# Patient Record
Sex: Female | Born: 1955
Health system: Southern US, Community
[De-identification: ages and names within clinical notes are randomized; demographics above are authoritative.]

## PROBLEM LIST (undated history)

## (undated) DIAGNOSIS — I5022 Chronic systolic (congestive) heart failure: Secondary | ICD-10-CM

## (undated) DIAGNOSIS — I5020 Unspecified systolic (congestive) heart failure: Secondary | ICD-10-CM

## (undated) DIAGNOSIS — K219 Gastro-esophageal reflux disease without esophagitis: Secondary | ICD-10-CM

## (undated) DIAGNOSIS — E119 Type 2 diabetes mellitus without complications: Secondary | ICD-10-CM

## (undated) DIAGNOSIS — I10 Essential (primary) hypertension: Secondary | ICD-10-CM

## (undated) DIAGNOSIS — E785 Hyperlipidemia, unspecified: Secondary | ICD-10-CM

## (undated) DIAGNOSIS — I729 Aneurysm of unspecified site: Secondary | ICD-10-CM

## (undated) DIAGNOSIS — F419 Anxiety disorder, unspecified: Secondary | ICD-10-CM

## (undated) DIAGNOSIS — I2510 Atherosclerotic heart disease of native coronary artery without angina pectoris: Secondary | ICD-10-CM

## (undated) DIAGNOSIS — G459 Transient cerebral ischemic attack, unspecified: Secondary | ICD-10-CM

## (undated) DIAGNOSIS — D649 Anemia, unspecified: Secondary | ICD-10-CM

## (undated) DIAGNOSIS — M1990 Unspecified osteoarthritis, unspecified site: Secondary | ICD-10-CM

## (undated) HISTORY — PX: TUBAL LIGATION: SHX77

## (undated) HISTORY — DX: Anxiety disorder, unspecified: F41.9

## (undated) HISTORY — DX: Atherosclerotic heart disease of native coronary artery without angina pectoris: I25.10

## (undated) HISTORY — DX: Aneurysm of unspecified site: I72.9

## (undated) HISTORY — DX: Chronic systolic (congestive) heart failure: I50.22

## (undated) HISTORY — PX: BACK SURGERY: SHX140

## (undated) HISTORY — DX: Unspecified systolic (congestive) heart failure: I50.20

## (undated) HISTORY — PX: LAPAROSCOPIC CHOLECYSTECTOMY: SUR755

## (undated) HISTORY — DX: Gastro-esophageal reflux disease without esophagitis: K21.9

## (undated) HISTORY — PX: ANTERIOR CERVICAL DECOMP/DISCECTOMY FUSION: SHX1161

---

## 1998-04-12 ENCOUNTER — Encounter: Admission: RE | Admit: 1998-04-12 | Discharge: 1998-04-12 | Payer: Self-pay | Admitting: *Deleted

## 1998-06-06 ENCOUNTER — Emergency Department (HOSPITAL_COMMUNITY): Admission: EM | Admit: 1998-06-06 | Discharge: 1998-06-06 | Payer: Self-pay | Admitting: Emergency Medicine

## 1999-06-02 ENCOUNTER — Emergency Department (HOSPITAL_COMMUNITY): Admission: EM | Admit: 1999-06-02 | Discharge: 1999-06-02 | Payer: Self-pay | Admitting: Emergency Medicine

## 1999-06-03 ENCOUNTER — Encounter: Payer: Self-pay | Admitting: Emergency Medicine

## 2001-04-10 ENCOUNTER — Emergency Department (HOSPITAL_COMMUNITY): Admission: EM | Admit: 2001-04-10 | Discharge: 2001-04-10 | Payer: Self-pay | Admitting: Emergency Medicine

## 2001-04-10 ENCOUNTER — Encounter: Payer: Self-pay | Admitting: Emergency Medicine

## 2001-04-22 ENCOUNTER — Encounter: Payer: Self-pay | Admitting: Emergency Medicine

## 2001-04-22 ENCOUNTER — Emergency Department (HOSPITAL_COMMUNITY): Admission: EM | Admit: 2001-04-22 | Discharge: 2001-04-22 | Payer: Self-pay | Admitting: Emergency Medicine

## 2001-08-15 ENCOUNTER — Emergency Department (HOSPITAL_COMMUNITY): Admission: EM | Admit: 2001-08-15 | Discharge: 2001-08-15 | Payer: Self-pay | Admitting: Emergency Medicine

## 2001-08-15 ENCOUNTER — Encounter: Payer: Self-pay | Admitting: Emergency Medicine

## 2001-09-01 ENCOUNTER — Encounter: Admission: RE | Admit: 2001-09-01 | Discharge: 2001-10-16 | Payer: Self-pay | Admitting: *Deleted

## 2001-11-06 ENCOUNTER — Emergency Department (HOSPITAL_COMMUNITY): Admission: EM | Admit: 2001-11-06 | Discharge: 2001-11-06 | Payer: Self-pay | Admitting: Emergency Medicine

## 2001-11-06 ENCOUNTER — Encounter: Payer: Self-pay | Admitting: Emergency Medicine

## 2001-12-03 ENCOUNTER — Encounter: Payer: Self-pay | Admitting: Gastroenterology

## 2001-12-03 ENCOUNTER — Ambulatory Visit (HOSPITAL_COMMUNITY): Admission: RE | Admit: 2001-12-03 | Discharge: 2001-12-03 | Payer: Self-pay | Admitting: Gastroenterology

## 2001-12-12 ENCOUNTER — Emergency Department (HOSPITAL_COMMUNITY): Admission: EM | Admit: 2001-12-12 | Discharge: 2001-12-12 | Payer: Self-pay | Admitting: Emergency Medicine

## 2001-12-18 ENCOUNTER — Encounter: Payer: Self-pay | Admitting: Surgery

## 2001-12-22 ENCOUNTER — Ambulatory Visit (HOSPITAL_COMMUNITY): Admission: RE | Admit: 2001-12-22 | Discharge: 2001-12-23 | Payer: Self-pay | Admitting: Surgery

## 2001-12-22 ENCOUNTER — Encounter (INDEPENDENT_AMBULATORY_CARE_PROVIDER_SITE_OTHER): Payer: Self-pay | Admitting: *Deleted

## 2002-05-15 ENCOUNTER — Emergency Department (HOSPITAL_COMMUNITY): Admission: EM | Admit: 2002-05-15 | Discharge: 2002-05-15 | Payer: Self-pay | Admitting: Emergency Medicine

## 2002-05-15 ENCOUNTER — Encounter: Payer: Self-pay | Admitting: Emergency Medicine

## 2002-06-02 ENCOUNTER — Encounter: Payer: Self-pay | Admitting: Emergency Medicine

## 2002-06-02 ENCOUNTER — Emergency Department (HOSPITAL_COMMUNITY): Admission: EM | Admit: 2002-06-02 | Discharge: 2002-06-02 | Payer: Self-pay | Admitting: Emergency Medicine

## 2002-10-13 ENCOUNTER — Emergency Department (HOSPITAL_COMMUNITY): Admission: EM | Admit: 2002-10-13 | Discharge: 2002-10-13 | Payer: Self-pay | Admitting: Emergency Medicine

## 2003-01-25 ENCOUNTER — Ambulatory Visit (HOSPITAL_COMMUNITY): Admission: RE | Admit: 2003-01-25 | Discharge: 2003-01-26 | Payer: Self-pay | Admitting: Neurosurgery

## 2003-02-18 ENCOUNTER — Inpatient Hospital Stay (HOSPITAL_COMMUNITY): Admission: AD | Admit: 2003-02-18 | Discharge: 2003-02-18 | Payer: Self-pay | Admitting: Obstetrics & Gynecology

## 2003-02-18 ENCOUNTER — Encounter: Payer: Self-pay | Admitting: Obstetrics & Gynecology

## 2003-03-25 ENCOUNTER — Ambulatory Visit (HOSPITAL_COMMUNITY): Admission: RE | Admit: 2003-03-25 | Discharge: 2003-03-25 | Payer: Self-pay | Admitting: Obstetrics

## 2003-03-25 ENCOUNTER — Encounter: Payer: Self-pay | Admitting: Obstetrics

## 2003-04-09 ENCOUNTER — Encounter (INDEPENDENT_AMBULATORY_CARE_PROVIDER_SITE_OTHER): Payer: Self-pay | Admitting: *Deleted

## 2003-04-09 ENCOUNTER — Ambulatory Visit (HOSPITAL_COMMUNITY): Admission: RE | Admit: 2003-04-09 | Discharge: 2003-04-09 | Payer: Self-pay | Admitting: General Surgery

## 2003-07-22 ENCOUNTER — Ambulatory Visit (HOSPITAL_COMMUNITY): Admission: RE | Admit: 2003-07-22 | Discharge: 2003-07-22 | Payer: Self-pay | Admitting: Neurosurgery

## 2004-04-19 ENCOUNTER — Encounter: Admission: RE | Admit: 2004-04-19 | Discharge: 2004-04-19 | Payer: Self-pay | Admitting: Nephrology

## 2004-08-23 ENCOUNTER — Emergency Department (HOSPITAL_COMMUNITY): Admission: EM | Admit: 2004-08-23 | Discharge: 2004-08-23 | Payer: Self-pay | Admitting: Emergency Medicine

## 2005-06-23 ENCOUNTER — Emergency Department (HOSPITAL_COMMUNITY): Admission: EM | Admit: 2005-06-23 | Discharge: 2005-06-24 | Payer: Self-pay | Admitting: Emergency Medicine

## 2006-01-09 ENCOUNTER — Emergency Department (HOSPITAL_COMMUNITY): Admission: EM | Admit: 2006-01-09 | Discharge: 2006-01-09 | Payer: Self-pay | Admitting: Emergency Medicine

## 2006-05-25 ENCOUNTER — Emergency Department (HOSPITAL_COMMUNITY): Admission: EM | Admit: 2006-05-25 | Discharge: 2006-05-25 | Payer: Self-pay | Admitting: Emergency Medicine

## 2006-06-20 ENCOUNTER — Emergency Department (HOSPITAL_COMMUNITY): Admission: EM | Admit: 2006-06-20 | Discharge: 2006-06-20 | Payer: Self-pay | Admitting: Emergency Medicine

## 2006-07-11 ENCOUNTER — Emergency Department (HOSPITAL_COMMUNITY): Admission: EM | Admit: 2006-07-11 | Discharge: 2006-07-11 | Payer: Self-pay | Admitting: Emergency Medicine

## 2006-07-19 ENCOUNTER — Emergency Department (HOSPITAL_COMMUNITY): Admission: EM | Admit: 2006-07-19 | Discharge: 2006-07-19 | Payer: Self-pay | Admitting: Emergency Medicine

## 2007-01-15 ENCOUNTER — Emergency Department (HOSPITAL_COMMUNITY): Admission: EM | Admit: 2007-01-15 | Discharge: 2007-01-15 | Payer: Self-pay | Admitting: Emergency Medicine

## 2007-12-12 ENCOUNTER — Emergency Department (HOSPITAL_COMMUNITY): Admission: EM | Admit: 2007-12-12 | Discharge: 2007-12-13 | Payer: Self-pay | Admitting: Emergency Medicine

## 2007-12-17 ENCOUNTER — Emergency Department (HOSPITAL_COMMUNITY): Admission: EM | Admit: 2007-12-17 | Discharge: 2007-12-17 | Payer: Self-pay | Admitting: Emergency Medicine

## 2007-12-20 ENCOUNTER — Emergency Department (HOSPITAL_COMMUNITY): Admission: EM | Admit: 2007-12-20 | Discharge: 2007-12-21 | Payer: Self-pay | Admitting: Emergency Medicine

## 2008-07-03 ENCOUNTER — Emergency Department (HOSPITAL_COMMUNITY): Admission: EM | Admit: 2008-07-03 | Discharge: 2008-07-03 | Payer: Self-pay | Admitting: Emergency Medicine

## 2008-12-25 ENCOUNTER — Inpatient Hospital Stay (HOSPITAL_COMMUNITY): Admission: EM | Admit: 2008-12-25 | Discharge: 2008-12-27 | Payer: Self-pay | Admitting: Emergency Medicine

## 2009-01-28 ENCOUNTER — Emergency Department (HOSPITAL_COMMUNITY): Admission: EM | Admit: 2009-01-28 | Discharge: 2009-01-28 | Payer: Self-pay | Admitting: Emergency Medicine

## 2009-03-09 ENCOUNTER — Emergency Department (HOSPITAL_COMMUNITY): Admission: EM | Admit: 2009-03-09 | Discharge: 2009-03-09 | Payer: Self-pay | Admitting: Emergency Medicine

## 2009-03-13 ENCOUNTER — Emergency Department (HOSPITAL_COMMUNITY): Admission: EM | Admit: 2009-03-13 | Discharge: 2009-03-13 | Payer: Self-pay | Admitting: Emergency Medicine

## 2009-05-02 ENCOUNTER — Inpatient Hospital Stay (HOSPITAL_COMMUNITY): Admission: EM | Admit: 2009-05-02 | Discharge: 2009-05-05 | Payer: Self-pay | Admitting: Emergency Medicine

## 2010-05-12 ENCOUNTER — Emergency Department (HOSPITAL_COMMUNITY): Admission: EM | Admit: 2010-05-12 | Discharge: 2010-05-12 | Payer: Self-pay | Admitting: Emergency Medicine

## 2010-05-15 ENCOUNTER — Emergency Department (HOSPITAL_COMMUNITY): Admission: EM | Admit: 2010-05-15 | Discharge: 2010-05-15 | Payer: Self-pay | Admitting: Emergency Medicine

## 2010-05-19 ENCOUNTER — Emergency Department (HOSPITAL_COMMUNITY): Admission: EM | Admit: 2010-05-19 | Discharge: 2010-05-19 | Payer: Self-pay | Admitting: Family Medicine

## 2010-06-20 ENCOUNTER — Emergency Department (HOSPITAL_COMMUNITY)
Admission: EM | Admit: 2010-06-20 | Discharge: 2010-06-20 | Payer: Self-pay | Source: Home / Self Care | Admitting: Emergency Medicine

## 2010-07-15 ENCOUNTER — Encounter: Payer: Self-pay | Admitting: Nephrology

## 2010-09-05 LAB — BASIC METABOLIC PANEL
BUN: 20 mg/dL (ref 6–23)
CO2: 25 mEq/L (ref 19–32)
Calcium: 9.5 mg/dL (ref 8.4–10.5)
Chloride: 108 mEq/L (ref 96–112)
Creatinine, Ser: 0.6 mg/dL (ref 0.4–1.2)
GFR calc Af Amer: >60 mL/min (ref >60)
GFR calc non Af Amer: >60 mL/min (ref >60)
Glucose, Bld: 125 mg/dL — ABNORMAL HIGH (ref 70–99)
Potassium: 3.7 mEq/L (ref 3.5–5.1)
Sodium: 142 mEq/L (ref 135–145)

## 2010-09-05 LAB — GLUCOSE, CAPILLARY: Glucose-Capillary: 119 mg/dL — ABNORMAL HIGH (ref 70–99)

## 2010-09-13 ENCOUNTER — Emergency Department (HOSPITAL_COMMUNITY)
Admission: EM | Admit: 2010-09-13 | Discharge: 2010-09-13 | Disposition: A | Discharge status: Home / Self Care | Payer: Self-pay | Attending: Emergency Medicine | Admitting: Emergency Medicine

## 2010-09-13 DIAGNOSIS — R51 Headache: Secondary | ICD-10-CM | POA: Insufficient documentation

## 2010-09-13 DIAGNOSIS — E785 Hyperlipidemia, unspecified: Secondary | ICD-10-CM | POA: Insufficient documentation

## 2010-09-13 DIAGNOSIS — I10 Essential (primary) hypertension: Secondary | ICD-10-CM | POA: Insufficient documentation

## 2010-09-27 LAB — CBC
HCT: 31.8 % — ABNORMAL LOW (ref 36.0–46.0)
HCT: 33.1 % — ABNORMAL LOW (ref 36.0–46.0)
HCT: 33.5 % — ABNORMAL LOW (ref 36.0–46.0)
HCT: 34.3 % — ABNORMAL LOW (ref 36.0–46.0)
Hemoglobin: 10.7 g/dL — ABNORMAL LOW (ref 12.0–15.0)
Hemoglobin: 11.1 g/dL — ABNORMAL LOW (ref 12.0–15.0)
Hemoglobin: 11.2 g/dL — ABNORMAL LOW (ref 12.0–15.0)
Hemoglobin: 11.2 g/dL — ABNORMAL LOW (ref 12.0–15.0)
MCHC: 32.6 g/dL (ref 30.0–36.0)
MCHC: 33.1 g/dL (ref 30.0–36.0)
MCHC: 33.6 g/dL (ref 30.0–36.0)
MCHC: 33.8 g/dL (ref 30.0–36.0)
MCV: 84.6 fL (ref 78.0–100.0)
MCV: 85.3 fL (ref 78.0–100.0)
MCV: 85.9 fL (ref 78.0–100.0)
MCV: 86.6 fL (ref 78.0–100.0)
Platelets: 277 10*3/uL (ref 150–400)
Platelets: 280 10*3/uL (ref 150–400)
Platelets: 284 10*3/uL (ref 150–400)
Platelets: 293 10*3/uL (ref 150–400)
RBC: 3.72 MIL/uL — ABNORMAL LOW (ref 3.87–5.11)
RBC: 3.87 MIL/uL (ref 3.87–5.11)
RBC: 3.91 MIL/uL (ref 3.87–5.11)
RBC: 3.99 MIL/uL (ref 3.87–5.11)
RDW: 12.6 % (ref 11.5–15.5)
RDW: 13.1 % (ref 11.5–15.5)
RDW: 13.2 % (ref 11.5–15.5)
RDW: 13.2 % (ref 11.5–15.5)
WBC: 8.4 10*3/uL (ref 4.0–10.5)
WBC: 8.6 10*3/uL (ref 4.0–10.5)
WBC: 9.2 10*3/uL (ref 4.0–10.5)
WBC: 9.3 10*3/uL (ref 4.0–10.5)

## 2010-09-27 LAB — URINALYSIS, ROUTINE W REFLEX MICROSCOPIC
Bilirubin Urine: NEGATIVE
Glucose, UA: NEGATIVE mg/dL
Hgb urine dipstick: NEGATIVE
Ketones, ur: NEGATIVE mg/dL
Nitrite: NEGATIVE
Protein, ur: NEGATIVE mg/dL
Specific Gravity, Urine: 1.009 (ref 1.005–1.030)
Urobilinogen, UA: 0.2 mg/dL (ref 0.0–1.0)
pH: 6.5 (ref 5.0–8.0)

## 2010-09-27 LAB — RENAL FUNCTION PANEL
Albumin: 3.4 g/dL — ABNORMAL LOW (ref 3.5–5.2)
Albumin: 3.5 g/dL (ref 3.5–5.2)
BUN: 10 mg/dL (ref 6–23)
BUN: 13 mg/dL (ref 6–23)
CO2: 28 mEq/L (ref 19–32)
CO2: 29 mEq/L (ref 19–32)
Calcium: 9.2 mg/dL (ref 8.4–10.5)
Calcium: 9.2 mg/dL (ref 8.4–10.5)
Chloride: 104 mEq/L (ref 96–112)
Chloride: 105 mEq/L (ref 96–112)
Creatinine, Ser: 0.57 mg/dL (ref 0.4–1.2)
Creatinine, Ser: 0.66 mg/dL (ref 0.4–1.2)
GFR calc Af Amer: >60 mL/min (ref >60)
GFR calc Af Amer: >60 mL/min (ref >60)
GFR calc non Af Amer: >60 mL/min (ref >60)
GFR calc non Af Amer: >60 mL/min (ref >60)
Glucose, Bld: 122 mg/dL — ABNORMAL HIGH (ref 70–99)
Glucose, Bld: 130 mg/dL — ABNORMAL HIGH (ref 70–99)
Phosphorus: 4.5 mg/dL (ref 2.3–4.6)
Phosphorus: 4.8 mg/dL — ABNORMAL HIGH (ref 2.3–4.6)
Potassium: 3.6 mEq/L (ref 3.5–5.1)
Potassium: 3.7 mEq/L (ref 3.5–5.1)
Sodium: 139 mEq/L (ref 135–145)
Sodium: 140 mEq/L (ref 135–145)

## 2010-09-27 LAB — BASIC METABOLIC PANEL
BUN: 11 mg/dL (ref 6–23)
CO2: 26 mEq/L (ref 19–32)
Calcium: 9.1 mg/dL (ref 8.4–10.5)
Chloride: 103 mEq/L (ref 96–112)
Creatinine, Ser: 0.59 mg/dL (ref 0.4–1.2)
GFR calc Af Amer: >60 mL/min (ref >60)
GFR calc non Af Amer: >60 mL/min (ref >60)
Glucose, Bld: 98 mg/dL (ref 70–99)
Potassium: 3.2 mEq/L — ABNORMAL LOW (ref 3.5–5.1)
Sodium: 141 mEq/L (ref 135–145)

## 2010-09-27 LAB — DIFFERENTIAL
Basophils Absolute: 0 10*3/uL (ref 0.0–0.1)
Basophils Absolute: 0 10*3/uL (ref 0.0–0.1)
Basophils Absolute: 0 10*3/uL (ref 0.0–0.1)
Basophils Absolute: 0.1 10*3/uL (ref 0.0–0.1)
Basophils Relative: 0 % (ref 0–1)
Basophils Relative: 0 % (ref 0–1)
Basophils Relative: 1 % (ref 0–1)
Basophils Relative: 1 % (ref 0–1)
Eosinophils Absolute: 0.1 10*3/uL (ref 0.0–0.7)
Eosinophils Absolute: 0.1 10*3/uL (ref 0.0–0.7)
Eosinophils Absolute: 0.1 10*3/uL (ref 0.0–0.7)
Eosinophils Absolute: 0.2 10*3/uL (ref 0.0–0.7)
Eosinophils Relative: 1 % (ref 0–5)
Eosinophils Relative: 2 % (ref 0–5)
Eosinophils Relative: 2 % (ref 0–5)
Eosinophils Relative: 2 % (ref 0–5)
Lymphocytes Relative: 27 % (ref 12–46)
Lymphocytes Relative: 32 % (ref 12–46)
Lymphocytes Relative: 35 % (ref 12–46)
Lymphocytes Relative: 37 % (ref 12–46)
Lymphs Abs: 2.3 10*3/uL (ref 0.7–4.0)
Lymphs Abs: 2.7 10*3/uL (ref 0.7–4.0)
Lymphs Abs: 3.2 10*3/uL (ref 0.7–4.0)
Lymphs Abs: 3.4 10*3/uL (ref 0.7–4.0)
Monocytes Absolute: 0.4 10*3/uL (ref 0.1–1.0)
Monocytes Absolute: 0.5 10*3/uL (ref 0.1–1.0)
Monocytes Absolute: 0.5 10*3/uL (ref 0.1–1.0)
Monocytes Absolute: 0.5 10*3/uL (ref 0.1–1.0)
Monocytes Relative: 5 % (ref 3–12)
Monocytes Relative: 5 % (ref 3–12)
Monocytes Relative: 5 % (ref 3–12)
Monocytes Relative: 6 % (ref 3–12)
Neutro Abs: 5.2 10*3/uL (ref 1.7–7.7)
Neutro Abs: 5.2 10*3/uL (ref 1.7–7.7)
Neutro Abs: 5.4 10*3/uL (ref 1.7–7.7)
Neutro Abs: 5.5 10*3/uL (ref 1.7–7.7)
Neutrophils Relative %: 56 % (ref 43–77)
Neutrophils Relative %: 59 % (ref 43–77)
Neutrophils Relative %: 60 % (ref 43–77)
Neutrophils Relative %: 66 % (ref 43–77)

## 2010-09-27 LAB — COMPREHENSIVE METABOLIC PANEL
ALT: 15 U/L (ref 0–35)
AST: 17 U/L (ref 0–37)
Albumin: 3.5 g/dL (ref 3.5–5.2)
Alkaline Phosphatase: 76 U/L (ref 39–117)
BUN: 11 mg/dL (ref 6–23)
CO2: 28 mEq/L (ref 19–32)
Calcium: 8.8 mg/dL (ref 8.4–10.5)
Chloride: 108 mEq/L (ref 96–112)
Creatinine, Ser: 0.53 mg/dL (ref 0.4–1.2)
GFR calc Af Amer: >60 mL/min (ref >60)
GFR calc non Af Amer: >60 mL/min (ref >60)
Glucose, Bld: 130 mg/dL — ABNORMAL HIGH (ref 70–99)
Potassium: 3 mEq/L — ABNORMAL LOW (ref 3.5–5.1)
Sodium: 141 mEq/L (ref 135–145)
Total Bilirubin: 0.4 mg/dL (ref 0.3–1.2)
Total Protein: 6.4 g/dL (ref 6.0–8.3)

## 2010-09-27 LAB — LIPID PANEL
Cholesterol: 220 mg/dL — ABNORMAL HIGH (ref 0–200)
HDL: 48 mg/dL (ref >39)
LDL Cholesterol: 140 mg/dL — ABNORMAL HIGH (ref 0–99)
Total CHOL/HDL Ratio: 4.6 RATIO
Triglycerides: 160 mg/dL — ABNORMAL HIGH (ref <150)
VLDL: 32 mg/dL (ref 0–40)

## 2010-09-27 LAB — POCT CARDIAC MARKERS
CKMB, poc: <1 ng/mL — ABNORMAL LOW (ref 1.0–8.0)
CKMB, poc: <1 ng/mL — ABNORMAL LOW (ref 1.0–8.0)
Myoglobin, poc: 41.5 ng/mL (ref 12–200)
Myoglobin, poc: 43.9 ng/mL (ref 12–200)
Troponin i, poc: <0.05 ng/mL (ref 0.00–0.09)
Troponin i, poc: <0.05 ng/mL (ref 0.00–0.09)

## 2010-09-27 LAB — TROPONIN I
Troponin I: 0.01 ng/mL (ref 0.00–0.06)
Troponin I: 0.02 ng/mL (ref 0.00–0.06)
Troponin I: 0.02 ng/mL (ref 0.00–0.06)
Troponin I: <0.01 ng/mL (ref 0.00–0.06)
Troponin I: <0.01 ng/mL (ref 0.00–0.06)
Troponin I: <0.01 ng/mL (ref 0.00–0.06)

## 2010-09-27 LAB — CK TOTAL AND CKMB (NOT AT ARMC)
CK, MB: 0.7 ng/mL (ref 0.3–4.0)
CK, MB: 0.7 ng/mL (ref 0.3–4.0)
CK, MB: 0.7 ng/mL (ref 0.3–4.0)
CK, MB: 0.7 ng/mL (ref 0.3–4.0)
CK, MB: 0.8 ng/mL (ref 0.3–4.0)
CK, MB: 1.2 ng/mL (ref 0.3–4.0)
Relative Index: INVALID (ref 0.0–2.5)
Relative Index: INVALID (ref 0.0–2.5)
Relative Index: INVALID (ref 0.0–2.5)
Relative Index: INVALID (ref 0.0–2.5)
Relative Index: INVALID (ref 0.0–2.5)
Relative Index: INVALID (ref 0.0–2.5)
Total CK: 53 U/L (ref 7–177)
Total CK: 56 U/L (ref 7–177)
Total CK: 57 U/L (ref 7–177)
Total CK: 58 U/L (ref 7–177)
Total CK: 61 U/L (ref 7–177)
Total CK: 65 U/L (ref 7–177)

## 2010-09-27 LAB — URINE MICROSCOPIC-ADD ON

## 2010-09-27 LAB — AMYLASE: Amylase: 48 U/L (ref 27–131)

## 2010-09-29 LAB — BASIC METABOLIC PANEL
BUN: 11 mg/dL (ref 6–23)
CO2: 26 mEq/L (ref 19–32)
Calcium: 9.6 mg/dL (ref 8.4–10.5)
Chloride: 104 mEq/L (ref 96–112)
Creatinine, Ser: 0.62 mg/dL (ref 0.4–1.2)
GFR calc Af Amer: >60 mL/min (ref >60)
GFR calc non Af Amer: >60 mL/min (ref >60)
Glucose, Bld: 141 mg/dL — ABNORMAL HIGH (ref 70–99)
Potassium: 3.5 mEq/L (ref 3.5–5.1)
Sodium: 138 mEq/L (ref 135–145)

## 2010-09-29 LAB — CBC
HCT: 40.5 % (ref 36.0–46.0)
Hemoglobin: 13.4 g/dL (ref 12.0–15.0)
MCHC: 33.1 g/dL (ref 30.0–36.0)
MCV: 86.3 fL (ref 78.0–100.0)
Platelets: 306 10*3/uL (ref 150–400)
RBC: 4.69 MIL/uL (ref 3.87–5.11)
RDW: 13.5 % (ref 11.5–15.5)
WBC: 9 10*3/uL (ref 4.0–10.5)

## 2010-09-29 LAB — DIFFERENTIAL
Basophils Absolute: 0 10*3/uL (ref 0.0–0.1)
Basophils Relative: 0 % (ref 0–1)
Eosinophils Absolute: 0.1 10*3/uL (ref 0.0–0.7)
Eosinophils Relative: 1 % (ref 0–5)
Lymphocytes Relative: 30 % (ref 12–46)
Lymphs Abs: 2.6 10*3/uL (ref 0.7–4.0)
Monocytes Absolute: 0.4 10*3/uL (ref 0.1–1.0)
Monocytes Relative: 5 % (ref 3–12)
Neutro Abs: 5.7 10*3/uL (ref 1.7–7.7)
Neutrophils Relative %: 64 % (ref 43–77)

## 2010-09-29 LAB — URINALYSIS, ROUTINE W REFLEX MICROSCOPIC
Bilirubin Urine: NEGATIVE
Glucose, UA: NEGATIVE mg/dL
Hgb urine dipstick: NEGATIVE
Ketones, ur: NEGATIVE mg/dL
Nitrite: NEGATIVE
Protein, ur: NEGATIVE mg/dL
Specific Gravity, Urine: 1.009 (ref 1.005–1.030)
Urobilinogen, UA: 0.2 mg/dL (ref 0.0–1.0)
pH: 7.5 (ref 5.0–8.0)

## 2010-09-29 LAB — POCT CARDIAC MARKERS
CKMB, poc: 1.3 ng/mL (ref 1.0–8.0)
CKMB, poc: 1.2 ng/mL (ref 1.0–8.0)
CKMB, poc: <1 ng/mL — ABNORMAL LOW (ref 1.0–8.0)
Myoglobin, poc: 67.8 ng/mL (ref 12–200)
Myoglobin, poc: 106 ng/mL (ref 12–200)
Myoglobin, poc: 57.1 ng/mL (ref 12–200)
Troponin i, poc: <0.05 ng/mL (ref 0.00–0.09)
Troponin i, poc: <0.05 ng/mL (ref 0.00–0.09)
Troponin i, poc: <0.05 ng/mL (ref 0.00–0.09)

## 2010-09-29 LAB — POCT I-STAT, CHEM 8
BUN: 9 mg/dL (ref 6–23)
Calcium, Ion: 1.19 mmol/L (ref 1.12–1.32)
Chloride: 107 mEq/L (ref 96–112)
Creatinine, Ser: 0.5 mg/dL (ref 0.4–1.2)
Glucose, Bld: 107 mg/dL — ABNORMAL HIGH (ref 70–99)
HCT: 38 % (ref 36.0–46.0)
Hemoglobin: 12.9 g/dL (ref 12.0–15.0)
Potassium: 3.8 mEq/L (ref 3.5–5.1)
Sodium: 142 mEq/L (ref 135–145)
TCO2: 27 mmol/L (ref 0–100)

## 2010-10-01 LAB — POCT CARDIAC MARKERS
CKMB, poc: <1 ng/mL — ABNORMAL LOW (ref 1.0–8.0)
CKMB, poc: <1 ng/mL — ABNORMAL LOW (ref 1.0–8.0)
Myoglobin, poc: 29.5 ng/mL (ref 12–200)
Myoglobin, poc: 42.4 ng/mL (ref 12–200)
Troponin i, poc: <0.05 ng/mL (ref 0.00–0.09)
Troponin i, poc: <0.05 ng/mL (ref 0.00–0.09)

## 2010-10-01 LAB — POCT I-STAT, CHEM 8
BUN: 10 mg/dL (ref 6–23)
Calcium, Ion: 1.16 mmol/L (ref 1.12–1.32)
Chloride: 104 mEq/L (ref 96–112)
Creatinine, Ser: 0.7 mg/dL (ref 0.4–1.2)
Glucose, Bld: 98 mg/dL (ref 70–99)
HCT: 43 % (ref 36.0–46.0)
Hemoglobin: 14.6 g/dL (ref 12.0–15.0)
Potassium: 3.6 mEq/L (ref 3.5–5.1)
Sodium: 142 mEq/L (ref 135–145)
TCO2: 28 mmol/L (ref 0–100)

## 2010-10-01 LAB — LIPID PANEL
Cholesterol: 197 mg/dL (ref 0–200)
HDL: 41 mg/dL (ref >39)
LDL Cholesterol: 120 mg/dL — ABNORMAL HIGH (ref 0–99)
Total CHOL/HDL Ratio: 4.8 RATIO
Triglycerides: 179 mg/dL — ABNORMAL HIGH (ref <150)
VLDL: 36 mg/dL (ref 0–40)

## 2010-10-01 LAB — CBC
HCT: 34.8 % — ABNORMAL LOW (ref 36.0–46.0)
HCT: 40.2 % (ref 36.0–46.0)
Hemoglobin: 11.2 g/dL — ABNORMAL LOW (ref 12.0–15.0)
Hemoglobin: 13.2 g/dL (ref 12.0–15.0)
MCHC: 32.2 g/dL (ref 30.0–36.0)
MCHC: 32.9 g/dL (ref 30.0–36.0)
MCV: 85.7 fL (ref 78.0–100.0)
MCV: 86.2 fL (ref 78.0–100.0)
Platelets: 266 10*3/uL (ref 150–400)
Platelets: 323 10*3/uL (ref 150–400)
RBC: 4.04 MIL/uL (ref 3.87–5.11)
RBC: 4.69 MIL/uL (ref 3.87–5.11)
RDW: 12.7 % (ref 11.5–15.5)
RDW: 13.5 % (ref 11.5–15.5)
WBC: 8.6 10*3/uL (ref 4.0–10.5)
WBC: 9.2 10*3/uL (ref 4.0–10.5)

## 2010-10-01 LAB — DIFFERENTIAL
Basophils Absolute: 0.1 10*3/uL (ref 0.0–0.1)
Basophils Relative: 1 % (ref 0–1)
Eosinophils Absolute: 0.1 10*3/uL (ref 0.0–0.7)
Eosinophils Relative: 1 % (ref 0–5)
Lymphocytes Relative: 34 % (ref 12–46)
Lymphs Abs: 3.2 10*3/uL (ref 0.7–4.0)
Monocytes Absolute: 0.4 10*3/uL (ref 0.1–1.0)
Monocytes Relative: 4 % (ref 3–12)
Neutro Abs: 5.4 10*3/uL (ref 1.7–7.7)
Neutrophils Relative %: 59 % (ref 43–77)

## 2010-10-01 LAB — URINALYSIS, ROUTINE W REFLEX MICROSCOPIC
Bilirubin Urine: NEGATIVE
Glucose, UA: NEGATIVE mg/dL
Hgb urine dipstick: NEGATIVE
Ketones, ur: NEGATIVE mg/dL
Nitrite: NEGATIVE
Protein, ur: NEGATIVE mg/dL
Specific Gravity, Urine: 1.003 — ABNORMAL LOW (ref 1.005–1.030)
Urobilinogen, UA: 0.2 mg/dL (ref 0.0–1.0)
pH: 7 (ref 5.0–8.0)

## 2010-10-01 LAB — COMPREHENSIVE METABOLIC PANEL
ALT: 14 U/L (ref 0–35)
AST: 20 U/L (ref 0–37)
Albumin: 3.5 g/dL (ref 3.5–5.2)
Alkaline Phosphatase: 61 U/L (ref 39–117)
BUN: 8 mg/dL (ref 6–23)
CO2: 27 mEq/L (ref 19–32)
Calcium: 9.1 mg/dL (ref 8.4–10.5)
Chloride: 110 mEq/L (ref 96–112)
Creatinine, Ser: 0.7 mg/dL (ref 0.4–1.2)
GFR calc Af Amer: >60 mL/min (ref >60)
GFR calc non Af Amer: >60 mL/min (ref >60)
Glucose, Bld: 123 mg/dL — ABNORMAL HIGH (ref 70–99)
Potassium: 3.4 mEq/L — ABNORMAL LOW (ref 3.5–5.1)
Sodium: 144 mEq/L (ref 135–145)
Total Bilirubin: 0.4 mg/dL (ref 0.3–1.2)
Total Protein: 6.2 g/dL (ref 6.0–8.3)

## 2010-10-01 LAB — URINALYSIS, MICROSCOPIC ONLY
Bilirubin Urine: NEGATIVE
Glucose, UA: NEGATIVE mg/dL
Hgb urine dipstick: NEGATIVE
Ketones, ur: NEGATIVE mg/dL
Leukocytes, UA: NEGATIVE
Nitrite: NEGATIVE
Protein, ur: NEGATIVE mg/dL
Specific Gravity, Urine: 1.006 (ref 1.005–1.030)
Urine-Other: NONE SEEN
Urobilinogen, UA: 0.2 mg/dL (ref 0.0–1.0)
pH: 6 (ref 5.0–8.0)

## 2010-10-01 LAB — CK TOTAL AND CKMB (NOT AT ARMC)
CK, MB: 0.7 ng/mL (ref 0.3–4.0)
Relative Index: INVALID (ref 0.0–2.5)
Total CK: 64 U/L (ref 7–177)

## 2010-10-01 LAB — PROTIME-INR
INR: 1 (ref 0.00–1.49)
Prothrombin Time: 13.1 seconds (ref 11.6–15.2)

## 2010-10-01 LAB — APTT: aPTT: 27 seconds (ref 24–37)

## 2010-10-01 LAB — TROPONIN I: Troponin I: <0.01 ng/mL (ref 0.00–0.06)

## 2010-10-01 LAB — URINE CULTURE
Colony Count: NO GROWTH
Culture: NO GROWTH

## 2010-10-01 LAB — HEMOGLOBIN A1C
Hgb A1c MFr Bld: 6.7 % — ABNORMAL HIGH (ref 4.6–6.1)
Mean Plasma Glucose: 146 mg/dL

## 2010-11-10 NOTE — Op Note (Signed)
NAME:  [name], [name]                       ACCOUNT NO.:  [id number]   MEDICAL RECORD NO.:  [id number]                   PATIENT TYPE:  OIB   LOCATION:  2894                                 FACILITY:  [facility]   PHYSICIAN:  Leonie Man, M.D.                DATE OF BIRTH:  Aug 31, 1955   DATE OF PROCEDURE:  04/09/2003  DATE OF DISCHARGE:  04/09/2003                                 OPERATIVE REPORT   PREOPERATIVE DIAGNOSIS:  Incarcerated ventral/epigastric hernia.   POSTOPERATIVE DIAGNOSIS:  Incarcerated ventral/epigastric hernia.   PROCEDURE:  Repair of incarcerated ventral/epigastric hernia with mesh.   SURGEON:  Leonie Man, M.D.   ASSISTANT:  Nurse.   ANESTHESIA:  General.   INDICATIONS FOR PROCEDURE:  This patient is a 55 year old woman presenting  with a mass and epigastric pain.  She is status post laparoscopic  cholecystectomy in the past.  On palpation, the mass is tender and is an  epigastric/ventral hernia which is incarcerated.   The patient is brought to the operating room now after the risks and  potential benefits of surgery having been fully discussed, all questions  answered, and consent is obtained.   DESCRIPTION OF PROCEDURE:  Following the induction of satisfactory general  anesthesia with the patient positioned supinely, the abdomen is routinely  prepped and draped to be included in the sterile operative field.  The  umbilical mass which is approximately 3 fingerbreadths above the umbilicus  is approached through a midline incision through the skin and subcutaneous  tissues carrying the dissection down to the mass.  The mass is dissected  free, and it is, in fact, somewhat contiguous with the umbilicus.  The mass  is dissected free from the surrounding fascia.  The fascia is opened, and  the sac containing the mass is opened.  This is dissected free.  The round  ligament is then clamped and transected and secured with ties of 2-0 silk.  The edge of  the defect were cleared, and I then placed a Seprafilm slurry  into the peritoneal cavity so as to prevent adhesions to the mesh.  The mesh  plug was then put in place and sutured in with interrupted 0 Novofil  sutures.  The repair was noted to be intact.  Sponge, instrument, and sharp  counts were verified.  The subcutaneous tissues were then closed with a  running 2-0 Vicryl suture.  The skin was closed with a running 4-0 Monocryl  suture placed intradermally.  The wound was then reinforced with Steri-  Strips, and sterile dressings were applied.  The anesthetic was reversed.  The patient was removed from the operating room to the recovery room in  stable condition.  She tolerated the procedure well.  Leonie Man, M.D.    PB/MEDQ  D:  04/09/2003  T:  04/09/2003  Job:  [id number]

## 2010-11-10 NOTE — Op Note (Signed)
[place]  Patient:    [name], [name] Visit Number: [id number] MRN: [id number]          Service Type: DSU Location: [id number] Attending Physician:  Shelly Rubenstein Dictated by:   Abigail Miyamoto, M.D. Proc. Date: 12/22/01 Admit Date:  12/22/2001 Discharge Date: 12/23/2001                             Operative Report  PREOPERATIVE DIAGNOSIS:  Symptomatic cholelithiasis.  POSTOPERATIVE DIAGNOSIS:  Symptomatic cholelithiasis.  PROCEDURE:  Laparoscopic cholecystectomy.  SURGEON:  Abigail Miyamoto, M.D.  ASSISTANT:  Velora Heckler, M.D.  ANESTHESIA:  General endotracheal anesthesia.  ESTIMATED BLOOD LOSS:  Minimal.  DESCRIPTION OF PROCEDURE:  The patient brought to the operating room and identified as [name].  She was placed supine on the operating room table, and general anesthesia was induced.  Her abdomen was then prepped and draped in the usual sterile fashion.  Using a #15 blade, a small transverse incision was made below the umbilicus.  The incision was carried down through the fascia, which was then opened with a scalpel.  A hemostat was then used to pass to the peritoneal cavity.  A 0 Vicryl pursestring suture was then placed around the fascial opening.  The Hasson port was placed in the opening, and the insufflation of the abdomen was begun.  An 11 mm port was placed in the patients epigastrium and two 5 mm ports placed in the patients right flank under direct vision.  The gallbladder was then grasped and retracted above the liver bed.  The cystic duct and artery were found to be intimately involved with each other but were easily dissected out and then clipped three times proximally and once distally and transected with the scissors.  The gallbladder was then slowly dissected free from the liver bed with the electrocautery.  The gallbladder was injured during the dissection, so it was placed in an Endosac  after removal.  The liver bed was examined and hemostasis was achieved.  The gallbladder was then removed through the incision at the umbilicus.  The 0 Vicryl at the umbilicus was then tied in place, closing the fascial defect.  Next the abdomen was copiously irrigated with normal saline. Again hemostasis appeared to be achieved.  All ports were then removed under direct vision, and the abdomen was deflated.  All incisions were then anesthetized with 0.25% Marcaine and closed with 4-0 Monocryl subcuticular sutures.  Steri-Strips, gauze, and tape were then applied.  The patient tolerated the procedure well.  All sponge, needle, and instrument counts were correct at the end of the procedure.  The patient was then extubated in the operating room and taken in stable condition to recovery. Dictated by:   Abigail Miyamoto, M.D. Attending Physician:  Shelly Rubenstein DD:  12/22/01 TD:  12/24/01 Job: [id number]

## 2010-11-10 NOTE — Op Note (Signed)
NAME:  [name], [name]                       ACCOUNT NO.:  [id number]   MEDICAL RECORD NO.:  [id number]                   PATIENT TYPE:  OIB   LOCATION:  2899                                 FACILITY:  [facility]   PHYSICIAN:  Cristi Loron, M.D.            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   OPERATION PERFORMED:  Right carpal tunnel release (median nerve neurolysis  at the wrist).   SURGEON:  Cristi Loron, M.D.   ASSISTANT:  None.   ANESTHESIA:  Local.   ESTIMATED BLOOD LOSS:  Minimal.   SPECIMENS:  None.   DRAINS:  None.   COMPLICATIONS:  None.   INDICATIONS FOR PROCEDURE:  The patient is a 55 year old black female who  has suffered from right hand numbness consistent with carpal tunnel  syndrome.  She was worked up with NCV's which confirmed this diagnosis.  She  has filed Risk analyst.  She therefore weighed the risks, benefits and  alternatives to surgery and decided to proceed with carpal tunnel release on  the right.   DESCRIPTION OF PROCEDURE:  The patient was brought to the operating room by  the nursing team.  Her right hand and arm were prepared with Betadine scrub  and Betadine solution and sterile drapes were applied.  I then injected the  area to be incised with Marcaine solution and then used a 15 blade scalpel  to make an incision in the patient's right palmar crease.  I then divided  the superficial fascia with the scalpel and then divided the deep fascia  i.e. the transverse carpal ligament both proximally and distally and  decompressing the underlying median nerve widely staying on the ulnar nerve  aspect of the median nerve.  I then once  satisfied with decompression, I  achieved hemostasis using bipolar electrocautery and then removed the  retractors and reapproximated the patient's skin with a running 3-0 nylon  suture.  The wound was then coated with bacitracin ointment and sterile  dressings applied.  The drapes were removed.  The patient was subsequently  removed from the operating room by the nursing team.  All sponge and needle  counts were correct at the end of this case.                                               Cristi Loron, M.D.   JDJ/MEDQ  D:  07/22/2003  T:  07/23/2003  Job:  [id number]

## 2010-11-10 NOTE — Op Note (Signed)
NAME:  [name], [name]                       ACCOUNT NO.:  [id number]   MEDICAL RECORD NO.:  [id number]                   PATIENT TYPE:  OIB   LOCATION:  3008                                 FACILITY:  [facility]   PHYSICIAN:  Cristi Loron, M.D.            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   OPERATION PERFORMED:  C6-7 extensive anterior cervical  diskectomy/decompression; interbody iliac crest allograft arthrodesis;  anterior cervical plating (Synthes titanium plate and screws).   SURGEON:  Cristi Loron, M.D.   ASSISTANT:  Hewitt Shorts, M.D.   ANESTHESIA:  General endotracheal.   ESTIMATED BLOOD LOSS:  .   SPECIMENS:  None.   DRAINS:  None.   COMPLICATIONS:  None.   INDICATIONS FOR PROCEDURE:  The patient is a 55 year old black female who  has suffered from neck and left arm pain and numbness consistent with a left  C7 radiculopathy.  She failed medical management and was worked up with a  cervical MRI which demonstrated a herniated disk and spondylosis at C6-7.  I  discussed the various treatment options with her including surgery.  The  patient weighed the risks, benefits and alternatives to surgery and decided  to proceed with a C6-7 anterior cervical diskectomy, fusion and plating.   DESCRIPTION OF PROCEDURE:  The patient was brought to the operating room by  the anesthesia team.  General endotracheal anesthesia was induced.  The  patient remained in supine position.  A roll was placed under her shoulders  to place her neck in slight extension.  Her anterior cervical region was  then prepared with Betadine scrub and Betadine solution.   Sterile drapes  were applied.  I then injected the area to be incised with Marcaine with  epinephrine solution and used the scalpel to make an incision in the  patient's left anterior neck.  I used the Metzenbaum scissors to divide the  platysma muscle and then to dissect medial to the sternocleidomastoid  muscle, jugular vein and carotid artery.  I bluntly dissected down toward  the anterior cervical spine, carefully identifying the esophagus and  retracting it medially.  I used the Kitner swabs to clear the soft tissue  from the anterior cervical spine.  I inserted a bent spinal needle into the  exposed interspace.  I obtained an intraoperative radiograph to confirm our  location.   We then used the electrocautery to detach the medial border of the longus  colli muscle bilaterally from the C6-7 intervertebral disk space.  We then  inserted the Caspar self-retaining retractor for exposure and then used the  15 blade scalpel to incise the C6-7 intervertebral disk.  We  performed a  partial diskectomy using the pituitary forceps and the Carlens curets.  We  then used a high speed drill to decorticate the vertebral end plates at C6-  7, drill away the remainder of the intervertebral disk and thinning out the  posterior longitudinal ligament.  We incised the ligament with the arachnoid  knife and then removed it with a Kerrison punch undercutting the vertebral  end plates, decompressing the thecal sac.  We performed the foraminotomy  about the bilateral C7 nerve roots.  There was significant narrowing  bilaterally from combination of bulging and spondylosis.   Having completed decompression, we now turned our attention to the  arthrodesis.  We obtained iliac crest, tricortical allograft bone graft and  fashioned it to these approximate dimensions.  7 mm in height, 1 cm in  depth.  We did insert distraction screws in C6 and 7 distracting the  interspace prior to using a drill to decorticate  the vertebral end plates.  We then inserted the bone graft  in the distracted C6-7 interspace and then  removed distraction screws.  There was good snug fit of the bone graft.   We now turned our attention to anterior spinal instrumentation.  We obtained  the appropriate length Synthes titanium plates and laid it along the  anterior aspects of C6 and C7. We drilled holes at C6 and C7, tapped the  holes and then we secured the plates over the vertebral bodies placing two  14 mm screws at C6 and two at C7.  We then obtained an intraoperative  radiograph that demonstrated good position of the plates, screws and  interbody grafts.  We then secured the screws  too the plate using a locking  screw at each screw and then we obtained stringent hemostasis using bipolar  electrocautery.  We copiously irrigated the wound out with bacitracin  solution and removed the solution and then removed the Caspar  self-  retaining retractor, inspected the esophagus for any damage, it was intact.  We then reapproximated the patient's platysma muscle with interrupted 3-0  Vicryl sutures, the subcutaneous tissue with interrupted 3-0 Vicryl suture  and the skin with Steri-Strips and Benzoin. The wound was then coated with  bacitracin ointment.  Sterile dressing was applied.  The drapes were removed  and the patient was subsequently extubated by the anesthesia team and  transported to the post anesthesia care unit in stable condition.  Sponge,  needle and instrument counts were correct at the end of this case.                                                Cristi Loron, M.D.    JDJ/MEDQ  D:  01/26/2003  T:  01/26/2003  Job:  [id number]

## 2010-11-27 ENCOUNTER — Emergency Department (HOSPITAL_COMMUNITY)
Admission: EM | Admit: 2010-11-27 | Discharge: 2010-11-27 | Disposition: A | Discharge status: Home / Self Care | Payer: Self-pay | Attending: Emergency Medicine | Admitting: Emergency Medicine

## 2010-11-27 DIAGNOSIS — I10 Essential (primary) hypertension: Secondary | ICD-10-CM | POA: Insufficient documentation

## 2010-11-27 DIAGNOSIS — M533 Sacrococcygeal disorders, not elsewhere classified: Secondary | ICD-10-CM | POA: Insufficient documentation

## 2010-11-27 DIAGNOSIS — E785 Hyperlipidemia, unspecified: Secondary | ICD-10-CM | POA: Insufficient documentation

## 2011-01-08 ENCOUNTER — Emergency Department (HOSPITAL_COMMUNITY): Payer: Self-pay

## 2011-01-08 ENCOUNTER — Emergency Department (HOSPITAL_COMMUNITY)
Admission: EM | Admit: 2011-01-08 | Discharge: 2011-01-08 | Disposition: A | Discharge status: Home / Self Care | Payer: Self-pay | Attending: Emergency Medicine | Admitting: Emergency Medicine

## 2011-01-08 DIAGNOSIS — I10 Essential (primary) hypertension: Secondary | ICD-10-CM | POA: Insufficient documentation

## 2011-01-08 DIAGNOSIS — R42 Dizziness and giddiness: Secondary | ICD-10-CM | POA: Insufficient documentation

## 2011-01-08 DIAGNOSIS — R51 Headache: Secondary | ICD-10-CM | POA: Insufficient documentation

## 2011-01-08 DIAGNOSIS — E785 Hyperlipidemia, unspecified: Secondary | ICD-10-CM | POA: Insufficient documentation

## 2011-01-08 LAB — CBC
HCT: 36.2 % (ref 36.0–46.0)
Hemoglobin: 11.7 g/dL — ABNORMAL LOW (ref 12.0–15.0)
MCH: 27 pg (ref 26.0–34.0)
MCHC: 32.3 g/dL (ref 30.0–36.0)
MCV: 83.6 fL (ref 78.0–100.0)
Platelets: 269 10*3/uL (ref 150–400)
RBC: 4.33 MIL/uL (ref 3.87–5.11)
RDW: 13.2 % (ref 11.5–15.5)
WBC: 11 10*3/uL — ABNORMAL HIGH (ref 4.0–10.5)

## 2011-01-08 LAB — DIFFERENTIAL
Basophils Absolute: 0 10*3/uL (ref 0.0–0.1)
Basophils Relative: 0 % (ref 0–1)
Eosinophils Absolute: 0.1 10*3/uL (ref 0.0–0.7)
Eosinophils Relative: 1 % (ref 0–5)
Lymphocytes Relative: 28 % (ref 12–46)
Lymphs Abs: 3.1 10*3/uL (ref 0.7–4.0)
Monocytes Absolute: 0.4 10*3/uL (ref 0.1–1.0)
Monocytes Relative: 4 % (ref 3–12)
Neutro Abs: 7.4 10*3/uL (ref 1.7–7.7)
Neutrophils Relative %: 68 % (ref 43–77)

## 2011-01-08 LAB — BASIC METABOLIC PANEL
BUN: 17 mg/dL (ref 6–23)
CO2: 27 mEq/L (ref 19–32)
Calcium: 9.7 mg/dL (ref 8.4–10.5)
Chloride: 105 mEq/L (ref 96–112)
Creatinine, Ser: 0.69 mg/dL (ref 0.50–1.10)
GFR calc Af Amer: >60 mL/min (ref >60)
GFR calc non Af Amer: >60 mL/min (ref >60)
Glucose, Bld: 92 mg/dL (ref 70–99)
Potassium: 3.2 mEq/L — ABNORMAL LOW (ref 3.5–5.1)
Sodium: 140 mEq/L (ref 135–145)

## 2011-01-08 LAB — URINE MICROSCOPIC-ADD ON

## 2011-01-08 LAB — URINALYSIS, ROUTINE W REFLEX MICROSCOPIC
Bilirubin Urine: NEGATIVE
Glucose, UA: NEGATIVE mg/dL
Hgb urine dipstick: NEGATIVE
Ketones, ur: NEGATIVE mg/dL
Nitrite: NEGATIVE
Protein, ur: NEGATIVE mg/dL
Specific Gravity, Urine: 1.021 (ref 1.005–1.030)
Urobilinogen, UA: 0.2 mg/dL (ref 0.0–1.0)
pH: 7 (ref 5.0–8.0)

## 2011-01-08 LAB — TROPONIN I: Troponin I: <0.3 ng/mL (ref <0.30)

## 2011-01-08 LAB — CK TOTAL AND CKMB (NOT AT ARMC)
CK, MB: 1.2 ng/mL (ref 0.3–4.0)
Relative Index: 0.6 (ref 0.0–2.5)
Total CK: 195 U/L — ABNORMAL HIGH (ref 7–177)

## 2011-01-09 LAB — URINE CULTURE
Colony Count: NO GROWTH
Culture  Setup Time: 201207170131
Culture: NO GROWTH

## 2011-02-05 ENCOUNTER — Inpatient Hospital Stay (INDEPENDENT_AMBULATORY_CARE_PROVIDER_SITE_OTHER)
Admission: RE | Admit: 2011-02-05 | Discharge: 2011-02-05 | Disposition: A | Discharge status: Home / Self Care | Payer: Self-pay | Source: Ambulatory Visit | Attending: Emergency Medicine | Admitting: Emergency Medicine

## 2011-02-05 DIAGNOSIS — M5430 Sciatica, unspecified side: Secondary | ICD-10-CM

## 2011-03-22 LAB — DIFFERENTIAL
Basophils Absolute: 0
Basophils Relative: 0
Eosinophils Absolute: 0.1
Eosinophils Relative: 1
Lymphocytes Relative: 32
Lymphs Abs: 4.2 — ABNORMAL HIGH
Monocytes Absolute: 0.7
Monocytes Relative: 5
Neutro Abs: 8.2 — ABNORMAL HIGH
Neutrophils Relative %: 62

## 2011-03-22 LAB — BASIC METABOLIC PANEL
BUN: 21
BUN: 14
CO2: 26
CO2: 24
Calcium: 10
Calcium: 9.5
Chloride: 94 — ABNORMAL LOW
Chloride: 104
Creatinine, Ser: 1.14
Creatinine, Ser: 0.48
GFR calc Af Amer: >60
GFR calc Af Amer: >60
GFR calc non Af Amer: 50 — ABNORMAL LOW
GFR calc non Af Amer: >60
Glucose, Bld: 153 — ABNORMAL HIGH
Glucose, Bld: 105 — ABNORMAL HIGH
Potassium: 2.5 — CL
Potassium: 3.2 — ABNORMAL LOW
Sodium: 135
Sodium: 137

## 2011-03-22 LAB — URINALYSIS, ROUTINE W REFLEX MICROSCOPIC
Bilirubin Urine: NEGATIVE
Bilirubin Urine: NEGATIVE
Glucose, UA: NEGATIVE
Glucose, UA: NEGATIVE
Hgb urine dipstick: NEGATIVE
Hgb urine dipstick: NEGATIVE
Ketones, ur: NEGATIVE
Ketones, ur: NEGATIVE
Leukocytes, UA: NEGATIVE
Nitrite: NEGATIVE
Nitrite: NEGATIVE
Protein, ur: 100 — AB
Protein, ur: NEGATIVE
Specific Gravity, Urine: 1.014
Specific Gravity, Urine: 1.012
Urobilinogen, UA: 0.2
Urobilinogen, UA: 0.2
pH: 5
pH: 6.5

## 2011-03-22 LAB — CBC
HCT: 45.6
HCT: 35.9 — ABNORMAL LOW
Hemoglobin: 15.2 — ABNORMAL HIGH
Hemoglobin: 12
MCHC: 33.3
MCHC: 33.6
MCV: 83.7
MCV: 83.1
Platelets: 328
Platelets: 252
RBC: 5.45 — ABNORMAL HIGH
RBC: 4.32
RDW: 12.8
RDW: 13.2
WBC: 13.3 — ABNORMAL HIGH
WBC: 10.4

## 2011-03-22 LAB — URINE MICROSCOPIC-ADD ON

## 2011-03-22 LAB — POCT I-STAT, CHEM 8
BUN: 24 — ABNORMAL HIGH
Calcium, Ion: 1.19
Chloride: 105
Creatinine, Ser: 1.2
Glucose, Bld: 104 — ABNORMAL HIGH
HCT: 45
Hemoglobin: 15.3 — ABNORMAL HIGH
Potassium: 3.4 — ABNORMAL LOW
Sodium: 137
TCO2: 26

## 2011-03-22 LAB — HEPATIC FUNCTION PANEL
ALT: 16
AST: 19
Albumin: 4.4
Alkaline Phosphatase: 88
Bilirubin, Direct: 0.1
Indirect Bilirubin: 0.8
Total Bilirubin: 0.9
Total Protein: 8.3

## 2011-03-22 LAB — LIPASE, BLOOD: Lipase: 20

## 2011-03-22 LAB — PREGNANCY, URINE: Preg Test, Ur: NEGATIVE

## 2011-04-09 LAB — CBC
HCT: 36.9
Hemoglobin: 12.5
MCHC: 33.8
MCV: 82.5
Platelets: 313
RBC: 4.48
RDW: 13.3
WBC: 9.8

## 2011-04-09 LAB — BASIC METABOLIC PANEL
BUN: 12
CO2: 28
Calcium: 10.1
Chloride: 107
Creatinine, Ser: 0.63
GFR calc Af Amer: >60
GFR calc non Af Amer: >60
Glucose, Bld: 93
Potassium: 3.5
Sodium: 144

## 2011-04-09 LAB — POCT CARDIAC MARKERS
CKMB, poc: <1 — ABNORMAL LOW
CKMB, poc: <1 — ABNORMAL LOW
Myoglobin, poc: 21.5
Myoglobin, poc: 37.8
Operator id: 4295
Operator id: 4661
Troponin i, poc: <0.05
Troponin i, poc: <0.05

## 2011-04-09 LAB — DIFFERENTIAL
Basophils Absolute: 0
Basophils Relative: 0
Eosinophils Absolute: 0.1
Eosinophils Relative: 1
Lymphocytes Relative: 35
Lymphs Abs: 3.4 — ABNORMAL HIGH
Monocytes Absolute: 0.4
Monocytes Relative: 4
Neutro Abs: 5.8
Neutrophils Relative %: 59

## 2011-06-07 ENCOUNTER — Emergency Department (HOSPITAL_COMMUNITY): Payer: Self-pay

## 2011-06-07 ENCOUNTER — Encounter: Payer: Self-pay | Admitting: *Deleted

## 2011-06-07 ENCOUNTER — Emergency Department (HOSPITAL_COMMUNITY)
Admission: EM | Admit: 2011-06-07 | Discharge: 2011-06-08 | Disposition: A | Discharge status: Home / Self Care | Payer: Self-pay | Attending: Emergency Medicine | Admitting: Emergency Medicine

## 2011-06-07 DIAGNOSIS — R799 Abnormal finding of blood chemistry, unspecified: Secondary | ICD-10-CM | POA: Insufficient documentation

## 2011-06-07 DIAGNOSIS — R0789 Other chest pain: Secondary | ICD-10-CM

## 2011-06-07 DIAGNOSIS — R079 Chest pain, unspecified: Secondary | ICD-10-CM | POA: Insufficient documentation

## 2011-06-07 DIAGNOSIS — I10 Essential (primary) hypertension: Secondary | ICD-10-CM | POA: Insufficient documentation

## 2011-06-07 DIAGNOSIS — R071 Chest pain on breathing: Secondary | ICD-10-CM | POA: Insufficient documentation

## 2011-06-07 HISTORY — DX: Essential (primary) hypertension: I10

## 2011-06-07 LAB — POCT I-STAT, CHEM 8
BUN: 20 mg/dL (ref 6–23)
Calcium, Ion: 1.24 mmol/L (ref 1.12–1.32)
Chloride: 106 mEq/L (ref 96–112)
Creatinine, Ser: 0.8 mg/dL (ref 0.50–1.10)
Glucose, Bld: 124 mg/dL — ABNORMAL HIGH (ref 70–99)
HCT: 45 % (ref 36.0–46.0)
Hemoglobin: 15.3 g/dL — ABNORMAL HIGH (ref 12.0–15.0)
Potassium: 3.8 mEq/L (ref 3.5–5.1)
Sodium: 142 mEq/L (ref 135–145)
TCO2: 30 mmol/L (ref 0–100)

## 2011-06-07 LAB — POCT I-STAT TROPONIN I: Troponin i, poc: 0 ng/mL (ref 0.00–0.08)

## 2011-06-07 LAB — D-DIMER, QUANTITATIVE (NOT AT ARMC): D-Dimer, Quant: 0.61 ug/mL-FEU — ABNORMAL HIGH (ref 0.00–0.48)

## 2011-06-07 MED ORDER — IOHEXOL 300 MG/ML  SOLN
100.0000 mL | Freq: Once | INTRAMUSCULAR | Status: AC | PRN
  Administered 2011-06-07: 100 mL via INTRAVENOUS

## 2011-06-07 MED ORDER — ACETAMINOPHEN 500 MG PO TABS
1000.0000 mg | ORAL_TABLET | Freq: Once | ORAL | Status: AC
  Administered 2011-06-07: 1000 mg via ORAL
  Filled 2011-06-07 (×2): qty 1

## 2011-06-07 MED ORDER — KETOROLAC TROMETHAMINE 30 MG/ML IJ SOLN
30.0000 mg | Freq: Once | INTRAMUSCULAR | Status: AC
  Administered 2011-06-07: 30 mg via INTRAVENOUS
  Filled 2011-06-07: qty 1

## 2011-06-07 MED ORDER — OXYCODONE-ACETAMINOPHEN 5-325 MG PO TABS
1.0000 | ORAL_TABLET | Freq: Once | ORAL | Status: AC
  Administered 2011-06-07: 1 via ORAL
  Filled 2011-06-07: qty 1

## 2011-06-07 NOTE — ED Notes (Signed)
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

## 2011-06-07 NOTE — ED Notes (Signed)
Dr. Clarene Duke aware of elevated BP. No further orders received at this time.

## 2011-06-07 NOTE — ED Notes (Signed)
MD at bedside. 

## 2011-06-07 NOTE — ED Notes (Signed)
Social worker not in ER presently. Charge nurse suggested that pt call her mother's PMD for resource info for respite care. Pt agrees with plan. Pt's sister also at bedside.

## 2011-06-07 NOTE — ED Notes (Signed)
Pt states she has a history of HTN and has taken her BP meds today. States she is under a lot of stress. States her chest pain is a constant ache to her mid chest. Pt denies radiation of pain.

## 2011-06-07 NOTE — ED Notes (Signed)
Patient transported to CT 

## 2011-06-07 NOTE — ED Notes (Signed)
Pt presenting to ed with c/o chest pain x 1 week pt denies nausea, vomiting and radiating pain at this time. Pt is alert and oriented at this time. Pt is in nad. 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

## 2011-06-07 NOTE — ED Notes (Signed)
Rainbow drawn 1 blue, 1 lavender, 1 light green tube sent to lab. 1 dark green tube in mini lab

## 2011-06-07 NOTE — ED Notes (Signed)
Patient returned from CT

## 2011-06-07 NOTE — ED Provider Notes (Signed)
History     CSN: [id number] Arrival date & time: 06/07/2011  6:17 PM   Chief Complaint  Patient presents with  . Chest Pain   HPI Pt was seen at 2000.  Per pt, c/o gradual onset and persistence of constant right sided chest "pain" x2 days.  Describes the pain as "dull" and "achey" with occasional "sharp" episodes.  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  Denies palpitations, no SOB/cough, no fevers, no back pain, no abd pain, no N/V/D.  Past Medical History  Diagnosis Date  . Hypertension     Past Surgical History  Procedure Date  . Cesarean section   . Cholecystectomy     No family history on file.  History  Substance Use Topics  . Smoking status: Former Games developer  . Smokeless tobacco: Not on file  . Alcohol Use: Yes     ocassionally    Review of Systems ROS: Statement: All systems negative except as marked or noted in the HPI; Constitutional: Negative for fever and chills. ; ; Eyes: Negative for eye pain, redness and discharge. ; ; ENMT: Negative for ear pain, hoarseness, nasal congestion, sinus pressure and sore throat. ; ; Cardiovascular: +CP.  Negative for palpitations, diaphoresis, dyspnea and peripheral edema. ; ; Respiratory: Negative for cough, wheezing and stridor. ; ; Gastrointestinal: Negative for nausea, vomiting, diarrhea, abdominal pain, blood in stool, hematemesis, jaundice and rectal bleeding. . ; ; Genitourinary: Negative for dysuria, flank pain and hematuria. ; ; Musculoskeletal: Negative for back pain and neck pain. Negative for swelling and trauma.; ; Skin: Negative for pruritus, rash, abrasions, blisters, bruising and skin lesion.; ; Neuro: Negative for headache, lightheadedness and neck stiffness. Negative for weakness, altered level of consciousness , altered mental status, extremity weakness, paresthesias, involuntary movement, seizure and syncope. Psych:   +anxiety. No SI, no SA, no HI, no hallucinations.   Allergies  Review of patient's allergies indicates no known allergies.  Home Medications   Current Outpatient Rx  Name Route Sig Dispense Refill  . ACETAMINOPHEN 500 MG PO TABS Oral Take 1,000 mg by mouth every 6 (six) hours as needed. For pain.     [name] HYDROCHLOROTHIAZIDE 25 MG PO TABS Oral Take 25 mg by mouth daily.        BP 184/102  Pulse 71  Temp(Src) 98.2 F (36.8 C) (Oral)  Resp 12  Wt 122 lb (55.339 kg)  SpO2 99%  Physical Exam 2005: Physical examination:  Nursing notes reviewed; Vital signs and O2 SAT reviewed;  Constitutional: Well developed, Well nourished, Well hydrated, In no acute distress; Head:  Normocephalic, atraumatic; Eyes: EOMI, PERRL, No scleral icterus; ENMT: Mouth and pharynx normal, Mucous membranes moist; Neck: Supple, Full range of motion, No lymphadenopathy; Cardiovascular: Regular rate and rhythm, No murmur, rub, or gallop; Respiratory: Breath sounds clear & equal bilaterally, No rales, rhonchi, wheezes, or rub, Normal respiratory effort/excursion; Chest: +TTP right anterior chest wall and parasternal areas, Movement normal; Abdomen: Soft, Nontender, Nondistended, Normal bowel sounds; Extremities: Pulses normal, No tenderness, No edema, No calf edema or asymmetry.; Neuro: AA&Ox3, Major CN grossly intact.  No gross focal motor or sensory deficits in extremities.; Skin: Color normal, Warm, Dry, no rash.    ED Course  Procedures   MDM  MDM Reviewed: nursing note and vitals Reviewed previous: ECG Interpretation: labs, x-ray and ECG    Date: 06/07/2011  Rate: 78  Rhythm: normal sinus rhythm  QRS Axis: left  Intervals: normal  ST/T Wave abnormalities: normal  Conduction Disutrbances:none  Narrative Interpretation:   Old EKG Reviewed: changes noted; previous EKG dated 01/08/2011 with NS STTW changes that are improved on today's EKG.    Results for orders placed during the hospital encounter of  06/07/11  D-DIMER, QUANTITATIVE      Component Value Range   D-Dimer, Quant 0.61 (*) 0.00 - 0.48 (ug/mL-FEU)  POCT I-STAT, CHEM 8      Component Value Range   Sodium 142  135 - 145 (mEq/L)   Potassium 3.8  3.5 - 5.1 (mEq/L)   Chloride 106  96 - 112 (mEq/L)   BUN 20  6 - 23 (mg/dL)   Creatinine, Ser 4.09  0.50 - 1.10 (mg/dL)   Glucose, Bld 811 (*) 70 - 99 (mg/dL)   Calcium, Ion 9.14  7.82 - 1.32 (mmol/L)   TCO2 30  0 - 100 (mmol/L)   Hemoglobin 15.3 (*) 12.0 - 15.0 (g/dL)   HCT 95.6  21.3 - 08.6 (%)  POCT I-STAT TROPONIN I      Component Value Range   Troponin i, poc 0.00  0.00 - 0.08 (ng/mL)   Comment 3            Dg Chest 2 View  06/07/2011  *RADIOLOGY REPORT*  Clinical Data: Chest pain  CHEST - 2 VIEW  Comparison: 01/08/2011  Findings: The lungs are clear without focal infiltrate, edema, pneumothorax or pleural effusion.  Single symmetric nodular densities over each lung base are compatible with nipple shadows, as confirmed on the lateral film. The cardiopericardial silhouette is within normal limits for size. Imaged bony structures of the thorax are intact. Telemetry leads overlie the chest.  IMPRESSION: Normal exam.  Original Report Authenticated By: ERIC A. MANSELL, M.D.   Ct Angio Chest W/cm &/or Wo Cm  06/07/2011  *RADIOLOGY REPORT*  Clinical Data:  Chest pain with elevated D-dimer.  0  CT ANGIOGRAPHY CHEST WITH CONTRAST  Technique:  Multidetector CT imaging of the chest was performed using the standard protocol during bolus administration of intravenous contrast.  Multiplanar CT image reconstructions including MIPs were obtained to evaluate the vascular anatomy.  Contrast: OMNIPAQUE IOHEXOL 300 MG/ML IV SOLN  Comparison:  chest x-ray from 06/07/2011, earlier the same day. Chest CT from 07/19/2006  Findings:  There are no filling defects in the opacified pulmonary arteries to suggest the presence of an acute pulmonary embolus.  No thoracic aortic aneurysm.  No dissection of  the thoracic aorta.  There is no axillary, mediastinal, or hilar lymphadenopathy.  The heart is at upper limits of normal for size to borderline enlarged. There is no pericardial or pleural effusion.  There is some subsegmental atelectasis in the lingula.  Lungs are otherwise clear.  Bone windows reveal no worrisome lytic or sclerotic osseous lesions.  Review of the MIP images confirms the above findings.  IMPRESSION: No CT evidence for acute pulmonary embolus.  No findings to explain the patient's history of chest pain.  Original Report Authenticated By: ERIC A. MANSELL, M.D.    11:59 PM:  ED RN gave pt resources regarding respite care and adult daycare.  Hx of HTN, has meds.  Feels better and wants to go home now.  Doubt ACS with constant CP x2 days, normal EKG, normal troponin.  Dx testing d/w pt and family.  Questions answered.  Verb understanding, agreeable to d/c home with outpt f/u.  Markeese Boyajian Allison Quarry, DO 06/09/11 2027

## 2011-06-08 MED ORDER — HYDROCODONE-ACETAMINOPHEN 5-325 MG PO TABS: ORAL_TABLET | ORAL | Status: AC

## 2011-06-08 MED ORDER — METHOCARBAMOL 500 MG PO TABS: 1000.0000 mg | ORAL_TABLET | Freq: Four times a day (QID) | ORAL | Status: AC | PRN

## 2011-07-02 ENCOUNTER — Emergency Department (HOSPITAL_COMMUNITY)
Admission: EM | Admit: 2011-07-02 | Discharge: 2011-07-02 | Disposition: A | Discharge status: Home / Self Care | Payer: Self-pay | Attending: Emergency Medicine | Admitting: Emergency Medicine

## 2011-07-02 ENCOUNTER — Emergency Department (HOSPITAL_COMMUNITY): Payer: Self-pay

## 2011-07-02 ENCOUNTER — Other Ambulatory Visit: Payer: Self-pay

## 2011-07-02 ENCOUNTER — Encounter (HOSPITAL_COMMUNITY): Payer: Self-pay | Admitting: *Deleted

## 2011-07-02 DIAGNOSIS — R072 Precordial pain: Secondary | ICD-10-CM | POA: Insufficient documentation

## 2011-07-02 DIAGNOSIS — R059 Cough, unspecified: Secondary | ICD-10-CM | POA: Insufficient documentation

## 2011-07-02 DIAGNOSIS — R110 Nausea: Secondary | ICD-10-CM | POA: Insufficient documentation

## 2011-07-02 DIAGNOSIS — R05 Cough: Secondary | ICD-10-CM | POA: Insufficient documentation

## 2011-07-02 DIAGNOSIS — I10 Essential (primary) hypertension: Secondary | ICD-10-CM | POA: Insufficient documentation

## 2011-07-02 DIAGNOSIS — R51 Headache: Secondary | ICD-10-CM | POA: Insufficient documentation

## 2011-07-02 LAB — HEPATIC FUNCTION PANEL
ALT: 12 U/L (ref 0–35)
AST: 12 U/L (ref 0–37)
Albumin: 4 g/dL (ref 3.5–5.2)
Alkaline Phosphatase: 88 U/L (ref 39–117)
Bilirubin, Direct: <0.1 mg/dL (ref 0.0–0.3)
Total Bilirubin: 0.2 mg/dL — ABNORMAL LOW (ref 0.3–1.2)
Total Protein: 7.4 g/dL (ref 6.0–8.3)

## 2011-07-02 LAB — DIFFERENTIAL
Basophils Absolute: 0 10*3/uL (ref 0.0–0.1)
Basophils Relative: 0 % (ref 0–1)
Eosinophils Absolute: 0.2 10*3/uL (ref 0.0–0.7)
Eosinophils Relative: 2 % (ref 0–5)
Lymphocytes Relative: 34 % (ref 12–46)
Lymphs Abs: 3.6 10*3/uL (ref 0.7–4.0)
Monocytes Absolute: 0.5 10*3/uL (ref 0.1–1.0)
Monocytes Relative: 5 % (ref 3–12)
Neutro Abs: 6.4 10*3/uL (ref 1.7–7.7)
Neutrophils Relative %: 60 % (ref 43–77)

## 2011-07-02 LAB — CBC
HCT: 39 % (ref 36.0–46.0)
Hemoglobin: 13 g/dL (ref 12.0–15.0)
MCH: 28.1 pg (ref 26.0–34.0)
MCHC: 33.3 g/dL (ref 30.0–36.0)
MCV: 84.4 fL (ref 78.0–100.0)
Platelets: 308 10*3/uL (ref 150–400)
RBC: 4.62 MIL/uL (ref 3.87–5.11)
RDW: 12.9 % (ref 11.5–15.5)
WBC: 10.9 10*3/uL — ABNORMAL HIGH (ref 4.0–10.5)

## 2011-07-02 LAB — BASIC METABOLIC PANEL
BUN: 14 mg/dL (ref 6–23)
CO2: 26 mEq/L (ref 19–32)
Calcium: 9.6 mg/dL (ref 8.4–10.5)
Chloride: 103 mEq/L (ref 96–112)
Creatinine, Ser: 0.62 mg/dL (ref 0.50–1.10)
GFR calc Af Amer: >90 mL/min (ref >90)
GFR calc non Af Amer: >90 mL/min (ref >90)
Glucose, Bld: 124 mg/dL — ABNORMAL HIGH (ref 70–99)
Potassium: 3.2 mEq/L — ABNORMAL LOW (ref 3.5–5.1)
Sodium: 139 mEq/L (ref 135–145)

## 2011-07-02 LAB — URINALYSIS, ROUTINE W REFLEX MICROSCOPIC
Bilirubin Urine: NEGATIVE
Glucose, UA: NEGATIVE mg/dL
Hgb urine dipstick: NEGATIVE
Ketones, ur: NEGATIVE mg/dL
Nitrite: NEGATIVE
Protein, ur: NEGATIVE mg/dL
Specific Gravity, Urine: 1.008 (ref 1.005–1.030)
Urobilinogen, UA: 0.2 mg/dL (ref 0.0–1.0)
pH: 7.5 (ref 5.0–8.0)

## 2011-07-02 LAB — CARDIAC PANEL(CRET KIN+CKTOT+MB+TROPI)
CK, MB: 1.6 ng/mL (ref 0.3–4.0)
Relative Index: INVALID (ref 0.0–2.5)
Total CK: 69 U/L (ref 7–177)
Troponin I: <0.3 ng/mL (ref <0.30)

## 2011-07-02 LAB — POCT I-STAT TROPONIN I: Troponin i, poc: 0 ng/mL (ref 0.00–0.08)

## 2011-07-02 LAB — URINE MICROSCOPIC-ADD ON

## 2011-07-02 LAB — LIPASE, BLOOD: Lipase: 30 U/L (ref 11–59)

## 2011-07-02 MED ORDER — HYDROCHLOROTHIAZIDE 25 MG PO TABS
25.0000 mg | ORAL_TABLET | Freq: Once | ORAL | Status: AC
  Administered 2011-07-02: 25 mg via ORAL
  Filled 2011-07-02: qty 1

## 2011-07-02 MED ORDER — POTASSIUM CHLORIDE CRYS ER 20 MEQ PO TBCR
40.0000 meq | EXTENDED_RELEASE_TABLET | Freq: Once | ORAL | Status: AC
  Administered 2011-07-02: 40 meq via ORAL
  Filled 2011-07-02: qty 2

## 2011-07-02 MED ORDER — HYDROCHLOROTHIAZIDE 25 MG PO TABS: 25.0000 mg | ORAL_TABLET | Freq: Once | ORAL | Status: DC

## 2011-07-02 NOTE — ED Notes (Signed)
PT c/o ongoing chest discomfort since last time seen at hospital. Pt is tearful in triage, making statements that she is having increased stress at home and she has not been able to follow up or obtain a PCP secondary to lack of time and money. Pt states discomfort became worse starting yesterday. Denies shortness of breath but c/o cough. Pt states she has had n/v all weekend but has not vomited today. Pt c/o headache starting yesterday, worsening overnight, denies relief from OTC meds. Pt is hypertensive in triage.

## 2011-07-02 NOTE — ED Provider Notes (Signed)
History     CSN: [id number]  Arrival date & time 07/02/11  0600   First MD Initiated Contact with Patient 07/02/11 [phone]      Chief Complaint  Patient presents with  . Headache  . Chest Pain  . Cough  . Nausea    (Consider location/radiation/quality/duration/timing/severity/associated sxs/prior treatment) HPI Patient is a 38 roll female with history of hypertension who presents today complaining of substernal chest pressure that has been present for the past 3 days. Patient hasn't not been taking her antihypertensive medications for several months. She was seen recently with similar sensation in her chest about a week ago. At that time patient reports that it was felt this likely secondary to stress. Patient has been under significant stress caring for an invalid parent and child. She denies any associated cough, shortness of breath, or fevers with her symptoms. There is no variation with respiration or movement. Patient does feel that this is worse when she's under stress. She has no history of coronary artery disease. She's not following up with her primary care physician at this time but intends to return to their care. Patient is hypertensive on presentation. There are no other associated or modifying factors. Past Medical History  Diagnosis Date  . Hypertension     Past Surgical History  Procedure Date  . Cesarean section   . Cholecystectomy     History reviewed. No pertinent family history.  History  Substance Use Topics  . Smoking status: Former Games developer  . Smokeless tobacco: Not on file  . Alcohol Use: Yes     ocassionally    OB History    Grav Para Term Preterm Abortions TAB SAB Ect Mult Living                  Review of Systems  Constitutional: Negative.   HENT: Negative.   Eyes: Negative.   Respiratory: Negative.   Cardiovascular: Positive for chest pain.  Gastrointestinal: Negative.   Genitourinary: Negative.   Musculoskeletal: Negative.   Neurological:  Negative.   Hematological: Negative.   Psychiatric/Behavioral:       Stressed and anxious  All other systems reviewed and are negative.    Allergies  Review of patient's allergies indicates no known allergies.  Home Medications   Current Outpatient Rx  Name Route Sig Dispense Refill  . ACETAMINOPHEN 500 MG PO TABS Oral Take 1,000 mg by mouth every 6 (six) hours as needed. For pain.     [name] HYDROCHLOROTHIAZIDE 25 MG PO TABS Oral Take 25 mg by mouth daily.        BP 193/118  Pulse 72  Temp(Src) 98.4 F (36.9 C) (Oral)  Resp 14  Ht 5\' 3"  (1.6 m)  Wt 121 lb 7.6 oz (55.1 kg)  BMI 21.52 kg/m2  SpO2 100%  Physical Exam  Nursing note and vitals reviewed. Constitutional: She is oriented to person, place, and time. She appears well-developed and well-nourished.       tearful  HENT:  Head: Normocephalic and atraumatic.  Eyes: Conjunctivae and EOM are normal. Pupils are equal, round, and reactive to light.  Neck: Normal range of motion.  Cardiovascular: Normal rate, regular rhythm, normal heart sounds and intact distal pulses.  Exam reveals no gallop and no friction rub.   No murmur heard. Pulmonary/Chest: Effort normal and breath sounds normal. No respiratory distress. She has no wheezes. She has no rales.  Abdominal: Soft. Bowel sounds are normal. She exhibits no distension. There is no tenderness.  There is no rebound and no guarding.  Musculoskeletal: Normal range of motion. She exhibits no edema and no tenderness.  Neurological: She is alert and oriented to person, place, and time. No cranial nerve deficit. She exhibits normal muscle tone. Coordination normal.  Skin: Skin is warm and dry. No rash noted.  Psychiatric:       Tearful. Denies suicidal ideation    ED Course  Procedures (including critical care time)  Labs Reviewed  CBC - Abnormal; Notable for the following:    WBC 10.9 (*)    All other components within normal limits  BASIC METABOLIC PANEL - Abnormal; Notable  for the following:    Potassium 3.2 (*)    Glucose, Bld 124 (*)    All other components within normal limits  URINALYSIS, ROUTINE W REFLEX MICROSCOPIC - Abnormal; Notable for the following:    Leukocytes, UA SMALL (*)    All other components within normal limits  HEPATIC FUNCTION PANEL - Abnormal; Notable for the following:    Total Bilirubin 0.2 (*)    All other components within normal limits  URINE MICROSCOPIC-ADD ON - Abnormal; Notable for the following:    Squamous Epithelial / LPF FEW (*)    Bacteria, UA FEW (*)    All other components within normal limits  CARDIAC PANEL(CRET KIN+CKTOT+MB+TROPI)  LIPASE, BLOOD  DIFFERENTIAL  I-STAT TROPONIN I  I-STAT TROPONIN I  I-STAT TROPONIN I   Dg Chest 2 View  07/02/2011  *RADIOLOGY REPORT*  Clinical Data: Chest discomfort.  Headache.  Hypertension.  CHEST - 2 VIEW  Comparison: 06/07/2011.  Findings: Lower cervical ACDF.  Cardiopericardial silhouette appears within normal limits.  Tortuosity of the aortic arch.  No airspace disease.  No effusion. Cholecystectomy clips are present in the right upper quadrant.  IMPRESSION: No active cardiopulmonary disease.  No interval change.  Original Report Authenticated By: Andreas Newport, M.D.    Date: 07/02/2011  Rate: 78  Rhythm: normal sinus rhythm  QRS Axis: left  Intervals: normal  ST/T Wave abnormalities: nonspecific T wave changes  Conduction Disutrbances:none  Narrative Interpretation: T wave inversions noted in v5 and v6  Old EKG Reviewed: changes noted    No diagnosis found.    MDM  Patient was evaluated by myself. She was given aspirin given her hypertension as well as her report of substernal chest pain. Initial workup returned with T-wave inversions noted in V5 and V6 but no other acute changes. Chest x-ray was negative as was laboratory workup. Patient had very slight leukocytosis at 10.8. She was given a dose of her home HCTZ. Patient's initial blood pressure was reduced from 210  systolic to 180. No further intervention was thought to be needed or prudent given that we do not want to decrease patient's blood pressure too rapidly. Patient will be discharged with prescription for her medications. Patient otherwise remained him dynamically stable. A three-hour troponin was performed.  10:24 AM Three-hour troponin returned negative. Patient was feeling much better. Blood pressure was down to 179/104. Patient was discharged with 30 day prescription of her HCTZ with refill. Patient was discharged in good condition.  Cyndra Numbers, MD 07/02/11 1025

## 2011-07-02 NOTE — ED Notes (Signed)
Pt resting on stretcher at this time. States "My headache is gone but I still have just a little discomfort in my chest like Ive had for several weeks now." No other needs at this time.

## 2011-08-31 ENCOUNTER — Emergency Department (INDEPENDENT_AMBULATORY_CARE_PROVIDER_SITE_OTHER)
Admission: EM | Admit: 2011-08-31 | Discharge: 2011-08-31 | Disposition: A | Discharge status: Home Health | Payer: Self-pay | Source: Home / Self Care

## 2011-08-31 ENCOUNTER — Encounter (HOSPITAL_COMMUNITY): Payer: Self-pay

## 2011-08-31 DIAGNOSIS — I10 Essential (primary) hypertension: Secondary | ICD-10-CM

## 2011-08-31 DIAGNOSIS — K089 Disorder of teeth and supporting structures, unspecified: Secondary | ICD-10-CM

## 2011-08-31 DIAGNOSIS — H6123 Impacted cerumen, bilateral: Secondary | ICD-10-CM

## 2011-08-31 DIAGNOSIS — H6120 Impacted cerumen, unspecified ear: Secondary | ICD-10-CM

## 2011-08-31 DIAGNOSIS — K0889 Other specified disorders of teeth and supporting structures: Secondary | ICD-10-CM

## 2011-08-31 NOTE — Discharge Instructions (Signed)
Thank you for coming in today. Please follow up with a dentist for your tooth.  Please put debox drops in your ears to help dissolve the wax.  See your doctor about your blood pressure. It is too high.  If you have trouble breathing to bad chest pain go to the ER>

## 2011-08-31 NOTE — ED Provider Notes (Signed)
[name] is a 56 y.o. female who presents to Urgent Care today for   1) decreased hearing bilaterally for months.  No pain fevers chills ear discharge dizziness or tinnitus.    2) right sinus pain present for several weeks. Nasal congestion or discharge. No fevers. Notes a cavity on her right upper canine tooth.  Has not tried anything for it.    PMH reviewed. Significant only for hypertension ROS as above otherwise neg Medications reviewed. No current facility-administered medications for this encounter.   Current Outpatient Prescriptions  Medication Sig Dispense Refill  . hydrochlorothiazide (HYDRODIURIL) 25 MG tablet Take 1 tablet (25 mg total) by mouth once.  30 tablet  1  . acetaminophen (TYLENOL) 500 MG tablet Take 1,000 mg by mouth every 6 (six) hours as needed. For pain.       . hydrochlorothiazide (HYDRODIURIL) 25 MG tablet Take 25 mg by mouth daily.          Exam:  BP 182/100  Pulse 50  Temp(Src) 98.5 F (36.9 C) (Oral)  Resp 18  SpO2 100% Gen: Well NAD HEENT: EOMI,  MMM, bilaterally occluded auditory canals With cerumen .  No maxillary sinus tenderness to palpation .  Tender along upper right gum line.poor dentition    Lungs: CTABL Nl WOB Heart: RRR no MRG Abd: NABS, NT, ND Exts: Non edematous BL  LE, warm and well perfused.   Following her addition of both ears left tympanic membrane is clear.  Right is still partially occluded  Assessment and Plan: 56 year old woman with 1) cerumen impaction resolved on left persistent on right area and patient subjectively much improved. Plan to use Debrox ear drops and followup with primary care.  2) tooth pain: Plan to refer to dental.   3) hypertension: On hydrochlorothiazide. Encourage patient to followup with her primary care doctor for this issue. Warned about chest pain or trouble breathing.     Rodolph Bong, MD 08/31/11 1800

## 2011-08-31 NOTE — ED Notes (Signed)
C/o bilateral  ear congestion for weeks.  States getting worse and cannot hear well

## 2011-09-01 NOTE — ED Provider Notes (Signed)
Medical screening examination/treatment/procedure(s) were performed by a resident physician and as supervising physician I was immediately available for consultation/collaboration.  Leslee Home, M.D.   Reuben Likes, MD 09/01/11 [phone]

## 2011-12-29 ENCOUNTER — Emergency Department (HOSPITAL_COMMUNITY)
Admission: EM | Admit: 2011-12-29 | Discharge: 2011-12-29 | Disposition: A | Discharge status: Home / Self Care | Payer: Self-pay | Attending: Emergency Medicine | Admitting: Emergency Medicine

## 2011-12-29 ENCOUNTER — Encounter (HOSPITAL_COMMUNITY): Payer: Self-pay

## 2011-12-29 DIAGNOSIS — R5381 Other malaise: Secondary | ICD-10-CM | POA: Insufficient documentation

## 2011-12-29 DIAGNOSIS — I10 Essential (primary) hypertension: Secondary | ICD-10-CM | POA: Insufficient documentation

## 2011-12-29 DIAGNOSIS — Z9119 Patient's noncompliance with other medical treatment and regimen: Secondary | ICD-10-CM | POA: Insufficient documentation

## 2011-12-29 DIAGNOSIS — R5383 Other fatigue: Secondary | ICD-10-CM | POA: Insufficient documentation

## 2011-12-29 DIAGNOSIS — Z91199 Patient's noncompliance with other medical treatment and regimen due to unspecified reason: Secondary | ICD-10-CM | POA: Insufficient documentation

## 2011-12-29 DIAGNOSIS — Z87891 Personal history of nicotine dependence: Secondary | ICD-10-CM | POA: Insufficient documentation

## 2011-12-29 MED ORDER — HYDROCHLOROTHIAZIDE 12.5 MG PO CAPS
25.0000 mg | ORAL_CAPSULE | Freq: Once | ORAL | Status: AC
  Administered 2011-12-29: 25 mg via ORAL
  Filled 2011-12-29: qty 2

## 2011-12-29 MED ORDER — HYDROCHLOROTHIAZIDE 25 MG PO TABS: 25.0000 mg | ORAL_TABLET | Freq: Every day | ORAL | Status: DC

## 2011-12-29 MED ORDER — LORAZEPAM 1 MG PO TABS
1.0000 mg | ORAL_TABLET | Freq: Once | ORAL | Status: AC
  Administered 2011-12-29: 1 mg via ORAL
  Filled 2011-12-29: qty 1

## 2011-12-29 NOTE — ED Notes (Signed)
Pt in form home with chest achyness, tingling in the face and elevated BP states was sent in by job. States some nausea denies sob, states chest discomforted started early this am w/o relief denies radiating, states she is on BP meds but has not taken them in several weeks

## 2011-12-29 NOTE — ED Provider Notes (Addendum)
History     CSN: [id number]  Arrival date & time 12/29/11  1529   First MD Initiated Contact with Patient 12/29/11 1605      Chief Complaint  Patient presents with  . Hypertension    (Consider location/radiation/quality/duration/timing/severity/associated sxs/prior treatment) Patient is a 56 y.o. female presenting with hypertension. The history is provided by the patient (the pt has not been taking her bp med and now feels weak). No language interpreter was used.  Hypertension This is a chronic problem. The current episode started more than 1 week ago. The problem occurs daily. The problem has not changed since onset.Pertinent negatives include no chest pain, no abdominal pain and no headaches. Nothing aggravates the symptoms. Nothing relieves the symptoms.    Past Medical History  Diagnosis Date  . Hypertension     Past Surgical History  Procedure Date  . Cesarean section   . Cholecystectomy     No family history on file.  History  Substance Use Topics  . Smoking status: Former Games developer  . Smokeless tobacco: Not on file  . Alcohol Use: Yes     ocassionally    OB History    Grav Para Term Preterm Abortions TAB SAB Ect Mult Living                  Review of Systems  Constitutional: Positive for fatigue.  HENT: Negative for congestion, sinus pressure and ear discharge.   Eyes: Negative for discharge.  Respiratory: Negative for cough.   Cardiovascular: Negative for chest pain.  Gastrointestinal: Negative for abdominal pain and diarrhea.  Genitourinary: Negative for frequency and hematuria.  Musculoskeletal: Negative for back pain.  Skin: Negative for rash.  Neurological: Negative for seizures and headaches.  Hematological: Negative.   Psychiatric/Behavioral: Negative for hallucinations.    Allergies  Review of patient's allergies indicates no known allergies.  Home Medications   Current Outpatient Rx  Name Route Sig Dispense Refill  . HYDROCHLOROTHIAZIDE  25 MG PO TABS Oral Take 25 mg by mouth daily.      [name] HYDROCHLOROTHIAZIDE 25 MG PO TABS Oral Take 1 tablet (25 mg total) by mouth daily. 30 tablet 3    BP 163/99  Pulse 71  Temp 99 F (37.2 C) (Oral)  Resp 18  SpO2 100%  Physical Exam  Constitutional: She is oriented to person, place, and time. She appears well-developed.  HENT:  Head: Normocephalic and atraumatic.  Eyes: Conjunctivae and EOM are normal. No scleral icterus.  Neck: Neck supple. No thyromegaly present.  Cardiovascular: Normal rate and regular rhythm.  Exam reveals no gallop and no friction rub.   No murmur heard. Pulmonary/Chest: No stridor. She has no wheezes. She has no rales. She exhibits no tenderness.  Abdominal: She exhibits no distension. There is no tenderness. There is no rebound.  Musculoskeletal: Normal range of motion. She exhibits no edema.  Lymphadenopathy:    She has no cervical adenopathy.  Neurological: She is oriented to person, place, and time. Coordination normal.  Skin: No rash noted. No erythema.  Psychiatric: She has a normal mood and affect. Her behavior is normal.    ED Course  Procedures (including critical care time)  Labs Reviewed - No data to display No results found.   1. Hypertension     Pt improved with bp better  MDM     Date: 01/23/2012  Rate: 81  Rhythm: normal sinus rhythm  QRS Axis: left  Intervals: normal  ST/T Wave abnormalities: normal  Conduction Disutrbances:none  Narrative Interpretation:   Old EKG Reviewed: none available        Benny Lennert, MD 12/29/11 1753  Benny Lennert, MD 01/23/12 [phone]

## 2011-12-29 NOTE — ED Notes (Signed)
Discharged with sister

## 2011-12-29 NOTE — ED Notes (Signed)
MD at bedside. 

## 2012-01-25 ENCOUNTER — Emergency Department (HOSPITAL_COMMUNITY)
Admission: EM | Admit: 2012-01-25 | Discharge: 2012-01-25 | Disposition: A | Discharge status: Home / Self Care | Payer: Self-pay | Attending: Emergency Medicine | Admitting: Emergency Medicine

## 2012-01-25 ENCOUNTER — Encounter (HOSPITAL_COMMUNITY): Payer: Self-pay | Admitting: *Deleted

## 2012-01-25 ENCOUNTER — Emergency Department (HOSPITAL_COMMUNITY): Payer: Self-pay

## 2012-01-25 DIAGNOSIS — Z9089 Acquired absence of other organs: Secondary | ICD-10-CM | POA: Insufficient documentation

## 2012-01-25 DIAGNOSIS — R51 Headache: Secondary | ICD-10-CM | POA: Insufficient documentation

## 2012-01-25 DIAGNOSIS — Z87891 Personal history of nicotine dependence: Secondary | ICD-10-CM | POA: Insufficient documentation

## 2012-01-25 DIAGNOSIS — I10 Essential (primary) hypertension: Secondary | ICD-10-CM | POA: Insufficient documentation

## 2012-01-25 LAB — POCT I-STAT, CHEM 8
BUN: 11 mg/dL (ref 6–23)
Calcium, Ion: 1.24 mmol/L — ABNORMAL HIGH (ref 1.12–1.23)
Chloride: 103 mEq/L (ref 96–112)
Creatinine, Ser: 0.8 mg/dL (ref 0.50–1.10)
Glucose, Bld: 85 mg/dL (ref 70–99)
HCT: 38 % (ref 36.0–46.0)
Hemoglobin: 12.9 g/dL (ref 12.0–15.0)
Potassium: 3.4 mEq/L — ABNORMAL LOW (ref 3.5–5.1)
Sodium: 142 mEq/L (ref 135–145)
TCO2: 27 mmol/L (ref 0–100)

## 2012-01-25 LAB — POCT I-STAT TROPONIN I: Troponin i, poc: 0.01 ng/mL (ref 0.00–0.08)

## 2012-01-25 MED ORDER — LISINOPRIL-HYDROCHLOROTHIAZIDE 10-12.5 MG PO TABS: 1.0000 | ORAL_TABLET | Freq: Every day | ORAL | Status: DC

## 2012-01-25 MED ORDER — POTASSIUM CHLORIDE CRYS ER 20 MEQ PO TBCR: 20.0000 meq | EXTENDED_RELEASE_TABLET | Freq: Every day | ORAL | Status: DC

## 2012-01-25 MED ORDER — POTASSIUM CHLORIDE CRYS ER 20 MEQ PO TBCR
40.0000 meq | EXTENDED_RELEASE_TABLET | Freq: Once | ORAL | Status: AC
  Administered 2012-01-25: 40 meq via ORAL
  Filled 2012-01-25: qty 2

## 2012-01-25 MED ORDER — LISINOPRIL 10 MG PO TABS
10.0000 mg | ORAL_TABLET | ORAL | Status: AC
  Administered 2012-01-25: 10 mg via ORAL
  Filled 2012-01-25: qty 1

## 2012-01-25 NOTE — ED Notes (Signed)
Pt states she states for the past couple of weeks she has been tired and started to have headaches for the past week. Pt states she took her bp at home and it was high. Pt denies she just has that funny feeling on her head

## 2012-01-25 NOTE — ED Provider Notes (Signed)
History     CSN: [id number]  Arrival date & time 01/25/12  1818   First MD Initiated Contact with Patient 01/25/12 1918      Chief Complaint  Patient presents with  . Hypertension  . Headache    (Consider location/radiation/quality/duration/timing/severity/associated sxs/prior treatment) HPI Developed headache 5 PM today gradual onset and has improved with time, without treatment . Associated symptoms include nausea which has resolved and patient has had feeling of fatigue over the past 2 weeks. Treated with HCTZ. Denies noncompliance with medications no other treatment prior to coming here. Headache is minimal at present frontal in location nonradiating Past Medical History  Diagnosis Date  . Hypertension     Past Surgical History  Procedure Date  . Cesarean section   . Cholecystectomy     No family history on file.  History  Substance Use Topics  . Smoking status: Former Games developer  . Smokeless tobacco: Not on file  . Alcohol Use: Yes     ocassionally    OB History    Grav Para Term Preterm Abortions TAB SAB Ect Mult Living                  Review of Systems  Constitutional: Positive for fatigue.  Respiratory: Negative.   Cardiovascular: Negative.   Gastrointestinal: Positive for nausea.  Musculoskeletal: Negative.   Skin: Negative.   Neurological: Positive for headaches.  Hematological: Negative.   Psychiatric/Behavioral: Negative.   All other systems reviewed and are negative.    Allergies  Review of patient's allergies indicates no known allergies.  Home Medications   Current Outpatient Rx  Name Route Sig Dispense Refill  . HYDROCHLOROTHIAZIDE 25 MG PO TABS Oral Take 1 tablet (25 mg total) by mouth daily. 30 tablet 3    BP 181/107  Pulse 75  Temp 97.8 F (36.6 C) (Oral)  Resp 13  SpO2 98%  Physical Exam  Nursing note and vitals reviewed. Constitutional: She is oriented to person, place, and time. She appears well-developed and  well-nourished.  HENT:  Head: Normocephalic and atraumatic.  Eyes: Conjunctivae are normal. Pupils are equal, round, and reactive to light.       Optic discs sharp bilaterally  Neck: Neck supple. No tracheal deviation present. No thyromegaly present.  Cardiovascular: Normal rate and regular rhythm.   No murmur heard. Pulmonary/Chest: Effort normal and breath sounds normal.  Abdominal: Soft. Bowel sounds are normal. She exhibits no distension. There is no tenderness.  Musculoskeletal: Normal range of motion. She exhibits no edema and no tenderness.  Neurological: She is alert and oriented to person, place, and time. She has normal reflexes. She exhibits normal muscle tone. Coordination normal.       Gait normal Romberg normal pronator drift normal  Skin: Skin is warm and dry. No rash noted.  Psychiatric: She has a normal mood and affect.    ED Course  Procedures (including critical care time)  Labs Reviewed - No data to display No results found.  Date: 01/25/2012  Rate: 75  Rhythm: normal sinus rhythm  QRS Axis: left  Intervals: normal  ST/T Wave abnormalities: nonspecific T wave changes  Conduction Disutrbances:none  Narrative Interpretation:   Old EKG Reviewed: unchanged Unchanged from 12/29/2011 interpreted by me Results for orders placed during the hospital encounter of 01/25/12  POCT I-STAT, CHEM 8      Component Value Range   Sodium 142  135 - 145 mEq/L   Potassium 3.4 (*) 3.5 - 5.1 mEq/L  Chloride 103  96 - 112 mEq/L   BUN 11  6 - 23 mg/dL   Creatinine, Ser 1.61  0.50 - 1.10 mg/dL   Glucose, Bld 85  70 - 99 mg/dL   Calcium, Ion 0.96 (*) 1.12 - 1.23 mmol/L   TCO2 27  0 - 100 mmol/L   Hemoglobin 12.9  12.0 - 15.0 g/dL   HCT 04.5  40.9 - 81.1 %  POCT I-STAT TROPONIN I      Component Value Range   Troponin i, poc 0.01  0.00 - 0.08 ng/mL   Comment 3            Ct Head Wo Contrast  01/25/2012  *RADIOLOGY REPORT*  Clinical Data: Hypertension and headache  CT HEAD  WITHOUT CONTRAST  Technique:  Contiguous axial images were obtained from the base of the skull through the vertex without contrast.  Comparison: 01/08/2011  Findings: The brain has a normal appearance without evidence for hemorrhage, infarction, hydrocephalus, or mass lesion.  There is no extra axial fluid collection.  The skull and paranasal sinuses are normal.  IMPRESSION: No acute intracranial abnormalities.  Original Report Authenticated By: Rosealee Albee, M.D.     No diagnosis found.  8:50 PM patient resting comfortably alert Glasgow Coma Score 15. Declines pain medicine MDM  No signs of hypertensive encephalopathy Plan prescription lisinopril-HCTZ Prescription [facility] Resource referral guide Diagnosis #1 nonspecific headache #2 weakness #3 hypertension #4 hypokalemia        Doug Sou, MD 01/25/12 2056

## 2012-01-25 NOTE — ED Notes (Signed)
Placed red socks and fall risk bracelet on pt.  

## 2012-01-25 NOTE — ED Notes (Signed)
Dr. Jacubowitz at bedside 

## 2012-01-25 NOTE — ED Notes (Signed)
Pt returned from CT scan.

## 2012-07-28 ENCOUNTER — Encounter (HOSPITAL_COMMUNITY): Payer: Self-pay | Admitting: Emergency Medicine

## 2012-07-28 ENCOUNTER — Emergency Department (HOSPITAL_COMMUNITY): Payer: Self-pay

## 2012-07-28 ENCOUNTER — Emergency Department (HOSPITAL_COMMUNITY)
Admission: EM | Admit: 2012-07-28 | Discharge: 2012-07-28 | Disposition: A | Discharge status: Home / Self Care | Payer: Self-pay | Attending: Emergency Medicine | Admitting: Emergency Medicine

## 2012-07-28 ENCOUNTER — Telehealth (HOSPITAL_COMMUNITY): Payer: Self-pay | Admitting: Emergency Medicine

## 2012-07-28 ENCOUNTER — Other Ambulatory Visit: Payer: Self-pay

## 2012-07-28 DIAGNOSIS — I10 Essential (primary) hypertension: Secondary | ICD-10-CM | POA: Insufficient documentation

## 2012-07-28 DIAGNOSIS — Z87891 Personal history of nicotine dependence: Secondary | ICD-10-CM | POA: Insufficient documentation

## 2012-07-28 LAB — URINE MICROSCOPIC-ADD ON

## 2012-07-28 LAB — CBC
HCT: 38.7 % (ref 36.0–46.0)
Hemoglobin: 12.8 g/dL (ref 12.0–15.0)
MCH: 27.8 pg (ref 26.0–34.0)
MCHC: 33.1 g/dL (ref 30.0–36.0)
MCV: 84.1 fL (ref 78.0–100.0)
Platelets: 278 10*3/uL (ref 150–400)
RBC: 4.6 MIL/uL (ref 3.87–5.11)
RDW: 13.5 % (ref 11.5–15.5)
WBC: 11.2 10*3/uL — ABNORMAL HIGH (ref 4.0–10.5)

## 2012-07-28 LAB — URINALYSIS, ROUTINE W REFLEX MICROSCOPIC
Bilirubin Urine: NEGATIVE
Glucose, UA: NEGATIVE mg/dL
Hgb urine dipstick: NEGATIVE
Ketones, ur: NEGATIVE mg/dL
Nitrite: NEGATIVE
Protein, ur: NEGATIVE mg/dL
Specific Gravity, Urine: 1.011 (ref 1.005–1.030)
Urobilinogen, UA: 0.2 mg/dL (ref 0.0–1.0)
pH: 5.5 (ref 5.0–8.0)

## 2012-07-28 LAB — BASIC METABOLIC PANEL
BUN: 12 mg/dL (ref 6–23)
CO2: 25 mEq/L (ref 19–32)
Calcium: 9.4 mg/dL (ref 8.4–10.5)
Chloride: 102 mEq/L (ref 96–112)
Creatinine, Ser: 0.51 mg/dL (ref 0.50–1.10)
GFR calc Af Amer: >90 mL/min (ref >90)
GFR calc non Af Amer: >90 mL/min (ref >90)
Glucose, Bld: 149 mg/dL — ABNORMAL HIGH (ref 70–99)
Potassium: 4 mEq/L (ref 3.5–5.1)
Sodium: 140 mEq/L (ref 135–145)

## 2012-07-28 LAB — POCT I-STAT TROPONIN I: Troponin i, poc: 0 ng/mL (ref 0.00–0.08)

## 2012-07-28 LAB — TROPONIN I: Troponin I: <0.3 ng/mL (ref <0.30)

## 2012-07-28 MED ORDER — SODIUM CHLORIDE 0.9 % IV SOLN
Freq: Once | INTRAVENOUS | Status: AC
  Administered 2012-07-28: 10 mL/h via INTRAVENOUS

## 2012-07-28 MED ORDER — LISINOPRIL 40 MG PO TABS
40.0000 mg | ORAL_TABLET | Freq: Once | ORAL | Status: AC
  Administered 2012-07-28: 40 mg via ORAL
  Filled 2012-07-28: qty 1

## 2012-07-28 MED ORDER — ONDANSETRON 4 MG PO TBDP
4.0000 mg | ORAL_TABLET | Freq: Once | ORAL | Status: AC
  Administered 2012-07-28: 4 mg via ORAL
  Filled 2012-07-28: qty 1

## 2012-07-28 MED ORDER — LISINOPRIL 40 MG PO TABS: 40.0000 mg | ORAL_TABLET | Freq: Every day | ORAL | Status: DC

## 2012-07-28 MED ORDER — LISINOPRIL 10 MG PO TABS: 10.0000 mg | ORAL_TABLET | Freq: Once | ORAL | Status: DC

## 2012-07-28 NOTE — ED Notes (Signed)
Pt c/o lightheadedness, nausea, pt aware BP high, unable to get meds x 3 months, neuro intact.

## 2012-07-28 NOTE — ED Provider Notes (Signed)
History     CSN: [id number]  Arrival date & time 07/28/12  0518   First MD Initiated Contact with Patient 07/28/12 [phone]      Chief Complaint  Patient presents with  . Hypertension    (Consider location/radiation/quality/duration/timing/severity/associated sxs/prior treatment) HPI 57 year old female with a past medical history of hypertension presents emergency department with chief complaint of high blood pressure and dizziness.  Patient states that she has been under significant stress.  She has not been able to take her medications for the past 3 months to 2 loss of employment.  She is also caring for an 68 elderly mother with dementia as well as a daughter who has significant mental disability.  Patient is tearful and overwhelmed.  Patient states that yesterday she had a headache and left-sided shoulder pain as well as intermittent left chest pain.  This morning she also had some nausea and vomited slightly before she took a cab over to the emergency department.  She denies any current nausea chest pain shortness of breath.  She has had no associated diaphoresis.  Patient is a nonsmoker nondiabetic.  She has a past medical history of a father who died of MI.  Patient is unsure if he was 40 or 50 at the time.  She denies any headaches, visual disturbances, unilateral leak weakness, difficulty walking or speaking Past Medical History  Diagnosis Date  . Hypertension     Past Surgical History  Procedure Date  . Cesarean section   . Cholecystectomy     No family history on file.  History  Substance Use Topics  . Smoking status: Former Games developer  . Smokeless tobacco: Not on file  . Alcohol Use: Yes     Comment: ocassionally    OB History    Grav Para Term Preterm Abortions TAB SAB Ect Mult Living                  Review of Systems Ten systems reviewed and are negative for acute change, except as noted in the HPI.   Allergies  Review of patient's allergies indicates no known  allergies.  Home Medications   Current Outpatient Rx  Name  Route  Sig  Dispense  Refill  . IBUPROFEN 200 MG PO TABS   Oral   Take 200 mg by mouth every 6 (six) hours as needed. headache           BP 188/122  Pulse 86  Temp 98.4 F (36.9 C) (Oral)  Resp 18  Wt 116 lb (52.617 kg)  SpO2 98%  Physical Exam  Physical Exam  Nursing note and vitals reviewed. hypertensive. Constitutional: She is oriented to person, place, and time. She appears well-developed and well-nourished. No distress.  Patient is tearful during exam period. HENT:  Head: Normocephalic and atraumatic.  Eyes: Conjunctivae normal and EOM are normal. Pupils are equal, round, and reactive to light. No scleral icterus.  Neck: Normal range of motion.  Cardiovascular: Normal rate, regular rhythm and normal heart sounds.  Exam reveals no gallop and no friction rub.   No murmur heard. Pulmonary/Chest: Effort normal and breath sounds normal. No respiratory distress.  Abdominal: Soft. Bowel sounds are normal. She exhibits no distension and no mass. There is no tenderness. There is no guarding.  Neurological: She is alert and oriented to person, place, and time.  Speech is clear and goal oriented, follows commands Major Cranial nerves without deficit, no facial droop Normal strength in upper and lower extremities bilaterally  including dorsiflexion and plantar flexion, strong and equal grip strength Sensation normal to light and sharp touch Moves extremities without ataxia, coordination intact Normal finger to nose and rapid alternating movements Neg romberg, no pronator drift Normal gait Normal heel-shin and balance Skin: Skin is warm and dry. She is not diaphoretic.    ED Course  Procedures (including critical care time)  Labs Reviewed  CBC - Abnormal; Notable for the following:    WBC 11.2 (*)     All other components within normal limits  BASIC METABOLIC PANEL - Abnormal; Notable for the following:     Glucose, Bld 149 (*)     All other components within normal limits  URINALYSIS, ROUTINE W REFLEX MICROSCOPIC - Abnormal; Notable for the following:    Leukocytes, UA SMALL (*)     All other components within normal limits  URINE MICROSCOPIC-ADD ON  POCT I-STAT TROPONIN I  URINE CULTURE   Dg Chest 2 View  07/28/2012  *RADIOLOGY REPORT*  Clinical Data: Hypertension.  CHEST - 2 VIEW  Comparison: 07/02/2011.  Findings: Trachea is midline.  Heart size normal.  Lungs are clear. No pleural fluid.  IMPRESSION: No acute findings.   Original Report Authenticated By: Leanna Battles, M.D.      1. Hypertension       MDM  Patient under severe stress with uncontrolled hypertension due to the lack of medical compliance and social issues.  Her symptoms are concerning for possible intermittent cardiac ischemia.  I do not suspect stroke as she has no neurologic deficits.  Will check labs.  Patient has family history of early MI she is a former smoker and has risk factors of hypertension however she is nondiabetic.  Labs are currently pending.      11:16 AM Filed Vitals:   07/28/12 0523 07/28/12 0609 07/28/12 0730 07/28/12 0925  BP: 185/114 177/119 188/122 164/85  Pulse: 91 86  76  Temp: 98.4 F (36.9 C)   98.5 F (36.9 C)  TempSrc: Oral   Oral  Resp: 20  18 18   Weight: 116 lb (52.617 kg)     SpO2: 98%   99%    Two negative troponins.  EKG unremarkable .  Symptoms resolved at this time. Will D/c home with Lisinopril 40 mg. 1 qd.  At this time there does not appear to be any evidence of an acute emergency medical condition and the patient appears stable for discharge with appropriate outpatient follow up.Diagnosis was discussed with patient who verbalizes understanding and is agreeable to discharge. Pt case discussed with Dr. Radford Pax who agrees with my plan.    Arthor Captain, PA-C 07/28/12 1247

## 2012-07-28 NOTE — ED Provider Notes (Signed)
Medical screening examination/treatment/procedure(s) were performed by non-physician practitioner and as supervising physician I was immediately available for consultation/collaboration.    Nelia Shi, MD 07/28/12 2233

## 2012-07-28 NOTE — ED Notes (Signed)
Voiced understanding of instructions given 

## 2012-07-29 LAB — URINE CULTURE: Colony Count: 15000

## 2012-08-07 ENCOUNTER — Encounter: Payer: Self-pay | Admitting: Internal Medicine

## 2012-08-25 ENCOUNTER — Encounter: Payer: Self-pay | Admitting: Internal Medicine

## 2012-09-10 ENCOUNTER — Emergency Department (INDEPENDENT_AMBULATORY_CARE_PROVIDER_SITE_OTHER)
Admission: EM | Admit: 2012-09-10 | Discharge: 2012-09-10 | Disposition: A | Discharge status: Home / Self Care | Payer: Self-pay | Source: Home / Self Care | Attending: Family Medicine | Admitting: Family Medicine

## 2012-09-10 ENCOUNTER — Encounter (HOSPITAL_COMMUNITY): Payer: Self-pay | Admitting: Emergency Medicine

## 2012-09-10 DIAGNOSIS — E060 Acute thyroiditis: Secondary | ICD-10-CM

## 2012-09-10 LAB — POCT RAPID STREP A: Streptococcus, Group A Screen (Direct): NEGATIVE

## 2012-09-10 MED ORDER — DICLOFENAC POTASSIUM 50 MG PO TABS: 50.0000 mg | ORAL_TABLET | Freq: Three times a day (TID) | ORAL | Status: DC

## 2012-09-10 NOTE — ED Provider Notes (Signed)
History     CSN: [id number]  Arrival date & time 09/10/12  1839   First MD Initiated Contact with Patient 09/10/12 1844      Chief Complaint  Patient presents with  . Sore Throat    (Consider location/radiation/quality/duration/timing/severity/associated sxs/prior treatment) Patient is a 57 y.o. female presenting with pharyngitis. The history is provided by the patient.  Sore Throat This is a new problem. The current episode started more than 2 days ago. The problem has been gradually worsening. Pertinent negatives include no abdominal pain. The symptoms are aggravated by swallowing and bending.    Past Medical History  Diagnosis Date  . Hypertension     Past Surgical History  Procedure Laterality Date  . Cesarean section    . Cholecystectomy      No family history on file.  History  Substance Use Topics  . Smoking status: Former Games developer  . Smokeless tobacco: Not on file  . Alcohol Use: Yes     Comment: ocassionally    OB History   Grav Para Term Preterm Abortions TAB SAB Ect Mult Living                  Review of Systems  Constitutional: Negative.   HENT: Positive for trouble swallowing and neck pain. Negative for congestion, sore throat, rhinorrhea, mouth sores, neck stiffness, voice change and postnasal drip.   Gastrointestinal: Negative for abdominal pain.    Allergies  Review of patient's allergies indicates no known allergies.  Home Medications   Current Outpatient Rx  Name  Route  Sig  Dispense  Refill  . ibuprofen (ADVIL,MOTRIN) 200 MG tablet   Oral   Take 200 mg by mouth every 6 (six) hours as needed. headache         . diclofenac (CATAFLAM) 50 MG tablet   Oral   Take 1 tablet (50 mg total) by mouth 3 (three) times daily.   21 tablet   0   . lisinopril (PRINIVIL,ZESTRIL) 40 MG tablet   Oral   Take 1 tablet (40 mg total) by mouth daily.   30 tablet   0     BP 194/105  Pulse 96  Temp(Src) 98.8 F (37.1 C) (Oral)  Resp 20  SpO2  100%  Physical Exam  Nursing note and vitals reviewed. Constitutional: She is oriented to person, place, and time. She appears well-developed and well-nourished.  HENT:  Head: Normocephalic.  Right Ear: External ear normal.  Left Ear: External ear normal.  Mouth/Throat: Oropharynx is clear and moist.  Eyes: Conjunctivae are normal. Pupils are equal, round, and reactive to light.  Neck: Normal range of motion. Neck supple.  Ant lower neck soreness to palpation c/w thyroiditis., no mass., no adenopathy.  Lymphadenopathy:    She has no cervical adenopathy.  Neurological: She is alert and oriented to person, place, and time.  Skin: Skin is warm and dry.    ED Course  Procedures (including critical care time)  Labs Reviewed  POCT RAPID STREP A ([facility] ONLY)   No results found.   1. Thyroiditis, acute       MDM          Linna Hoff, MD 09/10/12 [phone]

## 2012-09-10 NOTE — ED Notes (Signed)
Pt c/o sore throat x3 days Sx include: headache, upper back/neck pain (hx of neck surg) Reports feeling a lump in her throat Pain is intermittent Denies: f/v/n/d  She is alert w/no signs of acute distress.

## 2012-10-10 ENCOUNTER — Encounter: Payer: Self-pay | Admitting: Internal Medicine

## 2013-01-31 ENCOUNTER — Emergency Department (HOSPITAL_COMMUNITY)
Admission: EM | Admit: 2013-01-31 | Discharge: 2013-01-31 | Disposition: A | Discharge status: Home / Self Care | Payer: Self-pay | Attending: Emergency Medicine | Admitting: Emergency Medicine

## 2013-01-31 ENCOUNTER — Emergency Department (HOSPITAL_COMMUNITY): Payer: Self-pay

## 2013-01-31 ENCOUNTER — Encounter (HOSPITAL_COMMUNITY): Payer: Self-pay

## 2013-01-31 DIAGNOSIS — Z87891 Personal history of nicotine dependence: Secondary | ICD-10-CM | POA: Insufficient documentation

## 2013-01-31 DIAGNOSIS — F430 Acute stress reaction: Secondary | ICD-10-CM | POA: Insufficient documentation

## 2013-01-31 DIAGNOSIS — R0789 Other chest pain: Secondary | ICD-10-CM | POA: Insufficient documentation

## 2013-01-31 DIAGNOSIS — R5381 Other malaise: Secondary | ICD-10-CM | POA: Insufficient documentation

## 2013-01-31 DIAGNOSIS — F419 Anxiety disorder, unspecified: Secondary | ICD-10-CM

## 2013-01-31 DIAGNOSIS — R079 Chest pain, unspecified: Secondary | ICD-10-CM

## 2013-01-31 DIAGNOSIS — F439 Reaction to severe stress, unspecified: Secondary | ICD-10-CM

## 2013-01-31 DIAGNOSIS — F411 Generalized anxiety disorder: Secondary | ICD-10-CM | POA: Insufficient documentation

## 2013-01-31 DIAGNOSIS — R61 Generalized hyperhidrosis: Secondary | ICD-10-CM | POA: Insufficient documentation

## 2013-01-31 DIAGNOSIS — R5383 Other fatigue: Secondary | ICD-10-CM | POA: Insufficient documentation

## 2013-01-31 DIAGNOSIS — I10 Essential (primary) hypertension: Secondary | ICD-10-CM | POA: Insufficient documentation

## 2013-01-31 LAB — BASIC METABOLIC PANEL
BUN: 11 mg/dL (ref 6–23)
CO2: 26 mEq/L (ref 19–32)
Calcium: 9.8 mg/dL (ref 8.4–10.5)
Chloride: 102 mEq/L (ref 96–112)
Creatinine, Ser: 0.58 mg/dL (ref 0.50–1.10)
GFR calc Af Amer: >90 mL/min (ref >90)
GFR calc non Af Amer: >90 mL/min (ref >90)
Glucose, Bld: 111 mg/dL — ABNORMAL HIGH (ref 70–99)
Potassium: 3.8 mEq/L (ref 3.5–5.1)
Sodium: 137 mEq/L (ref 135–145)

## 2013-01-31 LAB — CBC
HCT: 38.5 % (ref 36.0–46.0)
Hemoglobin: 12.7 g/dL (ref 12.0–15.0)
MCH: 27.6 pg (ref 26.0–34.0)
MCHC: 33 g/dL (ref 30.0–36.0)
MCV: 83.7 fL (ref 78.0–100.0)
Platelets: 314 10*3/uL (ref 150–400)
RBC: 4.6 MIL/uL (ref 3.87–5.11)
RDW: 13.4 % (ref 11.5–15.5)
WBC: 9.4 10*3/uL (ref 4.0–10.5)

## 2013-01-31 LAB — POCT I-STAT TROPONIN I
Troponin i, poc: 0 ng/mL (ref 0.00–0.08)
Troponin i, poc: 0 ng/mL (ref 0.00–0.08)

## 2013-01-31 MED ORDER — HYDROCHLOROTHIAZIDE 12.5 MG PO CAPS: 12.5000 mg | ORAL_CAPSULE | Freq: Once | ORAL | Status: DC

## 2013-01-31 MED ORDER — LORAZEPAM 1 MG PO TABS: 1.0000 mg | ORAL_TABLET | Freq: Three times a day (TID) | ORAL | Status: DC | PRN

## 2013-01-31 MED ORDER — METOPROLOL TARTRATE 1 MG/ML IV SOLN: 5.0000 mg | Freq: Once | INTRAVENOUS | Status: DC

## 2013-01-31 MED ORDER — LORAZEPAM 2 MG/ML IJ SOLN
1.0000 mg | Freq: Once | INTRAMUSCULAR | Status: AC
  Administered 2013-01-31: 1 mg via INTRAVENOUS
  Filled 2013-01-31: qty 1

## 2013-01-31 NOTE — ED Notes (Signed)
Aching chest pain x few days, been stress out, nausea. Denied sob, dizziness. VSS. EKG done. Pt stable, ABC intact. Alert, oriented.

## 2013-01-31 NOTE — ED Notes (Signed)
BP 199/109, didn't take her BP this morning. Lungs clear, NSR on monitor.

## 2013-01-31 NOTE — ED Notes (Signed)
Manual Bp, 178/114, left arm; 182/118 right arm. Denies headache or dizziness.  States queasy feeling is gone in stomach. In no acute distress.

## 2013-01-31 NOTE — ED Provider Notes (Signed)
CSN: [id number]     Arrival date & time 01/31/13  1426 History     First MD Initiated Contact with Patient 01/31/13 1521     Chief Complaint  Patient presents with  . Chest Pain   (Consider location/radiation/quality/duration/timing/severity/associated sxs/prior Treatment) HPI Comments: 57 year old female with past medical history of hypertension presents to the emergency department complaining of mid-sternal chest discomfort over the past 2 days. States she intermittently gets this pain when she is under increased stress, however over the past 2 days her stress level has increased and the pain has been constant. 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 When she got home she felt trapped again, and "just wants to be able to enjoy life". Today at work her boss told her to go to the doctor to deal with her stress. She has not had any alleviating factors for her chest discomfort. Admits to associated diaphoresis "occasionally" which she relates to hot flashes. Denies shortness of breath, nausea, fever, chills. She was told in the past to take medication for high blood pressure, however "lives paycheck to paycheck and cannot afford to pay 4 dollars for a prescription". She does not have health insurance at this time but is trying to do so. No PCP. Non-smoker. Positive family hx of MI, however after the age of 3.  Patient is a 57 y.o. female presenting with chest pain. The history is provided by the patient.  Chest Pain Associated symptoms: diaphoresis and fatigue   Associated symptoms: no back pain, no dizziness, no fever, no nausea, no shortness of breath and not vomiting     Past Medical History  Diagnosis Date  . Hypertension    Past Surgical History  Procedure Laterality Date  . Cesarean section    . Cholecystectomy     No family history on file. History  Substance  Use Topics  . Smoking status: Former Games developer  . Smokeless tobacco: Not on file  . Alcohol Use: Yes     Comment: ocassionally   OB History   Grav Para Term Preterm Abortions TAB SAB Ect Mult Living                 Review of Systems  Constitutional: Positive for diaphoresis and fatigue. Negative for fever and chills.  Respiratory: Negative for shortness of breath.   Cardiovascular: Positive for chest pain.  Gastrointestinal: Negative for nausea and vomiting.  Musculoskeletal: Negative for back pain.  Neurological: Negative for dizziness and light-headedness.  Psychiatric/Behavioral: The patient is nervous/anxious.        Positive for increased stress.  All other systems reviewed and are negative.    Allergies  Review of patient's allergies indicates no known allergies.  Home Medications   Current Outpatient Rx  Name  Route  Sig  Dispense  Refill  . acetaminophen (TYLENOL) 500 MG tablet   Oral   Take 1,000 mg by mouth every 6 (six) hours as needed for pain.          BP 199/109  Pulse 88  Temp(Src) 97.7 F (36.5 C) (Oral)  Resp 15  SpO2 100% Physical Exam  Nursing note and vitals reviewed. Constitutional: She is oriented to person, place, and time. She appears well-developed and well-nourished. No distress.  HENT:  Head: Normocephalic and atraumatic.  Mouth/Throat: Oropharynx is clear and moist.  Eyes: Conjunctivae and EOM are  normal. Pupils are equal, round, and reactive to light.  Neck: Normal range of motion. Neck supple. No JVD present.  Cardiovascular: Normal rate, regular rhythm and normal heart sounds.   Pulmonary/Chest: Effort normal and breath sounds normal.  Abdominal: Soft. Bowel sounds are normal. There is no tenderness.  Musculoskeletal: Normal range of motion. She exhibits no edema.  Neurological: She is alert and oriented to person, place, and time. She has normal strength. No sensory deficit.  Skin: Skin is warm and dry. She is not diaphoretic.   Psychiatric: Her speech is normal and behavior is normal. Her mood appears anxious.  Tearful.    ED Course   Procedures (including critical care time)  Labs Reviewed  BASIC METABOLIC PANEL - Abnormal; Notable for the following:    Glucose, Bld 111 (*)    All other components within normal limits  CBC  POCT I-STAT TROPONIN I  POCT I-STAT TROPONIN I    Date: 01/31/2013  Rate: 97  Rhythm: normal sinus rhythm  QRS Axis: left  Intervals: normal  ST/T Wave abnormalities: normal  Conduction Disutrbances:none  Narrative Interpretation: no stemi, no changes since EKG obtained on 07/28/2012  Old EKG Reviewed: unchanged   Dg Chest 2 View  01/31/2013   *RADIOLOGY REPORT*  Clinical Data: Chest pain  CHEST - 2 VIEW  Comparison: 07/28/2012  Findings: Normal heart size. 2.0 cm ovoid density projects over the right lung base.  No pneumothorax. Otherwise clear lungs.  IMPRESSION: Ovoid density at the right lung base.  Repeat with nipple markers is recommended.   Original Report Authenticated By: Jolaine Click, M.D.   1. Chest pain   2. Anxiety   3. Stress   4. Hypertension     MDM  Patient with chest discomfort, increased stress and anxiety. Tearful during exam. She is in NAD. Denying any pain medication. Will give ativan for anxiety. Hypertensive at 199/109, 180/120 after receiving ativan. Symptoms most likely related to increased stress and anxiety, however due to HTN and non-compliance, CAD is a possibility. Labs pending- cbc, bmp, troponin. EKG without changes from prior. CXR pending. Will obtain 3-hour troponin and repeat EKG after initial. Pending labs normal, patient will be discharged home with close return precautions, instructions to f/u with wellness center and discuss importance of lifestyle changes and medication compliance.  5:06 PM Labs unremarkable. Troponin negative. Will get 3-hour troponin. Patient resting comfortably and states she hasn't felt this relaxed in a while. No chest  pain. 7:36 PM 3 hour troponin negative. She is sleeping comfortably in bed. I discussed lifestyle changes in detail patient regarding her hypertension. She will followup at the wellness Center. Ativan for anxiety. Discussed stress management. Return precautions discussed. Patient is understanding of plan and is agreeable.  Trevor Mace, PA-C 01/31/13 [phone]

## 2013-02-03 NOTE — ED Provider Notes (Signed)
Medical screening examination/treatment/procedure(s) were conducted as a shared visit with non-physician practitioner(s) and myself.  I personally evaluated the patient during the encounter  Hx of HTN, comes in with chest pain. No other cardiac risk factors. Chest pain free when i reassessed her. She will get trops x 2 and EKG x 2 as CAD ED rule out, and follow up with the [facility]. Return precautions discussed, she is to return with repeat chest pain, dib, nausea, diaphoresis.  Derwood Kaplan, MD 02/03/13 1701

## 2013-02-06 ENCOUNTER — Emergency Department (HOSPITAL_COMMUNITY)
Admission: EM | Admit: 2013-02-06 | Discharge: 2013-02-06 | Disposition: A | Discharge status: Home / Self Care | Payer: Self-pay | Source: Home / Self Care

## 2013-02-06 ENCOUNTER — Encounter (HOSPITAL_COMMUNITY): Payer: Self-pay | Admitting: Emergency Medicine

## 2013-02-06 DIAGNOSIS — I10 Essential (primary) hypertension: Secondary | ICD-10-CM

## 2013-02-06 DIAGNOSIS — R079 Chest pain, unspecified: Secondary | ICD-10-CM

## 2013-02-06 MED ORDER — LISINOPRIL-HYDROCHLOROTHIAZIDE 10-12.5 MG PO TABS: 1.0000 | ORAL_TABLET | Freq: Every day | ORAL | Status: DC

## 2013-02-06 NOTE — ED Notes (Signed)
Reports chest pain or "discomfort". Patient reports onset 8/7, was seen at [facility] long and told this was a stress related and treated with lorazepam.  Patient reports she thinks the meds make her off balance.  Patient stopped lorazepam, now discomfort in chest has reoccured.  patient does not have blood pressure medicine, ran out

## 2013-02-06 NOTE — ED Provider Notes (Addendum)
[name] is a 57 y.o. female who presents to Urgent Care today for chest pressure. Patient has had chest discomfort for several weeks. She was seen in the emergency room on the seventh were troponin, chest x-ray, EKG and other labs were essentially normal. She was found to be significantly hypertensive and was started on lorazepam for stress. She has not established with a primary doctor yet because she does not have insurance.  No trouble breathing palpitations or syncope. Chest pain is nonexertional and nonradiating.    PMH reviewed. Hypertension History  Substance Use Topics  . Smoking status: Former Games developer  . Smokeless tobacco: Not on file  . Alcohol Use: Yes     Comment: ocassionally   ROS as above Medications reviewed. No current facility-administered medications for this encounter.   Current Outpatient Prescriptions  Medication Sig Dispense Refill  . acetaminophen (TYLENOL) 500 MG tablet Take 1,000 mg by mouth every 6 (six) hours as needed for pain.      [name] lisinopril-hydrochlorothiazide (ZESTORETIC) 10-12.5 MG per tablet Take 1 tablet by mouth daily.  30 tablet  1  . LORazepam (ATIVAN) 1 MG tablet Take 1 tablet (1 mg total) by mouth 3 (three) times daily as needed for anxiety.  15 tablet  0    Exam:  BP 199/112  Pulse 71  Temp(Src) 98 F (36.7 C) (Oral)  Resp 18  SpO2 100%  Gen: Well NAD HEENT: EOMI,  MMM Lungs: CTABL Nl WOB Heart: RRR no MRG Abd: NABS, NT, ND Exts: Non edematous BL  LE, warm and well perfused.   No results found for this or any previous visit (from the past 24 hour(s)). No results found.  Twelve-lead EKG shows normal sinus rhythm at 88 beats per minute. Inverted T waves in the lateral precordial leads. This is unchanged from the EKG one week ago.   Assessment and Plan: 57 y.o. female with hypertension and atypical chest pain.  Chest pain has been well evaluated and very unlikely to be acute coronary syndrome. She would benefit from an  outpatient stress test at some point in the near future.  However her blood pressure needs to be controlled currently.  Plan to start lisinopril/hydrochlorothiazide to followup [facility] ASAP for further evaluation and management of her hypertension and atypical chest pain.  Discussed warning signs or symptoms. Please see discharge instructions. Patient expresses understanding.      Rodolph Bong, MD 02/06/13 1752  Rodolph Bong, MD 02/06/13 [phone]

## 2013-02-10 ENCOUNTER — Ambulatory Visit: Payer: Self-pay

## 2013-02-11 ENCOUNTER — Ambulatory Visit: Payer: Self-pay

## 2013-02-24 ENCOUNTER — Encounter: Payer: Self-pay | Admitting: Internal Medicine

## 2013-02-24 ENCOUNTER — Ambulatory Visit: Payer: Self-pay | Attending: Internal Medicine | Admitting: Internal Medicine

## 2013-02-24 VITALS — BP 193/117 | HR 81 | Temp 98.2°F | Resp 16 | Ht 64.0 in | Wt 119.0 lb

## 2013-02-24 DIAGNOSIS — I10 Essential (primary) hypertension: Secondary | ICD-10-CM | POA: Insufficient documentation

## 2013-02-24 DIAGNOSIS — F32A Depression, unspecified: Secondary | ICD-10-CM | POA: Insufficient documentation

## 2013-02-24 DIAGNOSIS — F329 Major depressive disorder, single episode, unspecified: Secondary | ICD-10-CM | POA: Insufficient documentation

## 2013-02-24 DIAGNOSIS — F3289 Other specified depressive episodes: Secondary | ICD-10-CM | POA: Insufficient documentation

## 2013-02-24 LAB — CBC WITH DIFFERENTIAL/PLATELET
Basophils Absolute: 0.1 10*3/uL (ref 0.0–0.1)
Basophils Relative: 1 % (ref 0–1)
Eosinophils Absolute: 0.1 10*3/uL (ref 0.0–0.7)
Eosinophils Relative: 1 % (ref 0–5)
HCT: 38.9 % (ref 36.0–46.0)
Hemoglobin: 13.1 g/dL (ref 12.0–15.0)
Lymphocytes Relative: 34 % (ref 12–46)
Lymphs Abs: 3.5 10*3/uL (ref 0.7–4.0)
MCH: 27.6 pg (ref 26.0–34.0)
MCHC: 33.7 g/dL (ref 30.0–36.0)
MCV: 81.9 fL (ref 78.0–100.0)
Monocytes Absolute: 0.4 10*3/uL (ref 0.1–1.0)
Monocytes Relative: 4 % (ref 3–12)
Neutro Abs: 6.2 10*3/uL (ref 1.7–7.7)
Neutrophils Relative %: 60 % (ref 43–77)
Platelets: 313 10*3/uL (ref 150–400)
RBC: 4.75 MIL/uL (ref 3.87–5.11)
RDW: 14.1 % (ref 11.5–15.5)
WBC: 10.3 10*3/uL (ref 4.0–10.5)

## 2013-02-24 MED ORDER — LISINOPRIL-HYDROCHLOROTHIAZIDE 10-12.5 MG PO TABS: 2.0000 | ORAL_TABLET | Freq: Every day | ORAL | Status: DC

## 2013-02-24 MED ORDER — CLONIDINE HCL 0.1 MG PO TABS
0.2000 mg | ORAL_TABLET | Freq: Once | ORAL | Status: AC
  Administered 2013-02-24: 0.2 mg via ORAL

## 2013-02-24 NOTE — Patient Instructions (Signed)
Hypertension  As your heart beats, it forces blood through your arteries. This force is your blood pressure. If the pressure is too high, it is called hypertension (HTN) or high blood pressure. HTN is dangerous because you may have it and not know it. High blood pressure may mean that your heart has to work harder to pump blood. Your arteries may be narrow or stiff. The extra work puts you at risk for heart disease, stroke, and other problems.   Blood pressure consists of two numbers, a higher number over a lower, 110/72, for example. It is stated as "110 over 72." The ideal is below 120 for the top number (systolic) and under 80 for the bottom (diastolic). Write down your blood pressure today.  You should pay close attention to your blood pressure if you have certain conditions such as:   Heart failure.   Prior heart attack.   Diabetes   Chronic kidney disease.   Prior stroke.   Multiple risk factors for heart disease.  To see if you have HTN, your blood pressure should be measured while you are seated with your arm held at the level of the heart. It should be measured at least twice. A one-time elevated blood pressure reading (especially in the Emergency Department) does not mean that you need treatment. There may be conditions in which the blood pressure is different between your right and left arms. It is important to see your caregiver soon for a recheck.  Most people have essential hypertension which means that there is not a specific cause. This type of high blood pressure may be lowered by changing lifestyle factors such as:   Stress.   Smoking.   Lack of exercise.   Excessive weight.   Drug/tobacco/alcohol use.   Eating less salt.  Most people do not have symptoms from high blood pressure until it has caused damage to the body. Effective treatment can often prevent, delay or reduce that damage.  TREATMENT    When a cause has been identified, treatment for high blood pressure is directed at the cause. There are a large number of medications to treat HTN. These fall into several categories, and your caregiver will help you select the medicines that are best for you. Medications may have side effects. You should review side effects with your caregiver.  If your blood pressure stays high after you have made lifestyle changes or started on medicines,    Your medication(s) may need to be changed.   Other problems may need to be addressed.   Be certain you understand your prescriptions, and know how and when to take your medicine.   Be sure to follow up with your caregiver within the time frame advised (usually within two weeks) to have your blood pressure rechecked and to review your medications.   If you are taking more than one medicine to lower your blood pressure, make sure you know how and at what times they should be taken. Taking two medicines at the same time can result in blood pressure that is too low.  SEEK IMMEDIATE MEDICAL CARE IF:   You develop a severe headache, blurred or changing vision, or confusion.   You have unusual weakness or numbness, or a faint feeling.   You have severe chest or abdominal pain, vomiting, or breathing problems.  MAKE SURE YOU:    Understand these instructions.   Will watch your condition.   Will get help right away if you are not doing well   or get worse.  Document Released: 06/11/2005 Document Revised: 09/03/2011 Document Reviewed: 01/30/2008  ExitCare Patient Information 2014 [employer]  DASH Diet   The DASH diet stands for "Dietary Approaches to Stop Hypertension." It is a healthy eating plan that has been shown to reduce high blood pressure (hypertension) in as little as 14 days, while also possibly providing other significant health benefits. These other health benefits include reducing the risk of breast cancer after menopause and reducing the risk of type 2 diabetes, heart disease, colon cancer, and stroke. Health benefits also include weight loss and slowing kidney failure in patients with chronic kidney disease.   DIET GUIDELINES   Limit salt (sodium). Your diet should contain less than 1500 mg of sodium daily.   Limit refined or processed carbohydrates. Your diet should include mostly whole grains. Desserts and added sugars should be used sparingly.   Include small amounts of heart-healthy fats. These types of fats include nuts, oils, and tub margarine. Limit saturated and trans fats. These fats have been shown to be harmful in the body.  CHOOSING FOODS   The following food groups are based on a 2000 calorie diet. See your Registered Dietitian for individual calorie needs.  Grains and Grain Products (6 to 8 servings daily)   Eat More Often: Whole-wheat bread, brown rice, whole-grain or wheat pasta, quinoa, popcorn without added fat or salt (air popped).   Eat Less Often: White bread, white pasta, white rice, cornbread.  Vegetables (4 to 5 servings daily)   Eat More Often: Fresh, frozen, and canned vegetables. Vegetables may be raw, steamed, roasted, or grilled with a minimal amount of fat.   Eat Less Often/Avoid: Creamed or fried vegetables. Vegetables in a cheese sauce.  Fruit (4 to 5 servings daily)   Eat More Often: All fresh, canned (in natural juice), or frozen fruits. Dried fruits without added sugar. One hundred percent fruit juice ( cup [237 mL] daily).   Eat Less Often: Dried fruits with added sugar. Canned fruit in light or heavy syrup.   Lean Meats, Fish, and Poultry (2 servings or less daily. One serving is 3 to 4 oz [85-114 g]).   Eat More Often: Ninety percent or leaner ground beef, tenderloin, sirloin. Round cuts of beef, chicken breast, turkey breast. All fish. Grill, bake, or broil your meat. Nothing should be fried.   Eat Less Often/Avoid: Fatty cuts of meat, turkey, or chicken leg, thigh, or wing. Fried cuts of meat or fish.  Dairy (2 to 3 servings)   Eat More Often: Low-fat or fat-free milk, low-fat plain or light yogurt, reduced-fat or part-skim cheese.   Eat Less Often/Avoid: Milk (whole, 2%).Whole milk yogurt. Full-fat cheeses.  Nuts, Seeds, and Legumes (4 to 5 servings per week)   Eat More Often: All without added salt.   Eat Less Often/Avoid: Salted nuts and seeds, canned beans with added salt.  Fats and Sweets (limited)   Eat More Often: Vegetable oils, tub margarines without trans fats, sugar-free gelatin. Mayonnaise and salad dressings.   Eat Less Often/Avoid: Coconut oils, palm oils, butter, stick margarine, cream, half and half, cookies, candy, pie.  FOR MORE INFORMATION  The Dash Diet Eating Plan: www.dashdiet.org  Document Released: 05/31/2011 Document Revised: 09/03/2011 Document Reviewed: 05/31/2011  [employer] Patient Information 2014 [employer]

## 2013-02-24 NOTE — Progress Notes (Signed)
Patient ID: [name], female   DOB: 1955-12-10, 57 y.o.   MRN: [id number]  CC: To establish care  HPI: [name] is a 57 years old pleasant woman who came to the clinic today to establish medical care. She was seen in the urgent care recently for chest pain, MI was ruled out. However her blood pressure was high, she was started on lisinopril-hydrochlorothiazide tablets 10-12.5 mg by mouth daily. Patient verbalized having so much stress at home, she currently takes care of her 73 years old mother was having and the signs and symptoms of dementia, she also takes alpha 58 year old woman who is demented, as well as drink alcohol and a daughter who is handicap. She is behind in her bills all the time especially when she cannot walk because of her daughter, she agrees that might be contributing to her blood pressure that is uncontrollable. She sleeps well at night, she has good appetite she eats well, there is no leg swelling, chest pain has resolved. BP in the clinic today is very high despite taking her blood pressure medication in the morning. She does not smoke cigarette, she quit long time ago, she does not drink alcohol. She is not known to have diabetes.  No Known Allergies Past Medical History  Diagnosis Date  . Hypertension    Current Outpatient Prescriptions on File Prior to Visit  Medication Sig Dispense Refill  . acetaminophen (TYLENOL) 500 MG tablet Take 1,000 mg by mouth every 6 (six) hours as needed for pain.      [name] LORazepam (ATIVAN) 1 MG tablet Take 1 tablet (1 mg total) by mouth 3 (three) times daily as needed for anxiety.  15 tablet  0   No current facility-administered medications on file prior to visit.   Family History  Problem Relation Age of Onset  . Hypertension Mother   . Heart disease Father    History   Social History  . Marital Status: Widowed    Spouse Name: N/A    Number of Children: N/A  . Years of Education: N/A   Occupational History  . Not on  file.   Social History Main Topics  . Smoking status: Former Games developer  . Smokeless tobacco: Not on file  . Alcohol Use: Yes     Comment: ocassionally  . Drug Use: No  . Sexual Activity: Not on file   Other Topics Concern  . Not on file   Social History Narrative  . No narrative on file    Review of Systems: Constitutional: Negative for fever, chills, diaphoresis, activity change, appetite change and fatigue. HENT: Negative for ear pain, nosebleeds, congestion, facial swelling, rhinorrhea, neck pain, neck stiffness and ear discharge.  Eyes: Negative for pain, discharge, redness, itching and visual disturbance. Respiratory: Negative for cough, choking, chest tightness, shortness of breath, wheezing and stridor.  Cardiovascular: Negative for chest pain, palpitations and leg swelling. Gastrointestinal: Negative for abdominal distention. Genitourinary: Negative for dysuria, urgency, frequency, hematuria, flank pain, decreased urine volume, difficulty urinating and dyspareunia.  Musculoskeletal: Negative for back pain, joint swelling, arthralgias and gait problem. Neurological: Negative for dizziness, tremors, seizures, syncope, facial asymmetry, speech difficulty, weakness, light-headedness, numbness and headaches.  Hematological: Negative for adenopathy. Does not bruise/bleed easily. Psychiatric/Behavioral: Negative for hallucinations, behavioral problems, confusion, dysphoric mood, decreased concentration and agitation.    Objective:   Filed Vitals:   02/24/13 1646  BP: 193/117  Pulse: 81  Temp: 98.2 F (36.8 C)  Resp: 16    Physical  Exam: Constitutional: Patient appears well-developed and well-nourished. No distress. HENT: Normocephalic, atraumatic, External right and left ear normal. Oropharynx is clear and moist.  Eyes: Conjunctivae and EOM are normal. PERRLA, no scleral icterus. Neck: Normal ROM. Neck supple. No JVD. No tracheal deviation. No thyromegaly. CVS: RRR, S1/S2  +, no murmurs, no gallops, no carotid bruit.  Pulmonary: Effort and breath sounds normal, no stridor, rhonchi, wheezes, rales.  Abdominal: Soft. BS +,  no distension, tenderness, rebound or guarding.  Musculoskeletal: Normal range of motion. No edema and no tenderness.  Lymphadenopathy: No lymphadenopathy noted, cervical, inguinal or axillary Neuro: Alert. Normal reflexes, muscle tone coordination. No cranial nerve deficit. Skin: Skin is warm and dry. No rash noted. Not diaphoretic. No erythema. No pallor. Psychiatric: Normal mood and affect. Behavior, judgment, thought content normal.  Lab Results  Component Value Date   WBC 9.4 01/31/2013   HGB 12.7 01/31/2013   HCT 38.5 01/31/2013   MCV 83.7 01/31/2013   PLT 314 01/31/2013   Lab Results  Component Value Date   CREATININE 0.58 01/31/2013   BUN 11 01/31/2013   NA 137 01/31/2013   K 3.8 01/31/2013   CL 102 01/31/2013   CO2 26 01/31/2013    Lab Results  Component Value Date   HGBA1C  Value: 6.7 (NOTE) The ADA recommends the following therapeutic goal for glycemic control related to Hgb A1c measurement: Goal of therapy: <6.5 Hgb A1c  Reference: American Diabetes Association: Clinical Practice Recommendations 2010, Diabetes Care, 2010, 33: (Suppl  1).* 12/26/2008   Lipid Panel     Component Value Date/Time   CHOL  Value: 220        ATP III CLASSIFICATION:  <200     mg/dL   Desirable  119-147  mg/dL   Borderline High  >=829    mg/dL   High       * 56/07/1306 2045   TRIG 160* 05/02/2009 2045   HDL 48 05/02/2009 2045   CHOLHDL 4.6 05/02/2009 2045   VLDL 32 05/02/2009 2045   LDLCALC  Value: 140        Total Cholesterol/HDL:CHD Risk Coronary Heart Disease Risk Table                     Men   Women  1/2 Average Risk   3.4   3.3  Average Risk       5.0   4.4  2 X Average Risk   9.6   7.1  3 X Average Risk  23.4   11.0        Use the calculated Patient Ratio above and the CHD Risk Table to determine the patient's CHD Risk.        ATP III CLASSIFICATION (LDL):  <100      mg/dL   Optimal  657-846  mg/dL   Near or Above                    Optimal  130-159  mg/dL   Borderline  962-952  mg/dL   High  >841     mg/dL   Very High* 32/09/4008 2045       Assessment and plan:   Patient Active Problem List   Diagnosis Date Noted  . Accelerated hypertension 02/24/2013  . Depression (emotion) 02/24/2013   Give clonidine 0.2 mg tablet by mouth once in the clinic. Repeat blood pressure in 30 minutes  Increase lisinopril-hydrochlorothiazide 10-12.5 mg per tablet to 2 tablets by  mouth daily Patient extensively counseled about blood pressure control Blood pressure goal has been discussed with patient Extensive counseling about nutrition and exercise done  Patient was referred to our social worker here for counseling on social and domestic stress Behavioral therapy offered her  Labs today: CBC D. CMP Lipid profile Urinalysis Hemoglobin A1c  Amaani ANISTEN TOMASSI was given clear instructions to go to ER or return to the clinic if symptoms don't improve, worsen or new problems develop.  [name] verbalized understanding.  Jordann Ezequiel Essex was told to call to get lab results if hasn't heard anything in the next week.    Jeanann Lewandowsky, MD [facility] [place], [place] [phone]   02/24/2013, 5:21 PM

## 2013-02-24 NOTE — Progress Notes (Signed)
Pt here to establish care  Hx. HTN- prescribed BP meds @ [facility] 02/06/13 Denies CP or blurry vision 193/117

## 2013-02-25 LAB — URINALYSIS, COMPLETE
Bacteria, UA: NONE SEEN
Bilirubin Urine: NEGATIVE
Casts: NONE SEEN
Crystals: NONE SEEN
Glucose, UA: NEGATIVE mg/dL
Hgb urine dipstick: NEGATIVE
Ketones, ur: NEGATIVE mg/dL
Nitrite: NEGATIVE
Protein, ur: NEGATIVE mg/dL
Specific Gravity, Urine: 1.013 (ref 1.005–1.030)
Squamous Epithelial / LPF: NONE SEEN
Urobilinogen, UA: 0.2 mg/dL (ref 0.0–1.0)
pH: 6.5 (ref 5.0–8.0)

## 2013-02-25 LAB — CMP AND LIVER
ALT: 11 U/L (ref 0–35)
AST: 14 U/L (ref 0–37)
Albumin: 5 g/dL (ref 3.5–5.2)
Alkaline Phosphatase: 81 U/L (ref 39–117)
BUN: 15 mg/dL (ref 6–23)
Bilirubin, Direct: 0.1 mg/dL (ref 0.0–0.3)
CO2: 26 mEq/L (ref 19–32)
Calcium: 10.3 mg/dL (ref 8.4–10.5)
Chloride: 103 mEq/L (ref 96–112)
Creat: 0.72 mg/dL (ref 0.50–1.10)
Glucose, Bld: 97 mg/dL (ref 70–99)
Indirect Bilirubin: 0.2 mg/dL (ref 0.0–0.9)
Potassium: 3.6 mEq/L (ref 3.5–5.3)
Sodium: 139 mEq/L (ref 135–145)
Total Bilirubin: 0.3 mg/dL (ref 0.3–1.2)
Total Protein: 7.4 g/dL (ref 6.0–8.3)

## 2013-02-25 LAB — LIPID PANEL
Cholesterol: 247 mg/dL — ABNORMAL HIGH (ref 0–200)
HDL: 64 mg/dL (ref >39)
LDL Cholesterol: 138 mg/dL — ABNORMAL HIGH (ref 0–99)
Total CHOL/HDL Ratio: 3.9 Ratio
Triglycerides: 225 mg/dL — ABNORMAL HIGH (ref <150)
VLDL: 45 mg/dL — ABNORMAL HIGH (ref 0–40)

## 2013-02-27 ENCOUNTER — Telehealth: Payer: Self-pay | Admitting: Emergency Medicine

## 2013-02-27 NOTE — Telephone Encounter (Signed)
LEFT MESSAGE FOR PT TO CALL FOR LAB RESULTS

## 2013-02-27 NOTE — Telephone Encounter (Signed)
Message copied by Darlis Loan on Fri Feb 27, 2013 11:58 AM ------      Message from: Jeanann Lewandowsky E      Created: Wed Feb 25, 2013 10:33 AM       Please call patient to inform her that most of her lab results came back normal. Her kidney and liver functions are within normal. But her cholesterol levels are high. For now, we advise exercise at least 3 days a week, 30 minutes each time, low fat diet, more vegetable and protein diet, we will repeat the test again in 3-6 months, if still high, she may need to be on medication. ------

## 2013-03-04 ENCOUNTER — Ambulatory Visit: Payer: Self-pay | Attending: Internal Medicine

## 2013-03-04 MED ORDER — AMLODIPINE BESYLATE 10 MG PO TABS: 10.0000 mg | ORAL_TABLET | Freq: Every day | ORAL | Status: DC

## 2013-03-04 NOTE — Progress Notes (Unsigned)
Patient ID: [name] Session, female   DOB: 1955/12/02, 57 y.o.   MRN: [id number] Patient was in the clinic to check blood pressure and systolic blood pressure is over 170. We added Norvasc 10 mg daily for better blood pressure control. Patient is already on a maximum dose of lisinopril/HCTZ.  Manson Passey, MD [phone]

## 2013-03-04 NOTE — Progress Notes (Unsigned)
Patient here  For blood pressure check Blood pressure elevate was put on new medication Amlodipine 10 mg

## 2013-03-11 ENCOUNTER — Ambulatory Visit: Payer: Self-pay

## 2013-03-11 ENCOUNTER — Other Ambulatory Visit: Payer: Self-pay

## 2013-04-25 ENCOUNTER — Emergency Department (HOSPITAL_COMMUNITY)
Admission: EM | Admit: 2013-04-25 | Discharge: 2013-04-25 | Disposition: A | Discharge status: Home / Self Care | Payer: Self-pay | Attending: Emergency Medicine | Admitting: Emergency Medicine

## 2013-04-25 ENCOUNTER — Encounter (HOSPITAL_COMMUNITY): Payer: Self-pay | Admitting: Emergency Medicine

## 2013-04-25 DIAGNOSIS — Z79899 Other long term (current) drug therapy: Secondary | ICD-10-CM | POA: Insufficient documentation

## 2013-04-25 DIAGNOSIS — Z87891 Personal history of nicotine dependence: Secondary | ICD-10-CM | POA: Insufficient documentation

## 2013-04-25 DIAGNOSIS — R519 Headache, unspecified: Secondary | ICD-10-CM

## 2013-04-25 DIAGNOSIS — I10 Essential (primary) hypertension: Secondary | ICD-10-CM | POA: Insufficient documentation

## 2013-04-25 DIAGNOSIS — F411 Generalized anxiety disorder: Secondary | ICD-10-CM | POA: Insufficient documentation

## 2013-04-25 DIAGNOSIS — R079 Chest pain, unspecified: Secondary | ICD-10-CM | POA: Insufficient documentation

## 2013-04-25 DIAGNOSIS — R51 Headache: Secondary | ICD-10-CM | POA: Insufficient documentation

## 2013-04-25 DIAGNOSIS — E876 Hypokalemia: Secondary | ICD-10-CM | POA: Insufficient documentation

## 2013-04-25 DIAGNOSIS — F419 Anxiety disorder, unspecified: Secondary | ICD-10-CM

## 2013-04-25 LAB — CBC
HCT: 37.1 % (ref 36.0–46.0)
Hemoglobin: 12.2 g/dL (ref 12.0–15.0)
MCH: 27.5 pg (ref 26.0–34.0)
MCHC: 32.9 g/dL (ref 30.0–36.0)
MCV: 83.6 fL (ref 78.0–100.0)
Platelets: 319 10*3/uL (ref 150–400)
RBC: 4.44 MIL/uL (ref 3.87–5.11)
RDW: 13.5 % (ref 11.5–15.5)
WBC: 9.2 10*3/uL (ref 4.0–10.5)

## 2013-04-25 LAB — BASIC METABOLIC PANEL
BUN: 16 mg/dL (ref 6–23)
CO2: 25 mEq/L (ref 19–32)
Calcium: 9.8 mg/dL (ref 8.4–10.5)
Chloride: 105 mEq/L (ref 96–112)
Creatinine, Ser: 0.64 mg/dL (ref 0.50–1.10)
GFR calc Af Amer: >90 mL/min (ref >90)
GFR calc non Af Amer: >90 mL/min (ref >90)
Glucose, Bld: 111 mg/dL — ABNORMAL HIGH (ref 70–99)
Potassium: 3.2 mEq/L — ABNORMAL LOW (ref 3.5–5.1)
Sodium: 140 mEq/L (ref 135–145)

## 2013-04-25 LAB — POCT I-STAT TROPONIN I: Troponin i, poc: 0 ng/mL (ref 0.00–0.08)

## 2013-04-25 MED ORDER — POTASSIUM CHLORIDE CRYS ER 20 MEQ PO TBCR: 40.0000 meq | EXTENDED_RELEASE_TABLET | Freq: Once | ORAL | Status: DC

## 2013-04-25 MED ORDER — ACETAMINOPHEN 500 MG PO TABS
1000.0000 mg | ORAL_TABLET | Freq: Once | ORAL | Status: AC
  Administered 2013-04-25: 1000 mg via ORAL
  Filled 2013-04-25: qty 2

## 2013-04-25 MED ORDER — POTASSIUM CHLORIDE 20 MEQ/15ML (10%) PO LIQD
40.0000 meq | Freq: Once | ORAL | Status: AC
  Administered 2013-04-25: 40 meq via ORAL
  Filled 2013-04-25: qty 30

## 2013-04-25 MED ORDER — HYDROCHLOROTHIAZIDE 12.5 MG PO CAPS
12.5000 mg | ORAL_CAPSULE | Freq: Once | ORAL | Status: AC
  Administered 2013-04-25: 12.5 mg via ORAL
  Filled 2013-04-25: qty 1

## 2013-04-25 MED ORDER — LISINOPRIL 10 MG PO TABS
10.0000 mg | ORAL_TABLET | Freq: Once | ORAL | Status: AC
  Administered 2013-04-25: 10 mg via ORAL
  Filled 2013-04-25: qty 1

## 2013-04-25 NOTE — ED Notes (Signed)
Pt escorted to discharge window. Verbalized understanding discharge instructions. In no acute distress. Vitals reviewed and WDL.  

## 2013-04-25 NOTE — ED Notes (Addendum)
Pt from home c/o  Of chest pain and headache onset this a.m while at work. Pt states she has been going through stressful situations at home with her brother at this time she is tearful. She has hx of hypertension in which she has been out of her meds since the beginning of this week.

## 2013-04-25 NOTE — ED Provider Notes (Signed)
CSN: [id number]     Arrival date & time 04/25/13  1240 History   First MD Initiated Contact with Patient 04/25/13 1249     Chief Complaint  Patient presents with  . Chest Pain  . Anxiety  . Hypertension   (Consider location/radiation/quality/duration/timing/severity/associated sxs/prior Treatment) HPI Pt is a 57yo female with hx of chest pain, anxiety, and HTN c/o substernal chest pain that started earlier this morning that was sharp and aching associated with aching throbbing frontal headache, 6/10.  Reports that she has a lot of stress at home and had some "family matters" take place last night adding to her stress.  Reports trying to go to work to today but could not stop thinking about things and work and felt her stress coming on.  Pt's BP was taken at work and reports "high" systolic/125.  Reports hx of similar symptoms in the past related to her stress. Has been out of her BP medication for 1 week and planned on refilling her medication today as she was just paid at work.  Reports hx of MIs on her father's side of the family but she herself denies cardiac hx.  States chest pain has nearly resolved since being in ER but still has a headache. Denies SOB.  Past Medical History  Diagnosis Date  . Hypertension    Past Surgical History  Procedure Laterality Date  . Cesarean section    . Cholecystectomy     Family History  Problem Relation Age of Onset  . Hypertension Mother   . Heart disease Father    History  Substance Use Topics  . Smoking status: Former Games developer  . Smokeless tobacco: Not on file  . Alcohol Use: Yes     Comment: ocassionally   OB History   Grav Para Term Preterm Abortions TAB SAB Ect Mult Living                 Review of Systems  Constitutional: Negative for fever and chills.  Eyes: Negative for visual disturbance.  Respiratory: Negative for cough and shortness of breath.   Cardiovascular: Positive for chest pain. Negative for palpitations and leg  swelling.  Gastrointestinal: Negative for nausea and vomiting.  Neurological: Positive for headaches.  All other systems reviewed and are negative.    Allergies  Review of patient's allergies indicates no known allergies.  Home Medications   Current Outpatient Rx  Name  Route  Sig  Dispense  Refill  . acetaminophen (TYLENOL) 500 MG tablet   Oral   Take 1,000 mg by mouth every 6 (six) hours as needed for pain.         [name] lisinopril-hydrochlorothiazide (PRINZIDE,ZESTORETIC) 10-12.5 MG per tablet   Oral   Take 2 tablets by mouth daily.          BP 195/113  Pulse 85  Temp(Src) 98.5 F (36.9 C) (Oral)  Resp 17  SpO2 100% Physical Exam  Nursing note and vitals reviewed. Constitutional: She appears well-developed and well-nourished. No distress.  Pt lying comfortably in exam bed, NAD.   HENT:  Head: Normocephalic and atraumatic.  Eyes: Conjunctivae are normal. No scleral icterus.  Neck: Normal range of motion. Neck supple. JVD present. No tracheal deviation present. No thyromegaly present.  Cardiovascular: Normal rate, regular rhythm and normal heart sounds.   Pulmonary/Chest: Effort normal and breath sounds normal. No stridor. No respiratory distress. She has no wheezes. She has no rales. She exhibits no tenderness.  Abdominal: Soft. Bowel sounds are normal.  She exhibits no distension and no mass. There is no tenderness. There is no rebound and no guarding.  Musculoskeletal: Normal range of motion.  Lymphadenopathy:    She has no cervical adenopathy.  Neurological: She is alert.  Skin: Skin is warm and dry. She is not diaphoretic.    ED Course  Procedures (including critical care time) Labs Review Labs Reviewed  BASIC METABOLIC PANEL - Abnormal; Notable for the following:    Potassium 3.2 (*)    Glucose, Bld 111 (*)    All other components within normal limits  CBC  POCT I-STAT TROPONIN I   Imaging Review No results found.  EKG Interpretation      Ventricular Rate:  95 PR Interval:  150 QRS Duration: 87 QT Interval:  346 QTC Calculation: 435 R Axis:   -37 Text Interpretation:  Sinus rhythm Left ventricular hypertrophy Borderline T abnormalities, lateral leads No significant change since last tracing            MDM   1. HTN (hypertension)   2. Headache   3. Chest pain   4. Anxiety   5. Hypokalemia    Pt presenting with elevated BP, likely due to increased stress and not taking BP medications as she is due for a refill.  Will perform cardiac workup and review previous medical records as pt has had multiple c/o chest pain in the past.  Tx in ED: lisinopril, HCTZ, and acetaminophen.   Troponin: negative CBC: unremarkable BMP: mild hypokalemia- K: 3.2  Pt states her headache has "eased up a bit" Pt is now resting in a darkened room, however BP remains elevated, last BP at 1400 was 196/107   Discussed pt with Dr. Denton Lank who agrees pt may be discharged home to f/u with PCP as chest pain atypical for ACS and not having any active chest pain.  EKG: consistent with previous.  No further workup needed a this time.  Pt strongly encouraged to fill her BP today as planned, also advised to f/u on her potassium levels with her PCP.  Strict return precautions given. Work note provided. Pt verbalized understanding and agreement with tx plan.   Junius Finner, PA-C 04/25/13 1453

## 2013-04-26 NOTE — ED Provider Notes (Signed)
Medical screening examination/treatment/procedure(s) were conducted as a shared visit with non-physician practitioner(s) and myself.  I personally evaluated the patient during the encounter.  EKG Interpretation     Ventricular Rate:  95 PR Interval:  150 QRS Duration: 87 QT Interval:  346 QTC Calculation: 435 R Axis:   -37 Text Interpretation:  Sinus rhythm Left ventricular hypertrophy Borderline T abnormalities, lateral leads No significant change since last tracing            Pt states hx htn, had bp checked at work and was high, recent noncompliance w bp meds/out.  No headache, no numbness/weakness. No cp or sob. Pt given her bp meds in ed, states feels at baseline, asymptomatic.     Suzi Roots, MD 04/26/13 [phone]

## 2013-05-04 ENCOUNTER — Emergency Department (INDEPENDENT_AMBULATORY_CARE_PROVIDER_SITE_OTHER)
Admission: EM | Admit: 2013-05-04 | Discharge: 2013-05-04 | Disposition: A | Discharge status: Home / Self Care | Payer: Self-pay | Source: Home / Self Care | Attending: Family Medicine | Admitting: Family Medicine

## 2013-05-04 ENCOUNTER — Encounter (HOSPITAL_COMMUNITY): Payer: Self-pay | Admitting: Emergency Medicine

## 2013-05-04 DIAGNOSIS — M26609 Unspecified temporomandibular joint disorder, unspecified side: Secondary | ICD-10-CM

## 2013-05-04 MED ORDER — DICLOFENAC POTASSIUM 50 MG PO TABS: 50.0000 mg | ORAL_TABLET | Freq: Three times a day (TID) | ORAL | Status: DC

## 2013-05-04 NOTE — ED Notes (Signed)
C/o right side jaw pain for a week now Patient states she has had this problem for awhile But this is the first time it has happened in a long time Tylenol was taking but no relief.

## 2013-05-04 NOTE — ED Provider Notes (Signed)
CSN: [id number]     Arrival date & time 05/04/13  2841 History   First MD Initiated Contact with Patient 05/04/13 1943     Chief Complaint  Patient presents with  . Jaw Pain   (Consider location/radiation/quality/duration/timing/severity/associated sxs/prior Treatment) Patient is a 57 y.o. female presenting with tooth pain. The history is provided by the patient.  Dental Pain Location:  Upper Upper teeth location: jaw locking and not closing , pain with rom. Quality:  Aching Severity:  Moderate Onset quality:  Sudden Duration:  1 day Progression:  Worsening Chronicity:  New   Past Medical History  Diagnosis Date  . Hypertension    Past Surgical History  Procedure Laterality Date  . Cesarean section    . Cholecystectomy     Family History  Problem Relation Age of Onset  . Hypertension Mother   . Heart disease Father    History  Substance Use Topics  . Smoking status: Former Games developer  . Smokeless tobacco: Not on file  . Alcohol Use: Yes     Comment: ocassionally   OB History   Grav Para Term Preterm Abortions TAB SAB Ect Mult Living                 Review of Systems  Constitutional: Negative.   HENT: Positive for dental problem.     Allergies  Review of patient's allergies indicates no known allergies.  Home Medications   Current Outpatient Rx  Name  Route  Sig  Dispense  Refill  . acetaminophen (TYLENOL) 500 MG tablet   Oral   Take 1,000 mg by mouth every 6 (six) hours as needed for pain.         [name] diclofenac (CATAFLAM) 50 MG tablet   Oral   Take 1 tablet (50 mg total) by mouth 3 (three) times daily. For jaw pain   30 tablet   0   . lisinopril-hydrochlorothiazide (PRINZIDE,ZESTORETIC) 10-12.5 MG per tablet   Oral   Take 2 tablets by mouth daily.          BP 167/88  Pulse 80  Temp(Src) 98.3 F (36.8 C) (Oral)  SpO2 100% Physical Exam  Nursing note and vitals reviewed. Constitutional: She is oriented to person, place, and time. She  appears well-developed and well-nourished.  HENT:  Head: Normocephalic.  Right Ear: External ear normal.  Left Ear: External ear normal.  Mouth/Throat: Oropharynx is clear and moist.  Tender right tmj to palp and with jaw opening., poor dentition  Eyes: Pupils are equal, round, and reactive to light.  Neck: Normal range of motion. Neck supple.  Neurological: She is alert and oriented to person, place, and time.  Skin: Skin is warm and dry.    ED Course  Procedures (including critical care time) Labs Review Labs Reviewed - No data to display Imaging Review No results found.  EKG Interpretation     Ventricular Rate:    PR Interval:    QRS Duration:   QT Interval:    QTC Calculation:   R Axis:     Text Interpretation:              MDM      Linna Hoff, MD 05/04/13 2017

## 2013-12-27 ENCOUNTER — Emergency Department (HOSPITAL_COMMUNITY): Payer: Self-pay

## 2013-12-27 ENCOUNTER — Emergency Department (HOSPITAL_COMMUNITY)
Admission: EM | Admit: 2013-12-27 | Discharge: 2013-12-27 | Disposition: A | Discharge status: Home / Self Care | Payer: Self-pay | Attending: Emergency Medicine | Admitting: Emergency Medicine

## 2013-12-27 ENCOUNTER — Encounter (HOSPITAL_COMMUNITY): Payer: Self-pay | Admitting: Emergency Medicine

## 2013-12-27 DIAGNOSIS — R51 Headache: Secondary | ICD-10-CM | POA: Insufficient documentation

## 2013-12-27 DIAGNOSIS — R0602 Shortness of breath: Secondary | ICD-10-CM | POA: Insufficient documentation

## 2013-12-27 DIAGNOSIS — Z87891 Personal history of nicotine dependence: Secondary | ICD-10-CM | POA: Insufficient documentation

## 2013-12-27 DIAGNOSIS — I10 Essential (primary) hypertension: Secondary | ICD-10-CM | POA: Insufficient documentation

## 2013-12-27 DIAGNOSIS — R0789 Other chest pain: Secondary | ICD-10-CM | POA: Insufficient documentation

## 2013-12-27 DIAGNOSIS — R519 Headache, unspecified: Secondary | ICD-10-CM

## 2013-12-27 LAB — COMPREHENSIVE METABOLIC PANEL
ALT: 20 U/L (ref 0–35)
AST: 17 U/L (ref 0–37)
Albumin: 4.1 g/dL (ref 3.5–5.2)
Alkaline Phosphatase: 92 U/L (ref 39–117)
Anion gap: 15 (ref 5–15)
BUN: 12 mg/dL (ref 6–23)
CO2: 25 mEq/L (ref 19–32)
Calcium: 9.6 mg/dL (ref 8.4–10.5)
Chloride: 100 mEq/L (ref 96–112)
Creatinine, Ser: 0.65 mg/dL (ref 0.50–1.10)
GFR calc Af Amer: >90 mL/min (ref >90)
GFR calc non Af Amer: >90 mL/min (ref >90)
Glucose, Bld: 82 mg/dL (ref 70–99)
Potassium: 3.5 mEq/L — ABNORMAL LOW (ref 3.7–5.3)
Sodium: 140 mEq/L (ref 137–147)
Total Bilirubin: <0.2 mg/dL — ABNORMAL LOW (ref 0.3–1.2)
Total Protein: 7.6 g/dL (ref 6.0–8.3)

## 2013-12-27 LAB — CBC WITH DIFFERENTIAL/PLATELET
Basophils Absolute: 0 10*3/uL (ref 0.0–0.1)
Basophils Relative: 0 % (ref 0–1)
Eosinophils Absolute: 0.1 10*3/uL (ref 0.0–0.7)
Eosinophils Relative: 1 % (ref 0–5)
HCT: 37.5 % (ref 36.0–46.0)
Hemoglobin: 12.5 g/dL (ref 12.0–15.0)
Lymphocytes Relative: 34 % (ref 12–46)
Lymphs Abs: 2.4 10*3/uL (ref 0.7–4.0)
MCH: 27.7 pg (ref 26.0–34.0)
MCHC: 33.3 g/dL (ref 30.0–36.0)
MCV: 83.1 fL (ref 78.0–100.0)
Monocytes Absolute: 0.4 10*3/uL (ref 0.1–1.0)
Monocytes Relative: 6 % (ref 3–12)
Neutro Abs: 4.1 10*3/uL (ref 1.7–7.7)
Neutrophils Relative %: 59 % (ref 43–77)
Platelets: 303 10*3/uL (ref 150–400)
RBC: 4.51 MIL/uL (ref 3.87–5.11)
RDW: 13.1 % (ref 11.5–15.5)
WBC: 7 10*3/uL (ref 4.0–10.5)

## 2013-12-27 LAB — TROPONIN I: Troponin I: <0.3 ng/mL (ref <0.30)

## 2013-12-27 MED ORDER — LISINOPRIL-HYDROCHLOROTHIAZIDE 10-12.5 MG PO TABS: 2.0000 | ORAL_TABLET | Freq: Every day | ORAL | Status: DC

## 2013-12-27 MED ORDER — SODIUM CHLORIDE 0.9 % IV BOLUS (SEPSIS)
1000.0000 mL | Freq: Once | INTRAVENOUS | Status: AC
  Administered 2013-12-27: 1000 mL via INTRAVENOUS

## 2013-12-27 MED ORDER — METOCLOPRAMIDE HCL 5 MG/ML IJ SOLN
5.0000 mg | Freq: Once | INTRAMUSCULAR | Status: AC
  Administered 2013-12-27: 5 mg via INTRAVENOUS
  Filled 2013-12-27: qty 2

## 2013-12-27 MED ORDER — HYDRALAZINE HCL 20 MG/ML IJ SOLN
5.0000 mg | Freq: Once | INTRAMUSCULAR | Status: AC
  Administered 2013-12-27: 5 mg via INTRAVENOUS
  Filled 2013-12-27 (×2): qty 0.25

## 2013-12-27 MED ORDER — DIPHENHYDRAMINE HCL 50 MG/ML IJ SOLN
12.5000 mg | Freq: Once | INTRAMUSCULAR | Status: AC
  Administered 2013-12-27: 12.5 mg via INTRAVENOUS
  Filled 2013-12-27: qty 1

## 2013-12-27 NOTE — ED Notes (Signed)
Pt ambulated to BR and back to room w/o assistance 

## 2013-12-27 NOTE — ED Notes (Signed)
Pt from work, reports SOB this am with HA. PT adds that her pressures at work were over 200. Pt denies emesis or diarrhea but had mild nausea this am. Pt denies CP now, but had CP "A few days ago." Pt is taking her BP meds as directed. Pt took no meds for HA. PT is A&O and in NAD

## 2013-12-27 NOTE — Discharge Instructions (Signed)
Hypertension Hypertension, commonly called high blood pressure, is when the force of blood pumping through your arteries is too strong. Your arteries are the blood vessels that carry blood from your heart throughout your body. A blood pressure reading consists of a higher number over a lower number, such as 110/72. The higher number (systolic) is the pressure inside your arteries when your heart pumps. The lower number (diastolic) is the pressure inside your arteries when your heart relaxes. Ideally you want your blood pressure below 120/80. Hypertension forces your heart to work harder to pump blood. Your arteries may become narrow or stiff. Having hypertension puts you at risk for heart disease, stroke, and other problems.  RISK FACTORS Some risk factors for high blood pressure are controllable. Others are not.  Risk factors you cannot control include:   Race. You may be at higher risk if you are African American.  Age. Risk increases with age.  Gender. Men are at higher risk than women before age 14 years. After age 49, women are at higher risk than men. Risk factors you can control include:  Not getting enough exercise or physical activity.  Being overweight.  Getting too much fat, sugar, calories, or salt in your diet.  Drinking too much alcohol. SIGNS AND SYMPTOMS Hypertension does not usually cause signs or symptoms. Extremely high blood pressure (hypertensive crisis) may cause headache, anxiety, shortness of breath, and nosebleed. DIAGNOSIS  To check if you have hypertension, your health care provider will measure your blood pressure while you are seated, with your arm held at the level of your heart. It should be measured at least twice using the same arm. Certain conditions can cause a difference in blood pressure between your right and left arms. A blood pressure reading that is higher than normal on one occasion does not mean that you need treatment. If one blood pressure reading  is high, ask your health care provider about having it checked again. TREATMENT  Treating high blood pressure includes making lifestyle changes and possibly taking medication. Living a healthy lifestyle can help lower high blood pressure. You may need to change some of your habits. Lifestyle changes may include:  Following the DASH diet. This diet is high in fruits, vegetables, and whole grains. It is low in salt, red meat, and added sugars.  Getting at least 2 1/2 hours of brisk physical activity every week.  Losing weight if necessary.  Not smoking.  Limiting alcoholic beverages.  Learning ways to reduce stress. If lifestyle changes are not enough to get your blood pressure under control, your health care provider may prescribe medicine. You may need to take more than one. Work closely with your health care provider to understand the risks and benefits. HOME CARE INSTRUCTIONS  Have your blood pressure rechecked as directed by your health care provider.   Only take medicine as directed by your health care provider. Follow the directions carefully. Blood pressure medicines must be taken as prescribed. The medicine does not work as well when you skip doses. Skipping doses also puts you at risk for problems.   Do not smoke.   Monitor your blood pressure at home as directed by your health care provider. SEEK MEDICAL CARE IF:   You think you are having a reaction to medicines taken.  You have recurrent headaches or feel dizzy.  You have swelling in your ankles.  You have trouble with your vision. SEEK IMMEDIATE MEDICAL CARE IF:  You develop a severe headache or  confusion.  You have unusual weakness, numbness, or feel faint.  You have severe chest or abdominal pain.  You vomit repeatedly.  You have trouble breathing. MAKE SURE YOU:   Understand these instructions.  Will watch your condition.  Will get help right away if you are not doing well or get  worse. Document Released: 06/11/2005 Document Revised: 06/16/2013 Document Reviewed: 04/03/2013 [facility] Patient Information 2015 [place], [place]. This information is not intended to replace advice given to you by your health care provider. Make sure you discuss any questions you have with your health care provider.   Emergency Department Resource Guide 1) Find a Doctor and Pay Out of Pocket Although you won't have to find out who is covered by your insurance plan, it is a good idea to ask around and get recommendations. You will then need to call the office and see if the doctor you have chosen will accept you as a new patient and what types of options they offer for patients who are self-pay. Some doctors offer discounts or will set up payment plans for their patients who do not have insurance, but you will need to ask so you aren't surprised when you get to your appointment.  2) Contact Your Local Health Department Not all health departments have doctors that can see patients for sick visits, but many do, so it is worth a call to see if yours does. If you don't know where your local health department is, you can check in your phone book. The CDC also has a tool to help you locate your state's health department, and many state websites also have listings of all of their local health departments.  3) Find a [facility] If your illness is not likely to be very severe or complicated, you may want to try a walk in clinic. These are popping up all over the country in pharmacies, drugstores, and shopping centers. They're usually staffed by nurse practitioners or physician assistants that have been trained to treat common illnesses and complaints. They're usually fairly quick and inexpensive. However, if you have serious medical issues or chronic medical problems, these are probably not your best option.  No Primary Care Doctor: - Call Health Connect at  [phone] - they can help you locate a primary  care doctor that  accepts your insurance, provides certain services, etc. - Physician Referral Service- [phone]  Chronic Pain Problems: Organization         Address  Phone   Notes  [facility]  [phone] Patients need to be referred by their primary care doctor.   Medication Assistance: Organization         Address  Phone   Notes  [facility] Medication [facility] [address] [place], [place] [address] [phone] --Must be a resident of [facility] -- Must have NO insurance coverage whatsoever (no Medicaid/ Medicare, etc.) -- The pt. MUST have a primary care doctor that directs their care regularly and follows them in the community   [facility]ist  [phone]   [employer]  [phone]    Agencies that provide inexpensive medical care: Organization         Address  Phone   Notes  [place]  [phone]   [facility]    [phone]   [facility] [place], [place] [address] [phone]   [place]  [address] [place] [phone]   Planned Parenthood    [phone]   [facility]    [phone]   [place] and [place] [address], [place] Phone:  [phone], Fax:  [phone] Hours of Operation:  9 am - 6 pm, M-F.  Also accepts Medicaid/Medicare and self-pay.  [facility] for [address], [address], [place] Phone: [phone], Fax: [phone]. Hours of Operation:  8:30 am - 5:30 pm, M-F.  Also accepts Medicaid and self-pay.  [facility] [place] [address], [place] Phone: [phone]   [place], [place], [place] [phone], Ext. [phone] Mondays & Thursdays: 7-9 AM.  First 15 patients are seen on a first come, first serve basis.    [place]  Providers:  Organization         Address  Phone   Notes  [facility] [address],  [phone] Also accepts self-pay patients.  [facility] [address], [place]  [phone]   [place], [address], [place] [phone]   [facility] [address] [place] [phone]   Lucianne Lei [address], [place]   [phone] Only accepts [place] Access [place] patients after they have their name applied to their card.   Self-Pay (no insurance) in [facility]:  Organization         Address  Phone   Notes  Sickle Cell Patients, [facility] Internal Medicine [place] [phone]   [facility] Urgent Care [place] [phone]   [facility] [place]  [place], [place], [place] [phone]   [facility]/Dr. Osei-Bonsu  [address] [place] or [address], [place] [phone] Phone number for both [place] and [place] locations is the same.  [facility] [address] [place] [phone]   [facility] [address] [place] or [address] [phone]   [facility] [address], [place] [phone], phone; [phone], fax Sees patients 1st and 3rd Saturday of every month.  Must not qualify for public or private insurance (i.e. Medicaid, Medicare, [place] Health Choice, Veterans' Benefits)  Household income should be no more than 200% of the poverty level The clinic cannot treat you if you are pregnant or think you are pregnant  Sexually transmitted diseases are not treated at the clinic.    Dental Care: Organization         Address  Phone  Notes  [facility] Department of [facility] [phone] Accepts children up to age 23 who are enrolled in [place] or [place]; pregnant women with a Medicaid card; and children who have applied for Medicaid or [place] Health Choice, but were declined, whose parents can pay a reduced fee at time of service.  [name] Department of [facility]  [address], [place] [phone] Accepts children up to age 47 who are enrolled in [place] or [place]; pregnant women with a Medicaid card; and children who have applied for Medicaid  or [place] Health Choice, but were declined, whose parents can pay a reduced fee at time of service.  [place] Adult [facility] Access PRO[facility]  [place] [phone] Patients are seen by appointment only. Walk-ins are not accepted. [place] will see patients 71 years of age and older. Monday - Tuesday (8am-5pm) Most Wednesdays (8:30-5pm) $30 per visit, cash only  [facility] Adult [facility] Access PROGRAM  [address], [facility] [phone] Patients are seen by appointment only. Walk-ins are not accepted. [place] will see patients 30 years of age and older. One Wednesday Evening (Monthly: Volunteer Based).  $30 per visit, cash only  [place]  [phone] for adults; Children under age 72, call Graduate Pediatric Dentistry at [phone]. Children aged 16-14, please call [phone] to request a pediatric application.  Dental services are provided in all areas of dental care including fillings, crowns and bridges, complete and partial dentures, implants, gum treatment, root canals, and extractions. Preventive care is also provided. Treatment is provided to both adults and children. Patients are selected via a lottery and there is often a waiting list.   [facility] [address], [place]  [phone] [email or web address]   [facility]  [address] [place], [place] [phone], Ext. [phone] Second and Fourth Thursday of each month, opens at 6:30 AM; Clinic ends at 9 AM.  Patients are seen on a first-come first-served basis, and a limited number are seen during each clinic.   [facility]  [address] Hillard Danker [place], [place] [phone]   Eligibility Requirements You must have lived in [place], [place], or [place] counties for at least the last three months.   You cannot be eligible for state or federal sponsored [employer], including [employer], [place], or [employer].   You generally cannot be eligible for healthcare insurance through your employer.    How to apply: Eligibility screenings are held every Tuesday and Wednesday afternoon from 1:00 pm until 4:00 pm. You do not need an appointment for the interview!  [facility] [address], [place], [place]   [place]  [place] Department  [place]  [phone]    Behavioral Health Resources in the Community: Intensive Outpatient Programs Organization         Address  Phone  Notes  [place]. [address], [place], [place] [phone]   [facility] Outpatient [address] [place], [place]   ADS: Alcohol & Drug Svcs [address], [place], [place]   [place] [address],  [place], [place] or [phone]   Substance Abuse Resources Organization         Address  Phone  Notes  Alcohol and Drug Services  [phone]   [place]  [phone]   [place]   Chinita Pester  [phone]   Residential & Outpatient Substance Abuse Program  [phone]   Psychological Services Organization         Address  Phone  Notes  [facility] [place]  [place]  [phone]   [place] [address] [place] or [phone]    Mobile Crisis Teams Organization         Address  Phone  Notes  [facility], Mobile Crisis Care Unit  [phone]   [facility]  [address] [place], [place]   [facility] [address] [place] [phone]    Self-Help/Support Groups Organization         Address  Phone             Notes  [place]. of [place] - variety of support groups  [place] Call for more information  Narcotics Anonymous (NA), [facility] [address], [employer] [place]  2 meetings at this location   Special educational needs teacher         Address  Phone  Notes  ASAP Residential Treatment [place],    [place]  [phone]   [facility]  [address] [place] [phone], [place], [place]   [place], [place] [phone] Admissions: 8am-3pm M-F  Incentives Substance [place] [address]    [place], [place] [phone]   [facility] [address] [place], [place], [place]   [facility] [address],  [place], [place]   [facility] - Intensive Outpatient [address] Kristeen Mans [address], [place], [place]   [facility] ([place].,  [place], [place] [phone] or [phone]   [facility] Services (RTS) [address], [place], [place] Accepts Medicaid  [facility] [place] [address],  [place] [phone] Substance Abuse/Addiction Treatment   [facility] Organization         Address  Phone  Notes  [facility]  [phone]   Domenic Schwab, PhD [address] [place], [place]   [phone] or [phone]    [place], [place] [phone]   [facility] [address] [place], [place] [phone] Insurance/Medicaid/sponsorship through [facility] and Families [address], [address]                                    [place], [place] [phone] [place] [address], [place] [phone]    Dr. Adele Schilder  [phone]   [facility] Dept. 1) [address] [place] 2) [place] 3)  [place] [address], [place] [phone]  [phone]   [place] [phone] or [phone] (After Hours)

## 2013-12-27 NOTE — ED Notes (Addendum)
Pt c/o headache since waking up and SOB that started at work this morning. Pt has HTN and BP is high today despite taking medications. Pt also c/o nausea. Pt also c/o left sided facial tingling that started this morning.

## 2013-12-27 NOTE — ED Notes (Signed)
Pt ambulated to [facility] and back w/o assistance 

## 2013-12-27 NOTE — ED Notes (Signed)
Patient transported to CT 

## 2013-12-27 NOTE — ED Provider Notes (Signed)
CSN: [id number]     Arrival date & time 12/27/13  1339 History   First MD Initiated Contact with Patient 12/27/13 1452     Chief Complaint  Patient presents with  . Shortness of Breath  . Headache  . Hypertension     (Consider location/radiation/quality/duration/timing/severity/associated sxs/prior Treatment) Patient is a 58 y.o. female presenting with headaches and hypertension. The history is provided by the patient.  Headache Pain location:  Generalized Quality:  Dull Severity currently:  4/10 Severity at highest:  9/10 Onset quality:  Gradual Duration:  7 hours Timing:  Constant Progression:  Improving Chronicity:  New Similar to prior headaches: yes   Context comment:  Awoke w/ the HA Relieved by:  Nothing Worsened by:  Nothing tried Ineffective treatments:  None tried Associated symptoms: no back pain, no congestion, no diarrhea, no dizziness, no pain, no fatigue and no nausea   Hypertension Associated symptoms include headaches.    Past Medical History  Diagnosis Date  . Hypertension    Past Surgical History  Procedure Laterality Date  . Cesarean section    . Cholecystectomy     Family History  Problem Relation Age of Onset  . Hypertension Mother   . Heart disease Father    History  Substance Use Topics  . Smoking status: Former Research scientist (life sciences)  . Smokeless tobacco: Not on file  . Alcohol Use: Yes     Comment: ocassionally   OB History   Grav Para Term Preterm Abortions TAB SAB Ect Mult Living                 Review of Systems  Constitutional: Negative for fatigue.  HENT: Negative for congestion and drooling.   Eyes: Negative for pain.  Respiratory: Positive for chest tightness.   Gastrointestinal: Negative for nausea and diarrhea.  Genitourinary: Negative for dysuria and hematuria.  Musculoskeletal: Negative for back pain and gait problem.  Skin: Negative for color change.  Neurological: Positive for headaches. Negative for dizziness.  Hematological:  Negative for adenopathy.  Psychiatric/Behavioral: Negative for behavioral problems.  All other systems reviewed and are negative.     Allergies  Review of patient's allergies indicates no known allergies.  Home Medications   Prior to Admission medications   Medication Sig Start Date End Date Taking? Authorizing Provider  lisinopril-hydrochlorothiazide (PRINZIDE,ZESTORETIC) 10-12.5 MG per tablet Take 2 tablets by mouth at bedtime.    Yes Historical Provider, MD   BP 205/112  Pulse 86  Temp(Src) 99.2 F (37.3 C) (Oral)  Resp 20  SpO2 100% Physical Exam  Nursing note and vitals reviewed. Constitutional: She is oriented to person, place, and time. She appears well-developed and well-nourished.  HENT:  Head: Normocephalic and atraumatic.  Mouth/Throat: Oropharynx is clear and moist. No oropharyngeal exudate.  Eyes: Conjunctivae and EOM are normal. Pupils are equal, round, and reactive to light.  Neck: Normal range of motion. Neck supple.  Cardiovascular: Normal rate, regular rhythm, normal heart sounds and intact distal pulses.  Exam reveals no gallop and no friction rub.   No murmur heard. Pulmonary/Chest: Effort normal and breath sounds normal. No respiratory distress. She has no wheezes.  Abdominal: Soft. Bowel sounds are normal. There is no tenderness. There is no rebound and no guarding.  Musculoskeletal: Normal range of motion. She exhibits no edema and no tenderness.  Neurological: She is alert and oriented to person, place, and time.  alert, oriented x3 speech: normal in context and clarity memory: intact grossly cranial nerves II-XII: intact motor  strength: full proximally and distally no involuntary movements or tremors sensation: intact to light touch diffusely  cerebellar: finger-to-nose and heel-to-shin intact gait: normal forwards and backwards, normal tandem gait   Skin: Skin is warm and dry.  Psychiatric: She has a normal mood and affect. Her behavior is  normal.    ED Course  Procedures (including critical care time) Labs Review Labs Reviewed  COMPREHENSIVE METABOLIC PANEL - Abnormal; Notable for the following:    Potassium 3.5 (*)    Total Bilirubin <0.2 (*)    All other components within normal limits  CBC WITH DIFFERENTIAL  TROPONIN I    Imaging Review Dg Chest 2 View  12/27/2013   CLINICAL DATA:  Chest pain.  Ex-smoker.  EXAM: CHEST  2 VIEW  COMPARISON:  01/31/2013.  FINDINGS: Normal sized heart. Clear lungs. Bilateral nipple shadows. Cervical spine fixation hardware. Mild scoliosis and minimal thoracic spine degenerative changes.  IMPRESSION: No acute abnormality.   Electronically Signed   By: Enrique Sack M.D.   On: 12/27/2013 15:52   Ct Head Wo Contrast  12/27/2013   CLINICAL DATA:  Headache.  Hypertension.  EXAM: CT HEAD WITHOUT CONTRAST  TECHNIQUE: Contiguous axial images were obtained from the base of the skull through the vertex without intravenous contrast.  COMPARISON:  01/25/2012  FINDINGS: No change. The brain has normal appearance without evidence of atrophy, old or acute infarction, mass lesion, hemorrhage, hydrocephalus or extra-axial collection. Some asymmetry of the lateral ventricles is within normal limits. The calvarium is unremarkable. Sinuses, middle ears and mastoids are clear.  IMPRESSION: Normal head CT.  No cause of headache identified.   Electronically Signed   By: Nelson Chimes M.D.   On: 12/27/2013 15:45     EKG Interpretation   Date/Time:  Sunday December 27 2013 13:51:52 EDT Ventricular Rate:  85 PR Interval:  156 QRS Duration: 91 QT Interval:  362 QTC Calculation: 430 R Axis:   -26 Text Interpretation:  Sinus rhythm Borderline left axis deviation Abnormal  R-wave progression, late transition Baseline wander in lead(s) II III aVF  No significant change since last tracing Confirmed by Leontina Skidmore  MD,  Dalphine Cowie ([id number]) on 12/27/2013 2:58:00 PM      MDM   Final diagnoses:  Headache, unspecified headache  type  Essential hypertension    3:09 PM 58 y.o. female with a history of hypertension presents with multiple complaints. She states that she has had some fleeting chest tightness yesterday and the day before. The initial episode was when she was picking up her niece and it lasted for several seconds and yesterday it occurred spontaneously again lasting only seconds. She denies any chest pain today but did have some mild shortness of breath this morning which has resolved. She states that she woke up with a global headache this morning which is similar to previous headaches and is currently a 4/10. She had a friend check her blood pressure while at work and it was found to be around 200/120. She presents now due to elevated blood pressure. She takes her blood pressure medicine at night and has not missed a dose recently. With the exception of hypertension her vital signs are otherwise unremarkable and she has a normal neurologic exam. Will get screening labs and imaging. Will treat headache. Doubt hypertensive emergency.   4:55 PM: I interpreted/reviewed the labs and/or imaging which were non-contributory. Pt feeling much better, BP has dec. Will provide Rx for refill of her bp meds. I have discussed the  diagnosis/risks/treatment options with the patient and believe the pt to be eligible for discharge home to follow-up with and establish w/ a pcp. We also discussed returning to the ED immediately if new or worsening sx occur. We discussed the sx which are most concerning (e.g., worsening HA, fever,) that necessitate immediate return. Medications administered to the patient during their visit and any new prescriptions provided to the patient are listed below.  Medications given during this visit Medications  sodium chloride 0.9 % bolus 1,000 mL (1,000 mLs Intravenous New Bag/Given 12/27/13 1520)  metoCLOPramide (REGLAN) injection 5 mg (5 mg Intravenous Given 12/27/13 1515)  diphenhydrAMINE (BENADRYL) injection  12.5 mg (12.5 mg Intravenous Given 12/27/13 1515)  hydrALAZINE (APRESOLINE) injection 5 mg (5 mg Intravenous Given 12/27/13 1518)    Discharge Medication List as of 12/27/2013  4:57 PM    START taking these medications   Details  !! lisinopril-hydrochlorothiazide (PRINZIDE) 10-12.5 MG per tablet Take 2 tablets by mouth at bedtime., Starting 12/27/2013, Until Discontinued, Print     !! - Potential duplicate medications found. Please discuss with provider.       Blanchard Kelch, MD 12/28/13 [place]

## 2014-01-18 ENCOUNTER — Encounter (HOSPITAL_COMMUNITY): Payer: Self-pay | Admitting: Emergency Medicine

## 2014-01-18 ENCOUNTER — Emergency Department (HOSPITAL_COMMUNITY)
Admission: EM | Admit: 2014-01-18 | Discharge: 2014-01-18 | Disposition: A | Discharge status: Home / Self Care | Payer: Self-pay | Attending: Emergency Medicine | Admitting: Emergency Medicine

## 2014-01-18 DIAGNOSIS — M545 Low back pain, unspecified: Secondary | ICD-10-CM | POA: Insufficient documentation

## 2014-01-18 DIAGNOSIS — Z79899 Other long term (current) drug therapy: Secondary | ICD-10-CM | POA: Insufficient documentation

## 2014-01-18 DIAGNOSIS — I10 Essential (primary) hypertension: Secondary | ICD-10-CM | POA: Insufficient documentation

## 2014-01-18 DIAGNOSIS — Z87891 Personal history of nicotine dependence: Secondary | ICD-10-CM | POA: Insufficient documentation

## 2014-01-18 DIAGNOSIS — M5430 Sciatica, unspecified side: Secondary | ICD-10-CM | POA: Insufficient documentation

## 2014-01-18 DIAGNOSIS — M5441 Lumbago with sciatica, right side: Secondary | ICD-10-CM

## 2014-01-18 MED ORDER — HYDROCODONE-ACETAMINOPHEN 5-325 MG PO TABS
2.0000 | ORAL_TABLET | Freq: Once | ORAL | Status: AC
  Administered 2014-01-18: 2 via ORAL
  Filled 2014-01-18: qty 2

## 2014-01-18 MED ORDER — DIAZEPAM 5 MG PO TABS: 5.0000 mg | ORAL_TABLET | Freq: Two times a day (BID) | ORAL | Status: DC

## 2014-01-18 MED ORDER — ONDANSETRON 8 MG PO TBDP
8.0000 mg | ORAL_TABLET | Freq: Once | ORAL | Status: AC
  Administered 2014-01-18: 8 mg via ORAL
  Filled 2014-01-18: qty 1

## 2014-01-18 MED ORDER — HYDROCODONE-ACETAMINOPHEN 5-325 MG PO TABS: 1.0000 | ORAL_TABLET | Freq: Four times a day (QID) | ORAL | Status: DC | PRN

## 2014-01-18 NOTE — Discharge Instructions (Signed)
Back Pain, Adult Back pain is very common. The pain often gets better over time. The cause of back pain is usually not dangerous. Most people can learn to manage their back pain on their own.  HOME CARE   Stay active. Start with short walks on flat ground if you can. Try to walk farther each day.  Do not sit, drive, or stand in one place for more than 30 minutes. Do not stay in bed.  Do not avoid exercise or work. Activity can help your back heal faster.  Be careful when you bend or lift an object. Bend at your knees, keep the object close to you, and do not twist.  Sleep on a firm mattress. Lie on your side, and bend your knees. If you lie on your back, put a pillow under your knees.  Only take medicines as told by your doctor.  Put ice on the injured area.  Put ice in a plastic bag.  Place a towel between your skin and the bag.  Leave the ice on for 15-20 minutes, 03-04 times a day for the first 2 to 3 days. After that, you can switch between ice and heat packs.  Ask your doctor about back exercises or massage.  Avoid feeling anxious or stressed. Find good ways to deal with stress, such as exercise. GET HELP RIGHT AWAY IF:   Your pain does not go away with rest or medicine.  Your pain does not go away in 1 week.  You have new problems.  You do not feel well.  The pain spreads into your legs.  You cannot control when you poop (bowel movement) or pee (urinate).  Your arms or legs feel weak or lose feeling (numbness).  You feel sick to your stomach (nauseous) or throw up (vomit).  You have belly (abdominal) pain.  You feel like you may pass out (faint). MAKE SURE YOU:   Understand these instructions.  Will watch your condition.  Will get help right away if you are not doing well or get worse. Document Released: 11/28/2007 Document Revised: 09/03/2011 Document Reviewed: 10/13/2013 [facility] Patient Information 2015 [place], [place]. This information is not intended  to replace advice given to you by your health care provider. Make sure you discuss any questions you have with your health care provider.  Back Exercises Back exercises help treat and prevent back injuries. The goal is to increase your strength in your belly (abdominal) and back muscles. These exercises can also help with flexibility. Start these exercises when told by your doctor. HOME CARE Back exercises include: Pelvic Tilt.  Lie on your back with your knees bent. Tilt your pelvis until the lower part of your back is against the floor. Hold this position 5 to 10 sec. Repeat this exercise 5 to 10 times. Knee to Chest.  Pull 1 knee up against your chest and hold for 20 to 30 seconds. Repeat this with the other knee. This may be done with the other leg straight or bent, whichever feels better. Then, pull both knees up against your chest. Sit-Ups or Curl-Ups.  Bend your knees 90 degrees. Start with tilting your pelvis, and do a partial, slow sit-up. Only lift your upper half 30 to 45 degrees off the floor. Take at least 2 to 3 seonds for each sit-up. Do not do sit-ups with your knees out straight. If partial sit-ups are difficult, simply do the above but with only tightening your belly (abdominal) muscles and holding it as  told. Hip-Lift.  Lie on your back with your knees flexed 90 degrees. Push down with your feet and shoulders as you raise your hips 2 inches off the floor. Hold for 10 seconds, repeat 5 to 10 times. Back Arches.  Lie on your stomach. Prop yourself up on bent elbows. Slowly press on your hands, causing an arch in your low back. Repeat 3 to 5 times. Shoulder-Lifts.  Lie face down with arms beside your body. Keep hips and belly pressed to floor as you slowly lift your head and shoulders off the floor. Do not overdo your exercises. Be careful in the beginning. Exercises may cause you some mild back discomfort. If the pain lasts for more than 15 minutes, stop the exercises until  you see your doctor. Improvement with exercise for back problems is slow.  Document Released: 07/14/2010 Document Revised: 09/03/2011 Document Reviewed: 04/12/2011 [facility] Patient Information 2015 [place], [place]. This information is not intended to replace advice given to you by your health care provider. Make sure you discuss any questions you have with your health care provider.

## 2014-01-18 NOTE — Progress Notes (Signed)
[facility] provided pt with a list of primary care resources and a [facility] [employer] Card application to help patient establish primary care.  °

## 2014-01-18 NOTE — ED Provider Notes (Signed)
CSN: [id number]     Arrival date & time 01/18/14  1156 History  This chart was scribed for non-physician practitioner, Cleatrice Burke, PA-C working with Arbie Cookey, MD, by Erling Conte, ED Scribe. This patient was seen in room [place] and the patient's care was started at 2:40 PM.     Chief Complaint  Patient presents with  . Back Pain  . Leg Pain     The history is provided by the patient. No language interpreter was used.   HPI Comments: [name] is a 58 y.o. female with a h/o HTN who presents to the Emergency Department complaining of constant, gradually worsening, moderate, "sharp", "11/10" right lower back pain that radiates down her right hip and leg onset two days ago. She states the pain began when she was sitting and went to stand up and began having the sharp shooting pain. She states she has had similar pain before but states it was many years ago. Patient states she has no history of cancer or drug use. Patient denies any urinary or bowel incontinence, fever, or chills.    Past Medical History  Diagnosis Date  . Hypertension    Past Surgical History  Procedure Laterality Date  . Cesarean section    . Cholecystectomy     Family History  Problem Relation Age of Onset  . Hypertension Mother   . Heart disease Father    History  Substance Use Topics  . Smoking status: Former Research scientist (life sciences)  . Smokeless tobacco: Not on file  . Alcohol Use: Yes     Comment: ocassionally   OB History   Grav Para Term Preterm Abortions TAB SAB Ect Mult Living                 Review of Systems  Constitutional: Negative for fever and chills.  Gastrointestinal:       No bowel incontinence  Genitourinary:       No urinary incontinence  Musculoskeletal: Positive for arthralgias (right hip, right leg) and back pain (right lower ).  All other systems reviewed and are negative.     Allergies  Review of patient's allergies indicates no known allergies.  Home  Medications   Prior to Admission medications   Medication Sig Start Date End Date Taking? Authorizing Provider  lisinopril-hydrochlorothiazide (PRINZIDE) 10-12.5 MG per tablet Take 2 tablets by mouth at bedtime. 12/27/13   Blanchard Kelch, MD  lisinopril-hydrochlorothiazide (PRINZIDE,ZESTORETIC) 10-12.5 MG per tablet Take 2 tablets by mouth at bedtime.     Historical Provider, MD   Triage Vitals: BP 139/89  Pulse 95  Temp(Src) 98.2 F (36.8 C) (Oral)  Resp 18  SpO2 100%  Physical Exam  Nursing note and vitals reviewed. Constitutional: She is oriented to person, place, and time. She appears well-developed and well-nourished. No distress.  HENT:  Head: Normocephalic and atraumatic.  Right Ear: External ear normal.  Left Ear: External ear normal.  Nose: Nose normal.  Mouth/Throat: Oropharynx is clear and moist.  Eyes: Conjunctivae are normal.  Neck: Normal range of motion.  Cardiovascular: Normal rate, regular rhythm and normal heart sounds.   Pulses:      Posterior tibial pulses are 2+ on the right side, and 2+ on the left side.  Pulmonary/Chest: Effort normal and breath sounds normal. No stridor. No respiratory distress. She has no wheezes. She has no rales.  Abdominal: Soft. She exhibits no distension.  Musculoskeletal: Normal range of motion.  Lumbar back: She exhibits tenderness. She exhibits no bony tenderness and no deformity.  Tenderness to palpation over right back. No deformity, step offs or bony tenderness  Neurological: She is alert and oriented to person, place, and time. She has normal strength and normal reflexes.  Reflex Scores:      Patellar reflexes are 2+ on the right side and 2+ on the left side. Strength 5/5 in all extremities   Skin: Skin is warm and dry. She is not diaphoretic. No erythema.  Psychiatric: She has a normal mood and affect. Her behavior is normal.    ED Course  Procedures (including critical care time)  DIAGNOSTIC STUDIES: Oxygen  Saturation is 100% on RA, normal by my interpretation.    COORDINATION OF CARE: 2:48 PM- Will discharge pt with Vicodin and Valium. Pt advised of plan for treatment and pt agrees.     Labs Review Labs Reviewed - No data to display  Imaging Review No results found.   EKG Interpretation None      MDM   Final diagnoses:  Right-sided low back pain with right-sided sciatica   Patient with back pain.  No neurological deficits and normal neuro exam.  Patient can walk but states is painful.  No loss of bowel or bladder control.  No concern for cauda equina.  No fever, night sweats, weight loss, h/o cancer, IVDU.  RICE protocol and pain medicine indicated and discussed with patient.   I personally performed the services described in this documentation, which was scribed in my presence. The recorded information has been reviewed and is accurate.    Elwyn Lade, PA-C 01/18/14 2218

## 2014-01-18 NOTE — ED Notes (Signed)
Patient was educated not to drive, operate heavy machinery, or drink alcohol while taking narcotic medication.  

## 2014-01-18 NOTE — ED Notes (Signed)
Pt states that she started having right lower back pain that radiates down her right hip and leg.  Pt states that it started on Saturday and has progressively gotten worse.  j

## 2014-01-19 NOTE — ED Provider Notes (Signed)
Medical screening examination/treatment/procedure(s) were performed by non-physician practitioner and as supervising physician I was immediately available for consultation/collaboration.   Houston Siren III, MD 01/19/14 [phone]

## 2014-08-19 ENCOUNTER — Emergency Department (HOSPITAL_COMMUNITY): Payer: Self-pay

## 2014-08-19 ENCOUNTER — Encounter (HOSPITAL_COMMUNITY): Payer: Self-pay | Admitting: Emergency Medicine

## 2014-08-19 ENCOUNTER — Observation Stay (HOSPITAL_COMMUNITY): Payer: Self-pay

## 2014-08-19 ENCOUNTER — Observation Stay (HOSPITAL_COMMUNITY)
Admission: EM | Admit: 2014-08-19 | Discharge: 2014-08-21 | Disposition: A | Discharge status: Home / Self Care | Payer: Self-pay | Attending: Internal Medicine | Admitting: Internal Medicine

## 2014-08-19 DIAGNOSIS — G459 Transient cerebral ischemic attack, unspecified: Secondary | ICD-10-CM | POA: Diagnosis present

## 2014-08-19 DIAGNOSIS — F32A Depression, unspecified: Secondary | ICD-10-CM | POA: Diagnosis present

## 2014-08-19 DIAGNOSIS — R079 Chest pain, unspecified: Secondary | ICD-10-CM | POA: Insufficient documentation

## 2014-08-19 DIAGNOSIS — F329 Major depressive disorder, single episode, unspecified: Secondary | ICD-10-CM

## 2014-08-19 DIAGNOSIS — Z79891 Long term (current) use of opiate analgesic: Secondary | ICD-10-CM | POA: Insufficient documentation

## 2014-08-19 DIAGNOSIS — I671 Cerebral aneurysm, nonruptured: Secondary | ICD-10-CM | POA: Insufficient documentation

## 2014-08-19 DIAGNOSIS — Z791 Long term (current) use of non-steroidal anti-inflammatories (NSAID): Secondary | ICD-10-CM | POA: Insufficient documentation

## 2014-08-19 DIAGNOSIS — Z87891 Personal history of nicotine dependence: Secondary | ICD-10-CM | POA: Insufficient documentation

## 2014-08-19 DIAGNOSIS — F419 Anxiety disorder, unspecified: Secondary | ICD-10-CM | POA: Insufficient documentation

## 2014-08-19 DIAGNOSIS — Z79899 Other long term (current) drug therapy: Secondary | ICD-10-CM | POA: Insufficient documentation

## 2014-08-19 DIAGNOSIS — R0789 Other chest pain: Secondary | ICD-10-CM | POA: Insufficient documentation

## 2014-08-19 DIAGNOSIS — R200 Anesthesia of skin: Secondary | ICD-10-CM | POA: Insufficient documentation

## 2014-08-19 DIAGNOSIS — E785 Hyperlipidemia, unspecified: Secondary | ICD-10-CM | POA: Insufficient documentation

## 2014-08-19 DIAGNOSIS — I10 Essential (primary) hypertension: Principal | ICD-10-CM | POA: Diagnosis present

## 2014-08-19 DIAGNOSIS — Z636 Dependent relative needing care at home: Secondary | ICD-10-CM | POA: Insufficient documentation

## 2014-08-19 DIAGNOSIS — I160 Hypertensive urgency: Secondary | ICD-10-CM

## 2014-08-19 LAB — DIFFERENTIAL
Basophils Absolute: 0.1 10*3/uL (ref 0.0–0.1)
Basophils Relative: 0 % (ref 0–1)
Eosinophils Absolute: 0.1 10*3/uL (ref 0.0–0.7)
Eosinophils Relative: 1 % (ref 0–5)
Lymphocytes Relative: 26 % (ref 12–46)
Lymphs Abs: 3.3 10*3/uL (ref 0.7–4.0)
Monocytes Absolute: 0.8 10*3/uL (ref 0.1–1.0)
Monocytes Relative: 6 % (ref 3–12)
Neutro Abs: 8.3 10*3/uL — ABNORMAL HIGH (ref 1.7–7.7)
Neutrophils Relative %: 67 % (ref 43–77)

## 2014-08-19 LAB — CBC
HCT: 41.3 % (ref 36.0–46.0)
HCT: 39.8 % (ref 36.0–46.0)
Hemoglobin: 13.4 g/dL (ref 12.0–15.0)
Hemoglobin: 13.2 g/dL (ref 12.0–15.0)
MCH: 27.9 pg (ref 26.0–34.0)
MCH: 28.4 pg (ref 26.0–34.0)
MCHC: 32.4 g/dL (ref 30.0–36.0)
MCHC: 33.2 g/dL (ref 30.0–36.0)
MCV: 85.9 fL (ref 78.0–100.0)
MCV: 85.6 fL (ref 78.0–100.0)
Platelets: 286 10*3/uL (ref 150–400)
Platelets: 274 10*3/uL (ref 150–400)
RBC: 4.81 MIL/uL (ref 3.87–5.11)
RBC: 4.65 MIL/uL (ref 3.87–5.11)
RDW: 13.2 % (ref 11.5–15.5)
RDW: 13.4 % (ref 11.5–15.5)
WBC: 12.6 10*3/uL — ABNORMAL HIGH (ref 4.0–10.5)
WBC: 17.4 10*3/uL — ABNORMAL HIGH (ref 4.0–10.5)

## 2014-08-19 LAB — RAPID URINE DRUG SCREEN, HOSP PERFORMED
Amphetamines: NOT DETECTED
Amphetamines: NOT DETECTED
Barbiturates: NOT DETECTED
Barbiturates: NOT DETECTED
Benzodiazepines: NOT DETECTED
Benzodiazepines: NOT DETECTED
Cocaine: NOT DETECTED
Cocaine: NOT DETECTED
Opiates: NOT DETECTED
Opiates: NOT DETECTED
Tetrahydrocannabinol: POSITIVE — AB
Tetrahydrocannabinol: POSITIVE — AB

## 2014-08-19 LAB — COMPREHENSIVE METABOLIC PANEL
ALT: 16 U/L (ref 0–35)
AST: 20 U/L (ref 0–37)
Albumin: 4.6 g/dL (ref 3.5–5.2)
Alkaline Phosphatase: 76 U/L (ref 39–117)
Anion gap: 8 (ref 5–15)
BUN: 14 mg/dL (ref 6–23)
CO2: 26 mmol/L (ref 19–32)
Calcium: 9.7 mg/dL (ref 8.4–10.5)
Chloride: 108 mmol/L (ref 96–112)
Creatinine, Ser: 0.61 mg/dL (ref 0.50–1.10)
GFR calc Af Amer: >90 mL/min (ref >90)
GFR calc non Af Amer: >90 mL/min (ref >90)
Glucose, Bld: 92 mg/dL (ref 70–99)
Potassium: 3.9 mmol/L (ref 3.5–5.1)
Sodium: 142 mmol/L (ref 135–145)
Total Bilirubin: 0.9 mg/dL (ref 0.3–1.2)
Total Protein: 8 g/dL (ref 6.0–8.3)

## 2014-08-19 LAB — I-STAT CHEM 8, ED
BUN: 15 mg/dL (ref 6–23)
Calcium, Ion: 1.14 mmol/L (ref 1.12–1.23)
Chloride: 104 mmol/L (ref 96–112)
Creatinine, Ser: 0.6 mg/dL (ref 0.50–1.10)
Glucose, Bld: 90 mg/dL (ref 70–99)
HCT: 45 % (ref 36.0–46.0)
Hemoglobin: 15.3 g/dL — ABNORMAL HIGH (ref 12.0–15.0)
Potassium: 4 mmol/L (ref 3.5–5.1)
Sodium: 142 mmol/L (ref 135–145)
TCO2: 25 mmol/L (ref 0–100)

## 2014-08-19 LAB — URINALYSIS, ROUTINE W REFLEX MICROSCOPIC
Bilirubin Urine: NEGATIVE
Glucose, UA: NEGATIVE mg/dL
Hgb urine dipstick: NEGATIVE
Ketones, ur: NEGATIVE mg/dL
Leukocytes, UA: NEGATIVE
Nitrite: NEGATIVE
Protein, ur: NEGATIVE mg/dL
Specific Gravity, Urine: 1.021 (ref 1.005–1.030)
Urobilinogen, UA: 0.2 mg/dL (ref 0.0–1.0)
pH: 6.5 (ref 5.0–8.0)

## 2014-08-19 LAB — CREATININE, SERUM
Creatinine, Ser: 0.64 mg/dL (ref 0.50–1.10)
GFR calc Af Amer: >90 mL/min (ref >90)
GFR calc non Af Amer: >90 mL/min (ref >90)

## 2014-08-19 LAB — APTT: aPTT: 29 seconds (ref 24–37)

## 2014-08-19 LAB — TROPONIN I: Troponin I: <0.03 ng/mL (ref <0.031)

## 2014-08-19 LAB — I-STAT TROPONIN, ED: Troponin i, poc: 0.01 ng/mL (ref 0.00–0.08)

## 2014-08-19 LAB — PROTIME-INR
INR: 0.98 (ref 0.00–1.49)
Prothrombin Time: 13.1 seconds (ref 11.6–15.2)

## 2014-08-19 LAB — ETHANOL: Alcohol, Ethyl (B): <5 mg/dL (ref 0–9)

## 2014-08-19 MED ORDER — ASPIRIN 325 MG PO TABS
325.0000 mg | ORAL_TABLET | Freq: Every day | ORAL | Status: DC
  Administered 2014-08-19 – 2014-08-21 (×3): 325 mg via ORAL
  Filled 2014-08-19 (×3): qty 1

## 2014-08-19 MED ORDER — LISINOPRIL 20 MG PO TABS
20.0000 mg | ORAL_TABLET | Freq: Every day | ORAL | Status: DC
  Administered 2014-08-19 – 2014-08-20 (×2): 20 mg via ORAL
  Filled 2014-08-19 (×2): qty 1

## 2014-08-19 MED ORDER — HYDRALAZINE HCL 20 MG/ML IJ SOLN
10.0000 mg | INTRAMUSCULAR | Status: DC | PRN
  Administered 2014-08-19: 10 mg via INTRAVENOUS
  Filled 2014-08-19: qty 1

## 2014-08-19 MED ORDER — LABETALOL HCL 5 MG/ML IV SOLN
20.0000 mg | Freq: Once | INTRAVENOUS | Status: AC
  Administered 2014-08-19: 20 mg via INTRAVENOUS
  Filled 2014-08-19: qty 4

## 2014-08-19 MED ORDER — ACETAMINOPHEN 325 MG PO TABS
650.0000 mg | ORAL_TABLET | Freq: Four times a day (QID) | ORAL | Status: DC | PRN
  Administered 2014-08-19 – 2014-08-21 (×3): 650 mg via ORAL
  Filled 2014-08-19 (×3): qty 2

## 2014-08-19 MED ORDER — ENOXAPARIN SODIUM 40 MG/0.4ML ~~LOC~~ SOLN
40.0000 mg | SUBCUTANEOUS | Status: DC
  Administered 2014-08-19 – 2014-08-20 (×2): 40 mg via SUBCUTANEOUS
  Filled 2014-08-19 (×2): qty 0.4

## 2014-08-19 MED ORDER — SODIUM CHLORIDE 0.9 % IJ SOLN: 3.0000 mL | INTRAMUSCULAR | Status: DC | PRN

## 2014-08-19 MED ORDER — SODIUM CHLORIDE 0.9 % IJ SOLN
3.0000 mL | Freq: Two times a day (BID) | INTRAMUSCULAR | Status: DC
  Administered 2014-08-19 – 2014-08-20 (×3): 3 mL via INTRAVENOUS

## 2014-08-19 MED ORDER — PROMETHAZINE HCL 25 MG/ML IJ SOLN
12.5000 mg | Freq: Four times a day (QID) | INTRAMUSCULAR | Status: DC | PRN
  Administered 2014-08-19: 12.5 mg via INTRAVENOUS
  Filled 2014-08-19: qty 1

## 2014-08-19 MED ORDER — HYDRALAZINE HCL 20 MG/ML IJ SOLN
10.0000 mg | Freq: Four times a day (QID) | INTRAMUSCULAR | Status: DC | PRN
  Administered 2014-08-19 (×2): 10 mg via INTRAVENOUS
  Filled 2014-08-19 (×3): qty 1

## 2014-08-19 MED ORDER — SODIUM CHLORIDE 0.9 % IV SOLN: 250.0000 mL | INTRAVENOUS | Status: DC | PRN

## 2014-08-19 MED ORDER — ONDANSETRON HCL 4 MG/2ML IJ SOLN
4.0000 mg | Freq: Four times a day (QID) | INTRAMUSCULAR | Status: DC | PRN
  Administered 2014-08-19 – 2014-08-20 (×2): 4 mg via INTRAVENOUS
  Filled 2014-08-19 (×2): qty 2

## 2014-08-19 MED ORDER — LISINOPRIL-HYDROCHLOROTHIAZIDE 10-12.5 MG PO TABS: 2.0000 | ORAL_TABLET | Freq: Every day | ORAL | Status: DC

## 2014-08-19 MED ORDER — HYDROCHLOROTHIAZIDE 25 MG PO TABS
25.0000 mg | ORAL_TABLET | Freq: Every day | ORAL | Status: DC
  Administered 2014-08-19 – 2014-08-20 (×2): 25 mg via ORAL
  Filled 2014-08-19 (×2): qty 1

## 2014-08-19 MED ORDER — STROKE: EARLY STAGES OF RECOVERY BOOK
Freq: Once | Status: AC
  Administered 2014-08-19: 20:00:00
  Filled 2014-08-19: qty 1

## 2014-08-19 NOTE — H&P (Signed)
[facility] History and Physical  [name]:[id number] DOB: 1956/03/12 DOA: 08/19/2014  Referring physician:  PCP: No PCP Per Patient  Specialists:   Chief Complaint: Left face and hand numbness  HPI: [name] is a 59 y.o. female  With a history of hypertension that presented to the emergency department with complaints of left-sided hand and facial numbness. Patient states that this started earlier today however upon coming to the emergency department it subsided. Patient also complained of some chest pressure which is midsternal. She stated occurred at approximately 4:00 this morning. Patient denied any associated symptoms or radiation of symptoms. Pain subsided on its own without any intervention. Patient does admit to being very stressed lately as she is taking care of of her elderly demented mother as well as a disabled child. Patient also admits to not having medications for the last several months. She was speaking Prinzide twice daily however ran out and has not had a primary care physician. She also complained of headache which started approximately 2 days ago and comes and goes. Patient cannot remember the type of headache that she had. Patient denies any visual changes problems with speaking, weakness, problems walking. In the ER, CT head was done which was negative for acute abnormalities. [facility] was called for admission.  Review of Systems:  Constitutional: Denies fever, chills, diaphoresis, appetite change and fatigue.  HEENT: Denies photophobia, eye pain, redness, hearing loss, ear pain, congestion, sore throat, rhinorrhea, sneezing, mouth sores, trouble swallowing, neck pain, neck stiffness and tinnitus.   Respiratory: Denies SOB, DOE, cough, chest tightness,  and wheezing.   Cardiovascular: Complaint of midsternal chest pain. Gastrointestinal: Denies nausea, vomiting, abdominal pain, diarrhea, constipation, blood in stool and abdominal distention.    Genitourinary: Denies dysuria, urgency, frequency, hematuria, flank pain and difficulty urinating.  Musculoskeletal: Denies myalgias, back pain, joint swelling, arthralgias and gait problem.  Skin: Denies pallor, rash and wound.  Neurological: Complaint of left face and hand numbness earlier today..  Hematological: Denies adenopathy. Easy bruising, personal or family bleeding history  Psychiatric/Behavioral: Denies suicidal ideation, mood changes, confusion, nervousness, sleep disturbance and agitation  Past Medical History  Diagnosis Date  . Hypertension    Past Surgical History  Procedure Laterality Date  . Cesarean section    . Cholecystectomy     Social History:  reports that she has quit smoking. She does not have any smokeless tobacco history on file. She reports that she drinks alcohol. She reports that she does not use illicit drugs.   No Known Allergies  Family History  Problem Relation Age of Onset  . Hypertension Mother   . Heart disease Father     Prior to Admission medications   Medication Sig Start Date End Date Taking? Authorizing Provider  acetaminophen (TYLENOL) 500 MG tablet Take 1,000 mg by mouth every 6 (six) hours as needed for mild pain or headache.   Yes Historical Provider, MD  diazepam (VALIUM) 5 MG tablet Take 1 tablet (5 mg total) by mouth 2 (two) times daily. Patient not taking: Reported on 08/19/2014 01/18/14   Elwyn Lade, PA-C  HYDROcodone-acetaminophen (NORCO/VICODIN) 5-325 MG per tablet Take 1 tablet by mouth every 6 (six) hours as needed for severe pain. Patient not taking: Reported on 08/19/2014 01/18/14   Elwyn Lade, PA-C  lisinopril-hydrochlorothiazide (PRINZIDE) 10-12.5 MG per tablet Take 2 tablets by mouth at bedtime. Patient not taking: Reported on 08/19/2014 12/27/13   Pamella Pert, MD   Physical Exam: Filed Vitals:  08/19/14 1716  BP: 201/102  Pulse: 82  Temp:   Resp: 17     General: Well developed, well nourished,  NAD, appears stated age  HEENT: NCAT, PERRLA, EOMI, Anicteic Sclera, mucous membranes moist.   Neck: Supple, no JVD, no masses  Cardiovascular: S1 S2 auscultated, no rubs, murmurs or gallops. Regular rate and rhythm.  Respiratory: Clear to auscultation bilaterally with equal chest rise  Abdomen: Soft, nontender, nondistended, + bowel sounds  Extremities: warm dry without cyanosis clubbing or edema  Neuro: AAOx3, cranial nerves grossly intact. Strength 5/5 in patient's upper and lower extremities bilaterally  Skin: Without rashes exudates or nodules  Psych: Anxious but appropriate affect  Labs on Admission:  Basic Metabolic Panel:  Recent Labs Lab 08/19/14 1603 08/19/14 1631  NA 142 142  K 3.9 4.0  CL 108 104  CO2 26  --   GLUCOSE 92 90  BUN 14 15  CREATININE 0.61 0.60  CALCIUM 9.7  --    Liver Function Tests:  Recent Labs Lab 08/19/14 1603  AST 20  ALT 16  ALKPHOS 76  BILITOT 0.9  PROT 8.0  ALBUMIN 4.6   No results for input(s): LIPASE, AMYLASE in the last 168 hours. No results for input(s): AMMONIA in the last 168 hours. CBC:  Recent Labs Lab 08/19/14 1603 08/19/14 1631  WBC 12.6*  --   NEUTROABS 8.3*  --   HGB 13.4 15.3*  HCT 41.3 45.0  MCV 85.9  --   PLT 286  --    Cardiac Enzymes: No results for input(s): CKTOTAL, CKMB, CKMBINDEX, TROPONINI in the last 168 hours.  BNP (last 3 results) No results for input(s): BNP in the last 8760 hours.  ProBNP (last 3 results) No results for input(s): PROBNP in the last 8760 hours.  CBG: No results for input(s): GLUCAP in the last 168 hours.  Radiological Exams on Admission: Dg Chest 2 View  08/19/2014   CLINICAL DATA:  Chest pain since last night  EXAM: CHEST  2 VIEW  COMPARISON:  12/27/2013  FINDINGS: Cardiac shadow is stable. The lungs are well aerated bilaterally. No focal infiltrate or sizable effusion is seen. Nipple shadow is again noted overlying the left lung base. Postsurgical changes are  noted in the low cervical spine .  IMPRESSION: No active cardiopulmonary disease.   Electronically Signed   By: Inez Catalina M.D.   On: 08/19/2014 16:45   Ct Head Wo Contrast  08/19/2014   CLINICAL DATA:  Hypertension, headache, facial numbness  EXAM: CT HEAD WITHOUT CONTRAST  TECHNIQUE: Contiguous axial images were obtained from the base of the skull through the vertex without intravenous contrast.  COMPARISON:  12/27/2013  FINDINGS: No evidence of parenchymal hemorrhage or extra-axial fluid collection. No mass lesion, mass effect, or midline shift.  No CT evidence of acute infarction.  Mild small vessel ischemic changes.  Cerebral volume is within normal limits.  No ventriculomegaly.  The visualized paranasal sinuses are essentially clear. The mastoid air cells are unopacified.  No evidence of calvarial fracture.  IMPRESSION: No evidence of acute intracranial abnormality.  Mild small vessel ischemic changes.   Electronically Signed   By: Julian Hy M.D.   On: 08/19/2014 16:48    EKG: Independently reviewed. Sinus rhythm, rate 84, LVH  Assessment/Plan Active Problems:   TIA (transient ischemic attack)   Left sided hand and facial numbness/Suspect TIA -Patient will be admitted to telemetry. -Patient's numbness has subsided -CT of the head: No evidence of acute  intracranial abnormality -Possibly multifactorial including stress, anxiety, uncontrolled hypertension -Will obtain MRI of the brain, pending those results will obtain echocardiogram and carotid Doppler -Will conduct neuro checks every 4 hours -Will check lipid panel, hemoglobin A1c -Patient be placed on aspirin therapy  Accelerated hypertension -Will continue patient's home medication of Prinzide -Hydralazine IV PRN for systolic blood pressures over 832 or diastolic over 919 -Patient is running out of her medication -Will speak to case management regarding primary care physician  Chest pressure -Patient complained of chest  pressure which was approximately 5 minutes -Currently chest pain-free -EKG shows no changes -Likely secondary to accelerated hypertension -Will continue to monitor troponins -Obtain fasting lipid panel, TSH, magnesium, phosphate -Will place patient on aspirin therapy  Stress and anxiety -Patient used to be on Valium however has run out -Spoke to patient regarding stress relieving  DVT prophylaxis: Lovenox  Code Status: Full  Condition: Guarded  Family Communication: None at bedside. Admission, patients condition and plan of care including tests being ordered have been discussed with the patient, who indicates understanding and agrees with the plan and Code Status.  Disposition Plan: Admitted for observation  Time spent: 60 minutes  Kiriana Worthington D.O. [facility] Pager [phone]  If 7PM-7AM, please contact night-coverage [email or web address] Password [facility] 08/19/2014, 6:15 PM

## 2014-08-19 NOTE — ED Notes (Signed)
Pt reports some tingling in the left arm, none in the left leg. Reports some headache but no dizziness.

## 2014-08-19 NOTE — ED Notes (Signed)
Patient transported to MRI 

## 2014-08-19 NOTE — ED Notes (Signed)
Mendel Ryder spoke to charge nurse @ 18:23 pm to notify of arrival.

## 2014-08-19 NOTE — ED Notes (Addendum)
Pt reports chest "aches" that have since gone away around 0400 this morning. Has felt nauseous the last few days. Woke up this morning and says, "my face feels numb all over and funny." Denies trouble speaking or extremity numbness. Denies dizziness but does have an intermittent headache and some blurry vision. Has taken HCTZ in the past but has "been off my blood pressure medicine for some months because I don't have a PCP. I usually use the Urgent Care or the ER." Reports recent increase in home stress and says it could be some anxiety. Speaking full/clear sentences. RR even/unlabored. Ambulatory with steady gait. No other c/c.

## 2014-08-19 NOTE — ED Notes (Signed)
Pt went to MRI before going upstairs...klj

## 2014-08-19 NOTE — ED Notes (Signed)
Pt unable to void at this time. 

## 2014-08-19 NOTE — ED Provider Notes (Addendum)
CSN: [id number]     Arrival date & time 08/19/14  1418 History   First MD Initiated Contact with Patient 08/19/14 1543     Chief Complaint  Patient presents with  . Facial Numbness   . Hypertension     (Consider location/radiation/quality/duration/timing/severity/associated sxs/prior Treatment) HPI Comments: Patient states she has a history of hypertension and was on prinzide but because she does not have her primary care physician because she cannot afford health insurance she has not been on the medication for 4-5 months. Over the last 2-3 days she has had intermittent headaches, chest ache, intermittent shortness of breath, nausea and over the last 2 days she's had persistent tingling and numbness in the left side of her face and arm. She says in the past she has had some brief numbness in this area but nothing that's persisted for days at a time. She currently denies any chest pain but states the last episode was around 4 AM this morning. She denies any visual changes, gait difficulty, swallowing difficulty, speech problems, unilateral weakness. Today she has taken Tylenol which seems to have helped her headache but came in because she was unable to get the numbness to go away.  Also because of her nausea over the last few days she has been vomiting and not eaten much. She denies any diarrhea  Patient is a 59 y.o. female presenting with hypertension. The history is provided by the patient.  Hypertension This is a recurrent problem. Episode onset: 4-5 months. The problem occurs constantly. The problem has not changed since onset.Associated symptoms include chest pain, headaches and shortness of breath. Associated symptoms comments: Nausea and numbness to the left face and arm for the last 2-3 days. Nothing aggravates the symptoms. Nothing relieves the symptoms. She has tried acetaminophen and rest for the symptoms. The treatment provided no relief.    Past Medical History  Diagnosis Date  .  Hypertension    Past Surgical History  Procedure Laterality Date  . Cesarean section    . Cholecystectomy     Family History  Problem Relation Age of Onset  . Hypertension Mother   . Heart disease Father    History  Substance Use Topics  . Smoking status: Former Research scientist (life sciences)  . Smokeless tobacco: Not on file  . Alcohol Use: Yes     Comment: ocassionally   OB History    No data available     Review of Systems  Respiratory: Positive for shortness of breath.   Cardiovascular: Positive for chest pain. Negative for leg swelling.  Neurological: Positive for numbness and headaches. Negative for dizziness and weakness.  All other systems reviewed and are negative.     Allergies  Review of patient's allergies indicates no known allergies.  Home Medications   Prior to Admission medications   Medication Sig Start Date End Date Taking? Authorizing Provider  diazepam (VALIUM) 5 MG tablet Take 1 tablet (5 mg total) by mouth 2 (two) times daily. 01/18/14   Elwyn Lade, PA-C  HYDROcodone-acetaminophen (NORCO/VICODIN) 5-325 MG per tablet Take 1 tablet by mouth every 6 (six) hours as needed for severe pain. 01/18/14   Elwyn Lade, PA-C  lisinopril-hydrochlorothiazide (PRINZIDE) 10-12.5 MG per tablet Take 2 tablets by mouth at bedtime. 12/27/13   Pamella Pert, MD  naproxen sodium (ANAPROX) 220 MG tablet Take 440 mg by mouth 2 (two) times daily with a meal.    Historical Provider, MD   BP 186/119 mmHg  Pulse 95  Temp(Src)  97.9 F (36.6 C) (Oral)  Resp 16  SpO2 100% Physical Exam  Constitutional: She is oriented to person, place, and time. She appears well-developed and well-nourished. No distress.  HENT:  Head: Normocephalic and atraumatic.  Right Ear: Tympanic membrane and ear canal normal.  Left Ear: Tympanic membrane and ear canal normal.  Mouth/Throat: Oropharynx is clear and moist.  Eyes: Conjunctivae and EOM are normal. Pupils are equal, round, and reactive to light.   Neck: Normal range of motion. Neck supple.  Cardiovascular: Normal rate, regular rhythm and intact distal pulses.   No murmur heard. Pulmonary/Chest: Effort normal and breath sounds normal. No respiratory distress. She has no wheezes. She has no rales. She exhibits no tenderness.  Abdominal: Soft. She exhibits no distension. There is no tenderness. There is no rebound and no guarding.  Musculoskeletal: Normal range of motion. She exhibits no edema or tenderness.  Neurological: She is alert and oriented to person, place, and time. She has normal strength. A sensory deficit is present. No cranial nerve deficit. Coordination and gait normal.  Visual field cuts, normal finger to nose and heel to shin testing. Slight decreased sensation over the left face and left upper extremity compared with the right side. Lower extremities seem to be the same.  Skin: Skin is warm and dry. No rash noted. No erythema.  Psychiatric: She has a normal mood and affect. Her behavior is normal.  Nursing note and vitals reviewed.   ED Course  Procedures (including critical care time) Labs Review Labs Reviewed  CBC - Abnormal; Notable for the following:    WBC 12.6 (*)    All other components within normal limits  DIFFERENTIAL - Abnormal; Notable for the following:    Neutro Abs 8.3 (*)    All other components within normal limits  URINE RAPID DRUG SCREEN (HOSP PERFORMED) - Abnormal; Notable for the following:    Tetrahydrocannabinol POSITIVE (*)    All other components within normal limits  I-STAT CHEM 8, ED - Abnormal; Notable for the following:    Hemoglobin 15.3 (*)    All other components within normal limits  ETHANOL  PROTIME-INR  APTT  COMPREHENSIVE METABOLIC PANEL  URINALYSIS, ROUTINE W REFLEX MICROSCOPIC  I-STAT TROPOININ, ED    Imaging Review Dg Chest 2 View  08/19/2014   CLINICAL DATA:  Chest pain since last night  EXAM: CHEST  2 VIEW  COMPARISON:  12/27/2013  FINDINGS: Cardiac shadow is  stable. The lungs are well aerated bilaterally. No focal infiltrate or sizable effusion is seen. Nipple shadow is again noted overlying the left lung base. Postsurgical changes are noted in the low cervical spine .  IMPRESSION: No active cardiopulmonary disease.   Electronically Signed   By: Inez Catalina M.D.   On: 08/19/2014 16:45   Ct Head Wo Contrast  08/19/2014   CLINICAL DATA:  Hypertension, headache, facial numbness  EXAM: CT HEAD WITHOUT CONTRAST  TECHNIQUE: Contiguous axial images were obtained from the base of the skull through the vertex without intravenous contrast.  COMPARISON:  12/27/2013  FINDINGS: No evidence of parenchymal hemorrhage or extra-axial fluid collection. No mass lesion, mass effect, or midline shift.  No CT evidence of acute infarction.  Mild small vessel ischemic changes.  Cerebral volume is within normal limits.  No ventriculomegaly.  The visualized paranasal sinuses are essentially clear. The mastoid air cells are unopacified.  No evidence of calvarial fracture.  IMPRESSION: No evidence of acute intracranial abnormality.  Mild small vessel ischemic changes.  Electronically Signed   By: Julian Hy M.D.   On: 08/19/2014 16:48     EKG Interpretation   Date/Time:  Thursday August 19 2014 14:25:35 EST Ventricular Rate:  84 PR Interval:  145 QRS Duration: 86 QT Interval:  363 QTC Calculation: 429 R Axis:   -9 Text Interpretation:  Sinus rhythm Probable LVH with secondary repol abnrm  No significant change since last tracing Reconfirmed by WARD,  DO, KRISTEN  [phone]) on 08/19/2014 2:30:09 PM      MDM   Final diagnoses:  Chest pain  Hypertensive urgency   Patient with history significant for hypertension that has currently been untreated for the last 4-5 months because patient does not have a PCP and has run out of her blood pressure medication. She attempted to get health insurance however cannot afford it based on her income and missed the deadline for  Obama care. In the last 2-3 days patient has now had symptoms of intermittent headache but persistent left-sided facial tingling and numbness as well as numbness in the left upper extremity. She denies any weakness or other strokelike symptoms. She's had intermittent chest pain and nausea as well. Concern for hypertensive urgency versus ACS versus stroke which could be a result of her persistent hypertension. Also could be that patient is a new onset diabetic and since she does not see a care provider she has not had a blood sugar check. She denies any infectious symptoms and denies any urinary symptoms. EKG shows LVH but no other acute findings.  Stroke workup started and chest x-ray and head CT pending. Patient given labetalol IV to improve her blood pressure which was 186/119.  5:38 PM Initial labs wnl.  Head CT and CXR wnl.  After labetalol BP improved at 178/93.  Will admit for hypertensive urgency and possible cva.  Pt still having mild numbness in the left face and hand but improved.   Blanchie Dessert, MD 08/19/14 1739  Blanchie Dessert, MD 08/19/14 [phone]

## 2014-08-20 ENCOUNTER — Inpatient Hospital Stay (HOSPITAL_COMMUNITY): Payer: Self-pay

## 2014-08-20 DIAGNOSIS — I671 Cerebral aneurysm, nonruptured: Secondary | ICD-10-CM | POA: Insufficient documentation

## 2014-08-20 DIAGNOSIS — R7989 Other specified abnormal findings of blood chemistry: Secondary | ICD-10-CM

## 2014-08-20 DIAGNOSIS — R079 Chest pain, unspecified: Secondary | ICD-10-CM

## 2014-08-20 LAB — LIPID PANEL
Cholesterol: 247 mg/dL — ABNORMAL HIGH (ref 0–200)
HDL: 68 mg/dL (ref >39)
LDL Cholesterol: 151 mg/dL — ABNORMAL HIGH (ref 0–99)
Total CHOL/HDL Ratio: 3.6 RATIO
Triglycerides: 141 mg/dL (ref <150)
VLDL: 28 mg/dL (ref 0–40)

## 2014-08-20 LAB — GLUCOSE, CAPILLARY
Glucose-Capillary: 120 mg/dL — ABNORMAL HIGH (ref 70–99)
Glucose-Capillary: 135 mg/dL — ABNORMAL HIGH (ref 70–99)
Glucose-Capillary: 129 mg/dL — ABNORMAL HIGH (ref 70–99)
Glucose-Capillary: 92 mg/dL (ref 70–99)
Glucose-Capillary: 132 mg/dL — ABNORMAL HIGH (ref 70–99)

## 2014-08-20 LAB — MAGNESIUM: Magnesium: 2.1 mg/dL (ref 1.5–2.5)

## 2014-08-20 LAB — TROPONIN I
Troponin I: 0.09 ng/mL — ABNORMAL HIGH (ref <0.031)
Troponin I: 0.21 ng/mL — ABNORMAL HIGH (ref <0.031)
Troponin I: 0.25 ng/mL — ABNORMAL HIGH (ref <0.031)
Troponin I: 0.21 ng/mL — ABNORMAL HIGH (ref <0.031)

## 2014-08-20 LAB — PHOSPHORUS: Phosphorus: 4.9 mg/dL — ABNORMAL HIGH (ref 2.3–4.6)

## 2014-08-20 LAB — TSH: TSH: 0.702 u[IU]/mL (ref 0.350–4.500)

## 2014-08-20 MED ORDER — ATORVASTATIN CALCIUM 10 MG PO TABS
20.0000 mg | ORAL_TABLET | Freq: Every day | ORAL | Status: DC
  Administered 2014-08-20: 20 mg via ORAL
  Filled 2014-08-20: qty 2

## 2014-08-20 NOTE — Progress Notes (Signed)
  Echocardiogram 2D Echocardiogram has been performed.  Diamond Nickel 08/20/2014, 1:34 PM

## 2014-08-20 NOTE — Consult Note (Signed)
Primary Physician: Primary Cardiologist:  New  Asked to see re elevated troponin  HPI: Patient is a 59 yo who present to [facility] on 2/25 with L face and hand numbness.  Also omplained of some chst pressure at 4 AM 2/25.  No SOB  Eased on own over 5 min   Patient had been off BP meds (Prinzide bid) for several months.   BP elevated in ER 201/102  Home meds resumed.  Troponins ordered    Talking to patinet she notes occasional chest pressure when BP is up  Not associated with activity  No SOB  Yesterday was more noticeable  Wetn away on own  No SOB  No radiation. Since being in hosp has not had any.    Patient is very active  No changes           Past Medical History  Diagnosis Date  . Hypertension     Medications Prior to Admission  Medication Sig Dispense Refill  . acetaminophen (TYLENOL) 500 MG tablet Take 1,000 mg by mouth every 6 (six) hours as needed for mild pain or headache.    . diazepam (VALIUM) 5 MG tablet Take 1 tablet (5 mg total) by mouth 2 (two) times daily. (Patient not taking: Reported on 08/19/2014) 10 tablet 0  . HYDROcodone-acetaminophen (NORCO/VICODIN) 5-325 MG per tablet Take 1 tablet by mouth every 6 (six) hours as needed for severe pain. (Patient not taking: Reported on 08/19/2014) 10 tablet 0  . lisinopril-hydrochlorothiazide (PRINZIDE) 10-12.5 MG per tablet Take 2 tablets by mouth at bedtime. (Patient not taking: Reported on 08/19/2014) 30 tablet 1     . aspirin  325 mg Oral Daily  . enoxaparin (LOVENOX) injection  40 mg Subcutaneous Q24H  . lisinopril  20 mg Oral QHS   And  . hydrochlorothiazide  25 mg Oral QHS  . sodium chloride  3 mL Intravenous Q12H    Infusions:    No Known Allergies  History   Social History  . Marital Status: Widowed    Spouse Name: N/A  . Number of Children: N/A  . Years of Education: N/A   Occupational History  . Not on file.   Social History Main Topics  . Smoking status: Former Research scientist (life sciences)  . Smokeless  tobacco: Not on file  . Alcohol Use: Yes     Comment: ocassionally  . Drug Use: No  . Sexual Activity: Not on file   Other Topics Concern  . Not on file   Social History Narrative    Family History  Problem Relation Age of Onset  . Hypertension Mother   . Heart disease Father     REVIEW OF SYSTEMS:  All systems reviewed  Negative to the above problem except as noted above.    PHYSICAL EXAM: Filed Vitals:   08/20/14 0530  BP: 144/77  Pulse: 86  Temp: 99.4 F (37.4 C)  Resp: 16     Intake/Output Summary (Last 24 hours) at 08/20/14 1013 Last data filed at 08/20/14 0900  Gross per 24 hour  Intake    120 ml  Output    700 ml  Net   -580 ml    General:  Well appearing. No respiratory difficulty HEENT: normal Neck: supple. no JVD. Carotids 2+ bilat; no bruits. No lymphadenopathy or thryomegaly appreciated. Cor: PMI nondisplaced. Regular rate & rhythm. No rubs, gallops or murmurs. Lungs: clear Abdomen: soft, nontender, nondistended. No hepatosplenomegaly. No bruits or masses. Good bowel sounds. Extremities:  no cyanosis, clubbing, rash, edema Neuro: alert & oriented x 3, cranial nerves grossly intact. moves all 4 extremities w/o difficulty. Affect pleasant.  ECG:  SR  84 bpm  Sl ST depression, T wave inversion V4-V6  Changes more prominent from previous ECG    Tele:  SR    Results for orders placed or performed during the hospital encounter of 08/19/14 (from the past 24 hour(s))  Ethanol     Status: None   Collection Time: 08/19/14  4:03 PM  Result Value Ref Range   Alcohol, Ethyl (B) <5 0 - 9 mg/dL  Protime-INR     Status: None   Collection Time: 08/19/14  4:03 PM  Result Value Ref Range   Prothrombin Time 13.1 11.6 - 15.2 seconds   INR 0.98 0.00 - 1.49  APTT     Status: None   Collection Time: 08/19/14  4:03 PM  Result Value Ref Range   aPTT 29 24 - 37 seconds  CBC     Status: Abnormal   Collection Time: 08/19/14  4:03 PM  Result Value Ref Range   WBC  12.6 (H) 4.0 - 10.5 K/uL   RBC 4.81 3.87 - 5.11 MIL/uL   Hemoglobin 13.4 12.0 - 15.0 g/dL   HCT 41.3 36.0 - 46.0 %   MCV 85.9 78.0 - 100.0 fL   MCH 27.9 26.0 - 34.0 pg   MCHC 32.4 30.0 - 36.0 g/dL   RDW 13.2 11.5 - 15.5 %   Platelets 286 150 - 400 K/uL  Differential     Status: Abnormal   Collection Time: 08/19/14  4:03 PM  Result Value Ref Range   Neutrophils Relative % 67 43 - 77 %   Neutro Abs 8.3 (H) 1.7 - 7.7 K/uL   Lymphocytes Relative 26 12 - 46 %   Lymphs Abs 3.3 0.7 - 4.0 K/uL   Monocytes Relative 6 3 - 12 %   Monocytes Absolute 0.8 0.1 - 1.0 K/uL   Eosinophils Relative 1 0 - 5 %   Eosinophils Absolute 0.1 0.0 - 0.7 K/uL   Basophils Relative 0 0 - 1 %   Basophils Absolute 0.1 0.0 - 0.1 K/uL  Comprehensive metabolic panel     Status: None   Collection Time: 08/19/14  4:03 PM  Result Value Ref Range   Sodium 142 135 - 145 mmol/L   Potassium 3.9 3.5 - 5.1 mmol/L   Chloride 108 96 - 112 mmol/L   CO2 26 19 - 32 mmol/L   Glucose, Bld 92 70 - 99 mg/dL   BUN 14 6 - 23 mg/dL   Creatinine, Ser 0.61 0.50 - 1.10 mg/dL   Calcium 9.7 8.4 - 10.5 mg/dL   Total Protein 8.0 6.0 - 8.3 g/dL   Albumin 4.6 3.5 - 5.2 g/dL   AST 20 0 - 37 U/L   ALT 16 0 - 35 U/L   Alkaline Phosphatase 76 39 - 117 U/L   Total Bilirubin 0.9 0.3 - 1.2 mg/dL   GFR calc non Af Amer >90 >90 mL/min   GFR calc Af Amer >90 >90 mL/min   Anion gap 8 5 - 15  Urine Drug Screen     Status: Abnormal   Collection Time: 08/19/14  4:10 PM  Result Value Ref Range   Opiates NONE DETECTED NONE DETECTED   Cocaine NONE DETECTED NONE DETECTED   Benzodiazepines NONE DETECTED NONE DETECTED   Amphetamines NONE DETECTED NONE DETECTED   Tetrahydrocannabinol POSITIVE (A) NONE  DETECTED   Barbiturates NONE DETECTED NONE DETECTED  Urinalysis, Routine w reflex microscopic     Status: None   Collection Time: 08/19/14  4:10 PM  Result Value Ref Range   Color, Urine YELLOW YELLOW   APPearance CLEAR CLEAR   Specific Gravity,  Urine 1.021 1.005 - 1.030   pH 6.5 5.0 - 8.0   Glucose, UA NEGATIVE NEGATIVE mg/dL   Hgb urine dipstick NEGATIVE NEGATIVE   Bilirubin Urine NEGATIVE NEGATIVE   Ketones, ur NEGATIVE NEGATIVE mg/dL   Protein, ur NEGATIVE NEGATIVE mg/dL   Urobilinogen, UA 0.2 0.0 - 1.0 mg/dL   Nitrite NEGATIVE NEGATIVE   Leukocytes, UA NEGATIVE NEGATIVE  I-Stat Troponin, ED (not at [facility])     Status: None   Collection Time: 08/19/14  4:28 PM  Result Value Ref Range   Troponin i, poc 0.01 0.00 - 0.08 ng/mL   Comment 3          I-Stat Chem 8, ED     Status: Abnormal   Collection Time: 08/19/14  4:31 PM  Result Value Ref Range   Sodium 142 135 - 145 mmol/L   Potassium 4.0 3.5 - 5.1 mmol/L   Chloride 104 96 - 112 mmol/L   BUN 15 6 - 23 mg/dL   Creatinine, Ser 0.60 0.50 - 1.10 mg/dL   Glucose, Bld 90 70 - 99 mg/dL   Calcium, Ion 1.14 1.12 - 1.23 mmol/L   TCO2 25 0 - 100 mmol/L   Hemoglobin 15.3 (H) 12.0 - 15.0 g/dL   HCT 45.0 36.0 - 46.0 %  Urine rapid drug screen (hosp performed)     Status: Abnormal   Collection Time: 08/19/14  8:46 PM  Result Value Ref Range   Opiates NONE DETECTED NONE DETECTED   Cocaine NONE DETECTED NONE DETECTED   Benzodiazepines NONE DETECTED NONE DETECTED   Amphetamines NONE DETECTED NONE DETECTED   Tetrahydrocannabinol POSITIVE (A) NONE DETECTED   Barbiturates NONE DETECTED NONE DETECTED  CBC     Status: Abnormal   Collection Time: 08/19/14  9:02 PM  Result Value Ref Range   WBC 17.4 (H) 4.0 - 10.5 K/uL   RBC 4.65 3.87 - 5.11 MIL/uL   Hemoglobin 13.2 12.0 - 15.0 g/dL   HCT 39.8 36.0 - 46.0 %   MCV 85.6 78.0 - 100.0 fL   MCH 28.4 26.0 - 34.0 pg   MCHC 33.2 30.0 - 36.0 g/dL   RDW 13.4 11.5 - 15.5 %   Platelets 274 150 - 400 K/uL  Creatinine, serum     Status: None   Collection Time: 08/19/14  9:02 PM  Result Value Ref Range   Creatinine, Ser 0.64 0.50 - 1.10 mg/dL   GFR calc non Af Amer >90 >90 mL/min   GFR calc Af Amer >90 >90 mL/min  Troponin I (q 6hr x 3)      Status: None   Collection Time: 08/19/14  9:02 PM  Result Value Ref Range   Troponin I <0.03 <0.031 ng/mL  Glucose, capillary     Status: Abnormal   Collection Time: 08/20/14  1:51 AM  Result Value Ref Range   Glucose-Capillary 120 (H) 70 - 99 mg/dL   Comment 1 Notify RN    Comment 2 Document in Chart   Troponin I (q 6hr x 3)     Status: Abnormal   Collection Time: 08/20/14  1:57 AM  Result Value Ref Range   Troponin I 0.09 (H) <0.031 ng/mL  Lipid panel  Status: Abnormal   Collection Time: 08/20/14  1:57 AM  Result Value Ref Range   Cholesterol 247 (H) 0 - 200 mg/dL   Triglycerides 141 <150 mg/dL   HDL 68 >39 mg/dL   Total CHOL/HDL Ratio 3.6 RATIO   VLDL 28 0 - 40 mg/dL   LDL Cholesterol 151 (H) 0 - 99 mg/dL  TSH     Status: None   Collection Time: 08/20/14  1:57 AM  Result Value Ref Range   TSH 0.702 0.350 - 4.500 uIU/mL  Magnesium     Status: None   Collection Time: 08/20/14  1:57 AM  Result Value Ref Range   Magnesium 2.1 1.5 - 2.5 mg/dL  Phosphorus     Status: Abnormal   Collection Time: 08/20/14  1:57 AM  Result Value Ref Range   Phosphorus 4.9 (H) 2.3 - 4.6 mg/dL  Glucose, capillary     Status: Abnormal   Collection Time: 08/20/14  7:25 AM  Result Value Ref Range   Glucose-Capillary 135 (H) 70 - 99 mg/dL  Troponin I (q 6hr x 3)     Status: Abnormal   Collection Time: 08/20/14  8:34 AM  Result Value Ref Range   Troponin I 0.21 (H) <0.031 ng/mL   Dg Chest 2 View  08/19/2014   CLINICAL DATA:  Chest pain since last night  EXAM: CHEST  2 VIEW  COMPARISON:  12/27/2013  FINDINGS: Cardiac shadow is stable. The lungs are well aerated bilaterally. No focal infiltrate or sizable effusion is seen. Nipple shadow is again noted overlying the left lung base. Postsurgical changes are noted in the low cervical spine .  IMPRESSION: No active cardiopulmonary disease.   Electronically Signed   By: Inez Catalina M.D.   On: 08/19/2014 16:45   Ct Head Wo Contrast  08/19/2014    CLINICAL DATA:  Hypertension, headache, facial numbness  EXAM: CT HEAD WITHOUT CONTRAST  TECHNIQUE: Contiguous axial images were obtained from the base of the skull through the vertex without intravenous contrast.  COMPARISON:  12/27/2013  FINDINGS: No evidence of parenchymal hemorrhage or extra-axial fluid collection. No mass lesion, mass effect, or midline shift.  No CT evidence of acute infarction.  Mild small vessel ischemic changes.  Cerebral volume is within normal limits.  No ventriculomegaly.  The visualized paranasal sinuses are essentially clear. The mastoid air cells are unopacified.  No evidence of calvarial fracture.  IMPRESSION: No evidence of acute intracranial abnormality.  Mild small vessel ischemic changes.   Electronically Signed   By: Julian Hy M.D.   On: 08/19/2014 16:48   Mr Jodene Nam Head Wo Contrast  08/20/2014   CLINICAL DATA:  Left-sided facial numbness. Headaches and blurred vision.  EXAM: MRA HEAD WITHOUT CONTRAST  TECHNIQUE: Angiographic images of the Circle of Willis were obtained using MRA technique without intravenous contrast.  COMPARISON:  None.  FINDINGS: The visualized distal vertebral arteries are patent with the left being slightly larger than the right and with very mild irregularity bilaterally but no stenosis. PICA, AICA, and SCA origins are patient. Basilar artery is patent without stenosis. PCAs are patent without stenosis. There is a small, patent right posterior communicating artery.  Internal carotid arteries are patent from skullbase to carotid termini without stenosis. There is an approximately 4 x 4 mm inferomedially projecting aneurysm arising from the proximal left supraclinoid ICA. Anterior communicating artery is patent. ACAs and MCAs are unremarkable.  IMPRESSION: 1. No major intracranial arterial occlusion or significant stenosis. 2. 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   Electronically Signed   By: Logan Bores   On: 08/20/2014 07:22   Mr Brain  Wo Contrast  08/19/2014   CLINICAL DATA:  59 year old female with left face numbness. Hypertension. Intermittent headache and blurred vision. Initial encounter.  EXAM: MRI HEAD WITHOUT CONTRAST  TECHNIQUE: Multiplanar, multiecho pulse sequences of the brain and surrounding structures were obtained without intravenous contrast.  COMPARISON:  Head CT without contrast 1634 hr today and earlier.  FINDINGS: Partially empty sella. Cerebral volume is normal. No restricted diffusion to suggest acute infarction. No midline shift, mass effect, evidence of mass lesion, ventriculomegaly, extra-axial collection or acute intracranial hemorrhage. Cervicomedullary junction and pituitary are within normal limits. Negative visualized cervical spine. Major intracranial vascular flow voids are within normal limits.  Patchy bilateral periventricular white matter T2 and FLAIR hyperintensity. No cortical encephalomalacia. No chronic blood products identified in the brain. Deep gray matter nuclei, brainstem and cerebellum within normal limits.  Small volume retained secretions in the nasopharynx. Negative paranasal sinuses and mastoids. Visualized orbit soft tissues are within normal limits. Visualized scalp soft tissues are within normal limits. Normal bone marrow signal.  IMPRESSION: 1.  No acute intracranial abnormality. 2. Moderate for age cerebral nonspecific cerebral white matter signal changes. Differential considerations would include accelerated small vessel ischemia, sequelae of trauma, hypercoagulable state, vasculitis, migraines, prior infection or demyelination.   Electronically Signed   By: Genevie Ann M.D.   On: 08/19/2014 19:49     ASSESSMENT:  59 yo with history of HTN  Admitted with numbness face, possible TIA Episode of chest tightness yesterday  Resolved   Labs signif for triv elevation of troponin  Most likely elevation represents demand ischemia in setting of HTN   Agree with echo to define LVEF as well as  wall thickness. If LVEF is normal could recomm lexiscan myoview to r/o inducible ischemia  If CP free with walking and with BP control can do as outpatinet If LV function is down, patient should have L heart cath once stabilizes  Agree with resuming BP meds  Will need to be followed   2.  Neuro  Symptoms have resolved.  Continue BP control  3.  HL  LDL was 151  patient has vascular calcifications on CT of abdomen (iliac) in 2007  I would Rx with statin.  Work on diet.  Start lipitor 20 mg    4.  HTN  BP better today on meds  Will need to be followed.

## 2014-08-20 NOTE — Progress Notes (Signed)
Spoke with pt concerning HH, PCP and medications needs. Pt states, she can afford medications on the four dollar list.  Information given to pt for [facility] and [facility].  Pt understands this is a walk in appointment, 08/23/14 at 9:00 AM.

## 2014-08-20 NOTE — Progress Notes (Signed)
UR completed 

## 2014-08-20 NOTE — Progress Notes (Signed)
Report received from previous RN.  Pt sitting up in chair.  Pt denies pain, N/V, no changes with previous assessment. [name] A

## 2014-08-20 NOTE — Progress Notes (Signed)
08/20/14 0815 Called lab to notify them of the troponin level due at 0800. Per lab (trevor) on the way.

## 2014-08-20 NOTE — Progress Notes (Signed)
[facility]                                                                              Patient Demographics  [name], is a 59 y.o. female, DOB - [birth year], ZOX:[id number]  Admit date - 08/19/2014   Admitting Physician Cristal Ford, DO  Outpatient Primary MD for the patient is No PCP Per Patient  LOS - 1   Chief Complaint  Patient presents with  . Facial Numbness   . Hypertension      HPI on 08/19/2014 [name] is a 59 y.o. female with a history of hypertension that presented to the emergency department with complaints of left-sided hand and facial numbness. Patient states that this started earlier today however upon coming to the emergency department it subsided. Patient also complained of some chest pressure which is midsternal. She stated occurred at approximately 4:00 this morning. Patient denied any associated symptoms or radiation of symptoms. Pain subsided on its own without any intervention. Patient does admit to being very stressed lately as she is taking care of of her elderly demented mother as well as a disabled child. Patient also admits to not having medications for the last several months. She was speaking Prinzide twice daily however ran out and has not had a primary care physician. She also complained of headache which started approximately 2 days ago and comes and goes. Patient cannot remember the type of headache that she had. Patient denies any visual changes problems with speaking, weakness, problems walking. In the ER, CT head was done which was negative for acute abnormalities. [facility] was called for admission.  Assessment & Plan   Elevated troponin/Chest pressure -Likely demand ischemia secondary to Accelerated HTN -Currently chest pain free -Troponin 0.21 -Echocardiogram pending -Cardiology consulted and appreciated, further recommendations following echocardiogram; patient started on lipitor -Continue lisinopril and aspirin -TSH 0.702,  Magnesium 2.1  Left sided hand and facial numbness/Suspect TIA -Symptoms have resolved -CT of the head: No evidence of acute intracranial abnormality -Possibly multifactorial including stress, anxiety, uncontrolled hypertension -MRI of the brain: No acute intracranial abnormality -MRA head: 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 Vertell Limber, neurosurgery- who recommended outpatient followup with Dr. Kathyrn Sheriff -no intervention at this time  Accelerated hypertension -Better controlled -Continue lisinopril and HCTZ, PRN hydralazine  Hyperlipidemia -In 2007, patient was noted to have vascular calcifications on CT of the abdomen -Lipid panel: Total cholesterol 247, Travis regimen 41, HDL 68, LDL 151 -Patient started on statin  Stress and anxiety -Patient used to be on Valium however has run out -Spoke to patient regarding stress relieving  Code Status: Full  Family Communication: None at bedside  Disposition Plan: Admitted, pending further evaluation  Time Spent in minutes   30 minutes  Procedures  Echocardiogram  Consults   Cardiology  Neurosurgery, via phone  DVT Prophylaxis  Lovenox  Lab Results  Component Value Date   PLT 274 08/19/2014    Medications  Scheduled Meds: . aspirin  325 mg Oral Daily  . atorvastatin  20 mg Oral  q1800  . enoxaparin (LOVENOX) injection  40 mg Subcutaneous Q24H  . lisinopril  20 mg Oral QHS   And  . hydrochlorothiazide  25 mg Oral QHS  . sodium chloride  3 mL Intravenous Q12H   Continuous Infusions:  PRN Meds:.sodium chloride, acetaminophen, hydrALAZINE, ondansetron, promethazine, sodium chloride  Antibiotics   Anti-infectives    None        Subjective:   Shirline Frees seen and examined today.  Patient stated that she had a headache early this morning and one  episode of vomiting.  She currently complains of only feeling tired as she did not sleep well.  She denies currently chest pain, shortness of breath, abdominal pain, headache, dizziness, vision changes.  She denies any facial or hand numbness.    Objective:   Filed Vitals:   08/19/14 2347 08/20/14 0145 08/20/14 0530 08/20/14 1100  BP: 157/72 130/71 144/77 120/69  Pulse: 119 92 86 95  Temp: 98.4 F (36.9 C) 99.5 F (37.5 C) 99.4 F (37.4 C) 99.2 F (37.3 C)  TempSrc: Oral Oral Oral Oral  Resp: 18 16 16 16   Height:      Weight:      SpO2: 100% 99% 98% 98%    Wt Readings from Last 3 Encounters:  08/19/14 52.481 kg (115 lb 11.2 oz)  08/19/14 53.978 kg (119 lb)  02/24/13 53.978 kg (119 lb)     Intake/Output Summary (Last 24 hours) at 08/20/14 1252 Last data filed at 08/20/14 1156  Gross per 24 hour  Intake    120 ml  Output    900 ml  Net   -780 ml    Exam  General: Well developed, NAD  HEENT: NCAT, mucous membranes moist.   Neck: Supple, no JVD, no masses  Cardiovascular: S1 S2 auscultated RRR, no murmurs  Respiratory: Clear to auscultation bilaterally with equal chest rise  Abdomen: Soft, nontender, nondistended, + bowel sounds  Extremities: warm dry without cyanosis clubbing or edema  Neuro: AAOx3, nonfocal  Psych: Appropriate mood and affect  Data Review   Micro Results No results found for this or any previous visit (from the past 240 hour(s)).  Radiology Reports Dg Chest 2 View  08/19/2014   CLINICAL DATA:  Chest pain since last night  EXAM: CHEST  2 VIEW  COMPARISON:  12/27/2013  FINDINGS: Cardiac shadow is stable. The lungs are well aerated bilaterally. No focal infiltrate or sizable effusion is seen. Nipple shadow is again noted overlying the left lung base. Postsurgical changes are noted in the low cervical spine .  IMPRESSION: No active cardiopulmonary disease.   Electronically Signed   By: Inez Catalina M.D.   On: 08/19/2014 16:45   Ct Head Wo  Contrast  08/19/2014   CLINICAL DATA:  Hypertension, headache, facial numbness  EXAM: CT HEAD WITHOUT CONTRAST  TECHNIQUE: Contiguous axial images were obtained from the base of the skull through the vertex without intravenous contrast.  COMPARISON:  12/27/2013  FINDINGS: No evidence of parenchymal hemorrhage or extra-axial fluid collection. No mass lesion, mass effect, or midline shift.  No CT evidence of acute infarction.  Mild small vessel ischemic changes.  Cerebral volume is within normal limits.  No ventriculomegaly.  The visualized paranasal sinuses are essentially clear. The mastoid air cells are unopacified.  No evidence of calvarial fracture.  IMPRESSION: No evidence of acute intracranial abnormality.  Mild small vessel ischemic changes.   Electronically Signed   By: Hale Hy M.D.   On: 08/19/2014  16:48   Mr Jodene Nam Head Wo Contrast  08/20/2014   CLINICAL DATA:  Left-sided facial numbness. Headaches and blurred vision.  EXAM: MRA HEAD WITHOUT CONTRAST  TECHNIQUE: Angiographic images of the Circle of Willis were obtained using MRA technique without intravenous contrast.  COMPARISON:  None.  FINDINGS: The visualized distal vertebral arteries are patent with the left being slightly larger than the right and with very mild irregularity bilaterally but no stenosis. PICA, AICA, and SCA origins are patient. Basilar artery is patent without stenosis. PCAs are patent without stenosis. There is a small, patent right posterior communicating artery.  Internal carotid arteries are patent from skullbase to carotid termini without stenosis. There is an approximately 4 x 4 mm inferomedially projecting aneurysm arising from the proximal left supraclinoid ICA. Anterior communicating artery is patent. ACAs and MCAs are unremarkable.  IMPRESSION: 1. No major intracranial arterial occlusion or significant stenosis. 2. 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   Electronically Signed   By: Logan Bores   On:  08/20/2014 07:22   Mr Brain Wo Contrast  08/19/2014   CLINICAL DATA:  59 year old female with left face numbness. Hypertension. Intermittent headache and blurred vision. Initial encounter.  EXAM: MRI HEAD WITHOUT CONTRAST  TECHNIQUE: Multiplanar, multiecho pulse sequences of the brain and surrounding structures were obtained without intravenous contrast.  COMPARISON:  Head CT without contrast 1634 hr today and earlier.  FINDINGS: Partially empty sella. Cerebral volume is normal. No restricted diffusion to suggest acute infarction. No midline shift, mass effect, evidence of mass lesion, ventriculomegaly, extra-axial collection or acute intracranial hemorrhage. Cervicomedullary junction and pituitary are within normal limits. Negative visualized cervical spine. Major intracranial vascular flow voids are within normal limits.  Patchy bilateral periventricular white matter T2 and FLAIR hyperintensity. No cortical encephalomalacia. No chronic blood products identified in the brain. Deep gray matter nuclei, brainstem and cerebellum within normal limits.  Small volume retained secretions in the nasopharynx. Negative paranasal sinuses and mastoids. Visualized orbit soft tissues are within normal limits. Visualized scalp soft tissues are within normal limits. Normal bone marrow signal.  IMPRESSION: 1.  No acute intracranial abnormality. 2. Moderate for age cerebral nonspecific cerebral white matter signal changes. Differential considerations would include accelerated small vessel ischemia, sequelae of trauma, hypercoagulable state, vasculitis, migraines, prior infection or demyelination.   Electronically Signed   By: Genevie Ann M.D.   On: 08/19/2014 19:49    CBC  Recent Labs Lab 08/19/14 1603 08/19/14 1631 08/19/14 2102  WBC 12.6*  --  17.4*  HGB 13.4 15.3* 13.2  HCT 41.3 45.0 39.8  PLT 286  --  274  MCV 85.9  --  85.6  MCH 27.9  --  28.4  MCHC 32.4  --  33.2  RDW 13.2  --  13.4  LYMPHSABS 3.3  --   --     MONOABS 0.8  --   --   EOSABS 0.1  --   --   BASOSABS 0.1  --   --     Chemistries   Recent Labs Lab 08/19/14 1603 08/19/14 1631 08/19/14 2102 08/20/14 0157  NA 142 142  --   --   K 3.9 4.0  --   --   CL 108 104  --   --   CO2 26  --   --   --   GLUCOSE 92 90  --   --   BUN 14 15  --   --   CREATININE 0.61 0.60 0.64  --  CALCIUM 9.7  --   --   --   MG  --   --   --  2.1  AST 20  --   --   --   ALT 16  --   --   --   ALKPHOS 76  --   --   --   BILITOT 0.9  --   --   --    ------------------------------------------------------------------------------------------------------------------ estimated creatinine clearance is 62.6 mL/min (by C-G formula based on Cr of 0.64). ------------------------------------------------------------------------------------------------------------------ No results for input(s): HGBA1C in the last 72 hours. ------------------------------------------------------------------------------------------------------------------  Recent Labs  08/20/14 0157  CHOL 247*  HDL 68  LDLCALC 151*  TRIG 141  CHOLHDL 3.6   ------------------------------------------------------------------------------------------------------------------  Recent Labs  08/20/14 0157  TSH 0.702   ------------------------------------------------------------------------------------------------------------------ No results for input(s): VITAMINB12, FOLATE, FERRITIN, TIBC, IRON, RETICCTPCT in the last 72 hours.  Coagulation profile  Recent Labs Lab 08/19/14 1603  INR 0.98    No results for input(s): DDIMER in the last 72 hours.  Cardiac Enzymes  Recent Labs Lab 08/19/14 2102 08/20/14 0157 08/20/14 0834  TROPONINI <0.03 0.09* 0.21*   ------------------------------------------------------------------------------------------------------------------ Invalid input(s): POCBNP    Apryl Brymer D.O. on 08/20/2014 at 12:52 PM  Between 7am to 7pm - Pager -  [phone]  After 7pm go to [email or web address] - password [email or web address]  And look for the night coverage person covering for me after hours  [facility] Office  [phone]

## 2014-08-20 NOTE — Progress Notes (Signed)
Nutrition Brief Note  Patient identified on the Malnutrition Screening Tool (MST) Report  Pt asleep during RD visit. RD spoke with RN. Per RN, pt is eating well. Pt had some N/V d/t high blood pressure yesterday but this has resolved.   Wt Readings from Last 15 Encounters:  08/19/14 115 lb 11.2 oz (52.481 kg)  08/19/14 119 lb (53.978 kg)  02/24/13 119 lb (53.978 kg)  07/28/12 116 lb (52.617 kg)  07/02/11 121 lb 7.6 oz (55.1 kg)  06/07/11 122 lb (55.339 kg)    Body mass index is 20.5 kg/(m^2). Patient meets criteria for normal range based on current BMI.   Current diet order is heart healthy, patient is consuming approximately 100% of meals at this time. Labs and medications reviewed.   No nutrition interventions warranted at this time. If nutrition issues arise, please consult RD.   Clayton Bibles, MS, RD, LDN Pager: [phone] After Hours Pager: [phone]

## 2014-08-21 LAB — GLUCOSE, CAPILLARY
Glucose-Capillary: 106 mg/dL — ABNORMAL HIGH (ref 70–99)
Glucose-Capillary: 88 mg/dL (ref 70–99)

## 2014-08-21 LAB — BASIC METABOLIC PANEL
Anion gap: 9 (ref 5–15)
BUN: 13 mg/dL (ref 6–23)
CO2: 27 mmol/L (ref 19–32)
Calcium: 9.3 mg/dL (ref 8.4–10.5)
Chloride: 102 mmol/L (ref 96–112)
Creatinine, Ser: 0.59 mg/dL (ref 0.50–1.10)
GFR calc Af Amer: >90 mL/min (ref >90)
GFR calc non Af Amer: >90 mL/min (ref >90)
Glucose, Bld: 141 mg/dL — ABNORMAL HIGH (ref 70–99)
Potassium: 3.4 mmol/L — ABNORMAL LOW (ref 3.5–5.1)
Sodium: 138 mmol/L (ref 135–145)

## 2014-08-21 LAB — HEMOGLOBIN A1C
Hgb A1c MFr Bld: 6.2 % — ABNORMAL HIGH (ref 4.8–5.6)
Mean Plasma Glucose: 131 mg/dL

## 2014-08-21 LAB — CBC
HCT: 38.5 % (ref 36.0–46.0)
Hemoglobin: 12.4 g/dL (ref 12.0–15.0)
MCH: 27.7 pg (ref 26.0–34.0)
MCHC: 32.2 g/dL (ref 30.0–36.0)
MCV: 86.1 fL (ref 78.0–100.0)
Platelets: 282 10*3/uL (ref 150–400)
RBC: 4.47 MIL/uL (ref 3.87–5.11)
RDW: 13.5 % (ref 11.5–15.5)
WBC: 18.1 10*3/uL — ABNORMAL HIGH (ref 4.0–10.5)

## 2014-08-21 LAB — TROPONIN I: Troponin I: 0.17 ng/mL — ABNORMAL HIGH (ref <0.031)

## 2014-08-21 MED ORDER — LISINOPRIL-HYDROCHLOROTHIAZIDE 10-12.5 MG PO TABS: 2.0000 | ORAL_TABLET | Freq: Every day | ORAL | Status: DC

## 2014-08-21 MED ORDER — ATORVASTATIN CALCIUM 20 MG PO TABS: 20.0000 mg | ORAL_TABLET | Freq: Every day | ORAL | Status: DC

## 2014-08-21 MED ORDER — POTASSIUM CHLORIDE CRYS ER 20 MEQ PO TBCR
40.0000 meq | EXTENDED_RELEASE_TABLET | Freq: Once | ORAL | Status: AC
  Administered 2014-08-21: 40 meq via ORAL
  Filled 2014-08-21: qty 2

## 2014-08-21 MED ORDER — ASPIRIN EC 81 MG PO TBEC: 81.0000 mg | DELAYED_RELEASE_TABLET | Freq: Every day | ORAL | Status: DC

## 2014-08-21 NOTE — Discharge Summary (Signed)
Physician Discharge Summary  [name] KNL:[id number] DOB: [birth year] DOA: 08/19/2014  PCP: No PCP Per Patient  Admit date: 08/19/2014 Discharge date: 08/21/2014  Time spent: 35 minutes  Recommendations for Outpatient Follow-up:  Patient will be discharged home. She will need to establish care and follow-up with [facility] and wellness by calling them on Monday morning for an appointment. Patient will also need follow-up with Dr. Dorris Carnes, cardiologist, this appointment will be arranged. Patient should also follow-up with Dr. Kathyrn Sheriff, neurosurgeon, regarding aneurysm.  Patient to continue taking her medications as prescribed. Patient should resume a heart healthy diet. Patient may resume activity as tolerated.  Discharge Diagnoses:  Elevated troponin/chest pressure/demand ischemia Left hand/facial numbness Aneurysm on MRI Accelerated hypertension Hyperlipidemia Stress and anxiety  Discharge Condition: Stable  Diet recommendation: Heart healthy  Filed Weights   08/19/14 2015  Weight: 52.481 kg (115 lb 11.2 oz)    History of present illness:  on 08/19/2014 [name] is a 59 y.o. female with a history of hypertension that presented to the emergency department with complaints of left-sided hand and facial numbness. Patient states that this started earlier today however upon coming to the emergency department it subsided. Patient also complained of some chest pressure which is midsternal. She stated occurred at approximately 4:00 this morning. Patient denied any associated symptoms or radiation of symptoms. Pain subsided on its own without any intervention. Patient does admit to being very stressed lately as she is taking care of of her elderly demented mother as well as a disabled child. Patient also admits to not having medications for the last several months. She was speaking Prinzide twice daily however ran out and has not had a primary care physician. She also  complained of headache which started approximately 2 days ago and comes and goes. Patient cannot remember the type of headache that she had. Patient denies any visual changes problems with speaking, weakness, problems walking. In the ER, CT head was done which was negative for acute abnormalities. [facility] was called for admission.  Hospital Course:  Elevated troponin/Chest pressure -Likely demand ischemia secondary to Accelerated HTN -Currently chest pain free -Peak Troponin 0.25, trending downward -Echocardiogram EF 79-02%, grade 1 diastolic dysfunction -Cardiology consulted and appreciated, further recommendations following echocardiogram; patient started on lipitor; patient will follow-up with Dr. 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 Vertell Limber, neurosurgery- who recommended outpatient followup with Dr. Kathyrn Sheriff -no intervention at this time  Accelerated hypertension -Better controlled -Continue lisinopril and HCTZ  Hyperlipidemia -In 2007, patient was noted to have vascular calcifications on CT of the abdomen -Lipid panel: Total cholesterol 247, Travis regimen 41, HDL 68, LDL 151 -Patient started on statin  Stress and anxiety -Patient used to be on Valium however has run out -Spoke to patient regarding stress relieving  Procedures  Echocardiogram  Consults  Cardiology  Neurosurgery, via phone  Discharge Exam: Filed Vitals:   08/21/14 0813  BP: 156/87  Pulse: 81    Temp: 98.2 F (36.8 C)  Resp:    Exam  General: Well developed, NAD  HEENT: NCAT, mucous membranes moist.   Cardiovascular: S1 S2 auscultated RRR, no murmurs  Respiratory: Clear to auscultation bilaterally with equal chest rise  Extremities: warm dry without cyanosis clubbing or edema  Neuro: AAOx3, nonfocal   Psych: Appropriate mood and affect  Discharge Instructions      Discharge Instructions    Discharge instructions    Complete by:  As directed   Patient will be discharged home. She will need to establish care and follow-up with [facility] and wellness by calling them on Monday morning for an appointment. Patient will also need follow-up with Dr. Dorris Carnes, cardiologist, this appointment will be arranged. Patient to continue taking her medications as prescribed. Patient should resume a heart healthy diet. Patient may resume activity as tolerated.            Medication List    STOP taking these medications        diazepam 5 MG tablet  Commonly known as:  VALIUM     HYDROcodone-acetaminophen 5-325 MG per tablet  Commonly known as:  NORCO/VICODIN      TAKE these medications        acetaminophen 500 MG tablet  Commonly known as:  TYLENOL  Take 1,000 mg by mouth every 6 (six) hours as needed for mild pain or headache.     aspirin EC 81 MG tablet  Take 1 tablet (81 mg total) by mouth daily.     atorvastatin 20 MG tablet  Commonly known as:  LIPITOR  Take 1 tablet (20 mg total) by mouth daily at 6 PM.     lisinopril-hydrochlorothiazide 10-12.5 MG per tablet  Commonly known as:  PRINZIDE  Take 2 tablets by mouth at bedtime.       No Known Allergies Follow-up Information    Follow up with [facility] On 08/23/2014.   Why:  Walk in appointment 08/23/14 at 9:00 AM, please take photo ID, medications, discharge paperwork, $20.00 co-pay.   Contact information:   [address] [place]  [address] [phone]      Follow up with Dorris Carnes, MD. Schedule an appointment as soon as possible for a visit in 1 week.   Specialty:  Cardiology   Why:  Hospital followup   Contact information:   [address] [place] [address] [phone]       Follow up with [name], C, MD. Schedule an appointment as soon as possible for a visit in 2 weeks.   Specialty:  Neurosurgery   Why:  Hospital followup, aneurysm   Contact information:   [address] [place] [address] [phone]        The results of significant diagnostics from this hospitalization (including imaging, microbiology, ancillary and laboratory) are listed below for reference.    Significant Diagnostic Studies: Dg Chest 2 View  08/19/2014   CLINICAL DATA:  Chest pain since last night  EXAM: CHEST  2 VIEW  COMPARISON:  12/27/2013  FINDINGS: Cardiac shadow is stable. The lungs are well aerated bilaterally. No focal infiltrate or sizable effusion is seen. Nipple shadow is again noted overlying the left lung base. Postsurgical changes are noted in the low cervical spine .  IMPRESSION: No active cardiopulmonary disease.   Electronically Signed   By: Inez Catalina M.D.   On: 08/19/2014 16:45   Ct Head Wo Contrast  08/19/2014   CLINICAL DATA:  Hypertension, headache, facial numbness  EXAM:  CT HEAD WITHOUT CONTRAST  TECHNIQUE: Contiguous axial images were obtained from the base of the skull through the vertex without intravenous contrast.  COMPARISON:  12/27/2013  FINDINGS: No evidence of parenchymal hemorrhage or extra-axial fluid collection. No mass lesion, mass effect, or midline shift.  No CT evidence of acute infarction.  Mild small vessel ischemic changes.  Cerebral volume is within normal limits.  No ventriculomegaly.  The visualized paranasal sinuses are essentially clear. The mastoid air cells are unopacified.  No evidence of calvarial fracture.  IMPRESSION: No evidence  of acute intracranial abnormality.  Mild small vessel ischemic changes.   Electronically Signed   By: Julian Hy M.D.   On: 08/19/2014 16:48   Mr Jodene Nam Head Wo Contrast  08/20/2014   CLINICAL DATA:  Left-sided facial numbness. Headaches and blurred vision.  EXAM: MRA HEAD WITHOUT CONTRAST  TECHNIQUE: Angiographic images of the Circle of Willis were obtained using MRA technique without intravenous contrast.  COMPARISON:  None.  FINDINGS: The visualized distal vertebral arteries are patent with the left being slightly larger than the right and with very mild irregularity bilaterally but no stenosis. PICA, AICA, and SCA origins are patient. Basilar artery is patent without stenosis. PCAs are patent without stenosis. There is a small, patent right posterior communicating artery.  Internal carotid arteries are patent from skullbase to carotid termini without stenosis. There is an approximately 4 x 4 mm inferomedially projecting aneurysm arising from the proximal left supraclinoid ICA. Anterior communicating artery is patent. ACAs and MCAs are unremarkable.  IMPRESSION: 1. No major intracranial arterial occlusion or significant stenosis. 2. 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   Electronically Signed   By: Logan Bores   On: 08/20/2014 07:22   Mr Brain Wo Contrast  08/19/2014   CLINICAL DATA:  59 year old female with left face numbness. Hypertension. Intermittent headache and blurred vision. Initial encounter.  EXAM: MRI HEAD WITHOUT CONTRAST  TECHNIQUE: Multiplanar, multiecho pulse sequences of the brain and surrounding structures were obtained without intravenous contrast.  COMPARISON:  Head CT without contrast 1634 hr today and earlier.  FINDINGS: Partially empty sella. Cerebral volume is normal. No restricted diffusion to suggest acute infarction. No midline shift, mass effect, evidence of mass lesion, ventriculomegaly, extra-axial collection or acute intracranial hemorrhage. Cervicomedullary  junction and pituitary are within normal limits. Negative visualized cervical spine. Major intracranial vascular flow voids are within normal limits.  Patchy bilateral periventricular white matter T2 and FLAIR hyperintensity. No cortical encephalomalacia. No chronic blood products identified in the brain. Deep gray matter nuclei, brainstem and cerebellum within normal limits.  Small volume retained secretions in the nasopharynx. Negative paranasal sinuses and mastoids. Visualized orbit soft tissues are within normal limits. Visualized scalp soft tissues are within normal limits. Normal bone marrow signal.  IMPRESSION: 1.  No acute intracranial abnormality. 2. Moderate for age cerebral nonspecific cerebral white matter signal changes. Differential considerations would include accelerated small vessel ischemia, sequelae of trauma, hypercoagulable state, vasculitis, migraines, prior infection or demyelination.   Electronically Signed   By: Genevie Ann M.D.   On: 08/19/2014 19:49    Microbiology: No results found for this or any previous visit (from the past 240 hour(s)).   Labs: Basic Metabolic Panel:  Recent Labs Lab 08/19/14 1603 08/19/14 1631 08/19/14 2102 08/20/14 0157 08/21/14 0326  NA 142 142  --   --  138  K 3.9 4.0  --   --  3.4*  CL 108 104  --   --  102  CO2 26  --   --   --  27  GLUCOSE 92 90  --   --  141*  BUN 14 15  --   --  13  CREATININE 0.61 0.60 0.64  --  0.59  CALCIUM 9.7  --   --   --  9.3  MG  --   --   --  2.1  --   PHOS  --   --   --  4.9*  --    Liver Function Tests:  Recent Labs Lab 08/19/14 1603  AST 20  ALT 16  ALKPHOS 76  BILITOT 0.9  PROT 8.0  ALBUMIN 4.6   No results for input(s): LIPASE, AMYLASE in the last 168 hours. No results for input(s): AMMONIA in the last 168 hours. CBC:  Recent Labs Lab 08/19/14 1603 08/19/14 1631 08/19/14 2102 08/21/14 0326  WBC 12.6*  --  17.4* 18.1*  NEUTROABS 8.3*  --   --   --   HGB 13.4 15.3* 13.2 12.4  HCT  41.3 45.0 39.8 38.5  MCV 85.9  --  85.6 86.1  PLT 286  --  274 282   Cardiac Enzymes:  Recent Labs Lab 08/20/14 0157 08/20/14 0834 08/20/14 1620 08/20/14 2155 08/21/14 0326  TROPONINI 0.09* 0.21* 0.25* 0.21* 0.17*   BNP: BNP (last 3 results) No results for input(s): BNP in the last 8760 hours.  ProBNP (last 3 results) No results for input(s): PROBNP in the last 8760 hours.  CBG:  Recent Labs Lab 08/20/14 0725 08/20/14 1137 08/20/14 1623 08/20/14 2233 08/21/14 0746  GLUCAP 135* 129* 92 132* 106*       Signed:  Brent Taillon  [facility] 08/21/2014, 10:05 AM

## 2014-08-21 NOTE — Progress Notes (Signed)
Subjective: No CP or SOB  Minimal HA   Objective: Filed Vitals:   08/20/14 2015 08/20/14 2358 08/21/14 0421 08/21/14 0730  BP: 148/94 154/83 129/82   Pulse: 100 96 91   Temp: 99.5 F (37.5 C) 99.1 F (37.3 C) 99.8 F (37.7 C) 98.1 F (36.7 C)  TempSrc: Oral Oral Oral Oral  Resp: 18 18 18    Height:      Weight:      SpO2: 100% 100% 96%    Weight change:   Intake/Output Summary (Last 24 hours) at 08/21/14 0739 Last data filed at 08/21/14 0300  Gross per 24 hour  Intake    600 ml  Output    600 ml  Net      0 ml    General: Alert, awake, oriented x3, in no acute distress Neck:  JVP is normal Heart: Regular rate and rhythm, without murmurs, rubs, gallops.  Lungs: Clear to auscultation.  No rales or wheezes. Exemities:  No edema.   Neuro: Grossly intact, nonfocal. Tele:  SR    Lab Results: Results for orders placed or performed during the hospital encounter of 08/19/14 (from the past 24 hour(s))  Troponin I (q 6hr x 3)     Status: Abnormal   Collection Time: 08/20/14  8:34 AM  Result Value Ref Range   Troponin I 0.21 (H) <0.031 ng/mL  Glucose, capillary     Status: Abnormal   Collection Time: 08/20/14 11:37 AM  Result Value Ref Range   Glucose-Capillary 129 (H) 70 - 99 mg/dL  Troponin I (q 6hr x 3)     Status: Abnormal   Collection Time: 08/20/14  4:20 PM  Result Value Ref Range   Troponin I 0.25 (H) <0.031 ng/mL  Glucose, capillary     Status: None   Collection Time: 08/20/14  4:23 PM  Result Value Ref Range   Glucose-Capillary 92 70 - 99 mg/dL  Troponin I (q 6hr x 3)     Status: Abnormal   Collection Time: 08/20/14  9:55 PM  Result Value Ref Range   Troponin I 0.21 (H) <0.031 ng/mL  Glucose, capillary     Status: Abnormal   Collection Time: 08/20/14 10:33 PM  Result Value Ref Range   Glucose-Capillary 132 (H) 70 - 99 mg/dL  Troponin I (q 6hr x 3)     Status: Abnormal   Collection Time: 08/21/14  3:26 AM  Result Value Ref Range   Troponin I 0.17 (H)  <0.031 ng/mL  CBC     Status: Abnormal   Collection Time: 08/21/14  3:26 AM  Result Value Ref Range   WBC 18.1 (H) 4.0 - 10.5 K/uL   RBC 4.47 3.87 - 5.11 MIL/uL   Hemoglobin 12.4 12.0 - 15.0 g/dL   HCT 38.5 36.0 - 46.0 %   MCV 86.1 78.0 - 100.0 fL   MCH 27.7 26.0 - 34.0 pg   MCHC 32.2 30.0 - 36.0 g/dL   RDW 13.5 11.5 - 15.5 %   Platelets 282 150 - 400 K/uL  Basic metabolic panel     Status: Abnormal   Collection Time: 08/21/14  3:26 AM  Result Value Ref Range   Sodium 138 135 - 145 mmol/L   Potassium 3.4 (L) 3.5 - 5.1 mmol/L   Chloride 102 96 - 112 mmol/L   CO2 27 19 - 32 mmol/L   Glucose, Bld 141 (H) 70 - 99 mg/dL   BUN 13 6 - 23 mg/dL  Creatinine, Ser 0.59 0.50 - 1.10 mg/dL   Calcium 9.3 8.4 - 10.5 mg/dL   GFR calc non Af Amer >90 >90 mL/min   GFR calc Af Amer >90 >90 mL/min   Anion gap 9 5 - 15    Studies/Results: Mr Virgel Paling Wo Contrast  08/20/2014   CLINICAL DATA:  Left-sided facial numbness. Headaches and blurred vision.  EXAM: MRA HEAD WITHOUT CONTRAST  TECHNIQUE: Angiographic images of the Circle of Willis were obtained using MRA technique without intravenous contrast.  COMPARISON:  None.  FINDINGS: The visualized distal vertebral arteries are patent with the left being slightly larger than the right and with very mild irregularity bilaterally but no stenosis. PICA, AICA, and SCA origins are patient. Basilar artery is patent without stenosis. PCAs are patent without stenosis. There is a small, patent right posterior communicating artery.  Internal carotid arteries are patent from skullbase to carotid termini without stenosis. There is an approximately 4 x 4 mm inferomedially projecting aneurysm arising from the proximal left supraclinoid ICA. Anterior communicating artery is patent. ACAs and MCAs are unremarkable.  IMPRESSION: 1. No major intracranial arterial occlusion or significant stenosis. 2. 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   Electronically Signed   By:  Logan Bores   On: 08/20/2014 07:22   ECHO 08/20/14  ------------------------------------------------------------------- LV EF: 55% -  60%  ------------------------------------------------------------------- Indications:   Chest pain 786.51.  ------------------------------------------------------------------- History:  PMH: TIA. Risk factors: Hypertension.  ------------------------------------------------------------------- Study Conclusions  - Left ventricle: The cavity size was normal. Wall thickness was increased in a pattern of mild LVH. Systolic function was normal. The estimated ejection fraction was in the range of 55% to 60%. Doppler parameters are consistent with abnormal left ventricular relaxation (grade 1 diastolic dysfunction).  Transthoracic echocardiography. M-mode, complete 2D, spectral Doppler, and color Doppler. Birthdate: Patient birthdate: [birth year]. Age: Patient is 59 yr old. Sex: Gender: female. BMI: 20.3 kg/m^2. Blood pressure:   144/77 Patient status: Inpatient. Study date: Study date: 08/20/2014. Study time: 01:07 PM. Location: Bedside.  -------------------------------------------------------------------  ------------------------------------------------------------------- Left ventricle: The cavity size was normal. Wall thickness was increased in a pattern of mild LVH. Systolic function was normal. The estimated ejection fraction was in the range of 55% to 60%. Doppler parameters are consistent with abnormal left ventricular relaxation (grade 1 diastolic dysfunction).  ------------------------------------------------------------------- Aortic valve:  Structurally normal valve.  Cusp separation was normal. Doppler: Transvalvular velocity was within the normal range. There was no stenosis. There was no regurgitation.  ------------------------------------------------------------------- Mitral valve:  Mildly  thickened leaflets . Leaflet separation was normal. Doppler: Transvalvular velocity was within the normal range. There was no evidence for stenosis. There was trivial regurgitation.  Peak gradient (D): 2 mm Hg.  ------------------------------------------------------------------- Left atrium: The atrium was normal in size.  ------------------------------------------------------------------- Right ventricle: The cavity size was normal. Wall thickness was normal. Systolic function was normal.  ------------------------------------------------------------------- Pulmonic valve:  Structurally normal valve.  Cusp separation was normal. Doppler: Transvalvular velocity was within the normal range. There was no regurgitation.  ------------------------------------------------------------------- Tricuspid valve:  Structurally normal valve.  Leaflet separation was normal. Doppler: Transvalvular velocity was within the normal range. There was trivial regurgitation.  ------------------------------------------------------------------- Right atrium: The atrium was normal in size.  ------------------------------------------------------------------- Pericardium: There was no pericardial effusion.   Medications: Reviewed   @PROBHOSP @  1.  Elevated troponin  Echo with mild LVH and normal LV systolic function I think trivial elevation in trop most likely demand in setting of HTN  Patinet without  CP with walking in halls   I will make sure she has outpatient f/u with me    2.  HTN  Stressed need to keep BP controlled  Keep on meds  She can f/u with me as outpatint.  3  HL  Started lipitor  WIll need f/u  OK to d/c home from cardiac standpoint.  Will be available for questions.    LOS: 2 days   Dorris Carnes 08/21/2014, 7:39 AM

## 2014-08-21 NOTE — Progress Notes (Signed)
Utilization Review completed.  

## 2014-08-21 NOTE — Discharge Instructions (Signed)
Stress and Stress Management °Stress is a normal reaction to life events. It is what you feel when life demands more than you are used to or more than you can handle. Some stress can be useful. For example, the stress reaction can help you catch the last bus of the day, study for a test, or meet a deadline at work. But stress that occurs too often or for too long can cause problems. It can affect your emotional health and interfere with relationships and normal daily activities. Too much stress can weaken your immune system and increase your risk for physical illness. If you already have a medical problem, stress can make it worse. °CAUSES  °All sorts of life events may cause stress. An event that causes stress for one person may not be stressful for another person. Major life events commonly cause stress. These may be positive or negative. Examples include losing your job, moving into a new home, getting married, having a baby, or losing a loved one. Less obvious life events may also cause stress, especially if they occur day after day or in combination. Examples include working long hours, driving in traffic, caring for children, being in debt, or being in a difficult relationship. °SIGNS AND SYMPTOMS °Stress may cause emotional symptoms including, the following: °· Anxiety. This is feeling worried, afraid, on edge, overwhelmed, or out of control. °· Anger. This is feeling irritated or impatient. °· Depression. This is feeling sad, down, helpless, or guilty. °· Difficulty focusing, remembering, or making decisions. °Stress may cause physical symptoms, including the following:  °· Aches and pains. These may affect your head, neck, back, stomach, or other areas of your body. °· Tight muscles or clenched jaw. °· Low energy or trouble sleeping.  °Stress may cause unhealthy behaviors, including the following:  °· Eating to feel better (overeating) or skipping meals. °· Sleeping too little, too much, or both. °· Working  too much or putting off tasks (procrastination). °· Smoking, drinking alcohol, or using drugs to feel better. °DIAGNOSIS  °Stress is diagnosed through an assessment by your health care provider. Your health care provider will ask questions about your symptoms and any stressful life events. Your health care provider will also ask about your medical history and may order blood tests or other tests. Certain medical conditions and medicine can cause physical symptoms similar to stress.  Mental illness can cause emotional symptoms and unhealthy behaviors similar to stress. Your health care provider may refer you to a mental health professional for further evaluation.  °TREATMENT  °Stress management is the recommended treatment for stress. The goals of stress management are reducing stressful life events and coping with stress in healthy ways.  °Techniques for reducing stressful life events include the following: °· Stress identification. Self-monitor for stress and identify what causes stress for you. These skills may help you to avoid some stressful events. °· Time management. Set your priorities, keep a calendar of events, and learn to say "no." These tools can help you avoid making too many commitments. °Techniques for coping with stress include the following: °· Rethinking the problem. Try to think realistically about stressful events rather than ignoring them or overreacting. Try to find the positives in a stressful situation rather than focusing on the negatives. °· Exercise. Physical exercise can release both physical and emotional tension. The key is to find a form of exercise you enjoy and do it regularly. °· Relaxation techniques. These relax the body and mind. Examples include yoga, meditation, tai chi, biofeedback, deep   breathing, progressive muscle relaxation, listening to music, being out in nature, journaling, and other hobbies. Again, the key is to find one or more that you enjoy and can do  regularly.  Healthy lifestyle. Eat a balanced diet, get plenty of sleep, and do not smoke. Avoid using alcohol or drugs to relax.  Strong support network. Spend time with family, friends, or other people you enjoy being around.Express your feelings and talk things over with someone you trust. Counseling or talktherapy with a mental health professional may be helpful if you are having difficulty managing stress on your own. Medicine is typically not recommended for the treatment of stress.Talk to your health care provider if you think you need medicine for symptoms of stress. HOME CARE INSTRUCTIONS  Keep all follow-up visits as directed by your health care provider.  Take all medicines as directed by your health care provider. SEEK MEDICAL CARE IF:  Your symptoms get worse or you start having new symptoms.  You feel overwhelmed by your problems and can no longer manage them on your own. SEEK IMMEDIATE MEDICAL CARE IF:  You feel like hurting yourself or someone else. Document Released: 12/05/2000 Document Revised: 10/26/2013 Document Reviewed: 02/03/2013 [facility] Patient Information 2015 [place], [place]. This information is not intended to replace advice given to you by your health care provider. Make sure you discuss any questions you have with your health care provider. Hypertension Hypertension, commonly called high blood pressure, is when the force of blood pumping through your arteries is too strong. Your arteries are the blood vessels that carry blood from your heart throughout your body. A blood pressure reading consists of a higher number over a lower number, such as 110/72. The higher number (systolic) is the pressure inside your arteries when your heart pumps. The lower number (diastolic) is the pressure inside your arteries when your heart relaxes. Ideally you want your blood pressure below 120/80. Hypertension forces your heart to work harder to pump blood. Your arteries may  become narrow or stiff. Having hypertension puts you at risk for heart disease, stroke, and other problems.  RISK FACTORS Some risk factors for high blood pressure are controllable. Others are not.  Risk factors you cannot control include:   Race. You may be at higher risk if you are African American.  Age. Risk increases with age.  Gender. Men are at higher risk than women before age 73 years. After age 69, women are at higher risk than men. Risk factors you can control include:  Not getting enough exercise or physical activity.  Being overweight.  Getting too much fat, sugar, calories, or salt in your diet.  Drinking too much alcohol. SIGNS AND SYMPTOMS Hypertension does not usually cause signs or symptoms. Extremely high blood pressure (hypertensive crisis) may cause headache, anxiety, shortness of breath, and nosebleed. DIAGNOSIS  To check if you have hypertension, your health care provider will measure your blood pressure while you are seated, with your arm held at the level of your heart. It should be measured at least twice using the same arm. Certain conditions can cause a difference in blood pressure between your right and left arms. A blood pressure reading that is higher than normal on one occasion does not mean that you need treatment. If one blood pressure reading is high, ask your health care provider about having it checked again. TREATMENT  Treating high blood pressure includes making lifestyle changes and possibly taking medicine. Living a healthy lifestyle can help lower high blood  pressure. You may need to change some of your habits. Lifestyle changes may include:  Following the DASH diet. This diet is high in fruits, vegetables, and whole grains. It is low in salt, red meat, and added sugars.  Getting at least 2 hours of brisk physical activity every week.  Losing weight if necessary.  Not smoking.  Limiting alcoholic beverages.  Learning ways to reduce  stress. If lifestyle changes are not enough to get your blood pressure under control, your health care provider may prescribe medicine. You may need to take more than one. Work closely with your health care provider to understand the risks and benefits. HOME CARE INSTRUCTIONS  Have your blood pressure rechecked as directed by your health care provider.   Take medicines only as directed by your health care provider. Follow the directions carefully. Blood pressure medicines must be taken as prescribed. The medicine does not work as well when you skip doses. Skipping doses also puts you at risk for problems.   Do not smoke.   Monitor your blood pressure at home as directed by your health care provider. SEEK MEDICAL CARE IF:   You think you are having a reaction to medicines taken.  You have recurrent headaches or feel dizzy.  You have swelling in your ankles.  You have trouble with your vision. SEEK IMMEDIATE MEDICAL CARE IF:  You develop a severe headache or confusion.  You have unusual weakness, numbness, or feel faint.  You have severe chest or abdominal pain.  You vomit repeatedly.  You have trouble breathing. MAKE SURE YOU:   Understand these instructions.  Will watch your condition.  Will get help right away if you are not doing well or get worse. Document Released: 06/11/2005 Document Revised: 10/26/2013 Document Reviewed: 04/03/2013 [facility] Patient Information 2015 [place], [place]. This information is not intended to replace advice given to you by your health care provider. Make sure you discuss any questions you have with your health care provider.     Work Note:  To whom it concern:  [name]  was admitted to the Hospital from 08/19/2014 to 08/21/2014 and was discharged on 08/21/2014.  Ms. Sunya Wimes should be excused from work starting2/25/2016 and may return to work without any restrictions on 08/24/2014.  Call [facility] office  [phone] for any questions.  Sincerely, Cristal Ford, DO [facility] 08/21/2014, 10:19 AM

## 2014-08-23 ENCOUNTER — Ambulatory Visit: Payer: Self-pay | Attending: Physician Assistant | Admitting: Family Medicine

## 2014-08-23 DIAGNOSIS — I10 Essential (primary) hypertension: Secondary | ICD-10-CM

## 2014-08-23 MED ORDER — METOPROLOL SUCCINATE ER 25 MG PO TB24: 25.0000 mg | ORAL_TABLET | Freq: Every day | ORAL | Status: DC

## 2014-08-23 MED ORDER — ATORVASTATIN CALCIUM 20 MG PO TABS: 20.0000 mg | ORAL_TABLET | ORAL | Status: DC

## 2014-08-23 NOTE — Assessment & Plan Note (Signed)
Obj.  Patient is alert oriented, appropriate, in no distress. Neck is supple FROM w/o adenopathy or tenderness. Lungs are clear to auscultation. HS are regular, rapid at a rate of 97-100 and a S3 is present. Her BP today is 140/100 when repeated manually by me. Abdomen is soft, no organomegaly masses or tenderness.

## 2014-08-23 NOTE — Progress Notes (Signed)
Recommendations for Outpatient Follow-up:  Patient will be discharged home. She will need to establish care and follow-up with [facility] and wellness by calling them on Monday morning for an appointment. Patient will also need follow-up with Dr. Dorris Carnes, cardiologist, this appointment will be arranged. Patient should also follow-up with Dr. Kathyrn Sheriff, neurosurgeon, regarding aneurysm. Patient to continue taking her medications as prescribed. Patient should resume a heart healthy diet. Patient may resume activity as tolerated.  Discharge Diagnoses:  Elevated troponin/chest pressure/demand ischemia Left hand/facial numbness Aneurysm on MRI Accelerated hypertension Hyperlipidemia Stress and anxiety  Patient reports she is concerned she may be diabetic

## 2014-08-25 ENCOUNTER — Other Ambulatory Visit (HOSPITAL_COMMUNITY): Payer: Self-pay | Admitting: Neurosurgery

## 2014-08-25 DIAGNOSIS — I671 Cerebral aneurysm, nonruptured: Secondary | ICD-10-CM

## 2014-08-27 LAB — CULTURE, BLOOD (ROUTINE X 2)
Culture: NO GROWTH
Culture: NO GROWTH

## 2014-09-06 ENCOUNTER — Ambulatory Visit (INDEPENDENT_AMBULATORY_CARE_PROVIDER_SITE_OTHER): Payer: Self-pay | Admitting: Internal Medicine

## 2014-09-06 ENCOUNTER — Encounter: Payer: Self-pay | Admitting: Internal Medicine

## 2014-09-06 VITALS — BP 148/92 | HR 75 | Ht 62.0 in | Wt 115.0 lb

## 2014-09-06 DIAGNOSIS — I10 Essential (primary) hypertension: Secondary | ICD-10-CM

## 2014-09-06 MED ORDER — METOPROLOL SUCCINATE ER 50 MG PO TB24: 50.0000 mg | ORAL_TABLET | Freq: Every day | ORAL | Status: DC

## 2014-09-06 NOTE — Progress Notes (Signed)
Cardiology Office Note   Date:  09/06/2014   ID:  [name], DOB 03-25-1956, MRN [id number]  PCP:  No PCP Per Patient  Cardiologist:   Dorris Carnes, MD   Patinet returns for f/u of BP      History of Present Illness: [name] is a 59 y.o. female who I saw recently in the hospital  She went in with L sided numbness.  Developed some chest pressure  Found to have signif elevated BP on admit. Had trivial bump in troponin  Echo showed normal LV systolic function and mild LVH   Started on lipitor since LDL was 151 and patient had evid of plaquing on CT  She ambulated without problems and was sent home Since D/C she denies CP  Working  [employer]  No further numbness. Getting medical care at [facility].    Current Outpatient Prescriptions  Medication Sig Dispense Refill  . acetaminophen (TYLENOL) 500 MG tablet Take 1,000 mg by mouth every 6 (six) hours as needed for mild pain or headache.    [name] aspirin EC 81 MG tablet Take 1 tablet (81 mg total) by mouth daily.    [name] atorvastatin (LIPITOR) 20 MG tablet Take 1 tablet (20 mg total) by mouth daily after supper. 90 tablet 3  . lisinopril-hydrochlorothiazide (PRINZIDE) 10-12.5 MG per tablet Take 2 tablets by mouth at bedtime. 60 tablet 1  . metoprolol succinate (TOPROL-XL) 25 MG 24 hr tablet Take 1 tablet (25 mg total) by mouth daily. (Patient taking differently: Take 25 mg by mouth daily. ) 90 tablet 3   No current facility-administered medications for this visit.    Allergies:   Review of patient's allergies indicates no known allergies.   Past Medical History  Diagnosis Date  . Hypertension     Past Surgical History  Procedure Laterality Date  . Cesarean section    . Cholecystectomy       Social History:  The patient  reports that she has never smoked. She does not have any smokeless tobacco history on file. She reports that she drinks alcohol. She reports that she does not use illicit drugs.   Family History:   The patient's family history includes Heart disease in her father; Hypertension in her mother.    ROS:  Please see the history of present illness. All other systems are reviewed and  Negative to the above problem except as noted.    PHYSICAL EXAM: VS:  BP 148/92 mmHg  Pulse 75  Ht 5\' 2"  (1.575 m)  Wt 115 lb (52.164 kg)  BMI 21.03 kg/m2   BP 142/94 GEN: Well nourished, well developed, in no acute distress HEENT: normal Neck: no JVD, carotid bruits, or masses Cardiac: RRR; no murmurs, rubs, or gallops,no edema  Respiratory:  clear to auscultation bilaterally, normal work of breathing GI: soft, nontender, nondistended, + BS  No hepatomegaly  MS: no deformity Moving all extremities   Skin: warm and dry, no rash Neuro:  Strength and sensation are intact Psych: euthymic mood, full affect   EKG:  EKG is ordered not today.   Lipid Panel    Component Value Date/Time   CHOL 247* 08/20/2014 0157   TRIG 141 08/20/2014 0157   HDL 68 08/20/2014 0157   CHOLHDL 3.6 08/20/2014 0157   VLDL 28 08/20/2014 0157   LDLCALC 151* 08/20/2014 0157      Wt Readings from Last 3 Encounters:  09/06/14 115 lb (52.164 kg)  08/23/14 114 lb (51.71  kg)  08/19/14 115 lb 11.2 oz (52.481 kg)      ASSESSMENT AND PLAN:  1  Chestpressure  patinet denies  Follow  No testing  2.  HTN  BP is still high  She had metoprolol XL added a couple wks ago for increased BP and increased HR.  I would increase to 50 mg per day  She has appt for f/u at the wellness cneter.  3.  HL  On lipitor now  Will need to get lipids checked in 2 months   Goal in 80s  Keep on ASA  I will set f/u for 1 year  Sooner if develops chest tightness       Current medicines are reviewed at length with the patient today.  The patient does not have concerns regarding medicines.  The following changes have been made:  Increased metoprolol XL to 50   Labs/ tests ordered today include:  None   No orders of the defined types were  placed in this encounter.     Disposition:   FU with me in 1 year    Signed, Dorris Carnes, MD  09/06/2014 9:29 AM    [address], [place], [place]  [address] Phone: [phone]; Fax: [phone]

## 2014-09-06 NOTE — Patient Instructions (Signed)
Your physician has recommended you make the following change in your medication:  1.) increase Toprol XL to 50 mg once daily  Your physician wants you to follow-up in: 1 year with Dr. Harrington Challenger.  You will receive a reminder letter in the mail two months in advance. If you don't receive a letter, please call our office to schedule the follow-up appointment.

## 2014-09-21 ENCOUNTER — Other Ambulatory Visit (HOSPITAL_COMMUNITY): Payer: Self-pay | Admitting: Neurosurgery

## 2014-09-21 ENCOUNTER — Ambulatory Visit (HOSPITAL_COMMUNITY)
Admission: RE | Admit: 2014-09-21 | Discharge: 2014-09-21 | Disposition: A | Discharge status: Home / Self Care | Payer: Self-pay | Source: Ambulatory Visit | Attending: Neurosurgery | Admitting: Neurosurgery

## 2014-09-21 DIAGNOSIS — I671 Cerebral aneurysm, nonruptured: Secondary | ICD-10-CM | POA: Insufficient documentation

## 2014-09-21 DIAGNOSIS — I10 Essential (primary) hypertension: Secondary | ICD-10-CM | POA: Insufficient documentation

## 2014-09-21 DIAGNOSIS — Z7982 Long term (current) use of aspirin: Secondary | ICD-10-CM | POA: Insufficient documentation

## 2014-09-21 HISTORY — PX: IR GENERIC HISTORICAL: IMG1180011

## 2014-09-21 LAB — CBC WITH DIFFERENTIAL/PLATELET
Basophils Absolute: 0 10*3/uL (ref 0.0–0.1)
Basophils Relative: 0 % (ref 0–1)
Eosinophils Absolute: 0.2 10*3/uL (ref 0.0–0.7)
Eosinophils Relative: 2 % (ref 0–5)
HCT: 36.1 % (ref 36.0–46.0)
Hemoglobin: 11.9 g/dL — ABNORMAL LOW (ref 12.0–15.0)
Lymphocytes Relative: 29 % (ref 12–46)
Lymphs Abs: 3 10*3/uL (ref 0.7–4.0)
MCH: 28 pg (ref 26.0–34.0)
MCHC: 33 g/dL (ref 30.0–36.0)
MCV: 84.9 fL (ref 78.0–100.0)
Monocytes Absolute: 0.4 10*3/uL (ref 0.1–1.0)
Monocytes Relative: 4 % (ref 3–12)
Neutro Abs: 6.7 10*3/uL (ref 1.7–7.7)
Neutrophils Relative %: 65 % (ref 43–77)
Platelets: 256 10*3/uL (ref 150–400)
RBC: 4.25 MIL/uL (ref 3.87–5.11)
RDW: 12.8 % (ref 11.5–15.5)
WBC: 10.3 10*3/uL (ref 4.0–10.5)

## 2014-09-21 LAB — BASIC METABOLIC PANEL
Anion gap: 4 — ABNORMAL LOW (ref 5–15)
BUN: 15 mg/dL (ref 6–23)
CO2: 34 mmol/L — ABNORMAL HIGH (ref 19–32)
Calcium: 9.3 mg/dL (ref 8.4–10.5)
Chloride: 101 mmol/L (ref 96–112)
Creatinine, Ser: 0.69 mg/dL (ref 0.50–1.10)
GFR calc Af Amer: >90 mL/min (ref >90)
GFR calc non Af Amer: >90 mL/min (ref >90)
Glucose, Bld: 128 mg/dL — ABNORMAL HIGH (ref 70–99)
Potassium: 3.4 mmol/L — ABNORMAL LOW (ref 3.5–5.1)
Sodium: 139 mmol/L (ref 135–145)

## 2014-09-21 LAB — PROTIME-INR
INR: 0.98 (ref 0.00–1.49)
Prothrombin Time: 13.1 seconds (ref 11.6–15.2)

## 2014-09-21 LAB — APTT: aPTT: 28 seconds (ref 24–37)

## 2014-09-21 MED ORDER — HYDROCODONE-ACETAMINOPHEN 5-325 MG PO TABS: 1.0000 | ORAL_TABLET | ORAL | Status: DC | PRN

## 2014-09-21 MED ORDER — IOHEXOL 300 MG/ML  SOLN
150.0000 mL | Freq: Once | INTRAMUSCULAR | Status: AC | PRN
  Administered 2014-09-21: 80 mL via INTRA_ARTERIAL

## 2014-09-21 MED ORDER — SODIUM CHLORIDE 0.9 % IV SOLN
INTRAVENOUS | Status: DC
  Administered 2014-09-21: 12:00:00 via INTRAVENOUS

## 2014-09-21 MED ORDER — HEPARIN SODIUM (PORCINE) 1000 UNIT/ML IJ SOLN
INTRAMUSCULAR | Status: AC
  Filled 2014-09-21: qty 1

## 2014-09-21 MED ORDER — HEPARIN SODIUM (PORCINE) 1000 UNIT/ML IJ SOLN
INTRAMUSCULAR | Status: AC | PRN
  Administered 2014-09-21: 2000 [IU] via INTRAVENOUS

## 2014-09-21 MED ORDER — FENTANYL CITRATE 0.05 MG/ML IJ SOLN
INTRAMUSCULAR | Status: AC
  Filled 2014-09-21: qty 2

## 2014-09-21 MED ORDER — FENTANYL CITRATE 0.05 MG/ML IJ SOLN
INTRAMUSCULAR | Status: AC | PRN
  Administered 2014-09-21: 25 ug via INTRAVENOUS

## 2014-09-21 MED ORDER — MIDAZOLAM HCL 2 MG/2ML IJ SOLN
INTRAMUSCULAR | Status: AC
  Filled 2014-09-21: qty 2

## 2014-09-21 MED ORDER — MIDAZOLAM HCL 2 MG/2ML IJ SOLN
INTRAMUSCULAR | Status: AC | PRN
  Administered 2014-09-21: 0.5 mg via INTRAVENOUS

## 2014-09-21 MED ORDER — LIDOCAINE HCL 1 % IJ SOLN
INTRAMUSCULAR | Status: AC
Start: 2014-09-21 — End: 2014-09-21
  Filled 2014-09-21: qty 20

## 2014-09-21 NOTE — Discharge Instructions (Signed)
Angiogram, Care After Refer to this sheet in the next few weeks. These instructions provide you with information on caring for yourself after your procedure. Your health care provider may also give you more specific instructions. Your treatment has been planned according to current medical practices, but problems sometimes occur. Call your health care provider if you have any problems or questions after your procedure.  WHAT TO EXPECT AFTER THE PROCEDURE After your procedure, it is typical to have the following sensations:  Minor discomfort or tenderness and a small bump at the catheter insertion site. The bump should usually decrease in size and tenderness within 1 to 2 weeks.  Any bruising will usually fade within 2 to 4 weeks. HOME CARE INSTRUCTIONS   You may need to keep taking blood thinners if they were prescribed for you. Take medicines only as directed by your health care provider.  Do not apply powder or lotion to the site.  Do not take baths, swim, or use a hot tub until your health care provider approves.  You may shower 24 hours after the procedure. Remove the bandage (dressing) and gently wash the site with plain soap and water. Gently pat the site dry.  Inspect the site at least twice daily.  Limit your activity for the first 48 hours. Do not bend, squat, or lift anything over 20 lb (9 kg) or as directed by your health care provider.  Plan to have someone take you home after the procedure. Follow instructions about when you can drive or return to work. SEEK MEDICAL CARE IF:  You get light-headed when standing up.  You have drainage (other than a small amount of blood on the dressing).  You have chills.  You have a fever.  You have redness, warmth, swelling, or pain at the insertion site. SEEK IMMEDIATE MEDICAL CARE IF:   You develop chest pain or shortness of breath, feel faint, or pass out.  You have bleeding, swelling larger than a walnut, or drainage from the  catheter insertion site.  You develop pain, discoloration, coldness, or severe bruising in the leg or arm that held the catheter.  You develop bleeding from any other place, such as the bowels. You may see bright red blood in your urine or stools, or your stools may appear black and tarry.  You have heavy bleeding from the site. If this happens, hold pressure on the site. MAKE SURE YOU:  Understand these instructions.  Will watch your condition.  Will get help right away if you are not doing well or get worse. Document Released: 12/28/2004 Document Revised: 10/26/2013 Document Reviewed: 11/03/2012 [facility] Patient Information 2015 [place], [place]. This information is not intended to replace advice given to you by your health care provider. Make sure you discuss any questions you have with your health care provider.                        Return To Work [employer] _________________________________________________ was treated at our facility. RETURN TO WORK  Employee may return to work on: _____3/31/16______________with no restrictions__________________ WORK ACTIVITY [place] Work activities not tolerated include: _____ Bending _____ Prolonged sitting _____ Lifting _____ Squatting _____ Prolonged standing _____ Lesle Reek _____ Reaching _____ Pushing and pulling _____ Walking _____ Other ____________________ Show this Return to Work statement to Optician, dispensing at work as soon as possible. Your employer should be aware of your condition and can help with the necessary work activity restrictions. If you wish to  return to work sooner than the date above, or if you have further problems which make it difficult for you to return at that time, please call us or your caregiver. _____Dr. Nundkumar_______    _________________________________________ Signature  _____3/29/16____________________________________ Date Document Released: 06/11/2005 Document  Revised: 09/03/2011 Document Reviewed: 11/26/2006 [employer] Patient Information 2015 [place], [place]. This information is not intended to replace advice given to you by your health care provider. Make sure you discuss any questions you have with your health care provider.

## 2014-09-21 NOTE — Sedation Documentation (Signed)
Pt placed on 02 for procedure 2L/[place]

## 2014-09-21 NOTE — Sedation Documentation (Signed)
Exoseal in place to R groin, bedrest  Will be 3 hrs.

## 2014-09-21 NOTE — Progress Notes (Signed)
PREOP DX: LICA aneurysm  POSTOP DX: Same  PROCEDURE: Diagnostic cerebral angiogram  SURGEON: Dr. [name], MD  ANESTHESIA: IV Sedation with Local  EBL: Minimal  SPECIMENS: None  COMPLICATIONS: None  CONDITION: Stable to recovery  FINDINGS: 1. 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 No other aneurysms, AVM, or fistulas seen.

## 2014-09-21 NOTE — H&P (Signed)
CC:  No chief complaint on file.   HPI: [name] is a 59 y.o. female initially seen in the office after she was previously seen in the ED for facial numbness. W/U included MRI/MRA which demonstrates an incidental LICA aneurysm. She was subsequently seen in the clinic and now presents for further w/u with diagnostic cerebral angiogram.  PMH: Past Medical History  Diagnosis Date  . Hypertension     PSH: Past Surgical History  Procedure Laterality Date  . Cesarean section    . Cholecystectomy      SH: History  Substance Use Topics  . Smoking status: Never Smoker   . Smokeless tobacco: Not on file  . Alcohol Use: Yes     Comment: ocassionally    MEDS: Prior to Admission medications   Medication Sig Start Date End Date Taking? Authorizing Provider  acetaminophen (TYLENOL) 500 MG tablet Take 1,000 mg by mouth every 6 (six) hours as needed for mild pain or headache.   Yes Historical Provider, MD  aspirin EC 81 MG tablet Take 1 tablet (81 mg total) by mouth daily. 08/21/14  Yes Maryann Mikhail, DO  atorvastatin (LIPITOR) 20 MG tablet Take 1 tablet (20 mg total) by mouth daily after supper. 08/23/14  Yes Micheline Chapman, NP  lisinopril-hydrochlorothiazide (PRINZIDE) 10-12.5 MG per tablet Take 2 tablets by mouth at bedtime. 08/21/14  Yes Maryann Mikhail, DO  metoprolol succinate (TOPROL-XL) 50 MG 24 hr tablet Take 1 tablet (50 mg total) by mouth daily. 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 All questions were answered, and consent was obtained.

## 2014-09-21 NOTE — Sedation Documentation (Signed)
IR tech will place exoseal

## 2014-09-24 ENCOUNTER — Ambulatory Visit: Payer: Self-pay | Admitting: Family Medicine

## 2014-10-06 ENCOUNTER — Encounter: Payer: Self-pay | Admitting: Family Medicine

## 2014-10-06 ENCOUNTER — Ambulatory Visit: Payer: Self-pay | Attending: Family Medicine | Admitting: Family Medicine

## 2014-10-06 VITALS — BP 126/78 | HR 69 | Temp 97.4°F | Resp 18 | Ht 62.0 in | Wt 117.0 lb

## 2014-10-06 DIAGNOSIS — G479 Sleep disorder, unspecified: Secondary | ICD-10-CM | POA: Insufficient documentation

## 2014-10-06 DIAGNOSIS — M25531 Pain in right wrist: Secondary | ICD-10-CM | POA: Insufficient documentation

## 2014-10-06 DIAGNOSIS — I671 Cerebral aneurysm, nonruptured: Secondary | ICD-10-CM

## 2014-10-06 DIAGNOSIS — I10 Essential (primary) hypertension: Secondary | ICD-10-CM | POA: Insufficient documentation

## 2014-10-06 DIAGNOSIS — Z8673 Personal history of transient ischemic attack (TIA), and cerebral infarction without residual deficits: Secondary | ICD-10-CM | POA: Insufficient documentation

## 2014-10-06 DIAGNOSIS — R232 Flushing: Secondary | ICD-10-CM | POA: Insufficient documentation

## 2014-10-06 DIAGNOSIS — M25532 Pain in left wrist: Secondary | ICD-10-CM | POA: Insufficient documentation

## 2014-10-06 DIAGNOSIS — N951 Menopausal and female climacteric states: Secondary | ICD-10-CM | POA: Insufficient documentation

## 2014-10-06 MED ORDER — DICLOFENAC SODIUM 1 % TD GEL: 2.0000 g | Freq: Four times a day (QID) | TRANSDERMAL | Status: DC

## 2014-10-06 NOTE — Assessment & Plan Note (Signed)
A: noted during hospitalization in feb 2016 for TIA in the setting of HTN P: Referral to neurosurgery today

## 2014-10-06 NOTE — Progress Notes (Signed)
Patient here to establish PCP. Patient reports no pain. Patient had aneurysm behind left eye, found last month. In care of neurosurgery for aneurysm.

## 2014-10-06 NOTE — Assessment & Plan Note (Signed)
A: BP at goal Med: compliant P: continue current regimen  

## 2014-10-06 NOTE — Assessment & Plan Note (Signed)
A: b/l radial wrist pain P:  X-rays Compression with splints Topical diclofenac

## 2014-10-06 NOTE — Progress Notes (Signed)
   Subjective:    Patient ID: [name], female    DOB: 1956/02/20, 59 y.o.   MRN: [id number] CC: f/u HTN, wrist pain x 1 year  HPI  1. CHRONIC HYPERTENSION  Disease Monitoring  Blood pressure range: not checking   Chest pain: no   Dyspnea: no   Claudication: no   Medication compliance: yes  Medication Side Effects  Lightheadedness: no   Urinary frequency: no   Edema: no    Preventitive Healthcare:  Exercise: yes   Diet Pattern: regular meals, some junk food   Salt Restriction: yes   2. Wrist pain: b/l. X 1 yr. No injury. Swelling. Tender. No redness. fam hx of arthritis.  3. Hot flashes: x 2 years. Dry during the day. Excessive sweating at night. Interfering with quality of sleep. Causing some depressed mood and irritability.    Soc Hx: non smoker  Review of Systems  Musculoskeletal: Positive for arthralgias.  Neurological: Negative for weakness and numbness.  Psychiatric/Behavioral: Positive for sleep disturbance and dysphoric mood.       Objective:   Physical Exam BP 126/78 mmHg  Pulse 69  Temp(Src) 97.4 F (36.3 C) (Oral)  Resp 18  Ht 5\' 2"  (1.575 m)  Wt 117 lb (53.071 kg)  BMI 21.39 kg/m2  SpO2 100% General appearance: no distress Neck: no adenopathy, supple, symmetrical, trachea midline and thyroid not enlarged, symmetric, no tenderness/mass/nodules Lungs: clear to auscultation bilaterally Heart: regular rate and rhythm, S1, S2 normal, no murmur, click, rub or gallop  Ext; no peripheral edema, b/l radial wrist with mild deformity (hypertrophy) swelling.       Assessment & Plan:

## 2014-10-06 NOTE — Patient Instructions (Addendum)
[name],  1.  For HTN:  BP goal is < 150/90. You are at goal. continue toprol 50 mg  Continue prinzide 2 tabs of 10/12.5 DASH diet- see below  2. For wrist pain: I suspect arthritis. X-rays at your convenience Compression with splints Topical diclofenac   3. For hot flashes:  In your case, I recommend hormone replacement therapy as long as your pap smear and mammogram normal.   For mammogram:  Please call Rolena Infante, [phone],  with the BCCCP (breast and cervical cancer control program) at the [facility] to set up an appointment to verify eligibility for a breast exam, mammogram, ultrasound. If you qualify this will be set up at [facility]. Once you have the orange card I can also order one.   For colonoscopy: orange card will cover referral to GI.  Please apply for Advance discount and orange card, you can also inquire if any of your medications are on the PASS (medications assistance) list.   F/u with me in about  4 weeks for pap smear  Dr. Adrian Blackwater    DASH Eating Plan DASH stands for "Dietary Approaches to Stop Hypertension." The DASH eating plan is a healthy eating plan that has been shown to reduce high blood pressure (hypertension). Additional health benefits may include reducing the risk of type 2 diabetes mellitus, heart disease, and stroke. The DASH eating plan may also help with weight loss. WHAT DO I NEED TO KNOW ABOUT THE DASH EATING PLAN? For the DASH eating plan, you will follow these general guidelines:  Choose foods with a percent daily value for sodium of less than 5% (as listed on the food label).  Use salt-free seasonings or herbs instead of table salt or sea salt.  Check with your health care provider or pharmacist before using salt substitutes.  Eat lower-sodium products, often labeled as "lower sodium" or "no salt added."  Eat fresh foods.  Eat more vegetables, fruits, and low-fat dairy products.  Choose whole grains. Look for the  word "whole" as the first word in the ingredient list.  Choose fish and skinless chicken or [place] more often than red meat. Limit fish, poultry, and meat to 6 oz (170 g) each day.  Limit sweets, desserts, sugars, and sugary drinks.  Choose heart-healthy fats.  Limit cheese to 1 oz (28 g) per day.  Eat more home-cooked food and less restaurant, buffet, and fast food.  Limit fried foods.  Cook foods using methods other than frying.  Limit canned vegetables. If you do use them, rinse them well to decrease the sodium.  When eating at a restaurant, ask that your food be prepared with less salt, or no salt if possible. WHAT FOODS CAN I EAT? Seek help from a dietitian for individual calorie needs. Grains Whole grain or whole wheat bread. Brown rice. Whole grain or whole wheat pasta. Quinoa, bulgur, and whole grain cereals. Low-sodium cereals. Corn or whole wheat flour tortillas. Whole grain cornbread. Whole grain crackers. Low-sodium crackers. Vegetables Fresh or frozen vegetables (raw, steamed, roasted, or grilled). Low-sodium or reduced-sodium tomato and vegetable juices. Low-sodium or reduced-sodium tomato sauce and paste. Low-sodium or reduced-sodium canned vegetables.  Fruits All fresh, canned (in natural juice), or frozen fruits. Meat and Other Protein Products Ground beef (85% or leaner), grass-fed beef, or beef trimmed of fat. Skinless chicken or [place]. Ground chicken or [place]. Pork trimmed of fat. All fish and seafood. Eggs. Dried beans, peas, or lentils. Unsalted nuts and seeds. Unsalted canned  beans. Dairy Low-fat dairy products, such as skim or 1% milk, 2% or reduced-fat cheeses, low-fat ricotta or cottage cheese, or plain low-fat yogurt. Low-sodium or reduced-sodium cheeses. Fats and Oils Tub margarines without trans fats. Light or reduced-fat mayonnaise and salad dressings (reduced sodium). Avocado. Safflower, olive, or canola oils. Natural peanut or almond  butter. Other Unsalted popcorn and pretzels. The items listed above may not be a complete list of recommended foods or beverages. Contact your dietitian for more options. WHAT FOODS ARE NOT RECOMMENDED? Grains White bread. White pasta. White rice. Refined cornbread. Bagels and croissants. Crackers that contain trans fat. Vegetables Creamed or fried vegetables. Vegetables in a cheese sauce. Regular canned vegetables. Regular canned tomato sauce and paste. Regular tomato and vegetable juices. Fruits Dried fruits. Canned fruit in light or heavy syrup. Fruit juice. Meat and Other Protein Products Fatty cuts of meat. Ribs, chicken wings, bacon, sausage, bologna, salami, chitterlings, fatback, hot dogs, bratwurst, and packaged luncheon meats. Salted nuts and seeds. Canned beans with salt. Dairy Whole or 2% milk, cream, half-and-half, and cream cheese. Whole-fat or sweetened yogurt. Full-fat cheeses or blue cheese. Nondairy creamers and whipped toppings. Processed cheese, cheese spreads, or cheese curds. Condiments Onion and garlic salt, seasoned salt, table salt, and sea salt. Canned and packaged gravies. Worcestershire sauce. Tartar sauce. Barbecue sauce. Teriyaki sauce. Soy sauce, including reduced sodium. Steak sauce. Fish sauce. Oyster sauce. Cocktail sauce. Horseradish. Ketchup and mustard. Meat flavorings and tenderizers. Bouillon cubes. Hot sauce. Tabasco sauce. Marinades. Taco seasonings. Relishes. Fats and Oils Butter, stick margarine, lard, shortening, ghee, and bacon fat. Coconut, palm kernel, or palm oils. Regular salad dressings. Other Pickles and olives. Salted popcorn and pretzels. The items listed above may not be a complete list of foods and beverages to avoid. Contact your dietitian for more information. WHERE CAN I FIND MORE INFORMATION? National Heart, Lung, and Blood Institute: travelstabloid.com Document Released: 05/31/2011 Document Revised:  10/26/2013 Document Reviewed: 04/15/2013 [facility] Patient Information 2015 [place], [place]. This information is not intended to replace advice given to you by your health care provider. Make sure you discuss any questions you have with your health care provider.

## 2014-10-08 ENCOUNTER — Ambulatory Visit (HOSPITAL_COMMUNITY)
Admission: RE | Admit: 2014-10-08 | Discharge: 2014-10-08 | Disposition: A | Discharge status: Home / Self Care | Payer: Self-pay | Source: Ambulatory Visit | Attending: Family Medicine | Admitting: Family Medicine

## 2014-10-08 DIAGNOSIS — M25531 Pain in right wrist: Secondary | ICD-10-CM | POA: Insufficient documentation

## 2014-10-08 DIAGNOSIS — M25532 Pain in left wrist: Secondary | ICD-10-CM | POA: Insufficient documentation

## 2014-10-12 ENCOUNTER — Telehealth: Payer: Self-pay | Admitting: Family Medicine

## 2014-10-12 NOTE — Telephone Encounter (Signed)
Patient is calling to request x-ray results, please f/u with pt

## 2014-10-12 NOTE — Telephone Encounter (Signed)
Patient is calling to request x-ray results, please f/u with pt.

## 2014-10-13 NOTE — Telephone Encounter (Signed)
LVM to return call.

## 2014-10-13 NOTE — Telephone Encounter (Signed)
-----   Message from Lorayne Marek, MD sent at 10/11/2014  1:21 PM EDT ----- Call and let the patient know that her x-rays of the wrist reported degenerative changes/joint arthritis.

## 2014-10-13 NOTE — Telephone Encounter (Signed)
-----   Message from Lorayne Marek, MD sent at 10/11/2014  1:22 PM EDT ----- Call and Let the patient know that her x-ray right wrist reported  arthritis  /degenerative changes.

## 2014-10-14 ENCOUNTER — Inpatient Hospital Stay (HOSPITAL_COMMUNITY)
Admission: EM | Admit: 2014-10-14 | Discharge: 2014-10-16 | DRG: 392 | Disposition: A | Discharge status: Home / Self Care | Payer: Self-pay | Attending: Internal Medicine | Admitting: Internal Medicine

## 2014-10-14 ENCOUNTER — Emergency Department (HOSPITAL_COMMUNITY): Payer: Self-pay

## 2014-10-14 ENCOUNTER — Encounter (HOSPITAL_COMMUNITY): Payer: Self-pay | Admitting: Emergency Medicine

## 2014-10-14 DIAGNOSIS — R509 Fever, unspecified: Secondary | ICD-10-CM | POA: Diagnosis present

## 2014-10-14 DIAGNOSIS — N281 Cyst of kidney, acquired: Secondary | ICD-10-CM | POA: Diagnosis present

## 2014-10-14 DIAGNOSIS — E872 Acidosis, unspecified: Secondary | ICD-10-CM | POA: Diagnosis present

## 2014-10-14 DIAGNOSIS — I10 Essential (primary) hypertension: Secondary | ICD-10-CM | POA: Insufficient documentation

## 2014-10-14 DIAGNOSIS — E861 Hypovolemia: Secondary | ICD-10-CM | POA: Diagnosis present

## 2014-10-14 DIAGNOSIS — E876 Hypokalemia: Secondary | ICD-10-CM | POA: Diagnosis present

## 2014-10-14 DIAGNOSIS — R197 Diarrhea, unspecified: Secondary | ICD-10-CM

## 2014-10-14 DIAGNOSIS — R5381 Other malaise: Secondary | ICD-10-CM

## 2014-10-14 DIAGNOSIS — N135 Crossing vessel and stricture of ureter without hydronephrosis: Secondary | ICD-10-CM | POA: Diagnosis present

## 2014-10-14 DIAGNOSIS — K529 Noninfective gastroenteritis and colitis, unspecified: Principal | ICD-10-CM | POA: Diagnosis present

## 2014-10-14 DIAGNOSIS — D259 Leiomyoma of uterus, unspecified: Secondary | ICD-10-CM | POA: Diagnosis present

## 2014-10-14 DIAGNOSIS — R112 Nausea with vomiting, unspecified: Secondary | ICD-10-CM | POA: Diagnosis present

## 2014-10-14 DIAGNOSIS — Z7982 Long term (current) use of aspirin: Secondary | ICD-10-CM

## 2014-10-14 LAB — CBC WITH DIFFERENTIAL/PLATELET
Basophils Absolute: 0 10*3/uL (ref 0.0–0.1)
Basophils Relative: 0 % (ref 0–1)
Eosinophils Absolute: 0.1 10*3/uL (ref 0.0–0.7)
Eosinophils Relative: 0 % (ref 0–5)
HCT: 44 % (ref 36.0–46.0)
Hemoglobin: 14.4 g/dL (ref 12.0–15.0)
Lymphocytes Relative: 11 % — ABNORMAL LOW (ref 12–46)
Lymphs Abs: 2.6 10*3/uL (ref 0.7–4.0)
MCH: 28.1 pg (ref 26.0–34.0)
MCHC: 32.7 g/dL (ref 30.0–36.0)
MCV: 85.8 fL (ref 78.0–100.0)
Monocytes Absolute: 0.9 10*3/uL (ref 0.1–1.0)
Monocytes Relative: 4 % (ref 3–12)
Neutro Abs: 20.3 10*3/uL — ABNORMAL HIGH (ref 1.7–7.7)
Neutrophils Relative %: 85 % — ABNORMAL HIGH (ref 43–77)
Platelets: 389 10*3/uL (ref 150–400)
RBC: 5.13 MIL/uL — ABNORMAL HIGH (ref 3.87–5.11)
RDW: 13.1 % (ref 11.5–15.5)
WBC: 23.8 10*3/uL — ABNORMAL HIGH (ref 4.0–10.5)

## 2014-10-14 LAB — COMPREHENSIVE METABOLIC PANEL WITH GFR
ALT: 27 U/L (ref 0–35)
AST: 31 U/L (ref 0–37)
Albumin: 5.2 g/dL (ref 3.5–5.2)
Alkaline Phosphatase: 101 U/L (ref 39–117)
Anion gap: 18 — ABNORMAL HIGH (ref 5–15)
BUN: 20 mg/dL (ref 6–23)
CO2: 22 mmol/L (ref 19–32)
Calcium: 10.7 mg/dL — ABNORMAL HIGH (ref 8.4–10.5)
Chloride: 101 mmol/L (ref 96–112)
Creatinine, Ser: 0.77 mg/dL (ref 0.50–1.10)
GFR calc Af Amer: >90 mL/min
GFR calc non Af Amer: 89 mL/min — ABNORMAL LOW
Glucose, Bld: 211 mg/dL — ABNORMAL HIGH (ref 70–99)
Potassium: 2.8 mmol/L — ABNORMAL LOW (ref 3.5–5.1)
Sodium: 141 mmol/L (ref 135–145)
Total Bilirubin: 0.5 mg/dL (ref 0.3–1.2)
Total Protein: 9.1 g/dL — ABNORMAL HIGH (ref 6.0–8.3)

## 2014-10-14 LAB — I-STAT CG4 LACTIC ACID, ED
Lactic Acid, Venous: 6.25 mmol/L (ref 0.5–2.0)
Lactic Acid, Venous: 4.53 mmol/L (ref 0.5–2.0)

## 2014-10-14 LAB — LIPASE, BLOOD: Lipase: 20 U/L (ref 11–59)

## 2014-10-14 LAB — URINALYSIS, ROUTINE W REFLEX MICROSCOPIC
Bilirubin Urine: NEGATIVE
Glucose, UA: NEGATIVE mg/dL
Ketones, ur: NEGATIVE mg/dL
Nitrite: NEGATIVE
Protein, ur: 30 mg/dL — AB
Specific Gravity, Urine: 1.016 (ref 1.005–1.030)
Urobilinogen, UA: 0.2 mg/dL (ref 0.0–1.0)
pH: 5.5 (ref 5.0–8.0)

## 2014-10-14 LAB — URINE MICROSCOPIC-ADD ON

## 2014-10-14 LAB — TROPONIN I
Troponin I: <0.03 ng/mL (ref <0.031)
Troponin I: <0.03 ng/mL (ref <0.031)

## 2014-10-14 LAB — PROCALCITONIN: Procalcitonin: 0.27 ng/mL

## 2014-10-14 MED ORDER — PIPERACILLIN-TAZOBACTAM 3.375 G IVPB 30 MIN
3.3750 g | Freq: Once | INTRAVENOUS | Status: AC
  Administered 2014-10-14: 3.375 g via INTRAVENOUS
  Filled 2014-10-14: qty 50

## 2014-10-14 MED ORDER — ALUM & MAG HYDROXIDE-SIMETH 200-200-20 MG/5ML PO SUSP: 30.0000 mL | Freq: Four times a day (QID) | ORAL | Status: DC | PRN

## 2014-10-14 MED ORDER — ACETAMINOPHEN 650 MG RE SUPP
650.0000 mg | Freq: Four times a day (QID) | RECTAL | Status: DC | PRN
Start: 2014-10-14 — End: 2014-10-16

## 2014-10-14 MED ORDER — SODIUM CHLORIDE 0.9 % IJ SOLN
3.0000 mL | Freq: Two times a day (BID) | INTRAMUSCULAR | Status: DC
  Administered 2014-10-14: 3 mL via INTRAVENOUS

## 2014-10-14 MED ORDER — POTASSIUM CHLORIDE 10 MEQ/100ML IV SOLN
10.0000 meq | INTRAVENOUS | Status: DC
  Administered 2014-10-14 (×3): 10 meq via INTRAVENOUS
  Filled 2014-10-14 (×4): qty 100

## 2014-10-14 MED ORDER — POTASSIUM CHLORIDE IN NACL 20-0.9 MEQ/L-% IV SOLN
INTRAVENOUS | Status: DC
  Administered 2014-10-14 – 2014-10-16 (×3): via INTRAVENOUS
  Filled 2014-10-14 (×4): qty 1000

## 2014-10-14 MED ORDER — IOHEXOL 300 MG/ML  SOLN
100.0000 mL | Freq: Once | INTRAMUSCULAR | Status: AC | PRN
  Administered 2014-10-14: 100 mL via INTRAVENOUS

## 2014-10-14 MED ORDER — SODIUM CHLORIDE 0.9 % IV SOLN: INTRAVENOUS | Status: DC

## 2014-10-14 MED ORDER — ONDANSETRON HCL 4 MG/2ML IJ SOLN
4.0000 mg | Freq: Once | INTRAMUSCULAR | Status: AC
  Administered 2014-10-14: 4 mg via INTRAVENOUS
  Filled 2014-10-14: qty 2

## 2014-10-14 MED ORDER — FENTANYL CITRATE (PF) 100 MCG/2ML IJ SOLN
50.0000 ug | Freq: Once | INTRAMUSCULAR | Status: AC
  Administered 2014-10-14: 50 ug via INTRAVENOUS
  Filled 2014-10-14: qty 2

## 2014-10-14 MED ORDER — ONDANSETRON HCL 4 MG PO TABS: 4.0000 mg | ORAL_TABLET | Freq: Four times a day (QID) | ORAL | Status: DC | PRN

## 2014-10-14 MED ORDER — HYDROCODONE-ACETAMINOPHEN 5-325 MG PO TABS: 1.0000 | ORAL_TABLET | ORAL | Status: DC | PRN

## 2014-10-14 MED ORDER — ASPIRIN EC 81 MG PO TBEC
81.0000 mg | DELAYED_RELEASE_TABLET | Freq: Every day | ORAL | Status: DC
  Administered 2014-10-14 – 2014-10-16 (×3): 81 mg via ORAL
  Filled 2014-10-14 (×3): qty 1

## 2014-10-14 MED ORDER — METOPROLOL SUCCINATE ER 50 MG PO TB24
50.0000 mg | ORAL_TABLET | Freq: Every day | ORAL | Status: DC
  Administered 2014-10-14 – 2014-10-16 (×3): 50 mg via ORAL
  Filled 2014-10-14 (×3): qty 1

## 2014-10-14 MED ORDER — LORAZEPAM 2 MG/ML IJ SOLN
1.0000 mg | Freq: Once | INTRAMUSCULAR | Status: AC
  Administered 2014-10-14: 1 mg via INTRAVENOUS
  Filled 2014-10-14: qty 1

## 2014-10-14 MED ORDER — SODIUM CHLORIDE 0.9 % IV BOLUS (SEPSIS)
1000.0000 mL | Freq: Once | INTRAVENOUS | Status: AC
  Administered 2014-10-14: 1000 mL via INTRAVENOUS

## 2014-10-14 MED ORDER — PROMETHAZINE HCL 25 MG/ML IJ SOLN
25.0000 mg | Freq: Once | INTRAMUSCULAR | Status: AC
  Administered 2014-10-14: 25 mg via INTRAVENOUS
  Filled 2014-10-14: qty 1

## 2014-10-14 MED ORDER — ACETAMINOPHEN 325 MG PO TABS
650.0000 mg | ORAL_TABLET | Freq: Four times a day (QID) | ORAL | Status: DC | PRN
  Administered 2014-10-16: 650 mg via ORAL
  Filled 2014-10-14: qty 2

## 2014-10-14 MED ORDER — ONDANSETRON HCL 4 MG/2ML IJ SOLN: 4.0000 mg | Freq: Three times a day (TID) | INTRAMUSCULAR | Status: DC | PRN

## 2014-10-14 MED ORDER — ONDANSETRON HCL 4 MG/2ML IJ SOLN: 4.0000 mg | Freq: Four times a day (QID) | INTRAMUSCULAR | Status: DC | PRN

## 2014-10-14 NOTE — ED Notes (Signed)
Pt to ED co NVD since this AM. once in ED pt started feeling chest. Pt is actively vomiting and has chills. Pt is alert and oriented.

## 2014-10-14 NOTE — ED Notes (Signed)
CODE SEPSIS ll CALLED.Marland KitchenKLJ

## 2014-10-14 NOTE — H&P (Signed)
PCP:   Minerva Ends, MD   Chief Complaint:  N/v/d  HPI: 59  Yo female h/o htn works at [employer] comes in with sudden onset of several episodes of bilious vomiting and nonbloody diarrhea with generalized abd cramps and muscle aches all over.  Chills at home.  Some coughing and nasal congestion also.  Hurts all over.  No dysuria or hematuria.  Several of her grandbabies have had the same thing and they were diagnosed with the flu in the last couple of days and were having GI symptoms.  She also experienced some chest pressure which was right above the epigastrum area earlier when she was vomiting.  No le edema or swelling.  No sob.  Has received appropriate ivf bolus per sepsis protocol in the ED and she is feeling much better and wants to eat something.  Her initial lactic acid level was 6.  Given a dose of zosyn although source of infection has not revealed itself with w/u in ED.  No rashes.  Review of Systems:  Positive and negative as per HPI otherwise all other systems are negative  Past Medical History: Past Medical History  Diagnosis Date  . Hypertension    Past Surgical History  Procedure Laterality Date  . Cesarean section    . Cholecystectomy      Medications: Prior to Admission medications   Medication Sig Start Date End Date Taking? Authorizing Provider  acetaminophen (TYLENOL) 500 MG tablet Take 1,000 mg by mouth every 6 (six) hours as needed for mild pain or headache.   Yes Historical Provider, MD  aspirin EC 81 MG tablet Take 1 tablet (81 mg total) by mouth daily. 08/21/14  Yes Maryann Mikhail, DO  atorvastatin (LIPITOR) 20 MG tablet Take 1 tablet (20 mg total) by mouth daily after supper. 08/23/14  Yes Micheline Chapman, NP  diclofenac sodium (VOLTAREN) 1 % GEL Apply 2 g topically 4 (four) times daily. Patient taking differently: Apply 2 g topically 4 (four) times daily as needed (pain).  10/06/14  Yes Josalyn Funches, MD  lisinopril-hydrochlorothiazide (PRINZIDE) 10-12.5  MG per tablet Take 2 tablets by mouth at bedtime. 08/21/14  Yes Maryann Mikhail, DO  metoprolol succinate (TOPROL-XL) 50 MG 24 hr tablet Take 1 tablet (50 mg total) by mouth daily. 09/06/14  Yes Fay Records, MD    Allergies:  No Known Allergies  Social History:  reports that she has never smoked. She has never used smokeless tobacco. She reports that she drinks alcohol. She reports that she does not use illicit drugs.  Family History: Family History  Problem Relation Age of Onset  . Hypertension Mother   . Arthritis Mother   . Heart disease Father   . Cancer Neg Hx     Physical Exam: Filed Vitals:   10/14/14 1456 10/14/14 1530 10/14/14 1836 10/14/14 1916  BP:  211/115 177/104 172/91  Pulse:  73  90  Temp:      TempSrc:      Resp:  16  16  Weight: 56.7 kg (125 lb)     SpO2:  100%  93%   General appearance: alert, cooperative and no distress Head: Normocephalic, without obvious abnormality, atraumatic Eyes: negative Nose: Nares normal. Septum midline. Mucosa normal. No drainage or sinus tenderness. Neck: no JVD and supple, symmetrical, trachea midline Lungs: clear to auscultation bilaterally Heart: regular rate and rhythm, S1, S2 normal, no murmur, click, rub or gallop Abdomen: soft, non-tender; bowel sounds normal; no masses,  no  organomegaly Extremities: extremities normal, atraumatic, no cyanosis or edema Pulses: 2+ and symmetric Skin: Skin color, texture, turgor normal. No rashes or lesions Neurologic: Grossly normal    Labs on Admission:   Recent Labs  10/14/14 1318  NA 141  K 2.8*  CL 101  CO2 22  GLUCOSE 211*  BUN 20  CREATININE 0.77  CALCIUM 10.7*    Recent Labs  10/14/14 1318  AST 31  ALT 27  ALKPHOS 101  BILITOT 0.5  PROT 9.1*  ALBUMIN 5.2    Recent Labs  10/14/14 1318  LIPASE 20    Recent Labs  10/14/14 1318  WBC 23.8*  NEUTROABS 20.3*  HGB 14.4  HCT 44.0  MCV 85.8  PLT 389    Recent Labs  10/14/14 1318  TROPONINI  <0.03   Radiological Exams on Admission:  Ct Abdomen Pelvis W Contrast  10/14/2014   CLINICAL DATA:  nausea vomiting diarrhea that started today. States she's had chills. Decreased appetite. 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 Fevers of up to 103 at home. States she feels horrible. Also some dull chest pain. No dysuriaHx Cholecystectomy, HTN  EXAM: CT ABDOMEN AND PELVIS WITH CONTRAST  TECHNIQUE: Multidetector CT imaging of the abdomen and pelvis was performed using the standard protocol following bolus administration of intravenous contrast.  CONTRAST:  169mL OMNIPAQUE IOHEXOL 300 MG/ML  SOLN  COMPARISON:  12/21/2007  FINDINGS: Dependent atelectasis in the visualized lung bases. Surgical clips in the gallbladder fossa. Unremarkable liver, spleen, adrenal glands, pancreas. Nonspecific low-attenuation subcentimeter bilateral renal lesions stable since prior study probably cysts distended right renal collecting system decompressed ureter suggesting UPJ obstruction, stable without suggestion of unilateral parenchymal loss. No hydronephrosis. Mild scattered aortoiliac atheromatous plaque without aneurysm or stenosis. Portal vein patent. Stomach, small bowel, and colon are nondilated. Normal appendix. Urinary bladder is physiologically distended. Lobular mixed attenuation uterus suggesting multiple fibroids. No ascites. No free air. No adenopathy. 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  IMPRESSION: 1. No acute abdominal process. 2. 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 3. Uterine fibroids. 4. Atheromatous aorta.   Electronically Signed   By: Lucrezia Europe M.D.   On: 10/14/2014 17:24   Dg Abd Acute W/chest  10/14/2014   CLINICAL DATA:  Nausea, vomiting and diarrhea since this morning with chills.  EXAM: DG ABDOMEN ACUTE W/ 1V CHEST  COMPARISON:  Chest x-ray 08/19/2014 and acute abdominal series 12/21/2007  FINDINGS: Lungs are adequately inflated  without consolidation or effusion. Symmetric rounded opacities over the lower lungs compatible with the overlying nipples. Cardiomediastinal silhouette is within normal. Fusion hardware over the cervical thoracic junction unchanged. Remainder of the chest is unchanged.  Small amount of air present within the stomach. Otherwise, there is a relative paucity of bowel gas. No dilated bowel loops. No free peritoneal air. Surgical clips over the right upper quadrant. A few pelvic phleboliths. Mild degenerative change of the spine.  IMPRESSION: Relative paucity of bowel gas which may be due to multiple fluid filled bowel loops as seen with a viral gastroenteritis.  No acute cardiopulmonary disease.   Electronically Signed   By: Marin Olp M.D.   On: 10/14/2014 15:40    Assessment/Plan  59 yo female with uri symptoms, n/v/d, general malaise and significant lactic acidosis  Principal Problem:   Lactic acidosis-  Could be from sepsis or volume depletion.  Ck procalcitionin level.  Cont ivf and to repeat lactic acid level q  3 hours per protocol.    Active Problems:   Subjective Fever-  No fever here, Ct abd /pelvis neg, along with ua and AAS.  Will ck stool cx and cdiff.  Will ck quick flu, if this is positive start tamiflu (symptoms less than one day).  Will hold off on any further abx unless her procalcitonin level comes back elevated, if not would hold off unless her vitals worsen.  obs on tele bed.    Nausea & vomiting- abd exam benign, as above   Malaise- as above   Hypokalemia-  Given 41meq kcl in ED, will cont some in ivf   Diarrhea- as above  chest discomfort -  Serial trop.  ekg reviewed and no acute issues.  Likely related to her acute above illness.  obs on telemetry bed.  Full code.  LOS 1- 2 days.  Champagne Paletta A 10/14/2014, 8:09 PM

## 2014-10-14 NOTE — Progress Notes (Signed)
EDCM spoke to patient at bedside.  Patient confirms her pcp is Dr. Adrian Blackwater at the [facility].  Patient reports she does not have insurance.  Patient reports she is aware of the orange card, but has not been able to schedule an appointment at [facility] with financial counselor. Patient reports she has had a visit with Dr. Adrian Blackwater on Tuesday. Patient reports she has not applied for Medicaid.  Patient confirms she lives at home with her daughter and her mom.  Patient reports she is able to perform  her ADL's without difficulty.  No further EDCM needs at this time.

## 2014-10-14 NOTE — ED Provider Notes (Addendum)
CSN: [id number]     Arrival date & time 10/14/14  1247 History   First MD Initiated Contact with Patient 10/14/14 1255     Chief Complaint  Patient presents with  . NVD/chest pain      (Consider location/radiation/quality/duration/timing/severity/associated sxs/prior Treatment) The history is provided by the patient.   patient presents with nausea vomiting diarrhea that started today. States she's had chills. Decreased appetite. 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 Fevers of up to 103 at home. States she feels horrible. Also some dull chest pain. No dysuria  Past Medical History  Diagnosis Date  . Hypertension    Past Surgical History  Procedure Laterality Date  . Cesarean section    . Cholecystectomy     Family History  Problem Relation Age of Onset  . Hypertension Mother   . Arthritis Mother   . Heart disease Father   . Cancer Neg Hx    History  Substance Use Topics  . Smoking status: Never Smoker   . Smokeless tobacco: Not on file  . Alcohol Use: Yes     Comment: ocassionally   OB History    No data available     Review of Systems  Constitutional: Positive for fever, chills and appetite change. Negative for activity change.  Eyes: Negative for pain.  Respiratory: Negative for chest tightness and shortness of breath.   Cardiovascular: Positive for chest pain. Negative for leg swelling.  Gastrointestinal: Positive for nausea, vomiting, abdominal pain and diarrhea.  Genitourinary: Negative for flank pain.  Musculoskeletal: Positive for myalgias. Negative for back pain and neck stiffness.  Skin: Negative for rash.  Neurological: Negative for weakness, numbness and headaches.  Psychiatric/Behavioral: Negative for behavioral problems.      Allergies  Review of patient's allergies indicates no known allergies.  Home Medications   Prior to Admission medications   Medication Sig Start Date End Date Taking? Authorizing Provider   acetaminophen (TYLENOL) 500 MG tablet Take 1,000 mg by mouth every 6 (six) hours as needed for mild pain or headache.   Yes Historical Provider, MD  aspirin EC 81 MG tablet Take 1 tablet (81 mg total) by mouth daily. 08/21/14  Yes Maryann Mikhail, DO  atorvastatin (LIPITOR) 20 MG tablet Take 1 tablet (20 mg total) by mouth daily after supper. 08/23/14  Yes Micheline Chapman, NP  diclofenac sodium (VOLTAREN) 1 % GEL Apply 2 g topically 4 (four) times daily. Patient taking differently: Apply 2 g topically 4 (four) times daily as needed (pain).  10/06/14  Yes Josalyn Funches, MD  lisinopril-hydrochlorothiazide (PRINZIDE) 10-12.5 MG per tablet Take 2 tablets by mouth at bedtime. 08/21/14  Yes Maryann Mikhail, DO  metoprolol succinate (TOPROL-XL) 50 MG 24 hr tablet Take 1 tablet (50 mg total) by mouth daily. 09/06/14  Yes Fay Records, MD   BP 211/115 mmHg  Pulse 73  Temp(Src) 97.6 F (36.4 C) (Rectal)  Resp 16  Wt 125 lb (56.7 kg)  SpO2 100% Physical Exam  Constitutional: She appears well-developed and well-nourished.  Patient appears uncomfortable  HENT:  Head: Normocephalic.  Eyes: EOM are normal.  Cardiovascular: Normal rate and regular rhythm.   Pulmonary/Chest: Effort normal.  Abdominal: There is tenderness.  Diffuse abdominal tenderness, worse in the upper abdomen.  Neurological: She is alert.  Skin: There is pallor.    ED Course  Procedures (including critical care time) Labs Review Labs Reviewed  COMPREHENSIVE METABOLIC PANEL - Abnormal; Notable for the  following:    Potassium 2.8 (*)    Glucose, Bld 211 (*)    Calcium 10.7 (*)    Total Protein 9.1 (*)    GFR calc non Af Amer 89 (*)    Anion gap 18 (*)    All other components within normal limits  CBC WITH DIFFERENTIAL/PLATELET - Abnormal; Notable for the following:    WBC 23.8 (*)    RBC 5.13 (*)    Neutrophils Relative % 85 (*)    Neutro Abs 20.3 (*)    Lymphocytes Relative 11 (*)    All other components within  normal limits  I-STAT CG4 LACTIC ACID, ED - Abnormal; Notable for the following:    Lactic Acid, Venous 6.25 (*)    All other components within normal limits  CULTURE, BLOOD (ROUTINE X 2)  CULTURE, BLOOD (ROUTINE X 2)  LIPASE, BLOOD  TROPONIN I  URINALYSIS, ROUTINE W REFLEX MICROSCOPIC    Imaging Review Dg Abd Acute W/chest  10/14/2014   CLINICAL DATA:  Nausea, vomiting and diarrhea since this morning with chills.  EXAM: DG ABDOMEN ACUTE W/ 1V CHEST  COMPARISON:  Chest x-ray 08/19/2014 and acute abdominal series 12/21/2007  FINDINGS: Lungs are adequately inflated without consolidation or effusion. Symmetric rounded opacities over the lower lungs compatible with the overlying nipples. Cardiomediastinal silhouette is within normal. Fusion hardware over the cervical thoracic junction unchanged. Remainder of the chest is unchanged.  Small amount of air present within the stomach. Otherwise, there is a relative paucity of bowel gas. No dilated bowel loops. No free peritoneal air. Surgical clips over the right upper quadrant. A few pelvic phleboliths. Mild degenerative change of the spine.  IMPRESSION: Relative paucity of bowel gas which may be due to multiple fluid filled bowel loops as seen with a viral gastroenteritis.  No acute cardiopulmonary disease.   Electronically Signed   By: Marin Olp M.D.   On: 10/14/2014 15:40     EKG Interpretation   Date/Time:  Thursday October 14 2014 12:52:02 EDT Ventricular Rate:  68 PR Interval:  179 QRS Duration: 93 QT Interval:  422 QTC Calculation: 449 R Axis:   -2 Text Interpretation:  Sinus rhythm LVH with secondary repolarization  abnormality Baseline wander in lead(s) V4 T waves now downgoing inferiorly  Confirmed by Alvino Chapel  MD, Ovid Curd [phone]) on 10/14/2014 1:10:41 PM      MDM   Final diagnoses:  Nausea vomiting and diarrhea    Patient with hypothermia and elevated lactate. Nausea vomiting diarrhea. Moderate abdominal pain. White count is  rather elevated. Will get CT scan. Patient feels somewhat better after initial IV fluids.    Davonna Belling, MD 10/14/14 1528  With initial hypothermia and elevated lactic acid was started on code sepsis. Zosyn given due to possible intra-abdominal infection. Patient looks better after IV fluids but discontinued tenderness. White count is moderately elevated and will get CT.  Davonna Belling, MD 10/14/14 [phone]

## 2014-10-14 NOTE — ED Notes (Signed)
Patient transported to CT 

## 2014-10-15 DIAGNOSIS — E876 Hypokalemia: Secondary | ICD-10-CM

## 2014-10-15 DIAGNOSIS — R112 Nausea with vomiting, unspecified: Secondary | ICD-10-CM | POA: Diagnosis present

## 2014-10-15 DIAGNOSIS — R197 Diarrhea, unspecified: Secondary | ICD-10-CM

## 2014-10-15 LAB — BASIC METABOLIC PANEL
Anion gap: 11 (ref 5–15)
BUN: 15 mg/dL (ref 6–23)
CO2: 25 mmol/L (ref 19–32)
Calcium: 8.7 mg/dL (ref 8.4–10.5)
Chloride: 102 mmol/L (ref 96–112)
Creatinine, Ser: 0.69 mg/dL (ref 0.50–1.10)
GFR calc Af Amer: >90 mL/min (ref >90)
GFR calc non Af Amer: >90 mL/min (ref >90)
Glucose, Bld: 146 mg/dL — ABNORMAL HIGH (ref 70–99)
Potassium: 3.3 mmol/L — ABNORMAL LOW (ref 3.5–5.1)
Sodium: 138 mmol/L (ref 135–145)

## 2014-10-15 LAB — INFLUENZA PANEL BY PCR (TYPE A & B)
H1N1 flu by pcr: NOT DETECTED
Influenza A By PCR: NEGATIVE
Influenza B By PCR: NEGATIVE

## 2014-10-15 LAB — TROPONIN I
Troponin I: <0.03 ng/mL (ref <0.031)
Troponin I: <0.03 ng/mL (ref <0.031)

## 2014-10-15 LAB — MAGNESIUM: Magnesium: 1.8 mg/dL (ref 1.5–2.5)

## 2014-10-15 LAB — CBC
HCT: 37 % (ref 36.0–46.0)
Hemoglobin: 12.5 g/dL (ref 12.0–15.0)
MCH: 28.9 pg (ref 26.0–34.0)
MCHC: 33.8 g/dL (ref 30.0–36.0)
MCV: 85.5 fL (ref 78.0–100.0)
Platelets: 286 10*3/uL (ref 150–400)
RBC: 4.33 MIL/uL (ref 3.87–5.11)
RDW: 13.4 % (ref 11.5–15.5)
WBC: 11.9 10*3/uL — ABNORMAL HIGH (ref 4.0–10.5)

## 2014-10-15 LAB — LACTIC ACID, PLASMA: Lactic Acid, Venous: 2 mmol/L (ref 0.5–2.0)

## 2014-10-15 LAB — CLOSTRIDIUM DIFFICILE BY PCR: Toxigenic C. Difficile by PCR: NEGATIVE

## 2014-10-15 MED ORDER — POTASSIUM CHLORIDE CRYS ER 20 MEQ PO TBCR
40.0000 meq | EXTENDED_RELEASE_TABLET | Freq: Once | ORAL | Status: AC
  Administered 2014-10-15: 40 meq via ORAL
  Filled 2014-10-15: qty 2

## 2014-10-15 MED ORDER — LOPERAMIDE HCL 2 MG PO CAPS: 2.0000 mg | ORAL_CAPSULE | Freq: Three times a day (TID) | ORAL | Status: DC | PRN

## 2014-10-15 MED ORDER — LOPERAMIDE HCL 2 MG PO CAPS
4.0000 mg | ORAL_CAPSULE | Freq: Once | ORAL | Status: AC
  Administered 2014-10-15: 4 mg via ORAL
  Filled 2014-10-15: qty 2

## 2014-10-15 MED ORDER — HYDRALAZINE HCL 20 MG/ML IJ SOLN: 10.0000 mg | Freq: Four times a day (QID) | INTRAMUSCULAR | Status: DC | PRN

## 2014-10-15 NOTE — Care Management Note (Signed)
    Page 1 of 1   10/15/2014     1:40:43 PM CARE MANAGEMENT NOTE 10/15/2014  Patient:  [name], [name]   Account Number:  [id number]  Date Initiated:  10/15/2014  Documentation initiated by:  Dessa Phi  Subjective/Objective Assessment:   59 y/o f admitted w/lactic acidosis.     Action/Plan:   From home.   Anticipated DC Date:  10/18/2014   Anticipated DC Plan:  [place]  CM consult  [facility]      Choice offered to / List presented to:             Status of service:  In process, will continue to follow Medicare Important Message given?   (If response is "NO", the following Medicare IM given date fields will be blank) Date Medicare IM given:   Medicare IM given by:   Date Additional Medicare IM given:   Additional Medicare IM given by:    Discharge Disposition:    Per UR Regulation:  Reviewed for med. necessity/level of care/duration of stay  If discussed at [place] of Stay Meetings, dates discussed:    Comments:  10/15/14 Dessa Phi RN BSN NCM [phone] Will contact [place] for pcp appt.Patient can go there for meds.Will give [facility] med list.

## 2014-10-15 NOTE — Progress Notes (Signed)
[facility] PROGRESS NOTE  ETHELINE GEPPERT YWV:[id number] DOB: [birth year] DOA: 10/14/2014  PCP: Minerva Ends, MD  Brief HPI: 59 year old African-American female presented with sudden onset of nausea, vomiting and diarrhea. She was found to have elevated lactic acid level. She was given IV fluids and was admitted to the hospital for further management.  Past medical history:  Past Medical History  Diagnosis Date  . Hypertension     Consultants: none  Procedures: none  Antibiotics: none  Subjective: Patient feels much better this morning. No further nausea and vomiting. Continues to have loose stools. Denies any abdominal pain. Tolerating her clear liquids.  Objective: Vital Signs  Filed Vitals:   10/14/14 1836 10/14/14 1916 10/14/14 2107 10/15/14 0500  BP: 177/104 172/91 151/85   Pulse:  90 93   Temp:   97.8 F (36.6 C)   TempSrc:   Oral   Resp:  16 18   Height:    5\' 2"  (1.575 m)  Weight:    50.712 kg (111 lb 12.8 oz)  SpO2:  93% 100%     Intake/Output Summary (Last 24 hours) at 10/15/14 1016 Last data filed at 10/15/14 0600  Gross per 24 hour  Intake    897 ml  Output      0 ml  Net    897 ml   Filed Weights   10/14/14 1456 10/15/14 0500  Weight: 56.7 kg (125 lb) 50.712 kg (111 lb 12.8 oz)    General appearance: alert, cooperative, appears stated age and no distress Resp: clear to auscultation bilaterally Cardio: regular rate and rhythm, S1, S2 normal, no murmur, click, rub or gallop GI: soft, non-tender; bowel sounds normal; no masses,  no organomegaly Extremities: extremities normal, atraumatic, no cyanosis or edema Neurologic: Alert and oriented X 3, normal strength and tone. No obvious focal deficits.  Lab Results:  Basic Metabolic Panel:  Recent Labs Lab 10/14/14 1318 10/15/14 0405  NA 141 138  K 2.8* 3.3*  CL 101 102  CO2 22 25  GLUCOSE 211* 146*  BUN 20 15  CREATININE 0.77 0.69  CALCIUM 10.7* 8.7   Liver Function  Tests:  Recent Labs Lab 10/14/14 1318  AST 31  ALT 27  ALKPHOS 101  BILITOT 0.5  PROT 9.1*  ALBUMIN 5.2    Recent Labs Lab 10/14/14 1318  LIPASE 20   CBC:  Recent Labs Lab 10/14/14 1318 10/15/14 0405  WBC 23.8* 11.9*  NEUTROABS 20.3*  --   HGB 14.4 12.5  HCT 44.0 37.0  MCV 85.8 85.5  PLT 389 286   Cardiac Enzymes:  Recent Labs Lab 10/14/14 1318 10/14/14 2140 10/15/14 0405  TROPONINI <0.03 <0.03 <0.03     Recent Results (from the past 240 hour(s))  Blood Culture (routine x 2)     Status: None (Preliminary result)   Collection Time: 10/14/14  2:50 PM  Result Value Ref Range Status   Specimen Description   Final    BLOOD RIGHT ARM Performed at [place] Requests   Final    BOTTLES DRAWN AEROBIC AND ANAEROBIC 4CC Performed at [facility]    Culture   Final           BLOOD CULTURE RECEIVED NO GROWTH TO DATE CULTURE WILL BE HELD FOR 5 DAYS BEFORE ISSUING A FINAL NEGATIVE REPORT Performed at [facility]    Report Status PENDING  Incomplete      Studies/Results: Ct Abdomen Pelvis  W Contrast  10/14/2014   CLINICAL DATA:  nausea vomiting diarrhea that started today. States she's had chills. Decreased appetite. 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 Fevers of up to 103 at home. States she feels horrible. Also some dull chest pain. No dysuriaHx Cholecystectomy, HTN  EXAM: CT ABDOMEN AND PELVIS WITH CONTRAST  TECHNIQUE: Multidetector CT imaging of the abdomen and pelvis was performed using the standard protocol following bolus administration of intravenous contrast.  CONTRAST:  130mL OMNIPAQUE IOHEXOL 300 MG/ML  SOLN  COMPARISON:  12/21/2007  FINDINGS: Dependent atelectasis in the visualized lung bases. Surgical clips in the gallbladder fossa. Unremarkable liver, spleen, adrenal glands, pancreas. Nonspecific low-attenuation subcentimeter bilateral renal lesions stable since prior study probably  cysts distended right renal collecting system decompressed ureter suggesting UPJ obstruction, stable without suggestion of unilateral parenchymal loss. No hydronephrosis. Mild scattered aortoiliac atheromatous plaque without aneurysm or stenosis. Portal vein patent. Stomach, small bowel, and colon are nondilated. Normal appendix. Urinary bladder is physiologically distended. Lobular mixed attenuation uterus suggesting multiple fibroids. No ascites. No free air. No adenopathy. 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  IMPRESSION: 1. No acute abdominal process. 2. 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 3. Uterine fibroids. 4. Atheromatous aorta.   Electronically Signed   By: Lucrezia Europe M.D.   On: 10/14/2014 17:24   Dg Abd Acute W/chest  10/14/2014   CLINICAL DATA:  Nausea, vomiting and diarrhea since this morning with chills.  EXAM: DG ABDOMEN ACUTE W/ 1V CHEST  COMPARISON:  Chest x-ray 08/19/2014 and acute abdominal series 12/21/2007  FINDINGS: Lungs are adequately inflated without consolidation or effusion. Symmetric rounded opacities over the lower lungs compatible with the overlying nipples. Cardiomediastinal silhouette is within normal. Fusion hardware over the cervical thoracic junction unchanged. Remainder of the chest is unchanged.  Small amount of air present within the stomach. Otherwise, there is a relative paucity of bowel gas. No dilated bowel loops. No free peritoneal air. Surgical clips over the right upper quadrant. A few pelvic phleboliths. Mild degenerative change of the spine.  IMPRESSION: Relative paucity of bowel gas which may be due to multiple fluid filled bowel loops as seen with a viral gastroenteritis.  No acute cardiopulmonary disease.   Electronically Signed   By: Marin Olp M.D.   On: 10/14/2014 15:40    Medications:  Scheduled: . aspirin EC  81 mg Oral Daily  . metoprolol succinate  50 mg Oral Daily  . sodium chloride  3 mL Intravenous Q12H    Continuous: . 0.9 % NaCl with KCl 20 mEq / L 75 mL/hr at 10/15/14 1159   TGY:[id number] **OR** acetaminophen, alum & mag hydroxide-simeth, HYDROcodone-acetaminophen, ondansetron **OR** ondansetron (ZOFRAN) IV  Assessment/Plan:  Principal Problem:   Lactic acidosis Active Problems:   Fever   Nausea & vomiting   Malaise   Hypokalemia   Diarrhea    Nausea, vomiting and diarrhea Likely acute gastroenteritis. C. Difficile is pending. Patient feels better. Advance diet. Continue IV fluids. Can give Imodium once C. Difficile is ruled out. She tells me that she had family members who had similar complaints recently. CT scan of the abdomen pelvis was reassuring. Continue to hold off on antibiotics.  Lactic acidosis Likely due to hypovolemia. Has been improving. Will repeat.  Hypokalemia This will be repleted. Check magnesium level.  History of essential hypertension Continue beta blocker. Holding the ACE inhibitor and diuretic for now. Blood pressure not very well controlled.  Hydralazine as needed.  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 She had an appointment with neurosurgery today. She has called and cancelled the appointment. Not an active issue currently.  DVT Prophylaxis: SCDs    Code Status: full code  Family Communication: discussed with the patient  Disposition Plan: mobilize. Await C. Difficile.  Follow-up Appointment?: will need to see PCP next week. She apparently has an appointment on Tuesday.     [facility]  [facility] Pager [phone] 10/15/2014, 10:16 AM  If 7PM-7AM, please contact night-coverage at [email or web address], password [facility]

## 2014-10-15 NOTE — Progress Notes (Signed)
UR completed 

## 2014-10-16 LAB — FECAL LACTOFERRIN, QUANT: Fecal Lactoferrin: POSITIVE

## 2014-10-16 LAB — BASIC METABOLIC PANEL
Anion gap: 5 (ref 5–15)
BUN: 11 mg/dL (ref 6–23)
CO2: 26 mmol/L (ref 19–32)
Calcium: 8.4 mg/dL (ref 8.4–10.5)
Chloride: 110 mmol/L (ref 96–112)
Creatinine, Ser: 0.52 mg/dL (ref 0.50–1.10)
GFR calc Af Amer: >90 mL/min (ref >90)
GFR calc non Af Amer: >90 mL/min (ref >90)
Glucose, Bld: 117 mg/dL — ABNORMAL HIGH (ref 70–99)
Potassium: 4 mmol/L (ref 3.5–5.1)
Sodium: 141 mmol/L (ref 135–145)

## 2014-10-16 LAB — CBC
HCT: 32.6 % — ABNORMAL LOW (ref 36.0–46.0)
Hemoglobin: 10.6 g/dL — ABNORMAL LOW (ref 12.0–15.0)
MCH: 28.1 pg (ref 26.0–34.0)
MCHC: 32.5 g/dL (ref 30.0–36.0)
MCV: 86.5 fL (ref 78.0–100.0)
Platelets: 248 10*3/uL (ref 150–400)
RBC: 3.77 MIL/uL — ABNORMAL LOW (ref 3.87–5.11)
RDW: 13.7 % (ref 11.5–15.5)
WBC: 10.2 10*3/uL (ref 4.0–10.5)

## 2014-10-16 LAB — PROCALCITONIN: Procalcitonin: 0.19 ng/mL

## 2014-10-16 NOTE — Discharge Summary (Signed)
[facility]  Physician Discharge Summary   Patient ID: [name] MRN: [id number] DOB/AGE: 09/14/1955 59 y.o.  Admit date: 10/14/2014 Discharge date: 10/16/2014  PCP: Minerva Ends, MD  DISCHARGE DIAGNOSES:  Principal Problem:   Lactic acidosis Active Problems:   Fever   Nausea & vomiting   Malaise   Hypokalemia   Diarrhea   Nausea vomiting and diarrhea   RECOMMENDATIONS FOR OUTPATIENT FOLLOW UP: 1. Patient to follow-up with her primary care physician. She has an appointment 2. Other incidental abnormalities noted on CT abdomen discussed with the patient. Please address at follow-up.   DISCHARGE CONDITION: good  Diet recommendation: Low sodium  Filed Weights   10/14/14 1456 10/15/14 0500 10/16/14 0543  Weight: 56.7 kg (125 lb) 50.712 kg (111 lb 12.8 oz) 50.71 kg (111 lb 12.7 oz)    INITIAL HISTORY: 59 year old African-American female presented with sudden onset of nausea, vomiting and diarrhea. She was found to have elevated lactic acid level. She was given IV fluids and was admitted to the hospital for further management.  Consultations:  None  Procedures:  None  HOSPITAL COURSE:   Nausea, vomiting and diarrhea Symptoms were likely due to acute gastroenteritis. C. difficile was negative. Apparently was exposed to family member with similar complaints. She had a CT of the abdomen as below, without any significant abnormal findings. 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 Renal cysts were also noted. Patient's symptoms quickly improved. She was given Imodium and her diarrhea subsided. She tolerated her diet without any problems. She was ambulating without any difficulties. She was subsequently discharged home.   Lactic acidosis Likely due to hypovolemia. Resolved.  Hypokalemia This was repleted.  History of essential hypertension She may resume her home medications. In the hospital,  her diuretic and ACE inhibitor were held. But renal function has been stable. Okay to resume.   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 She had an appointment with neurosurgery on 4/22. She called and cancelled the appointment. Not an active issue currently. We will need to follow-up at a later date.  Overall much better. She wants to go home. She is stable for discharge.  PERTINENT LABS:  The results of significant diagnostics from this hospitalization (including imaging, microbiology, ancillary and laboratory) are listed below for reference.    Microbiology: Recent Results (from the past 240 hour(s))  Blood Culture (routine x 2)     Status: None (Preliminary result)   Collection Time: 10/14/14  2:25 PM  Result Value Ref Range Status   Specimen Description   Final    BLOOD LEFT HAND Performed at [place] Requests   Final    [place] Performed at [facility]    Culture   Final           BLOOD CULTURE RECEIVED NO GROWTH TO DATE CULTURE WILL BE HELD FOR 5 DAYS BEFORE ISSUING A FINAL NEGATIVE REPORT Performed at [facility]    Report Status PENDING  Incomplete  Blood Culture (routine x 2)     Status: None (Preliminary result)   Collection Time: 10/14/14  2:50 PM  Result Value Ref Range Status   Specimen Description   Final    BLOOD RIGHT ARM Performed at [facility]    Special Requests   Final    BOTTLES DRAWN AEROBIC AND ANAEROBIC 4CC Performed at [facility]    Culture   Final           BLOOD CULTURE RECEIVED NO GROWTH TO DATE CULTURE WILL BE HELD FOR 5 DAYS BEFORE ISSUING A FINAL NEGATIVE REPORT Performed at [facility]    Report Status PENDING  Incomplete  Clostridium Difficile by PCR     Status: None   Collection Time: 10/15/14  1:54 PM  Result Value Ref Range Status   C difficile by pcr NEGATIVE NEGATIVE Final     Labs: Basic Metabolic  Panel:  Recent Labs Lab 10/14/14 1318 10/15/14 0405 10/15/14 1440 10/16/14 0450  NA 141 138  --  141  K 2.8* 3.3*  --  4.0  CL 101 102  --  110  CO2 22 25  --  26  GLUCOSE 211* 146*  --  117*  BUN 20 15  --  11  CREATININE 0.77 0.69  --  0.52  CALCIUM 10.7* 8.7  --  8.4  MG  --   --  1.8  --    Liver Function Tests:  Recent Labs Lab 10/14/14 1318  AST 31  ALT 27  ALKPHOS 101  BILITOT 0.5  PROT 9.1*  ALBUMIN 5.2    Recent Labs Lab 10/14/14 1318  LIPASE 20   CBC:  Recent Labs Lab 10/14/14 1318 10/15/14 0405 10/16/14 0450  WBC 23.8* 11.9* 10.2  NEUTROABS 20.3*  --   --   HGB 14.4 12.5 10.6*  HCT 44.0 37.0 32.6*  MCV 85.8 85.5 86.5  PLT 389 286 248   Cardiac Enzymes:  Recent Labs Lab 10/14/14 1318 10/14/14 2140 10/15/14 0405 10/15/14 0930  TROPONINI <0.03 <0.03 <0.03 <0.03    IMAGING STUDIES Ct Abdomen Pelvis W Contrast  10/14/2014   CLINICAL DATA:  nausea vomiting diarrhea that started today. States she's had chills. Decreased appetite. 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 Fevers of up to 103 at home. States she feels horrible. Also some dull chest pain. No dysuriaHx Cholecystectomy, HTN  EXAM: CT ABDOMEN AND PELVIS WITH CONTRAST  TECHNIQUE: Multidetector CT imaging of the abdomen and pelvis was performed using the standard protocol following bolus administration of intravenous contrast.  CONTRAST:  126mL OMNIPAQUE IOHEXOL 300 MG/ML  SOLN  COMPARISON:  12/21/2007  FINDINGS: Dependent atelectasis in the visualized lung bases. Surgical clips in the gallbladder fossa. Unremarkable liver, spleen, adrenal glands, pancreas. Nonspecific low-attenuation subcentimeter bilateral renal lesions stable since prior study probably cysts distended right renal collecting system decompressed ureter suggesting UPJ obstruction, stable without suggestion of unilateral parenchymal loss. No hydronephrosis. Mild scattered aortoiliac atheromatous  plaque without aneurysm or stenosis. Portal vein patent. Stomach, small bowel, and colon are nondilated. Normal appendix. Urinary bladder is physiologically distended. Lobular mixed attenuation uterus suggesting multiple fibroids. No ascites. No free air. No adenopathy. 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  IMPRESSION: 1. No acute abdominal process. 2. 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 3. Uterine fibroids. 4. Atheromatous aorta.   Electronically Signed   By: Lucrezia Europe M.D.   On: 10/14/2014 17:24   Dg Abd Acute W/chest  10/14/2014   CLINICAL DATA:  Nausea, vomiting and diarrhea since this morning with chills.  EXAM: DG ABDOMEN ACUTE W/ 1V CHEST  COMPARISON:  Chest x-ray 08/19/2014 and acute abdominal series 12/21/2007  FINDINGS: Lungs are adequately inflated without consolidation or effusion. Symmetric rounded opacities over the lower lungs compatible with the overlying nipples. Cardiomediastinal silhouette  is within normal. Fusion hardware over the cervical thoracic junction unchanged. Remainder of the chest is unchanged.  Small amount of air present within the stomach. Otherwise, there is a relative paucity of bowel gas. No dilated bowel loops. No free peritoneal air. Surgical clips over the right upper quadrant. A few pelvic phleboliths. Mild degenerative change of the spine.  IMPRESSION: Relative paucity of bowel gas which may be due to multiple fluid filled bowel loops as seen with a viral gastroenteritis.  No acute cardiopulmonary disease.   Electronically Signed   By: Marin Olp M.D.   On: 10/14/2014 15:40    DISCHARGE EXAMINATION: Filed Vitals:   10/15/14 1500 10/15/14 2134 10/16/14 0543 10/16/14 0947  BP: 149/74 160/85 165/97 147/80  Pulse: 78 75 72 65  Temp: 97.9 F (36.6 C) 98.1 F (36.7 C) 98.7 F (37.1 C)   TempSrc: Oral Oral Oral   Resp: 18 18 18    Height:      Weight:   50.71 kg (111 lb 12.7 oz)   SpO2: 100% 100% 100%    General  appearance: alert, cooperative, appears stated age and no distress Resp: clear to auscultation bilaterally Cardio: regular rate and rhythm, S1, S2 normal, no murmur, click, rub or gallop GI: soft, non-tender; bowel sounds normal; no masses,  no organomegaly Extremities: extremities normal, atraumatic, no cyanosis or edema  DISPOSITION: Home  Discharge Instructions    Call MD for:  extreme fatigue    Complete by:  As directed      Call MD for:  persistant dizziness or light-headedness    Complete by:  As directed      Call MD for:  persistant nausea and vomiting    Complete by:  As directed      Call MD for:  severe uncontrolled pain    Complete by:  As directed      Call MD for:  temperature >100.4    Complete by:  As directed      Diet - low sodium heart healthy    Complete by:  As directed      Discharge instructions    Complete by:  As directed   Please take over the counter imodium if diarrhea recurs. Stay well hydrated. See your PCP in 7-10 days.  You were cared for by a hospitalist during your hospital stay. If you have any questions about your discharge medications or the care you received while you were in the hospital after you are discharged, you can call the unit and asked to speak with the hospitalist on call if the hospitalist that took care of you is not available. Once you are discharged, your primary care physician will handle any further medical issues. Please note that NO REFILLS for any discharge medications will be authorized once you are discharged, as it is imperative that you return to your primary care physician (or establish a relationship with a primary care physician if you do not have one) for your aftercare needs so that they can reassess your need for medications and monitor your lab values. If you do not have a primary care physician, you can call [phone] for a physician referral.     Increase activity slowly    Complete by:  As directed             ALLERGIES: No Known Allergies   Discharge Medication List as of 10/16/2014  9:40 AM    CONTINUE these medications which have NOT CHANGED   Details  acetaminophen (TYLENOL) 500 MG tablet Take 1,000 mg by mouth every 6 (six) hours as needed for mild pain or headache., Until Discontinued, Historical Med    aspirin EC 81 MG tablet Take 1 tablet (81 mg total) by mouth daily., Starting 08/21/2014, Until Discontinued, No Print    atorvastatin (LIPITOR) 20 MG tablet Take 1 tablet (20 mg total) by mouth daily after supper., Starting 08/23/2014, Until Discontinued, Normal    diclofenac sodium (VOLTAREN) 1 % GEL Apply 2 g topically 4 (four) times daily., Starting 10/06/2014, Until Discontinued, Normal    lisinopril-hydrochlorothiazide (PRINZIDE) 10-12.5 MG per tablet Take 2 tablets by mouth at bedtime., Starting 08/21/2014, Until Discontinued, Print    metoprolol succinate (TOPROL-XL) 50 MG 24 hr tablet Take 1 tablet (50 mg total) by mouth daily., Starting 09/06/2014, Until Discontinued, Normal       Follow-up Information    Follow up with [facility]     On 10/20/2014.   Why:  9a/Dr. Amao/bring photo id/meds in bottle.   Contact information:   [place] [id number] [phone]      TOTAL DISCHARGE TIME: 17 minutes  [facility] Pager [phone]  10/16/2014, 12:42 PM

## 2014-10-16 NOTE — Progress Notes (Signed)
Pt discharged to home. DC instructions given. No concerns voiced. Left unit in wheelchair pushed by nurse tech. Left in good condition. Vwilliams,rn.

## 2014-10-16 NOTE — Progress Notes (Signed)
NCM spoke to pt and she goes to the [facility] for follow up. She will contact Clinic to arrange an appt for next week. Pt states she has her Medications at home. Jonnie Finner RN CCM Case Mgmt phone [phone]

## 2014-10-20 ENCOUNTER — Inpatient Hospital Stay: Payer: Self-pay | Admitting: Family Medicine

## 2014-10-20 LAB — CULTURE, BLOOD (ROUTINE X 2): Culture: NO GROWTH

## 2014-10-21 LAB — CULTURE, BLOOD (ROUTINE X 2): Culture: NO GROWTH

## 2014-10-25 ENCOUNTER — Encounter: Payer: Self-pay | Admitting: Family Medicine

## 2014-10-25 ENCOUNTER — Other Ambulatory Visit: Payer: Self-pay

## 2014-10-25 ENCOUNTER — Ambulatory Visit: Payer: Self-pay | Attending: Family Medicine | Admitting: Family Medicine

## 2014-10-25 ENCOUNTER — Ambulatory Visit (HOSPITAL_COMMUNITY)
Admission: RE | Admit: 2014-10-25 | Discharge: 2014-10-25 | Disposition: A | Discharge status: Home / Self Care | Payer: Self-pay | Source: Ambulatory Visit | Attending: Cardiology | Admitting: Cardiology

## 2014-10-25 VITALS — BP 146/90 | HR 75 | Temp 97.9°F | Resp 18 | Ht 62.0 in | Wt 114.0 lb

## 2014-10-25 DIAGNOSIS — K2970 Gastritis, unspecified, without bleeding: Secondary | ICD-10-CM | POA: Insufficient documentation

## 2014-10-25 DIAGNOSIS — R0789 Other chest pain: Secondary | ICD-10-CM

## 2014-10-25 DIAGNOSIS — E785 Hyperlipidemia, unspecified: Secondary | ICD-10-CM | POA: Insufficient documentation

## 2014-10-25 DIAGNOSIS — M545 Low back pain, unspecified: Secondary | ICD-10-CM

## 2014-10-25 DIAGNOSIS — R079 Chest pain, unspecified: Secondary | ICD-10-CM | POA: Insufficient documentation

## 2014-10-25 DIAGNOSIS — Z7982 Long term (current) use of aspirin: Secondary | ICD-10-CM | POA: Insufficient documentation

## 2014-10-25 DIAGNOSIS — I10 Essential (primary) hypertension: Secondary | ICD-10-CM | POA: Insufficient documentation

## 2014-10-25 DIAGNOSIS — R9431 Abnormal electrocardiogram [ECG] [EKG]: Secondary | ICD-10-CM | POA: Insufficient documentation

## 2014-10-25 DIAGNOSIS — D259 Leiomyoma of uterus, unspecified: Secondary | ICD-10-CM | POA: Insufficient documentation

## 2014-10-25 DIAGNOSIS — R110 Nausea: Secondary | ICD-10-CM | POA: Insufficient documentation

## 2014-10-25 DIAGNOSIS — Z9049 Acquired absence of other specified parts of digestive tract: Secondary | ICD-10-CM | POA: Insufficient documentation

## 2014-10-25 DIAGNOSIS — M546 Pain in thoracic spine: Secondary | ICD-10-CM | POA: Insufficient documentation

## 2014-10-25 DIAGNOSIS — K2990 Gastroduodenitis, unspecified, without bleeding: Secondary | ICD-10-CM

## 2014-10-25 LAB — POCT URINALYSIS DIPSTICK
Bilirubin, UA: NEGATIVE
Glucose, UA: NEGATIVE
Ketones, UA: NEGATIVE
Nitrite, UA: NEGATIVE
Protein, UA: NEGATIVE
Spec Grav, UA: 1.01
Urobilinogen, UA: 0.2
pH, UA: 7

## 2014-10-25 MED ORDER — ATORVASTATIN CALCIUM 20 MG PO TABS: 20.0000 mg | ORAL_TABLET | ORAL | Status: DC

## 2014-10-25 MED ORDER — LISINOPRIL-HYDROCHLOROTHIAZIDE 10-12.5 MG PO TABS: 2.0000 | ORAL_TABLET | Freq: Every day | ORAL | Status: DC

## 2014-10-25 MED ORDER — METOPROLOL SUCCINATE ER 50 MG PO TB24: 50.0000 mg | ORAL_TABLET | Freq: Every day | ORAL | Status: DC

## 2014-10-25 MED ORDER — PANTOPRAZOLE SODIUM 40 MG PO TBEC: 40.0000 mg | DELAYED_RELEASE_TABLET | Freq: Every day | ORAL | Status: DC

## 2014-10-25 NOTE — Progress Notes (Signed)
Patient hospitalized, Thursday-Saturday, about 1.5 weeks ago for stomach bug. Patient reports not feeling like herself since getting out of the hospital. Patient feels weak. Lower back pain at level 5, described as dull, achy pain. "They said something about my kidneys when I was in there, one Dr. Michela Pitcher something about a kidney stone, and then when I got up on the floor another one said something about a cyst on my kidney". Patient reports being nauseous with meals, which has been happening for a long time, but has no vomiting. Patient reports having chest pain since being discharged from hospital, no chest pain currently.

## 2014-10-25 NOTE — Progress Notes (Signed)
Quick Note:  Labs addressed at office as well as medications and patient was made aware. ______ 

## 2014-10-25 NOTE — Patient Instructions (Signed)

## 2014-10-25 NOTE — Progress Notes (Signed)
Subjective:    Patient ID: [name], female    DOB: November 30, 1955, 59 y.o.   MRN: [id number]  HPI  Admit date:  10/14/14 Discharge date: 10/16/14    [name] is a 59 year old female with a history of hypertension, hyperlipidemia who had presented to [facility] ED with sudden onset of nausea, vomiting and diarrhea. She was found to have elevated lactic acid level of 6.25. She was given IV fluids and was admitted to the hospital for further management.  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 Stool for C. difficile was negative, blood culture was also negative. She was placed on Imodium and diarrhea improved; her lactic acidosis was thought to be secondary to lactic acidosis and it trended down to 2.0 at the time of discharge.  Interval History: States since she has been nauseated and this causes anorexia and she has had this for a while; she denies vomiting or diarrhea; admits to late night meals. Has parasternal chest pain sometimes and is unrelated to activity. Back also hurts in the thoracic area and is intermittent, current episode started today  Compliant with antihypertensive and statin for hyperlipidemia and is needing refill of meds  Past Medical History  Diagnosis Date  . Hypertension     Past Surgical History  Procedure Laterality Date  . Cesarean section    . Cholecystectomy      History   Social History  . Marital Status: Widowed    Spouse Name: N/A  . Number of Children: N/A  . Years of Education: N/A   Occupational History  . Not on file.   Social History Main Topics  . Smoking status: Never Smoker   . Smokeless tobacco: Never Used  . Alcohol Use: Yes     Comment: ocassionally  . Drug Use: No  . Sexual Activity: No   Other Topics Concern  . 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 Hypertension Mother   . Arthritis Mother   . Heart disease Father   . Cancer Neg Hx     No Known Allergies  Current Outpatient Prescriptions on File Prior to Visit  Medication Sig Dispense Refill  . acetaminophen (TYLENOL) 500 MG tablet Take 1,000 mg by mouth every 6 (six) hours as needed for mild pain or headache.    [name] aspirin EC 81 MG tablet Take 1 tablet (81 mg total) by mouth daily.     No current facility-administered medications on file prior to visit.    Review of Systems  Constitutional: Positive for appetite change. Negative for activity change and fatigue.  HENT: Negative for congestion, sinus pressure and sore throat.   Eyes: Negative for visual disturbance.  Respiratory: Negative for cough, chest tightness, shortness of breath and wheezing.   Cardiovascular: Positive for chest pain. Negative for palpitations.  Gastrointestinal: Positive for nausea. Negative for abdominal pain, constipation and abdominal distention.  Endocrine: Negative for polydipsia.  Genitourinary: Negative for dysuria and frequency.  Musculoskeletal: Positive for back pain. Negative for arthralgias.  Skin: Negative for rash.  Neurological: Negative for tremors, light-headedness and numbness.  Hematological: Does not bruise/bleed easily.  Psychiatric/Behavioral:  Negative for behavioral problems and agitation.          Objective: Filed Vitals:   10/25/14 1436  BP: 146/90  Pulse: 75  Temp: 97.9 F (36.6 C)  Resp: 18      Physical Exam  Constitutional: She is oriented to person, place, and time. She appears well-developed and well-nourished. No distress.  HENT:  Head: Normocephalic.  Right Ear: External ear normal.  Left Ear: External ear normal.  Nose: Nose normal.  Mouth/Throat:  Oropharynx is clear and moist.  Eyes: Conjunctivae and EOM are normal. Pupils are equal, round, and reactive to light.  Neck: Normal range of motion. No JVD present.  Cardiovascular: Normal rate, regular rhythm, normal heart sounds and intact distal pulses.  Exam reveals no gallop.   No murmur heard. Pulmonary/Chest: Effort normal and breath sounds normal. No respiratory distress. She has no wheezes. She has no rales. She exhibits no tenderness.  Abdominal: Soft. Bowel sounds are normal. She exhibits no distension and no mass. There is tenderness.  Epigastric and periumbilical tenderness  Musculoskeletal: Normal range of motion. She exhibits no edema or tenderness.  Neurological: She is alert and oriented to person, place, and time. She has normal reflexes.  Skin: Skin is warm and dry. She is not diaphoretic.  Psychiatric: She has a normal mood and affect.     EXAM: CT ABDOMEN AND PELVIS WITH CONTRAST  TECHNIQUE: Multidetector CT imaging of the abdomen and pelvis was performed using the standard protocol following bolus administration of intravenous contrast.  CONTRAST: 111mL OMNIPAQUE IOHEXOL 300 MG/ML SOLN  COMPARISON: 12/21/2007  FINDINGS: Dependent atelectasis in the visualized lung bases. Surgical clips in the gallbladder fossa. Unremarkable liver, spleen, adrenal glands, pancreas. Nonspecific low-attenuation subcentimeter bilateral renal lesions stable since prior study probably cysts distended right renal collecting system decompressed ureter suggesting UPJ obstruction, stable without suggestion of unilateral parenchymal loss. No hydronephrosis. Mild scattered aortoiliac atheromatous plaque without aneurysm or stenosis. Portal vein patent. Stomach, small bowel, and colon are nondilated. Normal appendix. Urinary bladder is physiologically distended. Lobular mixed attenuation uterus suggesting multiple fibroids. No ascites. No free air. No adenopathy. 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  IMPRESSION: 1. No acute abdominal process. 2. 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 3. Uterine fibroids. 4. Atheromatous aorta.   Electronically Signed  By: Lucrezia Europe M.D.  On: 10/14/2014 17:24     Assessment & Plan:  59 year old female with a history of hypertension, hyperlipidemia recently hospitalized for gastroenteritis which has now resolved but she continues to feel unwell with symptoms of nausea and back pain. Of note she is not up-to-date on her health care maintenance.  Gastritis: Her symptoms of nausea could explain this and so I am placing her on Protonix empirically and if symptoms do not resolve she would need to be tested for H.pylori. Advised to avoid late meals. She will need a colonoscpy.  Chest pain: I will proceed with an EKG to exclude cardiac issue; GI etiology is also a possibility. EKG reveals NSR at a rate of 70bpm, no ST chnages.  Hypertension: Blood pressure is slightly above goal of less than 140/90 but I will make no changes at this time and have recommended lifestyle changes.  Thoracic back pain: UAs negative. Renal etiology unlikely as cysts noticed on CT abdomen have remained stable. Musculoskeletal etiology is highly likely and so I have advised her to use heating pads; she is reluctant to trying muscle relaxant due to the sedating side effects.  Hyperlipidemia: Continue statin.   Advised to come in to establish care; will need a physical including PAP smear, mammogram and colonoscopy.   Disclaimer: This note was dictated with voice recognition software. Similar sounding words can inadvertently be transcribed and this note may contain transcription errors which may not have been corrected upon publication of note.

## 2014-11-08 ENCOUNTER — Encounter: Payer: Self-pay | Admitting: Family Medicine

## 2014-11-08 ENCOUNTER — Ambulatory Visit: Payer: Self-pay | Attending: Family Medicine | Admitting: Family Medicine

## 2014-11-08 VITALS — BP 136/89 | HR 87 | Temp 98.7°F | Resp 16 | Ht 62.0 in | Wt 115.0 lb

## 2014-11-08 DIAGNOSIS — Z0000 Encounter for general adult medical examination without abnormal findings: Secondary | ICD-10-CM

## 2014-11-08 DIAGNOSIS — K2970 Gastritis, unspecified, without bleeding: Secondary | ICD-10-CM

## 2014-11-08 DIAGNOSIS — I10 Essential (primary) hypertension: Secondary | ICD-10-CM

## 2014-11-08 DIAGNOSIS — Z7982 Long term (current) use of aspirin: Secondary | ICD-10-CM | POA: Insufficient documentation

## 2014-11-08 DIAGNOSIS — K529 Noninfective gastroenteritis and colitis, unspecified: Secondary | ICD-10-CM | POA: Insufficient documentation

## 2014-11-08 NOTE — Assessment & Plan Note (Signed)
A: Gastritis in setting of viral GI illness, resolved P: Continue protonix as needed

## 2014-11-08 NOTE — Patient Instructions (Signed)
Ms. Balcom,  Thank you for coming in today.  1. HTN:  BP is at goal continue regimen  2. Low back pain: resolved  3. Gastritis: no upper abdominal pain. Continue protonix as needed   F/u in 4-6 weeks for pap   Dr. Adrian Blackwater

## 2014-11-08 NOTE — Assessment & Plan Note (Addendum)
Mammogram ordered GI referral for screening colonoscopy

## 2014-11-08 NOTE — Progress Notes (Signed)
   Subjective:    Patient ID: [name], female    DOB: 1956/04/05, 59 y.o.   MRN: [id number] CC: f/u HTN, low back pain, gastritis  HPI  1. HTN: compliant with medication regimen. No cough.   2. Back pain: resolved  3. Abdominal pain: resolved. Taking protonix prn only.   Soc Hx: non  smoker   Review of Systems  Constitutional: Negative for fever and chills.  Gastrointestinal: Negative for nausea and abdominal pain.  Musculoskeletal: Negative for back pain.      Objective:   Physical Exam BP 136/89 mmHg  Pulse 87  Temp(Src) 98.7 F (37.1 C) (Oral)  Resp 16  Ht 5\' 2"  (1.575 m)  Wt 115 lb (52.164 kg)  BMI 21.03 kg/m2  SpO2 97% General appearance: alert, cooperative and no distress Back: symmetric, no curvature. ROM normal. No CVA tenderness. Lungs: normal WOB        Assessment & Plan:

## 2014-11-08 NOTE — Assessment & Plan Note (Signed)
HTN: BP is at goal Med: compliant P: continue regimen

## 2014-11-08 NOTE — Progress Notes (Signed)
F/U Stomach problems No problems at this time  No Hx Tobacco

## 2014-12-02 ENCOUNTER — Ambulatory Visit: Payer: Self-pay

## 2014-12-04 NOTE — Op Note (Signed)
DIAGNOSTIC CEREBRAL ANGIOGRAM    OPERATOR:   Dr. [name], MD  HISTORY:   The patient is a 59 y.o. 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  The patient now presents for further workup with diagnostic cerebral angiogram.  APPROACH:   The technical aspects of the procedure as well as its potential risks and benefits were reviewed with the patient. These risks included but were not limited bleeding, infection, allergic reaction, damage to organs/vital structures, stroke, non-diagnostic procedure, and the catastrophic outcomes of heart attack, coma, and death. With an understanding of these risks, informed consent was obtained and witnessed.    The patient was placed in the supine position on the angiography table and the skin of right groin prepped in the usual sterile fashion. The procedure was performed under local anesthesia (1%-solution of bicarbonate-bufferred Lidoacaine) and conscious sedation with Versed and fentanyl monitored by the in-suite nurse.    A 5- French sheath was introduced in the right common femoral artery using Seldinger technique.  A fluorophase sequence was used to document the sheath position.    HEPARIN: 2000 Units total.   CONTRAST AGENT: 100cc FLUOROSCOPY TIME: 6.8 combined AP and lateral minutes   CATHETER(S) AND WIRE(S):    5-French JB-1 glidecatheter   0.035" glidewire    VESSELS CATHETERIZED:   Right common carotid   Left internal carotid   Left vertebral   Right common femoral  VESSELS STUDIED:   Aortic arch: LAO Right common carotid: neck: AP and lateral   Right common carotid: head: AP, lateral, obliques   Left internal carotid: head: AP, lateral, obliques   Left vertebral: AP, lateral   Right femoral: RAO  PROCEDURAL NARRATIVE:   A 5-Fr JB-1 terumo glide catheter was then advanced over a 0.035 glidewire into the aortic arch and the innominate and  right common carotid artery were selected. Cervical and cerebral angiography were performed. The JB-1 was then withdrawn into the aortic arch and the left internal carotid artery was selected. Cervical and cerebral angiography were performed. The JB-1 catheter was then withdrawn into the aortic arch and the left subclavian artery was selected followed by the left vertebral artery. Cerebral angiography was performed. The JB-1 catheter was removed without incident.    INTERPRETATION:   Aortic arch:    Type II, normal three vessel arch configuration. No significant ostial stenosis.   Right common carotid: neck:   There is no significant stenosis, occlusion, aneurysm or plaque visualized on this injection.    Right common carotid: head:   Injection reveals the presence of a widely patent ICA, M1, and A1 segments and their branches. There is no significant stenosis, occlusion, aneurysm or high flow vascular malformation visualized.  The parenchymal and venous phases are normal. The venous sinuses are widely patent.    Left internal carotid: head:   Injection reveals the presence of a widely patent ICA, A1, and M1 segments and their branches. There is a small wide-based aneurysm arising adjacent to the ophthalmic artery.  This aneurysm measures approximately 2.5 mm in maximal dimension, and projects medially. The parenchymal and venous phases are normal. The venous sinuses are widely patent.    Left internal carotid, 3-dimensional rotational angiogram:  3-dimensional rotational angiogram was taken, and  Images were reconstructed on an independent workstation.  These again demonstrate and further delineate the above-described medially projecting periophthalmic internal carotid artery aneurysm.  Left vertebral:   Injection reveals  the presence of a widely patent vertebral artery. This leads to a widely patent basilar artery that terminates in bilateral P1. The basilar apex is normal. There is no  significant stenosis, occlusion, aneurysm, or vascular malformation visualized. The parenchymal and venous phases are normal. The venous sinuses are widely patent.    Right vertebral:    Normal vessel. No PICA aneurysm. See basilar description above.    Right femoral:    Normal vessel. No significant atherosclerotic disease. Arterial sheath in adequate position.   DISPOSITION:  Upon completion of the study, the femoral sheath was removed and hemostasis obtained using a 5-Fr ExoSeal closure device. Good proximal and distal lower extremity pulses were documented upon achievement of hemostasis.    The procedure was well tolerated and no early complications were observed.       The patient was transferred back to the holding area to be positioned flat in bed for 3 hours of observation.    IMPRESSION:  1. 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  The preliminary results of this procedure were shared with the patient and the patient's family.

## 2014-12-16 ENCOUNTER — Ambulatory Visit: Payer: Self-pay | Admitting: Family Medicine

## 2014-12-31 ENCOUNTER — Ambulatory Visit: Payer: Self-pay

## 2015-01-20 ENCOUNTER — Encounter: Payer: Self-pay | Admitting: Family Medicine

## 2015-01-20 ENCOUNTER — Ambulatory Visit: Payer: Self-pay | Attending: Family Medicine | Admitting: Family Medicine

## 2015-01-20 VITALS — BP 154/98 | HR 76 | Temp 98.6°F | Resp 16 | Ht 62.0 in | Wt 119.0 lb

## 2015-01-20 DIAGNOSIS — S93402A Sprain of unspecified ligament of left ankle, initial encounter: Secondary | ICD-10-CM

## 2015-01-20 DIAGNOSIS — I10 Essential (primary) hypertension: Secondary | ICD-10-CM

## 2015-01-20 NOTE — Assessment & Plan Note (Signed)
L ankle sprain from twisting injury.  No evidence of fracture Recommend Rest  Ice  Elevation Compression with ace bandage Ibuprofen 600 mg 2-3 times a day for pain for the next 3-5 days Work on exercises below

## 2015-01-20 NOTE — Patient Instructions (Signed)
Ms. Veiga,  Thank you for coming in today  1. HTN: BP a bit elevated today but you are having pain and BP at goal in recent past Continue current medication regimen   2. L ankle sprain from twisting injury.  No evidence of fracture Recommend Rest  Ice  Elevation Compression with ace bandage Ibuprofen 600 mg 2-3 times a day for pain for the next 3-5 days Work on exercises below   F/u in 3-4 weeks for pap smear  Dr. Adrian Blackwater   Acute Ankle Sprain with Phase I Rehab An acute ankle sprain is a partial or complete tear in one or more of the ligaments of the ankle due to traumatic injury. The severity of the injury depends on both the number of ligaments sprained and the grade of sprain. There are 3 grades of sprains.   A grade 1 sprain is a mild sprain. There is a slight pull without obvious tearing. There is no loss of strength, and the muscle and ligament are the correct length.  A grade 2 sprain is a moderate sprain. There is tearing of fibers within the substance of the ligament where it connects two bones or two cartilages. The length of the ligament is increased, and there is usually decreased strength.  A grade 3 sprain is a complete rupture of the ligament and is uncommon. In addition to the grade of sprain, there are three types of ankle sprains.  Lateral ankle sprains: This is a sprain of one or more of the three ligaments on the outer side (lateral) of the ankle. These are the most common sprains. Medial ankle sprains: There is one large triangular ligament of the inner side (medial) of the ankle that is susceptible to injury. Medial ankle sprains are less common. Syndesmosis, "high ankle," sprains: The syndesmosis is the ligament that connects the two bones of the lower leg. Syndesmosis sprains usually only occur with very severe ankle sprains. SYMPTOMS  Pain, tenderness, and swelling in the ankle, starting at the side of injury that may progress to the whole ankle and  foot with time.  "Pop" or tearing sensation at the time of injury.  Bruising that may spread to the heel.  Impaired ability to walk soon after injury. CAUSES   Acute ankle sprains are caused by trauma placed on the ankle that temporarily forces or pries the anklebone (talus) out of its normal socket.  Stretching or tearing of the ligaments that normally hold the joint in place (usually due to a twisting injury). RISK INCREASES WITH:  Previous ankle sprain.  Sports in which the foot may land awkwardly (i.e., basketball, volleyball, or soccer) or walking or running on uneven or rough surfaces.  Shoes with inadequate support to prevent sideways motion when stress occurs.  Poor strength and flexibility.  Poor balance skills.  Contact sports. PREVENTION   Warm up and stretch properly before activity.  Maintain physical fitness:  Ankle and leg flexibility, muscle strength, and endurance.  Cardiovascular fitness.  Balance training activities.  Use proper technique and have a coach correct improper technique.  Taping, protective strapping, bracing, or high-top tennis shoes may help prevent injury. Initially, tape is best; however, it loses most of its support function within 10 to 15 minutes.  Wear proper-fitted protective shoes (High-top shoes with taping or bracing is more effective than either alone).  Provide the ankle with support during sports and practice activities for 12 months following injury. PROGNOSIS   If treated properly, ankle sprains can  be expected to recover completely; however, the length of recovery depends on the degree of injury.  A grade 1 sprain usually heals enough in 5 to 7 days to allow modified activity and requires an average of 6 weeks to heal completely.  A grade 2 sprain requires 6 to 10 weeks to heal completely.  A grade 3 sprain requires 12 to 16 weeks to heal.  A syndesmosis sprain often takes more than 3 months to heal. RELATED  COMPLICATIONS   Frequent recurrence of symptoms may result in a chronic problem. Appropriately addressing the problem the first time decreases the frequency of recurrence and optimizes healing time. Severity of the initial sprain does not predict the likelihood of later instability.  Injury to other structures (bone, cartilage, or tendon).  A chronically unstable or arthritic ankle joint is a possibility with repeated sprains. TREATMENT Treatment initially involves the use of ice, medication, and compression bandages to help reduce pain and inflammation. Ankle sprains are usually immobilized in a walking cast or boot to allow for healing. Crutches may be recommended to reduce pressure on the injury. After immobilization, strengthening and stretching exercises may be necessary to regain strength and a full range of motion. Surgery is rarely needed to treat ankle sprains. MEDICATION   Nonsteroidal anti-inflammatory medications, such as aspirin and ibuprofen (do not take for the first 3 days after injury or within 7 days before surgery), or other minor pain relievers, such as acetaminophen, are often recommended. Take these as directed by your caregiver. Contact your caregiver immediately if any bleeding, stomach upset, or signs of an allergic reaction occur from these medications.  Ointments applied to the skin may be helpful.  Pain relievers may be prescribed as necessary by your caregiver. Do not take prescription pain medication for longer than 4 to 7 days. Use only as directed and only as much as you need. HEAT AND COLD  Cold treatment (icing) is used to relieve pain and reduce inflammation for acute and chronic cases. Cold should be applied for 10 to 15 minutes every 2 to 3 hours for inflammation and pain and immediately after any activity that aggravates your symptoms. Use ice packs or an ice massage.  Heat treatment may be used before performing stretching and strengthening activities  prescribed by your caregiver. Use a heat pack or a warm soak. SEEK IMMEDIATE MEDICAL CARE IF:   Pain, swelling, or bruising worsens despite treatment.  You experience pain, numbness, discoloration, or coldness in the foot or toes.  New, unexplained symptoms develop (drugs used in treatment may produce side effects.) EXERCISES  PHASE I EXERCISES RANGE OF MOTION (ROM) AND STRETCHING EXERCISES - Ankle Sprain, Acute Phase I, Weeks 1 to 2 These exercises may help you when beginning to restore flexibility in your ankle. You will likely work on these exercises for the 1 to 2 weeks after your injury. Once your physician, physical therapist, or athletic trainer sees adequate progress, he or she will advance your exercises. While completing these exercises, remember:   Restoring tissue flexibility helps normal motion to return to the joints. This allows healthier, less painful movement and activity.  An effective stretch should be held for at least 30 seconds.  A stretch should never be painful. You should only feel a gentle lengthening or release in the stretched tissue. RANGE OF MOTION - Dorsi/Plantar Flexion  While sitting with your right / left knee straight, draw the top of your foot upwards by flexing your ankle. Then reverse the  motion, pointing your toes downward.  Hold each position for __________ seconds.  After completing your first set of exercises, repeat this exercise with your knee bent. Repeat __________ times. Complete this exercise __________ times per day.  RANGE OF MOTION - Ankle Alphabet  Imagine your right / left big toe is a pen.  Keeping your hip and knee still, write out the entire alphabet with your "pen." Make the letters as large as you can without increasing any discomfort. Repeat __________ times. Complete this exercise __________ times per day.  STRENGTHENING EXERCISES - Ankle Sprain, Acute -Phase I, Weeks 1 to 2 These exercises may help you when beginning to  restore strength in your ankle. You will likely work on these exercises for 1 to 2 weeks after your injury. Once your physician, physical therapist, or athletic trainer sees adequate progress, he or she will advance your exercises. While completing these exercises, remember:   Muscles can gain both the endurance and the strength needed for everyday activities through controlled exercises.  Complete these exercises as instructed by your physician, physical therapist, or athletic trainer. Progress the resistance and repetitions only as guided.  You may experience muscle soreness or fatigue, but the pain or discomfort you are trying to eliminate should never worsen during these exercises. If this pain does worsen, stop and make certain you are following the directions exactly. If the pain is still present after adjustments, discontinue the exercise until you can discuss the trouble with your clinician. STRENGTH - Dorsiflexors  Secure a rubber exercise band/tubing to a fixed object (i.e., table, pole) and loop the other end around your right / left foot.  Sit on the floor facing the fixed object. The band/tubing should be slightly tense when your foot is relaxed.  Slowly draw your foot back toward you using your ankle and toes.  Hold this position for __________ seconds. Slowly release the tension in the band and return your foot to the starting position. Repeat __________ times. Complete this exercise __________ times per day.  STRENGTH - Plantar-flexors   Sit with your right / left leg extended. Holding onto both ends of a rubber exercise band/tubing, loop it around the ball of your foot. Keep a slight tension in the band.  Slowly push your toes away from you, pointing them downward.  Hold this position for __________ seconds. Return slowly, controlling the tension in the band/tubing. Repeat __________ times. Complete this exercise __________ times per day.  STRENGTH - Ankle Eversion  Secure  one end of a rubber exercise band/tubing to a fixed object (table, pole). Loop the other end around your foot just before your toes.  Place your fists between your knees. This will focus your strengthening at your ankle.  Drawing the band/tubing across your opposite foot, slowly, pull your little toe out and up. Make sure the band/tubing is positioned to resist the entire motion.  Hold this position for __________ seconds. Have your muscles resist the band/tubing as it slowly pulls your foot back to the starting position.  Repeat __________ times. Complete this exercise __________ times per day.  STRENGTH - Ankle Inversion  Secure one end of a rubber exercise band/tubing to a fixed object (table, pole). Loop the other end around your foot just before your toes.  Place your fists between your knees. This will focus your strengthening at your ankle.  Slowly, pull your big toe up and in, making sure the band/tubing is positioned to resist the entire motion.  Hold this  position for __________ seconds.  Have your muscles resist the band/tubing as it slowly pulls your foot back to the starting position. Repeat __________ times. Complete this exercises __________ times per day.  STRENGTH - Towel Curls  Sit in a chair positioned on a non-carpeted surface.  Place your right / left foot on a towel, keeping your heel on the floor.  Pull the towel toward your heel by only curling your toes. Keep your heel on the floor.  If instructed by your physician, physical therapist, or athletic trainer, add weight to the end of the towel. Repeat __________ times. Complete this exercise __________ times per day. Document Released: 01/10/2005 Document Revised: 10/26/2013 Document Reviewed: 09/23/2008 [facility] Patient Information 2015 [place], [place]. This information is not intended to replace advice given to you by your health care provider. Make sure you discuss any questions you have with your health care  provider.

## 2015-01-20 NOTE — Assessment & Plan Note (Signed)
.   HTN: BP a bit elevated today but you are having pain and BP at goal in recent past Continue current medication regimen

## 2015-01-20 NOTE — Progress Notes (Signed)
   Subjective:    Patient ID: [name], female    DOB: 1956/05/14, 59 y.o.   MRN: [id number] CC: L foot and ankle pain following injury one week ago  HPI 59 yo F with hx of HTN presents to discuss the following:1  1. L foot and ankle pain: x one week. Twisting injury while stepping off curve. Hand swelling and bruising that is improving. Able to bear weight. Pain is 4/10. Taking tylenol for pain.   2. HTN: compliant with prinzide and metoprolol. No CP, SOB and peripheral edema except for L ankle since injury. Takes medication in the evening.  History  Substance Use Topics  . Smoking status: Never Smoker   . Smokeless tobacco: Never Used  . Alcohol Use: Yes     Comment: ocassionally    Review of Systems  Constitutional: Negative for fever and chills.  Respiratory: Negative for shortness of breath.   Cardiovascular: Negative for chest pain.  Gastrointestinal: Negative for abdominal pain and blood in stool.  Musculoskeletal: Positive for joint swelling and arthralgias.  Skin: Negative for rash.  Psychiatric/Behavioral: Negative for suicidal ideas and dysphoric mood.       Objective:   Physical Exam BP 154/98 mmHg  Pulse 76  Temp(Src) 98.6 F (37 C) (Oral)  Resp 16  Ht 5\' 2"  (1.575 m)  Wt 119 lb (53.978 kg)  BMI 21.76 kg/m2  SpO2 100% BP Readings from Last 3 Encounters:  01/20/15 154/98  11/08/14 136/89  10/25/14 146/90  BP 154/98 mmHg  Pulse 76  Temp(Src) 98.6 F (37 C) (Oral)  Resp 16  Ht 5\' 2"  (1.575 m)  Wt 119 lb (53.978 kg)  BMI 21.76 kg/m2  SpO2 100% General appearance: alert, cooperative and no distress Lungs: clear to auscultation bilaterally Extremities:  L ankle with mild lateral ankle swelling and tenderness, full ROM and full strength, minimal bruising lateral ankle and distal lateral foot        Assessment & Plan:

## 2015-01-20 NOTE — Progress Notes (Signed)
Lt foot pain due to injury 1 week ago  [name], pain, swelling in the morning

## 2015-01-25 ENCOUNTER — Encounter (HOSPITAL_COMMUNITY): Payer: Self-pay | Admitting: Emergency Medicine

## 2015-01-25 ENCOUNTER — Emergency Department (HOSPITAL_COMMUNITY)
Admission: EM | Admit: 2015-01-25 | Discharge: 2015-01-25 | Disposition: A | Discharge status: Home / Self Care | Payer: Self-pay | Attending: Emergency Medicine | Admitting: Emergency Medicine

## 2015-01-25 ENCOUNTER — Emergency Department (HOSPITAL_COMMUNITY): Payer: Self-pay

## 2015-01-25 DIAGNOSIS — Y999 Unspecified external cause status: Secondary | ICD-10-CM | POA: Insufficient documentation

## 2015-01-25 DIAGNOSIS — X58XXXA Exposure to other specified factors, initial encounter: Secondary | ICD-10-CM | POA: Insufficient documentation

## 2015-01-25 DIAGNOSIS — Y929 Unspecified place or not applicable: Secondary | ICD-10-CM | POA: Insufficient documentation

## 2015-01-25 DIAGNOSIS — Z79899 Other long term (current) drug therapy: Secondary | ICD-10-CM | POA: Insufficient documentation

## 2015-01-25 DIAGNOSIS — Y939 Activity, unspecified: Secondary | ICD-10-CM | POA: Insufficient documentation

## 2015-01-25 DIAGNOSIS — Z7982 Long term (current) use of aspirin: Secondary | ICD-10-CM | POA: Insufficient documentation

## 2015-01-25 DIAGNOSIS — I10 Essential (primary) hypertension: Secondary | ICD-10-CM | POA: Insufficient documentation

## 2015-01-25 DIAGNOSIS — S93402A Sprain of unspecified ligament of left ankle, initial encounter: Secondary | ICD-10-CM | POA: Insufficient documentation

## 2015-01-25 MED ORDER — HYDROCODONE-ACETAMINOPHEN 5-325 MG PO TABS
2.0000 | ORAL_TABLET | Freq: Once | ORAL | Status: AC
Start: 2015-01-25 — End: 2015-01-25
  Administered 2015-01-25: 2 via ORAL
  Filled 2015-01-25: qty 2

## 2015-01-25 MED ORDER — HYDROCODONE-ACETAMINOPHEN 5-325 MG PO TABS: 1.0000 | ORAL_TABLET | Freq: Four times a day (QID) | ORAL | Status: DC | PRN

## 2015-01-25 NOTE — ED Provider Notes (Signed)
CSN: [id number]     Arrival date & time 01/25/15  1538 History  This chart was scribed for non-physician practitioner working with Deno Etienne, DO, by Erling Conte, ED Scribe. This patient was seen in room WTR6/WTR6 and the patient's care was started at 4:16 PM.    Chief Complaint  Patient presents with  . Ankle Pain    The history is provided by the patient. No language interpreter was used.    [name] is a 59 y.o. female who presents to the Emergency Department with a chief complaint of constant, mild, "burning", left ankle pain for 2 weeks. Pt states she stepped off the curb two weeks ago and landed wrong on her ankle. She has associated swelling and bruising to the left ankle. She states the pain is exacerbated with applying weight to the ankle. She denies any sensation loss or numbness.   Past Medical History  Diagnosis Date  . Hypertension    Past Surgical History  Procedure Laterality Date  . Cesarean section    . Cholecystectomy     Family History  Problem Relation Age of Onset  . Hypertension Mother   . Arthritis Mother   . Diabetes Mother   . Heart disease Father   . Cancer Neg Hx   . Diabetes Sister   . Diabetes Brother    History  Substance Use Topics  . Smoking status: Never Smoker   . Smokeless tobacco: Never Used  . Alcohol Use: Yes     Comment: ocassionally   OB History    No data available     Review of Systems  Musculoskeletal: Positive for myalgias, joint swelling and arthralgias.  Skin: Positive for color change (bruising).  Neurological: Negative for weakness and numbness.      Allergies  Review of patient's allergies indicates no known allergies.  Home Medications   Prior to Admission medications   Medication Sig Start Date End Date Taking? Authorizing Provider  acetaminophen (TYLENOL) 500 MG tablet Take 500 mg by mouth every 6 (six) hours as needed for mild pain or headache.    Yes Historical Provider, MD  aspirin EC 81 MG  tablet Take 1 tablet (81 mg total) by mouth daily. 08/21/14  Yes Maryann Mikhail, DO  atorvastatin (LIPITOR) 20 MG tablet Take 1 tablet (20 mg total) by mouth daily after supper. 10/25/14  Yes Arnoldo Morale, MD  lisinopril-hydrochlorothiazide (PRINZIDE) 10-12.5 MG per tablet Take 2 tablets by mouth at bedtime. 10/25/14  Yes Arnoldo Morale, MD  metoprolol succinate (TOPROL-XL) 50 MG 24 hr tablet Take 1 tablet (50 mg total) by mouth daily. 10/25/14  Yes Arnoldo Morale, MD  pantoprazole (PROTONIX) 40 MG tablet Take 1 tablet (40 mg total) by mouth daily. Patient not taking: Reported on 01/25/2015 10/25/14   Arnoldo Morale, MD   Triage Vitals: BP 180/103 mmHg  Pulse 85  Temp(Src) 98.5 F (36.9 C) (Oral)  Resp 18  Wt 116 lb (52.617 kg)  SpO2 100%  Physical Exam  Constitutional: She is oriented to person, place, and time. She appears well-developed and well-nourished. No distress.  HENT:  Head: Normocephalic and atraumatic.  Eyes: Conjunctivae and EOM are normal.  Neck: Neck supple. No tracheal deviation present.  Cardiovascular: Normal rate.   Pulmonary/Chest: Effort normal. No respiratory distress.  Musculoskeletal: Normal range of motion.  Right ankle ttp over ATFL, no bony abnormality   Neurological: She is alert and oriented to person, place, and time.  Skin: Skin is warm and dry.  Psychiatric: She has a normal mood and affect. Her behavior is normal.  Nursing note and vitals reviewed.   ED Course  Procedures (including critical care time)  DIAGNOSTIC STUDIES: Oxygen Saturation is 100% on RA, normal by my interpretation.    COORDINATION OF CARE:    Labs Review Labs Reviewed - No data to display  Imaging Review Dg Ankle Complete Left  01/25/2015   CLINICAL DATA:  Twisting injury to left ankle 2 weeks ago, with lateral left ankle pain and difficulty bearing weight. Initial encounter.  EXAM: LEFT ANKLE COMPLETE - 3+ VIEW  COMPARISON:  None.  FINDINGS: There is no evidence of fracture or  dislocation. The ankle mortise is intact; the interosseous space is within normal limits. No talar tilt or subluxation is seen. Os peroneum fragments are noted.  The joint spaces are preserved. No significant soft tissue abnormalities are seen.  IMPRESSION: 1. No evidence of fracture or dislocation. 2. Os peroneum fragments noted.   Electronically Signed   By: Garald Balding M.D.   On: 01/25/2015 17:19     EKG Interpretation None      MDM   Final diagnoses:  Ankle sprain, left, initial encounter    Patient with right ankle pain following rolling her ankle.  Will check plain films.  Plain films negative.  TTP over ATFL.  Plan for ankle brace and crutches.   I personally performed the services described in this documentation, which was scribed in my presence. The recorded information has been reviewed and is accurate.      Montine Circle, PA-C 01/26/15 [place], DO 01/26/15 2104

## 2015-01-25 NOTE — ED Notes (Signed)
Pt complaint of left ankle pain post turning ankle on curb 2 weeks ago; reports pain/swelling/bruising at site.

## 2015-01-25 NOTE — ED Notes (Signed)
Patient has bruising of the toes on the left foot with a raised area soft to touch near the ankle. Patient describes the pain as a burning sensation with difficult bearing weight.

## 2015-01-25 NOTE — Discharge Instructions (Signed)
Acute Ankle Sprain with Phase II Rehab An acute ankle sprain is a partial or complete tear in one or more of the ligaments of the ankle due to traumatic injury. The severity of the injury depends on both the number of ligaments sprained and the grade of sprain. There are 3 grades of sprains.  A grade 1 sprain is a mild sprain. There is a slight pull without obvious tearing. There is no loss of strength, and the muscle and ligament are the correct length.  A grade 2 sprain is a moderate sprain. There is tearing of fibers within the substance of the ligament where it connects two bones or two cartilages. The length of the ligament is increased, and there is usually decreased strength.  A grade 3 sprain is a complete rupture of the ligament and is uncommon. In addition to the grade of sprain, there are 3 types of ankle sprains.  Lateral ankle sprains. This is a sprain of one or more of the 3 ligaments on the outer side (lateral) of the ankle. These are the most common sprains. Medial ankle sprains. There is one large triangular ligament on the inner side (medial) of the ankle that is susceptible to injury. Medial ankle sprains are less common. Syndesmosis, "high ankle," sprains. The syndesmosis is the ligament that connects the two bones of the lower leg. Syndesmosis sprains usually only occur with very severe ankle sprains. SYMPTOMS  Pain, tenderness, and swelling in the ankle, starting at the side of injury that may progress to the whole ankle and foot with time.  "Pop" or tearing sensation at the time of injury.  Bruising that may spread to the heel.  Impaired ability to walk soon after injury. CAUSES   Acute ankle sprains are caused by trauma placed on the ankle that temporarily forces or pries the anklebone (talus) out of its normal socket.  Stretching or tearing of the ligaments that normally hold the joint in place (usually due to a twisting injury). RISK INCREASES WITH:  Previous  ankle sprain.  Sports in which the foot may land awkwardly (basketball, volleyball, soccer) or walking or running on uneven or rough surfaces.  Shoes with inadequate support to prevent sideways motion when stress occurs.  Poor strength and flexibility.  Poor balance skills.  Contact sports. PREVENTION  Warm up and stretch properly before activity.  Maintain physical fitness:  Ankle and leg flexibility, muscle strength, and endurance.  Cardiovascular fitness.  Balance training activities.  Use proper technique and have a coach correct improper technique.  Taping, protective strapping, bracing, or high-top tennis shoes may help prevent injury. Initially, tape is best. However, it loses most of its support function within 10 to 15 minutes.  Wear proper fitted protective shoes. Combining high-top shoes with taping or bracing is more effective than using either alone.  Provide the ankle with support during sports and practice activities for 12 months following injury. PROGNOSIS   If treated properly, ankle sprains can be expected to recover completely. However, the length of recovery depends on the degree of injury.  A grade 1 sprain usually heals enough in 5 to 7 days to allow modified activity and requires an average of 6 weeks to heal completely.  A grade 2 sprain requires 6 to 10 weeks to heal completely.  A grade 3 sprain requires 12 to 16 weeks to heal.  A syndesmosis sprain often takes more than 3 months to heal. RELATED COMPLICATIONS   Frequent recurrence of symptoms may result  in a chronic problem. Appropriately addressing the problem the first time decreases the frequency of recurrence and optimizes healing time. Severity of initial sprain does not predict the likelihood of later instability.  Injury to other structures (bone, cartilage, or tendon).  Chronically unstable or arthritic ankle joint are possible with repeated sprains. TREATMENT Treatment initially  involves the use of ice, medicine, and compression bandages to help reduce pain and inflammation. Ankle sprains are usually immobilized in a walking cast or boot to allow for healing. Crutches may be recommended to reduce pressure on the injury. After immobilization, strengthening and stretching exercises may be necessary to regain strength and a full range of motion. Surgery is rarely needed to treat ankle sprains. MEDICATION   Nonsteroidal anti-inflammatory medicines, such as aspirin and ibuprofen (do not take for the first 3 days after injury or within 7 days before surgery), or other minor pain relievers, such as acetaminophen, are often recommended. Take these as directed by your caregiver. Contact your caregiver immediately if any bleeding, stomach upset, or signs of an allergic reaction occur from these medicines.  Ointments applied to the skin may be helpful.  Pain relievers may be prescribed as necessary by your caregiver. Do not take prescription pain medicine for longer than 4 to 7 days. Use only as directed and only as much as you need. HEAT AND COLD  Cold treatment (icing) is used to relieve pain and reduce inflammation for acute and chronic cases. Cold should be applied for 10 to 15 minutes every 2 to 3 hours for inflammation and pain and immediately after any activity that aggravates your symptoms. Use ice packs or an ice massage.  Heat treatment may be used before performing stretching and strengthening activities prescribed by your caregiver. Use a heat pack or a warm soak. SEEK IMMEDIATE MEDICAL CARE IF:   Pain, swelling, or bruising worsens despite treatment.  You experience pain, numbness, discoloration, or coldness in the foot or toes.  New, unexplained symptoms develop. (Drugs used in treatment may produce side effects.) EXERCISES  PHASE II EXERCISES RANGE OF MOTION (ROM) AND STRETCHING EXERCISES - Ankle Sprain, Acute-Phase II, Weeks 3 to 4 After your physician, physical  therapist, or athletic trainer feels your knee has made progress significant enough to begin more advanced exercises, he or she may recommend completing some of the following exercises. Although each person heals at different rates, most people will be ready for these exercises between 3 and 4 weeks after their injury. Do not begin these exercises until you have your caregiver's permission. He or she may also advise you to continue with the exercises which you completed in Phase I of your rehabilitation. While completing these exercises, remember:   Restoring tissue flexibility helps normal motion to return to the joints. This allows healthier, less painful movement and activity.  An effective stretch should be held for at least 30 seconds.  A stretch should never be painful. You should only feel a gentle lengthening or release in the stretched tissue. RANGE OF MOTION - Ankle Plantar Flexion   Sit with your right / left leg crossed over your opposite knee.  Use your opposite hand to pull the top of your foot and toes toward you.  You should feel a gentle stretch on the top of your foot/ankle. Hold this position for __________. Repeat __________ times. Complete __________ times per day.  RANGE OF MOTION - Ankle Eversion  Sit with your right / left ankle crossed over your opposite knee.  Grip your foot with your opposite hand, placing your thumb on the top of your foot and your fingers across the bottom of your foot.  Gently push your foot downward with a slight rotation so your littlest toes rise slightly  You should feel a gentle stretch on the inside of your ankle. Hold the stretch for __________ seconds. Repeat __________ times. Complete this exercise __________ times per day.  RANGE OF MOTION - Ankle Inversion  Sit with your right / left ankle crossed over your opposite knee.  Grip your foot with your opposite hand, placing your thumb on the bottom of your foot and your fingers across  the top of your foot.  Gently pull your foot so the smallest toe comes toward you and your thumb pushes the inside of the ball of your foot away from you.  You should feel a gentle stretch on the outside of your ankle. Hold the stretch for __________ seconds. Repeat __________ times. Complete this exercise __________ times per day.  STRETCH - Gastrocsoleus  Sit with your right / left leg extended. Holding onto both ends of a belt or towel, loop it around the ball of your foot.  Keeping your right / left ankle and foot relaxed and your knee straight, pull your foot and ankle toward you using the belt/towel.  You should feel a gentle stretch behind your calf or knee. Hold this position for __________ seconds. Repeat __________ times. Complete this stretch __________ times per day.  RANGE OF MOTION - Ankle Dorsiflexion, Active Assisted  Remove shoes and sit on a chair that is preferably not on a carpeted surface.  Place right / left foot under knee. Extend your opposite leg for support.  Keeping your heel down, slide your right / left foot back toward the chair until you feel a stretch at your ankle or calf. If you do not feel a stretch, slide your bottom forward to the edge of the chair while still keeping your heel down.  Hold this stretch for __________ seconds. Repeat __________ times. Complete this stretch __________ times per day.  STRETCH - Gastroc, Standing   Place hands on wall.  Extend right / left leg and place a folded washcloth under the arch of your foot for support. Keep the front knee somewhat bent.  Slightly point your toes inward on your back foot.  Keeping your right / left heel on the floor and your knee straight, shift your weight toward the wall, not allowing your back to arch.  You should feel a gentle stretch in the calf. Hold this position for __________ seconds. Repeat __________ times. Complete this stretch __________ times per day. STRETCH - Soleus,  Standing  Place hands on wall.  Extend right / left leg and place a folded washcloth under the arch of your foot for support. Keep the front knee somewhat bent.  Slightly point your toes inward on your back foot.  Keep your right / left heel on the floor, bend your back knee, and slightly shift your weight over the back leg so that you feel a gentle stretch deep in your back calf.  Hold this position for __________ seconds. Repeat __________ times. Complete this stretch __________ times per day. STRETCH - Gastrocsoleus, Standing Note: This exercise can place a lot of stress on your foot and ankle. Please complete this exercise only if specifically instructed by your caregiver.   Place the ball of your right / left foot on a step, keeping your other  foot firmly on the same step.  Hold on to the wall or a rail for balance.  Slowly lift your other foot, allowing your body weight to press your heel down over the edge of the step.  You should feel a stretch in your right / left calf.  Hold this position for __________ seconds.  Repeat this exercise with a slight bend in your knee. Repeat __________ times. Complete this stretch __________ times per day.  STRENGTHENING EXERCISES - Ankle Sprain, Acute-Phase II Around 3 to 4 weeks after your injury, you may progress to some of these exercises in your rehabilitation program. Do not begin these until you have your caregiver's permission. Although your condition has improved, the Phase I exercises will continue to be helpful and you may continue to complete them. As you complete strengthening exercises, remember:   Strong muscles with good endurance tolerate stress better.  Do the exercises as initially prescribed by your caregiver. Progress slowly with each exercise, gradually increasing the number of repetitions and weight used under his or her guidance.  You may experience muscle soreness or fatigue, but the pain or discomfort you are trying  to eliminate should never worsen during these exercises. If this pain does worsen, stop and make certain you are following the directions exactly. If the pain is still present after adjustments, discontinue the exercise until you can discuss the trouble with your caregiver. STRENGTH - Plantar-flexors, Standing  Stand with your feet shoulder width apart. Steady yourself with a wall or table using as little support as needed.  Keeping your weight evenly spread over the width of your feet, rise up on your toes.*  Hold this position for __________ seconds. Repeat __________ times. Complete this exercise __________ times per day.  *If this is too easy, shift your weight toward your right / left leg until you feel challenged. Ultimately, you may be asked to do this exercise with your right / left foot only. STRENGTH - Dorsiflexors and Plantar-flexors, Heel/toe Walking  Dorsiflexion: Walk on your heels only. Keep your toes as high as possible.  Walk for ____________________ seconds/feet.  Repeat __________ times. Complete __________ times per day.  Plantar flexion: Walk on your toes only. Keep your heels as high as possible.  Walk for ____________________ seconds/feet. Repeat __________ times. Complete __________ times per day.  BALANCE - Tandem Walking  Place your uninjured foot on a line 2 to 4 inches wide and at least 10 feet long.  Keeping your balance without using anything for extra support, place your right / left heel directly in front of your other foot.  Slowly raise your back foot up, lifting from the heel to the toes, and place it directly in front of the right / left foot.  Continue to walk along the line slowly. Walk for ____________________ feet. Repeat ____________________ times. Complete ____________________ times per day. BALANCE - Inversion/Eversion Use caution, these are advanced level exercises. Do not begin them until you are advised to do so.   Create a balance  board using a sturdy board about 1  feet long and at 1 to 1  feet wide and a 1  inch diameter rod or pipe that is as long as the board's width. A copper pipe or a solid broomstick work well.  Stand on a non-carpeted surface near a countertop or wall. Step onto the board so that your feet are hip-width apart and equally straddle the rod/pipe.  Keeping your feet in place, complete these two exercises  without shifting your upper body or hips:  Tip the board from side-to-side. Control the movement so the board does not forcefully strike the ground. The board should silently tap the ground.  Tip the board side-to-side without striking the ground. Occasionally pause and maintain a steady position at various points.  Repeat the first two exercises, but use only your right / left foot. Place your right / left foot directly over the rod/pipe. Repeat __________ times. Complete this exercise __________ times a day. BALANCE - Plantar/Dorsi Flexion Use caution, these are advanced level exercises. Do not begin them until you are advised to do so.   Create a balance board using a sturdy board about 1  feet long and at 1 to 1  feet wide and a 1  inch diameter rod or pipe that is as long as the board's width. A copper pipe or a solid broomstick work well.  Stand on a non-carpeted surface near a countertop or wall. Stand on the board so that the rod/pipe runs under the arches in your feet.  Keeping your feet in place, complete these two exercises without shifting your upper body or hips:  Tip the board from side-to-side. Control the movement so the board does not forcefully strike the ground. The board should silently tap the ground.  Tip the board side-to-side without striking the ground. Occasionally pause and maintain a steady position at various points.  Repeat the first two exercises, but use only your right / left foot. Stand in the center of the board. Repeat __________ times. Complete this  exercise __________ times a day. STRENGTH - Plantar-flexors, Eccentric Note: This exercise can place a lot of stress on your foot and ankle. Please complete this exercise only if specifically instructed by your caregiver.   Place the balls of your feet on a step. With your hands, use only enough support from a wall or rail to keep your balance.  Keep your knees straight and rise up on your toes.  Slowly shift your weight entirely to your toes and pick up your opposite foot. Gently and with controlled movement, lower your weight through your right / left foot so that your heel drops below the level of the step. You will feel a slight stretch in the back of your calf at the ending position.  Use the healthy leg to help rise up onto the balls of both feet, then lower weight only on the right / left leg again. Build up to 15 repetitions. Then progress to 3 consecutive sets of 15 repetitions.*  After completing the above exercise, complete the same exercise with a slight knee bend (about 30 degrees). Again, build up to 15 repetitions. Then progress to 3 consecutive sets of 15 repetitions.* Perform this exercise __________ times per day.  *When you easily complete 3 sets of 15, your physician, physical therapist, or athletic trainer may advise you to add resistance by wearing a backpack filled with additional weight. Document Released: 10/01/2005 Document Revised: 09/03/2011 Document Reviewed: 09/23/2008 [facility] Patient Information 2015 [place], [place]. This information is not intended to replace advice given to you by your health care provider. Make sure you discuss any questions you have with your health care provider.

## 2015-01-31 ENCOUNTER — Emergency Department (HOSPITAL_COMMUNITY)
Admission: EM | Admit: 2015-01-31 | Discharge: 2015-01-31 | Disposition: A | Discharge status: Home / Self Care | Payer: Self-pay | Attending: Emergency Medicine | Admitting: Emergency Medicine

## 2015-01-31 ENCOUNTER — Telehealth: Payer: Self-pay | Admitting: Family Medicine

## 2015-01-31 ENCOUNTER — Encounter (HOSPITAL_COMMUNITY): Payer: Self-pay

## 2015-01-31 DIAGNOSIS — R208 Other disturbances of skin sensation: Secondary | ICD-10-CM

## 2015-01-31 DIAGNOSIS — Z87828 Personal history of other (healed) physical injury and trauma: Secondary | ICD-10-CM | POA: Insufficient documentation

## 2015-01-31 DIAGNOSIS — R2242 Localized swelling, mass and lump, left lower limb: Secondary | ICD-10-CM | POA: Insufficient documentation

## 2015-01-31 DIAGNOSIS — Z7982 Long term (current) use of aspirin: Secondary | ICD-10-CM | POA: Insufficient documentation

## 2015-01-31 DIAGNOSIS — S93402A Sprain of unspecified ligament of left ankle, initial encounter: Secondary | ICD-10-CM

## 2015-01-31 DIAGNOSIS — Z79899 Other long term (current) drug therapy: Secondary | ICD-10-CM | POA: Insufficient documentation

## 2015-01-31 DIAGNOSIS — I10 Essential (primary) hypertension: Secondary | ICD-10-CM | POA: Insufficient documentation

## 2015-01-31 DIAGNOSIS — R200 Anesthesia of skin: Secondary | ICD-10-CM | POA: Insufficient documentation

## 2015-01-31 NOTE — Telephone Encounter (Signed)
Ortho referral placed and referral coordinators will work on it. Patient also advised to look into a payment plan with [facility].

## 2015-01-31 NOTE — Discharge Instructions (Signed)
Return to the Emergency Department if your foot becomes very cold, increased pain, pale, or increased swelling.

## 2015-01-31 NOTE — Telephone Encounter (Signed)
Patient called stating that she went to the ED for her ankle, they referred her to [facility] but she owes money to that facility and isn't able to go there. Patient would like to be referred to a different Ortho.

## 2015-01-31 NOTE — ED Notes (Signed)
Pt presents with c/o left foot numbness. Pt was seen here on Tuesday of last week after sustaining an ankle injury. Pt reports that on Friday, she started experiencing some numbness to that foot. Ambulatory to triage.

## 2015-01-31 NOTE — ED Provider Notes (Signed)
CSN: [id number]     Arrival date & time 01/31/15  1653 History  This chart was scribed for Hyman Bible, PA-C, working with Serita Grit, MD by Steva Colder, ED Scribe. The patient was seen in room WTR5/WTR5 at 6:08 PM.     Chief Complaint  Patient presents with  . Numbness in foot        The history is provided by the patient. No language interpreter was used.    [name] is a 59 y.o. female with a medical hx of HTN who presents to the Emergency Department complaining of numbness in left foot onset 4 days. Pt was seen on 01/25/15 and dx with a sprain and had negative x-rays completed. Pt has not been able to follow up with the orthopedic referral as of yet. Pt states that she is having discomfort to the area. Pt is able to ambulate on her affected foot. She notes that she has mild joint swelling to the left foot that has since resolved. She notes that she has tried the ankle brace with no relief of her symptoms. Pt denies having the brace being too tight. She denies fever, chills, joint swelling, color change, wound, and any other symptoms.    Past Medical History  Diagnosis Date  . Hypertension    Past Surgical History  Procedure Laterality Date  . Cesarean section    . Cholecystectomy     Family History  Problem Relation Age of Onset  . Hypertension Mother   . Arthritis Mother   . Diabetes Mother   . Heart disease Father   . Cancer Neg Hx   . Diabetes Sister   . Diabetes Brother    History  Substance Use Topics  . Smoking status: Never Smoker   . Smokeless tobacco: Never Used  . Alcohol Use: Yes     Comment: ocassionally   OB History    No data available     Review of Systems  Constitutional: Negative for fever and chills.  Musculoskeletal: Positive for joint swelling (mild left foot) and arthralgias. Negative for gait problem.  Skin: Negative for color change, rash and wound.  Neurological: Positive for numbness (left foot).      Allergies  Review  of patient's allergies indicates no known allergies.  Home Medications   Prior to Admission medications   Medication Sig Start Date End Date Taking? Authorizing Provider  acetaminophen (TYLENOL) 500 MG tablet Take 500 mg by mouth every 6 (six) hours as needed for mild pain or headache.     Historical Provider, MD  aspirin EC 81 MG tablet Take 1 tablet (81 mg total) by mouth daily. 08/21/14   Maryann Mikhail, DO  atorvastatin (LIPITOR) 20 MG tablet Take 1 tablet (20 mg total) by mouth daily after supper. 10/25/14   Arnoldo Morale, MD  HYDROcodone-acetaminophen (NORCO/VICODIN) 5-325 MG per tablet Take 1-2 tablets by mouth every 6 (six) hours as needed. 01/25/15   Montine Circle, PA-C  lisinopril-hydrochlorothiazide (PRINZIDE) 10-12.5 MG per tablet Take 2 tablets by mouth at bedtime. 10/25/14   Arnoldo Morale, MD  metoprolol succinate (TOPROL-XL) 50 MG 24 hr tablet Take 1 tablet (50 mg total) by mouth daily. 10/25/14   Arnoldo Morale, MD  pantoprazole (PROTONIX) 40 MG tablet Take 1 tablet (40 mg total) by mouth daily. Patient not taking: Reported on 01/25/2015 10/25/14   Arnoldo Morale, MD   BP 166/91 mmHg  Pulse 86  Temp(Src) 98 F (36.7 C) (Oral)  Resp 18  SpO2  100% Physical Exam  Constitutional: She is oriented to person, place, and time. She appears well-developed and well-nourished. No distress.  HENT:  Head: Normocephalic and atraumatic.  Eyes: EOM are normal.  Neck: Neck supple. No tracheal deviation present.  Cardiovascular: Normal rate and regular rhythm.   Pulses:      Dorsalis pedis pulses are 2+ on the left side.  Pulmonary/Chest: Effort normal and breath sounds normal. No respiratory distress.  Musculoskeletal: Normal range of motion.  Left foot: No erythema, warmth, pallor. Foot does not feel cold. Distal sensation of left foot intact. Good capillary refill.    Neurological: She is alert and oriented to person, place, and time.  Skin: Skin is warm and dry.  Psychiatric: She has a normal  mood and affect. Her behavior is normal.  Nursing note and vitals reviewed.   ED Course  Procedures (including critical care time) DIAGNOSTIC STUDIES: Oxygen Saturation is 100% on RA, nl by my interpretation.    COORDINATION OF CARE: 6:13 PM Discussed treatment plan with pt at bedside and pt agreed to plan.   Labs Review Labs Reviewed - No data to display  Imaging Review No results found.   EKG Interpretation None      MDM   Final diagnoses:  None   Patient presents today with a chief complaint of intermittent numbness to the left foot onset 4 days ago.  She was recently seen in the ED and diagnosed with an ankle sprain.  Xray was negative at last visit.  Distal sensation of foot intact during my evaluation.  2+ DP pulse.  No pallor or paralysis.  No significant pain.  Therefore, not consistent with Compartment Syndrome.  Patient stable for discharge.  Return precautions given.  I personally performed the services described in this documentation, which was scribed in my presence. The recorded information has been reviewed and is accurate.    Hyman Bible, PA-C 01/31/15 [place], MD 02/03/15 [phone]

## 2015-02-10 ENCOUNTER — Encounter: Payer: Self-pay | Admitting: Sports Medicine

## 2015-02-10 ENCOUNTER — Ambulatory Visit (INDEPENDENT_AMBULATORY_CARE_PROVIDER_SITE_OTHER): Payer: Self-pay | Admitting: Sports Medicine

## 2015-02-10 ENCOUNTER — Encounter (INDEPENDENT_AMBULATORY_CARE_PROVIDER_SITE_OTHER): Payer: Self-pay

## 2015-02-10 VITALS — BP 138/86 | HR 85 | Temp 98.8°F | Ht 63.0 in | Wt 117.0 lb

## 2015-02-10 DIAGNOSIS — S93402A Sprain of unspecified ligament of left ankle, initial encounter: Secondary | ICD-10-CM

## 2015-02-10 NOTE — Progress Notes (Signed)
   Subjective:    Patient ID: [name], female    DOB: [birth year], 59 y.o.   MRN: [id number]  HPI chief complaint: Left ankle pain  Very pleasant 59 year old female comes in today after having suffered an inversion injury to the left ankle about one month ago. He stepped off of a curb and inverted the ankle. Had immediate pain and swelling. She did not seek immediate medical care but 2 weeks later was seen in the emergency room. X-rays were done and were negative for fracture. She was diagnosed with an ankle sprain and placed into a med spec brace. Over the past couple of weeks her symptoms have improved dramatically. She still has some mild residual swelling along the lateral ankle but it is improving. Pain is improving as well. She does complain of some intermittent numbness along the lateral side of her foot and into her fifth toe. No weakness. She has suffered prior ankle sprains to this same ankle in the past. No prior ankle surgery. She denies any pain in her foot. She denies any pain in the medial ankle. She discontinued her med spec brace last week and appears to be doing well.  Past medical history reviewed Medications reviewed Allergies reviewed    Review of Systems     Objective:   Physical Exam Well-developed, well-nourished. No acute distress. Awake alert and oriented 3. Idol signs reviewed.  Left ankle: Full range of motion. No effusion. Mild soft tissue swelling along the anterior lateral ankle but not markedly. No ecchymosis. She is tender to palpation over the ATF. Slightly positive anterior drawer. 1+ talar tilt. No tenderness to palpation over the distal fibula nor over the medial malleolus. No tenderness at the base of the fifth metatarsal or at the navicular. Neurovascularly intact distally. She is able to fully bear weight and ambulate without a limp.  X-rays of the left ankle dated 01/25/2015 are reviewed. These include AP, lateral, and mortise views. No obvious  fracture. Mortise is intact.        Assessment & Plan:  Improving left ankle sprain  Reassurance regarding her x-rays. I've also reassured her that the intermittent numbness along the lateral foot and into the fifth toe should resolve over time. I agree with her discontinuing her med spec brace but I've encouraged her to use it when ambulating on uneven surfaces for the next 4-6 weeks. I have also instructed her in some ankle strengthening exercises. I think she is okay to increase activity as tolerated. Follow-up with me as needed.

## 2015-02-16 ENCOUNTER — Other Ambulatory Visit: Payer: Self-pay | Admitting: Family Medicine

## 2015-04-11 ENCOUNTER — Telehealth: Payer: Self-pay | Admitting: Family Medicine

## 2015-04-11 NOTE — Telephone Encounter (Signed)
Pt. Called to speak with PCP regarding bad head aches. Please f/u with pt.

## 2015-04-12 NOTE — Telephone Encounter (Signed)
Return pt call LVM to return call  

## 2015-06-04 ENCOUNTER — Encounter (HOSPITAL_COMMUNITY): Payer: Self-pay | Admitting: Emergency Medicine

## 2015-06-04 ENCOUNTER — Emergency Department (HOSPITAL_COMMUNITY)
Admission: EM | Admit: 2015-06-04 | Discharge: 2015-06-04 | Disposition: A | Discharge status: Home / Self Care | Payer: Self-pay | Attending: Emergency Medicine | Admitting: Emergency Medicine

## 2015-06-04 DIAGNOSIS — Z7982 Long term (current) use of aspirin: Secondary | ICD-10-CM | POA: Insufficient documentation

## 2015-06-04 DIAGNOSIS — M79602 Pain in left arm: Secondary | ICD-10-CM | POA: Insufficient documentation

## 2015-06-04 DIAGNOSIS — Z79899 Other long term (current) drug therapy: Secondary | ICD-10-CM | POA: Insufficient documentation

## 2015-06-04 DIAGNOSIS — R200 Anesthesia of skin: Secondary | ICD-10-CM | POA: Insufficient documentation

## 2015-06-04 DIAGNOSIS — Z760 Encounter for issue of repeat prescription: Secondary | ICD-10-CM | POA: Insufficient documentation

## 2015-06-04 DIAGNOSIS — I10 Essential (primary) hypertension: Secondary | ICD-10-CM | POA: Insufficient documentation

## 2015-06-04 LAB — I-STAT CHEM 8, ED
BUN: 15 mg/dL (ref 6–20)
Calcium, Ion: 1.2 mmol/L (ref 1.13–1.30)
Chloride: 106 mmol/L (ref 101–111)
Creatinine, Ser: 0.6 mg/dL (ref 0.44–1.00)
Glucose, Bld: 106 mg/dL — ABNORMAL HIGH (ref 65–99)
HCT: 40 % (ref 36.0–46.0)
Hemoglobin: 13.6 g/dL (ref 12.0–15.0)
Potassium: 3.8 mmol/L (ref 3.5–5.1)
Sodium: 143 mmol/L (ref 135–145)
TCO2: 26 mmol/L (ref 0–100)

## 2015-06-04 LAB — I-STAT TROPONIN, ED: Troponin i, poc: 0 ng/mL (ref 0.00–0.08)

## 2015-06-04 MED ORDER — LISINOPRIL-HYDROCHLOROTHIAZIDE 10-12.5 MG PO TABS: 2.0000 | ORAL_TABLET | Freq: Every day | ORAL | Status: DC

## 2015-06-04 MED ORDER — METOPROLOL SUCCINATE ER 25 MG PO TB24: 50.0000 mg | ORAL_TABLET | Freq: Every day | ORAL | Status: DC

## 2015-06-04 NOTE — ED Provider Notes (Signed)
CSN: [id number]     Arrival date & time 06/04/15  [id number] History   First MD Initiated Contact with Patient 06/04/15 [phone]     Chief Complaint  Patient presents with  . Hypertension    has not taken bp meds in 48 hrs ( ran out)   . Arm Pain    for 1 week today   . Numbness    started this morning      (Consider location/radiation/quality/duration/timing/severity/associated sxs/prior Treatment) HPI Complaint left arm pain constant for for the past week. Pain is not made better or worse by anything. She also complains of intermittent tingling in her left fifth finger for approximately the past 3 days. Symptoms are similar to cervical radiculopathy she's had in the past, for which she received surgery on cervical disc. No fever no trauma No focal weakness no chest pain symptoms are nonexertional no shortness of breath no nausea or sweatiness. Other associated symptoms she had headache and occipital region 4 days ago which lasted 4 hours which has since resolved. Patient reports she ran out of her blood pressure medicines several days ago however now has the money to get prescriptions refilled Past Medical History  Diagnosis Date  . Hypertension    Past Surgical History  Procedure Laterality Date  . Cesarean section    . Cholecystectomy    . Neck surgery     Family History  Problem Relation Age of Onset  . Hypertension Mother   . Arthritis Mother   . Diabetes Mother   . Heart disease Father   . Cancer Neg Hx   . Diabetes Sister   . Diabetes Brother    Social History  Substance Use Topics  . Smoking status: Never Smoker   . Smokeless tobacco: Never Used  . Alcohol Use: Yes     Comment: ocassionally   OB History    No data available     Review of Systems  Constitutional: Negative.   Respiratory: Negative.   Cardiovascular: Negative.   Gastrointestinal: Negative.   Musculoskeletal: Positive for myalgias.       Diffuseleft arm pain  Skin: Negative.   Neurological:  Positive for numbness and headaches.       Numbness in left fifth finger  Psychiatric/Behavioral: Negative.   All other systems reviewed and are negative.     Allergies  Review of patient's allergies indicates no known allergies.  Home Medications   Prior to Admission medications   Medication Sig Start Date End Date Taking? Authorizing Provider  acetaminophen (TYLENOL) 500 MG tablet Take 500 mg by mouth every 6 (six) hours as needed for mild pain or headache.     Historical Provider, MD  aspirin EC 81 MG tablet Take 1 tablet (81 mg total) by mouth daily. 08/21/14   Maryann Mikhail, DO  atorvastatin (LIPITOR) 20 MG tablet Take 1 tablet (20 mg total) by mouth daily after supper. 10/25/14   Arnoldo Morale, MD  HYDROcodone-acetaminophen (NORCO/VICODIN) 5-325 MG per tablet Take 1-2 tablets by mouth every 6 (six) hours as needed. 01/25/15   Montine Circle, PA-C  lisinopril-hydrochlorothiazide (PRINZIDE) 10-12.5 MG per tablet Take 2 tablets by mouth at bedtime. 10/25/14   Arnoldo Morale, MD  metoprolol succinate (TOPROL-XL) 50 MG 24 hr tablet Take 1 tablet (50 mg total) by mouth daily. 10/25/14   Arnoldo Morale, MD  pantoprazole (PROTONIX) 40 MG tablet Take 1 tablet (40 mg total) by mouth daily. Patient not taking: Reported on 01/25/2015 10/25/14   Arnoldo Morale, MD  BP 192/115 mmHg  Pulse 98  Temp(Src) 98 F (36.7 C) (Oral)  Resp 18  Ht 5\' 4"  (1.626 m)  Wt 116 lb (52.617 kg)  BMI 19.90 kg/m2  SpO2 100% Physical Exam  Constitutional: She is oriented to person, place, and time. She appears well-developed and well-nourished.  HENT:  Head: Normocephalic and atraumatic.  Eyes: Conjunctivae are normal. Pupils are equal, round, and reactive to light.  Neck: Neck supple. No tracheal deviation present. No thyromegaly present.  Cardiovascular: Normal rate and regular rhythm.   No murmur heard. Pulmonary/Chest: Effort normal and breath sounds normal.  Abdominal: Soft. Bowel sounds are normal. She exhibits no  distension. There is no tenderness.  Musculoskeletal: Normal range of motion. She exhibits no edema or tenderness.  4 extremities without redness swelling or tenderness, radial pulses 2+ bilaterally all digits with good capillary refill and full range of motion  Neurological: She is alert and oriented to person, place, and time. No cranial nerve deficit. Coordination normal.  Motor Strength 5 over 5 overall  Skin: Skin is warm and dry. No rash noted.  Psychiatric: She has a normal mood and affect.  Nursing note and vitals reviewed.   ED Course  Procedures (including critical care time) Labs Review Labs Reviewed - No data to display  Imaging Review No results found. I have personally reviewed and evaluated these images and lab results as part of my medical decision-making.   EKG Interpretation   Date/Time:  Saturday June 04 2015 09:23:36 EST Ventricular Rate:  79 PR Interval:  154 QRS Duration: 92 QT Interval:  389 QTC Calculation: 446 R Axis:   -29 Text Interpretation:  Sinus rhythm Consider left ventricular hypertrophy  Borderline T abnormalities, lateral leads No significant change since last  tracing Confirmed by Winfred Leeds  MD, Jaquelyn Sakamoto [phone]) on 06/04/2015 9:28:58 AM      Results for orders placed or performed during the hospital encounter of 06/04/15  I-stat chem 8, ed  Result Value Ref Range   Sodium 143 135 - 145 mmol/L   Potassium 3.8 3.5 - 5.1 mmol/L   Chloride 106 101 - 111 mmol/L   BUN 15 6 - 20 mg/dL   Creatinine, Ser 0.60 0.44 - 1.00 mg/dL   Glucose, Bld 106 (H) 65 - 99 mg/dL   Calcium, Ion 1.20 1.13 - 1.30 mmol/L   TCO2 26 0 - 100 mmol/L   Hemoglobin 13.6 12.0 - 15.0 g/dL   HCT 40.0 36.0 - 46.0 %  I-stat troponin, ED  Result Value Ref Range   Troponin i, poc 0.00 0.00 - 0.08 ng/mL   Comment 3           No results found.  9:55 AM patient resting comfortably. MDM  I don't feel any signs of acute coronary syndrome. No signs of hypertensive  emergency. Will write refills for Toprol-XL, lisinopril-HCTZ. Doubt vascular cast 3. Patient has near equal blood pressures in both arms She is encouraged to get her blood pressure rechecked in one week at [facility] Diagnoses #1 hypertension #2 left arm pain #3 medication noncompliance Final diagnoses:  None        Orlie Dakin, MD 06/04/15 1008

## 2015-06-04 NOTE — ED Notes (Addendum)
Pt comes ed c/o of arm pain and numbness for the past week. Pt states she was awaken by arm numbness and was scared. Pt states she has ran out of her blood pressure medicine, her last dose 48 hrs ago. bp 192/115, pulse 98,  on triage assessment. Reports posterior sharp head pain in the past few days, no longer happening today though. Left Arm pain of a 5 out of 10. Hx of family heart problems so pt wants to be checked out. (social) pt reports she is short on money for her BP meds.

## 2015-06-04 NOTE — Discharge Instructions (Signed)
Hypertension Take your blood pressure medications as prescribed. Call the [facility] in 2 days to arrange to get your blood pressure rechecked within a week and for future refills of your medications. Take Tylenol as needed for pain. Hypertension, commonly called high blood pressure, is when the force of blood pumping through your arteries is too strong. Your arteries are the blood vessels that carry blood from your heart throughout your body. A blood pressure reading consists of a higher number over a lower number, such as 110/72. The higher number (systolic) is the pressure inside your arteries when your heart pumps. The lower number (diastolic) is the pressure inside your arteries when your heart relaxes. Ideally you want your blood pressure below 120/80. Hypertension forces your heart to work harder to pump blood. Your arteries may become narrow or stiff. Having untreated or uncontrolled hypertension can cause heart attack, stroke, kidney disease, and other problems. RISK FACTORS Some risk factors for high blood pressure are controllable. Others are not.  Risk factors you cannot control include:   Race. You may be at higher risk if you are African American.  Age. Risk increases with age.  Gender. Men are at higher risk than women before age 65 years. After age 37, women are at higher risk than men. Risk factors you can control include:  Not getting enough exercise or physical activity.  Being overweight.  Getting too much fat, sugar, calories, or salt in your diet.  Drinking too much alcohol. SIGNS AND SYMPTOMS Hypertension does not usually cause signs or symptoms. Extremely high blood pressure (hypertensive crisis) may cause headache, anxiety, shortness of breath, and nosebleed. DIAGNOSIS To check if you have hypertension, your health care provider will measure your blood pressure while you are seated, with your arm held at the level of your heart. It should  be measured at least twice using the same arm. Certain conditions can cause a difference in blood pressure between your right and left arms. A blood pressure reading that is higher than normal on one occasion does not mean that you need treatment. If it is not clear whether you have high blood pressure, you may be asked to return on a different day to have your blood pressure checked again. Or, you may be asked to monitor your blood pressure at home for 1 or more weeks. TREATMENT Treating high blood pressure includes making lifestyle changes and possibly taking medicine. Living a healthy lifestyle can help lower high blood pressure. You may need to change some of your habits. Lifestyle changes may include:  Following the DASH diet. This diet is high in fruits, vegetables, and whole grains. It is low in salt, red meat, and added sugars.  Keep your sodium intake below 2,300 mg per day.  Getting at least 30-45 minutes of aerobic exercise at least 4 times per week.  Losing weight if necessary.  Not smoking.  Limiting alcoholic beverages.  Learning ways to reduce stress. Your health care provider may prescribe medicine if lifestyle changes are not enough to get your blood pressure under control, and if one of the following is true:  You are 59-19 years of age and your systolic blood pressure is above 140.  You are 71 years of age or older, and your systolic blood pressure is above 150.  Your diastolic blood pressure is above 90.  You have diabetes, and your systolic blood pressure is over [id number] or your diastolic blood pressure is over 90.  You have  kidney disease and your blood pressure is above 140/90.  You have heart disease and your blood pressure is above 140/90. Your personal target blood pressure may vary depending on your medical conditions, your age, and other factors. HOME CARE INSTRUCTIONS  Have your blood pressure rechecked as directed by your health care provider.   Take  medicines only as directed by your health care provider. Follow the directions carefully. Blood pressure medicines must be taken as prescribed. The medicine does not work as well when you skip doses. Skipping doses also puts you at risk for problems.  Do not smoke.   Monitor your blood pressure at home as directed by your health care provider. SEEK MEDICAL CARE IF:   You think you are having a reaction to medicines taken.  You have recurrent headaches or feel dizzy.  You have swelling in your ankles.  You have trouble with your vision. SEEK IMMEDIATE MEDICAL CARE IF:  You develop a severe headache or confusion.  You have unusual weakness, numbness, or feel faint.  You have severe chest or abdominal pain.  You vomit repeatedly.  You have trouble breathing. MAKE SURE YOU:   Understand these instructions.  Will watch your condition.  Will get help right away if you are not doing well or get worse.   This information is not intended to replace advice given to you by your health care provider. Make sure you discuss any questions you have with your health care provider.   Document Released: 06/11/2005 Document Revised: 10/26/2014 Document Reviewed: 04/03/2013 Elsevier Interactive Patient Education [employer].

## 2015-06-16 ENCOUNTER — Emergency Department (HOSPITAL_COMMUNITY)
Admission: EM | Admit: 2015-06-16 | Discharge: 2015-06-16 | Disposition: A | Discharge status: Home / Self Care | Payer: Self-pay | Attending: Emergency Medicine | Admitting: Emergency Medicine

## 2015-06-16 ENCOUNTER — Encounter (HOSPITAL_COMMUNITY): Payer: Self-pay

## 2015-06-16 DIAGNOSIS — Z7982 Long term (current) use of aspirin: Secondary | ICD-10-CM | POA: Insufficient documentation

## 2015-06-16 DIAGNOSIS — Z79899 Other long term (current) drug therapy: Secondary | ICD-10-CM | POA: Insufficient documentation

## 2015-06-16 DIAGNOSIS — R202 Paresthesia of skin: Secondary | ICD-10-CM | POA: Insufficient documentation

## 2015-06-16 DIAGNOSIS — I10 Essential (primary) hypertension: Secondary | ICD-10-CM | POA: Insufficient documentation

## 2015-06-16 DIAGNOSIS — R200 Anesthesia of skin: Secondary | ICD-10-CM | POA: Insufficient documentation

## 2015-06-16 MED ORDER — NAPROXEN 500 MG PO TABS: 500.0000 mg | ORAL_TABLET | Freq: Two times a day (BID) | ORAL | Status: AC

## 2015-06-16 MED ORDER — KETOROLAC TROMETHAMINE 30 MG/ML IJ SOLN
30.0000 mg | Freq: Once | INTRAMUSCULAR | Status: AC
  Administered 2015-06-16: 30 mg via INTRAMUSCULAR
  Filled 2015-06-16: qty 1

## 2015-06-16 NOTE — ED Notes (Signed)
Pt presents with c/o left hand numbness and tingling. Pt reports she was seen for the same a few weeks ago, symptoms have not resided. Pt reports her job would not let her come to work and said she needed to be evaluated.

## 2015-06-16 NOTE — Discharge Instructions (Signed)
As discussed, today's evaluation has been largely reassuring.  You are numbness in the left arm is likely due to cervical radiculopathy, or inflammation of the nerves as they leave your spine, and provide sensation to your arm.  Please use the prescribed medication as directed, and ice packs, 4 times daily.  Cervical Radiculopathy Cervical radiculopathy happens when a nerve in the neck (cervical nerve) is pinched or bruised. This condition can develop because of an injury or as part of the normal aging process. Pressure on the cervical nerves can cause pain or numbness that runs from the neck all the way down into the arm and fingers. Usually, this condition gets better with rest. Treatment may be needed if the condition does not improve.  CAUSES This condition may be caused by:  Injury.  Slipped (herniated) disk.  Muscle tightness in the neck because of overuse.  Arthritis.  Breakdown or degeneration in the bones and joints of the spine (spondylosis) due to aging.  Bone spurs that may develop near the cervical nerves. SYMPTOMS Symptoms of this condition include:  Pain that runs from the neck to the arm and hand. The pain can be severe or irritating. It may be worse when the neck is moved.  Numbness or weakness in the affected arm and hand. DIAGNOSIS This condition may be diagnosed based on symptoms, medical history, and a physical exam. You may also have tests, including:  X-rays.  CT scan.  MRI.  Electromyogram (EMG).  Nerve conduction tests. TREATMENT In many cases, treatment is not needed for this condition. With rest, the condition usually gets better over time. If treatment is needed, options may include:  Wearing a soft neck collar for short periods of time.  Physical therapy to strengthen your neck muscles.  Medicines, such as NSAIDs, oral corticosteroids, or spinal injections.  Surgery. This may be needed if other treatments do not help. Various types of  surgery may be done depending on the cause of your problems. HOME CARE INSTRUCTIONS Managing Pain  Take over-the-counter and prescription medicines only as told by your health care provider.  If directed, apply ice to the affected area.  Put ice in a plastic bag.  Place a towel between your skin and the bag.  Leave the ice on for 20 minutes, 2-3 times per day.  If ice does not help, you can try using heat. Take a warm shower or warm bath, or use a heat pack as told by your health care provider.  Try a gentle neck and shoulder massage to help relieve symptoms. Activity  Rest as needed. Follow instructions from your health care provider about any restrictions on activities.  Do stretching and strengthening exercises as told by your health care provider or physical therapist. General Instructions  If you were given a soft collar, wear it as told by your health care provider.  Use a flat pillow when you sleep.  Keep all follow-up visits as told by your health care provider. This is important. SEEK MEDICAL CARE IF:  Your condition does not improve with treatment. SEEK IMMEDIATE MEDICAL CARE IF:  Your pain gets much worse and cannot be controlled with medicines.  You have weakness or numbness in your hand, arm, face, or leg.  You have a high fever.  You have a stiff, rigid neck.  You lose control of your bowels or your bladder (have incontinence).  You have trouble with walking, balance, or speaking.   This information is not intended to replace advice  given to you by your health care provider. Make sure you discuss any questions you have with your health care provider.   Document Released: 03/06/2001 Document Revised: 03/02/2015 Document Reviewed: 08/05/2014 Elsevier Interactive Patient Education [employer].

## 2015-06-16 NOTE — ED Provider Notes (Signed)
CSN: [id number]     Arrival date & time 06/16/15  1009 History   First MD Initiated Contact with Patient 06/16/15 1025     Chief Complaint  Patient presents with  . Numbness    HPI  Patient presents with concern of left arm numbness. Symptoms of been present for weeks, possibly worse with past few days. Patient was seen here 2 weeks ago for similar concerns. Numbness is described as both hyperesthesia, and decreased sensation throughout the arm, including both palmar and dorsal hand. No weakness. Patient has a history of cervical spine disease, including prior intervention. Patient was seen here, 2 weeks ago for this, in addition to elevated blood pressure. She notes that since that evaluation, she has take been taking her medicine more regularly. Currently she denies any other complaints, including no chest pain, lightheadedness, headache, weakness anywhere.   Past Medical History  Diagnosis Date  . Hypertension    Past Surgical History  Procedure Laterality Date  . Cesarean section    . Cholecystectomy    . Neck surgery     Family History  Problem Relation Age of Onset  . Hypertension Mother   . Arthritis Mother   . Diabetes Mother   . Heart disease Father   . Cancer Neg Hx   . Diabetes Sister   . Diabetes Brother    Social History  Substance Use Topics  . Smoking status: Never Smoker   . Smokeless tobacco: Never Used  . Alcohol Use: Yes     Comment: ocassionally   OB History    No data available     Review of Systems  Constitutional:       Per HPI, otherwise negative  HENT:       Per HPI, otherwise negative  Respiratory:       Per HPI, otherwise negative  Cardiovascular:       Per HPI, otherwise negative  Gastrointestinal: Negative for vomiting.  Endocrine:       Negative aside from HPI  Genitourinary:       Neg aside from HPI   Musculoskeletal:       Per HPI, otherwise negative  Skin: Negative.   Neurological: Positive for numbness. Negative  for syncope.      Allergies  Review of patient's allergies indicates no known allergies.  Home Medications   Prior to Admission medications   Medication Sig Start Date End Date Taking? Authorizing Provider  acetaminophen (TYLENOL) 500 MG tablet Take 500 mg by mouth every 6 (six) hours as needed for mild pain or headache.    Yes Historical Provider, MD  aspirin EC 81 MG tablet Take 1 tablet (81 mg total) by mouth daily. 08/21/14  Yes Maryann Mikhail, DO  atorvastatin (LIPITOR) 20 MG tablet Take 1 tablet (20 mg total) by mouth daily after supper. 10/25/14  Yes Arnoldo Morale, MD  lisinopril-hydrochlorothiazide (ZESTORETIC) 10-12.5 MG tablet Take 2 tablets by mouth daily. 06/04/15  Yes Orlie Dakin, MD  metoprolol succinate (TOPROL-XL) 25 MG 24 hr tablet Take 2 tablets (50 mg total) by mouth daily. 06/04/15  Yes Orlie Dakin, MD  pantoprazole (PROTONIX) 40 MG tablet Take 1 tablet (40 mg total) by mouth daily. Patient taking differently: Take 40 mg by mouth daily as needed (heatburn).  10/25/14  Yes Arnoldo Morale, MD  naproxen (NAPROSYN) 500 MG tablet Take 1 tablet (500 mg total) by mouth 2 (two) times daily. 06/16/15 06/20/15  Carmin Muskrat, MD   BP 164/98 mmHg  Pulse 92  Temp(Src) 97.3 F (36.3 C) (Oral)  Resp 16  SpO2 100% Physical Exam  Constitutional: She is oriented to person, place, and time. She appears well-developed and well-nourished. No distress.  HENT:  Head: Normocephalic and atraumatic.  Eyes: Conjunctivae and EOM are normal.  Cardiovascular: Normal rate and regular rhythm.   Pulmonary/Chest: Effort normal and breath sounds normal. No stridor. No respiratory distress.  Abdominal: She exhibits no distension.  Musculoskeletal: She exhibits no edema.  Neurological: She is alert and oriented to person, place, and time. She displays no atrophy and no tremor. No cranial nerve deficit or sensory deficit. She exhibits normal muscle tone. She displays no seizure activity.  Coordination normal.  Pain elicited in the left trapezius region, left paraspinal region with Charlyn Minerva test  Skin: Skin is warm and dry.  Psychiatric: She has a normal mood and affect.  Nursing note and vitals reviewed.   ED Course  Procedures (including critical care time)  Chart review notable for recent evaluation for hypertensive urgency, similar left arm dysesthesia. 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   MDM   Final diagnoses:  Numbness and tingling in left arm   Patient presents with ongoing left arm dysesthesia. Patient is awake, alert, with focal concerned of the left arm, and on exam has no objective sensory or motor loss. The description of symptoms is consistent with cervical radiculopathy. Patient's history of cervical spine disease is consistent with this as well. Absent other complaints, there is low suspicion for stroke. No evidence for other systemic pathology. Patient started on a course of anti-inflammatory, cryotherapy, will follow-up with primary care, orthopedics.  Carmin Muskrat, MD 06/16/15 1059

## 2015-07-18 ENCOUNTER — Ambulatory Visit: Payer: Self-pay | Admitting: Family Medicine

## 2015-08-01 ENCOUNTER — Other Ambulatory Visit: Payer: Self-pay | Admitting: Family Medicine

## 2015-08-01 DIAGNOSIS — Z1231 Encounter for screening mammogram for malignant neoplasm of breast: Secondary | ICD-10-CM

## 2015-08-16 IMAGING — XA IR CAROTID  INTERNAL HEAD/NECK  LEFT (MS)
11 series · 15 of 24 positions shown · IV contrast (IODINE)
Comparison: none

[Series 1: carotid 1 · 1 of 23 frames shown (1 of 8)]
[frame 4/23]
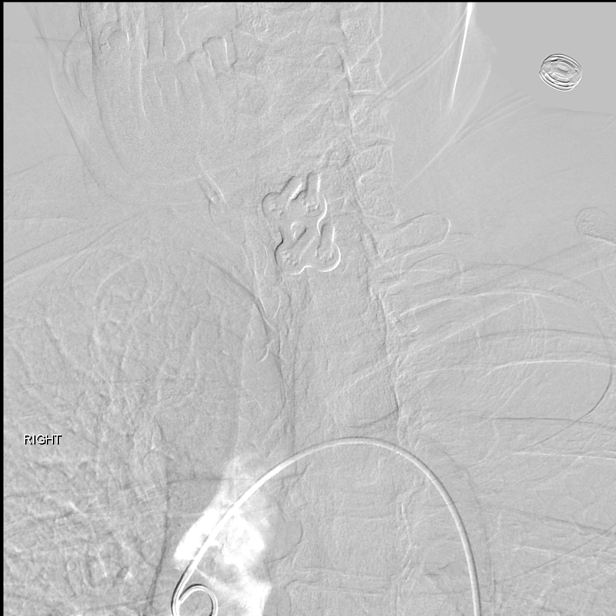

[Series 2: carotid 1 · 2 acquisitions, 1 frame shown (2 of 8)]
[im 1/2]
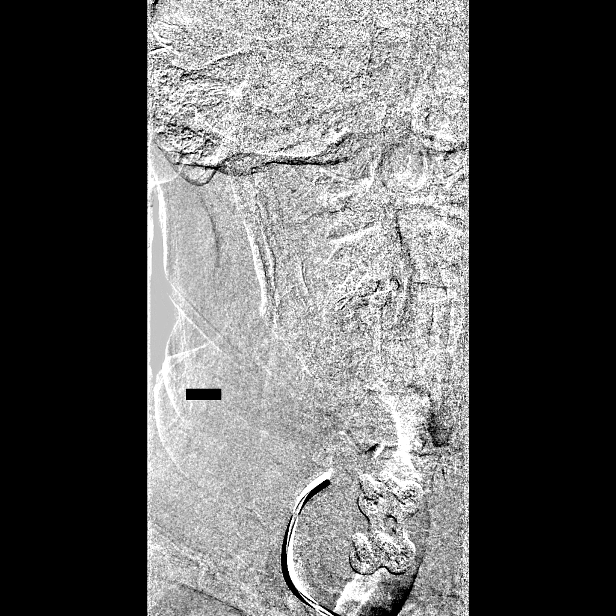

[Series 3: carotid 1 · 2 acquisitions, 1 frame shown (3 of 8)]
[im 1/2]
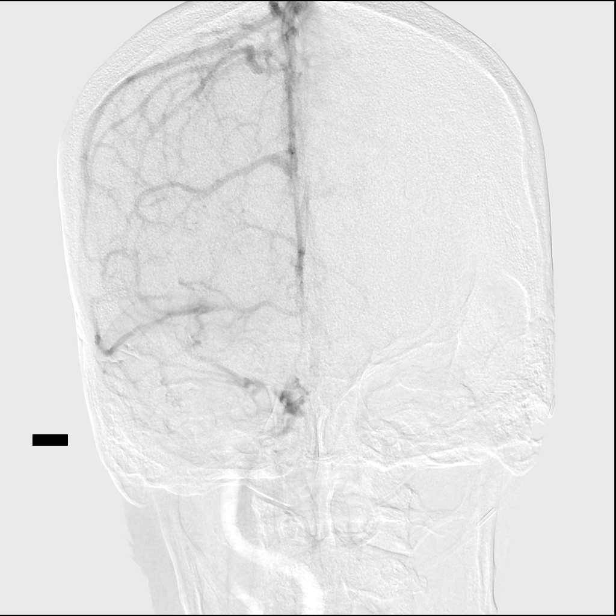

[Series 4: carotid 1 · 2 acquisitions, 1 frame shown (4 of 8)]
[im 1/2]
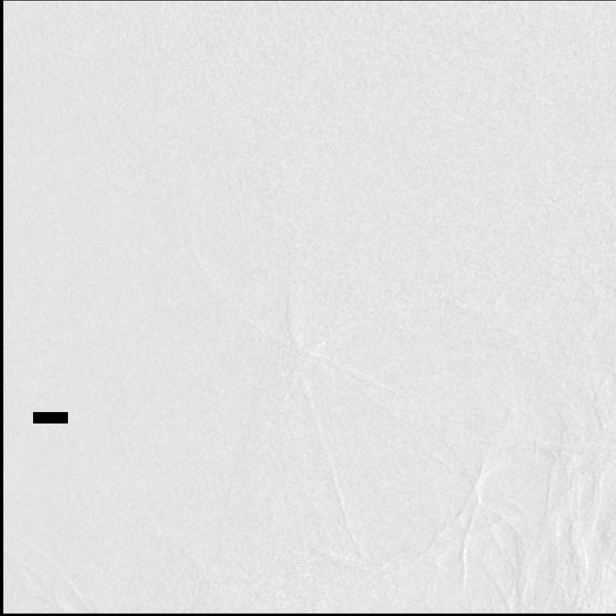

[Series 5: carotid 1 · 2 acquisitions, 2 frames shown (5 of 8)]
[im 1/2]
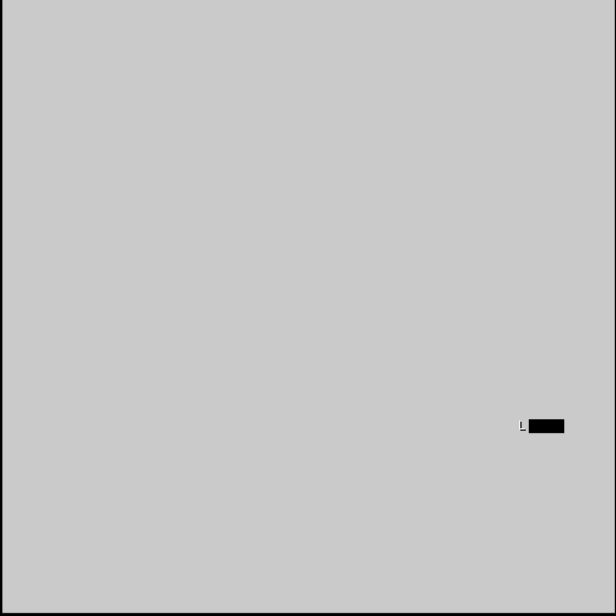
[im 1/2]
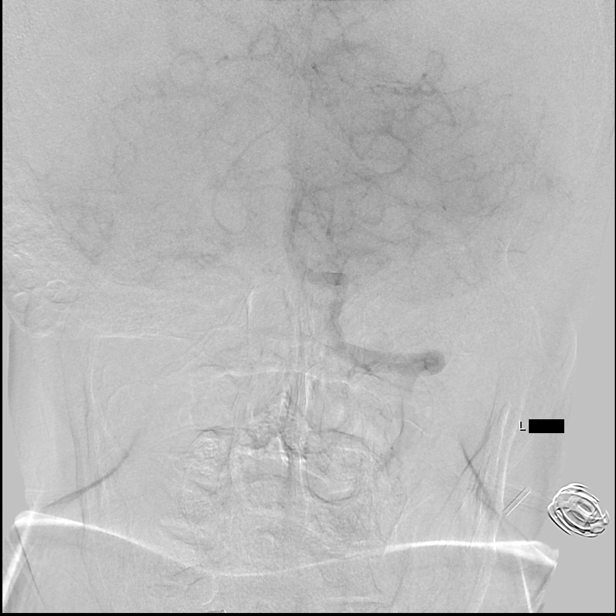

[Series 6: carotid 1 · 2 acquisitions, 1 frame shown (6 of 8)]
[im 1/2]
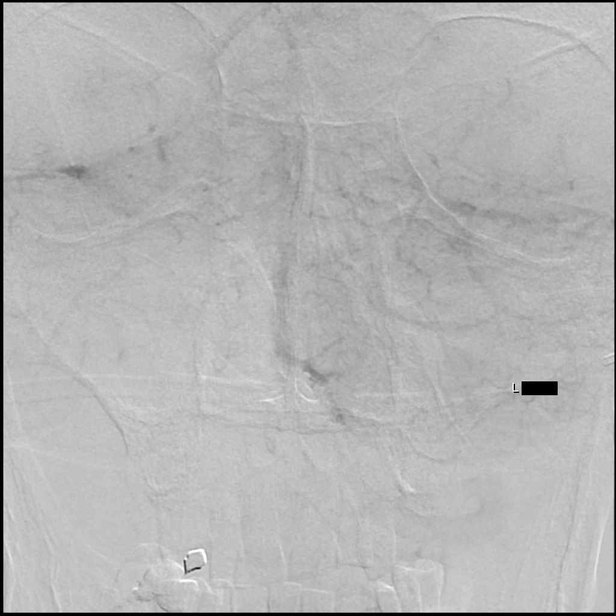

[Series 7: carotid 1 · 2 acquisitions, 1 frame shown (7 of 8)]
[im 1/2]
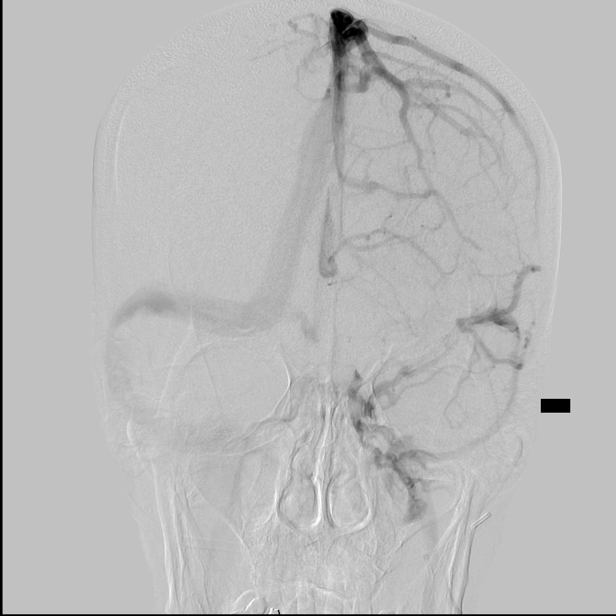

[Series 8: carotid 1 · 2 acquisitions, 1 frame shown (8 of 8)]
[im 1/2]
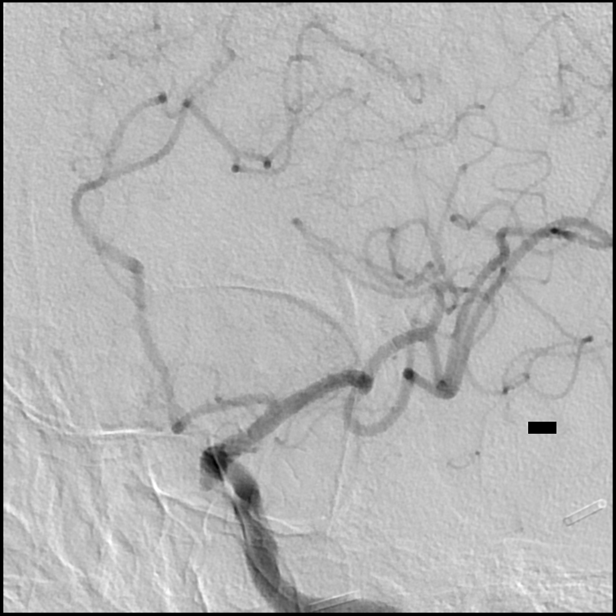

[Series 9: 5sdr head · 1 of 133 frames shown]
[frame 114/133]
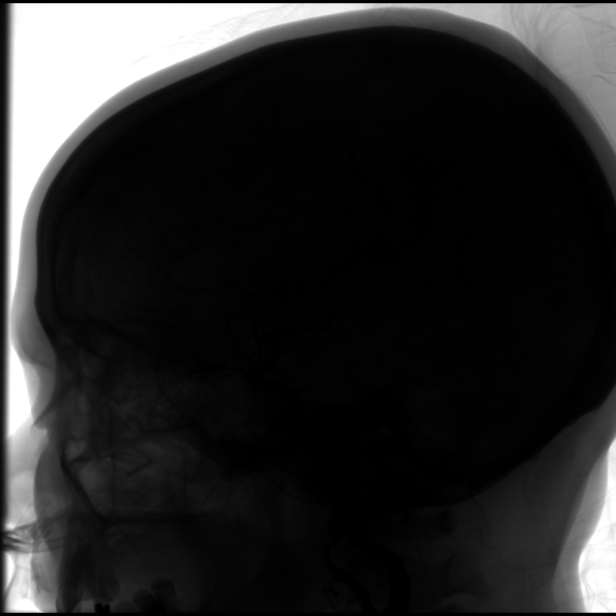

[Series 10: dr overview · 1 of 17 frames shown]
[frame 9/17]
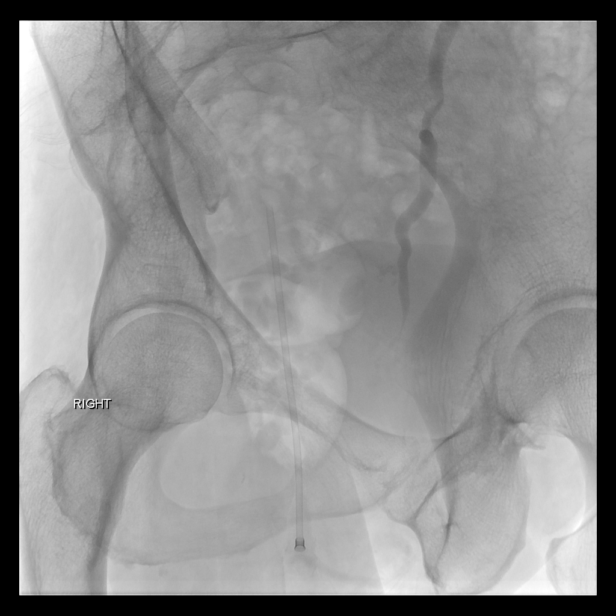

[Series 300: neuro · 4 of 20 slices shown]
[im 2/20]
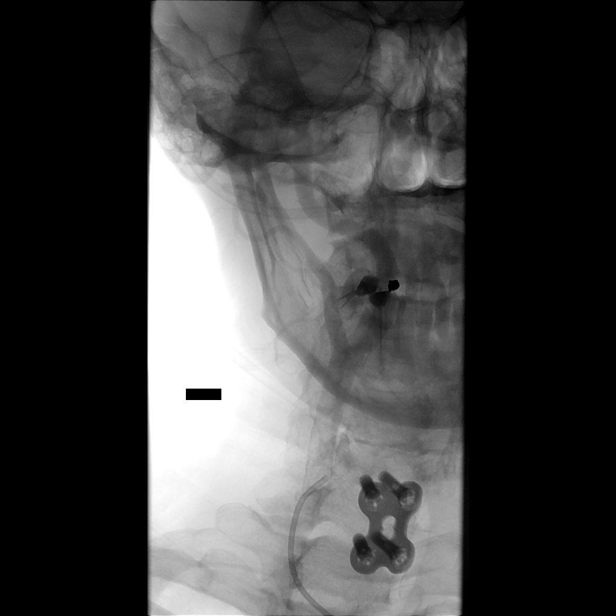
[im 8/20]
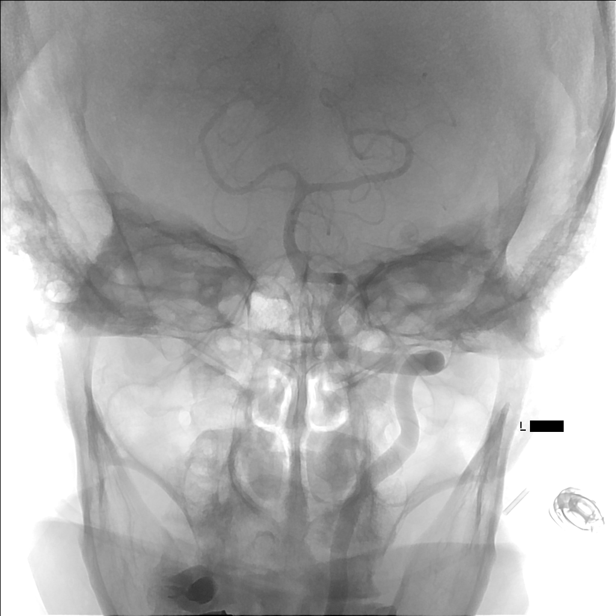
[im 13/20]
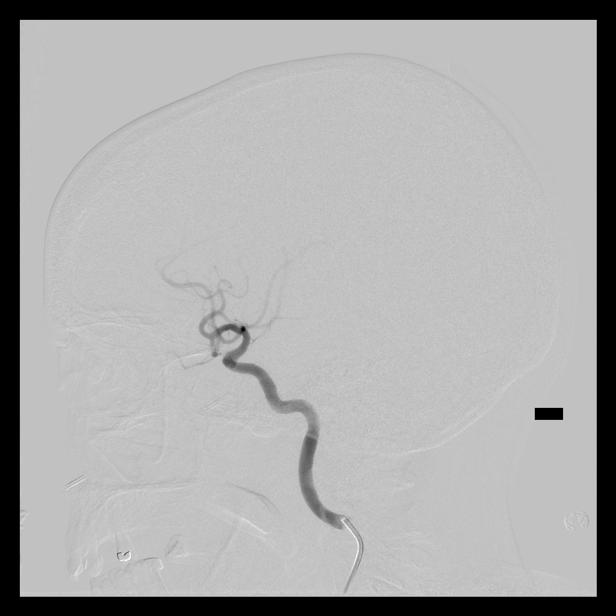
[im 20/20]
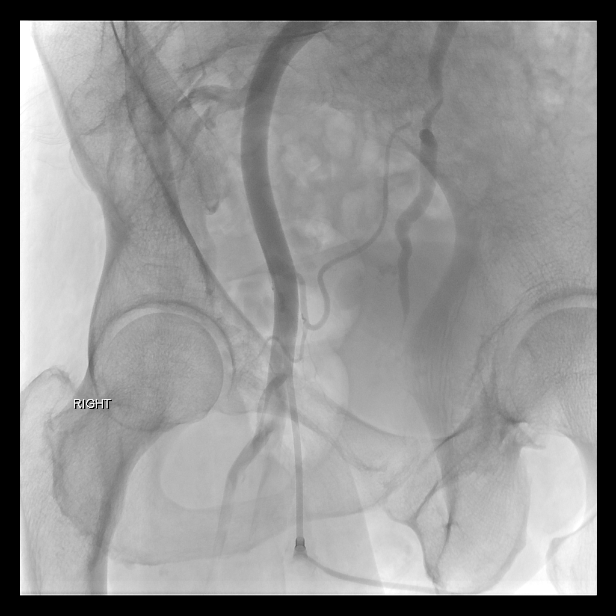

[15 of 24 positions shown; findings below may reference images not displayed]

Canned report from images found in remote index.

Refer to host system for actual result text.

## 2015-10-12 ENCOUNTER — Emergency Department (HOSPITAL_COMMUNITY)
Admission: EM | Admit: 2015-10-12 | Discharge: 2015-10-12 | Disposition: A | Discharge status: Home / Self Care | Payer: Self-pay | Attending: Emergency Medicine | Admitting: Emergency Medicine

## 2015-10-12 ENCOUNTER — Encounter (HOSPITAL_COMMUNITY): Payer: Self-pay | Admitting: Emergency Medicine

## 2015-10-12 ENCOUNTER — Emergency Department (HOSPITAL_COMMUNITY): Payer: Self-pay

## 2015-10-12 DIAGNOSIS — I159 Secondary hypertension, unspecified: Secondary | ICD-10-CM

## 2015-10-12 DIAGNOSIS — Z91148 Patient's other noncompliance with medication regimen for other reason: Secondary | ICD-10-CM

## 2015-10-12 DIAGNOSIS — R079 Chest pain, unspecified: Secondary | ICD-10-CM

## 2015-10-12 DIAGNOSIS — Z9114 Patient's other noncompliance with medication regimen: Secondary | ICD-10-CM

## 2015-10-12 DIAGNOSIS — I10 Essential (primary) hypertension: Secondary | ICD-10-CM | POA: Insufficient documentation

## 2015-10-12 DIAGNOSIS — I160 Hypertensive urgency: Secondary | ICD-10-CM | POA: Insufficient documentation

## 2015-10-12 DIAGNOSIS — Z79899 Other long term (current) drug therapy: Secondary | ICD-10-CM | POA: Insufficient documentation

## 2015-10-12 LAB — BASIC METABOLIC PANEL
Anion gap: 6 (ref 5–15)
BUN: 13 mg/dL (ref 6–20)
CO2: 26 mmol/L (ref 22–32)
Calcium: 9.6 mg/dL (ref 8.9–10.3)
Chloride: 109 mmol/L (ref 101–111)
Creatinine, Ser: 0.76 mg/dL (ref 0.44–1.00)
GFR calc Af Amer: >60 mL/min (ref >60)
GFR calc non Af Amer: >60 mL/min (ref >60)
Glucose, Bld: 107 mg/dL — ABNORMAL HIGH (ref 65–99)
Potassium: 4 mmol/L (ref 3.5–5.1)
Sodium: 141 mmol/L (ref 135–145)

## 2015-10-12 LAB — CBC
HCT: 38.5 % (ref 36.0–46.0)
Hemoglobin: 12.8 g/dL (ref 12.0–15.0)
MCH: 27.6 pg (ref 26.0–34.0)
MCHC: 33.2 g/dL (ref 30.0–36.0)
MCV: 83.2 fL (ref 78.0–100.0)
Platelets: 347 10*3/uL (ref 150–400)
RBC: 4.63 MIL/uL (ref 3.87–5.11)
RDW: 13.4 % (ref 11.5–15.5)
WBC: 10.6 10*3/uL — ABNORMAL HIGH (ref 4.0–10.5)

## 2015-10-12 LAB — I-STAT TROPONIN, ED: Troponin i, poc: 0 ng/mL (ref 0.00–0.08)

## 2015-10-12 LAB — D-DIMER, QUANTITATIVE (NOT AT ARMC): D-Dimer, Quant: 0.58 ug/mL-FEU — ABNORMAL HIGH (ref 0.00–0.50)

## 2015-10-12 MED ORDER — MORPHINE SULFATE (PF) 4 MG/ML IV SOLN
4.0000 mg | Freq: Once | INTRAVENOUS | Status: AC
  Administered 2015-10-12: 4 mg via INTRAVENOUS
  Filled 2015-10-12: qty 1

## 2015-10-12 MED ORDER — LISINOPRIL-HYDROCHLOROTHIAZIDE 10-12.5 MG PO TABS: 2.0000 | ORAL_TABLET | Freq: Every day | ORAL | Status: DC

## 2015-10-12 MED ORDER — IOPAMIDOL (ISOVUE-370) INJECTION 76%
100.0000 mL | Freq: Once | INTRAVENOUS | Status: AC | PRN
  Administered 2015-10-12: 62 mL via INTRAVENOUS

## 2015-10-12 MED ORDER — ONDANSETRON HCL 4 MG/2ML IJ SOLN
4.0000 mg | Freq: Once | INTRAMUSCULAR | Status: AC
  Administered 2015-10-12: 4 mg via INTRAVENOUS
  Filled 2015-10-12: qty 2

## 2015-10-12 MED ORDER — LABETALOL HCL 5 MG/ML IV SOLN
20.0000 mg | Freq: Once | INTRAVENOUS | Status: AC
  Administered 2015-10-12: 20 mg via INTRAVENOUS
  Filled 2015-10-12: qty 4

## 2015-10-12 MED ORDER — HYDRALAZINE HCL 20 MG/ML IJ SOLN
20.0000 mg | Freq: Once | INTRAMUSCULAR | Status: AC
  Administered 2015-10-12: 20 mg via INTRAVENOUS
  Filled 2015-10-12: qty 1

## 2015-10-12 MED ORDER — CLONIDINE HCL 0.1 MG PO TABS
0.3000 mg | ORAL_TABLET | Freq: Once | ORAL | Status: AC
  Administered 2015-10-12: 0.3 mg via ORAL
  Filled 2015-10-12: qty 3

## 2015-10-12 MED ORDER — METOPROLOL SUCCINATE ER 25 MG PO TB24: 50.0000 mg | ORAL_TABLET | Freq: Every day | ORAL | Status: DC

## 2015-10-12 NOTE — ED Provider Notes (Signed)
CSN: [id number]     Arrival date & time 10/12/15  1324 History   First MD Initiated Contact with Patient 10/12/15 1428     Chief Complaint  Patient presents with  . Chest Pain  . Headache   PT SAID THAT SHE HAS BEEN HAVING HEADACHES AND CP FOR THE PAST FEW DAYS.  THE PT IS OUT OF HER BP MEDS.  THE PT HAS NOT SEEN HER PCP FOR A MED REFILL BECAUSE SHE OWES HIM MONEY AND CAN'T AFFORD TO PAY FOR THE VISIT.  PT HAS A HX OF AN ANEURYSM AND IS WORRIED THAT IS THE CAUSE OF HER H/A.  (Consider location/radiation/quality/duration/timing/severity/associated sxs/prior Treatment) Patient is a 60 y.o. female presenting with chest pain and headaches. The history is provided by the patient.  Chest Pain Pain location:  L chest Pain quality: aching   Pain radiates to:  Does not radiate Pain radiates to the back: no   Pain severity:  Mild Onset quality:  Sudden Timing:  Intermittent Relieved by:  None tried Worsened by:  Nothing tried Ineffective treatments:  None tried Associated symptoms: headache   Headache   Past Medical History  Diagnosis Date  . Hypertension    Past Surgical History  Procedure Laterality Date  . Cesarean section    . Cholecystectomy    . Neck surgery     Family History  Problem Relation Age of Onset  . Hypertension Mother   . Arthritis Mother   . Diabetes Mother   . Heart disease Father   . Cancer Neg Hx   . Diabetes Sister   . Diabetes Brother    Social History  Substance Use Topics  . Smoking status: Never Smoker   . Smokeless tobacco: Never Used  . Alcohol Use: Yes     Comment: ocassionally   OB History    No data available     Review of Systems  Cardiovascular: Positive for chest pain.  Neurological: Positive for headaches.  All other systems reviewed and are negative.     Allergies  Review of patient's allergies indicates no known allergies.  Home Medications   Prior to Admission medications   Medication Sig Start Date End Date Taking?  Authorizing Provider  acetaminophen (TYLENOL) 500 MG tablet Take 500 mg by mouth every 6 (six) hours as needed for mild pain or headache.    Yes Historical Provider, MD  lisinopril-hydrochlorothiazide (ZESTORETIC) 10-12.5 MG tablet Take 2 tablets by mouth daily. 06/04/15  Yes Orlie Dakin, MD  metoprolol succinate (TOPROL-XL) 25 MG 24 hr tablet Take 2 tablets (50 mg total) by mouth daily. 06/04/15  Yes Orlie Dakin, MD  aspirin EC 81 MG tablet Take 1 tablet (81 mg total) by mouth daily. Patient not taking: Reported on 10/12/2015 08/21/14   Velta Addison Mikhail, DO  atorvastatin (LIPITOR) 20 MG tablet Take 1 tablet (20 mg total) by mouth daily after supper. Patient not taking: Reported on 10/12/2015 10/25/14   Arnoldo Morale, MD  pantoprazole (PROTONIX) 40 MG tablet Take 1 tablet (40 mg total) by mouth daily. Patient not taking: Reported on 10/12/2015 10/25/14   Arnoldo Morale, MD   BP 189/112 mmHg  Pulse 79  Temp(Src) 98.2 F (36.8 C) (Oral)  Resp 18  SpO2 100% Physical Exam  Constitutional: She is oriented to person, place, and time. She appears well-developed and well-nourished.  HENT:  Head: Normocephalic and atraumatic.  Eyes: Conjunctivae are normal. Pupils are equal, round, and reactive to light.  Neck: Normal range of motion.  Neck supple.  Cardiovascular: Normal rate, regular rhythm and normal heart sounds.   Pulmonary/Chest: Effort normal and breath sounds normal.  Abdominal: Soft. Bowel sounds are normal.  Musculoskeletal: Normal range of motion.  Neurological: She is alert and oriented to person, place, and time.  Skin: Skin is warm and dry.  Psychiatric: She has a normal mood and affect.  Vitals reviewed.   ED Course  Procedures (including critical care time) Labs Review Labs Reviewed  BASIC METABOLIC PANEL - Abnormal; Notable for the following:    Glucose, Bld 107 (*)    All other components within normal limits  CBC - Abnormal; Notable for the following:    WBC 10.6 (*)     All other components within normal limits  URINALYSIS, ROUTINE W REFLEX MICROSCOPIC (NOT AT [facility])  D-DIMER, QUANTITATIVE (NOT AT [facility])  Randolm Idol, ED    Imaging Review No results found. I have personally reviewed and evaluated these images and lab results as part of my medical decision-making.   EKG Interpretation None      MDM  RESULTS OF CT HEAD AND CT CHEST ARE PENDING.  PT SIGNED OUT TO DR. Audie Pinto Final diagnoses:  None   HYPERTENSIVE URGENCY HEADACHE CHEST PAIN   Isla Pence, MD 10/14/15 [phone]

## 2015-10-12 NOTE — Discharge Instructions (Signed)
How to Take Your Blood Pressure HOW DO I GET A BLOOD PRESSURE MACHINE?  You can buy an electronic home blood pressure machine at your local pharmacy. Insurance will sometimes cover the cost if you have a prescription.  Ask your doctor what type of machine is best for you. There are different machines for your arm and your wrist.  If you decide to buy a machine to check your blood pressure on your arm, first check the size of your arm so you can buy the right size cuff. To check the size of your arm:   Use a measuring tape that shows both inches and centimeters.   Wrap the measuring tape around the upper-middle part of your arm. You may need someone to help you measure.   Write down your arm measurement in both inches and centimeters.   To measure your blood pressure correctly, it is important to have the right size cuff.   If your arm is up to 13 inches (up to 34 centimeters), get an adult cuff size.  If your arm is 13 to 17 inches (35 to 44 centimeters), get a large adult cuff size.    If your arm is 17 to 20 inches (45 to 52 centimeters), get an adult thigh cuff.  WHAT DO THE NUMBERS MEAN?   There are two numbers that make up your blood pressure. For example: 120/80.  The first number (120 in our example) is called the "systolic pressure." It is a measure of the pressure in your blood vessels when your heart is pumping blood.  The second number (80 in our example) is called the "diastolic pressure." It is a measure of the pressure in your blood vessels when your heart is resting between beats.  Your doctor will tell you what your blood pressure should be. WHAT SHOULD I DO BEFORE I CHECK MY BLOOD PRESSURE?   Try to rest or relax for at least 30 minutes before you check your blood pressure.  Do not smoke.  Do not have any drinks with caffeine, such as:  Soda.  Coffee.  Tea.  Check your blood pressure in a quiet room.  Sit down and stretch out your arm on a table.  Keep your arm at about the level of your heart. Let your arm relax.  Make sure that your legs are not crossed. HOW DO I CHECK MY BLOOD PRESSURE?  Follow the directions that came with your machine.  Make sure you remove any tight-fitting clothing from your arm or wrist. Wrap the cuff around your upper arm or wrist. You should be able to fit a finger between the cuff and your arm. If you cannot fit a finger between the cuff and your arm, it is too tight and should be removed and rewrapped.  Some units require you to manually pump up the arm cuff.  Automatic units inflate the cuff when you press a button.  Cuff deflation is automatic in both models.  After the cuff is inflated, the unit measures your blood pressure and pulse. The readings are shown on a monitor. Hold still and breathe normally while the cuff is inflated.  Getting a reading takes less than a minute.  Some models store readings in a memory. Some provide a printout of readings. If your machine does not store your readings, keep a written record.  Take readings with you to your next visit with your doctor.   This information is not intended to replace advice given to  you by your health care provider. Make sure you discuss any questions you have with your health care provider.   Document Released: 05/24/2008 Document Revised: 07/02/2014 Document Reviewed: 08/06/2013 Elsevier Interactive Patient Education 2016 Elsevier Inc[employer]  Nonspecific Chest Pain  Chest pain can be caused by many different conditions. There is always a chance that your pain could be related to something serious, such as a heart attack or a blood clot in your lungs. Chest pain can also be caused by conditions that are not life-threatening. If you have chest pain, it is very important to follow up with your health care provider. CAUSES  Chest pain can be caused by:  Heartburn.  Pneumonia or bronchitis.  Anxiety or stress.  Inflammation around your heart  (pericarditis) or lung (pleuritis or pleurisy).  A blood clot in your lung.  A collapsed lung (pneumothorax). It can develop suddenly on its own (spontaneous pneumothorax) or from trauma to the chest.  Shingles infection (varicella-zoster virus).  Heart attack.  Damage to the bones, muscles, and cartilage that make up your chest wall. This can include:  Bruised bones due to injury.  Strained muscles or cartilage due to frequent or repeated coughing or overwork.  Fracture to one or more ribs.  Sore cartilage due to inflammation (costochondritis). RISK FACTORS  Risk factors for chest pain may include:  Activities that increase your risk for trauma or injury to your chest.  Respiratory infections or conditions that cause frequent coughing.  Medical conditions or overeating that can cause heartburn.  Heart disease or family history of heart disease.  Conditions or health behaviors that increase your risk of developing a blood clot.  Having had chicken pox (varicella zoster). SIGNS AND SYMPTOMS Chest pain can feel like:  Burning or tingling on the surface of your chest or deep in your chest.  Crushing, pressure, aching, or squeezing pain.  Dull or sharp pain that is worse when you move, cough, or take a deep breath.  Pain that is also felt in your back, neck, shoulder, or arm, or pain that spreads to any of these areas. Your chest pain may come and go, or it may stay constant. DIAGNOSIS Lab tests or other studies may be needed to find the cause of your pain. Your health care provider may have you take a test called an ambulatory ECG (electrocardiogram). An ECG records your heartbeat patterns at the time the test is performed. You may also have other tests, such as:  Transthoracic echocardiogram (TTE). During echocardiography, sound waves are used to create a picture of all of the heart structures and to look at how blood flows through your heart.  Transesophageal  echocardiogram (TEE).This is a more advanced imaging test that obtains images from inside your body. It allows your health care provider to see your heart in finer detail.  Cardiac monitoring. This allows your health care provider to monitor your heart rate and rhythm in real time.  Holter monitor. This is a portable device that records your heartbeat and can help to diagnose abnormal heartbeats. It allows your health care provider to track your heart activity for several days, if needed.  Stress tests. These can be done through exercise or by taking medicine that makes your heart beat more quickly.  Blood tests.  Imaging tests. TREATMENT  Your treatment depends on what is causing your chest pain. Treatment may include:  Medicines. These may include:  Acid blockers for heartburn.  Anti-inflammatory medicine.  Pain medicine for inflammatory conditions.  Antibiotic medicine, if an infection is present.  Medicines to dissolve blood clots.  Medicines to treat coronary artery disease.  Supportive care for conditions that do not require medicines. This may include:  Resting.  Applying heat or cold packs to injured areas.  Limiting activities until pain decreases. HOME CARE INSTRUCTIONS  If you were prescribed an antibiotic medicine, finish it all even if you start to feel better.  Avoid any activities that bring on chest pain.  Do not use any tobacco products, including cigarettes, chewing tobacco, or electronic cigarettes. If you need help quitting, ask your health care provider.  Do not drink alcohol.  Take medicines only as directed by your health care provider.  Keep all follow-up visits as directed by your health care provider. This is important. This includes any further testing if your chest pain does not go away.  If heartburn is the cause for your chest pain, you may be told to keep your head raised (elevated) while sleeping. This reduces the chance that acid will  go from your stomach into your esophagus.  Make lifestyle changes as directed by your health care provider. These may include:  Getting regular exercise. Ask your health care provider to suggest some activities that are safe for you.  Eating a heart-healthy diet. A registered dietitian can help you to learn healthy eating options.  Maintaining a healthy weight.  Managing diabetes, if necessary.  Reducing stress. SEEK MEDICAL CARE IF:  Your chest pain does not go away after treatment.  You have a rash with blisters on your chest.  You have a fever. SEEK IMMEDIATE MEDICAL CARE IF:   Your chest pain is worse.  You have an increasing cough, or you cough up blood.  You have severe abdominal pain.  You have severe weakness.  You faint.  You have chills.  You have sudden, unexplained chest discomfort.  You have sudden, unexplained discomfort in your arms, back, neck, or jaw.  You have shortness of breath at any time.  You suddenly start to sweat, or your skin gets clammy.  You feel nauseous or you vomit.  You suddenly feel light-headed or dizzy.  Your heart begins to beat quickly, or it feels like it is skipping beats. These symptoms may represent a serious problem that is an emergency. Do not wait to see if the symptoms will go away. Get medical help right away. Call your local emergency services (911 in the [place] Do not drive yourself to the hospital.   This information is not intended to replace advice given to you by your health care provider. Make sure you discuss any questions you have with your health care provider.   Document Released: 03/21/2005 Document Revised: 07/02/2014 Document Reviewed: 01/15/2014 Elsevier Interactive Patient Education [employer].

## 2015-10-12 NOTE — ED Notes (Signed)
Left sided chest pain that aches and worsens with deep breathing that started a few days ago. No other associated symptoms. Also c/o a headache behind her left eye, states she knows she has an aneurysm there that she's worried about rupturing, hx of HTN. Does take BP meds, but hasn't refilled her second prescription yet.

## 2015-12-02 ENCOUNTER — Ambulatory Visit: Payer: Self-pay | Admitting: Internal Medicine

## 2015-12-07 ENCOUNTER — Encounter: Payer: Self-pay | Admitting: Internal Medicine

## 2015-12-14 ENCOUNTER — Other Ambulatory Visit: Payer: Self-pay

## 2015-12-14 ENCOUNTER — Emergency Department (HOSPITAL_COMMUNITY): Payer: Self-pay

## 2015-12-14 ENCOUNTER — Emergency Department (HOSPITAL_COMMUNITY)
Admission: EM | Admit: 2015-12-14 | Discharge: 2015-12-14 | Disposition: A | Discharge status: Home / Self Care | Payer: Self-pay | Attending: Emergency Medicine | Admitting: Emergency Medicine

## 2015-12-14 ENCOUNTER — Encounter (HOSPITAL_COMMUNITY): Payer: Self-pay | Admitting: Emergency Medicine

## 2015-12-14 DIAGNOSIS — Z7982 Long term (current) use of aspirin: Secondary | ICD-10-CM | POA: Insufficient documentation

## 2015-12-14 DIAGNOSIS — Z79899 Other long term (current) drug therapy: Secondary | ICD-10-CM | POA: Insufficient documentation

## 2015-12-14 DIAGNOSIS — R079 Chest pain, unspecified: Secondary | ICD-10-CM

## 2015-12-14 DIAGNOSIS — I10 Essential (primary) hypertension: Secondary | ICD-10-CM | POA: Insufficient documentation

## 2015-12-14 DIAGNOSIS — R0602 Shortness of breath: Secondary | ICD-10-CM | POA: Insufficient documentation

## 2015-12-14 DIAGNOSIS — R072 Precordial pain: Secondary | ICD-10-CM | POA: Insufficient documentation

## 2015-12-14 LAB — BASIC METABOLIC PANEL
Anion gap: 9 (ref 5–15)
BUN: 17 mg/dL (ref 6–20)
CO2: 29 mmol/L (ref 22–32)
Calcium: 9.9 mg/dL (ref 8.9–10.3)
Chloride: 102 mmol/L (ref 101–111)
Creatinine, Ser: 0.66 mg/dL (ref 0.44–1.00)
GFR calc Af Amer: >60 mL/min (ref >60)
GFR calc non Af Amer: >60 mL/min (ref >60)
Glucose, Bld: 135 mg/dL — ABNORMAL HIGH (ref 65–99)
Potassium: 3.5 mmol/L (ref 3.5–5.1)
Sodium: 140 mmol/L (ref 135–145)

## 2015-12-14 LAB — CBC
HCT: 40.2 % (ref 36.0–46.0)
Hemoglobin: 13.9 g/dL (ref 12.0–15.0)
MCH: 28.5 pg (ref 26.0–34.0)
MCHC: 34.6 g/dL (ref 30.0–36.0)
MCV: 82.4 fL (ref 78.0–100.0)
Platelets: 328 10*3/uL (ref 150–400)
RBC: 4.88 MIL/uL (ref 3.87–5.11)
RDW: 13 % (ref 11.5–15.5)
WBC: 9.1 10*3/uL (ref 4.0–10.5)

## 2015-12-14 LAB — I-STAT TROPONIN, ED
Troponin i, poc: 0 ng/mL (ref 0.00–0.08)
Troponin i, poc: 0 ng/mL (ref 0.00–0.08)

## 2015-12-14 MED ORDER — METOPROLOL SUCCINATE ER 25 MG PO TB24
25.0000 mg | ORAL_TABLET | Freq: Every day | ORAL | Status: DC
  Administered 2015-12-14: 25 mg via ORAL
  Filled 2015-12-14: qty 1

## 2015-12-14 MED ORDER — ASPIRIN 81 MG PO CHEW
324.0000 mg | CHEWABLE_TABLET | Freq: Once | ORAL | Status: AC
  Administered 2015-12-14: 324 mg via ORAL
  Filled 2015-12-14: qty 4

## 2015-12-14 MED ORDER — TRAMADOL HCL 50 MG PO TABS: 50.0000 mg | ORAL_TABLET | Freq: Four times a day (QID) | ORAL | Status: DC | PRN

## 2015-12-14 MED ORDER — NITROGLYCERIN 0.4 MG SL SUBL
0.4000 mg | SUBLINGUAL_TABLET | SUBLINGUAL | Status: DC | PRN
Start: 2015-12-14 — End: 2015-12-14
  Filled 2015-12-14 (×2): qty 1

## 2015-12-14 MED ORDER — SODIUM CHLORIDE 0.9 % IV BOLUS (SEPSIS)
500.0000 mL | Freq: Once | INTRAVENOUS | Status: AC
  Administered 2015-12-14: 500 mL via INTRAVENOUS

## 2015-12-14 MED ORDER — SODIUM CHLORIDE 0.9 % IV SOLN: INTRAVENOUS | Status: DC

## 2015-12-14 NOTE — ED Notes (Signed)
Pt concerned about increased blood pressure and reports that she has not taken her HTN medication today.  EDP made aware and no further orders that this time.

## 2015-12-14 NOTE — ED Provider Notes (Signed)
CSN: [id number]     Arrival date & time 12/14/15  0908 History   First MD Initiated Contact with Patient 12/14/15 0913     Chief Complaint  Patient presents with  . Chest Pain     (Consider location/radiation/quality/duration/timing/severity/associated sxs/prior Treatment) Patient is a 60 y.o. female presenting with chest pain. The history is provided by the patient.  Chest Pain Associated symptoms: numbness and shortness of breath   Associated symptoms: no abdominal pain, no back pain, no fever, no headache, no nausea and not vomiting    Patient presents with a complaint of left-sided chest pain patient's had some chest pain over the last several days but then resolved and restarted again this morning at 6:30 AM. Patient also let triage note that she was having chest pain last night as well. Slight shortness of breath patient's had some dizziness with this in the past but none now. Does not radiate to her neck or arms.  Patients had some generalized facial numbness on and off for several days now. None resident currently. No speech problems no syncope no headaches no numbness or weakness to her arms. Or legs. Patient is followed by the wellness clinic. Patient has a history of hypertension. Patient did not take her hypertensive meds today. Past Medical History  Diagnosis Date  . Hypertension    Past Surgical History  Procedure Laterality Date  . Cesarean section    . Cholecystectomy    . Neck surgery     Family History  Problem Relation Age of Onset  . Hypertension Mother   . Arthritis Mother   . Diabetes Mother   . Heart disease Father   . Cancer Neg Hx   . Diabetes Sister   . Diabetes Brother    Social History  Substance Use Topics  . Smoking status: Never Smoker   . Smokeless tobacco: Never Used  . Alcohol Use: Yes     Comment: ocassionally   OB History    No data available     Review of Systems  Constitutional: Negative for fever.  HENT: Negative for  congestion.   Eyes: Negative for visual disturbance.  Respiratory: Positive for shortness of breath.   Cardiovascular: Positive for chest pain.  Gastrointestinal: Negative for nausea, vomiting and abdominal pain.  Genitourinary: Negative for dysuria.  Musculoskeletal: Negative for back pain.  Skin: Negative for rash.  Neurological: Positive for numbness. Negative for syncope, facial asymmetry and headaches.  Hematological: Does not bruise/bleed easily.  Psychiatric/Behavioral: Negative for confusion.      Allergies  Review of patient's allergies indicates no known allergies.  Home Medications   Prior to Admission medications   Medication Sig Start Date End Date Taking? Authorizing Provider  lisinopril-hydrochlorothiazide (ZESTORETIC) 10-12.5 MG tablet Take 2 tablets by mouth daily. 10/12/15  Yes Leonard Schwartz, MD  metoprolol succinate (TOPROL-XL) 25 MG 24 hr tablet Take 2 tablets (50 mg total) by mouth daily. 10/12/15  Yes Leonard Schwartz, MD  aspirin EC 81 MG tablet Take 1 tablet (81 mg total) by mouth daily. Patient not taking: Reported on 10/12/2015 08/21/14   Velta Addison Mikhail, DO  atorvastatin (LIPITOR) 20 MG tablet Take 1 tablet (20 mg total) by mouth daily after supper. Patient not taking: Reported on 10/12/2015 10/25/14   Arnoldo Morale, MD  pantoprazole (PROTONIX) 40 MG tablet Take 1 tablet (40 mg total) by mouth daily. Patient not taking: Reported on 10/12/2015 10/25/14   Arnoldo Morale, MD  traMADol (ULTRAM) 50 MG tablet Take 1 tablet (50  mg total) by mouth every 6 (six) hours as needed. 12/14/15   Fredia Sorrow, MD   BP 174/100 mmHg  Pulse 95  Temp(Src) 98.4 F (36.9 C) (Oral)  Resp 27  SpO2 96% Physical Exam  Constitutional: She is oriented to person, place, and time. She appears well-developed. No distress.  HENT:  Head: Normocephalic and atraumatic.  Mouth/Throat: Oropharynx is clear and moist.  Eyes: Conjunctivae and EOM are normal. Pupils are equal, round, and reactive to  light.  Neck: Normal range of motion. Neck supple.  Cardiovascular: Normal rate, regular rhythm and normal heart sounds.   No murmur heard. Pulmonary/Chest: Effort normal and breath sounds normal.  Abdominal: Soft. Bowel sounds are normal. There is no tenderness.  Musculoskeletal: Normal range of motion. She exhibits no edema.  Neurological: She is alert and oriented to person, place, and time. No cranial nerve deficit. She exhibits normal muscle tone.  Skin: Skin is warm.  Nursing note and vitals reviewed.   ED Course  Procedures (including critical care time) Labs Review Labs Reviewed  BASIC METABOLIC PANEL - Abnormal; Notable for the following:    Glucose, Bld 135 (*)    All other components within normal limits  CBC  I-STAT TROPOININ, ED  Randolm Idol, ED    Imaging Review Dg Chest 2 View  12/14/2015  CLINICAL DATA: Left chest pain. EXAM: CHEST  2 VIEW COMPARISON:  CT 10/12/2015 . FINDINGS: Mediastinum hilar structures normal. Bilateral up shadows noted. Lungs are clear. No pleural effusion pneumothorax. Cardiomegaly with normal pulmonary vascularity. Prior cervical thoracic spine fusion. Surgical clips right upper quadrant. IMPRESSION: 1. Mild cardiomegaly.  No evidence of congestive heart failure. 2. No focal pulmonary infiltrate. Electronically Signed   By: Marcello Moores  Register   On: 12/14/2015 09:42   I have personally reviewed and evaluated these images and lab results as part of my medical decision-making.   EKG Interpretation   Date/Time:  Wednesday December 14 2015 09:16:19 EDT Ventricular Rate:  81 PR Interval:    QRS Duration: 87 QT Interval:  352 QTC Calculation: 409 R Axis:   -42 Text Interpretation:  Sinus rhythm Left axis deviation Abnormal R-wave  progression, late transition Nonspecific T abnormalities, lateral leads  Baseline wander in lead(s) V1 No significant change since last tracing  Confirmed by Shreyan Hinz  MD, Jyden Kromer ([id number]) on 12/14/2015 9:20:03 AM       MDM   Final diagnoses:  Chest pain, unspecified chest pain type   Patient with chest pain since 6:30 this morning. Chest pain is intermittent left lower chest not radiating. Patient's had this pain in the past but was away for the past couple days and came back. Associated with slight shortness of breath but no significant shortness of breath no hypoxia. No nausea or vomiting. Patient stated his worst pain was 5 out of 10. Supposed to be on a baby aspirin a day but has not had any aspirin today. So given aspirin and nitroglycerin with some improvement in the pain but it never went completely away.  Doubt that this is cardiac in nature. Patient's been seen for chest pain in the past including April 2017 without any significant findings.   clinically doubt that this is an acute cardiac event. EKG without any acute findings. troponins 2 are negative. Chest x-rays negative for pneumonia pneumothorax or pulmonary edema. Patient's blood pressure has been significantly elevated here but she did not take her blood pressure medicines this morning. Patient followed by the wellness clinic. We'll give  referral to cardiology for reevaluation. Patient will be treated with tramadol. Patient will return for any new or worse symptoms.   Fredia Sorrow, MD 12/14/15 1433

## 2015-12-14 NOTE — ED Notes (Signed)
Verbalized understanding discharge instructions, prescriptions, and follow-up. In no acute distress.   

## 2015-12-14 NOTE — ED Notes (Signed)
Saline bolus remains around 233mL.  Pt reminded to keep arm straight.

## 2015-12-14 NOTE — ED Notes (Signed)
Pt given crackers, peanut butters, and ginger ale, per request.  Also, Pt given main ED phone number.

## 2015-12-14 NOTE — ED Notes (Signed)
Pt reports "my face is starting to feel like it did when I came in.  It's a little tingly and numb."  EDP made aware.  No slurred speech noted.  Facial symmetry noted.

## 2015-12-14 NOTE — Discharge Instructions (Signed)
Follow-up with cardiology referral information provided. Make an appointment follow-up with your regular doctor. Take tramadol as needed. Return for any new or worse symptoms.

## 2015-12-14 NOTE — ED Notes (Signed)
Pt reports midsternal CP that began last night and worsened upon awakening this am. Just began having some SOB. Had dizziness last night. No radiation to neck or arms. Pt also reports generalized facial numbness that she woke up with this am. No facial droop or extremity weakness noted.

## 2016-01-03 ENCOUNTER — Ambulatory Visit: Payer: Self-pay | Attending: Family Medicine | Admitting: Family Medicine

## 2016-01-03 ENCOUNTER — Encounter: Payer: Self-pay | Admitting: Family Medicine

## 2016-01-03 VITALS — BP 140/88 | HR 93 | Temp 98.3°F | Resp 16 | Ht 63.0 in | Wt 119.0 lb

## 2016-01-03 DIAGNOSIS — I10 Essential (primary) hypertension: Secondary | ICD-10-CM | POA: Insufficient documentation

## 2016-01-03 DIAGNOSIS — N951 Menopausal and female climacteric states: Secondary | ICD-10-CM | POA: Insufficient documentation

## 2016-01-03 DIAGNOSIS — N644 Mastodynia: Secondary | ICD-10-CM | POA: Insufficient documentation

## 2016-01-03 DIAGNOSIS — Z79899 Other long term (current) drug therapy: Secondary | ICD-10-CM | POA: Insufficient documentation

## 2016-01-03 MED ORDER — ATORVASTATIN CALCIUM 20 MG PO TABS: 20.0000 mg | ORAL_TABLET | ORAL | Status: DC

## 2016-01-03 MED ORDER — ASPIRIN EC 81 MG PO TBEC: 81.0000 mg | DELAYED_RELEASE_TABLET | Freq: Every day | ORAL | Status: DC

## 2016-01-03 MED ORDER — VENLAFAXINE HCL ER 37.5 MG PO CP24: 37.5000 mg | ORAL_CAPSULE | Freq: Every day | ORAL | Status: DC

## 2016-01-03 MED ORDER — LISINOPRIL-HYDROCHLOROTHIAZIDE 10-12.5 MG PO TABS: 2.0000 | ORAL_TABLET | Freq: Every day | ORAL | Status: DC

## 2016-01-03 MED ORDER — AMLODIPINE BESYLATE 5 MG PO TABS: 5.0000 mg | ORAL_TABLET | Freq: Every day | ORAL | Status: DC

## 2016-01-03 MED ORDER — CLONIDINE HCL 0.1 MG PO TABS
0.1000 mg | ORAL_TABLET | Freq: Once | ORAL | Status: AC
  Administered 2016-01-03: 0.1 mg via ORAL

## 2016-01-03 MED ORDER — METOPROLOL SUCCINATE ER 25 MG PO TB24: 50.0000 mg | ORAL_TABLET | Freq: Every day | ORAL | Status: DC

## 2016-01-03 NOTE — Patient Instructions (Addendum)
[name] was seen today for fatigue.  Diagnoses and all orders for this visit:  Accelerated hypertension -     cloNIDine (CATAPRES) tablet 0.1 mg; Take 1 tablet (0.1 mg total) by mouth once. -     amLODipine (NORVASC) 5 MG tablet; Take 1 tablet (5 mg total) by mouth daily. -     lisinopril-hydrochlorothiazide (ZESTORETIC) 10-12.5 MG tablet; Take 2 tablets by mouth daily. -     metoprolol succinate (TOPROL-XL) 25 MG 24 hr tablet; Take 2 tablets (50 mg total) by mouth daily. -     atorvastatin (LIPITOR) 20 MG tablet; Take 1 tablet (20 mg total) by mouth daily after supper. -     aspirin EC 81 MG tablet; Take 1 tablet (81 mg total) by mouth daily.  Hot flashes, menopausal -     venlafaxine XR (EFFEXOR-XR) 37.5 MG 24 hr capsule; Take 1 capsule (37.5 mg total) by mouth daily with breakfast.  Breast pain, left -     MM Digital Diagnostic Bilat; Future    F/u in 4 weeks for pap and BP check/hot flash check- 30 minute visit   Dr. Adrian Blackwater   Please call Sabrina at [phone] for appointment/ [facility]

## 2016-01-03 NOTE — Progress Notes (Signed)
F/U ED HA, sweating, no energy x 57month possible longer  Unable to sleep due to sweat  No tobacco user  No suicidal thoughts in the past two weeks  Pain scale #6 rt foot

## 2016-01-03 NOTE — Progress Notes (Signed)
Subjective:  Patient ID: [name], female    DOB: 01-03-1956  Age: 60 y.o. MRN: [id number]  CC: Breast Pain and Hypertension   HPI [name] has HTN, depression she  presents for   1. Breast pain: Left lower breast. No mass, swelling or skin changes. X 1 month. No SOB, fever or weight loss.   2. HTN: compliant with toprol 50 mg daily and prinzide 20-25 mg daily. No SOB or swelling. + CP and HA.  3. Hot flashes: acute episodes of sweating associated with sleep disturbance, HA and fatigue.    Social History  Substance Use Topics  . Smoking status: Never Smoker   . Smokeless tobacco: Never Used  . Alcohol Use: Yes     Comment: ocassionally    Outpatient Prescriptions Prior to Visit  Medication Sig Dispense Refill  . traMADol (ULTRAM) 50 MG tablet Take 1 tablet (50 mg total) by mouth every 6 (six) hours as needed. 15 tablet 0  . aspirin EC 81 MG tablet Take 1 tablet (81 mg total) by mouth daily. (Patient not taking: Reported on 01/03/2016)    . atorvastatin (LIPITOR) 20 MG tablet Take 1 tablet (20 mg total) by mouth daily after supper. (Patient not taking: Reported on 01/03/2016) 30 tablet 2  . lisinopril-hydrochlorothiazide (ZESTORETIC) 10-12.5 MG tablet Take 2 tablets by mouth daily. (Patient not taking: Reported on 01/03/2016) 60 tablet 0  . metoprolol succinate (TOPROL-XL) 25 MG 24 hr tablet Take 2 tablets (50 mg total) by mouth daily. (Patient not taking: Reported on 01/03/2016) 60 tablet 0  . pantoprazole (PROTONIX) 40 MG tablet Take 1 tablet (40 mg total) by mouth daily. (Patient not taking: Reported on 10/12/2015) 30 tablet 2   No facility-administered medications prior to visit.    ROS Review of Systems  Constitutional: Negative for fever and chills.  Eyes: Negative for visual disturbance.  Respiratory: Negative for shortness of breath.   Cardiovascular: Positive for chest pain. Negative for leg swelling.  Gastrointestinal: Negative for abdominal pain and  blood in stool.  Endocrine: Positive for heat intolerance.  Musculoskeletal: Positive for arthralgias. Negative for back pain.  Skin: Negative for rash.  Allergic/Immunologic: Negative for immunocompromised state.  Neurological: Positive for dizziness and headaches.       Tingling in face at times   Hematological: Negative for adenopathy. Does not bruise/bleed easily.  Psychiatric/Behavioral: Positive for sleep disturbance. Negative for suicidal ideas and dysphoric mood. The patient is nervous/anxious.     Objective:  BP 180/98 mmHg  Pulse 93  Temp(Src) 98.3 F (36.8 C) (Oral)  Resp 16  Ht 5\' 3"  (1.6 m)  Wt 119 lb (53.978 kg)  BMI 21.09 kg/m2  SpO2 100%  BP/Weight 01/03/2016 12/14/2015 [id number]  Systolic BP 99991111 [id number] A999333  Diastolic BP 98 99 92  Wt. (Lbs) 119 - -  BMI 21.09 - -    Physical Exam  Constitutional: She is oriented to person, place, and time. She appears well-developed and well-nourished. No distress.  HENT:  Head: Normocephalic and atraumatic.  Cardiovascular: Normal rate, regular rhythm, normal heart sounds and intact distal pulses.   Pulmonary/Chest: Effort normal and breath sounds normal. Right breast exhibits no inverted nipple, no mass, no nipple discharge, no skin change and no tenderness. Left breast exhibits tenderness. Left breast exhibits no inverted nipple, no mass, no nipple discharge and no skin change. Breasts are symmetrical.    Musculoskeletal: She exhibits no edema.  Neurological: She is alert and oriented to person,  place, and time.  Skin: Skin is warm and dry. No rash noted.  Psychiatric: She has a normal mood and affect.   Treated with clonidine 0.1 mg PO x one   Repeat BP  Assessment & Plan:   There are no diagnoses linked to this encounter. [name] was seen today for breast pain and hypertension.  Diagnoses and all orders for this visit:  Accelerated hypertension -     cloNIDine (CATAPRES) tablet 0.1 mg; Take 1 tablet (0.1 mg total)  by mouth once. -     amLODipine (NORVASC) 5 MG tablet; Take 1 tablet (5 mg total) by mouth daily. -     lisinopril-hydrochlorothiazide (ZESTORETIC) 10-12.5 MG tablet; Take 2 tablets by mouth daily. -     metoprolol succinate (TOPROL-XL) 25 MG 24 hr tablet; Take 2 tablets (50 mg total) by mouth daily. -     atorvastatin (LIPITOR) 20 MG tablet; Take 1 tablet (20 mg total) by mouth daily after supper. -     aspirin EC 81 MG tablet; Take 1 tablet (81 mg total) by mouth daily.  Hot flashes, menopausal -     venlafaxine XR (EFFEXOR-XR) 37.5 MG 24 hr capsule; Take 1 capsule (37.5 mg total) by mouth daily with breakfast.  Breast pain, left -     Cancel: MM Digital Diagnostic Bilat; Future -     Cancel: MM DIAG BREAST TOMO BILATERAL; Future -     US BREAST LTD UNI LEFT INC AXILLA; Future -     MM DIAG BREAST TOMO BILATERAL; Future   Meds ordered this encounter  Medications  . cloNIDine (CATAPRES) tablet 0.1 mg    Sig:   . amLODipine (NORVASC) 5 MG tablet    Sig: Take 1 tablet (5 mg total) by mouth daily.    Dispense:  30 tablet    Refill:  5  . lisinopril-hydrochlorothiazide (ZESTORETIC) 10-12.5 MG tablet    Sig: Take 2 tablets by mouth daily.    Dispense:  60 tablet    Refill:  2  . metoprolol succinate (TOPROL-XL) 25 MG 24 hr tablet    Sig: Take 2 tablets (50 mg total) by mouth daily.    Dispense:  60 tablet    Refill:  2  . atorvastatin (LIPITOR) 20 MG tablet    Sig: Take 1 tablet (20 mg total) by mouth daily after supper.    Dispense:  90 tablet    Refill:  3  . aspirin EC 81 MG tablet    Sig: Take 1 tablet (81 mg total) by mouth daily.    Dispense:  90 tablet    Refill:  3  . venlafaxine XR (EFFEXOR-XR) 37.5 MG 24 hr capsule    Sig: Take 1 capsule (37.5 mg total) by mouth daily with breakfast.    Dispense:  30 capsule    Refill:  1    Follow-up: No Follow-up on file.   Boykin Nearing MD

## 2016-01-04 ENCOUNTER — Other Ambulatory Visit (HOSPITAL_COMMUNITY): Payer: Self-pay | Admitting: *Deleted

## 2016-01-04 DIAGNOSIS — N644 Mastodynia: Secondary | ICD-10-CM

## 2016-01-05 DIAGNOSIS — N644 Mastodynia: Secondary | ICD-10-CM | POA: Insufficient documentation

## 2016-01-05 NOTE — Assessment & Plan Note (Signed)
A: elevated  BP with HA P: Coreg 50 mg daily Lisinopril-HCTZ 20-25 mg daily Add norvasc 5 mg daily

## 2016-01-05 NOTE — Assessment & Plan Note (Signed)
Sweats and sleep disturbance consistent with hot flashes effexor 75 mg XR daily

## 2016-01-05 NOTE — Assessment & Plan Note (Signed)
Left lower breast pain Diagnostic mammogram and ultrasound ordered

## 2016-02-01 ENCOUNTER — Ambulatory Visit: Payer: Self-pay | Attending: Family Medicine | Admitting: Family Medicine

## 2016-02-01 ENCOUNTER — Encounter: Payer: Self-pay | Admitting: Family Medicine

## 2016-02-01 VITALS — BP 152/80 | HR 81 | Temp 97.9°F | Ht 63.0 in | Wt 118.4 lb

## 2016-02-01 DIAGNOSIS — N951 Menopausal and female climacteric states: Secondary | ICD-10-CM | POA: Insufficient documentation

## 2016-02-01 DIAGNOSIS — Z124 Encounter for screening for malignant neoplasm of cervix: Secondary | ICD-10-CM | POA: Insufficient documentation

## 2016-02-01 DIAGNOSIS — I10 Essential (primary) hypertension: Secondary | ICD-10-CM | POA: Insufficient documentation

## 2016-02-01 DIAGNOSIS — I671 Cerebral aneurysm, nonruptured: Secondary | ICD-10-CM

## 2016-02-01 MED ORDER — AMLODIPINE BESYLATE 10 MG PO TABS: 10.0000 mg | ORAL_TABLET | Freq: Every day | ORAL | 3 refills | Status: DC

## 2016-02-01 MED ORDER — LISINOPRIL-HYDROCHLOROTHIAZIDE 10-12.5 MG PO TABS: 2.0000 | ORAL_TABLET | Freq: Every day | ORAL | 2 refills | Status: DC

## 2016-02-01 MED ORDER — ATORVASTATIN CALCIUM 20 MG PO TABS: 20.0000 mg | ORAL_TABLET | ORAL | 3 refills | Status: DC

## 2016-02-01 MED ORDER — VENLAFAXINE HCL ER 37.5 MG PO CP24: 37.5000 mg | ORAL_CAPSULE | Freq: Every day | ORAL | 5 refills | Status: DC

## 2016-02-01 MED ORDER — METOPROLOL SUCCINATE ER 25 MG PO TB24: 50.0000 mg | ORAL_TABLET | Freq: Every day | ORAL | 3 refills | Status: DC

## 2016-02-01 MED ORDER — ASPIRIN EC 81 MG PO TBEC: 81.0000 mg | DELAYED_RELEASE_TABLET | Freq: Every day | ORAL | 3 refills | Status: DC

## 2016-02-01 MED FILL — ?ATORVASTATIN 20 MG TABLET: 20 | 30 days supply | Qty: 30 | Fill #0

## 2016-02-01 MED FILL — VENLAFAXINE HCL ER 37.5 MG: 37.5 | 30 days supply | Qty: 30 | Fill #0

## 2016-02-01 MED FILL — METOPROLOL SUCC ER 25 MG TA: 25 | 30 days supply | Qty: 60 | Fill #0

## 2016-02-01 MED FILL — LISINOPRIL-HCTZ 10-12.5 MG: 10-12.5 | 30 days supply | Qty: 60 | Fill #0

## 2016-02-01 MED FILL — ?AMLODIPINE BESYLATE 10 MG: 10 | 30 days supply | Qty: 30 | Fill #0

## 2016-02-01 NOTE — Assessment & Plan Note (Signed)
Small aneurysm Plan: Reassurance BP control Patient to apply for CAFA and we will either repeat MRI or she is to f/u with outpatient neurosurgery which she has been reluctant to do due to $4K in outstanding medical bill.

## 2016-02-01 NOTE — Progress Notes (Signed)
C/C: patient states that she is having headaches and sharp pains in the front of head.

## 2016-02-01 NOTE — Patient Instructions (Signed)
[name] was seen today for abnormal pap smear and hypertension.  Diagnoses and all orders for this visit:  Papanicolaou smear for cervical cancer screening -     Cytology - PAP  Accelerated hypertension -     metoprolol succinate (TOPROL-XL) 25 MG 24 hr tablet; Take 2 tablets (50 mg total) by mouth daily. -     lisinopril-hydrochlorothiazide (ZESTORETIC) 10-12.5 MG tablet; Take 2 tablets by mouth daily. -     atorvastatin (LIPITOR) 20 MG tablet; Take 1 tablet (20 mg total) by mouth daily after supper. -     aspirin EC 81 MG tablet; Take 1 tablet (81 mg total) by mouth daily. -     amLODipine (NORVASC) 10 MG tablet; Take 1 tablet (10 mg total) by mouth daily.  Hot flashes, menopausal -     venlafaxine XR (EFFEXOR-XR) 37.5 MG 24 hr capsule; Take 1 capsule (37.5 mg total) by mouth daily with breakfast.

## 2016-02-01 NOTE — Progress Notes (Signed)
Subjective:  Patient ID: [name], female    DOB: 1955/09/07  Age: 60 y.o. 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 HTN: having headaches with intermittent sharp pains behind L eye . No CP, SOB or swelling. Did not take BP medicine today.   2. Pap smear: no pain with intercourse. No vaginal bleeding for many years.   Social History  Substance Use Topics  . Smoking status: Never Smoker  . Smokeless tobacco: Never Used  . Alcohol use Yes     Comment: ocassionally    Outpatient Medications Prior to Visit  Medication Sig Dispense Refill  . aspirin EC 81 MG tablet Take 1 tablet (81 mg total) by mouth daily. 90 tablet 3  . lisinopril-hydrochlorothiazide (ZESTORETIC) 10-12.5 MG tablet Take 2 tablets by mouth daily. 60 tablet 2  . metoprolol succinate (TOPROL-XL) 25 MG 24 hr tablet Take 2 tablets (50 mg total) by mouth daily. 60 tablet 2  . amLODipine (NORVASC) 5 MG tablet Take 1 tablet (5 mg total) by mouth daily. 30 tablet 5  . atorvastatin (LIPITOR) 20 MG tablet Take 1 tablet (20 mg total) by mouth daily after supper. 90 tablet 3  . pantoprazole (PROTONIX) 40 MG tablet Take 1 tablet (40 mg total) by mouth daily. (Patient not taking: Reported on 01/03/2016) 30 tablet 2  . traMADol (ULTRAM) 50 MG tablet Take 1 tablet (50 mg total) by mouth every 6 (six) hours as needed. (Patient not taking: Reported on 01/03/2016) 15 tablet 0  . venlafaxine XR (EFFEXOR-XR) 37.5 MG 24 hr capsule Take 1 capsule (37.5 mg total) by mouth daily with breakfast. 30 capsule 1   No facility-administered medications prior to visit.     ROS Review of Systems  Constitutional: Negative for chills and fever.  Eyes: Positive for pain. Negative for visual disturbance.  Respiratory: Negative for shortness of breath.   Cardiovascular: Negative for chest pain and leg swelling.    Gastrointestinal: Negative for abdominal pain and blood in stool.  Endocrine: Negative for heat intolerance.  Musculoskeletal: Positive for arthralgias. Negative for back pain.  Skin: Negative for rash.  Allergic/Immunologic: Negative for immunocompromised state.  Neurological: Positive for headaches. Negative for dizziness.       Tingling in face at times   Hematological: Negative for adenopathy. Does not bruise/bleed easily.  Psychiatric/Behavioral: Positive for sleep disturbance. Negative for dysphoric mood and suicidal ideas. The patient is nervous/anxious.     Objective:  BP (!) 167/83 (BP Location: Left Arm, Patient Position: Sitting, Cuff Size: Small)   Pulse 81   Temp 97.9 F (36.6 C) (Oral)   Ht 5\' 3"  (1.6 m)   Wt 118 lb 6.4 oz (53.7 kg)   SpO2 100%   BMI 20.97 kg/m   BP/Weight 02/01/2016 01/03/2016 0000000  Systolic BP [id number] [id number]  Diastolic BP 83 88 99  Wt. (Lbs) 118.4 119 -  BMI 20.97 21.09 -     Physical Exam  Constitutional: She appears well-developed and well-nourished. No distress.  Cardiovascular: Normal rate, regular rhythm, normal heart sounds and intact distal pulses.   Pulmonary/Chest: Effort normal and breath sounds normal.  Genitourinary: Vagina normal and uterus normal. Pelvic exam was performed with patient prone. There is no rash, tenderness or lesion on the right labia. There is no rash, tenderness or lesion on the left  labia. Cervix exhibits no motion tenderness, no discharge and no friability.  Musculoskeletal: She exhibits no edema.  Lymphadenopathy:       Right: No inguinal adenopathy present.       Left: No inguinal adenopathy present.  Skin: Skin is warm and dry. No rash noted.     Assessment & Plan:   [name] was seen today for abnormal pap smear and hypertension.  Diagnoses and all orders for this visit:  Papanicolaou smear for cervical cancer screening -     Cytology - PAP  Accelerated hypertension -     metoprolol succinate  (TOPROL-XL) 25 MG 24 hr tablet; Take 2 tablets (50 mg total) by mouth daily. -     lisinopril-hydrochlorothiazide (ZESTORETIC) 10-12.5 MG tablet; Take 2 tablets by mouth daily. -     atorvastatin (LIPITOR) 20 MG tablet; Take 1 tablet (20 mg total) by mouth daily after supper. -     aspirin EC 81 MG tablet; Take 1 tablet (81 mg total) by mouth daily. -     amLODipine (NORVASC) 10 MG tablet; Take 1 tablet (10 mg total) by mouth daily.  Hot flashes, menopausal -     venlafaxine XR (EFFEXOR-XR) 37.5 MG 24 hr capsule; Take 1 capsule (37.5 mg total) by mouth daily with breakfast.    No orders of the defined types were placed in this encounter.   Follow-up: No Follow-up on file.   Boykin Nearing MD

## 2016-02-01 NOTE — Assessment & Plan Note (Signed)
HTN uncontrolled Med: compliant Continue regimen with the change of increasing  Norvasc 10 mg daily

## 2016-02-02 LAB — CYTOLOGY - PAP

## 2016-02-03 ENCOUNTER — Telehealth: Payer: Self-pay

## 2016-02-03 NOTE — Telephone Encounter (Signed)
Left voicemail for patient to return call.

## 2016-02-06 LAB — CERVICOVAGINAL ANCILLARY ONLY: Candida vaginitis: NEGATIVE

## 2016-02-06 NOTE — Telephone Encounter (Signed)
Pt. Returned call. Please f/u with pt. °

## 2016-02-06 NOTE — Telephone Encounter (Signed)
-----   Message from Boykin Nearing, MD sent at 02/02/2016  3:51 PM EDT ----- Normal pap, HPV negative

## 2016-02-06 NOTE — Telephone Encounter (Signed)
Please advise patient:  Pap normal, HPV negative and Negative yeast on wet prep.

## 2016-02-07 NOTE — Telephone Encounter (Signed)
Pt. Returned call and was informed of her test being normal.

## 2016-02-08 ENCOUNTER — Telehealth (HOSPITAL_COMMUNITY): Payer: Self-pay | Admitting: *Deleted

## 2016-02-08 NOTE — Telephone Encounter (Signed)
Telephoned patient at home number and confirmed [facility] appointment for Thrusday August 17 1:00

## 2016-02-09 ENCOUNTER — Other Ambulatory Visit (HOSPITAL_COMMUNITY): Payer: Self-pay | Admitting: *Deleted

## 2016-02-09 ENCOUNTER — Ambulatory Visit (HOSPITAL_COMMUNITY)
Admission: RE | Admit: 2016-02-09 | Discharge: 2016-02-09 | Disposition: A | Discharge status: Home / Self Care | Payer: Self-pay | Source: Ambulatory Visit | Attending: Obstetrics and Gynecology | Admitting: Obstetrics and Gynecology

## 2016-02-09 ENCOUNTER — Ambulatory Visit
Admission: RE | Admit: 2016-02-09 | Discharge: 2016-02-09 | Disposition: A | Discharge status: Home / Self Care | Payer: Self-pay | Source: Ambulatory Visit | Attending: Obstetrics and Gynecology | Admitting: Obstetrics and Gynecology

## 2016-02-09 ENCOUNTER — Ambulatory Visit
Admission: RE | Admit: 2016-02-09 | Discharge: 2016-02-09 | Disposition: A | Discharge status: Home / Self Care | Payer: No Typology Code available for payment source | Source: Ambulatory Visit | Attending: Obstetrics and Gynecology | Admitting: Obstetrics and Gynecology

## 2016-02-09 ENCOUNTER — Encounter (HOSPITAL_COMMUNITY): Payer: Self-pay

## 2016-02-09 ENCOUNTER — Telehealth: Payer: Self-pay | Admitting: Family Medicine

## 2016-02-09 VITALS — BP 180/114 | Temp 98.3°F | Ht 63.0 in | Wt 119.6 lb

## 2016-02-09 DIAGNOSIS — N644 Mastodynia: Secondary | ICD-10-CM

## 2016-02-09 DIAGNOSIS — Z1239 Encounter for other screening for malignant neoplasm of breast: Secondary | ICD-10-CM

## 2016-02-09 DIAGNOSIS — N6310 Unspecified lump in the right breast, unspecified quadrant: Secondary | ICD-10-CM

## 2016-02-09 DIAGNOSIS — N6315 Unspecified lump in the right breast, overlapping quadrants: Secondary | ICD-10-CM

## 2016-02-09 HISTORY — DX: Hyperlipidemia, unspecified: E78.5

## 2016-02-09 NOTE — Patient Instructions (Signed)
Explained self breast awareness to [name]. Patient did not need a Pap smear today due to last Pap smear and HPV typing was 02/01/2016. Let her know [facility] will cover Pap smears and HPV typing every 5 years unless has a history of abnormal Pap smears. Referred patient to the [facility] for diagnostic mammogram. Appointment scheduled for Thursday, February 09, 2016 at 1350. Explained to patient the importance of taking her BP medications. Told patient will call [facility] and inform them of her BP. [name] verbalized understanding.  Zonie Crutcher, Arvil Chaco, RN 1:36 PM

## 2016-02-09 NOTE — Progress Notes (Addendum)
Complaints of left breast burning and aching feeling. Patient states the discomfort starts in the lower left breast radiating to the center of breast. Patient states the pain comes and goes. Patient rates pain at a 5 out of 10.  Pap Smear: Pap smear not completed today. Last Pap smear was completed 02/01/2016 at [facility] and normal with negative HPV. Per patient has no history of an abnormal Pap smear. Last Pap smear result is in EPIC.  Physical exam: Breasts Breasts symmetrical. No skin abnormalities bilateral breasts. No nipple retraction bilateral breasts. No nipple discharge bilateral breasts. No lymphadenopathy. No lumps palpated left breast. Palpated a lump within the right breast at 3 o'clock next to areola. Complaints of left outer lower breast tenderness on exam. Referred patient to the [facility] for diagnostic mammogram. Appointment scheduled for Thursday, February 09, 2016 at 1350.    Pelvic/Bimanual No Pap smear completed today since last Pap smear and HPV typing was 02/01/2016. Pap smear not indicated per [facility] guidelines.   Smoking History: Patient has never smoked.  Patient Navigation: Patient education provided. Access to services provided for patient through [facility] program.   Colorectal Cancer Screening: Patient has never had a colonoscopy. No complaints today.  [facility] and reported patients high blood pressure. I spoke with JoHanna. I gave all three blood pressure results, explained the breast pain and concern due to is right where her heart is and how it radiates. Also, let them know a few days ago the patient complained of an episode of left finger numbness. Told JoHanna that patient has not been taking BP medications due to not being able to afford. Asked if there was any options to help patient get her medications today. Gave JoHanna call back number to the [facility] and she stated they would follow  up with the patient.

## 2016-02-09 NOTE — Addendum Note (Signed)
Encounter addended by: Loletta Parish, RN on: 02/09/2016  2:54 PM<BR>    Actions taken: Sign clinical note

## 2016-02-09 NOTE — Progress Notes (Signed)
Patient states has not taken medication for blood pressure in over a week due to not having the funds to get it. Patient states will pick up medication tomorrow. Patient states no blurred vision, no headaches, and no dizziness.

## 2016-02-09 NOTE — Telephone Encounter (Signed)
Christine from [facility] called to speak with pt's nurse regarding pt's medication and blood pressure. Pt's blood pressure was not stable during visit and it may be due to the reason that pt is not taking her meds because she can't afford them. Pt also informed Altha Harm that she has been experiencing numbness on her left fingers. Please follow up with pt [phone]) and Altha Harm [phone]).  Thank you.

## 2016-02-13 ENCOUNTER — Encounter (HOSPITAL_COMMUNITY): Payer: Self-pay | Admitting: *Deleted

## 2016-02-15 ENCOUNTER — Ambulatory Visit: Payer: No Typology Code available for payment source

## 2016-03-16 MED FILL — AMLODIPINE BESYLATE 10 MG T: 10 | 30 days supply | Qty: 30 | Fill #1

## 2016-03-16 MED FILL — ATORVASTATIN 20 MG TABLET: 20 | 30 days supply | Qty: 30 | Fill #1

## 2016-03-16 MED FILL — LISINOPRIL-HCTZ 10-12.5 MG: 10-12.5 | 30 days supply | Qty: 60 | Fill #1

## 2016-03-16 MED FILL — METOPROLOL SUCC ER 25 MG TA: 25 | 30 days supply | Qty: 60 | Fill #1

## 2016-03-16 MED FILL — VENLAFAXINE HCL ER 37.5 MG: 37.5 | 30 days supply | Qty: 30 | Fill #1

## 2016-04-02 ENCOUNTER — Encounter: Payer: Self-pay | Admitting: Internal Medicine

## 2016-04-16 ENCOUNTER — Ambulatory Visit: Payer: No Typology Code available for payment source | Admitting: Internal Medicine

## 2016-04-17 ENCOUNTER — Encounter: Payer: Self-pay | Admitting: Internal Medicine

## 2016-05-12 ENCOUNTER — Emergency Department (HOSPITAL_COMMUNITY)
Admission: EM | Admit: 2016-05-12 | Discharge: 2016-05-12 | Disposition: A | Discharge status: Home / Self Care | Payer: No Typology Code available for payment source | Attending: Emergency Medicine | Admitting: Emergency Medicine

## 2016-05-12 ENCOUNTER — Encounter (HOSPITAL_COMMUNITY): Payer: Self-pay

## 2016-05-12 DIAGNOSIS — I10 Essential (primary) hypertension: Secondary | ICD-10-CM | POA: Insufficient documentation

## 2016-05-12 DIAGNOSIS — M5432 Sciatica, left side: Secondary | ICD-10-CM

## 2016-05-12 DIAGNOSIS — Z8673 Personal history of transient ischemic attack (TIA), and cerebral infarction without residual deficits: Secondary | ICD-10-CM | POA: Insufficient documentation

## 2016-05-12 DIAGNOSIS — Z7982 Long term (current) use of aspirin: Secondary | ICD-10-CM | POA: Insufficient documentation

## 2016-05-12 DIAGNOSIS — M5442 Lumbago with sciatica, left side: Secondary | ICD-10-CM | POA: Insufficient documentation

## 2016-05-12 MED ORDER — METHOCARBAMOL 500 MG PO TABS: 500.0000 mg | ORAL_TABLET | Freq: Two times a day (BID) | ORAL | 0 refills | Status: DC

## 2016-05-12 MED ORDER — NAPROXEN 375 MG PO TABS: 375.0000 mg | ORAL_TABLET | Freq: Two times a day (BID) | ORAL | 0 refills | Status: DC

## 2016-05-12 MED ORDER — HYDROCODONE-ACETAMINOPHEN 5-325 MG PO TABS: 1.0000 | ORAL_TABLET | Freq: Four times a day (QID) | ORAL | 0 refills | Status: DC | PRN

## 2016-05-12 MED ORDER — CYCLOBENZAPRINE HCL 10 MG PO TABS
10.0000 mg | ORAL_TABLET | Freq: Once | ORAL | Status: AC
  Administered 2016-05-12: 10 mg via ORAL
  Filled 2016-05-12: qty 1

## 2016-05-12 NOTE — ED Provider Notes (Signed)
[facility] DEPT Provider Note   CSN: [id number] Arrival date & time: 05/12/16  1452  By signing my name below, I, Camillo Flaming, attest that this documentation has been prepared under the direction and in the presence of Debroah Baller, NP. Electronically Signed: Camillo Flaming, Scribe. 05/12/16. 3:43 PM.   History   Chief Complaint Chief Complaint  Patient presents with  . Back Pain    The history is provided by the patient. No language interpreter was used.  Back Pain   This is a recurrent problem. The current episode started 6 to 12 hours ago. The problem occurs rarely. The problem has been gradually worsening. The pain is associated with lifting heavy objects (lifted mother out of bathtub). The pain is present in the lumbar spine. The quality of the pain is described as burning. The pain does not radiate. The pain is moderate. The symptoms are aggravated by bending and certain positions. The pain is the same all the time. Pertinent negatives include no abdominal pain, no bladder incontinence and no dysuria. She has tried NSAIDs for the symptoms. The treatment provided no relief.    HPI Comments: [name] is a 60 y.o. female with Pmx of HTN  brought in by relative who presents to the Emergency Department complaining of sudden, burning, left lower back pain that started this morning. She reports bending over to assist her mother out of the bathtub when she felt the sudden, burning sensation in her back. She went to work and the pain increased and she took Tylenol for pain with little relief. Patient has a hx of back pain, but has not had an episode in years and does not see a privoder for the pain. Pt states no known drug allergies. She denies urinary frequency, urinary urgency, dysuria or hematuria. She denies use of anti-coagulants. Patient states her blood pressure is high and has not take her medications today. There are no other complaints.     Past Medical History:  Diagnosis Date    . Hyperlipidemia   . Hypertension     Patient Active Problem List   Diagnosis Date Noted  . Breast pain, left 01/05/2016  . Left ankle sprain 01/20/2015  . Gastritis 11/08/2014  . Malaise 10/14/2014  . Essential hypertension   . Bilateral wrist pain 10/06/2014  . Hot flashes, menopausal 10/06/2014  . Cerebral aneurysm without rupture 08/20/2014  . TIA (transient ischemic attack) 08/19/2014  . Accelerated hypertension 02/24/2013  . Depression (emotion) 02/24/2013    Past Surgical History:  Procedure Laterality Date  . CESAREAN SECTION    . CHOLECYSTECTOMY    . NECK SURGERY      OB History    Gravida Para Term Preterm AB Living   2 1 1   1 1    SAB TAB Ectopic Multiple Live Births   1       1       Home Medications    Prior to Admission medications   Medication Sig Start Date End Date Taking? Authorizing Provider  amLODipine (NORVASC) 10 MG tablet Take 1 tablet (10 mg total) by mouth daily. Patient not taking: Reported on 02/09/2016 02/01/16   Boykin Nearing, MD  aspirin EC 81 MG tablet Take 1 tablet (81 mg total) by mouth daily. 02/01/16   Josalyn Funches, MD  atorvastatin (LIPITOR) 20 MG tablet Take 1 tablet (20 mg total) by mouth daily after supper. Patient not taking: Reported on 02/09/2016 02/01/16   Boykin Nearing, MD  HYDROcodone-acetaminophen (NORCO/VICODIN)  5-325 MG tablet Take 1 tablet by mouth every 6 (six) hours as needed. 05/12/16   Jase Reep Bunnie Pion, NP  lisinopril-hydrochlorothiazide (ZESTORETIC) 10-12.5 MG tablet Take 2 tablets by mouth daily. Patient not taking: Reported on 02/09/2016 02/01/16   Boykin Nearing, MD  methocarbamol (ROBAXIN) 500 MG tablet Take 1 tablet (500 mg total) by mouth 2 (two) times daily. 05/12/16   Elianna Windom Bunnie Pion, NP  metoprolol succinate (TOPROL-XL) 25 MG 24 hr tablet Take 2 tablets (50 mg total) by mouth daily. Patient not taking: Reported on 02/09/2016 02/01/16   Boykin Nearing, MD  naproxen (NAPROSYN) 375 MG tablet Take 1 tablet (375 mg  total) by mouth 2 (two) times daily. 05/12/16   Alfons Sulkowski Bunnie Pion, NP  venlafaxine XR (EFFEXOR-XR) 37.5 MG 24 hr capsule Take 1 capsule (37.5 mg total) by mouth daily with breakfast. Patient not taking: Reported on 02/09/2016 02/01/16   Boykin Nearing, MD    Family History Family History  Problem Relation Age of Onset  . Hypertension Mother   . Arthritis Mother   . Diabetes Mother   . Heart disease Father   . Diabetes Sister   . Diabetes Brother   . Diabetes Sister   . Cancer Neg Hx     Social History Social History  Substance Use Topics  . Smoking status: Never Smoker  . Smokeless tobacco: Never Used  . Alcohol use Yes     Comment: ocassionally     Allergies   Patient has no known allergies.   Review of Systems Review of Systems  Gastrointestinal: Negative for abdominal pain, nausea and vomiting.  Genitourinary: Negative for bladder incontinence, dysuria, frequency, hematuria and urgency.  Musculoskeletal: Positive for back pain.  Skin: Negative for wound.  Psychiatric/Behavioral: The patient is not nervous/anxious.      Physical Exam Updated Vital Signs BP 151/98   Pulse 83   Temp 98.5 F (36.9 C)   Resp 18   Ht 5\' 2"  (1.575 m)   Wt 52.6 kg   SpO2 100%   BMI 21.22 kg/m   Physical Exam  Constitutional: She is oriented to person, place, and time. She appears well-developed and well-nourished. No distress.  HENT:  Head: Normocephalic and atraumatic.  Right Ear: Tympanic membrane normal.  Left Ear: Tympanic membrane normal.  Nose: Nose normal.  Mouth/Throat: Uvula is midline, oropharynx is clear and moist and mucous membranes are normal.  Eyes: EOM are normal.  Neck: Normal range of motion. Neck supple.  Cardiovascular: Normal rate and regular rhythm.   Radial pulse 2+ Distal pulse 2+ Adequate circulation  Pulmonary/Chest: Effort normal.  Abdominal: Soft. There is no tenderness.  Musculoskeletal: Normal range of motion.       Lumbar back: She exhibits  tenderness, pain and spasm. She exhibits normal pulse. Decreased range of motion: due to pain.  No tenderness over L spine Tenderness to left lumber spine that radiates over left sciatic nerve Pain with SLR on the left  Neurological: She is alert and oriented to person, place, and time. She has normal strength. No cranial nerve deficit or sensory deficit. Gait normal.  Reflex Scores:      Bicep reflexes are 2+ on the right side and 2+ on the left side.      Brachioradialis reflexes are 2+ on the right side and 2+ on the left side.      Patellar reflexes are 2+ on the right side and 2+ on the left side. Grips are equal  Skin: Skin is  warm and dry.  Psychiatric: She has a normal mood and affect. Her behavior is normal.  Nursing note and vitals reviewed.    ED Treatments / Results  DIAGNOSTIC STUDIES: Oxygen Saturation is 100% on RA, normal by my interpretation.    COORDINATION OF CARE: 3:20 PM Discussed treatment plan with pt at bedside and pt agreed to plan.   Labs (all labs ordered are listed, but only abnormal results are displayed) Labs Reviewed - No data to display   Radiology No results found.  Procedures Procedures (including critical care time)  Medications Ordered in ED Medications  cyclobenzaprine (FLEXERIL) tablet 10 mg (10 mg Oral Given 05/12/16 1543)     Initial Impression / Assessment and Plan / ED Course  I have reviewed the triage vital signs and the nursing notes.  Clinical Course    Patient with back pain.  No neurological deficits and normal neuro exam.  Patient is ambulatory.  No loss of bowel or bladder control.  No concern for cauda equina.  No fever, night sweats, weight loss, h/o cancer, IVDA, no recent procedure to back. No urinary symptoms suggestive of UTI.  Supportive care and return precaution discussed. Appears safe for discharge at this time. Follow up as indicated in discharge paperwork.    Final Clinical Impressions(s) / ED Diagnoses    Final diagnoses:  Sciatica of left side    New Prescriptions Discharge Medication List as of 05/12/2016  3:41 PM    START taking these medications   Details  HYDROcodone-acetaminophen (NORCO/VICODIN) 5-325 MG tablet Take 1 tablet by mouth every 6 (six) hours as needed., Starting Sat 05/12/2016, Print    methocarbamol (ROBAXIN) 500 MG tablet Take 1 tablet (500 mg total) by mouth 2 (two) times daily., Starting Sat 05/12/2016, Print    naproxen (NAPROSYN) 375 MG tablet Take 1 tablet (375 mg total) by mouth 2 (two) times daily., Starting Sat 05/12/2016, Print       I personally performed the services described in this documentation, which was scribed in my presence. The recorded information has been reviewed and is accurate.     Colbert, NP 05/14/16 [id number]    Forde Dandy, MD 05/14/16 1019

## 2016-05-12 NOTE — ED Triage Notes (Signed)
Pt presents with c/o back pain after helping move her mother this morning. Pt reports that the pain is in her lower back, burning in nature. Pt ambulatory to triage.

## 2016-06-28 ENCOUNTER — Encounter (HOSPITAL_COMMUNITY): Payer: Self-pay | Admitting: Neurosurgery

## 2016-08-20 MED FILL — VENLAFAXINE HCL ER 37.5 MG: 37.5 | 30 days supply | Qty: 30 | Fill #2

## 2016-08-20 MED FILL — METOPROLOL SUCC ER 25 MG TA: 25 | 30 days supply | Qty: 60 | Fill #2

## 2016-08-20 MED FILL — LISINOPRIL-HCTZ 10-12.5 MG: 10-12.5 | 30 days supply | Qty: 60 | Fill #2

## 2016-08-20 MED FILL — AMLODIPINE BESYLATE 10 MG T: 10 | 30 days supply | Qty: 30 | Fill #2

## 2016-08-20 MED FILL — ATORVASTATIN 20 MG TABLET: 20 | 30 days supply | Qty: 30 | Fill #2

## 2016-09-01 ENCOUNTER — Emergency Department (HOSPITAL_COMMUNITY)
Admission: EM | Admit: 2016-09-01 | Discharge: 2016-09-01 | Disposition: A | Discharge status: Home / Self Care | Payer: No Typology Code available for payment source | Attending: Emergency Medicine | Admitting: Emergency Medicine

## 2016-09-01 ENCOUNTER — Emergency Department (HOSPITAL_COMMUNITY): Payer: No Typology Code available for payment source

## 2016-09-01 ENCOUNTER — Encounter (HOSPITAL_COMMUNITY): Payer: Self-pay | Admitting: Emergency Medicine

## 2016-09-01 DIAGNOSIS — Z7982 Long term (current) use of aspirin: Secondary | ICD-10-CM | POA: Insufficient documentation

## 2016-09-01 DIAGNOSIS — R002 Palpitations: Secondary | ICD-10-CM | POA: Insufficient documentation

## 2016-09-01 DIAGNOSIS — R072 Precordial pain: Secondary | ICD-10-CM | POA: Insufficient documentation

## 2016-09-01 DIAGNOSIS — Z79899 Other long term (current) drug therapy: Secondary | ICD-10-CM | POA: Insufficient documentation

## 2016-09-01 DIAGNOSIS — Z8673 Personal history of transient ischemic attack (TIA), and cerebral infarction without residual deficits: Secondary | ICD-10-CM | POA: Insufficient documentation

## 2016-09-01 DIAGNOSIS — I10 Essential (primary) hypertension: Secondary | ICD-10-CM | POA: Insufficient documentation

## 2016-09-01 DIAGNOSIS — F430 Acute stress reaction: Secondary | ICD-10-CM | POA: Insufficient documentation

## 2016-09-01 LAB — BASIC METABOLIC PANEL
Anion gap: 7 (ref 5–15)
BUN: 16 mg/dL (ref 6–20)
CO2: 29 mmol/L (ref 22–32)
Calcium: 9.2 mg/dL (ref 8.9–10.3)
Chloride: 104 mmol/L (ref 101–111)
Creatinine, Ser: 0.59 mg/dL (ref 0.44–1.00)
GFR calc Af Amer: >60 mL/min (ref >60)
GFR calc non Af Amer: >60 mL/min (ref >60)
Glucose, Bld: 123 mg/dL — ABNORMAL HIGH (ref 65–99)
Potassium: 3.7 mmol/L (ref 3.5–5.1)
Sodium: 140 mmol/L (ref 135–145)

## 2016-09-01 LAB — I-STAT TROPONIN, ED: Troponin i, poc: 0 ng/mL (ref 0.00–0.08)

## 2016-09-01 LAB — CBC
HCT: 38.3 % (ref 36.0–46.0)
Hemoglobin: 12.8 g/dL (ref 12.0–15.0)
MCH: 27.9 pg (ref 26.0–34.0)
MCHC: 33.4 g/dL (ref 30.0–36.0)
MCV: 83.6 fL (ref 78.0–100.0)
Platelets: 324 10*3/uL (ref 150–400)
RBC: 4.58 MIL/uL (ref 3.87–5.11)
RDW: 13.2 % (ref 11.5–15.5)
WBC: 11.8 10*3/uL — ABNORMAL HIGH (ref 4.0–10.5)

## 2016-09-01 MED ORDER — ACETAMINOPHEN 500 MG PO TABS
1000.0000 mg | ORAL_TABLET | Freq: Once | ORAL | Status: AC
Start: 2016-09-01 — End: 2016-09-01
  Administered 2016-09-01: 1000 mg via ORAL
  Filled 2016-09-01: qty 2

## 2016-09-01 MED ORDER — FAMOTIDINE 20 MG PO TABS
20.0000 mg | ORAL_TABLET | Freq: Once | ORAL | Status: AC
  Administered 2016-09-01: 20 mg via ORAL
  Filled 2016-09-01: qty 1

## 2016-09-01 MED ORDER — ALUM & MAG HYDROXIDE-SIMETH 200-200-20 MG/5ML PO SUSP
15.0000 mL | Freq: Once | ORAL | Status: AC
  Administered 2016-09-01: 15 mL via ORAL
  Filled 2016-09-01: qty 30

## 2016-09-01 NOTE — ED Provider Notes (Signed)
[facility] DEPT Provider Note   CSN: [id number] Arrival date & time: 09/01/16  1014     History   Chief Complaint Chief Complaint  Patient presents with  . Chest Pain    HPI [name] is a 61 y.o. female.  Patient c/o onset palpitations, and midline lower chest discomfort onset yesterday at work at rest. Symptoms constant/persistent.   No associated sob, nv or diaphoresis. Pain is not pleuritic. No syncope/dizziness. Patient states recent increased stressed w parent w dementia, and special needs child.  She feels stress may be related. Denies hx gerd or current heartburn.  Denies cough or uri c/o. No fever or chills. No recent surgery, immobility, travel or trauma. No hx dvt or pe. unspec fam hx heart problems in her father.  Pt denies any recent unusual doe, fatigue, or exertional chest pain or discomfort.        The history is provided by the patient.  Chest Pain   Pertinent negatives include no abdominal pain, no back pain, no fever, no headaches, no shortness of breath and no vomiting.    Past Medical History:  Diagnosis Date  . Hyperlipidemia   . Hypertension     Patient Active Problem List   Diagnosis Date Noted  . Breast pain, left 01/05/2016  . Left ankle sprain 01/20/2015  . Gastritis 11/08/2014  . Malaise 10/14/2014  . Essential hypertension   . Bilateral wrist pain 10/06/2014  . Hot flashes, menopausal 10/06/2014  . Cerebral aneurysm without rupture 08/20/2014  . TIA (transient ischemic attack) 08/19/2014  . Accelerated hypertension 02/24/2013  . Depression (emotion) 02/24/2013    Past Surgical History:  Procedure Laterality Date  . CESAREAN SECTION    . CHOLECYSTECTOMY    . IR GENERIC HISTORICAL  09/21/2014   IR ANGIO INTRA EXTRACRAN SEL INTERNAL CAROTID UNI L MOD SED 09/21/2014 [name], MD [facility]  . IR GENERIC HISTORICAL  09/21/2014   IR ANGIO INTRA EXTRACRAN SEL COM CAROTID INNOMINATE UNI R MOD SED 09/21/2014 [name], MD [facility]  . IR GENERIC HISTORICAL  09/21/2014   IR ANGIO VERTEBRAL SEL VERTEBRAL UNI L MOD SED 09/21/2014 [name], MD [facility]  . IR GENERIC HISTORICAL  09/21/2014   IR 3D INDEPENDENT WKST 09/21/2014 [name], MD [facility]  . NECK SURGERY      OB History    Gravida Para Term Preterm AB Living   2 1 1   1 1    SAB TAB Ectopic Multiple Live Births   1       1       Home Medications    Prior to Admission medications   Medication Sig Start Date End Date Taking? Authorizing Provider  amLODipine (NORVASC) 10 MG tablet Take 1 tablet (10 mg total) by mouth daily. Patient not taking: Reported on 02/09/2016 02/01/16   Boykin Nearing, MD  aspirin EC 81 MG tablet Take 1 tablet (81 mg total) by mouth daily. 02/01/16   Josalyn Funches, MD  atorvastatin (LIPITOR) 20 MG tablet Take 1 tablet (20 mg total) by mouth daily after supper. Patient not taking: Reported on 02/09/2016 02/01/16   Boykin Nearing, MD  HYDROcodone-acetaminophen (NORCO/VICODIN) 5-325 MG tablet Take 1 tablet by mouth every 6 (six) hours as needed. 05/12/16   Hope Bunnie Pion, NP  lisinopril-hydrochlorothiazide (ZESTORETIC) 10-12.5 MG tablet Take 2 tablets by mouth daily. Patient not taking: Reported on 02/09/2016 02/01/16   Boykin Nearing, MD  methocarbamol (ROBAXIN) 500 MG tablet Take  1 tablet (500 mg total) by mouth 2 (two) times daily. 05/12/16   Hope Bunnie Pion, NP  metoprolol succinate (TOPROL-XL) 25 MG 24 hr tablet Take 2 tablets (50 mg total) by mouth daily. Patient not taking: Reported on 02/09/2016 02/01/16   Boykin Nearing, MD  naproxen (NAPROSYN) 375 MG tablet Take 1 tablet (375 mg total) by mouth 2 (two) times daily. 05/12/16   Hope Bunnie Pion, NP  venlafaxine XR (EFFEXOR-XR) 37.5 MG 24 hr capsule Take 1 capsule (37.5 mg total) by mouth daily with breakfast. Patient not taking: Reported on 02/09/2016 02/01/16   Boykin Nearing, MD    Family History Family History  Problem Relation Age of Onset    . Hypertension Mother   . Arthritis Mother   . Diabetes Mother   . Heart disease Father   . Diabetes Sister   . Diabetes Brother   . Diabetes Sister   . Cancer Neg Hx     Social History Social History  Substance Use Topics  . Smoking status: Never Smoker  . Smokeless tobacco: Never Used  . Alcohol use Yes     Comment: ocassionally     Allergies   Patient has no known allergies.   Review of Systems Review of Systems  Constitutional: Negative for chills and fever.  HENT: Negative for sore throat.   Eyes: Negative for redness.  Respiratory: Negative for shortness of breath.   Cardiovascular: Positive for chest pain.  Gastrointestinal: Negative for abdominal pain and vomiting.  Genitourinary: Negative for flank pain.  Musculoskeletal: Negative for back pain and neck pain.  Skin: Negative for rash.  Neurological: Negative for headaches.  Hematological: Does not bruise/bleed easily.  Psychiatric/Behavioral: Negative for confusion.       +recent stress     Physical Exam Updated Vital Signs BP 154/95 (BP Location: Right Arm)   Pulse 98   Temp 98.3 F (36.8 C) (Oral)   Resp 18   SpO2 100%   Physical Exam   ED Treatments / Results  Labs (all labs ordered are listed, but only abnormal results are displayed) Results for orders placed or performed during the hospital encounter of 93/23/55  Basic metabolic panel  Result Value Ref Range   Sodium 140 135 - 145 mmol/L   Potassium 3.7 3.5 - 5.1 mmol/L   Chloride 104 101 - 111 mmol/L   CO2 29 22 - 32 mmol/L   Glucose, Bld 123 (H) 65 - 99 mg/dL   BUN 16 6 - 20 mg/dL   Creatinine, Ser 0.59 0.44 - 1.00 mg/dL   Calcium 9.2 8.9 - 10.3 mg/dL   GFR calc non Af Amer >60 >60 mL/min   GFR calc Af Amer >60 >60 mL/min   Anion gap 7 5 - 15  CBC  Result Value Ref Range   WBC 11.8 (H) 4.0 - 10.5 K/uL   RBC 4.58 3.87 - 5.11 MIL/uL   Hemoglobin 12.8 12.0 - 15.0 g/dL   HCT 38.3 36.0 - 46.0 %   MCV 83.6 78.0 - 100.0 fL   MCH  27.9 26.0 - 34.0 pg   MCHC 33.4 30.0 - 36.0 g/dL   RDW 13.2 11.5 - 15.5 %   Platelets 324 150 - 400 K/uL  I-stat troponin, ED  Result Value Ref Range   Troponin i, poc 0.00 0.00 - 0.08 ng/mL   Comment 3           Dg Chest 2 View  Result Date: 09/01/2016 CLINICAL DATA:  Left-sided  chest pain since yesterday. EXAM: CHEST  2 VIEW COMPARISON:  12/14/2015 FINDINGS: The heart size and mediastinal contours are within normal limits. Both lungs are clear. The visualized skeletal structures are unremarkable. IMPRESSION: No active cardiopulmonary disease. Electronically Signed   By: Kathreen Devoid   On: 09/01/2016 10:51    EKG  EKG Interpretation  Date/Time:  Saturday September 01 2016 10:26:37 EST Ventricular Rate:  86 PR Interval:    QRS Duration: 85 QT Interval:  382 QTC Calculation: 457 R Axis:   -15 Text Interpretation:  Sinus rhythm Atrial premature complex Probable left ventricular hypertrophy Nonspecific T wave abnormality No significant change since last tracing Confirmed by Ashok Cordia  MD, Lennette Bihari ([id number]) on 09/01/2016 12:34:17 PM       Radiology Dg Chest 2 View  Result Date: 09/01/2016 CLINICAL DATA:  Left-sided chest pain since yesterday. EXAM: CHEST  2 VIEW COMPARISON:  12/14/2015 FINDINGS: The heart size and mediastinal contours are within normal limits. Both lungs are clear. The visualized skeletal structures are unremarkable. IMPRESSION: No active cardiopulmonary disease. Electronically Signed   By: Kathreen Devoid   On: 09/01/2016 10:51    Procedures Procedures (including critical care time)  Medications Ordered in ED Medications  famotidine (PEPCID) tablet 20 mg (not administered)  acetaminophen (TYLENOL) tablet 1,000 mg (not administered)  alum & mag hydroxide-simeth (MAALOX/MYLANTA) 200-200-20 MG/5ML suspension 15 mL (not administered)     Initial Impression / Assessment and Plan / ED Course  I have reviewed the triage vital signs and the nursing notes.  Pertinent labs &  imaging results that were available during my care of the patient were reviewed by me and considered in my medical decision making (see chart for details).  Ecg.  Cxr. Labs.  Pt drove self to ED.  Tylenol po, pepcid and maalox for symptom relief.  After symptoms for past day, trop negative.  Symptoms/eval do not appear to be c/w acs.  ?contribution of stress/anxiety, and possibly gi symptoms.   Given ?fam hx heart disease, will have f/u cardiology re cp/palpitations.   Pt currently appears stable for d/c.     Final Clinical Impressions(s) / ED Diagnoses   Final diagnoses:  None    New Prescriptions New Prescriptions   No medications on file     Lajean Saver, MD 09/01/16 1234

## 2016-09-01 NOTE — ED Triage Notes (Signed)
Pt c/o achy left chest discomfort, tachycardia, palpitations, some SOB onset yesterday. Woke up this morning with left chest burning and discomfort. 1 episode eft arm tingling today. Reports increased stress recently. Family hx of heart disease. No SOB.

## 2016-09-01 NOTE — ED Notes (Signed)
Bed: [facility] Expected date:  Expected time:  Means of arrival:  Comments: 

## 2016-09-01 NOTE — ED Notes (Signed)
Pt ambulatory and independent at discharge.  Verbalized understanding of discharge instructions 

## 2016-09-01 NOTE — Discharge Instructions (Signed)
It was our pleasure to provide your ER care today - we hope that you feel better.  For chest pain and palpitations, follow up with cardiologist in the next 1-2 weeks - see referral - call office Monday to arrange appointment.  If gi symptoms, you may try pepcid and maalox for symptom relief.  For stress, get adequate rest.  Try relaxation exercises such as meditation, yoga, and/or exercise.  Follow up with primary care doctor in the coming week.  Return to ER if worse, new symptoms, trouble breathing, persistent fast heart beat, recurrent or persistent chest pain, other concern.

## 2016-11-07 ENCOUNTER — Encounter: Payer: Self-pay | Admitting: Family Medicine

## 2017-01-17 ENCOUNTER — Ambulatory Visit: Payer: No Typology Code available for payment source

## 2017-04-01 ENCOUNTER — Emergency Department (HOSPITAL_COMMUNITY)
Admission: EM | Admit: 2017-04-01 | Discharge: 2017-04-01 | Disposition: A | Discharge status: Home / Self Care | Payer: No Typology Code available for payment source | Attending: Emergency Medicine | Admitting: Emergency Medicine

## 2017-04-01 ENCOUNTER — Encounter (HOSPITAL_COMMUNITY): Payer: Self-pay | Admitting: Emergency Medicine

## 2017-04-01 DIAGNOSIS — Z5321 Procedure and treatment not carried out due to patient leaving prior to being seen by health care provider: Secondary | ICD-10-CM | POA: Insufficient documentation

## 2017-04-01 DIAGNOSIS — Z760 Encounter for issue of repeat prescription: Secondary | ICD-10-CM | POA: Insufficient documentation

## 2017-04-01 NOTE — ED Triage Notes (Signed)
Patient reports that she dropped something on right big toe about month ago and still having pain. Patient reports that she has knot on back of foot that is also causing pain.  Patient also out of HTN medications and needing refill until she can get back in [facility].

## 2017-04-09 ENCOUNTER — Ambulatory Visit (HOSPITAL_COMMUNITY)
Admission: EM | Admit: 2017-04-09 | Discharge: 2017-04-09 | Disposition: A | Discharge status: Home / Self Care | Payer: Self-pay | Attending: Family Medicine | Admitting: Family Medicine

## 2017-04-09 ENCOUNTER — Telehealth: Payer: Self-pay | Admitting: Family Medicine

## 2017-04-09 ENCOUNTER — Encounter (HOSPITAL_COMMUNITY): Payer: Self-pay | Admitting: Emergency Medicine

## 2017-04-09 ENCOUNTER — Ambulatory Visit (INDEPENDENT_AMBULATORY_CARE_PROVIDER_SITE_OTHER): Payer: Self-pay

## 2017-04-09 DIAGNOSIS — M79671 Pain in right foot: Secondary | ICD-10-CM

## 2017-04-09 DIAGNOSIS — I10 Essential (primary) hypertension: Secondary | ICD-10-CM

## 2017-04-09 DIAGNOSIS — M25571 Pain in right ankle and joints of right foot: Secondary | ICD-10-CM

## 2017-04-09 MED ORDER — AMLODIPINE BESYLATE 10 MG PO TABS: 10.0000 mg | ORAL_TABLET | Freq: Every day | ORAL | 0 refills | Status: DC

## 2017-04-09 MED ORDER — LISINOPRIL-HYDROCHLOROTHIAZIDE 10-12.5 MG PO TABS: 2.0000 | ORAL_TABLET | Freq: Every day | ORAL | 0 refills | Status: DC

## 2017-04-09 MED ORDER — METOPROLOL SUCCINATE ER 25 MG PO TB24: 50.0000 mg | ORAL_TABLET | Freq: Every day | ORAL | 0 refills | Status: DC

## 2017-04-09 MED FILL — LISINOPRIL-HCTZ 10-12.5 MG: 10-12.5 | 30 days supply | Qty: 60 | Fill #0

## 2017-04-09 MED FILL — AMLODIPINE BESYLATE 10 MG T: 10 | 30 days supply | Qty: 30 | Fill #0

## 2017-04-09 NOTE — Telephone Encounter (Signed)
PT called and requested that all [facility]s prescribed [facility] because she cannot afford them from original pharmacy.

## 2017-04-09 NOTE — ED Triage Notes (Signed)
PT reports she dropped something on her right foot a month ago. PT has continued pain in right toe and right heel. PT reports facial pain as well. PT reports she has been out of her meds for a few months and thinks her BP is up

## 2017-04-09 NOTE — Discharge Instructions (Addendum)
For your foot pain, please take ibuprofen 600mg  every 6 hours for the next week then taken as needed. You can keep taking tylenol for pain as well.   For your high blood pressure, I have given you a one time refill of your medications. Please call and get back in to see your primary care doctor. We talked about signs of stroke to watch out for with very high blood pressures, if these should occur please go to the ER.

## 2017-04-09 NOTE — ED Provider Notes (Signed)
[facility]    CSN: [id number] Arrival date & time: 04/09/17  1008     History   Chief Complaint Chief Complaint  Patient presents with  . Foot Pain  . Hypertension  . Medication Refill    HPI [name] is a 61 y.o. female.   Patient presents with R foot pain. States she dropped a stepstool on her R big toe 4 weeks ago was able to move it at the time but pain has persisted. States her pain is acutally more at the tip of her toe as if she stubbed it and not across the top of her toe where the injury happened though she has noticed that the top of her toe is shiny and a little bigger compared to her other foot. She also states over the last few weeks has developed R heel pain that is sharp and occurs occasionally particularly with movement and resolves with rest. She has not had any injury to her heel and has not had any increased physical activity. Has been able to stand on her R foot and ambulate without falls.  Patient states that she has high blood pressure and is on 4 different medications of which she has not taken for the past 2 months because has not been back to see her PCP. No HA or numbness/weakness. No CP or SOB.       Past Medical History:  Diagnosis Date  . Hyperlipidemia   . Hypertension     Patient Active Problem List   Diagnosis Date Noted  . Breast pain, left 01/05/2016  . Left ankle sprain 01/20/2015  . Gastritis 11/08/2014  . Malaise 10/14/2014  . Essential hypertension   . Bilateral wrist pain 10/06/2014  . Hot flashes, menopausal 10/06/2014  . Cerebral aneurysm without rupture 08/20/2014  . TIA (transient ischemic attack) 08/19/2014  . Accelerated hypertension 02/24/2013  . Depression (emotion) 02/24/2013    Past Surgical History:  Procedure Laterality Date  . CESAREAN SECTION    . CHOLECYSTECTOMY    . IR GENERIC HISTORICAL  09/21/2014   IR ANGIO INTRA EXTRACRAN SEL INTERNAL CAROTID UNI L MOD SED 09/21/2014 [name], MD [facility]  . IR GENERIC HISTORICAL  09/21/2014   IR ANGIO INTRA EXTRACRAN SEL COM CAROTID INNOMINATE UNI R MOD SED 09/21/2014 [name], MD [facility]  . IR GENERIC HISTORICAL  09/21/2014   IR ANGIO VERTEBRAL SEL VERTEBRAL UNI L MOD SED 09/21/2014 [name], MD [facility]  . IR GENERIC HISTORICAL  09/21/2014   IR 3D INDEPENDENT WKST 09/21/2014 [name], MD [facility]  . NECK SURGERY      OB History    Gravida Para Term Preterm AB Living   2 1 1   1 1    SAB TAB Ectopic Multiple Live Births   1       1       Home Medications    Prior to Admission medications   Medication Sig Start Date End Date Taking? Authorizing Provider  aspirin EC 81 MG tablet Take 1 tablet (81 mg total) by mouth daily. 02/01/16  Yes Funches, Josalyn, MD  amLODipine (NORVASC) 10 MG tablet Take 1 tablet (10 mg total) by mouth daily. 02/01/16   Funches, Adriana Mccallum, MD  atorvastatin (LIPITOR) 20 MG tablet Take 1 tablet (20 mg total) by mouth daily after supper. 02/01/16   Funches, Adriana Mccallum, MD  lisinopril-hydrochlorothiazide (ZESTORETIC) 10-12.5 MG tablet Take 2 tablets by mouth daily. Patient taking differently: Take  1 tablet by mouth 2 (two) times daily.  02/01/16   Funches, Adriana Mccallum, MD  metoprolol succinate (TOPROL-XL) 25 MG 24 hr tablet Take 2 tablets (50 mg total) by mouth daily. Patient taking differently: Take 25 mg by mouth 2 (two) times daily.  02/01/16   Funches, Adriana Mccallum, MD  naproxen (NAPROSYN) 375 MG tablet Take 1 tablet (375 mg total) by mouth 2 (two) times daily. Patient not taking: Reported on 09/01/2016 05/12/16   Ashley Murrain, NP  venlafaxine XR (EFFEXOR-XR) 37.5 MG 24 hr capsule Take 1 capsule (37.5 mg total) by mouth daily with breakfast. 02/01/16   Boykin Nearing, MD    Family History Family History  Problem Relation Age of Onset  . Hypertension Mother   . Arthritis Mother   . Diabetes Mother   . Heart disease Father   . Diabetes Sister   . Diabetes Brother    . Diabetes Sister   . Cancer Neg Hx     Social History Social History  Substance Use Topics  . Smoking status: Never Smoker  . Smokeless tobacco: Never Used  . Alcohol use Yes     Comment: ocassionally     Allergies   Patient has no known allergies.   Review of Systems Review of Systems  Constitutional: Negative for chills and fever.  Eyes: Negative for visual disturbance.  Respiratory: Negative for shortness of breath.   Cardiovascular: Negative for chest pain, palpitations and leg swelling.  Gastrointestinal: Negative for abdominal pain, diarrhea, nausea and vomiting.  Musculoskeletal: Negative for gait problem.       R big toe and R heel pain with swelling   Skin: Negative for rash.  Neurological: Negative for facial asymmetry, speech difficulty, weakness, numbness and headaches.     Physical Exam Triage Vital Signs ED Triage Vitals  Enc Vitals Group     BP 04/09/17 1041 (!) 203/111     Pulse Rate 04/09/17 1041 74     Resp 04/09/17 1041 16     Temp 04/09/17 1041 98.8 F (37.1 C)     Temp Source 04/09/17 1041 Oral     SpO2 04/09/17 1041 99 %     Weight 04/09/17 1040 116 lb (52.6 kg)     Height 04/09/17 1040 5\' 2"  (1.575 m)     Head Circumference --      Peak Flow --      Pain Score 04/09/17 1040 8     Pain Loc --      Pain Edu? --      Excl. in [place]? --    No data found.   Updated Vital Signs BP (!) 203/111   Pulse 74   Temp 98.8 F (37.1 C) (Oral)   Resp 16   Ht 5\' 2"  (1.575 m)   Wt 116 lb (52.6 kg)   SpO2 99%   BMI 21.22 kg/m   Visual Acuity Right Eye Distance:   Left Eye Distance:   Bilateral Distance:    Right Eye Near:   Left Eye Near:    Bilateral Near:     Physical Exam  Constitutional: She is oriented to person, place, and time. She appears well-developed and well-nourished. No distress.  HENT:  Head: Normocephalic and atraumatic.  Eyes: Pupils are equal, round, and reactive to light. Conjunctivae and EOM are normal.  Neck:  Normal range of motion. Neck supple.  Cardiovascular: Normal rate, regular rhythm, normal heart sounds and intact distal pulses.   No murmur heard. Pulmonary/Chest: Effort  normal and breath sounds normal. No respiratory distress. She has no wheezes.  Abdominal: Soft. Bowel sounds are normal. She exhibits no distension. There is no tenderness. There is no rebound and no guarding.  Musculoskeletal: Normal range of motion. She exhibits tenderness (TTP 3cm above posterior calcaneus with some mild edema but no erythema. No TTP over R great toe but both feet with hallux valgus deformity. no ingrown toenails.).  Neurological: She is alert and oriented to person, place, and time. No cranial nerve deficit or sensory deficit. She exhibits normal muscle tone. Coordination normal.  Skin: Skin is warm and dry. Capillary refill takes less than 2 seconds.  Psychiatric: She has a normal mood and affect.     UC Treatments / Results  Labs (all labs ordered are listed, but only abnormal results are displayed) Labs Reviewed - No data to display  EKG  EKG Interpretation None       Radiology Dg Foot Complete Right  Result Date: 04/09/2017 CLINICAL DATA:  Right foot injury 1 month ago with persistent pain, initial encounter EXAM: RIGHT FOOT COMPLETE - 3+ VIEW COMPARISON:  None. FINDINGS: Hallux valgus deformity is noted. No acute fracture or dislocation is noted. No soft tissue abnormality is seen. IMPRESSION: Mild degenerative change without acute abnormality. Electronically Signed   By: Inez Catalina M.D.   On: 04/09/2017 11:22    Procedures Procedures (including critical care time)  Medications Ordered in UC Medications - No data to display   Initial Impression / Assessment and Plan / UC Course  I have reviewed the triage vital signs and the nursing notes.  Pertinent labs & imaging results that were available during my care of the patient were reviewed by me and considered in my medical decision  making (see chart for details).   Patient presents to urgent care with R foot pain but was noted to have elevated BP to 200s/100s in the setting of having run out of her antihypertensive medications. She is asymptomatic from the elevated BP and neuro exam was unremarkable so she was given a one time refill of her antihypertensive medications and instructed to follow up with her PCP.  For the patient's foot pain, a xray of the R foot did not show any fractures. I suspect that her R toe pain is from a contusion that is in the process of healing and will continue to improve with rest and NSAID use. The patient's R heel pain may be from achilles tendinopathy given tenderness to palpation at the site of achilles tendon insertion though no history of overuse. Recommended ice and rest with NSAIDs for this as well. Patient stated that she would prefer to follow up podiatry, I recommended that she see her PCP to obtain a referral for this. She voiced good understanding.   Final Clinical Impressions(s) / UC Diagnoses   Final diagnoses:  Hypertension, unspecified type  Toe joint pain, right  Pain of right heel    New Prescriptions Discharge Medication List as of 04/09/2017 11:29 AM        Bufford Lope, DO 04/09/17 1208

## 2017-04-25 ENCOUNTER — Ambulatory Visit (INDEPENDENT_AMBULATORY_CARE_PROVIDER_SITE_OTHER): Payer: Self-pay | Admitting: Physician Assistant

## 2017-04-25 ENCOUNTER — Encounter (INDEPENDENT_AMBULATORY_CARE_PROVIDER_SITE_OTHER): Payer: Self-pay | Admitting: Physician Assistant

## 2017-04-25 VITALS — BP 147/98 | HR 88 | Temp 98.0°F | Wt 108.8 lb

## 2017-04-25 DIAGNOSIS — M21942 Unspecified acquired deformity of hand, left hand: Secondary | ICD-10-CM

## 2017-04-25 DIAGNOSIS — Z1211 Encounter for screening for malignant neoplasm of colon: Secondary | ICD-10-CM

## 2017-04-25 DIAGNOSIS — I10 Essential (primary) hypertension: Secondary | ICD-10-CM

## 2017-04-25 DIAGNOSIS — M21941 Unspecified acquired deformity of hand, right hand: Secondary | ICD-10-CM

## 2017-04-25 DIAGNOSIS — Z23 Encounter for immunization: Secondary | ICD-10-CM

## 2017-04-25 DIAGNOSIS — M9261 Juvenile osteochondrosis of tarsus, right ankle: Secondary | ICD-10-CM

## 2017-04-25 MED ORDER — METOPROLOL SUCCINATE ER 25 MG PO TB24: 50.0000 mg | ORAL_TABLET | Freq: Every day | ORAL | 0 refills | Status: DC

## 2017-04-25 MED ORDER — AMLODIPINE BESYLATE 10 MG PO TABS: 10.0000 mg | ORAL_TABLET | Freq: Every day | ORAL | 0 refills | Status: DC

## 2017-04-25 MED ORDER — LISINOPRIL-HYDROCHLOROTHIAZIDE 10-12.5 MG PO TABS: 2.0000 | ORAL_TABLET | Freq: Every day | ORAL | 0 refills | Status: DC

## 2017-04-25 MED ORDER — NAPROXEN 500 MG PO TABS
500.0000 mg | ORAL_TABLET | Freq: Two times a day (BID) | ORAL | 0 refills | Status: DC
Start: 2017-04-25 — End: 2017-06-22

## 2017-04-25 MED FILL — METOPROLOL SUCC ER 25 MG TA: 25 | 30 days supply | Qty: 60 | Fill #0

## 2017-04-25 MED FILL — NAPROXEN 500 MG TABLET: 500 | 15 days supply | Qty: 30 | Fill #0

## 2017-04-25 MED FILL — LISINOPRIL-HCTZ 10-12.5 MG: 10-12.5 | 30 days supply | Qty: 60 | Fill #0

## 2017-04-25 MED FILL — AMLODIPINE BESYLATE 10 MG T: 10 | 30 days supply | Qty: 30 | Fill #0

## 2017-04-25 NOTE — Progress Notes (Signed)
Subjective:  Patient ID: [name], female    DOB: 1956-03-29  Age: 61 y.o. MRN: [id number]  CC: right foot pain and HTN  HPI [name] is a 61 y.o. female with a medical history of HLD and HTN presents as a new patient with complaint of right foot pain after dropping a stepstool on right hallux approximately six weeks ago. Went to Urgent care on 04/09/17 with right foot pain and xrays showed no fracture. Right hallux is better now. Was also suspected of having achilles tendinopathy while in urgent care. Pt describes a "bump" that forms at the posterior aspect of her heel. The "bump" appears and disappears depending on the level of her activities, however, there is always some degree of discomfort.     History of hypertension which was addressed at urgent care also. BP at urgent care 203/111 HR 74. Patient had not been on antihypertensives for two months at that point. Urgent care prescribed Amlodipine, Zestoretic, and Metoprolol succinate. BP in clinic today 147/98. Reports feeling well. Does not endorse CP, palpitations, SOB, HA, PND, orthopnea, presyncope, syncope, abdominal pain, f/c/n/v, rash,     Has a deformity of the 1st, 2nd, and 3rd digits of both hands with gradual progression of bony deformity since last year. Thinks she may have arthritis. Worse with activities requiring manual dexterity and repetitive motions of the hands.     Outpatient Medications Prior to Visit  Medication Sig Dispense Refill  . amLODipine (NORVASC) 10 MG tablet Take 1 tablet (10 mg total) by mouth daily. 30 tablet 0  . aspirin EC 81 MG tablet Take 1 tablet (81 mg total) by mouth daily. 90 tablet 3  . atorvastatin (LIPITOR) 20 MG tablet Take 1 tablet (20 mg total) by mouth daily after supper. 90 tablet 3  . lisinopril-hydrochlorothiazide (ZESTORETIC) 10-12.5 MG tablet Take 2 tablets by mouth daily. 60 tablet 0  . metoprolol succinate (TOPROL-XL) 25 MG 24 hr tablet Take 2 tablets (50 mg total) by  mouth daily. 60 tablet 0  . naproxen (NAPROSYN) 375 MG tablet Take 1 tablet (375 mg total) by mouth 2 (two) times daily. (Patient not taking: Reported on 09/01/2016) 20 tablet 0  . venlafaxine XR (EFFEXOR-XR) 37.5 MG 24 hr capsule Take 1 capsule (37.5 mg total) by mouth daily with breakfast. 30 capsule 5   No facility-administered medications prior to visit.      ROS Review of Systems  Constitutional: Negative for chills, fever and malaise/fatigue.  Eyes: Negative for blurred vision.  Respiratory: Negative for shortness of breath.   Cardiovascular: Negative for chest pain and palpitations.  Gastrointestinal: Negative for abdominal pain and nausea.  Genitourinary: Negative for dysuria and hematuria.  Musculoskeletal: Negative for joint pain and myalgias.       Right heel pain. Deformity of the hands.  Skin: Negative for rash.  Neurological: Negative for tingling and headaches.  Psychiatric/Behavioral: Negative for depression. The patient is not nervous/anxious.     Objective:  Blood pressure (!) 147/98, pulse 88, temperature 98 F (36.7 C), temperature source Oral, weight 108 lb 12.8 oz (49.4 kg), SpO2 99 %.  Physical Exam  Constitutional: She is oriented to person, place, and time.  Well developed, well nourished, NAD, polite  HENT:  Head: Normocephalic and atraumatic.  Eyes: No scleral icterus.  Neck: Normal range of motion. Neck supple. No thyromegaly present.  Cardiovascular: Normal rate, regular rhythm and normal heart sounds.   Pulmonary/Chest: Effort normal and breath sounds normal. No  respiratory distress. She has no wheezes.  Musculoskeletal: She exhibits no edema.  Bony hypertrophy of the 1st, 2nd, and 3rd MCPs and PIPs of the hand bilaterally.  Right heel with no TTP, edema, erythema, ecchymosis, or deformity.  Neurological: She is alert and oriented to person, place, and time. No cranial nerve deficit. Coordination normal.  Skin: Skin is warm and dry. No rash noted.  No erythema. No pallor.  Psychiatric: She has a normal mood and affect. Her behavior is normal. Thought content normal.  Vitals reviewed.    Assessment & Plan:   1. Hypertension, unspecified type - Patient at nearly two weeks with anti-hypertensives. Will reasses efficacy of anti-hypertensives in two weeks and will allow patient to adjust diet over the next two weeks to include less salt. She was instructed not to consume more that 2000 milligrams of sodium per day. - CBC with Differential - Comprehensive metabolic panel - TSH - Lipid panel - Refill lisinopril-hydrochlorothiazide (ZESTORETIC) 10-12.5 MG tablet; Take 2 tablets by mouth daily.  Dispense: 60 tablet; Refill: 0 - Refill metoprolol succinate (TOPROL-XL) 25 MG 24 hr tablet; Take 2 tablets (50 mg total) by mouth daily.  Dispense: 60 tablet; Refill: 0 - Refill amLODipine (NORVASC) 10 MG tablet; Take 1 tablet (10 mg total) by mouth daily.  Dispense: 30 tablet; Refill: 0  2. Haglund's deformity of right heel - I have advised patient the following: Please wear open back shoes. Take anti-inflammatory medicine (Naproxen) that has been prescribed to you. Apply ice for 20 to 40 minutes if swelling occurs. Avoid prolonged walking or standing. - Begin naproxen (NAPROSYN) 500 MG tablet; Take 1 tablet (500 mg total) by mouth 2 (two) times daily with a meal.  Dispense: 30 tablet; Refill: 0  3. Need for Tdap vaccination - Tdap vaccine greater than or equal to 7yo IM  4. Need for prophylactic vaccination and inoculation against influenza - Flu Vaccine QUAD 6+ mos PF IM (Fluarix Quad PF)  5. Special screening for malignant neoplasms, colon - Fecal occult blood, imunochemical  6. Acquired deformity of both hands - Cyclic citrul peptide antibody, IgG - ANA w/Reflex - Begin naproxen (NAPROSYN) 500 MG tablet; Take 1 tablet (500 mg total) by mouth 2 (two) times daily with a meal.  Dispense: 30 tablet; Refill: 0     Meds ordered this  encounter  Medications  . naproxen (NAPROSYN) 500 MG tablet    Sig: Take 1 tablet (500 mg total) by mouth 2 (two) times daily with a meal.    Dispense:  30 tablet    Refill:  0    Order Specific Question:   Supervising Provider    Answer:   Tresa Garter [id number]  . lisinopril-hydrochlorothiazide (ZESTORETIC) 10-12.5 MG tablet    Sig: Take 2 tablets by mouth daily.    Dispense:  60 tablet    Refill:  0    Order Specific Question:   Supervising Provider    Answer:   Tresa Garter [id number]  . metoprolol succinate (TOPROL-XL) 25 MG 24 hr tablet    Sig: Take 2 tablets (50 mg total) by mouth daily.    Dispense:  60 tablet    Refill:  0    Order Specific Question:   Supervising Provider    Answer:   Tresa Garter [id number]  . amLODipine (NORVASC) 10 MG tablet    Sig: Take 1 tablet (10 mg total) by mouth daily.    Dispense:  30 tablet  Refill:  0    Order Specific Question:   Supervising Provider    Answer:   Tresa Garter [[id number]]    Follow-up: Return in about 4 weeks (around 05/23/2017) for f/u htn.   Clent Demark PA

## 2017-04-25 NOTE — Patient Instructions (Addendum)
Please wear open back shoes. Take anti-inflammatory medicine (Naproxen) that has been prescribed to you. Apply ice for 20 to 40 minutes if swelling occurs. Avoid prolonged walking or standing.  Hypertension Hypertension, commonly called high blood pressure, is when the force of blood pumping through the arteries is too strong. The arteries are the blood vessels that carry blood from the heart throughout the body. Hypertension forces the heart to work harder to pump blood and may cause arteries to become narrow or stiff. Having untreated or uncontrolled hypertension can cause heart attacks, strokes, kidney disease, and other problems. A blood pressure reading consists of a higher number over a lower number. Ideally, your blood pressure should be below 120/80. The first ("top") number is called the systolic pressure. It is a measure of the pressure in your arteries as your heart beats. The second ("bottom") number is called the diastolic pressure. It is a measure of the pressure in your arteries as the heart relaxes. What are the causes? The cause of this condition is not known. What increases the risk? Some risk factors for high blood pressure are under your control. Others are not. Factors you can change  Smoking.  Having type 2 diabetes mellitus, high cholesterol, or both.  Not getting enough exercise or physical activity.  Being overweight.  Having too much fat, sugar, calories, or salt (sodium) in your diet.  Drinking too much alcohol. Factors that are difficult or impossible to change  Having chronic kidney disease.  Having a family history of high blood pressure.  Age. Risk increases with age.  Race. You may be at higher risk if you are African-American.  Gender. Men are at higher risk than women before age 73. After age 50, women are at higher risk than men.  Having obstructive sleep apnea.  Stress. What are the signs or symptoms? Extremely high blood pressure  (hypertensive crisis) may cause:  Headache.  Anxiety.  Shortness of breath.  Nosebleed.  Nausea and vomiting.  Severe chest pain.  Jerky movements you cannot control (seizures).  How is this diagnosed? This condition is diagnosed by measuring your blood pressure while you are seated, with your arm resting on a surface. The cuff of the blood pressure monitor will be placed directly against the skin of your upper arm at the level of your heart. It should be measured at least twice using the same arm. Certain conditions can cause a difference in blood pressure between your right and left arms. Certain factors can cause blood pressure readings to be lower or higher than normal (elevated) for a short period of time:  When your blood pressure is higher when you are in a health care provider's office than when you are at home, this is called white coat hypertension. Most people with this condition do not need medicines.  When your blood pressure is higher at home than when you are in a health care provider's office, this is called masked hypertension. Most people with this condition may need medicines to control blood pressure.  If you have a high blood pressure reading during one visit or you have normal blood pressure with other risk factors:  You may be asked to return on a different day to have your blood pressure checked again.  You may be asked to monitor your blood pressure at home for 1 week or longer.  If you are diagnosed with hypertension, you may have other blood or imaging tests to help your health care provider understand your  overall risk for other conditions. How is this treated? This condition is treated by making healthy lifestyle changes, such as eating healthy foods, exercising more, and reducing your alcohol intake. Your health care provider may prescribe medicine if lifestyle changes are not enough to get your blood pressure under control, and if:  Your systolic blood  pressure is above 130.  Your diastolic blood pressure is above 80.  Your personal target blood pressure may vary depending on your medical conditions, your age, and other factors. Follow these instructions at home: Eating and drinking  Eat a diet that is high in fiber and potassium, and low in sodium, added sugar, and fat. An example eating plan is called the DASH (Dietary Approaches to Stop Hypertension) diet. To eat this way: ? Eat plenty of fresh fruits and vegetables. Try to fill half of your plate at each meal with fruits and vegetables. ? Eat whole grains, such as whole wheat pasta, brown rice, or whole grain bread. Fill about one quarter of your plate with whole grains. ? Eat or drink low-fat dairy products, such as skim milk or low-fat yogurt. ? Avoid fatty cuts of meat, processed or cured meats, and poultry with skin. Fill about one quarter of your plate with lean proteins, such as fish, chicken without skin, beans, eggs, and tofu. ? Avoid premade and processed foods. These tend to be higher in sodium, added sugar, and fat.  Reduce your daily sodium intake. Most people with hypertension should eat less than 1,500 mg of sodium a day.  Limit alcohol intake to no more than 1 drink a day for nonpregnant women and 2 drinks a day for men. One drink equals 12 oz of beer, 5 oz of wine, or 1 oz of hard liquor. Lifestyle  Work with your health care provider to maintain a healthy body weight or to lose weight. Ask what an ideal weight is for you.  Get at least 30 minutes of exercise that causes your heart to beat faster (aerobic exercise) most days of the week. Activities may include walking, swimming, or biking.  Include exercise to strengthen your muscles (resistance exercise), such as pilates or lifting weights, as part of your weekly exercise routine. Try to do these types of exercises for 30 minutes at least 3 days a week.  Do not use any products that contain nicotine or tobacco, such  as cigarettes and e-cigarettes. If you need help quitting, ask your health care provider.  Monitor your blood pressure at home as told by your health care provider.  Keep all follow-up visits as told by your health care provider. This is important. Medicines  Take over-the-counter and prescription medicines only as told by your health care provider. Follow directions carefully. Blood pressure medicines must be taken as prescribed.  Do not skip doses of blood pressure medicine. Doing this puts you at risk for problems and can make the medicine less effective.  Ask your health care provider about side effects or reactions to medicines that you should watch for. Contact a health care provider if:  You think you are having a reaction to a medicine you are taking.  You have headaches that keep coming back (recurring).  You feel dizzy.  You have swelling in your ankles.  You have trouble with your vision. Get help right away if:  You develop a severe headache or confusion.  You have unusual weakness or numbness.  You feel faint.  You have severe pain in your chest or  abdomen.  You vomit repeatedly.  You have trouble breathing. Summary  Hypertension is when the force of blood pumping through your arteries is too strong. If this condition is not controlled, it may put you at risk for serious complications.  Your personal target blood pressure may vary depending on your medical conditions, your age, and other factors. For most people, a normal blood pressure is less than 120/80.  Hypertension is treated with lifestyle changes, medicines, or a combination of both. Lifestyle changes include weight loss, eating a healthy, low-sodium diet, exercising more, and limiting alcohol. This information is not intended to replace advice given to you by your health care provider. Make sure you discuss any questions you have with your health care provider. Document Released: 06/11/2005 Document  Revised: 05/09/2016 Document Reviewed: 05/09/2016 Elsevier Interactive Patient Education  2018 [place] Eating Plan DASH stands for "Dietary Approaches to Stop Hypertension." The DASH eating plan is a healthy eating plan that has been shown to reduce high blood pressure (hypertension). It may also reduce your risk for type 2 diabetes, heart disease, and stroke. The DASH eating plan may also help with weight loss. What are tips for following this plan? General guidelines  Avoid eating more than 2,300 mg (milligrams) of salt (sodium) a day. If you have hypertension, you may need to reduce your sodium intake to 1,500 mg a day.  Limit alcohol intake to no more than 1 drink a day for nonpregnant women and 2 drinks a day for men. One drink equals 12 oz of beer, 5 oz of wine, or 1 oz of hard liquor.  Work with your health care provider to maintain a healthy body weight or to lose weight. Ask what an ideal weight is for you.  Get at least 30 minutes of exercise that causes your heart to beat faster (aerobic exercise) most days of the week. Activities may include walking, swimming, or biking.  Work with your health care provider or diet and nutrition specialist (dietitian) to adjust your eating plan to your individual calorie needs. Reading food labels  Check food labels for the amount of sodium per serving. Choose foods with less than 5 percent of the Daily Value of sodium. Generally, foods with less than 300 mg of sodium per serving fit into this eating plan.  To find whole grains, look for the word "whole" as the first word in the ingredient list. Shopping  Buy products labeled as "low-sodium" or "no salt added."  Buy fresh foods. Avoid canned foods and premade or frozen meals. Cooking  Avoid adding salt when cooking. Use salt-free seasonings or herbs instead of table salt or sea salt. Check with your health care provider or pharmacist before using salt substitutes.  Do not fry  foods. Cook foods using healthy methods such as baking, boiling, grilling, and broiling instead.  Cook with heart-healthy oils, such as olive, canola, soybean, or sunflower oil. Meal planning   Eat a balanced diet that includes: ? 5 or more servings of fruits and vegetables each day. At each meal, try to fill half of your plate with fruits and vegetables. ? Up to 6-8 servings of whole grains each day. ? Less than 6 oz of lean meat, poultry, or fish each day. A 3-oz serving of meat is about the same size as a deck of cards. One egg equals 1 oz. ? 2 servings of low-fat dairy each day. ? A serving of nuts, seeds, or beans 5 times each week. ?  Heart-healthy fats. Healthy fats called Omega-3 fatty acids are found in foods such as flaxseeds and coldwater fish, like sardines, salmon, and mackerel.  Limit how much you eat of the following: ? Canned or prepackaged foods. ? Food that is high in trans fat, such as fried foods. ? Food that is high in saturated fat, such as fatty meat. ? Sweets, desserts, sugary drinks, and other foods with added sugar. ? Full-fat dairy products.  Do not salt foods before eating.  Try to eat at least 2 vegetarian meals each week.  Eat more home-cooked food and less restaurant, buffet, and fast food.  When eating at a restaurant, ask that your food be prepared with less salt or no salt, if possible. What foods are recommended? The items listed may not be a complete list. Talk with your dietitian about what dietary choices are best for you. Grains Whole-grain or whole-wheat bread. Whole-grain or whole-wheat pasta. Brown rice. [place]. Bulgur. Whole-grain and low-sodium cereals. Pita bread. Low-fat, low-sodium crackers. Whole-wheat flour tortillas. Vegetables Fresh or frozen vegetables (raw, steamed, roasted, or grilled). Low-sodium or reduced-sodium tomato and vegetable juice. Low-sodium or reduced-sodium tomato sauce and tomato paste. Low-sodium or  reduced-sodium canned vegetables. Fruits All fresh, dried, or frozen fruit. Canned fruit in natural juice (without added sugar). Meat and other protein foods Skinless chicken or [place]. Ground chicken or [place]. Pork with fat trimmed off. Fish and seafood. Egg whites. Dried beans, peas, or lentils. Unsalted nuts, nut butters, and seeds. Unsalted canned beans. Lean cuts of beef with fat trimmed off. Low-sodium, lean deli meat. Dairy Low-fat (1%) or fat-free (skim) milk. Fat-free, low-fat, or reduced-fat cheeses. Nonfat, low-sodium ricotta or cottage cheese. Low-fat or nonfat yogurt. Low-fat, low-sodium cheese. Fats and oils Soft margarine without trans fats. Vegetable oil. Low-fat, reduced-fat, or light mayonnaise and salad dressings (reduced-sodium). Canola, safflower, olive, soybean, and sunflower oils. Avocado. Seasoning and other foods Herbs. Spices. Seasoning mixes without salt. Unsalted popcorn and pretzels. Fat-free sweets. What foods are not recommended? The items listed may not be a complete list. Talk with your dietitian about what dietary choices are best for you. Grains Baked goods made with fat, such as croissants, muffins, or some breads. Dry pasta or rice meal packs. Vegetables Creamed or fried vegetables. Vegetables in a cheese sauce. Regular canned vegetables (not low-sodium or reduced-sodium). Regular canned tomato sauce and paste (not low-sodium or reduced-sodium). Regular tomato and vegetable juice (not low-sodium or reduced-sodium). Angie Fava. Olives. Fruits Canned fruit in a light or heavy syrup. Fried fruit. Fruit in cream or butter sauce. Meat and other protein foods Fatty cuts of meat. Ribs. Fried meat. Berniece Salines. Sausage. Bologna and other processed lunch meats. Salami. Fatback. Hotdogs. Bratwurst. Salted nuts and seeds. Canned beans with added salt. Canned or smoked fish. Whole eggs or egg yolks. Chicken or [place] with skin. Dairy Whole or 2% milk, cream, and half-and-half.  Whole or full-fat cream cheese. Whole-fat or sweetened yogurt. Full-fat cheese. Nondairy creamers. Whipped toppings. Processed cheese and cheese spreads. Fats and oils Butter. Stick margarine. Lard. Shortening. Ghee. Bacon fat. Tropical oils, such as coconut, palm kernel, or palm oil. Seasoning and other foods Salted popcorn and pretzels. Onion salt, garlic salt, seasoned salt, table salt, and sea salt. Worcestershire sauce. Tartar sauce. Barbecue sauce. Teriyaki sauce. Soy sauce, including reduced-sodium. Steak sauce. Canned and packaged gravies. Fish sauce. Oyster sauce. Cocktail sauce. Horseradish that you find on the shelf. Ketchup. Mustard. Meat flavorings and tenderizers. Bouillon cubes. Hot sauce and Tabasco sauce.  Premade or packaged marinades. Premade or packaged taco seasonings. Relishes. Regular salad dressings. Where to find more information:  National Heart, Lung, and [place]: https://wilson-eaton.com/  American Heart Association: www.heart.org Summary  The DASH eating plan is a healthy eating plan that has been shown to reduce high blood pressure (hypertension). It may also reduce your risk for type 2 diabetes, heart disease, and stroke.  With the DASH eating plan, you should limit salt (sodium) intake to 2,300 mg a day. If you have hypertension, you may need to reduce your sodium intake to 1,500 mg a day.  When on the DASH eating plan, aim to eat more fresh fruits and vegetables, whole grains, lean proteins, low-fat dairy, and heart-healthy fats.  Work with your health care provider or diet and nutrition specialist (dietitian) to adjust your eating plan to your individual calorie needs. This information is not intended to replace advice given to you by your health care provider. Make sure you discuss any questions you have with your health care provider. Document Released: 05/31/2011 Document Revised: 06/04/2016 Document Reviewed: 06/04/2016 Elsevier Interactive Patient Education   2017 [employer].

## 2017-04-26 LAB — COMPREHENSIVE METABOLIC PANEL
ALT: 12 IU/L (ref 0–32)
AST: 18 IU/L (ref 0–40)
Albumin/Globulin Ratio: 1.7 (ref 1.2–2.2)
Albumin: 4.5 g/dL (ref 3.6–4.8)
Alkaline Phosphatase: 88 IU/L (ref 39–117)
BUN/Creatinine Ratio: 20 (ref 12–28)
BUN: 12 mg/dL (ref 8–27)
Bilirubin Total: 0.2 mg/dL (ref 0.0–1.2)
CO2: 25 mmol/L (ref 20–29)
Calcium: 9.5 mg/dL (ref 8.7–10.3)
Chloride: 104 mmol/L (ref 96–106)
Creatinine, Ser: 0.6 mg/dL (ref 0.57–1.00)
GFR calc Af Amer: 113 mL/min/{1.73_m2} (ref >59)
GFR calc non Af Amer: 98 mL/min/{1.73_m2} (ref >59)
Globulin, Total: 2.6 g/dL (ref 1.5–4.5)
Glucose: 105 mg/dL — ABNORMAL HIGH (ref 65–99)
Potassium: 4.3 mmol/L (ref 3.5–5.2)
Sodium: 143 mmol/L (ref 134–144)
Total Protein: 7.1 g/dL (ref 6.0–8.5)

## 2017-04-26 LAB — CBC WITH DIFFERENTIAL/PLATELET
Basophils Absolute: 0.1 10*3/uL (ref 0.0–0.2)
Basos: 1 %
EOS (ABSOLUTE): 0.1 10*3/uL (ref 0.0–0.4)
Eos: 2 %
Hematocrit: 39.1 % (ref 34.0–46.6)
Hemoglobin: 12.7 g/dL (ref 11.1–15.9)
Immature Grans (Abs): 0 10*3/uL (ref 0.0–0.1)
Immature Granulocytes: 0 %
Lymphocytes Absolute: 2.9 10*3/uL (ref 0.7–3.1)
Lymphs: 31 %
MCH: 28.3 pg (ref 26.6–33.0)
MCHC: 32.5 g/dL (ref 31.5–35.7)
MCV: 87 fL (ref 79–97)
Monocytes Absolute: 0.3 10*3/uL (ref 0.1–0.9)
Monocytes: 3 %
Neutrophils Absolute: 5.9 10*3/uL (ref 1.4–7.0)
Neutrophils: 63 %
Platelets: 329 10*3/uL (ref 150–379)
RBC: 4.48 x10E6/uL (ref 3.77–5.28)
RDW: 13.8 % (ref 12.3–15.4)
WBC: 9.3 10*3/uL (ref 3.4–10.8)

## 2017-04-26 LAB — LIPID PANEL
Chol/HDL Ratio: 3.6 ratio (ref 0.0–4.4)
Cholesterol, Total: 247 mg/dL — ABNORMAL HIGH (ref 100–199)
HDL: 68 mg/dL (ref >39)
LDL Calculated: 163 mg/dL — ABNORMAL HIGH (ref 0–99)
Triglycerides: 82 mg/dL (ref 0–149)
VLDL Cholesterol Cal: 16 mg/dL (ref 5–40)

## 2017-04-26 LAB — ANA W/REFLEX: Anti Nuclear Antibody(ANA): NEGATIVE

## 2017-04-26 LAB — TSH: TSH: 1.24 u[IU]/mL (ref 0.450–4.500)

## 2017-04-27 LAB — CYCLIC CITRUL PEPTIDE ANTIBODY, IGG/IGA: Cyclic Citrullin Peptide Ab: 17 units (ref 0–19)

## 2017-04-29 ENCOUNTER — Telehealth (INDEPENDENT_AMBULATORY_CARE_PROVIDER_SITE_OTHER): Payer: Self-pay

## 2017-04-29 ENCOUNTER — Other Ambulatory Visit (INDEPENDENT_AMBULATORY_CARE_PROVIDER_SITE_OTHER): Payer: Self-pay | Admitting: Physician Assistant

## 2017-04-29 DIAGNOSIS — E785 Hyperlipidemia, unspecified: Secondary | ICD-10-CM

## 2017-04-29 MED ORDER — ATORVASTATIN CALCIUM 40 MG PO TABS: 40.0000 mg | ORAL_TABLET | Freq: Every day | ORAL | 3 refills | Status: DC

## 2017-04-29 NOTE — Telephone Encounter (Signed)
-----   Message from Clent Demark, PA-C sent at 04/29/2017 12:37 PM EST ----- Negative for RA and autoimmune disease. Rest of labs unremarkable except for elevated cholesterol. I will recommend atorvastatin 40 mg instead of the 20 mg that she is currently taking. I am sending new script for atorvastatin to [place].

## 2017-04-29 NOTE — Telephone Encounter (Signed)
Patient aware of negative RA and autoimmune disease, and all labs normal except elevated cholesterol. Patient aware of atorvastatin being increased to 40mg . Nat Christen, CMA

## 2017-05-01 ENCOUNTER — Other Ambulatory Visit (INDEPENDENT_AMBULATORY_CARE_PROVIDER_SITE_OTHER): Payer: Self-pay | Admitting: Physician Assistant

## 2017-05-01 DIAGNOSIS — R195 Other fecal abnormalities: Secondary | ICD-10-CM

## 2017-05-01 LAB — FECAL OCCULT BLOOD, IMMUNOCHEMICAL: Fecal Occult Bld: POSITIVE — AB

## 2017-05-01 NOTE — Progress Notes (Signed)
Positive FIT

## 2017-05-07 ENCOUNTER — Telehealth: Payer: Self-pay | Admitting: *Deleted

## 2017-05-07 NOTE — Telephone Encounter (Signed)
Noted  

## 2017-05-07 NOTE — Telephone Encounter (Signed)
Notes recorded by Clent Demark, PA-C on 05/01/2017 at 5:32 PM EST Fecal occult blood positive. I am making referral for colonoscopy.  Notes recorded by Clent Demark, PA-C on 04/29/2017 at 12:37 PM EST Negative for RA and autoimmune disease. Rest of labs unremarkable except for elevated cholesterol. I will recommend atorvastatin 40 mg instead of the 20 mg that she is currently taking. I am sending new script for atorvastatin to [place]  Pt aware of result of above labs. Verified understanding to diet changes and to medication dose change. Aware to call office if she doesn't hear about appointment for colonoscopy w/i 2 weeks.

## 2017-05-08 ENCOUNTER — Other Ambulatory Visit (INDEPENDENT_AMBULATORY_CARE_PROVIDER_SITE_OTHER): Payer: Self-pay | Admitting: Physician Assistant

## 2017-05-08 DIAGNOSIS — R195 Other fecal abnormalities: Secondary | ICD-10-CM

## 2017-05-14 MED FILL — ?ATORVASTATIN 40MG TABLET: 40 | 30 days supply | Qty: 30 | Fill #0 | Status: TO

## 2017-05-23 ENCOUNTER — Other Ambulatory Visit: Payer: Self-pay

## 2017-05-23 ENCOUNTER — Encounter (INDEPENDENT_AMBULATORY_CARE_PROVIDER_SITE_OTHER): Payer: Self-pay | Admitting: Physician Assistant

## 2017-05-23 ENCOUNTER — Ambulatory Visit (INDEPENDENT_AMBULATORY_CARE_PROVIDER_SITE_OTHER): Payer: Self-pay | Admitting: Physician Assistant

## 2017-05-23 VITALS — BP 157/96 | HR 90 | Temp 98.6°F | Wt 106.8 lb

## 2017-05-23 DIAGNOSIS — I10 Essential (primary) hypertension: Secondary | ICD-10-CM

## 2017-05-23 DIAGNOSIS — L819 Disorder of pigmentation, unspecified: Secondary | ICD-10-CM

## 2017-05-23 DIAGNOSIS — Z1159 Encounter for screening for other viral diseases: Secondary | ICD-10-CM

## 2017-05-23 DIAGNOSIS — R232 Flushing: Secondary | ICD-10-CM

## 2017-05-23 DIAGNOSIS — Z114 Encounter for screening for human immunodeficiency virus [HIV]: Secondary | ICD-10-CM

## 2017-05-23 MED ORDER — PAROXETINE HCL 10 MG PO TABS: 10.0000 mg | ORAL_TABLET | Freq: Every day | ORAL | 2 refills | Status: DC

## 2017-05-23 MED FILL — PARoxetine HCL 10 MG TABS: 10 | 30 days supply | Qty: 30 | Fill #0 | Status: TO

## 2017-05-23 NOTE — Progress Notes (Signed)
Subjective:  Patient ID: [name], female    DOB: 1955-09-23  Age: 61 y.o. MRN: [id number]  CC:  HTN and foot pain   HPI:  [name] is a 61 y.o. female with a medical history of HLD and HTN presents on f/u of HTN. Reports taking medicines as prescribed except for today because she was rushing to get to the appointment here. Does not endorse CP, palpitations, SOB, HA, abdominal pain, tingling, numbness, edema, presyncope, syncope, or GI/GU sxs.     Hot flashes throughout the day and night. FIT testing was positive. She has been referred to gastroenterology. She is now waiting on approval of Financial Aid to have colonoscopy done. She remains in contact with the gastroenterologist and will call him when she is approved for [facility] financial assistance.      Outpatient Medications Prior to Visit  Medication Sig Dispense Refill  . amLODipine (NORVASC) 10 MG tablet Take 1 tablet (10 mg total) by mouth daily. 30 tablet 0  . aspirin EC 81 MG tablet Take 1 tablet (81 mg total) by mouth daily. 90 tablet 3  . atorvastatin (LIPITOR) 40 MG tablet Take 1 tablet (40 mg total) daily by mouth. 90 tablet 3  . lisinopril-hydrochlorothiazide (ZESTORETIC) 10-12.5 MG tablet Take 2 tablets by mouth daily. 60 tablet 0  . metoprolol succinate (TOPROL-XL) 25 MG 24 hr tablet Take 2 tablets (50 mg total) by mouth daily. 60 tablet 0  . naproxen (NAPROSYN) 500 MG tablet Take 1 tablet (500 mg total) by mouth 2 (two) times daily with a meal. 30 tablet 0   No facility-administered medications prior to visit.      ROS Review of Systems  Constitutional: Negative for chills, fever and malaise/fatigue.  Eyes: Negative for blurred vision.  Respiratory: Negative for shortness of breath.   Cardiovascular: Negative for chest pain and palpitations.  Gastrointestinal: Negative for abdominal pain and nausea.  Genitourinary: Negative for dysuria and hematuria.  Musculoskeletal: Negative for joint pain and  myalgias.  Skin: Negative for rash.       Dark spots on soles of feet  Neurological: Negative for tingling and headaches.  Psychiatric/Behavioral: Negative for depression. The patient is not nervous/anxious.     Objective:  BP (!) 157/96 (BP Location: Left Arm, Patient Position: Sitting, Cuff Size: Normal)   Pulse 90   Temp 98.6 F (37 C) (Oral)   Wt 106 lb 12.8 oz (48.4 kg)   SpO2 100%   BMI 19.53 kg/m   BP/Weight 05/23/2017 04/25/2017 15/17/6160  Systolic BP 737 106 269  Diastolic BP 96 98 485  Wt. (Lbs) 106.8 108.8 116  BMI 19.53 19.9 21.22      Physical Exam  Constitutional: She is oriented to person, place, and time.  Well developed, thin, NAD, polite  HENT:  Head: Normocephalic and atraumatic.  Eyes: No scleral icterus.  Neck: Normal range of motion. Neck supple. No thyromegaly present.  Cardiovascular: Normal rate, regular rhythm and normal heart sounds.  Pulmonary/Chest: Effort normal and breath sounds normal.  Musculoskeletal: She exhibits no edema.  Neurological: She is alert and oriented to person, place, and time. No cranial nerve deficit. Coordination normal.  Skin: Skin is warm and dry. No rash noted. No erythema. No pallor.  Hyperpigmented macules on the soles of feet and few on palms.  Psychiatric: She has a normal mood and affect. Her behavior is normal. Thought content normal.  Vitals reviewed.    Assessment & Plan:  1. Hypertension, unspecified type - Advised to take anti-hypertensives as directed and to return for a BP check.  2. Hyperpigmented skin lesion - RPR  3. Hot flashes - PARoxetine (PAXIL) 10 MG tablet; Take 1 tablet (10 mg total) by mouth daily.  Dispense: 30 tablet; Refill: 2  4. Encounter for screening for HIV - HIV antibody  5. Need for hepatitis C screening test - Hepatitis c antibody (reflex)   Meds ordered this encounter  Medications  . PARoxetine (PAXIL) 10 MG tablet    Sig: Take 1 tablet (10 mg total) by mouth  daily.    Dispense:  30 tablet    Refill:  2    Order Specific Question:   Supervising Provider    Answer:   Tresa Garter [id number]    Follow-up: Return in about 2 weeks (around 06/06/2017) for HTN, BP check.   Clent Demark PA

## 2017-05-23 NOTE — Patient Instructions (Signed)
Menopause Menopause is the normal time of life when menstrual periods stop completely. Menopause is complete when you have missed 12 consecutive menstrual periods. It usually occurs between the ages of 48 years and 55 years. Very rarely does a woman develop menopause before the age of 40 years. At menopause, your ovaries stop producing the female hormones estrogen and progesterone. This can cause undesirable symptoms and also affect your health. Sometimes the symptoms may occur 4-5 years before the menopause begins. There is no relationship between menopause and:  Oral contraceptives.  Number of children you had.  Race.  The age your menstrual periods started (menarche).  Heavy smokers and very thin women may develop menopause earlier in life. What are the causes?  The ovaries stop producing the female hormones estrogen and progesterone. Other causes include:  Surgery to remove both ovaries.  The ovaries stop functioning for no known reason.  Tumors of the pituitary gland in the brain.  Medical disease that affects the ovaries and hormone production.  Radiation treatment to the abdomen or pelvis.  Chemotherapy that affects the ovaries.  What are the signs or symptoms?  Hot flashes.  Night sweats.  Decrease in sex drive.  Vaginal dryness and thinning of the vagina causing painful intercourse.  Dryness of the skin and developing wrinkles.  Headaches.  Tiredness.  Irritability.  Memory problems.  Weight gain.  Bladder infections.  Hair growth of the face and chest.  Infertility. More serious symptoms include:  Loss of bone (osteoporosis) causing breaks (fractures).  Depression.  Hardening and narrowing of the arteries (atherosclerosis) causing heart attacks and strokes.  How is this diagnosed?  When the menstrual periods have stopped for 12 straight months.  Physical exam.  Hormone studies of the blood. How is this treated? There are many treatment  choices and nearly as many questions about them. The decisions to treat or not to treat menopausal changes is an individual choice made with your health care provider. Your health care provider can discuss the treatments with you. Together, you can decide which treatment will work best for you. Your treatment choices may include:  Hormone therapy (estrogen and progesterone).  Non-hormonal medicines.  Treating the individual symptoms with medicine (for example antidepressants for depression).  Herbal medicines that may help specific symptoms.  Counseling by a psychiatrist or psychologist.  Group therapy.  Lifestyle changes including: ? Eating healthy. ? Regular exercise. ? Limiting caffeine and alcohol. ? Stress management and meditation.  No treatment.  Follow these instructions at home:  Take the medicine your health care provider gives you as directed.  Get plenty of sleep and rest.  Exercise regularly.  Eat a diet that contains calcium (good for the bones) and soy products (acts like estrogen hormone).  Avoid alcoholic beverages.  Do not smoke.  If you have hot flashes, dress in layers.  Take supplements, calcium, and vitamin D to strengthen bones.  You can use over-the-counter lubricants or moisturizers for vaginal dryness.  Group therapy is sometimes very helpful.  Acupuncture may be helpful in some cases. Contact a health care provider if:  You are not sure you are in menopause.  You are having menopausal symptoms and need advice and treatment.  You are still having menstrual periods after age 55 years.  You have pain with intercourse.  Menopause is complete (no menstrual period for 12 months) and you develop vaginal bleeding.  You need a referral to a specialist (gynecologist, psychiatrist, or psychologist) for treatment. Get help right   away if:  You have severe depression.  You have excessive vaginal bleeding.  You fell and think you have a  broken bone.  You have pain when you urinate.  You develop leg or chest pain.  You have a fast pounding heart beat (palpitations).  You have severe headaches.  You develop vision problems.  You feel a lump in your breast.  You have abdominal pain or severe indigestion. This information is not intended to replace advice given to you by your health care provider. Make sure you discuss any questions you have with your health care provider. Document Released: 09/01/2003 Document Revised: 11/17/2015 Document Reviewed: 01/08/2013 Elsevier Interactive Patient Education  2017 [employer]  

## 2017-05-24 LAB — RPR: RPR Ser Ql: NONREACTIVE

## 2017-05-24 LAB — HEPATITIS C ANTIBODY (REFLEX): HCV Ab: <0.1 s/co ratio (ref 0.0–0.9)

## 2017-05-24 LAB — HIV ANTIBODY (ROUTINE TESTING W REFLEX): HIV Screen 4th Generation wRfx: NONREACTIVE

## 2017-05-24 LAB — HCV COMMENT:

## 2017-05-28 ENCOUNTER — Telehealth (INDEPENDENT_AMBULATORY_CARE_PROVIDER_SITE_OTHER): Payer: Self-pay

## 2017-05-28 NOTE — Telephone Encounter (Signed)
-----   Message from Clent Demark, PA-C sent at 05/27/2017  1:56 PM EST ----- Negative for syphilis, HIV, and HepC.

## 2017-05-28 NOTE — Telephone Encounter (Signed)
Left a message asking patient to call the office. Nat Christen, CMA

## 2017-06-06 ENCOUNTER — Encounter (INDEPENDENT_AMBULATORY_CARE_PROVIDER_SITE_OTHER): Payer: Self-pay

## 2017-06-18 ENCOUNTER — Emergency Department (HOSPITAL_COMMUNITY): Payer: Self-pay

## 2017-06-18 ENCOUNTER — Inpatient Hospital Stay (HOSPITAL_COMMUNITY)
Admission: EM | Admit: 2017-06-18 | Discharge: 2017-06-22 | DRG: 392 | Disposition: A | Discharge status: Home / Self Care | Payer: Self-pay | Attending: Internal Medicine | Admitting: Internal Medicine

## 2017-06-18 ENCOUNTER — Encounter (HOSPITAL_COMMUNITY): Payer: Self-pay

## 2017-06-18 ENCOUNTER — Other Ambulatory Visit: Payer: Self-pay

## 2017-06-18 DIAGNOSIS — I671 Cerebral aneurysm, nonruptured: Secondary | ICD-10-CM | POA: Diagnosis present

## 2017-06-18 DIAGNOSIS — F32A Depression, unspecified: Secondary | ICD-10-CM

## 2017-06-18 DIAGNOSIS — K5900 Constipation, unspecified: Secondary | ICD-10-CM | POA: Diagnosis present

## 2017-06-18 DIAGNOSIS — Z79899 Other long term (current) drug therapy: Secondary | ICD-10-CM

## 2017-06-18 DIAGNOSIS — R7309 Other abnormal glucose: Secondary | ICD-10-CM

## 2017-06-18 DIAGNOSIS — G459 Transient cerebral ischemic attack, unspecified: Secondary | ICD-10-CM | POA: Diagnosis present

## 2017-06-18 DIAGNOSIS — Z8673 Personal history of transient ischemic attack (TIA), and cerebral infarction without residual deficits: Secondary | ICD-10-CM

## 2017-06-18 DIAGNOSIS — R109 Unspecified abdominal pain: Secondary | ICD-10-CM | POA: Diagnosis present

## 2017-06-18 DIAGNOSIS — A09 Infectious gastroenteritis and colitis, unspecified: Principal | ICD-10-CM | POA: Diagnosis present

## 2017-06-18 DIAGNOSIS — R1084 Generalized abdominal pain: Secondary | ICD-10-CM

## 2017-06-18 DIAGNOSIS — R112 Nausea with vomiting, unspecified: Secondary | ICD-10-CM

## 2017-06-18 DIAGNOSIS — I10 Essential (primary) hypertension: Secondary | ICD-10-CM | POA: Diagnosis present

## 2017-06-18 DIAGNOSIS — E785 Hyperlipidemia, unspecified: Secondary | ICD-10-CM

## 2017-06-18 DIAGNOSIS — R7301 Impaired fasting glucose: Secondary | ICD-10-CM

## 2017-06-18 DIAGNOSIS — R7989 Other specified abnormal findings of blood chemistry: Secondary | ICD-10-CM | POA: Diagnosis present

## 2017-06-18 DIAGNOSIS — Z7982 Long term (current) use of aspirin: Secondary | ICD-10-CM

## 2017-06-18 DIAGNOSIS — F329 Major depressive disorder, single episode, unspecified: Secondary | ICD-10-CM | POA: Diagnosis present

## 2017-06-18 DIAGNOSIS — Z9049 Acquired absence of other specified parts of digestive tract: Secondary | ICD-10-CM

## 2017-06-18 DIAGNOSIS — R7303 Prediabetes: Secondary | ICD-10-CM | POA: Diagnosis present

## 2017-06-18 DIAGNOSIS — K529 Noninfective gastroenteritis and colitis, unspecified: Secondary | ICD-10-CM

## 2017-06-18 DIAGNOSIS — E876 Hypokalemia: Secondary | ICD-10-CM

## 2017-06-18 HISTORY — DX: Unspecified osteoarthritis, unspecified site: M19.90

## 2017-06-18 HISTORY — DX: Transient cerebral ischemic attack, unspecified: G45.9

## 2017-06-18 HISTORY — DX: Anemia, unspecified: D64.9

## 2017-06-18 LAB — CBC WITH DIFFERENTIAL/PLATELET
Basophils Absolute: 0 10*3/uL (ref 0.0–0.1)
Basophils Relative: 0 %
Eosinophils Absolute: 0.2 10*3/uL (ref 0.0–0.7)
Eosinophils Relative: 1 %
HCT: 38.1 % (ref 36.0–46.0)
Hemoglobin: 12.8 g/dL (ref 12.0–15.0)
Lymphocytes Relative: 21 %
Lymphs Abs: 3.4 10*3/uL (ref 0.7–4.0)
MCH: 28.5 pg (ref 26.0–34.0)
MCHC: 33.6 g/dL (ref 30.0–36.0)
MCV: 84.9 fL (ref 78.0–100.0)
Monocytes Absolute: 0.7 10*3/uL (ref 0.1–1.0)
Monocytes Relative: 4 %
Neutro Abs: 12 10*3/uL — ABNORMAL HIGH (ref 1.7–7.7)
Neutrophils Relative %: 74 %
Platelets: 406 10*3/uL — ABNORMAL HIGH (ref 150–400)
RBC: 4.49 MIL/uL (ref 3.87–5.11)
RDW: 13.6 % (ref 11.5–15.5)
WBC: 16.3 10*3/uL — ABNORMAL HIGH (ref 4.0–10.5)

## 2017-06-18 LAB — URINALYSIS, ROUTINE W REFLEX MICROSCOPIC
Bilirubin Urine: NEGATIVE
Glucose, UA: 150 mg/dL — AB
Hgb urine dipstick: NEGATIVE
Ketones, ur: 20 mg/dL — AB
Leukocytes, UA: NEGATIVE
Nitrite: NEGATIVE
Protein, ur: NEGATIVE mg/dL
Specific Gravity, Urine: 1.01 (ref 1.005–1.030)
pH: 8 (ref 5.0–8.0)

## 2017-06-18 LAB — I-STAT TROPONIN, ED: Troponin i, poc: 0 ng/mL (ref 0.00–0.08)

## 2017-06-18 LAB — COMPREHENSIVE METABOLIC PANEL
ALT: 32 U/L (ref 14–54)
AST: 29 U/L (ref 15–41)
Albumin: 4.2 g/dL (ref 3.5–5.0)
Alkaline Phosphatase: 170 U/L — ABNORMAL HIGH (ref 38–126)
Anion gap: 16 — ABNORMAL HIGH (ref 5–15)
BUN: 12 mg/dL (ref 6–20)
CO2: 22 mmol/L (ref 22–32)
Calcium: 10.1 mg/dL (ref 8.9–10.3)
Chloride: 102 mmol/L (ref 101–111)
Creatinine, Ser: 0.64 mg/dL (ref 0.44–1.00)
GFR calc Af Amer: >60 mL/min (ref >60)
GFR calc non Af Amer: >60 mL/min (ref >60)
Glucose, Bld: 208 mg/dL — ABNORMAL HIGH (ref 65–99)
Potassium: 3.1 mmol/L — ABNORMAL LOW (ref 3.5–5.1)
Sodium: 140 mmol/L (ref 135–145)
Total Bilirubin: 0.7 mg/dL (ref 0.3–1.2)
Total Protein: 8.2 g/dL — ABNORMAL HIGH (ref 6.5–8.1)

## 2017-06-18 LAB — GLUCOSE, CAPILLARY
Glucose-Capillary: 114 mg/dL — ABNORMAL HIGH (ref 65–99)
Glucose-Capillary: 81 mg/dL (ref 65–99)

## 2017-06-18 LAB — LIPID PANEL
Cholesterol: 186 mg/dL (ref 0–200)
HDL: 70 mg/dL (ref >40)
LDL Cholesterol: 104 mg/dL — ABNORMAL HIGH (ref 0–99)
Total CHOL/HDL Ratio: 2.7 RATIO
Triglycerides: 62 mg/dL (ref <150)
VLDL: 12 mg/dL (ref 0–40)

## 2017-06-18 LAB — MAGNESIUM: Magnesium: 2 mg/dL (ref 1.7–2.4)

## 2017-06-18 LAB — LIPASE, BLOOD: Lipase: 25 U/L (ref 11–51)

## 2017-06-18 MED ORDER — HYDRALAZINE HCL 20 MG/ML IJ SOLN: 5.0000 mg | Freq: Three times a day (TID) | INTRAMUSCULAR | Status: DC | PRN

## 2017-06-18 MED ORDER — LISINOPRIL 10 MG PO TABS: 10.0000 mg | ORAL_TABLET | Freq: Every day | ORAL | Status: DC

## 2017-06-18 MED ORDER — ONDANSETRON HCL 4 MG/2ML IJ SOLN
4.0000 mg | Freq: Four times a day (QID) | INTRAMUSCULAR | Status: DC | PRN
  Administered 2017-06-18 – 2017-06-22 (×4): 4 mg via INTRAVENOUS
  Filled 2017-06-18 (×4): qty 2

## 2017-06-18 MED ORDER — LISINOPRIL 20 MG PO TABS
20.0000 mg | ORAL_TABLET | Freq: Every day | ORAL | Status: DC
  Administered 2017-06-18: 20 mg via ORAL
  Filled 2017-06-18: qty 1

## 2017-06-18 MED ORDER — ONDANSETRON HCL 4 MG/2ML IJ SOLN
4.0000 mg | Freq: Once | INTRAMUSCULAR | Status: AC
  Administered 2017-06-18: 4 mg via INTRAVENOUS
  Filled 2017-06-18: qty 2

## 2017-06-18 MED ORDER — IOPAMIDOL (ISOVUE-300) INJECTION 61%
INTRAVENOUS | Status: AC
  Administered 2017-06-18: 100 mL
  Filled 2017-06-18: qty 100

## 2017-06-18 MED ORDER — SENNOSIDES-DOCUSATE SODIUM 8.6-50 MG PO TABS: 1.0000 | ORAL_TABLET | Freq: Every evening | ORAL | Status: DC | PRN

## 2017-06-18 MED ORDER — HYDROCHLOROTHIAZIDE 12.5 MG PO CAPS: 12.5000 mg | ORAL_CAPSULE | Freq: Every day | ORAL | Status: DC

## 2017-06-18 MED ORDER — AMLODIPINE BESYLATE 10 MG PO TABS
10.0000 mg | ORAL_TABLET | Freq: Every day | ORAL | Status: DC
  Administered 2017-06-19 – 2017-06-22 (×4): 10 mg via ORAL
  Filled 2017-06-18 (×4): qty 1

## 2017-06-18 MED ORDER — BISACODYL 10 MG RE SUPP: 10.0000 mg | Freq: Every day | RECTAL | Status: DC | PRN

## 2017-06-18 MED ORDER — ASPIRIN EC 81 MG PO TBEC
81.0000 mg | DELAYED_RELEASE_TABLET | Freq: Every day | ORAL | Status: DC
Start: 2017-06-19 — End: 2017-06-22
  Administered 2017-06-19 – 2017-06-22 (×4): 81 mg via ORAL
  Filled 2017-06-18 (×4): qty 1

## 2017-06-18 MED ORDER — HEPARIN SODIUM (PORCINE) 5000 UNIT/ML IJ SOLN: 5000.0000 [IU] | Freq: Three times a day (TID) | INTRAMUSCULAR | Status: DC

## 2017-06-18 MED ORDER — ONDANSETRON HCL 4 MG PO TABS
4.0000 mg | ORAL_TABLET | Freq: Four times a day (QID) | ORAL | Status: DC | PRN
Start: 2017-06-18 — End: 2017-06-18

## 2017-06-18 MED ORDER — SODIUM CHLORIDE 0.9 % IV SOLN
INTRAVENOUS | Status: AC
  Administered 2017-06-19 (×2): via INTRAVENOUS

## 2017-06-18 MED ORDER — PROMETHAZINE HCL 25 MG/ML IJ SOLN
12.5000 mg | Freq: Once | INTRAMUSCULAR | Status: AC
  Administered 2017-06-18: 12.5 mg via INTRAVENOUS
  Filled 2017-06-18: qty 1

## 2017-06-18 MED ORDER — POTASSIUM CHLORIDE 10 MEQ/100ML IV SOLN
10.0000 meq | INTRAVENOUS | Status: AC
  Administered 2017-06-18: 10 meq via INTRAVENOUS
  Filled 2017-06-18: qty 100

## 2017-06-18 MED ORDER — SODIUM CHLORIDE 0.9 % IV SOLN
INTRAVENOUS | Status: DC
  Administered 2017-06-18: 17:00:00 via INTRAVENOUS

## 2017-06-18 MED ORDER — METOCLOPRAMIDE HCL 5 MG/ML IJ SOLN
5.0000 mg | Freq: Four times a day (QID) | INTRAMUSCULAR | Status: DC
  Administered 2017-06-18 – 2017-06-19 (×3): 5 mg via INTRAVENOUS
  Filled 2017-06-18 (×3): qty 2

## 2017-06-18 MED ORDER — ACETAMINOPHEN 650 MG RE SUPP: 650.0000 mg | Freq: Four times a day (QID) | RECTAL | Status: DC | PRN

## 2017-06-18 MED ORDER — ONDANSETRON HCL 4 MG/2ML IJ SOLN
4.0000 mg | Freq: Four times a day (QID) | INTRAMUSCULAR | Status: DC | PRN
  Administered 2017-06-18: 4 mg via INTRAVENOUS
  Filled 2017-06-18: qty 2

## 2017-06-18 MED ORDER — FENTANYL CITRATE (PF) 100 MCG/2ML IJ SOLN
50.0000 ug | Freq: Once | INTRAMUSCULAR | Status: AC
  Administered 2017-06-18: 50 ug via INTRAVENOUS
  Filled 2017-06-18: qty 2

## 2017-06-18 MED ORDER — SODIUM CHLORIDE 0.9 % IV BOLUS (SEPSIS)
1000.0000 mL | Freq: Once | INTRAVENOUS | Status: AC
  Administered 2017-06-18: 1000 mL via INTRAVENOUS

## 2017-06-18 MED ORDER — ACETAMINOPHEN 325 MG PO TABS
650.0000 mg | ORAL_TABLET | Freq: Four times a day (QID) | ORAL | Status: DC | PRN
  Administered 2017-06-20: 650 mg via ORAL
  Filled 2017-06-18: qty 2

## 2017-06-18 MED ORDER — MORPHINE SULFATE (PF) 4 MG/ML IV SOLN
1.0000 mg | INTRAVENOUS | Status: DC | PRN
Start: 2017-06-18 — End: 2017-06-22

## 2017-06-18 MED ORDER — POTASSIUM CHLORIDE 10 MEQ/100ML IV SOLN
INTRAVENOUS | Status: AC
  Administered 2017-06-18: 10 meq
  Filled 2017-06-18: qty 100

## 2017-06-18 MED ORDER — ATORVASTATIN CALCIUM 40 MG PO TABS
40.0000 mg | ORAL_TABLET | Freq: Every day | ORAL | Status: DC
  Administered 2017-06-19 – 2017-06-21 (×3): 40 mg via ORAL
  Filled 2017-06-18 (×3): qty 1

## 2017-06-18 MED ORDER — INSULIN ASPART 100 UNIT/ML ~~LOC~~ SOLN
0.0000 [IU] | Freq: Three times a day (TID) | SUBCUTANEOUS | Status: DC
  Administered 2017-06-19: 1 [IU] via SUBCUTANEOUS

## 2017-06-18 MED ORDER — SODIUM CHLORIDE 0.9 % IV BOLUS (SEPSIS)
500.0000 mL | Freq: Once | INTRAVENOUS | Status: AC
  Administered 2017-06-18: 500 mL via INTRAVENOUS

## 2017-06-18 MED ORDER — PAROXETINE HCL 10 MG PO TABS
10.0000 mg | ORAL_TABLET | Freq: Every day | ORAL | Status: DC
  Administered 2017-06-19 – 2017-06-22 (×4): 10 mg via ORAL
  Filled 2017-06-18 (×4): qty 1

## 2017-06-18 MED ORDER — LISINOPRIL-HYDROCHLOROTHIAZIDE 10-12.5 MG PO TABS: 2.0000 | ORAL_TABLET | Freq: Every day | ORAL | Status: DC

## 2017-06-18 MED ORDER — HYDROCODONE-ACETAMINOPHEN 5-325 MG PO TABS
1.0000 | ORAL_TABLET | ORAL | Status: DC | PRN
  Administered 2017-06-19: 1 via ORAL
  Filled 2017-06-18: qty 1

## 2017-06-18 MED ORDER — METOPROLOL SUCCINATE ER 50 MG PO TB24
50.0000 mg | ORAL_TABLET | Freq: Every day | ORAL | Status: DC
  Administered 2017-06-19 – 2017-06-22 (×4): 50 mg via ORAL
  Filled 2017-06-18 (×4): qty 1

## 2017-06-18 MED ORDER — HYDROCHLOROTHIAZIDE 25 MG PO TABS
25.0000 mg | ORAL_TABLET | Freq: Every day | ORAL | Status: DC
  Administered 2017-06-18: 25 mg via ORAL
  Filled 2017-06-18: qty 1

## 2017-06-18 NOTE — H&P (Signed)
History and Physical    [name] BHA:[id number] DOB: 1955/12/24 DOA: 06/18/2017   PCP: Clent Demark, PA-C   Patient coming from:  Home    Chief Complaint: Intractable nausea and vomiting  HPI: [name] is a 61 y.o. female with medical history significant for hypertension, hyperlipidemia, depression, prior history of TIA, history of cerebral aneurysm, presenting with sudden onset of nausea and vomiting since 3 AM this morning.  She does report a history of similar symptoms in the past, but they were less severe, for which she did not seek medical attention.  In fact, as she was feeling weak, and shivering, she thought she had the flu, and initially did not wish to come to the hospital.  The patient reports generalized abdominal discomfort with vomiting, and radiates to her back.  She also becomes very nauseous and begins to vomit once she gets up.  If she does not move, her symptoms are less evident.  She denies any vertigo.  She denies any headache, or vision changes.  She denies any dizziness, numbness or unilateral weakness.  She denies any cough, fever or congestion.  She denies any myalgias or urinary symptoms, or hematuria.  She denies any sick contacts, or food poisoning.  However, she does admit to food indiscretion.  The patient denies any history of colon cancer or adhesions.  Denies any history of hepatitis or pancreatic.  She is able to pass gas, she has mild constipation, but she was able to pass a small amount of stool this morning.  She denies any diarrhea.  She denies any recent long distance trips.    ED Course:  BP (!) 167/90   Pulse 64   Temp 98.7 F (37.1 C) (Oral)   Resp 19   Ht 5\' 2"  (1.575 m)   Wt 49.9 kg (110 lb)   SpO2 100%   BMI 20.12 kg/m   The patient has been vomiting frequently since presentation.  Zofran, and Phenergan were tried, without significant relief.  She has just received Reglan, with mild alleviation of her symptoms.She has  also received IVF and pain ccontrol. Potassium 3.1 Glucose 208, anion gap 16 Alkaline phosphatase 170, Urinalysis is remarkable for ketones,  White count 16.3 CT of the abdomen and pelvis is remarkable for enteritis. She will be admitted for management of her symptoms.  Review of Systems:  As per HPI otherwise all other systems reviewed and are negative  Past Medical History:  Diagnosis Date  . Hyperlipidemia   . Hypertension     Past Surgical History:  Procedure Laterality Date  . CESAREAN SECTION    . CHOLECYSTECTOMY    . IR GENERIC HISTORICAL  09/21/2014   IR ANGIO INTRA EXTRACRAN SEL INTERNAL CAROTID UNI L MOD SED 09/21/2014 [name], MD [facility]  . IR GENERIC HISTORICAL  09/21/2014   IR ANGIO INTRA EXTRACRAN SEL COM CAROTID INNOMINATE UNI R MOD SED 09/21/2014 [name], MD [facility]  . IR GENERIC HISTORICAL  09/21/2014   IR ANGIO VERTEBRAL SEL VERTEBRAL UNI L MOD SED 09/21/2014 [name], MD [facility]  . IR GENERIC HISTORICAL  09/21/2014   IR 3D INDEPENDENT WKST 09/21/2014 [name], MD [facility]  . NECK SURGERY      Social History Social History   Socioeconomic History  . Marital status: Widowed    Spouse name: Not on file  . Number of children: Not on file  . Years of education: Not on file  .  Highest education level: Not on file  Social Needs  . Financial resource strain: Not on file  . Food insecurity - worry: Not on file  . Food insecurity - inability: Not on file  . Transportation needs - medical: Not on file  . Transportation needs - non-medical: Not on file  Occupational History  . Not on file  Tobacco Use  . Smoking status: Never Smoker  . Smokeless tobacco: Never Used  Substance and Sexual Activity  . Alcohol use: Yes    Comment: ocassionally  . Drug use: No  . Sexual activity: Yes  Other Topics Concern  . 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 Hypertension Mother   . Arthritis Mother   . Diabetes Mother   . Heart disease Father   . Diabetes Sister   . Diabetes Brother   . Diabetes Sister   . Cancer Neg Hx       Prior to Admission medications   Medication Sig Start Date End Date Taking? Authorizing Provider  amLODipine (NORVASC) 10 MG tablet Take 1 tablet (10 mg total) by mouth daily. 04/25/17   Clent Demark, PA-C  aspirin EC 81 MG tablet Take 1 tablet (81 mg total) by mouth daily. 02/01/16   Funches, Adriana Mccallum, MD  atorvastatin (LIPITOR) 40 MG tablet Take 1 tablet (40 mg total) daily by mouth. 04/29/17   Clent Demark, PA-C  lisinopril-hydrochlorothiazide (ZESTORETIC) 10-12.5 MG tablet Take 2 tablets by mouth daily. 04/25/17   Clent Demark, PA-C  metoprolol succinate (TOPROL-XL) 25 MG 24 hr tablet Take 2 tablets (50 mg total) by mouth daily. 04/25/17   Clent Demark, PA-C  naproxen (NAPROSYN) 500 MG tablet Take 1 tablet (500 mg total) by mouth 2 (two) times daily with a meal. 04/25/17   Clent Demark, PA-C  PARoxetine (PAXIL) 10 MG tablet Take 1 tablet (10 mg total) by mouth daily. 05/23/17   Clent Demark, PA-C    Physical Exam:  Vitals:   06/18/17 1245 06/18/17 1326 06/18/17 1345 06/18/17 1430  BP: (!) 155/77 (!) 184/95 (!) 180/97 (!) 167/90  Pulse: 63 81 68 64  Resp: 16 12 19    Temp:      TempSrc:      SpO2: 95% 97% 96% 100%  Weight:      Height:       Constitutional: Ill appearing, in moderate degree of discomfort due to intractable nausea and vomiting  eyes: PERRL, lids and conjunctivae normal.  She prefers keeping her eyes closed at this time. ENMT: Mucous membranes are dry, without exudate or lesions  Neck: normal, supple, no masses, no thyromegaly Respiratory: clear to auscultation bilaterally, no wheezing, no crackles. Normal respiratory  effort  Cardiovascular: Regular rate and rhythm,  murmur, rubs or gallops. No extremity edema. 2+ pedal pulses. No carotid bruits.  Abdomen: Soft, tender to palpation diffusely, No hepatosplenomegaly. Bowel sounds positive.  Musculoskeletal: no clubbing / cyanosis. Moves all extremities Skin: no jaundice, No lesions.  Neurologic: Sensation intact  Strength equal in all extremities    Labs on Admission: I have personally reviewed following labs and imaging studies  CBC: Recent Labs  Lab 06/18/17 0938  WBC 16.3*  NEUTROABS 12.0*  HGB 12.8  HCT  38.1  MCV 84.9  PLT 406*    Basic Metabolic Panel: Recent Labs  Lab 06/18/17 0939  NA 140  K 3.1*  CL 102  CO2 22  GLUCOSE 208*  BUN 12  CREATININE 0.64  CALCIUM 10.1    GFR: Estimated Creatinine Clearance: 57.4 mL/min (by C-G formula based on SCr of 0.64 mg/dL).  Liver Function Tests: Recent Labs  Lab 06/18/17 0939  AST 29  ALT 32  ALKPHOS 170*  BILITOT 0.7  PROT 8.2*  ALBUMIN 4.2   Recent Labs  Lab 06/18/17 0938  LIPASE 25   No results for input(s): AMMONIA in the last 168 hours.  Coagulation Profile: No results for input(s): INR, PROTIME in the last 168 hours.  Cardiac Enzymes: No results for input(s): CKTOTAL, CKMB, CKMBINDEX, TROPONINI in the last 168 hours.  BNP (last 3 results) No results for input(s): PROBNP in the last 8760 hours.  HbA1C: No results for input(s): HGBA1C in the last 72 hours.  CBG: No results for input(s): GLUCAP in the last 168 hours.  Lipid Profile: No results for input(s): CHOL, HDL, LDLCALC, TRIG, CHOLHDL, LDLDIRECT in the last 72 hours.  Thyroid Function Tests: No results for input(s): TSH, T4TOTAL, FREET4, T3FREE, THYROIDAB in the last 72 hours.  Anemia Panel: No results for input(s): VITAMINB12, FOLATE, FERRITIN, TIBC, IRON, RETICCTPCT in the last 72 hours.  Urine analysis:    Component Value Date/Time   COLORURINE STRAW (A) 06/18/2017 1058   APPEARANCEUR HAZY  (A) 06/18/2017 1058   LABSPEC 1.010 06/18/2017 1058   PHURINE 8.0 06/18/2017 1058   GLUCOSEU 150 (A) 06/18/2017 1058   HGBUR NEGATIVE 06/18/2017 1058   BILIRUBINUR NEGATIVE 06/18/2017 1058   BILIRUBINUR neg 10/25/2014 1506   KETONESUR 20 (A) 06/18/2017 1058   PROTEINUR NEGATIVE 06/18/2017 1058   UROBILINOGEN 0.2 10/25/2014 1506   UROBILINOGEN 0.2 10/14/2014 1247   NITRITE NEGATIVE 06/18/2017 1058   LEUKOCYTESUR NEGATIVE 06/18/2017 1058    Sepsis Labs: @LABRCNTIP (procalcitonin:4,lacticidven:4) )No results found for this or any previous visit (from the past 240 hour(s)).   Radiological Exams on Admission: Dg Chest 2 View  Result Date: 06/18/2017 CLINICAL DATA:  Nausea and vomiting.  Chest discomfort. EXAM: CHEST  2 VIEW COMPARISON:  09/01/2016 FINDINGS: Both lungs are clear. Heart and mediastinum are within normal limits. Surgical plate in the lower cervical spine. No pleural effusions. No acute bone abnormality. IMPRESSION: No active cardiopulmonary disease. Electronically Signed   By: Markus Daft M.D.   On: 06/18/2017 10:32   Ct Abdomen Pelvis W Contrast  Result Date: 06/18/2017 CLINICAL DATA:  Nausea and vomiting since this morning. EXAM: CT ABDOMEN AND PELVIS WITH CONTRAST TECHNIQUE: Multidetector CT imaging of the abdomen and pelvis was performed using the standard protocol following bolus administration of intravenous contrast. CONTRAST:  80 mL ISOVUE-300 IOPAMIDOL (ISOVUE-300) INJECTION 61% COMPARISON:  10/14/2014 and 12/21/2007 FINDINGS: Lower chest: No acute abnormality. Hepatobiliary: Previous cholecystectomy. Liver and biliary tree are unremarkable. Pancreas: Normal. Spleen: Normal. Adrenals/Urinary Tract: Adrenal glands are normal. Kidneys are normal in size with a few small subcentimeter bilateral renal cortical hypodensities too small to characterize but likely cysts. 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 Ureters and  bladder are unremarkable. Stomach/Bowel: Stomach is normal. There are a few proximal jejunal loops with mild wall thickening. Appendix normal. Transverse and descending colon are decompressed. There is minimal diverticulosis of the colon. Vascular/Lymphatic: Minimal calcified plaque over the abdominal aorta and iliac arteries. No adenopathy. Reproductive:  Within normal. Other: Postsurgical change over the anterior abdominal wall. No free fluid. Musculoskeletal: Mild degenerate change of the spine and hips. Grade 1 anterolisthesis of L4 on L5 unchanged. IMPRESSION: Mild nonspecific wall thickening of a few proximal jejunal loops which may be due to a regional enteritis of inflammatory or infectious nature. Diverticulosis of the colon. 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 Aortic Atherosclerosis (ICD10-I70.0). Electronically Signed   By: Marin Olp M.D.   On: 06/18/2017 12:10    EKG: Independently reviewed.  Assessment/Plan Active Problems:   Accelerated hypertension   Depression   TIA (transient ischemic attack)   Cerebral aneurysm without rupture   Hypokalemia   Essential hypertension   Abdominal pain   Hyperlipidemia   Elevated glucose     Intractable nausea and vomiting, abdominal pain   Likely due to infectious enteritis as per CT abdomen and pelvis, and perhaps a component of possible diabetes (not a diagnosed yet).  She denies a history of diabetes, but her blood sugar is 208 with an anion gap of 16,  ketones in urine. lipase is normal at 25.  UA negative for leukocytes.  White count is 16.3, likely reactive.  Alkaline phosphatase 170 she is afebrile.  Vital signs are stable.The patient has been vomiting frequently since presentation.  Zofran, and Phenergan were tried, without significant relief.  She has just received Reglan, with mild alleviation of her symptoms.She has also received IVF and pain ccontrol. Admit to IP med  surg Bowel rest Continue Reglan IV 5 mg every 6 hours as needed. Once improved, can resume Zofran  Prn   IV fluids at 100 cc/h NS  CBC in a.m. Check hemoglobin A1c  sliding scale insulin. Consider GI if the symptoms are not improving. Pain control   Hypertension BP  167/90   Pulse 64     Continue home anti-hypertensive medications  Add Hydralazine Q6 hours as needed for BP >180  Hypokalemia, may be due to volume loss.  Current potassium 3.1 IV  replenishment with 10 mEq x4  check Mg Repeat CMET in am   Elevated Glucose Patient noted to have elevated Glucose in labs at 208. AG 16 in view of volume loss  . No prior documented history of DM. Does not appear to be in DKA  Check A1C SSI   If diagnosis of diabetes is made, will obtain diabetes coordinator. Will need to follow with PCP as outpatient.  Hyperlipidemia Continue home statins in am  Lipid panel   Depression Continue home  Paxil in am     DVT prophylaxis: SCD, due to history of cerebral aneurysm without rupture Code Status:    Full Family Communication:  Discussed with patient Disposition Plan: Expect patient to be discharged to home after condition improves Consults called:    None Admission status: MedSurg inpatient   Sharene Butters, PA-C [facility]   06/18/2017, 4:28 PM

## 2017-06-18 NOTE — ED Provider Notes (Signed)
[facility] EMERGENCY DEPARTMENT Provider Note   CSN: [id number] Arrival date & time: 06/18/17  4098     History   Chief Complaint Chief Complaint  Patient presents with  . Emesis    HPI [name] is a 61 y.o. female past medical history of hyperlipidemia, hypertension, TIA, cerebral aneurysm presenting with sudden onset nausea/vomiting since 3 am this morning. She reports hx of similar symptoms in the past but slightly less severe when she had the flu. She reports generalized abdominal discomfort and chest discomfort with vomiting. Denies headache, dizziness, visual disturbances, numbness, weakness, cough, fever, congestion, myalgias, urinary symptoms.  She denies known ill contacts with similar symptoms or questionable foods yesterday.  HPI  Past Medical History:  Diagnosis Date  . Hyperlipidemia   . Hypertension     Patient Active Problem List   Diagnosis Date Noted  . Abdominal pain 06/18/2017  . Need for hepatitis C screening test 05/23/2017  . Breast pain, left 01/05/2016  . Left ankle sprain 01/20/2015  . Gastritis 11/08/2014  . Malaise 10/14/2014  . Essential hypertension   . Bilateral wrist pain 10/06/2014  . Hot flashes, menopausal 10/06/2014  . Cerebral aneurysm without rupture 08/20/2014  . TIA (transient ischemic attack) 08/19/2014  . Accelerated hypertension 02/24/2013  . Depression (emotion) 02/24/2013    Past Surgical History:  Procedure Laterality Date  . CESAREAN SECTION    . CHOLECYSTECTOMY    . IR GENERIC HISTORICAL  09/21/2014   IR ANGIO INTRA EXTRACRAN SEL INTERNAL CAROTID UNI L MOD SED 09/21/2014 [name], MD [facility]  . IR GENERIC HISTORICAL  09/21/2014   IR ANGIO INTRA EXTRACRAN SEL COM CAROTID INNOMINATE UNI R MOD SED 09/21/2014 [name], MD [facility]  . IR GENERIC HISTORICAL  09/21/2014   IR ANGIO VERTEBRAL SEL VERTEBRAL UNI L MOD SED 09/21/2014 [name], MD [facility]  . IR  GENERIC HISTORICAL  09/21/2014   IR 3D INDEPENDENT WKST 09/21/2014 [name], MD [facility]  . NECK SURGERY      OB History    Gravida Para Term Preterm AB Living   2 1 1   1 1    SAB TAB Ectopic Multiple Live Births   1       1       Home Medications    Prior to Admission medications   Medication Sig Start Date End Date Taking? Authorizing Provider  amLODipine (NORVASC) 10 MG tablet Take 1 tablet (10 mg total) by mouth daily. 04/25/17  Yes Clent Demark, PA-C  aspirin EC 81 MG tablet Take 1 tablet (81 mg total) by mouth daily. 02/01/16  Yes Funches, Josalyn, MD  atorvastatin (LIPITOR) 40 MG tablet Take 1 tablet (40 mg total) daily by mouth. 04/29/17  Yes Clent Demark, PA-C  lisinopril-hydrochlorothiazide (ZESTORETIC) 10-12.5 MG tablet Take 2 tablets by mouth daily. 04/25/17  Yes Clent Demark, PA-C  metoprolol succinate (TOPROL-XL) 25 MG 24 hr tablet Take 2 tablets (50 mg total) by mouth daily. 04/25/17  Yes Clent Demark, PA-C  naproxen (NAPROSYN) 500 MG tablet Take 1 tablet (500 mg total) by mouth 2 (two) times daily with a meal. 04/25/17  Yes Clent Demark, PA-C  PARoxetine (PAXIL) 10 MG tablet Take 1 tablet (10 mg total) by mouth daily. 05/23/17  Yes Clent Demark, PA-C    Family History Family History  Problem Relation Age of Onset  . Hypertension Mother   . Arthritis Mother   .  Diabetes Mother   . Heart disease Father   . Diabetes Sister   . Diabetes Brother   . Diabetes Sister   . Cancer Neg Hx     Social History Social History   Tobacco Use  . Smoking status: Never Smoker  . Smokeless tobacco: Never Used  Substance Use Topics  . Alcohol use: Yes    Comment: ocassionally  . Drug use: No     Allergies   Patient has no known allergies.   Review of Systems Review of Systems  Constitutional: Positive for chills. Negative for activity change, appetite change, diaphoresis and fever.  HENT: Negative for congestion, ear pain  and sore throat.   Eyes: Negative for pain and visual disturbance.  Respiratory: Negative for cough, choking, chest tightness, shortness of breath, wheezing and stridor.   Cardiovascular: Negative for chest pain, palpitations and leg swelling.       She reports a epigastric/substernal discomfort with vomiting.  Gastrointestinal: Positive for nausea and vomiting. Negative for abdominal distention, blood in stool, constipation and diarrhea.       Generalized abdominal discomfort described as "queasy feeling"  Genitourinary: Negative for difficulty urinating, dysuria, flank pain and hematuria.  Musculoskeletal: Negative for arthralgias, back pain, myalgias, neck pain and neck stiffness.  Skin: Negative for color change, pallor and rash.  Neurological: Negative for dizziness, tremors, seizures, syncope, facial asymmetry, speech difficulty, weakness, light-headedness, numbness and headaches.     Physical Exam Updated Vital Signs BP (!) 167/90   Pulse 64   Temp 98.7 F (37.1 C) (Oral)   Resp 19   Ht 5\' 2"  (1.575 m)   Wt 49.9 kg (110 lb)   SpO2 100%   BMI 20.12 kg/m   Physical Exam  Constitutional: She appears well-developed and well-nourished. No distress.  Afebrile, uncomfortable appearing, actively vomiting  HENT:  Head: Normocephalic and atraumatic.  Neck: Normal range of motion. Neck supple.  Cardiovascular: Normal rate, regular rhythm, normal heart sounds and intact distal pulses.  No murmur heard. Pulmonary/Chest: Effort normal and breath sounds normal. No stridor. No respiratory distress. She has no wheezes. She has no rales.  Abdominal: Soft. Bowel sounds are normal. She exhibits no distension and no mass. There is tenderness. There is no rebound and no guarding.  Musculoskeletal: Normal range of motion. She exhibits no edema or tenderness.  Neurological: She is alert.  Skin: Skin is warm and dry. No rash noted. She is not diaphoretic. No erythema. No pallor.  Psychiatric:  She has a normal mood and affect.  Nursing note and vitals reviewed.    ED Treatments / Results  Labs (all labs ordered are listed, but only abnormal results are displayed) Labs Reviewed  COMPREHENSIVE METABOLIC PANEL - Abnormal; Notable for the following components:      Result Value   Potassium 3.1 (*)    Glucose, Bld 208 (*)    Total Protein 8.2 (*)    Alkaline Phosphatase 170 (*)    Anion gap 16 (*)    All other components within normal limits  URINALYSIS, ROUTINE W REFLEX MICROSCOPIC - Abnormal; Notable for the following components:   Color, Urine STRAW (*)    APPearance HAZY (*)    Glucose, UA 150 (*)    Ketones, ur 20 (*)    All other components within normal limits  CBC WITH DIFFERENTIAL/PLATELET - Abnormal; Notable for the following components:   WBC 16.3 (*)    Platelets 406 (*)    Neutro Abs 12.0 (*)  All other components within normal limits  LIPASE, BLOOD  I-STAT TROPONIN, ED    EKG  EKG Interpretation None       Radiology Dg Chest 2 View  Result Date: 06/18/2017 CLINICAL DATA:  Nausea and vomiting.  Chest discomfort. EXAM: CHEST  2 VIEW COMPARISON:  09/01/2016 FINDINGS: Both lungs are clear. Heart and mediastinum are within normal limits. Surgical plate in the lower cervical spine. No pleural effusions. No acute bone abnormality. IMPRESSION: No active cardiopulmonary disease. Electronically Signed   By: Markus Daft M.D.   On: 06/18/2017 10:32   Ct Abdomen Pelvis W Contrast  Result Date: 06/18/2017 CLINICAL DATA:  Nausea and vomiting since this morning. EXAM: CT ABDOMEN AND PELVIS WITH CONTRAST TECHNIQUE: Multidetector CT imaging of the abdomen and pelvis was performed using the standard protocol following bolus administration of intravenous contrast. CONTRAST:  80 mL ISOVUE-300 IOPAMIDOL (ISOVUE-300) INJECTION 61% COMPARISON:  10/14/2014 and 12/21/2007 FINDINGS: Lower chest: No acute abnormality. Hepatobiliary: Previous cholecystectomy. Liver and biliary  tree are unremarkable. Pancreas: Normal. Spleen: Normal. Adrenals/Urinary Tract: Adrenal glands are normal. Kidneys are normal in size with a few small subcentimeter bilateral renal cortical hypodensities too small to characterize but likely cysts. 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 Ureters and bladder are unremarkable. Stomach/Bowel: Stomach is normal. There are a few proximal jejunal loops with mild wall thickening. Appendix normal. Transverse and descending colon are decompressed. There is minimal diverticulosis of the colon. Vascular/Lymphatic: Minimal calcified plaque over the abdominal aorta and iliac arteries. No adenopathy. Reproductive: Within normal. Other: Postsurgical change over the anterior abdominal wall. No free fluid. Musculoskeletal: Mild degenerate change of the spine and hips. Grade 1 anterolisthesis of L4 on L5 unchanged. IMPRESSION: Mild nonspecific wall thickening of a few proximal jejunal loops which may be due to a regional enteritis of inflammatory or infectious nature. Diverticulosis of the colon. 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 Aortic Atherosclerosis (ICD10-I70.0). Electronically Signed   By: Marin Olp M.D.   On: 06/18/2017 12:10    Procedures Procedures (including critical care time)  Medications Ordered in ED Medications  aspirin EC tablet 81 mg (not administered)  metoprolol succinate (TOPROL-XL) 24 hr tablet 50 mg (not administered)  amLODipine (NORVASC) tablet 10 mg (not administered)  atorvastatin (LIPITOR) tablet 40 mg (not administered)  PARoxetine (PAXIL) tablet 10 mg (not administered)  hydrALAZINE (APRESOLINE) injection 5-10 mg (not administered)  acetaminophen (TYLENOL) tablet 650 mg (not administered)    Or  acetaminophen (TYLENOL) suppository 650 mg (not administered)  senna-docusate (Senokot-S) tablet 1 tablet (not  administered)  bisacodyl (DULCOLAX) suppository 10 mg (not administered)  ondansetron (ZOFRAN) tablet 4 mg (not administered)    Or  ondansetron (ZOFRAN) injection 4 mg (not administered)  heparin injection 5,000 Units (not administered)  0.9 %  sodium chloride infusion (not administered)  HYDROcodone-acetaminophen (NORCO/VICODIN) 5-325 MG per tablet 1-2 tablet (not administered)  morphine 4 MG/ML injection 1 mg (not administered)  lisinopril (PRINIVIL,ZESTRIL) tablet 20 mg (not administered)    And  hydrochlorothiazide (HYDRODIURIL) tablet 25 mg (not administered)  ondansetron (ZOFRAN) injection 4 mg (4 mg Intravenous Given 06/18/17 0943)  sodium chloride 0.9 % bolus 1,000 mL (0 mLs Intravenous Stopped 06/18/17 1057)  fentaNYL (SUBLIMAZE) injection 50 mcg (50 mcg Intravenous Given 06/18/17 1055)  sodium chloride 0.9 % bolus 500 mL (0 mLs Intravenous Stopped 06/18/17 1309)  iopamidol (ISOVUE-300) 61 % injection (100 mLs  Contrast Given 06/18/17  1146)  promethazine (PHENERGAN) injection 12.5 mg (12.5 mg Intravenous Given 06/18/17 1310)     Initial Impression / Assessment and Plan / ED Course  I have reviewed the triage vital signs and the nursing notes.  Pertinent labs & imaging results that were available during my care of the patient were reviewed by me and considered in my medical decision making (see chart for details).     Patient presenting with sudden onset of nausea vomiting since 3 AM this morning.  She denies any headache, anticoagulant use, trauma, fever, dizziness, visual disturbances. Patient actively vomiting on initial assessment.  Patient given IV fluids antiemetics ordered labs and will reassess to complete exam.  On reassessment, patient was resting comfortably and improved.  I attempted to complete my physical exam and she started feeling poorly and was tender to palpation of the periumbilical region.  Patient did not want to move to be examined.  Ordered analgesia  and CT  Daughter in the room reported that she has been exerting herself to an extreme attempting to care for everyone at home including a child with special needs and with holiday preparations. She has been under a lot or stress lately.  Elevated white count at 16.3 CT with enteritis.  On second reassessment, patient was resting comfortably and daughter reported no more episodes of vomiting, but patient had been sleeping.  Patient awoken and was vomiting again. She was given phenergan with some improvement.   She becomes symptomatic and starts vomiting anytime she is awake.  She will need to come in for intractable vomiting Patient was discussed with Dr. Lita Mains who has seen patient and agrees with assessment and plan.  Spoke to Dr. Lonny Prude from [facility] who accepted patient for admission.  Final Clinical Impressions(s) / ED Diagnoses   Final diagnoses:  Intractable vomiting with nausea, unspecified vomiting type  Gastroenteritis  Hypokalemia    ED Discharge Orders    None       Dossie Der 06/18/17 1602    Julianne Rice, MD 06/20/17 1123

## 2017-06-18 NOTE — ED Notes (Signed)
Will attempt to recollect EKG for PA once pt is able to be still enough to tolerate.

## 2017-06-18 NOTE — ED Triage Notes (Signed)
Pt from home with nausea and vomiting since 3am this morning. Pt has been throwing up no stop per family since

## 2017-06-18 NOTE — ED Notes (Signed)
Patient transported to CT 

## 2017-06-18 NOTE — ED Notes (Signed)
Pt vomiting and c/o pain, PA made aware.

## 2017-06-18 NOTE — ED Notes (Signed)
Attempted report 

## 2017-06-19 ENCOUNTER — Encounter (HOSPITAL_COMMUNITY): Payer: Self-pay | Admitting: General Practice

## 2017-06-19 DIAGNOSIS — I671 Cerebral aneurysm, nonruptured: Secondary | ICD-10-CM

## 2017-06-19 DIAGNOSIS — K529 Noninfective gastroenteritis and colitis, unspecified: Secondary | ICD-10-CM | POA: Diagnosis present

## 2017-06-19 DIAGNOSIS — I10 Essential (primary) hypertension: Secondary | ICD-10-CM

## 2017-06-19 LAB — COMPREHENSIVE METABOLIC PANEL
ALT: 24 U/L (ref 14–54)
AST: 22 U/L (ref 15–41)
Albumin: 3.5 g/dL (ref 3.5–5.0)
Alkaline Phosphatase: 138 U/L — ABNORMAL HIGH (ref 38–126)
Anion gap: 12 (ref 5–15)
BUN: 10 mg/dL (ref 6–20)
CO2: 21 mmol/L — ABNORMAL LOW (ref 22–32)
Calcium: 8.8 mg/dL — ABNORMAL LOW (ref 8.9–10.3)
Chloride: 105 mmol/L (ref 101–111)
Creatinine, Ser: 0.68 mg/dL (ref 0.44–1.00)
GFR calc Af Amer: >60 mL/min (ref >60)
GFR calc non Af Amer: >60 mL/min (ref >60)
Glucose, Bld: 112 mg/dL — ABNORMAL HIGH (ref 65–99)
Potassium: 2.8 mmol/L — ABNORMAL LOW (ref 3.5–5.1)
Sodium: 138 mmol/L (ref 135–145)
Total Bilirubin: 0.8 mg/dL (ref 0.3–1.2)
Total Protein: 7.1 g/dL (ref 6.5–8.1)

## 2017-06-19 LAB — CBC
HCT: 32.5 % — ABNORMAL LOW (ref 36.0–46.0)
Hemoglobin: 11.1 g/dL — ABNORMAL LOW (ref 12.0–15.0)
MCH: 28.5 pg (ref 26.0–34.0)
MCHC: 34.2 g/dL (ref 30.0–36.0)
MCV: 83.3 fL (ref 78.0–100.0)
Platelets: 337 10*3/uL (ref 150–400)
RBC: 3.9 MIL/uL (ref 3.87–5.11)
RDW: 13.2 % (ref 11.5–15.5)
WBC: 14.2 10*3/uL — ABNORMAL HIGH (ref 4.0–10.5)

## 2017-06-19 LAB — MAGNESIUM: Magnesium: 1.8 mg/dL (ref 1.7–2.4)

## 2017-06-19 LAB — GLUCOSE, CAPILLARY
Glucose-Capillary: 92 mg/dL (ref 65–99)
Glucose-Capillary: 129 mg/dL — ABNORMAL HIGH (ref 65–99)
Glucose-Capillary: 105 mg/dL — ABNORMAL HIGH (ref 65–99)
Glucose-Capillary: 109 mg/dL — ABNORMAL HIGH (ref 65–99)

## 2017-06-19 LAB — POTASSIUM: Potassium: 3.4 mmol/L — ABNORMAL LOW (ref 3.5–5.1)

## 2017-06-19 LAB — PROTIME-INR
INR: 0.95
Prothrombin Time: 12.6 seconds (ref 11.4–15.2)

## 2017-06-19 MED ORDER — ENSURE ENLIVE PO LIQD
237.0000 mL | Freq: Two times a day (BID) | ORAL | Status: DC
  Administered 2017-06-20 – 2017-06-21 (×3): 237 mL via ORAL

## 2017-06-19 MED ORDER — METRONIDAZOLE IN NACL 5-0.79 MG/ML-% IV SOLN
500.0000 mg | Freq: Three times a day (TID) | INTRAVENOUS | Status: DC
  Administered 2017-06-19 – 2017-06-22 (×10): 500 mg via INTRAVENOUS
  Filled 2017-06-19 (×12): qty 100

## 2017-06-19 MED ORDER — DEXTROSE 5 % IV SOLN
1.0000 g | INTRAVENOUS | Status: DC
  Administered 2017-06-19 – 2017-06-22 (×4): 1 g via INTRAVENOUS
  Filled 2017-06-19 (×4): qty 10

## 2017-06-19 MED ORDER — BOOST / RESOURCE BREEZE PO LIQD CUSTOM
1.0000 | Freq: Three times a day (TID) | ORAL | Status: DC
  Administered 2017-06-19: 1 via ORAL
  Administered 2017-06-20: 237 mL via ORAL
  Administered 2017-06-20: 1 via ORAL
  Administered 2017-06-20: 237 mL via ORAL

## 2017-06-19 MED ORDER — BOOST / RESOURCE BREEZE PO LIQD CUSTOM: 1.0000 | Freq: Three times a day (TID) | ORAL | Status: DC

## 2017-06-19 MED ORDER — POTASSIUM CHLORIDE CRYS ER 20 MEQ PO TBCR
40.0000 meq | EXTENDED_RELEASE_TABLET | ORAL | Status: AC
  Administered 2017-06-19 (×2): 40 meq via ORAL
  Filled 2017-06-19 (×2): qty 2

## 2017-06-19 MED ORDER — POTASSIUM CHLORIDE 10 MEQ/100ML IV SOLN
INTRAVENOUS | Status: AC
  Administered 2017-06-19: 10 meq
  Filled 2017-06-19: qty 100

## 2017-06-19 MED ORDER — POTASSIUM CHLORIDE 10 MEQ/100ML IV SOLN
10.0000 meq | INTRAVENOUS | Status: AC
  Administered 2017-06-19 (×2): 10 meq via INTRAVENOUS
  Filled 2017-06-19 (×2): qty 100

## 2017-06-19 NOTE — Progress Notes (Signed)
[facility]                                                                              Patient Demographics  [name], is a 61 y.o. female, DOB - 10/26/1955, ALP:[id number]  Admit date - 06/18/2017   Admitting Physician Desiree Hane, MD  Outpatient Primary MD for the patient is Clent Demark, PA-C  Outpatient specialists:   LOS - 1  days   Medical records reviewed and are as summarized below:    Chief Complaint  Patient presents with  . Emesis       Brief summary   Patient is a 61 year old female with history of hypertension, hyperlipidemia prior history of TIA presented with sudden onset of nausea, vomiting on the day of admission.  Patient reported feeling generalized weakness, generalized abdominal discomfort with vomiting, diarrhea.  She denied any sick contacts but admitted to recent food intake at the restaurants.  CT abdomen pelvis showed enteritis  Assessment & Plan    Principal Problem: Acute enteritis with abdominal pain, nausea vomiting and diarrhea -CT abdomen and pelvis showed mild nonspecific wall thickening of a few proximal jejunal loops may be due to regional enteritis of inflammatory or infectious nature.   - Leukocytosis 16.3 at the time of admission, improved. -Vomiting improved however still had diarrhea yesterday, obtain GI pathogen panel -Continue IV fluid hydration, start clear liquid diet -Continue IV Rocephin and Flagyl  Active Problems:  Hypokalemia -Potassium 2.8 possibly due to diarrhea -Replace aggressively, recheck potassium level later today, keep potassium ~4, magnesium ~2 -Magnesium 2.0  Prolonged QTC -EKG 12/25 showed QTC of 664.  EKG in ED, showed QTC of 437 -Discontinued scheduled Reglan, obtain repeat EKG. if still prolonged QTC >500, DC Zofran as well.    Accelerated hypertension -BP 167/90 at the time of admission, continue amlodipine, metoprolol, BP stable today  Hyperglycemia -Patient  was noted to have glucose level 208 at the time of admission, no prior history of diabetes -Obtain hemoglobin A1c  Hyperlipidemia, history of TIA Continue aspirin, statin  Depression -Continue Paxil  Code Status: Full code start DVT Prophylaxis:  SCD's Family Communication: Discussed in detail with the patient, all imaging results, lab results explained to the patient   Disposition Plan:   Time Spent in minutes 35 minutes  Procedures:    Consultants:   None  Antimicrobials:   IV Rocephin 12/26  IV Flagyl 12/26   Medications  Scheduled Meds: . amLODipine  10 mg Oral Daily  . aspirin EC  81 mg Oral Daily  . atorvastatin  40 mg Oral Daily  . insulin aspart  0-9 Units Subcutaneous TID WC  . metoprolol succinate  50 mg Oral Daily  . PARoxetine  10 mg Oral Daily   Continuous Infusions: . sodium chloride 100 mL/hr at 06/19/17 0259  . cefTRIAXone (ROCEPHIN)  IV Stopped (06/19/17 1028)  . metronidazole Stopped (06/19/17 1029)   PRN Meds:.acetaminophen **OR** acetaminophen, bisacodyl, hydrALAZINE, HYDROcodone-acetaminophen, morphine injection, ondansetron, senna-docusate   Antibiotics   Anti-infectives (From admission, onward)   Start     Dose/Rate Route Frequency Ordered Stop   06/19/17 0800  metroNIDAZOLE (FLAGYL) IVPB 500 mg     500 mg 100 mL/hr over 60 Minutes Intravenous Every 8 hours 06/19/17 0644     06/19/17 0730  cefTRIAXone (ROCEPHIN) 1 g in dextrose 5 % 50 mL IVPB     1 g 100 mL/hr over 30 Minutes Intravenous Every 24 hours 06/19/17 9678          Subjective:   [name] was seen and examined today.  Vomiting improved, diarrhea overnight, 1 BM this morning.  Abdominal pain is improving.  No fevers or chills. Patient denies dizziness, chest pain, shortness of breath, new weakness, numbess, tingling. No acute events overnight.    Objective:   Vitals:   06/18/17 1704 06/18/17 2055 06/19/17 0500 06/19/17 0545  BP: (!) 164/89 140/74  138/71    Pulse:  70  80  Resp:  18  18  Temp:  97.9 F (36.6 C)  98.7 F (37.1 C)  TempSrc:  Oral  Oral  SpO2:  100%  100%  Weight:   50.5 kg (111 lb 5.3 oz)   Height:        Intake/Output Summary (Last 24 hours) at 06/19/2017 1203 Last data filed at 06/19/2017 1029 Gross per 24 hour  Intake 2150 ml  Output 600 ml  Net 1550 ml     Wt Readings from Last 3 Encounters:  06/19/17 50.5 kg (111 lb 5.3 oz)  05/23/17 48.4 kg (106 lb 12.8 oz)  04/25/17 49.4 kg (108 lb 12.8 oz)     Exam  General: Alert and oriented x 3, NAD  Eyes:   HEENT:  Atraumatic, normocephalic  Cardiovascular: S1 S2 auscultated, no rubs, murmurs or gallops. Regular rate and rhythm.  Respiratory: Clear to auscultation bilaterally, no wheezing, rales or rhonchi  Gastrointestinal: Soft, mild diffuse tenderness, nondistended, + bowel sounds  Ext: no pedal edema bilaterally  Neuro: AAOx3, Cr N's II- XII. Strength 5/5 upper and lower extremities bilaterally  Musculoskeletal: No digital cyanosis, clubbing  Skin: No rashes  Psych: Normal affect and demeanor, alert and oriented x3    Data Reviewed:  I have personally reviewed following labs and imaging studies  Micro Results No results found for this or any previous visit (from the past 240 hour(s)).  Radiology Reports Dg Chest 2 View  Result Date: 06/18/2017 CLINICAL DATA:  Nausea and vomiting.  Chest discomfort. EXAM: CHEST  2 VIEW COMPARISON:  09/01/2016 FINDINGS: Both lungs are clear. Heart and mediastinum are within normal limits. Surgical plate in the lower cervical spine. No pleural effusions. No acute bone abnormality. IMPRESSION: No active cardiopulmonary disease. Electronically Signed   By: Markus Daft M.D.   On: 06/18/2017 10:32   Ct Abdomen Pelvis W Contrast  Result Date: 06/18/2017 CLINICAL DATA:  Nausea and vomiting since this morning. EXAM: CT ABDOMEN AND PELVIS WITH CONTRAST TECHNIQUE: Multidetector CT imaging of the abdomen and pelvis was  performed using the standard protocol following bolus administration of intravenous contrast. CONTRAST:  80 mL ISOVUE-300 IOPAMIDOL (ISOVUE-300) INJECTION 61% COMPARISON:  10/14/2014 and 12/21/2007 FINDINGS: Lower chest: No acute abnormality. Hepatobiliary: Previous cholecystectomy. Liver and biliary tree are unremarkable. Pancreas: Normal. Spleen: Normal. Adrenals/Urinary Tract: Adrenal glands are normal. Kidneys are normal in size with a few small subcentimeter bilateral renal cortical hypodensities too small to characterize but likely cysts. 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 Ureters and bladder are unremarkable. Stomach/Bowel: Stomach is normal. There are a few proximal jejunal loops with mild wall thickening.  Appendix normal. Transverse and descending colon are decompressed. There is minimal diverticulosis of the colon. Vascular/Lymphatic: Minimal calcified plaque over the abdominal aorta and iliac arteries. No adenopathy. Reproductive: Within normal. Other: Postsurgical change over the anterior abdominal wall. No free fluid. Musculoskeletal: Mild degenerate change of the spine and hips. Grade 1 anterolisthesis of L4 on L5 unchanged. IMPRESSION: Mild nonspecific wall thickening of a few proximal jejunal loops which may be due to a regional enteritis of inflammatory or infectious nature. Diverticulosis of the colon. 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 Aortic Atherosclerosis (ICD10-I70.0). Electronically Signed   By: Marin Olp M.D.   On: 06/18/2017 12:10    Lab Data:  CBC: Recent Labs  Lab 06/18/17 0938 06/19/17 0449  WBC 16.3* 14.2*  NEUTROABS 12.0*  --   HGB 12.8 11.1*  HCT 38.1 32.5*  MCV 84.9 83.3  PLT 406* 664   Basic Metabolic Panel: Recent Labs  Lab 06/18/17 0939 06/18/17 1633 06/19/17 0449  NA 140  --  138  K 3.1*  --  2.8*  CL 102  --  105  CO2 22  --   21*  GLUCOSE 208*  --  112*  BUN 12  --  10  CREATININE 0.64  --  0.68  CALCIUM 10.1  --  8.8*  MG  --  2.0  --    GFR: Estimated Creatinine Clearance: 57.7 mL/min (by C-G formula based on SCr of 0.68 mg/dL). Liver Function Tests: Recent Labs  Lab 06/18/17 0939 06/19/17 0449  AST 29 22  ALT 32 24  ALKPHOS 170* 138*  BILITOT 0.7 0.8  PROT 8.2* 7.1  ALBUMIN 4.2 3.5   Recent Labs  Lab 06/18/17 0938  LIPASE 25   No results for input(s): AMMONIA in the last 168 hours. Coagulation Profile: Recent Labs  Lab 06/19/17 0449  INR 0.95   Cardiac Enzymes: No results for input(s): CKTOTAL, CKMB, CKMBINDEX, TROPONINI in the last 168 hours. BNP (last 3 results) No results for input(s): PROBNP in the last 8760 hours. HbA1C: No results for input(s): HGBA1C in the last 72 hours. CBG: Recent Labs  Lab 06/18/17 1649 06/18/17 2054 06/19/17 0738  GLUCAP 114* 81 92   Lipid Profile: Recent Labs    06/18/17 1633  CHOL 186  HDL 70  LDLCALC 104*  TRIG 62  CHOLHDL 2.7   Thyroid Function Tests: No results for input(s): TSH, T4TOTAL, FREET4, T3FREE, THYROIDAB in the last 72 hours. Anemia Panel: No results for input(s): VITAMINB12, FOLATE, FERRITIN, TIBC, IRON, RETICCTPCT in the last 72 hours. Urine analysis:    Component Value Date/Time   COLORURINE STRAW (A) 06/18/2017 1058   APPEARANCEUR HAZY (A) 06/18/2017 1058   LABSPEC 1.010 06/18/2017 1058   PHURINE 8.0 06/18/2017 1058   GLUCOSEU 150 (A) 06/18/2017 1058   HGBUR NEGATIVE 06/18/2017 1058   BILIRUBINUR NEGATIVE 06/18/2017 1058   BILIRUBINUR neg 10/25/2014 1506   KETONESUR 20 (A) 06/18/2017 1058   PROTEINUR NEGATIVE 06/18/2017 1058   UROBILINOGEN 0.2 10/25/2014 1506   UROBILINOGEN 0.2 10/14/2014 1247   NITRITE NEGATIVE 06/18/2017 1058   LEUKOCYTESUR NEGATIVE 06/18/2017 1058     Maigen Mozingo M.D. [facility] 06/19/2017, 12:03 PM  Pager: [phone] Between 7am to 7pm - call Pager - [phone]  After 7pm  go to [email or web address] - password [email or web address]  Call night coverage person covering after 7pm

## 2017-06-20 DIAGNOSIS — E876 Hypokalemia: Secondary | ICD-10-CM

## 2017-06-20 DIAGNOSIS — F329 Major depressive disorder, single episode, unspecified: Secondary | ICD-10-CM

## 2017-06-20 DIAGNOSIS — E785 Hyperlipidemia, unspecified: Secondary | ICD-10-CM

## 2017-06-20 DIAGNOSIS — G459 Transient cerebral ischemic attack, unspecified: Secondary | ICD-10-CM

## 2017-06-20 LAB — MAGNESIUM: Magnesium: 1.9 mg/dL (ref 1.7–2.4)

## 2017-06-20 LAB — GASTROINTESTINAL PANEL BY PCR, STOOL (REPLACES STOOL CULTURE)

## 2017-06-20 LAB — BASIC METABOLIC PANEL
Anion gap: 14 (ref 5–15)
BUN: 6 mg/dL (ref 6–20)
CO2: 22 mmol/L (ref 22–32)
Calcium: 9.2 mg/dL (ref 8.9–10.3)
Chloride: 103 mmol/L (ref 101–111)
Creatinine, Ser: 0.6 mg/dL (ref 0.44–1.00)
GFR calc Af Amer: >60 mL/min (ref >60)
GFR calc non Af Amer: >60 mL/min (ref >60)
Glucose, Bld: 119 mg/dL — ABNORMAL HIGH (ref 65–99)
Potassium: 3.3 mmol/L — ABNORMAL LOW (ref 3.5–5.1)
Sodium: 139 mmol/L (ref 135–145)

## 2017-06-20 LAB — CBC
HCT: 36.3 % (ref 36.0–46.0)
Hemoglobin: 12.1 g/dL (ref 12.0–15.0)
MCH: 28.5 pg (ref 26.0–34.0)
MCHC: 33.3 g/dL (ref 30.0–36.0)
MCV: 85.4 fL (ref 78.0–100.0)
Platelets: 369 10*3/uL (ref 150–400)
RBC: 4.25 MIL/uL (ref 3.87–5.11)
RDW: 13.5 % (ref 11.5–15.5)
WBC: 14.3 10*3/uL — ABNORMAL HIGH (ref 4.0–10.5)

## 2017-06-20 LAB — GLUCOSE, CAPILLARY
Glucose-Capillary: 107 mg/dL — ABNORMAL HIGH (ref 65–99)
Glucose-Capillary: 104 mg/dL — ABNORMAL HIGH (ref 65–99)
Glucose-Capillary: 109 mg/dL — ABNORMAL HIGH (ref 65–99)
Glucose-Capillary: 131 mg/dL — ABNORMAL HIGH (ref 65–99)

## 2017-06-20 MED ORDER — WHITE PETROLATUM EX OINT
TOPICAL_OINTMENT | CUTANEOUS | Status: AC
  Administered 2017-06-20: 19:00:00
  Filled 2017-06-20: qty 28.35

## 2017-06-20 MED ORDER — POTASSIUM CHLORIDE CRYS ER 20 MEQ PO TBCR
40.0000 meq | EXTENDED_RELEASE_TABLET | ORAL | Status: AC
  Administered 2017-06-20 (×2): 40 meq via ORAL
  Filled 2017-06-20 (×2): qty 2

## 2017-06-20 NOTE — Progress Notes (Signed)
PROGRESS NOTE    [name]  QZE:[id number] DOB: Nov 15, 1955 DOA: 06/18/2017 PCP: Clent Demark, PA-C  Brief Narrative:  The patient  is a 61 year old female with history of hypertension, hyperlipidemia, prior history of TIA, and other comorbids who presented with sudden onset of nausea, vomiting on the day of admission.  Patient reported feeling generalized weakness, generalized abdominal discomfort with vomiting, diarrhea.  She denied any sick contacts but admitted to recent food intake at the restaurants.  CT abdomen pelvis showed enteritis. Slowly improving   Assessment & Plan:   Principal Problem:   Enteritis Active Problems:   Accelerated hypertension   Depression   TIA (transient ischemic attack)   Cerebral aneurysm without rupture   Hypokalemia   Essential hypertension   Abdominal pain   Hyperlipidemia   Elevated glucose  Acute Enteritis with Abdominal Pain/Nausea/Vomiting and Diarrhea -CT abdomen and pelvis showed mild nonspecific wall thickening of a few proximal jejunal loops may be due to regional enteritis of inflammatory or infectious nature.   -Leukocytosis 16.3 at the time of admission, improving and now 14.3. -Vomiting and Diarrhea Both improved  -Obtained GI Pathogen Panel and was Negative -Continue IV Fluid Hydration,  -Clear Liquid Diet advanced to Full Liquid Diet  -Continue IV Rocephin and Flagyl -Pain Control with Hydrocodone -PT Eval and Treat  Hypokalemia -Potassium 2.8 on Admission likley due to diarrhea; This AM was 3.3 -Replete today with po KCl 40 mEQ BID x 2 Doses  -Continue to Monitor and Replete as Necessary,  -Keep potassium ~4, magnesium ~2 -Magnesium today was 1.9 -Repeat CMP and Mag Level in AM   Prolonged QTC -EKG 12/25 showed QTC of 664. EKG in ED, showed QTC of 437 -Discontinued scheduled Reglan,  -Obtained repeat EKG and qTC was 441.  -If still prolonged consistently with QTC >500, DC Zofran as  well.  Accelerated Hypertension -BP 167/90 at the time of admission; Now 157/97 -Continue Amlodipine 10 mg po Daily and Metoprolol 50 mg po Daily -C/w Hydralazine 5-10 mg IV q8hprn HBP SBP>180  Hyperglycemia -Patient was noted to have glucose level 208 at the time of admission; Possibly Reactive  -No prior history of Diabetes -Obtain Hemoglobin A1c  -CBG's ranging from 104-109  Hyperlipidemia,  -Lipid Panel showed Cholesterol of 186, HDL of 70, LDL of 104, TG of 62, VLDL of 12 -C/w Atorvastatin 40 mg po Daily  History of TIA -C/w ASA 81 mg po Daily and Atorvastatin 40 mg po Daily  Depression -Continue Paroxetine 10 mg po Daily   Thrombocytosis -Improved -Likely Reactive -Continue to Monitor and Repeat CBC in AM   DVT prophylaxis: SCDs Code Status: FULL CODE Family Communication: No Family present at bedside Disposition Plan: Anticipate D/C within 24-48 Hours  Consultants:   None   Procedures: None   Antimicrobials:  Anti-infectives (From admission, onward)   Start     Dose/Rate Route Frequency Ordered Stop   06/19/17 0800  metroNIDAZOLE (FLAGYL) IVPB 500 mg     500 mg 100 mL/hr over 60 Minutes Intravenous Every 8 hours 06/19/17 0644     06/19/17 0730  cefTRIAXone (ROCEPHIN) 1 g in dextrose 5 % 50 mL IVPB     1 g 100 mL/hr over 30 Minutes Intravenous Every 24 hours 06/19/17 0644       Subjective: Seen and examined at beside and felt slightly better. No CP or SOB. States diarrhea has improved. No other concerns or complaints at this time.   Objective: Vitals:   06/20/17  0500 06/20/17 0604 06/20/17 1021 06/20/17 1450  BP:  (!) 153/92 129/79 (!) 157/97  Pulse:  60  76  Resp:  19  16  Temp:  98.8 F (37.1 C)  98.2 F (36.8 C)  TempSrc:    Oral  SpO2:  99%  100%  Weight: 49.6 kg (109 lb 5.6 oz)     Height:        Intake/Output Summary (Last 24 hours) at 06/20/2017 1818 Last data filed at 06/20/2017 1747 Gross per 24 hour  Intake 1330 ml  Output  -  Net 1330 ml   Filed Weights   06/18/17 1645 06/19/17 0500 06/20/17 0500  Weight: 49 kg (108 lb 0.4 oz) 50.5 kg (111 lb 5.3 oz) 49.6 kg (109 lb 5.6 oz)   Examination: Physical Exam:  Constitutional: Thin AAF in NAD and appears calm and comfortable Eyes: Lids and conjunctivae normal, sclerae anicteric  ENMT: External Ears, Nose appear normal. Grossly normal hearing. Mucous membranes are moist.  Neck: Appears normal, supple, no cervical masses, normal ROM, no appreciable thyromegaly, no JVD Respiratory: Clear to auscultation bilaterally, no wheezing, rales, rhonchi or crackles. Normal respiratory effort and patient is not tachypenic. No accessory muscle use.  Cardiovascular: RRR, no murmurs / rubs / gallops. S1 and S2 auscultated. No extremity edema. Abdomen: Soft, mildly tender, non-distended. No masses palpated. No appreciable hepatosplenomegaly. Bowel sounds positive x4.  GU: Deferred. Musculoskeletal: No clubbing / cyanosis of digits/nails. No joint deformity upper and lower extremities.  Skin: No rashes, lesions, ulcers on a limited skin eval. No induration; Warm and dry.  Neurologic: CN 2-12 grossly intact with no focal deficits. Sensation intact in all 4 Extremities. Romberg sign and cerebellar reflexes not assessed.  Psychiatric: Normal judgment and insight. Alert and oriented x 3. Normal mood and appropriate affect.   Data Reviewed: I have personally reviewed following labs and imaging studies  CBC: Recent Labs  Lab 06/18/17 0938 06/19/17 0449 06/20/17 0729  WBC 16.3* 14.2* 14.3*  NEUTROABS 12.0*  --   --   HGB 12.8 11.1* 12.1  HCT 38.1 32.5* 36.3  MCV 84.9 83.3 85.4  PLT 406* 337 308   Basic Metabolic Panel: Recent Labs  Lab 06/18/17 0939 06/18/17 1633 06/19/17 0449 06/19/17 1353 06/20/17 0729 06/20/17 1100  NA 140  --  138  --  139  --   K 3.1*  --  2.8* 3.4* 3.3*  --   CL 102  --  105  --  103  --   CO2 22  --  21*  --  22  --   GLUCOSE 208*  --  112*   --  119*  --   BUN 12  --  10  --  6  --   CREATININE 0.64  --  0.68  --  0.60  --   CALCIUM 10.1  --  8.8*  --  9.2  --   MG  --  2.0 1.8  --   --  1.9   GFR: Estimated Creatinine Clearance: 57.1 mL/min (by C-G formula based on SCr of 0.6 mg/dL). Liver Function Tests: Recent Labs  Lab 06/18/17 0939 06/19/17 0449  AST 29 22  ALT 32 24  ALKPHOS 170* 138*  BILITOT 0.7 0.8  PROT 8.2* 7.1  ALBUMIN 4.2 3.5   Recent Labs  Lab 06/18/17 0938  LIPASE 25   No results for input(s): AMMONIA in the last 168 hours. Coagulation Profile: Recent Labs  Lab 06/19/17 0449  INR 0.95   Cardiac Enzymes: No results for input(s): CKTOTAL, CKMB, CKMBINDEX, TROPONINI in the last 168 hours. BNP (last 3 results) No results for input(s): PROBNP in the last 8760 hours. HbA1C: No results for input(s): HGBA1C in the last 72 hours. CBG: Recent Labs  Lab 06/19/17 1700 06/19/17 2107 06/20/17 0807 06/20/17 1218 06/20/17 1724  GLUCAP 105* 109* 107* 104* 109*   Lipid Profile: Recent Labs    06/18/17 1633  CHOL 186  HDL 70  LDLCALC 104*  TRIG 62  CHOLHDL 2.7   Thyroid Function Tests: No results for input(s): TSH, T4TOTAL, FREET4, T3FREE, THYROIDAB in the last 72 hours. Anemia Panel: No results for input(s): VITAMINB12, FOLATE, FERRITIN, TIBC, IRON, RETICCTPCT in the last 72 hours. Sepsis Labs: No results for input(s): PROCALCITON, LATICACIDVEN in the last 168 hours.  Recent Results (from the past 240 hour(s))  Gastrointestinal Panel by PCR , Stool     Status: None   Collection Time: 06/19/17  9:18 AM  Result Value Ref Range Status   Campylobacter species NOT DETECTED NOT DETECTED Final   Plesimonas shigelloides NOT DETECTED NOT DETECTED Final   Salmonella species NOT DETECTED NOT DETECTED Final   Yersinia enterocolitica NOT DETECTED NOT DETECTED Final   Vibrio species NOT DETECTED NOT DETECTED Final   Vibrio cholerae NOT DETECTED NOT DETECTED Final   Enteroaggregative E coli (EAEC)  NOT DETECTED NOT DETECTED Final   Enteropathogenic E coli (EPEC) NOT DETECTED NOT DETECTED Final   Enterotoxigenic E coli (ETEC) NOT DETECTED NOT DETECTED Final   Shiga like toxin producing E coli (STEC) NOT DETECTED NOT DETECTED Final   Shigella/Enteroinvasive E coli (EIEC) NOT DETECTED NOT DETECTED Final   Cryptosporidium NOT DETECTED NOT DETECTED Final   Cyclospora cayetanensis NOT DETECTED NOT DETECTED Final   Entamoeba histolytica NOT DETECTED NOT DETECTED Final   Giardia lamblia NOT DETECTED NOT DETECTED Final   Adenovirus F40/41 NOT DETECTED NOT DETECTED Final   Astrovirus NOT DETECTED NOT DETECTED Final   Norovirus GI/GII NOT DETECTED NOT DETECTED Final   Rotavirus A NOT DETECTED NOT DETECTED Final   Sapovirus (I, II, IV, and V) NOT DETECTED NOT DETECTED Final    Comment: Performed at [facility], [address] [place], [place] [address]    Radiology Studies: No results found.  Scheduled Meds: . amLODipine  10 mg Oral Daily  . aspirin EC  81 mg Oral Daily  . atorvastatin  40 mg Oral Daily  . feeding supplement  1 Container Oral TID BM  . feeding supplement (ENSURE ENLIVE)  237 mL Oral BID BM  . insulin aspart  0-9 Units Subcutaneous TID WC  . metoprolol succinate  50 mg Oral Daily  . PARoxetine  10 mg Oral Daily   Continuous Infusions: . cefTRIAXone (ROCEPHIN)  IV Stopped (06/20/17 0845)  . metronidazole Stopped (06/20/17 1626)    LOS: 2 days   Kerney Elbe, DO [facility] Pager [phone]  If 7PM-7AM, please contact night-coverage [email or web address] Password [email or web address] 06/20/2017, 6:18 PM

## 2017-06-21 LAB — COMPREHENSIVE METABOLIC PANEL
ALT: 19 U/L (ref 14–54)
AST: 19 U/L (ref 15–41)
Albumin: 3.3 g/dL — ABNORMAL LOW (ref 3.5–5.0)
Alkaline Phosphatase: 124 U/L (ref 38–126)
Anion gap: 7 (ref 5–15)
BUN: 8 mg/dL (ref 6–20)
CO2: 25 mmol/L (ref 22–32)
Calcium: 9.3 mg/dL (ref 8.9–10.3)
Chloride: 106 mmol/L (ref 101–111)
Creatinine, Ser: 0.65 mg/dL (ref 0.44–1.00)
GFR calc Af Amer: >60 mL/min (ref >60)
GFR calc non Af Amer: >60 mL/min (ref >60)
Glucose, Bld: 110 mg/dL — ABNORMAL HIGH (ref 65–99)
Potassium: 3.7 mmol/L (ref 3.5–5.1)
Sodium: 138 mmol/L (ref 135–145)
Total Bilirubin: 0.9 mg/dL (ref 0.3–1.2)
Total Protein: 6.6 g/dL (ref 6.5–8.1)

## 2017-06-21 LAB — GLUCOSE, CAPILLARY
Glucose-Capillary: 97 mg/dL (ref 65–99)
Glucose-Capillary: 96 mg/dL (ref 65–99)
Glucose-Capillary: 105 mg/dL — ABNORMAL HIGH (ref 65–99)
Glucose-Capillary: 116 mg/dL — ABNORMAL HIGH (ref 65–99)

## 2017-06-21 LAB — CBC WITH DIFFERENTIAL/PLATELET
Basophils Absolute: 0 10*3/uL (ref 0.0–0.1)
Basophils Relative: 0 %
Eosinophils Absolute: 0.1 10*3/uL (ref 0.0–0.7)
Eosinophils Relative: 1 %
HCT: 37.3 % (ref 36.0–46.0)
Hemoglobin: 12.3 g/dL (ref 12.0–15.0)
Lymphocytes Relative: 26 %
Lymphs Abs: 3.1 10*3/uL (ref 0.7–4.0)
MCH: 28.1 pg (ref 26.0–34.0)
MCHC: 33 g/dL (ref 30.0–36.0)
MCV: 85.4 fL (ref 78.0–100.0)
Monocytes Absolute: 0.5 10*3/uL (ref 0.1–1.0)
Monocytes Relative: 4 %
Neutro Abs: 8.3 10*3/uL — ABNORMAL HIGH (ref 1.7–7.7)
Neutrophils Relative %: 69 %
Platelets: 357 10*3/uL (ref 150–400)
RBC: 4.37 MIL/uL (ref 3.87–5.11)
RDW: 13.6 % (ref 11.5–15.5)
WBC: 12 10*3/uL — ABNORMAL HIGH (ref 4.0–10.5)

## 2017-06-21 LAB — MAGNESIUM: Magnesium: 1.8 mg/dL (ref 1.7–2.4)

## 2017-06-21 LAB — HEMOGLOBIN A1C
Hgb A1c MFr Bld: 6.4 % — ABNORMAL HIGH (ref 4.8–5.6)
Mean Plasma Glucose: 136.98 mg/dL

## 2017-06-21 MED ORDER — MAGNESIUM SULFATE 2 GM/50ML IV SOLN
2.0000 g | Freq: Once | INTRAVENOUS | Status: AC
  Administered 2017-06-21: 2 g via INTRAVENOUS
  Filled 2017-06-21: qty 50

## 2017-06-21 MED ORDER — POTASSIUM CHLORIDE CRYS ER 20 MEQ PO TBCR
40.0000 meq | EXTENDED_RELEASE_TABLET | Freq: Once | ORAL | Status: AC
  Administered 2017-06-21: 40 meq via ORAL
  Filled 2017-06-21: qty 2

## 2017-06-21 MED ORDER — ENSURE ENLIVE PO LIQD: 237.0000 mL | Freq: Three times a day (TID) | ORAL | Status: DC

## 2017-06-21 NOTE — Plan of Care (Signed)
  Health Behavior/Discharge Planning: Ability to manage health-related needs will improve 06/21/2017 0229 - Progressing by Irish Lack, RN   Clinical Measurements: Ability to maintain clinical measurements within normal limits will improve 06/21/2017 0229 - Progressing by Irish Lack, RN   Nutrition: Adequate nutrition will be maintained 06/21/2017 0229 - Progressing by Irish Lack, RN

## 2017-06-21 NOTE — Evaluation (Signed)
Physical Therapy Evaluation Patient Details Name: [name] MRN: [id number] DOB: 04/20/1956 Today's Date: 06/21/2017   History of Present Illness  Pt is a 61 y/o female with a PMH significant for HTN, TIA, anemia. Pt presents with sudden onset of N/V/D. CT revealed acute enteritis.  Clinical Impression  Pt admitted with above diagnosis. Pt currently with functional limitations due to the deficits listed below (see PT Problem List). At the time of PT eval pt was able to perform transfers with gross min guard assist, and ambulation with gross min assist for balance support and safety. Overall, tolerance for functional activity is low and pt requires assistance for OOB mobility due to unsteadiness. Pt will benefit from skilled PT to increase their independence and safety with mobility to allow discharge to the venue listed below.       Follow Up Recommendations No PT follow up;Supervision for mobility/OOB    Equipment Recommendations  None recommended by PT    Recommendations for Other Services       Precautions / Restrictions Precautions Precautions: Fall Restrictions Weight Bearing Restrictions: No      Mobility  Bed Mobility Overal bed mobility: Needs Assistance Bed Mobility: Sit to Supine       Sit to supine: Supervision   General bed mobility comments: Supervision for safety as pt transitioned back to supine. Pt was unable to cover herself back up with linen and required therapist assist.   Transfers Overall transfer level: Needs assistance Equipment used: 1 person hand held assist Transfers: Sit to/from Stand Sit to Stand: Min guard         General transfer comment: Pt demonstrated proper hand placement on seated surface for safety. HHA for safety.   Ambulation/Gait Ambulation/Gait assistance: Min assist Ambulation Distance (Feet): 75 Feet Assistive device: 1 person hand held assist Gait Pattern/deviations: Step-through pattern;Decreased stride  length;Trunk flexed Gait velocity: Decreased Gait velocity interpretation: Below normal speed for age/gender General Gait Details: Pt occasionally unsteady with assist required to recover. 2 standing rest breaks taken throughout gait training due to fatigue and SOB.   Stairs            Wheelchair Mobility    Modified Rankin (Stroke Patients Only)       Balance Overall balance assessment: Needs assistance Sitting-balance support: Feet supported;No upper extremity supported Sitting balance-Leahy Scale: Fair     Standing balance support: No upper extremity supported;During functional activity Standing balance-Leahy Scale: Poor Standing balance comment: Required assist to maintain dynamic standing balance.                              Pertinent Vitals/Pain Pain Assessment: Faces Faces Pain Scale: Hurts a little bit Pain Location: General grimacing with movement Pain Descriptors / Indicators: Discomfort;Grimacing Pain Intervention(s): Limited activity within patient's tolerance;Monitored during session;Repositioned    Home Living Family/patient expects to be discharged to:: Private residence Living Arrangements: Parent;Children Available Help at Discharge: Family;Available 24 hours/day Type of Home: House Home Access: Level entry     Home Layout: One level Home Equipment: Shower seat;Grab bars - toilet;Grab bars - tub/shower;Cane - single point      Prior Function Level of Independence: Independent         Comments: Retired; Is driving; 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     Hand Dominance        Extremity/Trunk Assessment   Upper Extremity Assessment Upper Extremity  Assessment: Overall WFL for tasks assessed    Lower Extremity Assessment Lower Extremity Assessment: Overall WFL for tasks assessed(Strength is good but muscular endurance is poor)    Cervical / Trunk Assessment Cervical / Trunk  Assessment: Normal  Communication   Communication: No difficulties  Cognition Arousal/Alertness: Awake/alert Behavior During Therapy: WFL for tasks assessed/performed Overall Cognitive Status: Within Functional Limits for tasks assessed                                        General Comments      Exercises     Assessment/Plan    PT Assessment Patient needs continued PT services  PT Problem List Decreased strength;Decreased range of motion;Decreased activity tolerance;Decreased balance;Decreased mobility;Decreased knowledge of use of DME;Decreased safety awareness;Decreased knowledge of precautions;Cardiopulmonary status limiting activity       PT Treatment Interventions DME instruction;Gait training;Stair training;Functional mobility training;Therapeutic activities;Therapeutic exercise;Neuromuscular re-education;Patient/family education    PT Goals (Current goals can be found in the Care Plan section)  Acute Rehab PT Goals Patient Stated Goal: Back to PLOF PT Goal Formulation: With patient Time For Goal Achievement: 07/05/17 Potential to Achieve Goals: Good    Frequency Min 3X/week   Barriers to discharge        Co-evaluation               AM-PAC PT "6 Clicks" Daily Activity  Outcome Measure Difficulty turning over in bed (including adjusting bedclothes, sheets and blankets)?: None Difficulty moving from lying on back to sitting on the side of the bed? : A Little Difficulty sitting down on and standing up from a chair with arms (e.g., wheelchair, bedside commode, etc,.)?: A Little Help needed moving to and from a bed to chair (including a wheelchair)?: A Little Help needed walking in hospital room?: A Little Help needed climbing 3-5 steps with a railing? : A Little 6 Click Score: 19    End of Session Equipment Utilized During Treatment: Gait belt Activity Tolerance: Patient limited by fatigue Patient left: in bed;with call bell/phone within  reach Nurse Communication: Mobility status PT Visit Diagnosis: Unsteadiness on feet (R26.81);Other abnormalities of gait and mobility (R26.89)    Time: 8938-1017 PT Time Calculation (min) (ACUTE ONLY): 18 min   Charges:   PT Evaluation $PT Eval Moderate Complexity: 1 Mod     PT G Codes:        Rolinda Roan, PT, DPT Acute Rehabilitation Services Pager: [phone]   Thelma Comp 06/21/2017, 11:34 AM

## 2017-06-21 NOTE — Progress Notes (Signed)
PROGRESS NOTE    [name]  PHX:[id number] DOB: 06-08-1956 DOA: 06/18/2017 PCP: Clent Demark, PA-C  Brief Narrative:  The patient  is a 61 year old female with history of hypertension, hyperlipidemia, prior history of TIA, and other comorbids who presented with sudden onset of nausea, vomiting on the day of admission.  Patient reported feeling generalized weakness, generalized abdominal discomfort with vomiting, diarrhea.  She denied any sick contacts but admitted to recent food intake at the restaurants. CT abdomen pelvis showed enteritis. Slowly improving and diarrhea slowing down and diet will be advanced to soft diet.   Assessment & Plan:   Principal Problem:   Enteritis Active Problems:   Accelerated hypertension   Depression   TIA (transient ischemic attack)   Cerebral aneurysm without rupture   Hypokalemia   Essential hypertension   Abdominal pain   Hyperlipidemia   Elevated glucose  Acute Enteritis with Abdominal Pain/Nausea/Vomiting and Diarrhea -CT abdomen and pelvis showed mild nonspecific wall thickening of a few proximal jejunal loops may be due to regional enteritis of inflammatory or infectious nature.   -Leukocytosis 16.3 at the time of admission, improving and now 12.0. -Vomiting improved and Diarrhea slowing down  -Obtained GI Pathogen Panel and was Negative -IV Fluid Hydration now D/C'd,  -Full Liquid Diet now advanced to Soft Diet  -Continue IV Rocephin and Flagyl -Pain Control with Hydrocodone -PT Eval and Treat recommending no Follow up  Hypokalemia -Potassium 2.8 on Admission likley due to diarrhea; This AM was 3.7 -Replete today with po KCl 40 mEQ once  -Continue to Monitor and Replete as Necessary,  -Keep potassium ~4, magnesium ~2 -Magnesium today was 1.8 and given 2 grams of IV Mag Sulfate -Repeat CMP and Mag Level in AM   Prolonged QTC, improved -EKG 12/25 showed QTC of 664. EKG in ED, showed QTC of 437 -Discontinued scheduled  Reglan,  -Obtained repeat EKG and qTC was 441.  -If still prolonged consistently with QTC >500, DC Zofran as well.  Accelerated Hypertension -BP 167/90 at the time of admission; Now 157/97 -Continue Amlodipine 10 mg po Daily and Metoprolol 50 mg po Daily -C/w Hydralazine 5-10 mg IV q8hprn HBP SBP>180  Pre-Diabetes/Impaired Fasting Glucose -Patient was noted to have glucose level 208 at the time of admission; Possibly Reactive  -No prior history of Diabetes -Obtained Hemoglobin A1c and was 6.4 -CBG's ranging from 97-131 -Advised Dietary Changes   Hyperlipidemia,  -Lipid Panel showed Cholesterol of 186, HDL of 70, LDL of 104, TG of 62, VLDL of 12 -C/w Atorvastatin 40 mg po Daily  History of TIA -C/w ASA 81 mg po Daily and Atorvastatin 40 mg po Daily  Depression -Continue Paroxetine 10 mg po Daily   Thrombocytosis -Improved -Likely Reactive; Platelet Count now 357 -Continue to Monitor and Repeat CBC in AM   DVT prophylaxis: SCDs; Encourage Ambulation  Code Status: FULL CODE Family Communication: No Family present at bedside Disposition Plan: Anticipate D/C in AM if medically stable  Consultants:   None   Procedures: None   Antimicrobials:  Anti-infectives (From admission, onward)   Start     Dose/Rate Route Frequency Ordered Stop   06/19/17 0800  metroNIDAZOLE (FLAGYL) IVPB 500 mg     500 mg 100 mL/hr over 60 Minutes Intravenous Every 8 hours 06/19/17 0644     06/19/17 0730  cefTRIAXone (ROCEPHIN) 1 g in dextrose 5 % 50 mL IVPB     1 g 100 mL/hr over 30 Minutes Intravenous Every 24 hours 06/19/17  0644       Subjective: Seen and examined at beside was feeling better but gets nauseous in the AM. States diarrhea has been slowing down and had 5 episodes of loose stools yesterday. None today. No CP or SOB. Denies any other complaints at this time.   Objective: Vitals:   06/21/17 0406 06/21/17 0500 06/21/17 1042 06/21/17 1339  BP: (!) 151/85  (!) 156/89 133/84    Pulse: 65   75  Resp: 18   16  Temp: 98.6 F (37 C)   98.1 F (36.7 C)  TempSrc:    Oral  SpO2: 98%   100%  Weight:  48.5 kg (106 lb 14.8 oz)    Height:        Intake/Output Summary (Last 24 hours) at 06/21/2017 1405 Last data filed at 06/21/2017 1300 Gross per 24 hour  Intake 1080 ml  Output 650 ml  Net 430 ml   Filed Weights   06/19/17 0500 06/20/17 0500 06/21/17 0500  Weight: 50.5 kg (111 lb 5.3 oz) 49.6 kg (109 lb 5.6 oz) 48.5 kg (106 lb 14.8 oz)   Examination: Physical Exam:  Constitutional: Thin AAF in NAD appears calm and comfortable.  Eyes: Sclerae anicteric. Lids and conjunctivae appear normal ENMT: External Ears and Nose appear normal. Grossly normal hearing. MMM Neck: Supple with no JVD Respiratory: CTAB; No appreciable wheezing/rales/rhonchi. Patient was not tachypenic or using any accessory muscles to breathe Cardiovascular: RRR; no m/r/g. S1/S2 No extremity edema Abdomen: Soft, slightly tender, ND. Bowel sounds present GU: Deferred.  Musculoskeletal: No contractures; No cyanosis Skin: Warm and dry. No rashes or lesions on a limited skin eval Neurologic: CN 2-12 grossly intact. No appreciable focal deficits Psychiatric: Normal mood and affect. Intact judgement and insight  Data Reviewed: I have personally reviewed following labs and imaging studies  CBC: Recent Labs  Lab 06/18/17 0938 06/19/17 0449 06/20/17 0729 06/21/17 0619  WBC 16.3* 14.2* 14.3* 12.0*  NEUTROABS 12.0*  --   --  8.3*  HGB 12.8 11.1* 12.1 12.3  HCT 38.1 32.5* 36.3 37.3  MCV 84.9 83.3 85.4 85.4  PLT 406* 337 369 259   Basic Metabolic Panel: Recent Labs  Lab 06/18/17 0939 06/18/17 1633 06/19/17 0449 06/19/17 1353 06/20/17 0729 06/20/17 1100 06/21/17 0619  NA 140  --  138  --  139  --  138  K 3.1*  --  2.8* 3.4* 3.3*  --  3.7  CL 102  --  105  --  103  --  106  CO2 22  --  21*  --  22  --  25  GLUCOSE 208*  --  112*  --  119*  --  110*  BUN 12  --  10  --  6  --  8   CREATININE 0.64  --  0.68  --  0.60  --  0.65  CALCIUM 10.1  --  8.8*  --  9.2  --  9.3  MG  --  2.0 1.8  --   --  1.9 1.8   GFR: Estimated Creatinine Clearance: 55.8 mL/min (by C-G formula based on SCr of 0.65 mg/dL). Liver Function Tests: Recent Labs  Lab 06/18/17 0939 06/19/17 0449 06/21/17 0619  AST 29 22 19   ALT 32 24 19  ALKPHOS 170* 138* 124  BILITOT 0.7 0.8 0.9  PROT 8.2* 7.1 6.6  ALBUMIN 4.2 3.5 3.3*   Recent Labs  Lab 06/18/17 0938  LIPASE 25   No results  for input(s): AMMONIA in the last 168 hours. Coagulation Profile: Recent Labs  Lab 06/19/17 0449  INR 0.95   Cardiac Enzymes: No results for input(s): CKTOTAL, CKMB, CKMBINDEX, TROPONINI in the last 168 hours. BNP (last 3 results) No results for input(s): PROBNP in the last 8760 hours. HbA1C: Recent Labs    06/21/17 0610  HGBA1C 6.4*   CBG: Recent Labs  Lab 06/20/17 1218 06/20/17 1724 06/20/17 2140 06/21/17 0757 06/21/17 1134  GLUCAP 104* 109* 131* 97 96   Lipid Profile: Recent Labs    06/18/17 1633  CHOL 186  HDL 70  LDLCALC 104*  TRIG 62  CHOLHDL 2.7   Thyroid Function Tests: No results for input(s): TSH, T4TOTAL, FREET4, T3FREE, THYROIDAB in the last 72 hours. Anemia Panel: No results for input(s): VITAMINB12, FOLATE, FERRITIN, TIBC, IRON, RETICCTPCT in the last 72 hours. Sepsis Labs: No results for input(s): PROCALCITON, LATICACIDVEN in the last 168 hours.  Recent Results (from the past 240 hour(s))  Gastrointestinal Panel by PCR , Stool     Status: None   Collection Time: 06/19/17  9:18 AM  Result Value Ref Range Status   Campylobacter species NOT DETECTED NOT DETECTED Final   Plesimonas shigelloides NOT DETECTED NOT DETECTED Final   Salmonella species NOT DETECTED NOT DETECTED Final   Yersinia enterocolitica NOT DETECTED NOT DETECTED Final   Vibrio species NOT DETECTED NOT DETECTED Final   Vibrio cholerae NOT DETECTED NOT DETECTED Final   Enteroaggregative E coli (EAEC)  NOT DETECTED NOT DETECTED Final   Enteropathogenic E coli (EPEC) NOT DETECTED NOT DETECTED Final   Enterotoxigenic E coli (ETEC) NOT DETECTED NOT DETECTED Final   Shiga like toxin producing E coli (STEC) NOT DETECTED NOT DETECTED Final   Shigella/Enteroinvasive E coli (EIEC) NOT DETECTED NOT DETECTED Final   Cryptosporidium NOT DETECTED NOT DETECTED Final   Cyclospora cayetanensis NOT DETECTED NOT DETECTED Final   Entamoeba histolytica NOT DETECTED NOT DETECTED Final   Giardia lamblia NOT DETECTED NOT DETECTED Final   Adenovirus F40/41 NOT DETECTED NOT DETECTED Final   Astrovirus NOT DETECTED NOT DETECTED Final   Norovirus GI/GII NOT DETECTED NOT DETECTED Final   Rotavirus A NOT DETECTED NOT DETECTED Final   Sapovirus (I, II, IV, and V) NOT DETECTED NOT DETECTED Final    Comment: Performed at [facility], [address] [place], [place] [address]    Radiology Studies: No results found.  Scheduled Meds: . amLODipine  10 mg Oral Daily  . aspirin EC  81 mg Oral Daily  . atorvastatin  40 mg Oral Daily  . feeding supplement  1 Container Oral TID BM  . feeding supplement (ENSURE ENLIVE)  237 mL Oral BID BM  . insulin aspart  0-9 Units Subcutaneous TID WC  . metoprolol succinate  50 mg Oral Daily  . PARoxetine  10 mg Oral Daily   Continuous Infusions: . cefTRIAXone (ROCEPHIN)  IV Stopped (06/21/17 0807)  . metronidazole Stopped (06/21/17 0942)    LOS: 3 days   Kerney Elbe, DO [facility] Pager [phone]  If 7PM-7AM, please contact night-coverage [email or web address] Password [email or web address] 06/21/2017, 2:05 PM

## 2017-06-21 NOTE — Progress Notes (Signed)
Initial Nutrition Assessment  DOCUMENTATION CODES:   Not applicable  INTERVENTION:   -D/c Boost Breeze po TID, each supplement provides 250 kcal and 9 grams of protein -Ensure Enlive po TID, each supplement provides 350 kcal and 20 grams of protein  NUTRITION DIAGNOSIS:   Inadequate oral intake related to altered GI function as evidenced by percent weight loss(variable intake).  GOAL:   Patient will meet greater than or equal to 90% of their needs  MONITOR:   PO intake, Supplement acceptance, Diet advancement, Labs, Weight trends, I & O's, Skin  REASON FOR ASSESSMENT:   Malnutrition Screening Tool    ASSESSMENT:   Patient is a 61 year old female with history of hypertension, hyperlipidemia prior history of TIA presented with sudden onset of nausea, vomiting on the day of admission.  Patient reported feeling generalized weakness, generalized abdominal discomfort with vomiting, diarrhea.  She denied any sick contacts but admitted to recent food intake at the restaurants.   Pt admitted with acute enteritis.   Pt experiencing GI distress at time of visit. Unable to obtain further hx of perform nutrition-focused physical exam at this time.  Meal completion variable; PO: 30-100% of meals. Pt refusing Boost Breeze, however, taking Ensure supplements.   Wt hx reviewed. Noted pt has experienced a 10# (8.6%) wt loss within the past 2 months, which is significant for time frame. Weight loss is concerning when coupled with poor oral intake and GI distress.   Labs reviewed: CBGS: 76-131 (inpatient orders for glycemic control are 0-9 units insuluinn aspart TID with meals)  Diet Order:  DIET SOFT Room service appropriate? Yes; Fluid consistency: Thin  EDUCATION NEEDS:   Not appropriate for education at this time  Skin:  Skin Assessment: Reviewed RN Assessment  Last BM:  06/20/17  Height:   Ht Readings from Last 1 Encounters:  06/18/17 5\' 2"  (1.575 m)    Weight:   Wt  Readings from Last 1 Encounters:  06/21/17 106 lb 14.8 oz (48.5 kg)    Ideal Body Weight:  50 kg  BMI:  Body mass index is 19.56 kg/m.  Estimated Nutritional Needs:   Kcal:  1450-1650  Protein:  70-85 grams  Fluid:  > 1.4 L   Tekelia Kareem A. Jimmye Norman, RD, LDN, CDE Pager: [phone] After hours Pager: [phone]

## 2017-06-22 DIAGNOSIS — R7301 Impaired fasting glucose: Secondary | ICD-10-CM

## 2017-06-22 DIAGNOSIS — R7303 Prediabetes: Secondary | ICD-10-CM

## 2017-06-22 LAB — CBC WITH DIFFERENTIAL/PLATELET
Basophils Absolute: 0 10*3/uL (ref 0.0–0.1)
Basophils Relative: 0 %
Eosinophils Absolute: 0.2 10*3/uL (ref 0.0–0.7)
Eosinophils Relative: 1 %
HCT: 35.7 % — ABNORMAL LOW (ref 36.0–46.0)
Hemoglobin: 11.6 g/dL — ABNORMAL LOW (ref 12.0–15.0)
Lymphocytes Relative: 29 %
Lymphs Abs: 3.5 10*3/uL (ref 0.7–4.0)
MCH: 27.4 pg (ref 26.0–34.0)
MCHC: 32.5 g/dL (ref 30.0–36.0)
MCV: 84.2 fL (ref 78.0–100.0)
Monocytes Absolute: 0.6 10*3/uL (ref 0.1–1.0)
Monocytes Relative: 5 %
Neutro Abs: 7.8 10*3/uL (ref 1.7–7.7)
Neutrophils Relative %: 65 %
Platelets: 333 10*3/uL (ref 150–400)
RBC: 4.24 MIL/uL (ref 3.87–5.11)
RDW: 13.2 % (ref 11.5–15.5)
WBC: 12.1 10*3/uL — ABNORMAL HIGH (ref 4.0–10.5)

## 2017-06-22 LAB — COMPREHENSIVE METABOLIC PANEL
ALT: 21 U/L (ref 14–54)
AST: 25 U/L (ref 15–41)
Albumin: 3.4 g/dL — ABNORMAL LOW (ref 3.5–5.0)
Alkaline Phosphatase: 118 U/L (ref 38–126)
Anion gap: 7 (ref 5–15)
BUN: 10 mg/dL (ref 6–20)
CO2: 24 mmol/L (ref 22–32)
Calcium: 9.2 mg/dL (ref 8.9–10.3)
Chloride: 106 mmol/L (ref 101–111)
Creatinine, Ser: 0.64 mg/dL (ref 0.44–1.00)
GFR calc Af Amer: >60 mL/min (ref >60)
GFR calc non Af Amer: >60 mL/min (ref >60)
Glucose, Bld: 113 mg/dL — ABNORMAL HIGH (ref 65–99)
Potassium: 4.1 mmol/L (ref 3.5–5.1)
Sodium: 137 mmol/L (ref 135–145)
Total Bilirubin: 0.6 mg/dL (ref 0.3–1.2)
Total Protein: 7 g/dL (ref 6.5–8.1)

## 2017-06-22 LAB — PHOSPHORUS: Phosphorus: 3.8 mg/dL (ref 2.5–4.6)

## 2017-06-22 LAB — GLUCOSE, CAPILLARY
Glucose-Capillary: 118 mg/dL — ABNORMAL HIGH (ref 65–99)
Glucose-Capillary: 129 mg/dL — ABNORMAL HIGH (ref 65–99)

## 2017-06-22 LAB — MAGNESIUM: Magnesium: 2.1 mg/dL (ref 1.7–2.4)

## 2017-06-22 MED ORDER — METRONIDAZOLE 500 MG PO TABS: 500.0000 mg | ORAL_TABLET | Freq: Three times a day (TID) | ORAL | 0 refills | Status: AC

## 2017-06-22 MED ORDER — ENSURE ENLIVE PO LIQD: 237.0000 mL | Freq: Three times a day (TID) | ORAL | 12 refills | Status: DC

## 2017-06-22 MED ORDER — CIPROFLOXACIN HCL 500 MG PO TABS: 500.0000 mg | ORAL_TABLET | Freq: Two times a day (BID) | ORAL | 0 refills | Status: AC

## 2017-06-22 NOTE — Discharge Summary (Signed)
Physician Discharge Summary  MARYLON VERNO OJJ:[id number] DOB: 28-Jul-1955 DOA: 06/18/2017  PCP: Clent Demark, PA-C  Admit date: 06/18/2017 Discharge date: 06/22/2017  Admitted From: Home Disposition:  Home  Recommendations for Outpatient Follow-up:  1. Follow up with PCP in 1-2 weeks 2. Please obtain BMP/CBC in one week 3. Please follow up on the following pending results:  Home Health: No Equipment/Devices: None  Discharge Condition: Stable  CODE STATUS: Full Code  Diet recommendation: Heart Healthy Carb Modified Diet  Brief/Interim Summary: The patient  is a 61 year old female with history of hypertension, hyperlipidemia, prior history of TIA, and other comorbids who presented with sudden onset of nausea, vomiting on the day of admission. Patient reported feeling generalized weakness, generalized abdominal discomfort with vomiting, diarrhea.She denied any sick contacts but admitted to recent food intake at the restaurants. CT abdomen pelvis showed enteritis and she was placed on IV Abx. She improved diarrhea slowed down and diet will be advanced to soft diet without issue. She was deemed medically stable to D/C home and follow up with PCP as an outpatient.   Discharge Diagnoses:  Principal Problem:   Enteritis Active Problems:   Accelerated hypertension   Depression   TIA (transient ischemic attack)   Cerebral aneurysm without rupture   Hypokalemia   Essential hypertension   Abdominal pain   Hyperlipidemia   Elevated glucose  Acute Enteritiswith Abdominal Pain/Nausea/Vomiting and Diarrhea -CT abdomen and pelvis showed mild nonspecific wall thickening of a few proximal jejunal loops may be due to regional enteritis of inflammatory or infectious nature. -Leukocytosis 16.3 at the time of admission, improving and now 12.1. -Vomiting improved and Diarrhea slowing down  -Obtained GI Pathogen Panel and was Negative -IV Fluid Hydration now D/C'd,  -Full Liquid  Diet now advanced to Soft Diet and patient tolerated that well -IV Rocephin and Flagyl changed to po Cipro/Flagyl for completion of course -Pain Control with Hydrocodone while hospitalized  -PT Eval and Treat recommending no Follow up  Hypokalemia -Potassium 2.8 on Admission likley due to diarrhea; This AM was 4.1 -Continue to Monitor and Replete as Necessary,  -Keep potassium~4, magnesium~2 -Magnesium today was 2.1  -Repeat CMP and Mag Level at PCP  Prolonged QTC, improved -EKG 12/25 showed QTC of 664.EKG in ED, showed QTC of 437 -Discontinued scheduled Reglan,  -Obtained repeat EKG and qTC was 441. -Follow up EKG at PCP   Accelerated Hypertension -BP 167/90 at the time of admission; Now 132/75 -Continue Amlodipine 10 mg po Daily and Metoprolol 50 mg po Daily -C/w Hydralazine 5-10 mg IV q8hprn HBP SBP>180while hospitalized   Pre-Diabetes/Impaired Fasting Glucose -Patient was noted to have glucose level 208 at the time of admission; Possibly Reactive  -No prior history of Diabetes -Obtained Hemoglobin A1c and was 6.4 -CBG's ranging from 105-129 -Advised Dietary Changes   Hyperlipidemia,  -Lipid Panel showed Cholesterol of 186, HDL of 70, LDL of 104, TG of 62, VLDL of 12 -C/w Atorvastatin 40 mg po Daily  History of TIA -C/w ASA 81 mg po Daily and Atorvastatin 40 mg po Daily  Depression -Continue Paroxetine 10 mg po Daily   Thrombocytosis -Improved -Likely Reactive; Platelet Count now 333 -Continue to Monitor and Repeat CBC at PCP's   Discharge Instructions  Discharge Instructions    Call MD for:  difficulty breathing, headache or visual disturbances   Complete by:  As directed    Call MD for:  extreme fatigue   Complete by:  As directed    Call  MD for:  hives   Complete by:  As directed    Call MD for:  persistant dizziness or light-headedness   Complete by:  As directed    Call MD for:  persistant nausea and vomiting   Complete by:  As directed     Call MD for:  redness, tenderness, or signs of infection (pain, swelling, redness, odor or green/yellow discharge around incision site)   Complete by:  As directed    Call MD for:  severe uncontrolled pain   Complete by:  As directed    Call MD for:  temperature >100.4   Complete by:  As directed    Diet - low sodium heart healthy   Complete by:  As directed    Diet Carb Modified   Complete by:  As directed    Discharge instructions   Complete by:  As directed    Follow up with PCP as an outpatient. Take all medications as prescribed. If symptoms change or worsen please return to the ED for evaluation.   Increase activity slowly   Complete by:  As directed      Allergies as of 06/22/2017   No Known Allergies     Medication List    STOP taking these medications   naproxen 500 MG tablet Commonly known as:  NAPROSYN     TAKE these medications   amLODipine 10 MG tablet Commonly known as:  NORVASC Take 1 tablet (10 mg total) by mouth daily.   aspirin EC 81 MG tablet Take 1 tablet (81 mg total) by mouth daily.   atorvastatin 40 MG tablet Commonly known as:  LIPITOR Take 1 tablet (40 mg total) daily by mouth.   ciprofloxacin 500 MG tablet Commonly known as:  CIPRO Take 1 tablet (500 mg total) by mouth 2 (two) times daily for 3 days.   feeding supplement (ENSURE ENLIVE) Liqd Take 237 mLs by mouth 3 (three) times daily between meals.   lisinopril-hydrochlorothiazide 10-12.5 MG tablet Commonly known as:  ZESTORETIC Take 2 tablets by mouth daily.   metoprolol succinate 25 MG 24 hr tablet Commonly known as:  TOPROL-XL Take 2 tablets (50 mg total) by mouth daily.   metroNIDAZOLE 500 MG tablet Commonly known as:  FLAGYL Take 1 tablet (500 mg total) by mouth 3 (three) times daily for 3 days.   PARoxetine 10 MG tablet Commonly known as:  PAXIL Take 1 tablet (10 mg total) by mouth daily.       No Known Allergies  Consultations:  None  Procedures/Studies: Dg  Chest 2 View  Result Date: 06/18/2017 CLINICAL DATA:  Nausea and vomiting.  Chest discomfort. EXAM: CHEST  2 VIEW COMPARISON:  09/01/2016 FINDINGS: Both lungs are clear. Heart and mediastinum are within normal limits. Surgical plate in the lower cervical spine. No pleural effusions. No acute bone abnormality. IMPRESSION: No active cardiopulmonary disease. Electronically Signed   By: Markus Daft M.D.   On: 06/18/2017 10:32   Ct Abdomen Pelvis W Contrast  Result Date: 06/18/2017 CLINICAL DATA:  Nausea and vomiting since this morning. EXAM: CT ABDOMEN AND PELVIS WITH CONTRAST TECHNIQUE: Multidetector CT imaging of the abdomen and pelvis was performed using the standard protocol following bolus administration of intravenous contrast. CONTRAST:  80 mL ISOVUE-300 IOPAMIDOL (ISOVUE-300) INJECTION 61% COMPARISON:  10/14/2014 and 12/21/2007 FINDINGS: Lower chest: No acute abnormality. Hepatobiliary: Previous cholecystectomy. Liver and biliary tree are unremarkable. Pancreas: Normal. Spleen: Normal. Adrenals/Urinary Tract: Adrenal glands are normal. Kidneys are normal in size  with a few small subcentimeter bilateral renal cortical hypodensities too small to characterize but likely cysts. 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 Ureters and bladder are unremarkable. Stomach/Bowel: Stomach is normal. There are a few proximal jejunal loops with mild wall thickening. Appendix normal. Transverse and descending colon are decompressed. There is minimal diverticulosis of the colon. Vascular/Lymphatic: Minimal calcified plaque over the abdominal aorta and iliac arteries. No adenopathy. Reproductive: Within normal. Other: Postsurgical change over the anterior abdominal wall. No free fluid. Musculoskeletal: Mild degenerate change of the spine and hips. Grade 1 anterolisthesis of L4 on L5 unchanged. IMPRESSION: Mild nonspecific wall thickening of a few proximal jejunal loops  which may be due to a regional enteritis of inflammatory or infectious nature. Diverticulosis of the colon. 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 Aortic Atherosclerosis (ICD10-I70.0). Electronically Signed   By: Marin Olp M.D.   On: 06/18/2017 12:10    Subjective: Seen and examined and felt well and no Nausea/Vomiting/Diarrhea. Just felt fatigued. Ready to go home.  Discharge Exam: Vitals:   06/22/17 0548 06/22/17 1300  BP: (!) 160/95 132/76  Pulse: 79 75  Resp: 20 16  Temp: 98.6 F (37 C) 98.6 F (37 C)  SpO2: 100% 100%   Vitals:   06/21/17 1339 06/21/17 2100 06/22/17 0548 06/22/17 1300  BP: 133/84 (!) 155/89 (!) 160/95 132/76  Pulse: 75 70 79 75  Resp: 16 18 20 16   Temp: 98.1 F (36.7 C) 98.2 F (36.8 C) 98.6 F (37 C) 98.6 F (37 C)  TempSrc: Oral Oral Oral Oral  SpO2: 100% 100% 100% 100%  Weight:      Height:       General: Pt is a pleasant AAF who is alert, awake, not in acute distress Cardiovascular: RRR, S1/S2 +, no rubs, no gallops Respiratory: CTA bilaterally, no wheezing, no rhonchi Abdominal: Soft, NT, ND, bowel sounds + Extremities: no edema, no cyanosis  The results of significant diagnostics from this hospitalization (including imaging, microbiology, ancillary and laboratory) are listed below for reference.    Microbiology: Recent Results (from the past 240 hour(s))  Gastrointestinal Panel by PCR , Stool     Status: None   Collection Time: 06/19/17  9:18 AM  Result Value Ref Range Status   Campylobacter species NOT DETECTED NOT DETECTED Final   Plesimonas shigelloides NOT DETECTED NOT DETECTED Final   Salmonella species NOT DETECTED NOT DETECTED Final   Yersinia enterocolitica NOT DETECTED NOT DETECTED Final   Vibrio species NOT DETECTED NOT DETECTED Final   Vibrio cholerae NOT DETECTED NOT DETECTED Final   Enteroaggregative E coli (EAEC) NOT DETECTED NOT DETECTED Final   Enteropathogenic  E coli (EPEC) NOT DETECTED NOT DETECTED Final   Enterotoxigenic E coli (ETEC) NOT DETECTED NOT DETECTED Final   Shiga like toxin producing E coli (STEC) NOT DETECTED NOT DETECTED Final   Shigella/Enteroinvasive E coli (EIEC) NOT DETECTED NOT DETECTED Final   Cryptosporidium NOT DETECTED NOT DETECTED Final   Cyclospora cayetanensis NOT DETECTED NOT DETECTED Final   Entamoeba histolytica NOT DETECTED NOT DETECTED Final   Giardia lamblia NOT DETECTED NOT DETECTED Final   Adenovirus F40/41 NOT DETECTED NOT DETECTED Final   Astrovirus NOT DETECTED NOT DETECTED Final   Norovirus GI/GII NOT DETECTED NOT DETECTED Final   Rotavirus A NOT DETECTED NOT DETECTED Final   Sapovirus (I, II, IV, and V) NOT DETECTED NOT DETECTED Final    Comment:  Performed at [facility], [address] [place], [place] [address]    Labs: BNP (last 3 results) No results for input(s): BNP in the last 8760 hours. Basic Metabolic Panel: Recent Labs  Lab 06/18/17 0939 06/18/17 1633 06/19/17 0449 06/19/17 1353 06/20/17 0729 06/20/17 1100 06/21/17 0619 06/22/17 0709  NA 140  --  138  --  139  --  138 137  K 3.1*  --  2.8* 3.4* 3.3*  --  3.7 4.1  CL 102  --  105  --  103  --  106 106  CO2 22  --  21*  --  22  --  25 24  GLUCOSE 208*  --  112*  --  119*  --  110* 113*  BUN 12  --  10  --  6  --  8 10  CREATININE 0.64  --  0.68  --  0.60  --  0.65 0.64  CALCIUM 10.1  --  8.8*  --  9.2  --  9.3 9.2  MG  --  2.0 1.8  --   --  1.9 1.8 2.1  PHOS  --   --   --   --   --   --   --  3.8   Liver Function Tests: Recent Labs  Lab 06/18/17 0939 06/19/17 0449 06/21/17 0619 06/22/17 0709  AST 29 22 19 25   ALT 32 24 19 21   ALKPHOS 170* 138* 124 118  BILITOT 0.7 0.8 0.9 0.6  PROT 8.2* 7.1 6.6 7.0  ALBUMIN 4.2 3.5 3.3* 3.4*   Recent Labs  Lab 06/18/17 0938  LIPASE 25   No results for input(s): AMMONIA in the last 168 hours. CBC: Recent Labs  Lab 06/18/17 0938 06/19/17 0449 06/20/17 0729  06/21/17 0619 06/22/17 0709  WBC 16.3* 14.2* 14.3* 12.0* 12.1*  NEUTROABS 12.0*  --   --  8.3* 7.8  HGB 12.8 11.1* 12.1 12.3 11.6*  HCT 38.1 32.5* 36.3 37.3 35.7*  MCV 84.9 83.3 85.4 85.4 84.2  PLT 406* 337 369 357 333   Cardiac Enzymes: No results for input(s): CKTOTAL, CKMB, CKMBINDEX, TROPONINI in the last 168 hours. BNP: Invalid input(s): POCBNP CBG: Recent Labs  Lab 06/21/17 1134 06/21/17 1729 06/21/17 2251 06/22/17 0820 06/22/17 1236  GLUCAP 96 105* 116* 118* 129*   D-Dimer No results for input(s): DDIMER in the last 72 hours. Hgb A1c Recent Labs    06/21/17 0610  HGBA1C 6.4*   Lipid Profile No results for input(s): CHOL, HDL, LDLCALC, TRIG, CHOLHDL, LDLDIRECT in the last 72 hours. Thyroid function studies No results for input(s): TSH, T4TOTAL, T3FREE, THYROIDAB in the last 72 hours.  Invalid input(s): FREET3 Anemia work up No results for input(s): VITAMINB12, FOLATE, FERRITIN, TIBC, IRON, RETICCTPCT in the last 72 hours. Urinalysis    Component Value Date/Time   COLORURINE STRAW (A) 06/18/2017 1058   APPEARANCEUR HAZY (A) 06/18/2017 1058   LABSPEC 1.010 06/18/2017 1058   PHURINE 8.0 06/18/2017 1058   GLUCOSEU 150 (A) 06/18/2017 1058   HGBUR NEGATIVE 06/18/2017 1058   BILIRUBINUR NEGATIVE 06/18/2017 1058   BILIRUBINUR neg 10/25/2014 1506   KETONESUR 20 (A) 06/18/2017 1058   PROTEINUR NEGATIVE 06/18/2017 1058   UROBILINOGEN 0.2 10/25/2014 1506   UROBILINOGEN 0.2 10/14/2014 1247   NITRITE NEGATIVE 06/18/2017 1058   LEUKOCYTESUR NEGATIVE 06/18/2017 1058   Sepsis Labs Invalid input(s): PROCALCITONIN,  WBC,  LACTICIDVEN Microbiology Recent Results (from the past 240 hour(s))  Gastrointestinal Panel by  PCR , Stool     Status: None   Collection Time: 06/19/17  9:18 AM  Result Value Ref Range Status   Campylobacter species NOT DETECTED NOT DETECTED Final   Plesimonas shigelloides NOT DETECTED NOT DETECTED Final   Salmonella species NOT DETECTED NOT  DETECTED Final   Yersinia enterocolitica NOT DETECTED NOT DETECTED Final   Vibrio species NOT DETECTED NOT DETECTED Final   Vibrio cholerae NOT DETECTED NOT DETECTED Final   Enteroaggregative E coli (EAEC) NOT DETECTED NOT DETECTED Final   Enteropathogenic E coli (EPEC) NOT DETECTED NOT DETECTED Final   Enterotoxigenic E coli (ETEC) NOT DETECTED NOT DETECTED Final   Shiga like toxin producing E coli (STEC) NOT DETECTED NOT DETECTED Final   Shigella/Enteroinvasive E coli (EIEC) NOT DETECTED NOT DETECTED Final   Cryptosporidium NOT DETECTED NOT DETECTED Final   Cyclospora cayetanensis NOT DETECTED NOT DETECTED Final   Entamoeba histolytica NOT DETECTED NOT DETECTED Final   Giardia lamblia NOT DETECTED NOT DETECTED Final   Adenovirus F40/41 NOT DETECTED NOT DETECTED Final   Astrovirus NOT DETECTED NOT DETECTED Final   Norovirus GI/GII NOT DETECTED NOT DETECTED Final   Rotavirus A NOT DETECTED NOT DETECTED Final   Sapovirus (I, II, IV, and V) NOT DETECTED NOT DETECTED Final    Comment: Performed at [facility], [address], [place], [place] [address]   Time coordinating discharge: 35 minutes  SIGNED:  Kerney Elbe, DO [facility] 06/22/2017, 2:12 PM Pager [phone]  If 7PM-7AM, please contact night-coverage [email or web address] Password [email or web address]

## 2017-06-22 NOTE — Progress Notes (Signed)
Pt for discharge going home, health teachings, next appointment, discontinued peripheral IV line, no complain of pain.

## 2017-07-01 ENCOUNTER — Other Ambulatory Visit: Payer: Self-pay

## 2017-07-01 ENCOUNTER — Ambulatory Visit (INDEPENDENT_AMBULATORY_CARE_PROVIDER_SITE_OTHER): Payer: Self-pay | Admitting: Physician Assistant

## 2017-07-01 ENCOUNTER — Encounter (INDEPENDENT_AMBULATORY_CARE_PROVIDER_SITE_OTHER): Payer: Self-pay | Admitting: Physician Assistant

## 2017-07-01 VITALS — BP 136/83 | HR 73 | Temp 97.7°F | Wt 110.8 lb

## 2017-07-01 DIAGNOSIS — R7303 Prediabetes: Secondary | ICD-10-CM

## 2017-07-01 DIAGNOSIS — Z760 Encounter for issue of repeat prescription: Secondary | ICD-10-CM

## 2017-07-01 DIAGNOSIS — Z09 Encounter for follow-up examination after completed treatment for conditions other than malignant neoplasm: Secondary | ICD-10-CM

## 2017-07-01 MED ORDER — METOPROLOL SUCCINATE ER 25 MG PO TB24: 50.0000 mg | ORAL_TABLET | Freq: Every day | ORAL | 3 refills | Status: DC

## 2017-07-01 MED ORDER — LISINOPRIL-HYDROCHLOROTHIAZIDE 10-12.5 MG PO TABS: 2.0000 | ORAL_TABLET | Freq: Every day | ORAL | 3 refills | Status: DC

## 2017-07-01 MED ORDER — BLOOD GLUCOSE MONITOR KIT: PACK | 0 refills | Status: DC

## 2017-07-01 MED ORDER — METFORMIN HCL ER 500 MG PO TB24: 500.0000 mg | ORAL_TABLET | Freq: Every day | ORAL | 5 refills | Status: DC

## 2017-07-01 MED ORDER — PAROXETINE HCL 10 MG PO TABS: 10.0000 mg | ORAL_TABLET | Freq: Every day | ORAL | 3 refills | Status: DC

## 2017-07-01 MED ORDER — AMLODIPINE BESYLATE 10 MG PO TABS: 10.0000 mg | ORAL_TABLET | Freq: Every day | ORAL | 3 refills | Status: DC

## 2017-07-01 MED ORDER — ENSURE ENLIVE PO LIQD: 237.0000 mL | Freq: Three times a day (TID) | ORAL | 12 refills | Status: DC

## 2017-07-01 MED FILL — AMLODIPINE BESYLATE 10 MG T: 10 | 30 days supply | Qty: 30 | Fill #0 | Status: TO

## 2017-07-01 MED FILL — METFORMIN HCL ER 500 MG TAB: 500 | 30 days supply | Qty: 30 | Fill #0 | Status: TO

## 2017-07-01 MED FILL — PARoxetine HCL 10 MG TABS: 10 | 30 days supply | Qty: 30 | Fill #0 | Status: TO

## 2017-07-01 MED FILL — LISINOPRIL-HCTZ 10-12.5 MG: 10-12.5 | 30 days supply | Qty: 60 | Fill #0 | Status: TO

## 2017-07-01 MED FILL — METOPROLOL SUCC ER 25 MG TA: 25 | 30 days supply | Qty: 60 | Fill #0 | Status: TO

## 2017-07-01 NOTE — Progress Notes (Signed)
Subjective:  Patient ID: [name], female    DOB: [birth year]  Age: 62 y.o. MRN: [id number]  CC: hospital f/u  HPI  Marcia Zuckerman Godfreyis a 62 y.o.femalewith a medical history of TIA, HLD, Prediabetes, HTN, and depression presents on hospital visit f/u. Admitted on 06/18/17 and discharged 06/22/17. Diagnosed with acute enteritis with abdominal pain/nausea/vomiting and diarrhea. Leukocytosis of 16.3 which improved to 12.1 at discharge. Patient reports feeling better in regards to diarrhea, nausea, and vomiting. Feels fatigued and with decreased appetite. Does not endorse chest pain, palpitations, SOB, HA, abdominal pain, f/c/n/v, rash, or GI/GU sxs.      Outpatient Medications Prior to Visit  Medication Sig Dispense Refill  . amLODipine (NORVASC) 10 MG tablet Take 1 tablet (10 mg total) by mouth daily. 30 tablet 0  . aspirin EC 81 MG tablet Take 1 tablet (81 mg total) by mouth daily. 90 tablet 3  . atorvastatin (LIPITOR) 40 MG tablet Take 1 tablet (40 mg total) daily by mouth. 90 tablet 3  . feeding supplement, ENSURE ENLIVE, (ENSURE ENLIVE) LIQD Take 237 mLs by mouth 3 (three) times daily between meals. 237 mL 12  . lisinopril-hydrochlorothiazide (ZESTORETIC) 10-12.5 MG tablet Take 2 tablets by mouth daily. 60 tablet 0  . metoprolol succinate (TOPROL-XL) 25 MG 24 hr tablet Take 2 tablets (50 mg total) by mouth daily. 60 tablet 0  . PARoxetine (PAXIL) 10 MG tablet Take 1 tablet (10 mg total) by mouth daily. 30 tablet 2   No facility-administered medications prior to visit.      ROS Review of Systems  Constitutional: Positive for malaise/fatigue. Negative for chills and fever.  Eyes: Negative for blurred vision.  Respiratory: Negative for shortness of breath.   Cardiovascular: Negative for chest pain and palpitations.  Gastrointestinal: Negative for abdominal pain and nausea.  Genitourinary: Negative for dysuria and hematuria.  Musculoskeletal: Negative for joint pain and  myalgias.  Skin: Negative for rash.  Neurological: Negative for tingling and headaches.  Psychiatric/Behavioral: Negative for depression. The patient is not nervous/anxious.     Objective:  BP 136/83 (BP Location: Right Arm, Patient Position: Sitting, Cuff Size: Normal)   Pulse 73   Temp 97.7 F (36.5 C) (Oral)   Wt 110 lb 12.8 oz (50.3 kg)   SpO2 100%   BMI 20.27 kg/m   BP/Weight 07/01/2017 06/22/2017 46/28/6381  Systolic BP 771 165 -  Diastolic BP 83 76 -  Wt. (Lbs) 110.8 - 106.92  BMI 20.27 - 19.56      Physical Exam  Constitutional: She is oriented to person, place, and time.  Well developed, well nourished, NAD, polite  HENT:  Head: Normocephalic and atraumatic.  Eyes: No scleral icterus.  Neck: Normal range of motion. Neck supple. No thyromegaly present.  Cardiovascular: Normal rate, regular rhythm and normal heart sounds.  Pulmonary/Chest: Effort normal and breath sounds normal.  Abdominal: Soft. Bowel sounds are normal. There is no tenderness.  Musculoskeletal: She exhibits no edema.  Neurological: She is alert and oriented to person, place, and time.  Skin: Skin is warm and dry. No rash noted. No erythema. No pallor.  Psychiatric: She has a normal mood and affect. Her behavior is normal. Thought content normal.  Vitals reviewed.    Assessment & Plan:   1. Hospital discharge follow-up - Comprehensive metabolic panel - CBC with Differential  2. Prediabetes - Begin metFORMIN (GLUCOPHAGE XR) 500 MG 24 hr tablet; Take 1 tablet (500 mg total) by mouth daily with breakfast.  Dispense: 30 tablet; Refill: 5 - Begin blood glucose meter kit and supplies KIT; Dispense based on patient and insurance preference. Use up to twice daily as directed. (FOR ICD-9 250.00, 250.01).  Dispense: 1 each; Refill: 0  3. Medication refill - PARoxetine (PAXIL) 10 MG tablet; Take 1 tablet (10 mg total) by mouth daily.  Dispense: 90 tablet; Refill: 3 - metoprolol succinate (TOPROL-XL)  25 MG 24 hr tablet; Take 2 tablets (50 mg total) by mouth daily.  Dispense: 180 tablet; Refill: 3 - lisinopril-hydrochlorothiazide (ZESTORETIC) 10-12.5 MG tablet; Take 2 tablets by mouth daily.  Dispense: 180 tablet; Refill: 3 - feeding supplement, ENSURE ENLIVE, (ENSURE ENLIVE) LIQD; Take 237 mLs by mouth 3 (three) times daily between meals.  Dispense: 237 mL; Refill: 12 - amLODipine (NORVASC) 10 MG tablet; Take 1 tablet (10 mg total) by mouth daily.  Dispense: 90 tablet; Refill: 3    Meds ordered this encounter  Medications  . metFORMIN (GLUCOPHAGE XR) 500 MG 24 hr tablet    Sig: Take 1 tablet (500 mg total) by mouth daily with breakfast.    Dispense:  30 tablet    Refill:  5    Order Specific Question:   Supervising Provider    Answer:   Tresa Garter [id number]  . blood glucose meter kit and supplies KIT    Sig: Dispense based on patient and insurance preference. Use up to twice daily as directed. (FOR ICD-9 250.00, 250.01).    Dispense:  1 each    Refill:  0    Order Specific Question:   Number of strips    Answer:   100    Order Specific Question:   Number of lancets    Answer:   100    Order Specific Question:   Supervising Provider    Answer:   Tresa Garter [id number]  . PARoxetine (PAXIL) 10 MG tablet    Sig: Take 1 tablet (10 mg total) by mouth daily.    Dispense:  90 tablet    Refill:  3    Order Specific Question:   Supervising Provider    Answer:   Tresa Garter [id number]  . metoprolol succinate (TOPROL-XL) 25 MG 24 hr tablet    Sig: Take 2 tablets (50 mg total) by mouth daily.    Dispense:  180 tablet    Refill:  3    Order Specific Question:   Supervising Provider    Answer:   Tresa Garter [id number]  . lisinopril-hydrochlorothiazide (ZESTORETIC) 10-12.5 MG tablet    Sig: Take 2 tablets by mouth daily.    Dispense:  180 tablet    Refill:  3    Order Specific Question:   Supervising Provider    Answer:   Tresa Garter [id number]   . feeding supplement, ENSURE ENLIVE, (ENSURE ENLIVE) LIQD    Sig: Take 237 mLs by mouth 3 (three) times daily between meals.    Dispense:  237 mL    Refill:  12    Order Specific Question:   Supervising Provider    Answer:   Tresa Garter [id number]  . amLODipine (NORVASC) 10 MG tablet    Sig: Take 1 tablet (10 mg total) by mouth daily.    Dispense:  90 tablet    Refill:  3    Order Specific Question:   Supervising Provider    Answer:   Tresa Garter [id number]    Follow-up: 3 months for prediabetes.  Lesli Albee  Altamease Oiler PA

## 2017-07-01 NOTE — Patient Instructions (Signed)
Prediabetes Prediabetes is the condition of having a blood sugar (blood glucose) level that is higher than it should be, but not high enough for you to be diagnosed with type 2 diabetes. Having prediabetes puts you at risk for developing type 2 diabetes (type 2 diabetes mellitus). Prediabetes may be called impaired glucose tolerance or impaired fasting glucose. Prediabetes usually does not cause symptoms. Your health care provider can diagnose this condition with blood tests. You may be tested for prediabetes if you are overweight and if you have at least one other risk factor for prediabetes. Risk factors for prediabetes include:  Having a family member with type 2 diabetes.  Being overweight or obese.  Being older than age 57.  Being of American-Indian, African-American, Hispanic/Latino, or Asian/Pacific Islander descent.  Having an inactive (sedentary) lifestyle.  Having a history of gestational diabetes or polycystic ovarian syndrome (PCOS).  Having low levels of good cholesterol (HDL-C) or high levels of blood fats (triglycerides).  Having high blood pressure.  What is blood glucose and how is blood glucose measured?  Blood glucose refers to the amount of glucose in your bloodstream. Glucose comes from eating foods that contain sugars and starches (carbohydrates) that the body breaks down into glucose. Your blood glucose level may be measured in mg/dL (milligrams per deciliter) or mmol/L (millimoles per liter).Your blood glucose may be checked with one or more of the following blood tests:  A fasting blood glucose (FBG) test. You will not be allowed to eat (you will fast) for at least 8 hours before a blood sample is taken. ? A normal range for FBG is 70-100 mg/dl (3.9-5.6 mmol/L).  An A1c (hemoglobin A1c) blood test. This test provides information about blood glucose control over the previous 2?68month.  An oral glucose tolerance test (OGTT). This test measures your blood  glucose twice: ? After fasting. This is your baseline level. ? Two hours after you drink a beverage that contains glucose.  You may be diagnosed with prediabetes:  If your FBG is 100?125 mg/dL (5.6-6.9 mmol/L).  If your A1c level is 5.7?6.4%.  If your OGGT result is 140?199 mg/dL (7.8-11 mmol/L).  These blood tests may be repeated to confirm your diagnosis. What happens if blood glucose is too high? The pancreas produces a hormone (insulin) that helps move glucose from the bloodstream into cells. When cells in the body do not respond properly to insulin that the body makes (insulin resistance), excess glucose builds up in the blood instead of going into cells. As a result, high blood glucose (hyperglycemia) can develop, which can cause many complications. This is a symptom of prediabetes. What can happen if blood glucose stays higher than normal for a long time? Having high blood glucose for a long time is dangerous. Too much glucose in your blood can damage your nerves and blood vessels. Long-term damage can lead to complications from diabetes, which may include:  Heart disease.  Stroke.  Blindness.  Kidney disease.  Depression.  Poor circulation in the feet and legs, which could lead to surgical removal (amputation) in severe cases.  How can prediabetes be prevented from turning into type 2 diabetes?  To help prevent type 2 diabetes, take the following actions:  Be physically active. ? Do moderate-intensity physical activity for at least 30 minutes on at least 5 days of the week, or as much as told by your health care provider. This could be brisk walking, biking, or water aerobics. ? Ask your health care provider what  activities are safe for you. A mix of physical activities may be best, such as walking, swimming, cycling, and strength training.  Lose weight as told by your health care provider. ? Losing 5-7% of your body weight can reverse insulin resistance. ? Your health  care provider can determine how much weight loss is best for you and can help you lose weight safely.  Follow a healthy meal plan. This includes eating lean proteins, complex carbohydrates, fresh fruits and vegetables, low-fat dairy products, and healthy fats. ? Follow instructions from your health care provider about eating or drinking restrictions. ? Make an appointment to see a diet and nutrition specialist (registered dietitian) to help you create a healthy eating plan that is right for you.  Do not smoke or use any tobacco products, such as cigarettes, chewing tobacco, and e-cigarettes. If you need help quitting, ask your health care provider.  Take over-the-counter and prescription medicines as told by your health care provider. You may be prescribed medicines that help lower the risk of type 2 diabetes.  This information is not intended to replace advice given to you by your health care provider. Make sure you discuss any questions you have with your health care provider. Document Released: 10/03/2015 Document Revised: 11/17/2015 Document Reviewed: 08/02/2015 Elsevier Interactive Patient Education  2018 [employer]  

## 2017-07-02 LAB — CBC WITH DIFFERENTIAL/PLATELET
Basophils Absolute: 0.1 10*3/uL (ref 0.0–0.2)
Basos: 0 %
EOS (ABSOLUTE): 0.2 10*3/uL (ref 0.0–0.4)
Eos: 1 %
Hematocrit: 34.2 % (ref 34.0–46.6)
Hemoglobin: 11.1 g/dL (ref 11.1–15.9)
Immature Grans (Abs): 0.1 10*3/uL (ref 0.0–0.1)
Immature Granulocytes: 1 %
Lymphocytes Absolute: 3.2 10*3/uL — ABNORMAL HIGH (ref 0.7–3.1)
Lymphs: 28 %
MCH: 28 pg (ref 26.6–33.0)
MCHC: 32.5 g/dL (ref 31.5–35.7)
MCV: 86 fL (ref 79–97)
Monocytes Absolute: 0.5 10*3/uL (ref 0.1–0.9)
Monocytes: 4 %
Neutrophils Absolute: 7.4 10*3/uL — ABNORMAL HIGH (ref 1.4–7.0)
Neutrophils: 66 %
Platelets: 430 10*3/uL — ABNORMAL HIGH (ref 150–379)
RBC: 3.97 x10E6/uL (ref 3.77–5.28)
RDW: 14.2 % (ref 12.3–15.4)
WBC: 11.3 10*3/uL — ABNORMAL HIGH (ref 3.4–10.8)

## 2017-07-02 LAB — COMPREHENSIVE METABOLIC PANEL
ALT: 20 IU/L (ref 0–32)
AST: 19 IU/L (ref 0–40)
Albumin/Globulin Ratio: 1.4 (ref 1.2–2.2)
Albumin: 4 g/dL (ref 3.6–4.8)
Alkaline Phosphatase: 106 IU/L (ref 39–117)
BUN/Creatinine Ratio: 29 — ABNORMAL HIGH (ref 12–28)
BUN: 15 mg/dL (ref 8–27)
Bilirubin Total: <0.2 mg/dL (ref 0.0–1.2)
CO2: 23 mmol/L (ref 20–29)
Calcium: 9.6 mg/dL (ref 8.7–10.3)
Chloride: 103 mmol/L (ref 96–106)
Creatinine, Ser: 0.51 mg/dL — ABNORMAL LOW (ref 0.57–1.00)
GFR calc Af Amer: 119 mL/min/{1.73_m2} (ref >59)
GFR calc non Af Amer: 103 mL/min/{1.73_m2} (ref >59)
Globulin, Total: 2.8 g/dL (ref 1.5–4.5)
Glucose: 157 mg/dL — ABNORMAL HIGH (ref 65–99)
Potassium: 4.3 mmol/L (ref 3.5–5.2)
Sodium: 140 mmol/L (ref 134–144)
Total Protein: 6.8 g/dL (ref 6.0–8.5)

## 2017-07-03 ENCOUNTER — Telehealth (INDEPENDENT_AMBULATORY_CARE_PROVIDER_SITE_OTHER): Payer: Self-pay

## 2017-07-03 NOTE — Telephone Encounter (Signed)
-----   Message from Clent Demark, PA-C sent at 07/02/2017  5:42 PM EST ----- White blood cells still slightly elevated but better than before. Rest of labs show elevated glucose but we know she is prediabetic. No other abnormalities.

## 2017-07-03 NOTE — Telephone Encounter (Signed)
Left a message asking patient to call the office. Nat Christen, CMA

## 2017-07-04 ENCOUNTER — Encounter (INDEPENDENT_AMBULATORY_CARE_PROVIDER_SITE_OTHER): Payer: Self-pay

## 2017-07-04 ENCOUNTER — Telehealth (INDEPENDENT_AMBULATORY_CARE_PROVIDER_SITE_OTHER): Payer: Self-pay

## 2017-07-04 NOTE — Telephone Encounter (Signed)
Left another message asking patient to call the office, also mailed results. Nat Christen, CMA

## 2017-07-04 NOTE — Telephone Encounter (Signed)
-----   Message from Clent Demark, PA-C sent at 07/02/2017  5:42 PM EST ----- White blood cells still slightly elevated but better than before. Rest of labs show elevated glucose but we know she is prediabetic. No other abnormalities.

## 2017-07-10 ENCOUNTER — Ambulatory Visit (HOSPITAL_COMMUNITY)
Admission: EM | Admit: 2017-07-10 | Discharge: 2017-07-10 | Disposition: A | Discharge status: Home / Self Care | Payer: No Typology Code available for payment source | Attending: Family Medicine | Admitting: Family Medicine

## 2017-07-10 ENCOUNTER — Encounter (HOSPITAL_COMMUNITY): Payer: Self-pay | Admitting: Emergency Medicine

## 2017-07-10 ENCOUNTER — Ambulatory Visit (INDEPENDENT_AMBULATORY_CARE_PROVIDER_SITE_OTHER): Payer: No Typology Code available for payment source

## 2017-07-10 DIAGNOSIS — M25512 Pain in left shoulder: Secondary | ICD-10-CM | POA: Diagnosis not present

## 2017-07-10 NOTE — ED Triage Notes (Signed)
PT C/O: left arm/shoulder pain/inj onset 10 days ago... Reports she fell off bed onto hardwood floor  Pain increases w/certain movements.   TAKING MEDS: Acetaminophen   A&O x4... NAD... Ambulatory

## 2017-07-10 NOTE — Discharge Instructions (Signed)
You may use over the counter ibuprofen or acetaminophen as needed.  ° °

## 2017-07-15 NOTE — ED Provider Notes (Signed)
[facility]   [id number] 07/10/17 Arrival Time: 1059  ASSESSMENT & PLAN:  1. Acute pain of left shoulder     Imaging: Dg Shoulder Left  Result Date: 07/10/2017 CLINICAL DATA:  The patient fell out of bed 2 weeks ago landing on the left shoulder. Patient has persistent generalized pain radiating to the distal left humerus. Full range of motion. EXAM: LEFT SHOULDER - 2+ VIEW COMPARISON:  Left shoulder series dated April 19, 2004. FINDINGS: The bones are subjectively adequately mineralized. The joint spaces are well maintained. The subacromial subdeltoid space is normal. There is no acute or healing fracture. The soft tissues are unremarkable. IMPRESSION: There is no acute or significant chronic bony abnormality of the left shoulder. Electronically Signed   By: David  Martinique M.D.   On: 07/10/2017 12:03   Prefers OTC analgesics if necessary. Will f/u with PCP if not seeing improvement over the next week. May f/u here as needed.  Reviewed expectations re: course of current medical issues. Questions answered. Outlined signs and symptoms indicating need for more acute intervention. Patient verbalized understanding. After Visit Summary given.  SUBJECTIVE: History from: patient. [name] is a 62 y.o. female who reports:  Pain/Discomfort of: left shoulder Onset abrupt, 1 week ago Frequency: fairly persistent Injury/trama: yes, reports falling from her bed onto hardwood floor; "landed right on my shoulder I think" Describes as: localized aching without radiation Severity: mild Progression: stable Relieved by: not moving shoulder Worsened by: certain movements Associated symptoms: none reported Extremity sensation changes or weakness: none Self treatment: tried OTCs with relief of pain; Tylenol with mild help  History of similar: no  ROS: As per HPI.   OBJECTIVE:  Vitals:   07/10/17 1132  BP: 116/64  Pulse: 71  Resp: 20  Temp: 98.3 F (36.8 C)  TempSrc:  Oral  SpO2: 100%    General appearance: alert; no distress Extremities: no cyanosis or edema; symmetrical with no gross deformities; generalized tenderness over her left shoulder with no swelling and no bruising; ROM: normal but discomfort reported; no specific bony tenderness CV: normal extremity capillary refill Skin: warm and dry Neurologic: normal gait; normal symmetric reflexes in all extremities; normal sensation in all extremities Psychological: alert and cooperative; normal mood and affect  No Known Allergies  Past Medical History:  Diagnosis Date  . Anemia    hx  . Arthritis    "all over" (06/19/2017)  . Hyperlipidemia   . Hypertension   . TIA (transient ischemic attack)    pt unaware of this hx on 06/19/2017   Social History   Socioeconomic History  . Marital status: Widowed    Spouse name: Not on file  . Number of children: Not on file  . Years of education: Not on file  . Highest education level: Not on file  Social Needs  . Financial resource strain: Not on file  . Food insecurity - worry: Not on file  . Food insecurity - inability: Not on file  . Transportation needs - medical: Not on file  . Transportation needs - non-medical: Not on file  Occupational History  . Not on file  Tobacco Use  . Smoking status: Never Smoker  . Smokeless tobacco: Never Used  Substance and Sexual Activity  . Alcohol use: Yes    Comment: 06/19/2017  "1-2  drinks 2-3 times/month"  . Drug use: No  . Sexual activity: Yes  Other Topics Concern  . 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 Hypertension Mother   . Arthritis Mother   . Diabetes Mother   . Heart disease Father   . Diabetes Sister   . Diabetes Brother   . Diabetes Sister   . Cancer Neg Hx    Past Surgical History:  Procedure Laterality  Date  . ANTERIOR CERVICAL DECOMP/DISCECTOMY FUSION    . BACK SURGERY    . CESAREAN SECTION  1986  . IR GENERIC HISTORICAL  09/21/2014   IR ANGIO INTRA EXTRACRAN SEL INTERNAL CAROTID UNI L MOD SED 09/21/2014 [name], MD [facility]  . IR GENERIC HISTORICAL  09/21/2014   IR ANGIO INTRA EXTRACRAN SEL COM CAROTID INNOMINATE UNI R MOD SED 09/21/2014 [name], MD [facility]  . IR GENERIC HISTORICAL  09/21/2014   IR ANGIO VERTEBRAL SEL VERTEBRAL UNI L MOD SED 09/21/2014 [name], MD [facility]  . IR GENERIC HISTORICAL  09/21/2014   IR 3D INDEPENDENT WKST 09/21/2014 [name], MD [facility]  . LAPAROSCOPIC CHOLECYSTECTOMY    . TUBAL LIGATION        Vanessa Kick, MD 07/15/17 1006

## 2017-07-17 ENCOUNTER — Ambulatory Visit (INDEPENDENT_AMBULATORY_CARE_PROVIDER_SITE_OTHER): Payer: No Typology Code available for payment source | Admitting: Physician Assistant

## 2017-07-17 ENCOUNTER — Other Ambulatory Visit: Payer: Self-pay

## 2017-07-17 ENCOUNTER — Encounter (INDEPENDENT_AMBULATORY_CARE_PROVIDER_SITE_OTHER): Payer: Self-pay | Admitting: Physician Assistant

## 2017-07-17 VITALS — BP 157/91 | HR 91 | Temp 98.0°F | Wt 107.2 lb

## 2017-07-17 DIAGNOSIS — Z79899 Other long term (current) drug therapy: Secondary | ICD-10-CM | POA: Diagnosis not present

## 2017-07-17 DIAGNOSIS — H43391 Other vitreous opacities, right eye: Secondary | ICD-10-CM

## 2017-07-17 DIAGNOSIS — L02412 Cutaneous abscess of left axilla: Secondary | ICD-10-CM

## 2017-07-17 MED ORDER — CEPHALEXIN 500 MG PO TABS: 500.0000 mg | ORAL_TABLET | Freq: Three times a day (TID) | ORAL | 0 refills | Status: AC

## 2017-07-17 MED ORDER — FLUCONAZOLE 150 MG PO TABS: ORAL_TABLET | ORAL | 0 refills | Status: DC

## 2017-07-17 MED FILL — CEPHALEXIN 500 MG CAPSULE: 500 | 10 days supply | Qty: 30 | Fill #0

## 2017-07-17 MED FILL — FLUCONAZOLE 150 MG TABLET: 150 | 2 days supply | Qty: 2 | Fill #0

## 2017-07-17 NOTE — Progress Notes (Signed)
Subjective:  Patient ID: [name], female    DOB: [birth year]  Age: 62 y.o. MRN: [id number]  CC:  Axillary abscess  HPI  Marcia Ent Godfreyis a 62 y.o.femalewith a medical history of TIA, HLD, Prediabetes, HTN, and depression presents with left axillary abscess since five days ago. Started as a "small pimple" and has grown to the point that it is bothering her during her sleep. Has not taken anything for relief.     She also sees a "black dot" float across her right eye's field of vision. Has made an appointment with her ophthalmologist to be seen in the next few days.      Outpatient Medications Prior to Visit  Medication Sig Dispense Refill  . amLODipine (NORVASC) 10 MG tablet Take 1 tablet (10 mg total) by mouth daily. 90 tablet 3  . aspirin EC 81 MG tablet Take 1 tablet (81 mg total) by mouth daily. 90 tablet 3  . atorvastatin (LIPITOR) 40 MG tablet Take 1 tablet (40 mg total) daily by mouth. 90 tablet 3  . blood glucose meter kit and supplies KIT Dispense based on patient and insurance preference. Use up to twice daily as directed. (FOR ICD-9 250.00, 250.01). 1 each 0  . feeding supplement, ENSURE ENLIVE, (ENSURE ENLIVE) LIQD Take 237 mLs by mouth 3 (three) times daily between meals. 237 mL 12  . lisinopril-hydrochlorothiazide (ZESTORETIC) 10-12.5 MG tablet Take 2 tablets by mouth daily. 180 tablet 3  . metFORMIN (GLUCOPHAGE XR) 500 MG 24 hr tablet Take 1 tablet (500 mg total) by mouth daily with breakfast. 30 tablet 5  . metoprolol succinate (TOPROL-XL) 25 MG 24 hr tablet Take 2 tablets (50 mg total) by mouth daily. 180 tablet 3  . PARoxetine (PAXIL) 10 MG tablet Take 1 tablet (10 mg total) by mouth daily. 90 tablet 3   No facility-administered medications prior to visit.      ROS Review of Systems  Constitutional: Negative for chills, fever and malaise/fatigue.  Eyes: Negative for blurred vision.       Black dot in vision  Respiratory: Negative for shortness of  breath.   Cardiovascular: Negative for chest pain and palpitations.  Gastrointestinal: Negative for abdominal pain and nausea.  Genitourinary: Negative for dysuria and hematuria.  Musculoskeletal: Negative for joint pain and myalgias.  Skin: Negative for rash.       abscess  Neurological: Negative for tingling and headaches.  Psychiatric/Behavioral: Negative for depression. The patient is not nervous/anxious.     Objective:  BP (!) 157/91 (BP Location: Left Arm, Patient Position: Sitting, Cuff Size: Normal)   Pulse 91   Temp 98 F (36.7 C) (Oral)   Wt 107 lb 3.2 oz (48.6 kg)   SpO2 100%   BMI 19.61 kg/m   BP/Weight 07/17/2017 2/44/6286 08/30/1769  Systolic BP 165 790 383  Diastolic BP 91 64 83  Wt. (Lbs) 107.2 - 110.8  BMI 19.61 - 20.27      Physical Exam  Constitutional: She is oriented to person, place, and time.  Well developed, well nourished, NAD, polite  HENT:  Head: Normocephalic and atraumatic.  Eyes: No scleral icterus.  Neck: Normal range of motion. Neck supple. No thyromegaly present.  Pulmonary/Chest: Effort normal.  Neurological: She is alert and oriented to person, place, and time.  Skin: Skin is warm and dry. No rash noted. No erythema. No pallor.  Two small papules measuring approximately 1 cm each and located next to each other in the left  axilla. Seems each abscess is coming to a head. No cellulitis, streaking, suppuration, or bleeding.  Psychiatric: She has a normal mood and affect. Her behavior is normal. Thought content normal.  Vitals reviewed.    Assessment & Plan:   1. Abscess of axilla, left - Cephalexin 500 MG tablet; Take 1 tablet (500 mg total) by mouth 3 (three) times daily for 10 days.  Dispense: 30 tablet; Refill: 0 - I have advised I&D if abscess does not self drain in the next few days. Pt voiced understanding.   2. Vitreous floaters of right eye - Keep appt with ophthalmology  3. High risk medication use - fluconazole (DIFLUCAN)  150 MG tablet; Take one tablet at the beginning of your antibiotic use. Then one tablet at the completion of your antibiotic.  Dispense: 2 tablet; Refill: 0   Meds ordered this encounter  Medications  . Cephalexin 500 MG tablet    Sig: Take 1 tablet (500 mg total) by mouth 3 (three) times daily for 10 days.    Dispense:  30 tablet    Refill:  0    Order Specific Question:   Supervising Provider    Answer:   Tresa Garter [id number]  . fluconazole (DIFLUCAN) 150 MG tablet    Sig: Take one tablet at the beginning of your antibiotic use. Then one tablet at the completion of your antibiotic.    Dispense:  2 tablet    Refill:  0    Order Specific Question:   Supervising Provider    Answer:   Tresa Garter [id number]    Follow-up: Return if symptoms worsen or fail to improve.   Clent Demark PA

## 2017-07-17 NOTE — Progress Notes (Signed)
Pt complains of seeing black spots, but she does have an appt with an eye doctor to address the issue

## 2017-07-17 NOTE — Patient Instructions (Addendum)
Vitreous Detachment Vitreous detachment is part of the normal aging process in your eyes. Vitreous is the jelly-like substance that makes up most of the interior of your eyeballs. It helps them stay round. The vitreous is attached to the retina of the eye with a series of fibers. As you age, the vitreous gradually shrinks. Tension increases between the fibers and the retina. Eventually, the fibers can break free from the retina, causing vitreous detachment. What are the causes? Aging is the main cause of vitreous detachment. Everyone's vitreous naturally shrinks with age. What increases the risk? You are more likely to have vitreous detachment if you:  Are 21 years old or older.  Have inflammation of the eye.  Have an eye injury.  Have had eye surgery.  Have a hemorrhage in your eye.  Are very nearsighted (myopia).  What are the signs or symptoms? Seeing floaters is the most common symptom of vitreous detachment. Floaters occur as the vitreous begins to shrink. They may:  Appear as tiny dots or webs in your vision.  Seem to disappear when you look at them directly.  Increase in frequency as your condition gets worse.  Along with floaters, you may also notice flashes of light (photopsias) in your side vision, also called peripheral vision. These flashes may look like lightning streaks. How is this diagnosed? A health care provider can diagnose vitreous detachment based on your signs and symptoms. You may also need to see a health care provider who specializes in conditions and diseases of the eye (ophthalmologist). This specialist may do a physical exam that includes a test to check the opening in the center of your eye (pupil). For this exam, eye drops will be put in your pupils to make them wider, and the pupils will then be checked with a magnifying lens (dilated eye exam). This is the best way to determine the type and extent of damage to your eye. How is this treated? Most of the  time, vitreous detachment does not affect vision or require treatment. Sometimes vitreous detachment can cause retinal detachment. This is a serious problem that can lead to permanent vision loss. In this case, you may need eye surgery to reattach your retina (reattachment surgery) and preserve or restore your vision. Get help right away if: You develop signs of retinal detachment. These include:  A sudden increase in the number of floaters you see.  An increase in the number of flashes of light you see in your peripheral vision.  This information is not intended to replace advice given to you by your health care provider. Make sure you discuss any questions you have with your health care provider. Document Released: 06/14/2003 Document Revised: 11/17/2015 Document Reviewed: 10/07/2013 Elsevier Interactive Patient Education  2018 [employer].    Skin Abscess A skin abscess is an infected area on or under your skin that contains pus and other material. An abscess can happen almost anywhere on your body. Some abscesses break open (rupture) on their own. Most continue to get worse unless they are treated. The infection can spread deeper into the body and into your blood, which can make you feel sick. Treatment usually involves draining the abscess. Follow these instructions at home: Abscess Care  If you have an abscess that has not drained, place a warm, clean, wet washcloth over the abscess several times a day. Do this as told by your doctor.  Follow instructions from your doctor about how to take care of your abscess. Make  sure you: ? Cover the abscess with a bandage (dressing). ? Change your bandage or gauze as told by your doctor. ? Wash your hands with soap and water before you change the bandage or gauze. If you cannot use soap and water, use hand sanitizer.  Check your abscess every day for signs that the infection is getting worse. Check for: ? More redness, swelling, or  pain. ? More fluid or blood. ? Warmth. ? More pus or a bad smell. Medicines   Take over-the-counter and prescription medicines only as told by your doctor.  If you were prescribed an antibiotic medicine, take it as told by your doctor. Do not stop taking the antibiotic even if you start to feel better. General instructions  To avoid spreading the infection: ? Do not share personal care items, towels, or hot tubs with others. ? Avoid making skin-to-skin contact with other people.  Keep all follow-up visits as told by your doctor. This is important. Contact a doctor if:  You have more redness, swelling, or pain around your abscess.  You have more fluid or blood coming from your abscess.  Your abscess feels warm when you touch it.  You have more pus or a bad smell coming from your abscess.  You have a fever.  Your muscles ache.  You have chills.  You feel sick. Get help right away if:  You have very bad (severe) pain.  You see red streaks on your skin spreading away from the abscess. This information is not intended to replace advice given to you by your health care provider. Make sure you discuss any questions you have with your health care provider. Document Released: 11/28/2007 Document Revised: 02/05/2016 Document Reviewed: 04/20/2015 Elsevier Interactive Patient Education  [employer].

## 2017-07-18 ENCOUNTER — Ambulatory Visit (INDEPENDENT_AMBULATORY_CARE_PROVIDER_SITE_OTHER): Payer: No Typology Code available for payment source | Admitting: Physician Assistant

## 2017-07-25 LAB — HM DIABETES EYE EXAM

## 2017-08-20 ENCOUNTER — Other Ambulatory Visit (INDEPENDENT_AMBULATORY_CARE_PROVIDER_SITE_OTHER): Payer: Self-pay | Admitting: Physician Assistant

## 2017-08-27 NOTE — Telephone Encounter (Signed)
FWD to PCP. Tempestt S Roberts, CMA  

## 2017-09-30 ENCOUNTER — Ambulatory Visit (INDEPENDENT_AMBULATORY_CARE_PROVIDER_SITE_OTHER): Payer: Self-pay | Admitting: Physician Assistant

## 2017-12-04 ENCOUNTER — Emergency Department (HOSPITAL_COMMUNITY): Payer: Self-pay

## 2017-12-04 ENCOUNTER — Encounter (HOSPITAL_COMMUNITY): Payer: Self-pay | Admitting: Emergency Medicine

## 2017-12-04 ENCOUNTER — Emergency Department (HOSPITAL_COMMUNITY)
Admission: EM | Admit: 2017-12-04 | Discharge: 2017-12-04 | Disposition: A | Discharge status: Home / Self Care | Payer: Self-pay | Attending: Emergency Medicine | Admitting: Emergency Medicine

## 2017-12-04 DIAGNOSIS — Z7982 Long term (current) use of aspirin: Secondary | ICD-10-CM | POA: Insufficient documentation

## 2017-12-04 DIAGNOSIS — Z7984 Long term (current) use of oral hypoglycemic drugs: Secondary | ICD-10-CM | POA: Insufficient documentation

## 2017-12-04 DIAGNOSIS — E860 Dehydration: Secondary | ICD-10-CM

## 2017-12-04 DIAGNOSIS — I10 Essential (primary) hypertension: Secondary | ICD-10-CM | POA: Insufficient documentation

## 2017-12-04 DIAGNOSIS — R42 Dizziness and giddiness: Secondary | ICD-10-CM

## 2017-12-04 DIAGNOSIS — R112 Nausea with vomiting, unspecified: Secondary | ICD-10-CM

## 2017-12-04 DIAGNOSIS — R197 Diarrhea, unspecified: Secondary | ICD-10-CM

## 2017-12-04 DIAGNOSIS — Z79899 Other long term (current) drug therapy: Secondary | ICD-10-CM | POA: Insufficient documentation

## 2017-12-04 DIAGNOSIS — R531 Weakness: Secondary | ICD-10-CM

## 2017-12-04 DIAGNOSIS — R109 Unspecified abdominal pain: Secondary | ICD-10-CM | POA: Insufficient documentation

## 2017-12-04 LAB — CBC
HCT: 46.4 % — ABNORMAL HIGH (ref 36.0–46.0)
Hemoglobin: 15.5 g/dL — ABNORMAL HIGH (ref 12.0–15.0)
MCH: 28.5 pg (ref 26.0–34.0)
MCHC: 33.4 g/dL (ref 30.0–36.0)
MCV: 85.3 fL (ref 78.0–100.0)
Platelets: 341 10*3/uL (ref 150–400)
RBC: 5.44 MIL/uL — ABNORMAL HIGH (ref 3.87–5.11)
RDW: 13.5 % (ref 11.5–15.5)
WBC: 11.8 10*3/uL — ABNORMAL HIGH (ref 4.0–10.5)

## 2017-12-04 LAB — COMPREHENSIVE METABOLIC PANEL
ALT: 19 U/L (ref 14–54)
AST: 21 U/L (ref 15–41)
Albumin: 5.3 g/dL — ABNORMAL HIGH (ref 3.5–5.0)
Alkaline Phosphatase: 96 U/L (ref 38–126)
Anion gap: 11 (ref 5–15)
BUN: 21 mg/dL — ABNORMAL HIGH (ref 6–20)
CO2: 29 mmol/L (ref 22–32)
Calcium: 10.1 mg/dL (ref 8.9–10.3)
Chloride: 99 mmol/L — ABNORMAL LOW (ref 101–111)
Creatinine, Ser: 0.72 mg/dL (ref 0.44–1.00)
GFR calc Af Amer: >60 mL/min (ref >60)
GFR calc non Af Amer: >60 mL/min (ref >60)
Glucose, Bld: 102 mg/dL — ABNORMAL HIGH (ref 65–99)
Potassium: 3.3 mmol/L — ABNORMAL LOW (ref 3.5–5.1)
Sodium: 139 mmol/L (ref 135–145)
Total Bilirubin: 0.8 mg/dL (ref 0.3–1.2)
Total Protein: 9.2 g/dL — ABNORMAL HIGH (ref 6.5–8.1)

## 2017-12-04 LAB — URINALYSIS, ROUTINE W REFLEX MICROSCOPIC
Bilirubin Urine: NEGATIVE
Glucose, UA: NEGATIVE mg/dL
Hgb urine dipstick: NEGATIVE
Ketones, ur: NEGATIVE mg/dL
Leukocytes, UA: NEGATIVE
Nitrite: NEGATIVE
Protein, ur: NEGATIVE mg/dL
Specific Gravity, Urine: 1.016 (ref 1.005–1.030)
pH: 6 (ref 5.0–8.0)

## 2017-12-04 LAB — LIPASE, BLOOD: Lipase: 29 U/L (ref 11–51)

## 2017-12-04 MED ORDER — IOPAMIDOL (ISOVUE-370) INJECTION 76%
100.0000 mL | Freq: Once | INTRAVENOUS | Status: AC | PRN
  Administered 2017-12-04: 100 mL via INTRAVENOUS

## 2017-12-04 MED ORDER — LOPERAMIDE HCL 2 MG PO TABS: 2.0000 mg | ORAL_TABLET | Freq: Four times a day (QID) | ORAL | 0 refills | Status: DC | PRN

## 2017-12-04 MED ORDER — SODIUM CHLORIDE 0.9 % IV SOLN
INTRAVENOUS | Status: DC
  Administered 2017-12-04: 16:00:00 via INTRAVENOUS

## 2017-12-04 MED ORDER — IOPAMIDOL (ISOVUE-370) INJECTION 76%
INTRAVENOUS | Status: AC
  Filled 2017-12-04: qty 100

## 2017-12-04 MED ORDER — SODIUM CHLORIDE 0.9 % IV BOLUS
500.0000 mL | Freq: Once | INTRAVENOUS | Status: AC
  Administered 2017-12-04: 500 mL via INTRAVENOUS

## 2017-12-04 MED ORDER — ONDANSETRON HCL 4 MG/2ML IJ SOLN
4.0000 mg | Freq: Once | INTRAMUSCULAR | Status: AC
  Administered 2017-12-04: 4 mg via INTRAVENOUS
  Filled 2017-12-04: qty 2

## 2017-12-04 MED ORDER — PROMETHAZINE HCL 25 MG PO TABS: 25.0000 mg | ORAL_TABLET | Freq: Four times a day (QID) | ORAL | 1 refills | Status: DC | PRN

## 2017-12-04 NOTE — ED Provider Notes (Addendum)
[facility] DEPT Provider Note   CSN: [id number] Arrival date & time: 12/04/17  1345     History   Chief Complaint Chief Complaint  Patient presents with  . off balance  . Dizziness  . Emesis    HPI [name] is a 62 y.o. female.  Patient brought in by self.  Patient with a complaint of nausea vomiting and diarrhea loose bowel movements about 4 times today averaging about 3-4 a day for the last week.  No blood with bowel movements or vomiting.  This been feeling off balance since last week.  No headaches.  Patient has a known brain aneurysm that has not been evaluated for a while.  It was diagnosed 2 years ago.  Patient denies fever dysuria abdominal pain back pain or headache.     Past Medical History:  Diagnosis Date  . Anemia    hx  . Arthritis    "all over" (06/19/2017)  . Hyperlipidemia   . Hypertension   . TIA (transient ischemic attack)    pt unaware of this hx on 06/19/2017    Patient Active Problem List   Diagnosis Date Noted  . Enteritis 06/19/2017  . Abdominal pain 06/18/2017  . Hyperlipidemia 06/18/2017  . Elevated glucose 06/18/2017  . Need for hepatitis C screening test 05/23/2017  . Breast pain, left 01/05/2016  . Left ankle sprain 01/20/2015  . Gastritis 11/08/2014  . Malaise 10/14/2014  . Hypokalemia 10/14/2014  . Essential hypertension   . Bilateral wrist pain 10/06/2014  . Hot flashes, menopausal 10/06/2014  . Cerebral aneurysm without rupture 08/20/2014  . TIA (transient ischemic attack) 08/19/2014  . Accelerated hypertension 02/24/2013  . Depression 02/24/2013    Past Surgical History:  Procedure Laterality Date  . ANTERIOR CERVICAL DECOMP/DISCECTOMY FUSION    . BACK SURGERY    . CESAREAN SECTION  1986  . IR GENERIC HISTORICAL  09/21/2014   IR ANGIO INTRA EXTRACRAN SEL INTERNAL CAROTID UNI L MOD SED 09/21/2014 [name], MD [facility]  . IR GENERIC HISTORICAL  09/21/2014   IR ANGIO  INTRA EXTRACRAN SEL COM CAROTID INNOMINATE UNI R MOD SED 09/21/2014 [name], MD [facility]  . IR GENERIC HISTORICAL  09/21/2014   IR ANGIO VERTEBRAL SEL VERTEBRAL UNI L MOD SED 09/21/2014 [name], MD [facility]  . IR GENERIC HISTORICAL  09/21/2014   IR 3D INDEPENDENT WKST 09/21/2014 [name], MD [facility]  . LAPAROSCOPIC CHOLECYSTECTOMY    . TUBAL LIGATION       OB History    Gravida  2   Para  1   Term  1   Preterm      AB  1   Living  1     SAB  1   TAB      Ectopic      Multiple      Live Births  1            Home Medications    Prior to Admission medications   Medication Sig Start Date End Date Taking? Authorizing Provider  amLODipine (NORVASC) 10 MG tablet Take 1 tablet (10 mg total) by mouth daily. 07/01/17  Yes Clent Demark, PA-C  aspirin EC 81 MG tablet Take 1 tablet (81 mg total) by mouth daily. 02/01/16  Yes Funches, Josalyn, MD  atorvastatin (LIPITOR) 40 MG tablet Take 1 tablet (40 mg total) daily by mouth. 04/29/17  Yes Clent Demark, PA-C  feeding supplement,  ENSURE ENLIVE, (ENSURE ENLIVE) LIQD Take 237 mLs by mouth 3 (three) times daily between meals. 07/01/17  Yes Clent Demark, PA-C  fluconazole (DIFLUCAN) 150 MG tablet Take one tablet at the beginning of your antibiotic use. Then one tablet at the completion of your antibiotic. 07/17/17  Yes Clent Demark, PA-C  lisinopril-hydrochlorothiazide (ZESTORETIC) 10-12.5 MG tablet Take 2 tablets by mouth daily. 07/01/17  Yes Clent Demark, PA-C  metFORMIN (GLUCOPHAGE XR) 500 MG 24 hr tablet Take 1 tablet (500 mg total) by mouth daily with breakfast. 07/01/17  Yes Clent Demark, PA-C  metoprolol succinate (TOPROL-XL) 25 MG 24 hr tablet Take 2 tablets (50 mg total) by mouth daily. 07/01/17  Yes Clent Demark, PA-C  PARoxetine (PAXIL) 10 MG tablet Take 1 tablet (10 mg total) by mouth daily. 07/01/17  Yes Clent Demark, PA-C  ACCU-CHEK GUIDE test strip  USE UP TO TWICE DAILY AS DIRECTED 08/30/17   Clent Demark, PA-C  blood glucose meter kit and supplies KIT Dispense based on patient and insurance preference. Use up to twice daily as directed. (FOR ICD-9 250.00, 250.01). 07/01/17   Clent Demark, PA-C    Family History Family History  Problem Relation Age of Onset  . Hypertension Mother   . Arthritis Mother   . Diabetes Mother   . Heart disease Father   . Diabetes Sister   . Diabetes Brother   . Diabetes Sister   . Cancer Neg Hx     Social History Social History   Tobacco Use  . Smoking status: Never Smoker  . Smokeless tobacco: Never Used  Substance Use Topics  . Alcohol use: Yes  . Drug use: No     Allergies   Patient has no known allergies.   Review of Systems Review of Systems  Constitutional: Negative for fever.  HENT: Negative for congestion.   Eyes: Negative for visual disturbance.  Respiratory: Negative for shortness of breath.   Gastrointestinal: Positive for diarrhea, nausea and vomiting. Negative for abdominal pain.  Genitourinary: Negative for dysuria.  Musculoskeletal: Negative for back pain and neck pain.  Skin: Negative for rash.  Neurological: Positive for dizziness and light-headedness. Negative for speech difficulty, weakness and headaches.  Hematological: Does not bruise/bleed easily.  Psychiatric/Behavioral: Negative for confusion.     Physical Exam Updated Vital Signs BP (!) 162/91   Pulse 73   Temp 98.5 F (36.9 C) (Oral)   Resp 18   Ht 1.575 m (5' 2")   Wt 48.3 kg (106 lb 6 oz)   SpO2 100%   BMI 19.46 kg/m   Physical Exam  Constitutional: She is oriented to person, place, and time. She appears well-developed and well-nourished. No distress.  HENT:  Head: Normocephalic and atraumatic.  Right Ear: External ear normal.  Mouth/Throat: Oropharynx is clear and moist.  Eyes: Pupils are equal, round, and reactive to light. EOM are normal.  Neck: Normal range of motion. Neck  supple.  Cardiovascular: Normal rate, regular rhythm and normal heart sounds.  Pulmonary/Chest: Effort normal and breath sounds normal. No respiratory distress.  Abdominal: Soft. Bowel sounds are normal. There is no tenderness.  Musculoskeletal: Normal range of motion. She exhibits no edema.  Neurological: She is alert and oriented to person, place, and time. No cranial nerve deficit or sensory deficit. She exhibits normal muscle tone. Coordination normal.  Except for some mild left lower extremity weakness.  No upper extremity weakness.  Skin: Skin is warm. Capillary refill takes  less than 2 seconds. No rash noted.  Nursing note and vitals reviewed.    ED Treatments / Results  Labs (all labs ordered are listed, but only abnormal results are displayed) Labs Reviewed  COMPREHENSIVE METABOLIC PANEL - Abnormal; Notable for the following components:      Result Value   Potassium 3.3 (*)    Chloride 99 (*)    Glucose, Bld 102 (*)    BUN 21 (*)    Total Protein 9.2 (*)    Albumin 5.3 (*)    All other components within normal limits  CBC - Abnormal; Notable for the following components:   WBC 11.8 (*)    RBC 5.44 (*)    Hemoglobin 15.5 (*)    HCT 46.4 (*)    All other components within normal limits  LIPASE, BLOOD  URINALYSIS, ROUTINE W REFLEX MICROSCOPIC    EKG None  Radiology Ct Angio Head W/cm &/or Wo Cm  Result Date: 12/04/2017 CLINICAL DATA:  Lightheadedness, imbalance, nausea, and vomiting for a couple of weeks. History of cerebral aneurysm. EXAM: CT ANGIOGRAPHY HEAD TECHNIQUE: Multidetector CT imaging of the head was performed using the standard protocol during bolus administration of intravenous contrast. Multiplanar CT image reconstructions and MIPs were obtained to evaluate the vascular anatomy. CONTRAST:  100 mL Isovue 370 COMPARISON:  Head CT 10/12/2015. Head MRI 08/19/2014 and MRA 08/20/2014. FINDINGS: CT HEAD Brain: There is no evidence of acute infarct, intracranial  hemorrhage, mass, midline shift, or extra-axial fluid collection. Mild asymmetry of the lateral ventricles is unchanged and likely developmental. Periventricular white matter hypodensities are similar to the prior CT and nonspecific but compatible with mild chronic small vessel ischemic disease. Vascular: No hyperdense vessel. Skull: No fracture or focal osseous lesion. Sinuses: Left frontoethmoid osteoma. No acute inflammatory sinus disease. Clear mastoid air cells. Orbits: Unremarkable. CTA HEAD Anterior circulation: The internal carotid arteries are patent from skull base to carotid termini with minimal nonstenotic atherosclerotic plaque bilaterally. The 3-4 mm inferomedially projecting left paraophthalmic ICA aneurysm has not significantly changed in size. ACAs and MCAs are patent without evidence of proximal branch occlusion or significant stenosis. Posterior circulation: The visualized distal vertebral arteries are widely patent to the basilar. Patent PICA, AICA, and SCA origins are visualized bilaterally. The basilar artery is widely patent. There is a small right posterior communicating artery. The PCAs are patent without evidence of significant stenosis. No aneurysm is identified. Venous sinuses: As permitted by contrast timing, patent. Diminutive left transverse and sigmoid sinuses. Anatomic variants: None of significance. Delayed phase: No abnormal enhancement. IMPRESSION: 1. No evidence of acute intracranial abnormality. 2. Patent circle of Willis without major branch occlusion or significant stenosis. 3. 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 Electronically Signed   By: Logan Bores M.D.   On: 12/04/2017 17:22   Dg Chest 2 View  Result Date: 12/04/2017 CLINICAL DATA:  Lightheaded for a couple weeks, balance, nausea, vomiting, history of brain aneurysm, hypertension EXAM: CHEST - 2 VIEW COMPARISON:  06/18/2017 FINDINGS: Normal heart size, mediastinal contours, and pulmonary vascularity.  Lungs clear. No pleural effusion or pneumothorax. Prior cervical spine fusion. IMPRESSION: No acute abnormalities. Electronically Signed   By: Lavonia Dana M.D.   On: 12/04/2017 17:01   Ct Abdomen Pelvis W Contrast  Result Date: 12/04/2017 CLINICAL DATA:  Abdominal fullness, nausea/vomiting EXAM: CT ABDOMEN AND PELVIS WITH CONTRAST TECHNIQUE: Multidetector CT imaging of the abdomen and pelvis was performed using the standard protocol following bolus administration of intravenous contrast.  CONTRAST:  100 mL ISOVUE-370 IOPAMIDOL (ISOVUE-370) INJECTION 76% COMPARISON:  CT abdomen/pelvis dated 06/18/2017 FINDINGS: Lower chest: Lung bases are clear. Hepatobiliary: Liver is within normal limits. Status post cholecystectomy. Mild central intrahepatic ductal dilatation, chronic/postsurgical. Common duct measures 7 mm, within normal limits. Pancreas: Within normal limits. Spleen: Within normal limits. Adrenals/Urinary Tract: Adrenal glands are within normal limits. Subcentimeter bilateral renal cysts. No hydronephrosis. Bladder is within normal limits. Stomach/Bowel: Stomach is within normal limits. No evidence of bowel obstruction. Appendix is not discretely visualized. Sigmoid diverticulosis, without evidence of diverticulitis. Vascular/Lymphatic: No evidence of abdominal aortic aneurysm. Atherosclerotic calcifications of the abdominal aorta and branch vessels. No suspicious abdominopelvic lymphadenopathy. Reproductive: Uterus is grossly unremarkable. No adnexal masses. Other: No abdominopelvic ascites. Postsurgical changes along the midline anterior abdominal wall. Musculoskeletal: Degenerative changes of the visualized thoracolumbar spine. IMPRESSION: No evidence of bowel obstruction. Sigmoid diverticulosis, without evidence of diverticulitis. Status post cholecystectomy. Additional ancillary findings as above. Electronically Signed   By: Julian Hy M.D.   On: 12/04/2017 17:18    Procedures Procedures  (including critical care time)  Medications Ordered in ED Medications  0.9 %  sodium chloride infusion ( Intravenous New Bag/Given 12/04/17 1620)  sodium chloride 0.9 % bolus 500 mL (500 mLs Intravenous New Bag/Given 12/04/17 1619)  ondansetron (ZOFRAN) injection 4 mg (4 mg Intravenous Given 12/04/17 1619)  iopamidol (ISOVUE-370) 76 % injection 100 mL (100 mLs Intravenous Contrast Given 12/04/17 1630)     Initial Impression / Assessment and Plan / ED Course  I have reviewed the triage vital signs and the nursing notes.  Pertinent labs & imaging results that were available during my care of the patient were reviewed by me and considered in my medical decision making (see chart for details).     Patient's initial work-up to include CTA of head without any acute findings.  Pre-existing aneurysm without any change from previous exam.  Chest x-ray negative abdominal CT without any acute findings.  Labs consistent with some dehydration.  Patient will benefit from hydration.  Offered MRI for further evaluation vertically and face of the mild left lower extremity weakness.  Patient refusing.  Patient's symptoms have apparently been present for 1 week she understands that stroke is not completely ruled out.  Patient's nausea vomiting and diarrhea can be treated symptomatically with Phenergan and Imodium AD  Final Clinical Impressions(s) / ED Diagnoses   Final diagnoses:  Dehydration  Nausea vomiting and diarrhea  Dizziness    ED Discharge Orders    None       Fredia Sorrow, MD 12/04/17 1744  Addendum: Patient still with some left lower extremity weakness compared to right.  And now a re-discussion with patient she has agreed to do MRI so it has been ordered.  These findings have been present for greater than a week or does not fit acute stroke protocol.  However determining whether she has had a stroke or not as important.    Fredia Sorrow, MD 12/04/17 1749   Addendum: MRI  without evidence of any acute stroke. Patient stable for discharge we will treat the nausea.   Fredia Sorrow, MD 12/04/17 [phone]

## 2017-12-04 NOTE — Discharge Instructions (Signed)
Work-up without any acute findings.  Take the Phenergan as needed for the nausea and vomiting.  And take the Imodium as needed.

## 2017-12-04 NOTE — ED Notes (Signed)
Patient transported to MRI 

## 2017-12-04 NOTE — ED Triage Notes (Signed)
Pt reports for couple weeks had lightheadedness, being off balance, having n/v. Reports that she has brain aneurysm.

## 2017-12-04 NOTE — ED Notes (Addendum)
Spoke with Dr. Rogene Houston about pts blood pressure. States he is aware and is okay discharging her.

## 2018-01-13 ENCOUNTER — Ambulatory Visit (INDEPENDENT_AMBULATORY_CARE_PROVIDER_SITE_OTHER): Payer: No Typology Code available for payment source | Admitting: Physician Assistant

## 2018-05-17 ENCOUNTER — Observation Stay (HOSPITAL_COMMUNITY)
Admission: EM | Admit: 2018-05-17 | Discharge: 2018-05-18 | Disposition: A | Discharge status: Home / Self Care | Payer: No Typology Code available for payment source | Attending: Internal Medicine | Admitting: Internal Medicine

## 2018-05-17 ENCOUNTER — Other Ambulatory Visit: Payer: Self-pay

## 2018-05-17 ENCOUNTER — Emergency Department (HOSPITAL_COMMUNITY): Payer: No Typology Code available for payment source

## 2018-05-17 ENCOUNTER — Observation Stay (HOSPITAL_BASED_OUTPATIENT_CLINIC_OR_DEPARTMENT_OTHER): Payer: No Typology Code available for payment source

## 2018-05-17 ENCOUNTER — Encounter (HOSPITAL_COMMUNITY): Payer: Self-pay

## 2018-05-17 DIAGNOSIS — R918 Other nonspecific abnormal finding of lung field: Secondary | ICD-10-CM | POA: Insufficient documentation

## 2018-05-17 DIAGNOSIS — Z981 Arthrodesis status: Secondary | ICD-10-CM | POA: Insufficient documentation

## 2018-05-17 DIAGNOSIS — E119 Type 2 diabetes mellitus without complications: Secondary | ICD-10-CM

## 2018-05-17 DIAGNOSIS — E785 Hyperlipidemia, unspecified: Secondary | ICD-10-CM | POA: Insufficient documentation

## 2018-05-17 DIAGNOSIS — Z8261 Family history of arthritis: Secondary | ICD-10-CM | POA: Insufficient documentation

## 2018-05-17 DIAGNOSIS — Z794 Long term (current) use of insulin: Secondary | ICD-10-CM | POA: Insufficient documentation

## 2018-05-17 DIAGNOSIS — R9431 Abnormal electrocardiogram [ECG] [EKG]: Secondary | ICD-10-CM | POA: Diagnosis not present

## 2018-05-17 DIAGNOSIS — Z8673 Personal history of transient ischemic attack (TIA), and cerebral infarction without residual deficits: Secondary | ICD-10-CM | POA: Insufficient documentation

## 2018-05-17 DIAGNOSIS — E876 Hypokalemia: Secondary | ICD-10-CM | POA: Diagnosis not present

## 2018-05-17 DIAGNOSIS — D649 Anemia, unspecified: Secondary | ICD-10-CM | POA: Insufficient documentation

## 2018-05-17 DIAGNOSIS — M1990 Unspecified osteoarthritis, unspecified site: Secondary | ICD-10-CM | POA: Insufficient documentation

## 2018-05-17 DIAGNOSIS — I10 Essential (primary) hypertension: Secondary | ICD-10-CM

## 2018-05-17 DIAGNOSIS — D72829 Elevated white blood cell count, unspecified: Secondary | ICD-10-CM

## 2018-05-17 DIAGNOSIS — Z833 Family history of diabetes mellitus: Secondary | ICD-10-CM | POA: Insufficient documentation

## 2018-05-17 DIAGNOSIS — Z8249 Family history of ischemic heart disease and other diseases of the circulatory system: Secondary | ICD-10-CM | POA: Insufficient documentation

## 2018-05-17 DIAGNOSIS — K5730 Diverticulosis of large intestine without perforation or abscess without bleeding: Secondary | ICD-10-CM | POA: Insufficient documentation

## 2018-05-17 DIAGNOSIS — Z9049 Acquired absence of other specified parts of digestive tract: Secondary | ICD-10-CM | POA: Insufficient documentation

## 2018-05-17 DIAGNOSIS — R112 Nausea with vomiting, unspecified: Secondary | ICD-10-CM

## 2018-05-17 DIAGNOSIS — J9811 Atelectasis: Secondary | ICD-10-CM | POA: Insufficient documentation

## 2018-05-17 DIAGNOSIS — R197 Diarrhea, unspecified: Secondary | ICD-10-CM

## 2018-05-17 DIAGNOSIS — Z79899 Other long term (current) drug therapy: Secondary | ICD-10-CM | POA: Insufficient documentation

## 2018-05-17 DIAGNOSIS — R05 Cough: Secondary | ICD-10-CM | POA: Insufficient documentation

## 2018-05-17 DIAGNOSIS — K529 Noninfective gastroenteritis and colitis, unspecified: Principal | ICD-10-CM | POA: Insufficient documentation

## 2018-05-17 DIAGNOSIS — I708 Atherosclerosis of other arteries: Secondary | ICD-10-CM | POA: Insufficient documentation

## 2018-05-17 DIAGNOSIS — K449 Diaphragmatic hernia without obstruction or gangrene: Secondary | ICD-10-CM | POA: Insufficient documentation

## 2018-05-17 DIAGNOSIS — Z7982 Long term (current) use of aspirin: Secondary | ICD-10-CM | POA: Insufficient documentation

## 2018-05-17 DIAGNOSIS — I158 Other secondary hypertension: Secondary | ICD-10-CM | POA: Insufficient documentation

## 2018-05-17 DIAGNOSIS — Z7984 Long term (current) use of oral hypoglycemic drugs: Secondary | ICD-10-CM | POA: Insufficient documentation

## 2018-05-17 HISTORY — DX: Type 2 diabetes mellitus without complications: E11.9

## 2018-05-17 LAB — RESPIRATORY PANEL BY PCR

## 2018-05-17 LAB — CBC WITH DIFFERENTIAL/PLATELET
Abs Immature Granulocytes: 0.13 10*3/uL — ABNORMAL HIGH (ref 0.00–0.07)
Basophils Absolute: 0.1 10*3/uL (ref 0.0–0.1)
Basophils Relative: 0 %
Eosinophils Absolute: 0 10*3/uL (ref 0.0–0.5)
Eosinophils Relative: 0 %
HCT: 45.6 % (ref 36.0–46.0)
Hemoglobin: 13.8 g/dL (ref 12.0–15.0)
Immature Granulocytes: 1 %
Lymphocytes Relative: 9 %
Lymphs Abs: 1.7 10*3/uL (ref 0.7–4.0)
MCH: 26.8 pg (ref 26.0–34.0)
MCHC: 30.3 g/dL (ref 30.0–36.0)
MCV: 88.7 fL (ref 80.0–100.0)
Monocytes Absolute: 0.4 10*3/uL (ref 0.1–1.0)
Monocytes Relative: 2 %
Neutro Abs: 17.4 10*3/uL — ABNORMAL HIGH (ref 1.7–7.7)
Neutrophils Relative %: 88 %
Platelets: 431 10*3/uL — ABNORMAL HIGH (ref 150–400)
RBC: 5.14 MIL/uL — ABNORMAL HIGH (ref 3.87–5.11)
RDW: 12.8 % (ref 11.5–15.5)
WBC: 19.8 10*3/uL — ABNORMAL HIGH (ref 4.0–10.5)
nRBC: 0 % (ref 0.0–0.2)

## 2018-05-17 LAB — MAGNESIUM: Magnesium: 1.9 mg/dL (ref 1.7–2.4)

## 2018-05-17 LAB — COMPREHENSIVE METABOLIC PANEL
ALT: 15 U/L (ref 0–44)
AST: 23 U/L (ref 15–41)
Albumin: 4.6 g/dL (ref 3.5–5.0)
Alkaline Phosphatase: 92 U/L (ref 38–126)
Anion gap: 15 (ref 5–15)
BUN: 16 mg/dL (ref 8–23)
CO2: 22 mmol/L (ref 22–32)
Calcium: 9.8 mg/dL (ref 8.9–10.3)
Chloride: 101 mmol/L (ref 98–111)
Creatinine, Ser: 0.77 mg/dL (ref 0.44–1.00)
GFR calc Af Amer: >60 mL/min (ref >60)
GFR calc non Af Amer: >60 mL/min (ref >60)
Glucose, Bld: 255 mg/dL — ABNORMAL HIGH (ref 70–99)
Potassium: 2.7 mmol/L — CL (ref 3.5–5.1)
Sodium: 138 mmol/L (ref 135–145)
Total Bilirubin: 0.5 mg/dL (ref 0.3–1.2)
Total Protein: 8.5 g/dL — ABNORMAL HIGH (ref 6.5–8.1)

## 2018-05-17 LAB — URINALYSIS, ROUTINE W REFLEX MICROSCOPIC
Bilirubin Urine: NEGATIVE
Glucose, UA: >=500 mg/dL — AB
Ketones, ur: 5 mg/dL — AB
Leukocytes, UA: NEGATIVE
Nitrite: NEGATIVE
Protein, ur: 30 mg/dL — AB
Specific Gravity, Urine: 1.022 (ref 1.005–1.030)
pH: 7 (ref 5.0–8.0)

## 2018-05-17 LAB — LIPASE, BLOOD: Lipase: 48 U/L (ref 11–51)

## 2018-05-17 LAB — I-STAT TROPONIN, ED: Troponin i, poc: 0.01 ng/mL (ref 0.00–0.08)

## 2018-05-17 LAB — TSH: TSH: 1.579 u[IU]/mL (ref 0.350–4.500)

## 2018-05-17 LAB — TROPONIN I
Troponin I: <0.03 ng/mL (ref <0.03)
Troponin I: <0.03 ng/mL (ref <0.03)

## 2018-05-17 LAB — HEMOGLOBIN A1C
Hgb A1c MFr Bld: 5.9 % — ABNORMAL HIGH (ref 4.8–5.6)
Mean Plasma Glucose: 122.63 mg/dL

## 2018-05-17 LAB — POTASSIUM: Potassium: 3.5 mmol/L (ref 3.5–5.1)

## 2018-05-17 MED ORDER — ONDANSETRON HCL 4 MG/2ML IJ SOLN: 4.0000 mg | Freq: Four times a day (QID) | INTRAMUSCULAR | Status: DC | PRN

## 2018-05-17 MED ORDER — METOPROLOL SUCCINATE ER 50 MG PO TB24: 50.0000 mg | ORAL_TABLET | Freq: Every day | ORAL | Status: DC

## 2018-05-17 MED ORDER — PAROXETINE HCL 10 MG PO TABS
10.0000 mg | ORAL_TABLET | Freq: Every day | ORAL | Status: DC
  Administered 2018-05-18: 10 mg via ORAL
  Filled 2018-05-17 (×2): qty 1

## 2018-05-17 MED ORDER — METOCLOPRAMIDE HCL 5 MG/ML IJ SOLN: 5.0000 mg | Freq: Four times a day (QID) | INTRAMUSCULAR | Status: DC | PRN

## 2018-05-17 MED ORDER — SODIUM CHLORIDE 0.9 % IV SOLN
1.5000 g | Freq: Four times a day (QID) | INTRAVENOUS | Status: DC
  Administered 2018-05-17 – 2018-05-18 (×4): 1.5 g via INTRAVENOUS
  Filled 2018-05-17 (×6): qty 1.5

## 2018-05-17 MED ORDER — AMLODIPINE BESYLATE 10 MG PO TABS
10.0000 mg | ORAL_TABLET | Freq: Every day | ORAL | Status: DC
  Administered 2018-05-18: 10 mg via ORAL
  Filled 2018-05-17: qty 1

## 2018-05-17 MED ORDER — AMLODIPINE BESYLATE 5 MG PO TABS: 10.0000 mg | ORAL_TABLET | Freq: Every day | ORAL | Status: DC

## 2018-05-17 MED ORDER — HYDRALAZINE HCL 20 MG/ML IJ SOLN
10.0000 mg | Freq: Once | INTRAMUSCULAR | Status: AC
  Administered 2018-05-17: 10 mg via INTRAVENOUS
  Filled 2018-05-17: qty 1

## 2018-05-17 MED ORDER — SODIUM CHLORIDE 0.45 % IV SOLN
INTRAVENOUS | Status: DC
  Administered 2018-05-17 – 2018-05-18 (×3): via INTRAVENOUS
  Filled 2018-05-17 (×3): qty 1000

## 2018-05-17 MED ORDER — ONDANSETRON HCL 4 MG/2ML IJ SOLN
4.0000 mg | Freq: Once | INTRAMUSCULAR | Status: AC
Start: 2018-05-17 — End: 2018-05-17
  Administered 2018-05-17: 4 mg via INTRAVENOUS
  Filled 2018-05-17: qty 2

## 2018-05-17 MED ORDER — ENALAPRILAT 1.25 MG/ML IV SOLN
1.2500 mg | Freq: Four times a day (QID) | INTRAVENOUS | Status: DC
  Administered 2018-05-17 – 2018-05-18 (×4): 1.25 mg via INTRAVENOUS
  Filled 2018-05-17 (×5): qty 1

## 2018-05-17 MED ORDER — CLONIDINE HCL 0.1 MG/24HR TD PTWK
0.1000 mg | MEDICATED_PATCH | TRANSDERMAL | Status: DC
  Administered 2018-05-17: 0.1 mg via TRANSDERMAL
  Filled 2018-05-17: qty 1

## 2018-05-17 MED ORDER — SODIUM CHLORIDE 0.9 % IV BOLUS (SEPSIS)
1000.0000 mL | Freq: Once | INTRAVENOUS | Status: AC
  Administered 2018-05-17: 1000 mL via INTRAVENOUS

## 2018-05-17 MED ORDER — PROMETHAZINE HCL 25 MG/ML IJ SOLN: 12.5000 mg | Freq: Four times a day (QID) | INTRAMUSCULAR | Status: DC | PRN

## 2018-05-17 MED ORDER — HYDRALAZINE HCL 20 MG/ML IJ SOLN
10.0000 mg | INTRAMUSCULAR | Status: DC | PRN
  Administered 2018-05-17: 10 mg via INTRAVENOUS
  Filled 2018-05-17: qty 1

## 2018-05-17 MED ORDER — PANTOPRAZOLE SODIUM 40 MG IV SOLR
40.0000 mg | Freq: Two times a day (BID) | INTRAVENOUS | Status: DC
  Administered 2018-05-17 – 2018-05-18 (×3): 40 mg via INTRAVENOUS
  Filled 2018-05-17 (×3): qty 40

## 2018-05-17 MED ORDER — LOPERAMIDE HCL 2 MG PO CAPS
4.0000 mg | ORAL_CAPSULE | Freq: Once | ORAL | Status: DC
  Filled 2018-05-17: qty 2

## 2018-05-17 MED ORDER — HYDROCHLOROTHIAZIDE 12.5 MG PO CAPS: 12.5000 mg | ORAL_CAPSULE | Freq: Once | ORAL | Status: DC

## 2018-05-17 MED ORDER — ALBUTEROL SULFATE (2.5 MG/3ML) 0.083% IN NEBU: 2.5000 mg | INHALATION_SOLUTION | Freq: Four times a day (QID) | RESPIRATORY_TRACT | Status: DC | PRN

## 2018-05-17 MED ORDER — ACETAMINOPHEN 650 MG RE SUPP: 650.0000 mg | Freq: Four times a day (QID) | RECTAL | Status: DC | PRN

## 2018-05-17 MED ORDER — METOPROLOL SUCCINATE ER 50 MG PO TB24
50.0000 mg | ORAL_TABLET | Freq: Once | ORAL | Status: DC
  Filled 2018-05-17: qty 1

## 2018-05-17 MED ORDER — IOPAMIDOL (ISOVUE-370) INJECTION 76%
INTRAVENOUS | Status: AC
  Administered 2018-05-17: 80 mL
  Filled 2018-05-17: qty 100

## 2018-05-17 MED ORDER — ACETAMINOPHEN 325 MG PO TABS
650.0000 mg | ORAL_TABLET | Freq: Four times a day (QID) | ORAL | Status: DC | PRN
  Administered 2018-05-18: 650 mg via ORAL
  Filled 2018-05-17: qty 2

## 2018-05-17 MED ORDER — AMLODIPINE BESYLATE 5 MG PO TABS: 10.0000 mg | ORAL_TABLET | Freq: Once | ORAL | Status: DC

## 2018-05-17 MED ORDER — POTASSIUM CHLORIDE IN NACL 20-0.9 MEQ/L-% IV SOLN
INTRAVENOUS | Status: DC
  Filled 2018-05-17: qty 1000

## 2018-05-17 MED ORDER — SODIUM CHLORIDE 0.9 % IV SOLN
8.0000 mg | Freq: Once | INTRAVENOUS | Status: AC
  Administered 2018-05-17: 8 mg via INTRAVENOUS
  Filled 2018-05-17: qty 4

## 2018-05-17 MED ORDER — ASPIRIN EC 81 MG PO TBEC
81.0000 mg | DELAYED_RELEASE_TABLET | Freq: Every day | ORAL | Status: DC
  Administered 2018-05-18: 81 mg via ORAL
  Filled 2018-05-17: qty 1

## 2018-05-17 MED ORDER — METOPROLOL TARTRATE 5 MG/5ML IV SOLN
5.0000 mg | Freq: Four times a day (QID) | INTRAVENOUS | Status: DC
  Administered 2018-05-17 – 2018-05-18 (×4): 5 mg via INTRAVENOUS
  Filled 2018-05-17 (×4): qty 5

## 2018-05-17 MED ORDER — POTASSIUM CHLORIDE 10 MEQ/100ML IV SOLN
10.0000 meq | INTRAVENOUS | Status: DC
  Administered 2018-05-17 (×3): 10 meq via INTRAVENOUS
  Filled 2018-05-17 (×3): qty 100

## 2018-05-17 MED ORDER — LISINOPRIL 10 MG PO TABS: 10.0000 mg | ORAL_TABLET | Freq: Once | ORAL | Status: DC

## 2018-05-17 MED ORDER — ATORVASTATIN CALCIUM 40 MG PO TABS
40.0000 mg | ORAL_TABLET | Freq: Every day | ORAL | Status: DC
  Administered 2018-05-18: 40 mg via ORAL
  Filled 2018-05-17: qty 1

## 2018-05-17 MED ORDER — ONDANSETRON HCL 4 MG PO TABS: 4.0000 mg | ORAL_TABLET | Freq: Four times a day (QID) | ORAL | Status: DC | PRN

## 2018-05-17 MED ORDER — PROMETHAZINE HCL 25 MG/ML IJ SOLN
25.0000 mg | Freq: Once | INTRAMUSCULAR | Status: AC
  Administered 2018-05-17: 25 mg via INTRAVENOUS
  Filled 2018-05-17: qty 1

## 2018-05-17 NOTE — H&P (Addendum)
History and Physical    DOA: 05/17/2018  PCP: Clent Demark, PA-C  Patient coming from: Home  Chief Complaint: nausea/vomiting/diarrhea  HPI: [name] is a 62 y.o. female with history h/o hypertension, hyperlipidemia, TIA, diabetes mellitus presents with complaints of several episodes of nausea and vomiting, nonbloody, associated with loose stools but not associated with hematochezia or melena.  Patient does report sick contact with her grandniece who had GI symptoms recently.  She does report episodes of refractory nausea and vomiting requiring hospital visit last Christmas and a year prior to that.  She denies being diagnosed with diabetic gastroparesis and is generally able to tolerate diet. Work-up in the ED revealed significantly elevated blood pressure with systolic greater than 889, leukocytosis of 19,000, lipase 48, normal LFTs and potassium of 2.7.  Patient received 1 L of IV fluids, total of 12 mg Zofran in the ED with 25 mg Phenergan with no significant relief.  Unable to tolerate oral intake and very nauseous during the interview process.  Denies any abdominal pain currently but reported left-sided pain on presentation.  CT chest abdomen pelvis obtained in the ED to rule out dissection showed no significant findings except for bilateral patchy infiltrates in the lungs.  She does report some cough but denies shortness of breath.  She is requested to be admitted for further evaluation and management.   Review of Systems: As per HPI otherwise 10 point review of systems negative.    Past Medical History:  Diagnosis Date  . Anemia    hx  . Arthritis    "all over" (06/19/2017)  . Diabetes mellitus without complication ([place])   . Hyperlipidemia   . Hypertension   . TIA (transient ischemic attack)    pt unaware of this hx on 06/19/2017    Past Surgical History:  Procedure Laterality Date  . ANTERIOR CERVICAL DECOMP/DISCECTOMY FUSION    . BACK SURGERY    . CESAREAN  SECTION  1986  . IR GENERIC HISTORICAL  09/21/2014   IR ANGIO INTRA EXTRACRAN SEL INTERNAL CAROTID UNI L MOD SED 09/21/2014 [name], MD [facility]  . IR GENERIC HISTORICAL  09/21/2014   IR ANGIO INTRA EXTRACRAN SEL COM CAROTID INNOMINATE UNI R MOD SED 09/21/2014 [name], MD [facility]  . IR GENERIC HISTORICAL  09/21/2014   IR ANGIO VERTEBRAL SEL VERTEBRAL UNI L MOD SED 09/21/2014 [name], MD [facility]  . IR GENERIC HISTORICAL  09/21/2014   IR 3D INDEPENDENT WKST 09/21/2014 [name], MD [facility]  . LAPAROSCOPIC CHOLECYSTECTOMY    . TUBAL LIGATION      Social history:  reports that she has never smoked. She has never used smokeless tobacco. She reports that she drinks alcohol. She reports that she does not use drugs.   No Known Allergies  Family History  Problem Relation Age of Onset  . Hypertension Mother   . Arthritis Mother   . Diabetes Mother   . Heart disease Father   . Diabetes Sister   . Diabetes Brother   . Diabetes Sister   . Cancer Neg Hx       Prior to Admission medications   Medication Sig Start Date End Date Taking? Authorizing Provider  amLODipine (NORVASC) 10 MG tablet Take 1 tablet (10 mg total) by mouth daily. 07/01/17  Yes Clent Demark, PA-C  aspirin EC 81 MG tablet Take 1 tablet (81 mg total) by mouth daily. 02/01/16  Yes Funches, Adriana Mccallum, MD  atorvastatin (LIPITOR)  40 MG tablet Take 1 tablet (40 mg total) daily by mouth. 04/29/17  Yes Clent Demark, PA-C  feeding supplement, ENSURE ENLIVE, (ENSURE ENLIVE) LIQD Take 237 mLs by mouth 3 (three) times daily between meals. 07/01/17  Yes Clent Demark, PA-C  lisinopril-hydrochlorothiazide (ZESTORETIC) 10-12.5 MG tablet Take 2 tablets by mouth daily. 07/01/17  Yes Clent Demark, PA-C  loperamide (IMODIUM A-D) 2 MG tablet Take 1 tablet (2 mg total) by mouth 4 (four) times daily as needed for diarrhea or loose stools. 12/04/17  Yes Fredia Sorrow, MD    metFORMIN (GLUCOPHAGE XR) 500 MG 24 hr tablet Take 1 tablet (500 mg total) by mouth daily with breakfast. 07/01/17  Yes Clent Demark, PA-C  metoprolol succinate (TOPROL-XL) 25 MG 24 hr tablet Take 2 tablets (50 mg total) by mouth daily. 07/01/17  Yes Clent Demark, PA-C  ACCU-CHEK GUIDE test strip USE UP TO TWICE DAILY AS DIRECTED 08/30/17   Clent Demark, PA-C  blood glucose meter kit and supplies KIT Dispense based on patient and insurance preference. Use up to twice daily as directed. (FOR ICD-9 250.00, 250.01). 07/01/17   Clent Demark, PA-C  PARoxetine (PAXIL) 10 MG tablet Take 1 tablet (10 mg total) by mouth daily. Patient not taking: Reported on 05/17/2018 07/01/17   Clent Demark, PA-C  promethazine (PHENERGAN) 25 MG tablet Take 1 tablet (25 mg total) by mouth every 6 (six) hours as needed for nausea or vomiting. Patient not taking: Reported on 05/17/2018 12/04/17   Fredia Sorrow, MD    Physical Exam: Vitals:   05/17/18 1000 05/17/18 1030 05/17/18 1100 05/17/18 1145  BP: (!) 201/103 (!) 170/80 (!) 189/86 (!) 180/91  Pulse: 84 80 83 83  Resp: 15 18 (!) 23 (!) 25  Temp:      TempSrc:      SpO2: 97% 96% 96% 96%    Constitutional: NAD, calm, comfortable Vitals:   05/17/18 1000 05/17/18 1030 05/17/18 1100 05/17/18 1145  BP: (!) 201/103 (!) 170/80 (!) 189/86 (!) 180/91  Pulse: 84 80 83 83  Resp: 15 18 (!) 23 (!) 25  Temp:      TempSrc:      SpO2: 97% 96% 96% 96%   Eyes: PERRL, lids and conjunctivae normal ENMT: Mucous membranes are moist. Posterior pharynx clear of any exudate or lesions.Normal dentition.  Neck: normal, supple, no masses, no thyromegaly Respiratory: clear to auscultation bilaterally, no wheezing, no crackles. Normal respiratory effort. No accessory muscle use.  Cardiovascular: Regular rate and rhythm, no murmurs / rubs / gallops. No extremity edema. 2+ pedal pulses. No carotid bruits.  Abdomen: no tenderness, no masses palpated. No  hepatosplenomegaly. Bowel sounds positive.  Musculoskeletal: no clubbing / cyanosis. No joint deformity upper and lower extremities. Good ROM, no contractures. Normal muscle tone.  Neurologic: CN 2-12 grossly intact. Sensation intact, DTR normal. Strength 5/5 in all 4.  Psychiatric: Normal judgment and insight. Alert and oriented x 3. Normal mood.  SKIN/catheters: no rashes, lesions, ulcers. No induration  Labs on Admission: I have personally reviewed following labs and imaging studies  CBC: Recent Labs  Lab 05/17/18 0549  WBC 19.8*  NEUTROABS 17.4*  HGB 13.8  HCT 45.6  MCV 88.7  PLT 010*   Basic Metabolic Panel: Recent Labs  Lab 05/17/18 0549  NA 138  K 2.7*  CL 101  CO2 22  GLUCOSE 255*  BUN 16  CREATININE 0.77  CALCIUM 9.8  MG 1.9   GFR:  CrCl cannot be calculated (Unknown ideal weight.). Liver Function Tests: Recent Labs  Lab 05/17/18 0549  AST 23  ALT 15  ALKPHOS 92  BILITOT 0.5  PROT 8.5*  ALBUMIN 4.6   Recent Labs  Lab 05/17/18 0549  LIPASE 48   No results for input(s): AMMONIA in the last 168 hours. Coagulation Profile: No results for input(s): INR, PROTIME in the last 168 hours. Cardiac Enzymes: No results for input(s): CKTOTAL, CKMB, CKMBINDEX, TROPONINI in the last 168 hours. BNP (last 3 results) No results for input(s): PROBNP in the last 8760 hours. HbA1C: No results for input(s): HGBA1C in the last 72 hours. CBG: No results for input(s): GLUCAP in the last 168 hours. Lipid Profile: No results for input(s): CHOL, HDL, LDLCALC, TRIG, CHOLHDL, LDLDIRECT in the last 72 hours. Thyroid Function Tests: No results for input(s): TSH, T4TOTAL, FREET4, T3FREE, THYROIDAB in the last 72 hours. Anemia Panel: No results for input(s): VITAMINB12, FOLATE, FERRITIN, TIBC, IRON, RETICCTPCT in the last 72 hours. Urine analysis:    Component Value Date/Time   COLORURINE STRAW (A) 05/17/2018 0824   APPEARANCEUR CLEAR 05/17/2018 0824   LABSPEC 1.022  05/17/2018 0824   PHURINE 7.0 05/17/2018 0824   GLUCOSEU >=500 (A) 05/17/2018 0824   HGBUR SMALL (A) 05/17/2018 0824   BILIRUBINUR NEGATIVE 05/17/2018 0824   BILIRUBINUR neg 10/25/2014 1506   KETONESUR 5 (A) 05/17/2018 0824   PROTEINUR 30 (A) 05/17/2018 0824   UROBILINOGEN 0.2 10/25/2014 1506   UROBILINOGEN 0.2 10/14/2014 1247   NITRITE NEGATIVE 05/17/2018 0824   LEUKOCYTESUR NEGATIVE 05/17/2018 0824    Radiological Exams on Admission: Ct Angio Chest/abd/pel For Dissection W And/or Wo Contrast  Result Date: 05/17/2018 CLINICAL DATA:  Hypertension, acute left abdominal pain, nausea and vomiting, concern for dissection EXAM: CT ANGIOGRAPHY CHEST, ABDOMEN AND PELVIS TECHNIQUE: Multidetector CT imaging through the chest, abdomen and pelvis was performed using the standard protocol during bolus administration of intravenous contrast. Multiplanar reconstructed images and MIPs were obtained and reviewed to evaluate the vascular anatomy. CONTRAST:  75m ISOVUE-370 IOPAMIDOL (ISOVUE-370) INJECTION 76% COMPARISON:  12/04/2017 FINDINGS: CTA CHEST FINDINGS Cardiovascular: Initial noncontrast chest imaging demonstrates no hyperdense thoracic aortic wall thickening suggests intramural hemorrhage/hematoma. Postcontrast, atherosclerosis noted of the thoracic aorta. Negative for aneurysm or dissection. Patent 3 vessel arch anatomy. Pulmonary arteries also appear patent. No significant filling defect or pulmonary embolus by CTA. Native coronary atherosclerosis noted. Normal heart size. No pericardial effusion. Mediastinum/Nodes: Thyroid, trachea, esophagus demonstrate no acute or significant finding. Esophagus is nondilated. Small hiatal hernia noted. No adenopathy. Lungs/Pleura: Minor scattered patchy and nodular ground-glass opacities in the upper lobes, right middle lobe, lingula, and right lower lobe. This remains nonspecific for scattered patchy alveolitis/pneumonitis. Early pneumonia could have a similar  appearance as well as mild inhalational injury. Minor associated atelectasis in the right middle lobe, lingula, and left lower lobe. No pleural abnormality, effusion, or pneumothorax. Trachea central airways remain patent. No mucous plugging or airway obstruction. Musculoskeletal: Lower cervical fusion hardware noted. Minor thoracic spondylosis. No acute osseous finding or fracture. Sternum intact. Review of the MIP images confirms the above findings. CTA ABDOMEN AND PELVIS FINDINGS VASCULAR Aorta: Intact abdominal aorta. Negative for aneurysm or dissection. No retroperitoneal hemorrhage or acute vascular finding. Minor atherosclerosis of the aorta without occlusive disease. Celiac: Widely patent including its branches SMA: Widely patent including its branches Renals: Both renal arteries remain widely patent. No accessory renal artery appreciated. IMA: Remains patent off the distal aorta including its branches Inflow: Iliac  atherosclerosis noted. Iliac vessels remain patent with no inflow disease. The common, internal and external iliac arteries are patent. Veins: Venous phase imaging not performed. Review of the MIP images confirms the above findings. NON-VASCULAR Hepatobiliary: No focal liver abnormality is seen. Status post cholecystectomy. No biliary dilatation. Pancreas: Unremarkable. No pancreatic ductal dilatation or surrounding inflammatory changes. Spleen: Normal in size without focal abnormality. Adrenals/Urinary Tract: Normal adrenal glands. No renal obstruction or hydronephrosis. No perinephric inflammatory process or edema. Ureters are symmetric and decompressed. No ureteral obstruction, significant stone disease, bladder abnormality. Stomach/Bowel: Negative for bowel obstruction, significant dilatation, ileus, free air. Exam of the bowel is limited without oral contrast. Scattered colonic diverticulosis without acute inflammatory process. Normal appearing appendix. No fluid collection, abscess,  hemorrhage, or hematoma. Lymphatic: No abdominal or pelvic adenopathy. Reproductive: Uterus and bilateral adnexa are unremarkable. Other: No inguinal abnormality. Intact abdominal wall. Midline anterior abdominal wall soft tissue thickening noted just above the umbilicus, suspect postoperative or previous hernia repair. No recurrent hernia. This has a stable appearance compared to 12/04/2017 Musculoskeletal: Degenerative changes noted of the spine. Facet arthropathy noted at L4-5 and L5-S1. Similar grade 1 anterolisthesis of L4 upon L5 related to the facet arthropathy. No pars defects. No acute compression fracture. Review of the MIP images confirms the above findings. 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 Negative for dissection, aneurysm, hemorrhage, or hematoma. Negative for acute pulmonary embolus Mesenteric and renal vasculature all remain patent. No abdominopelvic occlusive vascular process Patent aortoiliac vasculature without occlusion or inflow disease. Scattered patchy and nodular ground-glass opacities as above remain nonspecific but can be seen with patchy alveolitis, pneumonitis, or early pneumonia. Right middle lobe, lingula, left lower lobe atelectasis No other acute intra-abdominopelvic finding by CT. Electronically Signed   By: Jerilynn Mages.  Shick M.D.   On: 05/17/2018 08:52    EKG: Independently reviewed.  Normal sinus rhythm with lateral T wave inversions (new)     Assessment and Plan:   1.  Refractory nausea/vomiting: Since associated with diarrhea and gives history of sick contact would favor gastroenteritis over gastroparesis.  CT abdomen/pelvis negative for any signs of obstruction or infection.  Supportive management for now with Reglan/Compazine as needed.  Continue IV hydration and n.p.o. with IV PPI  2.  Malignant hypertension: Secondary to problem #1 and inability to tolerate oral medications.  Will order clonidine patch and change to IV  antihypertensives until able to tolerate oral meds.  Hydralazine as needed available.  3.  Hypokalemia: Secondary to problem #1: Patient receiving IV potassium x4 bags through ED.  Will place on potassium supplemented maintenance fluids.  Recheck level this evening.  4.  Lung infiltrates: Could be secondary to aspiration in the setting of vomiting versus viral illness versus atelectasis.  Will treat empirically with IV Unasyn to cover aspiration organisms in order incentive spirometry.  Viral panel.  5.  Diabetes mellitus: Patient only on low-dose metformin at baseline.  Hold for now.  Check hemoglobin A1c.  Sliding scale insulin as n.p.o.  6.  TIA: Resume oral medications when able to tolerate.  7. Abnormal EKG: likely sec to hypokalemia. Will cycle CE and obtain echo. No c/o chest pain     DVT prophylaxis: SCDs  Code Status: Full code  Family Communication: Discussed with patient. Health care proxy would be her brother Consults called: None Admission status:  Patient admitted as observation as anticipated LOS less than 2 midnights    Guilford Shi MD [facility] Pager [phone]  If 7PM-7AM,  please contact night-coverage [email or web address] Password [facility]  05/17/2018, 12:48 PM

## 2018-05-17 NOTE — ED Provider Notes (Signed)
8:43 AM Assumed care in sign out. Pt still actively vomiting. Given her hypokalemia and inability to tolerate PO, she will need admitted. CT pending.    Virgel Manifold, MD 05/26/18 [phone]

## 2018-05-17 NOTE — ED Notes (Signed)
Nurse starting IV and will draw labs. 

## 2018-05-17 NOTE — ED Provider Notes (Signed)
TIME SEEN: 5:50 AM  CHIEF COMPLAINT: Nausea, vomiting, diarrhea  HPI: Patient is a 62 year old female with history of hypertension, TIA, hyperlipidemia who presents to the emergency department with complaints of nausea, vomiting and diarrhea that started this morning.   She was found to be very hypertensive with EMS with a blood pressure of 230/120s.  It appears she is on lisinopril, metoprolol, hydrochlorothiazide and amlodipine.  Reports she has missed the last 2 days of her medications.  She denies any headache, chest pain, abdominal pain, blurry vision, numbness or focal weakness.  No known sick contacts or recent travel.  She has had previous C-section.  Received 4 mg of IV Zofran with EMS.  ROS: See HPI Constitutional: no fever  Eyes: no drainage  ENT: no runny nose   Cardiovascular:  no chest pain  Resp: no SOB  GI: Diarrhea and vomiting GU: no dysuria Integumentary: no rash  Allergy: no hives  Musculoskeletal: no leg swelling  Neurological: no slurred speech ROS otherwise negative  PAST MEDICAL HISTORY/PAST SURGICAL HISTORY:  Past Medical History:  Diagnosis Date  . Anemia    hx  . Arthritis    "all over" (06/19/2017)  . Hyperlipidemia   . Hypertension   . TIA (transient ischemic attack)    pt unaware of this hx on 06/19/2017    MEDICATIONS:  Prior to Admission medications   Medication Sig Start Date End Date Taking? Authorizing Provider  ACCU-CHEK GUIDE test strip USE UP TO TWICE DAILY AS DIRECTED 08/30/17   Clent Demark, PA-C  amLODipine (NORVASC) 10 MG tablet Take 1 tablet (10 mg total) by mouth daily. 07/01/17   Clent Demark, PA-C  aspirin EC 81 MG tablet Take 1 tablet (81 mg total) by mouth daily. 02/01/16   Funches, Adriana Mccallum, MD  atorvastatin (LIPITOR) 40 MG tablet Take 1 tablet (40 mg total) daily by mouth. 04/29/17   Clent Demark, PA-C  blood glucose meter kit and supplies KIT Dispense based on patient and insurance preference. Use up to twice daily  as directed. (FOR ICD-9 250.00, 250.01). 07/01/17   Clent Demark, PA-C  feeding supplement, ENSURE ENLIVE, (ENSURE ENLIVE) LIQD Take 237 mLs by mouth 3 (three) times daily between meals. 07/01/17   Clent Demark, PA-C  fluconazole (DIFLUCAN) 150 MG tablet Take one tablet at the beginning of your antibiotic use. Then one tablet at the completion of your antibiotic. 07/17/17   Clent Demark, PA-C  lisinopril-hydrochlorothiazide (ZESTORETIC) 10-12.5 MG tablet Take 2 tablets by mouth daily. 07/01/17   Clent Demark, PA-C  loperamide (IMODIUM A-D) 2 MG tablet Take 1 tablet (2 mg total) by mouth 4 (four) times daily as needed for diarrhea or loose stools. 12/04/17   Fredia Sorrow, MD  metFORMIN (GLUCOPHAGE XR) 500 MG 24 hr tablet Take 1 tablet (500 mg total) by mouth daily with breakfast. 07/01/17   Clent Demark, PA-C  metoprolol succinate (TOPROL-XL) 25 MG 24 hr tablet Take 2 tablets (50 mg total) by mouth daily. 07/01/17   Clent Demark, PA-C  PARoxetine (PAXIL) 10 MG tablet Take 1 tablet (10 mg total) by mouth daily. 07/01/17   Clent Demark, PA-C  promethazine (PHENERGAN) 25 MG tablet Take 1 tablet (25 mg total) by mouth every 6 (six) hours as needed for nausea or vomiting. 12/04/17   Fredia Sorrow, MD    ALLERGIES:  No Known Allergies  SOCIAL HISTORY:  Social History   Tobacco Use  . Smoking status: Never  Smoker  . Smokeless tobacco: Never Used  Substance Use Topics  . Alcohol use: Yes    FAMILY HISTORY: Family History  Problem Relation Age of Onset  . Hypertension Mother   . Arthritis Mother   . Diabetes Mother   . Heart disease Father   . Diabetes Sister   . Diabetes Brother   . Diabetes Sister   . Cancer Neg Hx     EXAM: BP (!) 224/105 (BP Location: Right Arm)   Pulse 70   Resp 18   SpO2 96%  CONSTITUTIONAL: Alert and oriented and responds appropriately to questions. Well-appearing; well-nourished HEAD: Normocephalic EYES: Conjunctivae clear,  pupils appear equal, EOMI ENT: normal nose; moist mucous membranes NECK: Supple, no meningismus, no nuchal rigidity, no LAD  CARD: RRR; S1 and S2 appreciated; no murmurs, no clicks, no rubs, no gallops RESP: Normal chest excursion without splinting or tachypnea; breath sounds clear and equal bilaterally; no wheezes, no rhonchi, no rales, no hypoxia or respiratory distress, speaking full sentences ABD/GI: Normal bowel sounds; non-distended; soft, tender in the left upper and lower quadrant, no rebound, no guarding, no peritoneal signs, no hepatosplenomegaly BACK:  The back appears normal and is non-tender to palpation, there is no CVA tenderness EXT: Normal ROM in all joints; non-tender to palpation; no edema; normal capillary refill; no cyanosis, no calf tenderness or swelling    SKIN: Normal color for age and race; warm; no rash NEURO: Moves all extremities equally PSYCH: The patient's mood and manner are appropriate. Grooming and personal hygiene are appropriate.  MEDICAL DECISION MAKING: Patient here with vomiting and diarrhea.  Found to be very hypertensive and reports noncompliance with medication over the past couple of days.  She denies any pain but is quite tender throughout the left side of her abdomen.  Differential includes gastroenteritis, colitis, diverticulitis but also dissection, aneurysm given her blood pressure being so elevated.  Will obtain labs, urine, CTA of her chest, abdomen and pelvis.  Will give Zofran, Imodium for symptomatic relief as well as IV hydralazine for her elevated blood pressure.  ED PROGRESS: Labs show leukocytosis of 19,000 with left shift.  Potassium of 2.7.  Will give IV replacement.  We will also check magnesium level.  Troponin is negative.  LFTs, lipase, renal function normal.  Urine, CT scan pending.  Patient continues to be hypertensive.  Will give second dose of IV hydralazine.  She is still vomiting despite a second dose of IV Zofran.  Will give IV  Phenergan.   8:05 AM  Blood pressure has improved to 170/80s.  She reports improvement in nausea and vomiting after Phenergan.  I have ordered her home blood pressure medications.  Signed out to Dr. Wilson Singer to follow-up on patient's CT imaging.  I reviewed all nursing notes, vitals, pertinent previous records, EKGs, lab and urine results, imaging (as available).    EKG Interpretation  Date/Time:  Saturday May 17 2018 05:42:13 EST Ventricular Rate:  69 PR Interval:    QRS Duration: 118 QT Interval:  425 QTC Calculation: 456 R Axis:   -26 Text Interpretation:  Normal sinus rhythm Artifact ST changes in anterolateral leads new compared to previous Confirmed by Ward, Cyril Mourning [phone]) on 05/17/2018 5:50:50 AM        CRITICAL CARE Performed by: Cyril Mourning Ward   Total critical care time: 45 minutes  Critical care time was exclusive of separately billable procedures and treating other patients.  Critical care was necessary to treat or prevent imminent or life-threatening  deterioration.  Critical care was time spent personally by me on the following activities: development of treatment plan with patient and/or surrogate as well as nursing, discussions with consultants, evaluation of patient's response to treatment, examination of patient, obtaining history from patient or surrogate, ordering and performing treatments and interventions, ordering and review of laboratory studies, ordering and review of radiographic studies, pulse oximetry and re-evaluation of patient's condition.     Ward, Delice Bison, DO 05/17/18 [phone]

## 2018-05-17 NOTE — ED Triage Notes (Signed)
Pt brought in by EMS due to having n/v/d. Pt is hypertensive at 212/120. Pt received 4mg  of zofran.

## 2018-05-17 NOTE — ED Notes (Signed)
Dr. Leonides Schanz notified that potassium resulted at 2.7

## 2018-05-17 NOTE — ED Notes (Signed)
Patient transported to CT 

## 2018-05-17 NOTE — ED Notes (Signed)
Attempted report x1. 

## 2018-05-17 NOTE — Progress Notes (Signed)
*  PRELIMINARY RESULTS* Echocardiogram 2D Echocardiogram has been performed.  Samuel Germany 05/17/2018, 4:46 PM

## 2018-05-18 DIAGNOSIS — E876 Hypokalemia: Secondary | ICD-10-CM

## 2018-05-18 DIAGNOSIS — R112 Nausea with vomiting, unspecified: Secondary | ICD-10-CM

## 2018-05-18 DIAGNOSIS — I10 Essential (primary) hypertension: Secondary | ICD-10-CM | POA: Diagnosis not present

## 2018-05-18 DIAGNOSIS — K529 Noninfective gastroenteritis and colitis, unspecified: Secondary | ICD-10-CM

## 2018-05-18 DIAGNOSIS — R197 Diarrhea, unspecified: Secondary | ICD-10-CM

## 2018-05-18 LAB — BASIC METABOLIC PANEL
Anion gap: 11 (ref 5–15)
BUN: 9 mg/dL (ref 8–23)
CO2: 18 mmol/L — ABNORMAL LOW (ref 22–32)
Calcium: 8.7 mg/dL — ABNORMAL LOW (ref 8.9–10.3)
Chloride: 109 mmol/L (ref 98–111)
Creatinine, Ser: 0.86 mg/dL (ref 0.44–1.00)
GFR calc Af Amer: >60 mL/min (ref >60)
GFR calc non Af Amer: >60 mL/min (ref >60)
Glucose, Bld: 109 mg/dL — ABNORMAL HIGH (ref 70–99)
Potassium: 3.5 mmol/L (ref 3.5–5.1)
Sodium: 138 mmol/L (ref 135–145)

## 2018-05-18 LAB — CBC
HCT: 38.3 % (ref 36.0–46.0)
Hemoglobin: 12 g/dL (ref 12.0–15.0)
MCH: 27 pg (ref 26.0–34.0)
MCHC: 31.3 g/dL (ref 30.0–36.0)
MCV: 86.1 fL (ref 80.0–100.0)
Platelets: 275 10*3/uL (ref 150–400)
RBC: 4.45 MIL/uL (ref 3.87–5.11)
RDW: 12.9 % (ref 11.5–15.5)
WBC: 12.8 10*3/uL — ABNORMAL HIGH (ref 4.0–10.5)
nRBC: 0 % (ref 0.0–0.2)

## 2018-05-18 LAB — ECHOCARDIOGRAM COMPLETE
Height: 62 in
Weight: 1763.68 oz

## 2018-05-18 LAB — TROPONIN I: Troponin I: <0.03 ng/mL (ref <0.03)

## 2018-05-18 LAB — GLUCOSE, CAPILLARY: Glucose-Capillary: 94 mg/dL (ref 70–99)

## 2018-05-18 MED ORDER — PROMETHAZINE HCL 25 MG PO TABS: 25.0000 mg | ORAL_TABLET | Freq: Four times a day (QID) | ORAL | 0 refills | Status: DC | PRN

## 2018-05-18 MED ORDER — INFLUENZA VAC SPLIT QUAD 0.5 ML IM SUSY: 0.5000 mL | PREFILLED_SYRINGE | INTRAMUSCULAR | Status: DC

## 2018-05-18 MED ORDER — LOPERAMIDE HCL 2 MG PO TABS: 2.0000 mg | ORAL_TABLET | Freq: Four times a day (QID) | ORAL | 0 refills | Status: DC | PRN

## 2018-05-18 MED ORDER — AMOXICILLIN-POT CLAVULANATE 875-125 MG PO TABS: 1.0000 | ORAL_TABLET | Freq: Two times a day (BID) | ORAL | 0 refills | Status: AC

## 2018-05-18 MED ORDER — METRONIDAZOLE 500 MG PO TABS: 500.0000 mg | ORAL_TABLET | Freq: Three times a day (TID) | ORAL | 0 refills | Status: DC

## 2018-05-18 NOTE — Discharge Summary (Signed)
[name] JKQ:[id number] DOB: Aug 01, 1955 DOA: 05/17/2018  PCP: Clent Demark, PA-C  Admit date: 05/17/2018  Discharge date: 05/18/2018  Admitted From: Home   Disposition:  Home   Recommendations for Outpatient Follow-up:   Follow up with PCP in 1-2 weeks  PCP Please obtain BMP/CBC, 2 view CXR in 1week,  (see Discharge instructions)   PCP Please follow up on the following pending results:    Home Health: None   Equipment/Devices: None  Consultations: None Discharge Condition: Stable   CODE STATUS: Full   Diet Recommendation: Heart Healthy     Chief Complaint  Patient presents with  . Abdominal Pain     Brief history of present illness from the day of admission and additional interim summary    [name] is a 62 y.o. female with history h/o hypertension, hyperlipidemia, TIA, diabetes mellitus presents with complaints of several episodes of nausea and vomiting, nonbloody, associated with loose stools but not associated with hematochezia or melena.  Patient does report sick contact with her grandniece who had GI symptoms recently.                                                                    Hospital Course    1.  Gastroenteritis.  Likely infectious although GI pathogen panel was negative, CT abdomen pelvis unremarkable, symptoms completely resolved over 24 hours, tolerating diet, symptom-free wants to go home, will get supportive care with antiemetic and Imodium as needed. DC home with PCP follow-up in a week.  2.  Malignant hypertension upon admission.  Likely due to distress from #1 above.  Resolved continue home medications.  3.  Hypokalemia.  Replaced and stable  4.  Possible aspiration pneumonia as suggested on CT chest.  Responded to Unasyn, likely aspiration due to emesis,  4 more days of oral Augmentin, PCP to repeat two-view chest x-ray next visit.  5. Nonspecific flipped T waves on EKG without any chest pain likely due to uncontrolled blood pressure, troponin negative x3, blood pressure in good control, echocardiogram showed a preserved EF with no wall motion abnormality.  PCP can repeat EKG next visit in the office.   6. Chronic medical issues of DM type II, TIA unremarkable.  Renew home medications unchanged.    Discharge diagnosis     Active Problems:   Gastroenteritis   Nausea and vomiting    Discharge instructions    Discharge Instructions    Diet - low sodium heart healthy   Complete by:  As directed    Discharge instructions   Complete by:  As directed    Follow with Primary MD Clent Demark, PA-C in 7 days   Get CBC, CMP checked  by Primary MD in 5-7 days   Activity: As tolerated  with Full fall precautions use walker/cane & assistance as needed  Disposition Home   Diet: Heart Healthy    Special Instructions: If you have smoked or chewed Tobacco  in the last 2 yrs please stop smoking, stop any regular Alcohol  and or any Recreational drug use.  On your next visit with your primary care physician please Get Medicines reviewed and adjusted.  Please request your Prim.MD to go over all Hospital Tests and Procedure/Radiological results at the follow up, please get all Hospital records sent to your Prim MD by signing hospital release before you go home.  If you experience worsening of your admission symptoms, develop shortness of breath, life threatening emergency, suicidal or homicidal thoughts you must seek medical attention immediately by calling 911 or calling your MD immediately  if symptoms less severe.  You Must read complete instructions/literature along with all the possible adverse reactions/side effects for all the Medicines you take and that have been prescribed to you. Take any new Medicines after you have completely  understood and accpet all the possible adverse reactions/side effects.   Increase activity slowly   Complete by:  As directed       Discharge Medications   Allergies as of 05/18/2018   No Known Allergies     Medication List    TAKE these medications   ACCU-CHEK GUIDE test strip Generic drug:  glucose blood USE UP TO TWICE DAILY AS DIRECTED   amLODipine 10 MG tablet Commonly known as:  NORVASC Take 1 tablet (10 mg total) by mouth daily.   amoxicillin-clavulanate 875-125 MG tablet Commonly known as:  AUGMENTIN Take 1 tablet by mouth 2 (two) times daily for 4 days.   aspirin EC 81 MG tablet Take 1 tablet (81 mg total) by mouth daily.   atorvastatin 40 MG tablet Commonly known as:  LIPITOR Take 1 tablet (40 mg total) daily by mouth.   blood glucose meter kit and supplies Kit Dispense based on patient and insurance preference. Use up to twice daily as directed. (FOR ICD-9 250.00, 250.01).   feeding supplement (ENSURE ENLIVE) Liqd Take 237 mLs by mouth 3 (three) times daily between meals.   lisinopril-hydrochlorothiazide 10-12.5 MG tablet Commonly known as:  PRINZIDE,ZESTORETIC Take 2 tablets by mouth daily.   loperamide 2 MG tablet Commonly known as:  IMODIUM A-D Take 1 tablet (2 mg total) by mouth 4 (four) times daily as needed for diarrhea or loose stools.   metFORMIN 500 MG 24 hr tablet Commonly known as:  GLUCOPHAGE-XR Take 1 tablet (500 mg total) by mouth daily with breakfast.   metoprolol succinate 25 MG 24 hr tablet Commonly known as:  TOPROL-XL Take 2 tablets (50 mg total) by mouth daily.   PARoxetine 10 MG tablet Commonly known as:  PAXIL Take 1 tablet (10 mg total) by mouth daily.   promethazine 25 MG tablet Commonly known as:  PHENERGAN Take 1 tablet (25 mg total) by mouth every 6 (six) hours as needed for nausea or vomiting.       Follow-up Information    Clent Demark, PA-C. Schedule an appointment as soon as possible for a visit in 1  week(s).   Specialty:  Physician Assistant Contact information: [place] [address] [phone]           Major procedures and Radiology Reports - PLEASE review detailed and final reports thoroughly  -         Ct Angio Chest/abd/pel For Dissection W And/or  Wo Contrast  Result Date: 05/17/2018 CLINICAL DATA:  Hypertension, acute left abdominal pain, nausea and vomiting, concern for dissection EXAM: CT ANGIOGRAPHY CHEST, ABDOMEN AND PELVIS TECHNIQUE: Multidetector CT imaging through the chest, abdomen and pelvis was performed using the standard protocol during bolus administration of intravenous contrast. Multiplanar reconstructed images and MIPs were obtained and reviewed to evaluate the vascular anatomy. CONTRAST:  38m ISOVUE-370 IOPAMIDOL (ISOVUE-370) INJECTION 76% COMPARISON:  12/04/2017 FINDINGS: CTA CHEST FINDINGS Cardiovascular: Initial noncontrast chest imaging demonstrates no hyperdense thoracic aortic wall thickening suggests intramural hemorrhage/hematoma. Postcontrast, atherosclerosis noted of the thoracic aorta. Negative for aneurysm or dissection. Patent 3 vessel arch anatomy. Pulmonary arteries also appear patent. No significant filling defect or pulmonary embolus by CTA. Native coronary atherosclerosis noted. Normal heart size. No pericardial effusion. Mediastinum/Nodes: Thyroid, trachea, esophagus demonstrate no acute or significant finding. Esophagus is nondilated. Small hiatal hernia noted. No adenopathy. Lungs/Pleura: Minor scattered patchy and nodular ground-glass opacities in the upper lobes, right middle lobe, lingula, and right lower lobe. This remains nonspecific for scattered patchy alveolitis/pneumonitis. Early pneumonia could have a similar appearance as well as mild inhalational injury. Minor associated atelectasis in the right middle lobe, lingula, and left lower lobe. No pleural abnormality, effusion, or pneumothorax. Trachea central airways  remain patent. No mucous plugging or airway obstruction. Musculoskeletal: Lower cervical fusion hardware noted. Minor thoracic spondylosis. No acute osseous finding or fracture. Sternum intact. Review of the MIP images confirms the above findings. CTA ABDOMEN AND PELVIS FINDINGS VASCULAR Aorta: Intact abdominal aorta. Negative for aneurysm or dissection. No retroperitoneal hemorrhage or acute vascular finding. Minor atherosclerosis of the aorta without occlusive disease. Celiac: Widely patent including its branches SMA: Widely patent including its branches Renals: Both renal arteries remain widely patent. No accessory renal artery appreciated. IMA: Remains patent off the distal aorta including its branches Inflow: Iliac atherosclerosis noted. Iliac vessels remain patent with no inflow disease. The common, internal and external iliac arteries are patent. Veins: Venous phase imaging not performed. Review of the MIP images confirms the above findings. NON-VASCULAR Hepatobiliary: No focal liver abnormality is seen. Status post cholecystectomy. No biliary dilatation. Pancreas: Unremarkable. No pancreatic ductal dilatation or surrounding inflammatory changes. Spleen: Normal in size without focal abnormality. Adrenals/Urinary Tract: Normal adrenal glands. No renal obstruction or hydronephrosis. No perinephric inflammatory process or edema. Ureters are symmetric and decompressed. No ureteral obstruction, significant stone disease, bladder abnormality. Stomach/Bowel: Negative for bowel obstruction, significant dilatation, ileus, free air. Exam of the bowel is limited without oral contrast. Scattered colonic diverticulosis without acute inflammatory process. Normal appearing appendix. No fluid collection, abscess, hemorrhage, or hematoma. Lymphatic: No abdominal or pelvic adenopathy. Reproductive: Uterus and bilateral adnexa are unremarkable. Other: No inguinal abnormality. Intact abdominal wall. Midline anterior abdominal  wall soft tissue thickening noted just above the umbilicus, suspect postoperative or previous hernia repair. No recurrent hernia. This has a stable appearance compared to 12/04/2017 Musculoskeletal: Degenerative changes noted of the spine. Facet arthropathy noted at L4-5 and L5-S1. Similar grade 1 anterolisthesis of L4 upon L5 related to the facet arthropathy. No pars defects. No acute compression fracture. Review of the MIP images confirms the above findings. 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 Negative for dissection, aneurysm, hemorrhage, or hematoma. Negative for acute pulmonary embolus Mesenteric and renal vasculature all remain patent. No abdominopelvic occlusive vascular process Patent aortoiliac vasculature without occlusion or inflow disease. Scattered patchy and nodular ground-glass opacities as above remain nonspecific but can be seen with patchy alveolitis, pneumonitis, or early pneumonia. Right  middle lobe, lingula, left lower lobe atelectasis No other acute intra-abdominopelvic finding by CT. Electronically Signed   By: Jerilynn Mages.  Shick M.D.   On: 05/17/2018 08:52    Micro Results     Recent Results (from the past 240 hour(s))  Respiratory Panel by PCR     Status: None   Collection Time: 05/17/18  3:22 PM  Result Value Ref Range Status   Adenovirus NOT DETECTED NOT DETECTED Final   Coronavirus 229E NOT DETECTED NOT DETECTED Final   Coronavirus HKU1 NOT DETECTED NOT DETECTED Final   Coronavirus NL63 NOT DETECTED NOT DETECTED Final   Coronavirus OC43 NOT DETECTED NOT DETECTED Final   Metapneumovirus NOT DETECTED NOT DETECTED Final   Rhinovirus / Enterovirus NOT DETECTED NOT DETECTED Final   Influenza A NOT DETECTED NOT DETECTED Final   Influenza B NOT DETECTED NOT DETECTED Final   Parainfluenza Virus 1 NOT DETECTED NOT DETECTED Final   Parainfluenza Virus 2 NOT DETECTED NOT DETECTED Final   Parainfluenza Virus 3 NOT DETECTED NOT DETECTED Final    Parainfluenza Virus 4 NOT DETECTED NOT DETECTED Final   Respiratory Syncytial Virus NOT DETECTED NOT DETECTED Final   Bordetella pertussis NOT DETECTED NOT DETECTED Final   Chlamydophila pneumoniae NOT DETECTED NOT DETECTED Final   Mycoplasma pneumoniae NOT DETECTED NOT DETECTED Final    Comment: Performed at [facility], [address] [place], [place] [address]    Today   Subjective    [name] today has no headache,no chest abdominal pain,no new weakness tingling or numbness, feels much better wants to go home today.    Objective   Blood pressure (!) 153/86, pulse 70, temperature 98.8 F (37.1 C), temperature source Oral, resp. rate 18, height '5\' 2"'  (1.575 m), weight 50 kg, SpO2 99 %.   Intake/Output Summary (Last 24 hours) at 05/18/2018 0955 Last data filed at 05/18/2018 0500 Gross per 24 hour  Intake 1646.11 ml  Output 450 ml  Net 1196.11 ml    Exam Awake Alert, Oriented x 3, No new F.N deficits, Normal affect [place].AT,PERRAL Supple Neck,No JVD, No cervical lymphadenopathy appriciated.  Symmetrical Chest wall movement, Good air movement bilaterally, CTAB RRR,No Gallops,Rubs or new Murmurs, No Parasternal Heave +ve B.Sounds, Abd Soft, Non tender, No organomegaly appriciated, No rebound -guarding or rigidity. No Cyanosis, Clubbing or edema, No new Rash or bruise   Data Review   CBC w Diff:  Lab Results  Component Value Date   WBC 12.8 (H) 05/18/2018   HGB 12.0 05/18/2018   HGB 11.1 07/01/2017   HCT 38.3 05/18/2018   HCT 34.2 07/01/2017   PLT 275 05/18/2018   PLT 430 (H) 07/01/2017   LYMPHOPCT 9 05/17/2018   MONOPCT 2 05/17/2018   EOSPCT 0 05/17/2018   BASOPCT 0 05/17/2018    CMP:  Lab Results  Component Value Date   NA 138 05/18/2018   NA 140 07/01/2017   K 3.5 05/18/2018   CL 109 05/18/2018   CO2 18 (L) 05/18/2018   BUN 9 05/18/2018   BUN 15 07/01/2017   CREATININE 0.86 05/18/2018   CREATININE 0.72 02/24/2013   PROT 8.5 (H)  05/17/2018   PROT 6.8 07/01/2017   ALBUMIN 4.6 05/17/2018   ALBUMIN 4.0 07/01/2017   BILITOT 0.5 05/17/2018   BILITOT <0.2 07/01/2017   ALKPHOS 92 05/17/2018   AST 23 05/17/2018   ALT 15 05/17/2018  .   Total Time in preparing paper work, data evaluation and todays exam -  35 minutes  Lala Lund M.D on 05/18/2018 at 9:55 AM  [facility]   Office  [phone]

## 2018-05-18 NOTE — Progress Notes (Signed)
Nsg Discharge Note  Admit Date:  05/17/2018 Discharge date: 05/18/2018   [name] to be D/C'd Home per MD order.  AVS completed.  Copy for chart, and copy for patient signed, and dated. Patient/caregiver able to verbalize understanding.  Discharge Medication: Allergies as of 05/18/2018   No Known Allergies     Medication List    TAKE these medications   ACCU-CHEK GUIDE test strip Generic drug:  glucose blood USE UP TO TWICE DAILY AS DIRECTED   amLODipine 10 MG tablet Commonly known as:  NORVASC Take 1 tablet (10 mg total) by mouth daily.   aspirin EC 81 MG tablet Take 1 tablet (81 mg total) by mouth daily.   atorvastatin 40 MG tablet Commonly known as:  LIPITOR Take 1 tablet (40 mg total) daily by mouth.   blood glucose meter kit and supplies Kit Dispense based on patient and insurance preference. Use up to twice daily as directed. (FOR ICD-9 250.00, 250.01).   feeding supplement (ENSURE ENLIVE) Liqd Take 237 mLs by mouth 3 (three) times daily between meals.   lisinopril-hydrochlorothiazide 10-12.5 MG tablet Commonly known as:  PRINZIDE,ZESTORETIC Take 2 tablets by mouth daily.   loperamide 2 MG tablet Commonly known as:  IMODIUM A-D Take 1 tablet (2 mg total) by mouth 4 (four) times daily as needed for diarrhea or loose stools.   metFORMIN 500 MG 24 hr tablet Commonly known as:  GLUCOPHAGE-XR Take 1 tablet (500 mg total) by mouth daily with breakfast.   metoprolol succinate 25 MG 24 hr tablet Commonly known as:  TOPROL-XL Take 2 tablets (50 mg total) by mouth daily.   metroNIDAZOLE 500 MG tablet Commonly known as:  FLAGYL Take 1 tablet (500 mg total) by mouth 3 (three) times daily for 4 days.   PARoxetine 10 MG tablet Commonly known as:  PAXIL Take 1 tablet (10 mg total) by mouth daily.   promethazine 25 MG tablet Commonly known as:  PHENERGAN Take 1 tablet (25 mg total) by mouth every 6 (six) hours as needed for nausea or vomiting.        Discharge Assessment: Vitals:   05/18/18 0628 05/18/18 0852  BP: (!) 159/84 (!) 153/86  Pulse: 77 70  Resp: 18   Temp: 98.8 F (37.1 C)   SpO2: 99% 99%   Skin clean, dry and intact without evidence of skin break down, no evidence of skin tears noted. IV catheter discontinued intact. Site without signs and symptoms of complications - no redness or edema noted at insertion site, patient denies c/o pain - only slight tenderness at site.  Dressing with slight pressure applied.  D/c Instructions-Education: Discharge instructions given to patient/family with verbalized understanding. D/c education completed with patient/family including follow up instructions, medication list, d/c activities limitations if indicated, with other d/c instructions as indicated by MD - patient able to verbalize understanding, all questions fully answered. Patient instructed to return to ED, call 911, or call MD for any changes in condition.  Patient escorted via [place], and D/C home via private auto.  Hiram Comber, RN 05/18/2018 9:47 AM

## 2018-05-18 NOTE — Discharge Instructions (Signed)
Follow with Primary MD Clent Demark, PA-C in 7 days   Get CBC, CMP checked  by Primary MD in 5-7 days   Activity: As tolerated with Full fall precautions use walker/cane & assistance as needed  Disposition Home   Diet: Heart Healthy    Special Instructions: If you have smoked or chewed Tobacco  in the last 2 yrs please stop smoking, stop any regular Alcohol  and or any Recreational drug use.  On your next visit with your primary care physician please Get Medicines reviewed and adjusted.  Please request your Prim.MD to go over all Hospital Tests and Procedure/Radiological results at the follow up, please get all Hospital records sent to your Prim MD by signing hospital release before you go home.  If you experience worsening of your admission symptoms, develop shortness of breath, life threatening emergency, suicidal or homicidal thoughts you must seek medical attention immediately by calling 911 or calling your MD immediately  if symptoms less severe.  You Must read complete instructions/literature along with all the possible adverse reactions/side effects for all the Medicines you take and that have been prescribed to you. Take any new Medicines after you have completely understood and accpet all the possible adverse reactions/side effects.

## 2018-05-22 ENCOUNTER — Emergency Department (HOSPITAL_COMMUNITY): Payer: No Typology Code available for payment source

## 2018-05-22 ENCOUNTER — Encounter (HOSPITAL_COMMUNITY): Payer: Self-pay | Admitting: Emergency Medicine

## 2018-05-22 ENCOUNTER — Other Ambulatory Visit: Payer: Self-pay

## 2018-05-22 ENCOUNTER — Emergency Department (HOSPITAL_COMMUNITY)
Admission: EM | Admit: 2018-05-22 | Discharge: 2018-05-22 | Disposition: A | Discharge status: Home / Self Care | Payer: No Typology Code available for payment source | Attending: Emergency Medicine | Admitting: Emergency Medicine

## 2018-05-22 DIAGNOSIS — Z7982 Long term (current) use of aspirin: Secondary | ICD-10-CM | POA: Insufficient documentation

## 2018-05-22 DIAGNOSIS — I10 Essential (primary) hypertension: Secondary | ICD-10-CM | POA: Insufficient documentation

## 2018-05-22 DIAGNOSIS — R112 Nausea with vomiting, unspecified: Secondary | ICD-10-CM | POA: Insufficient documentation

## 2018-05-22 DIAGNOSIS — Z7984 Long term (current) use of oral hypoglycemic drugs: Secondary | ICD-10-CM | POA: Insufficient documentation

## 2018-05-22 DIAGNOSIS — R079 Chest pain, unspecified: Secondary | ICD-10-CM | POA: Insufficient documentation

## 2018-05-22 DIAGNOSIS — E876 Hypokalemia: Secondary | ICD-10-CM | POA: Insufficient documentation

## 2018-05-22 DIAGNOSIS — E119 Type 2 diabetes mellitus without complications: Secondary | ICD-10-CM | POA: Insufficient documentation

## 2018-05-22 DIAGNOSIS — Z79899 Other long term (current) drug therapy: Secondary | ICD-10-CM | POA: Insufficient documentation

## 2018-05-22 DIAGNOSIS — R9431 Abnormal electrocardiogram [ECG] [EKG]: Secondary | ICD-10-CM | POA: Insufficient documentation

## 2018-05-22 DIAGNOSIS — R6883 Chills (without fever): Secondary | ICD-10-CM | POA: Insufficient documentation

## 2018-05-22 DIAGNOSIS — R197 Diarrhea, unspecified: Secondary | ICD-10-CM | POA: Insufficient documentation

## 2018-05-22 LAB — COMPREHENSIVE METABOLIC PANEL
ALT: 28 U/L (ref 0–44)
AST: 39 U/L (ref 15–41)
Albumin: 4 g/dL (ref 3.5–5.0)
Alkaline Phosphatase: 81 U/L (ref 38–126)
Anion gap: 12 (ref 5–15)
BUN: 17 mg/dL (ref 8–23)
CO2: 25 mmol/L (ref 22–32)
Calcium: 9.8 mg/dL (ref 8.9–10.3)
Chloride: 104 mmol/L (ref 98–111)
Creatinine, Ser: 0.96 mg/dL (ref 0.44–1.00)
GFR calc Af Amer: >60 mL/min (ref >60)
GFR calc non Af Amer: >60 mL/min (ref >60)
Glucose, Bld: 128 mg/dL — ABNORMAL HIGH (ref 70–99)
Potassium: 2.8 mmol/L — ABNORMAL LOW (ref 3.5–5.1)
Sodium: 141 mmol/L (ref 135–145)
Total Bilirubin: 0.8 mg/dL (ref 0.3–1.2)
Total Protein: 7.6 g/dL (ref 6.5–8.1)

## 2018-05-22 LAB — CBC WITH DIFFERENTIAL/PLATELET
Abs Immature Granulocytes: 0 10*3/uL (ref 0.00–0.07)
Basophils Absolute: 0.1 10*3/uL (ref 0.0–0.1)
Basophils Relative: 1 %
Eosinophils Absolute: 0 10*3/uL (ref 0.0–0.5)
Eosinophils Relative: 0 %
HCT: 46.4 % — ABNORMAL HIGH (ref 36.0–46.0)
Hemoglobin: 14.4 g/dL (ref 12.0–15.0)
Lymphocytes Relative: 39 %
Lymphs Abs: 4.2 10*3/uL — ABNORMAL HIGH (ref 0.7–4.0)
MCH: 26.3 pg (ref 26.0–34.0)
MCHC: 31 g/dL (ref 30.0–36.0)
MCV: 84.7 fL (ref 80.0–100.0)
Monocytes Absolute: 0.6 10*3/uL (ref 0.1–1.0)
Monocytes Relative: 6 %
Neutro Abs: 5.8 10*3/uL (ref 1.7–7.7)
Neutrophils Relative %: 54 %
Platelets: 431 10*3/uL — ABNORMAL HIGH (ref 150–400)
RBC: 5.48 MIL/uL — ABNORMAL HIGH (ref 3.87–5.11)
RDW: 12.3 % (ref 11.5–15.5)
WBC: 10.7 10*3/uL — ABNORMAL HIGH (ref 4.0–10.5)
nRBC: 0 % (ref 0.0–0.2)
nRBC: 0 /100 WBC

## 2018-05-22 LAB — URINALYSIS, ROUTINE W REFLEX MICROSCOPIC
Bilirubin Urine: NEGATIVE
Glucose, UA: NEGATIVE mg/dL
Ketones, ur: 5 mg/dL — AB
Leukocytes, UA: NEGATIVE
Nitrite: NEGATIVE
Protein, ur: NEGATIVE mg/dL
Specific Gravity, Urine: 1.019 (ref 1.005–1.030)
pH: 5 (ref 5.0–8.0)

## 2018-05-22 LAB — LIPASE, BLOOD: Lipase: 29 U/L (ref 11–51)

## 2018-05-22 LAB — I-STAT TROPONIN, ED: Troponin i, poc: 0 ng/mL (ref 0.00–0.08)

## 2018-05-22 LAB — I-STAT CG4 LACTIC ACID, ED: Lactic Acid, Venous: 1.87 mmol/L (ref 0.5–1.9)

## 2018-05-22 MED ORDER — POTASSIUM CHLORIDE CRYS ER 20 MEQ PO TBCR: 40.0000 meq | EXTENDED_RELEASE_TABLET | Freq: Two times a day (BID) | ORAL | 0 refills | Status: DC

## 2018-05-22 MED ORDER — ONDANSETRON 8 MG PO TBDP: 8.0000 mg | ORAL_TABLET | Freq: Three times a day (TID) | ORAL | 0 refills | Status: DC | PRN

## 2018-05-22 MED ORDER — ONDANSETRON HCL 4 MG/2ML IJ SOLN
4.0000 mg | Freq: Once | INTRAMUSCULAR | Status: DC
  Filled 2018-05-22: qty 2

## 2018-05-22 MED ORDER — SODIUM CHLORIDE 0.9 % IV BOLUS
1000.0000 mL | Freq: Once | INTRAVENOUS | Status: AC
  Administered 2018-05-22: 1000 mL via INTRAVENOUS

## 2018-05-22 MED ORDER — ASPIRIN 81 MG PO CHEW
324.0000 mg | CHEWABLE_TABLET | Freq: Once | ORAL | Status: AC
  Administered 2018-05-22: 324 mg via ORAL
  Filled 2018-05-22: qty 4

## 2018-05-22 MED ORDER — POTASSIUM CHLORIDE 10 MEQ/100ML IV SOLN
10.0000 meq | INTRAVENOUS | Status: AC
  Administered 2018-05-22 (×3): 10 meq via INTRAVENOUS
  Filled 2018-05-22 (×3): qty 100

## 2018-05-22 MED ORDER — POTASSIUM CHLORIDE CRYS ER 20 MEQ PO TBCR
40.0000 meq | EXTENDED_RELEASE_TABLET | Freq: Once | ORAL | Status: AC
  Administered 2018-05-22: 40 meq via ORAL
  Filled 2018-05-22: qty 2

## 2018-05-22 NOTE — Discharge Instructions (Signed)
Return if chest pain worsens Drink fluid as discussed Take oral potassium as prescribed.

## 2018-05-22 NOTE — ED Provider Notes (Signed)
[facility] EMERGENCY DEPARTMENT Provider Note   CSN: [id number] Arrival date & time: 05/22/18  0240     History   Chief Complaint Chief Complaint  Patient presents with  . Chest Pain  . Emesis    HPI [name] is a 62 y.o. female.  HPI  62 yo female ho t2dm, hypertension, d/c'd Sunday after admission for gastroeneteritis, and hypokalemia went home with phernergan.  States has continued to have nausea and vomiting with last emesis 0100 loc.  Some loose stools.  Decreased po but keeping some small amount of po down.  She had some chills but does not thinkshe has had fever.    Past Medical History:  Diagnosis Date  . Anemia    hx  . Arthritis    "all over" (06/19/2017)  . Diabetes mellitus without complication ([place])   . Hyperlipidemia   . Hypertension   . TIA (transient ischemic attack)    pt unaware of this hx on 06/19/2017    Patient Active Problem List   Diagnosis Date Noted  . Nausea and vomiting 05/17/2018  . Enteritis 06/19/2017  . Abdominal pain 06/18/2017  . Hyperlipidemia 06/18/2017  . Elevated glucose 06/18/2017  . Need for hepatitis C screening test 05/23/2017  . Breast pain, left 01/05/2016  . Left ankle sprain 01/20/2015  . Gastroenteritis 11/08/2014  . Malaise 10/14/2014  . Hypokalemia 10/14/2014  . Essential hypertension   . Bilateral wrist pain 10/06/2014  . Hot flashes, menopausal 10/06/2014  . Cerebral aneurysm without rupture 08/20/2014  . TIA (transient ischemic attack) 08/19/2014  . Accelerated hypertension 02/24/2013  . Depression 02/24/2013    Past Surgical History:  Procedure Laterality Date  . ANTERIOR CERVICAL DECOMP/DISCECTOMY FUSION    . BACK SURGERY    . CESAREAN SECTION  1986  . IR GENERIC HISTORICAL  09/21/2014   IR ANGIO INTRA EXTRACRAN SEL INTERNAL CAROTID UNI L MOD SED 09/21/2014 [name], MD [facility]  . IR GENERIC HISTORICAL  09/21/2014   IR ANGIO INTRA EXTRACRAN SEL COM CAROTID  INNOMINATE UNI R MOD SED 09/21/2014 [name], MD [facility]  . IR GENERIC HISTORICAL  09/21/2014   IR ANGIO VERTEBRAL SEL VERTEBRAL UNI L MOD SED 09/21/2014 [name], MD [facility]  . IR GENERIC HISTORICAL  09/21/2014   IR 3D INDEPENDENT WKST 09/21/2014 [name], MD [facility]  . LAPAROSCOPIC CHOLECYSTECTOMY    . TUBAL LIGATION       OB History    Gravida  2   Para  1   Term  1   Preterm      AB  1   Living  1     SAB  1   TAB      Ectopic      Multiple      Live Births  1            Home Medications    Prior to Admission medications   Medication Sig Start Date End Date Taking? Authorizing Provider  ACCU-CHEK GUIDE test strip USE UP TO TWICE DAILY AS DIRECTED 08/30/17   Clent Demark, PA-C  amLODipine (NORVASC) 10 MG tablet Take 1 tablet (10 mg total) by mouth daily. 07/01/17   Clent Demark, PA-C  amoxicillin-clavulanate (AUGMENTIN) 875-125 MG tablet Take 1 tablet by mouth 2 (two) times daily for 4 days. 05/18/18 05/22/18  Thurnell Lose, MD  aspirin EC 81 MG tablet Take 1 tablet (81 mg total) by mouth  daily. 02/01/16   Boykin Nearing, MD  atorvastatin (LIPITOR) 40 MG tablet Take 1 tablet (40 mg total) daily by mouth. 04/29/17   Clent Demark, PA-C  blood glucose meter kit and supplies KIT Dispense based on patient and insurance preference. Use up to twice daily as directed. (FOR ICD-9 250.00, 250.01). 07/01/17   Clent Demark, PA-C  feeding supplement, ENSURE ENLIVE, (ENSURE ENLIVE) LIQD Take 237 mLs by mouth 3 (three) times daily between meals. 07/01/17   Clent Demark, PA-C  lisinopril-hydrochlorothiazide (ZESTORETIC) 10-12.5 MG tablet Take 2 tablets by mouth daily. 07/01/17   Clent Demark, PA-C  loperamide (IMODIUM A-D) 2 MG tablet Take 1 tablet (2 mg total) by mouth 4 (four) times daily as needed for diarrhea or loose stools. 05/18/18   Thurnell Lose, MD  metFORMIN (GLUCOPHAGE XR) 500 MG 24 hr tablet  Take 1 tablet (500 mg total) by mouth daily with breakfast. 07/01/17   Clent Demark, PA-C  metoprolol succinate (TOPROL-XL) 25 MG 24 hr tablet Take 2 tablets (50 mg total) by mouth daily. 07/01/17   Clent Demark, PA-C  PARoxetine (PAXIL) 10 MG tablet Take 1 tablet (10 mg total) by mouth daily. Patient not taking: Reported on 05/17/2018 07/01/17   Clent Demark, PA-C  promethazine (PHENERGAN) 25 MG tablet Take 1 tablet (25 mg total) by mouth every 6 (six) hours as needed for nausea or vomiting. 05/18/18   Thurnell Lose, MD    Family History Family History  Problem Relation Age of Onset  . Hypertension Mother   . Arthritis Mother   . Diabetes Mother   . Heart disease Father   . Diabetes Sister   . Diabetes Brother   . Diabetes Sister   . Cancer Neg Hx     Social History Social History   Tobacco Use  . Smoking status: Never Smoker  . Smokeless tobacco: Never Used  Substance Use Topics  . Alcohol use: Yes    Comment: socially   . Drug use: No     Allergies   Patient has no known allergies.   Review of Systems Review of Systems  All other systems reviewed and are negative.    Physical Exam Updated Vital Signs BP (!) 156/94 (BP Location: Right Arm)   Pulse (!) 101   Temp 97.8 F (36.6 C) (Oral)   Resp 11   Ht 1.575 m (5' 2")   Wt 50 kg   SpO2 100%   BMI 20.16 kg/m   Physical Exam  Constitutional: She is oriented to person, place, and time. She appears well-developed and well-nourished. She does not appear ill.  HENT:  Head: Normocephalic and atraumatic.  Eyes: Pupils are equal, round, and reactive to light. EOM are normal.  Neck: Normal range of motion. Neck supple.  Cardiovascular: Tachycardia present.  Pulmonary/Chest: Effort normal and breath sounds normal.  Abdominal: Soft. Bowel sounds are normal. There is no tenderness.  Musculoskeletal: Normal range of motion. She exhibits no edema.  Neurological: She is alert and oriented to person,  place, and time. No cranial nerve deficit. Coordination normal.  Skin: Skin is warm and dry. Capillary refill takes less than 2 seconds.  Psychiatric: She has a normal mood and affect.  Nursing note and vitals reviewed.    ED Treatments / Results  Labs (all labs ordered are listed, but only abnormal results are displayed) Labs Reviewed  CULTURE, BLOOD (ROUTINE X 2)  CULTURE, BLOOD (ROUTINE X 2)  CBC  WITH DIFFERENTIAL/PLATELET  COMPREHENSIVE METABOLIC PANEL  I-STAT CG4 LACTIC ACID, ED  I-STAT TROPONIN, ED    EKG None  Radiology No results found.  Procedures Procedures (including critical care time)  Medications Ordered in ED Medications  sodium chloride 0.9 % bolus 1,000 mL (has no administration in time range)  aspirin chewable tablet 324 mg (has no administration in time range)     Initial Impression / Assessment and Plan / ED Course  I have reviewed the triage vital signs and the nursing notes.  Pertinent labs & imaging results that were available during my care of the patient were reviewed by me and considered in my medical decision making (see chart for details).     1-nausea/vomiting/ and diarrhea-diarrhea last night last episode of vomiting at 1 AM.  Patient has received fluid hydration here with anti-medics and feels improved. 2 hypokalemia patient with hypokalemia likely secondary #1.  She has had repletion here both IV and orally.  Plan outpatient continued oral repletion 3 chest discomfort patient with nonspecific T wave changes here on EKG.  She has had chest discomfort since nausea and vomiting symptoms began last Saturday.  She has a negative troponin and her T wave inversion has decreased from her previous EKG.  I do not think that she is having acute ischemia.  She is advised regarding return precautions and need for need for close follow-up and voices understanding. Final Clinical Impressions(s) / ED Diagnoses   Final diagnoses:  Nausea vomiting and  diarrhea  Hypokalemia  Abnormal EKG    ED Discharge Orders         Ordered    ondansetron (ZOFRAN ODT) 8 MG disintegrating tablet  Every 8 hours PRN     05/22/18 1212    potassium chloride SA (K-DUR,KLOR-CON) 20 MEQ tablet  2 times daily     05/22/18 1212           Pattricia Boss, MD 05/22/18 1213

## 2018-05-22 NOTE — ED Notes (Signed)
Pt ambulatory to bathroom with no issues.  

## 2018-05-22 NOTE — ED Triage Notes (Signed)
Pt has been feeling weak and nauseous  since she left the hospital on 11/23, pt reports she is unable to keep any food down. Pt reports central CP since yesterday that feels like soreness from throwing up. Pt threw up once this morning.

## 2018-05-22 NOTE — ED Notes (Addendum)
Pt given ginger ale and water, denies any current nausea.

## 2018-05-27 LAB — CULTURE, BLOOD (ROUTINE X 2)
Culture: NO GROWTH
Culture: NO GROWTH
Special Requests: ADEQUATE
Special Requests: ADEQUATE

## 2018-11-07 ENCOUNTER — Ambulatory Visit (HOSPITAL_COMMUNITY)
Admission: EM | Admit: 2018-11-07 | Discharge: 2018-11-07 | Disposition: A | Discharge status: Home / Self Care | Payer: No Typology Code available for payment source | Attending: Family Medicine | Admitting: Family Medicine

## 2018-11-07 ENCOUNTER — Other Ambulatory Visit: Payer: Self-pay

## 2018-11-07 ENCOUNTER — Encounter (HOSPITAL_COMMUNITY): Payer: Self-pay

## 2018-11-07 DIAGNOSIS — S161XXA Strain of muscle, fascia and tendon at neck level, initial encounter: Secondary | ICD-10-CM | POA: Diagnosis not present

## 2018-11-07 DIAGNOSIS — I10 Essential (primary) hypertension: Secondary | ICD-10-CM

## 2018-11-07 DIAGNOSIS — B349 Viral infection, unspecified: Secondary | ICD-10-CM

## 2018-11-07 DIAGNOSIS — R05 Cough: Secondary | ICD-10-CM

## 2018-11-07 DIAGNOSIS — R059 Cough, unspecified: Secondary | ICD-10-CM

## 2018-11-07 DIAGNOSIS — H6123 Impacted cerumen, bilateral: Secondary | ICD-10-CM

## 2018-11-07 MED ORDER — IBUPROFEN 600 MG PO TABS: 600.0000 mg | ORAL_TABLET | Freq: Four times a day (QID) | ORAL | 0 refills | Status: DC | PRN

## 2018-11-07 MED ORDER — CYCLOBENZAPRINE HCL 5 MG PO TABS: 5.0000 mg | ORAL_TABLET | Freq: Two times a day (BID) | ORAL | 0 refills | Status: DC | PRN

## 2018-11-07 MED ORDER — BENZONATATE 200 MG PO CAPS: 200.0000 mg | ORAL_CAPSULE | Freq: Three times a day (TID) | ORAL | 0 refills | Status: AC | PRN

## 2018-11-07 NOTE — Discharge Instructions (Signed)
Neck/back Pain Use anti-inflammatories for pain/swelling. You may take up to 800 mg Ibuprofen every 8 hours with food. You may supplement Ibuprofen with Tylenol [phone] every 8 hours.   You may use flexeril as needed to help with pain. This is a muscle relaxer and causes sedation- please use only at bedtime or when you will be home and not have to drive/work  If developing increased neck stiffness/neck pain and fever returning please follow-up in emergency room  Cough Tessalon as needed for cough every 8 hours Rest Drink Plenty of fluids  If cough/fever worsening follow up here; consider COVID testing For COVID testing may call [facility] Department to make an appointment [phone]

## 2018-11-07 NOTE — ED Provider Notes (Signed)
[facility]    CSN: [id number] Arrival date & time: 11/07/18  2409     History   Chief Complaint Chief Complaint  Patient presents with  . Fever    HPI [name] is a 63 y.o. female history of hypertension, hyperlipidemia, DM type II, presenting today for evaluation of cough, fever and neck/back pain.  Patient states that on Wednesday she began to have chills.  The chills resolved and on Thursday was noted to have a fever.  Notes her temperature was up to 101.  She is not taking anything for her fever.  Last night and into today she has had neck and back discomfort.  Pain with turning and difficulty getting comfortable at nighttime.  She has not taken any Tylenol or ibuprofen today.  She is also noted a mild cough over the past 2 days, feels cough is been better today.  She has not taken over-the-counter medicines for cough.  Denies associated congestion or sore throat.  Denies close contacts with similar symptoms or potential COVID.  She does note that she lives with her elderly mother as well as her daughter who has a disability.  She also feels as if she has been increasingly stressed and slightly over on.  She denies any nausea vomiting or diarrhea.  Denies chest pain or shortness of breath.  HPI  Past Medical History:  Diagnosis Date  . Anemia    hx  . Arthritis    "all over" (06/19/2017)  . Diabetes mellitus without complication ([place])   . Hyperlipidemia   . Hypertension   . TIA (transient ischemic attack)    pt unaware of this hx on 06/19/2017    Patient Active Problem List   Diagnosis Date Noted  . Nausea and vomiting 05/17/2018  . Enteritis 06/19/2017  . Abdominal pain 06/18/2017  . Hyperlipidemia 06/18/2017  . Elevated glucose 06/18/2017  . Need for hepatitis C screening test 05/23/2017  . Breast pain, left 01/05/2016  . Left ankle sprain 01/20/2015  . Gastroenteritis 11/08/2014  . Malaise 10/14/2014  . Hypokalemia 10/14/2014  . Essential  hypertension   . Bilateral wrist pain 10/06/2014  . Hot flashes, menopausal 10/06/2014  . Cerebral aneurysm without rupture 08/20/2014  . TIA (transient ischemic attack) 08/19/2014  . Accelerated hypertension 02/24/2013  . Depression 02/24/2013    Past Surgical History:  Procedure Laterality Date  . ANTERIOR CERVICAL DECOMP/DISCECTOMY FUSION    . BACK SURGERY    . CESAREAN SECTION  1986  . IR GENERIC HISTORICAL  09/21/2014   IR ANGIO INTRA EXTRACRAN SEL INTERNAL CAROTID UNI L MOD SED 09/21/2014 [name], MD [facility]  . IR GENERIC HISTORICAL  09/21/2014   IR ANGIO INTRA EXTRACRAN SEL COM CAROTID INNOMINATE UNI R MOD SED 09/21/2014 [name], MD [facility]  . IR GENERIC HISTORICAL  09/21/2014   IR ANGIO VERTEBRAL SEL VERTEBRAL UNI L MOD SED 09/21/2014 [name], MD [facility]  . IR GENERIC HISTORICAL  09/21/2014   IR 3D INDEPENDENT WKST 09/21/2014 [name], MD [facility]  . LAPAROSCOPIC CHOLECYSTECTOMY    . TUBAL LIGATION      OB History    Gravida  2   Para  1   Term  1   Preterm      AB  1   Living  1     SAB  1   TAB      Ectopic      Multiple  Live Births  1            Home Medications    Prior to Admission medications   Medication Sig Start Date End Date Taking? Authorizing Provider  ACCU-CHEK GUIDE test strip USE UP TO TWICE DAILY AS DIRECTED 08/30/17   Clent Demark, PA-C  amLODipine (NORVASC) 10 MG tablet Take 1 tablet (10 mg total) by mouth daily. 07/01/17   Clent Demark, PA-C  aspirin EC 81 MG tablet Take 1 tablet (81 mg total) by mouth daily. 02/01/16   Funches, Adriana Mccallum, MD  atorvastatin (LIPITOR) 40 MG tablet Take 1 tablet (40 mg total) daily by mouth. 04/29/17   Clent Demark, PA-C  benzonatate (TESSALON) 200 MG capsule Take 1 capsule (200 mg total) by mouth 3 (three) times daily as needed for up to 7 days for cough. 11/07/18 11/14/18  Wieters, Hallie C, PA-C  blood glucose meter kit and  supplies KIT Dispense based on patient and insurance preference. Use up to twice daily as directed. (FOR ICD-9 250.00, 250.01). 07/01/17   Clent Demark, PA-C  cyclobenzaprine (FLEXERIL) 5 MG tablet Take 1-2 tablets (5-10 mg total) by mouth 2 (two) times daily as needed for muscle spasms. 11/07/18   Wieters, Hallie C, PA-C  feeding supplement, ENSURE ENLIVE, (ENSURE ENLIVE) LIQD Take 237 mLs by mouth 3 (three) times daily between meals. 07/01/17   Clent Demark, PA-C  ibuprofen (ADVIL) 600 MG tablet Take 1 tablet (600 mg total) by mouth every 6 (six) hours as needed. 11/07/18   Wieters, Hallie C, PA-C  lisinopril-hydrochlorothiazide (ZESTORETIC) 10-12.5 MG tablet Take 2 tablets by mouth daily. 07/01/17   Clent Demark, PA-C  loperamide (IMODIUM A-D) 2 MG tablet Take 1 tablet (2 mg total) by mouth 4 (four) times daily as needed for diarrhea or loose stools. 05/18/18   Thurnell Lose, MD  metFORMIN (GLUCOPHAGE XR) 500 MG 24 hr tablet Take 1 tablet (500 mg total) by mouth daily with breakfast. 07/01/17   Clent Demark, PA-C  metoprolol succinate (TOPROL-XL) 25 MG 24 hr tablet Take 2 tablets (50 mg total) by mouth daily. 07/01/17   Clent Demark, PA-C  ondansetron (ZOFRAN ODT) 8 MG disintegrating tablet Take 1 tablet (8 mg total) by mouth every 8 (eight) hours as needed for nausea or vomiting. 05/22/18   Pattricia Boss, MD  PARoxetine (PAXIL) 10 MG tablet Take 1 tablet (10 mg total) by mouth daily. 07/01/17   Clent Demark, PA-C  potassium chloride SA (K-DUR,KLOR-CON) 20 MEQ tablet Take 2 tablets (40 mEq total) by mouth 2 (two) times daily. 05/22/18   Pattricia Boss, MD  promethazine (PHENERGAN) 25 MG tablet Take 1 tablet (25 mg total) by mouth every 6 (six) hours as needed for nausea or vomiting. 05/18/18   Thurnell Lose, MD    Family History Family History  Problem Relation Age of Onset  . Hypertension Mother   . Arthritis Mother   . Diabetes Mother   . Heart disease Father    . Diabetes Sister   . Diabetes Brother   . Diabetes Sister   . Cancer Neg Hx     Social History Social History   Tobacco Use  . Smoking status: Never Smoker  . Smokeless tobacco: Never Used  Substance Use Topics  . Alcohol use: Yes    Comment: socially   . Drug use: No     Allergies   Patient has no known allergies.   Review of  Systems Review of Systems  Constitutional: Positive for chills and fever. Negative for activity change, appetite change and fatigue.  HENT: Negative for congestion, ear pain, rhinorrhea, sinus pressure, sore throat and trouble swallowing.   Eyes: Negative for discharge and redness.  Respiratory: Positive for cough. Negative for chest tightness and shortness of breath.   Cardiovascular: Negative for chest pain.  Gastrointestinal: Negative for abdominal pain, diarrhea, nausea and vomiting.  Musculoskeletal: Positive for back pain and myalgias.  Skin: Negative for rash.  Neurological: Negative for dizziness, light-headedness and headaches.     Physical Exam Triage Vital Signs ED Triage Vitals  Enc Vitals Group     BP 11/07/18 1024 (!) 152/91     Pulse Rate 11/07/18 1024 88     Resp 11/07/18 1024 18     Temp 11/07/18 1024 98.2 F (36.8 C)     Temp Source 11/07/18 1024 Oral     SpO2 11/07/18 1024 100 %     Weight --      Height --      Head Circumference --      Peak Flow --      Pain Score 11/07/18 1025 6     Pain Loc --      Pain Edu? --      Excl. in [place]? --    No data found.  Updated Vital Signs BP (!) 152/91 (BP Location: Right Arm)   Pulse 88   Temp 98.2 F (36.8 C) (Oral)   Resp 18   SpO2 100%   Visual Acuity Right Eye Distance:   Left Eye Distance:   Bilateral Distance:    Right Eye Near:   Left Eye Near:    Bilateral Near:     Physical Exam Vitals signs and nursing note reviewed.  Constitutional:      General: She is not in acute distress.    Appearance: She is well-developed.     Comments: Sitting  comfortably on exam table  HENT:     Head: Normocephalic and atraumatic.     Ears:     Comments: Bilateral cerumen impaction, TMs not visualized    Nose:     Comments: Nasal mucosa pink, nonswollen turbinates, no rhinorrhea    Mouth/Throat:     Comments: Oral mucosa pink and moist, no tonsillar enlargement or exudate. Posterior pharynx patent and nonerythematous, no uvula deviation or swelling. Normal phonation. Eyes:     Conjunctiva/sclera: Conjunctivae normal.  Neck:     Musculoskeletal: Neck supple.     Comments: Full active range of motion of neck, tenderness throughout bilateral trapezius, cervical musculature, nontender midline, negative Kernig and Brudzinski Cardiovascular:     Rate and Rhythm: Normal rate and regular rhythm.     Heart sounds: No murmur.  Pulmonary:     Effort: Pulmonary effort is normal. No respiratory distress.     Breath sounds: Normal breath sounds.     Comments: Breathing comfortably at rest, CTABL, no wheezing, rales or other adventitious sounds auscultated  Occasional cough with deep inspiration Abdominal:     Palpations: Abdomen is soft.     Tenderness: There is no abdominal tenderness.  Skin:    General: Skin is warm and dry.  Neurological:     Mental Status: She is alert.      UC Treatments / Results  Labs (all labs ordered are listed, but only abnormal results are displayed) Labs Reviewed - No data to display  EKG None  Radiology No results found.  Procedures Procedures (including critical care time)  Medications Ordered in UC Medications - No data to display  Initial Impression / Assessment and Plan / UC Course  I have reviewed the triage vital signs and the nursing notes.  Pertinent labs & imaging results that were available during my care of the patient were reviewed by me and considered in my medical decision making (see chart for details).     Vital signs stable today without fever, tachycardia or hypoxia.  2 days of  intermittent fevers, myalgias and cough.  No nuchal rigidity today.  Given stable vital signs feel this is less likely meningitis.  Will have patient monitor neck pain and temperature and if symptoms persisting or worsening to follow-up in ED.  Will treat neck and back pain with anti-inflammatories and muscle relaxers for now.  Lungs clear, do not suspect underlying pneumonia.  Cannot rule out COVID.  Tessalon as needed for cough.  Continue to rest and push fluids.  Advised to follow-up if symptoms worsening or changing.Discussed strict return precautions. Patient verbalized understanding and is agreeable with plan.  Final Clinical Impressions(s) / UC Diagnoses   Final diagnoses:  Strain of neck muscle, initial encounter  Cough  Viral illness     Discharge Instructions     Neck/back Pain Use anti-inflammatories for pain/swelling. You may take up to 800 mg Ibuprofen every 8 hours with food. You may supplement Ibuprofen with Tylenol [phone] mg every 8 hours.   You may use flexeril as needed to help with pain. This is a muscle relaxer and causes sedation- please use only at bedtime or when you will be home and not have to drive/work  If developing increased neck stiffness/neck pain and fever returning please follow-up in emergency room  Cough Tessalon as needed for cough every 8 hours Rest Drink Plenty of fluids  If cough/fever worsening follow up here; consider COVID testing For COVID testing may call [facility] Department to make an appointment [phone]    ED Prescriptions    Medication Sig Dispense Auth. Provider   cyclobenzaprine (FLEXERIL) 5 MG tablet Take 1-2 tablets (5-10 mg total) by mouth 2 (two) times daily as needed for muscle spasms. 24 tablet Wieters, Hallie C, PA-C   benzonatate (TESSALON) 200 MG capsule Take 1 capsule (200 mg total) by mouth 3 (three) times daily as needed for up to 7 days for cough. 28 capsule Wieters, Hallie C, PA-C   ibuprofen (ADVIL) 600 MG  tablet Take 1 tablet (600 mg total) by mouth every 6 (six) hours as needed. 30 tablet Wieters, Bonney C, PA-C     Controlled Substance Prescriptions [place] Controlled Substance Registry consulted? Not Applicable   Janith Lima, [place] 11/07/18 1113

## 2018-11-07 NOTE — ED Triage Notes (Signed)
Pt c/o dry cough, fever, neck/shoulder pain x2 days

## 2019-03-07 ENCOUNTER — Encounter (HOSPITAL_COMMUNITY): Payer: Self-pay

## 2019-03-07 ENCOUNTER — Emergency Department (HOSPITAL_COMMUNITY)
Admission: EM | Admit: 2019-03-07 | Discharge: 2019-03-07 | Disposition: A | Discharge status: Home / Self Care | Payer: No Typology Code available for payment source | Attending: Emergency Medicine | Admitting: Emergency Medicine

## 2019-03-07 ENCOUNTER — Other Ambulatory Visit: Payer: Self-pay

## 2019-03-07 DIAGNOSIS — Z794 Long term (current) use of insulin: Secondary | ICD-10-CM | POA: Insufficient documentation

## 2019-03-07 DIAGNOSIS — R197 Diarrhea, unspecified: Secondary | ICD-10-CM | POA: Insufficient documentation

## 2019-03-07 DIAGNOSIS — E119 Type 2 diabetes mellitus without complications: Secondary | ICD-10-CM | POA: Insufficient documentation

## 2019-03-07 DIAGNOSIS — Z79899 Other long term (current) drug therapy: Secondary | ICD-10-CM | POA: Insufficient documentation

## 2019-03-07 DIAGNOSIS — Z7982 Long term (current) use of aspirin: Secondary | ICD-10-CM | POA: Insufficient documentation

## 2019-03-07 DIAGNOSIS — I10 Essential (primary) hypertension: Secondary | ICD-10-CM | POA: Insufficient documentation

## 2019-03-07 DIAGNOSIS — R112 Nausea with vomiting, unspecified: Secondary | ICD-10-CM | POA: Insufficient documentation

## 2019-03-07 DIAGNOSIS — R51 Headache: Secondary | ICD-10-CM | POA: Insufficient documentation

## 2019-03-07 DIAGNOSIS — R1013 Epigastric pain: Secondary | ICD-10-CM | POA: Insufficient documentation

## 2019-03-07 DIAGNOSIS — R05 Cough: Secondary | ICD-10-CM | POA: Insufficient documentation

## 2019-03-07 LAB — COMPREHENSIVE METABOLIC PANEL
ALT: 18 U/L (ref 0–44)
AST: 22 U/L (ref 15–41)
Albumin: 4.1 g/dL (ref 3.5–5.0)
Alkaline Phosphatase: 82 U/L (ref 38–126)
Anion gap: 10 (ref 5–15)
BUN: 15 mg/dL (ref 8–23)
CO2: 26 mmol/L (ref 22–32)
Calcium: 9.4 mg/dL (ref 8.9–10.3)
Chloride: 106 mmol/L (ref 98–111)
Creatinine, Ser: 0.65 mg/dL (ref 0.44–1.00)
GFR calc Af Amer: >60 mL/min (ref >60)
GFR calc non Af Amer: >60 mL/min (ref >60)
Glucose, Bld: 111 mg/dL — ABNORMAL HIGH (ref 70–99)
Potassium: 3.8 mmol/L (ref 3.5–5.1)
Sodium: 142 mmol/L (ref 135–145)
Total Bilirubin: 0.5 mg/dL (ref 0.3–1.2)
Total Protein: 7.8 g/dL (ref 6.5–8.1)

## 2019-03-07 LAB — LIPASE, BLOOD: Lipase: 28 U/L (ref 11–51)

## 2019-03-07 LAB — CBC WITH DIFFERENTIAL/PLATELET
Abs Immature Granulocytes: 0.06 10*3/uL (ref 0.00–0.07)
Basophils Absolute: 0.1 10*3/uL (ref 0.0–0.1)
Basophils Relative: 1 %
Eosinophils Absolute: 0 10*3/uL (ref 0.0–0.5)
Eosinophils Relative: 0 %
HCT: 41.2 % (ref 36.0–46.0)
Hemoglobin: 12.8 g/dL (ref 12.0–15.0)
Immature Granulocytes: 1 %
Lymphocytes Relative: 10 %
Lymphs Abs: 1.1 10*3/uL (ref 0.7–4.0)
MCH: 27.5 pg (ref 26.0–34.0)
MCHC: 31.1 g/dL (ref 30.0–36.0)
MCV: 88.4 fL (ref 80.0–100.0)
Monocytes Absolute: 0.3 10*3/uL (ref 0.1–1.0)
Monocytes Relative: 3 %
Neutro Abs: 9.8 10*3/uL — ABNORMAL HIGH (ref 1.7–7.7)
Neutrophils Relative %: 85 %
Platelets: 316 10*3/uL (ref 150–400)
RBC: 4.66 MIL/uL (ref 3.87–5.11)
RDW: 13.4 % (ref 11.5–15.5)
WBC: 11.3 10*3/uL — ABNORMAL HIGH (ref 4.0–10.5)
nRBC: 0 % (ref 0.0–0.2)

## 2019-03-07 MED ORDER — ONDANSETRON 4 MG PO TBDP: ORAL_TABLET | ORAL | 0 refills | Status: DC

## 2019-03-07 MED ORDER — ALUM & MAG HYDROXIDE-SIMETH 200-200-20 MG/5ML PO SUSP
30.0000 mL | Freq: Once | ORAL | Status: AC
  Administered 2019-03-07: 30 mL via ORAL
  Filled 2019-03-07: qty 30

## 2019-03-07 MED ORDER — ONDANSETRON HCL 4 MG/2ML IJ SOLN
4.0000 mg | Freq: Once | INTRAMUSCULAR | Status: AC
  Administered 2019-03-07: 4 mg via INTRAVENOUS
  Filled 2019-03-07: qty 2

## 2019-03-07 MED ORDER — SODIUM CHLORIDE 0.9 % IV BOLUS
1000.0000 mL | Freq: Once | INTRAVENOUS | Status: AC
  Administered 2019-03-07: 1000 mL via INTRAVENOUS

## 2019-03-07 MED ORDER — MORPHINE SULFATE (PF) 2 MG/ML IV SOLN
2.0000 mg | Freq: Once | INTRAVENOUS | Status: AC
  Administered 2019-03-07: 2 mg via INTRAVENOUS
  Filled 2019-03-07: qty 1

## 2019-03-07 NOTE — ED Provider Notes (Signed)
[facility] DEPT Provider Note   CSN: [id number] Arrival date & time: 03/07/19  0258     History   Chief Complaint Chief Complaint  Patient presents with  . Emesis    HPI [name] is a 63 y.o. female.     63 yo F with a chief complaint of nausea vomiting and diarrhea.  Going on for about 4 hours now.  No fevers.  No sick contacts no suspicious food intake.  Patient has had recurrent issues like this.  Was admitted about a year ago for something similar.  She is concerned that this may get worse and so came to the ED for evaluation.  No shortness of breath.  Cough off and on.  Had a headache a couple days ago but resolved.  The history is provided by the patient.  Emesis Severity:  Moderate Duration:  2 days Timing:  Constant Quality:  Stomach contents Progression:  Unchanged Chronicity:  New Recent urination:  Normal Relieved by:  Nothing Worsened by:  Nothing Ineffective treatments:  None tried Associated symptoms: abdominal pain and diarrhea   Associated symptoms: no arthralgias, no chills, no fever, no headaches and no myalgias     Past Medical History:  Diagnosis Date  . Anemia    hx  . Arthritis    "all over" (06/19/2017)  . Diabetes mellitus without complication ([place])   . Hyperlipidemia   . Hypertension   . TIA (transient ischemic attack)    pt unaware of this hx on 06/19/2017    Patient Active Problem List   Diagnosis Date Noted  . Nausea and vomiting 05/17/2018  . Enteritis 06/19/2017  . Abdominal pain 06/18/2017  . Hyperlipidemia 06/18/2017  . Elevated glucose 06/18/2017  . Need for hepatitis C screening test 05/23/2017  . Breast pain, left 01/05/2016  . Left ankle sprain 01/20/2015  . Gastroenteritis 11/08/2014  . Malaise 10/14/2014  . Hypokalemia 10/14/2014  . Essential hypertension   . Bilateral wrist pain 10/06/2014  . Hot flashes, menopausal 10/06/2014  . Cerebral aneurysm without rupture  08/20/2014  . TIA (transient ischemic attack) 08/19/2014  . Accelerated hypertension 02/24/2013  . Depression 02/24/2013    Past Surgical History:  Procedure Laterality Date  . ANTERIOR CERVICAL DECOMP/DISCECTOMY FUSION    . BACK SURGERY    . CESAREAN SECTION  1986  . IR GENERIC HISTORICAL  09/21/2014   IR ANGIO INTRA EXTRACRAN SEL INTERNAL CAROTID UNI L MOD SED 09/21/2014 [name], MD [facility]  . IR GENERIC HISTORICAL  09/21/2014   IR ANGIO INTRA EXTRACRAN SEL COM CAROTID INNOMINATE UNI R MOD SED 09/21/2014 [name], MD [facility]  . IR GENERIC HISTORICAL  09/21/2014   IR ANGIO VERTEBRAL SEL VERTEBRAL UNI L MOD SED 09/21/2014 [name], MD [facility]  . IR GENERIC HISTORICAL  09/21/2014   IR 3D INDEPENDENT WKST 09/21/2014 [name], MD [facility]  . LAPAROSCOPIC CHOLECYSTECTOMY    . TUBAL LIGATION       OB History    Gravida  2   Para  1   Term  1   Preterm      AB  1   Living  1     SAB  1   TAB      Ectopic      Multiple      Live Births  1            Home Medications    Prior to Admission  medications   Medication Sig Start Date End Date Taking? Authorizing Provider  amLODipine (NORVASC) 10 MG tablet Take 1 tablet (10 mg total) by mouth daily. 07/01/17  Yes Clent Demark, PA-C  aspirin EC 81 MG tablet Take 1 tablet (81 mg total) by mouth daily. 02/01/16  Yes Funches, Josalyn, MD  atorvastatin (LIPITOR) 40 MG tablet Take 1 tablet (40 mg total) daily by mouth. 04/29/17  Yes Clent Demark, PA-C  cyclobenzaprine (FLEXERIL) 5 MG tablet Take 1-2 tablets (5-10 mg total) by mouth 2 (two) times daily as needed for muscle spasms. 11/07/18  Yes Wieters, Hallie C, PA-C  feeding supplement, ENSURE ENLIVE, (ENSURE ENLIVE) LIQD Take 237 mLs by mouth 3 (three) times daily between meals. 07/01/17  Yes Clent Demark, PA-C  ibuprofen (ADVIL) 600 MG tablet Take 1 tablet (600 mg total) by mouth every 6 (six) hours as needed.  Patient taking differently: Take 600 mg by mouth every 6 (six) hours as needed for moderate pain.  11/07/18  Yes Wieters, Hallie C, PA-C  lisinopril-hydrochlorothiazide (ZESTORETIC) 10-12.5 MG tablet Take 2 tablets by mouth daily. 07/01/17  Yes Clent Demark, PA-C  loperamide (IMODIUM A-D) 2 MG tablet Take 1 tablet (2 mg total) by mouth 4 (four) times daily as needed for diarrhea or loose stools. 05/18/18  Yes Thurnell Lose, MD  metFORMIN (GLUCOPHAGE XR) 500 MG 24 hr tablet Take 1 tablet (500 mg total) by mouth daily with breakfast. 07/01/17  Yes Clent Demark, PA-C  metoprolol succinate (TOPROL-XL) 25 MG 24 hr tablet Take 2 tablets (50 mg total) by mouth daily. 07/01/17  Yes Clent Demark, PA-C  PARoxetine (PAXIL) 10 MG tablet Take 1 tablet (10 mg total) by mouth daily. 07/01/17  Yes Clent Demark, PA-C  potassium chloride SA (K-DUR,KLOR-CON) 20 MEQ tablet Take 2 tablets (40 mEq total) by mouth 2 (two) times daily. 05/22/18  Yes Pattricia Boss, MD  ACCU-CHEK GUIDE test strip USE UP TO TWICE DAILY AS DIRECTED 08/30/17   Clent Demark, PA-C  blood glucose meter kit and supplies KIT Dispense based on patient and insurance preference. Use up to twice daily as directed. (FOR ICD-9 250.00, 250.01). 07/01/17   Clent Demark, PA-C  ondansetron [facility] ODT) 4 MG disintegrating tablet 41m ODT q4 hours prn nausea/vomit 03/07/19   FDeno Etienne DO  promethazine (PHENERGAN) 25 MG tablet Take 1 tablet (25 mg total) by mouth every 6 (six) hours as needed for nausea or vomiting. Patient not taking: Reported on 03/07/2019 05/18/18   SThurnell Lose MD    Family History Family History  Problem Relation Age of Onset  . Hypertension Mother   . Arthritis Mother   . Diabetes Mother   . Heart disease Father   . Diabetes Sister   . Diabetes Brother   . Diabetes Sister   . Cancer Neg Hx     Social History Social History   Tobacco Use  . Smoking status: Never Smoker  . Smokeless tobacco:  Never Used  Substance Use Topics  . Alcohol use: Yes    Comment: socially   . Drug use: No     Allergies   Patient has no known allergies.   Review of Systems Review of Systems  Constitutional: Negative for chills and fever.  HENT: Negative for congestion and rhinorrhea.   Eyes: Negative for redness and visual disturbance.  Respiratory: Negative for shortness of breath and wheezing.   Cardiovascular: Negative for chest pain and palpitations.  Gastrointestinal: Positive for abdominal pain, diarrhea, nausea and vomiting.  Genitourinary: Negative for dysuria and urgency.  Musculoskeletal: Negative for arthralgias and myalgias.  Skin: Negative for pallor and wound.  Neurological: Negative for dizziness and headaches.     Physical Exam Updated Vital Signs BP (!) 171/86   Pulse 69   Temp 98.2 F (36.8 C) (Oral)   Resp 16   Ht '5\' 2"'  (1.575 m)   Wt 52.6 kg   SpO2 95%   BMI 21.22 kg/m   Physical Exam Vitals signs and nursing note reviewed.  Constitutional:      General: She is not in acute distress.    Appearance: She is well-developed. She is not diaphoretic.  HENT:     Head: Normocephalic and atraumatic.  Eyes:     Pupils: Pupils are equal, round, and reactive to light.  Neck:     Musculoskeletal: Normal range of motion and neck supple.  Cardiovascular:     Rate and Rhythm: Normal rate and regular rhythm.     Heart sounds: No murmur. No friction rub. No gallop.   Pulmonary:     Effort: Pulmonary effort is normal.     Breath sounds: No wheezing or rales.  Abdominal:     General: There is no distension.     Palpations: Abdomen is soft.     Tenderness: There is abdominal tenderness (mild epigastric).  Musculoskeletal:        General: No tenderness.  Skin:    General: Skin is warm and dry.  Neurological:     Mental Status: She is alert and oriented to person, place, and time.  Psychiatric:        Behavior: Behavior normal.      ED Treatments / Results   Labs (all labs ordered are listed, but only abnormal results are displayed) Labs Reviewed  CBC WITH DIFFERENTIAL/PLATELET - Abnormal; Notable for the following components:      Result Value   WBC 11.3 (*)    Neutro Abs 9.8 (*)    All other components within normal limits  COMPREHENSIVE METABOLIC PANEL - Abnormal; Notable for the following components:   Glucose, Bld 111 (*)    All other components within normal limits  LIPASE, BLOOD    EKG None  Radiology No results found.  Procedures Procedures (including critical care time)  Medications Ordered in ED Medications  sodium chloride 0.9 % bolus 1,000 mL (0 mLs Intravenous Stopped 03/07/19 1144)  ondansetron (ZOFRAN) injection 4 mg (4 mg Intravenous Given 03/07/19 1028)  morphine 2 MG/ML injection 2 mg (2 mg Intravenous Given 03/07/19 1028)  alum & mag hydroxide-simeth (MAALOX/MYLANTA) 200-200-20 MG/5ML suspension 30 mL (30 mLs Oral Given 03/07/19 1028)     Initial Impression / Assessment and Plan / ED Course  I have reviewed the triage vital signs and the nursing notes.  Pertinent labs & imaging results that were available during my care of the patient were reviewed by me and considered in my medical decision making (see chart for details).        63 yo F with a chief complaints of nausea vomiting and diarrhea.  This is been going on for about 4 hours.  Patient is well-appearing and nontoxic and not actively vomiting.  We will give a bolus of IV fluids nausea medicine check lab work and reassess.  Patient feeling much better on reassessment.  Lab work with a very mild leukocytosis.  LFTs and lipase are normal.  Tolerating p.o.  Discharge  home.  12:29 PM:  I have discussed the diagnosis/risks/treatment options with the patient and believe the pt to be eligible for discharge home to follow-up with PCP. We also discussed returning to the ED immediately if new or worsening sx occur. We discussed the sx which are most concerning  (e.g., sudden worsening pain, fever, inability to tolerate by mouth) that necessitate immediate return. Medications administered to the patient during their visit and any new prescriptions provided to the patient are listed below.  Medications given during this visit Medications  sodium chloride 0.9 % bolus 1,000 mL (0 mLs Intravenous Stopped 03/07/19 1144)  ondansetron (ZOFRAN) injection 4 mg (4 mg Intravenous Given 03/07/19 1028)  morphine 2 MG/ML injection 2 mg (2 mg Intravenous Given 03/07/19 1028)  alum & mag hydroxide-simeth (MAALOX/MYLANTA) 200-200-20 MG/5ML suspension 30 mL (30 mLs Oral Given 03/07/19 1028)     The patient appears reasonably screen and/or stabilized for discharge and I doubt any other medical condition or other [facility] requiring further screening, evaluation, or treatment in the ED at this time prior to discharge.    Final Clinical Impressions(s) / ED Diagnoses   Final diagnoses:  Nausea vomiting and diarrhea    ED Discharge Orders         Ordered    ondansetron (ZOFRAN ODT) 4 MG disintegrating tablet     03/07/19 Clayton, Sesar Madewell, DO 03/07/19 1229

## 2019-03-07 NOTE — ED Triage Notes (Signed)
She reports n/v/d this morning. She states she had a similar episode "about a year ago and I was admitted to  [facility] for five days". She is in no distress.

## 2019-03-07 NOTE — Discharge Instructions (Signed)
Try zantac or pepcid twice a day.  Try to avoid things that may make this worse, most commonly these are spicy foods tomato based products fatty foods chocolate and peppermint.  Alcohol and tobacco can also make this worse.  Return to the emergency department for sudden worsening pain fever or inability to eat or drink.  Take imodium for diarrhea.  Follow the instructions on the bottle

## 2019-04-02 DIAGNOSIS — H6123 Impacted cerumen, bilateral: Secondary | ICD-10-CM | POA: Insufficient documentation

## 2019-04-02 DIAGNOSIS — H900 Conductive hearing loss, bilateral: Secondary | ICD-10-CM | POA: Insufficient documentation

## 2019-05-14 ENCOUNTER — Other Ambulatory Visit: Payer: Self-pay

## 2019-05-14 ENCOUNTER — Emergency Department (HOSPITAL_COMMUNITY): Payer: Self-pay

## 2019-05-14 ENCOUNTER — Encounter (HOSPITAL_COMMUNITY): Payer: Self-pay

## 2019-05-14 ENCOUNTER — Emergency Department (HOSPITAL_COMMUNITY)
Admission: EM | Admit: 2019-05-14 | Discharge: 2019-05-14 | Disposition: A | Discharge status: Home / Self Care | Payer: Self-pay | Attending: Emergency Medicine | Admitting: Emergency Medicine

## 2019-05-14 ENCOUNTER — Ambulatory Visit (HOSPITAL_COMMUNITY)
Admission: EM | Admit: 2019-05-14 | Discharge: 2019-05-14 | Disposition: A | Discharge status: Home / Self Care | Payer: Self-pay

## 2019-05-14 DIAGNOSIS — Z79899 Other long term (current) drug therapy: Secondary | ICD-10-CM | POA: Insufficient documentation

## 2019-05-14 DIAGNOSIS — R42 Dizziness and giddiness: Secondary | ICD-10-CM

## 2019-05-14 DIAGNOSIS — I10 Essential (primary) hypertension: Secondary | ICD-10-CM

## 2019-05-14 DIAGNOSIS — Z7984 Long term (current) use of oral hypoglycemic drugs: Secondary | ICD-10-CM | POA: Insufficient documentation

## 2019-05-14 DIAGNOSIS — Z7982 Long term (current) use of aspirin: Secondary | ICD-10-CM | POA: Insufficient documentation

## 2019-05-14 DIAGNOSIS — E119 Type 2 diabetes mellitus without complications: Secondary | ICD-10-CM | POA: Insufficient documentation

## 2019-05-14 DIAGNOSIS — Z760 Encounter for issue of repeat prescription: Secondary | ICD-10-CM

## 2019-05-14 DIAGNOSIS — R110 Nausea: Secondary | ICD-10-CM

## 2019-05-14 LAB — BASIC METABOLIC PANEL
Anion gap: 9 (ref 5–15)
BUN: 10 mg/dL (ref 8–23)
CO2: 24 mmol/L (ref 22–32)
Calcium: 9.5 mg/dL (ref 8.9–10.3)
Chloride: 107 mmol/L (ref 98–111)
Creatinine, Ser: 0.57 mg/dL (ref 0.44–1.00)
GFR calc Af Amer: >60 mL/min (ref >60)
GFR calc non Af Amer: >60 mL/min (ref >60)
Glucose, Bld: 102 mg/dL — ABNORMAL HIGH (ref 70–99)
Potassium: 3.7 mmol/L (ref 3.5–5.1)
Sodium: 140 mmol/L (ref 135–145)

## 2019-05-14 LAB — TROPONIN I (HIGH SENSITIVITY)
Troponin I (High Sensitivity): 4 ng/L (ref <18)
Troponin I (High Sensitivity): 5 ng/L (ref <18)

## 2019-05-14 LAB — CBC
HCT: 39.8 % (ref 36.0–46.0)
Hemoglobin: 12.8 g/dL (ref 12.0–15.0)
MCH: 27.8 pg (ref 26.0–34.0)
MCHC: 32.2 g/dL (ref 30.0–36.0)
MCV: 86.5 fL (ref 80.0–100.0)
Platelets: 307 10*3/uL (ref 150–400)
RBC: 4.6 MIL/uL (ref 3.87–5.11)
RDW: 13.5 % (ref 11.5–15.5)
WBC: 10.2 10*3/uL (ref 4.0–10.5)
nRBC: 0 % (ref 0.0–0.2)

## 2019-05-14 MED ORDER — AMLODIPINE BESYLATE 10 MG PO TABS: 10.0000 mg | ORAL_TABLET | Freq: Every day | ORAL | 3 refills | Status: DC

## 2019-05-14 MED ORDER — ACETAMINOPHEN 500 MG PO TABS
1000.0000 mg | ORAL_TABLET | Freq: Once | ORAL | Status: AC
  Administered 2019-05-14: 1000 mg via ORAL
  Filled 2019-05-14: qty 2

## 2019-05-14 MED ORDER — SODIUM CHLORIDE 0.9% FLUSH: 3.0000 mL | Freq: Once | INTRAVENOUS | Status: DC

## 2019-05-14 MED ORDER — METOPROLOL SUCCINATE ER 25 MG PO TB24: 50.0000 mg | ORAL_TABLET | Freq: Every day | ORAL | 3 refills | Status: DC

## 2019-05-14 MED ORDER — AMLODIPINE BESYLATE 5 MG PO TABS
10.0000 mg | ORAL_TABLET | Freq: Once | ORAL | Status: AC
  Administered 2019-05-14: 10 mg via ORAL
  Filled 2019-05-14: qty 2

## 2019-05-14 NOTE — Discharge Instructions (Signed)
Start taking the amlodipine for the next 2 weeks.  Check your blood pressure at least 2-3 times a week and keep a record of it.  A prescription for metoprolol was sent to your pharmacy but do not start taking that until you have been on the other medication for at least 2 weeks and if your blood pressure remains elevated.  Hopefully will be able to see your doctor before this time

## 2019-05-14 NOTE — Discharge Instructions (Addendum)
Please go to the ER for further evaluation °

## 2019-05-14 NOTE — ED Provider Notes (Signed)
[place]    CSN: [id number] Arrival date & time: 05/14/19  1018      History   Chief Complaint Chief Complaint  Patient presents with  . Hypertension    HPI [name] is a 63 y.o. female.   Patient is a 63 year old female with past medical history of anemia, arthritis, diabetes, hyperlipidemia, hypertension, TIA.  She presents today with dizziness, lightheadedness, nausea, generalized weakness, headache.  Symptoms have been constant, waxing waning over the past couple days.  Checked her blood pressure and was extremely high.  Did not feel well at work this morning.  Blood pressure is 206/120 today.  She reports she has not had her blood pressure medication in many many months.  Denies any blurred vision, numbness, tingling, unilateral weakness, slurred speech or facial droop.    ROS per HPI      Past Medical History:  Diagnosis Date  . Anemia    hx  . Arthritis    "all over" (06/19/2017)  . Diabetes mellitus without complication ([place])   . Hyperlipidemia   . Hypertension   . TIA (transient ischemic attack)    pt unaware of this hx on 06/19/2017    Patient Active Problem List   Diagnosis Date Noted  . Nausea and vomiting 05/17/2018  . Enteritis 06/19/2017  . Abdominal pain 06/18/2017  . Hyperlipidemia 06/18/2017  . Elevated glucose 06/18/2017  . Need for hepatitis C screening test 05/23/2017  . Breast pain, left 01/05/2016  . Left ankle sprain 01/20/2015  . Gastroenteritis 11/08/2014  . Malaise 10/14/2014  . Hypokalemia 10/14/2014  . Essential hypertension   . Bilateral wrist pain 10/06/2014  . Hot flashes, menopausal 10/06/2014  . Cerebral aneurysm without rupture 08/20/2014  . TIA (transient ischemic attack) 08/19/2014  . Accelerated hypertension 02/24/2013  . Depression 02/24/2013    Past Surgical History:  Procedure Laterality Date  . ANTERIOR CERVICAL DECOMP/DISCECTOMY FUSION    . BACK SURGERY    . CESAREAN SECTION  1986  .  IR GENERIC HISTORICAL  09/21/2014   IR ANGIO INTRA EXTRACRAN SEL INTERNAL CAROTID UNI L MOD SED 09/21/2014 [name], MD [facility]  . IR GENERIC HISTORICAL  09/21/2014   IR ANGIO INTRA EXTRACRAN SEL COM CAROTID INNOMINATE UNI R MOD SED 09/21/2014 [name], MD [facility]  . IR GENERIC HISTORICAL  09/21/2014   IR ANGIO VERTEBRAL SEL VERTEBRAL UNI L MOD SED 09/21/2014 [name], MD [facility]  . IR GENERIC HISTORICAL  09/21/2014   IR 3D INDEPENDENT WKST 09/21/2014 [name], MD [facility]  . LAPAROSCOPIC CHOLECYSTECTOMY    . TUBAL LIGATION      OB History    Gravida  2   Para  1   Term  1   Preterm      AB  1   Living  1     SAB  1   TAB      Ectopic      Multiple      Live Births  1            Home Medications    Prior to Admission medications   Medication Sig Start Date End Date Taking? Authorizing Provider  ACCU-CHEK GUIDE test strip USE UP TO TWICE DAILY AS DIRECTED 08/30/17   Clent Demark, PA-C  amLODipine (NORVASC) 10 MG tablet Take 1 tablet (10 mg total) by mouth daily. 07/01/17   Clent Demark, PA-C  aspirin EC 81 MG tablet Take  1 tablet (81 mg total) by mouth daily. 02/01/16   Funches, Adriana Mccallum, MD  atorvastatin (LIPITOR) 40 MG tablet Take 1 tablet (40 mg total) daily by mouth. 04/29/17   Clent Demark, PA-C  blood glucose meter kit and supplies KIT Dispense based on patient and insurance preference. Use up to twice daily as directed. (FOR ICD-9 250.00, 250.01). 07/01/17   Clent Demark, PA-C  cyclobenzaprine (FLEXERIL) 5 MG tablet Take 1-2 tablets (5-10 mg total) by mouth 2 (two) times daily as needed for muscle spasms. 11/07/18   Wieters, Hallie C, PA-C  feeding supplement, ENSURE ENLIVE, (ENSURE ENLIVE) LIQD Take 237 mLs by mouth 3 (three) times daily between meals. 07/01/17   Clent Demark, PA-C  ibuprofen (ADVIL) 600 MG tablet Take 1 tablet (600 mg total) by mouth every 6 (six) hours as needed. Patient  taking differently: Take 600 mg by mouth every 6 (six) hours as needed for moderate pain.  11/07/18   Wieters, Hallie C, PA-C  lisinopril-hydrochlorothiazide (ZESTORETIC) 10-12.5 MG tablet Take 2 tablets by mouth daily. 07/01/17   Clent Demark, PA-C  loperamide (IMODIUM A-D) 2 MG tablet Take 1 tablet (2 mg total) by mouth 4 (four) times daily as needed for diarrhea or loose stools. 05/18/18   Thurnell Lose, MD  metFORMIN (GLUCOPHAGE XR) 500 MG 24 hr tablet Take 1 tablet (500 mg total) by mouth daily with breakfast. 07/01/17   Clent Demark, PA-C  metoprolol succinate (TOPROL-XL) 25 MG 24 hr tablet Take 2 tablets (50 mg total) by mouth daily. 07/01/17   Clent Demark, PA-C  ondansetron (ZOFRAN ODT) 4 MG disintegrating tablet 27m ODT q4 hours prn nausea/vomit 03/07/19   FDeno Etienne DO  PARoxetine (PAXIL) 10 MG tablet Take 1 tablet (10 mg total) by mouth daily. 07/01/17   GClent Demark PA-C  potassium chloride SA (K-DUR,KLOR-CON) 20 MEQ tablet Take 2 tablets (40 mEq total) by mouth 2 (two) times daily. 05/22/18   RPattricia Boss MD  promethazine (PHENERGAN) 25 MG tablet Take 1 tablet (25 mg total) by mouth every 6 (six) hours as needed for nausea or vomiting. Patient not taking: Reported on 03/07/2019 05/18/18   SThurnell Lose MD    Family History Family History  Problem Relation Age of Onset  . Hypertension Mother   . Arthritis Mother   . Diabetes Mother   . Heart disease Father   . Diabetes Sister   . Diabetes Brother   . Diabetes Sister   . Cancer Neg Hx     Social History Social History   Tobacco Use  . Smoking status: Never Smoker  . Smokeless tobacco: Never Used  Substance Use Topics  . Alcohol use: Yes    Comment: socially   . Drug use: No     Allergies   Patient has no known allergies.   Review of Systems Review of Systems   Physical Exam Triage Vital Signs ED Triage Vitals  Enc Vitals Group     BP 05/14/19 1109 (!) 206/120     Pulse Rate  05/14/19 1109 76     Resp 05/14/19 1109 17     Temp 05/14/19 1109 98.4 F (36.9 C)     Temp Source 05/14/19 1109 Oral     SpO2 05/14/19 1109 100 %     Weight 05/14/19 1111 160 lb (72.6 kg)     Height --      Head Circumference --      Peak Flow --  Pain Score 05/14/19 1110 6     Pain Loc --      Pain Edu? --      Excl. in [place]? --    No data found.  Updated Vital Signs BP (!) 206/120 (BP Location: Right Arm)   Pulse 76   Temp 98.4 F (36.9 C) (Oral)   Resp 17   Wt 160 lb (72.6 kg)   SpO2 100%   BMI 29.26 kg/m   Visual Acuity Right Eye Distance:   Left Eye Distance:   Bilateral Distance:    Right Eye Near:   Left Eye Near:    Bilateral Near:     Physical Exam Vitals signs and nursing note reviewed.  Constitutional:      Comments: Appears to not feel well.    HENT:     Head: Normocephalic and atraumatic.     Nose: Nose normal.     Mouth/Throat:     Pharynx: Oropharynx is clear.  Eyes:     Extraocular Movements: Extraocular movements intact.     Conjunctiva/sclera: Conjunctivae normal.     Pupils: Pupils are equal, round, and reactive to light.  Neck:     Musculoskeletal: Normal range of motion.  Cardiovascular:     Rate and Rhythm: Normal rate and regular rhythm.     Pulses: Normal pulses.     Heart sounds: Normal heart sounds.  Pulmonary:     Effort: Pulmonary effort is normal.     Breath sounds: Normal breath sounds.  Musculoskeletal: Normal range of motion.  Skin:    General: Skin is warm and dry.  Neurological:     General: No focal deficit present.     Mental Status: She is alert.     Comments: Grip strength equal No facial droop Speech clear Cranial nerves grossly intact Positive Romberg   Psychiatric:        Mood and Affect: Mood normal.      UC Treatments / Results  Labs (all labs ordered are listed, but only abnormal results are displayed) Labs Reviewed - No data to display  EKG   Radiology No results found.  Procedures  Procedures (including critical care time)  Medications Ordered in UC Medications - No data to display  Initial Impression / Assessment and Plan / UC Course  I have reviewed the triage vital signs and the nursing notes.  Pertinent labs & imaging results that were available during my care of the patient were reviewed by me and considered in my medical decision making (see chart for details).     Symptomatic high blood pressure-sending patient to the ER for further evaluation management.  Did have positive Romberg and just does not appear to be well. Very hypertensive at 206/120 today. Patient understanding and agree.hyperste Final Clinical Impressions(s) / UC Diagnoses   Final diagnoses:  Essential hypertension  Dizziness  Nausea without vomiting     Discharge Instructions     Please go to the ER for further evaluation.    ED Prescriptions    None     PDMP not reviewed this encounter.   Orvan July, NP 05/14/19 1204

## 2019-05-14 NOTE — ED Provider Notes (Signed)
[place] EMERGENCY DEPARTMENT Provider Note   CSN: [id number] Arrival date & time: 05/14/19  1206     History   Chief Complaint Chief Complaint  Patient presents with   Hypertension    HPI [name] is a 63 y.o. female.     Patient is a 63 year old female with a history of diabetes, hypertension, hyperlipidemia, anemia, possible prior TIA who presents today with intermittent headaches, nausea, malaise that is been fluctuating over the last few months.  Patient was on 4 different blood pressure medications and has not been on any of her medication for greater than 6 months because she states things at home became stressful and she had a lot going on and she just never followed up with her doctor.  She denies any chest pain, shortness of breath, new visual changes, unilateral weakness or numbness.  She works at a job caring for someone overnight and this morning she had a bad headache and her colleague checked her blood pressure and it was elevated at 190/118.  They recommended she come for further evaluation.  She does smoke cigarettes and marijuana intermittently but not daily.  She does not use any cocaine or alcohol.  She is currently not taking any medications.  The history is provided by the patient.  Hypertension This is a chronic problem. The problem has been gradually worsening. Associated symptoms include headaches. Associated symptoms comments: General malaise, intermittent nausea.  No visual changes, unilateral weakness/numbness or speech changes.  No difficulty walking.  No chest pain or SOB.  Denies recent URI or infection sx.  [facility]mo[facility]  Just states she had so much going on she just let things go.. Nothing aggravates the symptoms. Nothing relieves the symptoms. She has tried rest for the symptoms. The treatment provided no relief.    Past Medical History:  Diagnosis Date   Anemia    hx    Arthritis    "all over" (06/19/2017)   Diabetes mellitus without complication ([place]    Hyperlipidemia    Hypertension    TIA (transient ischemic attack)    pt unaware of this hx on 06/19/2017    Patient Active Problem List   Diagnosis Date Noted   Nausea and vomiting 05/17/2018   Enteritis 06/19/2017   Abdominal pain 06/18/2017   Hyperlipidemia 06/18/2017   Elevated glucose 06/18/2017   Need for hepatitis C screening test 05/23/2017   Breast pain, left 01/05/2016   Left ankle sprain 01/20/2015   Gastroenteritis 11/08/2014   Malaise 10/14/2014   Hypokalemia 10/14/2014   Essential hypertension    Bilateral wrist pain 10/06/2014   Hot flashes, menopausal 10/06/2014   Cerebral aneurysm without rupture 08/20/2014   TIA (transient ischemic attack) 08/19/2014   Accelerated hypertension 02/24/2013   Depression 02/24/2013    Past Surgical History:  Procedure Laterality Date   ANTERIOR CERVICAL DECOMP/DISCECTOMY FUSION     BACK SURGERY     CESAREAN SECTION  1986   IR GENERIC HISTORICAL  09/21/2014   IR ANGIO INTRA EXTRACRAN SEL INTERNAL CAROTID UNI L MOD SED 09/21/2014 [facility] Lose [facility]   IR GENERIC HISTORICAL  09/21/2014   IR ANGIO INTRA EXTRACRAN SEL COM CAROTID INNOMINATE UNI R MOD SED 09/21/2014 [facility]Consuella Lose MD [facility]   IR GENERIC HISTORICAL  09/21/2014   IR ANGIO VERTEBRAL SEL VERTEBRAL UNI L MOD SED 09/21/2014 [facility]Consuella Lose MD [facility]   IR GENERIC HISTORICAL  09/21/2014  IR 3D INDEPENDENT WKST 09/21/2014 [name], MD [facility]   LAPAROSCOPIC CHOLECYSTECTOMY     TUBAL LIGATION       OB History    Gravida  2   Para  1   Term  1   Preterm      AB  1   Living  1     SAB  1   TAB      Ectopic      Multiple      Live Births  1            Home Medications    Prior to Admission medications   Medication Sig Start Date End Date Taking? Authorizing Provider  ACCU-CHEK  GUIDE test strip USE UP TO TWICE DAILY AS DIRECTED 08/30/17   Clent Demark, PA-C  amLODipine (NORVASC) 10 MG tablet Take 1 tablet (10 mg total) by mouth daily. 07/01/17   Clent Demark, PA-C  aspirin EC 81 MG tablet Take 1 tablet (81 mg total) by mouth daily. 02/01/16   Funches, Adriana Mccallum, MD  atorvastatin (LIPITOR) 40 MG tablet Take 1 tablet (40 mg total) daily by mouth. 04/29/17   Clent Demark, PA-C  blood glucose meter kit and supplies KIT Dispense based on patient and insurance preference. Use up to twice daily as directed. (FOR ICD-9 250.00, 250.01). 07/01/17   Clent Demark, PA-C  cyclobenzaprine (FLEXERIL) 5 MG tablet Take 1-2 tablets (5-10 mg total) by mouth 2 (two) times daily as needed for muscle spasms. 11/07/18   Wieters, Hallie C, PA-C  feeding supplement, ENSURE ENLIVE, (ENSURE ENLIVE) LIQD Take 237 mLs by mouth 3 (three) times daily between meals. 07/01/17   Clent Demark, PA-C  ibuprofen (ADVIL) 600 MG tablet Take 1 tablet (600 mg total) by mouth every 6 (six) hours as needed. Patient taking differently: Take 600 mg by mouth every 6 (six) hours as needed for moderate pain.  11/07/18   Wieters, Hallie C, PA-C  lisinopril-hydrochlorothiazide (ZESTORETIC) 10-12.5 MG tablet Take 2 tablets by mouth daily. 07/01/17   Clent Demark, PA-C  loperamide (IMODIUM A-D) 2 MG tablet Take 1 tablet (2 mg total) by mouth 4 (four) times daily as needed for diarrhea or loose stools. 05/18/18   Thurnell Lose, MD  metFORMIN (GLUCOPHAGE XR) 500 MG 24 hr tablet Take 1 tablet (500 mg total) by mouth daily with breakfast. 07/01/17   Clent Demark, PA-C  metoprolol succinate (TOPROL-XL) 25 MG 24 hr tablet Take 2 tablets (50 mg total) by mouth daily. 07/01/17   Clent Demark, PA-C  ondansetron (ZOFRAN ODT) 4 MG disintegrating tablet 55m ODT q4 hours prn nausea/vomit 03/07/19   FDeno Etienne DO  PARoxetine (PAXIL) 10 MG tablet Take 1 tablet (10 mg total) by mouth daily. 07/01/17   GClent Demark PA-C  potassium chloride SA (K-DUR,KLOR-CON) 20 MEQ tablet Take 2 tablets (40 mEq total) by mouth 2 (two) times daily. 05/22/18   RPattricia Boss MD  promethazine (PHENERGAN) 25 MG tablet Take 1 tablet (25 mg total) by mouth every 6 (six) hours as needed for nausea or vomiting. Patient not taking: Reported on 03/07/2019 05/18/18   SThurnell Lose MD    Family History Family History  Problem Relation Age of Onset   Hypertension Mother    Arthritis Mother    Diabetes Mother    Heart disease Father    Diabetes Sister    Diabetes Brother    Diabetes Sister  Cancer Neg Hx     Social History Social History   Tobacco Use   Smoking status: Never Smoker   Smokeless tobacco: Never Used  Substance Use Topics   Alcohol use: Yes    Comment: socially    Drug use: No     Allergies   Patient has no known allergies.   Review of Systems Review of Systems  Neurological: Positive for headaches.  All other systems reviewed and are negative.    Physical Exam Updated Vital Signs BP (!) 207/114    Pulse 74    Temp 98.1 F (36.7 C) (Oral)    Resp 16    SpO2 100%   Physical Exam Vitals signs and nursing note reviewed.  Constitutional:      General: She is not in acute distress.    Appearance: She is well-developed.  HENT:     Head: Normocephalic and atraumatic.     Mouth/Throat:     Mouth: Mucous membranes are moist.  Eyes:     Extraocular Movements: Extraocular movements intact.     Conjunctiva/sclera: Conjunctivae normal.     Pupils: Pupils are equal, round, and reactive to light.  Neck:     Musculoskeletal: Normal range of motion and neck supple.  Cardiovascular:     Rate and Rhythm: Normal rate and regular rhythm.     Pulses: Normal pulses.     Heart sounds: No murmur.  Pulmonary:     Effort: Pulmonary effort is normal. No respiratory distress.     Breath sounds: Normal breath sounds. No wheezing or rales.  Abdominal:     General: There is no  distension.     Palpations: Abdomen is soft.     Tenderness: There is no abdominal tenderness. There is no guarding or rebound.  Musculoskeletal: Normal range of motion.        General: No tenderness.  Skin:    General: Skin is warm and dry.     Findings: No erythema or rash.  Neurological:     Mental Status: She is alert and oriented to person, place, and time. Mental status is at baseline.     Cranial Nerves: No cranial nerve deficit.     Sensory: No sensory deficit.     Motor: No weakness.     Gait: Gait normal.     Comments: No visual field cuts  Psychiatric:        Mood and Affect: Mood normal.        Behavior: Behavior normal.        Thought Content: Thought content normal.      ED Treatments / Results  Labs (all labs ordered are listed, but only abnormal results are displayed) Labs Reviewed  BASIC METABOLIC PANEL - Abnormal; Notable for the following components:      Result Value   Glucose, Bld 102 (*)    All other components within normal limits  CBC  TROPONIN I (HIGH SENSITIVITY)  TROPONIN I (HIGH SENSITIVITY)    EKG None  Radiology Dg Chest 2 View  Result Date: 05/14/2019 CLINICAL DATA:  Hypertension. EXAM: CHEST - 2 VIEW COMPARISON:  05/22/2018 FINDINGS: The heart size and mediastinal contours are within normal limits. Stable ectasia of thoracic aorta. Both lungs are clear. Cervical spine fusion hardware again noted. IMPRESSION: Stable exam. No active cardiopulmonary disease. Electronically Signed   By: Marlaine Hind M.D.   On: 05/14/2019 13:14    Procedures Procedures (including critical care time)  Medications Ordered in ED  Medications  sodium chloride flush (NS) 0.9 % injection 3 mL (0 mLs Intravenous Hold 05/14/19 1638)  acetaminophen (TYLENOL) tablet 1,000 mg (has no administration in time range)  amLODipine (NORVASC) tablet 10 mg (has no administration in time range)     Initial Impression / Assessment and Plan / ED Course  I have reviewed the  triage vital signs and the nursing notes.  Pertinent labs & imaging results that were available during my care of the patient were reviewed by me and considered in my medical decision making (see chart for details).        63 year old female presenting today with hypertension.  Patient has now been off medication for greater than 6 months.  She is complaining of intermittent headache, occasional nausea and malaise.  She has no chest pain or shortness of breath.  Patient is well-appearing on exam with a normal neuro exam.  Blood pressure has been persistently elevated at 200's/114.    Patient denies any chest pain or shortness of breath concerning for any acute cardiac issue, low suspicion for Covid or infectious symptoms.  Low suspicion for head bleed or stroke as patient's neuro exam today is normal.  Renal function is within normal limits, hemoglobin is normal and despite her not being on diabetic medication blood sugar is only 102.  Initial troponin is only for and chest x-ray is stable without acute findings.  Since patient has been off of her meds for more than 6 months we will start back slowly.  We will start her back on 10 mg of amlodipine which she is to take for 2 weeks and follow-up with her doctor at the [facility].  She was given a prescription as well for the metoprolol to start after 2 weeks if her blood pressure remains elevated and she has not been able to get into see her doctor.  Patient voices understanding and was given strict return precautions.  Final Clinical Impressions(s) / ED Diagnoses   Final diagnoses:  Hypertension, unspecified type    ED Discharge Orders         Ordered    amLODipine (NORVASC) 10 MG tablet  Daily     05/14/19 1733    metoprolol succinate (TOPROL-XL) 25 MG 24 hr tablet  Daily     05/14/19 1733           Blanchie Dessert, MD 05/14/19 1734

## 2019-05-14 NOTE — ED Triage Notes (Signed)
Pt sent here by [facility] for hypertension with hx of same. Pt sts she has been off her htn medication for months d/t so much going on. Pt c/ intermittent headache, lightheadedness, and nausea.

## 2019-05-14 NOTE — ED Notes (Signed)
Pt ambulated self efficiently to restroom with no difficulty. Pt returned safely to bedside. 

## 2019-05-14 NOTE — ED Triage Notes (Signed)
Pt states her b/p is up today. Pt states her b/p at was 199/118/. Pt b/p today is 209/120 today. Pt states she feel light headed.pt  States she has a headache and she very nauseous.

## 2019-08-26 ENCOUNTER — Emergency Department (HOSPITAL_COMMUNITY)
Admission: EM | Admit: 2019-08-26 | Discharge: 2019-08-26 | Disposition: A | Discharge status: Home / Self Care | Payer: Medicare Other | Attending: Emergency Medicine | Admitting: Emergency Medicine

## 2019-08-26 ENCOUNTER — Other Ambulatory Visit: Payer: Self-pay

## 2019-08-26 DIAGNOSIS — I10 Essential (primary) hypertension: Secondary | ICD-10-CM | POA: Diagnosis not present

## 2019-08-26 DIAGNOSIS — Z7984 Long term (current) use of oral hypoglycemic drugs: Secondary | ICD-10-CM | POA: Insufficient documentation

## 2019-08-26 DIAGNOSIS — R112 Nausea with vomiting, unspecified: Secondary | ICD-10-CM | POA: Diagnosis present

## 2019-08-26 DIAGNOSIS — Z79899 Other long term (current) drug therapy: Secondary | ICD-10-CM | POA: Insufficient documentation

## 2019-08-26 DIAGNOSIS — Z7982 Long term (current) use of aspirin: Secondary | ICD-10-CM | POA: Insufficient documentation

## 2019-08-26 DIAGNOSIS — R197 Diarrhea, unspecified: Secondary | ICD-10-CM | POA: Diagnosis not present

## 2019-08-26 DIAGNOSIS — R109 Unspecified abdominal pain: Secondary | ICD-10-CM

## 2019-08-26 DIAGNOSIS — R1110 Vomiting, unspecified: Secondary | ICD-10-CM

## 2019-08-26 DIAGNOSIS — E119 Type 2 diabetes mellitus without complications: Secondary | ICD-10-CM | POA: Insufficient documentation

## 2019-08-26 DIAGNOSIS — R10817 Generalized abdominal tenderness: Secondary | ICD-10-CM | POA: Diagnosis not present

## 2019-08-26 DIAGNOSIS — Z760 Encounter for issue of repeat prescription: Secondary | ICD-10-CM

## 2019-08-26 LAB — URINALYSIS, ROUTINE W REFLEX MICROSCOPIC
Bilirubin Urine: NEGATIVE
Glucose, UA: 150 mg/dL — AB
Ketones, ur: NEGATIVE mg/dL
Leukocytes,Ua: NEGATIVE
Nitrite: NEGATIVE
Protein, ur: 100 mg/dL — AB
Specific Gravity, Urine: 1.014 (ref 1.005–1.030)
pH: 7 (ref 5.0–8.0)

## 2019-08-26 LAB — COMPREHENSIVE METABOLIC PANEL
ALT: 13 U/L (ref 0–44)
AST: 19 U/L (ref 15–41)
Albumin: 4 g/dL (ref 3.5–5.0)
Alkaline Phosphatase: 80 U/L (ref 38–126)
Anion gap: 12 (ref 5–15)
BUN: 13 mg/dL (ref 8–23)
CO2: 23 mmol/L (ref 22–32)
Calcium: 9.6 mg/dL (ref 8.9–10.3)
Chloride: 107 mmol/L (ref 98–111)
Creatinine, Ser: 0.6 mg/dL (ref 0.44–1.00)
GFR calc Af Amer: >60 mL/min (ref >60)
GFR calc non Af Amer: >60 mL/min (ref >60)
Glucose, Bld: 111 mg/dL — ABNORMAL HIGH (ref 70–99)
Potassium: 3.6 mmol/L (ref 3.5–5.1)
Sodium: 142 mmol/L (ref 135–145)
Total Bilirubin: 0.5 mg/dL (ref 0.3–1.2)
Total Protein: 7.2 g/dL (ref 6.5–8.1)

## 2019-08-26 LAB — CBC WITH DIFFERENTIAL/PLATELET
Abs Immature Granulocytes: 0.09 10*3/uL — ABNORMAL HIGH (ref 0.00–0.07)
Basophils Absolute: 0.1 10*3/uL (ref 0.0–0.1)
Basophils Relative: 1 %
Eosinophils Absolute: 0 10*3/uL (ref 0.0–0.5)
Eosinophils Relative: 0 %
HCT: 42.8 % (ref 36.0–46.0)
Hemoglobin: 13.3 g/dL (ref 12.0–15.0)
Immature Granulocytes: 1 %
Lymphocytes Relative: 9 %
Lymphs Abs: 1.2 10*3/uL (ref 0.7–4.0)
MCH: 27.6 pg (ref 26.0–34.0)
MCHC: 31.1 g/dL (ref 30.0–36.0)
MCV: 88.8 fL (ref 80.0–100.0)
Monocytes Absolute: 0.4 10*3/uL (ref 0.1–1.0)
Monocytes Relative: 3 %
Neutro Abs: 11.8 10*3/uL — ABNORMAL HIGH (ref 1.7–7.7)
Neutrophils Relative %: 86 %
Platelets: 286 10*3/uL (ref 150–400)
RBC: 4.82 MIL/uL (ref 3.87–5.11)
RDW: 13.2 % (ref 11.5–15.5)
WBC: 13.5 10*3/uL — ABNORMAL HIGH (ref 4.0–10.5)
nRBC: 0 % (ref 0.0–0.2)

## 2019-08-26 LAB — LIPASE, BLOOD: Lipase: 28 U/L (ref 11–51)

## 2019-08-26 MED ORDER — METOCLOPRAMIDE HCL 5 MG/ML IJ SOLN
10.0000 mg | Freq: Once | INTRAMUSCULAR | Status: AC
  Administered 2019-08-26: 10 mg via INTRAVENOUS
  Filled 2019-08-26: qty 2

## 2019-08-26 MED ORDER — AMLODIPINE BESYLATE 10 MG PO TABS: 10.0000 mg | ORAL_TABLET | Freq: Every day | ORAL | 0 refills | Status: DC

## 2019-08-26 MED ORDER — DICYCLOMINE HCL 10 MG PO CAPS
10.0000 mg | ORAL_CAPSULE | Freq: Once | ORAL | Status: AC
  Administered 2019-08-26: 11:00:00 10 mg via ORAL
  Filled 2019-08-26: qty 1

## 2019-08-26 MED ORDER — SODIUM CHLORIDE 0.9 % IV BOLUS
1000.0000 mL | Freq: Once | INTRAVENOUS | Status: AC
  Administered 2019-08-26: 10:00:00 1000 mL via INTRAVENOUS

## 2019-08-26 MED ORDER — ONDANSETRON 4 MG PO TBDP: 4.0000 mg | ORAL_TABLET | Freq: Three times a day (TID) | ORAL | 0 refills | Status: DC | PRN

## 2019-08-26 NOTE — ED Provider Notes (Signed)
[facility] EMERGENCY DEPARTMENT Provider Note   CSN: [id number] Arrival date & time: 08/26/19  2233     History Chief Complaint  Patient presents with  . Hypertension  . Diarrhea    [name] is a 64 y.o. female.  The history is provided by the patient, the EMS personnel and medical records. No language interpreter was used.  Hypertension  Diarrhea  [name] is a 64 y.o. female who presents to the Emergency Department complaining of nausea, vomiting, diarrhea. She presents the emergency department by EMS complaining of vomiting and diarrhea that began abruptly at 4 AM this morning. She has experienced numerous episodes of foamy emesis and diarrhea. She denies any hematemesis or hematochezia. She was feeling well yesterday with no complaints. She did eat a pizza last night. No known sick contacts or bad food exposures. She denies any fevers but she does have chills. She has associated abdominal cramping. She has a mild headache this morning. She denies any chest pain, sob, dysuria. 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 No known COVID 19 exposures. No recent antibiotic use. She has a history of hypertension but is not currently on medications. She did experience a similar episode that was far more severe a few years ago that required hospitalization for about a week.    Past Medical History:  Diagnosis Date  . Anemia    hx  . Arthritis    "all over" (06/19/2017)  . Diabetes mellitus without complication ([place])   . Hyperlipidemia   . Hypertension   . TIA (transient ischemic attack)    pt unaware of this hx on 06/19/2017    Patient Active Problem List   Diagnosis Date Noted  . Nausea and vomiting 05/17/2018  . Enteritis 06/19/2017  . Abdominal pain 06/18/2017  . Hyperlipidemia 06/18/2017  . Elevated glucose 06/18/2017  . Need for hepatitis C screening test 05/23/2017  . Breast pain, left 01/05/2016  . Left ankle sprain 01/20/2015    . Gastroenteritis 11/08/2014  . Malaise 10/14/2014  . Hypokalemia 10/14/2014  . Essential hypertension   . Bilateral wrist pain 10/06/2014  . Hot flashes, menopausal 10/06/2014  . Cerebral aneurysm without rupture 08/20/2014  . TIA (transient ischemic attack) 08/19/2014  . Accelerated hypertension 02/24/2013  . Depression 02/24/2013    Past Surgical History:  Procedure Laterality Date  . ANTERIOR CERVICAL DECOMP/DISCECTOMY FUSION    . BACK SURGERY    . CESAREAN SECTION  1986  . IR GENERIC HISTORICAL  09/21/2014   IR ANGIO INTRA EXTRACRAN SEL INTERNAL CAROTID UNI L MOD SED 09/21/2014 [name], MD [facility]  . IR GENERIC HISTORICAL  09/21/2014   IR ANGIO INTRA EXTRACRAN SEL COM CAROTID INNOMINATE UNI R MOD SED 09/21/2014 [name], MD [facility]  . IR GENERIC HISTORICAL  09/21/2014   IR ANGIO VERTEBRAL SEL VERTEBRAL UNI L MOD SED 09/21/2014 [name], MD [facility]  . IR GENERIC HISTORICAL  09/21/2014   IR 3D INDEPENDENT WKST 09/21/2014 [name], MD [facility]  . LAPAROSCOPIC CHOLECYSTECTOMY    . TUBAL LIGATION       OB History    Gravida  2   Para  1   Term  1   Preterm      AB  1   Living  1     SAB  1   TAB      Ectopic      Multiple  Live Births  1           Family History  Problem Relation Age of Onset  . Hypertension Mother   . Arthritis Mother   . Diabetes Mother   . Heart disease Father   . Diabetes Sister   . Diabetes Brother   . Diabetes Sister   . Cancer Neg Hx     Social History   Tobacco Use  . Smoking status: Never Smoker  . Smokeless tobacco: Never Used  Substance Use Topics  . Alcohol use: Yes    Comment: socially   . Drug use: No    Home Medications Prior to Admission medications   Medication Sig Start Date End Date Taking? Authorizing Provider  ACCU-CHEK GUIDE test strip USE UP TO TWICE DAILY AS DIRECTED 08/30/17   Clent Demark, PA-C  amLODipine (NORVASC) 10 MG tablet  Take 1 tablet (10 mg total) by mouth daily. 08/26/19   Quintella Reichert, MD  aspirin EC 81 MG tablet Take 1 tablet (81 mg total) by mouth daily. 02/01/16   Funches, Adriana Mccallum, MD  atorvastatin (LIPITOR) 40 MG tablet Take 1 tablet (40 mg total) daily by mouth. 04/29/17   Clent Demark, PA-C  blood glucose meter kit and supplies KIT Dispense based on patient and insurance preference. Use up to twice daily as directed. (FOR ICD-9 250.00, 250.01). 07/01/17   Clent Demark, PA-C  cyclobenzaprine (FLEXERIL) 5 MG tablet Take 1-2 tablets (5-10 mg total) by mouth 2 (two) times daily as needed for muscle spasms. 11/07/18   Wieters, Hallie C, PA-C  feeding supplement, ENSURE ENLIVE, (ENSURE ENLIVE) LIQD Take 237 mLs by mouth 3 (three) times daily between meals. 07/01/17   Clent Demark, PA-C  ibuprofen (ADVIL) 600 MG tablet Take 1 tablet (600 mg total) by mouth every 6 (six) hours as needed. Patient taking differently: Take 600 mg by mouth every 6 (six) hours as needed for moderate pain.  11/07/18   Wieters, Hallie C, PA-C  lisinopril-hydrochlorothiazide (ZESTORETIC) 10-12.5 MG tablet Take 2 tablets by mouth daily. 07/01/17   Clent Demark, PA-C  loperamide (IMODIUM A-D) 2 MG tablet Take 1 tablet (2 mg total) by mouth 4 (four) times daily as needed for diarrhea or loose stools. 05/18/18   Thurnell Lose, MD  metFORMIN (GLUCOPHAGE XR) 500 MG 24 hr tablet Take 1 tablet (500 mg total) by mouth daily with breakfast. 07/01/17   Clent Demark, PA-C  metoprolol succinate (TOPROL-XL) 25 MG 24 hr tablet Take 2 tablets (50 mg total) by mouth daily. 05/14/19   Blanchie Dessert, MD  ondansetron (ZOFRAN ODT) 4 MG disintegrating tablet Take 1 tablet (4 mg total) by mouth every 8 (eight) hours as needed for nausea or vomiting. 08/26/19   Quintella Reichert, MD  PARoxetine (PAXIL) 10 MG tablet Take 1 tablet (10 mg total) by mouth daily. 07/01/17   Clent Demark, PA-C  potassium chloride SA (K-DUR,KLOR-CON) 20 MEQ tablet  Take 2 tablets (40 mEq total) by mouth 2 (two) times daily. 05/22/18   Pattricia Boss, MD  promethazine (PHENERGAN) 25 MG tablet Take 1 tablet (25 mg total) by mouth every 6 (six) hours as needed for nausea or vomiting. Patient not taking: Reported on 03/07/2019 05/18/18   Thurnell Lose, MD    Allergies    Patient has no known allergies.  Review of Systems   Review of Systems  Gastrointestinal: Positive for diarrhea.  All other systems reviewed and are negative.  Physical Exam Updated Vital Signs BP (!) 151/86   Pulse 88   Temp 97.8 F (36.6 C) (Oral)   Resp 18   Ht '5\' 2"'  (1.575 m)   Wt 52.2 kg   SpO2 100%   BMI 21.03 kg/m   Physical Exam Vitals and nursing note reviewed.  Constitutional:      Appearance: She is well-developed.  HENT:     Head: Normocephalic and atraumatic.  Cardiovascular:     Rate and Rhythm: Normal rate and regular rhythm.     Heart sounds: No murmur.  Pulmonary:     Effort: Pulmonary effort is normal. No respiratory distress.     Breath sounds: Normal breath sounds.  Abdominal:     Palpations: Abdomen is soft.     Tenderness: There is no guarding or rebound.     Comments: Mild generalized abdominal tenderness  Musculoskeletal:        General: No swelling or tenderness.     Cervical back: Neck supple.  Skin:    General: Skin is warm and dry.  Neurological:     Mental Status: She is alert and oriented to person, place, and time.  Psychiatric:        Behavior: Behavior normal.     ED Results / Procedures / Treatments   Labs (all labs ordered are listed, but only abnormal results are displayed) Labs Reviewed  COMPREHENSIVE METABOLIC PANEL - Abnormal; Notable for the following components:      Result Value   Glucose, Bld 111 (*)    All other components within normal limits  CBC WITH DIFFERENTIAL/PLATELET - Abnormal; Notable for the following components:   WBC 13.5 (*)    Neutro Abs 11.8 (*)    Abs Immature Granulocytes 0.09 (*)     All other components within normal limits  URINALYSIS, ROUTINE W REFLEX MICROSCOPIC - Abnormal; Notable for the following components:   Glucose, UA 150 (*)    Hgb urine dipstick SMALL (*)    Protein, ur 100 (*)    Bacteria, UA RARE (*)    All other components within normal limits  LIPASE, BLOOD    EKG None  Radiology No results found.  Procedures Procedures (including critical care time)  Medications Ordered in ED Medications  sodium chloride 0.9 % bolus 1,000 mL (0 mLs Intravenous Stopped 08/26/19 1548)  metoCLOPramide (REGLAN) injection 10 mg (10 mg Intravenous Given 08/26/19 1020)  dicyclomine (BENTYL) capsule 10 mg (10 mg Oral Given 08/26/19 1127)    ED Course  I have reviewed the triage vital signs and the nursing notes.  Pertinent labs & imaging results that were available during my care of the patient were reviewed by me and considered in my medical decision making (see chart for details).    MDM Rules/Calculators/A&P                     Patient here for evaluation of vomiting, diarrhea and abdominal pain. She does have mild tenderness on examination without peritoneal findings. She was treated with antiemetics and mental. On repeat assessment her symptoms of resolved and she is feeling improved, repeat abdominal examination with no significant tenderness. She was hypertensive on ED arrival, her blood pressure did improve without any antihypertensives. Presentation is not consistent with perforated viscous, serious bacterial infection, hypertensive emergency. Patient has been out of her antihypertensives, will restart her amlodipine. Discussed with patient importance of outpatient follow-up as well as return precautions for any recurrent abdominal pain or  new concerning symptoms.  Final Clinical Impression(s) / ED Diagnoses Final diagnoses:  Abdominal pain, vomiting, and diarrhea    Rx / DC Orders ED Discharge Orders         Ordered    ondansetron (ZOFRAN ODT) 4 MG  disintegrating tablet  Every 8 hours PRN     08/26/19 1334    amLODipine (NORVASC) 10 MG tablet  Daily     08/26/19 1334           Quintella Reichert, MD 08/26/19 1558

## 2019-08-26 NOTE — ED Notes (Signed)
Patient verbalizes understanding of discharge instructions. Opportunity for questioning and answers were provided. Armband removed by staff, pt discharged from ED.  

## 2019-08-26 NOTE — ED Notes (Signed)
Pt has held coffee down, pt given water. She is calling her ride.

## 2019-08-26 NOTE — ED Notes (Signed)
Pt drinking coffee

## 2019-08-26 NOTE — ED Triage Notes (Signed)
PT from Home and reports  N/V/D that started at 0400 this AM. Pt denies any sick contacts. Pt was ambulate to BR with min assist

## 2019-08-26 NOTE — ED Notes (Signed)
Got patient on the monitor patient is resting with call bell in reach  ?

## 2019-08-29 ENCOUNTER — Encounter (HOSPITAL_COMMUNITY): Payer: Self-pay | Admitting: Emergency Medicine

## 2019-08-29 ENCOUNTER — Emergency Department (HOSPITAL_COMMUNITY): Payer: Medicare Other

## 2019-08-29 ENCOUNTER — Other Ambulatory Visit: Payer: Self-pay

## 2019-08-29 ENCOUNTER — Emergency Department (HOSPITAL_COMMUNITY)
Admission: EM | Admit: 2019-08-29 | Discharge: 2019-08-29 | Disposition: A | Discharge status: Home / Self Care | Payer: Medicare Other | Attending: Emergency Medicine | Admitting: Emergency Medicine

## 2019-08-29 DIAGNOSIS — E119 Type 2 diabetes mellitus without complications: Secondary | ICD-10-CM | POA: Diagnosis not present

## 2019-08-29 DIAGNOSIS — R112 Nausea with vomiting, unspecified: Secondary | ICD-10-CM | POA: Diagnosis present

## 2019-08-29 DIAGNOSIS — Z79899 Other long term (current) drug therapy: Secondary | ICD-10-CM | POA: Insufficient documentation

## 2019-08-29 DIAGNOSIS — Z7982 Long term (current) use of aspirin: Secondary | ICD-10-CM | POA: Insufficient documentation

## 2019-08-29 DIAGNOSIS — R109 Unspecified abdominal pain: Secondary | ICD-10-CM | POA: Insufficient documentation

## 2019-08-29 DIAGNOSIS — R197 Diarrhea, unspecified: Secondary | ICD-10-CM | POA: Diagnosis not present

## 2019-08-29 DIAGNOSIS — I10 Essential (primary) hypertension: Secondary | ICD-10-CM | POA: Insufficient documentation

## 2019-08-29 DIAGNOSIS — Z7984 Long term (current) use of oral hypoglycemic drugs: Secondary | ICD-10-CM | POA: Insufficient documentation

## 2019-08-29 LAB — CBC
HCT: 41.4 % (ref 36.0–46.0)
Hemoglobin: 13.4 g/dL (ref 12.0–15.0)
MCH: 28 pg (ref 26.0–34.0)
MCHC: 32.4 g/dL (ref 30.0–36.0)
MCV: 86.4 fL (ref 80.0–100.0)
Platelets: 324 10*3/uL (ref 150–400)
RBC: 4.79 MIL/uL (ref 3.87–5.11)
RDW: 13.1 % (ref 11.5–15.5)
WBC: 11.5 10*3/uL — ABNORMAL HIGH (ref 4.0–10.5)
nRBC: 0 % (ref 0.0–0.2)

## 2019-08-29 LAB — URINALYSIS, ROUTINE W REFLEX MICROSCOPIC
Bilirubin Urine: NEGATIVE
Glucose, UA: NEGATIVE mg/dL
Hgb urine dipstick: NEGATIVE
Ketones, ur: NEGATIVE mg/dL
Leukocytes,Ua: NEGATIVE
Nitrite: NEGATIVE
Protein, ur: NEGATIVE mg/dL
Specific Gravity, Urine: 1.013 (ref 1.005–1.030)
pH: 6 (ref 5.0–8.0)

## 2019-08-29 LAB — RAPID URINE DRUG SCREEN, HOSP PERFORMED
Amphetamines: NOT DETECTED
Barbiturates: NOT DETECTED
Benzodiazepines: NOT DETECTED
Cocaine: NOT DETECTED
Opiates: NOT DETECTED
Tetrahydrocannabinol: POSITIVE — AB

## 2019-08-29 LAB — COMPREHENSIVE METABOLIC PANEL
ALT: 15 U/L (ref 0–44)
AST: 16 U/L (ref 15–41)
Albumin: 4.2 g/dL (ref 3.5–5.0)
Alkaline Phosphatase: 76 U/L (ref 38–126)
Anion gap: 9 (ref 5–15)
BUN: 13 mg/dL (ref 8–23)
CO2: 28 mmol/L (ref 22–32)
Calcium: 10 mg/dL (ref 8.9–10.3)
Chloride: 103 mmol/L (ref 98–111)
Creatinine, Ser: 0.7 mg/dL (ref 0.44–1.00)
GFR calc Af Amer: >60 mL/min (ref >60)
GFR calc non Af Amer: >60 mL/min (ref >60)
Glucose, Bld: 117 mg/dL — ABNORMAL HIGH (ref 70–99)
Potassium: 3.8 mmol/L (ref 3.5–5.1)
Sodium: 140 mmol/L (ref 135–145)
Total Bilirubin: 0.6 mg/dL (ref 0.3–1.2)
Total Protein: 7.4 g/dL (ref 6.5–8.1)

## 2019-08-29 LAB — ETHANOL: Alcohol, Ethyl (B): <10 mg/dL (ref <10)

## 2019-08-29 LAB — LIPASE, BLOOD: Lipase: 24 U/L (ref 11–51)

## 2019-08-29 MED ORDER — IOHEXOL 300 MG/ML  SOLN
100.0000 mL | Freq: Once | INTRAMUSCULAR | Status: AC | PRN
  Administered 2019-08-29: 100 mL via INTRAVENOUS

## 2019-08-29 MED ORDER — HYDROCODONE-ACETAMINOPHEN 5-325 MG PO TABS: 1.0000 | ORAL_TABLET | ORAL | 0 refills | Status: DC | PRN

## 2019-08-29 MED ORDER — FENTANYL CITRATE (PF) 100 MCG/2ML IJ SOLN
100.0000 ug | INTRAMUSCULAR | Status: DC | PRN
  Administered 2019-08-29 (×2): 100 ug via INTRAVENOUS
  Filled 2019-08-29 (×2): qty 2

## 2019-08-29 MED ORDER — SODIUM CHLORIDE 0.9 % IV BOLUS
1000.0000 mL | Freq: Once | INTRAVENOUS | Status: AC
  Administered 2019-08-29: 1000 mL via INTRAVENOUS

## 2019-08-29 MED ORDER — ONDANSETRON HCL 4 MG/2ML IJ SOLN
4.0000 mg | Freq: Once | INTRAMUSCULAR | Status: AC
  Administered 2019-08-29: 4 mg via INTRAVENOUS
  Filled 2019-08-29: qty 2

## 2019-08-29 NOTE — ED Triage Notes (Signed)
Pt reports she was seen here on 3/3 for n/v/d and abd pain, she was discharged home and told to return if symptoms got worse. She reports that this morning she began having n/v/d and lower abdominal pain again. Reports she took some nausea meds which did not help

## 2019-08-29 NOTE — Discharge Instructions (Signed)
Try to drink some fluids regularly to improve your hydration status.  We sent a prescription for pain reliever and to improve your diarrhea, to the pharmacy.  Do not drive or drink alcohol when taking this medication.  You can also use something like Kaopectate or Imodium for diarrhea if needed.

## 2019-08-29 NOTE — ED Notes (Signed)
Patient verbalizes understanding of discharge instructions. Opportunity for questioning and answers were provided. Armband removed by staff, pt discharged from ED discharged via wheelchair with niece

## 2019-08-29 NOTE — ED Provider Notes (Signed)
[name] Provider Note   CSN: [id number] Arrival date & time: 08/29/19  1412     History Chief Complaint  Patient presents with  . Abdominal Pain  . Emesis  . Diarrhea    [name] is a 64 y.o. female.  HPI She presents for evaluation of persistent nausea, vomiting and diarrhea with abdominal pain.  She was seen and treated in the ED, 3 days ago for the same.  Evaluation was unrevealing and she was treated symptomatically.  Today her vomiting and diarrhea got worse and she is having continual vomiting and diarrhea both appear "foamy," without blood.  She denies fever, chills, focal weakness or paresthesia.  No known sick contacts.  She has a prior similar episode several years ago that required hospitalization for about a week.  She could not tolerate her amlodipine, today.  She states she took it yesterday.  There are no other known modifying factors.    Past Medical History:  Diagnosis Date  . Anemia    hx  . Arthritis    "all over" (06/19/2017)  . Diabetes mellitus without complication ([place])   . Hyperlipidemia   . Hypertension   . TIA (transient ischemic attack)    pt unaware of this hx on 06/19/2017    Patient Active Problem List   Diagnosis Date Noted  . Nausea and vomiting 05/17/2018  . Enteritis 06/19/2017  . Abdominal pain 06/18/2017  . Hyperlipidemia 06/18/2017  . Elevated glucose 06/18/2017  . Need for hepatitis C screening test 05/23/2017  . Breast pain, left 01/05/2016  . Left ankle sprain 01/20/2015  . Gastroenteritis 11/08/2014  . Malaise 10/14/2014  . Hypokalemia 10/14/2014  . Essential hypertension   . Bilateral wrist pain 10/06/2014  . Hot flashes, menopausal 10/06/2014  . Cerebral aneurysm without rupture 08/20/2014  . TIA (transient ischemic attack) 08/19/2014  . Accelerated hypertension 02/24/2013  . Depression 02/24/2013    Past Surgical History:  Procedure Laterality Date  . ANTERIOR  CERVICAL DECOMP/DISCECTOMY FUSION    . BACK SURGERY    . CESAREAN SECTION  1986  . IR GENERIC HISTORICAL  09/21/2014   IR ANGIO INTRA EXTRACRAN SEL INTERNAL CAROTID UNI L MOD SED 09/21/2014 [name], MD [facility]  . IR GENERIC HISTORICAL  09/21/2014   IR ANGIO INTRA EXTRACRAN SEL COM CAROTID INNOMINATE UNI R MOD SED 09/21/2014 [name], MD [facility]  . IR GENERIC HISTORICAL  09/21/2014   IR ANGIO VERTEBRAL SEL VERTEBRAL UNI L MOD SED 09/21/2014 [name], MD [facility]  . IR GENERIC HISTORICAL  09/21/2014   IR 3D INDEPENDENT WKST 09/21/2014 [name], MD [facility]  . LAPAROSCOPIC CHOLECYSTECTOMY    . TUBAL LIGATION       OB History    Gravida  2   Para  1   Term  1   Preterm      AB  1   Living  1     SAB  1   TAB      Ectopic      Multiple      Live Births  1           Family History  Problem Relation Age of Onset  . Hypertension Mother   . Arthritis Mother   . Diabetes Mother   . Heart disease Father   . Diabetes Sister   . Diabetes Brother   . Diabetes Sister   . Cancer Neg Hx  Social History   Tobacco Use  . Smoking status: Never Smoker  . Smokeless tobacco: Never Used  Substance Use Topics  . Alcohol use: Yes    Comment: socially   . Drug use: No    Home Medications Prior to Admission medications   Medication Sig Start Date End Date Taking? Authorizing Provider  ACCU-CHEK GUIDE test strip USE UP TO TWICE DAILY AS DIRECTED 08/30/17   Clent Demark, PA-C  amLODipine (NORVASC) 10 MG tablet Take 1 tablet (10 mg total) by mouth daily. 08/26/19   Quintella Reichert, MD  aspirin EC 81 MG tablet Take 1 tablet (81 mg total) by mouth daily. 02/01/16   Funches, Adriana Mccallum, MD  atorvastatin (LIPITOR) 40 MG tablet Take 1 tablet (40 mg total) daily by mouth. 04/29/17   Clent Demark, PA-C  blood glucose meter kit and supplies KIT Dispense based on patient and insurance preference. Use up to twice daily as  directed. (FOR ICD-9 250.00, 250.01). 07/01/17   Clent Demark, PA-C  cyclobenzaprine (FLEXERIL) 5 MG tablet Take 1-2 tablets (5-10 mg total) by mouth 2 (two) times daily as needed for muscle spasms. 11/07/18   Wieters, Hallie C, PA-C  feeding supplement, ENSURE ENLIVE, (ENSURE ENLIVE) LIQD Take 237 mLs by mouth 3 (three) times daily between meals. 07/01/17   Clent Demark, PA-C  HYDROcodone-acetaminophen [facility]) 5-325 MG tablet Take 1 tablet by mouth every 4 (four) hours as needed for moderate pain (Or diarrhea). 08/29/19   Daleen Bo, MD  ibuprofen (ADVIL) 600 MG tablet Take 1 tablet (600 mg total) by mouth every 6 (six) hours as needed. Patient taking differently: Take 600 mg by mouth every 6 (six) hours as needed for moderate pain.  11/07/18   Wieters, Hallie C, PA-C  lisinopril-hydrochlorothiazide (ZESTORETIC) 10-12.5 MG tablet Take 2 tablets by mouth daily. 07/01/17   Clent Demark, PA-C  loperamide (IMODIUM A-D) 2 MG tablet Take 1 tablet (2 mg total) by mouth 4 (four) times daily as needed for diarrhea or loose stools. 05/18/18   Thurnell Lose, MD  metFORMIN (GLUCOPHAGE XR) 500 MG 24 hr tablet Take 1 tablet (500 mg total) by mouth daily with breakfast. 07/01/17   Clent Demark, PA-C  metoprolol succinate (TOPROL-XL) 25 MG 24 hr tablet Take 2 tablets (50 mg total) by mouth daily. 05/14/19   Blanchie Dessert, MD  ondansetron (ZOFRAN ODT) 4 MG disintegrating tablet Take 1 tablet (4 mg total) by mouth every 8 (eight) hours as needed for nausea or vomiting. 08/26/19   Quintella Reichert, MD  PARoxetine (PAXIL) 10 MG tablet Take 1 tablet (10 mg total) by mouth daily. 07/01/17   Clent Demark, PA-C  potassium chloride SA (K-DUR,KLOR-CON) 20 MEQ tablet Take 2 tablets (40 mEq total) by mouth 2 (two) times daily. 05/22/18   Pattricia Boss, MD  promethazine (PHENERGAN) 25 MG tablet Take 1 tablet (25 mg total) by mouth every 6 (six) hours as needed for nausea or vomiting. Patient not taking:  Reported on 03/07/2019 05/18/18   Thurnell Lose, MD    Allergies    Patient has no known allergies.  Review of Systems   Review of Systems  All other systems reviewed and are negative.   Physical Exam Updated Vital Signs BP (!) 176/115 (BP Location: Right Arm)   Pulse 89   Temp 97.8 F (36.6 C) (Oral)   Resp 16   SpO2 98%   Physical Exam Vitals and nursing note reviewed.  Constitutional:  Appearance: She is well-developed.  HENT:     Head: Normocephalic and atraumatic.     Right Ear: External ear normal.     Left Ear: External ear normal.  Eyes:     Conjunctiva/sclera: Conjunctivae normal.     Pupils: Pupils are equal, round, and reactive to light.  Neck:     Trachea: Phonation normal.  Cardiovascular:     Rate and Rhythm: Normal rate and regular rhythm.     Heart sounds: Normal heart sounds.  Pulmonary:     Effort: Pulmonary effort is normal.     Breath sounds: Normal breath sounds.  Abdominal:     Palpations: Abdomen is soft. There is no mass.     Tenderness: There is abdominal tenderness (Diffuse, moderate). There is no rebound.     Hernia: No hernia is present.  Musculoskeletal:        General: Normal range of motion.     Cervical back: Normal range of motion and neck supple.     Comments: Normal gait  Skin:    General: Skin is warm and dry.  Neurological:     Mental Status: She is alert and oriented to person, place, and time.     Cranial Nerves: No cranial nerve deficit.     Sensory: No sensory deficit.     Motor: No abnormal muscle tone.     Coordination: Coordination normal.  Psychiatric:        Mood and Affect: Mood normal.        Behavior: Behavior normal.        Thought Content: Thought content normal.        Judgment: Judgment normal.     ED Results / Procedures / Treatments   Labs (all labs ordered are listed, but only abnormal results are displayed) Labs Reviewed  COMPREHENSIVE METABOLIC PANEL - Abnormal; Notable for the  following components:      Result Value   Glucose, Bld 117 (*)    All other components within normal limits  CBC - Abnormal; Notable for the following components:   WBC 11.5 (*)    All other components within normal limits  RAPID URINE DRUG SCREEN, HOSP PERFORMED - Abnormal; Notable for the following components:   Tetrahydrocannabinol POSITIVE (*)    All other components within normal limits  GI PATHOGEN PANEL BY PCR, STOOL  C DIFFICILE QUICK SCREEN W PCR REFLEX  LIPASE, BLOOD  URINALYSIS, ROUTINE W REFLEX MICROSCOPIC  ETHANOL    EKG None  Radiology CT Abdomen Pelvis W Contrast  Result Date: 08/29/2019 CLINICAL DATA:  Worsening abdominal pain, nausea and vomiting, and diarrhea for several days. EXAM: CT ABDOMEN AND PELVIS WITH CONTRAST TECHNIQUE: Multidetector CT imaging of the abdomen and pelvis was performed using the standard protocol following bolus administration of intravenous contrast. CONTRAST:  148m OMNIPAQUE IOHEXOL 300 MG/ML  SOLN COMPARISON:  05/17/2018 FINDINGS: Lower Chest: No acute findings. Hepatobiliary: No hepatic masses identified. Prior cholecystectomy again noted. Increased diffuse biliary ductal dilatation is seen compared to previous study, with common bile duct measuring 10 mm. Pancreas: No mass or inflammatory changes. No evidence of pancreatic ductal dilatation. Spleen: Within normal limits in size and appearance. Adrenals/Urinary Tract: No masses identified. A few tiny renal cysts are noted bilaterally. No evidence of ureteral calculi or hydronephrosis. Stomach/Bowel: No evidence of obstruction, inflammatory process or abnormal fluid collections. Normal appendix visualized. Diverticulosis is seen mainly involving the descending and sigmoid colon, however there is no evidence of diverticulitis. Vascular/Lymphatic: No  pathologically enlarged lymph nodes. No abdominal aortic aneurysm. Aortic atherosclerosis incidentally noted. Reproductive:  No mass or other  significant abnormality. Other:  None. Musculoskeletal:  No suspicious bone lesions identified. IMPRESSION: 1. Prior cholecystectomy. Increased diffuse biliary ductal dilatation compared to previous study. Choledocholithiasis cannot be excluded by CT. Recommend correlation with liver function tests, and consider abdomen MRI and MRCP without and with contrast for further evaluation if clinically warranted. 2. Colonic diverticulosis. No radiographic evidence of diverticulitis. Electronically Signed   By: Marlaine Hind M.D.   On: 08/29/2019 18:35    Procedures Procedures (including critical care time)  Medications Ordered in ED Medications  fentaNYL (SUBLIMAZE) injection 100 mcg (100 mcg Intravenous Given 08/29/19 2017)  sodium chloride 0.9 % bolus 1,000 mL (1,000 mLs Intravenous New Bag/Given 08/29/19 1701)  ondansetron (ZOFRAN) injection 4 mg (4 mg Intravenous Given 08/29/19 1700)  iohexol (OMNIPAQUE) 300 MG/ML solution 100 mL (100 mLs Intravenous Contrast Given 08/29/19 1817)    ED Course  I have reviewed the triage vital signs and the nursing notes.  Pertinent labs & imaging results that were available during my care of the patient were reviewed by me and considered in my medical decision making (see chart for details).  Clinical Course as of Aug 28 2108  Sat Aug 29, 2019  1928 Normal  Urinalysis, Routine w reflex microscopic [EW]  1928 Normal except glucose high  Comprehensive metabolic panel(!) [EW]  7564 Normal  Lipase, blood [EW]  1928 Normal except white count high  CBC(!) [EW]  1929 Per radiology interpretation, only possible acute abnormality is increased common bile duct size, to 10 mm.  She is status post cholecystectomy.  There are correlation with LFTs, there is no evidence for bile duct obstruction.  CT Abdomen Pelvis W Contrast [EW]  1939 Normal  Alcohol, Ethyl (B): <10 [EW]  2058 Normal except presence of THC  Urine rapid drug screen (hosp performed)(!) [EW]  2058 Normal    Urinalysis, Routine w reflex microscopic [EW]    Clinical Course User Index [EW] Daleen Bo, MD   MDM Rules/Calculators/A&P                       Patient Vitals for the past 24 hrs:  BP Temp Temp src Pulse Resp SpO2  08/29/19 1940 (!) 176/115 -- -- 89 16 98 %  08/29/19 1745 (!) 176/90 -- -- 68 -- 96 %  08/29/19 1714 (!) 203/103 97.8 F (36.6 C) Oral 80 12 100 %  08/29/19 1418 (!) 170/105 97.7 F (36.5 C) -- 92 15 100 %    9:10 PM Reevaluation with update and discussion. After initial assessment and treatment, an updated evaluation reveals she states she feels better and has had "only a squirt of diarrhea."  Findings discussed and questions answered. Daleen Bo   Medical Decision Making: Nonspecific vomiting diarrhea.  CT imaging does not show acute abnormality based on clinical scenario and findings.  Mild hypertension related to not taking usual blood pressure medicines today.  Doubt hypertensive urgency.  Doubt metabolic instability or serious bacterial infection.   [name] was evaluated in [facility] on 08/29/2019 for the symptoms described in the history of present illness. She was evaluated in the context of the global COVID-19 pandemic, which necessitated consideration that the patient might be at risk for infection with the SARS-CoV-2 virus that causes COVID-19. Institutional protocols and algorithms that pertain to the evaluation of patients at risk for COVID-19 are in a  state of rapid change based on information released by regulatory bodies including the CDC and federal and state organizations. These policies and algorithms were followed during the patient's care in the ED.   CRITICAL CARE- no Performed by: Daleen Bo   Nursing Notes Reviewed/ Care Coordinated Applicable Imaging Reviewed Interpretation of Laboratory Data incorporated into ED treatment  The patient appears reasonably screened and/or stabilized for discharge and I doubt any  other medical condition or other [facility] requiring further screening, evaluation, or treatment in the ED at this time prior to discharge.  Plan: Home Medications-continue usual medications; Home Treatments-diet advance diet; return here if the recommended treatment, does not improve the symptoms; Recommended follow up-PCP checkup as needed.    Final Clinical Impression(s) / ED Diagnoses Final diagnoses:  Nausea vomiting and diarrhea    Rx / DC Orders ED Discharge Orders         Ordered    HYDROcodone-acetaminophen (NORCO) 5-325 MG tablet  Every 4 hours PRN     08/29/19 2108           Daleen Bo, MD 08/29/19 2110

## 2019-08-30 LAB — C DIFFICILE QUICK SCREEN W PCR REFLEX
C Diff antigen: NEGATIVE
C Diff interpretation: NOT DETECTED
C Diff toxin: NEGATIVE

## 2019-09-02 LAB — GI PATHOGEN PANEL BY PCR, STOOL

## 2019-09-07 ENCOUNTER — Other Ambulatory Visit: Payer: Self-pay

## 2019-09-07 ENCOUNTER — Ambulatory Visit (INDEPENDENT_AMBULATORY_CARE_PROVIDER_SITE_OTHER): Payer: Medicare Other | Admitting: Primary Care

## 2019-09-07 ENCOUNTER — Encounter (INDEPENDENT_AMBULATORY_CARE_PROVIDER_SITE_OTHER): Payer: Self-pay | Admitting: Primary Care

## 2019-09-07 VITALS — BP 192/99 | HR 79 | Temp 97.2°F | Ht 62.0 in | Wt 113.0 lb

## 2019-09-07 DIAGNOSIS — H8110 Benign paroxysmal vertigo, unspecified ear: Secondary | ICD-10-CM

## 2019-09-07 DIAGNOSIS — F4321 Adjustment disorder with depressed mood: Secondary | ICD-10-CM

## 2019-09-07 DIAGNOSIS — R7303 Prediabetes: Secondary | ICD-10-CM | POA: Diagnosis not present

## 2019-09-07 DIAGNOSIS — I10 Essential (primary) hypertension: Secondary | ICD-10-CM | POA: Diagnosis not present

## 2019-09-07 DIAGNOSIS — Z760 Encounter for issue of repeat prescription: Secondary | ICD-10-CM

## 2019-09-07 LAB — POCT GLYCOSYLATED HEMOGLOBIN (HGB A1C): Hemoglobin A1C: 5.8 % — AB (ref 4.0–5.6)

## 2019-09-07 MED ORDER — MECLIZINE HCL 25 MG PO TABS: 25.0000 mg | ORAL_TABLET | Freq: Three times a day (TID) | ORAL | 1 refills | Status: DC | PRN

## 2019-09-07 MED ORDER — AMLODIPINE BESYLATE 10 MG PO TABS: 10.0000 mg | ORAL_TABLET | Freq: Every day | ORAL | 0 refills | Status: DC

## 2019-09-07 MED ORDER — LOSARTAN POTASSIUM-HCTZ 100-25 MG PO TABS: 1.0000 | ORAL_TABLET | Freq: Every day | ORAL | 3 refills | Status: DC

## 2019-09-07 NOTE — Progress Notes (Signed)
Pt complains of nausea before eating  Pt complains of stomach pain

## 2019-09-07 NOTE — Progress Notes (Signed)
Established Patient Office Visit  Subjective:  Patient ID: [name], female    DOB: [birth year]  Age: 64 y.o. MRN: [id number]  CC:  Chief Complaint  Patient presents with  . Hypertension    HPI [name] presents for management of hypertension. She is establishing care with new provider but not with [facility]. She Denies shortness of breath,  chest pain or lower extremity edema. She recently been having daily headaches and vertigo states she know she is dehydrated 1-2 glasses of water or liquids.   Past Medical History:  Diagnosis Date  . Anemia    hx  . Arthritis    "all over" (06/19/2017)  . Diabetes mellitus without complication ([place])   . Hyperlipidemia   . Hypertension   . TIA (transient ischemic attack)    pt unaware of this hx on 06/19/2017    Past Surgical History:  Procedure Laterality Date  . ANTERIOR CERVICAL DECOMP/DISCECTOMY FUSION    . BACK SURGERY    . CESAREAN SECTION  1986  . IR GENERIC HISTORICAL  09/21/2014   IR ANGIO INTRA EXTRACRAN SEL INTERNAL CAROTID UNI L MOD SED 09/21/2014 [name], MD [facility]  . IR GENERIC HISTORICAL  09/21/2014   IR ANGIO INTRA EXTRACRAN SEL COM CAROTID INNOMINATE UNI R MOD SED 09/21/2014 [name], MD [facility]  . IR GENERIC HISTORICAL  09/21/2014   IR ANGIO VERTEBRAL SEL VERTEBRAL UNI L MOD SED 09/21/2014 [name], MD [facility]  . IR GENERIC HISTORICAL  09/21/2014   IR 3D INDEPENDENT WKST 09/21/2014 [name], MD [facility]  . LAPAROSCOPIC CHOLECYSTECTOMY    . TUBAL LIGATION      Family History  Problem Relation Age of Onset  . Hypertension Mother   . Arthritis Mother   . Diabetes Mother   . Heart disease Father   . Diabetes Sister   . Diabetes Brother   . Diabetes Sister   . Cancer Neg Hx     Social History   Socioeconomic History  . Marital status: Widowed    Spouse name: Not on file  . Number of children: Not on file  . Years of education: Not on file   . Highest education level: Not on file  Occupational History  . Not on file  Tobacco Use  . Smoking status: Never Smoker  . Smokeless tobacco: Never Used  Substance and Sexual Activity  . Alcohol use: Yes    Comment: socially   . Drug use: No  . Sexual activity: Yes  Other Topics Concern  . 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 Difficulty of Paying Living Expenses:   Food Insecurity:   . Worried About Charity fundraiser in the Last Year:   . Arboriculturist in the Last Year:   Transportation Needs:   . Film/video editor (Medical):   [name] Lack of Transportation (Non-Medical):   Physical Activity:   . Days of Exercise per Week:   . Minutes of Exercise per Session:   Stress:   . Feeling of Stress :   Social Connections:   . Frequency of Communication with Friends and Family:   . Frequency of Social Gatherings with Friends and Family:   . Attends Religious Services:   .  Active Member of Clubs or Organizations:   . Attends Archivist Meetings:   [name] Marital Status:   Intimate Partner Violence:   . Fear of Current or Ex-Partner:   . Emotionally Abused:   [name] Physically Abused:   . Sexually Abused:     Outpatient Medications Prior to Visit  Medication Sig Dispense Refill  . amLODipine (NORVASC) 10 MG tablet Take 1 tablet (10 mg total) by mouth daily. 30 tablet 0  . ACCU-CHEK GUIDE test strip USE UP TO TWICE DAILY AS DIRECTED 100 each 5  . aspirin EC 81 MG tablet Take 1 tablet (81 mg total) by mouth daily. 90 tablet 3  . blood glucose meter kit and supplies KIT Dispense based on patient and insurance preference. Use up to twice daily as directed. (FOR ICD-9 250.00, 250.01). 1 each 0  . feeding supplement, ENSURE ENLIVE, (ENSURE ENLIVE) LIQD Take 237 mLs by mouth 3 (three)  times daily between meals. 237 mL 12  . HYDROcodone-acetaminophen (NORCO) 5-325 MG tablet Take 1 tablet by mouth every 4 (four) hours as needed for moderate pain (Or diarrhea). 20 tablet 0  . ibuprofen (ADVIL) 600 MG tablet Take 1 tablet (600 mg total) by mouth every 6 (six) hours as needed. (Patient taking differently: Take 600 mg by mouth every 6 (six) hours as needed for moderate pain. ) 30 tablet 0  . loperamide (IMODIUM A-D) 2 MG tablet Take 1 tablet (2 mg total) by mouth 4 (four) times daily as needed for diarrhea or loose stools. 15 tablet 0  . ondansetron (ZOFRAN ODT) 4 MG disintegrating tablet Take 1 tablet (4 mg total) by mouth every 8 (eight) hours as needed for nausea or vomiting. 12 tablet 0  . PARoxetine (PAXIL) 10 MG tablet Take 1 tablet (10 mg total) by mouth daily. 90 tablet 3  . atorvastatin (LIPITOR) 40 MG tablet Take 1 tablet (40 mg total) daily by mouth. 90 tablet 3  . cyclobenzaprine (FLEXERIL) 5 MG tablet Take 1-2 tablets (5-10 mg total) by mouth 2 (two) times daily as needed for muscle spasms. 24 tablet 0  . lisinopril-hydrochlorothiazide (ZESTORETIC) 10-12.5 MG tablet Take 2 tablets by mouth daily. 180 tablet 3  . metFORMIN (GLUCOPHAGE XR) 500 MG 24 hr tablet Take 1 tablet (500 mg total) by mouth daily with breakfast. 30 tablet 5  . metoprolol succinate (TOPROL-XL) 25 MG 24 hr tablet Take 2 tablets (50 mg total) by mouth daily. 180 tablet 3  . potassium chloride SA (K-DUR,KLOR-CON) 20 MEQ tablet Take 2 tablets (40 mEq total) by mouth 2 (two) times daily. 10 tablet 0  . promethazine (PHENERGAN) 25 MG tablet Take 1 tablet (25 mg total) by mouth every 6 (six) hours as needed for nausea or vomiting. (Patient not taking: Reported on 03/07/2019) 20 tablet 0   No facility-administered medications prior to visit.    No Known Allergies  ROS Review of Systems  Eyes: Positive for visual disturbance.  Neurological: Positive for dizziness and headaches.  Psychiatric/Behavioral:  Positive for sleep disturbance.       Taking care of mother and waking up hollering in the middle of the night  All other systems reviewed and are negative.     Objective:    Physical Exam  Constitutional: She is oriented to person, place, and time. She appears well-developed and well-nourished.  HENT:  Head: Normocephalic.  Eyes: Pupils are equal, round, and reactive to light. EOM are normal.  Cardiovascular: Normal rate and regular rhythm.  Pulmonary/Chest: Effort normal and breath sounds normal.  Abdominal: Soft. Bowel sounds are normal.  Musculoskeletal:        General: Normal range of motion.     Cervical back: Normal range of motion.  Neurological: She is alert and oriented to person, place, and time. She has normal reflexes.  Skin: Skin is warm and dry.  Psychiatric: She has a normal mood and affect. Her behavior is normal. Judgment and thought content normal.    BP (!) 192/99 (BP Location: Left Arm, Patient Position: Sitting, Cuff Size: Small)   Pulse 79   Temp (!) 97.2 F (36.2 C) (Temporal)   Ht '5\' 2"'  (1.575 m)   Wt 113 lb (51.3 kg)   SpO2 100%   BMI 20.67 kg/m  Wt Readings from Last 3 Encounters:  09/07/19 113 lb (51.3 kg)  08/26/19 115 lb (52.2 kg)  05/14/19 160 lb (72.6 kg)     Health Maintenance Due  Topic Date Due  . MAMMOGRAM  02/08/2018  . PAP SMEAR-Modifier  02/01/2019    There are no preventive care reminders to display for this patient.  Lab Results  Component Value Date   TSH 1.579 05/17/2018   Lab Results  Component Value Date   WBC 11.5 (H) 08/29/2019   HGB 13.4 08/29/2019   HCT 41.4 08/29/2019   MCV 86.4 08/29/2019   PLT 324 08/29/2019   Lab Results  Component Value Date   NA 140 08/29/2019   K 3.8 08/29/2019   CO2 28 08/29/2019   GLUCOSE 117 (H) 08/29/2019   BUN 13 08/29/2019   CREATININE 0.70 08/29/2019   BILITOT 0.6 08/29/2019   ALKPHOS 76 08/29/2019   AST 16 08/29/2019   ALT 15 08/29/2019   PROT 7.4 08/29/2019    ALBUMIN 4.2 08/29/2019   CALCIUM 10.0 08/29/2019   ANIONGAP 9 08/29/2019   Lab Results  Component Value Date   CHOL 186 06/18/2017   Lab Results  Component Value Date   HDL 70 06/18/2017   Lab Results  Component Value Date   LDLCALC 104 (H) 06/18/2017   Lab Results  Component Value Date   TRIG 62 06/18/2017   Lab Results  Component Value Date   CHOLHDL 2.7 06/18/2017   Lab Results  Component Value Date   HGBA1C 5.8 (A) 09/07/2019      Assessment & Plan:  [name] was seen today for hypertension.  Diagnoses and all orders for this visit:  Essential hypertension Counseled on blood pressure goal of less than 130/80, low-sodium, DASH diet, medication compliance, 150 minutes of moderate intensity exercise per week. Discussed medication compliance, adverse effects. -     amLODipine (NORVASC) 10 MG tablet; Take 1 tablet (10 mg total) by mouth daily. -     losartan-hydrochlorothiazide (HYZAAR) 100-25 MG tablet; Take 1 tablet by mouth daily.  Prediabetes Decease  Animal products, ie. Meat - red/white, Poultry and Dairy/especially cheese - Exercise at least 5 times a week for 30 minutes or preferably daily.  - No Smoking - Drink less than 2 drinks a day.  - Monitor your feet for sores - Have yearly Eye Exams - Recommend annual Flu vaccine  - Recommend Pneumovax and Prevnar vaccines - Shingles Vaccine (Zostavax) if over 38 y.o -     HgB A1c 5.8 -     Lipid panel; Future  Medication refill -     amLODipine (NORVASC) 10 MG tablet; Take 1 tablet (10 mg total) by mouth daily.  Adjustment disorder with  depressed mood  We discussed options for treatment of anxiety including therapy and/or medication.  Will check basic labs to ensure thyroid is in normal range and that no other metabolic issues are obvious.  Reviewed concept of anxiety as biochemical imbalance of neurotransmitters and rationale for treatment. Discussed potential risks, expected benefits, possible side  effects of the medicine. We also discussed how to take it correctly and dosing instructions. If she has any significant side effects to the medicine, she is to stop it and call for advice.  Instructed patient to contact office or on-call physician promptly should condition worsen or any new symptoms appear.      Meds ordered this encounter  Medications  . amLODipine (NORVASC) 10 MG tablet    Sig: Take 1 tablet (10 mg total) by mouth daily.    Dispense:  90 tablet    Refill:  0  . losartan-hydrochlorothiazide (HYZAAR) 100-25 MG tablet    Sig: Take 1 tablet by mouth daily.    Dispense:  90 tablet    Refill:  3  . meclizine (ANTIVERT) 25 MG tablet    Sig: Take 1 tablet (25 mg total) by mouth 3 (three) times daily as needed for dizziness.    Dispense:  30 tablet    Refill:  1    Follow-up: Return in about 4 weeks (around 10/05/2019) for in person Bp and lipids (fasting).    [name], NP

## 2019-09-07 NOTE — Patient Instructions (Signed)

## 2019-10-05 ENCOUNTER — Ambulatory Visit (INDEPENDENT_AMBULATORY_CARE_PROVIDER_SITE_OTHER): Payer: Medicare Other | Admitting: Primary Care

## 2019-10-05 ENCOUNTER — Encounter (INDEPENDENT_AMBULATORY_CARE_PROVIDER_SITE_OTHER): Payer: Self-pay | Admitting: Primary Care

## 2019-10-05 ENCOUNTER — Other Ambulatory Visit: Payer: Self-pay

## 2019-10-05 VITALS — BP 173/96 | HR 71 | Temp 97.2°F | Ht 62.0 in | Wt 110.8 lb

## 2019-10-05 DIAGNOSIS — R42 Dizziness and giddiness: Secondary | ICD-10-CM

## 2019-10-05 DIAGNOSIS — N951 Menopausal and female climacteric states: Secondary | ICD-10-CM

## 2019-10-05 DIAGNOSIS — E782 Mixed hyperlipidemia: Secondary | ICD-10-CM | POA: Diagnosis not present

## 2019-10-05 DIAGNOSIS — M21611 Bunion of right foot: Secondary | ICD-10-CM

## 2019-10-05 DIAGNOSIS — M21612 Bunion of left foot: Secondary | ICD-10-CM

## 2019-10-05 DIAGNOSIS — Z1231 Encounter for screening mammogram for malignant neoplasm of breast: Secondary | ICD-10-CM

## 2019-10-05 DIAGNOSIS — Z1211 Encounter for screening for malignant neoplasm of colon: Secondary | ICD-10-CM

## 2019-10-05 DIAGNOSIS — I10 Essential (primary) hypertension: Secondary | ICD-10-CM | POA: Diagnosis not present

## 2019-10-05 DIAGNOSIS — R7303 Prediabetes: Secondary | ICD-10-CM

## 2019-10-05 MED ORDER — CARVEDILOL 3.125 MG PO TABS: 3.1250 mg | ORAL_TABLET | Freq: Two times a day (BID) | ORAL | 3 refills | Status: DC

## 2019-10-05 NOTE — Patient Instructions (Addendum)
Hypertension, Adult Hypertension is another name for high blood pressure. High blood pressure forces your heart to work harder to pump blood. This can cause problems over time. There are two numbers in a blood pressure reading. There is a top number (systolic) over a bottom number (diastolic). It is best to have a blood pressure that is below 120/80. Healthy choices can help lower your blood pressure, or you may need medicine to help lower it. What are the causes? The cause of this condition is not known. Some conditions may be related to high blood pressure. What increases the risk?  Smoking.  Having type 2 diabetes mellitus, high cholesterol, or both.  Not getting enough exercise or physical activity.  Being overweight.  Having too much fat, sugar, calories, or salt (sodium) in your diet.  Drinking too much alcohol.  Having long-term (chronic) kidney disease.  Having a family history of high blood pressure.  Age. Risk increases with age.  Race. You may be at higher risk if you are African American.  Gender. Men are at higher risk than women before age 45. After age 65, women are at higher risk than men.  Having obstructive sleep apnea.  Stress. What are the signs or symptoms?  High blood pressure may not cause symptoms. Very high blood pressure (hypertensive crisis) may cause: ? Headache. ? Feelings of worry or nervousness (anxiety). ? Shortness of breath. ? Nosebleed. ? A feeling of being sick to your stomach (nausea). ? Throwing up (vomiting). ? Changes in how you see. ? Very bad chest pain. ? Seizures. How is this treated?  This condition is treated by making healthy lifestyle changes, such as: ? Eating healthy foods. ? Exercising more. ? Drinking less alcohol.  Your health care provider may prescribe medicine if lifestyle changes are not enough to get your blood pressure under control, and if: ? Your top number is above 130. ? Your bottom number is above  80.  Your personal target blood pressure may vary. Follow these instructions at home: Eating and drinking   If told, follow the DASH eating plan. To follow this plan: ? Fill one half of your plate at each meal with fruits and vegetables. ? Fill one fourth of your plate at each meal with whole grains. Whole grains include whole-wheat pasta, brown rice, and whole-grain bread. ? Eat or drink low-fat dairy products, such as skim milk or low-fat yogurt. ? Fill one fourth of your plate at each meal with low-fat (lean) proteins. Low-fat proteins include fish, chicken without skin, eggs, beans, and tofu. ? Avoid fatty meat, cured and processed meat, or chicken with skin. ? Avoid pre-made or processed food.  Eat less than 1,500 mg of salt each day.  Do not drink alcohol if: ? Your doctor tells you not to drink. ? You are pregnant, may be pregnant, or are planning to become pregnant.  If you drink alcohol: ? Limit how much you use to:  0-1 drink a day for women.  0-2 drinks a day for men. ? Be aware of how much alcohol is in your drink. In the [place] one drink equals one 12 oz bottle of beer (355 mL), one 5 oz glass of wine (148 mL), or one 1 oz glass of hard liquor (44 mL). Lifestyle   Work with your doctor to stay at a healthy weight or to lose weight. Ask your doctor what the best weight is for you.  Get at least 30 minutes of exercise most   days of the week. This may include walking, swimming, or biking.  Get at least 30 minutes of exercise that strengthens your muscles (resistance exercise) at least 3 days a week. This may include lifting weights or doing Pilates.  Do not use any products that contain nicotine or tobacco, such as cigarettes, e-cigarettes, and chewing tobacco. If you need help quitting, ask your doctor.  Check your blood pressure at home as told by your doctor.  Keep all follow-up visits as told by your doctor. This is important. Medicines  Take over-the-counter  and prescription medicines only as told by your doctor. Follow directions carefully.  Do not skip doses of blood pressure medicine. The medicine does not work as well if you skip doses. Skipping doses also puts you at risk for problems.  Ask your doctor about side effects or reactions to medicines that you should watch for. Contact a doctor if you:  Think you are having a reaction to the medicine you are taking.  Have headaches that keep coming back (recurring).  Feel dizzy.  Have swelling in your ankles.  Have trouble with your vision. Get help right away if you:  Get a very bad headache.  Start to feel mixed up (confused).  Feel weak or numb.  Feel faint.  Have very bad pain in your: ? Chest. ? Belly (abdomen).  Throw up more than once.  Have trouble breathing. Summary  Hypertension is another name for high blood pressure.  High blood pressure forces your heart to work harder to pump blood.  For most people, a normal blood pressure is less than 120/80.  Making healthy choices can help lower blood pressure. If your blood pressure does not get lower with healthy choices, you may need to take medicine. This information is not intended to replace advice given to you by your health care provider. Make sure you discuss any questions you have with your health care provider. Document Revised: 02/19/2018 Document Reviewed: 02/19/2018 Elsevier Patient Education  [place].  Orthostatic Hypotension Blood pressure is a measurement of how strongly, or weakly, your blood is pressing against the walls of your arteries. Orthostatic hypotension is a sudden drop in blood pressure that happens when you quickly change positions, such as when you get up from sitting or lying down. Arteries are blood vessels that carry blood from your heart throughout your body. When blood pressure is too low, you may not get enough blood to your brain or to the rest of your organs. This can  cause weakness, light-headedness, rapid heartbeat, and fainting. This can last for just a few seconds or for up to a few minutes. Orthostatic hypotension is usually not a serious problem. However, if it happens frequently or gets worse, it may be a sign of something more serious. What are the causes? This condition may be caused by:  Sudden changes in posture, such as standing up quickly after you have been sitting or lying down.  Blood loss.  Loss of body fluids (dehydration).  Heart problems.  Hormone (endocrine) problems.  Pregnancy.  Severe infection.  Lack of certain nutrients.  Severe allergic reactions (anaphylaxis).  Certain medicines, such as blood pressure medicine or medicines that make the body lose excess fluids (diuretics). Sometimes, this condition can be caused by not taking medicine as directed, such as taking too much of a certain medicine. What increases the risk? The following factors may make you more likely to develop this condition:  Age. Risk increases as you  get older.  Conditions that affect the heart or the central nervous system.  Taking certain medicines, such as blood pressure medicine or diuretics.  Being pregnant. What are the signs or symptoms? Symptoms of this condition may include:  Weakness.  Light-headedness.  Dizziness.  Blurred vision.  Fatigue.  Rapid heartbeat.  Fainting, in severe cases. How is this diagnosed? This condition is diagnosed based on:  Your medical history.  Your symptoms.  Your blood pressure measurement. Your health care provider will check your blood pressure when you are: ? Lying down. ? Sitting. ? Standing. A blood pressure reading is recorded as two numbers, such as "120 over 80" (or 120/80). The first ("top") number is called the systolic pressure. It is a measure of the pressure in your arteries as your heart beats. The second ("bottom") number is called the diastolic pressure. It is a measure  of the pressure in your arteries when your heart relaxes between beats. Blood pressure is measured in a unit called mm Hg. Healthy blood pressure for most adults is 120/80. If your blood pressure is below 90/60, you may be diagnosed with hypotension. Other information or tests that may be used to diagnose orthostatic hypotension include:  Your other vital signs, such as your heart rate and temperature.  Blood tests.  Tilt table test. For this test, you will be safely secured to a table that moves you from a lying position to an upright position. Your heart rhythm and blood pressure will be monitored during the test. How is this treated? This condition may be treated by:  Changing your diet. This may involve eating more salt (sodium) or drinking more water.  Taking medicines to raise your blood pressure.  Changing the dosage of certain medicines you are taking that might be lowering your blood pressure.  Wearing compression stockings. These stockings help to prevent blood clots and reduce swelling in your legs. In some cases, you may need to go to the hospital for:  Fluid replacement. This means you will receive fluids through an IV.  Blood replacement. This means you will receive donated blood through an IV (transfusion).  Treating an infection or heart problems, if this applies.  Monitoring. You may need to be monitored while medicines that you are taking wear off. Follow these instructions at home: Eating and drinking   Drink enough fluid to keep your urine pale yellow.  Eat a healthy diet, and follow instructions from your health care provider about eating or drinking restrictions. A healthy diet includes: ? Fresh fruits and vegetables. ? Whole grains. ? Lean meats. ? Low-fat dairy products.  Eat extra salt only as directed. Do not add extra salt to your diet unless your health care provider told you to do that.  Eat frequent, small meals.  Avoid standing up suddenly  after eating. Medicines  Take over-the-counter and prescription medicines only as told by your health care provider. ? Follow instructions from your health care provider about changing the dosage of your current medicines, if this applies. ? Do not stop or adjust any of your medicines on your own. General instructions   Wear compression stockings as told by your health care provider.  Get up slowly from lying down or sitting positions. This gives your blood pressure a chance to adjust.  Avoid hot showers and excessive heat as directed by your health care provider.  Return to your normal activities as told by your health care provider. Ask your health care provider what activities  are safe for you.  Do not use any products that contain nicotine or tobacco, such as cigarettes, e-cigarettes, and chewing tobacco. If you need help quitting, ask your health care provider.  Keep all follow-up visits as told by your health care provider. This is important. Contact a health care provider if you:  Vomit.  Have diarrhea.  Have a fever for more than 2-3 days.  Feel more thirsty than usual.  Feel weak and tired. Get help right away if you:  Have chest pain.  Have a fast or irregular heartbeat.  Develop numbness in any part of your body.  Cannot move your arms or your legs.  Have trouble speaking.  Become sweaty or feel light-headed.  Faint.  Feel short of breath.  Have trouble staying awake.  Feel confused. Summary  Orthostatic hypotension is a sudden drop in blood pressure that happens when you quickly change positions.  Orthostatic hypotension is usually not a serious problem.  It is diagnosed by having your blood pressure taken lying down, sitting, and then standing.  It may be treated by changing your diet or adjusting your medicines. This information is not intended to replace advice given to you by your health care provider. Make sure you discuss any questions  you have with your health care provider. Document Revised: 12/05/2017 Document Reviewed: 12/05/2017 Elsevier Patient Education  [place].

## 2019-10-05 NOTE — Progress Notes (Signed)
Established Patient Office Visit  Subjective:  Patient ID: [name], female    DOB: 08-04-1955  Age: 64 y.o. MRN: [id number]  CC: No chief complaint on file.   HPI MarciaJaculin D Anastasia Hale African American female that presents for blood pressure follow up . Previous visit prescribed amlodipine 10 mg and Hyzaar 100/25 mg both daily. Denies shortness of breath, headaches, chest pain or lower extremity edema. She does report dizziness off balance this occurs when she is switching positions. Also, has concerns about her feet has bunions on the anterior side of both feet.   Past Medical History:  Diagnosis Date  . Anemia    hx  . Arthritis    "all over" (06/19/2017)  . Diabetes mellitus without complication ([name])   . Hyperlipidemia   . Hypertension   . TIA (transient ischemic attack)    pt unaware of this hx on 06/19/2017    Past Surgical History:  Procedure Laterality Date  . ANTERIOR CERVICAL DECOMP/DISCECTOMY FUSION    . BACK SURGERY    . CESAREAN SECTION  1986  . IR GENERIC HISTORICAL  09/21/2014   IR ANGIO INTRA EXTRACRAN SEL INTERNAL CAROTID UNI L MOD SED 09/21/2014 [name], MD [facility]  . IR GENERIC HISTORICAL  09/21/2014   IR ANGIO INTRA EXTRACRAN SEL COM CAROTID INNOMINATE UNI R MOD SED 09/21/2014 [name], MD [facility]  . IR GENERIC HISTORICAL  09/21/2014   IR ANGIO VERTEBRAL SEL VERTEBRAL UNI L MOD SED 09/21/2014 [name], MD [facility]  . IR GENERIC HISTORICAL  09/21/2014   IR 3D INDEPENDENT WKST 09/21/2014 [name], MD [facility]  . LAPAROSCOPIC CHOLECYSTECTOMY    . TUBAL LIGATION      Family History  Problem Relation Age of Onset  . Hypertension Mother   . Arthritis Mother   . Diabetes Mother   . Heart disease Father   . Diabetes Sister   . Diabetes Brother   . Diabetes Sister   . Cancer Neg Hx     Social History   Socioeconomic History  . Marital status: Widowed    Spouse name: Not on file  .  Number of children: Not on file  . Years of education: Not on file  . Highest education level: Not on file  Occupational History  . Not on file  Tobacco Use  . Smoking status: Never Smoker  . Smokeless tobacco: Never Used  Substance and Sexual Activity  . Alcohol use: Yes    Comment: socially   . Drug use: No  . Sexual activity: Yes  Other Topics Concern  . 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 Difficulty of Paying Living Expenses:   Food Insecurity:   . Worried About Charity fundraiser in the Last Year:   . Arboriculturist in the Last Year:   Transportation Needs:   . Film/video editor (Medical):   [name] Lack of Transportation (Non-Medical):   Physical Activity:   . Days of Exercise per Week:   . Minutes of Exercise per Session:   Stress:   . Feeling of Stress :   Social Connections:   . Frequency of Communication with Friends and Family:   . Frequency of Social Gatherings with Friends  and Family:   . Attends Religious Services:   . Active Member of Clubs or Organizations:   . Attends Archivist Meetings:   [name] Marital Status:   Intimate Partner Violence:   . Fear of Current or Ex-Partner:   . Emotionally Abused:   [name] Physically Abused:   . Sexually Abused:     Outpatient Medications Prior to Visit  Medication Sig Dispense Refill  . ACCU-CHEK GUIDE test strip USE UP TO TWICE DAILY AS DIRECTED 100 each 5  . amLODipine (NORVASC) 10 MG tablet Take 1 tablet (10 mg total) by mouth daily. 90 tablet 0  . aspirin EC 81 MG tablet Take 1 tablet (81 mg total) by mouth daily. 90 tablet 3  . blood glucose meter kit and supplies KIT Dispense based on patient and insurance preference. Use up to twice daily as directed. (FOR ICD-9 250.00, 250.01). 1 each 0  . feeding supplement,  ENSURE ENLIVE, (ENSURE ENLIVE) LIQD Take 237 mLs by mouth 3 (three) times daily between meals. 237 mL 12  . HYDROcodone-acetaminophen (NORCO) 5-325 MG tablet Take 1 tablet by mouth every 4 (four) hours as needed for moderate pain (Or diarrhea). 20 tablet 0  . ibuprofen (ADVIL) 600 MG tablet Take 1 tablet (600 mg total) by mouth every 6 (six) hours as needed. (Patient taking differently: Take 600 mg by mouth every 6 (six) hours as needed for moderate pain. ) 30 tablet 0  . loperamide (IMODIUM A-D) 2 MG tablet Take 1 tablet (2 mg total) by mouth 4 (four) times daily as needed for diarrhea or loose stools. 15 tablet 0  . losartan-hydrochlorothiazide (HYZAAR) 100-25 MG tablet Take 1 tablet by mouth daily. 90 tablet 3  . meclizine (ANTIVERT) 25 MG tablet Take 1 tablet (25 mg total) by mouth 3 (three) times daily as needed for dizziness. 30 tablet 1  . ondansetron (ZOFRAN ODT) 4 MG disintegrating tablet Take 1 tablet (4 mg total) by mouth every 8 (eight) hours as needed for nausea or vomiting. 12 tablet 0  . PARoxetine (PAXIL) 10 MG tablet Take 1 tablet (10 mg total) by mouth daily. 90 tablet 3   No facility-administered medications prior to visit.    No Known Allergies  ROS Review of Systems  Cardiovascular: Positive for palpitations.  Neurological: Positive for dizziness.  All other systems reviewed and are negative.     Objective:    Physical Exam  Constitutional: She appears well-developed and well-nourished.  HENT:  Head: Normocephalic.  Cardiovascular: Normal rate and regular rhythm.  Pulmonary/Chest: Effort normal and breath sounds normal.  Abdominal: Soft. Bowel sounds are normal.  Musculoskeletal:        General: Normal range of motion.     Cervical back: Normal range of motion.  Skin: Skin is warm and dry.  Psychiatric: She has a normal mood and affect. Her behavior is normal. Judgment and thought content normal.    BP (!) 173/96 (BP Location: Left Arm, Patient Position:  Sitting, Cuff Size: Small)   Pulse 71   Temp (!) 97.2 F (36.2 C) (Temporal)   Ht '5\' 2"'  (1.575 m)   Wt 110 lb 12.8 oz (50.3 kg)   SpO2 98%   BMI 20.27 kg/m  Wt Readings from Last 3 Encounters:  10/05/19 110 lb 12.8 oz (50.3 kg)  09/07/19 113 lb (51.3 kg)  08/26/19 115 lb (52.2 kg)     Health Maintenance Due  Topic Date Due  . MAMMOGRAM  02/08/2018  . PAP  SMEAR-Modifier  02/01/2019  . PNA vac Low Risk Adult (1 of 2 - PCV13) 09/20/2019    There are no preventive care reminders to display for this patient.  Lab Results  Component Value Date   TSH 1.579 05/17/2018   Lab Results  Component Value Date   WBC 11.5 (H) 08/29/2019   HGB 13.4 08/29/2019   HCT 41.4 08/29/2019   MCV 86.4 08/29/2019   PLT 324 08/29/2019   Lab Results  Component Value Date   NA 140 08/29/2019   K 3.8 08/29/2019   CO2 28 08/29/2019   GLUCOSE 117 (H) 08/29/2019   BUN 13 08/29/2019   CREATININE 0.70 08/29/2019   BILITOT 0.6 08/29/2019   ALKPHOS 76 08/29/2019   AST 16 08/29/2019   ALT 15 08/29/2019   PROT 7.4 08/29/2019   ALBUMIN 4.2 08/29/2019   CALCIUM 10.0 08/29/2019   ANIONGAP 9 08/29/2019   Lab Results  Component Value Date   CHOL 238 (H) 10/05/2019   Lab Results  Component Value Date   HDL 71 10/05/2019   Lab Results  Component Value Date   LDLCALC 154 (H) 10/05/2019   Lab Results  Component Value Date   TRIG 74 10/05/2019   Lab Results  Component Value Date   CHOLHDL 3.4 10/05/2019   Lab Results  Component Value Date   HGBA1C 5.8 (A) 09/07/2019      Assessment & Plan:   Diagnoses and all orders for this visit:  Breast cancer screening by mammogram -     MM Digital Screening; Future  Colon cancer screening -     Ambulatory referral to Gastroenterology  Post menopausal syndrome -     HM DEXA SCAN  Mixed hyperlipidemia lipids to be drawn today to determine medication dosage. Your LDL is not in range.  Decrease your fatty foods, red meat, cheese, milk and  increase fiber like whole grains and veggies.  Lipids  Essential hypertension Patient is able to take blood pressure at home and readings range from  systolic [id number] and systolic endorse compliance to medication . Remains uncontrol will add coreg 3.125 twice daily  Blood pressure goal of less than 130/80, low-sodium, DASH diet, medication compliance, 150 minutes of moderate intensity exercise per week. Discussed medication compliance, adverse effects.  Bilateral bunions Refer to podiatry    Meds ordered this encounter  Medications  . carvedilol (COREG) 3.125 MG tablet    Sig: Take 1 tablet (3.125 mg total) by mouth 2 (two) times daily with a meal.    Dispense:  60 tablet    Refill:  3    Follow-up: Return in about 3 weeks (around 10/26/2019) for Bp follow up tele.    [name], NP

## 2019-10-06 LAB — LIPID PANEL
Chol/HDL Ratio: 3.4 ratio (ref 0.0–4.4)
Cholesterol, Total: 238 mg/dL — ABNORMAL HIGH (ref 100–199)
HDL: 71 mg/dL (ref >39)
LDL Chol Calc (NIH): 154 mg/dL — ABNORMAL HIGH (ref 0–99)
Triglycerides: 74 mg/dL (ref 0–149)
VLDL Cholesterol Cal: 13 mg/dL (ref 5–40)

## 2019-10-07 ENCOUNTER — Encounter: Payer: Self-pay | Admitting: Gastroenterology

## 2019-10-08 ENCOUNTER — Other Ambulatory Visit (INDEPENDENT_AMBULATORY_CARE_PROVIDER_SITE_OTHER): Payer: Self-pay | Admitting: Primary Care

## 2019-10-08 ENCOUNTER — Other Ambulatory Visit: Payer: Self-pay | Admitting: Primary Care

## 2019-10-08 DIAGNOSIS — N951 Menopausal and female climacteric states: Secondary | ICD-10-CM

## 2019-10-08 DIAGNOSIS — Z1231 Encounter for screening mammogram for malignant neoplasm of breast: Secondary | ICD-10-CM

## 2019-10-08 MED ORDER — ATORVASTATIN CALCIUM 20 MG PO TABS: 20.0000 mg | ORAL_TABLET | Freq: Every day | ORAL | 3 refills | Status: DC

## 2019-10-22 ENCOUNTER — Ambulatory Visit (AMBULATORY_SURGERY_CENTER): Payer: Self-pay | Admitting: *Deleted

## 2019-10-22 ENCOUNTER — Other Ambulatory Visit: Payer: Self-pay

## 2019-10-22 VITALS — Temp 97.5°F | Ht 62.0 in | Wt 112.0 lb

## 2019-10-22 DIAGNOSIS — Z01818 Encounter for other preprocedural examination: Secondary | ICD-10-CM

## 2019-10-22 DIAGNOSIS — Z1211 Encounter for screening for malignant neoplasm of colon: Secondary | ICD-10-CM

## 2019-10-22 MED ORDER — SUTAB 1479-225-188 MG PO TABS: 1.0000 | ORAL_TABLET | ORAL | 0 refills | Status: DC

## 2019-10-22 NOTE — Progress Notes (Signed)
Patient is here in-person for PV. Patient denies any allergies to eggs or soy. Patient denies any problems with anesthesia/sedation. Patient denies any oxygen use at home. Patient denies taking any diet/weight loss medications or blood thinners. Patient is not being treated for MRSA or C-diff. Patient is aware of our care-partner policy and 0000000 safety protocol. EMMI education assisgned to the patient for the procedure, this was explained and instructions given to patient.   COVID-19 screening test is on 5/10, the pt is aware.   sutab sample given to pt.

## 2019-10-26 ENCOUNTER — Ambulatory Visit (INDEPENDENT_AMBULATORY_CARE_PROVIDER_SITE_OTHER): Payer: Medicare Other | Admitting: Primary Care

## 2019-10-29 ENCOUNTER — Ambulatory Visit (INDEPENDENT_AMBULATORY_CARE_PROVIDER_SITE_OTHER): Payer: Medicare Other

## 2019-10-29 ENCOUNTER — Other Ambulatory Visit: Payer: Self-pay

## 2019-10-29 ENCOUNTER — Ambulatory Visit (INDEPENDENT_AMBULATORY_CARE_PROVIDER_SITE_OTHER): Payer: Medicare Other | Admitting: Podiatry

## 2019-10-29 DIAGNOSIS — M2041 Other hammer toe(s) (acquired), right foot: Secondary | ICD-10-CM

## 2019-10-29 DIAGNOSIS — M79672 Pain in left foot: Secondary | ICD-10-CM

## 2019-10-29 DIAGNOSIS — E782 Mixed hyperlipidemia: Secondary | ICD-10-CM

## 2019-10-29 DIAGNOSIS — M2042 Other hammer toe(s) (acquired), left foot: Secondary | ICD-10-CM

## 2019-10-29 DIAGNOSIS — M79671 Pain in right foot: Secondary | ICD-10-CM

## 2019-10-29 DIAGNOSIS — M2011 Hallux valgus (acquired), right foot: Secondary | ICD-10-CM

## 2019-10-29 DIAGNOSIS — I10 Essential (primary) hypertension: Secondary | ICD-10-CM

## 2019-10-29 DIAGNOSIS — M21611 Bunion of right foot: Secondary | ICD-10-CM

## 2019-10-29 DIAGNOSIS — M21619 Bunion of unspecified foot: Secondary | ICD-10-CM

## 2019-10-29 DIAGNOSIS — M21612 Bunion of left foot: Secondary | ICD-10-CM

## 2019-10-29 DIAGNOSIS — M2012 Hallux valgus (acquired), left foot: Secondary | ICD-10-CM

## 2019-10-29 NOTE — Patient Instructions (Signed)
Pre-Operative Instructions  Congratulations, you have decided to take an important step towards improving your quality of life.  You can be assured that the doctors and staff at [facility] will be with you every step of the way.  Here are some important things you should know:  1. Plan to be at the surgery center/hospital at least 1 (one) hour prior to your scheduled time, unless otherwise directed by the surgical center/hospital staff.  You must have a responsible adult accompany you, remain during the surgery and drive you home.  Make sure you have directions to the surgical center/hospital to ensure you arrive on time. 2. If you are having surgery at [facility] or  hospitals, you will need a copy of your medical history and physical form from your family physician within one month prior to the date of surgery. We will give you a form for your primary physician to complete.  3. We make every effort to accommodate the date you request for surgery.  However, there are times where surgery dates or times have to be moved.  We will contact you as soon as possible if a change in schedule is required.   4. No aspirin/ibuprofen for one week before surgery.  If you are on aspirin, any non-steroidal anti-inflammatory medications (Mobic, Aleve, Ibuprofen) should not be taken seven (7) days prior to your surgery.  You make take Tylenol for pain prior to surgery.  5. Medications - If you are taking daily heart and blood pressure medications, seizure, reflux, allergy, asthma, anxiety, pain or diabetes medications, make sure you notify the surgery center/hospital before the day of surgery so they can tell you which medications you should take or avoid the day of surgery. 6. No food or drink after midnight the night before surgery unless directed otherwise by surgical center/hospital staff. 7. No alcoholic beverages 24-hours prior to surgery.  No smoking 24-hours prior or 24-hours after  surgery. 8. Wear loose pants or shorts. They should be loose enough to fit over bandages, boots, and casts. 9. Don't wear slip-on shoes. Sneakers are preferred. 10. Bring your boot with you to the surgery center/hospital.  Also bring crutches or a walker if your physician has prescribed it for you.  If you do not have this equipment, it will be provided for you after surgery. 11. If you have not been contacted by the surgery center/hospital by the day before your surgery, call to confirm the date and time of your surgery. 12. Leave-time from work may vary depending on the type of surgery you have.  Appropriate arrangements should be made prior to surgery with your employer. 13. Prescriptions will be provided immediately following surgery by your doctor.  Fill these as soon as possible after surgery and take the medication as directed. Pain medications will not be refilled on weekends and must be approved by the doctor. 14. Remove nail polish on the operative foot and avoid getting pedicures prior to surgery. 15. Wash the night before surgery.  The night before surgery wash the foot and leg well with water and the antibacterial soap provided. Be sure to pay special attention to beneath the toenails and in between the toes.  Wash for at least three (3) minutes. Rinse thoroughly with water and dry well with a towel.  Perform this wash unless told not to do so by your physician.  Enclosed: 1 Ice pack (please put in freezer the night before surgery)   1 Hibiclens skin cleaner     Pre-op instructions  If you have any questions regarding the instructions, please do not hesitate to call our office.  [place]: [address], [place], [place] [address] -- [phone]  [place]: [address] [place], [place] [address] -- [phone]  [place]: [address], [place], [place] [address] -- [phone]   Website: [email or web address] 

## 2019-10-30 ENCOUNTER — Other Ambulatory Visit: Payer: Self-pay | Admitting: Podiatry

## 2019-10-30 DIAGNOSIS — M21619 Bunion of unspecified foot: Secondary | ICD-10-CM

## 2019-11-02 ENCOUNTER — Ambulatory Visit (INDEPENDENT_AMBULATORY_CARE_PROVIDER_SITE_OTHER): Payer: Medicare Other

## 2019-11-02 ENCOUNTER — Other Ambulatory Visit: Payer: Self-pay | Admitting: Gastroenterology

## 2019-11-02 DIAGNOSIS — Z1159 Encounter for screening for other viral diseases: Secondary | ICD-10-CM

## 2019-11-02 LAB — SARS CORONAVIRUS 2 (TAT 6-24 HRS): SARS Coronavirus 2: NEGATIVE

## 2019-11-03 ENCOUNTER — Encounter: Payer: Self-pay | Admitting: Gastroenterology

## 2019-11-04 ENCOUNTER — Ambulatory Visit (INDEPENDENT_AMBULATORY_CARE_PROVIDER_SITE_OTHER): Payer: Medicare Other | Admitting: Primary Care

## 2019-11-05 ENCOUNTER — Ambulatory Visit (AMBULATORY_SURGERY_CENTER): Payer: Medicare Other | Admitting: Gastroenterology

## 2019-11-05 ENCOUNTER — Other Ambulatory Visit: Payer: Self-pay

## 2019-11-05 ENCOUNTER — Encounter: Payer: Self-pay | Admitting: Gastroenterology

## 2019-11-05 VITALS — BP 187/108 | HR 76 | Temp 96.9°F | Resp 20 | Ht 62.0 in | Wt 112.0 lb

## 2019-11-05 DIAGNOSIS — D123 Benign neoplasm of transverse colon: Secondary | ICD-10-CM

## 2019-11-05 DIAGNOSIS — D128 Benign neoplasm of rectum: Secondary | ICD-10-CM

## 2019-11-05 DIAGNOSIS — Z1211 Encounter for screening for malignant neoplasm of colon: Secondary | ICD-10-CM

## 2019-11-05 DIAGNOSIS — D125 Benign neoplasm of sigmoid colon: Secondary | ICD-10-CM | POA: Diagnosis not present

## 2019-11-05 DIAGNOSIS — D127 Benign neoplasm of rectosigmoid junction: Secondary | ICD-10-CM

## 2019-11-05 MED ORDER — SODIUM CHLORIDE 0.9 % IV SOLN: 500.0000 mL | Freq: Once | INTRAVENOUS | Status: DC

## 2019-11-05 NOTE — Progress Notes (Signed)
Report to PACU, RN, vss, BBS= Clear.  

## 2019-11-05 NOTE — Patient Instructions (Signed)
Discharge instructions given. Handouts on polyps and diverticulosis. Resume previous medications. No aspirin,ibuprofen,naproxen, or other non-steroidal anti-inflammatory drugs for 2 weeks. YOU HAD AN ENDOSCOPIC PROCEDURE TODAY AT [facility]:   Refer to the procedure report that was given to you for any specific questions about what was found during the examination.  If the procedure report does not answer your questions, please call your gastroenterologist to clarify.  If you requested that your care partner not be given the details of your procedure findings, then the procedure report has been included in a sealed envelope for you to review at your convenience later.  YOU SHOULD EXPECT: Some feelings of bloating in the abdomen. Passage of more gas than usual.  Walking can help get rid of the air that was put into your GI tract during the procedure and reduce the bloating. If you had a lower endoscopy (such as a colonoscopy or flexible sigmoidoscopy) you may notice spotting of blood in your stool or on the toilet paper. If you underwent a bowel prep for your procedure, you may not have a normal bowel movement for a few days.  Please Note:  You might notice some irritation and congestion in your nose or some drainage.  This is from the oxygen used during your procedure.  There is no need for concern and it should clear up in a day or so.  SYMPTOMS TO REPORT IMMEDIATELY:   Following lower endoscopy (colonoscopy or flexible sigmoidoscopy):  Excessive amounts of blood in the stool  Significant tenderness or worsening of abdominal pains  Swelling of the abdomen that is new, acute  Fever of 100F or higher   For urgent or emergent issues, a gastroenterologist can be reached at any hour by calling [phone]. Do not use MyChart messaging for urgent concerns.    DIET:  We do recommend a small meal at first, but then you may proceed to your regular diet.  Drink plenty of fluids  but you should avoid alcoholic beverages for 24 hours.  ACTIVITY:  You should plan to take it easy for the rest of today and you should NOT DRIVE or use heavy machinery until tomorrow (because of the sedation medicines used during the test).    FOLLOW UP: Our staff will call the number listed on your records 48-72 hours following your procedure to check on you and address any questions or concerns that you may have regarding the information given to you following your procedure. If we do not reach you, we will leave a message.  We will attempt to reach you two times.  During this call, we will ask if you have developed any symptoms of COVID 19. If you develop any symptoms (ie: fever, flu-like symptoms, shortness of breath, cough etc.) before then, please call [phone].  If you test positive for Covid 19 in the 2 weeks post procedure, please call and report this information to [name].    If any biopsies were taken you will be contacted by phone or by letter within the next 1-3 weeks.  Please call us at [phone] if you have not heard about the biopsies in 3 weeks.    SIGNATURES/CONFIDENTIALITY: You and/or your care partner have signed paperwork which will be entered into your electronic medical record.  These signatures attest to the fact that that the information above on your After Visit Summary has been reviewed and is understood.  Full responsibility of the confidentiality of this discharge information lies with you  and/or your care-partner. 

## 2019-11-05 NOTE — Progress Notes (Signed)
Pt's states no medical or surgical changes since previsit or office visit. 

## 2019-11-05 NOTE — Progress Notes (Signed)
Called to room to assist during endoscopic procedure.  Patient ID and intended procedure confirmed with present staff. Received instructions for my participation in the procedure from the performing physician.  

## 2019-11-09 ENCOUNTER — Telehealth: Payer: Self-pay | Admitting: *Deleted

## 2019-11-09 ENCOUNTER — Telehealth: Payer: Self-pay

## 2019-11-09 NOTE — Telephone Encounter (Signed)
Left message on follow up call. 

## 2019-11-09 NOTE — Telephone Encounter (Signed)
  Follow up Call-  Call back number 11/05/2019  Post procedure Call Back phone  # [phone]  Permission to leave phone message Yes  Some recent data might be hidden     Patient questions:  Do you have a fever, pain , or abdominal swelling? No. Pain Score  0 *  Have you tolerated food without any problems? Yes.    Have you been able to return to your normal activities? Yes.    Do you have any questions about your discharge instructions: Diet   No. Medications  No. Follow up visit  No.  Do you have questions or concerns about your Care? No.  Actions: * If pain score is 4 or above: No action needed, pain <4.   1. Have you developed a fever since your procedure? no  2.   Have you had an respiratory symptoms (SOB or cough) since your procedure? no  3.   Have you tested positive for COVID 19 since your procedure no  4.   Have you had any family members/close contacts diagnosed with the COVID 19 since your procedure?  no   If yes to any of these questions please route to Joylene John, RN and Erenest Rasher, RN

## 2019-11-09 NOTE — Telephone Encounter (Signed)
Pt called stated she is feeling fine.

## 2019-11-13 ENCOUNTER — Other Ambulatory Visit: Payer: Self-pay

## 2019-11-13 ENCOUNTER — Ambulatory Visit
Admission: RE | Admit: 2019-11-13 | Discharge: 2019-11-13 | Disposition: A | Discharge status: Home / Self Care | Payer: Medicare Other | Source: Ambulatory Visit | Attending: Primary Care | Admitting: Primary Care

## 2019-11-13 DIAGNOSIS — Z1231 Encounter for screening mammogram for malignant neoplasm of breast: Secondary | ICD-10-CM

## 2019-11-16 ENCOUNTER — Telehealth: Payer: Self-pay | Admitting: Gastroenterology

## 2019-11-17 ENCOUNTER — Other Ambulatory Visit: Payer: Self-pay | Admitting: Primary Care

## 2019-11-17 DIAGNOSIS — N6489 Other specified disorders of breast: Secondary | ICD-10-CM

## 2019-11-17 NOTE — Telephone Encounter (Signed)
Left message for patient to call back  

## 2019-11-18 NOTE — Telephone Encounter (Signed)
Left message for patient to call back  

## 2019-11-19 ENCOUNTER — Encounter: Payer: Self-pay | Admitting: Gastroenterology

## 2019-11-20 NOTE — Telephone Encounter (Signed)
No return calls from the patient 

## 2019-11-24 HISTORY — PX: FOOT SURGERY: SHX648

## 2019-11-25 ENCOUNTER — Telehealth (INDEPENDENT_AMBULATORY_CARE_PROVIDER_SITE_OTHER): Payer: Medicare Other | Admitting: Primary Care

## 2019-11-30 ENCOUNTER — Telehealth (INDEPENDENT_AMBULATORY_CARE_PROVIDER_SITE_OTHER): Payer: Self-pay

## 2019-11-30 NOTE — Telephone Encounter (Signed)
Patient called to request results of DEXA scan. Please look at results and advise. Nat Christen, CMA

## 2019-12-03 NOTE — Progress Notes (Signed)
  Subjective:  Patient ID: [name], female    DOB: [birth year],  MRN: [id number]  Chief Complaint  Patient presents with  . Bunions    Pt states bilateral painful bunions which are causing her toes to crowd and hurt, 2 year duration.  . Nail Problem    Bilateral 1-5 nail discoloration and thickness, nail trim.  . Foot Problem    Bilateral dark discoloration/spots plantar feet, 2 years duration. Pt states no itching or pain.    64 y.o. female presents with the above complaint. History confirmed with patient.   Objective:  Physical Exam: warm, good capillary refill, no trophic changes or ulcerative lesions, normal DP and PT pulses and normal sensory exam. Left Foot: HAV deformity left with pain to palpation. Hammertoes all digits left foot.  Right Foot: HAV deformity left with pain to palpation. Hammertoes all digits  No images are attached to the encounter.  Radiographs: X-ray of the left foot: hallux valgus deformity and digital contractures. 1st IMA 16 degrees. Assessment:   1. Hallux valgus, left   2. Hallux valgus, right   3. Hammer toes of both feet   4. Mixed hyperlipidemia   5. Essential hypertension    Plan:  Patient was evaluated and treated and all questions answered.   Bunion and Hammertoe -XR reviewed with patient -Educated on etiology of deformity -Discussed proper shoe gear modifications and padding  -Patient has failed all conservative therapy and wishes to proceed with surgical intervention. All risks, benefits, and alternatives discussed with patient. No guarantees given. Consent reviewed and signed by patient. -Planned procedures: [place] bunionectomy left, hammertoe corrections 2,3,4,5 left  -Risk factors: HTN, HLD  No follow-ups on file.

## 2019-12-04 ENCOUNTER — Other Ambulatory Visit (INDEPENDENT_AMBULATORY_CARE_PROVIDER_SITE_OTHER): Payer: Self-pay | Admitting: Primary Care

## 2019-12-04 ENCOUNTER — Ambulatory Visit: Payer: Medicare Other

## 2019-12-04 ENCOUNTER — Other Ambulatory Visit: Payer: Self-pay

## 2019-12-04 ENCOUNTER — Telehealth: Payer: Self-pay

## 2019-12-04 ENCOUNTER — Ambulatory Visit
Admission: RE | Admit: 2019-12-04 | Discharge: 2019-12-04 | Disposition: A | Discharge status: Home / Self Care | Payer: Medicare Other | Source: Ambulatory Visit | Attending: Primary Care | Admitting: Primary Care

## 2019-12-04 DIAGNOSIS — N6489 Other specified disorders of breast: Secondary | ICD-10-CM

## 2019-12-04 NOTE — Telephone Encounter (Signed)
DOS 12/09/2019  [place] BUNIONECTOMY LT - 28296 HAMMERTOE REPAIR 2-5 LT - 24497  [employer] EFFECTIVE DATE - 08/24/2019  PLAN DEDUCTIBLE - $0.00 OUT OF POCKET - $4500 W/$4040.00 REMAINING  CO-INSURANCE 0% / Day OUTPATIENT SURGERY 0% / [place] $325 / Day OUTPATIENT SURGERY $325 / [place]  Tracking #: [id number]  Message: [phone]: Procedures performed at an ambulatory surgery center do not require prior authorization. This is confirmation that your request for this service to be performed at an outpatient hospital site of care has been canceled.  28285: Procedures performed at an ambulatory surgery center do not require prior authorization. This is confirmation that your request for this service to be performed at an outpatient hospital site of care has been canceled.

## 2019-12-09 ENCOUNTER — Encounter: Payer: Self-pay | Admitting: Podiatry

## 2019-12-09 ENCOUNTER — Other Ambulatory Visit: Payer: Self-pay | Admitting: Podiatry

## 2019-12-09 ENCOUNTER — Telehealth: Payer: Self-pay | Admitting: Podiatry

## 2019-12-09 DIAGNOSIS — M2012 Hallux valgus (acquired), left foot: Secondary | ICD-10-CM

## 2019-12-09 DIAGNOSIS — M2042 Other hammer toe(s) (acquired), left foot: Secondary | ICD-10-CM | POA: Diagnosis not present

## 2019-12-09 MED ORDER — ONDANSETRON HCL 4 MG PO TABS: 4.0000 mg | ORAL_TABLET | Freq: Three times a day (TID) | ORAL | 0 refills | Status: DC | PRN

## 2019-12-09 MED ORDER — CEPHALEXIN 500 MG PO CAPS
500.0000 mg | ORAL_CAPSULE | Freq: Two times a day (BID) | ORAL | 0 refills | Status: DC
Start: 2019-12-09 — End: 2020-03-09

## 2019-12-09 MED ORDER — OXYCODONE-ACETAMINOPHEN 10-325 MG PO TABS: 1.0000 | ORAL_TABLET | ORAL | 0 refills | Status: DC | PRN

## 2019-12-09 NOTE — Telephone Encounter (Signed)
[facility] called about pt medication please call [facility] [phone] DOS 12/09/19

## 2019-12-10 ENCOUNTER — Other Ambulatory Visit: Payer: Self-pay

## 2019-12-10 ENCOUNTER — Ambulatory Visit (INDEPENDENT_AMBULATORY_CARE_PROVIDER_SITE_OTHER): Payer: Medicare Other | Admitting: Podiatry

## 2019-12-10 VITALS — BP 186/103 | HR 80 | Temp 98.7°F

## 2019-12-10 DIAGNOSIS — M2012 Hallux valgus (acquired), left foot: Secondary | ICD-10-CM

## 2019-12-10 DIAGNOSIS — M2042 Other hammer toe(s) (acquired), left foot: Secondary | ICD-10-CM

## 2019-12-10 DIAGNOSIS — M2041 Other hammer toe(s) (acquired), right foot: Secondary | ICD-10-CM

## 2019-12-10 MED ORDER — PROMETHAZINE HCL 25 MG PO TABS
25.0000 mg | ORAL_TABLET | Freq: Three times a day (TID) | ORAL | 0 refills | Status: DC | PRN
Start: 2019-12-10 — End: 2020-03-09

## 2019-12-10 NOTE — Progress Notes (Signed)
  Subjective:  Patient ID: [name], female    DOB: 05-02-1956,  MRN: [id number]  Chief Complaint  Patient presents with  . Post-op Problem    DOS 6.16.2021 Pt states active bleeding from surgical site. Denies fever but states she has nausea, vomiting and chills. Pt states vomited 3 times today and cannot keep solids or liquids down.     DOS: 12/09/19 Procedure: [place] bunionectomy left, hammertoe corrections 2,3,4,5 left   64 y.o. female presents with the above complaint. History confirmed with patient.   Objective:  Physical Exam: tenderness at the surgical site, local edema noted and calf supple, nontender. Incision: healing well, no dehiscence, no significant erythema  Assessment:   1. Hallux valgus, left   2. Hammer toes of both feet     Plan:  Patient was evaluated and treated and all questions answered.  Post-operative State -Dressing partially replaced, deepest layers left intact -Switch to phenergan for N/V  -XR next visit  No follow-ups on file.

## 2019-12-15 ENCOUNTER — Telehealth (INDEPENDENT_AMBULATORY_CARE_PROVIDER_SITE_OTHER): Payer: Self-pay

## 2019-12-15 NOTE — Telephone Encounter (Signed)
Patient called stating she recently had surgery on her left foot on Wednesday the 16 and has a boot on her foot but states it causes her more pain. Patient also states she has loss of appetite and wanted to know if PCP could help or prescribe anything for her appetite and the pain on her foot. Patient states she if forcing herself to drink chicken broth and crackers but gets nauseated. Patient also wants to know if PCP can prescribe vitamin D.   Please advice [phone]   [facility] uses  [facility] [address]

## 2019-12-16 NOTE — Telephone Encounter (Signed)
Patient returned call. She spoke with the surgeon about the boot causing her more pain. She has been advised that before vitamin D can be prescribed she would need labs. Please address concerns of loss of appetite.

## 2019-12-16 NOTE — Telephone Encounter (Signed)
Left message asking patient to return call to [facility] at [phone].  

## 2019-12-18 ENCOUNTER — Other Ambulatory Visit: Payer: Self-pay

## 2019-12-18 ENCOUNTER — Ambulatory Visit (INDEPENDENT_AMBULATORY_CARE_PROVIDER_SITE_OTHER): Payer: Medicare Other

## 2019-12-18 ENCOUNTER — Ambulatory Visit (INDEPENDENT_AMBULATORY_CARE_PROVIDER_SITE_OTHER): Payer: Medicare Other | Admitting: Podiatry

## 2019-12-18 DIAGNOSIS — M2012 Hallux valgus (acquired), left foot: Secondary | ICD-10-CM | POA: Diagnosis not present

## 2019-12-18 DIAGNOSIS — M2042 Other hammer toe(s) (acquired), left foot: Secondary | ICD-10-CM

## 2019-12-18 DIAGNOSIS — M2041 Other hammer toe(s) (acquired), right foot: Secondary | ICD-10-CM

## 2019-12-18 DIAGNOSIS — Z9889 Other specified postprocedural states: Secondary | ICD-10-CM

## 2019-12-18 MED ORDER — OXYCODONE-ACETAMINOPHEN 10-325 MG PO TABS: 1.0000 | ORAL_TABLET | Freq: Four times a day (QID) | ORAL | 0 refills | Status: AC | PRN

## 2019-12-20 ENCOUNTER — Encounter: Payer: Self-pay | Admitting: Podiatry

## 2019-12-20 NOTE — Progress Notes (Signed)
Subjective:  Patient ID: [name], female    DOB: Mar 19, 1956,  MRN: [id number]  Chief Complaint  Patient presents with  . Routine Post Op    POV #2 DOS 12/02/19 LT 5TH TOE PROXIMAL PHALANGEAL BASE RESECTION, RT HEEL BONE BIOPSY/DR PRICE PT     64 y.o. female returns for post-op check. Pain meds.Patient states she is doing well.  She has been keeping the bandages intact.  She denies any other signs of infection.  She denies any acute complaints.  Pain is doing well on pain medication.  Review of Systems: Negative except as noted in the HPI. Denies N/V/F/Ch.  Past Medical History:  Diagnosis Date  . Anemia    hx  . Arthritis    "all over" (06/19/2017)  . Diabetes mellitus without complication (HCC)    diet control  . Hyperlipidemia   . Hypertension   . TIA (transient ischemic attack)    pt unaware of this hx on 06/19/2017    Current Outpatient Medications:  .  aspirin EC 81 MG tablet, Take 1 tablet (81 mg total) by mouth daily., Disp: 90 tablet, Rfl: 3 .  cephALEXin (KEFLEX) 500 MG capsule, Take 1 capsule (500 mg total) by mouth 2 (two) times daily., Disp: 14 capsule, Rfl: 0 .  feeding supplement, ENSURE ENLIVE, (ENSURE ENLIVE) LIQD, Take 237 mLs by mouth 3 (three) times daily between meals., Disp: 237 mL, Rfl: 12 .  losartan-hydrochlorothiazide (HYZAAR) 100-25 MG tablet, Take 1 tablet by mouth daily., Disp: 90 tablet, Rfl: 3 .  meclizine (ANTIVERT) 25 MG tablet, Take 1 tablet (25 mg total) by mouth 3 (three) times daily as needed for dizziness., Disp: 30 tablet, Rfl: 1 .  ondansetron (ZOFRAN ODT) 4 MG disintegrating tablet, Take 1 tablet (4 mg total) by mouth every 8 (eight) hours as needed for nausea or vomiting., Disp: 12 tablet, Rfl: 0 .  ondansetron (ZOFRAN) 4 MG tablet, Take 1 tablet (4 mg total) by mouth every 8 (eight) hours as needed for nausea or vomiting., Disp: 20 tablet, Rfl: 0 .  oxyCODONE-acetaminophen (PERCOCET) 10-325 MG tablet, Take 1 tablet by mouth every  4 (four) hours as needed for pain., Disp: 20 tablet, Rfl: 0 .  promethazine (PHENERGAN) 25 MG tablet, Take 1 tablet (25 mg total) by mouth every 8 (eight) hours as needed for nausea or vomiting., Disp: 20 tablet, Rfl: 0 .  Sodium Sulfate-Mag Sulfate-KCl (SUTAB) [phone] MG TABS, Take 1 kit by mouth as directed. Lot [id number], exp. 3/22, Disp: 24 tablet, Rfl: 0 .  oxyCODONE-acetaminophen (PERCOCET) 10-325 MG tablet, Take 1 tablet by mouth every 6 (six) hours as needed for up to 8 days for pain., Disp: 30 tablet, Rfl: 0  Current Facility-Administered Medications:  .  0.9 %  sodium chloride infusion, 500 mL, Intravenous, Once, Ladene Artist, MD  Social History   Tobacco Use  Smoking Status Never Smoker  Smokeless Tobacco Never Used    No Known Allergies Objective:  There were no vitals filed for this visit. There is no height or weight on file to calculate BMI. Constitutional Well developed. Well nourished.  Vascular Foot warm and well perfused. Capillary refill normal to all digits.   Neurologic Normal speech. Oriented to person, place, and time. Epicritic sensation to light touch grossly present bilaterally.  Dermatologic Skin healing well without signs of infection. Skin edges well coapted without signs of infection.  Orthopedic: Tenderness to palpation noted about the surgical site.   Radiographs: 3 views of skeletally  mature adult left foot: Good correction and alignment noted.  Pins are intact.  No signs of breaking or loosening noted.  Good correction and alignment. Assessment:   1. Hallux valgus, left   2. Hammer toes of both feet   3. Status post foot surgery    Plan:  Patient was evaluated and treated and all questions answered.  S/p foot surgery left -Progressing as expected post-operatively. -XR: See above -WB Status: Weightbearing as tolerated in cam boot -Sutures: Intact.  No signs of dehiscence or clinical signs of infection noted. -Medications:  Percocet -Foot redressed.  No follow-ups on file.

## 2019-12-22 ENCOUNTER — Encounter (INDEPENDENT_AMBULATORY_CARE_PROVIDER_SITE_OTHER): Payer: Self-pay | Admitting: Primary Care

## 2019-12-23 NOTE — Telephone Encounter (Signed)
Please schedule appointment for patient to discuss loss of appetite

## 2019-12-25 ENCOUNTER — Ambulatory Visit (INDEPENDENT_AMBULATORY_CARE_PROVIDER_SITE_OTHER): Payer: Medicare Other

## 2019-12-25 ENCOUNTER — Other Ambulatory Visit: Payer: Self-pay

## 2019-12-25 ENCOUNTER — Ambulatory Visit (INDEPENDENT_AMBULATORY_CARE_PROVIDER_SITE_OTHER): Payer: Medicare Other | Admitting: Podiatry

## 2019-12-25 DIAGNOSIS — M2012 Hallux valgus (acquired), left foot: Secondary | ICD-10-CM

## 2019-12-25 DIAGNOSIS — M2042 Other hammer toe(s) (acquired), left foot: Secondary | ICD-10-CM

## 2019-12-25 DIAGNOSIS — M2041 Other hammer toe(s) (acquired), right foot: Secondary | ICD-10-CM

## 2019-12-25 NOTE — Progress Notes (Signed)
  Subjective:  Patient ID: [name], female    DOB: 04-Jul-1955,  MRN: [id number]  Chief Complaint  Patient presents with  . Routine Post Op    POV # 3 DOS 12/02/19    DOS: 12/09/19 Procedure: [place] bunionectomy left, hammertoe corrections 2,3,4,5 left   64 y.o. female presents with the above complaint. History confirmed with patient.   Objective:  Physical Exam: tenderness at the surgical site, local edema noted and calf supple, nontender. Incision: healing well, no dehiscence, no significant erythema  Assessment:   1. Hammer toes of both feet   2. Hallux valgus, left     Plan:  Patient was evaluated and treated and all questions answered.  Post-operative State -XR taken and reviewed.  Good positioning. -Dressing reapplied consisting of Telfa 4 x 4 Kerlix Ace bandage -Repeat x-rays in 2 weeks consider possible pin removal  No follow-ups on file.

## 2020-01-08 ENCOUNTER — Encounter: Payer: Medicare Other | Admitting: Podiatry

## 2020-01-08 ENCOUNTER — Ambulatory Visit (INDEPENDENT_AMBULATORY_CARE_PROVIDER_SITE_OTHER): Payer: Medicare Other

## 2020-01-08 ENCOUNTER — Other Ambulatory Visit: Payer: Self-pay

## 2020-01-08 ENCOUNTER — Ambulatory Visit (INDEPENDENT_AMBULATORY_CARE_PROVIDER_SITE_OTHER): Payer: Medicare Other | Admitting: Podiatry

## 2020-01-08 DIAGNOSIS — M2042 Other hammer toe(s) (acquired), left foot: Secondary | ICD-10-CM | POA: Diagnosis not present

## 2020-01-08 DIAGNOSIS — M2041 Other hammer toe(s) (acquired), right foot: Secondary | ICD-10-CM

## 2020-01-08 NOTE — Progress Notes (Signed)
  Subjective:  Patient ID: [name], female    DOB: August 24, 1955,  MRN: [id number]  Chief Complaint  Patient presents with  . Routine Post Op    POV #3 DOS 12/09/19 [place] BUNIONECTOMY LT, HAMMERTOE REPAIR 2,3,4&5 LT   DOS: 12/09/19 Procedure: [place] bunionectomy left, hammertoe corrections 2,3,4,5 left   64 y.o. female presents with the above complaint. History confirmed with patient.   Objective:  Physical Exam: tenderness at the surgical site, local edema noted and calf supple, nontender. Incision: healing well, no dehiscence, no significant erythema  Radiographs: taken and reviewed. Osteotomies in good position. 1st met osteotomy still evident without full bridging. Assessment:   1. Hammer toes of both feet     Plan:  Patient was evaluated and treated and all questions answered.  Post-operative State -XR taken and reviewed.  Good positioning. Pins not ready to be pulled. -Dressing replaced consisting of telfa, 4x4, kerlix, ACE bandage. -Continue CAM boot -Repeat XRs in 2 weeks. Plan for pin removal.  Return in about 2 weeks (around 01/22/2020) for Post-Op (with XRs).

## 2020-01-12 ENCOUNTER — Other Ambulatory Visit: Payer: Self-pay | Admitting: Podiatry

## 2020-01-12 DIAGNOSIS — M2042 Other hammer toe(s) (acquired), left foot: Secondary | ICD-10-CM

## 2020-01-22 ENCOUNTER — Ambulatory Visit (INDEPENDENT_AMBULATORY_CARE_PROVIDER_SITE_OTHER): Payer: Medicare Other | Admitting: Podiatry

## 2020-01-22 ENCOUNTER — Ambulatory Visit (INDEPENDENT_AMBULATORY_CARE_PROVIDER_SITE_OTHER): Payer: Medicare Other

## 2020-01-22 ENCOUNTER — Other Ambulatory Visit: Payer: Self-pay

## 2020-01-22 DIAGNOSIS — Z9889 Other specified postprocedural states: Secondary | ICD-10-CM

## 2020-01-22 DIAGNOSIS — M2012 Hallux valgus (acquired), left foot: Secondary | ICD-10-CM

## 2020-01-22 MED ORDER — OXYCODONE-ACETAMINOPHEN 5-325 MG PO TABS: 1.0000 | ORAL_TABLET | ORAL | 0 refills | Status: DC | PRN

## 2020-01-22 NOTE — Progress Notes (Signed)
  Subjective:  Patient ID: [name], female    DOB: [birth year],  MRN: [id number]  Chief Complaint  Patient presents with  . Routine Post Op    [place] DOS 6.16.2021 Left foot correction of bunion with cutting and shifting of bone, correction of hammertoes 2nd ,3rd ,4th, and 5th toes. Pt denies fever/chills/nausea/vomiting and states she has no concerns.   DOS: 12/09/19 Procedure: [place] bunionectomy left, hammertoe corrections 2,3,4,5 left   64 y.o. female presents with the above complaint. History confirmed with patient.   Objective:  Physical Exam: tenderness at the surgical site, local edema noted and calf supple, nontender. Incision: healing well, no dehiscence, no significant erythema without full bridging. Radiographs: taken and reviewed. Osteotomies in good position. 1st met osteotomy with progressive bridging. Assessment:   1. Hallux valgus, left   2. Post-operative state     Plan:  Patient was evaluated and treated and all questions answered.  Post-operative State -XR taken and reviewed. Good positioning some bridging noted -Pins removed today -Ok to shower -Surgical shoe dispensed. -F/u in 3 weeks for repeat XRs.  Return in about 3 weeks (around 02/12/2020) for Post-Op (with XRs).

## 2020-01-29 ENCOUNTER — Other Ambulatory Visit: Payer: Self-pay

## 2020-01-29 ENCOUNTER — Encounter (INDEPENDENT_AMBULATORY_CARE_PROVIDER_SITE_OTHER): Payer: Self-pay | Admitting: Primary Care

## 2020-01-29 ENCOUNTER — Ambulatory Visit (INDEPENDENT_AMBULATORY_CARE_PROVIDER_SITE_OTHER): Payer: Medicare Other | Admitting: Primary Care

## 2020-01-29 VITALS — BP 172/96 | HR 76 | Temp 97.8°F | Ht 62.0 in | Wt 112.6 lb

## 2020-01-29 DIAGNOSIS — Z23 Encounter for immunization: Secondary | ICD-10-CM | POA: Diagnosis not present

## 2020-01-29 DIAGNOSIS — R61 Generalized hyperhidrosis: Secondary | ICD-10-CM

## 2020-01-29 DIAGNOSIS — I10 Essential (primary) hypertension: Secondary | ICD-10-CM

## 2020-01-29 MED ORDER — AMLODIPINE BESYLATE 10 MG PO TABS: 10.0000 mg | ORAL_TABLET | Freq: Every day | ORAL | 3 refills | Status: DC

## 2020-01-29 MED ORDER — LOSARTAN POTASSIUM-HCTZ 100-25 MG PO TABS: 1.0000 | ORAL_TABLET | Freq: Every day | ORAL | 3 refills | Status: DC

## 2020-01-29 NOTE — Progress Notes (Signed)
Established Patient Office Visit  Subjective:  Patient ID: [name], female    DOB: 1956-01-29  Age: 64 y.o. MRN: [id number]  CC:  Chief Complaint  Patient presents with  . Night Sweats    HPI [name] is a 64 year old female who presents for night sweats.   Past Medical History:  Diagnosis Date  . Anemia    hx  . Arthritis    "all over" (06/19/2017)  . Diabetes mellitus without complication (HCC)    diet control  . Hyperlipidemia   . Hypertension   . TIA (transient ischemic attack)    pt unaware of this hx on 06/19/2017    Past Surgical History:  Procedure Laterality Date  . ANTERIOR CERVICAL DECOMP/DISCECTOMY FUSION    . BACK SURGERY    . CESAREAN SECTION  1986  . IR GENERIC HISTORICAL  09/21/2014   IR ANGIO INTRA EXTRACRAN SEL INTERNAL CAROTID UNI L MOD SED 09/21/2014 [name], MD [facility]  . IR GENERIC HISTORICAL  09/21/2014   IR ANGIO INTRA EXTRACRAN SEL COM CAROTID INNOMINATE UNI R MOD SED 09/21/2014 [name], MD [facility]  . IR GENERIC HISTORICAL  09/21/2014   IR ANGIO VERTEBRAL SEL VERTEBRAL UNI L MOD SED 09/21/2014 [name], MD [facility]  . IR GENERIC HISTORICAL  09/21/2014   IR 3D INDEPENDENT WKST 09/21/2014 [name], MD [facility]  . LAPAROSCOPIC CHOLECYSTECTOMY    . TUBAL LIGATION      Family History  Problem Relation Age of Onset  . Hypertension Mother   . Arthritis Mother   . Diabetes Mother   . Dementia Mother   . Heart disease Father   . Diabetes Sister   . Diabetes Brother   . Diabetes Sister   . Cancer Neg Hx   . Colon cancer Neg Hx   . Colon polyps Neg Hx   . Esophageal cancer Neg Hx   . Rectal cancer Neg Hx   . Stomach cancer Neg Hx     Social History   Socioeconomic History  . Marital status: Widowed    Spouse name: Not on file  . Number of children: Not on file  . Years of education: Not on file  . Highest education level: Not on file  Occupational History   . Not on file  Tobacco Use  . Smoking status: Never Smoker  . Smokeless tobacco: Never Used  Vaping Use  . Vaping Use: Never used  Substance and Sexual Activity  . Alcohol use: Yes    Alcohol/week: 3.0 standard drinks    Types: 3 Standard drinks or equivalent per week    Comment: socially   . Drug use: Not Currently  . Sexual activity: Yes  Other Topics Concern  . 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 Difficulty of Paying Living Expenses:   Food Insecurity:   . Worried About Charity fundraiser in the Last Year:   . Arboriculturist in the Last Year:   Transportation Needs:   . Film/video editor (Medical):   [name] Lack of Transportation (Non-Medical):   Physical Activity:   . Days of Exercise per Week:   . Minutes of Exercise per Session:   Stress:   .  Feeling of Stress :   Social Connections:   . Frequency of Communication with Friends and Family:   . Frequency of Social Gatherings with Friends and Family:   . Attends Religious Services:   . Active Member of Clubs or Organizations:   . Attends Archivist Meetings:   [name] Marital Status:   Intimate Partner Violence:   . Fear of Current or Ex-Partner:   . Emotionally Abused:   [name] Physically Abused:   . Sexually Abused:     Outpatient Medications Prior to Visit  Medication Sig Dispense Refill  . aspirin EC 81 MG tablet Take 1 tablet (81 mg total) by mouth daily. 90 tablet 3  . cephALEXin (KEFLEX) 500 MG capsule Take 1 capsule (500 mg total) by mouth 2 (two) times daily. 14 capsule 0  . feeding supplement, ENSURE ENLIVE, (ENSURE ENLIVE) LIQD Take 237 mLs by mouth 3 (three) times daily between meals. 237 mL 12  . meclizine (ANTIVERT) 25 MG tablet Take 1 tablet (25 mg total) by mouth 3 (three) times daily as needed for  dizziness. 30 tablet 1  . ondansetron (ZOFRAN ODT) 4 MG disintegrating tablet Take 1 tablet (4 mg total) by mouth every 8 (eight) hours as needed for nausea or vomiting. 12 tablet 0  . ondansetron (ZOFRAN) 4 MG tablet Take 1 tablet (4 mg total) by mouth every 8 (eight) hours as needed for nausea or vomiting. 20 tablet 0  . oxyCODONE-acetaminophen (PERCOCET) 5-325 MG tablet Take 1 tablet by mouth every 4 (four) hours as needed for severe pain. 20 tablet 0  . promethazine (PHENERGAN) 25 MG tablet Take 1 tablet (25 mg total) by mouth every 8 (eight) hours as needed for nausea or vomiting. 20 tablet 0  . Sodium Sulfate-Mag Sulfate-KCl (SUTAB) [phone] MG TABS Take 1 kit by mouth as directed. Lot [id number], exp. 3/22 24 tablet 0  . losartan-hydrochlorothiazide (HYZAAR) 100-25 MG tablet Take 1 tablet by mouth daily. 90 tablet 3   Facility-Administered Medications Prior to Visit  Medication Dose Route Frequency Provider Last Rate Last Admin  . 0.9 %  sodium chloride infusion  500 mL Intravenous Once Ladene Artist, MD        No Known Allergies  ROS Review of Systems  Endocrine: Positive for heat intolerance.  All other systems reviewed and are negative.     Objective:    Physical Exam Vitals reviewed.  Constitutional:      Appearance: Normal appearance.  HENT:     Nose: Nose normal.  Cardiovascular:     Rate and Rhythm: Normal rate and regular rhythm.     Heart sounds: Normal heart sounds.  Pulmonary:     Effort: Pulmonary effort is normal.     Breath sounds: Normal breath sounds.  Abdominal:     General: Bowel sounds are normal.  Musculoskeletal:        General: Normal range of motion.     Cervical back: Normal range of motion.  Skin:    General: Skin is warm and dry.  Neurological:     Mental Status: She is alert and oriented to person, place, and time.  Psychiatric:        Mood and Affect: Mood normal.        Behavior: Behavior normal.        Thought Content: Thought  content normal.        Judgment: Judgment normal.     BP (!) 172/96 (BP Location: Right Arm, Patient Position:  Sitting, Cuff Size: Small)   Pulse 76   Temp 97.8 F (36.6 C) (Oral)   Ht '5\' 2"'  (1.575 m)   Wt 112 lb 9.6 oz (51.1 kg) Comment: with boot on left foot  SpO2 99%   BMI 20.59 kg/m  Wt Readings from Last 3 Encounters:  01/29/20 112 lb 9.6 oz (51.1 kg)  11/05/19 112 lb (50.8 kg)  10/22/19 112 lb (50.8 kg)     Health Maintenance Due  Topic Date Due  . COVID-19 Vaccine (1) Never done  . PAP SMEAR-Modifier  02/01/2019  . INFLUENZA VACCINE  01/24/2020    There are no preventive care reminders to display for this patient.  Lab Results  Component Value Date   TSH 1.900 01/29/2020   Lab Results  Component Value Date   WBC 11.5 (H) 08/29/2019   HGB 13.4 08/29/2019   HCT 41.4 08/29/2019   MCV 86.4 08/29/2019   PLT 324 08/29/2019   Lab Results  Component Value Date   NA 140 08/29/2019   K 3.8 08/29/2019   CO2 28 08/29/2019   GLUCOSE 117 (H) 08/29/2019   BUN 13 08/29/2019   CREATININE 0.70 08/29/2019   BILITOT 0.6 08/29/2019   ALKPHOS 76 08/29/2019   AST 16 08/29/2019   ALT 15 08/29/2019   PROT 7.4 08/29/2019   ALBUMIN 4.2 08/29/2019   CALCIUM 10.0 08/29/2019   ANIONGAP 9 08/29/2019   Lab Results  Component Value Date   CHOL 238 (H) 10/05/2019   Lab Results  Component Value Date   HDL 71 10/05/2019   Lab Results  Component Value Date   LDLCALC 154 (H) 10/05/2019   Lab Results  Component Value Date   TRIG 74 10/05/2019   Lab Results  Component Value Date   CHOLHDL 3.4 10/05/2019   Lab Results  Component Value Date   HGBA1C 5.8 (A) 09/07/2019      Assessment & Plan:  Cyncere was seen today for night sweats.  Diagnoses and all orders for this visit  Essential hypertension Counseled on blood pressure goal of less than 130/80, low-sodium, DASH diet, medication compliance, 150 minutes of moderate intensity exercise per week. Discussed  medication compliance, adverse effects. -     losartan-hydrochlorothiazide (HYZAAR) 100-25 MG tablet; Take 1 tablet by mouth daily. -     amLODipine (NORVASC) 10 MG tablet; Take 1 tablet (10 mg total) by mouth daily.   Essential hypertension -     losartan-hydrochlorothiazide (HYZAAR) 100-25 MG tablet; Take 1 tablet by mouth daily. -     amLODipine (NORVASC) 10 MG tablet; Take 1 tablet (10 mg total) by mouth daily.  Need for prophylactic vaccination against Streptococcus pneumoniae (pneumococcus) -     Pneumococcal conjugate vaccine 13-valent  Hyperhidrosis Explained what Hyperhidrosis is when the body sweats a lot more than normal (excessively). Provided information on the AVS. -     TSH + free T4    Follow-up: Return schedule pap / re check -2 weeks.    [name], NP

## 2020-01-29 NOTE — Progress Notes (Signed)
Unable to rest due to night sweats

## 2020-01-29 NOTE — Patient Instructions (Addendum)
Hyperhidrosis Hyperhidrosis is a condition in which the body sweats a lot more than normal (excessively). Sweating is a necessary function for a human body. It is normal to sweat when you are hot, physically active, or anxious. However, hyperhidrosis is sweating to an excessive degree. Although the condition is not a serious one, it can make you feel embarrassed. There are two kinds of hyperhidrosis:  Primary hyperhidrosis. The sweating usually localizes in one part of your body, such as your underarms, or in a few areas, such as your feet, face, underarms, and hands. This is the more common kind of hyperhidrosis.  Secondary hyperhidrosis. This type usually affects your entire body. What are the causes? The cause of this condition depends on the kind of hyperhidrosis that you have.  Primary hyperhidrosis may be caused by sweat glands that are more active than normal.  Secondary hyperhidrosis may be caused by an underlying condition or by taking certain medicines, such as antidepressants or diabetes medicines. Possible conditions that may cause secondary hyperhidrosis include: ? Diabetes. ? Gout. ? Anxiety. ? Obesity. ? Menopause. ? Overactive thyroid (hyperthyroidism). ? Tumors. ? Frostbite. ? Certain types of cancers. ? Alcoholism. ? Injury to your nervous system. ? Stroke. ? Parkinson's disease. What increases the risk? You are more likely to develop primary hyperhidrosis if you have a family history of the condition. What are the signs or symptoms? Symptoms of this condition include:  Feeling like you are sweating constantly, even while you are not being active.  Having skin that peels or gets paler or softer in the areas where you sweat the most.  Being able to see sweat on your skin. Other symptoms depend on the kind of hyperhidrosis that you have.  Symptoms of primary hyperhidrosis may include: ? Sweating in the same location on both sides of your body. ? Sweating only  during the day and not while you are sleeping. ? Sweating in specific areas, such as your underarms, palms, feet, and face.  Symptoms of secondary hyperhidrosis may include: ? Sweating all over your body. ? Sweating even while you sleep. How is this diagnosed? This condition may be diagnosed by:  Medical history.  Physical exam. You may also have other tests, including:  Tests to measure the amount of sweat you produce and to show the areas where you sweat the most. These tests may involve: ? Using color-changing chemicals to show patterns of sweating on the skin. ? Weighing paper that has been applied to the skin. This will show the amount of sweat that your body produces. ? Measuring the amount of water that evaporates from the skin. ? Using infrared technology to show patterns of sweating on the skin.  Tests to check for other conditions that may be causing excess sweating. This may include blood, urine, or imaging tests. How is this treated? Treatment for this condition depends on the kind of hyperhidrosis that you have and the areas of your body that are affected. Your health care provider will also treat any underlying conditions. Treatment may include:  Medicines, such as: ? Antiperspirants. These are medicines that stop sweat. ? Injectable medicines. These may include small injections of botulinum toxin. ? Oral medicines. These are taken by mouth to treat underlying conditions and other symptoms.  A procedure to: ? Temporarily turn off the sweat glands in your hands and feet (iontophoresis). ? Remove your sweat glands. ? Cut or destroy the nerves so that they do not send a signal to the sweat  glands (sympathectomy). Follow these instructions at home: Lifestyle   Limit or avoid foods or beverages that may increase your risk of sweating, such as: ? Spicy food. ? Caffeine. ? Alcohol. ? Foods that contain monosodium glutamate (MSG).  If your feet sweat: ? Wear  sandals when possible. ? Do not wear cotton socks. Wear socks that remove or wick moisture from your feet. ? Wear leather shoes. ? Avoid wearing the same pair of shoes for two days in a row.  Try placing sweat pads under your clothes to prevent underarm sweat from showing.  Keep a journal of your sweat symptoms and when they occur. This may help you identify things that trigger your sweating. General instructions  Take over-the-counter and prescription medicines only as told by your health care provider.  Use antiperspirants as told by your health care provider.  Consider joining a hyperhidrosis support group.  Keep all follow-up visits as told by your health care provider. This is important. Contact a health care provider if:  You have new symptoms.  Your symptoms get worse. Summary  Hyperhidrosis is a condition in which the body sweats a lot more than normal (excessively).  With primary hyperhidrosis, the sweating usually localizes in one part of your body, such as your underarms, or in a few areas, such as your feet, face, underarms, and hands. It is caused by overactive sweat glands in the affected area.  With secondary hyperhidrosis, the sweating affects your entire body. This is caused by an underlying condition.  Treatment for this condition depends on the kind of hyperhidrosis that you have and the parts of your body that are affected. This information is not intended to replace advice given to you by your health care provider. Make sure you discuss any questions you have with your health care provider. Document Revised: 04/14/2019 Document Reviewed: 06/14/2017 Elsevier Patient Education  2020 [employer].  Managing Your Hypertension Hypertension is commonly called high blood pressure. This is when the force of your blood pressing against the walls of your arteries is too strong. Arteries are blood vessels that carry blood from your heart throughout your body.  Hypertension forces the heart to work harder to pump blood, and may cause the arteries to become narrow or stiff. Having untreated or uncontrolled hypertension can cause heart attack, stroke, kidney disease, and other problems. What are blood pressure readings? A blood pressure reading consists of a higher number over a lower number. Ideally, your blood pressure should be below 120/80. The first ("top") number is called the systolic pressure. It is a measure of the pressure in your arteries as your heart beats. The second ("bottom") number is called the diastolic pressure. It is a measure of the pressure in your arteries as the heart relaxes. What does my blood pressure reading mean? Blood pressure is classified into four stages. Based on your blood pressure reading, your health care provider may use the following stages to determine what type of treatment you need, if any. Systolic pressure and diastolic pressure are measured in a unit called mm Hg. Normal  Systolic pressure: below 229.  Diastolic pressure: below 80. Elevated  Systolic pressure: 798-921.  Diastolic pressure: below 80. Hypertension stage 1  Systolic pressure: 194-174.  Diastolic pressure: 08-14. Hypertension stage 2  Systolic pressure: 481 or above.  Diastolic pressure: 90 or above. What health risks are associated with hypertension? Managing your hypertension is an important responsibility. Uncontrolled hypertension can lead to:  A heart attack.  A stroke.  A weakened blood vessel (aneurysm).  Heart failure.  Kidney damage.  Eye damage.  Metabolic syndrome.  Memory and concentration problems. What changes can I make to manage my hypertension? Hypertension can be managed by making lifestyle changes and possibly by taking medicines. Your health care provider will help you make a plan to bring your blood pressure within a normal range. Eating and drinking   Eat a diet that is high in fiber and potassium,  and low in salt (sodium), added sugar, and fat. An example eating plan is called the DASH (Dietary Approaches to Stop Hypertension) diet. To eat this way: ? Eat plenty of fresh fruits and vegetables. Try to fill half of your plate at each meal with fruits and vegetables. ? Eat whole grains, such as whole wheat pasta, brown rice, or whole grain bread. Fill about one quarter of your plate with whole grains. ? Eat low-fat diary products. ? Avoid fatty cuts of meat, processed or cured meats, and poultry with skin. Fill about one quarter of your plate with lean proteins such as fish, chicken without skin, beans, eggs, and tofu. ? Avoid premade and processed foods. These tend to be higher in sodium, added sugar, and fat.  Reduce your daily sodium intake. Most people with hypertension should eat less than 1,500 mg of sodium a day.  Limit alcohol intake to no more than 1 drink a day for nonpregnant women and 2 drinks a day for men. One drink equals 12 oz of beer, 5 oz of wine, or 1 oz of hard liquor. Lifestyle  Work with your health care provider to maintain a healthy body weight, or to lose weight. Ask what an ideal weight is for you.  Get at least 30 minutes of exercise that causes your heart to beat faster (aerobic exercise) most days of the week. Activities may include walking, swimming, or biking.  Include exercise to strengthen your muscles (resistance exercise), such as weight lifting, as part of your weekly exercise routine. Try to do these types of exercises for 30 minutes at least 3 days a week.  Do not use any products that contain nicotine or tobacco, such as cigarettes and e-cigarettes. If you need help quitting, ask your health care provider.  Control any long-term (chronic) conditions you have, such as high cholesterol or diabetes. Monitoring  Monitor your blood pressure at home as told by your health care provider. Your personal target blood pressure may vary depending on your medical  conditions, your age, and other factors.  Have your blood pressure checked regularly, as often as told by your health care provider. Working with your health care provider  Review all the medicines you take with your health care provider because there may be side effects or interactions.  Talk with your health care provider about your diet, exercise habits, and other lifestyle factors that may be contributing to hypertension.  Visit your health care provider regularly. Your health care provider can help you create and adjust your plan for managing hypertension. Will I need medicine to control my blood pressure? Your health care provider may prescribe medicine if lifestyle changes are not enough to get your blood pressure under control, and if:  Your systolic blood pressure is 130 or higher.  Your diastolic blood pressure is 80 or higher. Take medicines only as told by your health care provider. Follow the directions carefully. Blood pressure medicines must be taken as prescribed. The medicine does not work as well when you skip doses.  Skipping doses also puts you at risk for problems. Contact a health care provider if:  You think you are having a reaction to medicines you have taken.  You have repeated (recurrent) headaches.  You feel dizzy.  You have swelling in your ankles.  You have trouble with your vision. Get help right away if:  You develop a severe headache or confusion.  You have unusual weakness or numbness, or you feel faint.  You have severe pain in your chest or abdomen.  You vomit repeatedly.  You have trouble breathing. Summary  Hypertension is when the force of blood pumping through your arteries is too strong. If this condition is not controlled, it may put you at risk for serious complications.  Your personal target blood pressure may vary depending on your medical conditions, your age, and other factors. For most people, a normal blood pressure is less  than 120/80.  Hypertension is managed by lifestyle changes, medicines, or both. Lifestyle changes include weight loss, eating a healthy, low-sodium diet, exercising more, and limiting alcohol. This information is not intended to replace advice given to you by your health care provider. Make sure you discuss any questions you have with your health care provider. Document Revised: 10/03/2018 Document Reviewed: 05/09/2016 Elsevier Patient Education  [employer].

## 2020-01-30 LAB — TSH+FREE T4
Free T4: 1.04 ng/dL (ref 0.82–1.77)
TSH: 1.9 u[IU]/mL (ref 0.450–4.500)

## 2020-02-12 ENCOUNTER — Ambulatory Visit (INDEPENDENT_AMBULATORY_CARE_PROVIDER_SITE_OTHER): Payer: Medicare Other

## 2020-02-12 ENCOUNTER — Other Ambulatory Visit: Payer: Self-pay

## 2020-02-12 ENCOUNTER — Ambulatory Visit (INDEPENDENT_AMBULATORY_CARE_PROVIDER_SITE_OTHER): Payer: Medicare Other | Admitting: Podiatry

## 2020-02-12 ENCOUNTER — Ambulatory Visit (INDEPENDENT_AMBULATORY_CARE_PROVIDER_SITE_OTHER): Payer: Medicare Other | Admitting: Primary Care

## 2020-02-12 DIAGNOSIS — M2042 Other hammer toe(s) (acquired), left foot: Secondary | ICD-10-CM

## 2020-02-12 NOTE — Progress Notes (Signed)
  Subjective:  Patient ID: [name], female    DOB: [birth year],  MRN: [id number]  Chief Complaint  Patient presents with  . Routine Post Op    DOS 6.19.2021 Pt states healing well. Denies fever/chills/nausea/vomiting. Pt states some puffyness "I may need to elevate it more".   DOS: 12/09/19 Procedure: [place] bunionectomy left, hammertoe corrections 2,3,4,5 left   64 y.o. female presents with the above complaint. History confirmed with patient.   Objective:  Physical Exam: tenderness at the surgical site, local edema noted and calf supple, nontender. Incision: healing well, no dehiscence, no significant erythema without full bridging. Radiographs: taken and reviewed. Osteotomies in good position. 1st met osteotomy with continue to progressive bridging. Assessment:   1. Hammertoe of left foot     Plan:  Patient was evaluated and treated and all questions answered.  Post-operative State -XR taken and reviewed. Good positioning incomplete but improved bridging. -Surgical shoe x1 week. Then normal shoegear. -F/u in 3 weeks for recheck -Nails debrided today courtesy.  Return in about 3 weeks (around 03/04/2020) for Post-Op (with XRs).

## 2020-02-17 ENCOUNTER — Ambulatory Visit (INDEPENDENT_AMBULATORY_CARE_PROVIDER_SITE_OTHER): Payer: Medicare Other | Admitting: Primary Care

## 2020-03-01 ENCOUNTER — Ambulatory Visit (INDEPENDENT_AMBULATORY_CARE_PROVIDER_SITE_OTHER): Payer: Medicare Other | Admitting: Primary Care

## 2020-03-04 ENCOUNTER — Telehealth: Payer: Self-pay | Admitting: Podiatry

## 2020-03-04 MED ORDER — TRAMADOL HCL 50 MG PO TABS: 50.0000 mg | ORAL_TABLET | Freq: Three times a day (TID) | ORAL | 0 refills | Status: AC | PRN

## 2020-03-04 NOTE — Telephone Encounter (Signed)
Pt called and would like pain meds but nothing too strong. Would like something to hold her over until her appointment on tuesday

## 2020-03-04 NOTE — Addendum Note (Signed)
Addended by: Hardie Pulley on: 03/04/2020 02:07 PM   Modules accepted: Orders

## 2020-03-08 ENCOUNTER — Encounter: Payer: Medicare Other | Admitting: Podiatry

## 2020-03-09 ENCOUNTER — Other Ambulatory Visit (HOSPITAL_COMMUNITY)
Admission: RE | Admit: 2020-03-09 | Discharge: 2020-03-09 | Disposition: A | Discharge status: Home / Self Care | Payer: Medicare Other | Source: Ambulatory Visit | Attending: Primary Care | Admitting: Primary Care

## 2020-03-09 ENCOUNTER — Other Ambulatory Visit: Payer: Self-pay

## 2020-03-09 ENCOUNTER — Ambulatory Visit (INDEPENDENT_AMBULATORY_CARE_PROVIDER_SITE_OTHER): Payer: Medicare Other | Admitting: Primary Care

## 2020-03-09 ENCOUNTER — Encounter (INDEPENDENT_AMBULATORY_CARE_PROVIDER_SITE_OTHER): Payer: Self-pay | Admitting: Primary Care

## 2020-03-09 VITALS — BP 149/80 | HR 64 | Temp 98.2°F | Ht 62.0 in | Wt 115.2 lb

## 2020-03-09 DIAGNOSIS — Z113 Encounter for screening for infections with a predominantly sexual mode of transmission: Secondary | ICD-10-CM | POA: Diagnosis not present

## 2020-03-09 DIAGNOSIS — I10 Essential (primary) hypertension: Secondary | ICD-10-CM | POA: Diagnosis not present

## 2020-03-09 DIAGNOSIS — Z124 Encounter for screening for malignant neoplasm of cervix: Secondary | ICD-10-CM | POA: Insufficient documentation

## 2020-03-09 DIAGNOSIS — E782 Mixed hyperlipidemia: Secondary | ICD-10-CM | POA: Diagnosis not present

## 2020-03-09 MED ORDER — ATORVASTATIN CALCIUM 20 MG PO TABS: 20.0000 mg | ORAL_TABLET | Freq: Every day | ORAL | 1 refills | Status: DC

## 2020-03-09 MED ORDER — AMLODIPINE BESYLATE 10 MG PO TABS: 10.0000 mg | ORAL_TABLET | Freq: Every day | ORAL | 3 refills | Status: DC

## 2020-03-09 MED ORDER — LOSARTAN POTASSIUM-HCTZ 100-25 MG PO TABS: 1.0000 | ORAL_TABLET | Freq: Every day | ORAL | 3 refills | Status: DC

## 2020-03-09 NOTE — Patient Instructions (Signed)

## 2020-03-12 NOTE — Progress Notes (Signed)
Established Patient Office Visit  Subjective:  Patient ID: [name], female    DOB: 05/12/1956  Age: 64 y.o. MRN: [id number]  CC:  Chief Complaint  Patient presents with  . Blood Pressure Check  . Gynecologic Exam    HPI [name] presents for gyn exam and hypertension management.  Past Medical History:  Diagnosis Date  . Anemia    hx  . Arthritis    "all over" (06/19/2017)  . Diabetes mellitus without complication (HCC)    diet control  . Hyperlipidemia   . Hypertension   . TIA (transient ischemic attack)    pt unaware of this hx on 06/19/2017    Past Surgical History:  Procedure Laterality Date  . ANTERIOR CERVICAL DECOMP/DISCECTOMY FUSION    . BACK SURGERY    . CESAREAN SECTION  1986  . IR GENERIC HISTORICAL  09/21/2014   IR ANGIO INTRA EXTRACRAN SEL INTERNAL CAROTID UNI L MOD SED 09/21/2014 [name], [facility]  . IR GENERIC HISTORICAL  09/21/2014   IR ANGIO INTRA EXTRACRAN SEL COM CAROTID INNOMINATE UNI R MOD SED 09/21/2014 [name], MD [facility]  . IR GENERIC HISTORICAL  09/21/2014   IR ANGIO VERTEBRAL SEL VERTEBRAL UNI L MOD SED 09/21/2014 [name], [facility]  . IR GENERIC HISTORICAL  09/21/2014   IR 3D INDEPENDENT WKST 09/21/2014 [name], [facility]  . LAPAROSCOPIC CHOLECYSTECTOMY    . TUBAL LIGATION      Family History  Problem Relation Age of Onset  . Hypertension Mother   . Arthritis Mother   . Diabetes Mother   . Dementia Mother   . Heart disease Father   . Diabetes Sister   . Diabetes Brother   . Diabetes Sister   . Cancer Neg Hx   . Colon cancer Neg Hx   . Colon polyps Neg Hx   . Esophageal cancer Neg Hx   . Rectal cancer Neg Hx   . Stomach cancer Neg Hx     Social History   Socioeconomic History  . Marital status: Widowed    Spouse name: Not on file  . Number of children: Not on file  . Years of education: Not on file  . Highest education level: Not on file   Occupational History  . Not on file  Tobacco Use  . Smoking status: Never Smoker  . Smokeless tobacco: Never Used  Vaping Use  . Vaping Use: Never used  Substance and Sexual Activity  . Alcohol use: Yes    Alcohol/week: 3.0 standard drinks    Types: 3 Standard drinks or equivalent per week    Comment: socially   . Drug use: Not Currently  . Sexual activity: Yes  Other Topics Concern  . 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 Difficulty of Paying Living Expenses: Not on file  Food Insecurity:   . Worried About Charity fundraiser in the Last Year: Not on file  . Ran Out of Food in the Last Year: Not on file  Transportation Needs:   . Lack of Transportation (Medical): Not on file  . Lack of Transportation (Non-Medical): Not on file  Physical Activity:   . Days of Exercise per Week: Not on file  .  Minutes of Exercise per Session: Not on file  Stress:   . Feeling of Stress : Not on file  Social Connections:   . Frequency of Communication with Friends and Family: Not on file  . Frequency of Social Gatherings with Friends and Family: Not on file  . Attends Religious Services: Not on file  . Active Member of Clubs or Organizations: Not on file  . Attends Archivist Meetings: Not on file  . Marital Status: Not on file  Intimate Partner Violence:   . Fear of Current or Ex-Partner: Not on file  . Emotionally Abused: Not on file  . Physically Abused: Not on file  . Sexually Abused: Not on file    Outpatient Medications Prior to Visit  Medication Sig Dispense Refill  . aspirin EC 81 MG tablet Take 1 tablet (81 mg total) by mouth daily. 90 tablet 3  . carvedilol (COREG) 3.125 MG tablet Take 3.125 mg by mouth 2 (two) times daily.    . feeding supplement, ENSURE ENLIVE, (ENSURE  ENLIVE) LIQD Take 237 mLs by mouth 3 (three) times daily between meals. 237 mL 12  . meclizine (ANTIVERT) 25 MG tablet Take 1 tablet (25 mg total) by mouth 3 (three) times daily as needed for dizziness. 30 tablet 1  . ondansetron (ZOFRAN ODT) 4 MG disintegrating tablet Take 1 tablet (4 mg total) by mouth every 8 (eight) hours as needed for nausea or vomiting. 12 tablet 0  . oxyCODONE-acetaminophen (PERCOCET) 5-325 MG tablet Take 1 tablet by mouth every 4 (four) hours as needed for severe pain. 20 tablet 0  . Sodium Sulfate-Mag Sulfate-KCl (SUTAB) [phone] MG TABS Take 1 kit by mouth as directed. Lot [id number], exp. 3/22 24 tablet 0  . traMADol (ULTRAM) 50 MG tablet Take 1 tablet (50 mg total) by mouth every 8 (eight) hours as needed for up to 5 days. 15 tablet 0  . amLODipine (NORVASC) 10 MG tablet Take 1 tablet (10 mg total) by mouth daily. 90 tablet 3  . atorvastatin (LIPITOR) 20 MG tablet Take 20 mg by mouth daily.    [name] losartan-hydrochlorothiazide (HYZAAR) 100-25 MG tablet Take 1 tablet by mouth daily. 90 tablet 3  . cephALEXin (KEFLEX) 500 MG capsule Take 1 capsule (500 mg total) by mouth 2 (two) times daily. 14 capsule 0  . ondansetron (ZOFRAN) 4 MG tablet Take 1 tablet (4 mg total) by mouth every 8 (eight) hours as needed for nausea or vomiting. 20 tablet 0  . promethazine (PHENERGAN) 25 MG tablet Take 1 tablet (25 mg total) by mouth every 8 (eight) hours as needed for nausea or vomiting. 20 tablet 0  . 0.9 %  sodium chloride infusion      No facility-administered medications prior to visit.    No Known Allergies  ROS Review of Systems    Objective:    BP (!) 149/80 (BP Location: Right Arm, Patient Position: Sitting, Cuff Size: Small)   Pulse 64   Temp 98.2 F (36.8 C) (Temporal)   Ht '5\' 2"'  (1.575 m)   Wt 115 lb 3.2 oz (52.3 kg)   SpO2 98%   BMI 21.07 kg/m  Wt Readings from Last 3 Encounters:  03/09/20 115 lb 3.2 oz (52.3 kg)  01/29/20 112 lb 9.6 oz (51.1 kg)  11/05/19  112 lb (50.8 kg)   Physical Exam CONSTITUTIONAL: Well-developed, well-nourished female in no acute distress.  HENT:  Normocephalic, atraumatic, External right and left ear normal. Oropharynx is clear and moist  EYES: Conjunctivae and EOM are normal. Pupils are equal, round, and reactive to light. No scleral icterus.  NECK: Normal range of motion, supple, no masses.  Normal thyroid.  SKIN: Skin is warm and dry. No rash noted. Not diaphoretic. No erythema. No pallor. [place]: Alert and oriented to person, place, and time. Normal reflexes, muscle tone coordination. No cranial nerve deficit noted. PSYCHIATRIC: Normal mood and affect. Normal behavior. Normal judgment and thought content. CARDIOVASCULAR: Normal heart rate noted, regular rhythm RESPIRATORY: Clear to auscultation bilaterally. Effort and breath sounds normal, no problems with respiration noted. BREASTS: Taught SBE ABDOMEN: Soft, normal bowel sounds, no distention noted.  No tenderness, rebound or guarding.  PELVIC: Normal appearing external genitalia; normal appearing vaginal mucosa and cervix.  No abnormal discharge noted.  Pap smear obtained.  Normal uterine size, no other palpable masses, no uterine or adnexal tenderness. MUSCULOSKELETAL: Normal range of motion. No tenderness.  No cyanosis, clubbing, or edema.  2+ distal pulses.  Wt Readings from Last 3 Encounters:  03/09/20 115 lb 3.2 oz (52.3 kg)  01/29/20 112 lb 9.6 oz (51.1 kg)  11/05/19 112 lb (50.8 kg)     Health Maintenance Due  Topic Date Due  . PAP SMEAR-Modifier  02/01/2019  . INFLUENZA VACCINE  01/24/2020    There are no preventive care reminders to display for this patient.  Lab Results  Component Value Date   TSH 1.900 01/29/2020   Lab Results  Component Value Date   WBC 11.5 (H) 08/29/2019   HGB 13.4 08/29/2019   HCT 41.4 08/29/2019   MCV 86.4 08/29/2019   PLT 324 08/29/2019   Lab Results  Component Value Date   NA 140 08/29/2019   K 3.8  08/29/2019   CO2 28 08/29/2019   GLUCOSE 117 (H) 08/29/2019   BUN 13 08/29/2019   CREATININE 0.70 08/29/2019   BILITOT 0.6 08/29/2019   ALKPHOS 76 08/29/2019   AST 16 08/29/2019   ALT 15 08/29/2019   PROT 7.4 08/29/2019   ALBUMIN 4.2 08/29/2019   CALCIUM 10.0 08/29/2019   ANIONGAP 9 08/29/2019   Lab Results  Component Value Date   CHOL 238 (H) 10/05/2019   Lab Results  Component Value Date   HDL 71 10/05/2019   Lab Results  Component Value Date   LDLCALC 154 (H) 10/05/2019   Lab Results  Component Value Date   TRIG 74 10/05/2019   Lab Results  Component Value Date   CHOLHDL 3.4 10/05/2019   Lab Results  Component Value Date   HGBA1C 5.8 (A) 09/07/2019      Assessment & Plan:  Ceilidh was seen today for blood pressure check and gynecologic exam.  Diagnoses and all orders for this visit:  Screening for STD (sexually transmitted disease) -     Cervicovaginal ancillary only  Pap smear for cervical cancer screening -     Cytology - PAP([facility])  Essential hypertension Counseled on blood pressure goal of less than 130/80, low-sodium, DASH diet, medication compliance, 150 minutes of moderate intensity exercise per week. Discussed medication compliance, adverse effects. -     losartan-hydrochlorothiazide (HYZAAR) 100-25 MG tablet; Take 1 tablet by mouth daily. -     amLODipine (NORVASC) 10 MG tablet; Take 1 tablet (10 mg total) by mouth daily.  Mixed hyperlipidemia Recommend  Decreasing your fatty foods, red meat, cheese, milk and increase fiber like whole grains and veggies.   -     atorvastatin (LIPITOR) 20 MG tablet; Take 1 tablet (20  mg total) by mouth daily.    Meds ordered this encounter  Medications  . losartan-hydrochlorothiazide (HYZAAR) 100-25 MG tablet    Sig: Take 1 tablet by mouth daily.    Dispense:  90 tablet    Refill:  3  . amLODipine (NORVASC) 10 MG tablet    Sig: Take 1 tablet (10 mg total) by mouth daily.    Dispense:  90 tablet     Refill:  3  . atorvastatin (LIPITOR) 20 MG tablet    Sig: Take 1 tablet (20 mg total) by mouth daily.    Dispense:  90 tablet    Refill:  1    Follow-up: Return in about 3 months (around 06/08/2020).    [name], NP

## 2020-03-14 LAB — CERVICOVAGINAL ANCILLARY ONLY
Bacterial Vaginitis (gardnerella): NEGATIVE
Candida Glabrata: NEGATIVE
Candida Vaginitis: NEGATIVE
Chlamydia: NEGATIVE
Comment: NEGATIVE
Comment: NEGATIVE
Comment: NEGATIVE
Comment: NEGATIVE
Comment: NEGATIVE
Comment: NORMAL
Neisseria Gonorrhea: NEGATIVE
Trichomonas: NEGATIVE

## 2020-03-15 LAB — CYTOLOGY - PAP: Diagnosis: NEGATIVE

## 2020-03-22 ENCOUNTER — Other Ambulatory Visit: Payer: Self-pay

## 2020-03-22 ENCOUNTER — Ambulatory Visit (INDEPENDENT_AMBULATORY_CARE_PROVIDER_SITE_OTHER): Payer: Medicare Other

## 2020-03-22 ENCOUNTER — Ambulatory Visit (INDEPENDENT_AMBULATORY_CARE_PROVIDER_SITE_OTHER): Payer: Medicare Other | Admitting: Podiatry

## 2020-03-22 DIAGNOSIS — M2012 Hallux valgus (acquired), left foot: Secondary | ICD-10-CM

## 2020-03-22 DIAGNOSIS — M2042 Other hammer toe(s) (acquired), left foot: Secondary | ICD-10-CM | POA: Diagnosis not present

## 2020-03-22 DIAGNOSIS — Z9889 Other specified postprocedural states: Secondary | ICD-10-CM

## 2020-03-23 ENCOUNTER — Ambulatory Visit (INDEPENDENT_AMBULATORY_CARE_PROVIDER_SITE_OTHER): Payer: Medicare Other | Admitting: Primary Care

## 2020-03-23 NOTE — Progress Notes (Signed)
  Subjective:  Patient ID: [name], female    DOB: 05/03/1956,  MRN: [id number]  Chief Complaint  Patient presents with  . Routine Post Op    DOS 6.16.2021 Pt states healing well without any concerns. Denies fever/nausea/vomiting/chills. Pt states the exercises she is doing for her toes are uncomfortable, no pain otherwise.   DOS: 12/09/19 Procedure: [place] bunionectomy left, hammertoe corrections 2,3,4,5 left   64 y.o. female presents with the above complaint. History confirmed with patient. States doing the exercises hurt but otherwise she is having no pain and wearing normal shoes without issues.  Objective:  Physical Exam: tenderness at the surgical site, local edema noted and calf supple, nontender. Incision: healing well, no dehiscence, no significant erythema  Radiographs: taken and reviewed. Osteotomies fully healed. Assessment:   1. Hammertoe of left foot   2. Hallux valgus, left   3. Post-operative state     Plan:  Patient was evaluated and treated and all questions answered.  Post-operative State -XR taken and reviewed. Fully healed -Patient to continue ROM exercises. -Continue normal shoegear.  Return in about 2 months (around 05/22/2020) for bunion f/u.

## 2020-03-31 ENCOUNTER — Ambulatory Visit (INDEPENDENT_AMBULATORY_CARE_PROVIDER_SITE_OTHER): Payer: Medicare Other | Admitting: Primary Care

## 2020-05-02 ENCOUNTER — Ambulatory Visit (INDEPENDENT_AMBULATORY_CARE_PROVIDER_SITE_OTHER): Payer: Medicare Other | Admitting: Primary Care

## 2020-05-02 ENCOUNTER — Other Ambulatory Visit: Payer: Self-pay

## 2020-05-02 ENCOUNTER — Encounter (INDEPENDENT_AMBULATORY_CARE_PROVIDER_SITE_OTHER): Payer: Self-pay | Admitting: Primary Care

## 2020-05-02 VITALS — BP 179/108 | HR 83 | Temp 97.2°F | Ht 62.0 in | Wt 115.4 lb

## 2020-05-02 DIAGNOSIS — I10 Essential (primary) hypertension: Secondary | ICD-10-CM | POA: Diagnosis not present

## 2020-05-02 DIAGNOSIS — Z23 Encounter for immunization: Secondary | ICD-10-CM

## 2020-05-02 DIAGNOSIS — M545 Low back pain, unspecified: Secondary | ICD-10-CM | POA: Diagnosis not present

## 2020-05-02 MED ORDER — TIZANIDINE HCL 2 MG PO CAPS: 2.0000 mg | ORAL_CAPSULE | Freq: Three times a day (TID) | ORAL | 1 refills | Status: DC | PRN

## 2020-05-02 MED ORDER — IBUPROFEN 600 MG PO TABS: 600.0000 mg | ORAL_TABLET | Freq: Three times a day (TID) | ORAL | 1 refills | Status: DC | PRN

## 2020-05-02 MED ORDER — KETOROLAC TROMETHAMINE 60 MG/2ML IM SOLN: 60.0000 mg | Freq: Once | INTRAMUSCULAR | Status: DC

## 2020-05-02 NOTE — Progress Notes (Signed)
Established Patient Office Visit  Subjective:  Patient ID: [name], female    DOB: 28-Oct-1955  Age: 64 y.o. MRN: [id number]  CC:  Chief Complaint  Patient presents with  . 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  Past Medical History:  Diagnosis Date  . Anemia    hx  . Arthritis    "all over" (06/19/2017)  . Diabetes mellitus without complication (HCC)    diet control  . Hyperlipidemia   . Hypertension   . TIA (transient ischemic attack)    pt unaware of this hx on 06/19/2017    Past Surgical History:  Procedure Laterality Date  . ANTERIOR CERVICAL DECOMP/DISCECTOMY FUSION    . BACK SURGERY    . CESAREAN SECTION  1986  . IR GENERIC HISTORICAL  09/21/2014   IR ANGIO INTRA EXTRACRAN SEL INTERNAL CAROTID UNI L MOD SED 09/21/2014 [name], [facility]  . IR GENERIC HISTORICAL  09/21/2014   IR ANGIO INTRA EXTRACRAN SEL COM CAROTID INNOMINATE UNI R MOD SED 09/21/2014 [name], [facility]  . IR GENERIC HISTORICAL  09/21/2014   IR ANGIO VERTEBRAL SEL VERTEBRAL UNI L MOD SED 09/21/2014 [name], [facility]  . IR GENERIC HISTORICAL  09/21/2014   IR 3D INDEPENDENT WKST 09/21/2014 [name], [facility]  . LAPAROSCOPIC CHOLECYSTECTOMY    . TUBAL LIGATION      Family History  Problem Relation Age of Onset  . Hypertension Mother   . Arthritis Mother   . Diabetes Mother   . Dementia Mother   . Heart disease Father   . Diabetes Sister   . Diabetes Brother   . Diabetes Sister   . Cancer Neg Hx   . Colon cancer Neg Hx   . Colon polyps Neg Hx   . Esophageal cancer Neg Hx   . Rectal cancer Neg Hx   . Stomach cancer Neg Hx     Social History    Socioeconomic History  . Marital status: Widowed    Spouse name: Not on file  . Number of children: Not on file  . Years of education: Not on file  . Highest education level: Not on file  Occupational History  . Not on file  Tobacco Use  . Smoking status: Never Smoker  . Smokeless tobacco: Never Used  Vaping Use  . Vaping Use: Never used  Substance and Sexual Activity  . Alcohol use: Yes    Alcohol/week: 3.0 standard drinks    Types: 3 Standard drinks or equivalent per week    Comment: socially   . Drug use: Not Currently  . Sexual activity: Yes  Other Topics Concern  . 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 Difficulty of Paying Living Expenses: Not on file  Food Insecurity:   . Worried About Charity fundraiser in the Last Year: Not on file  . Ran Out of Food in the  Last Year: Not on file  Transportation Needs:   . Lack of Transportation (Medical): Not on file  . Lack of Transportation (Non-Medical): Not on file  Physical Activity:   . Days of Exercise per Week: Not on file  . Minutes of Exercise per Session: Not on file  Stress:   . Feeling of Stress : Not on file  Social Connections:   . Frequency of Communication with Friends and Family: Not on file  . Frequency of Social Gatherings with Friends and Family: Not on file  . Attends Religious Services: Not on file  . Active Member of Clubs or Organizations: Not on file  . Attends Archivist Meetings: Not on file  . Marital Status: Not on file  Intimate Partner Violence:   . Fear of Current or Ex-Partner: Not on file  . Emotionally Abused: Not on file  . Physically Abused: Not on file  . Sexually Abused: Not on file    Outpatient Medications Prior to Visit  Medication Sig Dispense Refill  . amLODipine (NORVASC)  10 MG tablet Take 1 tablet (10 mg total) by mouth daily. 90 tablet 3  . aspirin EC 81 MG tablet Take 1 tablet (81 mg total) by mouth daily. 90 tablet 3  . atorvastatin (LIPITOR) 20 MG tablet Take 1 tablet (20 mg total) by mouth daily. 90 tablet 1  . carvedilol (COREG) 3.125 MG tablet Take 3.125 mg by mouth 2 (two) times daily.    [name] losartan-hydrochlorothiazide (HYZAAR) 100-25 MG tablet Take 1 tablet by mouth daily. 90 tablet 3  . meclizine (ANTIVERT) 25 MG tablet Take 1 tablet (25 mg total) by mouth 3 (three) times daily as needed for dizziness. 30 tablet 1  . ondansetron (ZOFRAN ODT) 4 MG disintegrating tablet Take 1 tablet (4 mg total) by mouth every 8 (eight) hours as needed for nausea or vomiting. 12 tablet 0  . oxyCODONE-acetaminophen (PERCOCET) 5-325 MG tablet Take 1 tablet by mouth every 4 (four) hours as needed for severe pain. 20 tablet 0  . Sodium Sulfate-Mag Sulfate-KCl (SUTAB) [phone] MG TABS Take 1 kit by mouth as directed. Lot [id number], exp. 3/22 24 tablet 0  . feeding supplement, ENSURE ENLIVE, (ENSURE ENLIVE) LIQD Take 237 mLs by mouth 3 (three) times daily between meals. (Patient not taking: Reported on 05/02/2020) 237 mL 12   No facility-administered medications prior to visit.    No Known Allergies  ROS Review of Systems  Musculoskeletal: Positive for back pain.  All other systems reviewed and are negative.     Objective:    Physical Exam Vitals reviewed.  Constitutional:      Appearance: Normal appearance. She is normal weight.  HENT:     Head: Normocephalic.     Nose: Nose normal.  Cardiovascular:     Rate and Rhythm: Normal rate and regular rhythm.  Pulmonary:     Effort: Pulmonary effort is normal.     Breath sounds: Normal breath sounds.  Abdominal:     General: Bowel sounds are normal.     Palpations: Abdomen is soft.  Musculoskeletal:        General: Normal range of motion.     Cervical back: Normal range of motion.  Skin:    General: Skin is  warm and dry.  Neurological:     Mental Status: She is alert and oriented to person, place, and time.  Psychiatric:        Mood and Affect: Mood normal.  Behavior: Behavior normal.        Thought Content: Thought content normal.        Judgment: Judgment normal.     BP (!) 179/108 (BP Location: Left Arm, Patient Position: Sitting, Cuff Size: Small)   Pulse 83   Temp (!) 97.2 F (36.2 C) (Temporal)   Ht '5\' 2"'  (1.575 m)   Wt 115 lb 6.4 oz (52.3 kg)   SpO2 97%   BMI 21.11 kg/m  Wt Readings from Last 3 Encounters:  05/02/20 115 lb 6.4 oz (52.3 kg)  03/09/20 115 lb 3.2 oz (52.3 kg)  01/29/20 112 lb 9.6 oz (51.1 kg)     There are no preventive care reminders to display for this patient.  There are no preventive care reminders to display for this patient.  Lab Results  Component Value Date   TSH 1.900 01/29/2020   Lab Results  Component Value Date   WBC 11.5 (H) 08/29/2019   HGB 13.4 08/29/2019   HCT 41.4 08/29/2019   MCV 86.4 08/29/2019   PLT 324 08/29/2019   Lab Results  Component Value Date   NA 140 08/29/2019   K 3.8 08/29/2019   CO2 28 08/29/2019   GLUCOSE 117 (H) 08/29/2019   BUN 13 08/29/2019   CREATININE 0.70 08/29/2019   BILITOT 0.6 08/29/2019   ALKPHOS 76 08/29/2019   AST 16 08/29/2019   ALT 15 08/29/2019   PROT 7.4 08/29/2019   ALBUMIN 4.2 08/29/2019   CALCIUM 10.0 08/29/2019   ANIONGAP 9 08/29/2019   Lab Results  Component Value Date   CHOL 238 (H) 10/05/2019   Lab Results  Component Value Date   HDL 71 10/05/2019   Lab Results  Component Value Date   LDLCALC 154 (H) 10/05/2019   Lab Results  Component Value Date   TRIG 74 10/05/2019   Lab Results  Component Value Date   CHOLHDL 3.4 10/05/2019   Lab Results  Component Value Date   HGBA1C 5.8 (A) 09/07/2019      Assessment & Plan:  Kjirsten was seen today for back pain.  Diagnoses and all orders for this visit:  Essential hypertension Elevated secondary to  excruciating lower back pain.  Continue to take amlodipine 10 mg daily and Hyzaar 100/25 daily will reevaluate blood pressure on follow-up visit  Need for immunization against influenza -     Flu Vaccine QUAD 36+ mos IM  Acute bilateral low back pain, unspecified whether sciatica present BACK PAIN  Location: lumbar/sacral Quality: aching, sharp and throbbing Onset: rapid Worse with: bending and standing       Better with: none  Radiation: no Trauma:yes bathing mother  Best sitting/standing/leaning forward: yes  Red Flags Fecal/urinary incontinence: no  Numbness/Weakness: no  Fever/chills/sweats: no  Night pain: yes  Unexplained weight loss: no  No relief with bedrest: no  h/o cancer/immunosuppression: no  IV drug use: no  PMH of osteoporosis or chronic steroid use: no  -     ketorolac (TORADOL) injection 60 mg -     tizanidine (ZANAFLEX) 2 MG capsule; Take 1 capsule (2 mg total) by mouth 3 (three) times daily as needed for muscle spasms. -     ibuprofen (ADVIL) 600 MG tablet; Take 1 tablet (600 mg total) by mouth every 8 (eight) hours as needed.   Meds ordered this encounter  Medications  . ketorolac (TORADOL) injection 60 mg    51m to equal 30 mg  . tizanidine (ZANAFLEX) 2 MG capsule  Sig: Take 1 capsule (2 mg total) by mouth 3 (three) times daily as needed for muscle spasms.    Dispense:  60 capsule    Refill:  1  . ibuprofen (ADVIL) 600 MG tablet    Sig: Take 1 tablet (600 mg total) by mouth every 8 (eight) hours as needed.    Dispense:  60 tablet    Refill:  1    Follow-up: Return in about 4 weeks (around 05/30/2020) for Bp reck, back and fasting labs.    [name], NP

## 2020-05-02 NOTE — Patient Instructions (Signed)

## 2020-05-03 ENCOUNTER — Other Ambulatory Visit (INDEPENDENT_AMBULATORY_CARE_PROVIDER_SITE_OTHER): Payer: Self-pay | Admitting: Primary Care

## 2020-05-03 DIAGNOSIS — M545 Low back pain, unspecified: Secondary | ICD-10-CM

## 2020-05-03 NOTE — Telephone Encounter (Signed)
Medication Refill - Medication: tizanidine (ZANAFLEX) 2 MG capsule     Preferred Pharmacy (with phone number or street name):  [place] ([place]), [place] - [address] Phone:  [phone]  Fax:  [phone]       Agent: Please be advised that RX refills may take up to 3 business days. We ask that you follow-up with your pharmacy.

## 2020-05-03 NOTE — Telephone Encounter (Signed)
Requested medication (s) are due for refill today: yes  Requested medication (s) are on the active medication list: yes  Last refill:  05/02/20  Future visit scheduled: yes  Notes to clinic:  not delegated    Requested Prescriptions  Pending Prescriptions Disp Refills   tizanidine (ZANAFLEX) 2 MG capsule 60 capsule 1    Sig: Take 1 capsule (2 mg total) by mouth 3 (three) times daily as needed for muscle spasms.      Not Delegated - Cardiovascular:  Alpha-2 [facility]on[facility] - tizanidine Failed - 05/03/2020  2:52 PM      Failed - This refill cannot be delegated      Passed - Valid encounter within last 6 months    Recent Outpatient Visits           Yesterday Essential hypertension   [place], [place], NP   1 month ago Screening for STD (sexually transmitted disease)   [place], Michelle P, NP   3 months ago Essential hypertension   [place], Michelle P, NP   7 months ago Breast cancer screening by mammogram   [place], Michelle P, NP   7 months ago Essential hypertension   [place] [name], NP       Future Appointments             In 3 weeks Oletta Lamas [place] Cage, NP [facility] [facility]

## 2020-05-05 ENCOUNTER — Telehealth (INDEPENDENT_AMBULATORY_CARE_PROVIDER_SITE_OTHER): Payer: Self-pay | Admitting: Primary Care

## 2020-05-05 ENCOUNTER — Other Ambulatory Visit (INDEPENDENT_AMBULATORY_CARE_PROVIDER_SITE_OTHER): Payer: Self-pay | Admitting: Primary Care

## 2020-05-05 DIAGNOSIS — M545 Low back pain, unspecified: Secondary | ICD-10-CM

## 2020-05-05 MED ORDER — TIZANIDINE HCL 2 MG PO TABS: 2.0000 mg | ORAL_TABLET | Freq: Four times a day (QID) | ORAL | 1 refills | Status: DC | PRN

## 2020-05-05 NOTE — Telephone Encounter (Signed)
Copied from [place] [id number]. Topic: General - Inquiry >> May 05, 2020 11:37 AM Gillis Ends D wrote: Reason for CRM: Patient called and asked if the doctor could call her in a prescription for Tizanidine 2mg  tablets instead of 2mg  capsules. Her insurance doesn't cover capsules. She can be reached at [phone]. Please advise

## 2020-05-05 NOTE — Telephone Encounter (Signed)
1) Medication(s) Requested (by name): tiZANidine (ZANAFLEX) 2 MG tablet   Patient is adamant for *TABLET not CAPSULE* tablet is cheaper.   2) Pharmacy of Choice: [facility] ([place]), [place] - [address] (Ph: [phone])  3) Special Requests:   Approved medications will be sent to the pharmacy, we will reach out if there is an issue.  Requests made after 3pm may not be addressed until the following business day!  If a patient is unsure of the name of the medication(s) please note and ask patient to call back when they are able to provide all info, do not send to responsible party until all information is available!

## 2020-05-10 ENCOUNTER — Encounter: Payer: Self-pay | Admitting: Podiatry

## 2020-05-24 ENCOUNTER — Ambulatory Visit (INDEPENDENT_AMBULATORY_CARE_PROVIDER_SITE_OTHER): Payer: Medicare Other | Admitting: Podiatry

## 2020-05-24 ENCOUNTER — Other Ambulatory Visit: Payer: Self-pay

## 2020-05-24 DIAGNOSIS — M2042 Other hammer toe(s) (acquired), left foot: Secondary | ICD-10-CM | POA: Diagnosis not present

## 2020-05-24 DIAGNOSIS — Z9889 Other specified postprocedural states: Secondary | ICD-10-CM | POA: Diagnosis not present

## 2020-05-24 DIAGNOSIS — M2012 Hallux valgus (acquired), left foot: Secondary | ICD-10-CM

## 2020-05-24 NOTE — Progress Notes (Signed)
  Subjective:  Patient ID: [name], female    DOB: Mar 11, 1956,  MRN: [id number]  Chief Complaint  Patient presents with  . Bunions    2 month follow-up, wearing a normal shoe, increase ROM reported since last visit. No concerns voiced    DOS: 12/09/19 Procedure: [place] bunionectomy left, hammertoe corrections 2,3,4,5 left   64 y.o. female presents with the above complaint. History confirmed with patient.  Doing very well no pain or issues with her foot wearing normal shoe gear without issues pleased with her surgical result.  Would like to get the right foot corrected at some point but would like to wait a few months. Objective:  Physical Exam: Toes rectus, good range of motion first MPJ with only mild end range of motion restriction and dorsiflexion.  No pain to palpation Incision: Fully healed Assessment:   1. Hammertoe of left foot   2. Post-operative state   3. Hallux valgus, left     Plan:  Patient was evaluated and treated and all questions answered.  Post-operative State -Doing very well only mild ROM restriction.  Encouraged to continue range of motion exercises. -At this point will clear for discharge with follow-up only as needed for left foot.  She likely would benefit from right foot surgical correction however will hold off a few months per patient preference.  Patient to follow-up when she is ready  Return if symptoms worsen or fail to improve.

## 2020-05-27 ENCOUNTER — Encounter (INDEPENDENT_AMBULATORY_CARE_PROVIDER_SITE_OTHER): Payer: Self-pay | Admitting: Primary Care

## 2020-05-27 ENCOUNTER — Ambulatory Visit (INDEPENDENT_AMBULATORY_CARE_PROVIDER_SITE_OTHER): Payer: Medicare Other | Admitting: Primary Care

## 2020-05-27 ENCOUNTER — Other Ambulatory Visit: Payer: Self-pay

## 2020-05-27 VITALS — BP 156/93 | HR 79 | Ht 62.0 in | Wt 114.0 lb

## 2020-05-27 DIAGNOSIS — I10 Essential (primary) hypertension: Secondary | ICD-10-CM

## 2020-05-27 DIAGNOSIS — E782 Mixed hyperlipidemia: Secondary | ICD-10-CM | POA: Diagnosis not present

## 2020-05-27 DIAGNOSIS — R42 Dizziness and giddiness: Secondary | ICD-10-CM | POA: Diagnosis not present

## 2020-05-27 DIAGNOSIS — R7303 Prediabetes: Secondary | ICD-10-CM

## 2020-05-27 DIAGNOSIS — I671 Cerebral aneurysm, nonruptured: Secondary | ICD-10-CM | POA: Diagnosis not present

## 2020-05-27 NOTE — Progress Notes (Signed)
Has been having some trouble with balance.  Fell in parking lot at store now having pain in left middle finger.

## 2020-05-27 NOTE — Patient Instructions (Signed)

## 2020-05-27 NOTE — Progress Notes (Signed)
Acute Office Visit  Subjective:    Patient ID: [name], female    DOB: January 07, 1956, 64 y.o.   MRN: [id number]  Chief Complaint  Patient presents with  . Hypertension    HPI [name] is a 64 year old female initial visit was for hypertension.  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  She admits to taking her blood pressure daily.  She denies shortness of breath, chest pain, headache but she has been experiencing equilibrium problems when she stands up she goes backwards when she is trying to move she goes for feels as though she is off balance no falls as a result from this.   Past Medical History:  Diagnosis Date  . Anemia    hx  . Arthritis    "all over" (06/19/2017)  . Diabetes mellitus without complication (HCC)    diet control  . Hyperlipidemia   . Hypertension   . TIA (transient ischemic attack)    pt unaware of this hx on 06/19/2017    Past Surgical History:  Procedure Laterality Date  . ANTERIOR CERVICAL DECOMP/DISCECTOMY FUSION    . BACK SURGERY    . CESAREAN SECTION  1986  . IR GENERIC HISTORICAL  09/21/2014   IR ANGIO INTRA EXTRACRAN SEL INTERNAL CAROTID UNI L MOD SED 09/21/2014 [name], [facility]  . IR GENERIC HISTORICAL  09/21/2014   IR ANGIO INTRA EXTRACRAN SEL COM CAROTID INNOMINATE UNI R MOD SED 09/21/2014 [name], [facility]  . IR GENERIC HISTORICAL  09/21/2014   IR ANGIO VERTEBRAL SEL VERTEBRAL UNI L MOD SED 09/21/2014 [name], [facility]  . IR GENERIC HISTORICAL  09/21/2014   IR 3D INDEPENDENT WKST 09/21/2014 [name], [facility]  . LAPAROSCOPIC CHOLECYSTECTOMY    . TUBAL LIGATION      Family History  Problem Relation Age of Onset  . Hypertension Mother   . Arthritis Mother   . Diabetes Mother   . Dementia Mother   . Heart disease Father   .  Diabetes Sister   . Diabetes Brother   . Diabetes Sister   . Cancer Neg Hx   . Colon cancer Neg Hx   . Colon polyps Neg Hx   . Esophageal cancer Neg Hx   . Rectal cancer Neg Hx   . Stomach cancer Neg Hx     Social History   Socioeconomic History  . Marital status: Widowed    Spouse name: Not on file  . Number of children: Not on file  . Years of education: Not on file  . Highest education level: Not on file  Occupational History  . Not on file  Tobacco Use  . Smoking status: Never Smoker  . Smokeless tobacco: Never Used  Vaping Use  . Vaping Use: Never used  Substance and Sexual Activity  . Alcohol use: Yes    Alcohol/week: 3.0 standard drinks    Types: 3 Standard drinks or equivalent per week    Comment: socially   . Drug use: Not Currently  . Sexual activity: Yes  Other Topics Concern  . 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 Difficulty of Paying Living Expenses: Not on file  Food Insecurity:   . Worried About Charity fundraiser in the Last Year: Not on file  . Ran Out of Food in the Last Year: Not on file  Transportation Needs:   . Lack of Transportation (Medical): Not on file  . Lack of Transportation (Non-Medical): Not on file  Physical Activity:   . Days of Exercise per Week: Not on file  . Minutes of Exercise per Session: Not on file  Stress:   . Feeling of Stress : Not on file  Social Connections:   . Frequency of Communication with Friends and Family: Not on file  . Frequency of Social Gatherings with Friends and Family: Not on file  . Attends Religious Services: Not on file  . Active Member of Clubs or Organizations: Not on file  . Attends Archivist Meetings: Not on file  . Marital Status: Not on file  Intimate Partner Violence:   . Fear of Current  or Ex-Partner: Not on file  . Emotionally Abused: Not on file  . Physically Abused: Not on file  . Sexually Abused: Not on file    Outpatient Medications Prior to Visit  Medication Sig Dispense Refill  . amLODipine (NORVASC) 10 MG tablet Take 1 tablet (10 mg total) by mouth daily. 90 tablet 3  . aspirin EC 81 MG tablet Take 1 tablet (81 mg total) by mouth daily. 90 tablet 3  . atorvastatin (LIPITOR) 20 MG tablet Take 1 tablet (20 mg total) by mouth daily. 90 tablet 1  . carvedilol (COREG) 3.125 MG tablet Take 3.125 mg by mouth 2 (two) times daily.    [name] ibuprofen (ADVIL) 600 MG tablet Take 1 tablet (600 mg total) by mouth every 8 (eight) hours as needed. 60 tablet 1  . losartan-hydrochlorothiazide (HYZAAR) 100-25 MG tablet Take 1 tablet by mouth daily. 90 tablet 3  . meclizine (ANTIVERT) 25 MG tablet Take 1 tablet (25 mg total) by mouth 3 (three) times daily as needed for dizziness. 30 tablet 1  . ondansetron (ZOFRAN ODT) 4 MG disintegrating tablet Take 1 tablet (4 mg total) by mouth every 8 (eight) hours as needed for nausea or vomiting. 12 tablet 0  . Sodium Sulfate-Mag Sulfate-KCl (SUTAB) [phone] MG TABS Take 1 kit by mouth as directed. Lot [id number], exp. 3/22 24 tablet 0  . tiZANidine (ZANAFLEX) 2 MG tablet Take 1 tablet (2 mg total) by mouth every 6 (six) hours as needed for muscle spasms. 60 tablet 1  . feeding supplement, ENSURE ENLIVE, (ENSURE ENLIVE) LIQD Take 237 mLs by mouth 3 (three) times daily between meals. (Patient not taking: Reported on 05/02/2020) 237 mL 12  . oxyCODONE-acetaminophen (PERCOCET) 5-325 MG tablet Take 1 tablet by mouth every 4 (four) hours as needed for severe pain. (Patient not taking: Reported on 05/27/2020) 20 tablet 0   No facility-administered medications prior to visit.    No Known Allergies  Review of Systems  Musculoskeletal: Positive for gait problem and neck stiffness.  Neurological: Positive for dizziness.  All other systems reviewed and are  negative.      Objective:    Physical Exam Vitals reviewed.  Constitutional:      Appearance: She is normal weight.  HENT:     Head: Normocephalic.     Nose: Nose normal.  Eyes:     Extraocular Movements: Extraocular movements intact.  Cardiovascular:     Rate and Rhythm: Normal rate  and regular rhythm.  Pulmonary:     Effort: Pulmonary effort is normal.     Breath sounds: Normal breath sounds.  Abdominal:     General: Bowel sounds are normal.     Palpations: Abdomen is soft.     Tenderness: There is abdominal tenderness.     Hernia: A hernia is present.     Comments: Umbilical   Musculoskeletal:        General: Normal range of motion.     Cervical back: Normal range of motion. Rigidity present.  Skin:    General: Skin is warm and dry.  Neurological:     Mental Status: She is alert and oriented to person, place, and time.  Psychiatric:        Mood and Affect: Mood normal.        Thought Content: Thought content normal.        Judgment: Judgment normal.     BP (!) 156/93   Pulse 79   Ht '5\' 2"'  (1.575 m)   Wt 114 lb (51.7 kg)   SpO2 100%   BMI 20.85 kg/m  Wt Readings from Last 3 Encounters:  05/27/20 114 lb (51.7 kg)  05/02/20 115 lb 6.4 oz (52.3 kg)  03/09/20 115 lb 3.2 oz (52.3 kg)    There are no preventive care reminders to display for this patient.  There are no preventive care reminders to display for this patient.   Lab Results  Component Value Date   TSH 1.900 01/29/2020   Lab Results  Component Value Date   WBC 11.5 (H) 08/29/2019   HGB 13.4 08/29/2019   HCT 41.4 08/29/2019   MCV 86.4 08/29/2019   PLT 324 08/29/2019   Lab Results  Component Value Date   NA 140 08/29/2019   K 3.8 08/29/2019   CO2 28 08/29/2019   GLUCOSE 117 (H) 08/29/2019   BUN 13 08/29/2019   CREATININE 0.70 08/29/2019   BILITOT 0.6 08/29/2019   ALKPHOS 76 08/29/2019   AST 16 08/29/2019   ALT 15 08/29/2019   PROT 7.4 08/29/2019   ALBUMIN 4.2 08/29/2019    CALCIUM 10.0 08/29/2019   ANIONGAP 9 08/29/2019   Lab Results  Component Value Date   CHOL 238 (H) 10/05/2019   Lab Results  Component Value Date   HDL 71 10/05/2019   Lab Results  Component Value Date   LDLCALC 154 (H) 10/05/2019   Lab Results  Component Value Date   TRIG 74 10/05/2019   Lab Results  Component Value Date   CHOLHDL 3.4 10/05/2019   Lab Results  Component Value Date   HGBA1C 5.8 (A) 09/07/2019       Assessment & Plan:  Sheranda was seen today for hypertension.  Diagnoses and all orders for this visit:  Dizziness and giddiness -     Ambulatory referral to Neurology -     Lipid Panel  Cerebral aneurysm without rupture Hx of refer to neurology for dizziness no syncope episodes  -     CBC with Differential r/o anemia  -     CMP14+EGFR -     Lipid Panel  Essential hypertension Counseled on blood pressure goal of less than 130/80, low-sodium, DASH diet, medication compliance, 150 minutes of moderate intensity exercise per week.Remains elevated admits taking medication regularly.  -     CMP14+EGFR  Mixed hyperlipidemia Currently prescribed atorvastatin without complications  -     Lipid Panel  Prediabetes -     HgB  A1c     No orders of the defined types were placed in this encounter.    [name], NP

## 2020-05-28 LAB — CMP14+EGFR
ALT: 12 IU/L (ref 0–32)
AST: 17 IU/L (ref 0–40)
Albumin/Globulin Ratio: 1.6 (ref 1.2–2.2)
Albumin: 4.6 g/dL (ref 3.8–4.8)
Alkaline Phosphatase: 115 IU/L (ref 44–121)
BUN/Creatinine Ratio: 19 (ref 12–28)
BUN: 12 mg/dL (ref 8–27)
Bilirubin Total: 0.2 mg/dL (ref 0.0–1.2)
CO2: 25 mmol/L (ref 20–29)
Calcium: 9.5 mg/dL (ref 8.7–10.3)
Chloride: 104 mmol/L (ref 96–106)
Creatinine, Ser: 0.63 mg/dL (ref 0.57–1.00)
GFR calc Af Amer: 109 mL/min/{1.73_m2} (ref >59)
GFR calc non Af Amer: 94 mL/min/{1.73_m2} (ref >59)
Globulin, Total: 2.8 g/dL (ref 1.5–4.5)
Glucose: 122 mg/dL — ABNORMAL HIGH (ref 65–99)
Potassium: 3.7 mmol/L (ref 3.5–5.2)
Sodium: 143 mmol/L (ref 134–144)
Total Protein: 7.4 g/dL (ref 6.0–8.5)

## 2020-05-28 LAB — LIPID PANEL
Chol/HDL Ratio: 2.7 ratio (ref 0.0–4.4)
Cholesterol, Total: 199 mg/dL (ref 100–199)
HDL: 73 mg/dL (ref >39)
LDL Chol Calc (NIH): 114 mg/dL — ABNORMAL HIGH (ref 0–99)
Triglycerides: 68 mg/dL (ref 0–149)
VLDL Cholesterol Cal: 12 mg/dL (ref 5–40)

## 2020-05-28 LAB — CBC WITH DIFFERENTIAL/PLATELET
Basophils Absolute: 0.1 10*3/uL (ref 0.0–0.2)
Basos: 1 %
EOS (ABSOLUTE): 0.2 10*3/uL (ref 0.0–0.4)
Eos: 2 %
Hematocrit: 35 % (ref 34.0–46.6)
Hemoglobin: 11.5 g/dL (ref 11.1–15.9)
Immature Grans (Abs): 0 10*3/uL (ref 0.0–0.1)
Immature Granulocytes: 0 %
Lymphocytes Absolute: 2.8 10*3/uL (ref 0.7–3.1)
Lymphs: 29 %
MCH: 27.4 pg (ref 26.6–33.0)
MCHC: 32.9 g/dL (ref 31.5–35.7)
MCV: 84 fL (ref 79–97)
Monocytes Absolute: 0.4 10*3/uL (ref 0.1–0.9)
Monocytes: 4 %
Neutrophils Absolute: 6.3 10*3/uL (ref 1.4–7.0)
Neutrophils: 64 %
Platelets: 324 10*3/uL (ref 150–450)
RBC: 4.19 x10E6/uL (ref 3.77–5.28)
RDW: 13.1 % (ref 11.7–15.4)
WBC: 9.8 10*3/uL (ref 3.4–10.8)

## 2020-05-31 ENCOUNTER — Other Ambulatory Visit: Payer: Self-pay

## 2020-05-31 ENCOUNTER — Encounter: Payer: Self-pay | Admitting: Diagnostic Neuroimaging

## 2020-05-31 ENCOUNTER — Ambulatory Visit: Payer: Medicare Other | Admitting: Diagnostic Neuroimaging

## 2020-05-31 VITALS — BP 143/89 | HR 83 | Ht 62.0 in | Wt 115.2 lb

## 2020-05-31 DIAGNOSIS — R42 Dizziness and giddiness: Secondary | ICD-10-CM | POA: Diagnosis not present

## 2020-05-31 NOTE — Progress Notes (Signed)
[facility]  PATIENT: [name] DOB: Feb 15, 1956  REFERRING CLINICIAN: [name], NP HISTORY FROM: patient  REASON FOR VISIT: new consult    HISTORICAL  CHIEF COMPLAINT:  Chief Complaint  Patient presents with  . Dizziness and Giddiness    rm 6, New Pt "feel off balance not dizzy; comes on all of a sudden when standing"    HISTORY OF PRESENT ILLNESS:   64 year old female with dizziness and off-balance sensation.  Symptoms started 2 to 3 years ago, and then resolved.  In the past 1 month symptoms have returned.  Sometimes she feels like she is veering to the left side.  Sometimes she feels lightheaded and spinning sensation.  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  Patient also has 78 year old daughter with developmental delay and disability.    REVIEW OF SYSTEMS: Full 14 system review of systems performed and negative with exception of: As per HPI.  ALLERGIES: No Known Allergies  HOME MEDICATIONS: Outpatient Medications Prior to Visit  Medication Sig Dispense Refill  . amLODipine (NORVASC) 10 MG tablet Take 1 tablet (10 mg total) by mouth daily. 90 tablet 3  . aspirin EC 81 MG tablet Take 1 tablet (81 mg total) by mouth daily. 90 tablet 3  . atorvastatin (LIPITOR) 20 MG tablet Take 1 tablet (20 mg total) by mouth daily. 90 tablet 1  . ibuprofen (ADVIL) 600 MG tablet Take 1 tablet (600 mg total) by mouth every 8 (eight) hours as needed. 60 tablet 1  . losartan-hydrochlorothiazide (HYZAAR) 100-25 MG tablet Take 1 tablet by mouth daily. 90 tablet 3  . tiZANidine (ZANAFLEX) 2 MG tablet Take 1 tablet (2 mg total) by mouth every 6 (six) hours as needed for muscle spasms. 60 tablet 1  . feeding supplement, ENSURE ENLIVE, (ENSURE ENLIVE) LIQD Take 237 mLs by mouth 3 (three) times daily between meals. 237 mL 12  . carvedilol (COREG) 3.125 MG tablet Take 3.125 mg by  mouth 2 (two) times daily. (Patient not taking: Reported on 05/31/2020)    . meclizine (ANTIVERT) 25 MG tablet Take 1 tablet (25 mg total) by mouth 3 (three) times daily as needed for dizziness. (Patient not taking: Reported on 05/31/2020) 30 tablet 1  . ondansetron (ZOFRAN ODT) 4 MG disintegrating tablet Take 1 tablet (4 mg total) by mouth every 8 (eight) hours as needed for nausea or vomiting. (Patient not taking: Reported on 05/31/2020) 12 tablet 0  . oxyCODONE-acetaminophen (PERCOCET) 5-325 MG tablet Take 1 tablet by mouth every 4 (four) hours as needed for severe pain. (Patient not taking: Reported on 05/27/2020) 20 tablet 0  . Sodium Sulfate-Mag Sulfate-KCl (SUTAB) [phone] MG TABS Take 1 kit by mouth as directed. Lot [id number], exp. 3/22 24 tablet 0   No facility-administered medications prior to visit.    PAST MEDICAL HISTORY: Past Medical History:  Diagnosis Date  . Anemia    hx  . 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 Arthritis    "all over" (06/19/2017)  . Diabetes mellitus without complication (HCC)    diet control, pt denies  . Hyperlipidemia   . Hypertension   . TIA (transient ischemic attack)    pt unaware of this hx on 06/19/2017    PAST SURGICAL HISTORY: Past Surgical History:  Procedure Laterality Date  . ANTERIOR CERVICAL DECOMP/DISCECTOMY FUSION    . BACK SURGERY    .  CESAREAN SECTION  1986  . FOOT SURGERY Left 11/2019  . IR GENERIC HISTORICAL  09/21/2014   IR ANGIO INTRA EXTRACRAN SEL INTERNAL CAROTID UNI L MOD SED 09/21/2014 [name], MD [facility]  . IR GENERIC HISTORICAL  09/21/2014   IR ANGIO INTRA EXTRACRAN SEL COM CAROTID INNOMINATE UNI R MOD SED 09/21/2014 [name], MD [facility]  . IR GENERIC HISTORICAL  09/21/2014   IR ANGIO VERTEBRAL SEL VERTEBRAL UNI L MOD SED 09/21/2014 [name], MD [facility]  . IR GENERIC HISTORICAL  09/21/2014   IR 3D INDEPENDENT WKST 09/21/2014 [name], MD [facility]  .  LAPAROSCOPIC CHOLECYSTECTOMY    . TUBAL LIGATION      FAMILY HISTORY: Family History  Problem Relation Age of Onset  . Hypertension Mother   . Arthritis Mother   . Diabetes Mother   . Dementia Mother   . Heart disease Father   . Diabetes Sister   . Diabetes Brother   . Diabetes Sister   . Cancer Neg Hx   . Colon cancer Neg Hx   . Colon polyps Neg Hx   . Esophageal cancer Neg Hx   . Rectal cancer Neg Hx   . Stomach cancer Neg Hx     SOCIAL HISTORY: Social History   Socioeconomic History  . Marital status: Widowed    Spouse name: Not on file  . Number of children: 1  . Years of education: Not on file  . Highest education level: Some college, no degree  Occupational History    Comment: caregiver of dementia patient  Tobacco Use  . Smoking status: Never Smoker  . Smokeless tobacco: Never Used  Vaping Use  . Vaping Use: Never used  Substance and Sexual Activity  . Alcohol use: Yes    Alcohol/week: 3.0 standard drinks    Types: 3 Standard drinks or equivalent per week    Comment: socially   . Drug use: Not Currently  . Sexual activity: Yes  Other Topics Concern  . 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 Difficulty of Paying Living Expenses: Not on file  Food Insecurity:   . Worried About Charity fundraiser in the Last Year: Not on file  . Ran Out of Food in the Last Year: Not on file  Transportation Needs:   . Lack of Transportation (Medical): Not on file  . Lack of Transportation (Non-Medical): Not on file  Physical Activity:   . Days of Exercise per Week: Not on file  . Minutes of Exercise per Session: Not on file  Stress:   . Feeling of Stress : Not on file  Social Connections:   . Frequency of Communication with Friends and Family: Not on file  . Frequency  of Social Gatherings with Friends and Family: Not on file  . Attends Religious Services: Not on file  . Active Member of Clubs or Organizations: Not on file  . Attends Archivist Meetings: Not on file  . Marital Status: Not on file  Intimate Partner Violence:   . Fear of Current or Ex-Partner: Not on file  . Emotionally Abused: Not on file  . Physically Abused: Not on file  . Sexually Abused: Not on file     PHYSICAL  EXAM  GENERAL EXAM/CONSTITUTIONAL: Vitals:  Vitals:   05/31/20 1031  BP: (!) 143/89  Pulse: 83  Weight: 115 lb 3.2 oz (52.3 kg)  Height: '5\' 2"'  (1.575 m)     Body mass index is 21.07 kg/m. Wt Readings from Last 3 Encounters:  05/31/20 115 lb 3.2 oz (52.3 kg)  05/27/20 114 lb (51.7 kg)  05/02/20 115 lb 6.4 oz (52.3 kg)     Patient is in no distress; well developed, nourished and groomed; neck is supple  CARDIOVASCULAR:  Examination of carotid arteries is normal; no carotid bruits  Regular rate and rhythm, no murmurs  Examination of peripheral vascular system by observation and palpation is normal  EYES:  Ophthalmoscopic exam of optic discs and posterior segments is normal; no papilledema or hemorrhages  No exam data present  MUSCULOSKELETAL:  Gait, strength, tone, movements noted in Neurologic exam below  NEUROLOGIC: MENTAL STATUS:  No flowsheet data found.  awake, alert, oriented to person, place and time  recent and remote memory intact  normal attention and concentration  language fluent, comprehension intact, naming intact  fund of knowledge appropriate  CRANIAL NERVE:   2nd - no papilledema on fundoscopic exam  2nd, 3rd, 4th, 6th - pupils equal and reactive to light, visual fields full to confrontation, extraocular muscles intact, no nystagmus  5th - facial sensation symmetric  7th - facial strength symmetric  8th - hearing intact  9th - palate elevates symmetrically, uvula midline  11th - shoulder shrug  symmetric  12th - tongue protrusion midline  MOTOR:   normal bulk and tone, full strength in the BUE, BLE  SENSORY:   normal and symmetric to light touch, temperature, vibration  COORDINATION:   finger-nose-finger, fine finger movements normal  REFLEXES:   deep tendon reflexes present and symmetric  GAIT/STATION:   narrow based gait     DIAGNOSTIC DATA (LABS, IMAGING, TESTING) - I reviewed patient records, labs, notes, testing and imaging myself where available.  Lab Results  Component Value Date   WBC 9.8 05/27/2020   HGB 11.5 05/27/2020   HCT 35.0 05/27/2020   MCV 84 05/27/2020   PLT 324 05/27/2020      Component Value Date/Time   NA 143 05/27/2020 1018   K 3.7 05/27/2020 1018   CL 104 05/27/2020 1018   CO2 25 05/27/2020 1018   GLUCOSE 122 (H) 05/27/2020 1018   GLUCOSE 117 (H) 08/29/2019 1422   BUN 12 05/27/2020 1018   CREATININE 0.63 05/27/2020 1018   CREATININE 0.72 02/24/2013 1721   CALCIUM 9.5 05/27/2020 1018   PROT 7.4 05/27/2020 1018   ALBUMIN 4.6 05/27/2020 1018   AST 17 05/27/2020 1018   ALT 12 05/27/2020 1018   ALKPHOS 115 05/27/2020 1018   BILITOT 0.2 05/27/2020 1018   GFRNONAA 94 05/27/2020 1018   GFRAA 109 05/27/2020 1018   Lab Results  Component Value Date   CHOL 199 05/27/2020   HDL 73 05/27/2020   LDLCALC 114 (H) 05/27/2020   TRIG 68 05/27/2020   CHOLHDL 2.7 05/27/2020   Lab Results  Component Value Date   HGBA1C 5.8 (A) 09/07/2019   No results found for: [id number] Lab Results  Component Value Date   TSH 1.900 01/29/2020    12/04/17 MRI brain 1. No acute intracranial process. 2. Increased moderate white matter changes most compatible with chronic small vessel ischemic disease. 3. Mild parenchymal brain volume loss.  12/04/17 CTA head 1. No evidence of acute intracranial abnormality. 2. Patent circle  of Willis without major branch occlusion or significant stenosis. 3. 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    ASSESSMENT AND PLAN  63 y.o. year old female here with:  Dx:  1. Dizziness     PLAN:  INTERMITTENT LIGHTHEADEDNESS / DIZZINESS / GAIT DIFF (since 2019; poss related to caregiver stress, decreased PO intake, decrease sleep; neuro exam and MRI unremarkable) - optimize nutrition, exercise, stress mgmt (esp caregiver stress) - daily physical activity / exercise (at least 15-30 minutes) - eat more plants / vegetables - increase social activities, brain stimulation, games, puzzles, hobbies, crafts, arts, music - aim for at least 7-8 hours sleep per night (or more) - avoid smoking and alcohol  Orders Placed This Encounter  Procedures  . Vitamin B12   Return for return to PCP, pending if symptoms worsen or fail to improve.    Penni Bombard, MD 19/06/6604, 00:45 AM Certified in Neurology, Neurophysiology and Neuroimaging  [facility] [address] [place], [place] [address] [phone]

## 2020-05-31 NOTE — Patient Instructions (Signed)
INTERMITTENT LIGHTHEADEDNESS / DIZZINESS / GAIT DIFF - optimize nutrition, exercise, stress mgmt (esp caregiver stress) - daily physical activity / exercise (at least 15-30 minutes) - eat more plants / vegetables - increase social activities, brain stimulation, games, puzzles, hobbies, crafts, arts, music - aim for at least 7-8 hours sleep per night (or more) - avoid smoking and alcohol

## 2020-06-01 LAB — VITAMIN B12: Vitamin B-12: 388 pg/mL (ref 232–1245)

## 2020-06-02 ENCOUNTER — Encounter: Payer: Self-pay | Admitting: *Deleted

## 2020-07-04 ENCOUNTER — Telehealth: Payer: Self-pay | Admitting: Diagnostic Neuroimaging

## 2020-07-04 NOTE — Telephone Encounter (Signed)
[name] - This pt was here 12/7 and her check out notes stated to see PCP if worsen. She is still having issues and wants to come back to Dr. Leta Baptist because she is continuing to be off balance and feels there is something wrong. She is unable to get into the PCP until March.  Thank you

## 2020-07-05 NOTE — Telephone Encounter (Signed)
LVM advising patient she can call and phone staff can schedule follow up with Dr Leta Baptist or Debbora Presto NP. Advised they have some openings in Feb.

## 2020-07-26 ENCOUNTER — Encounter: Payer: Self-pay | Admitting: Diagnostic Neuroimaging

## 2020-07-26 ENCOUNTER — Ambulatory Visit: Payer: Medicare Other | Admitting: Diagnostic Neuroimaging

## 2020-07-26 ENCOUNTER — Telehealth: Payer: Self-pay | Admitting: *Deleted

## 2020-07-26 NOTE — Telephone Encounter (Signed)
Patient was no show for follow up today. 

## 2020-08-26 ENCOUNTER — Ambulatory Visit (INDEPENDENT_AMBULATORY_CARE_PROVIDER_SITE_OTHER): Payer: Medicare Other | Admitting: Primary Care

## 2020-09-16 ENCOUNTER — Ambulatory Visit (INDEPENDENT_AMBULATORY_CARE_PROVIDER_SITE_OTHER): Payer: Medicare HMO | Admitting: Primary Care

## 2020-10-09 ENCOUNTER — Emergency Department (HOSPITAL_COMMUNITY): Payer: Medicare HMO

## 2020-10-09 ENCOUNTER — Inpatient Hospital Stay (HOSPITAL_COMMUNITY)
Admission: EM | Admit: 2020-10-09 | Discharge: 2020-10-13 | DRG: 392 | Disposition: A | Discharge status: Home / Self Care | Payer: Medicare HMO | Attending: Internal Medicine | Admitting: Internal Medicine

## 2020-10-09 ENCOUNTER — Encounter (HOSPITAL_COMMUNITY): Payer: Self-pay

## 2020-10-09 ENCOUNTER — Other Ambulatory Visit: Payer: Self-pay

## 2020-10-09 DIAGNOSIS — E876 Hypokalemia: Secondary | ICD-10-CM | POA: Diagnosis not present

## 2020-10-09 DIAGNOSIS — I158 Other secondary hypertension: Secondary | ICD-10-CM | POA: Diagnosis present

## 2020-10-09 DIAGNOSIS — E119 Type 2 diabetes mellitus without complications: Secondary | ICD-10-CM | POA: Diagnosis not present

## 2020-10-09 DIAGNOSIS — Z981 Arthrodesis status: Secondary | ICD-10-CM

## 2020-10-09 DIAGNOSIS — Z20822 Contact with and (suspected) exposure to covid-19: Secondary | ICD-10-CM | POA: Diagnosis present

## 2020-10-09 DIAGNOSIS — Z79899 Other long term (current) drug therapy: Secondary | ICD-10-CM

## 2020-10-09 DIAGNOSIS — I10 Essential (primary) hypertension: Secondary | ICD-10-CM

## 2020-10-09 DIAGNOSIS — Z8249 Family history of ischemic heart disease and other diseases of the circulatory system: Secondary | ICD-10-CM

## 2020-10-09 DIAGNOSIS — E1165 Type 2 diabetes mellitus with hyperglycemia: Secondary | ICD-10-CM | POA: Diagnosis present

## 2020-10-09 DIAGNOSIS — A09 Infectious gastroenteritis and colitis, unspecified: Secondary | ICD-10-CM | POA: Diagnosis not present

## 2020-10-09 DIAGNOSIS — Z8673 Personal history of transient ischemic attack (TIA), and cerebral infarction without residual deficits: Secondary | ICD-10-CM

## 2020-10-09 DIAGNOSIS — E860 Dehydration: Secondary | ICD-10-CM | POA: Diagnosis present

## 2020-10-09 DIAGNOSIS — R109 Unspecified abdominal pain: Secondary | ICD-10-CM | POA: Diagnosis not present

## 2020-10-09 DIAGNOSIS — R0602 Shortness of breath: Secondary | ICD-10-CM | POA: Diagnosis not present

## 2020-10-09 DIAGNOSIS — R112 Nausea with vomiting, unspecified: Secondary | ICD-10-CM

## 2020-10-09 DIAGNOSIS — Z833 Family history of diabetes mellitus: Secondary | ICD-10-CM

## 2020-10-09 DIAGNOSIS — I160 Hypertensive urgency: Secondary | ICD-10-CM | POA: Diagnosis present

## 2020-10-09 DIAGNOSIS — E872 Acidosis: Secondary | ICD-10-CM | POA: Diagnosis present

## 2020-10-09 DIAGNOSIS — E538 Deficiency of other specified B group vitamins: Secondary | ICD-10-CM | POA: Diagnosis present

## 2020-10-09 DIAGNOSIS — A419 Sepsis, unspecified organism: Secondary | ICD-10-CM | POA: Diagnosis present

## 2020-10-09 DIAGNOSIS — E785 Hyperlipidemia, unspecified: Secondary | ICD-10-CM | POA: Diagnosis present

## 2020-10-09 DIAGNOSIS — Z7982 Long term (current) use of aspirin: Secondary | ICD-10-CM

## 2020-10-09 LAB — CBC WITH DIFFERENTIAL/PLATELET
Abs Immature Granulocytes: 0.1 10*3/uL — ABNORMAL HIGH (ref 0.00–0.07)
Basophils Absolute: 0.1 10*3/uL (ref 0.0–0.1)
Basophils Relative: 1 %
Eosinophils Absolute: 0.1 10*3/uL (ref 0.0–0.5)
Eosinophils Relative: 0 %
HCT: 42.3 % (ref 36.0–46.0)
Hemoglobin: 13.7 g/dL (ref 12.0–15.0)
Immature Granulocytes: 1 %
Lymphocytes Relative: 14 %
Lymphs Abs: 2.5 10*3/uL (ref 0.7–4.0)
MCH: 28 pg (ref 26.0–34.0)
MCHC: 32.4 g/dL (ref 30.0–36.0)
MCV: 86.5 fL (ref 80.0–100.0)
Monocytes Absolute: 0.5 10*3/uL (ref 0.1–1.0)
Monocytes Relative: 3 %
Neutro Abs: 14.1 10*3/uL — ABNORMAL HIGH (ref 1.7–7.7)
Neutrophils Relative %: 81 %
Platelets: 400 10*3/uL (ref 150–400)
RBC: 4.89 MIL/uL (ref 3.87–5.11)
RDW: 13.8 % (ref 11.5–15.5)
WBC: 17.3 10*3/uL — ABNORMAL HIGH (ref 4.0–10.5)
nRBC: 0 % (ref 0.0–0.2)

## 2020-10-09 LAB — COMPREHENSIVE METABOLIC PANEL
ALT: 14 U/L (ref 0–44)
AST: 18 U/L (ref 15–41)
Albumin: 4.4 g/dL (ref 3.5–5.0)
Alkaline Phosphatase: 102 U/L (ref 38–126)
Anion gap: 11 (ref 5–15)
BUN: 7 mg/dL — ABNORMAL LOW (ref 8–23)
CO2: 24 mmol/L (ref 22–32)
Calcium: 9.6 mg/dL (ref 8.9–10.3)
Chloride: 103 mmol/L (ref 98–111)
Creatinine, Ser: 0.68 mg/dL (ref 0.44–1.00)
GFR, Estimated: >60 mL/min (ref >60)
Glucose, Bld: 234 mg/dL — ABNORMAL HIGH (ref 70–99)
Potassium: 2.7 mmol/L — CL (ref 3.5–5.1)
Sodium: 138 mmol/L (ref 135–145)
Total Bilirubin: 0.5 mg/dL (ref 0.3–1.2)
Total Protein: 8.2 g/dL — ABNORMAL HIGH (ref 6.5–8.1)

## 2020-10-09 LAB — LACTIC ACID, PLASMA
Lactic Acid, Venous: 3.7 mmol/L (ref 0.5–1.9)
Lactic Acid, Venous: 2.7 mmol/L (ref 0.5–1.9)
Lactic Acid, Venous: 1.4 mmol/L (ref 0.5–1.9)
Lactic Acid, Venous: 1.3 mmol/L (ref 0.5–1.9)

## 2020-10-09 LAB — CBG MONITORING, ED: Glucose-Capillary: 177 mg/dL — ABNORMAL HIGH (ref 70–99)

## 2020-10-09 LAB — URINALYSIS, ROUTINE W REFLEX MICROSCOPIC
Bacteria, UA: NONE SEEN
Bilirubin Urine: NEGATIVE
Glucose, UA: >=500 mg/dL — AB
Hgb urine dipstick: NEGATIVE
Ketones, ur: NEGATIVE mg/dL
Leukocytes,Ua: NEGATIVE
Nitrite: NEGATIVE
Protein, ur: 100 mg/dL — AB
Specific Gravity, Urine: 1.011 (ref 1.005–1.030)
pH: 7 (ref 5.0–8.0)

## 2020-10-09 LAB — RESP PANEL BY RT-PCR (FLU A&B, COVID) ARPGX2
Influenza A by PCR: NEGATIVE
Influenza B by PCR: NEGATIVE
SARS Coronavirus 2 by RT PCR: NEGATIVE

## 2020-10-09 LAB — MAGNESIUM: Magnesium: 1.9 mg/dL (ref 1.7–2.4)

## 2020-10-09 LAB — PROCALCITONIN: Procalcitonin: <0.1 ng/mL

## 2020-10-09 LAB — GLUCOSE, CAPILLARY: Glucose-Capillary: 132 mg/dL — ABNORMAL HIGH (ref 70–99)

## 2020-10-09 MED ORDER — IOHEXOL 300 MG/ML  SOLN
100.0000 mL | Freq: Once | INTRAMUSCULAR | Status: AC | PRN
  Administered 2020-10-09: 100 mL via INTRAVENOUS

## 2020-10-09 MED ORDER — PANTOPRAZOLE SODIUM 40 MG IV SOLR
40.0000 mg | Freq: Once | INTRAVENOUS | Status: AC
  Administered 2020-10-09: 40 mg via INTRAVENOUS
  Filled 2020-10-09: qty 40

## 2020-10-09 MED ORDER — ENOXAPARIN SODIUM 40 MG/0.4ML ~~LOC~~ SOLN
40.0000 mg | SUBCUTANEOUS | Status: DC
  Administered 2020-10-10 – 2020-10-12 (×3): 40 mg via SUBCUTANEOUS
  Filled 2020-10-09 (×4): qty 0.4

## 2020-10-09 MED ORDER — ACETAMINOPHEN 325 MG PO TABS
650.0000 mg | ORAL_TABLET | Freq: Once | ORAL | Status: DC
  Filled 2020-10-09: qty 2

## 2020-10-09 MED ORDER — HYDRALAZINE HCL 20 MG/ML IJ SOLN
10.0000 mg | Freq: Once | INTRAMUSCULAR | Status: AC
  Administered 2020-10-09: 10 mg via INTRAVENOUS
  Filled 2020-10-09: qty 1

## 2020-10-09 MED ORDER — POTASSIUM CHLORIDE 10 MEQ/100ML IV SOLN
10.0000 meq | Freq: Once | INTRAVENOUS | Status: AC
  Administered 2020-10-09: 10 meq via INTRAVENOUS
  Filled 2020-10-09: qty 100

## 2020-10-09 MED ORDER — POTASSIUM CHLORIDE IN NACL 40-0.9 MEQ/L-% IV SOLN
INTRAVENOUS | Status: DC
  Filled 2020-10-09 (×8): qty 1000

## 2020-10-09 MED ORDER — LOSARTAN POTASSIUM-HCTZ 100-25 MG PO TABS: 1.0000 | ORAL_TABLET | Freq: Every day | ORAL | Status: DC

## 2020-10-09 MED ORDER — SODIUM CHLORIDE 0.9 % IV SOLN
12.5000 mg | Freq: Four times a day (QID) | INTRAVENOUS | Status: DC | PRN
  Administered 2020-10-09: 12.5 mg via INTRAVENOUS
  Filled 2020-10-09 (×2): qty 0.5

## 2020-10-09 MED ORDER — SODIUM CHLORIDE 0.9 % IV SOLN
12.5000 mg | Freq: Four times a day (QID) | INTRAVENOUS | Status: DC | PRN
  Administered 2020-10-09 – 2020-10-11 (×3): 12.5 mg via INTRAVENOUS
  Filled 2020-10-09 (×5): qty 0.5

## 2020-10-09 MED ORDER — HYDROCHLOROTHIAZIDE 25 MG PO TABS
25.0000 mg | ORAL_TABLET | Freq: Every day | ORAL | Status: DC
  Administered 2020-10-10 – 2020-10-11 (×2): 25 mg via ORAL
  Filled 2020-10-09 (×2): qty 1

## 2020-10-09 MED ORDER — AMLODIPINE BESYLATE 10 MG PO TABS
10.0000 mg | ORAL_TABLET | Freq: Every day | ORAL | Status: DC
  Administered 2020-10-09 – 2020-10-13 (×5): 10 mg via ORAL
  Filled 2020-10-09 (×5): qty 1

## 2020-10-09 MED ORDER — HYDROCODONE-ACETAMINOPHEN 5-325 MG PO TABS: 1.0000 | ORAL_TABLET | ORAL | Status: DC | PRN

## 2020-10-09 MED ORDER — MORPHINE SULFATE (PF) 2 MG/ML IV SOLN: 2.0000 mg | INTRAVENOUS | Status: DC | PRN

## 2020-10-09 MED ORDER — ACETAMINOPHEN 325 MG PO TABS
650.0000 mg | ORAL_TABLET | Freq: Four times a day (QID) | ORAL | Status: DC | PRN
  Administered 2020-10-11: 650 mg via ORAL
  Filled 2020-10-09 (×2): qty 2

## 2020-10-09 MED ORDER — LOSARTAN POTASSIUM 50 MG PO TABS
100.0000 mg | ORAL_TABLET | Freq: Every day | ORAL | Status: DC
  Administered 2020-10-10 – 2020-10-13 (×4): 100 mg via ORAL
  Filled 2020-10-09 (×4): qty 2

## 2020-10-09 MED ORDER — SODIUM CHLORIDE 0.9% FLUSH
3.0000 mL | Freq: Two times a day (BID) | INTRAVENOUS | Status: DC
  Administered 2020-10-09 – 2020-10-13 (×6): 3 mL via INTRAVENOUS

## 2020-10-09 MED ORDER — POTASSIUM CHLORIDE 10 MEQ/100ML IV SOLN
10.0000 meq | INTRAVENOUS | Status: DC
  Administered 2020-10-09 (×3): 10 meq via INTRAVENOUS
  Filled 2020-10-09 (×3): qty 100

## 2020-10-09 MED ORDER — INSULIN ASPART 100 UNIT/ML ~~LOC~~ SOLN
0.0000 [IU] | Freq: Three times a day (TID) | SUBCUTANEOUS | Status: DC
  Administered 2020-10-09 – 2020-10-10 (×3): 2 [IU] via SUBCUTANEOUS
  Administered 2020-10-11: 3 [IU] via SUBCUTANEOUS
  Administered 2020-10-12: 2 [IU] via SUBCUTANEOUS

## 2020-10-09 MED ORDER — ONDANSETRON HCL 4 MG/2ML IJ SOLN
4.0000 mg | Freq: Four times a day (QID) | INTRAMUSCULAR | Status: DC | PRN
  Administered 2020-10-09 – 2020-10-13 (×4): 4 mg via INTRAVENOUS
  Filled 2020-10-09 (×4): qty 2

## 2020-10-09 MED ORDER — LACTATED RINGERS IV SOLN: INTRAVENOUS | Status: DC

## 2020-10-09 MED ORDER — ACETAMINOPHEN 650 MG RE SUPP: 650.0000 mg | Freq: Four times a day (QID) | RECTAL | Status: DC | PRN

## 2020-10-09 MED ORDER — PIPERACILLIN-TAZOBACTAM 3.375 G IVPB
3.3750 g | Freq: Three times a day (TID) | INTRAVENOUS | Status: DC
  Administered 2020-10-09 – 2020-10-12 (×9): 3.375 g via INTRAVENOUS
  Filled 2020-10-09 (×10): qty 50

## 2020-10-09 MED ORDER — ONDANSETRON HCL 4 MG PO TABS
4.0000 mg | ORAL_TABLET | Freq: Four times a day (QID) | ORAL | Status: DC | PRN
Start: 2020-10-09 — End: 2020-10-13

## 2020-10-09 MED ORDER — ONDANSETRON 4 MG PO TBDP
4.0000 mg | ORAL_TABLET | Freq: Once | ORAL | Status: AC
  Administered 2020-10-09: 4 mg via ORAL
  Filled 2020-10-09: qty 1

## 2020-10-09 MED ORDER — SODIUM CHLORIDE 0.9 % IV BOLUS
1000.0000 mL | Freq: Once | INTRAVENOUS | Status: AC
  Administered 2020-10-09: 1000 mL via INTRAVENOUS

## 2020-10-09 MED ORDER — HYDRALAZINE HCL 20 MG/ML IJ SOLN
5.0000 mg | INTRAMUSCULAR | Status: DC | PRN
  Administered 2020-10-09 – 2020-10-12 (×7): 5 mg via INTRAVENOUS
  Filled 2020-10-09 (×7): qty 1

## 2020-10-09 NOTE — ED Notes (Signed)
Notified Krista Blue of pt's HTN and obtained orders.

## 2020-10-09 NOTE — ED Notes (Signed)
Pt assisted to restroom.  

## 2020-10-09 NOTE — ED Notes (Signed)
Pt actively vomiting. Unable to administer acetaminophen at this time.

## 2020-10-09 NOTE — ED Provider Notes (Signed)
[facility] EMERGENCY DEPARTMENT Provider Note   CSN: [id number] Arrival date & time: 10/09/20  0404     History No chief complaint on file.   [name] is a 65 y.o. female with past medical history of HTN, HLD, cerebral aneurysm followed by neurology, and prediabetes per most recent note from her primary care provider who presents to the ED with complaints of nausea, vomiting, and chills.  On my examination, patient reports that yesterday she began experiencing abdominal discomfort, nausea and emesis, and diarrhea.  She denies any recent antibiotic use.  She also denies any hematemesis, hematochezia, or melena.  She has been experiencing weakness and profound chills.  She is notably hypertensive here in the ED, but states that is because she has been unable to tolerate anything by mouth.  She cannot think of anything that may have precipitated these symptoms.  She has been unable to take her home medications.   I reviewed patient's medical record and she was admitted 04/2018 for similar symptoms.  She had a similarly elevated white blood cell count and hypokalemia and required potassium replacement, fluids, and ongoing antiemetics.  She also was diagnosed with malignant hypertension secondary to distress from her nausea and vomiting symptoms.  She was treated with Unasyn.  CT angio chest abdomen and pelvis was unremarkable.  Patient was ultimately diagnosed with infectious gastroenteritis but with negative GI pathogen panel.   HPI     Past Medical History:  Diagnosis Date  . Anemia    hx  . Aneurysm ([place])    small, on left side of brain   . Arthritis    "all over" (06/19/2017)  . Diabetes mellitus without complication (HCC)    diet control, pt denies  . Hyperlipidemia   . Hypertension   . TIA (transient ischemic attack)    pt unaware of this hx on 06/19/2017    Patient Active Problem List   Diagnosis Date Noted  . Bilateral impacted cerumen  04/02/2019  . Conductive hearing loss, bilateral 04/02/2019  . Nausea and vomiting 05/17/2018  . Enteritis 06/19/2017  . Abdominal pain 06/18/2017  . Hyperlipidemia 06/18/2017  . Elevated glucose 06/18/2017  . Need for hepatitis C screening test 05/23/2017  . Breast pain, left 01/05/2016  . Left ankle sprain 01/20/2015  . Gastroenteritis 11/08/2014  . Malaise 10/14/2014  . Hypokalemia 10/14/2014  . Essential hypertension   . Bilateral wrist pain 10/06/2014  . Hot flashes, menopausal 10/06/2014  . Cerebral aneurysm without rupture 08/20/2014  . TIA (transient ischemic attack) 08/19/2014  . Accelerated hypertension 02/24/2013  . Depression 02/24/2013    Past Surgical History:  Procedure Laterality Date  . ANTERIOR CERVICAL DECOMP/DISCECTOMY FUSION    . BACK SURGERY    . CESAREAN SECTION  1986  . FOOT SURGERY Left 11/2019  . IR GENERIC HISTORICAL  09/21/2014   IR ANGIO INTRA EXTRACRAN SEL INTERNAL CAROTID UNI L MOD SED 09/21/2014 [name], MD [facility]  . IR GENERIC HISTORICAL  09/21/2014   IR ANGIO INTRA EXTRACRAN SEL COM CAROTID INNOMINATE UNI R MOD SED 09/21/2014 [name], MD [facility]  . IR GENERIC HISTORICAL  09/21/2014   IR ANGIO VERTEBRAL SEL VERTEBRAL UNI L MOD SED 09/21/2014 [name], MD [facility]  . IR GENERIC HISTORICAL  09/21/2014   IR 3D INDEPENDENT WKST 09/21/2014 [name], MD [facility]  . LAPAROSCOPIC CHOLECYSTECTOMY    . TUBAL LIGATION       OB History  Gravida  2   Para  1   Term  1   Preterm      AB  1   Living  1     SAB  1   IAB      Ectopic      Multiple      Live Births  1           Family History  Problem Relation Age of Onset  . Hypertension Mother   . Arthritis Mother   . Diabetes Mother   . Dementia Mother   . Heart disease Father   . Diabetes Sister   . Diabetes Brother   . Diabetes Sister   . Cancer Neg Hx   . Colon cancer Neg Hx   . Colon polyps Neg Hx   .  Esophageal cancer Neg Hx   . Rectal cancer Neg Hx   . Stomach cancer Neg Hx     Social History   Tobacco Use  . Smoking status: Never Smoker  . Smokeless tobacco: Never Used  Vaping Use  . Vaping Use: Never used  Substance Use Topics  . Alcohol use: Yes    Alcohol/week: 3.0 standard drinks    Types: 3 Standard drinks or equivalent per week    Comment: socially   . Drug use: Not Currently    Home Medications Prior to Admission medications   Medication Sig Start Date End Date Taking? Authorizing Provider  amLODipine (NORVASC) 10 MG tablet Take 1 tablet (10 mg total) by mouth daily. 03/09/20   [name], NP  aspirin EC 81 MG tablet Take 1 tablet (81 mg total) by mouth daily. 02/01/16   Funches, Adriana Mccallum, MD  atorvastatin (LIPITOR) 20 MG tablet Take 1 tablet (20 mg total) by mouth daily. 03/09/20   [name], NP  carvedilol (COREG) 3.125 MG tablet Take 3.125 mg by mouth 2 (two) times daily. Patient not taking: Reported on 05/31/2020 01/18/20   [provider]  ibuprofen (ADVIL) 600 MG tablet Take 1 tablet (600 mg total) by mouth every 8 (eight) hours as needed. 05/02/20   [name], NP  losartan-hydrochlorothiazide (HYZAAR) 100-25 MG tablet Take 1 tablet by mouth daily. 03/09/20   [name], NP  meclizine (ANTIVERT) 25 MG tablet Take 1 tablet (25 mg total) by mouth 3 (three) times daily as needed for dizziness. Patient not taking: Reported on 05/31/2020 09/07/19   [name], NP  ondansetron (ZOFRAN ODT) 4 MG disintegrating tablet Take 1 tablet (4 mg total) by mouth every 8 (eight) hours as needed for nausea or vomiting. Patient not taking: Reported on 05/31/2020 08/26/19   Quintella Reichert, MD  tiZANidine (ZANAFLEX) 2 MG tablet Take 1 tablet (2 mg total) by mouth every 6 (six) hours as needed for muscle spasms. 05/05/20   [name], NP    Allergies    Patient has no known allergies.  Review of Systems   Review of  Systems  Physical Exam Updated Vital Signs BP (!) 190/87   Pulse 86   Temp (!) 96.7 F (35.9 C) (Rectal)   Resp 16   Ht 5\' 1"  (1.549 m)   Wt 52.6 kg   SpO2 99%   BMI 21.92 kg/m   Physical Exam  ED Results / Procedures / Treatments   Labs (all labs ordered are listed, but only abnormal results are displayed) Labs Reviewed  CBC WITH DIFFERENTIAL/PLATELET - Abnormal; Notable for the following components:  Result Value   WBC 17.3 (*)    Neutro Abs 14.1 (*)    Abs Immature Granulocytes 0.10 (*)    All other components within normal limits  COMPREHENSIVE METABOLIC PANEL - Abnormal; Notable for the following components:   Potassium 2.7 (*)    Glucose, Bld 234 (*)    BUN 7 (*)    Total Protein 8.2 (*)    All other components within normal limits  LACTIC ACID, PLASMA - Abnormal; Notable for the following components:   Lactic Acid, Venous 3.7 (*)    All other components within normal limits  LACTIC ACID, PLASMA - Abnormal; Notable for the following components:   Lactic Acid, Venous 2.7 (*)    All other components within normal limits  URINALYSIS, ROUTINE W REFLEX MICROSCOPIC - Abnormal; Notable for the following components:   Color, Urine STRAW (*)    Glucose, UA >=500 (*)    Protein, ur 100 (*)    All other components within normal limits  CBG MONITORING, ED - Abnormal; Notable for the following components:   Glucose-Capillary 177 (*)    All other components within normal limits  RESP PANEL BY RT-PCR (FLU A&B, COVID) ARPGX2  CULTURE, BLOOD (ROUTINE X 2)  CULTURE, BLOOD (ROUTINE X 2)  URINE CULTURE  GASTROINTESTINAL PANEL BY PCR, STOOL (REPLACES STOOL CULTURE)  RAPID URINE DRUG SCREEN, HOSP PERFORMED  TROPONIN I (HIGH SENSITIVITY)    EKG EKG Interpretation  Date/Time:  Sunday October 09 2020 05:08:46 EDT Ventricular Rate:  83 PR Interval:  162 QRS Duration: 90 QT Interval:  376 QTC Calculation: 441 R Axis:   -54 Text Interpretation: Normal sinus rhythm Left  axis deviation Cannot rule out Anterior infarct , age undetermined T wave abnormality, consider lateral ischemia Abnormal ECG Confirmed by Thamas Jaegers ([id number]) on 10/09/2020 7:10:39 AM   Radiology DG Chest 2 View  Result Date: 10/09/2020 CLINICAL DATA:  Shortness of breath EXAM: CHEST - 2 VIEW COMPARISON:  05/14/2019 FINDINGS: Normal heart size and mediastinal contours. There is no edema, consolidation, effusion, or pneumothorax. Bilateral nipple shadows. No acute osseous finding. IMPRESSION: No evidence of active disease. Electronically Signed   By: Monte Fantasia M.D.   On: 10/09/2020 05:57   CT ABDOMEN PELVIS W CONTRAST  Result Date: 10/09/2020 CLINICAL DATA:  Abdominal pain and fever with emesis EXAM: CT ABDOMEN AND PELVIS WITH CONTRAST TECHNIQUE: Multidetector CT imaging of the abdomen and pelvis was performed using the standard protocol following bolus administration of intravenous contrast. CONTRAST:  165mL OMNIPAQUE IOHEXOL 300 MG/ML  SOLN COMPARISON:  08/29/2019 FINDINGS: Lower chest:  Mild scarring at the lingula. Hepatobiliary: No focal liver abnormality.Cholecystectomy. No bile duct dilatation. Pancreas: Unremarkable. Spleen: Unremarkable. Adrenals/Urinary Tract: Negative adrenals. Right pelviectasis without hydronephrosis. 17 mm cyst at the upper pole left kidney. Additional smaller presumed cysts in the bilateral renal cortex. Unremarkable bladder. Stomach/Bowel: No obstruction. No appendicitis. Multiple colonic diverticula. Vascular/Lymphatic: No acute vascular abnormality. No mass or adenopathy. Reproductive:No acute finding. Probable posterior intramural fibroid measuring 17 mm on reformats. Other: No ascites or pneumoperitoneum. Ventral abdominal wall scarring presumably associated with the hernia repair. Musculoskeletal: No acute abnormalities. Spondylosis and facet spurring. Degenerative L4-5 anterolisthesis IMPRESSION: 1. No acute finding.  No bowel obstruction or visible inflammation.  2. Multiple colonic diverticula. Electronically Signed   By: Monte Fantasia M.D.   On: 10/09/2020 08:58    Procedures Procedures   Medications Ordered in ED Medications  acetaminophen (TYLENOL) tablet 650 mg (650 mg Oral Not Given  10/09/20 0748)  promethazine (PHENERGAN) 12.5 mg in sodium chloride 0.9 % 50 mL IVPB (0 mg Intravenous Stopped 10/09/20 1007)  potassium chloride 10 mEq in 100 mL IVPB (10 mEq Intravenous New Bag/Given 10/09/20 1010)  ondansetron (ZOFRAN-ODT) disintegrating tablet 4 mg (4 mg Oral Given 10/09/20 0440)  sodium chloride 0.9 % bolus 1,000 mL (0 mLs Intravenous Stopped 10/09/20 1012)  pantoprazole (PROTONIX) injection 40 mg (40 mg Intravenous Given 10/09/20 0743)  iohexol (OMNIPAQUE) 300 MG/ML solution 100 mL (100 mLs Intravenous Contrast Given 10/09/20 0833)  hydrALAZINE (APRESOLINE) injection 10 mg (10 mg Intravenous Given 10/09/20 8242)    ED Course  I have reviewed the triage vital signs and the nursing notes.  Pertinent labs & imaging results that were available during my care of the patient were reviewed by me and considered in my medical decision making (see chart for details).  Clinical Course as of 10/09/20 1012  Sun Oct 09, 2020  3536 Lactic Acid, Venous(!!): 2.7 [GG]  0956 Potassium(!!): 2.7 [GG]  0956 WBC(!): 17.3 [GG]    Clinical Course User Index [GG] Corena Herter, PA-C   MDM Rules/Calculators/A&P                          [name] was evaluated in Emergency Department on 10/09/2020 for the symptoms described in the history of present illness. She was evaluated in the context of the global COVID-19 pandemic, which necessitated consideration that the patient might be at risk for infection with the SARS-CoV-2 virus that causes COVID-19. Institutional protocols and algorithms that pertain to the evaluation of patients at risk for COVID-19 are in a state of rapid change based on information released by regulatory bodies including the CDC and  federal and state organizations. These policies and algorithms were followed during the patient's care in the ED.  I personally reviewed patient's medical chart and all notes from triage and staff during today's encounter. I have also ordered and reviewed all labs and imaging that I felt to be medically necessary in the evaluation of this patient's complaints and with consideration of their physical exam. If needed, translation services were available and utilized.   Patient with intractable nausea and vomiting.  She is hypokalemic to 2.7 due to her incessant vomiting.  She has been given Zofran, Phenergan, and Protonix here in the ED without any significant relief.  She denies any recent illicit drug use or alcohol.  We will add on UDS given that there is no clear explanation for her symptoms.  While she was diagnosed with prediabetes and most recent primary care office encounter, her glucose was 234.  Suspect that she has progressed to type II DM.  Recommending that she gets A1c rechecked.   Patient with chest discomfort that she describes as being due to her nausea and emesis, but given her elevated blood pressures and lack of any other explanation for her intractable nausea and vomiting, will obtain troponin to evaluate for atypical ACS presentation.  She is notably hypertensive as high as 226/102 here in ED.  She states that is due to not being able to take her blood pressure medications.  This was seen during prior hospitalist admission for similar presentation and improved with her home antihypertensives.  Will administer 10 mg IV hydralazine here in the ED given inability tolerate p.o. intake.  Despite treatments here in the ED with IVF, antiemetics, prodromal maneuvers, she continues to endorse abdominal cramping in addition to  intractable nausea and vomiting.  Laboratory work-up notable for hypokalemia 2.7, leukocytosis to 17.3, and lactic acid that has down trended from 3.7-2.7.  Bair hugger was  placed for her 96.7 degree rectal temperature.  Will consult hospitalist for admission for ongoing management.  I spoke with Dr. Lorin Mercy who will see and admit patient.  Final Clinical Impression(s) / ED Diagnoses Final diagnoses:  Intractable vomiting with nausea, unspecified vomiting type  Hypokalemia    Rx / DC Orders ED Discharge Orders    None       Reita Chard 10/09/20 1012    Luna Fuse, MD 10/09/20 1037

## 2020-10-09 NOTE — ED Triage Notes (Signed)
Pt reports having chills, diarrhea, vomiting, and lower abdominal pain. Started yesterday afternoon.

## 2020-10-09 NOTE — ED Notes (Signed)
2 diarrhea episodes since 1050AM. MD aware. Contaminated sample, unable to collect. Will attempt to collect the next diarrhea episode. Pt is aware sample is need to test for GI panel.

## 2020-10-09 NOTE — ED Notes (Signed)
Pt is asking for nausea medication. Phenergan given at [phone] with mild relief. Admit team paged.

## 2020-10-09 NOTE — Progress Notes (Signed)
Pharmacy Antibiotic Note  NOVIS LEAGUE is a 65 y.o. female admitted on 10/09/2020 presenting with N/V/D abd pain, concern for IAI.  Pharmacy has been consulted for zosyn dosing.  Plan: Zosyn 3.375g IV every 8 hours (extended infusion) Monitor renal function, ID workup and LOT  Height: 5\' 1"  (154.9 cm) Weight: 52.6 kg (116 lb) IBW/kg (Calculated) : 47.8  Temp (24hrs), Avg:98.4 F (36.9 C), Min:96.1 F (35.6 C), Max:100.8 F (38.2 C)  Recent Labs  Lab 10/09/20 0436 10/09/20 0437 10/09/20 0707  WBC 17.3*  --   --   CREATININE 0.68  --   --   LATICACIDVEN  --  3.7* 2.7*    Estimated Creatinine Clearance: 52.2 mL/min (by C-G formula based on SCr of 0.68 mg/dL).    No Known Allergies  Bertis Ruddy, PharmD Clinical Pharmacist ED Pharmacist Phone # [phone] 10/09/2020 4:13 PM

## 2020-10-09 NOTE — Progress Notes (Addendum)
NEW ADMISSION NOTE New Admission Note:   Arrival Method: stretcher  Mental Orientation: A& O X4 Telemetry: [id number] Assessment: Completed Skin: intact IV:  LAC, RAC Pain: 0/10 Tubes: NONE Safety Measures: Safety Fall Prevention Plan has been given, discussed and signed Admission: Completed [facility] Orientation: Patient has been orientated to the room, unit and staff.  Family: niece at bedside  Orders have been reviewed and implemented. Will continue to monitor the patient. Call light has been placed within reach and bed alarm has been activated.   Zira Helinski S Jaxin Fulfer, RN

## 2020-10-09 NOTE — ED Triage Notes (Signed)
Emergency Medicine Provider Triage Evaluation Note  [name] , a 65 y.o. female  was evaluated in triage.  Pt complains of chills, nausea, vomiting, diarrhea, cough, shortness of breath, chest pain that began yesterday.  She denies melena, hematochezia, rash, sore throat, loss of sense of taste or smell, dysuria, hematuria, fever.  She has been unable to take her home medications due to her symptoms.  She is actively vomiting in triage.  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  Review of Systems  Positive: Chills, nausea, vomiting, diarrhea, cough, shortness of breath, chest pain Negative: Melena, hematochezia, rash, sore throat, loss of sense of taste or smell, dysuria, hematuria fever  Physical Exam  BP (!) 215/125 (BP Location: Left Arm)   Pulse 78   Temp 99 F (37.2 C)   Resp 20   Ht 5\' 1"  (1.549 m)   Wt 52.6 kg   SpO2 100%   BMI 21.92 kg/m  Gen:   Awake, no distress, ill appearing, nontoxic HEENT:  Atraumatic  Resp:  Normal effort  Cardiac:  Normal rate  Abd:   Nondistended, tender to palpation bilateral lower abdomen without rebound or guarding MSK:   Moves extremities without difficulty  Neuro:  Speech clear   Medical Decision Making  Medically screening exam initiated at 4:39 AM.  Appropriate orders placed.  [name] was informed that the remainder of the evaluation will be completed by another provider, this initial triage assessment does not replace that evaluation, and the importance of remaining in the ED until their evaluation is complete.  Clinical Impression  65 year old female with a 2-day history of nausea, vomiting, diarrhea, cough, shortness of breath, chest pain, chills.  She is actively vomiting in triage.  She is markedly hypertensive, but I suspect that this is secondary to not being able to tolerate her home p.o. meds.  Will give Zofran and plan to give Tylenol for chills shortly after Zofran.  Labs, EKG, chest x-ray  have been ordered.  She will need to be monitored closely and will require further work-up and evaluation in the emergency department.   Joline Maxcy A, PA-C 10/09/20 [phone]

## 2020-10-09 NOTE — H&P (Signed)
History and Physical    [name] BMW:[id number] DOB: 12-Jun-1956 DOA: 10/09/2020  PCP: [name], NP Consultants:  [facility] - neurology; [facility] - podiatry Patient coming from:  Home - lives with mother and daughter; NOK: Niece, [phone]  Chief Complaint: Abdominal pain, n/v/d  HPI: [name] is a 65 y.o. female with medical history significant of TIA; HTN; HLD; and DM presenting with n/v/d/abd pain.  She started throwing up yesterday.  +diarrhea.  Emesis x 7, diarrhea numerous.  She was found to be hypothermic and was placed on a Retail banker.  She was having pain across her lower abdomen although this is improved now.  No recent antibiotics.  No sick contacts.    ED Course:  Unsure what is going on - 1 day of abdominal pain, n/v/d.  Continues to have symptoms.  Lactate 3.7 -> 2.7.  WBC 17.  Low K+, repleting.  Gven Zofran/phenergan.  CT unremarkable.  Similar presentation in 2019.  Troponin added but low suspicion for ACS.  BP up to 226/102.  Review of Systems: As per HPI; otherwise review of systems reviewed and negative.   Ambulatory Status:  Ambulates without assistance  COVID Vaccine Status:  Complete  Past Medical History:  Diagnosis Date  . Anemia    hx  . 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 Arthritis    "all over" (06/19/2017)  . Diabetes mellitus without complication (HCC)    diet control, pt denies  . Hyperlipidemia   . Hypertension   . TIA (transient ischemic attack)    pt unaware of this hx on 06/19/2017    Past Surgical History:  Procedure Laterality Date  . ANTERIOR CERVICAL DECOMP/DISCECTOMY FUSION    . BACK SURGERY    . CESAREAN SECTION  1986  . FOOT SURGERY Left 11/2019  . IR GENERIC HISTORICAL  09/21/2014   IR ANGIO INTRA EXTRACRAN SEL INTERNAL CAROTID UNI L MOD SED 09/21/2014 [name], MD [facility]  . IR GENERIC HISTORICAL  09/21/2014   IR ANGIO INTRA EXTRACRAN SEL COM CAROTID INNOMINATE UNI R MOD  SED 09/21/2014 [name], MD [facility]  . IR GENERIC HISTORICAL  09/21/2014   IR ANGIO VERTEBRAL SEL VERTEBRAL UNI L MOD SED 09/21/2014 [name], MD [facility]  . IR GENERIC HISTORICAL  09/21/2014   IR 3D INDEPENDENT WKST 09/21/2014 [name], MD [facility]  . LAPAROSCOPIC CHOLECYSTECTOMY    . TUBAL LIGATION      Social History   Socioeconomic History  . Marital status: Widowed    Spouse name: Not on file  . Number of children: 1  . Years of education: Not on file  . Highest education level: Some college, no degree  Occupational History  . 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 Smoking status: Never Smoker  . Smokeless tobacco: Never Used  Vaping Use  . Vaping Use: Never used  Substance and Sexual Activity  . Alcohol use: Yes    Alcohol/week: 3.0 standard drinks    Types: 3 Standard drinks or equivalent per week    Comment: socially   . Drug use: Not Currently  . Sexual activity: Yes  Other Topics Concern  . 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 Hypertension Mother   . Arthritis Mother   . Diabetes Mother   . Dementia Mother   . Heart disease Father   . Diabetes Sister   . Diabetes Brother   . Diabetes Sister   . Cancer Neg Hx   . Colon cancer Neg Hx   . Colon polyps Neg Hx   . Esophageal cancer Neg Hx   . Rectal cancer Neg Hx   . Stomach cancer Neg Hx     Prior to Admission medications   Medication Sig Start  Date End Date Taking? Authorizing Provider  amLODipine (NORVASC) 10 MG tablet Take 1 tablet (10 mg total) by mouth daily. 03/09/20   [name], NP  aspirin EC 81 MG tablet Take 1 tablet (81 mg total) by mouth daily. 02/01/16   Funches, Adriana Mccallum, MD  atorvastatin (LIPITOR) 20 MG tablet Take 1 tablet (20 mg total) by mouth daily. 03/09/20   [name], NP  carvedilol (COREG) 3.125 MG tablet Take 3.125 mg by mouth 2 (two) times daily. Patient not taking: Reported on 05/31/2020 01/18/20   [provider]  ibuprofen (ADVIL) 600 MG tablet Take 1 tablet (600 mg total) by mouth every 8 (eight) hours as needed. 05/02/20   [name], NP  losartan-hydrochlorothiazide (HYZAAR) 100-25 MG tablet Take 1 tablet by mouth daily. 03/09/20   [name], NP  meclizine (ANTIVERT) 25 MG tablet Take 1 tablet (25 mg total) by mouth 3 (three) times daily as needed for dizziness. Patient not taking: Reported on 05/31/2020 09/07/19   [name], NP  ondansetron (ZOFRAN ODT) 4 MG disintegrating tablet Take 1 tablet (4 mg total) by mouth every 8 (eight) hours as needed for nausea or vomiting. Patient not taking: Reported on 05/31/2020 08/26/19   Quintella Reichert, MD  tiZANidine (ZANAFLEX) 2 MG tablet Take 1 tablet (2 mg total) by mouth every 6 (six) hours as needed for muscle spasms. 05/05/20   [name], NP    Physical Exam: Vitals:   10/09/20 1311 10/09/20 1400 10/09/20 1445 10/09/20 1552  BP: (!) 198/90 (!) 163/84 (!) 181/92 (!) 178/87  Pulse: 93 (!) 106 (!) 106 (!) 101  Resp: 16 19 (!) 21 (!) 21  Temp: 99 F (37.2 C)   98.8 F (37.1 C)  TempSrc: Oral   Oral  SpO2: 99% 99% 98% 98%  Weight:      Height:         . General:  Appears calm and comfortable and is in NAD; appears disinterested, although at my first visit she was squatting at the side of the bed and using the bedpan on the floor (ineffectively).  Bair hugger in place but removed during my  evaluation. . Eyes:  PERRL, EOMI, normal lids, iris . ENT:  grossly normal hearing, lips & tongue, mmm . Neck:  no LAD, masses or thyromegaly . Cardiovascular:  RRR, no m/r/g. No LE edema.  [name] Respiratory:   CTA bilaterally with no wheezes/rales/rhonchi.  Normal respiratory effort. . Abdomen:  soft, mild diffuse TTP in lower quadrants, ND . Skin:  no rash or  induration seen on limited exam . Musculoskeletal:  grossly normal tone BUE/BLE, good ROM, no bony abnormality . Psychiatric:  Flat mood and affect, speech fluent and appropriate, AOx3 . Neurologic:  CN 2-12 grossly intact, moves all extremities in coordinated fashion    Radiological Exams on Admission: Independently reviewed - see discussion in A/P where applicable  DG Chest 2 View  Result Date: 10/09/2020 CLINICAL DATA:  Shortness of breath EXAM: CHEST - 2 VIEW COMPARISON:  05/14/2019 FINDINGS: Normal heart size and mediastinal contours. There is no edema, consolidation, effusion, or pneumothorax. Bilateral nipple shadows. No acute osseous finding. IMPRESSION: No evidence of active disease. Electronically Signed   By: Monte Fantasia M.D.   On: 10/09/2020 05:57   CT ABDOMEN PELVIS W CONTRAST  Result Date: 10/09/2020 CLINICAL DATA:  Abdominal pain and fever with emesis EXAM: CT ABDOMEN AND PELVIS WITH CONTRAST TECHNIQUE: Multidetector CT imaging of the abdomen and pelvis was performed using the standard protocol following bolus administration of intravenous contrast. CONTRAST:  142mL OMNIPAQUE IOHEXOL 300 MG/ML  SOLN COMPARISON:  08/29/2019 FINDINGS: Lower chest:  Mild scarring at the lingula. Hepatobiliary: No focal liver abnormality.Cholecystectomy. No bile duct dilatation. Pancreas: Unremarkable. Spleen: Unremarkable. Adrenals/Urinary Tract: Negative adrenals. Right pelviectasis without hydronephrosis. 17 mm cyst at the upper pole left kidney. Additional smaller presumed cysts in the bilateral renal cortex. Unremarkable bladder.  Stomach/Bowel: No obstruction. No appendicitis. Multiple colonic diverticula. Vascular/Lymphatic: No acute vascular abnormality. No mass or adenopathy. Reproductive:No acute finding. Probable posterior intramural fibroid measuring 17 mm on reformats. Other: No ascites or pneumoperitoneum. Ventral abdominal wall scarring presumably associated with the hernia repair. Musculoskeletal: No acute abnormalities. Spondylosis and facet spurring. Degenerative L4-5 anterolisthesis IMPRESSION: 1. No acute finding.  No bowel obstruction or visible inflammation. 2. Multiple colonic diverticula. Electronically Signed   By: Monte Fantasia M.D.   On: 10/09/2020 08:58    EKG: Independently reviewed.  NSR with rate 83; nonspecific ST changes with no evidence of acute ischemia   Labs on Admission: I have personally reviewed the available labs and imaging studies at the time of the admission.  Pertinent labs:   K+ 2.7 Glucose 234 Lactate 3.4 -> 2.7 WBC 17.3 COVID/flu negative UA: >500 glucose, 100 protein   Assessment/Plan Principal Problem:   Enteritis of infectious origin Active Problems:   Hypokalemia   Essential hypertension   Hyperlipidemia   Diabetes mellitus type 2 in nonobese (HCC)   Gastroenteritis vs. diverticulitis -Patient with acute onset of n/v/d and abdominal pain -No sick contacts, apparent foodborne illness, or recent antibiotics -She was hypothermic upon arrival but this has resolved -Patient's symptoms are c/w gastroenteritis vs. Diverticulitis -CT unremarkable  -SIRS criteria include tachycardia, tachypnea, and leukocytosis -Her lactate was elevated; while this may be related to acute GI illness and mild dehydration, it also begs the question of ischemic colitis, particularly in light of the hematochezia -CT could be repeated with contrast, if needed -For now, will give clear liquids, IVF, pain medication with morphine, nausea medication with Zosyn/phenergan, and treat with Zosyn  for intraabdominal infection -If not improving, she may need CT and/or GI/surgery consultation -GI pathogen panel ordered by EDP and is pending; no Imodium until stool sample has been collected. -Will trend lactate  Hypokalemia -Repleted in ER with 10 mEq KCl; will change IVF to NS with 40 mEq KCl/L and give at 100 cc/hr (so 40 mEq q 10 hours or roughly 80 mEq overnight). -Will follow.   -Will check Mag level.  HTN -Resume Norvasc  today -Resume Losartan/HCTZ tomorrow  HLD -Patient chose to stop taking Lipitor  DM -A1c in 08/2019 was 5.8; will recheck -She is not taking medications for this issue -Significant hyperglycemia -will cover with moderate-scale SSI    Note: This patient has been tested and is negative for the novel coronavirus COVID-19. The patient has been fully vaccinated against COVID-19.   Level of care: Telemetry MedicalTele med DVT prophylaxis:  Lovenox  Code Status:  Full - confirmed with patient Family Communication: None present Disposition Plan:  The patient is from: home  Anticipated d/c is to: home without [facility] services   Anticipated d/c date will depend on clinical response to treatment, but possibly as early as tomorrow if she has excellent response to treatment  Patient is currently: acutely ill Consults called:  None Admission status:  It is my clinical opinion that referral for OBSERVATION is reasonable and necessary in this patient based on the above information provided. The aforementioned taken together are felt to place the patient at high risk for further clinical deterioration. However it is anticipated that the patient may be medically stable for discharge from the hospital within 24 to 48 hours.    Karmen Bongo MD [facility]   How to contact the [facility] Attending or Consulting provider [place] or covering provider during after hours [place], for this patient?  1. Check the care team in [facility] and look for a) attending/consulting [facility]  provider listed and b) the [facility] team listed 2. Log into [email or web address] and use [place]'s universal password to access. If you do not have the password, please contact the hospital operator. 3. Locate the [facility] provider you are looking for under [facility] and page to a number that you can be directly reached. 4. If you still have difficulty reaching the provider, please page the [facility] (Director on Call) for the Hospitalists listed on amion for assistance.   10/09/2020, 4:20 PM

## 2020-10-09 NOTE — ED Notes (Signed)
Bear hugger removed due to temp 100.8

## 2020-10-09 NOTE — ED Notes (Signed)
Patient transported to CT 

## 2020-10-09 NOTE — ED Notes (Signed)
Placed pt on bair hugger 

## 2020-10-10 DIAGNOSIS — E538 Deficiency of other specified B group vitamins: Secondary | ICD-10-CM | POA: Diagnosis not present

## 2020-10-10 DIAGNOSIS — E860 Dehydration: Secondary | ICD-10-CM | POA: Diagnosis present

## 2020-10-10 DIAGNOSIS — Z8673 Personal history of transient ischemic attack (TIA), and cerebral infarction without residual deficits: Secondary | ICD-10-CM | POA: Diagnosis not present

## 2020-10-10 DIAGNOSIS — Z9049 Acquired absence of other specified parts of digestive tract: Secondary | ICD-10-CM | POA: Diagnosis not present

## 2020-10-10 DIAGNOSIS — E872 Acidosis: Secondary | ICD-10-CM | POA: Diagnosis not present

## 2020-10-10 DIAGNOSIS — Z79899 Other long term (current) drug therapy: Secondary | ICD-10-CM | POA: Diagnosis not present

## 2020-10-10 DIAGNOSIS — Z8249 Family history of ischemic heart disease and other diseases of the circulatory system: Secondary | ICD-10-CM | POA: Diagnosis not present

## 2020-10-10 DIAGNOSIS — I10 Essential (primary) hypertension: Secondary | ICD-10-CM | POA: Diagnosis not present

## 2020-10-10 DIAGNOSIS — E785 Hyperlipidemia, unspecified: Secondary | ICD-10-CM

## 2020-10-10 DIAGNOSIS — Z20822 Contact with and (suspected) exposure to covid-19: Secondary | ICD-10-CM | POA: Diagnosis not present

## 2020-10-10 DIAGNOSIS — A09 Infectious gastroenteritis and colitis, unspecified: Secondary | ICD-10-CM | POA: Diagnosis not present

## 2020-10-10 DIAGNOSIS — I160 Hypertensive urgency: Secondary | ICD-10-CM | POA: Diagnosis not present

## 2020-10-10 DIAGNOSIS — R112 Nausea with vomiting, unspecified: Secondary | ICD-10-CM | POA: Diagnosis not present

## 2020-10-10 DIAGNOSIS — Z981 Arthrodesis status: Secondary | ICD-10-CM | POA: Diagnosis not present

## 2020-10-10 DIAGNOSIS — E119 Type 2 diabetes mellitus without complications: Secondary | ICD-10-CM | POA: Diagnosis not present

## 2020-10-10 DIAGNOSIS — A419 Sepsis, unspecified organism: Secondary | ICD-10-CM | POA: Diagnosis present

## 2020-10-10 DIAGNOSIS — Z7982 Long term (current) use of aspirin: Secondary | ICD-10-CM | POA: Diagnosis not present

## 2020-10-10 DIAGNOSIS — Z833 Family history of diabetes mellitus: Secondary | ICD-10-CM | POA: Diagnosis not present

## 2020-10-10 DIAGNOSIS — I158 Other secondary hypertension: Secondary | ICD-10-CM | POA: Diagnosis not present

## 2020-10-10 DIAGNOSIS — E876 Hypokalemia: Secondary | ICD-10-CM | POA: Diagnosis not present

## 2020-10-10 DIAGNOSIS — E1165 Type 2 diabetes mellitus with hyperglycemia: Secondary | ICD-10-CM | POA: Diagnosis not present

## 2020-10-10 LAB — URINE CULTURE: Culture: NO GROWTH

## 2020-10-10 LAB — C DIFFICILE QUICK SCREEN W PCR REFLEX
C Diff antigen: NEGATIVE
C Diff interpretation: NOT DETECTED
C Diff toxin: NEGATIVE

## 2020-10-10 LAB — GLUCOSE, CAPILLARY
Glucose-Capillary: 119 mg/dL — ABNORMAL HIGH (ref 70–99)
Glucose-Capillary: 120 mg/dL — ABNORMAL HIGH (ref 70–99)
Glucose-Capillary: 127 mg/dL — ABNORMAL HIGH (ref 70–99)
Glucose-Capillary: 151 mg/dL — ABNORMAL HIGH (ref 70–99)
Glucose-Capillary: 170 mg/dL — ABNORMAL HIGH (ref 70–99)

## 2020-10-10 LAB — CBC
HCT: 40.4 % (ref 36.0–46.0)
Hemoglobin: 13.3 g/dL (ref 12.0–15.0)
MCH: 27.9 pg (ref 26.0–34.0)
MCHC: 32.9 g/dL (ref 30.0–36.0)
MCV: 84.7 fL (ref 80.0–100.0)
Platelets: 379 10*3/uL (ref 150–400)
RBC: 4.77 MIL/uL (ref 3.87–5.11)
RDW: 13.9 % (ref 11.5–15.5)
WBC: 16.5 10*3/uL — ABNORMAL HIGH (ref 4.0–10.5)
nRBC: 0 % (ref 0.0–0.2)

## 2020-10-10 LAB — BASIC METABOLIC PANEL
Anion gap: 10 (ref 5–15)
BUN: 6 mg/dL — ABNORMAL LOW (ref 8–23)
CO2: 22 mmol/L (ref 22–32)
Calcium: 9 mg/dL (ref 8.9–10.3)
Chloride: 105 mmol/L (ref 98–111)
Creatinine, Ser: 0.75 mg/dL (ref 0.44–1.00)
GFR, Estimated: >60 mL/min (ref >60)
Glucose, Bld: 159 mg/dL — ABNORMAL HIGH (ref 70–99)
Potassium: 3.1 mmol/L — ABNORMAL LOW (ref 3.5–5.1)
Sodium: 137 mmol/L (ref 135–145)

## 2020-10-10 LAB — HEMOGLOBIN A1C
Hgb A1c MFr Bld: 6.5 % — ABNORMAL HIGH (ref 4.8–5.6)
Mean Plasma Glucose: 140 mg/dL

## 2020-10-10 LAB — HIV ANTIBODY (ROUTINE TESTING W REFLEX): HIV Screen 4th Generation wRfx: NONREACTIVE

## 2020-10-10 NOTE — Progress Notes (Signed)
   10/10/20 0618  Assess: MEWS Score  Temp 98.3 F (36.8 C)  BP (!) 202/94  Pulse Rate (!) 105  Resp 20  SpO2 98 %  O2 Device Room Air  Assess: MEWS Score  MEWS Temp 0  MEWS Systolic 2  MEWS Pulse 1  MEWS RR 0  MEWS LOC 0  MEWS Score 3  MEWS Score Color Yellow  Assess: if the MEWS score is Yellow or Red  Were vital signs taken at a resting state? Yes  Focused Assessment Change from prior assessment (see assessment flowsheet)  Early Detection of Sepsis Score *See Row Information* High  MEWS guidelines implemented *See Row Information* Yes  Treat  MEWS Interventions Administered prn meds/treatments  Pain Scale 0-10  Pain Score 0  Complains of Nausea /  Vomiting  Nausea relieved by Antiemetic  Take Vital Signs  Increase Vital Sign Frequency  Yellow: Q 2hr X 2 then Q 4hr X 2, if remains yellow, continue Q 4hrs  Escalate  MEWS: Escalate Yellow: discuss with charge nurse/RN and consider discussing with provider and RRT  Notify: Charge Nurse/RN  Name of Charge Nurse/RN Notified Emman, RN  Date Charge Nurse/RN Notified 10/10/20  Time Charge Nurse/RN Notified 0630  Document  Patient Outcome Not stable and remains on department  Progress note created (see row info) Yes

## 2020-10-10 NOTE — Progress Notes (Signed)
PROGRESS NOTE    [name]  FYT:[id number] DOB: 06-01-1956 DOA: 10/09/2020 PCP: [name], NP   Brief Narrative: [name] is a 65 y.o. female with a history of TIA, hypertension, hyperlipidemia, diabetes mellitus.  Patient presented secondary to nausea, vomiting, diarrhea, abdominal pain with concern for possible gastroenteritis.   Assessment & Plan:   Principal Problem:   Enteritis of infectious origin Active Problems:   Hypokalemia   Essential hypertension   Hyperlipidemia   Diabetes mellitus type 2 in nonobese [facility])   Gastroenteritis Patient met sepsis criteria on admission.  CT abdomen pelvis was significant for no acute findings.  No colitis noted.  No diverticulitis noted.  Symptomatic management initiated on admission in addition to Zosyn IV. -Continue IV fluids -Clear liquid diet with advancing as tolerated -Continue Zosyn for now  Hypokalemia Secondary to GI losses.  Patient started on potassium replacement fluids. -Continue IV fluids with potassium. -BMP in a.m.  Primary hypertension -Continue amlodipine, HCTZ, losartan  Diabetes mellitus, type II Hemoglobin A1c obtained on admission and is 6.5%.  Patient is currently on outpatient regimen. -SSI while inpatient -Continue diet modification as an outpatient   DVT prophylaxis: Lovenox Code Status:   Code Status: Full Code Family Communication: None at bedside Disposition Plan: Discharge home likely in 1 to 2 days pending improvement of nausea, vomiting, diarrhea in addition to ability to tolerate oral diet and transition off of IV antibiotics.   Consultants:   None  Procedures:   None  Antimicrobials:  Zosyn IV   Subjective: Patient reports feeling malaise.  Nausea and vomiting have improved.  Diarrhea is also improved.  Still nauseated.  Objective: Vitals:   10/10/20 0012 10/10/20 0618 10/10/20 0800 10/10/20 0838  BP: (!) 195/116 (!) 202/94  (!) 173/81  Pulse:  (!) 106 (!) 105 100 97  Resp: 20 20    Temp: 98.7 F (37.1 C) 98.3 F (36.8 C)    TempSrc: Oral Oral    SpO2: 98% 98% 99% (!) 81%  Weight:      Height:        Intake/Output Summary (Last 24 hours) at 10/10/2020 1132 Last data filed at 10/10/2020 0630 Gross per 24 hour  Intake 1849.49 ml  Output 800 ml  Net 1049.49 ml   Filed Weights   10/09/20 0425 10/09/20 1552  Weight: 52.6 kg 50.9 kg    Examination:  General exam: Appears calm and comfortable Respiratory system: Clear to auscultation. Respiratory effort normal. Cardiovascular system: S1 & S2 heard, slight tachycardia with normal rhythm. No murmurs, rubs, gallops or clicks. Gastrointestinal system: Abdomen is nondistended, soft and nontender. No organomegaly or masses felt. Normal bowel sounds heard. Central nervous system: Alert and oriented. No focal neurological deficits. Musculoskeletal: No edema. No calf tenderness Skin: No cyanosis. No rashes Psychiatry: Judgement and insight appear normal. Mood & affect appropriate.     Data Reviewed: I have personally reviewed following labs and imaging studies  CBC Lab Results  Component Value Date   WBC 16.5 (H) 10/10/2020   RBC 4.77 10/10/2020   HGB 13.3 10/10/2020   HCT 40.4 10/10/2020   MCV 84.7 10/10/2020   MCH 27.9 10/10/2020   PLT 379 10/10/2020   MCHC 32.9 10/10/2020   RDW 13.9 10/10/2020   LYMPHSABS 2.5 10/09/2020   MONOABS 0.5 10/09/2020   EOSABS 0.1 10/09/2020   BASOSABS 0.1 17/71/1657     Last metabolic panel Lab Results  Component Value Date   NA 137 10/10/2020  K 3.1 (L) 10/10/2020   CL 105 10/10/2020   CO2 22 10/10/2020   BUN 6 (L) 10/10/2020   CREATININE 0.75 10/10/2020   GLUCOSE 159 (H) 10/10/2020   GFRNONAA >60 10/10/2020   GFRAA 109 05/27/2020   CALCIUM 9.0 10/10/2020   PHOS 3.8 06/22/2017   PROT 8.2 (H) 10/09/2020   ALBUMIN 4.4 10/09/2020   LABGLOB 2.8 05/27/2020   AGRATIO 1.6 05/27/2020   BILITOT 0.5 10/09/2020   ALKPHOS 102  10/09/2020   AST 18 10/09/2020   ALT 14 10/09/2020   ANIONGAP 10 10/10/2020    CBG (last 3)  Recent Labs    10/09/20 1644 10/10/20 0014 10/10/20 0814  GLUCAP 132* 170* 151*     GFR: Estimated Creatinine Clearance: 52.2 mL/min (by C-G formula based on SCr of 0.75 mg/dL).  Coagulation Profile: No results for input(s): INR, PROTIME in the last 168 hours.  Recent Results (from the past 240 hour(s))  Resp Panel by RT-PCR (Flu A&B, Covid) Nasopharyngeal Swab     Status: None   Collection Time: 10/09/20  4:55 AM   Specimen: Nasopharyngeal Swab; Nasopharyngeal(NP) swabs in vial transport medium  Result Value Ref Range Status   SARS Coronavirus 2 by RT PCR NEGATIVE NEGATIVE Final    Comment: (NOTE) SARS-CoV-2 target nucleic acids are NOT DETECTED.  The SARS-CoV-2 RNA is generally detectable in upper respiratory specimens during the acute phase of infection. The lowest concentration of SARS-CoV-2 viral copies this assay can detect is 138 copies/mL. A negative result does not preclude SARS-Cov-2 infection and should not be used as the sole basis for treatment or other patient management decisions. A negative result may occur with  improper specimen collection/handling, submission of specimen other than nasopharyngeal swab, presence of viral mutation(s) within the areas targeted by this assay, and inadequate number of viral copies(<138 copies/mL). A negative result must be combined with clinical observations, patient history, and epidemiological information. The expected result is Negative.  Fact Sheet for Patients:  EntrepreneurPulse.com.au  Fact Sheet for Healthcare Providers:  IncredibleEmployment.be  This test is no t yet approved or cleared by the [place] FDA and  has been authorized for detection and/or diagnosis of SARS-CoV-2 by FDA under an Emergency Use Authorization (EUA). This EUA will remain  in effect (meaning this test can  be used) for the duration of the COVID-19 declaration under Section 564(b)(1) of the Act, 21 U.S.C.section 360bbb-3(b)(1), unless the authorization is terminated  or revoked sooner.       Influenza A by PCR NEGATIVE NEGATIVE Final   Influenza B by PCR NEGATIVE NEGATIVE Final    Comment: (NOTE) The Xpert Xpress SARS-CoV-2/FLU/RSV plus assay is intended as an aid in the diagnosis of influenza from Nasopharyngeal swab specimens and should not be used as a sole basis for treatment. Nasal washings and aspirates are unacceptable for Xpert Xpress SARS-CoV-2/FLU/RSV testing.  Fact Sheet for Patients: EntrepreneurPulse.com.au  Fact Sheet for Healthcare Providers: IncredibleEmployment.be  This test is not yet approved or cleared by the [place] FDA and has been authorized for detection and/or diagnosis of SARS-CoV-2 by FDA under an Emergency Use Authorization (EUA). This EUA will remain in effect (meaning this test can be used) for the duration of the COVID-19 declaration under Section 564(b)(1) of the Act, 21 U.S.C. section 360bbb-3(b)(1), unless the authorization is terminated or revoked.  Performed at [facility], [place] [address], [place], [place] [address]   Blood culture (routine x 2)     Status: None (  Preliminary result)   Collection Time: 10/09/20  7:16 AM   Specimen: BLOOD  Result Value Ref Range Status   Specimen Description BLOOD LEFT ANTECUBITAL  Final   Special Requests   Final    BOTTLES DRAWN AEROBIC ONLY Blood Culture results may not be optimal due to an inadequate volume of blood received in culture bottles   Culture   Final    NO GROWTH < 24 HOURS Performed at [facility], [address], [place], [place] [address]    Report Status PENDING  Incomplete  Blood culture (routine x 2)     Status: None (Preliminary result)   Collection Time: 10/09/20  7:21 AM   Specimen: BLOOD  Result Value Ref Range Status    Specimen Description BLOOD RIGHT ANTECUBITAL  Final   Special Requests   Final    BOTTLES DRAWN AEROBIC AND ANAEROBIC Blood Culture results may not be optimal due to an inadequate volume of blood received in culture bottles   Culture   Final    NO GROWTH < 24 HOURS Performed at [facility], [place] [address] [place], [place] [address]    Report Status PENDING  Incomplete  C Difficile Quick Screen w PCR reflex     Status: None   Collection Time: 10/10/20  5:03 AM   Specimen: STOOL  Result Value Ref Range Status   C Diff antigen NEGATIVE NEGATIVE Final   C Diff toxin NEGATIVE NEGATIVE Final   C Diff interpretation No C. difficile detected.  Final    Comment: Performed at [facility], [place] [address], [place], [place] [address]        Radiology Studies: DG Chest 2 View  Result Date: 10/09/2020 CLINICAL DATA:  Shortness of breath EXAM: CHEST - 2 VIEW COMPARISON:  05/14/2019 FINDINGS: Normal heart size and mediastinal contours. There is no edema, consolidation, effusion, or pneumothorax. Bilateral nipple shadows. No acute osseous finding. IMPRESSION: No evidence of active disease. Electronically Signed   By: Monte Fantasia M.D.   On: 10/09/2020 05:57   CT ABDOMEN PELVIS W CONTRAST  Result Date: 10/09/2020 CLINICAL DATA:  Abdominal pain and fever with emesis EXAM: CT ABDOMEN AND PELVIS WITH CONTRAST TECHNIQUE: Multidetector CT imaging of the abdomen and pelvis was performed using the standard protocol following bolus administration of intravenous contrast. CONTRAST:  130m OMNIPAQUE IOHEXOL 300 MG/ML  SOLN COMPARISON:  08/29/2019 FINDINGS: Lower chest:  Mild scarring at the lingula. Hepatobiliary: No focal liver abnormality.Cholecystectomy. No bile duct dilatation. Pancreas: Unremarkable. Spleen: Unremarkable. Adrenals/Urinary Tract: Negative adrenals. Right pelviectasis without hydronephrosis. 17 mm cyst at the upper pole left kidney. Additional smaller presumed cysts in the  bilateral renal cortex. Unremarkable bladder. Stomach/Bowel: No obstruction. No appendicitis. Multiple colonic diverticula. Vascular/Lymphatic: No acute vascular abnormality. No mass or adenopathy. Reproductive:No acute finding. Probable posterior intramural fibroid measuring 17 mm on reformats. Other: No ascites or pneumoperitoneum. Ventral abdominal wall scarring presumably associated with the hernia repair. Musculoskeletal: No acute abnormalities. Spondylosis and facet spurring. Degenerative L4-5 anterolisthesis IMPRESSION: 1. No acute finding.  No bowel obstruction or visible inflammation. 2. Multiple colonic diverticula. Electronically Signed   By: JMonte FantasiaM.D.   On: 10/09/2020 08:58        Scheduled Meds: . acetaminophen  650 mg Oral Once  . amLODipine  10 mg Oral Daily  . enoxaparin (LOVENOX) injection  40 mg Subcutaneous Q24H  . losartan  100 mg Oral Daily   And  . hydrochlorothiazide  25 mg Oral Daily  . insulin aspart  0-15 Units Subcutaneous TID WC  . sodium chloride flush  3 mL Intravenous Q12H   Continuous Infusions: . 0.9 % NaCl with KCl 40 mEq / L 100 mL/hr at 10/09/20 1915  . piperacillin-tazobactam (ZOSYN)  IV 3.375 g (10/10/20 0630)  . promethazine (PHENERGAN) injection (IM or IVPB) 12.5 mg (10/09/20 2216)     LOS: 0 days     Cordelia Poche, MD [facility] 10/10/2020, 11:32 AM  If 7PM-7AM, please contact night-coverage [email or web address]

## 2020-10-11 LAB — GASTROINTESTINAL PANEL BY PCR, STOOL (REPLACES STOOL CULTURE)

## 2020-10-11 LAB — CBC
HCT: 40.3 % (ref 36.0–46.0)
Hemoglobin: 13.2 g/dL (ref 12.0–15.0)
MCH: 27.4 pg (ref 26.0–34.0)
MCHC: 32.8 g/dL (ref 30.0–36.0)
MCV: 83.8 fL (ref 80.0–100.0)
Platelets: 387 10*3/uL (ref 150–400)
RBC: 4.81 MIL/uL (ref 3.87–5.11)
RDW: 14 % (ref 11.5–15.5)
WBC: 16.9 10*3/uL — ABNORMAL HIGH (ref 4.0–10.5)
nRBC: 0 % (ref 0.0–0.2)

## 2020-10-11 LAB — BASIC METABOLIC PANEL
Anion gap: 10 (ref 5–15)
BUN: 8 mg/dL (ref 8–23)
CO2: 22 mmol/L (ref 22–32)
Calcium: 9.3 mg/dL (ref 8.9–10.3)
Chloride: 104 mmol/L (ref 98–111)
Creatinine, Ser: 0.64 mg/dL (ref 0.44–1.00)
GFR, Estimated: >60 mL/min (ref >60)
Glucose, Bld: 165 mg/dL — ABNORMAL HIGH (ref 70–99)
Potassium: 3.2 mmol/L — ABNORMAL LOW (ref 3.5–5.1)
Sodium: 136 mmol/L (ref 135–145)

## 2020-10-11 LAB — GLUCOSE, CAPILLARY
Glucose-Capillary: 156 mg/dL — ABNORMAL HIGH (ref 70–99)
Glucose-Capillary: 90 mg/dL (ref 70–99)
Glucose-Capillary: 97 mg/dL (ref 70–99)
Glucose-Capillary: 112 mg/dL — ABNORMAL HIGH (ref 70–99)

## 2020-10-11 MED ORDER — PROCHLORPERAZINE EDISYLATE 10 MG/2ML IJ SOLN
5.0000 mg | Freq: Four times a day (QID) | INTRAMUSCULAR | Status: DC | PRN
  Administered 2020-10-11 – 2020-10-12 (×3): 5 mg via INTRAVENOUS
  Filled 2020-10-11 (×4): qty 2

## 2020-10-11 MED ORDER — POTASSIUM CHLORIDE CRYS ER 20 MEQ PO TBCR
40.0000 meq | EXTENDED_RELEASE_TABLET | Freq: Once | ORAL | Status: AC
  Administered 2020-10-11: 40 meq via ORAL
  Filled 2020-10-11: qty 2

## 2020-10-11 NOTE — Plan of Care (Signed)

## 2020-10-11 NOTE — Progress Notes (Signed)
0136 BP now 176/90.    0143 Patient woke up nauseated and vomiting adm 12.5mg  Phenergan IV as per MD orders.   0215  Patient states her nausea is not as bad.   0306 Patient vomiting again.  Administered Zofran 4mg  IV.

## 2020-10-11 NOTE — Progress Notes (Signed)
Paged [facility]spitalist.  Patient vomiting again.  Patient has had phenergan and zofran.   Awaiting call

## 2020-10-11 NOTE — Progress Notes (Signed)
Administered 5mg  Hydralazine IV as per MD order.  Patient's BP 213/106

## 2020-10-11 NOTE — Progress Notes (Signed)
0355  Administered compazine 5 mg  0430 Patient resting quietly with eyes closed.  Denies nausea.

## 2020-10-11 NOTE — Progress Notes (Signed)
PROGRESS NOTE    [name]  ENI:[id number] DOB: 23-Mar-1956 DOA: 10/09/2020 PCP: [name], NP   Brief Narrative: [name] is a 65 y.o. female with a history of TIA, hypertension, hyperlipidemia, diabetes mellitus.  Patient presented secondary to nausea, vomiting, diarrhea, abdominal pain with concern for possible gastroenteritis.   Assessment & Plan:   Principal Problem:   Enteritis of infectious origin Active Problems:   Hypokalemia   Essential hypertension   Hyperlipidemia   Diabetes mellitus type 2 in nonobese (HCC)   Sepsis due to undetermined organism [facility])   Gastroenteritis Patient met sepsis criteria on admission.  CT abdomen pelvis was significant for no acute findings.  No colitis noted.  No diverticulitis noted.  Symptomatic management initiated on admission in addition to Zosyn IV. C. Difficile negative. -Continue IV fluids -Clear liquid diet with advancing as tolerated -Continue Zosyn for now -Follow-up GI pathogen panel  Hypokalemia Secondary to GI losses.  Patient started on potassium replacement fluids. -Continue IV fluids with potassium. -BMP in a.m. -Kdur PO x1  Primary hypertension -Continue amlodipine, HCTZ, losartan  Diabetes mellitus, type II Hemoglobin A1c obtained on admission and is 6.5%.  Patient is currently on outpatient regimen. -SSI while inpatient -Continue diet modification as an outpatient   DVT prophylaxis: Lovenox Code Status:   Code Status: Full Code Family Communication: None at bedside Disposition Plan: Discharge home likely in 1 to 2 days pending improvement of nausea, vomiting, diarrhea in addition to ability to tolerate oral diet and transition off of IV antibiotics.   Consultants:   None  Procedures:   None  Antimicrobials:  Zosyn IV   Subjective: Initially felt better but had recurrent nausea with vomiting overnight. Some mild abdominal pain. Previously had a headache but no current  headache.  Objective: Vitals:   10/11/20 0136 10/11/20 0544 10/11/20 0657 10/11/20 0957  BP: (!) 176/90 (!) 174/96 (!) 176/98 (!) 150/90  Pulse: 96 94 (!) 107 (!) 107  Resp: _0 Temp: 98.3 F (36.8 C) 98.2 F (36.8 C)  98.6 F (37 C)  TempSrc: Oral Oral    SpO2: 100% 100%  98%  Weight:      Height:        Intake/Output Summary (Last 24 hours) at 10/11/2020 1233 Last data filed at 10/11/2020 0600 Gross per 24 hour  Intake 2353.2 ml  Output 400 ml  Net 1953.2 ml   Filed Weights   10/09/20 0425 10/09/20 1552  Weight: 52.6 kg 50.9 kg    Examination:  General exam: Appears calm and comfortable Respiratory system: Clear to auscultation. Respiratory effort normal. Cardiovascular system: S1 & S2 heard, RRR. No murmurs, rubs, gallops or clicks. Gastrointestinal system: Abdomen is nondistended, soft and mildly generally tender. No organomegaly or masses felt. Normal bowel sounds heard. Central nervous system: Alert and oriented. No focal neurological deficits. Musculoskeletal: No edema. No calf tenderness Skin: No cyanosis. No rashes Psychiatry: Judgement and insight appear normal. Mood & affect appropriate.     Data Reviewed: I have personally reviewed following labs and imaging studies  CBC Lab Results  Component Value Date   WBC 16.9 (H) 10/11/2020   RBC 4.81 10/11/2020   HGB 13.2 10/11/2020   HCT 40.3 10/11/2020   MCV 83.8 10/11/2020   MCH 27.4 10/11/2020   PLT 387 10/11/2020   MCHC 32.8 10/11/2020   RDW 14.0 10/11/2020   LYMPHSABS 2.5 10/09/2020   MONOABS 0.5 10/09/2020   EOSABS 0.1 10/09/2020  BASOSABS 0.1 09/60/4540     Last metabolic panel Lab Results  Component Value Date   NA 136 10/11/2020   K 3.2 (L) 10/11/2020   CL 104 10/11/2020   CO2 22 10/11/2020   BUN 8 10/11/2020   CREATININE 0.64 10/11/2020   GLUCOSE 165 (H) 10/11/2020   GFRNONAA >60 10/11/2020   GFRAA 109 05/27/2020   CALCIUM 9.3 10/11/2020   PHOS 3.8 06/22/2017   PROT  8.2 (H) 10/09/2020   ALBUMIN 4.4 10/09/2020   LABGLOB 2.8 05/27/2020   AGRATIO 1.6 05/27/2020   BILITOT 0.5 10/09/2020   ALKPHOS 102 10/09/2020   AST 18 10/09/2020   ALT 14 10/09/2020   ANIONGAP 10 10/11/2020    CBG (last 3)  Recent Labs    10/10/20 2051 10/11/20 0643 10/11/20 1152  GLUCAP 120* 156* 90     GFR: Estimated Creatinine Clearance: 52.2 mL/min (by C-G formula based on SCr of 0.64 mg/dL).  Coagulation Profile: No results for input(s): INR, PROTIME in the last 168 hours.  Recent Results (from the past 240 hour(s))  Resp Panel by RT-PCR (Flu A&B, Covid) Nasopharyngeal Swab     Status: None   Collection Time: 10/09/20  4:55 AM   Specimen: Nasopharyngeal Swab; Nasopharyngeal(NP) swabs in vial transport medium  Result Value Ref Range Status   SARS Coronavirus 2 by RT PCR NEGATIVE NEGATIVE Final    Comment: (NOTE) SARS-CoV-2 target nucleic acids are NOT DETECTED.  The SARS-CoV-2 RNA is generally detectable in upper respiratory specimens during the acute phase of infection. The lowest concentration of SARS-CoV-2 viral copies this assay can detect is 138 copies/mL. A negative result does not preclude SARS-Cov-2 infection and should not be used as the sole basis for treatment or other patient management decisions. A negative result may occur with  improper specimen collection/handling, submission of specimen other than nasopharyngeal swab, presence of viral mutation(s) within the areas targeted by this assay, and inadequate number of viral copies(<138 copies/mL). A negative result must be combined with clinical observations, patient history, and epidemiological information. The expected result is Negative.  Fact Sheet for Patients:  EntrepreneurPulse.com.au  Fact Sheet for Healthcare Providers:  IncredibleEmployment.be  This test is no t yet approved or cleared by the [place] FDA and  has been authorized for detection  and/or diagnosis of SARS-CoV-2 by FDA under an Emergency Use Authorization (EUA). This EUA will remain  in effect (meaning this test can be used) for the duration of the COVID-19 declaration under Section 564(b)(1) of the Act, 21 U.S.C.section 360bbb-3(b)(1), unless the authorization is terminated  or revoked sooner.       Influenza A by PCR NEGATIVE NEGATIVE Final   Influenza B by PCR NEGATIVE NEGATIVE Final    Comment: (NOTE) The Xpert Xpress SARS-CoV-2/FLU/RSV plus assay is intended as an aid in the diagnosis of influenza from Nasopharyngeal swab specimens and should not be used as a sole basis for treatment. Nasal washings and aspirates are unacceptable for Xpert Xpress SARS-CoV-2/FLU/RSV testing.  Fact Sheet for Patients: EntrepreneurPulse.com.au  Fact Sheet for Healthcare Providers: IncredibleEmployment.be  This test is not yet approved or cleared by the [place] FDA and has been authorized for detection and/or diagnosis of SARS-CoV-2 by FDA under an Emergency Use Authorization (EUA). This EUA will remain in effect (meaning this test can be used) for the duration of the COVID-19 declaration under Section 564(b)(1) of the Act, 21 U.S.C. section 360bbb-3(b)(1), unless the authorization is terminated or revoked.  Performed at [facility], [place] [address], [place], [place] [address]   Blood culture (routine x 2)     Status: None (Preliminary result)   Collection Time: 10/09/20  7:16 AM   Specimen: BLOOD  Result Value Ref Range Status   Specimen Description BLOOD LEFT ANTECUBITAL  Final   Special Requests   Final    BOTTLES DRAWN AEROBIC ONLY Blood Culture results may not be optimal due to an inadequate volume of blood received in culture bottles   Culture   Final    NO GROWTH 2 DAYS Performed at [facility], [place] [address], [place], [place] [address]    Report Status PENDING  Incomplete  Urine culture     Status:  None   Collection Time: 10/09/20  7:19 AM   Specimen: Urine, Random  Result Value Ref Range Status   Specimen Description URINE, RANDOM  Final   Special Requests NONE  Final   Culture   Final    NO GROWTH Performed at [facility], [place] [address] [place], [place] [address]    Report Status 10/10/2020 FINAL  Final  Blood culture (routine x 2)     Status: None (Preliminary result)   Collection Time: 10/09/20  7:21 AM   Specimen: BLOOD  Result Value Ref Range Status   Specimen Description BLOOD RIGHT ANTECUBITAL  Final   Special Requests   Final    BOTTLES DRAWN AEROBIC AND ANAEROBIC Blood Culture results may not be optimal due to an inadequate volume of blood received in culture bottles   Culture   Final    NO GROWTH 2 DAYS Performed at [facility], [place] [address] [place], [place] [address]    Report Status PENDING  Incomplete  C Difficile Quick Screen w PCR reflex     Status: None   Collection Time: 10/10/20  5:03 AM   Specimen: STOOL  Result Value Ref Range Status   C Diff antigen NEGATIVE NEGATIVE Final   C Diff toxin NEGATIVE NEGATIVE Final   C Diff interpretation No C. difficile detected.  Final    Comment: Performed at [facility], [place] [address] [place], [place] [address]        Radiology Studies: No results found.      Scheduled Meds: . acetaminophen  650 mg Oral Once  . amLODipine  10 mg Oral Daily  . enoxaparin (LOVENOX) injection  40 mg Subcutaneous Q24H  . losartan  100 mg Oral Daily   And  . hydrochlorothiazide  25 mg Oral Daily  . insulin aspart  0-15 Units Subcutaneous TID WC  . sodium chloride flush  3 mL Intravenous Q12H   Continuous Infusions: . 0.9 % NaCl with KCl 40 mEq / L 100 mL/hr at 10/11/20 0349  . piperacillin-tazobactam (ZOSYN)  IV 3.375 g (10/11/20 4287)  . promethazine (PHENERGAN) injection (IM or IVPB) 12.5 mg (10/11/20 0143)     LOS: 1 day     Cordelia Poche, MD [facility] 10/11/2020, 12:33  PM  If 7PM-7AM, please contact night-coverage [email or web address]

## 2020-10-12 ENCOUNTER — Inpatient Hospital Stay (HOSPITAL_COMMUNITY): Payer: Medicare HMO

## 2020-10-12 LAB — BASIC METABOLIC PANEL
Anion gap: 15 (ref 5–15)
BUN: 9 mg/dL (ref 8–23)
CO2: 18 mmol/L — ABNORMAL LOW (ref 22–32)
Calcium: 9.3 mg/dL (ref 8.9–10.3)
Chloride: 104 mmol/L (ref 98–111)
Creatinine, Ser: 0.66 mg/dL (ref 0.44–1.00)
GFR, Estimated: >60 mL/min (ref >60)
Glucose, Bld: 134 mg/dL — ABNORMAL HIGH (ref 70–99)
Potassium: 3.3 mmol/L — ABNORMAL LOW (ref 3.5–5.1)
Sodium: 137 mmol/L (ref 135–145)

## 2020-10-12 LAB — CBC
HCT: 40.6 % (ref 36.0–46.0)
Hemoglobin: 13.2 g/dL (ref 12.0–15.0)
MCH: 27.7 pg (ref 26.0–34.0)
MCHC: 32.5 g/dL (ref 30.0–36.0)
MCV: 85.1 fL (ref 80.0–100.0)
Platelets: 366 10*3/uL (ref 150–400)
RBC: 4.77 MIL/uL (ref 3.87–5.11)
RDW: 14.1 % (ref 11.5–15.5)
WBC: 14.3 10*3/uL — ABNORMAL HIGH (ref 4.0–10.5)
nRBC: 0 % (ref 0.0–0.2)

## 2020-10-12 LAB — GLUCOSE, CAPILLARY
Glucose-Capillary: 131 mg/dL — ABNORMAL HIGH (ref 70–99)
Glucose-Capillary: 89 mg/dL (ref 70–99)
Glucose-Capillary: 105 mg/dL — ABNORMAL HIGH (ref 70–99)

## 2020-10-12 MED ORDER — HYDRALAZINE HCL 20 MG/ML IJ SOLN: 10.0000 mg | INTRAMUSCULAR | Status: DC | PRN

## 2020-10-12 MED ORDER — METOCLOPRAMIDE HCL 5 MG/ML IJ SOLN
5.0000 mg | Freq: Three times a day (TID) | INTRAMUSCULAR | Status: DC
  Administered 2020-10-12 – 2020-10-13 (×2): 5 mg via INTRAVENOUS
  Filled 2020-10-12 (×3): qty 2

## 2020-10-12 MED ORDER — PANTOPRAZOLE SODIUM 40 MG PO TBEC
40.0000 mg | DELAYED_RELEASE_TABLET | Freq: Two times a day (BID) | ORAL | Status: DC
  Administered 2020-10-12 – 2020-10-13 (×2): 40 mg via ORAL
  Filled 2020-10-12 (×2): qty 1

## 2020-10-12 NOTE — Plan of Care (Signed)

## 2020-10-12 NOTE — Progress Notes (Signed)
[facility] Progress Note  Patient: [name]    VZD:[id number]  DOA: 10/09/2020     Date of Service: the patient was seen and examined on 10/12/2020  Brief hospital course: Past medical history of HTN, HLD, TIA complaints of nausea vomiting diarrhea and abdominal pain likely secondary to gastroenteritis. Currently plan is monitor for improvement in diet tolerance.  Assessment and Plan: 1.  Gastroenteritis. SIRS sepsis ruled out. Met SIRS criteria on admission.  Currently no evidence of acute infection. Patient is not completely started on IV antibiotics.  We will discontinue.  GI pathogen panel as well as C. difficile negative. Diarrhea has resolved but nausea and vomiting has not. X-ray abdomen on 4/20 negative for any obstruction. Still has persistent leukocytosis. Patient reports intermittent issues with GI tract outpatient, although does not have significant weight loss or other high risk features. Will initiate Reglan Advance diet to full liquid And PPI twice daily Discontinue IV fluids and monitor response  2.  Hypertensive urgency Blood pressure severely elevated on admission. Currently improving but still high. Continue with current regimen. Monitor.  3.  Hypokalemia Currently replacing.  Monitor.  4.  Type 2 diabetes mellitus, controlled without long-term insulin use.  No complication Continue sliding-scale insulin. At risk for gastroparesis in future.  5.  Lactic acidosis From dehydration. Currently resolved. Monitor.  6.  Relative B12 deficiency Seen on December 2021. Will initiate replacement  Diet: Full liquid diet. DVT Prophylaxis:   enoxaparin (LOVENOX) injection 40 mg Start: 10/09/20 1200    Advance goals of care discussion: Full code  Family Communication: family was present at bedside, at the time of interview.  The pt provided permission to discuss medical plan with the family. Opportunity was given to ask question and all  questions were answered satisfactorily.   Disposition:  Status is: Inpatient  Remains inpatient appropriate because:IV treatments appropriate due to intensity of illness or inability to take PO   Dispo: The patient is from: Home              Anticipated d/c is to: Home              Patient currently is not medically stable to d/c.   Difficult to place patient No        Subjective: No nausea no vomiting.  1 episode of vomiting last night without any blood.  Passing gas.  No BM.  No abdominal pain.  Physical Exam:  General: Appear in mild distress, no Rash; Oral Mucosa Clear, moist. no Abnormal Neck Mass Or lumps, Conjunctiva normal  Cardiovascular: S1 and S2 Present, no Murmur, Respiratory: good respiratory effort, Bilateral Air entry present and CTA, no Crackles, no wheezes Abdomen: Bowel Sound present, Soft and no tenderness Extremities: no Pedal edema Neurology: alert and oriented to time, place, and person affect appropriate. no new focal deficit Gait not checked due to patient safety concerns    Vitals:   10/11/20 2027 10/12/20 0649 10/12/20 0931 10/12/20 1644  BP: (!) 180/90 (!) 179/84 (!) 150/82 (!) 181/85  Pulse: 85 96 (!) 101 83  Resp: '20 20 18 18  ' Temp: 98.4 F (36.9 C) 98.8 F (37.1 C) 97.8 F (36.6 C) 97.7 F (36.5 C)  TempSrc: Oral Oral Oral Oral  SpO2: 100% 98% 97% 96%  Weight:      Height:        Intake/Output Summary (Last 24 hours) at 10/12/2020 1924 Last data filed at 10/12/2020 1335 Gross per 24 hour  Intake  1021 ml  Output --  Net 1021 ml   Filed Weights   10/09/20 0425 10/09/20 1552  Weight: 52.6 kg 50.9 kg    Data Reviewed: I have personally reviewed and interpreted daily labs, tele strips, imaging. I reviewed all nursing notes, pharmacy notes, vitals, pertinent old records I have discussed plan of care as described above with RN and patient/family.  CBC: Recent Labs  Lab 10/09/20 0436 10/10/20 0324 10/11/20 0451 10/12/20 0558   WBC 17.3* 16.5* 16.9* 14.3*  NEUTROABS 14.1*  --   --   --   HGB 13.7 13.3 13.2 13.2  HCT 42.3 40.4 40.3 40.6  MCV 86.5 84.7 83.8 85.1  PLT 400 379 387 053   Basic Metabolic Panel: Recent Labs  Lab 10/09/20 0436 10/09/20 1829 10/10/20 0324 10/11/20 0451 10/12/20 0558  NA 138  --  137 136 137  K 2.7*  --  3.1* 3.2* 3.3*  CL 103  --  105 104 104  CO2 24  --  22 22 18*  GLUCOSE 234*  --  159* 165* 134*  BUN 7*  --  6* 8 9  CREATININE 0.68  --  0.75 0.64 0.66  CALCIUM 9.6  --  9.0 9.3 9.3  MG  --  1.9  --   --   --     Studies: DG Abd Portable 1V  Result Date: 10/12/2020 CLINICAL DATA:  Intractable nausea and vomiting in a 65 year old female. EXAM: PORTABLE ABDOMEN - 1 VIEW COMPARISON:  October 09, 2020 FINDINGS: Post cholecystectomy. EKG leads project over the LEFT inferior chest. Scattered gas-filled loops of both large and small bowel throughout the abdomen. Scattered stool throughout the colon. Signs of rectal gas. Uppermost portion of the abdomen excluded from view. No gross organomegaly or sign of abnormal calcification. Vascular calcifications project over the spine and in the pelvis. Regional skeletal structures without acute process on limited assessment. IMPRESSION: Nonobstructive bowel gas pattern. Scattered loops of gas-filled small bowel could reflect developing mild ileus. Electronically Signed   By: Zetta Bills M.D.   On: 10/12/2020 12:03    Scheduled Meds: . amLODipine  10 mg Oral Daily  . enoxaparin (LOVENOX) injection  40 mg Subcutaneous Q24H  . losartan  100 mg Oral Daily  . metoCLOPramide (REGLAN) injection  5 mg Intravenous TID AC  . pantoprazole  40 mg Oral BID AC  . sodium chloride flush  3 mL Intravenous Q12H   Continuous Infusions: PRN Meds: acetaminophen **OR** acetaminophen, hydrALAZINE, HYDROcodone-acetaminophen, morphine injection, ondansetron **OR** ondansetron (ZOFRAN) IV  Time spent: 35 minutes  Author: Berle Mull, MD [facility] 10/12/2020 7:24 PM  To reach On-call, see care teams to locate the attending and reach out via [email or web address]. Between 7PM-7AM, please contact night-coverage If you still have difficulty reaching the attending provider, please page the [facility] (Director on Call) for [facility] on amion for assistance.

## 2020-10-13 ENCOUNTER — Other Ambulatory Visit (HOSPITAL_COMMUNITY): Payer: Self-pay

## 2020-10-13 DIAGNOSIS — A09 Infectious gastroenteritis and colitis, unspecified: Secondary | ICD-10-CM | POA: Diagnosis not present

## 2020-10-13 LAB — BASIC METABOLIC PANEL
Anion gap: 8 (ref 5–15)
BUN: 8 mg/dL (ref 8–23)
CO2: 25 mmol/L (ref 22–32)
Calcium: 9.3 mg/dL (ref 8.9–10.3)
Chloride: 103 mmol/L (ref 98–111)
Creatinine, Ser: 0.65 mg/dL (ref 0.44–1.00)
GFR, Estimated: >60 mL/min (ref >60)
Glucose, Bld: 148 mg/dL — ABNORMAL HIGH (ref 70–99)
Potassium: 3 mmol/L — ABNORMAL LOW (ref 3.5–5.1)
Sodium: 136 mmol/L (ref 135–145)

## 2020-10-13 LAB — MAGNESIUM: Magnesium: 1.8 mg/dL (ref 1.7–2.4)

## 2020-10-13 LAB — GLUCOSE, CAPILLARY: Glucose-Capillary: 157 mg/dL — ABNORMAL HIGH (ref 70–99)

## 2020-10-13 MED ORDER — POTASSIUM CHLORIDE CRYS ER 20 MEQ PO TBCR
40.0000 meq | EXTENDED_RELEASE_TABLET | ORAL | Status: AC
  Administered 2020-10-13 (×2): 40 meq via ORAL
  Filled 2020-10-13 (×2): qty 2

## 2020-10-13 MED ORDER — PANTOPRAZOLE SODIUM 40 MG PO TBEC
40.0000 mg | DELAYED_RELEASE_TABLET | Freq: Two times a day (BID) | ORAL | 0 refills | Status: DC
  Filled 2020-10-13: qty 28, 14d supply, fill #0

## 2020-10-13 NOTE — Progress Notes (Signed)
DISCHARGE NOTE HOME [name] to be discharged Home per MD order. Discussed prescriptions and follow up appointments with the patient. Prescriptions given to patient; medication list explained in detail. Patient verbalized understanding.  Skin clean, dry and intact without evidence of skin break down, no evidence of skin tears noted. IV catheter discontinued intact. Site without signs and symptoms of complications. Dressing and pressure applied. Pt denies pain at the site currently. No complaints noted.  Patient free of lines, drains, and wounds.   An After Visit Summary (AVS) was printed and given to the patient. Patient escorted via wheelchair, and discharged home via private auto.  Berneta Levins, RN

## 2020-10-13 NOTE — Plan of Care (Signed)
  Problem: Activity: Goal: Risk for activity intolerance will decrease Outcome: Progressing   Problem: Nutrition: Goal: Adequate nutrition will be maintained Outcome: Progressing   

## 2020-10-14 ENCOUNTER — Telehealth: Payer: Self-pay

## 2020-10-14 LAB — CULTURE, BLOOD (ROUTINE X 2)
Culture: NO GROWTH
Culture: NO GROWTH

## 2020-10-14 NOTE — Telephone Encounter (Signed)
Transition Care Management Unsuccessful Follow-up Telephone Call  Date of discharge and from where:  [facility] on 10/13/2020  Attempts:  1st Attempt  Reason for unsuccessful TCM follow-up call:  Left voice message to call back this nurse or Opal Sidles, RN CM.

## 2020-10-15 ENCOUNTER — Other Ambulatory Visit: Payer: Self-pay | Admitting: Internal Medicine

## 2020-10-15 MED ORDER — VITAMIN B-12 1000 MCG PO TABS: 1000.0000 ug | ORAL_TABLET | Freq: Every day | ORAL | 0 refills | Status: DC

## 2020-10-15 NOTE — Discharge Summary (Signed)
[facility] Discharge Summary   Patient: [name] [id number]  PCP: [name], NP  Date of admission: 10/09/2020   Date of discharge: 10/13/2020     Discharge Diagnoses:  Principal Problem:   Enteritis of infectious origin Active Problems:   Hypokalemia   Essential hypertension   Hyperlipidemia   Diabetes mellitus type 2 in nonobese ([place])   Sepsis due to undetermined organism [facility])   Admitted From: home Disposition:  Home   Recommendations for Outpatient Follow-up:  1. PCP: please follow up with PCP in 1 week 2. Follow up LABS/TEST:  none 3. New Meds: protonix 4. Changed meds: none 5. Stopped meds: none   Follow-up Information    [name], NP. Schedule an appointment as soon as possible for a visit in 1 week(s).   Specialty: Internal Medicine Contact information: [place] [address] [phone]              Discharge Instructions    Diet - low sodium heart healthy   Complete by: As directed    Increase activity slowly   Complete by: As directed       Diet recommendation: Regular diet  Activity: The patient is advised to gradually reintroduce usual activities, as tolerated  Discharge Condition: stable  Code Status: Full code   History of present illness: As per the H and P dictated on admission, "[name] is a 65 y.o. female with medical history significant of TIA; HTN; HLD; and DM presenting with n/v/d/abd pain.  She started throwing up yesterday.  +diarrhea.  Emesis x 7, diarrhea numerous.  She was found to be hypothermic and was placed on a Retail banker.  She was having pain across her lower abdomen although this is improved now.  No recent antibiotics.  No sick contacts."  Hospital Course:  Summary of her active problems in the hospital is as following.   1.  Gastroenteritis. SIRS, sepsis ruled out. Met SIRS criteria on admission.  Currently no evidence of acute infection. Patient  was started on IV antibiotics.  We will discontinue as not indicated.  GI pathogen panel as well as C. difficile negative. X-ray abdomen on 4/20 negative for any obstruction. Still has persistent leukocytosis. Patient reports intermittent issues with GI tract outpatient, although does not have significant weight loss or other high risk features. Treated with temporary Reglan.  tolerating soft diet.  And PPI twice daily  2.  Hypertensive urgency Blood pressure severely elevated on admission. Currently improving but still high. Continue with current regimen. Monitor.  3.  Hypokalemia Currently replacing.  Monitor.  4.  Type 2 diabetes mellitus, controlled without long-term insulin use.  No complication At risk for gastroparesis in future.  5.  Lactic acidosis From dehydration. Currently resolved. Monitor.  6.  Relative B12 deficiency Seen on December 2021. Level 388 Will initiate replacement  Patient was ambulatory without any assistance. On the day of the discharge the patient's vitals were stable, and no other acute medical condition were reported by patient. The patient was felt safe to be discharge at Home with no therapy needed on discharge.  Consultants: none Procedures: none  DISCHARGE MEDICATION: Allergies as of 10/13/2020   No Known Allergies     Medication List    TAKE these medications   amLODipine 10 MG tablet Commonly known as: NORVASC Take 1 tablet (10 mg total) by mouth daily.   ibuprofen 600 MG tablet Commonly known as: ADVIL Take 1 tablet (600  mg total) by mouth every 8 (eight) hours as needed.   losartan-hydrochlorothiazide 100-25 MG tablet Commonly known as: HYZAAR Take 1 tablet by mouth daily.   pantoprazole 40 MG tablet Commonly known as: PROTONIX Take 1 tablet (40 mg total) by mouth 2 (two) times daily before a meal for 14 days.   tiZANidine 2 MG tablet Commonly known as: ZANAFLEX Take 1 tablet (2 mg total) by mouth every 6 (six)  hours as needed for muscle spasms.       Discharge Exam: Filed Weights   10/09/20 0425 10/09/20 1552  Weight: 52.6 kg 50.9 kg   Vitals:   10/13/20 0519 10/13/20 0814  BP: (!) 159/89 130/85  Pulse: 87 96  Resp: 18 18  Temp: 98.5 F (36.9 C) 98.2 F (36.8 C)  SpO2: 100% 98%   General: Appear in mild distress, no Rash; Oral Mucosa Clear, moist. no Abnormal Neck Mass Or lumps, Conjunctiva normal  Cardiovascular: S1 and S2 Present, no Murmur, Respiratory: good respiratory effort, Bilateral Air entry present and CTA, no Crackles, no wheezes Abdomen: Bowel Sound present, Soft and no tenderness Extremities: no Pedal edema Neurology: alert and oriented to time, place, and person affect appropriate. no new focal deficit Gait not checked due to patient safety concerns    The results of significant diagnostics from this hospitalization (including imaging, microbiology, ancillary and laboratory) are listed below for reference.    Significant Diagnostic Studies: DG Chest 2 View  Result Date: 10/09/2020 CLINICAL DATA:  Shortness of breath EXAM: CHEST - 2 VIEW COMPARISON:  05/14/2019 FINDINGS: Normal heart size and mediastinal contours. There is no edema, consolidation, effusion, or pneumothorax. Bilateral nipple shadows. No acute osseous finding. IMPRESSION: No evidence of active disease. Electronically Signed   By: Monte Fantasia M.D.   On: 10/09/2020 05:57   CT ABDOMEN PELVIS W CONTRAST  Result Date: 10/09/2020 CLINICAL DATA:  Abdominal pain and fever with emesis EXAM: CT ABDOMEN AND PELVIS WITH CONTRAST TECHNIQUE: Multidetector CT imaging of the abdomen and pelvis was performed using the standard protocol following bolus administration of intravenous contrast. CONTRAST:  167m OMNIPAQUE IOHEXOL 300 MG/ML  SOLN COMPARISON:  08/29/2019 FINDINGS: Lower chest:  Mild scarring at the lingula. Hepatobiliary: No focal liver abnormality.Cholecystectomy. No bile duct dilatation. Pancreas:  Unremarkable. Spleen: Unremarkable. Adrenals/Urinary Tract: Negative adrenals. Right pelviectasis without hydronephrosis. 17 mm cyst at the upper pole left kidney. Additional smaller presumed cysts in the bilateral renal cortex. Unremarkable bladder. Stomach/Bowel: No obstruction. No appendicitis. Multiple colonic diverticula. Vascular/Lymphatic: No acute vascular abnormality. No mass or adenopathy. Reproductive:No acute finding. Probable posterior intramural fibroid measuring 17 mm on reformats. Other: No ascites or pneumoperitoneum. Ventral abdominal wall scarring presumably associated with the hernia repair. Musculoskeletal: No acute abnormalities. Spondylosis and facet spurring. Degenerative L4-5 anterolisthesis IMPRESSION: 1. No acute finding.  No bowel obstruction or visible inflammation. 2. Multiple colonic diverticula. Electronically Signed   By: JMonte FantasiaM.D.   On: 10/09/2020 08:58   DG Abd Portable 1V  Result Date: 10/12/2020 CLINICAL DATA:  Intractable nausea and vomiting in a 65year old female. EXAM: PORTABLE ABDOMEN - 1 VIEW COMPARISON:  October 09, 2020 FINDINGS: Post cholecystectomy. EKG leads project over the LEFT inferior chest. Scattered gas-filled loops of both large and small bowel throughout the abdomen. Scattered stool throughout the colon. Signs of rectal gas. Uppermost portion of the abdomen excluded from view. No gross organomegaly or sign of abnormal calcification. Vascular calcifications project over the spine and in the pelvis. Regional skeletal structures without  acute process on limited assessment. IMPRESSION: Nonobstructive bowel gas pattern. Scattered loops of gas-filled small bowel could reflect developing mild ileus. Electronically Signed   By: Zetta Bills M.D.   On: 10/12/2020 12:03    Microbiology: Recent Results (from the past 240 hour(s))  Resp Panel by RT-PCR (Flu A&B, Covid) Nasopharyngeal Swab     Status: None   Collection Time: 10/09/20  4:55 AM    Specimen: Nasopharyngeal Swab; Nasopharyngeal(NP) swabs in vial transport medium  Result Value Ref Range Status   SARS Coronavirus 2 by RT PCR NEGATIVE NEGATIVE Final    Comment: (NOTE) SARS-CoV-2 target nucleic acids are NOT DETECTED.  The SARS-CoV-2 RNA is generally detectable in upper respiratory specimens during the acute phase of infection. The lowest concentration of SARS-CoV-2 viral copies this assay can detect is 138 copies/mL. A negative result does not preclude SARS-Cov-2 infection and should not be used as the sole basis for treatment or other patient management decisions. A negative result may occur with  improper specimen collection/handling, submission of specimen other than nasopharyngeal swab, presence of viral mutation(s) within the areas targeted by this assay, and inadequate number of viral copies(<138 copies/mL). A negative result must be combined with clinical observations, patient history, and epidemiological information. The expected result is Negative.  Fact Sheet for Patients:  EntrepreneurPulse.com.au  Fact Sheet for Healthcare Providers:  IncredibleEmployment.be  This test is no t yet approved or cleared by the [place] FDA and  has been authorized for detection and/or diagnosis of SARS-CoV-2 by FDA under an Emergency Use Authorization (EUA). This EUA will remain  in effect (meaning this test can be used) for the duration of the COVID-19 declaration under Section 564(b)(1) of the Act, 21 U.S.C.section 360bbb-3(b)(1), unless the authorization is terminated  or revoked sooner.       Influenza A by PCR NEGATIVE NEGATIVE Final   Influenza B by PCR NEGATIVE NEGATIVE Final    Comment: (NOTE) The Xpert Xpress SARS-CoV-2/FLU/RSV plus assay is intended as an aid in the diagnosis of influenza from Nasopharyngeal swab specimens and should not be used as a sole basis for treatment. Nasal washings and aspirates are  unacceptable for Xpert Xpress SARS-CoV-2/FLU/RSV testing.  Fact Sheet for Patients: EntrepreneurPulse.com.au  Fact Sheet for Healthcare Providers: IncredibleEmployment.be  This test is not yet approved or cleared by the [place] FDA and has been authorized for detection and/or diagnosis of SARS-CoV-2 by FDA under an Emergency Use Authorization (EUA). This EUA will remain in effect (meaning this test can be used) for the duration of the COVID-19 declaration under Section 564(b)(1) of the Act, 21 U.S.C. section 360bbb-3(b)(1), unless the authorization is terminated or revoked.  Performed at [facility], [place] [address], [place], [place] [address]   Blood culture (routine x 2)     Status: None   Collection Time: 10/09/20  7:16 AM   Specimen: BLOOD  Result Value Ref Range Status   Specimen Description BLOOD LEFT ANTECUBITAL  Final   Special Requests   Final    BOTTLES DRAWN AEROBIC ONLY Blood Culture results may not be optimal due to an inadequate volume of blood received in culture bottles   Culture   Final    NO GROWTH 5 DAYS Performed at [facility], [place] [address] [place],  [address]    Report Status 10/14/2020 FINAL  Final  Urine culture     Status: None   Collection Time: 10/09/20  7:19 AM   Specimen: Urine,  Random  Result Value Ref Range Status   Specimen Description URINE, RANDOM  Final   Special Requests NONE  Final   Culture   Final    NO GROWTH Performed at [facility], [address], [place], [place] [address]    Report Status 10/10/2020 FINAL  Final  Blood culture (routine x 2)     Status: None   Collection Time: 10/09/20  7:21 AM   Specimen: BLOOD  Result Value Ref Range Status   Specimen Description BLOOD RIGHT ANTECUBITAL  Final   Special Requests   Final    BOTTLES DRAWN AEROBIC AND ANAEROBIC Blood Culture results may not be optimal due to an inadequate volume of blood received in  culture bottles   Culture   Final    NO GROWTH 5 DAYS Performed at [facility], [place] [address] [place], [place] [address]    Report Status 10/14/2020 FINAL  Final  Gastrointestinal Panel by PCR , Stool     Status: None   Collection Time: 10/09/20  9:56 AM   Specimen: Stool  Result Value Ref Range Status   Campylobacter species NOT DETECTED NOT DETECTED Final   Plesimonas shigelloides NOT DETECTED NOT DETECTED Final   Salmonella species NOT DETECTED NOT DETECTED Final   Yersinia enterocolitica NOT DETECTED NOT DETECTED Final   Vibrio species NOT DETECTED NOT DETECTED Final   Vibrio cholerae NOT DETECTED NOT DETECTED Final   Enteroaggregative E coli (EAEC) NOT DETECTED NOT DETECTED Final   Enteropathogenic E coli (EPEC) NOT DETECTED NOT DETECTED Final   Enterotoxigenic E coli (ETEC) NOT DETECTED NOT DETECTED Final   Shiga like toxin producing E coli (STEC) NOT DETECTED NOT DETECTED Final   Shigella/Enteroinvasive E coli (EIEC) NOT DETECTED NOT DETECTED Final   Cryptosporidium NOT DETECTED NOT DETECTED Final   Cyclospora cayetanensis NOT DETECTED NOT DETECTED Final   Entamoeba histolytica NOT DETECTED NOT DETECTED Final   Giardia lamblia NOT DETECTED NOT DETECTED Final   Adenovirus F40/41 NOT DETECTED NOT DETECTED Final   Astrovirus NOT DETECTED NOT DETECTED Final   Norovirus GI/GII NOT DETECTED NOT DETECTED Final   Rotavirus A NOT DETECTED NOT DETECTED Final   Sapovirus (I, II, IV, and V) NOT DETECTED NOT DETECTED Final    Comment: Performed at [facility], [place] [place], [place] [address]  C Difficile Quick Screen w PCR reflex     Status: None   Collection Time: 10/10/20  5:03 AM   Specimen: STOOL  Result Value Ref Range Status   C Diff antigen NEGATIVE NEGATIVE Final   C Diff toxin NEGATIVE NEGATIVE Final   C Diff interpretation No C. difficile detected.  Final    Comment: Performed at [facility], [place] [address], [place], [place] [address]      Labs: CBC: Recent Labs  Lab 10/09/20 0436 10/10/20 0324 10/11/20 0451 10/12/20 0558  WBC 17.3* 16.5* 16.9* 14.3*  NEUTROABS 14.1*  --   --   --   HGB 13.7 13.3 13.2 13.2  HCT 42.3 40.4 40.3 40.6  MCV 86.5 84.7 83.8 85.1  PLT 400 379 387 240   Basic Metabolic Panel: Recent Labs  Lab 10/09/20 0436 10/09/20 1829 10/10/20 0324 10/11/20 0451 10/12/20 0558 10/13/20 0452  NA 138  --  137 136 137 136  K 2.7*  --  3.1* 3.2* 3.3* 3.0*  CL 103  --  105 104 104 103  CO2 24  --  22  22 18* 25  GLUCOSE 234*  --  159* 165* 134* 148*  BUN 7*  --  6* _0 CREATININE 0.68  --  0.75 0.64 0.66 0.65  CALCIUM 9.6  --  9.0 9.3 9.3 9.3  MG  --  1.9  --   --   --  1.8   Liver Function Tests: Recent Labs  Lab 10/09/20 0436  AST 18  ALT 14  ALKPHOS 102  BILITOT 0.5  PROT 8.2*  ALBUMIN 4.4   CBG: Recent Labs  Lab 10/11/20 2031 10/12/20 0727 10/12/20 1129 10/12/20 1642 10/13/20 0751  GLUCAP 112* 131* 89 105* 157*    Time spent: 35 minutes  Signed:  Berle Mull  [facility] 10/13/2020 5:33 PM

## 2020-10-17 ENCOUNTER — Telehealth: Payer: Self-pay

## 2020-10-17 NOTE — Telephone Encounter (Signed)
Transition Care Management Unsuccessful Follow-up Telephone Call  Date of discharge and from where:  10/13/2020, [facility]   Attempts:  2nd Attempt  Reason for unsuccessful TCM follow-up call:  Left voice message on # [phone].  Patient has appointment scheduled at [facility] tomorrow with Juluis Mire, NP

## 2020-10-18 ENCOUNTER — Ambulatory Visit (INDEPENDENT_AMBULATORY_CARE_PROVIDER_SITE_OTHER): Payer: Medicare HMO | Admitting: Primary Care

## 2020-10-18 ENCOUNTER — Other Ambulatory Visit: Payer: Self-pay

## 2020-10-18 ENCOUNTER — Encounter (INDEPENDENT_AMBULATORY_CARE_PROVIDER_SITE_OTHER): Payer: Self-pay | Admitting: Primary Care

## 2020-10-18 ENCOUNTER — Other Ambulatory Visit (HOSPITAL_COMMUNITY): Payer: Self-pay

## 2020-10-18 VITALS — BP 123/84 | HR 101 | Temp 97.5°F | Ht 61.0 in | Wt 111.2 lb

## 2020-10-18 DIAGNOSIS — E876 Hypokalemia: Secondary | ICD-10-CM | POA: Diagnosis not present

## 2020-10-18 DIAGNOSIS — Z09 Encounter for follow-up examination after completed treatment for conditions other than malignant neoplasm: Secondary | ICD-10-CM | POA: Diagnosis not present

## 2020-10-18 DIAGNOSIS — I10 Essential (primary) hypertension: Secondary | ICD-10-CM

## 2020-10-18 DIAGNOSIS — K529 Noninfective gastroenteritis and colitis, unspecified: Secondary | ICD-10-CM | POA: Diagnosis not present

## 2020-10-18 NOTE — Progress Notes (Signed)
Needs referral to GI 

## 2020-10-18 NOTE — Patient Instructions (Signed)
Potassium Content of Foods  The body needs potassium to control blood pressure and to keep the muscles and nervous system healthy. Here are some healthy foods below that are high in potassium. Also you can get the white label salt of "NO SALT" salt substitute, 1/4 teaspoon of this is equivalent to 20meq potassium.   FOODS AND DRINKS HIGH IN POTASSIUM FOODS MODERATE IN POTASSIUM   Fruits Avocado (cubed),  c / 50 g. Banana (sliced), 75 g. Cantaloupe (cubed), 80 g. Honeydew, 1 wedge / 85 g. Kiwi (sliced), 90 g. Nectarine, 1 small / 129 g. Orange, 1 medium / 131 g. Vegetables Artichoke,  of a medium / 64 g. Asparagus (boiled), 90 g.. Broccoli (boiled), 78 g. Brussels sprout (boiled), 78 g. Butternut squash (baked), 103 g. Chickpea (cooked), 82 g. Green peas (cooked), 80 g. Kidney beans (cooked), 5 tbsp / 55 g. Lima beans (cooked),  c / 43 g. Navy beans (cooked),  c / 61 g. Spinach (cooked),  c / 45 g. Sweet potato (baked),  c / 50 g. Tomato (chopped or sliced), 90 g. Vegetable juice. White mushrooms (cooked), 78 g. Yam (cooked or baked),  c / 34 g. Zucchini squash (boiled), 90 g. Other Foods and Drinks Almonds (whole),  c / 36 g. Fish, 3 oz / 85 g. Nonfat fruit variety yogurt, 123 g. Pistachio nuts, 1 oz / 28 g. Pumpkin seeds, 1 oz / 28 g. Red meat (broiled, cooked, grilled), 3 oz / 85 g. Scallops (steamed), 3 oz / 85 g. Spaghetti sauce,  c / 66 g. Sunflower seeds (dry roasted), 1 oz / 28 g. Veggie burger, 1 patty / 70 g. Fruits Grapefruit,  of the fruit / 123 g Plums (sliced), 83 g. Tangerine, 1 large / 120 g. Vegetables Carrots (boiled), 78 g. Carrots (sliced), 61 g. Rhubarb (cooked with sugar), 120 g. Rutabaga (cooked), 120 g. Yellow snap beans (cooked), 63 g. Other Foods and Drinks  Chicken breast (roasted and chopped),  c / 70 g. Pita bread, 1 large / 64 g. Shrimp (steamed), 4 oz / 113 g. Swiss cheese (diced), 70 g.     

## 2020-10-18 NOTE — Progress Notes (Signed)
Hospital Discharge     HPI  [name]  65 y.o.female presents for follow up from the hospital.  Patient presented to the emergency room with nausea, vomiting, diarrhea and abdominal pain the symptoms have been present for 1 week when she could not take no more she went to the emergency room.  She was also hypothermic bd pain.  She started throwing up yesterday.  +diarrhea.  Emesis x 7, diarrhea numerous. Admit date to the hospital was 10/09/20, patient was discharged from the hospital on 10/13/20, patient was admitted for: Enteritis of infectious origin.  Today she is fatigue and weak trying to eat and drink and keep a log for follow-up appointment with gastroenterology.  Past Medical History:  Diagnosis Date  . Anemia    hx  . Aneurysm ([place])    small, on left side of brain   . Arthritis    "all over" (06/19/2017)  . Diabetes mellitus without complication (HCC)    diet control, pt denies  . Hyperlipidemia   . Hypertension   . TIA (transient ischemic attack)    pt unaware of this hx on 06/19/2017     No Known Allergies    Current Outpatient Medications on File Prior to Visit  Medication Sig Dispense Refill  . amLODipine (NORVASC) 10 MG tablet Take 1 tablet (10 mg total) by mouth daily. 90 tablet 3  . ibuprofen (ADVIL) 600 MG tablet Take 1 tablet (600 mg total) by mouth every 8 (eight) hours as needed. 60 tablet 1  . losartan-hydrochlorothiazide (HYZAAR) 100-25 MG tablet Take 1 tablet by mouth daily. 90 tablet 3  . pantoprazole (PROTONIX) 40 MG tablet Take 1 tablet (40 mg total) by mouth 2 (two) times daily before a meal for 14 days. 28 tablet 0  . tiZANidine (ZANAFLEX) 2 MG tablet Take 1 tablet (2 mg total) by mouth every 6 (six) hours as needed for muscle spasms. 60 tablet 1  . vitamin B-12 (CYANOCOBALAMIN) 1000 MCG tablet Take 1 tablet (1,000 mcg total) by mouth daily. 30 tablet 0   No current facility-administered medications on file prior to visit.    ROS:  Review  of Systems  Constitutional: Positive for malaise/fatigue.  Respiratory: Positive for shortness of breath.   Gastrointestinal: Positive for abdominal pain, heartburn and nausea.  Neurological: Positive for weakness.  Psychiatric/Behavioral: The patient has insomnia.   All other systems reviewed and are negative.   Physical Exam: Filed Weights   10/18/20 1523  Weight: 111 lb 3.2 oz (50.4 kg)   BP 123/84 (BP Location: Right Arm, Patient Position: Sitting, Cuff Size: Normal)   Pulse (!) 101   Temp (!) 97.5 F (36.4 C) (Temporal)   Ht 5\' 1"  (1.549 m)   Wt 111 lb 3.2 oz (50.4 kg)   SpO2 100%   BMI 21.01 kg/m  General Appearance: cachetic, in apparent distress. Eyes: PERRLA, EOMs, conjunctiva no swelling or erythema Sinuses: No Frontal/maxillary tenderness ENT/Mouth: Ext aud canals clear, TMs without erythema, bulging. No erythema, swelling. Hearing normal.  Neck: Supple, thyroid normal.  Respiratory: Respiratory effort normal, BS equal bilaterally  Cardio: RRR with no MRGs.  Abdomen: Soft, + BS.hyperactive    tender,  Lymphatics: Non tender without lymphadenopathy.  Musculoskeletal: Full ROM, 5/5 strength, normal gait.  Skin: Warm, dry without rashes, lesions, ecchymosis.  Neuro: Cranial nerves intact. Normal muscle tone, no cerebellar symptoms. Sensation intact.  Psych: Awake and oriented X 3, normal affect, Insight and Judgment appropriate.    Josslin  was seen today for hospitalization follow-up.  Diagnoses and all orders for this visit:  Enteritis -     Ambulatory referral to Gastroenterology  Hospital discharge follow-up Discharge summary indicated follow-up with PCP in 1 week completed . Reviewing labs will ck cmp for hypokalemia  Essential hypertension Blood pressure is well controlled.  She brought her readings in since hospital discharge systolic range from 127 on this high once the rest of the ranges systolic or 1 51-700 and diastolic has ranged from 17-49.  She  does recall sleeping through a day no energy no medication no liquids no foods.  No change in medication amlodipine 10 mg daily and losartan HCTZ 100/25 daily.  Continue to monitor sodium intake exercise if tolerated.  Hypokalemia Secondary to nausea vomiting and diarrhea it has mostly resolved but still has nausea at times Will check CMP today  [name], NP 3:47 PM

## 2020-10-19 ENCOUNTER — Emergency Department (HOSPITAL_COMMUNITY)
Admission: EM | Admit: 2020-10-19 | Discharge: 2020-10-19 | Disposition: A | Discharge status: Home / Self Care | Payer: Medicare HMO | Attending: Emergency Medicine | Admitting: Emergency Medicine

## 2020-10-19 ENCOUNTER — Ambulatory Visit (INDEPENDENT_AMBULATORY_CARE_PROVIDER_SITE_OTHER): Payer: Self-pay | Admitting: *Deleted

## 2020-10-19 ENCOUNTER — Encounter (HOSPITAL_COMMUNITY): Payer: Self-pay | Admitting: Emergency Medicine

## 2020-10-19 ENCOUNTER — Emergency Department (HOSPITAL_COMMUNITY): Payer: Medicare HMO

## 2020-10-19 DIAGNOSIS — R531 Weakness: Secondary | ICD-10-CM | POA: Diagnosis not present

## 2020-10-19 DIAGNOSIS — E119 Type 2 diabetes mellitus without complications: Secondary | ICD-10-CM | POA: Insufficient documentation

## 2020-10-19 DIAGNOSIS — R000 Tachycardia, unspecified: Secondary | ICD-10-CM | POA: Diagnosis not present

## 2020-10-19 DIAGNOSIS — R0789 Other chest pain: Secondary | ICD-10-CM | POA: Insufficient documentation

## 2020-10-19 DIAGNOSIS — R079 Chest pain, unspecified: Secondary | ICD-10-CM

## 2020-10-19 DIAGNOSIS — I10 Essential (primary) hypertension: Secondary | ICD-10-CM | POA: Insufficient documentation

## 2020-10-19 DIAGNOSIS — Z79899 Other long term (current) drug therapy: Secondary | ICD-10-CM | POA: Insufficient documentation

## 2020-10-19 LAB — BASIC METABOLIC PANEL
Anion gap: 11 (ref 5–15)
BUN: 19 mg/dL (ref 8–23)
CO2: 27 mmol/L (ref 22–32)
Calcium: 9.8 mg/dL (ref 8.9–10.3)
Chloride: 98 mmol/L (ref 98–111)
Creatinine, Ser: 0.73 mg/dL (ref 0.44–1.00)
GFR, Estimated: >60 mL/min (ref >60)
Glucose, Bld: 157 mg/dL — ABNORMAL HIGH (ref 70–99)
Potassium: 3.4 mmol/L — ABNORMAL LOW (ref 3.5–5.1)
Sodium: 136 mmol/L (ref 135–145)

## 2020-10-19 LAB — D-DIMER, QUANTITATIVE: D-Dimer, Quant: 0.38 ug/mL-FEU (ref 0.00–0.50)

## 2020-10-19 LAB — CMP14+EGFR
ALT: 28 IU/L (ref 0–32)
AST: 18 IU/L (ref 0–40)
Albumin/Globulin Ratio: 1.6 (ref 1.2–2.2)
Albumin: 4.7 g/dL (ref 3.8–4.8)
Alkaline Phosphatase: 118 IU/L (ref 44–121)
BUN/Creatinine Ratio: 22 (ref 12–28)
BUN: 18 mg/dL (ref 8–27)
Bilirubin Total: <0.2 mg/dL (ref 0.0–1.2)
CO2: 23 mmol/L (ref 20–29)
Calcium: 10.6 mg/dL — ABNORMAL HIGH (ref 8.7–10.3)
Chloride: 97 mmol/L (ref 96–106)
Creatinine, Ser: 0.83 mg/dL (ref 0.57–1.00)
Globulin, Total: 3 g/dL (ref 1.5–4.5)
Glucose: 127 mg/dL — ABNORMAL HIGH (ref 65–99)
Potassium: 4.8 mmol/L (ref 3.5–5.2)
Sodium: 141 mmol/L (ref 134–144)
Total Protein: 7.7 g/dL (ref 6.0–8.5)
eGFR: 78 mL/min/{1.73_m2} (ref >59)

## 2020-10-19 LAB — CBC
HCT: 39.7 % (ref 36.0–46.0)
Hemoglobin: 12.8 g/dL (ref 12.0–15.0)
MCH: 27.6 pg (ref 26.0–34.0)
MCHC: 32.2 g/dL (ref 30.0–36.0)
MCV: 85.6 fL (ref 80.0–100.0)
Platelets: 452 10*3/uL — ABNORMAL HIGH (ref 150–400)
RBC: 4.64 MIL/uL (ref 3.87–5.11)
RDW: 13.3 % (ref 11.5–15.5)
WBC: 13.4 10*3/uL — ABNORMAL HIGH (ref 4.0–10.5)
nRBC: 0 % (ref 0.0–0.2)

## 2020-10-19 LAB — TROPONIN I (HIGH SENSITIVITY)
Troponin I (High Sensitivity): 7 ng/L (ref <18)
Troponin I (High Sensitivity): 8 ng/L (ref <18)

## 2020-10-19 MED ORDER — ASPIRIN 81 MG PO CHEW
324.0000 mg | CHEWABLE_TABLET | Freq: Once | ORAL | Status: AC
  Administered 2020-10-19: 324 mg via ORAL
  Filled 2020-10-19: qty 4

## 2020-10-19 NOTE — Discharge Instructions (Signed)
Your evaluation in the ED was reassuring.  We recommend follow-up with your primary care doctor. Return for new or concerning symptoms.

## 2020-10-19 NOTE — Telephone Encounter (Signed)
Pt called because "her heart is not feeling right"; she wonders if it was due to the "hard throwing up": she says it is not pain but a "discomfort" and soreness and constant; she rates it as 8 out of 10; the pain is in the center and breast area of her chest; the pt says she does not feel like answering any more questions; recommendations made per nurse triage; she verbalized and says she will go to the ED; the pt sees Juluis Mire at [facility]; will route to office for notification.  Reason for Disposition . SEVERE chest pain  Answer Assessment - Initial Assessment Questions 1. LOCATION: "Where does it hurt?"       Center of chest near left breast area 2. RADIATION: "Does the pain go anywhere else?" (e.g., into neck, jaw, arms, back)    no 3. ONSET: "When did the chest pain begin?" (Minutes, hours or days)      10/18/20 around 2300 4. PATTERN "Does the pain come and go, or has it been constant since it started?"  "Does it get worse with exertion?"      constant 5. DURATION: "How long does it last" (e.g., seconds, minutes, hours)   hours 6. SEVERITY: "How bad is the pain?"  (e.g., Scale 1-10; mild, moderate, or severe)    - MILD (1-3): doesn't interfere with normal activities     - MODERATE (4-7): interferes with normal activities or awakens from sleep    - SEVERE (8-10): excruciating pain, unable to do any normal activities       severe 7. CARDIAC RISK FACTORS: "Do you have any history of heart problems or risk factors for heart disease?" (e.g., angina, prior heart attack; diabetes, high blood pressure, high cholesterol, smoker, or strong family history of heart disease)    HTN 8. PULMONARY RISK FACTORS: "Do you have any history of lung disease?"  (e.g., blood clots in lung, asthma, emphysema, birth control pills)     Unable to assess 9. CAUSE: "What do you think is causing the chest pain?"     Not sure 10. OTHER SYMPTOMS: "Do you have any other symptoms?" (e.g., dizziness,  nausea, vomiting, sweating, fever, difficulty breathing, cough)    Unable to assess 11. PREGNANCY: "Is there any chance you are pregnant?" "When was your last menstrual period?"  Protocols used: CHEST PAIN-A-AH

## 2020-10-19 NOTE — ED Triage Notes (Signed)
Emergency Medicine Provider Triage Evaluation Note  MARIELY MAHR , a 65 y.o. female  was evaluated in triage.  Pt complains of left side chest discomfort today with SHOB earlier today, denies nausea. No cardiac hx, hospitalized last week for vomiting.  Review of Systems  Positive: CP, SHOB Negative: Nausea, diaphoresis  Physical Exam  BP (!) 144/85 (BP Location: Right Arm)   Pulse (!) 111   Temp 98.3 F (36.8 C) (Oral)   Resp 18   SpO2 100%  Gen:   Awake, no distress   HEENT:  Atraumatic  Resp:  Normal effort  Cardiac:  Normal rate  Abd:   Nondistended, nontender  MSK:   Moves extremities without difficulty  Neuro:  Speech clear   Medical Decision Making  Medically screening exam initiated at 2:50 PM.  Appropriate orders placed.  [name] was informed that the remainder of the evaluation will be completed by another provider, this initial triage assessment does not replace that evaluation, and the importance of remaining in the ED until their evaluation is complete.  Clinical Impression     Tacy Learn, PA-C 10/19/20 1452

## 2020-10-19 NOTE — ED Triage Notes (Signed)
Pt here from home with c/o chest pain center non radiatinging , no sob

## 2020-10-19 NOTE — ED Provider Notes (Signed)
[facility] EMERGENCY DEPARTMENT Provider Note   CSN: [id number] Arrival date & time: 10/19/20  1357     History No chief complaint on file.   [name] is a 65 y.o. female.  65 year old female with a history of diabetes, hyperlipidemia, hypertension, cerebral aneurysm presents to the emergency department for evaluation of central chest pain.  She describes a "nagging" discomfort which began yesterday evening and has remained constant.  It is spontaneously improving.  She reports minimal discomfort at present.  Denies any known modifying factors of her symptoms.  Specifically the pain is nonexertional.  Reported some mild associated SOB when in traige.  No medications taken prior to arrival.  Denies associated fever, bloating, burping/belching, diaphoresis, nausea, syncope, hemoptysis, leg swelling, vomiting.  No hx of ACS, tobacco use.  The history is provided by the patient. No language interpreter was used.       Past Medical History:  Diagnosis Date  . Anemia    hx  . Aneurysm ([place])    small, on left side of brain   . Arthritis    "all over" (06/19/2017)  . Diabetes mellitus without complication (HCC)    diet control, pt denies  . Hyperlipidemia   . Hypertension   . TIA (transient ischemic attack)    pt unaware of this hx on 06/19/2017    Patient Active Problem List   Diagnosis Date Noted  . Sepsis due to undetermined organism ([place]) 10/10/2020  . Enteritis of infectious origin 10/09/2020  . Diabetes mellitus type 2 in nonobese ([place]) 10/09/2020  . Bilateral impacted cerumen 04/02/2019  . Conductive hearing loss, bilateral 04/02/2019  . Nausea and vomiting 05/17/2018  . Enteritis 06/19/2017  . Abdominal pain 06/18/2017  . Hyperlipidemia 06/18/2017  . Elevated glucose 06/18/2017  . Need for hepatitis C screening test 05/23/2017  . Breast pain, left 01/05/2016  . Left ankle sprain 01/20/2015  . Gastroenteritis 11/08/2014  . Malaise  10/14/2014  . Hypokalemia 10/14/2014  . Essential hypertension   . Bilateral wrist pain 10/06/2014  . Hot flashes, menopausal 10/06/2014  . Cerebral aneurysm without rupture 08/20/2014  . TIA (transient ischemic attack) 08/19/2014  . Accelerated hypertension 02/24/2013  . Depression 02/24/2013    Past Surgical History:  Procedure Laterality Date  . ANTERIOR CERVICAL DECOMP/DISCECTOMY FUSION    . BACK SURGERY    . CESAREAN SECTION  1986  . FOOT SURGERY Left 11/2019  . IR GENERIC HISTORICAL  09/21/2014   IR ANGIO INTRA EXTRACRAN SEL INTERNAL CAROTID UNI L MOD SED 09/21/2014 [name], MD [facility]  . IR GENERIC HISTORICAL  09/21/2014   IR ANGIO INTRA EXTRACRAN SEL COM CAROTID INNOMINATE UNI R MOD SED 09/21/2014 [name], MD [facility]  . IR GENERIC HISTORICAL  09/21/2014   IR ANGIO VERTEBRAL SEL VERTEBRAL UNI L MOD SED 09/21/2014 [name], MD [facility]  . IR GENERIC HISTORICAL  09/21/2014   IR 3D INDEPENDENT WKST 09/21/2014 [name], MD [facility]  . LAPAROSCOPIC CHOLECYSTECTOMY    . TUBAL LIGATION       OB History    Gravida  2   Para  1   Term  1   Preterm      AB  1   Living  1     SAB  1   IAB      Ectopic      Multiple      Live Births  1  Family History  Problem Relation Age of Onset  . Hypertension Mother   . Arthritis Mother   . Diabetes Mother   . Dementia Mother   . Heart disease Father   . Diabetes Sister   . Diabetes Brother   . Diabetes Sister   . Cancer Neg Hx   . Colon cancer Neg Hx   . Colon polyps Neg Hx   . Esophageal cancer Neg Hx   . Rectal cancer Neg Hx   . Stomach cancer Neg Hx     Social History   Tobacco Use  . Smoking status: Never Smoker  . Smokeless tobacco: Never Used  Vaping Use  . Vaping Use: Never used  Substance Use Topics  . Alcohol use: Yes    Alcohol/week: 3.0 standard drinks    Types: 3 Standard drinks or equivalent per week    Comment: socially    . Drug use: Not Currently    Home Medications Prior to Admission medications   Medication Sig Start Date End Date Taking? Authorizing Provider  amLODipine (NORVASC) 10 MG tablet Take 1 tablet (10 mg total) by mouth daily. 03/09/20   [name], NP  ibuprofen (ADVIL) 600 MG tablet Take 1 tablet (600 mg total) by mouth every 8 (eight) hours as needed. 05/02/20   [name], NP  losartan-hydrochlorothiazide (HYZAAR) 100-25 MG tablet Take 1 tablet by mouth daily. 03/09/20   [name], NP  pantoprazole (PROTONIX) 40 MG tablet Take 1 tablet (40 mg total) by mouth 2 (two) times daily before a meal for 14 days. 10/13/20 10/27/20  Lavina Hamman, MD  tiZANidine (ZANAFLEX) 2 MG tablet Take 1 tablet (2 mg total) by mouth every 6 (six) hours as needed for muscle spasms. 05/05/20   [name], NP  vitamin B-12 (CYANOCOBALAMIN) 1000 MCG tablet Take 1 tablet (1,000 mcg total) by mouth daily. 10/15/20   Lavina Hamman, MD    Allergies    Patient has no known allergies.  Review of Systems   Review of Systems  Ten systems reviewed and are negative for acute change, except as noted in the HPI.    Physical Exam Updated Vital Signs BP 138/86   Pulse 83   Temp 98.4 F (36.9 C)   Resp 18   SpO2 98%   Physical Exam Vitals and nursing note reviewed.  Constitutional:      General: She is not in acute distress.    Appearance: She is well-developed. She is not diaphoretic.     Comments: Nontoxic appearing and in NAD  HENT:     Head: Normocephalic and atraumatic.  Eyes:     General: No scleral icterus.    Conjunctiva/sclera: Conjunctivae normal.  Cardiovascular:     Rate and Rhythm: Regular rhythm. Tachycardia present.     Pulses: Normal pulses.     Comments: Mild tachycardia; transient Pulmonary:     Effort: Pulmonary effort is normal. No respiratory distress.     Breath sounds: No stridor. No wheezing, rhonchi or rales.     Comments: Lungs CTAB. Respirations  even and unlabored. Musculoskeletal:        General: Normal range of motion.     Cervical back: Normal range of motion.  Skin:    General: Skin is warm and dry.     Coloration: Skin is not pale.     Findings: No erythema or rash.  Neurological:     Mental Status: She is alert and oriented to  person, place, and time.     Coordination: Coordination normal.  Psychiatric:        Behavior: Behavior normal.     ED Results / Procedures / Treatments   Labs (all labs ordered are listed, but only abnormal results are displayed) Labs Reviewed  BASIC METABOLIC PANEL - Abnormal; Notable for the following components:      Result Value   Potassium 3.4 (*)    Glucose, Bld 157 (*)    All other components within normal limits  CBC - Abnormal; Notable for the following components:   WBC 13.4 (*)    Platelets 452 (*)    All other components within normal limits  D-DIMER, QUANTITATIVE  TROPONIN I (HIGH SENSITIVITY)  TROPONIN I (HIGH SENSITIVITY)    EKG None  Radiology DG Chest 1 View  Result Date: 10/19/2020 CLINICAL DATA:  Chest pain and weakness EXAM: CHEST  1 VIEW COMPARISON:  October 09, 2020 FINDINGS: The heart size and mediastinal contours are within normal limits. Both lungs are clear. Surgical clips over the right upper quadrant. Anterior cervical fusion hardware. No acute osseous abnormality. IMPRESSION: No evidence of active cardiopulmonary disease. Electronically Signed   By: Dahlia Bailiff MD   On: 10/19/2020 15:33    Procedures Procedures   Medications Ordered in ED Medications  aspirin chewable tablet 324 mg (324 mg Oral Given 10/19/20 1852)    ED Course  I have reviewed the triage vital signs and the nursing notes.  Pertinent labs & imaging results that were available during my care of the patient were reviewed by me and considered in my medical decision making (see chart for details).    MDM Rules/Calculators/A&P                          Patient presents to the  emergency department for evaluation of nonspecific, "nagging" chest pain onset last night which has remained constant. Symptoms now spontaneously improved.  Low suspicion for emergent cardiac etiology given reassuring workup today.  EKG is nonischemic and troponin negative x2.  Chest x-ray without evidence of mediastinal widening to suggest dissection.  No pneumothorax, pneumonia, pleural effusion.  Pulmonary embolus further considered; however, patient without tachypnea, dyspnea, hypoxia.  D dimer negative.  Unclear etiology of symptoms.  May be related to some mild gastritis or esophagitis related to prolonged nausea and vomiting for which patient was just hospitalized.  She is hemodynamically stable, well-appearing.  Have advised that she continue follow-up with her primary care doctor.  Do not feel further emergent work-up is presently indicated.  Discharged in stable condition with no unaddressed concerns.   Final Clinical Impression(s) / ED Diagnoses Final diagnoses:  Nonspecific chest pain    Rx / DC Orders ED Discharge Orders    None       Antonietta Breach, Hershal Coria 10/19/20 2203    Noemi Chapel, MD 10/29/20 1447

## 2020-10-24 ENCOUNTER — Ambulatory Visit (INDEPENDENT_AMBULATORY_CARE_PROVIDER_SITE_OTHER): Payer: Medicare HMO | Admitting: Nurse Practitioner

## 2020-10-24 ENCOUNTER — Other Ambulatory Visit: Payer: Self-pay

## 2020-10-24 ENCOUNTER — Encounter: Payer: Self-pay | Admitting: Nurse Practitioner

## 2020-10-24 VITALS — BP 138/70 | HR 80 | Ht 61.0 in | Wt 109.0 lb

## 2020-10-24 DIAGNOSIS — R197 Diarrhea, unspecified: Secondary | ICD-10-CM

## 2020-10-24 DIAGNOSIS — R112 Nausea with vomiting, unspecified: Secondary | ICD-10-CM

## 2020-10-24 NOTE — Patient Instructions (Signed)
If you are age 65 or older, your body mass index should be between 23-30. Your Body mass index is 20.6 kg/m. If this is out of the aforementioned range listed, please consider follow up with your Primary Care Provider.  If you are age 46 or younger, your body mass index should be between 19-25. Your Body mass index is 20.6 kg/m. If this is out of the aformentioned range listed, please consider follow up with your Primary Care Provider.   You have been scheduled for an endoscopy. Please follow written instructions given to you at your visit today. If you use inhalers (even only as needed), please bring them with you on the day of your procedure.  Due to recent changes in healthcare laws, you may see the results of your imaging and laboratory studies on MyChart before your provider has had a chance to review them.  We understand that in some cases there may be results that are confusing or concerning to you. Not all laboratory results come back in the same time frame and the provider may be waiting for multiple results in order to interpret others.  Please give [name] 48 hours in order for your provider to thoroughly review all the results before contacting the office for clarification of your results.   Thank you for choosing me and [facility].  Tye Savoy, NP

## 2020-10-24 NOTE — Progress Notes (Signed)
ASSESSMENT AND PLAN    65 yo female with chronic, intermittent nausea / vomiting. Mutlple negative CT scans dating back to 2018.  --Trial of Zofran 4 mg BID prn nausea --Complete the total of 14 days of BID Pantoprazole prescribed at hospital discharge. --For further evaluation patient will be scheduled for EGD. The risks and benefits of EGD with possible biopsies was discussed with the patient and they agree to proceed.     HISTORY OF PRESENT ILLNESS    Chief Complaint : nausea and vomiting  [name] is a 65 y.o. female known to Dr. Fuller Plan with a past medical history significant for TIA, HTN, hyperlipedemia, and diabetes, cholecystectomy. See PMH below for any additional medical problems.    Patient was recently admitted to the hospital for 6 day history of  N/V/D. Marland KitchenHer WBC was elevated at 17, she met sepsis criteria. Liver chemistries were normal. Renal function normal. CT scan was negative.  SARS negative. Blood cultures negatvei  Stools C-diff negative, GI pathogen panel negative. She says her symptoms persisted several days in the hospital. She was treated with IV fluids, anti-emetics. Despite extensive workup there was no evidence for acute infection. Sepsis was ruled out. Upon discharge on 4/21 she was feeling better and tolerating solids. Of note patient was admitted in Dec 2018 similar symptoms. In addition to that, review of records in Epic showed that she had CT scans in June 2019, November 2019 and March 2021 or nausea / vomiting and all were unrevealing.    Today patient developed similar symptoms again. . She had nausea / vomiting, perfuse sweating and diarrhea.  PREVIOUS ENDOSCOPIC EVALUATIONS / PERTINENT STUDIES:   May 2021 screening olonscoy  --complete exam, prep after extensive lavage  Three 10 to 15 mm polyps in the sigmoid colon and in the transverse colon, removed with a hot snare. Resected and retrieved. - One 6 mm polyp in the transverse colon, removed  with a cold snare. Complete resection. Polyp tissue not retrieved. - One 7 mm polyp in the rectum, removed with a cold snare. Resected and retrieved. Clips (MR conditional) were placed. - Moderate diverticulosis in the left colon. - Mild diverticulosis in the right colon. - The examination was otherwise normal on direct and retroflexion views.     Past Medical History:  Diagnosis Date  . Anemia    hx  . 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 Arthritis    "all over" (06/19/2017)  . Diabetes mellitus without complication (HCC)    diet control, pt denies  . Hyperlipidemia   . Hypertension   . TIA (transient ischemic attack)    pt unaware of this hx on 06/19/2017    Current Medications, Allergies, Past Surgical History, Family History and Social History were reviewed in [employer] record.   Current Outpatient Medications  Medication Sig Dispense Refill  . amLODipine (NORVASC) 10 MG tablet Take 1 tablet (10 mg total) by mouth daily. 90 tablet 3  . ibuprofen (ADVIL) 600 MG tablet Take 1 tablet (600 mg total) by mouth every 8 (eight) hours as needed. 60 tablet 1  . losartan-hydrochlorothiazide (HYZAAR) 100-25 MG tablet Take 1 tablet by mouth daily. 90 tablet 3  . pantoprazole (PROTONIX) 40 MG tablet Take 1 tablet (40 mg total) by mouth 2 (two) times daily before a meal for 14 days. 28 tablet 0  . tiZANidine (ZANAFLEX) 2 MG tablet Take 1 tablet (2  mg total) by mouth every 6 (six) hours as needed for muscle spasms. 60 tablet 1  . vitamin B-12 (CYANOCOBALAMIN) 1000 MCG tablet Take 1 tablet (1,000 mcg total) by mouth daily. 30 tablet 0   No current facility-administered medications for this visit.    Review of Systems: No chest pain. No shortness of breath. No urinary complaints.   PHYSICAL EXAM :    Wt Readings from Last 3 Encounters:  10/24/20 109 lb (49.4 kg)  10/18/20 111 lb 3.2 oz (50.4 kg)  10/09/20 112 lb 3.4 oz (50.9 kg)    BP 138/70    Pulse 80   Ht '5\' 1"'  (1.549 m)   Wt 109 lb (49.4 kg)   BMI 20.60 kg/m  Constitutional:  Pleasant female in no acute distress. Psychiatric: Normal mood and affect. Behavior is normal. EENT: Pupils normal.  Conjunctivae are normal. No scleral icterus. Neck supple.  Cardiovascular: Normal rate, regular rhythm. No edema Pulmonary/chest: Effort normal and breath sounds normal. No wheezing, rales or rhonchi. Abdominal: Soft, nondistended, nontender. Bowel sounds active throughout. There are no masses palpable. No hepatomegaly. Neurological: Alert and oriented to person place and time. Skin: Skin is warm and dry. No rashes noted.  Tye Savoy, NP  10/24/2020, 2:09 PM

## 2020-10-27 ENCOUNTER — Other Ambulatory Visit: Payer: Self-pay

## 2020-10-27 ENCOUNTER — Other Ambulatory Visit (HOSPITAL_COMMUNITY): Payer: Self-pay

## 2020-10-27 ENCOUNTER — Ambulatory Visit (AMBULATORY_SURGERY_CENTER): Payer: Medicare HMO | Admitting: Gastroenterology

## 2020-10-27 ENCOUNTER — Encounter: Payer: Self-pay | Admitting: Gastroenterology

## 2020-10-27 VITALS — BP 124/86 | HR 97 | Temp 97.8°F | Resp 12 | Ht 61.0 in | Wt 109.0 lb

## 2020-10-27 DIAGNOSIS — R112 Nausea with vomiting, unspecified: Secondary | ICD-10-CM | POA: Diagnosis not present

## 2020-10-27 DIAGNOSIS — K219 Gastro-esophageal reflux disease without esophagitis: Secondary | ICD-10-CM | POA: Diagnosis not present

## 2020-10-27 DIAGNOSIS — R197 Diarrhea, unspecified: Secondary | ICD-10-CM | POA: Diagnosis not present

## 2020-10-27 DIAGNOSIS — K2970 Gastritis, unspecified, without bleeding: Secondary | ICD-10-CM | POA: Diagnosis not present

## 2020-10-27 DIAGNOSIS — K2950 Unspecified chronic gastritis without bleeding: Secondary | ICD-10-CM | POA: Diagnosis not present

## 2020-10-27 DIAGNOSIS — R634 Abnormal weight loss: Secondary | ICD-10-CM

## 2020-10-27 DIAGNOSIS — I10 Essential (primary) hypertension: Secondary | ICD-10-CM | POA: Diagnosis not present

## 2020-10-27 DIAGNOSIS — K449 Diaphragmatic hernia without obstruction or gangrene: Secondary | ICD-10-CM | POA: Diagnosis not present

## 2020-10-27 MED ORDER — PANTOPRAZOLE SODIUM 40 MG PO TBEC
40.0000 mg | DELAYED_RELEASE_TABLET | Freq: Every day | ORAL | 3 refills | Status: DC
Start: 2020-10-27 — End: 2021-11-02
  Filled 2020-10-27 (×2): qty 90, 90d supply, fill #0
  Filled 2021-03-13: qty 90, 90d supply, fill #1
  Filled 2021-07-07: qty 90, 90d supply, fill #2
  Filled 2021-10-16: qty 90, 90d supply, fill #3

## 2020-10-27 MED ORDER — SODIUM CHLORIDE 0.9 % IV SOLN: 500.0000 mL | Freq: Once | INTRAVENOUS | Status: DC

## 2020-10-27 NOTE — Patient Instructions (Signed)
Handout on hiatal hernia given.  Pantoprazole 40 mg daily.   YOU HAD AN ENDOSCOPIC PROCEDURE TODAY AT [facility]:   Refer to the procedure report that was given to you for any specific questions about what was found during the examination.  If the procedure report does not answer your questions, please call your gastroenterologist to clarify.  If you requested that your care partner not be given the details of your procedure findings, then the procedure report has been included in a sealed envelope for you to review at your convenience later.  YOU SHOULD EXPECT: Some feelings of bloating in the abdomen. Passage of more gas than usual.  Walking can help get rid of the air that was put into your GI tract during the procedure and reduce the bloating. If you had a lower endoscopy (such as a colonoscopy or flexible sigmoidoscopy) you may notice spotting of blood in your stool or on the toilet paper. If you underwent a bowel prep for your procedure, you may not have a normal bowel movement for a few days.  Please Note:  You might notice some irritation and congestion in your nose or some drainage.  This is from the oxygen used during your procedure.  There is no need for concern and it should clear up in a day or so.  SYMPTOMS TO REPORT IMMEDIATELY:    Following upper endoscopy (EGD)  Vomiting of blood or coffee ground material  New chest pain or pain under the shoulder blades  Painful or persistently difficult swallowing  New shortness of breath  Fever of 100F or higher  Black, tarry-looking stools  For urgent or emergent issues, a gastroenterologist can be reached at any hour by calling [phone]. Do not use MyChart messaging for urgent concerns.    DIET:  We do recommend a small meal at first, but then you may proceed to your regular diet.  Drink plenty of fluids but you should avoid alcoholic beverages for 24 hours.  ACTIVITY:  You should plan to take it easy for the  rest of today and you should NOT DRIVE or use heavy machinery until tomorrow (because of the sedation medicines used during the test).    FOLLOW UP: Our staff will call the number listed on your records 48-72 hours following your procedure to check on you and address any questions or concerns that you may have regarding the information given to you following your procedure. If we do not reach you, we will leave a message.  We will attempt to reach you two times.  During this call, we will ask if you have developed any symptoms of COVID 19. If you develop any symptoms (ie: fever, flu-like symptoms, shortness of breath, cough etc.) before then, please call [phone].  If you test positive for Covid 19 in the 2 weeks post procedure, please call and report this information to [name].    If any biopsies were taken you will be contacted by phone or by letter within the next 1-3 weeks.  Please call us at [phone] if you have not heard about the biopsies in 3 weeks.    SIGNATURES/CONFIDENTIALITY: You and/or your care partner have signed paperwork which will be entered into your electronic medical record.  These signatures attest to the fact that that the information above on your After Visit Summary has been reviewed and is understood.  Full responsibility of the confidentiality of this discharge information lies with you and/or your care-partner.

## 2020-10-27 NOTE — Progress Notes (Signed)
Report to PACU, RN, vss, BBS= Clear.  

## 2020-10-27 NOTE — Progress Notes (Signed)
Called to room to assist during endoscopic procedure.  Patient ID and intended procedure confirmed with present staff. Received instructions for my participation in the procedure from the performing physician.  

## 2020-10-27 NOTE — Op Note (Signed)
[place] Patient Name: [name] Procedure Date: 10/27/2020 9:58 AM MRN: [id number] Endoscopist: Ladene Artist , MD Age: 65 Referring MD:  Date of Birth: [birth year] Gender: Female Account #: [id number] Procedure:                Upper GI endoscopy Indications:              Nausea with vomiting, Weight loss Medicines:                Monitored Anesthesia Care Procedure:                Pre-Anesthesia Assessment:                           - Prior to the procedure, a History and Physical                            was performed, and patient medications and                            allergies were reviewed. The patient's tolerance of                            previous anesthesia was also reviewed. The risks                            and benefits of the procedure and the sedation                            options and risks were discussed with the patient.                            All questions were answered, and informed consent                            was obtained. Prior Anticoagulants: The patient has                            taken no previous anticoagulant or antiplatelet                            agents. ASA Grade Assessment: II - A patient with                            mild systemic disease. After reviewing the risks                            and benefits, the patient was deemed in                            satisfactory condition to undergo the procedure.                           After obtaining informed consent, the endoscope was  passed under direct vision. Throughout the                            procedure, the patient's blood pressure, pulse, and                            oxygen saturations were monitored continuously. The                            Endoscope was introduced through the mouth, and                            advanced to the second part of duodenum. The upper                            GI endoscopy was  accomplished without difficulty.                            The patient tolerated the procedure well. Scope In: Scope Out: Findings:                 The examined esophagus was normal.                           Patchy mildly erythematous mucosa without bleeding                            was found in the entire examined stomach. Biopsies                            were taken with a cold forceps for histology.                           A small hiatal hernia was present.                           The exam of the stomach was otherwise normal.                           The duodenal bulb and second portion of the                            duodenum were normal. Complications:            No immediate complications. Estimated Blood Loss:     Estimated blood loss was minimal. Impression:               - Normal esophagus.                           - Erythematous mucosa in the stomach. Biopsied.                           - Small hiatal hernia.                           -  Normal duodenal bulb and second portion of the                            duodenum. Recommendation:           - Patient has a contact number available for                            emergencies. The signs and symptoms of potential                            delayed complications were discussed with the                            patient. Return to normal activities tomorrow.                            Written discharge instructions were provided to the                            patient.                           - Resume previous diet.                           - Follow antireflux measures.                           - Continue present medications.                           - Pantoprazole 40 mg po qd, 1 year of refills.                           - Await pathology results.                           - GI office appt with me or Tye Savoy, NP in 1                            month. Ladene Artist, MD 10/27/2020 10:12:48 AM This  report has been signed electronically.

## 2020-10-28 ENCOUNTER — Telehealth: Payer: Self-pay | Admitting: Gastroenterology

## 2020-10-28 NOTE — Telephone Encounter (Signed)
Inbound call from patient. States she is feeling fine after procedure. Just have a little sore throat.

## 2020-10-28 NOTE — Telephone Encounter (Signed)
  Follow up Call-  Call back number 10/27/2020 11/05/2019  Post procedure Call Back phone  # [phone]  Permission to leave phone message Yes Yes  Some recent data might be hidden     Patient questions:  Do you have a fever, pain , or abdominal swelling? No. Pain Score  0 *  Have you tolerated food without any problems? Yes.    Have you been able to return to your normal activities? Yes.    Do you have any questions about your discharge instructions: Diet   No. Medications  No. Follow up visit  No.  Do you have questions or concerns about your Care? No.  Actions: * If pain score is 4 or above: No action needed, pain <4

## 2020-10-30 ENCOUNTER — Encounter: Payer: Self-pay | Admitting: Nurse Practitioner

## 2020-10-31 ENCOUNTER — Other Ambulatory Visit: Payer: Self-pay | Admitting: Primary Care

## 2020-10-31 ENCOUNTER — Telehealth: Payer: Self-pay

## 2020-10-31 ENCOUNTER — Telehealth: Payer: Self-pay | Admitting: *Deleted

## 2020-10-31 DIAGNOSIS — Z1231 Encounter for screening mammogram for malignant neoplasm of breast: Secondary | ICD-10-CM

## 2020-10-31 NOTE — Telephone Encounter (Signed)
  Follow up Call-  Call back number 10/27/2020 11/05/2019  Post procedure Call Back phone  # [phone]  Permission to leave phone message Yes Yes  Some recent data might be hidden     Patient questions:  Do you have a fever, pain , or abdominal swelling? No. Pain Score  0 *  Have you tolerated food without any problems? Yes.    Have you been able to return to your normal activities? Yes.    Do you have any questions about your discharge instructions: Diet   No. Medications  No. Follow up visit  No.  Do you have questions or concerns about your Care? No.  Actions: * If pain score is 4 or above: No action needed, pain <4.  1. Have you developed a fever since your procedure? No  2.   Have you had an respiratory symptoms (SOB or cough) since your procedure? No 3.   Have you tested positive for COVID 19 since your procedure No 4.   Have you had any family members/close contacts diagnosed with the COVID 19 since your procedure? No  If yes to any of these questions please route to Joylene John, RN and Joella Prince, RN

## 2020-10-31 NOTE — Telephone Encounter (Signed)
First post procedure follow up call, no answer 

## 2020-11-09 ENCOUNTER — Telehealth: Payer: Self-pay | Admitting: Gastroenterology

## 2020-11-09 NOTE — Telephone Encounter (Signed)
Inbound call from patient. Inquiring about lab results from 5/5. Best contact number [phone]

## 2020-11-10 ENCOUNTER — Encounter: Payer: Self-pay | Admitting: Gastroenterology

## 2020-11-10 NOTE — Telephone Encounter (Signed)
Patient notified of the results and recommendations 

## 2020-11-10 NOTE — Telephone Encounter (Signed)
See path letter. Mild chronic gastritis. No H pylori.

## 2020-11-10 NOTE — Telephone Encounter (Signed)
Patient calling for path results.  Please advise

## 2020-11-11 ENCOUNTER — Other Ambulatory Visit: Payer: Self-pay

## 2020-11-11 ENCOUNTER — Ambulatory Visit (INDEPENDENT_AMBULATORY_CARE_PROVIDER_SITE_OTHER): Payer: Medicare HMO | Admitting: Primary Care

## 2020-11-11 ENCOUNTER — Encounter (INDEPENDENT_AMBULATORY_CARE_PROVIDER_SITE_OTHER): Payer: Self-pay | Admitting: Primary Care

## 2020-11-11 VITALS — BP 135/83 | HR 96 | Temp 97.3°F | Ht 61.0 in | Wt 111.4 lb

## 2020-11-11 DIAGNOSIS — I10 Essential (primary) hypertension: Secondary | ICD-10-CM | POA: Diagnosis not present

## 2020-11-11 DIAGNOSIS — Z760 Encounter for issue of repeat prescription: Secondary | ICD-10-CM | POA: Diagnosis not present

## 2020-11-11 DIAGNOSIS — M62838 Other muscle spasm: Secondary | ICD-10-CM | POA: Diagnosis not present

## 2020-11-11 DIAGNOSIS — K219 Gastro-esophageal reflux disease without esophagitis: Secondary | ICD-10-CM | POA: Diagnosis not present

## 2020-11-11 MED ORDER — TIZANIDINE HCL 2 MG PO TABS
2.0000 mg | ORAL_TABLET | Freq: Four times a day (QID) | ORAL | 1 refills | Status: DC | PRN
  Filled 2020-11-11: qty 60, 15d supply, fill #0
  Filled 2021-05-15: qty 60, 15d supply, fill #1

## 2020-11-11 MED ORDER — LOSARTAN POTASSIUM-HCTZ 100-25 MG PO TABS
1.0000 | ORAL_TABLET | Freq: Every day | ORAL | 3 refills | Status: DC
  Filled 2020-11-11: qty 90, 90d supply, fill #0
  Filled 2021-03-13 – 2021-03-20 (×2): qty 90, 90d supply, fill #1

## 2020-11-11 MED ORDER — AMLODIPINE BESYLATE 10 MG PO TABS
10.0000 mg | ORAL_TABLET | Freq: Every day | ORAL | 1 refills | Status: DC
  Filled 2020-11-11: qty 90, 90d supply, fill #0
  Filled 2021-03-13 – 2021-03-20 (×2): qty 90, 90d supply, fill #1

## 2020-11-11 NOTE — Patient Instructions (Signed)
Gastroparesis is also called delayed gastric emptying it consist of weak muscular contractions (peristalsis) of the stomach resulting in food and liquid remaining in the stomach for prolonged periods of time.  Stomach contents does exist more slowly into the duodenum digestive tract.  Symptoms can include nausea vomiting abdominal pain feeling full soon after or beginning to eat, abdominal bloating and heartburn.

## 2020-11-11 NOTE — Progress Notes (Signed)
Established Patient Office Visit  Subjective:  Patient ID: [name], female    DOB: [birth year]  Age: 65 y.o. MRN: [id number]  CC:  Chief Complaint  Patient presents with  . Follow-up    GI and deconditioning     HPI [name] is a 65 year old been trying female who presents for follow up on blood pressure- Denies shortness of breath, headaches, chest pain or lower extremity edema, sudden onset, vision changes, unilateral weakness, dizziness, paresthesias.  She brings in her blood pressure log systolic blood pressure has ranged from 105-158- (there are few and in between greater than 130 and on those days she does admit having pizza or sausage biscuit.  Her diastolic has ranged from 19-50 isolated blood pressure diastolic 99.  She has also been evaluated by gastroenterology and is feeling a lot better and changed her diet to decrease acidity intake. She is also concern with intermittent muscle spasm left arm and feeling of numbness. Past Medical History:  Diagnosis Date  . Anemia    hx  . 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 Arthritis    "all over" (06/19/2017)  . Diabetes mellitus without complication (HCC)    diet control, pt denies  . Hyperlipidemia   . Hypertension   . TIA (transient ischemic attack)    pt unaware of this hx on 06/19/2017    Past Surgical History:  Procedure Laterality Date  . ANTERIOR CERVICAL DECOMP/DISCECTOMY FUSION    . BACK SURGERY    . CESAREAN SECTION  1986  . FOOT SURGERY Left 11/2019  . IR GENERIC HISTORICAL  09/21/2014   IR ANGIO INTRA EXTRACRAN SEL INTERNAL CAROTID UNI L MOD SED 09/21/2014 [name], MD [facility]  . IR GENERIC HISTORICAL  09/21/2014   IR ANGIO INTRA EXTRACRAN SEL COM CAROTID INNOMINATE UNI R MOD SED 09/21/2014 [name], MD [facility]  . IR GENERIC HISTORICAL  09/21/2014   IR ANGIO VERTEBRAL SEL VERTEBRAL UNI L MOD SED 09/21/2014 [name], MD [facility]  . IR  GENERIC HISTORICAL  09/21/2014   IR 3D INDEPENDENT WKST 09/21/2014 [name], MD [facility]  . LAPAROSCOPIC CHOLECYSTECTOMY    . TUBAL LIGATION      Family History  Problem Relation Age of Onset  . Hypertension Mother   . Arthritis Mother   . Diabetes Mother   . Dementia Mother   . Heart disease Father   . Diabetes Sister   . Diabetes Brother   . Diabetes Sister   . Cancer Neg Hx   . Colon cancer Neg Hx   . Colon polyps Neg Hx   . Esophageal cancer Neg Hx   . Rectal cancer Neg Hx   . Stomach cancer Neg Hx     Social History   Socioeconomic History  . Marital status: Widowed    Spouse name: Not on file  . Number of children: 1  . Years of education: Not on file  . Highest education level: Some college, no degree  Occupational History  . 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 Smoking status: Never Smoker  . Smokeless tobacco: Never Used  Vaping Use  . Vaping Use: Never used  Substance and Sexual Activity  . Alcohol use: Yes    Alcohol/week: 3.0 standard drinks    Types: 3 Standard drinks or equivalent per week    Comment:  socially   . Drug use: Not Currently  . Sexual activity: Yes  Other Topics Concern  . 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 pantoprazole (PROTONIX) 40 MG tablet Take 1 tablet (40 mg total) by mouth daily. 90 tablet 3  . vitamin B-12 (CYANOCOBALAMIN) 1000 MCG tablet Take 1 tablet (1,000 mcg total) by mouth daily. 30  tablet 0  . amLODipine (NORVASC) 10 MG tablet Take 1 tablet (10 mg total) by mouth daily. 90 tablet 3  . losartan-hydrochlorothiazide (HYZAAR) 100-25 MG tablet Take 1 tablet by mouth daily. 90 tablet 3  . ibuprofen (ADVIL) 600 MG tablet Take 1 tablet (600 mg total) by mouth every 8 (eight) hours as needed. (Patient not taking: Reported on 10/27/2020) 60 tablet 1  . tiZANidine (ZANAFLEX) 2 MG tablet Take 1 tablet (2 mg total) by mouth every 6 (six) hours as needed for muscle spasms. (Patient not taking: No sig reported) 60 tablet 1   No facility-administered medications prior to visit.    No Known Allergies  ROS Review of Systems  Gastrointestinal:       Heart burn  All other systems reviewed and are negative.     Objective:    Physical Exam Vitals reviewed.  Constitutional:      Comments: Thin frame  HENT:     Head: Normocephalic.     Right Ear: External ear normal.     Left Ear: External ear normal.     Nose: Nose normal.  Eyes:     Extraocular Movements: Extraocular movements intact.  Cardiovascular:     Rate and Rhythm: Normal rate and regular rhythm.  Pulmonary:     Effort: Pulmonary effort is normal.     Breath sounds: Normal breath sounds.  Abdominal:     General: Bowel sounds are normal.     Palpations: Abdomen is soft.  Musculoskeletal:        General: Normal range of motion.     Cervical back: Normal range of motion.  Skin:    General: Skin is warm and dry.  Neurological:     Mental Status: She is alert and oriented to person, place, and time.  Psychiatric:        Mood and Affect: Mood normal.        Behavior: Behavior normal.        Thought Content: Thought content normal.        Judgment: Judgment normal.     BP 135/83 (BP Location: Right Arm, Patient Position: Sitting, Cuff Size: Small)   Pulse 96   Temp (!) 97.3 F (36.3 C) (Temporal)   Ht _0  (1.549 m)   Wt 111 lb 6.4 oz (50.5 kg)   SpO2 100%   BMI 21.05 kg/m  Wt Readings from Last 3  Encounters:  11/11/20 111 lb 6.4 oz (50.5 kg)  10/27/20 109 lb (49.4 kg)  10/24/20 109 lb (49.4 kg)     Health Maintenance Due  Topic  Date Due  . FOOT EXAM  Never done  . OPHTHALMOLOGY EXAM  07/25/2018  . COVID-19 Vaccine (3 - Booster for Pfizer series) 07/07/2020    There are no preventive care reminders to display for this patient.  Lab Results  Component Value Date   TSH 1.900 01/29/2020   Lab Results  Component Value Date   WBC 13.4 (H) 10/19/2020   HGB 12.8 10/19/2020   HCT 39.7 10/19/2020   MCV 85.6 10/19/2020   PLT 452 (H) 10/19/2020   Lab Results  Component Value Date   NA 136 10/19/2020   K 3.4 (L) 10/19/2020   CO2 27 10/19/2020   GLUCOSE 157 (H) 10/19/2020   BUN 19 10/19/2020   CREATININE 0.73 10/19/2020   BILITOT <0.2 10/18/2020   ALKPHOS 118 10/18/2020   AST 18 10/18/2020   ALT 28 10/18/2020   PROT 7.7 10/18/2020   ALBUMIN 4.7 10/18/2020   CALCIUM 9.8 10/19/2020   ANIONGAP 11 10/19/2020   EGFR 78 10/18/2020   Lab Results  Component Value Date   CHOL 199 05/27/2020   Lab Results  Component Value Date   HDL 73 05/27/2020   Lab Results  Component Value Date   LDLCALC 114 (H) 05/27/2020   Lab Results  Component Value Date   TRIG 68 05/27/2020   Lab Results  Component Value Date   CHOLHDL 2.7 05/27/2020   Lab Results  Component Value Date   HGBA1C 6.5 (H) 10/09/2020      Assessment & Plan:  [name] was seen today for follow-up.  Diagnoses and all orders for this visit:  Essential hypertension Counseled on blood pressure goal of less than 130/80, low-sodium, DASH diet, medication compliance, 150 minutes of moderate intensity exercise per week. Discussed medication compliance, adverse effects.( improving)  Gastroesophageal reflux disease without esophagitis Gastroparesis is also called delayed gastric emptying it consist of weak muscular contractions (peristalsis) of the stomach resulting in food and liquid remaining in the  stomach for prolonged periods of time.  Stomach contents does exist more slowly into the duodenum digestive tract.  Symptoms can include nausea vomiting abdominal pain feeling full soon after or beginning to eat, abdominal bloating and heartburn.  Muscle spasm intermittent muscle spasm left arm and feeling of numbness -     tiZANidine (ZANAFLEX) 2 MG tablet; Take 1 tablet (2 mg total) by mouth every 6 (six) hours as needed for muscle spasms.  Medication refill -     tiZANidine (ZANAFLEX) 2 MG tablet; Take 1 tablet (2 mg total) by mouth every 6 (six) hours as needed for muscle spasms. -     amLODipine (NORVASC) 10 MG tablet; Take 1 tablet (10 mg total) by mouth daily. -     losartan-hydrochlorothiazide (HYZAAR) 100-25 MG tablet; Take 1 tablet by mouth daily.     Follow-up: Return in about 6 months (around 05/14/2021) for BP/muscle spasm right arm.    [name], NP

## 2020-11-16 ENCOUNTER — Ambulatory Visit (INDEPENDENT_AMBULATORY_CARE_PROVIDER_SITE_OTHER): Payer: Medicare HMO | Admitting: Family Medicine

## 2020-12-07 ENCOUNTER — Ambulatory Visit: Payer: Medicare HMO | Admitting: Nurse Practitioner

## 2020-12-27 ENCOUNTER — Ambulatory Visit: Payer: Medicare HMO

## 2020-12-29 ENCOUNTER — Ambulatory Visit
Admission: RE | Admit: 2020-12-29 | Discharge: 2020-12-29 | Disposition: A | Discharge status: Home / Self Care | Payer: Medicare HMO | Source: Ambulatory Visit | Attending: Primary Care | Admitting: Primary Care

## 2020-12-29 ENCOUNTER — Other Ambulatory Visit: Payer: Self-pay

## 2020-12-29 DIAGNOSIS — Z1231 Encounter for screening mammogram for malignant neoplasm of breast: Secondary | ICD-10-CM

## 2021-01-04 ENCOUNTER — Ambulatory Visit (INDEPENDENT_AMBULATORY_CARE_PROVIDER_SITE_OTHER): Payer: Medicare HMO | Admitting: Nurse Practitioner

## 2021-01-04 ENCOUNTER — Encounter: Payer: Self-pay | Admitting: Nurse Practitioner

## 2021-01-04 ENCOUNTER — Other Ambulatory Visit: Payer: Self-pay

## 2021-01-04 VITALS — BP 116/78 | HR 94 | Ht 61.0 in | Wt 119.0 lb

## 2021-01-04 DIAGNOSIS — R112 Nausea with vomiting, unspecified: Secondary | ICD-10-CM | POA: Diagnosis not present

## 2021-01-04 NOTE — Progress Notes (Signed)
ASSESSMENT AND PLAN    #65 year old female with chronic intermittent nausea and vomiting of unclear etiology but resolved on daily PPI and with dietary changes. EGD with biopsies in May 2022 with findings of mild chronic gastritis without H.pylori.  --Continue daily PPI --Follow-up in 1 year or sooner if needed.   HISTORY OF PRESENT ILLNESS    Chief Complaint : follow up on nausea and vomiting.   [name] is a 65 y.o. female known to Dr. Fuller Plan  with a past medical history significant for diverticulosis , adenomatous colon polyps, TIA, HTN, hyperlipedemia, diabetes, cholecystectomy. See PMH below for any additional medical problems.    Patient was seen early May of this year for evaluation of worsening of chronic nausea and vomiting.  CT scan of the abdomen and pelvis with contrast for abdominal pain and vomiting during an April admission was unrevealing.  For further evaluation nausea and vomiting patient underwent EGD with gastric biopsies.  The study was unremarkable except for gastritis and a small hiatal hernia.  She returns for follow-up     INTERVAL HISTORY: Patient says she feels a lot better . She has cut back on carbonated drinks, chocolates and fried foods. She is taking daily Protonix prior to breakfast. 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   Current Outpatient Medications  Medication Sig Dispense Refill   amLODipine (NORVASC) 10 MG tablet Take 1 tablet (10 mg total) by mouth daily. 90 tablet 1   losartan-hydrochlorothiazide (HYZAAR) 100-25 MG tablet Take 1 tablet by mouth daily. 90 tablet 3   pantoprazole (PROTONIX) 40 MG tablet Take 1 tablet (40 mg total) by mouth daily. 90 tablet 3   tiZANidine (ZANAFLEX) 2 MG tablet Take 1 tablet (2 mg total) by mouth every 6 (six) hours as needed for muscle spasms. 60 tablet 1   vitamin B-12 (CYANOCOBALAMIN) 1000 MCG tablet Take 1 tablet (1,000 mcg total) by mouth daily. 30 tablet 0   No current facility-administered medications for this visit.    Review of Systems: No chest pain. No shortness of breath. No urinary complaints.   PHYSICAL EXAM :    Wt Readings from Last 3 Encounters:  01/04/21 119 lb (54 kg)  11/11/20 111 lb 6.4 oz (50.5 kg)  10/27/20 109 lb (49.4 kg)    BP 116/78   Pulse 94   Ht 5\' 1"  (1.549 m)   Wt 119 lb (54 kg)   BMI 22.48 kg/m  Constitutional:  Pleasant thin female in no acute distress. Psychiatric: Normal mood and affect. Behavior is normal. EENT: Pupils normal.  Conjunctivae are normal. No scleral icterus. Neck supple.  Cardiovascular: Normal rate, regular rhythm. No edema Pulmonary/chest: Effort normal and breath sounds normal. No wheezing, rales  or rhonchi. Abdominal: Soft, nondistended, nontender. Bowel sounds active throughout. There are no masses palpable. No hepatomegaly. Neurological: Alert and oriented to person place and time. Skin: Skin is warm and dry. No rashes noted.  Tye Savoy, NP  01/04/2021, 3:23 PM

## 2021-01-04 NOTE — Progress Notes (Signed)
Reviewed and agree with management plan.  Shanita Kanan T. Tajh Livsey, MD FACG [phone]  

## 2021-01-04 NOTE — Patient Instructions (Signed)
Continue Protonix daily  Follow up in 1 year  If you are age 66 or older, your body mass index should be between 23-30. Your Body mass index is 22.48 kg/m. If this is out of the aforementioned range listed, please consider follow up with your Primary Care Provider.  If you are age 6 or younger, your body mass index should be between 19-25. Your Body mass index is 22.48 kg/m. If this is out of the aformentioned range listed, please consider follow up with your Primary Care Provider.   __________________________________________________________  The [place] [facility] providers would like to encourage you to use [facility] to communicate with providers for non-urgent requests or questions.  Due to long hold times on the telephone, sending your provider a message by [facility] may be a faster and more efficient way to get a response.  Please allow 48 business hours for a response.  Please remember that this is for non-urgent requests.    I appreciate the  opportunity to care for you  Thank You   [place]

## 2021-01-17 ENCOUNTER — Telehealth (INDEPENDENT_AMBULATORY_CARE_PROVIDER_SITE_OTHER): Payer: Medicare HMO | Admitting: Primary Care

## 2021-02-03 ENCOUNTER — Ambulatory Visit (INDEPENDENT_AMBULATORY_CARE_PROVIDER_SITE_OTHER): Payer: Medicare HMO | Admitting: Nurse Practitioner

## 2021-02-03 ENCOUNTER — Other Ambulatory Visit: Payer: Self-pay

## 2021-02-03 ENCOUNTER — Encounter (INDEPENDENT_AMBULATORY_CARE_PROVIDER_SITE_OTHER): Payer: Self-pay | Admitting: Nurse Practitioner

## 2021-02-03 VITALS — BP 148/83 | HR 77 | Temp 97.5°F | Ht 61.0 in | Wt 120.6 lb

## 2021-02-03 DIAGNOSIS — R200 Anesthesia of skin: Secondary | ICD-10-CM

## 2021-02-03 DIAGNOSIS — R202 Paresthesia of skin: Secondary | ICD-10-CM

## 2021-02-03 DIAGNOSIS — G5602 Carpal tunnel syndrome, left upper limb: Secondary | ICD-10-CM | POA: Diagnosis not present

## 2021-02-03 MED ORDER — PREDNISONE 20 MG PO TABS
20.0000 mg | ORAL_TABLET | Freq: Every day | ORAL | 0 refills | Status: AC
Start: 2021-02-03 — End: 2021-02-08

## 2021-02-03 NOTE — Progress Notes (Signed)
Left hand numbness/tingling especially in the mornings; radiates to fingertips

## 2021-02-03 NOTE — Assessment & Plan Note (Signed)
   May trial hand brace  Elevate and rest wrist when possible  Will order prednisone  Follow up:  Follow up in 3 months with Juluis Mire

## 2021-02-03 NOTE — Patient Instructions (Addendum)
Carpal tunnel left wrist:  May trial hand brace  Elevate and rest wrist when possible  Will order prednisone  Follow up:  Follow up in 3 months with Juluis Mire        Carpal Tunnel Syndrome  Carpal tunnel syndrome is a condition that causes pain, weakness, and numbness in your hand and arm. Numbness is when you cannot feel an area in your body. The carpal tunnel is a narrow area that is on the palm side of your wrist. Repeated wrist motion or certain diseases may cause swelling in the tunnel. This swelling can pinch the main nerve in the wrist. This nerve is called themedian nerve. What are the causes? This condition may be caused by: Moving your hand and wrist over and over again while doing a task. Injury to the wrist. Arthritis. A sac of fluid (cyst) or abnormal growth (tumor) in the carpal tunnel. Fluid buildup during pregnancy. Use of tools that vibrate. Sometimes the cause is not known. What increases the risk? The following factors may make you more likely to have this condition: Having a job that makes you do these things: Move your hand over and over again. Work with tools that vibrate, such as drills or sanders. Being a woman. Having diabetes, obesity, thyroid problems, or kidney failure. What are the signs or symptoms? Symptoms of this condition include: A tingling feeling in your fingers. Tingling or loss of feeling in your hand. Pain in your entire arm. This pain may get worse when you bend your wrist and elbow for a long time. Pain in your wrist that goes up your arm to your shoulder. Pain that goes down into your palm or fingers. Weakness in your hands. You may find it hard to grab and hold items. You may feel worse at night. How is this treated? This condition may be treated with: Lifestyle changes. You will be asked to stop or change the activity that caused your problem. Doing exercises and activities that make bones, muscles, and tendons  stronger (physical therapy). Learning how to use your hand again (occupational therapy). Medicines for pain and swelling. You may have injections in your wrist. A wrist splint or brace. Surgery. Follow these instructions at home: If you have a splint or brace: Wear the splint or brace as told by your doctor. Take it off only as told by your doctor. Loosen the splint if your fingers: Tingle. Become numb. Turn cold and blue. Keep the splint or brace clean. If the splint or brace is not waterproof: Do not let it get wet. Cover it with a watertight covering when you take a bath or a shower. Managing pain, stiffness, and swelling If told, put ice on the painful area: If you have a removable splint or brace, remove it as told by your doctor. Put ice in a plastic bag. Place a towel between your skin and the bag. Leave the ice on for 20 minutes, 2-3 times per day. Do not fall asleep with the cold pack on your skin. Take off the ice if your skin turns bright red. This is very important. If you cannot feel pain, heat, or cold, you have a greater risk of damage to the area. Move your fingers often to reduce stiffness and swelling. General instructions Take over-the-counter and prescription medicines only as told by your doctor. Rest your wrist from any activity that may cause pain. If needed, talk with your boss at work about changes that can help your wrist  heal. Do exercises as told by your doctor, physical therapist, or occupational therapist. Keep all follow-up visits. Contact a doctor if: You have new symptoms. Medicine does not help your pain. Your symptoms get worse. Get help right away if: You have very bad numbness or tingling in your wrist or hand. Summary Carpal tunnel syndrome is a condition that causes pain in your hand and arm. It is often caused by repeated wrist motions. Lifestyle changes and medicines are used to treat this problem. Surgery may help in very bad  cases. Follow your doctor's instructions about wearing a splint, resting your wrist, keeping follow-up visits, and calling for help. This information is not intended to replace advice given to you by your health care provider. Make sure you discuss any questions you have with your healthcare provider. Document Revised: 10/22/2019 Document Reviewed: 10/22/2019 Elsevier Patient Education  [employer].

## 2021-02-03 NOTE — Progress Notes (Signed)
$'@Patient'a$  ID: [name], female    DOB: 01/08/1956, 65 y.o.   MRN: [id number]  Chief Complaint  Patient presents with   Tingling    With numbness in Left hand    Referring provider: [name], NP   HPI  Patient presents today for acute visit.  She states that over the past couple months she has been having pain tingling and numbness to her left wrist and hand.  She states that the numbness is mainly in her thumb and first 3 fingers.  Patient has been diagnosed with carpal tunnel to the left wrist several years ago.  At that time her right wrist was worse and she had surgery on the right wrist.  She failed to follow-up for her left wrist at that time.  This was due to loss of insurance.  Since the pain and tingling have returned and are worsening patient is requesting referral back to neurology. Denies f/c/s, n/v/d, hemoptysis, PND, chest pain or edema.      No Known Allergies  Immunization History  Administered Date(s) Administered   Influenza,inj,Quad PF,6+ Mos 04/25/2017, 05/02/2020   PFIZER(Purple Top)SARS-COV-2 Vaccination 01/15/2020, 02/05/2020   PPD Test 04/11/2015, 06/29/2016   Pneumococcal Conjugate-13 01/29/2020   Tdap 04/25/2017    Past Medical History:  Diagnosis Date   Anemia    hx   Aneurysm ([place])    small, on left side of brain    Arthritis    "all over" (06/19/2017)   Diabetes mellitus without complication ([place])    diet control, pt denies   Hyperlipidemia    Hypertension    TIA (transient ischemic attack)    pt unaware of this hx on 06/19/2017    Tobacco History: Social History   Tobacco Use  Smoking Status Never  Smokeless Tobacco Never   Counseling given: Not Answered   Outpatient Encounter Medications as of 02/03/2021  Medication Sig   amLODipine (NORVASC) 10 MG tablet Take 1 tablet (10 mg total) by mouth daily.   losartan-hydrochlorothiazide (HYZAAR) 100-25 MG tablet Take 1 tablet by mouth daily.   pantoprazole  (PROTONIX) 40 MG tablet Take 1 tablet (40 mg total) by mouth daily.   predniSONE (DELTASONE) 20 MG tablet Take 1 tablet (20 mg total) by mouth daily with breakfast for 5 days.   tiZANidine (ZANAFLEX) 2 MG tablet Take 1 tablet (2 mg total) by mouth every 6 (six) hours as needed for muscle spasms.   vitamin B-12 (CYANOCOBALAMIN) 1000 MCG tablet Take 1 tablet (1,000 mcg total) by mouth daily.   No facility-administered encounter medications on file as of 02/03/2021.     Review of Systems  Review of Systems  Constitutional: Negative.   HENT: Negative.    Respiratory: Negative.    Cardiovascular: Negative.   Gastrointestinal: Negative.   Allergic/Immunologic: Negative.   Neurological:  Positive for numbness (numbness and tingling to left hand and wrist).  Psychiatric/Behavioral: Negative.        Physical Exam  BP (!) 148/83 (BP Location: Right Arm, Patient Position: Sitting, Cuff Size: Normal)   Pulse 77   Temp (!) 97.5 F (36.4 C) (Temporal)   Ht '5\' 1"'$  (1.549 m)   Wt 120 lb 9.6 oz (54.7 kg)   SpO2 100%   BMI 22.79 kg/m   Wt Readings from Last 5 Encounters:  02/03/21 120 lb 9.6 oz (54.7 kg)  01/04/21 119 lb (54 kg)  11/11/20 111 lb 6.4 oz (50.5 kg)  10/27/20 109 lb (49.4 kg)  10/24/20 109 lb (49.4 kg)     Physical Exam Vitals and nursing note reviewed.  Constitutional:      General: She is not in acute distress.    Appearance: She is well-developed.  Cardiovascular:     Rate and Rhythm: Normal rate and regular rhythm.  Pulmonary:     Effort: Pulmonary effort is normal.     Breath sounds: Normal breath sounds.  Musculoskeletal:     Right wrist: No swelling.     Left wrist: No swelling or deformity. Decreased range of motion.     Right hand: No swelling. Normal range of motion.     Left hand: No swelling. Normal range of motion. Decreased strength. Normal pulse.     Right lower leg: No edema.     Left lower leg: No edema.  Neurological:     Mental Status: She is  alert and oriented to person, place, and time.      Assessment & Plan:   Carpal tunnel syndrome of left wrist   May trial hand brace  Elevate and rest wrist when possible  Will order prednisone  Follow up:  Follow up in 3 months with [name]     Fenton Foy, NP 02/03/2021

## 2021-02-20 ENCOUNTER — Institutional Professional Consult (permissible substitution): Payer: Medicare HMO | Admitting: Diagnostic Neuroimaging

## 2021-02-20 ENCOUNTER — Telehealth: Payer: Self-pay | Admitting: Diagnostic Neuroimaging

## 2021-02-20 NOTE — Telephone Encounter (Signed)
Pt had to cancel appt, due to not having the funds for her copay. Please advise.

## 2021-03-13 ENCOUNTER — Other Ambulatory Visit: Payer: Self-pay

## 2021-03-13 ENCOUNTER — Other Ambulatory Visit (HOSPITAL_COMMUNITY): Payer: Self-pay

## 2021-03-20 ENCOUNTER — Other Ambulatory Visit: Payer: Self-pay

## 2021-03-20 ENCOUNTER — Other Ambulatory Visit (HOSPITAL_COMMUNITY): Payer: Self-pay

## 2021-03-22 ENCOUNTER — Other Ambulatory Visit: Payer: Self-pay

## 2021-03-22 ENCOUNTER — Encounter (HOSPITAL_COMMUNITY): Payer: Self-pay | Admitting: Emergency Medicine

## 2021-03-22 ENCOUNTER — Telehealth: Payer: Self-pay | Admitting: Primary Care

## 2021-03-22 ENCOUNTER — Emergency Department (HOSPITAL_COMMUNITY): Payer: Medicare Other

## 2021-03-22 ENCOUNTER — Emergency Department (HOSPITAL_COMMUNITY)
Admission: EM | Admit: 2021-03-22 | Discharge: 2021-03-22 | Disposition: A | Discharge status: Home / Self Care | Payer: Medicare Other | Attending: Emergency Medicine | Admitting: Emergency Medicine

## 2021-03-22 DIAGNOSIS — M5412 Radiculopathy, cervical region: Secondary | ICD-10-CM | POA: Diagnosis not present

## 2021-03-22 DIAGNOSIS — R531 Weakness: Secondary | ICD-10-CM | POA: Diagnosis not present

## 2021-03-22 DIAGNOSIS — E119 Type 2 diabetes mellitus without complications: Secondary | ICD-10-CM | POA: Insufficient documentation

## 2021-03-22 DIAGNOSIS — R202 Paresthesia of skin: Secondary | ICD-10-CM | POA: Insufficient documentation

## 2021-03-22 DIAGNOSIS — I10 Essential (primary) hypertension: Secondary | ICD-10-CM | POA: Diagnosis not present

## 2021-03-22 DIAGNOSIS — Z79899 Other long term (current) drug therapy: Secondary | ICD-10-CM | POA: Insufficient documentation

## 2021-03-22 DIAGNOSIS — Z8673 Personal history of transient ischemic attack (TIA), and cerebral infarction without residual deficits: Secondary | ICD-10-CM | POA: Diagnosis not present

## 2021-03-22 DIAGNOSIS — R200 Anesthesia of skin: Secondary | ICD-10-CM

## 2021-03-22 LAB — CBC WITH DIFFERENTIAL/PLATELET
Abs Immature Granulocytes: 0.07 10*3/uL (ref 0.00–0.07)
Basophils Absolute: 0.1 10*3/uL (ref 0.0–0.1)
Basophils Relative: 1 %
Eosinophils Absolute: 0.1 10*3/uL (ref 0.0–0.5)
Eosinophils Relative: 1 %
HCT: 38.1 % (ref 36.0–46.0)
Hemoglobin: 12.1 g/dL (ref 12.0–15.0)
Immature Granulocytes: 1 %
Lymphocytes Relative: 25 %
Lymphs Abs: 2.2 10*3/uL (ref 0.7–4.0)
MCH: 27.6 pg (ref 26.0–34.0)
MCHC: 31.8 g/dL (ref 30.0–36.0)
MCV: 86.8 fL (ref 80.0–100.0)
Monocytes Absolute: 0.4 10*3/uL (ref 0.1–1.0)
Monocytes Relative: 5 %
Neutro Abs: 6 10*3/uL (ref 1.7–7.7)
Neutrophils Relative %: 67 %
Platelets: 342 10*3/uL (ref 150–400)
RBC: 4.39 MIL/uL (ref 3.87–5.11)
RDW: 13.7 % (ref 11.5–15.5)
WBC: 8.8 10*3/uL (ref 4.0–10.5)
nRBC: 0 % (ref 0.0–0.2)

## 2021-03-22 LAB — COMPREHENSIVE METABOLIC PANEL
ALT: 8 U/L (ref 0–44)
AST: 14 U/L — ABNORMAL LOW (ref 15–41)
Albumin: 3.8 g/dL (ref 3.5–5.0)
Alkaline Phosphatase: 84 U/L (ref 38–126)
Anion gap: 9 (ref 5–15)
BUN: 9 mg/dL (ref 8–23)
CO2: 24 mmol/L (ref 22–32)
Calcium: 9.3 mg/dL (ref 8.9–10.3)
Chloride: 107 mmol/L (ref 98–111)
Creatinine, Ser: 0.62 mg/dL (ref 0.44–1.00)
GFR, Estimated: >60 mL/min (ref >60)
Glucose, Bld: 116 mg/dL — ABNORMAL HIGH (ref 70–99)
Potassium: 3.7 mmol/L (ref 3.5–5.1)
Sodium: 140 mmol/L (ref 135–145)
Total Bilirubin: 0.3 mg/dL (ref 0.3–1.2)
Total Protein: 7.1 g/dL (ref 6.5–8.1)

## 2021-03-22 LAB — TROPONIN I (HIGH SENSITIVITY): Troponin I (High Sensitivity): 7 ng/L (ref <18)

## 2021-03-22 MED ORDER — METHYLPREDNISOLONE 4 MG PO TBPK
ORAL_TABLET | ORAL | 0 refills | Status: DC
  Filled 2021-03-22: qty 21, 6d supply, fill #0

## 2021-03-22 NOTE — Telephone Encounter (Signed)
I spoke with patient this morning to schedule AWV.  She stated she was leaving now to go to [place].  She has been having numbness in left arm  for some time she stated she thought it was carpel tunnel.  This morning she had pain along with numbness.

## 2021-03-22 NOTE — ED Provider Notes (Signed)
[facility] EMERGENCY DEPARTMENT Provider Note   CSN: [id number] Arrival date & time: 03/22/21  6384     History Chief Complaint  Patient presents with   Numbness    [name] is a 65 y.o. female.  HPI     65 year old female with a history of diabetes, hypertension, hyperlipidemia, TIA per chart, aneurysm, carpal tunnel presents with concern for numbness.  Reports having history of carpal tunnel surgery on the right side, and has had some intermittent symptoms to her left hand while sleeping in the past that improved after waking up and stretching, but now has had persistent numbness of her left hand over her thumb, index middle and ring fingers.  Reports she also began to develop pain in her hand.  In addition to the symptoms reports that she will at times drop things with her left hand.  Today, she developed a pain that moved from her hand up her arm.  She has not noticed any headache, increased neck pain, other areas of numbness, weakness, facial droop, visual changes difficulty walking or talking.  She denies any chest pain or shortness of breath.  The pain through her left arm felt sudden and shooting and resolved.  Past Medical History:  Diagnosis Date   Anemia    hx   Aneurysm ([place])    small, on left side of brain    Arthritis    "all over" (06/19/2017)   Diabetes mellitus without complication ([place])    diet control, pt denies   Hyperlipidemia    Hypertension    TIA (transient ischemic attack)    pt unaware of this hx on 06/19/2017    Patient Active Problem List   Diagnosis Date Noted   Numbness and tingling of left hand 02/03/2021   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   Sepsis due to undetermined organism ([place]) 10/10/2020   Enteritis of infectious origin 10/09/2020   Diabetes mellitus type 2 in nonobese ([place]) 10/09/2020   Bilateral impacted cerumen 04/02/2019   Conductive hearing loss, bilateral 04/02/2019   Nausea and  vomiting 05/17/2018   Enteritis 06/19/2017   Abdominal pain 06/18/2017   Hyperlipidemia 06/18/2017   Elevated glucose 06/18/2017   Need for hepatitis C screening test 05/23/2017   Breast pain, left 01/05/2016   Left ankle sprain 01/20/2015   Gastroenteritis 11/08/2014   Malaise 10/14/2014   Hypokalemia 10/14/2014   Essential hypertension    Bilateral wrist pain 10/06/2014   Hot flashes, menopausal 10/06/2014   Cerebral aneurysm without rupture 08/20/2014   TIA (transient ischemic attack) 08/19/2014   Accelerated hypertension 02/24/2013   Depression 02/24/2013    Past Surgical History:  Procedure Laterality Date   ANTERIOR CERVICAL DECOMP/DISCECTOMY [place]   FOOT SURGERY Left 11/2019   IR GENERIC HISTORICAL  09/21/2014   IR ANGIO INTRA EXTRACRAN SEL INTERNAL CAROTID UNI L MOD SED 09/21/2014 [name], MD [facility]   IR GENERIC HISTORICAL  09/21/2014   IR ANGIO INTRA EXTRACRAN SEL COM CAROTID INNOMINATE UNI R MOD SED 09/21/2014 [name], MD [facility]   IR GENERIC HISTORICAL  09/21/2014   IR ANGIO VERTEBRAL SEL VERTEBRAL UNI L MOD SED 09/21/2014 [name], MD [facility]   IR GENERIC HISTORICAL  09/21/2014   IR 3D INDEPENDENT WKST 09/21/2014 [name], MD [facility]   LAPAROSCOPIC CHOLECYSTECTOMY     TUBAL LIGATION  OB History     Gravida  2   Para  1   Term  1   Preterm      AB  1   Living  1      SAB  1   IAB      Ectopic      Multiple      Live Births  1           Family History  Problem Relation Age of Onset   Hypertension Mother    Arthritis Mother    Diabetes Mother    Dementia Mother    Heart disease Father    Diabetes Sister    Diabetes Brother    Diabetes Sister    Cancer Neg Hx    Colon cancer Neg Hx    Colon polyps Neg Hx    Esophageal cancer Neg Hx    Rectal cancer Neg Hx    Stomach cancer Neg Hx     Social History   Tobacco Use    Smoking status: Never   Smokeless tobacco: Never  Vaping Use   Vaping Use: Never used  Substance Use Topics   Alcohol use: Yes    Alcohol/week: 3.0 standard drinks    Types: 3 Standard drinks or equivalent per week    Comment: socially    Drug use: Not Currently    Home Medications Prior to Admission medications   Medication Sig Start Date End Date Taking? Authorizing Provider  methylPREDNISolone (MEDROL DOSEPAK) 4 MG TBPK tablet Take as directed 03/22/21  Yes Opie Maclaughlin, Junie Panning, MD  amLODipine (NORVASC) 10 MG tablet Take 1 tablet (10 mg total) by mouth daily. 11/11/20   [name], NP  losartan-hydrochlorothiazide (HYZAAR) 100-25 MG tablet Take 1 tablet by mouth daily. 11/11/20   [name], NP  pantoprazole (PROTONIX) 40 MG tablet Take 1 tablet (40 mg total) by mouth daily. 10/27/20   Ladene Artist, MD  tiZANidine (ZANAFLEX) 2 MG tablet Take 1 tablet (2 mg total) by mouth every 6 (six) hours as needed for muscle spasms. 11/11/20   [name], NP  vitamin B-12 (CYANOCOBALAMIN) 1000 MCG tablet Take 1 tablet (1,000 mcg total) by mouth daily. 10/15/20   Lavina Hamman, MD    Allergies    Patient has no known allergies.  Review of Systems   Review of Systems  Constitutional:  Negative for fever.  HENT:  Negative for sore throat.   Eyes:  Negative for visual disturbance.  Respiratory:  Negative for cough and shortness of breath.   Cardiovascular:  Negative for chest pain.  Gastrointestinal:  Positive for nausea. Negative for abdominal pain and vomiting.  Genitourinary:  Negative for difficulty urinating.  Musculoskeletal:  Positive for arthralgias. Negative for back pain and neck pain.  Skin:  Negative for rash.  Neurological:  Positive for weakness (denies on history although reports dropping things iwth lef thand at times) and numbness. Negative for dizziness, syncope, facial asymmetry, speech difficulty and headaches.   Physical Exam Updated Vital  Signs BP (!) 193/117 (BP Location: Right Arm) Comment: Pt states that she has not taken her BP meds today  Pulse 83   Temp 98.1 F (36.7 C) (Oral)   Resp 16   SpO2 100%   Physical Exam Vitals and nursing note reviewed.  Constitutional:      General: She is not in acute distress.    Appearance: Normal appearance. She is well-developed. She is not ill-appearing or diaphoretic.  HENT:     Head: Normocephalic and atraumatic.  Eyes:     General: No visual field deficit.    Extraocular Movements: Extraocular movements intact.     Conjunctiva/sclera: Conjunctivae normal.     Pupils: Pupils are equal, round, and reactive to light.  Cardiovascular:     Rate and Rhythm: Normal rate and regular rhythm.     Pulses: Normal pulses.     Heart sounds: Normal heart sounds. No murmur heard.   No friction rub. No gallop.  Pulmonary:     Effort: Pulmonary effort is normal. No respiratory distress.     Breath sounds: Normal breath sounds. No wheezing or rales.  Abdominal:     General: There is no distension.     Palpations: Abdomen is soft.     Tenderness: There is no abdominal tenderness. There is no guarding.  Musculoskeletal:        General: No swelling or tenderness.     Cervical back: Normal range of motion.  Skin:    General: Skin is warm and dry.     Findings: No erythema or rash.  Neurological:     General: No focal deficit present.     Mental Status: She is alert and oriented to person, place, and time.     GCS: GCS eye subscore is 4. GCS verbal subscore is 5. GCS motor subscore is 6.     Cranial Nerves: No cranial nerve deficit, dysarthria or facial asymmetry.     Sensory: No sensory deficit.     Motor: Weakness (pronator drift, does drift on left then lifts up again. weakn wrist extension, normal abduction of fingers, normal distal arm strength, normal opponens, numbness thumb-ring finger on left) present. No tremor.     Coordination: Coordination normal. Finger-Nose-Finger Test  normal.     Gait: Gait normal.    ED Results / Procedures / Treatments   Labs (all labs ordered are listed, but only abnormal results are displayed) Labs Reviewed  COMPREHENSIVE METABOLIC PANEL - Abnormal; Notable for the following components:      Result Value   Glucose, Bld 116 (*)    AST 14 (*)    All other components within normal limits  CBC WITH DIFFERENTIAL/PLATELET  TROPONIN I (HIGH SENSITIVITY)  TROPONIN I (HIGH SENSITIVITY)    EKG EKG Interpretation  Date/Time:  Wednesday March 22 2021 11:40:32 EDT Ventricular Rate:  69 PR Interval:  159 QRS Duration: 81 QT Interval:  375 QTC Calculation: 402 R Axis:   -20 Text Interpretation: Sinus rhythm Borderline left axis deviation Low voltage, precordial leads Abnormal R-wave progression, late transition No significant change since last tracing Confirmed by Gareth Morgan [phone]) on 03/22/2021 1:07:24 PM  Radiology MR BRAIN WO CONTRAST  Result Date: 03/22/2021 CLINICAL DATA:  Left finger numbness, tingling for a month, going up to left arm and shoulder this morning EXAM: MRI HEAD WITHOUT CONTRAST TECHNIQUE: Multiplanar, multiecho pulse sequences of the brain and surrounding structures were obtained without intravenous contrast. COMPARISON:  Brain MRI 12/04/2017 FINDINGS: Brain: There is no acute intracranial hemorrhage, extra-axial fluid collection, or acute infarct. Scattered foci of FLAIR signal abnormality throughout the subcortical and periventricular white matter are nonspecific but likely reflects sequela of chronic white matter microangiopathy. The ventricles are stable in size. There is no mass lesion. There is no midline shift. Vascular: Normal flow voids. Skull and upper cervical spine: Normal marrow signal. Sinuses/Orbits: The paranasal sinuses are clear. The globes and orbits are unremarkable. Other: None. IMPRESSION:  1. No acute intracranial pathology. 2. Moderate chronic white matter microangiopathy. Electronically  Signed   By: Valetta Mole M.D.   On: 03/22/2021 13:28   MR Cervical Spine Wo Contrast  Result Date: 03/22/2021 CLINICAL DATA:  Cervical radiculopathy, infection suspected left arm weakness at shoulder, wrist extension. Left numbness and tingling for 1 month. Remote history of cervical fusion EXAM: MRI CERVICAL SPINE WITHOUT CONTRAST TECHNIQUE: Multiplanar, multisequence MR imaging of the cervical spine was performed. No intravenous contrast was administered. COMPARISON:  X-ray 05/25/2006 FINDINGS: Alignment: 2 mm grade 1 anterolisthesis C7 on T1. Trace retrolisthesis C5 on C6. Straightening of the upper cervical lordosis. Vertebrae: Prior C6-7 ACDF with solid bony fusion of the vertebral bodies. No acute fracture. No evidence of discitis. No suspicious bone lesion. Cord: Normal signal and morphology. Posterior Fossa, vertebral arteries, paraspinal tissues: Negative. Disc levels: C2-C3: No disc protrusion. Minimal right greater than left facet arthropathy. No foraminal or canal stenosis. C3-C4: Mild disc osteophyte complex, eccentric to left with left foraminal protrusion. Bilateral facet arthropathy, left worse than right. Findings result in moderate to severe left foraminal stenosis. Borderline-mild canal stenosis. C4-C5: Mild disc osteophyte complex with bilateral facet and uncovertebral arthropathy resulting in mild bilateral foraminal stenosis with mild canal stenosis. C5-C6: Disc osteophyte complex with bilateral facet uncovertebral arthropathy resulting in moderate canal stenosis with moderate to severe right and moderate left foraminal stenosis. C6-C7: Prior ACDF. No evidence of residual foraminal or canal stenosis. C7-T1: Anterolisthesis with disc uncovering. Advanced right worse than left facet arthropathy. There is bilateral foraminal stenosis, evaluation of which is limited secondary to adjacent susceptibility artifact from patient's fusion hardware. T1-T2: Diffuse disc bulge, eccentric to the right  with bilateral facet arthropathy results in at least moderate right-sided foraminal stenosis. No significant canal stenosis. IMPRESSION: 1. Multilevel cervical spondylosis, most pronounced at the C5-6 level where there is moderate canal stenosis with moderate to severe right and moderate left foraminal stenosis. 2. Mild canal stenosis and mild bilateral foraminal stenosis at C4-5. 3. At least moderate right foraminal stenosis at T1-T2. 4. 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 No evidence of residual foraminal or canal stenosis at this level. Electronically Signed   By: Davina Poke D.O.   On: 03/22/2021 13:36    Procedures Procedures   Medications Ordered in ED Medications - No data to display  ED Course  I have reviewed the triage vital signs and the nursing notes.  Pertinent labs & imaging results that were available during my care of the patient were reviewed by me and considered in my medical decision making (see chart for details).    MDM Rules/Calculators/A&P                            64 year old female with a history of diabetes, hypertension, hyperlipidemia, TIA per chart, aneurysm, carpal tunnel presents with concern for numbness.  Overall history and location seem consistent with likely carpal tunnel, however on exam she is noted to have weakness of her left wrist and weakness with shoulder abduction.  Given unclear timing of this weakness, she is not a candidate for a code stroke.  Suspect her symptoms are more likely related to a cervical radiculopathy given symptoms in shoulder, wrist, and suspect lesion of C5-C6, however with risk factors for intracranial etiology and concern for some pronator drift on exam will evaluate with MRI brain as well as MRI cervical spine to evaluate for  possible CVA or intracranial abnormality and evaluate for spinal cord abnormalities.  Labs and ECG obtained show no evidence of ACS or significant electrolyte abnormalities,  low suspicion for dissection by hx and exam.. No significant electrolyte abnormalities as etiology of numbness.  MRI pending.   MRI shows cervical spondylosis C5-C6 level wth moderate canal stenosis and moderate to severe right and mod left foraminal stenosis.  Discussed with [facility] Dr Ronnald Ramp. GIven medrol dosepack and recommend outpatient follow up.    Final Clinical Impression(s) / ED Diagnoses Final diagnoses:  Left sided numbness  Weakness  Cervical radiculopathy    Rx / DC Orders ED Discharge Orders          Ordered    methylPREDNISolone (MEDROL DOSEPAK) 4 MG TBPK tablet        03/22/21 1455             Gareth Morgan, MD 03/22/21 2203

## 2021-03-22 NOTE — ED Notes (Signed)
Patient transported to MRI 

## 2021-03-22 NOTE — ED Notes (Signed)
Labs sent.  L.green, d.green, blue, lav tubes

## 2021-03-22 NOTE — ED Triage Notes (Signed)
Patient complains of worsening numbness in left arm that started one month ago and is primarily in her fingertips but is also intermittently in her left shoulder. Denies chest pain, denies shortness of breath, denies nausea/vomiting. Reports being diagnosed with carpal tunnel syndrome a few years ago and was told she needed to have surgery but has not had it treated.

## 2021-03-27 ENCOUNTER — Encounter: Payer: Self-pay | Admitting: Neurology

## 2021-04-06 ENCOUNTER — Ambulatory Visit: Payer: Medicare Other | Attending: Physician Assistant | Admitting: Physician Assistant

## 2021-04-06 ENCOUNTER — Other Ambulatory Visit: Payer: Self-pay

## 2021-04-06 ENCOUNTER — Encounter: Payer: Self-pay | Admitting: Physician Assistant

## 2021-04-06 VITALS — BP 178/97 | HR 83 | Temp 98.2°F | Resp 18 | Ht 61.0 in | Wt 119.0 lb

## 2021-04-06 DIAGNOSIS — R200 Anesthesia of skin: Secondary | ICD-10-CM

## 2021-04-06 DIAGNOSIS — G5602 Carpal tunnel syndrome, left upper limb: Secondary | ICD-10-CM | POA: Diagnosis not present

## 2021-04-06 DIAGNOSIS — R202 Paresthesia of skin: Secondary | ICD-10-CM

## 2021-04-06 NOTE — Progress Notes (Signed)
Established Patient Office Visit  Subjective:  Patient ID: [name], female    DOB: Jan 13, 1956  Age: 65 y.o. MRN: [id number]  CC:  Chief Complaint  Patient presents with   left hand pain    HPI [name] reports that she was seen in the emergency department on March 22, 2021.  Hospital note 65 year old female with a history of diabetes, hypertension, hyperlipidemia, TIA per chart, aneurysm, carpal tunnel presents with concern for numbness.   Reports having history of carpal tunnel surgery on the right side, and has had some intermittent symptoms to her left hand while sleeping in the past that improved after waking up and stretching, but now has had persistent numbness of her left hand over her thumb, index middle and ring fingers.  Reports she also began to develop pain in her hand.  In addition to the symptoms reports that she will at times drop things with her left hand.  Today, she developed a pain that moved from her hand up her arm.  She has not noticed any headache, increased neck pain, other areas of numbness, weakness, facial droop, visual changes difficulty walking or talking.  She denies any chest pain or shortness of breath.  The pain through her left arm felt sudden and shooting and resolved  Overall history and location seem consistent with likely carpal tunnel, however on exam she is noted to have weakness of her left wrist and weakness with shoulder abduction.  Given unclear timing of this weakness, she is not a candidate for a code stroke.  Suspect her symptoms are more likely related to a cervical radiculopathy given symptoms in shoulder, wrist, and suspect lesion of C5-C6, however with risk factors for intracranial etiology and concern for some pronator drift on exam will evaluate with MRI brain as well as MRI cervical spine to evaluate for possible CVA or intracranial abnormality and evaluate for spinal cord abnormalities.  Labs and ECG obtained show no  evidence of ACS or significant electrolyte abnormalities, low suspicion for dissection by hx and exam.. No significant electrolyte abnormalities as etiology of numbness.   MRI pending.    MRI shows cervical spondylosis C5-C6 level wth moderate canal stenosis and moderate to severe right and mod left foraminal stenosis.  Discussed with [facility] Dr Ronnald Ramp. GIven medrol dosepack and recommend outpatient follow up.   States today that she continues to have pain in her left hand and arm.  Reports that she is left-handed.  Reports that she has not been using a brace or using stretching for any relief, states that "it will not help".  States that she does have an appointment coming up in December to be evaluated by general surgery.  Reports that she is very frustrated because she feels that it is too long for her to wait.  Reports that she did take her blood pressure medication today, states that her blood pressure is elevated because she is in pain.  Denies hypertensive symptoms.     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, on left side of brain    Arthritis    "all over" (06/19/2017)   Diabetes mellitus without complication ([place])    diet control, pt denies   Hyperlipidemia    Hypertension    TIA (transient ischemic attack)    pt unaware of this hx on 06/19/2017    Past Surgical History:  Procedure Laterality Date  ANTERIOR CERVICAL DECOMP/DISCECTOMY FUSION     BACK SURGERY     CESAREAN SECTION  1986   FOOT SURGERY Left 11/2019   IR GENERIC HISTORICAL  09/21/2014   IR ANGIO INTRA EXTRACRAN SEL INTERNAL CAROTID UNI L MOD SED 09/21/2014 [name], [facility]   IR GENERIC HISTORICAL  09/21/2014   IR ANGIO INTRA EXTRACRAN SEL COM CAROTID INNOMINATE UNI R MOD SED 09/21/2014 [name], MD [facility]   IR GENERIC HISTORICAL  09/21/2014   IR ANGIO VERTEBRAL SEL VERTEBRAL UNI L MOD SED 09/21/2014 [name], [facility]   IR  GENERIC HISTORICAL  09/21/2014   IR 3D INDEPENDENT WKST 09/21/2014 [name], [facility]   LAPAROSCOPIC CHOLECYSTECTOMY     TUBAL LIGATION      Family History  Problem Relation Age of Onset   Hypertension Mother    Arthritis Mother    Diabetes Mother    Dementia Mother    Heart disease Father    Diabetes Sister    Diabetes Brother    Diabetes Sister    Cancer Neg Hx    Colon cancer Neg Hx    Colon polyps Neg Hx    Esophageal cancer Neg Hx    Rectal cancer Neg Hx    Stomach cancer Neg Hx     Social History   Socioeconomic History   Marital status: Widowed    Spouse name: Not on file   Number of children: 1   Years of education: Not on file   Highest education level: Some college, no degree  Occupational History   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 90 tablet 1   losartan-hydrochlorothiazide (HYZAAR) 100-25 MG tablet Take 1 tablet by mouth daily. 90 tablet 3   methylPREDNISolone (MEDROL DOSEPAK) 4 MG  TBPK tablet Take as directed 21 each 0   pantoprazole (PROTONIX) 40 MG tablet Take 1 tablet (40 mg total) by mouth daily. 90 tablet 3   tiZANidine (ZANAFLEX) 2 MG tablet Take 1 tablet (2 mg total) by mouth every 6 (six) hours as needed for muscle spasms. 60 tablet 1   vitamin B-12 (CYANOCOBALAMIN) 1000 MCG tablet Take 1 tablet (1,000 mcg total) by mouth daily. 30 tablet 0   No facility-administered medications prior to visit.    No Known Allergies  ROS Review of Systems  Constitutional: Negative.   HENT:  Negative.    Eyes: Negative.   Respiratory:  Negative for shortness of breath.   Cardiovascular:  Negative for chest pain.  Gastrointestinal: Negative.   Endocrine: Negative.   Genitourinary: Negative.   Musculoskeletal:  Positive for arthralgias and myalgias.  Skin: Negative.   Allergic/Immunologic: Negative.   Neurological: Negative.   Hematological: Negative.   Psychiatric/Behavioral: Negative.       Objective:    Physical Exam Vitals and nursing note reviewed.  Constitutional:      Appearance: Normal appearance.  HENT:     Head: Normocephalic and atraumatic.     Right Ear: External ear normal.     Left Ear: External ear normal.     Nose: Nose normal.     Mouth/Throat:     Mouth: Mucous membranes are moist.     Pharynx: Oropharynx is clear.  Eyes:     Extraocular Movements: Extraocular movements intact.     Conjunctiva/sclera: Conjunctivae normal.     Pupils: Pupils are equal, round, and reactive to light.  Cardiovascular:     Rate and Rhythm: Normal rate and regular rhythm.     Pulses: Normal pulses.     Heart sounds: Normal heart sounds.  Pulmonary:     Effort: Pulmonary effort is normal.     Breath sounds: Normal breath sounds.  Musculoskeletal:        General: Normal range of motion.     Cervical back: Normal range of motion and neck supple.  Skin:    General: Skin is warm and dry.  Neurological:     General: No focal deficit present.     Mental  Status: She is alert and oriented to person, place, and time.  Psychiatric:        Attention and Perception: Attention normal.        Mood and Affect: Affect is angry.        Speech: Speech normal.        Behavior: Behavior normal.        Thought Content: Thought content normal.        Cognition and Memory: Cognition normal.        Judgment: Judgment normal.    BP (!) 178/97 (BP Location: Right Arm, Patient Position: Sitting, Cuff Size: Normal)   Pulse 83   Temp 98.2 F (36.8 C) (Oral)   Resp 18   Ht '5\' 1"'  (1.549 m)   Wt 119 lb (54 kg)   SpO2 100%   BMI 22.48 kg/m  Wt Readings from Last 3 Encounters:  04/06/21 119 lb (54 kg)  02/03/21 120 lb 9.6 oz (54.7 kg)  01/04/21 119 lb (54 kg)     Health Maintenance Due  Topic Date Due   FOOT EXAM  Never done   Zoster Vaccines- Shingrix (1 of 2) Never done   OPHTHALMOLOGY EXAM  07/25/2018   COVID-19 Vaccine (3 - Booster for Pfizer series) 07/07/2020   INFLUENZA VACCINE  01/23/2021    There are no preventive care reminders to display for this patient.  Lab Results  Component Value Date   TSH 1.900 01/29/2020   Lab Results  Component Value Date   WBC 8.8 03/22/2021   HGB 12.1 03/22/2021   HCT 38.1 03/22/2021   MCV 86.8 03/22/2021   PLT 342 03/22/2021   Lab Results  Component Value Date   NA 140 03/22/2021   K 3.7 03/22/2021   CO2 24 03/22/2021   GLUCOSE 116 (H) 03/22/2021   BUN 9 03/22/2021  CREATININE 0.62 03/22/2021   BILITOT 0.3 03/22/2021   ALKPHOS 84 03/22/2021   AST 14 (L) 03/22/2021   ALT 8 03/22/2021   PROT 7.1 03/22/2021   ALBUMIN 3.8 03/22/2021   CALCIUM 9.3 03/22/2021   ANIONGAP 9 03/22/2021   EGFR 78 10/18/2020   Lab Results  Component Value Date   CHOL 199 05/27/2020   Lab Results  Component Value Date   HDL 73 05/27/2020   Lab Results  Component Value Date   LDLCALC 114 (H) 05/27/2020   Lab Results  Component Value Date   TRIG 68 05/27/2020   Lab Results  Component Value Date    CHOLHDL 2.7 05/27/2020   Lab Results  Component Value Date   HGBA1C 6.5 (H) 10/09/2020      Assessment & Plan:   Problem List Items Addressed This Visit   None  No orders of the defined types were placed in this encounter. 1. Numbness and tingling of left hand Strongly encourage patient to use bracing and stretching while she waits to be seen by general surgery to provide some form of relief.  Red flags given for prompt reevaluation.  2. Carpal tunnel syndrome of left wrist    I have reviewed the patient's medical history (PMH, PSH, Social History, Family History, Medications, and allergies) , and have been updated if relevant. I spent 20 minutes reviewing chart and  face to face time with patient.   Follow-up: Return if symptoms worsen or fail to improve.    Loraine Grip Mayers, PA-C

## 2021-04-06 NOTE — Patient Instructions (Signed)
I strongly encourage you to start wearing a brace when you are doing repetitive activities and also at night while you are sleeping.  I also strongly encourage you to start doing stretching exercises.  While these may not completely solve your problem they may offer some relief.  I strongly encourage you to see if you are able to be seen sooner for your consultation by the surgeon.  Kennieth Rad, PA-C Physician Assistant [facility] [email or web address]  Carpal Tunnel Syndrome Carpal tunnel syndrome is a condition that causes pain, numbness, and weakness in your hand and fingers. The carpal tunnel is a narrow area located on the palm side of your wrist. Repeated wrist motion or certain diseases may cause swelling within the tunnel. This swelling pinches the main nerve in the wrist. The main nerve in the wrist is called the median nerve. What are the causes? This condition may be caused by: Repeated and forceful wrist and hand motions. Wrist injuries. Arthritis. A cyst or tumor in the carpal tunnel. Fluid buildup during pregnancy. Use of tools that vibrate. Sometimes the cause of this condition is not known. What increases the risk? The following factors may make you more likely to develop this condition: Having a job that requires you to repeatedly or forcefully move your wrist or hand or requires you to use tools that vibrate. This may include jobs that involve using computers, working on an [employer], or working with [employer] such as Pension scheme manager. Being a woman. Having certain conditions, such as: Diabetes. Obesity. An underactive thyroid (hypothyroidism). Kidney failure. Rheumatoid arthritis. What are the signs or symptoms? Symptoms of this condition include: A tingling feeling in your fingers, especially in your thumb, index, and middle fingers. Tingling or numbness in your hand. An aching feeling in your entire  arm, especially when your wrist and elbow are bent for a long time. Wrist pain that goes up your arm to your shoulder. Pain that goes down into your palm or fingers. A weak feeling in your hands. You may have trouble grabbing and holding items. Your symptoms may feel worse during the night. How is this diagnosed? This condition is diagnosed with a medical history and physical exam. You may also have tests, including: Electromyogram (EMG). This test measures electrical signals sent by your nerves into the muscles. Nerve conduction study. This test measures how well electrical signals pass through your nerves. Imaging tests, such as X-rays, ultrasound, and MRI. These tests check for possible causes of your condition. How is this treated? This condition may be treated with: Lifestyle changes. It is important to stop or change the activity that caused your condition. Doing exercise and activities to strengthen and stretch your muscles and tendons (physical therapy). Making lifestyle changes to help with your condition and learning how to do your daily activities safely (occupational therapy). Medicines for pain and inflammation. This may include medicine that is injected into your wrist. A wrist splint or brace. Surgery. Follow these instructions at home: If you have a splint or brace: Wear the splint or brace as told by your health care provider. Remove it only as told by your health care provider. Loosen the splint or brace if your fingers tingle, become numb, or turn cold and blue. Keep the splint or brace clean. If the splint or brace is not waterproof: Do not let it get wet. Cover it with a watertight covering when you take a bath or shower. Managing pain, stiffness, and swelling  If directed, put ice on the painful area. To do this: If you have a removeable splint or brace, remove it as told by your health care provider. Put ice in a plastic bag. Place a towel between your skin and the  bag or between the splint or brace and the bag. Leave the ice on for 20 minutes, 2-3 times a day. Do not fall asleep with the cold pack on your skin. Remove the ice if your skin turns bright red. This is very important. If you cannot feel pain, heat, or cold, you have a greater risk of damage to the area. Move your fingers often to reduce stiffness and swelling. General instructions Take over-the-counter and prescription medicines only as told by your health care provider. Rest your wrist and hand from any activity that may be causing your pain. If your condition is work related, talk with your employer about changes that can be made, such as getting a wrist pad to use while typing. Do any exercises as told by your health care provider, physical therapist, or occupational therapist. Keep all follow-up visits. This is important. Contact a health care provider if: You have new symptoms. Your pain is not controlled with medicines. Your symptoms get worse. Get help right away if: You have severe numbness or tingling in your wrist or hand. Summary Carpal tunnel syndrome is a condition that causes pain, numbness, and weakness in your hand and fingers. It is usually caused by repeated wrist motions. Lifestyle changes and medicines are used to treat carpal tunnel syndrome. Surgery may be recommended. Follow your health care provider's instructions about wearing a splint, resting from activity, keeping follow-up visits, and calling for help. This information is not intended to replace advice given to you by your health care provider. Make sure you discuss any questions you have with your health care provider. Document Revised: 10/22/2019 Document Reviewed: 10/22/2019 Elsevier Patient Education  [place].

## 2021-04-06 NOTE — Progress Notes (Signed)
Patient has eaten and taken medication. Patient reports nagging pain in the left arm described as numbness. Patient has consult with surgeon in December and states she shouldn't have to wait until then.

## 2021-04-10 ENCOUNTER — Ambulatory Visit: Payer: Medicare Other | Admitting: Neurology

## 2021-04-10 NOTE — Progress Notes (Deleted)
[name] - Initial Visit   Date: 04/10/21  [name] MRN: [id number] DOB: 11/10/1955   Dear Dr Oletta Lamas, [place] Cage, NP:  Thank you for your kind referral of [name] for consultation of ***. Although her history is well known to you, please allow [name] to reiterate it for the purpose of our medical record. The patient was accompanied to the clinic by *** who also provides collateral information.     History of Present Illness: [name] is a 65 y.o. ***-handed female with *** presenting for evaluation of ***.    Out-side paper records, electronic medical record, and images have been reviewed where available and summarized as: *** MRI cervical spine 03/22/2021: 1. Multilevel cervical spondylosis, most pronounced at the C5-6 level where there is moderate canal stenosis with moderate to severe right and moderate left foraminal stenosis. 2. Mild canal stenosis and mild bilateral foraminal stenosis at C4-5. 3. At least moderate right foraminal stenosis at T1-T2. 4. 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 No evidence of residual foraminal or canal stenosis at this level.  MRI brain wo contrast 03/22/2021: 1. No acute intracranial pathology. 2. Moderate chronic white matter microangiopathy.  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, on left side of brain    Arthritis    "all over" (06/19/2017)   Diabetes mellitus without complication ([place])    diet control, pt denies   Hyperlipidemia    Hypertension    TIA (transient ischemic attack)    pt unaware of this hx on 06/19/2017    Past Surgical History:   Procedure Laterality Date   ANTERIOR CERVICAL DECOMP/DISCECTOMY FUSION     BACK SURGERY     CESAREAN SECTION  1986   FOOT SURGERY Left 11/2019   IR GENERIC HISTORICAL  09/21/2014   IR ANGIO INTRA EXTRACRAN SEL INTERNAL CAROTID UNI L MOD SED 09/21/2014 [name], MD [facility]   IR GENERIC HISTORICAL  09/21/2014   IR ANGIO INTRA EXTRACRAN SEL COM CAROTID INNOMINATE UNI R MOD SED 09/21/2014 [name], MD [facility]   IR GENERIC HISTORICAL  09/21/2014   IR ANGIO VERTEBRAL SEL VERTEBRAL UNI L MOD SED 09/21/2014 [name], MD [facility]   IR GENERIC HISTORICAL  09/21/2014   IR 3D INDEPENDENT WKST 09/21/2014 [name], MD [facility]   LAPAROSCOPIC CHOLECYSTECTOMY     TUBAL LIGATION       Medications:  Outpatient Encounter Medications as of 04/10/2021  Medication Sig   amLODipine (NORVASC) 10 MG tablet Take 1 tablet (10 mg total) by mouth daily.   losartan-hydrochlorothiazide (HYZAAR) 100-25 MG tablet Take 1 tablet by mouth daily.   methylPREDNISolone (MEDROL DOSEPAK) 4 MG TBPK tablet Take as directed   pantoprazole (PROTONIX) 40 MG tablet Take 1 tablet (40 mg total) by mouth daily.   tiZANidine (ZANAFLEX) 2 MG tablet Take 1 tablet (2 mg total) by mouth every 6 (six) hours as needed for muscle spasms.   vitamin B-12 (CYANOCOBALAMIN) 1000 MCG tablet Take 1 tablet (1,000 mcg total) by mouth daily.   No facility-administered encounter medications on  file as of 04/10/2021.    Allergies: No Known Allergies  Family History: Family History  Problem Relation Age of Onset   Hypertension Mother    Arthritis Mother    Diabetes Mother    Dementia Mother    Heart disease Father    Diabetes Sister    Diabetes Brother    Diabetes Sister    Cancer Neg Hx    Colon cancer Neg Hx    Colon polyps Neg Hx    Esophageal cancer Neg Hx    Rectal cancer Neg Hx    Stomach cancer Neg Hx     Social History: Social History   Tobacco Use   Smoking status: Never    Smokeless tobacco: Never  Vaping Use   Vaping Use: Never used  Substance Use Topics   Alcohol use: Yes    Alcohol/week: 3.0 standard drinks    Types: 3 Standard drinks or equivalent per week    Comment: socially    Drug use: Not Currently   Social History   Social History Narrative   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   General Medical Exam:  *** General:  Well appearing, comfortable.   Eyes/ENT: see cranial nerve examination.   Neck:   No carotid bruits. Respiratory:  Clear to auscultation, good air entry bilaterally.   Cardiac:  Regular rate and rhythm, no murmur.   Extremities:  No deformities, edema, or skin discoloration.  Skin:  No rashes or lesions.  Neurological Exam: MENTAL STATUS including orientation to time, place, person, recent and remote memory, attention span and concentration, language, and fund of knowledge is ***normal.  Speech is not dysarthric.  CRANIAL NERVES: II:  No visual field defects.  Unremarkable fundi.   III-IV-VI: Pupils equal round and reactive to light.  Normal conjugate, extra-ocular eye movements in all directions of gaze.  No nystagmus.  No ptosis***.   V:  Normal facial sensation.    VII:  Normal facial symmetry and movements.   VIII:  Normal hearing and vestibular function.   IX-X:  Normal palatal movement.   XI:  Normal shoulder shrug and head rotation.   XII:  Normal tongue strength and range of motion, no deviation or fasciculation.  MOTOR:  No atrophy, fasciculations or abnormal movements.  No pronator drift.   Upper Extremity:  Right  Left  Deltoid  5/5   5/5   Biceps  5/5   5/5   Triceps  5/5   5/5   Infraspinatus 5/5  5/5  Medial pectoralis 5/5  5/5  Wrist extensors  5/5   5/5   Wrist flexors  5/5   5/5   Finger extensors  5/5   5/5   Finger flexors  5/5   5/5    Dorsal interossei  5/5   5/5   Abductor pollicis  5/5   5/5   Tone (Ashworth scale)  0  0   Lower Extremity:  Right  Left  Hip flexors  5/5   5/5   Hip extensors  5/5   5/5   Adductor 5/5  5/5  Abductor 5/5  5/5  Knee flexors  5/5   5/5   Knee extensors  5/5   5/5   Dorsiflexors  5/5   5/5   Plantarflexors  5/5  5/5   Toe extensors  5/5   5/5   Toe flexors  5/5   5/5   Tone (Ashworth scale)  0  0   MSRs:  Right        Left                  brachioradialis 2+  2+  biceps 2+  2+  triceps 2+  2+  patellar 2+  2+  ankle jerk 2+  2+  Hoffman no  no  plantar response down  down   SENSORY:  Normal and symmetric perception of light touch, pinprick, vibration, and proprioception.  Romberg's sign absent.   COORDINATION/GAIT: Normal finger-to- nose-finger and heel-to-shin.  Intact rapid alternating movements bilaterally.  Able to rise from a chair without using arms.  Gait narrow based and stable. Tandem and stressed gait intact.    IMPRESSION: ***  PLAN/RECOMMENDATIONS:  *** Return to clinic in *** months.  Total time spent: ***   Thank you for allowing me to participate in patient's care.  If I can answer any additional questions, I would be pleased to do so.    Sincerely,    Kosta Schnitzler K. Posey Pronto, DO

## 2021-04-13 ENCOUNTER — Other Ambulatory Visit (INDEPENDENT_AMBULATORY_CARE_PROVIDER_SITE_OTHER): Payer: Self-pay | Admitting: Primary Care

## 2021-04-13 ENCOUNTER — Telehealth (INDEPENDENT_AMBULATORY_CARE_PROVIDER_SITE_OTHER): Payer: Self-pay

## 2021-04-13 DIAGNOSIS — M25532 Pain in left wrist: Secondary | ICD-10-CM

## 2021-04-13 DIAGNOSIS — M25531 Pain in right wrist: Secondary | ICD-10-CM

## 2021-04-13 MED ORDER — CELECOXIB 100 MG PO CAPS
100.0000 mg | ORAL_CAPSULE | Freq: Two times a day (BID) | ORAL | 1 refills | Status: DC
  Filled 2021-04-13 – 2021-04-28 (×2): qty 60, 30d supply, fill #0

## 2021-04-13 NOTE — Telephone Encounter (Signed)
Patient came into the clinic wanting to know if it was okay for her to take her ibuprofen (ADVIL) 600 MG tablet .   Patient states she was advised to discount this rx when her reflux start to delevope. Patient states she is still having a lot of pain in her left arm and is bot able to sleep at night.  Please advise patient [phone]

## 2021-04-13 NOTE — Telephone Encounter (Signed)
Called Patient to informed tylenol 500mg  (2) not working. Has numbness from her neck to her fingers . Sending in celebrex 100mg  bid

## 2021-04-14 ENCOUNTER — Other Ambulatory Visit: Payer: Self-pay

## 2021-04-14 ENCOUNTER — Other Ambulatory Visit (HOSPITAL_COMMUNITY): Payer: Self-pay

## 2021-04-21 ENCOUNTER — Other Ambulatory Visit: Payer: Self-pay

## 2021-04-28 ENCOUNTER — Other Ambulatory Visit: Payer: Self-pay

## 2021-05-04 ENCOUNTER — Other Ambulatory Visit: Payer: Self-pay

## 2021-05-15 ENCOUNTER — Encounter (INDEPENDENT_AMBULATORY_CARE_PROVIDER_SITE_OTHER): Payer: Self-pay | Admitting: Primary Care

## 2021-05-15 ENCOUNTER — Other Ambulatory Visit: Payer: Self-pay

## 2021-05-15 ENCOUNTER — Ambulatory Visit (INDEPENDENT_AMBULATORY_CARE_PROVIDER_SITE_OTHER): Payer: Medicare Other | Admitting: Primary Care

## 2021-05-15 VITALS — BP 171/102 | HR 74 | Temp 97.5°F | Ht 61.0 in | Wt 118.0 lb

## 2021-05-15 DIAGNOSIS — M25532 Pain in left wrist: Secondary | ICD-10-CM

## 2021-05-15 DIAGNOSIS — M25531 Pain in right wrist: Secondary | ICD-10-CM | POA: Diagnosis not present

## 2021-05-15 DIAGNOSIS — E119 Type 2 diabetes mellitus without complications: Secondary | ICD-10-CM

## 2021-05-15 DIAGNOSIS — I10 Essential (primary) hypertension: Secondary | ICD-10-CM

## 2021-05-15 DIAGNOSIS — Z760 Encounter for issue of repeat prescription: Secondary | ICD-10-CM

## 2021-05-15 DIAGNOSIS — Z23 Encounter for immunization: Secondary | ICD-10-CM | POA: Diagnosis not present

## 2021-05-15 LAB — POCT GLYCOSYLATED HEMOGLOBIN (HGB A1C): Hemoglobin A1C: 6.8 % — AB (ref 4.0–5.6)

## 2021-05-15 MED ORDER — AMLODIPINE BESYLATE 10 MG PO TABS
10.0000 mg | ORAL_TABLET | Freq: Every day | ORAL | 1 refills | Status: DC
  Filled 2021-05-15 – 2021-11-02 (×4): qty 90, 90d supply, fill #0
  Filled 2022-01-17 – 2022-03-07 (×3): qty 90, 90d supply, fill #1

## 2021-05-15 MED ORDER — CELECOXIB 100 MG PO CAPS
100.0000 mg | ORAL_CAPSULE | Freq: Two times a day (BID) | ORAL | 1 refills | Status: DC
  Filled 2021-05-15: qty 60, 30d supply, fill #0

## 2021-05-15 MED ORDER — LOSARTAN POTASSIUM-HCTZ 100-25 MG PO TABS
1.0000 | ORAL_TABLET | Freq: Every day | ORAL | 3 refills | Status: DC
Start: 2021-05-15 — End: 2022-07-17
  Filled 2021-05-15 – 2021-07-10 (×4): qty 90, 90d supply, fill #0
  Filled 2021-10-16 – 2021-11-02 (×2): qty 90, 90d supply, fill #1
  Filled 2022-01-17 – 2022-03-07 (×3): qty 90, 90d supply, fill #2

## 2021-05-15 NOTE — Progress Notes (Signed)
[place], is a 65 y.o. female  [id number]:[id number]  [id number]  DOB - 1955-10-12  Chief Complaint  Patient presents with   Blood Pressure Check       Subjective:   [name] is a 65 y.o. female here today for a follow up visit. Patient has No headache, No chest pain, No abdominal pain - No Nausea, No new weakness tingling or numbness, No Cough - SOB. Type 2 diabetes mellitus.  Current symptoms/problems include numbness left hand numbness and have been worsening. Symptoms have been present for 3 months. Current diabetic medications include none.  Hypertension management she just got off of work and came straight to her appt. No medication. 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 Authorizing Provider  amLODipine (NORVASC) 10 MG tablet Take 1 tablet (10 mg total) by mouth daily. 11/11/20  Yes [name], NP  celecoxib (CELEBREX) 100 MG capsule Take 1 capsule (100 mg total) by mouth 2 (two) times daily. 04/13/21  Yes [name], NP  losartan-hydrochlorothiazide (HYZAAR) 100-25 MG tablet Take 1 tablet by mouth daily. 11/11/20  Yes [name], NP  pantoprazole (PROTONIX) 40 MG tablet Take 1 tablet (40 mg total) by mouth daily. 10/27/20  Yes Ladene Artist, MD  tiZANidine (ZANAFLEX) 2 MG tablet Take 1 tablet (2 mg total) by mouth every 6 (six) hours as needed for muscle spasms. Patient not taking:  Reported on 05/15/2021 11/11/20   [name], NP    Objective:   Vitals:   05/15/21 0832 05/15/21 0843  BP: (!) 168/115 (!) 171/102  Pulse: 75 74  Temp: (!) 97.5 F (36.4 C)   TempSrc: Temporal   SpO2: 98%   Weight: 118 lb (53.5 kg)   Height: 5\' 1"  (1.549 m)    Exam General appearance : Awake, alert, not in any distress. Speech Clear. Not toxic looking HEENT: Atraumatic and Normocephalic, pupils equally reactive to light and accomodation Neck: Supple, no JVD. No cervical lymphadenopathy.  Chest: Good air entry bilaterally, no added sounds  CVS: S1 S2 regular, no murmurs.  Abdomen: Bowel sounds present, Non tender and not distended with no gaurding, rigidity or rebound. Extremities: B/L Lower Ext shows no edema, both legs are warm to touch Neurology: Awake alert, and oriented X 3, CN II-XII intact, Non focal Skin: No Rash  Data Review Lab Results  Component Value Date   HGBA1C 6.8 (A) 05/15/2021   HGBA1C 6.5 (H) 10/09/2020   HGBA1C 5.8 (A) 09/07/2019    Assessment & Plan   [name] was seen today for blood pressure check. Diagnoses and all orders for this visit:  1. Type 2 diabetes mellitus without complication, without long-term current use of insulin (HCC) - HgB A1c 6.1 down from 6.5 A1C  Continue Monitor Foods that are high in carbohydrates are the following rice, potatoes, breads,  sugars, and pastas.  Reduction in the intake (eating) will assist in lowering your blood sugars. Foster a dog that she has to walk frequently to potty.  [name] was seen today for blood pressure check.  Essential hypertension Counseled on blood pressure goal of less than 130/80, low-sodium, DASH diet, medication compliance, 150 minutes of moderate intensity exercise per week. Discussed medication compliance, adverse effects.   Bilateral wrist pain Awaiting orthopedics appt -     celecoxib (CELEBREX) 100 MG capsule; Take 1 capsule (100 mg total) by mouth 2 (two) times  daily.  Medication refill -     celecoxib (CELEBREX) 100 MG capsule; Take 1 capsule (100 mg total) by mouth 2 (two) times daily. -     amLODipine (NORVASC) 10 MG tablet; Take 1 tablet (10 mg total) by mouth daily. -     losartan-hydrochlorothiazide (HYZAAR) 100-25 MG tablet; Take 1 tablet by mouth daily.   Need for immunization against influenza completed  Patient have been counseled extensively about nutrition and exercise. Other issues discussed during this visit include: low cholesterol diet, weight control and daily exercise, foot care, annual eye examinations at Ophthalmology, importance of adherence with medications and regular follow-up. We also discussed long term complications of uncontrolled diabetes and hypertension.   Return in about 7 weeks (around 07/03/2021) for Bp/Fasting ? shingles.  The patient was given clear instructions to go to ER or return to medical center if symptoms don't improve, worsen or new problems develop. The patient verbalized understanding. The patient was told to call to get lab results if they haven't heard anything in the next week.   This note has been created with Surveyor, quantity. Any transcriptional errors are unintentional.    [name],  05/15/2021, 9:26 AM

## 2021-05-15 NOTE — Patient Instructions (Addendum)
Influenza, Adult Influenza is also called "the flu." It is an infection in the lungs, nose, and throat (respiratory tract). It spreads easily from person to person (is contagious). The flu causes symptoms that are like a cold, along with high fever and body aches. What are the causes? This condition is caused by the influenza virus. You can get the virus by: Breathing in droplets that are in the air after a person infected with the flu coughed or sneezed. Touching something that has the virus on it and then touching your mouth, nose, or eyes. What increases the risk? Certain things may make you more likely to get the flu. These include: Not washing your hands often. Having close contact with many people during cold and flu season. Touching your mouth, eyes, or nose without first washing your hands. Not getting a flu shot every year. You may have a higher risk for the flu, and serious problems, such as a lung infection (pneumonia), if you: Are older than 65. Are pregnant. Have a weakened disease-fighting system (immune system) because of a disease or because you are taking certain medicines. Have a long-term (chronic) condition, such as: Heart, kidney, or lung disease. Diabetes. Asthma. Have a liver disorder. Are very overweight (morbidly obese). Have anemia. What are the signs or symptoms? Symptoms usually begin suddenly and last 4-14 days. They may include: Fever and chills. Headaches, body aches, or muscle aches. Sore throat. Cough. Runny or stuffy (congested) nose. Feeling discomfort in your chest. Not wanting to eat as much as normal. Feeling weak or tired. Feeling dizzy. Feeling sick to your stomach or throwing up. How is this treated? If the flu is found early, you can be treated with antiviral medicine. This can help to reduce how bad the illness is and how long it lasts. This may be given by mouth or through an IV tube. Taking care of yourself at home can help your  symptoms get better. Your doctor may want you to: Take over-the-counter medicines. Drink plenty of fluids. The flu often goes away on its own. If you have very bad symptoms or other problems, you may be treated in a hospital. Follow these instructions at home:   Activity Rest as needed. Get plenty of sleep. Stay home from work or school as told by your doctor. Do not leave home until you do not have a fever for 24 hours without taking medicine. Leave home only to go to your doctor. Eating and drinking Take an ORS (oral rehydration solution). This is a drink that is sold at pharmacies and stores. Drink enough fluid to keep your pee pale yellow. Drink clear fluids in small amounts as you are able. Clear fluids include: Water. Ice chips. Fruit juice mixed with water. Low-calorie sports drinks. Eat bland foods that are easy to digest. Eat small amounts as you are able. These foods include: Bananas. Applesauce. Rice. Lean meats. Toast. Crackers. Do not eat or drink: Fluids that have a lot of sugar or caffeine. Alcohol. Spicy or fatty foods. General instructions Take over-the-counter and prescription medicines only as told by your doctor. Use a cool mist humidifier to add moisture to the air in your home. This can make it easier for you to breathe. When using a cool mist humidifier, clean it daily. Empty water and replace with clean water. Cover your mouth and nose when you cough or sneeze. Wash your hands with soap and water often and for at least 20 seconds. This is also important after  you cough or sneeze. If you cannot use soap and water, use alcohol-based hand sanitizer. Keep all follow-up visits. How is this prevented?  Get a flu shot every year. You may get the flu shot in late summer, fall, or winter. Ask your doctor when you should get your flu shot. Avoid contact with people who are sick during fall and winter. This is cold and flu season. Contact a doctor if: You get  new symptoms. You have: Chest pain. Watery poop (diarrhea). A fever. Your cough gets worse. You start to have more mucus. You feel sick to your stomach. You throw up. Get help right away if you: Have shortness of breath. Have trouble breathing. Have skin or nails that turn a bluish color. Have very bad pain or stiffness in your neck. Get a sudden headache. Get sudden pain in your face or ear. Cannot eat or drink without throwing up. These symptoms may represent a serious problem that is an emergency. Get medical help right away. Call your local emergency services (911 in the [place] Do not wait to see if the symptoms will go away. Do not drive yourself to the hospital. Summary Influenza is also called "the flu." It is an infection in the lungs, nose, and throat. It spreads easily from person to person. Take over-the-counter and prescription medicines only as told by your doctor. Getting a flu shot every year is the best way to not get the flu. This information is not intended to replace advice given to you by your health care provider. Make sure you discuss any questions you have with your health care provider. Document Revised: 01/29/2020 Document Reviewed: 01/29/2020 Elsevier Patient Education  2022 [place], which is also known as herpes zoster, is an infection that causes a painful skin rash and fluid-filled blisters. It is caused by a virus. Shingles only develops in people who: Have had chickenpox. Have been vaccinated against chickenpox. Shingles is rare in this group. What are the causes? Shingles is caused by varicella-zoster virus. This is the same virus that causes chickenpox. After a person is exposed to the virus, it stays in the body in an inactive (dormant) state. Shingles develops if the virus is reactivated. This can happen many years after the first (initial) exposure to the virus. It is not known what causes this virus to be  reactivated. What increases the risk? People who have had chickenpox or received the chickenpox vaccine are at risk for shingles. Shingles infection is more common in people who: Are older than 66 years of age. Have a weakened disease-fighting system (immune system), such as people with: HIV (human immunodeficiency virus). AIDS (acquired immunodeficiency syndrome). Cancer. Are taking medicines that weaken the immune system, such as organ transplant medicines. Are experiencing a lot of stress. What are the signs or symptoms? Early symptoms of this condition include itching, tingling, and pain in an area on your skin. Pain may be described as burning, stabbing, or throbbing. A few days or weeks after early symptoms start, a painful red rash appears. The rash is usually on one side of the body and has a band-like or belt-like pattern. The rash eventually turns into fluid-filled blisters that break open, change into scabs, and dry up in about 2-3 weeks. At any time during the infection, you may also develop: A fever. Chills. A headache. Nausea. How is this diagnosed? This condition is diagnosed with a skin exam. Skin or fluid samples (a culture) may be taken from the  blisters before a diagnosis is made. How is this treated? The rash may last for several weeks. There is not a specific cure for this condition. Your health care provider may prescribe medicines to help you manage pain, recover more quickly, and avoid long-term problems. Medicines may include: Antiviral medicines. Anti-inflammatory medicines. Pain medicines. Anti-itching medicines (antihistamines). If the area involved is on your face, you may be referred to a specialist, such as an eye doctor (ophthalmologist) or an ear, nose, and throat (ENT) doctor (otorhinolaryngologist) to help you avoid eye problems, chronic pain, or disability. Follow these instructions at home: Medicines Take over-the-counter and prescription medicines  only as told by your health care provider. Apply an anti-itch cream or numbing cream to the affected area as told by your health care provider. Relieving itching and discomfort  Apply cold, wet cloths (cold compresses) to the area of the rash or blisters as told by your health care provider. Cool baths can be soothing. Try adding baking soda or dry oatmeal to the water to reduce itching. Do not bathe in hot water. Use calamine lotion as recommended by your health care provider. This is an over-the-counter lotion that helps to relieve itchiness. Blister and rash care Keep your rash covered with a loose bandage (dressing). Wear loose-fitting clothing to help ease the pain of material rubbing against the rash. Wash your hands with soap and water for at least 20 seconds before and after you change your dressing. If soap and water are not available, use hand sanitizer. Change your dressing as told by your health care provider. Keep your rash and blisters clean by washing the area with mild soap and cool water as told by your health care provider. Check your rash every day for signs of infection. Check for: More redness, swelling, or pain. Fluid or blood. Warmth. Pus or a bad smell. Do not scratch your rash or pick at your blisters. To help avoid scratching: Keep your fingernails clean and cut short. Wear gloves or mittens while you sleep, if scratching is a problem. General instructions Rest as told by your health care provider. Wash your hands often with soap and water for at least 20 seconds. If soap and water are not available, use hand sanitizer. Doing this lowers your chance of getting a bacterial skin infection. Before your blisters change into scabs, your shingles infection can cause chickenpox in people who have never had it or have never been vaccinated against it. To prevent this from happening, avoid contact with other people, especially: Babies. Pregnant women. Children who have  eczema. Older people who have transplants. People who have chronic illnesses, such as cancer or AIDS. Keep all follow-up visits. This is important. How is this prevented? Getting vaccinated is the best way to prevent shingles and protect against shingles complications. If you have not been vaccinated, talk with your health care provider about getting the vaccine. Where to find more information Centers for Disease Control and Prevention: http://www.wolf.info/ Contact a health care provider if: Your pain is not relieved with prescribed medicines. Your pain does not get better after the rash heals. You have any of these signs of infection: More redness, swelling, or pain around the rash. Fluid or blood coming from the rash. Warmth coming from your rash. Pus or a bad smell coming from the rash. A fever. Get help right away if: The rash is on your face or nose. You have facial pain, pain around your eye area, or loss of feeling on one  side of your face. You have difficulty seeing. You have ear pain or have ringing in your ear. You have a loss of taste. Your condition gets worse. Summary Shingles, also known as herpes zoster, is an infection that causes a painful skin rash and fluid-filled blisters. This condition is diagnosed with a skin exam. Skin or fluid samples (a culture) may be taken from the blisters. Keep your rash covered with a loose bandage (dressing). Wear loose-fitting clothing to help ease the pain of material rubbing against the rash. Before your blisters change into scabs, your shingles infection can cause chickenpox in people who have never had it or have never been vaccinated against it. This information is not intended to replace advice given to you by your health care provider. Make sure you discuss any questions you have with your health care provider. Document Revised: 06/06/2020 Document Reviewed: 06/06/2020 Elsevier Patient Education  2022 [employer].

## 2021-05-22 ENCOUNTER — Other Ambulatory Visit: Payer: Self-pay

## 2021-06-09 ENCOUNTER — Ambulatory Visit: Payer: Medicare Other | Admitting: Neurology

## 2021-06-14 ENCOUNTER — Institutional Professional Consult (permissible substitution): Payer: Medicare HMO | Admitting: Diagnostic Neuroimaging

## 2021-06-16 ENCOUNTER — Ambulatory Visit: Payer: Medicare Other | Admitting: Neurology

## 2021-07-01 ENCOUNTER — Emergency Department (HOSPITAL_COMMUNITY): Payer: Medicare Other

## 2021-07-01 ENCOUNTER — Other Ambulatory Visit: Payer: Self-pay

## 2021-07-01 ENCOUNTER — Emergency Department (HOSPITAL_COMMUNITY)
Admission: EM | Admit: 2021-07-01 | Discharge: 2021-07-01 | Disposition: A | Discharge status: Home / Self Care | Payer: Medicare Other | Attending: Emergency Medicine | Admitting: Emergency Medicine

## 2021-07-01 DIAGNOSIS — S92424A Nondisplaced fracture of distal phalanx of right great toe, initial encounter for closed fracture: Secondary | ICD-10-CM | POA: Insufficient documentation

## 2021-07-01 DIAGNOSIS — S99921A Unspecified injury of right foot, initial encounter: Secondary | ICD-10-CM | POA: Diagnosis present

## 2021-07-01 DIAGNOSIS — E119 Type 2 diabetes mellitus without complications: Secondary | ICD-10-CM | POA: Insufficient documentation

## 2021-07-01 DIAGNOSIS — Z79899 Other long term (current) drug therapy: Secondary | ICD-10-CM | POA: Insufficient documentation

## 2021-07-01 DIAGNOSIS — I10 Essential (primary) hypertension: Secondary | ICD-10-CM | POA: Diagnosis not present

## 2021-07-01 DIAGNOSIS — Y9367 Activity, basketball: Secondary | ICD-10-CM | POA: Diagnosis not present

## 2021-07-01 DIAGNOSIS — W01198A Fall on same level from slipping, tripping and stumbling with subsequent striking against other object, initial encounter: Secondary | ICD-10-CM | POA: Insufficient documentation

## 2021-07-01 MED ORDER — HYDROCODONE-ACETAMINOPHEN 5-325 MG PO TABS
1.0000 | ORAL_TABLET | ORAL | 0 refills | Status: DC | PRN
  Filled 2021-07-01: qty 10, 2d supply, fill #0

## 2021-07-01 MED ORDER — HYDROCODONE-ACETAMINOPHEN 5-325 MG PO TABS
1.0000 | ORAL_TABLET | Freq: Once | ORAL | Status: AC
  Administered 2021-07-01: 1 via ORAL
  Filled 2021-07-01: qty 1

## 2021-07-01 NOTE — ED Triage Notes (Signed)
Pt reports she fell today around 1300 and now her right foot is hurting. Pt reports pain while walking.

## 2021-07-01 NOTE — ED Provider Triage Note (Signed)
Emergency Medicine Provider Triage Evaluation Note  [name] , a 66 y.o. female  was evaluated in triage.  Pt complains of right foot pain after an injury at her nephew's basketball game.  Patient stumbled on the bleachers and fell onto her right foot.  Complaining of inflammation and severe pain that was not resolved with Tylenol  Review of Systems  Positive: Pain and inflammation Negative: Numbness or tingling  Physical Exam  BP (!) 166/89 (BP Location: Left Arm)    Pulse 85    Temp 99 F (37.2 C) (Oral)    Resp 18    Ht 5\' 2"  (1.575 m)    Wt 52.6 kg    SpO2 100%    BMI 21.22 kg/m  Gen:   Awake, no distress   Resp:  Normal effort  MSK:   Moves extremities without difficulty  Other:  Large amounts of swelling over first and second metatarsals of the right foot.  DP pulses strong, patient with full range of motion of ankle and MCPs.  Medical Decision Making  Medically screening exam initiated at 7:30 PM.  Appropriate orders placed.  [name] was informed that the remainder of the evaluation will be completed by another provider, this initial triage assessment does not replace that evaluation, and the importance of remaining in the ED until their evaluation is complete.  Likely rapid discharge after x-ray   Marcia Hammock, PA-C 07/01/21 1931

## 2021-07-01 NOTE — Discharge Instructions (Addendum)
We discussed the results of your x-ray on today's visit, you were provided with a copy for your records.  I have prescribed strong pain medication in order to help with your symptoms.  Please take 1 tablet every 4 hours as needed for pain.  Please be aware this medication can make you drowsy do not drink alcohol or drive while taking this medication.  You will need to schedule an appointment with your foot and ankle surgeon at your earliest convenience.

## 2021-07-01 NOTE — ED Provider Notes (Signed)
[facility] EMERGENCY DEPARTMENT Provider Note   CSN: [id number] Arrival date & time: 07/01/21  1857     History  Chief Complaint  Patient presents with   Foot Pain    [name] is a 66 y.o. female.  66 year old female with a past medical history of HTN, DM, arthritis presents to the ED status post mechanical fall.  Patient reports she was at her grandson's basketball game, when she suddenly fell along the bleachers.  She reports she struck her right foot onto the bleachers.  She attempted to ambulate but had severe pain to her right great toe.  She reports going home, taking some Tylenol but this did not help with the pain.  The pain is exacerbated with any weightbearing along with plantarflexion.  There is significant swelling noted to the area.  She denies any other injury.  No other complaints, did not strike her head.  The history is provided by the patient and medical records.  Foot Pain This is a new problem. Pertinent negatives include no headaches.      Home Medications Prior to Admission medications   Medication Sig Start Date End Date Taking? Authorizing Provider  amLODipine (NORVASC) 10 MG tablet Take 1 tablet (10 mg total) by mouth daily. 05/15/21   [name], NP  celecoxib (CELEBREX) 100 MG capsule Take 1 capsule (100 mg total) by mouth 2 (two) times daily. 05/15/21   [name], NP  HYDROcodone-acetaminophen (NORCO/VICODIN) 5-325 MG tablet Take 1 tablet by mouth every 4 (four) hours as needed for up to 3 days. 07/01/21 07/04/21 Yes [place], PA-C  losartan-hydrochlorothiazide (HYZAAR) 100-25 MG tablet Take 1 tablet by mouth daily. 05/15/21   [name], NP  pantoprazole (PROTONIX) 40 MG tablet Take 1 tablet (40 mg total) by mouth daily. 10/27/20   Ladene Artist, MD  tiZANidine (ZANAFLEX) 2 MG tablet Take 1 tablet (2 mg total) by mouth every 6 (six) hours as needed for muscle spasms. Patient not taking: Reported on  05/15/2021 11/11/20   [name], NP      Allergies    Patient has no known allergies.    Review of Systems   Review of Systems  Constitutional:  Negative for fever.  Musculoskeletal:  Positive for arthralgias.  Neurological:  Negative for dizziness and headaches.   Physical Exam Updated Vital Signs BP (!) 170/88 (BP Location: Right Arm)    Pulse 80    Temp 98.5 F (36.9 C) (Oral)    Resp 19    Ht 5\' 2"  (1.575 m)    Wt 52.6 kg    SpO2 98%    BMI 21.22 kg/m  Physical Exam Vitals and nursing note reviewed.  Constitutional:      Appearance: Normal appearance.  HENT:     Head: Normocephalic and atraumatic.     Mouth/Throat:     Mouth: Mucous membranes are moist.  Cardiovascular:     Rate and Rhythm: Normal rate.     Pulses:          Dorsalis pedis pulses are 2+ on the right side.       Posterior tibial pulses are 2+ on the right side.  Pulmonary:     Effort: Pulmonary effort is normal.  Abdominal:     General: Abdomen is flat.  Musculoskeletal:        General: Swelling and tenderness present.     Cervical back: Normal range of motion and neck supple.  Right foot: Deformity and bunion present.  Feet:     Right foot:     Skin integrity: Erythema present.     Toenail Condition: Right toenails are normal.     Comments: Pulses present, capillary refill is intact throughout.  Significant swelling along the proximal aspect of the right great toe with significant pain. Skin:    General: Skin is warm and dry.  Neurological:     Mental Status: She is alert and oriented to person, place, and time.    ED Results / Procedures / Treatments   Labs (all labs ordered are listed, but only abnormal results are displayed) Labs Reviewed - No data to display  EKG None  Radiology DG Foot Complete Right  Result Date: 07/01/2021 CLINICAL DATA:  Right foot pain after stepping wrong on bleachers. Pain and swelling. EXAM: RIGHT FOOT COMPLETE - 3+ VIEW COMPARISON:  Radiograph  10/29/2019 FINDINGS: Mildly comminuted and displaced first toe proximal phalanx fracture. No intra-articular extension. No additional fracture of the foot. Moderate hallux valgus and degenerative change of the first metatarsal phalangeal joint. 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 Os navicular. Soft tissue edema overlies the dorsum of the foot. IMPRESSION: Mildly comminuted and displaced first toe proximal phalanx fracture. Electronically Signed   By: Keith Rake M.D.   On: 07/01/2021 20:26    Procedures Procedures    Medications Ordered in ED Medications  HYDROcodone-acetaminophen (NORCO/VICODIN) 5-325 MG per tablet 1 tablet (1 tablet Oral Given 07/01/21 2232)    ED Course/ Medical Decision Making/ A&P                           Medical Decision Making  Presents to the ED status post mechanical fall.  She was at a basketball game tonight when suddenly she had been to fall down bleachers, striking the right foot onto the bleachers.  There is pain along the right great toe on the proximal aspect.  She is unable to fully range her joint.  She reports taking some Tylenol at home but did not have any improvement in her symptoms.  During my evaluation patient is neurovascularly intact, there is no ending in the skin, signs of open fracture.  She is able to plantarflex and dorsiflex although plantarflexion causes severe pain.  Significant pain to the base of the right great toe.    X-ray of the right foot revealed: Mildly comminuted and displaced first toe proximal phalanx fracture.  These results were discussed at length with patient, she was provided with a copy of her x-ray.  She is followed by [facility], and had prior surgery to her left foot.  She was given Norco for pain control on today's visit.  She will go home with a short prescription for this.  She is advised to follow-up with them outpatient but she will be placed on a cam  walker, she does have crutches at home to help with her ambulation.  Patient is a recent management, patient is stable for discharge     Portions of this note were generated with [employer]. Dictation errors may occur despite best attempts at proofreading.  Final Clinical Impression(s) / ED Diagnoses Final diagnoses:  Nondisplaced fracture of distal phalanx of right great toe, initial encounter for closed fracture    Rx / DC Orders ED Discharge Orders          Ordered  HYDROcodone-acetaminophen (NORCO/VICODIN) 5-325 MG tablet  Every 4 hours PRN        07/01/21 2225              Janeece Fitting, PA-C 07/01/21 2234    Drenda Freeze, MD 07/02/21 [phone]

## 2021-07-01 NOTE — Progress Notes (Signed)
Orthopedic Tech Progress Note Patient Details:  [name] Oct 07, 1955 [id number]  Ortho Devices Type of Ortho Device: CAM walker Ortho Device/Splint Location: rle Ortho Device/Splint Interventions: Ordered, Application, Adjustment   Post Interventions Patient Tolerated: Well Instructions Provided: Care of device, Adjustment of device  Karolee Stamps 07/01/2021, 10:49 PM

## 2021-07-02 ENCOUNTER — Telehealth (HOSPITAL_COMMUNITY): Payer: Self-pay | Admitting: Emergency Medicine

## 2021-07-02 MED ORDER — HYDROCODONE-ACETAMINOPHEN 5-325 MG PO TABS: 1.0000 | ORAL_TABLET | Freq: Four times a day (QID) | ORAL | 0 refills | Status: DC | PRN

## 2021-07-02 NOTE — Telephone Encounter (Signed)
Pharmacy closed, asking for script sent to new pharmacy

## 2021-07-03 ENCOUNTER — Ambulatory Visit: Payer: Medicare Other | Admitting: Podiatry

## 2021-07-03 ENCOUNTER — Encounter: Payer: Self-pay | Admitting: Podiatry

## 2021-07-03 ENCOUNTER — Other Ambulatory Visit: Payer: Self-pay

## 2021-07-03 DIAGNOSIS — S92414A Nondisplaced fracture of proximal phalanx of right great toe, initial encounter for closed fracture: Secondary | ICD-10-CM | POA: Diagnosis not present

## 2021-07-03 NOTE — Progress Notes (Signed)
°  Subjective:  Patient ID: [name], female    DOB: 1955/11/17,   MRN: [id number]  Chief Complaint  Patient presents with   Injury    Pt injured Rt hallux - x Sat - 8/10 sharp occasional pain - w swelling and numbness -pt was seen at [facility] had xrays and was dz'd with a fx -pt was rx'd a boot and pain med     66 y.o. female presents for right hallux injury. Patient relates she fell two days ago in the bleachers at her nephews bball game. States she was seen in urgent care and told she broker her toe. Was given a CAM boot and told to follow-up. Continued to have pain in the toe but swelling is improving.  . Denies any other pedal complaints. Denies n/v/f/c.   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 CFT <3 seconds. Normal hair growth on digits. No edema.  Skin. No lacerations or abrasions bilateral feet. Echymosis noted to dorsal right hallux.  Musculoskeletal: MMT 5/5 bilateral lower extremities in DF, PF, Inversion and Eversion. Deceased ROM in DF of ankle joint. Tender to palpation of right hallux able to wiggle toes with some pain.  Neurological: Sensation intact to light touch.   Assessment:   1. Closed nondisplaced fracture of proximal phalanx of right great toe, initial encounter      Plan:  Patient was evaluated and treated and all questions answered. -Xrays reviewed. Non displaced fracture of proximal phalanx of right hallux.  -Discussed treatement options for toe fracture; risks, alternatives, and benefits explained. -Dispensed surgical shoe. Patient to wear at all times and instructed on use. Will use this instead  of the CAM boot as it will be easier on her other joint including her knee.  -Recommend protection, rest, ice, elevation daily until symptoms improve -Rx pain med/antinflammatories as needed -Patient to return to office in 4 weeks for serial x-rays to assess healing  or sooner if condition worsens.   Lorenda Peck, DPM

## 2021-07-07 ENCOUNTER — Other Ambulatory Visit (HOSPITAL_COMMUNITY): Payer: Self-pay

## 2021-07-07 ENCOUNTER — Other Ambulatory Visit: Payer: Self-pay

## 2021-07-10 ENCOUNTER — Other Ambulatory Visit (HOSPITAL_COMMUNITY): Payer: Self-pay

## 2021-07-10 ENCOUNTER — Telehealth: Payer: Self-pay | Admitting: Podiatry

## 2021-07-10 ENCOUNTER — Other Ambulatory Visit: Payer: Self-pay

## 2021-07-10 NOTE — Telephone Encounter (Signed)
Pt called requesting a refill on pain meds. She states she's still in pain. Please advise.

## 2021-07-11 MED ORDER — HYDROCODONE-ACETAMINOPHEN 5-325 MG PO TABS: 1.0000 | ORAL_TABLET | Freq: Four times a day (QID) | ORAL | 0 refills | Status: DC | PRN

## 2021-07-11 NOTE — Telephone Encounter (Signed)
Patient is calling to follow up on a refill on pain meds. She also is scheduled to see Dr. March Rummage on 02/07 which was next avail

## 2021-07-11 NOTE — Addendum Note (Signed)
Addended by: Hardie Pulley on: 07/11/2021 05:49 PM   Modules accepted: Orders

## 2021-07-24 ENCOUNTER — Other Ambulatory Visit (HOSPITAL_COMMUNITY): Payer: Self-pay

## 2021-07-31 ENCOUNTER — Other Ambulatory Visit (HOSPITAL_COMMUNITY): Payer: Self-pay

## 2021-07-31 ENCOUNTER — Ambulatory Visit: Payer: Medicare Other | Admitting: Podiatry

## 2021-08-01 ENCOUNTER — Ambulatory Visit: Payer: Medicare Other | Admitting: Podiatry

## 2021-08-07 ENCOUNTER — Other Ambulatory Visit (HOSPITAL_COMMUNITY): Payer: Self-pay

## 2021-08-08 ENCOUNTER — Ambulatory Visit (INDEPENDENT_AMBULATORY_CARE_PROVIDER_SITE_OTHER): Payer: Medicare Other | Admitting: Podiatry

## 2021-08-08 ENCOUNTER — Encounter: Payer: Self-pay | Admitting: Podiatry

## 2021-08-08 ENCOUNTER — Emergency Department (HOSPITAL_COMMUNITY)
Admission: EM | Admit: 2021-08-08 | Discharge: 2021-08-09 | Disposition: A | Discharge status: Home / Self Care | Payer: Medicare Other | Attending: Emergency Medicine | Admitting: Emergency Medicine

## 2021-08-08 ENCOUNTER — Ambulatory Visit (INDEPENDENT_AMBULATORY_CARE_PROVIDER_SITE_OTHER): Payer: Medicare Other

## 2021-08-08 ENCOUNTER — Encounter (HOSPITAL_COMMUNITY): Payer: Self-pay | Admitting: Emergency Medicine

## 2021-08-08 ENCOUNTER — Other Ambulatory Visit: Payer: Self-pay

## 2021-08-08 DIAGNOSIS — S92414A Nondisplaced fracture of proximal phalanx of right great toe, initial encounter for closed fracture: Secondary | ICD-10-CM | POA: Diagnosis not present

## 2021-08-08 DIAGNOSIS — R112 Nausea with vomiting, unspecified: Secondary | ICD-10-CM | POA: Diagnosis not present

## 2021-08-08 DIAGNOSIS — R631 Polydipsia: Secondary | ICD-10-CM | POA: Diagnosis not present

## 2021-08-08 DIAGNOSIS — R197 Diarrhea, unspecified: Secondary | ICD-10-CM | POA: Insufficient documentation

## 2021-08-08 DIAGNOSIS — Z5321 Procedure and treatment not carried out due to patient leaving prior to being seen by health care provider: Secondary | ICD-10-CM | POA: Insufficient documentation

## 2021-08-08 DIAGNOSIS — S92414D Nondisplaced fracture of proximal phalanx of right great toe, subsequent encounter for fracture with routine healing: Secondary | ICD-10-CM | POA: Diagnosis not present

## 2021-08-08 DIAGNOSIS — E119 Type 2 diabetes mellitus without complications: Secondary | ICD-10-CM | POA: Diagnosis not present

## 2021-08-08 LAB — CBC WITH DIFFERENTIAL/PLATELET
Abs Immature Granulocytes: 0.7 10*3/uL — ABNORMAL HIGH (ref 0.00–0.07)
Basophils Absolute: 0.1 10*3/uL (ref 0.0–0.1)
Basophils Relative: 0 %
Eosinophils Absolute: 0 10*3/uL (ref 0.0–0.5)
Eosinophils Relative: 0 %
HCT: 46.6 % — ABNORMAL HIGH (ref 36.0–46.0)
Hemoglobin: 14.8 g/dL (ref 12.0–15.0)
Immature Granulocytes: 4 %
Lymphocytes Relative: 8 %
Lymphs Abs: 1.6 10*3/uL (ref 0.7–4.0)
MCH: 27.1 pg (ref 26.0–34.0)
MCHC: 31.8 g/dL (ref 30.0–36.0)
MCV: 85.3 fL (ref 80.0–100.0)
Monocytes Absolute: 0.5 10*3/uL (ref 0.1–1.0)
Monocytes Relative: 2 %
Neutro Abs: 17.2 10*3/uL — ABNORMAL HIGH (ref 1.7–7.7)
Neutrophils Relative %: 86 %
Platelets: 459 10*3/uL — ABNORMAL HIGH (ref 150–400)
RBC: 5.46 MIL/uL — ABNORMAL HIGH (ref 3.87–5.11)
RDW: 13.7 % (ref 11.5–15.5)
WBC: 20 10*3/uL — ABNORMAL HIGH (ref 4.0–10.5)
nRBC: 0 % (ref 0.0–0.2)

## 2021-08-08 LAB — COMPREHENSIVE METABOLIC PANEL
ALT: 24 U/L (ref 0–44)
AST: 28 U/L (ref 15–41)
Albumin: 4.7 g/dL (ref 3.5–5.0)
Alkaline Phosphatase: 106 U/L (ref 38–126)
Anion gap: 19 — ABNORMAL HIGH (ref 5–15)
BUN: 20 mg/dL (ref 8–23)
CO2: 18 mmol/L — ABNORMAL LOW (ref 22–32)
Calcium: 10.4 mg/dL — ABNORMAL HIGH (ref 8.9–10.3)
Chloride: 99 mmol/L (ref 98–111)
Creatinine, Ser: 0.9 mg/dL (ref 0.44–1.00)
GFR, Estimated: >60 mL/min (ref >60)
Glucose, Bld: 396 mg/dL — ABNORMAL HIGH (ref 70–99)
Potassium: 2.7 mmol/L — CL (ref 3.5–5.1)
Sodium: 136 mmol/L (ref 135–145)
Total Bilirubin: 0.7 mg/dL (ref 0.3–1.2)
Total Protein: 8.8 g/dL — ABNORMAL HIGH (ref 6.5–8.1)

## 2021-08-08 LAB — I-STAT VENOUS BLOOD GAS, ED
Acid-base deficit: 3 mmol/L — ABNORMAL HIGH (ref 0.0–2.0)
Bicarbonate: 20.5 mmol/L (ref 20.0–28.0)
Calcium, Ion: 1.17 mmol/L (ref 1.15–1.40)
HCT: 47 % — ABNORMAL HIGH (ref 36.0–46.0)
Hemoglobin: 16 g/dL — ABNORMAL HIGH (ref 12.0–15.0)
O2 Saturation: 70 %
Potassium: 2.8 mmol/L — ABNORMAL LOW (ref 3.5–5.1)
Sodium: 140 mmol/L (ref 135–145)
TCO2: 22 mmol/L (ref 22–32)
pCO2, Ven: 32.3 mmHg — ABNORMAL LOW (ref 44–60)
pH, Ven: 7.411 (ref 7.25–7.43)
pO2, Ven: 35 mmHg (ref 32–45)

## 2021-08-08 LAB — CBG MONITORING, ED: Glucose-Capillary: 346 mg/dL — ABNORMAL HIGH (ref 70–99)

## 2021-08-08 LAB — LIPASE, BLOOD: Lipase: 53 U/L — ABNORMAL HIGH (ref 11–51)

## 2021-08-08 MED ORDER — ONDANSETRON 4 MG PO TBDP: 4.0000 mg | ORAL_TABLET | Freq: Once | ORAL | Status: DC

## 2021-08-08 NOTE — Progress Notes (Signed)
°  Subjective:  Patient ID: [name], female    DOB: 1956-05-26,  MRN: [id number]  Chief Complaint  Patient presents with   Foot Pain    The right big toe I bumped the other day and feels better in a shoe    66 y.o. female presents with the above complaint. History confirmed with patient. Has been wearing normal shoegear as it is more comfortable. Injury now over 101 month old - DOI 07/01/21. Bumped it the other day and had pain at the toe.  Objective:  Physical Exam: warm, good capillary refill, no trophic changes or ulcerative lesions, normal DP and PT pulses, and normal sensory exam.  Right Foot: POP right hallux. Hallux valgus deformity with prominent medial eminence.   No images are attached to the encounter.  Radiographs: X-ray of the right foot: right proximal phalanx fracture with interval displacement and significant bone callus noted. Assessment:   1. Closed nondisplaced fracture of proximal phalanx of right great toe with routine healing, subsequent encounter      Plan:  Patient was evaluated and treated and all questions answered.  Right hallux fracture -XR taken and reviewed. Interval displacement but significant bone callus. Discussed for this she will likely need surgical intervention at some point, but I do not think at this point proceeding would be very beneficial. She agrees she would like to wait until she has her bunion fixed. I think we could fix both issues at the time. She may ultimately just need IPJ arthroplasty given the displacement at the joint. -Continue immobilization. Switch to CAM boot. Dispensed today.  Return in about 3 weeks (around 08/29/2021) for Fracture f/u with XRs.

## 2021-08-08 NOTE — ED Triage Notes (Signed)
Pt brought to ED by [facility] via wheelchair with c/o vomiting and diarrhea since approximately 5pm. EMS reports CBG 416, pt denies any hx of diabetes. Pt also endorses excessive thirst. Denies any pain or urinary symptoms.

## 2021-08-08 NOTE — ED Provider Triage Note (Addendum)
Emergency Medicine Provider Triage Evaluation Note  MALEIYAH RELEFORD , a 66 y.o. female  was evaluated in triage.  Pt complains of n/v/d today. Too numerous to count episodes of vomiting & diarrhea, not able to keep anything down. Denies fever, abdominal pain, hematemesis, melena, or dysuria. Hx of DM, not on insulin. Denies pain at this time.   Review of Systems  Per HPI  Physical Exam  BP (!) 176/114    Pulse 100    Resp 18    Ht 5\' 2"  (1.575 m)    Wt 52.6 kg    SpO2 99%    BMI 21.22 kg/m  Gen:   Awake, no distress   Resp:  Normal effort  MSK:   Moves extremities without difficulty  Other:  Alert, clear speech, moving all extremities. Actively vomiting. Mild upper abdominal TTP, no peritoneal signs.   Medical Decision Making  Medically screening exam initiated at 10:21 PM.  Appropriate orders placed.  [name] was informed that the remainder of the evaluation will be completed by another provider, this initial triage assessment does not replace that evaluation, and the importance of remaining in the ED until their evaluation is complete.  N/V/D Hyperglycemia.  Zofran ordered.    Leafy Kindle 08/08/21 2315  23:45: Discussed w/ charge RN regarding rooming patient next in acute care area given laboratory abnormalities and persistent emesis.   00:05: Informed by waiting room NT that patient had left, I attempted to call the patient's phone number as well as the number she utilized to call for a ride without answer unfortunately. Will re-attempt shortly.      Amaryllis Dyke, PA-C 08/09/21 0010

## 2021-08-08 NOTE — ED Notes (Signed)
Patient incont of stool. Phlebotomist tyrone states patient requesting female to clean her up. Diaper put on patient and paper scrubs provided.

## 2021-08-09 NOTE — ED Notes (Signed)
Patient recliner and bathroom empty. When trying to call patient cell phone there was no answer. Registration states that the patient called someone to pick her up and leave

## 2021-08-11 ENCOUNTER — Emergency Department (HOSPITAL_COMMUNITY): Payer: Medicare Other

## 2021-08-11 ENCOUNTER — Other Ambulatory Visit: Payer: Self-pay

## 2021-08-11 ENCOUNTER — Emergency Department (HOSPITAL_COMMUNITY)
Admission: EM | Admit: 2021-08-11 | Discharge: 2021-08-11 | Disposition: A | Discharge status: Home / Self Care | Payer: Medicare Other | Attending: Emergency Medicine | Admitting: Emergency Medicine

## 2021-08-11 ENCOUNTER — Encounter (HOSPITAL_COMMUNITY): Payer: Self-pay

## 2021-08-11 DIAGNOSIS — E119 Type 2 diabetes mellitus without complications: Secondary | ICD-10-CM | POA: Insufficient documentation

## 2021-08-11 DIAGNOSIS — R059 Cough, unspecified: Secondary | ICD-10-CM | POA: Diagnosis not present

## 2021-08-11 DIAGNOSIS — R6889 Other general symptoms and signs: Secondary | ICD-10-CM

## 2021-08-11 DIAGNOSIS — I10 Essential (primary) hypertension: Secondary | ICD-10-CM | POA: Diagnosis not present

## 2021-08-11 DIAGNOSIS — R197 Diarrhea, unspecified: Secondary | ICD-10-CM | POA: Insufficient documentation

## 2021-08-11 DIAGNOSIS — R0981 Nasal congestion: Secondary | ICD-10-CM | POA: Insufficient documentation

## 2021-08-11 DIAGNOSIS — Z79899 Other long term (current) drug therapy: Secondary | ICD-10-CM | POA: Diagnosis not present

## 2021-08-11 DIAGNOSIS — R112 Nausea with vomiting, unspecified: Secondary | ICD-10-CM | POA: Diagnosis not present

## 2021-08-11 DIAGNOSIS — Z20822 Contact with and (suspected) exposure to covid-19: Secondary | ICD-10-CM | POA: Insufficient documentation

## 2021-08-11 DIAGNOSIS — J029 Acute pharyngitis, unspecified: Secondary | ICD-10-CM | POA: Diagnosis not present

## 2021-08-11 LAB — URINALYSIS, ROUTINE W REFLEX MICROSCOPIC
Bilirubin Urine: NEGATIVE
Glucose, UA: NEGATIVE mg/dL
Hgb urine dipstick: NEGATIVE
Ketones, ur: NEGATIVE mg/dL
Nitrite: NEGATIVE
Protein, ur: NEGATIVE mg/dL
Specific Gravity, Urine: 1.016 (ref 1.005–1.030)
pH: 5 (ref 5.0–8.0)

## 2021-08-11 LAB — CBC WITH DIFFERENTIAL/PLATELET
Abs Immature Granulocytes: 0.12 10*3/uL — ABNORMAL HIGH (ref 0.00–0.07)
Basophils Absolute: 0.1 10*3/uL (ref 0.0–0.1)
Basophils Relative: 1 %
Eosinophils Absolute: 0.1 10*3/uL (ref 0.0–0.5)
Eosinophils Relative: 1 %
HCT: 42.4 % (ref 36.0–46.0)
Hemoglobin: 14 g/dL (ref 12.0–15.0)
Immature Granulocytes: 1 %
Lymphocytes Relative: 18 %
Lymphs Abs: 2.2 10*3/uL (ref 0.7–4.0)
MCH: 27.8 pg (ref 26.0–34.0)
MCHC: 33 g/dL (ref 30.0–36.0)
MCV: 84.3 fL (ref 80.0–100.0)
Monocytes Absolute: 0.8 10*3/uL (ref 0.1–1.0)
Monocytes Relative: 6 %
Neutro Abs: 8.8 10*3/uL — ABNORMAL HIGH (ref 1.7–7.7)
Neutrophils Relative %: 73 %
Platelets: 378 10*3/uL (ref 150–400)
RBC: 5.03 MIL/uL (ref 3.87–5.11)
RDW: 13.9 % (ref 11.5–15.5)
WBC: 12.1 10*3/uL — ABNORMAL HIGH (ref 4.0–10.5)
nRBC: 0 % (ref 0.0–0.2)

## 2021-08-11 LAB — COMPREHENSIVE METABOLIC PANEL
ALT: 25 U/L (ref 0–44)
AST: 34 U/L (ref 15–41)
Albumin: 4 g/dL (ref 3.5–5.0)
Alkaline Phosphatase: 94 U/L (ref 38–126)
Anion gap: 11 (ref 5–15)
BUN: 39 mg/dL — ABNORMAL HIGH (ref 8–23)
CO2: 21 mmol/L — ABNORMAL LOW (ref 22–32)
Calcium: 9.3 mg/dL (ref 8.9–10.3)
Chloride: 101 mmol/L (ref 98–111)
Creatinine, Ser: 1.11 mg/dL — ABNORMAL HIGH (ref 0.44–1.00)
GFR, Estimated: 55 mL/min — ABNORMAL LOW (ref >60)
Glucose, Bld: 263 mg/dL — ABNORMAL HIGH (ref 70–99)
Potassium: 3.1 mmol/L — ABNORMAL LOW (ref 3.5–5.1)
Sodium: 133 mmol/L — ABNORMAL LOW (ref 135–145)
Total Bilirubin: 0.5 mg/dL (ref 0.3–1.2)
Total Protein: 7.8 g/dL (ref 6.5–8.1)

## 2021-08-11 LAB — LIPASE, BLOOD: Lipase: 30 U/L (ref 11–51)

## 2021-08-11 LAB — RESP PANEL BY RT-PCR (FLU A&B, COVID) ARPGX2
Influenza A by PCR: NEGATIVE
Influenza B by PCR: NEGATIVE
SARS Coronavirus 2 by RT PCR: NEGATIVE

## 2021-08-11 LAB — GROUP A STREP BY PCR: Group A Strep by PCR: NOT DETECTED

## 2021-08-11 MED ORDER — SODIUM CHLORIDE 0.9 % IV SOLN: INTRAVENOUS | Status: DC

## 2021-08-11 MED ORDER — SODIUM CHLORIDE 0.9 % IV BOLUS
1000.0000 mL | Freq: Once | INTRAVENOUS | Status: AC
Start: 2021-08-11 — End: 2021-08-11
  Administered 2021-08-11: 1000 mL via INTRAVENOUS

## 2021-08-11 NOTE — ED Notes (Signed)
Pt discharged. Instructions given. AAOX4. Pt in no apparent distress or pain. The opportunity to ask questions was provided.  

## 2021-08-11 NOTE — Discharge Instructions (Addendum)
Continue to rest at home.  Continue to hydrate yourself well.  Work-up here today strep negative COVID and influenza testing negative.  Cold and flu medicine as needed.  Return for any new or worse symptoms.

## 2021-08-11 NOTE — ED Provider Notes (Addendum)
[facility] DEPT Provider Note   CSN: [id number] Arrival date & time: 08/11/21  4098     History  Chief Complaint  Patient presents with   Diarrhea   Emesis   Sore Throat    [name] is a 66 y.o. female.  Patient with 4-day history, started on Tuesday with nausea vomiting and diarrhea.  The nausea vomiting and diarrhea is resolved as of today.  But is also associated with upper respiratory symptoms.  Patient with sore throat.  Some cough and congestion.  Patient was concerned that she had influenza.  Arrives here heart rate around 108.  Respirations around 22.  Afebrile.  Patient states that she she was not able to eat or drink very well this week.  Nausea vomiting and diarrhea with multiple episodes.  But now has resolved.  Patient denies any blood in the vomit or diarrhea.  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  It appears interventional radiology was involved in 2016.  Past surgical history otherwise significant for cholecystectomy.  Patient also currently being treated for a nondisplaced closed right great toe fracture by podiatry.  This occurred in early January.      Home Medications Prior to Admission medications   Medication Sig Start Date End Date Taking? Authorizing Provider  amLODipine (NORVASC) 10 MG tablet Take 1 tablet (10 mg total) by mouth daily. 05/15/21   [name], NP  DULoxetine (CYMBALTA) 30 MG capsule Take by mouth. 07/12/21   [provider]  gabapentin (NEURONTIN) 300 MG capsule 1 tab by mouth in the evening for 1 week, then 1 tab twice a day for 1 week, then 1 tab three times a day 06/14/21   [provider]  HYDROcodone-acetaminophen (NORCO/VICODIN) 5-325 MG tablet Take 1 tablet by mouth every 6 (six) hours as needed for up to 12 doses. 07/11/21   Evelina Bucy, DPM  losartan-hydrochlorothiazide (HYZAAR) 100-25 MG  tablet Take 1 tablet by mouth daily. 05/15/21   [name], NP  meloxicam (MOBIC) 15 MG tablet Take 15 mg by mouth daily. 07/12/21   [provider]  methylPREDNISolone (MEDROL DOSEPAK) 4 MG TBPK tablet TAKE 6 TABLETS ON DAY 1 AS DIRECTED ON PACKAGE AND DECREASE BY 1 TAB EACH DAY FOR A TOTAL OF 6 DAYS 07/12/21   [provider]  pantoprazole (PROTONIX) 40 MG tablet Take 1 tablet (40 mg total) by mouth daily. 10/27/20   Ladene Artist, MD  tiZANidine (ZANAFLEX) 2 MG tablet Take 1 tablet (2 mg total) by mouth every 6 (six) hours as needed for muscle spasms. Patient not taking: Reported on 05/15/2021 11/11/20   [name], NP      Allergies    Patient has no known allergies.    Review of Systems   Review of Systems  Constitutional:  Negative for chills and fever.  HENT:  Positive for congestion and sore throat. Negative for ear pain.   Eyes:  Negative for pain and visual disturbance.  Respiratory:  Positive for cough. Negative for shortness of breath.   Cardiovascular:  Negative for chest pain and palpitations.  Gastrointestinal:  Positive for diarrhea, nausea and vomiting. Negative for abdominal pain.  Genitourinary:  Negative for dysuria and hematuria.  Musculoskeletal:  Negative for arthralgias and back pain.  Skin:  Negative for color change and rash.  Neurological:  Negative for seizures and syncope.  All other systems reviewed and  are negative.  Physical Exam Updated Vital Signs BP (!) 151/110    Pulse (!) 115    Temp 98.6 F (37 C) (Oral)    Resp 14    Ht 1.575 m (5\' 2" )    Wt 52.6 kg    SpO2 100%    BMI 21.22 kg/m  Physical Exam Vitals and nursing note reviewed.  Constitutional:      General: She is not in acute distress.    Appearance: Normal appearance. She is well-developed.  HENT:     Head: Normocephalic and atraumatic.     Mouth/Throat:     Mouth: Mucous membranes are dry.     Pharynx: No oropharyngeal exudate or posterior  oropharyngeal erythema.  Eyes:     Extraocular Movements: Extraocular movements intact.     Conjunctiva/sclera: Conjunctivae normal.     Pupils: Pupils are equal, round, and reactive to light.  Cardiovascular:     Rate and Rhythm: Regular rhythm. Tachycardia present.     Heart sounds: No murmur heard. Pulmonary:     Effort: Pulmonary effort is normal. No respiratory distress.     Breath sounds: Normal breath sounds. No wheezing, rhonchi or rales.  Abdominal:     Palpations: Abdomen is soft.     Tenderness: There is no abdominal tenderness.  Musculoskeletal:        General: No swelling.     Cervical back: Normal range of motion and neck supple. No rigidity.  Skin:    General: Skin is warm and dry.     Capillary Refill: Capillary refill takes less than 2 seconds.  Neurological:     General: No focal deficit present.     Mental Status: She is alert and oriented to person, place, and time.  Psychiatric:        Mood and Affect: Mood normal.    ED Results / Procedures / Treatments   Labs (all labs ordered are listed, but only abnormal results are displayed) Labs Reviewed  CBC WITH DIFFERENTIAL/PLATELET - Abnormal; Notable for the following components:      Result Value   WBC 12.1 (*)    Neutro Abs 8.8 (*)    Abs Immature Granulocytes 0.12 (*)    All other components within normal limits  COMPREHENSIVE METABOLIC PANEL - Abnormal; Notable for the following components:   Sodium 133 (*)    Potassium 3.1 (*)    CO2 21 (*)    Glucose, Bld 263 (*)    BUN 39 (*)    Creatinine, Ser 1.11 (*)    GFR, Estimated 55 (*)    All other components within normal limits  URINALYSIS, ROUTINE W REFLEX MICROSCOPIC - Abnormal; Notable for the following components:   APPearance HAZY (*)    Leukocytes,Ua LARGE (*)    Bacteria, UA RARE (*)    All other components within normal limits  RESP PANEL BY RT-PCR (FLU A&B, COVID) ARPGX2  GROUP A STREP BY PCR  LIPASE, BLOOD     EKG None  Radiology No results found.  Procedures Procedures    Medications Ordered in ED Medications  0.9 %  sodium chloride infusion (has no administration in time range)  sodium chloride 0.9 % bolus 1,000 mL (has no administration in time range)    ED Course/ Medical Decision Making/ A&P                           Medical Decision Making Amount and/or Complexity  of Data Reviewed Radiology: ordered.  Risk Prescription drug management.   Patient's work-up here today COVID influenza testing negative.  Mild leukocytosis with a white count of 12.1.  Complete metabolic panel significant for some mild hypokalemia with a potassium of 3.1 and some mild hyponatremia.  But some of that is probably secondary to glucose 263.  CO2 at 21 anion gap is 11.  BUN and creatinine elevated GFR is 55.  Patient may be a little bit dehydrated.  Lipase normal.  Urinalysis negative as hazy large leukocytosis.  White blood cell count 6-10 no bacteria.  Patient is complaining of sore throat.  So we will get rapid strep and get a chest x-ray and hydrate.  Give a liter of fluid.  Patient probably little dehydrated from all the nausea vomiting and diarrhea.  Has stated COVID and influenza testing negative.  Rapid strep negative.  Chest x-ray no active disease.  No evidence of pneumonia patient received IV fluids here.  Patient feeling better.  Patient without any further vomiting or diarrhea.  Can treat symptomatically with over-the-counter cold and flu medicine.  Work note provided.   Final Clinical Impression(s) / ED Diagnoses Final diagnoses:  Flu-like symptoms    Rx / DC Orders ED Discharge Orders     None         Fredia Sorrow, MD 08/11/21 1910    Fredia Sorrow, MD 08/11/21 2316    Fredia Sorrow, MD 08/11/21 2316

## 2021-08-11 NOTE — ED Provider Triage Note (Signed)
Emergency Medicine Provider Triage Evaluation Note  [name] , a 66 y.o. female  was evaluated in triage.  Pt complains of n/v/d.  Mom and patient report for the past 4 days she has had recurrent nausea vomiting and diarrhea.  Emesis nonbloody stools nonbloody.  No abdominal pain no fever chills no other cold symptoms.  Unsure what triggers the symptoms.  Patient sounds hoarse.  Review of Systems  Positive: N/v/d Negative: Fever, chills, abd pain  Physical Exam  BP (!) 147/96 (BP Location: Right Arm)    Pulse (!) 111    Temp 98.6 F (37 C) (Oral)    Resp 16    Ht 5\' 2"  (1.575 m)    Wt 52.6 kg    SpO2 97%    BMI 21.22 kg/m  Gen:   Awake, no distress   Resp:  Normal effort  MSK:   Moves extremities without difficulty  Other:    Medical Decision Making  Medically screening exam initiated at 8:59 AM.  Appropriate orders placed.  [name] was informed that the remainder of the evaluation will be completed by another provider, this initial triage assessment does not replace that evaluation, and the importance of remaining in the ED until their evaluation is complete.     Domenic Moras, PA-C 08/11/21 0900

## 2021-08-11 NOTE — ED Triage Notes (Signed)
Patient reports that she began having V/D and a sore throat  3 days ago. Patient states she went to [facility] on 08/08/21, but left due to wait times.

## 2021-08-14 ENCOUNTER — Ambulatory Visit (INDEPENDENT_AMBULATORY_CARE_PROVIDER_SITE_OTHER): Payer: Self-pay

## 2021-08-14 NOTE — Telephone Encounter (Signed)
Third attempt to reach pt to discuss symptoms. Routing to provider for resolution per protocol.

## 2021-08-14 NOTE — Telephone Encounter (Signed)
Pt called saying she was in the ER Friday and they gave her some fluids because she had been throwing up.  She still does not know what is wrong with her.    She has no energy, does not feel like eating.  She just feels drained.   They told her she might have a virus  Please advise.   Left message to call back about symptoms.

## 2021-08-14 NOTE — Telephone Encounter (Signed)
Second attempt to reach patient- left message to call office °

## 2021-08-15 NOTE — Telephone Encounter (Signed)
Please discuss symptoms with patient at appointment on 2/23 if she presents.

## 2021-08-17 ENCOUNTER — Ambulatory Visit (INDEPENDENT_AMBULATORY_CARE_PROVIDER_SITE_OTHER): Payer: Medicare Other | Admitting: Primary Care

## 2021-08-17 ENCOUNTER — Other Ambulatory Visit: Payer: Self-pay

## 2021-08-17 VITALS — BP 126/79 | HR 94 | Temp 98.5°F | Ht 62.0 in | Wt 115.4 lb

## 2021-08-17 DIAGNOSIS — E119 Type 2 diabetes mellitus without complications: Secondary | ICD-10-CM | POA: Diagnosis not present

## 2021-08-17 DIAGNOSIS — Z09 Encounter for follow-up examination after completed treatment for conditions other than malignant neoplasm: Secondary | ICD-10-CM | POA: Diagnosis not present

## 2021-08-17 LAB — POCT GLYCOSYLATED HEMOGLOBIN (HGB A1C): Hemoglobin A1C: 7.9 % — AB (ref 4.0–5.6)

## 2021-08-17 MED ORDER — JANUMET 50-1000 MG PO TABS: 1.0000 | ORAL_TABLET | Freq: Two times a day (BID) | ORAL | 1 refills | Status: DC

## 2021-08-17 NOTE — Patient Instructions (Signed)

## 2021-08-17 NOTE — Progress Notes (Signed)
[facility]   Subjective:   [name] is a 67 y.o. female presents for hospital follow up.Presented to ED for  08/11/21,  brought to ED by [facility] via wheelchair with c/o vomiting and diarrhea.  EMS reports CBG 416, pt denies any hx of diabetes. Pt also endorses excessive thirst and dysphagia . Denies any pain or urinary symptoms. 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 90 tablet 1   DULoxetine (CYMBALTA) 30 MG capsule Take by mouth.     gabapentin (NEURONTIN) 300 MG capsule 1 tab by mouth in the evening for 1 week, then 1 tab twice a day for 1 week, then 1 tab three times a day     HYDROcodone-acetaminophen (NORCO/VICODIN) 5-325 MG tablet Take 1 tablet by mouth every 6 (six) hours as needed for up to 12 doses. 12 tablet 0   losartan-hydrochlorothiazide (HYZAAR) 100-25 MG tablet Take 1 tablet by mouth daily. 90 tablet 3   meloxicam (MOBIC) 15 MG tablet Take 15 mg by mouth daily.     methylPREDNISolone (MEDROL DOSEPAK) 4 MG TBPK tablet TAKE 6 TABLETS ON DAY 1 AS DIRECTED ON PACKAGE AND DECREASE BY 1 TAB EACH DAY FOR A TOTAL OF 6 DAYS     pantoprazole (PROTONIX) 40 MG tablet Take 1 tablet (40 mg total) by mouth daily. 90 tablet 3   tiZANidine (ZANAFLEX) 2 MG tablet Take 1 tablet (2 mg total) by mouth every 6 (six) hours as needed for muscle spasms. (Patient not taking: Reported on 05/15/2021) 60 tablet 1   No current facility-administered medications on file prior to visit.     Review of  System: Comprehensive ROS Pertinent positive and negative noted in HPI    Objective:  BP 126/79 (BP Location: Left Arm, Patient Position: Sitting, Cuff Size: Small)    Pulse 94    Temp 98.5 F (36.9 C) (Oral)    Ht 5\' 2"  (1.575 m)    Wt 115 lb 6.4 oz (52.3 kg)    SpO2 100%    BMI 21.11 kg/m   Filed Weights   08/17/21 1448  Weight: 115 lb 6.4 oz (52.3 kg)    Physical Exam: General Appearance: Well nourished, in no apparent distress. Eyes: PERRLA, EOMs, conjunctiva no swelling or erythema Sinuses: No Frontal/maxillary tenderness ENT/Mouth: Ext aud canals clear, TMs without erythema, bulging. No erythema, swelling, or exudate on post pharynx.  Tonsils not swollen or erythematous. Hearing normal.  Neck: Supple, thyroid normal.  Respiratory: Respiratory effort normal, BS equal bilaterally without rales, rhonchi, wheezing or stridor.  Cardio: RRR with no MRGs. Brisk peripheral pulses without edema.  Abdomen: Soft, + BS.  Non tender, no guarding, rebound, hernias, masses. Lymphatics: Non tender without lymphadenopathy.  Musculoskeletal: Full ROM, 5/5 strength, normal gait.  Skin: Warm, dry without rashes, lesions, ecchymosis.  Neuro: Cranial nerves intact. Normal muscle tone, no cerebellar symptoms. Sensation  intact.  Psych: Awake and oriented X 3, normal affect, Insight and Judgment appropriate.    Assessment:  [name] was seen today for hospitalization follow-up.  Diagnoses and all orders for this visit:  Type 2 diabetes mellitus without complication, without long-term current use of insulin (HCC) -     HgB A1c 7.9  -Monitor foods that are high in carbohydrates are the following rice, potatoes, breads, sugars, and pastas.  Reduction in the intake (eating) will assist in lowering your blood sugars.  -     sitaGLIPtin-metformin (JANUMET) 50-1000 MG tablet; Take 1 tablet by mouth 2 (two) times daily with a meal.  Hospital discharge follow-up Follow-Ups: Schedule an appointment with  [name], NP (Internal Medicine)     This note has been created with Dragon speech recognition software and smart phrase technology. Any transcriptional errors are unintentional.   [name], NP 08/20/2021, 11:36 PM

## 2021-08-29 ENCOUNTER — Ambulatory Visit: Payer: Medicare Other | Admitting: Podiatry

## 2021-08-29 ENCOUNTER — Ambulatory Visit (INDEPENDENT_AMBULATORY_CARE_PROVIDER_SITE_OTHER): Payer: Medicare Other

## 2021-08-29 ENCOUNTER — Encounter: Payer: Self-pay | Admitting: Podiatry

## 2021-08-29 ENCOUNTER — Other Ambulatory Visit: Payer: Self-pay

## 2021-08-29 DIAGNOSIS — S92414D Nondisplaced fracture of proximal phalanx of right great toe, subsequent encounter for fracture with routine healing: Secondary | ICD-10-CM | POA: Diagnosis not present

## 2021-08-29 NOTE — Progress Notes (Signed)
?  Subjective:  ?Patient ID: [name], female    DOB: 30-Oct-1955,   MRN: [id number] ? ?No chief complaint on file. ? ? ?66 y.o. female presents for follow-up of right great toe fracture. Has been in CAM boot. Relates that is doing ok States she does have some pain in this area. Relates she had discussed with Dr. March Rummage surgery to fix bunion and hammertoes. Denies any pain in these areas but is concerned about the squishing of her toes.  . Denies any other pedal complaints. Denies n/v/f/c.  ? ?Past Medical History:  ?Diagnosis Date  ? Anemia   ? hx  ? Aneurysm ([place])   ? small, on left side of brain   ? Arthritis   ? "all over" (06/19/2017)  ? Diabetes mellitus without complication ([place])   ? diet control, pt denies  ? Hyperlipidemia   ? Hypertension   ? TIA (transient ischemic attack)   ? pt unaware of this hx on 06/19/2017  ? ? ?Objective:  ?Physical Exam: ?Vascular: DP/PT pulses 2/4 bilateral. CFT <3 seconds. Normal hair growth on digits. No edema.  ?Skin. No lacerations or abrasions bilateral feet.  ?Musculoskeletal: MMT 5/5 bilateral lower extremities in DF, PF, Inversion and Eversion. Deceased ROM in DF of ankle joint.  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 No pain to lesser digits. No pain around MPJ or with ROM. There is hammering of digits 2-5 noted and lateral deviation of digits.  ?Neurological: Sensation intact to light touch.  ? ?Assessment:  ? ?1. Closed nondisplaced fracture of proximal phalanx of right great toe with routine healing, subsequent encounter   ? ? ? ?Plan:  ?Patient was evaluated and treated and all questions answered. ?-Xrays reviewed. Noted consolidation of proximal phalanx fracture. 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  ?-Discussed treatement options for toe fracture; risks, alternatives, and benefits explained. ?-May transition out of CAM boot and start to wear surgical shoes.  ?-Patient relates she was planning on having surgery on bunion and  hammertoes in the summer. Denies any signficant pain in these areas today. Discussed holding off on surgery until she has significant pain and may potentially need a hallux IPJ arthroplasty more than anything.  ?-Toe spacers provided to help with toes.  ?-Recommend protection, rest, ice, elevation daily until symptoms improve ?-Rx pain med/antinflammatories as needed ?-Patient to return to office as needed.  ? ? ?Lorenda Peck, DPM  ? ? ?

## 2021-08-31 ENCOUNTER — Other Ambulatory Visit (HOSPITAL_COMMUNITY): Payer: Self-pay

## 2021-09-08 ENCOUNTER — Other Ambulatory Visit (HOSPITAL_COMMUNITY): Payer: Self-pay

## 2021-09-30 ENCOUNTER — Other Ambulatory Visit: Payer: Self-pay

## 2021-09-30 ENCOUNTER — Emergency Department (HOSPITAL_COMMUNITY): Payer: Medicare Other

## 2021-09-30 ENCOUNTER — Encounter (HOSPITAL_COMMUNITY): Payer: Self-pay | Admitting: *Deleted

## 2021-09-30 ENCOUNTER — Emergency Department (HOSPITAL_COMMUNITY)
Admission: EM | Admit: 2021-09-30 | Discharge: 2021-09-30 | Disposition: A | Discharge status: Home / Self Care | Payer: Medicare Other | Attending: Emergency Medicine | Admitting: Emergency Medicine

## 2021-09-30 DIAGNOSIS — Z79899 Other long term (current) drug therapy: Secondary | ICD-10-CM | POA: Diagnosis not present

## 2021-09-30 DIAGNOSIS — R519 Headache, unspecified: Secondary | ICD-10-CM | POA: Insufficient documentation

## 2021-09-30 DIAGNOSIS — R202 Paresthesia of skin: Secondary | ICD-10-CM | POA: Diagnosis not present

## 2021-09-30 DIAGNOSIS — I10 Essential (primary) hypertension: Secondary | ICD-10-CM | POA: Diagnosis not present

## 2021-09-30 LAB — BASIC METABOLIC PANEL
Anion gap: 7 (ref 5–15)
BUN: 26 mg/dL — ABNORMAL HIGH (ref 8–23)
CO2: 27 mmol/L (ref 22–32)
Calcium: 9.6 mg/dL (ref 8.9–10.3)
Chloride: 105 mmol/L (ref 98–111)
Creatinine, Ser: 0.73 mg/dL (ref 0.44–1.00)
GFR, Estimated: >60 mL/min (ref >60)
Glucose, Bld: 120 mg/dL — ABNORMAL HIGH (ref 70–99)
Potassium: 4.1 mmol/L (ref 3.5–5.1)
Sodium: 139 mmol/L (ref 135–145)

## 2021-09-30 LAB — CBC WITH DIFFERENTIAL/PLATELET
Abs Immature Granulocytes: 0.03 10*3/uL (ref 0.00–0.07)
Basophils Absolute: 0.1 10*3/uL (ref 0.0–0.1)
Basophils Relative: 1 %
Eosinophils Absolute: 0.2 10*3/uL (ref 0.0–0.5)
Eosinophils Relative: 2 %
HCT: 36.8 % (ref 36.0–46.0)
Hemoglobin: 11.7 g/dL — ABNORMAL LOW (ref 12.0–15.0)
Immature Granulocytes: 0 %
Lymphocytes Relative: 20 %
Lymphs Abs: 1.9 10*3/uL (ref 0.7–4.0)
MCH: 27.3 pg (ref 26.0–34.0)
MCHC: 31.8 g/dL (ref 30.0–36.0)
MCV: 85.8 fL (ref 80.0–100.0)
Monocytes Absolute: 0.5 10*3/uL (ref 0.1–1.0)
Monocytes Relative: 5 %
Neutro Abs: 6.7 10*3/uL (ref 1.7–7.7)
Neutrophils Relative %: 72 %
Platelets: 176 10*3/uL (ref 150–400)
RBC: 4.29 MIL/uL (ref 3.87–5.11)
RDW: 14.1 % (ref 11.5–15.5)
WBC: 9.3 10*3/uL (ref 4.0–10.5)
nRBC: 0 % (ref 0.0–0.2)

## 2021-09-30 MED ORDER — HYDROCHLOROTHIAZIDE 12.5 MG PO CAPS
25.0000 mg | ORAL_CAPSULE | Freq: Once | ORAL | Status: AC
  Administered 2021-09-30: 25 mg via ORAL
  Filled 2021-09-30 (×2): qty 2

## 2021-09-30 MED ORDER — SODIUM CHLORIDE (PF) 0.9 % IJ SOLN
INTRAMUSCULAR | Status: AC
  Filled 2021-09-30: qty 50

## 2021-09-30 MED ORDER — IOHEXOL 350 MG/ML SOLN
75.0000 mL | Freq: Once | INTRAVENOUS | Status: AC | PRN
  Administered 2021-09-30: 75 mL via INTRAVENOUS

## 2021-09-30 NOTE — ED Notes (Signed)
Patient denies pain and is resting comfortably. She does a tingling to her head. ABCD's intact. ?

## 2021-09-30 NOTE — Discharge Instructions (Addendum)
Please read and follow all provided instructions. ? ?Your diagnoses today include:  ?1. Acute nonintractable headache, unspecified headache type   ?2. Paresthesia   ?3. Hypertension, unspecified type   ? ? ?Tests performed today include: ?CT of your head which was normal and did not show any serious cause of your headache, it shows a stable aneurysm dating back to at least 2019 ?Complete blood cell count: normal white blood cell ?Basic metabolic panel: normal kidney function ?Vital signs. See below for your results today.  ? ?Take any prescribed medications only as directed. ? ?Additional information:  ?Follow any educational materials contained in this packet. ? ?You are having a headache. No specific cause was found today for your headache. It may have been a migraine or other cause of headache. Stress, anxiety, fatigue, and depression are common triggers for headaches.  ? ?Your headache today does not appear to be life-threatening or require hospitalization, but often the exact cause of headaches is not determined in the emergency department. Therefore, follow-up with your doctor is very important to find out what may have caused your headache and whether or not you need any further diagnostic testing or treatment.  ? ?Sometimes headaches can appear benign (not harmful), but then more serious symptoms can develop which should prompt an immediate re-evaluation by your doctor or the emergency department. ? ?BE VERY CAREFUL not to take multiple medicines containing Tylenol (also called acetaminophen). Doing so can lead to an overdose which can damage your liver and cause liver failure and possibly death.  ? ?Follow-up instructions: ?Please follow-up with your primary care provider in the next 3 days for further evaluation of your symptoms.  ? ?Return instructions:  ?Please return to the Emergency Department if you experience worsening symptoms. ?Return if the medications do not resolve your headache, if it recurs,  or if you have multiple episodes of vomiting or cannot keep down fluids. ?Return if you have a change from the usual headache. ?RETURN IMMEDIATELY IF you: ?Develop a sudden, severe headache ?Develop confusion or become poorly responsive or faint ?Develop a fever above 100.16F or problem breathing ?Have a change in speech, vision, swallowing, or understanding ?Develop new weakness, numbness, tingling, incoordination in your arms or legs ?Have a seizure ?Please return if you have any other emergent concerns. ? ?Additional Information: ? ?Your vital signs today were: ?BP (!) 191/89 (BP Location: Left Arm)   Pulse 70   Temp 97.8 ?F (36.6 ?C) (Oral)   Resp 16   Ht '5\' 2"'$  (1.575 m)   Wt 52.6 kg   SpO2 99%   BMI 21.22 kg/m?  ?If your blood pressure (BP) was elevated above 135/85 this visit, please have this repeated by your doctor within one month. ?-------------- ? ?

## 2021-09-30 NOTE — ED Provider Notes (Signed)
?[facility] DEPT ?Provider Note ? ? ?CSN: [id number] ?Arrival date & time: 09/30/21  1040 ? ?  ? ?History ? ?No chief complaint on file. ? ? ?[name] is a 66 y.o. female. ? ?Patient with history of hypertension, known left paraophthalmic ICA aneurysm --presents to the emergency department for evaluation of posterior headache and facial paresthesias.  Patient states that her symptoms started last night when she was at work.  She is a caregiver.  She developed a sharp pain across the back of her head.  No associated lightheadedness or syncope.  No weakness in the arms or the legs.  No difficulty speaking or walking.  She states that she was considering coming to the emergency department last night, however her mother has dementia and she needed to go care for her.  She also notes her mother as a significant stressor.  This morning when she awoke she had bilateral facial paresthesias described as a "borderline tingling numbness".  Again no strokelike symptoms.  She is also noted her blood pressures have been running high over the past several days.  Typically the top number is in the 130s but recently has been more 160-170s.  She is taking her medications.  No chest pain or shortness of breath. ? ? ?  ? ?Home Medications ?Prior to Admission medications   ?Medication Sig Start Date End Date Taking? Authorizing Provider  ?amLODipine (NORVASC) 10 MG tablet Take 1 tablet (10 mg total) by mouth daily. 05/15/21   [name], NP  ?DULoxetine (CYMBALTA) 30 MG capsule Take by mouth. 07/12/21   [provider]  ?gabapentin (NEURONTIN) 300 MG capsule 1 tab by mouth in the evening for 1 week, then 1 tab twice a day for 1 week, then 1 tab three times a day 06/14/21   [provider]  ?HYDROcodone-acetaminophen (NORCO/VICODIN) 5-325 MG tablet Take 1 tablet by mouth every 6 (six) hours as needed for up to 12 doses. 07/11/21   Evelina Bucy, DPM   ?losartan-hydrochlorothiazide (HYZAAR) 100-25 MG tablet Take 1 tablet by mouth daily. 05/15/21   [name], NP  ?meloxicam (MOBIC) 15 MG tablet Take 15 mg by mouth daily. 07/12/21   [provider]  ?methylPREDNISolone (MEDROL DOSEPAK) 4 MG TBPK tablet TAKE 6 TABLETS ON DAY 1 AS DIRECTED ON PACKAGE AND DECREASE BY 1 TAB EACH DAY FOR A TOTAL OF 6 DAYS 07/12/21   [provider]  ?pantoprazole (PROTONIX) 40 MG tablet Take 1 tablet (40 mg total) by mouth daily. 10/27/20   Ladene Artist, MD  ?sitaGLIPtin-metformin (JANUMET) 50-1000 MG tablet Take 1 tablet by mouth 2 (two) times daily with a meal. 08/17/21   [name], NP  ?tiZANidine (ZANAFLEX) 2 MG tablet Take 1 tablet (2 mg total) by mouth every 6 (six) hours as needed for muscle spasms. ?Patient not taking: Reported on 05/15/2021 11/11/20   [name], NP  ?   ? ?Allergies    ?Patient has no known allergies.   ? ?Review of Systems   ?Review of Systems ? ?Physical Exam ?Updated Vital Signs ?BP (!) 173/113 (BP Location: Left Arm)   Pulse 96   Temp 97.8 ?F (36.6 ?C) (Oral)   Resp 18   Ht '5\' 2"'$  (1.575 m)   Wt 52.6 kg   SpO2 97%   BMI 21.22 kg/m?  ?Physical Exam ?Vitals and nursing note reviewed.  ?Constitutional:   ?   Appearance: She is well-developed.  ?HENT:  ?  Head: Normocephalic and atraumatic.  ?   Right Ear: External ear normal.  ?   Left Ear: External ear normal.  ?   Nose: Nose normal.  ?   Mouth/Throat:  ?   Pharynx: Uvula midline.  ?Eyes:  ?   General: Lids are normal.  ?   Extraocular Movements:  ?   Right eye: No nystagmus.  ?   Left eye: No nystagmus.  ?   Conjunctiva/sclera: Conjunctivae normal.  ?   Pupils: Pupils are equal, round, and reactive to light.  ?Cardiovascular:  ?   Rate and Rhythm: Normal rate and regular rhythm.  ?Pulmonary:  ?   Effort: Pulmonary effort is normal.  ?   Breath sounds: Normal breath sounds.  ?Abdominal:  ?   Palpations: Abdomen is soft.  ?   Tenderness: There is no  abdominal tenderness.  ?Musculoskeletal:  ?   Cervical back: Normal range of motion and neck supple. No tenderness or bony tenderness.  ?Skin: ?   General: Skin is warm and dry.  ?Neurological:  ?   Mental Status: She is alert and oriented to person, place, and time.  ?   GCS: GCS eye subscore is 4. GCS verbal subscore is 5. GCS motor subscore is 6.  ?   Sensory: No sensory deficit (normal facial sensation to light touch at time of exam).  ?   Motor: No weakness.  ?   Coordination: Coordination normal.  ?   Gait: Gait normal.  ?   Comments: Upper extremity myotomes tested bilaterally:  ?C5 Shoulder abduction 5/5 ?C6 Elbow flexion/wrist extension 5/5 ?C7 Elbow extension 5/5 ?C8 Finger flexion 5/5 ?T1 Finger abduction 5/5 ? ?Lower extremity myotomes tested bilaterally: ?L2 Hip flexion 5/5 ?L3 Knee extension 5/5 ?L4 Ankle dorsiflexion 5/5 ?S1 Ankle plantar flexion 5/5 ?  ? ? ?ED Results / Procedures / Treatments   ?Labs ?(all labs ordered are listed, but only abnormal results are displayed) ?Labs Reviewed  ?CBC WITH DIFFERENTIAL/PLATELET - Abnormal; Notable for the following components:  ?    Result Value  ? Hemoglobin 11.7 (*)   ? All other components within normal limits  ?BASIC METABOLIC PANEL - Abnormal; Notable for the following components:  ? Glucose, Bld 120 (*)   ? BUN 26 (*)   ? All other components within normal limits  ? ? ?EKG ?None ? ?Radiology ?CT Angio Head W or Wo Contrast ? ?Result Date: 09/30/2021 ?CLINICAL DATA:  Cerebral aneurysm, un treated. History left paraophthalmic aneurysm. Posterior headache last night with facial paresthesia. EXAM: CT ANGIOGRAPHY HEAD TECHNIQUE: Multidetector CT imaging of the head was performed using the standard protocol during bolus administration of intravenous contrast. Multiplanar CT image reconstructions and MIPs were obtained to evaluate the vascular anatomy. RADIATION DOSE REDUCTION: This exam was performed according to the departmental dose-optimization program which  includes automated exposure control, adjustment of the mA and/or kV according to patient size and/or use of iterative reconstruction technique. CONTRAST:  89m OMNIPAQUE IOHEXOL 350 MG/ML SOLN COMPARISON:  MRI 03/22/2021.  CT angiography 12/04/2017. FINDINGS: CT HEAD Brain: No sign of acute infarction or hemorrhage. Chronic small-vessel ischemic changes of the cerebral hemispheric white matter stable. No hydrocephalus or extra-axial collection. Vascular: There is atherosclerotic calcification of the major vessels at the base of the brain. Skull: Negative Sinuses: Clear Other: Normal CTA HEAD Anterior circulation: Both internal carotid arteries are patent through the skull base and siphon regions. No large vessel occlusion or correctable proximal stenosis. 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 Posterior circulation: Both vertebral arteries widely patent to the basilar. No basilar stenosis. Posterior circulation branch vessels are normal. Venous sinuses: Patent and normal. Anatomic variants: None significant. Review of the MIP images confirms the above findings. IMPRESSION: No acute head CT finding. No evidence of acute stroke or hemorrhage. Chronic small-vessel ischemic changes of the white matter. 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 Electronically Signed   By: Nelson Chimes M.D.   On: 09/30/2021 13:21   ? ?Procedures ?Procedures  ? ? ?Medications Ordered in ED ?Medications - No data to display ? ?ED Course/ Medical Decision Making/ A&P ?  ? ?Patient seen and examined. History obtained directly from patient. Reviewed previous imaging -- most recent CTA from 2019 showing stable 3-4 mm aneurysm.  Patient states that she currently follows with a neurologist in [place] due to some imbalance issues. ? ?Labs/EKG: Ordered CBC, BMP. ? ?Imaging: Ordered CTA of the head to ensure stability of the known  aneurysm. ? ?Medications/Fluids: None ordered.  Will continue to monitor blood pressure. ? ?Most recent vital signs reviewed and are as follows: ?BP (!) 173/113 (BP Location: Left Arm)   Pulse 96   Temp 97.8 ?F (36.6 ?C) (Oral)   Resp 18   Ht '5\' 2"'$

## 2021-09-30 NOTE — ED Notes (Signed)
Josh G., PA made aware of patients elevated blood pressure.  Patient denies symptoms and states she took her daily blood pressure medications. ?

## 2021-09-30 NOTE — ED Triage Notes (Signed)
Pt has had high blood pressure worsening for last 3 days, last night pain in back of head, generalized numbness in face this morning. Has taken BP meds  ?

## 2021-10-02 ENCOUNTER — Ambulatory Visit (INDEPENDENT_AMBULATORY_CARE_PROVIDER_SITE_OTHER): Payer: Medicare Other | Admitting: Primary Care

## 2021-10-02 ENCOUNTER — Ambulatory Visit (INDEPENDENT_AMBULATORY_CARE_PROVIDER_SITE_OTHER): Payer: Self-pay

## 2021-10-02 ENCOUNTER — Other Ambulatory Visit: Payer: Self-pay

## 2021-10-02 ENCOUNTER — Encounter (INDEPENDENT_AMBULATORY_CARE_PROVIDER_SITE_OTHER): Payer: Self-pay | Admitting: Primary Care

## 2021-10-02 VITALS — BP 173/99 | HR 86 | Temp 98.0°F | Ht 62.0 in | Wt 119.4 lb

## 2021-10-02 DIAGNOSIS — I10 Essential (primary) hypertension: Secondary | ICD-10-CM

## 2021-10-02 DIAGNOSIS — F4323 Adjustment disorder with mixed anxiety and depressed mood: Secondary | ICD-10-CM

## 2021-10-02 DIAGNOSIS — F17200 Nicotine dependence, unspecified, uncomplicated: Secondary | ICD-10-CM | POA: Diagnosis not present

## 2021-10-02 MED ORDER — BUSPIRONE HCL 5 MG PO TABS
5.0000 mg | ORAL_TABLET | Freq: Two times a day (BID) | ORAL | 3 refills | Status: DC
  Filled 2021-10-02: qty 60, 30d supply, fill #0

## 2021-10-02 NOTE — Telephone Encounter (Signed)
? ? ?  Chief Complaint: Elevated BP readings x 1 week ?Symptoms: 176/106 nausea ?Frequency: 1 week ago ?Pertinent Negatives: Patient denies  ?Disposition: '[]'$ ED /'[]'$ Urgent Care (no appt availability in office) / '[x]'$ Appointment(In office/virtual)/ '[]'$  [facility]/ '[]'$ Home Care/ '[]'$ Refused Recommended Disposition /'[]'$ [facility]/ '[]'$  Follow-up with PCP ?Additional Notes:   ?Reason for Disposition ? Systolic BP  >= 903 OR Diastolic >= 009 ? ?Answer Assessment - Initial Assessment Questions ?1. BLOOD PRESSURE: "What is the blood pressure?" "Did you take at least two measurements 5 minutes apart?" ?    176/106 ?2. ONSET: "When did you take your blood pressure?" ?    Last week ?3. HOW: "How did you obtain the blood pressure?" (e.g., visiting nurse, automatic home BP monitor) ?    Home cuff ?4. HISTORY: "Do you have a history of high blood pressure?" ?    Yes ?5. MEDICATIONS: "Are you taking any medications for blood pressure?" "Have you missed any doses recently?" ?    No missed doses ?6. OTHER SYMPTOMS: "Do you have any symptoms?" (e.g., headache, chest pain, blurred vision, difficulty breathing, weakness) ?    Nausea ?7. PREGNANCY: "Is there any chance you are pregnant?" "When was your last menstrual period?" ?    No ? ?Protocols used: Blood Pressure - High-A-AH ? ?

## 2021-10-02 NOTE — Patient Instructions (Signed)
Buspirone Tablets ?What is this medication? ?BUSPIRONE (byoo SPYE rone) treats anxiety. It works by balancing the levels of dopamine and serotonin in your brain, hormones that help regulate mood. ?This medicine may be used for other purposes; ask your health care provider or pharmacist if you have questions. ?COMMON BRAND NAME(S): BuSpar, Buspar Dividose ?What should I tell my care team before I take this medication? ?They need to know if you have any of these conditions: ?Kidney or liver disease ?An unusual or allergic reaction to buspirone, other medications, foods, dyes, or preservatives ?Pregnant or trying to get pregnant ?Breast-feeding ?How should I use this medication? ?Take this medication by mouth with a glass of water. Follow the directions on the prescription label. You may take this medication with or without food. To ensure that this medication always works the same way for you, you should take it either always with or always without food. Take your doses at regular intervals. Do not take your medication more often than directed. Do not stop taking except on the advice of your care team. ?Talk to your care team about the use of this medication in children. Special care may be needed. ?Overdosage: If you think you have taken too much of this medicine contact a poison control center or emergency room at once. ?NOTE: This medicine is only for you. Do not share this medicine with others. ?What if I miss a dose? ?If you miss a dose, take it as soon as you can. If it is almost time for your next dose, take only that dose. Do not take double or extra doses. ?What may interact with this medication? ?Do not take this medication with any of the following: ?Linezolid ?MAOIs like Carbex, Eldepryl, Marplan, Nardil, and Parnate ?Methylene blue ?Procarbazine ?This medication may also interact with the following: ?Diazepam ?Digoxin ?Diltiazem ?Erythromycin ?Grapefruit juice ?Haloperidol ?Medications for mental  depression or mood problems ?Medications for seizures like carbamazepine, phenobarbital and phenytoin ?Nefazodone ?Other medications for anxiety ?Rifampin ?Ritonavir ?Some antifungal medications like itraconazole, ketoconazole, and voriconazole ?Verapamil ?Warfarin ?This list may not describe all possible interactions. Give your health care provider a list of all the medicines, herbs, non-prescription drugs, or dietary supplements you use. Also tell them if you smoke, drink alcohol, or use illegal drugs. Some items may interact with your medicine. ?What should I watch for while using this medication? ?Visit your care team for regular checks on your progress. It may take 1 to 2 weeks before your anxiety gets better. ?You may get drowsy or dizzy. Do not drive, use machinery, or do anything that needs mental alertness until you know how this medication affects you. Do not stand or sit up quickly, especially if you are an older patient. This reduces the risk of dizzy or fainting spells. Alcohol can make you more drowsy and dizzy. Avoid alcoholic drinks. ?What side effects may I notice from receiving this medication? ?Side effects that you should report to your care team as soon as possible: ?Allergic reactions--skin rash, itching, hives, swelling of the face, lips, tongue, or throat ?Irritability, confusion, fast or irregular heartbeat, muscle stiffness, twitching muscles, sweating, high fever, seizure, chills, vomiting, diarrhea, which may be signs of serotonin syndrome ?Side effects that usually do not require medical attention (report to your care team if they continue or are bothersome): ?Anxiety or nervousness ?Dizziness ?Drowsiness ?Headache ?Nausea ?Trouble sleeping ?This list may not describe all possible side effects. Call your doctor for medical advice about side effects. You may report  side effects to FDA at 1-800-FDA-1088. ?Where should I keep my medication? ?Keep out of the reach of children. ?Store at room  temperature below 30 degrees C (86 degrees F). Protect from light. Keep container tightly closed. Throw away any unused medication after the expiration date. ?NOTE: This sheet is a summary. It may not cover all possible information. If you have questions about this medicine, talk to your doctor, pharmacist, or health care provider. ?? 2022 Elsevier/Gold Standard (2020-09-08 00:00:00) ? ?

## 2021-10-02 NOTE — Progress Notes (Signed)
?[place] ? ? ?[name] is a 66 y.o. female presents for hypertension evaluation, Denies shortness of breath,  chest pain or lower extremity edema, sudden onset, vision changes, unilateral weakness, dizziness, paresthesias . She does have intermittent headaches. Bp is elevated despite taking all her medications.  ? ?Patient reports adherence with medications. ? ?Dietary habits include: very cautious of intake  ?Exercise habits include:walking/walking  ?Family / Social history: mother and father CVD and mother T2D ? ? ?Past Medical History:  ?Diagnosis Date  ? Anemia   ? hx  ? 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 Arthritis   ? "all over" (06/19/2017)  ? Diabetes mellitus without complication ([place])   ? diet control, pt denies  ? Hyperlipidemia   ? Hypertension   ? TIA (transient ischemic attack)   ? pt unaware of this hx on 06/19/2017  ? ?Past Surgical History:  ?Procedure Laterality Date  ? ANTERIOR CERVICAL DECOMP/DISCECTOMY FUSION    ? BACK SURGERY    ? [place]  ? FOOT SURGERY Left 11/2019  ? IR GENERIC HISTORICAL  09/21/2014  ? IR ANGIO INTRA EXTRACRAN SEL INTERNAL CAROTID UNI L MOD SED 09/21/2014 [name], MD [facility]  ? IR GENERIC HISTORICAL  09/21/2014  ? IR ANGIO INTRA EXTRACRAN SEL COM CAROTID INNOMINATE UNI R MOD SED 09/21/2014 [name], MD [facility]  ? IR GENERIC HISTORICAL  09/21/2014  ? IR ANGIO VERTEBRAL SEL VERTEBRAL UNI L MOD SED 09/21/2014 [name], MD [facility]  ? IR GENERIC HISTORICAL  09/21/2014  ? IR 3D INDEPENDENT WKST 09/21/2014 [name], MD [facility]  ? LAPAROSCOPIC CHOLECYSTECTOMY    ? TUBAL LIGATION    ? ?No Known Allergies ?Current Outpatient Medications on File Prior to Visit  ?Medication Sig Dispense Refill  ? amLODipine (NORVASC) 10 MG tablet Take 1 tablet (10 mg total) by mouth daily. 90 tablet 1  ? DULoxetine (CYMBALTA) 30 MG capsule Take by mouth.    ? gabapentin (NEURONTIN) 300  MG capsule 1 tab by mouth in the evening for 1 week, then 1 tab twice a day for 1 week, then 1 tab three times a day    ? HYDROcodone-acetaminophen (NORCO/VICODIN) 5-325 MG tablet Take 1 tablet by mouth every 6 (six) hours as needed for up to 12 doses. 12 tablet 0  ? losartan-hydrochlorothiazide (HYZAAR) 100-25 MG tablet Take 1 tablet by mouth daily. 90 tablet 3  ? meloxicam (MOBIC) 15 MG tablet Take 15 mg by mouth daily.    ? methylPREDNISolone (MEDROL DOSEPAK) 4 MG TBPK tablet TAKE 6 TABLETS ON DAY 1 AS DIRECTED ON PACKAGE AND DECREASE BY 1 TAB EACH DAY FOR A TOTAL OF 6 DAYS    ? pantoprazole (PROTONIX) 40 MG tablet Take 1 tablet (40 mg total) by mouth daily. 90 tablet 3  ? sitaGLIPtin-metformin (JANUMET) 50-1000 MG tablet Take 1 tablet by mouth 2 (two) times daily with a meal. 180 tablet 1  ? tiZANidine (ZANAFLEX) 2 MG tablet Take 1 tablet (2 mg total) by mouth every 6 (six) hours as needed for muscle spasms. (Patient not taking: Reported on 05/15/2021) 60 tablet 1  ? ?No current facility-administered medications on file prior to visit.  ? ?Social History  ? ?Socioeconomic History  ? Marital status: Widowed  ?  Spouse name: Not on file  ? Number of children: 1  ? Years of education: Not on file  ? Highest education  level: Some college, no degree  ?Occupational History  ? Occupation: home health aide  ?  Comment: caregiver of dementia patient  ?Tobacco Use  ? Smoking status: Never  ? Smokeless tobacco: Never  ?Vaping Use  ? Vaping Use: Never used  ?Substance and Sexual Activity  ? Alcohol use: Yes  ?  Alcohol/week: 3.0 standard drinks  ?  Types: 3 Standard drinks or equivalent per week  ?  Comment: socially   ? Drug use: Not Currently  ? Sexual activity: Yes  ?Other Topics Concern  ? Not on file  ?Social History Narrative  ? 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  ? 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 ?Social Determinants of Health  ? ?Financial Resource  Strain: Not on file  ?Food Insecurity: Not on file  ?Transportation Needs: Not on file  ?Physical Activity: Not on file  ?Stress: Not on file  ?Social Connections: Not on file  ?Intimate Partner Violence: Not on file  ? ?Family History  ?Problem Relation Age of Onset  ? Hypertension Mother   ? Arthritis Mother   ? Diabetes Mother   ? Dementia Mother   ? Heart disease Father   ? Diabetes Sister   ? Diabetes Brother   ? Diabetes Sister   ? Cancer Neg Hx   ? Colon cancer Neg Hx   ? Colon polyps Neg Hx   ? Esophageal cancer Neg Hx   ? Rectal cancer Neg Hx   ? Stomach cancer Neg Hx   ? ? ? ?OBJECTIVE: ? ?Vitals:  ? 10/02/21 1100  ?BP: (!) 173/99  ?Pulse: 86  ?Temp: 98 ?F (36.7 ?C)  ?TempSrc: Oral  ?SpO2: 98%  ?Weight: 119 lb 6.4 oz (54.2 kg)  ?Height: '5\' 2"'$  (1.575 m)  ? ? ?Physical Exam ?Vitals reviewed.  ?Constitutional:   ?   Comments: Body mass index is 21.84 kg/m?.   ?HENT:  ?   Head: Normocephalic.  ?   Right Ear: Tympanic membrane and external ear normal.  ?   Left Ear: Tympanic membrane and external ear normal.  ?   Nose: Nose normal.  ?Eyes:  ?   Extraocular Movements: Extraocular movements intact.  ?   Pupils: Pupils are equal, round, and reactive to light.  ?Cardiovascular:  ?   Rate and Rhythm: Normal rate and regular rhythm.  ?Pulmonary:  ?   Effort: Pulmonary effort is normal.  ?   Breath sounds: Normal breath sounds.  ?Abdominal:  ?   General: Abdomen is flat. Bowel sounds are normal.  ?Musculoskeletal:     ?   General: Normal range of motion.  ?Skin: ?   General: Skin is warm and dry.  ?Neurological:  ?   Mental Status: She is alert and oriented to person, place, and time.  ?Psychiatric:     ?   Mood and Affect: Mood normal.     ?   Behavior: Behavior normal.     ?   Thought Content: Thought content normal.     ?   Judgment: Judgment normal.  ? ? ?ROS ?Comprehensive ROS Pertinent positive and negative noted in HPI   ?Last 3 Office BP readings: ?BP Readings from Last 3 Encounters:  ?10/02/21 (!) 173/99   ?09/30/21 (!) 150/104  ?08/17/21 126/79  ? ? ?BMET ?   ?Component Value Date/Time  ? NA 139 09/30/2021 1200  ? NA 141 10/18/2020 1619  ?  K 4.1 09/30/2021 1200  ? CL 105 09/30/2021 1200  ? CO2 27 09/30/2021 1200  ? GLUCOSE 120 (H) 09/30/2021 1200  ? BUN 26 (H) 09/30/2021 1200  ? BUN 18 10/18/2020 1619  ? CREATININE 0.73 09/30/2021 1200  ? CREATININE 0.72 02/24/2013 1721  ? CALCIUM 9.6 09/30/2021 1200  ? GFRNONAA >60 09/30/2021 1200  ? GFRAA 109 05/27/2020 1018  ? ? ?Renal function: ?Estimated Creatinine Clearance: 54 mL/min (by C-G formula based on SCr of 0.73 mg/dL). ? ?Clinical ASCVD: Yes  ?The 10-year ASCVD risk score (Arnett DK, et al., 2019) is: 38.8% ?  Values used to calculate the score: ?    Age: 3 years ?    Sex: Female ?    Is Non-Hispanic African American: Yes ?    Diabetic: Yes ?    Tobacco smoker: No ?    Systolic Blood Pressure: 149 mmHg ?    Is BP treated: Yes ?    HDL Cholesterol: 73 mg/dL ?    Total Cholesterol: 199 mg/dL ? ?ASCVD risk factors include- [place] ? ? ?ASSESSMENT & PLAN: ?Glayds was seen today for bp concerns . ? ?Diagnoses and all orders for this visit: ? 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 She agreed to take medication for anxiety which also, can increase Bp ?  ?Other orders ?-     busPIRone (BUSPAR) 5 MG tablet; Take 1 tablet (5 mg total) by mouth 2 (two) times daily. ? ? Essential hypertension ?-Counseled on lifestyle modifications for blood pressure control including reduced dietary sodium, increased exercise, weight reduction and adequate sleep. Also, educated patient about the risk for cardiovascular events, stroke and heart attack. Also counseled patient about the importance of medication adherence. If you participate in smoking, it is important to stop using tobacco as this will increase the risks associated with uncontrolled blood pressure.  ?Goal BP:  ?For patients younger than 60: Goal  BP < 130/80. ?For patients 60 and older: Goal BP < 140/90. ?For patients with diabetes: Goal BP < 130/80. ?Your most recent BP: 173/99 ? ?Minimize salt intake. ?Minimize alcohol intake ? ?Tobacco dependence ?-

## 2021-10-03 ENCOUNTER — Emergency Department (HOSPITAL_COMMUNITY)
Admission: EM | Admit: 2021-10-03 | Discharge: 2021-10-04 | Discharge status: Against Medical Advice | Payer: Medicare Other | Attending: Emergency Medicine | Admitting: Emergency Medicine

## 2021-10-03 ENCOUNTER — Encounter (HOSPITAL_COMMUNITY): Payer: Self-pay | Admitting: Emergency Medicine

## 2021-10-03 ENCOUNTER — Ambulatory Visit (INDEPENDENT_AMBULATORY_CARE_PROVIDER_SITE_OTHER): Payer: Self-pay | Admitting: *Deleted

## 2021-10-03 ENCOUNTER — Emergency Department (HOSPITAL_COMMUNITY): Payer: Medicare Other

## 2021-10-03 ENCOUNTER — Other Ambulatory Visit: Payer: Self-pay

## 2021-10-03 DIAGNOSIS — I10 Essential (primary) hypertension: Secondary | ICD-10-CM | POA: Insufficient documentation

## 2021-10-03 DIAGNOSIS — Z5321 Procedure and treatment not carried out due to patient leaving prior to being seen by health care provider: Secondary | ICD-10-CM | POA: Diagnosis not present

## 2021-10-03 DIAGNOSIS — Z79899 Other long term (current) drug therapy: Secondary | ICD-10-CM | POA: Insufficient documentation

## 2021-10-03 DIAGNOSIS — R519 Headache, unspecified: Secondary | ICD-10-CM | POA: Diagnosis present

## 2021-10-03 LAB — BASIC METABOLIC PANEL
Anion gap: 9 (ref 5–15)
BUN: 21 mg/dL (ref 8–23)
CO2: 23 mmol/L (ref 22–32)
Calcium: 10 mg/dL (ref 8.9–10.3)
Chloride: 107 mmol/L (ref 98–111)
Creatinine, Ser: 0.93 mg/dL (ref 0.44–1.00)
GFR, Estimated: >60 mL/min (ref >60)
Glucose, Bld: 141 mg/dL — ABNORMAL HIGH (ref 70–99)
Potassium: 3.8 mmol/L (ref 3.5–5.1)
Sodium: 139 mmol/L (ref 135–145)

## 2021-10-03 LAB — CBC
HCT: 37.7 % (ref 36.0–46.0)
Hemoglobin: 12.4 g/dL (ref 12.0–15.0)
MCH: 28.2 pg (ref 26.0–34.0)
MCHC: 32.9 g/dL (ref 30.0–36.0)
MCV: 85.9 fL (ref 80.0–100.0)
Platelets: 318 10*3/uL (ref 150–400)
RBC: 4.39 MIL/uL (ref 3.87–5.11)
RDW: 14 % (ref 11.5–15.5)
WBC: 11.1 10*3/uL — ABNORMAL HIGH (ref 4.0–10.5)
nRBC: 0 % (ref 0.0–0.2)

## 2021-10-03 LAB — TROPONIN I (HIGH SENSITIVITY)
Troponin I (High Sensitivity): 3 ng/L (ref <18)
Troponin I (High Sensitivity): 4 ng/L (ref <18)

## 2021-10-03 LAB — CBG MONITORING, ED: Glucose-Capillary: 144 mg/dL — ABNORMAL HIGH (ref 70–99)

## 2021-10-03 NOTE — ED Provider Triage Note (Signed)
Emergency Medicine Provider Triage Evaluation Note ? ?[name] , a 66 y.o. female  was evaluated in triage.  Pt complains of chest discomfort, hypertension, headache, and numb lips.  Patient was evaluated Saturday at the emergency department for the same. Patient states blood pressure has been fluctuating. She has been taking her HTN meds as prescribed. She endorses mild chest "discomfort" that began this afternoon. Denies shortness of breath. Endorses headache ? ?Review of Systems  ?Positive: Chest pain, headache, hypertension ?Negative: Shortness of breath ? ?Physical Exam  ?BP (!) 184/112 (BP Location: Left Arm)   Pulse (!) 105   Temp 98.1 ?F (36.7 ?C) (Oral)   Resp 17   Ht '5\' 2"'$  (1.575 m)   Wt 54 kg   SpO2 100%   BMI 21.77 kg/m?  ?Gen:   Awake, no distress   ?Resp:  Normal effort  ?MSK:   Moves extremities without difficulty  ?Other:   ? ?Medical Decision Making  ?Medically screening exam initiated at 6:34 PM.  Appropriate orders placed.  [name] was informed that the remainder of the evaluation will be completed by another provider, this initial triage assessment does not replace that evaluation, and the importance of remaining in the ED until their evaluation is complete. ? ? ?  ?Dorothyann Peng, PA-C ?10/03/21 1836 ? ?

## 2021-10-03 NOTE — Telephone Encounter (Signed)
?  Chief Complaint: elevated BP  ?Symptoms: face tingling, comes and goes. Left hand feels different and hx carpel tunnel, headache this am , vomiting . Blurred vision yesterday . Loose stools  ?Frequency: yesterday  ?Pertinent Negatives: Patient denies chest pain no difficulty breathing , no weakness on either side of body.  ?Disposition: '[x]'$ ED /'[]'$ Urgent Care (no appt availability in office) / '[]'$ Appointment(In office/virtual)/ '[]'$  [facility]/ '[]'$ Home Care/ '[]'$ Refused Recommended Disposition /'[]'$ [place] [facility]/ '[]'$  Follow-up with PCP ?Additional Notes:  ? ?Recommended to call 911. Patient reports she is at work and sitting with a bed bound person. Reports she is waiting on her brother to pick her up. Instructed patient not to drive and to call her boss to assist bed bound patient. ? ? ? Reason for Disposition ? [1] Numbness (i.e., loss of sensation) of the face, arm or leg on one side of the body AND [2] new-onset ? ?Answer Assessment - Initial Assessment Questions ?1. BLOOD PRESSURE: "What is the blood pressure?" "Did you take at least two measurements 5 minutes apart?" ?    BP 193/113 ?2. ONSET: "When did you take your blood pressure?" ?    Just now patient at work  ?3. HOW: "How did you obtain the blood pressure?" (e.g., visiting nurse, automatic home BP monitor) ?    na ?4. HISTORY: "Do you have a history of high blood pressure?" ?    yes ?5. MEDICATIONS: "Are you taking any medications for blood pressure?" "Have you missed any doses recently?" ?    Yes have not missed any doses ?6. OTHER SYMPTOMS: "Do you have any symptoms?" (e.g., headache, chest pain, blurred vision, difficulty breathing, weakness) ?    Face tingling comes and goes, headache this am, blurred vision yesterday  ?7. PREGNANCY: "Is there any chance you are pregnant?" "When was your last menstrual period?" ?    na ? ?Protocols used: Blood Pressure - High-A-AH ? ?

## 2021-10-03 NOTE — ED Triage Notes (Signed)
Per patient, states she was seen recently for elevated BP-states she is still have symptoms-meds not working-complaining of a headache and her chest feels "weird" ?

## 2021-10-04 ENCOUNTER — Emergency Department (HOSPITAL_COMMUNITY)
Admission: EM | Admit: 2021-10-04 | Discharge: 2021-10-04 | Disposition: A | Discharge status: Home / Self Care | Payer: Medicare Other | Source: Home / Self Care | Attending: Emergency Medicine | Admitting: Emergency Medicine

## 2021-10-04 ENCOUNTER — Other Ambulatory Visit: Payer: Self-pay

## 2021-10-04 ENCOUNTER — Encounter (HOSPITAL_COMMUNITY): Payer: Self-pay

## 2021-10-04 DIAGNOSIS — I10 Essential (primary) hypertension: Secondary | ICD-10-CM | POA: Insufficient documentation

## 2021-10-04 DIAGNOSIS — Z79899 Other long term (current) drug therapy: Secondary | ICD-10-CM | POA: Insufficient documentation

## 2021-10-04 MED ORDER — LOSARTAN POTASSIUM 25 MG PO TABS
100.0000 mg | ORAL_TABLET | Freq: Once | ORAL | Status: AC
  Administered 2021-10-04: 100 mg via ORAL
  Filled 2021-10-04: qty 4

## 2021-10-04 MED ORDER — HYDROCHLOROTHIAZIDE 25 MG PO TABS
25.0000 mg | ORAL_TABLET | Freq: Once | ORAL | Status: AC
  Administered 2021-10-04: 25 mg via ORAL
  Filled 2021-10-04: qty 1

## 2021-10-04 MED ORDER — AMLODIPINE BESYLATE 5 MG PO TABS
10.0000 mg | ORAL_TABLET | Freq: Once | ORAL | Status: AC
  Administered 2021-10-04: 10 mg via ORAL
  Filled 2021-10-04: qty 2

## 2021-10-04 MED ORDER — LOSARTAN POTASSIUM-HCTZ 100-25 MG PO TABS: 1.0000 | ORAL_TABLET | Freq: Once | ORAL | Status: DC

## 2021-10-04 NOTE — ED Provider Triage Note (Signed)
Emergency Medicine Provider Triage Evaluation Note ? ?[name] , a 66 y.o. female  was evaluated in triage.  Pt complains of headache, elevated blood pressures in the low 269S systolically, chest pain, lip numbness. The patient was triaged last night but left prior to being evaluated. No change in symptoms. Labs taken. ? ?Review of Systems  ?Positive:  ?Negative:  ? ?Physical Exam  ?BP (!) 154/95 (BP Location: Right Arm)   Pulse 99   Temp 98.4 ?F (36.9 ?C) (Oral)   Resp 17   SpO2 100%  ?Gen:   Awake, no distress   ?Resp:  Normal effort  ?MSK:   Moves extremities without difficulty  ?Other:  Cranial nerves II-XII intact. No facial droop. Finger to nose intact.  ? ?Medical Decision Making  ?Medically screening exam initiated at 7:22 AM.  Appropriate orders placed.  [name] was informed that the remainder of the evaluation will be completed by another provider, this initial triage assessment does not replace that evaluation, and the importance of remaining in the ED until their evaluation is complete. ? ?The patient has a h/o untreated cerebral aneurysm. Recent CT on 09/30/21, but given history and symptoms, will repeat. I reviewed the labs from yesterday and troponins are flat. She has a slightly elevated WBC at 11. ?  ?Sherrell Puller, PA-C ?10/04/21 8546 ? ?

## 2021-10-04 NOTE — ED Provider Notes (Signed)
?[facility] DEPT ?Provider Note ? ? ?CSN: [id number] ?Arrival date & time: 10/04/21  6294 ? ?  ? ?History ? ?Chief Complaint  ?Patient presents with  ? Hypertension  ? ? ?[name] is a 66 y.o. female. ? ?66 year old female with prior medical history as detailed below presents for evaluation.  Patient with longstanding history of hypertension.  Patient reports poorly controlled blood pressures over the last several days.  She was seen by her PCP earlier this week.  She was prescribed an anti-anxiety medication that was advisedly to help control her fluctuating blood pressure.  She reports compliance with her previously prescribed antihypertensives. ? ?Of note, patient was waiting for evaluation yesterday in the [facility] long ED.  She left after lengthy wait time.  Labs obtained at that time were without significant abnormality.  She returns today. ? ?She denies any specific complaint to this provider.  She specifically denies headache, chest pain, shortness of breath, nausea, vision change, weakness, etc.  She does request a work note. ? ?Initial triage evaluation as documented was obtained and CT imaging of the patient's head was ordered.  Patient is now declining CT imaging. ? ?The history is provided by the patient and medical records.  ?Hypertension ?This is a chronic problem. The current episode started more than 1 week ago. The problem occurs daily. The problem has been gradually improving. Pertinent negatives include no chest pain, no abdominal pain, no headaches and no shortness of breath. Nothing aggravates the symptoms. Nothing relieves the symptoms.  ? ?  ? ?Home Medications ?Prior to Admission medications   ?Medication Sig Start Date End Date Taking? Authorizing Provider  ?amLODipine (NORVASC) 10 MG tablet Take 1 tablet (10 mg total) by mouth daily. 05/15/21   [name], NP  ?busPIRone (BUSPAR) 5 MG tablet Take 1 tablet (5 mg total) by mouth 2 (two) times  daily. 10/02/21   [name], NP  ?DULoxetine (CYMBALTA) 30 MG capsule Take by mouth. 07/12/21   [provider]  ?gabapentin (NEURONTIN) 300 MG capsule 1 tab by mouth in the evening for 1 week, then 1 tab twice a day for 1 week, then 1 tab three times a day 06/14/21   [provider]  ?HYDROcodone-acetaminophen (NORCO/VICODIN) 5-325 MG tablet Take 1 tablet by mouth every 6 (six) hours as needed for up to 12 doses. 07/11/21   Evelina Bucy, DPM  ?losartan-hydrochlorothiazide (HYZAAR) 100-25 MG tablet Take 1 tablet by mouth daily. 05/15/21   [name], NP  ?meloxicam (MOBIC) 15 MG tablet Take 15 mg by mouth daily. 07/12/21   [provider]  ?methylPREDNISolone (MEDROL DOSEPAK) 4 MG TBPK tablet TAKE 6 TABLETS ON DAY 1 AS DIRECTED ON PACKAGE AND DECREASE BY 1 TAB EACH DAY FOR A TOTAL OF 6 DAYS 07/12/21   [provider]  ?pantoprazole (PROTONIX) 40 MG tablet Take 1 tablet (40 mg total) by mouth daily. 10/27/20   Ladene Artist, MD  ?sitaGLIPtin-metformin (JANUMET) 50-1000 MG tablet Take 1 tablet by mouth 2 (two) times daily with a meal. 08/17/21   [name], NP  ?tiZANidine (ZANAFLEX) 2 MG tablet Take 1 tablet (2 mg total) by mouth every 6 (six) hours as needed for muscle spasms. ?Patient not taking: Reported on 05/15/2021 11/11/20   [name], NP  ?   ? ?Allergies    ?Patient has no known allergies.   ? ?Review of Systems   ?Review of Systems  ?Respiratory:  Negative for shortness of breath.   ?Cardiovascular:  Negative for chest pain.  ?Gastrointestinal:  Negative for abdominal pain.  ?Neurological:  Negative for headaches.  ?All other systems reviewed and are negative. ? ?Physical Exam ?Updated Vital Signs ?BP (!) 154/95 (BP Location: Right Arm)   Pulse 99   Temp 98.4 ?F (36.9 ?C) (Oral)   Resp 17   SpO2 100%  ?Physical Exam ?Vitals and nursing note reviewed.  ?Constitutional:   ?   General: She is not in acute distress. ?   Appearance:  Normal appearance. She is well-developed.  ?HENT:  ?   Head: Normocephalic and atraumatic.  ?Eyes:  ?   Conjunctiva/sclera: Conjunctivae normal.  ?   Pupils: Pupils are equal, round, and reactive to light.  ?Cardiovascular:  ?   Rate and Rhythm: Normal rate and regular rhythm.  ?   Heart sounds: Normal heart sounds.  ?Pulmonary:  ?   Effort: Pulmonary effort is normal. No respiratory distress.  ?   Breath sounds: Normal breath sounds.  ?Abdominal:  ?   General: There is no distension.  ?   Palpations: Abdomen is soft.  ?   Tenderness: There is no abdominal tenderness.  ?Musculoskeletal:     ?   General: No deformity. Normal range of motion.  ?   Cervical back: Normal range of motion and neck supple.  ?Skin: ?   General: Skin is warm and dry.  ?Neurological:  ?   General: No focal deficit present.  ?   Mental Status: She is alert and oriented to person, place, and time. Mental status is at baseline.  ?   Cranial Nerves: No cranial nerve deficit.  ?   Sensory: No sensory deficit.  ?   Motor: No weakness.  ?   Coordination: Coordination normal.  ? ? ?ED Results / Procedures / Treatments   ?Labs ?(all labs ordered are listed, but only abnormal results are displayed) ?Labs Reviewed - No data to display ? ?EKG ?None ? ?Radiology ?DG Chest 2 View ? ?Result Date: 10/03/2021 ?CLINICAL DATA:  Hypertension EXAM: CHEST - 2 VIEW COMPARISON:  08/11/2021 FINDINGS: The heart size and mediastinal contours are within normal limits. Aortic calcifications are noted. Both lungs are clear. The visualized skeletal structures are unremarkable. Postsurgical changes in the cervical spine are seen. IMPRESSION: No active cardiopulmonary disease. Electronically Signed   By: Inez Catalina M.D.   On: 10/03/2021 19:21   ? ?Procedures ?Procedures  ? ? ?Medications Ordered in ED ?Medications  ?amLODipine (NORVASC) tablet 10 mg (10 mg Oral Given 10/04/21 0839)  ?losartan (COZAAR) tablet 100 mg (100 mg Oral Given 10/04/21 0839)  ?  And   ?hydrochlorothiazide (HYDRODIURIL) tablet 25 mg (25 mg Oral Given 10/04/21 0839)  ? ? ?ED Course/ Medical Decision Making/ A&P ?  ?                        ?Medical Decision Making ? ? ?Medical Screen Complete ? ?This patient presented to the ED with complaint of hypertension. ? ?This complaint involves an extensive number of treatment options. The initial differential diagnosis includes, but is not limited to, poorly controlled hypertension, etc. ? ?This presentation is: Chronic, Self-Limited, Previously Undiagnosed, Uncertain Prognosis, Complicated, Systemic Symptoms, and Threat to Life/Bodily Function ? ? ?Patient with longstanding history of hypertension.  She is presenting today with complaint of poorly controlled blood pressures at home.  She reports compliance with previously prescribed antihypertensives.  However, patient  has not yet taken her a.m. medications. ? ?She was seen by her PCP earlier this week.  She was prescribed an antianxiety medication which she has not yet started.  Per the patient's report, treating her anxiety with help with her blood pressure. ? ?Patient without evidence on history or exam of endorgan damage ? ?Patient was offered CT imaging of her brain.  She declines.  She desires discharge.  She is requesting a work note. ? ?Patient does understand need for close follow-up.  Strict return precautions given and understood.   ? ? ?Co morbidities that complicated the patient's evaluation ? ?Chronic hypertension ? ? ?Additional history obtained: ? ?External records from outside sources obtained and reviewed including prior ED visits and prior Inpatient records.  ? ?Imaging Studies ordered: ? ?I ordered imaging studies including CT head ?Patient declined CT imaging. ? ?Problem List / ED Course: ? ?Poorly controlled hypertension ? ? ?Reevaluation: ? ?After the interventions noted above, I reevaluated the patient and found that they have: improved ? ?Disposition: ? ?After consideration of the  diagnostic results and the patients response to treatment, I feel that the patent would benefit from close outpatient follow-up.  ? ? ? ? ? ? ? ? ?Final Clinical Impression(s) / ED Diagnoses ?Final diagnoses:  ?Hypertension, unspec

## 2021-10-04 NOTE — ED Triage Notes (Signed)
Patient said she left because the wait was too long last night. Came back and said her hypertension kept her up last night.  ?

## 2021-10-04 NOTE — Discharge Instructions (Addendum)
Return for any problem.  ? ?Take your blood pressure medications as previously prescribed.  ?

## 2021-10-05 ENCOUNTER — Other Ambulatory Visit: Payer: Self-pay

## 2021-10-05 ENCOUNTER — Ambulatory Visit (INDEPENDENT_AMBULATORY_CARE_PROVIDER_SITE_OTHER): Payer: Self-pay

## 2021-10-05 ENCOUNTER — Telehealth (INDEPENDENT_AMBULATORY_CARE_PROVIDER_SITE_OTHER): Payer: Medicare Other | Admitting: Primary Care

## 2021-10-05 DIAGNOSIS — R112 Nausea with vomiting, unspecified: Secondary | ICD-10-CM

## 2021-10-05 DIAGNOSIS — I10 Essential (primary) hypertension: Secondary | ICD-10-CM

## 2021-10-05 NOTE — Telephone Encounter (Signed)
?  Chief Complaint: HTN ?Symptoms: Bps elevated 173/110, nausea, headache, feeling weak and drained ?Frequency: 2 weeks ?Pertinent Negatives: NA ?Disposition: '[]'$ ED /'[]'$ Urgent Care (no appt availability in office) / '[x]'$ Appointment(In office/virtual)/ '[]'$  [facility]/ '[]'$ Home Care/ '[]'$ Refused Recommended Disposition /'[]'$ [facility]/ '[]'$  Follow-up with PCP ?Additional Notes: pt states she has been going thru this for 2 weeks now and feels awful.she has been to the ED multiple times and she states they don't really do anything for her beside give her meds there then send her home. Pt states she isn't going back to the ED for this and is wanting something done to help her feel better. Sharyn Lull, NP had appt for today at 1530 so scheduled and told pt to come in for OV. Pt states she will be there.  ? ?Reason for Disposition ? Systolic BP  >= 209 OR Diastolic >= 470 ? ?Answer Assessment - Initial Assessment Questions ?1. BLOOD PRESSURE: "What is the blood pressure?" "Did you take at least two measurements 5 minutes apart?" ?    173/110 few mins before calling ?2. ONSET: "When did you take your blood pressure?" ?    2 weeks  ?3. HOW: "How did you obtain the blood pressure?" (e.g., visiting nurse, automatic home BP monitor) ?    Yes auto cuff ?4. HISTORY: "Do you have a history of high blood pressure?" ?    yes ?5. MEDICATIONS: "Are you taking any medications for blood pressure?" "Have you missed any doses recently?" ?    Yes, no ?6. OTHER SYMPTOMS: "Do you have any symptoms?" (e.g., headache, chest pain, blurred vision, difficulty breathing, weakness) ?    Weakness, nausea, vomiting, headache, tingling in face ? ?Protocols used: Blood Pressure - High-A-AH ? ?

## 2021-10-06 ENCOUNTER — Other Ambulatory Visit (INDEPENDENT_AMBULATORY_CARE_PROVIDER_SITE_OTHER): Payer: Self-pay | Admitting: Primary Care

## 2021-10-06 DIAGNOSIS — I10 Essential (primary) hypertension: Secondary | ICD-10-CM

## 2021-10-08 ENCOUNTER — Emergency Department (HOSPITAL_COMMUNITY)
Admission: EM | Admit: 2021-10-08 | Discharge: 2021-10-08 | Discharge status: Against Medical Advice | Payer: Medicare Other | Attending: Emergency Medicine | Admitting: Emergency Medicine

## 2021-10-08 ENCOUNTER — Encounter (HOSPITAL_COMMUNITY): Payer: Self-pay | Admitting: Emergency Medicine

## 2021-10-08 DIAGNOSIS — Z5321 Procedure and treatment not carried out due to patient leaving prior to being seen by health care provider: Secondary | ICD-10-CM | POA: Insufficient documentation

## 2021-10-08 DIAGNOSIS — R030 Elevated blood-pressure reading, without diagnosis of hypertension: Secondary | ICD-10-CM | POA: Diagnosis not present

## 2021-10-08 NOTE — ED Notes (Signed)
Per registration, patient reports she is leaving. ?

## 2021-10-08 NOTE — ED Triage Notes (Signed)
Patient reports hypertensive for three weeks. States being seen for same recently. Reports PCP started on anxiety medicine since with little relief. ?

## 2021-10-08 NOTE — ED Provider Triage Note (Signed)
Emergency Medicine Provider Triage Evaluation Note ? ?[name] , a 66 y.o. female  was evaluated in triage.  Pt complains of high blood pressure ? ?Review of Systems  ?Positive: Anxiety ?Negative: No chest pain or shortness of breath ? ?Physical Exam  ?BP (!) 154/98   Pulse 95   Temp 98.5 ?F (36.9 ?C)   Resp 20   SpO2 97%  ?Gen:   Awake, no distress   ?Resp:  Normal effort  ?MSK:   Moves extremities without difficulty  ?Other:   ? ?Medical Decision Making  ?Medically screening exam initiated at 4:17 PM.  Appropriate orders placed.  [name] was informed that the remainder of the evaluation will be completed by another provider, this initial triage assessment does not replace that evaluation, and the importance of remaining in the ED until their evaluation is complete. ? ? ?  ?Fransico Meadow, [place] ?10/08/21 1617 ? ?

## 2021-10-09 ENCOUNTER — Other Ambulatory Visit: Payer: Self-pay

## 2021-10-09 ENCOUNTER — Encounter: Payer: Self-pay | Admitting: *Deleted

## 2021-10-10 ENCOUNTER — Encounter: Payer: Self-pay | Admitting: Student

## 2021-10-10 ENCOUNTER — Ambulatory Visit: Payer: Medicare Other | Admitting: Student

## 2021-10-10 VITALS — BP 147/91 | HR 98 | Temp 98.0°F | Resp 16 | Ht 62.0 in | Wt 113.0 lb

## 2021-10-10 DIAGNOSIS — I517 Cardiomegaly: Secondary | ICD-10-CM

## 2021-10-10 DIAGNOSIS — I10 Essential (primary) hypertension: Secondary | ICD-10-CM

## 2021-10-10 DIAGNOSIS — R5383 Other fatigue: Secondary | ICD-10-CM

## 2021-10-10 MED ORDER — METOPROLOL SUCCINATE ER 25 MG PO TB24: 25.0000 mg | ORAL_TABLET | Freq: Every day | ORAL | 3 refills | Status: DC

## 2021-10-10 NOTE — Progress Notes (Signed)
? ?Primary Physician/Referring:  [name], NP ? ?Patient ID: [name], female    DOB: June 09, 1956, 66 y.o.   MRN: [id number] ? ?Chief Complaint  ?Patient presents with  ? Hypertension  ? New Patient (Initial Visit)  ?  Referred by Juluis Mire, NP  ? ?HPI:   ? ?[name]  is a 66 y.o. AA female with history of hypertension, hyperlipidemia, type 2 diabetes mellitus.  Patient reports her father died of an MI at an unknown age, but suspects prior to 67 years old.  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 Patient is referred to our office By PCP for evaluation and management of resistant hypertension. ? ?Patient reports a longstanding history of hypertension, however that over the last 2 weeks blood pressure has been elevated above goal and she has been experiencing fatigue.  Patient works as a Programmer, applications and has noticed she gets tired much more quickly while at work.  Denies chest pain, dyspnea, dizziness, syncope, near syncope.  Denies symptoms suggestive of TIA or CVA. ? ?Past Medical History:  ?Diagnosis Date  ? Anemia   ? hx  ? Aneurysm ([place])   ? small, on left side of brain   ? Arthritis   ? "all over" (06/19/2017)  ? Diabetes mellitus without complication ([place])   ? diet control, pt denies  ? Hyperlipidemia   ? Hypertension   ? TIA (transient ischemic attack)   ? pt unaware of this hx on 06/19/2017  ? ?Past Surgical History:  ?Procedure Laterality Date  ? ANTERIOR CERVICAL DECOMP/DISCECTOMY FUSION    ? BACK SURGERY    ? [place]  ? FOOT SURGERY Left 11/2019  ? IR GENERIC HISTORICAL  09/21/2014  ? IR ANGIO INTRA EXTRACRAN SEL INTERNAL CAROTID UNI L MOD SED 09/21/2014 [name], MD [facility]  ? IR GENERIC HISTORICAL  09/21/2014  ? IR ANGIO INTRA EXTRACRAN SEL COM CAROTID INNOMINATE UNI R MOD SED 09/21/2014 [name], MD [facility]  ? IR  GENERIC HISTORICAL  09/21/2014  ? IR ANGIO VERTEBRAL SEL VERTEBRAL UNI L MOD SED 09/21/2014 [name], MD [facility]  ? IR GENERIC HISTORICAL  09/21/2014  ? IR 3D INDEPENDENT WKST 09/21/2014 [name], MD [facility]  ? LAPAROSCOPIC CHOLECYSTECTOMY    ? TUBAL LIGATION    ? ?Family History  ?Problem Relation Age of Onset  ? Hypertension Mother   ? Arthritis Mother   ? Diabetes Mother   ? Dementia Mother   ? Heart attack Father   ? Heart disease Father   ? Hypertension Sister   ? Diabetes Sister   ? Hypertension Sister   ? Heart disease Sister   ? Diabetes Sister   ? Hypertension Brother   ? Heart disease Brother   ? Diabetes Brother   ? Hypertension Brother   ? Hypertension Brother   ? Hypertension Brother   ? Cancer Neg Hx   ? Colon cancer Neg Hx   ? Colon polyps Neg Hx   ? Esophageal cancer Neg Hx   ? Rectal cancer Neg Hx   ? Stomach cancer Neg Hx   ?  ?Social History  ? ?Tobacco Use  ? Smoking status: Never  ? Smokeless tobacco: Never  ?Substance Use Topics  ? Alcohol use: Yes  ?  Alcohol/week: 3.0 standard drinks  ?  Types: 3  Standard drinks or equivalent per week  ?  Comment: occ  ? ?Marital Status: Widowed  ? ?ROS  ?Review of Systems  ?Constitutional: Positive for malaise/fatigue.  ?Cardiovascular:  Negative for chest pain, claudication, dyspnea on exertion, leg swelling, near-syncope, orthopnea, palpitations, paroxysmal nocturnal dyspnea and syncope.  ?Neurological:  Negative for dizziness.  ? ?Objective  ?Blood pressure (!) 147/91, pulse 98, temperature 98 ?F (36.7 ?C), resp. rate 16, height '5\' 2"'$  (1.575 m), weight 113 lb (51.3 kg), SpO2 99 %.  ? ?  10/10/2021  ? 10:33 AM 10/10/2021  ? 10:20 AM 10/08/2021  ?  4:19 PM  ?Vitals with BMI  ?Height  '5\' 2"'$    ?Weight  113 lbs   ?BMI  20.66   ?Systolic 962 952 841  ?Diastolic 91 98 93  ?Pulse 98 103   ?  ?Physical Exam ?Vitals reviewed.  ?Cardiovascular:  ?   Rate and Rhythm: Normal rate and regular rhythm.  ?   Pulses: Intact distal pulses.  ?   Heart  sounds: S1 normal and S2 normal. No murmur heard. ?  No gallop.  ?Pulmonary:  ?   Effort: Pulmonary effort is normal. No respiratory distress.  ?   Breath sounds: No wheezing, rhonchi or rales.  ?Musculoskeletal:  ?   Right lower leg: No edema.  ?   Left lower leg: No edema.  ?Neurological:  ?   Mental Status: She is alert.  ? ?Laboratory examination:  ? ?Recent Labs  ?  08/11/21 ?0916 09/30/21 ?1200 10/03/21 ?1849  ?NA 133* 139 139  ?K 3.1* 4.1 3.8  ?CL 101 105 107  ?CO2 21* 27 23  ?GLUCOSE 263* 120* 141*  ?BUN 39* 26* 21  ?CREATININE 1.11* 0.73 0.93  ?CALCIUM 9.3 9.6 10.0  ?GFRNONAA 55* >60 >60  ? ?estimated creatinine clearance is 46.4 mL/min (by C-G formula based on SCr of 0.93 mg/dL).  ? ?  Latest Ref Rng & Units 10/03/2021  ?  6:49 PM 09/30/2021  ? 12:00 PM 08/11/2021  ?  9:16 AM  ?CMP  ?Glucose 70 - 99 mg/dL 141   120   263    ?BUN 8 - 23 mg/dL 21   26   39    ?Creatinine 0.44 - 1.00 mg/dL 0.93   0.73   1.11    ?Sodium 135 - 145 mmol/L 139   139   133    ?Potassium 3.5 - 5.1 mmol/L 3.8   4.1   3.1    ?Chloride 98 - 111 mmol/L 107   105   101    ?CO2 22 - 32 mmol/L '23   27   21    '$ ?Calcium 8.9 - 10.3 mg/dL 10.0   9.6   9.3    ?Total Protein 6.5 - 8.1 g/dL   7.8    ?Total Bilirubin 0.3 - 1.2 mg/dL   0.5    ?Alkaline Phos 38 - 126 U/L   94    ?AST 15 - 41 U/L   34    ?ALT 0 - 44 U/L   25    ? ? ?  Latest Ref Rng & Units 10/03/2021  ?  6:49 PM 09/30/2021  ? 12:00 PM 08/11/2021  ?  9:16 AM  ?CBC  ?WBC 4.0 - 10.5 K/uL 11.1   9.3   12.1    ?Hemoglobin 12.0 - 15.0 g/dL 12.4   11.7   14.0    ?Hematocrit 36.0 - 46.0 % 37.7   36.8  42.4    ?Platelets 150 - 400 K/uL 318   176   378    ? ? ?Lipid Panel ?No results for input(s): CHOL, TRIG, LDLCALC, VLDL, HDL, CHOLHDL, LDLDIRECT in the last 8760 hours. ? ?HEMOGLOBIN A1C ?Lab Results  ?Component Value Date  ? HGBA1C 7.9 (A) 08/17/2021  ? MPG 140 10/09/2020  ? ?TSH ?No results for input(s): TSH in the last 8760 hours. ? ?External labs:  ?None ? ?Allergies  ?No Known Allergies   ? ?Medications Prior to Visit:  ? ?Outpatient Medications Prior to Visit  ?Medication Sig Dispense Refill  ? amLODipine (NORVASC) 10 MG tablet Take 1 tablet (10 mg total) by mouth daily. 90 tablet 1  ? DULoxetine (CYMBALTA) 30 MG capsule Take by mouth.    ? gabapentin (NEURONTIN) 300 MG capsule 1 tab by mouth in the evening for 1 week, then 1 tab twice a day for 1 week, then 1 tab three times a day    ? HYDROcodone-acetaminophen (NORCO/VICODIN) 5-325 MG tablet Take 1 tablet by mouth every 6 (six) hours as needed for up to 12 doses. 12 tablet 0  ? losartan-hydrochlorothiazide (HYZAAR) 100-25 MG tablet Take 1 tablet by mouth daily. 90 tablet 3  ? meloxicam (MOBIC) 15 MG tablet Take 15 mg by mouth daily.    ? methylPREDNISolone (MEDROL DOSEPAK) 4 MG TBPK tablet TAKE 6 TABLETS ON DAY 1 AS DIRECTED ON PACKAGE AND DECREASE BY 1 TAB EACH DAY FOR A TOTAL OF 6 DAYS    ? pantoprazole (PROTONIX) 40 MG tablet Take 1 tablet (40 mg total) by mouth daily. 90 tablet 3  ? sitaGLIPtin-metformin (JANUMET) 50-1000 MG tablet Take 1 tablet by mouth 2 (two) times daily with a meal. 180 tablet 1  ? tiZANidine (ZANAFLEX) 2 MG tablet Take 1 tablet (2 mg total) by mouth every 6 (six) hours as needed for muscle spasms. 60 tablet 1  ? busPIRone (BUSPAR) 5 MG tablet Take 1 tablet (5 mg total) by mouth 2 (two) times daily. 60 tablet 3  ? ?No facility-administered medications prior to visit.  ? ?Final Medications at End of Visit   ? ?Current Meds  ?Medication Sig  ? amLODipine (NORVASC) 10 MG tablet Take 1 tablet (10 mg total) by mouth daily.  ? DULoxetine (CYMBALTA) 30 MG capsule Take by mouth.  ? gabapentin (NEURONTIN) 300 MG capsule 1 tab by mouth in the evening for 1 week, then 1 tab twice a day for 1 week, then 1 tab three times a day  ? HYDROcodone-acetaminophen (NORCO/VICODIN) 5-325 MG tablet Take 1 tablet by mouth every 6 (six) hours as needed for up to 12 doses.  ? losartan-hydrochlorothiazide (HYZAAR) 100-25 MG tablet Take 1 tablet by  mouth daily.  ? meloxicam (MOBIC) 15 MG tablet Take 15 mg by mouth daily.  ? methylPREDNISolone (MEDROL DOSEPAK) 4 MG TBPK tablet TAKE 6 TABLETS ON DAY 1 AS DIRECTED ON PACKAGE AND DECREASE BY 1 TAB EACH D

## 2021-10-13 ENCOUNTER — Telehealth (INDEPENDENT_AMBULATORY_CARE_PROVIDER_SITE_OTHER): Payer: Self-pay | Admitting: Primary Care

## 2021-10-13 NOTE — Telephone Encounter (Signed)
Please see triage note and advise  

## 2021-10-13 NOTE — Telephone Encounter (Signed)
Attempted to call patient to discuss her medication side effects- left message will forward call to PCP for review and possible alternative as requested  ?

## 2021-10-13 NOTE — Telephone Encounter (Unsigned)
Copied from [place] [id number]. Topic: General - Other ?>> Oct 13, 2021  9:28 AM Tessa Lerner A wrote: ?Reason for CRM: The patient has called to share that their Metformin has made them feel nauseous ? ?The patient shares that they began to experience nausea, diarrhea and stomach discomfort when taking the medication  ? ?The patient last took the medication on Monday 10/09/21 ? ?The patient shares that they feel much better now that they've stopped taking the medication  ? ?Please contact to discuss further when possible ? ?The patient would like to be prescribed an alternative ?

## 2021-10-16 ENCOUNTER — Other Ambulatory Visit (HOSPITAL_COMMUNITY): Payer: Self-pay

## 2021-10-18 ENCOUNTER — Other Ambulatory Visit: Payer: Self-pay

## 2021-10-18 ENCOUNTER — Encounter (HOSPITAL_COMMUNITY): Payer: Self-pay | Admitting: Emergency Medicine

## 2021-10-18 ENCOUNTER — Emergency Department (HOSPITAL_COMMUNITY)
Admission: EM | Admit: 2021-10-18 | Discharge: 2021-10-18 | Disposition: A | Discharge status: Home / Self Care | Payer: Medicare Other | Attending: Emergency Medicine | Admitting: Emergency Medicine

## 2021-10-18 DIAGNOSIS — I10 Essential (primary) hypertension: Secondary | ICD-10-CM | POA: Insufficient documentation

## 2021-10-18 DIAGNOSIS — Z79899 Other long term (current) drug therapy: Secondary | ICD-10-CM | POA: Insufficient documentation

## 2021-10-18 LAB — CBC WITH DIFFERENTIAL/PLATELET
Abs Immature Granulocytes: 0.06 10*3/uL (ref 0.00–0.07)
Basophils Absolute: 0.1 10*3/uL (ref 0.0–0.1)
Basophils Relative: 1 %
Eosinophils Absolute: 0.2 10*3/uL (ref 0.0–0.5)
Eosinophils Relative: 2 %
HCT: 37 % (ref 36.0–46.0)
Hemoglobin: 11.8 g/dL — ABNORMAL LOW (ref 12.0–15.0)
Immature Granulocytes: 1 %
Lymphocytes Relative: 29 %
Lymphs Abs: 3.4 10*3/uL (ref 0.7–4.0)
MCH: 27.5 pg (ref 26.0–34.0)
MCHC: 31.9 g/dL (ref 30.0–36.0)
MCV: 86.2 fL (ref 80.0–100.0)
Monocytes Absolute: 0.6 10*3/uL (ref 0.1–1.0)
Monocytes Relative: 5 %
Neutro Abs: 7.4 10*3/uL (ref 1.7–7.7)
Neutrophils Relative %: 62 %
Platelets: 321 10*3/uL (ref 150–400)
RBC: 4.29 MIL/uL (ref 3.87–5.11)
RDW: 13.1 % (ref 11.5–15.5)
WBC: 11.7 10*3/uL — ABNORMAL HIGH (ref 4.0–10.5)
nRBC: 0 % (ref 0.0–0.2)

## 2021-10-18 LAB — COMPREHENSIVE METABOLIC PANEL
ALT: 14 U/L (ref 0–44)
AST: 19 U/L (ref 15–41)
Albumin: 4.2 g/dL (ref 3.5–5.0)
Alkaline Phosphatase: 75 U/L (ref 38–126)
Anion gap: 8 (ref 5–15)
BUN: 18 mg/dL (ref 8–23)
CO2: 27 mmol/L (ref 22–32)
Calcium: 9.4 mg/dL (ref 8.9–10.3)
Chloride: 107 mmol/L (ref 98–111)
Creatinine, Ser: 0.72 mg/dL (ref 0.44–1.00)
GFR, Estimated: >60 mL/min (ref >60)
Glucose, Bld: 118 mg/dL — ABNORMAL HIGH (ref 70–99)
Potassium: 3.7 mmol/L (ref 3.5–5.1)
Sodium: 142 mmol/L (ref 135–145)
Total Bilirubin: 0.5 mg/dL (ref 0.3–1.2)
Total Protein: 7.3 g/dL (ref 6.5–8.1)

## 2021-10-18 NOTE — ED Provider Notes (Signed)
?[facility] DEPT ?Provider Note ? ? ?CSN: [id number] ?Arrival date & time: 10/18/21  1920 ? ?  ? ?History ? ?Chief Complaint  ?Patient presents with  ? Hypertension  ? ? ?Icelyn D Gierke is a 66 y.o. female. ? ?66 year old female with prior medical history as detailed below presents for evaluation.  Patient with longstanding history of hypertension. ? ?Patient is on multiple medications for control of her blood pressure.  She is compliant with all.  She reports that earlier today she had a blood pressure that was greater than 200/120. ? ?She denies significant symptoms associated with same. ? ?She denies fever, headache, vision change, focal weakness.  Denies chest pain or shortness of breath.   ? ?The history is provided by the patient and medical records.  ?Hypertension ?This is a new problem. The problem occurs daily. The problem has been gradually improving. Pertinent negatives include no chest pain, no abdominal pain, no headaches and no shortness of breath. Nothing aggravates the symptoms. Nothing relieves the symptoms.  ? ?  ? ?Home Medications ?Prior to Admission medications   ?Medication Sig Start Date End Date Taking? Authorizing Provider  ?amLODipine (NORVASC) 10 MG tablet Take 1 tablet (10 mg total) by mouth daily. 05/15/21   [name], NP  ?DULoxetine (CYMBALTA) 30 MG capsule Take by mouth. 07/12/21   [provider]  ?gabapentin (NEURONTIN) 300 MG capsule 1 tab by mouth in the evening for 1 week, then 1 tab twice a day for 1 week, then 1 tab three times a day 06/14/21   [provider]  ?HYDROcodone-acetaminophen (NORCO/VICODIN) 5-325 MG tablet Take 1 tablet by mouth every 6 (six) hours as needed for up to 12 doses. 07/11/21   Evelina Bucy, DPM  ?losartan-hydrochlorothiazide (HYZAAR) 100-25 MG tablet Take 1 tablet by mouth daily. 05/15/21   [name], NP  ?meloxicam (MOBIC) 15 MG tablet Take 15 mg by mouth daily. 07/12/21   [provider]  ?methylPREDNISolone (MEDROL DOSEPAK) 4 MG TBPK tablet TAKE 6 TABLETS ON DAY 1 AS DIRECTED ON PACKAGE AND DECREASE BY 1 TAB EACH DAY FOR A TOTAL OF 6 DAYS 07/12/21   [provider]  ?metoprolol succinate (TOPROL XL) 25 MG 24 hr tablet Take 1 tablet (25 mg total) by mouth daily. 10/10/21   Cantwell, Celeste C, PA-C  ?pantoprazole (PROTONIX) 40 MG tablet Take 1 tablet (40 mg total) by mouth daily. 10/27/20   Ladene Artist, MD  ?sitaGLIPtin-metformin (JANUMET) 50-1000 MG tablet Take 1 tablet by mouth 2 (two) times daily with a meal. 08/17/21   [name], NP  ?tiZANidine (ZANAFLEX) 2 MG tablet Take 1 tablet (2 mg total) by mouth every 6 (six) hours as needed for muscle spasms. 11/11/20   [name], NP  ?   ? ?Allergies    ?Patient has no known allergies.   ? ?Review of Systems   ?Review of Systems  ?Respiratory:  Negative for shortness of breath.   ?Cardiovascular:  Negative for chest pain.  ?Gastrointestinal:  Negative for abdominal pain.  ?Neurological:  Negative for headaches.  ?All other systems reviewed and are negative. ? ?Physical Exam ?Updated Vital Signs ?BP (!) 188/110 (BP Location: Left Arm)   Pulse 79   Temp 98.2 ?F (36.8 ?C) (Oral)   Resp 18   Ht '5\' 2"'$  (1.575 m)   Wt 72.6 kg   SpO2 100%   BMI 29.26 kg/m?  ?Physical Exam ?Vitals and nursing note  reviewed.  ?Constitutional:   ?   General: She is not in acute distress. ?   Appearance: Normal appearance. She is well-developed.  ?HENT:  ?   Head: Normocephalic and atraumatic.  ?Eyes:  ?   Conjunctiva/sclera: Conjunctivae normal.  ?   Pupils: Pupils are equal, round, and reactive to light.  ?Cardiovascular:  ?   Rate and Rhythm: Normal rate and regular rhythm.  ?   Heart sounds: Normal heart sounds.  ?Pulmonary:  ?   Effort: Pulmonary effort is normal. No respiratory distress.  ?   Breath sounds: Normal breath sounds.  ?Abdominal:  ?   General: There is no distension.  ?   Palpations: Abdomen is soft.  ?    Tenderness: There is no abdominal tenderness.  ?Musculoskeletal:     ?   General: No deformity. Normal range of motion.  ?   Cervical back: Normal range of motion and neck supple.  ?Skin: ?   General: Skin is warm and dry.  ?Neurological:  ?   General: No focal deficit present.  ?   Mental Status: She is alert and oriented to person, place, and time.  ? ? ?ED Results / Procedures / Treatments   ?Labs ?(all labs ordered are listed, but only abnormal results are displayed) ?Labs Reviewed  ?CBC WITH DIFFERENTIAL/PLATELET - Abnormal; Notable for the following components:  ?    Result Value  ? WBC 11.7 (*)   ? Hemoglobin 11.8 (*)   ? All other components within normal limits  ?COMPREHENSIVE METABOLIC PANEL - Abnormal; Notable for the following components:  ? Glucose, Bld 118 (*)   ? All other components within normal limits  ? ? ?EKG ?None ? ?Radiology ?No results found. ? ?Procedures ?Procedures  ? ? ?Medications Ordered in ED ?Medications - No data to display ? ?ED Course/ Medical Decision Making/ A&P ?  ?                        ?Medical Decision Making ?Amount and/or Complexity of Data Reviewed ?Labs: ordered. ? ? ? ?Medical Screen Complete ? ?This patient presented to the ED with complaint of hypertension. ? ?This complaint involves an extensive number of treatment options. The initial differential diagnosis includes, but is not limited to, chronic hypertension ? ?This presentation is: Chronic, Self-Limited, Previously Undiagnosed, Uncertain Prognosis, Complicated, Systemic Symptoms, and Threat to Life/Bodily Function ? ?Patient with longstanding history of hypertension.  She presents now with concerns over elevated blood pressure today. ? ?Baseline BP appears to be near 176 systolic per report.  ? ?She is without evidence of endorgan damage. ? ?Patient is appropriate for outpatient management.  She has already established outpatient team working to control her blood pressure. ? ?Baseline blood pressure appears to be  approximately in the 170s per her report.  ? ?Strict return precautions given and understood.  Importance of close follow-up is stressed.     ? ? ? ?Co morbidities that complicated the patient's evaluation ? ?History of hypertension ? ? ?Additional history obtained: ? ?External records from outside sources obtained and reviewed including prior ED visits and prior Inpatient records.  ? ? ?Lab Tests: ? ?I ordered and personally interpreted labs.  The pertinent results include: CBC, CMP ?Problem List / ED Course: ? ?Hypertension ? ? ?Reevaluation: ? ?After the interventions noted above, I reevaluated the patient and found that they have: improved ? ? ?Disposition: ? ?After consideration of the diagnostic results and the  patients response to treatment, I feel that the patent would benefit from close outpatient followup.  ? ? ? ? ? ? ? ? ?Final Clinical Impression(s) / ED Diagnoses ?Final diagnoses:  ?Hypertension, unspecified type  ? ? ?Rx / DC Orders ?ED Discharge Orders   ? ? None  ? ?  ? ? ?  ?Valarie Merino, MD ?10/18/21 2243 ? ?

## 2021-10-18 NOTE — ED Notes (Signed)
Sent blue tube to lab. ?

## 2021-10-18 NOTE — ED Triage Notes (Signed)
Pt presents c/o increased bp readings x 2 weeks. Pt states that she has been having issues keeping blood pressure controlled even with medication compliance. When asked what was the reason for ED visit today pt states that her blood pressure highest reading today was 219/120 and she is experiencing associated emesis x3, headache with neck and shoulder pain, and overall fatigue.  ?

## 2021-10-18 NOTE — Discharge Instructions (Signed)
Return for any problem.  ?

## 2021-10-19 NOTE — Telephone Encounter (Signed)
Called patient to explained her tiredness and elevated Bp is from working and caring for her mother and her body is tired and this same information was stated by other physicians.    ?

## 2021-10-24 ENCOUNTER — Other Ambulatory Visit (HOSPITAL_COMMUNITY): Payer: Self-pay

## 2021-10-25 ENCOUNTER — Ambulatory Visit: Payer: Medicare Other

## 2021-10-25 DIAGNOSIS — I10 Essential (primary) hypertension: Secondary | ICD-10-CM

## 2021-11-02 ENCOUNTER — Other Ambulatory Visit (HOSPITAL_COMMUNITY): Payer: Self-pay

## 2021-11-02 ENCOUNTER — Other Ambulatory Visit: Payer: Self-pay | Admitting: Gastroenterology

## 2021-11-02 MED ORDER — PANTOPRAZOLE SODIUM 40 MG PO TBEC
40.0000 mg | DELAYED_RELEASE_TABLET | Freq: Every day | ORAL | 0 refills | Status: DC
  Filled 2021-11-02: qty 90, 90d supply, fill #0

## 2021-11-14 ENCOUNTER — Ambulatory Visit (INDEPENDENT_AMBULATORY_CARE_PROVIDER_SITE_OTHER): Payer: Medicare Other | Admitting: Primary Care

## 2021-11-14 ENCOUNTER — Encounter (INDEPENDENT_AMBULATORY_CARE_PROVIDER_SITE_OTHER): Payer: Self-pay | Admitting: Primary Care

## 2021-11-14 VITALS — BP 138/82 | HR 83 | Temp 98.1°F | Ht 62.0 in | Wt 111.2 lb

## 2021-11-14 DIAGNOSIS — H8110 Benign paroxysmal vertigo, unspecified ear: Secondary | ICD-10-CM | POA: Diagnosis not present

## 2021-11-14 DIAGNOSIS — E119 Type 2 diabetes mellitus without complications: Secondary | ICD-10-CM | POA: Diagnosis not present

## 2021-11-14 DIAGNOSIS — I10 Essential (primary) hypertension: Secondary | ICD-10-CM | POA: Diagnosis not present

## 2021-11-14 DIAGNOSIS — Q845 Enlarged and hypertrophic nails: Secondary | ICD-10-CM

## 2021-11-14 DIAGNOSIS — F4323 Adjustment disorder with mixed anxiety and depressed mood: Secondary | ICD-10-CM | POA: Diagnosis not present

## 2021-11-14 LAB — POCT GLYCOSYLATED HEMOGLOBIN (HGB A1C): Hemoglobin A1C: 7.1 % — AB (ref 4.0–5.6)

## 2021-11-14 MED ORDER — SITAGLIPTIN PHOSPHATE 25 MG PO TABS: 25.0000 mg | ORAL_TABLET | Freq: Every day | ORAL | 1 refills | Status: DC

## 2021-11-14 MED ORDER — DULOXETINE HCL 30 MG PO CPEP
30.0000 mg | ORAL_CAPSULE | Freq: Every day | ORAL | 1 refills | Status: DC
Start: 2021-11-14 — End: 2022-02-16

## 2021-11-14 MED ORDER — MECLIZINE HCL 12.5 MG PO TABS
12.5000 mg | ORAL_TABLET | Freq: Three times a day (TID) | ORAL | 1 refills | Status: DC | PRN
Start: 2021-11-14 — End: 2022-12-03

## 2021-11-14 NOTE — Patient Instructions (Addendum)
Duloxetine Delayed-Release Capsules What is this medication? DULOXETINE (doo LOX e teen) treats depression, anxiety, fibromyalgia, and certain types of chronic pain such as nerve, bone, or joint pain. It increases the amount of serotonin and norepinephrine in the brain, hormones that help regulate mood and pain. It belongs to a group of medications called SNRIs. This medicine may be used for other purposes; ask your health care provider or pharmacist if you have questions. COMMON BRAND NAME(S): Cymbalta, Drizalma, Irenka What should I tell my care team before I take this medication? They need to know if you have any of these conditions: Bipolar disorder Glaucoma High blood pressure Kidney disease Liver disease Seizures Suicidal thoughts, plans or attempt; a previous suicide attempt by you or a family member Take medications that treat or prevent blood clots Taken medications called MAOIs like Carbex, Eldepryl, Marplan, Nardil, and Parnate within 14 days Trouble passing urine An unusual reaction to duloxetine, other medications, foods, dyes, or preservatives Pregnant or trying to get pregnant Breast-feeding How should I use this medication? Take this medication by mouth with a glass of water. Follow the directions on the prescription label. Do not crush, cut or chew some capsules of this medication. Some capsules may be opened and sprinkled on applesauce. Check with your care team or pharmacist if you are not sure. You can take this medication with or without food. Take your medication at regular intervals. Do not take your medication more often than directed. Do not stop taking this medication suddenly except upon the advice of your care team. Stopping this medication too quickly may cause serious side effects or your condition may worsen. A special MedGuide will be given to you by the pharmacist with each prescription and refill. Be sure to read this information carefully each time. Talk to  your care team regarding the use of this medication in children. While this medication may be prescribed for children as young as 7 years of age for selected conditions, precautions do apply. Overdosage: If you think you have taken too much of this medicine contact a poison control center or emergency room at once. NOTE: This medicine is only for you. Do not share this medicine with others. What if I miss a dose? If you miss a dose, take it as soon as you can. If it is almost time for your next dose, take only that dose. Do not take double or extra doses. What may interact with this medication? Do not take this medication with any of the following: Desvenlafaxine Levomilnacipran Linezolid MAOIs like Carbex, Eldepryl, Emsam, Marplan, Nardil, and Parnate Methylene blue (injected into a vein) Milnacipran Safinamide Thioridazine Venlafaxine Viloxazine This medication may also interact with the following: Alcohol Amphetamines Aspirin and aspirin-like medications Certain antibiotics like ciprofloxacin and enoxacin Certain medications for blood pressure, heart disease, irregular heart beat Certain medications for depression, anxiety, or psychotic disturbances Certain medications for migraine headache like almotriptan, eletriptan, frovatriptan, naratriptan, rizatriptan, sumatriptan, zolmitriptan Certain medications that treat or prevent blood clots like warfarin, enoxaparin, and dalteparin Cimetidine Fentanyl Lithium NSAIDS, medications for pain and inflammation, like ibuprofen or naproxen Phentermine Procarbazine Rasagiline Sibutramine St. John's wort Theophylline Tramadol Tryptophan This list may not describe all possible interactions. Give your health care provider a list of all the medicines, herbs, non-prescription drugs, or dietary supplements you use. Also tell them if you smoke, drink alcohol, or use illegal drugs. Some items may interact with your medicine. What should I watch  for while using this medication? Tell your   care team if your symptoms do not get better or if they get worse. Visit your care team for regular checks on your progress. Because it may take several weeks to see the full effects of this medication, it is important to continue your treatment as prescribed by your care team. This medication may cause serious skin reactions. They can happen weeks to months after starting the medication. Contact your care team right away if you notice fevers or flu-like symptoms with a rash. The rash may be red or purple and then turn into blisters or peeling of the skin. Or, you might notice a red rash with swelling of the face, lips, or lymph nodes in your neck or under your arms. Watch for new or worsening thoughts of suicide or depression. This includes sudden changes in mood, behaviors, or thoughts. These changes can happen at any time but are more common in the beginning of treatment or after a change in dose. Call your care team right away if you experience these thoughts or worsening depression. Manic episodes may happen in patients with bipolar disorder who take this medication. Watch for changes in feelings or behaviors such as feeling anxious, nervous, agitated, panicky, irritable, hostile, aggressive, impulsive, severely restless, overly excited and hyperactive, or trouble sleeping. These symptoms can happen at any time, but are more common in the beginning of treatment or after a change in dose. Call your care team right away if you notice any of these symptoms. You may get drowsy or dizzy. Do not drive, use machinery, or do anything that needs mental alertness until you know how this medication affects you. Do not stand or sit up quickly, especially if you are an older patient. This reduces the risk of dizzy or fainting spells. Alcohol may interfere with the effect of this medication. Avoid alcoholic drinks. This medication may increase blood sugar. The risk may be  higher in patients who already have diabetes. Ask your care team what you can do to lower your risk of diabetes while taking this medication. This medication can cause an increase in blood pressure. This medication can also cause a sudden drop in your blood pressure, which may make you feel faint and increase the chance of a fall. These effects are most common when you first start the medication or when the dose is increased, or during use of other medications that can cause a sudden drop in blood pressure. Check with your care team for instructions on monitoring your blood pressure while taking this medication. Your mouth may get dry. Chewing sugarless gum or sucking hard candy, and drinking plenty of water, may help. Contact your care team if the problem does not go away or is severe. What side effects may I notice from receiving this medication? Side effects that you should report to your care team as soon as possible: Allergic reactions--skin rash, itching, hives, swelling of the face, lips, tongue, or throat Bleeding--bloody or black, tar-like stools, red or dark brown urine, vomiting blood or brown material that looks like coffee grounds, small, red or purple spots on skin, unusual bleeding or bruising Increase in blood pressure Liver injury--right upper belly pain, loss of appetite, nausea, light-colored stool, dark yellow or brown urine, yellowing skin or eyes, unusual weakness or fatigue Low sodium level--muscle weakness, fatigue, dizziness, headache, confusion Redness, blistering, peeling, or loosening of the skin, including inside the mouth Serotonin syndrome--irritability, confusion, fast or irregular heartbeat, muscle stiffness, twitching muscles, sweating, high fever, seizures, chills, vomiting, diarrhea   Sudden eye pain or change in vision such as blurry vision, seeing halos around lights, vision loss Thoughts of suicide or self-harm, worsening mood, feelings of depression Trouble passing  urine Side effects that usually do not require medical attention (report to your care team if they continue or are bothersome): Change in sex drive or performance Constipation Diarrhea Dizziness Dry mouth Excessive sweating Loss of appetite Nausea Vomiting This list may not describe all possible side effects. Call your doctor for medical advice about side effects. You may report side effects to FDA at 1-800-FDA-1088. Where should I keep my medication? Keep out of the reach of children and pets. Store at room temperature between 15 and 30 degrees C (59 to 86 degrees F). Get rid of any unused medication after the expiration date. To get rid of medications that are no longer needed or have expired: Take the medication to a medication take-back program. Check with your pharmacy or law enforcement to find a location. If you cannot return the medication, check the label or package insert to see if the medication should be thrown out in the garbage or flushed down the toilet. If you are not sure, ask your care team. If it is safe to put it in the trash, take the medication out of the container. Mix the medication with cat litter, dirt, coffee grounds, or other unwanted substance. Seal the mixture in a bag or container. Put it in the trash. NOTE: This sheet is a summary. It may not cover all possible information. If you have questions about this medicine, talk to your doctor, pharmacist, or health care provider.  2023 Elsevier/Gold Standard (2020-06-21 00:00:00) Orthostatic Hypotension Blood pressure is a measurement of how strongly, or weakly, your circulating blood is pressing against the walls of your arteries. Orthostatic hypotension is a drop in blood pressure that can happen when you change positions, such as when you go from lying down to standing. Arteries are blood vessels that carry blood from your heart throughout your body. When blood pressure is too low, you may not get enough blood to  your brain or to the rest of your organs. Orthostatic hypotension can cause light-headedness, sweating, rapid heartbeat, blurred vision, and fainting. These symptoms require further investigation into the cause. What are the causes? Orthostatic hypotension can be caused by many things, including: Sudden changes in posture, such as standing up quickly after you have been sitting or lying down. Loss of blood (anemia) or loss of body fluids (dehydration). Heart problems, neurologic problems, or hormone problems. Pregnancy. Aging. The risk for this condition increases as you get older. Severe infection (sepsis). Certain medicines, such as medicines for high blood pressure or medicines that make the body lose excess fluids (diuretics). What are the signs or symptoms? Symptoms of this condition may include: Weakness, light-headedness, or dizziness. Sweating. Blurred vision. Tiredness (fatigue). Rapid heartbeat. Fainting, in severe cases. How is this diagnosed? This condition is diagnosed based on: Your symptoms and medical history. Your blood pressure measurements. Your health care provider will check your blood pressure when you are: Lying down. Sitting. Standing. A blood pressure reading is recorded as two numbers, such as "120 over 80" (or 120/80). The first ("top") number is called the systolic pressure. It is a measure of the pressure in your arteries as your heart beats. The second ("bottom") number is called the diastolic pressure. It is a measure of the pressure in your arteries when your heart relaxes between beats. Blood pressure is measured in  a unit called mmHg. Healthy blood pressure for most adults is 120/80 mmHg. Orthostatic hypotension is defined as a 20 mmHg drop in systolic pressure or a 10 mmHg drop in diastolic pressure within 3 minutes of standing. Other information or tests that may be used to diagnose orthostatic hypotension include: Your other vital signs, such as your  heart rate and temperature. Blood tests. An electrocardiogram (ECG) or echocardiogram. A Holter monitor. This is a device you wear that records your heart rhythm continuously, usually for 24-48 hours. Tilt table test. For this test, you will be safely secured to a table that moves you from a lying position to an upright position. Your heart rhythm and blood pressure will be monitored during the test. How is this treated? This condition may be treated by: Changing your diet. This may involve eating more salt (sodium) or drinking more water. Changing the dosage of certain medicines you are taking that might be lowering your blood pressure. Correcting the underlying reason for the orthostatic hypotension. Wearing compression stockings. Taking medicines to raise your blood pressure. Avoiding actions that trigger symptoms. Follow these instructions at home: Medicines Take over-the-counter and prescription medicines only as told by your health care provider. Follow instructions from your health care provider about changing the dosage of your current medicines, if this applies. Do not stop or adjust any of your medicines on your own. Eating and drinking  Drink enough fluid to keep your urine pale yellow. Eat extra salt only as directed. Do not add extra salt to your diet unless advised by your health care provider. Eat frequent, small meals. Avoid standing up suddenly after eating. General instructions  Get up slowly from lying down or sitting positions. This gives your blood pressure a chance to adjust. Avoid hot showers and excessive heat as directed by your health care provider. Engage in regular physical activity as directed by your health care provider. If you have compression stockings, wear them as told. Keep all follow-up visits. This is important. Contact a health care provider if: You have a fever for more than 2-3 days. You feel more thirsty than usual. You feel dizzy or  weak. Get help right away if: You have chest pain. You have a fast or irregular heartbeat. You become sweaty or feel light-headed. You feel short of breath. You faint. You have any symptoms of a stroke. "BE FAST" is an easy way to remember the main warning signs of a stroke: B - Balance. Signs are dizziness, sudden trouble walking, or loss of balance. E - Eyes. Signs are trouble seeing or a sudden change in vision. F - Face. Signs are sudden weakness or numbness of the face, or the face or eyelid drooping on one side. A - Arms. Signs are weakness or numbness in an arm. This happens suddenly and usually on one side of the body. S - Speech. Signs are sudden trouble speaking, slurred speech, or trouble understanding what people say. T - Time. Time to call emergency services. Write down what time symptoms started. You have other signs of a stroke, such as: A sudden, severe headache with no known cause. Nausea or vomiting. Seizure. These symptoms may represent a serious problem that is an emergency. Do not wait to see if the symptoms will go away. Get medical help right away. Call your local emergency services (911 in the [place] Do not drive yourself to the hospital. Summary Orthostatic hypotension is a sudden drop in blood pressure. It can cause light-headedness, sweating,  rapid heartbeat, blurred vision, and fainting. Orthostatic hypotension can be diagnosed by having your blood pressure taken while lying down, sitting, and then standing. Treatment may involve changing your diet, wearing compression stockings, sitting up slowly, adjusting your medicines, or correcting the underlying reason for the orthostatic hypotension. Get help right away if you have chest pain, a fast or irregular heartbeat, or symptoms of a stroke. This information is not intended to replace advice given to you by your health care provider. Make sure you discuss any questions you have with your health care  provider. Document Revised: 08/25/2020 Document Reviewed: 08/25/2020 Elsevier Patient Education  [place].

## 2021-11-14 NOTE — Progress Notes (Signed)
[name], is a 66 y.o. female  FKC:[id number]  VCB:[id number]  DOB - April 11, 1956  Chief Complaint  Patient presents with   Diabetes       Subjective:   [name] is a 66 y.o. female here today for a follow up visit for hypertension and type 2 diabetes.  She informed me today that she was not taking her Janumet because it was giving her a side effect of dizziness nausea and GI problems.  She has not been on medication for 3 to 4 weeks.  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  PCP tried to convey this information to her and she dismissed it.  After hearing the exact same information from the emergency room which she was frequently going to and the cardiologist she realized someone had to be right.  She has now decreased her hours for working, trying to take time for herself and seeking help to take care of her mother.  Today is the best her blood pressure has been in the last 3 to 4  months.  Patient has No headache, No chest pain, No abdominal pain - No Nausea, No new weakness tingling or numbness, No Cough - shortness of breath This SmartLink has not been configured with any valid records.   No problems updated.  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 90 tablet 1   gabapentin (NEURONTIN) 300 MG capsule 1 tab by mouth in the evening for 1 week, then 1 tab twice a day for 1 week, then 1 tab three times a day     losartan-hydrochlorothiazide (HYZAAR) 100-25 MG tablet Take 1 tablet by mouth daily. 90 tablet 3   meloxicam (MOBIC) 15 MG tablet Take 15 mg by mouth daily.     metoprolol succinate (TOPROL XL) 25 MG 24 hr tablet Take 1 tablet (25 mg total) by mouth daily. 30 tablet 3   pantoprazole (PROTONIX) 40 MG tablet Take 1 tablet (40 mg total) by mouth daily. 90 tablet 0   tiZANidine (ZANAFLEX) 2 MG tablet Take 1 tablet (2 mg total) by mouth every 6 (six) hours as needed for muscle spasms. (Patient not taking: Reported on 11/14/2021) 60 tablet 1   No current facility-administered medications on file prior to visit.    Objective:   Vitals:   11/14/21 0929  BP: 138/82  Pulse: 83  Temp: 98.1 F (36.7 C)  TempSrc: Oral  SpO2: 100%  Weight: 111 lb 3.2 oz (50.4 kg)  Height: '5\' 2"'$  (1.575 m)    Exam General appearance : Awake, alert, not in any distress. Speech Clear. Not toxic looking HEENT: Atraumatic and Normocephalic, pupils equally reactive  to light and accomodation Neck: Supple, no JVD. No cervical lymphadenopathy.  Chest: Good air entry bilaterally, no added sounds  CVS: S1 S2 regular, no murmurs.  Abdomen: Bowel sounds present, Non tender and not distended with no gaurding, rigidity or rebound. Extremities: B/L Lower Ext shows no edema, both legs are warm to touch (callous left big toe) Neurology: Awake alert, and oriented X 3, , Non focal Skin: No Rash  Data Review Lab Results  Component Value Date   HGBA1C 7.1 (A) 11/14/2021   HGBA1C 7.9 (A) 08/17/2021   HGBA1C 6.8 (A) 05/15/2021    Assessment & Plan   The key to the diagnosis is to determine whether dizziness can be provoked by maneuvers that change head position without lowering blood pressure or decreasing cerebral blood flow. Such maneuvers  include lying down, rolling over in bed, and bending the neck back to look up. Dizziness in these settings suggests positional vertigo, not postural presyncope.   [name] was seen today for diabetes.  Diagnoses and all orders for this visit:  Type 2 diabetes mellitus without complication, without long-term current use of insulin (HCC) -     HgB A1c 7.1 improved from 7.9 3 months ago Continue monitoring foods  that are high in carbohydrates are the following rice, potatoes, breads, sugars, and pastas.  Reduction in the intake (eating) will assist in lowering your blood sugars.  -     sitaGLIPtin (JANUVIA) 25 MG tablet; Take 1 tablet (25 mg total) by mouth daily.  Essential hypertension We have discussed target BP range and blood pressure goal. I have advised patient to check BP regularly and to call us back or report to clinic if the numbers are consistently higher than 140/90. We discussed the importance of compliance with medical therapy and DASH diet recommended, consequences of uncontrolled hypertension discussed.  - continue current BP medications   Adjustment disorder with mixed anxiety and depressed mood -     DULoxetine (CYMBALTA) 30 MG capsule; Take 1 capsule (30 mg total) by mouth daily.  Benign paroxysmal positional vertigo, unspecified laterality -     meclizine (ANTIVERT) 12.5 MG tablet; Take 1 tablet (12.5 mg total) by mouth 3 (three) times daily as needed for dizziness.  Enlarged and hypertrophic nails -     Ambulatory referral to Podiatry     Patient have been counseled extensively about nutrition and exercise. Other issues discussed during this visit include: low cholesterol diet, weight control and daily exercise, foot care, annual eye examinations at Ophthalmology, importance of adherence with medications and regular follow-up. We also discussed long term complications of uncontrolled diabetes and hypertension.   Return in about 3 months (around 02/14/2022) for medical  conditions.  The patient was given clear instructions to go to ER or return to medical center if symptoms don't improve, worsen or new problems develop. The patient verbalized understanding. The patient was told to call to get lab results if they haven't heard anything in the next week.   This note has been created with Survey[employer], quantity. Any transcriptional errors are unintentional.   [name], NP 11/20/2021, 12:54 AM

## 2021-11-15 ENCOUNTER — Encounter: Payer: Self-pay | Admitting: Podiatry

## 2021-11-15 ENCOUNTER — Ambulatory Visit (INDEPENDENT_AMBULATORY_CARE_PROVIDER_SITE_OTHER): Payer: Medicare Other | Admitting: Podiatry

## 2021-11-15 DIAGNOSIS — M79675 Pain in left toe(s): Secondary | ICD-10-CM

## 2021-11-15 DIAGNOSIS — L84 Corns and callosities: Secondary | ICD-10-CM | POA: Diagnosis not present

## 2021-11-15 DIAGNOSIS — E119 Type 2 diabetes mellitus without complications: Secondary | ICD-10-CM

## 2021-11-15 DIAGNOSIS — M79674 Pain in right toe(s): Secondary | ICD-10-CM | POA: Diagnosis not present

## 2021-11-15 DIAGNOSIS — B351 Tinea unguium: Secondary | ICD-10-CM

## 2021-11-15 NOTE — Progress Notes (Signed)
  Subjective:  Patient ID: [name], female    DOB: 1956/06/02,   MRN: [id number]  Chief Complaint  Patient presents with   Nail Problem    Diabetic foot care      66 y.o. female presents for concern of thickened elongated and painful nails that are difficult to trim. Requesting to have them trimmed today. Relates burning and tingling in their feet. Relates hallux on right is still doing well. Also hoping to have callus trimmed as well. Patient is diabetic and last A1c was  Lab Results  Component Value Date   HGBA1C 7.1 (A) 11/14/2021   .   PCP:  [name], NP    . Denies any other pedal complaints. Denies n/v/f/c.   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 CFT <3 seconds. Absent hair growth on digits. Edema noted to bilateral lower extremities. Xerosis noted bilaterally.  Skin. No lacerations or abrasions bilateral feet. Nails 1-5 bilateral  are thickened discolored and elongated with subungual debris.  Musculoskeletal: MMT 5/5 bilateral lower extremities in DF, PF, Inversion and Eversion. Deceased ROM in DF of ankle joint. 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 Tender to hallux IPJ. No pain around MPJ or lesser digits.  Neurological: Sensation intact to light touch. Protective sensation diminished bilateral.    Assessment:   1. Diabetes mellitus type 2 in nonobese (HCC)   2. Pain due to onychomycosis of toenails of both feet   3. Corns and callosities      Plan:  Patient was evaluated and treated and all questions answered. -Discussed and educated patient on diabetic foot care,  especially with  regards to the vascular, neurological and musculoskeletal systems.  -Stressed the importance of good glycemic control and the detriment of not  controlling glucose levels in relation to the foot. -Discussed supportive shoes at all times and checking feet regularly.  -Mechanically debrided all nails 1-5 bilateral using sterile nail nipper and filed with dremel without incident  -Hyperkeratotic tissue debrided without incident.  -Discussed again if not having pain in hammertoes then no need for surgery at this point.  -Answered all patient questions -Patient to return  in 3 months for at risk foot care -Patient advised to call the office if any problems or questions arise in the meantime.   Lorenda Peck, DPM

## 2021-11-16 NOTE — Progress Notes (Deleted)
Primary Physician/Referring:  [name], NP  Patient ID: [name], female    DOB: [birth year], 66 y.o.   MRN: [id number]  No chief complaint on file.  HPI:    [name]  is a 66 y.o. AA female with history of hypertension, hyperlipidemia, type 2 diabetes mellitus.  Patient reports her father died of an MI at an unknown age, but suspects prior to 67 years old.  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 Patient is referred to our office By PCP for evaluation and management of resistant hypertension.  Patient was last seen in the office 10/10/2021 at which time added Toprol-XL 25 mg p.o. daily given uncontrolled hypertension.  Ordered renal artery duplex, which revealed no evidence of renal artery stenosis.  Also last office visit given left atrial enlargement on EKG ordered echocardiogram which revealed LVEF 61% and mild mitral regurgitation, otherwise unremarkable.  Patient now presents for 6-week follow-up. ***  ***  Patient reports a longstanding history of hypertension, however that over the last 2 weeks blood pressure has been elevated above goal and she has been experiencing fatigue.  Patient works as a Programmer, applications and has noticed she gets tired much more quickly while at work.  Denies chest pain, dyspnea, dizziness, syncope, near syncope.  Denies symptoms suggestive of TIA or CVA.  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, on left side of brain    Arthritis    "all over" (06/19/2017)   Diabetes mellitus without complication ([place])    diet control, pt denies   Hyperlipidemia    Hypertension    TIA (transient ischemic attack)    pt unaware of this hx on 06/19/2017   Past Surgical History:  Procedure Laterality Date   ANTERIOR CERVICAL DECOMP/DISCECTOMY [place]   FOOT SURGERY Left 11/2019   IR GENERIC HISTORICAL  09/21/2014   IR ANGIO INTRA EXTRACRAN SEL INTERNAL CAROTID UNI L MOD SED 09/21/2014 [name], [facility]   IR GENERIC HISTORICAL  09/21/2014   IR ANGIO INTRA EXTRACRAN SEL COM CAROTID INNOMINATE UNI R MOD SED 09/21/2014 [name], [facility]   IR GENERIC HISTORICAL  09/21/2014   IR ANGIO VERTEBRAL SEL VERTEBRAL UNI L MOD SED 09/21/2014 [name], [facility]   IR GENERIC HISTORICAL  09/21/2014   IR 3D INDEPENDENT WKST 09/21/2014 [name], [facility]   LAPAROSCOPIC CHOLECYSTECTOMY     TUBAL LIGATION     Family History  Problem Relation Age of Onset   Hypertension Mother    Arthritis Mother    Diabetes Mother    Dementia Mother    Heart attack Father    Heart disease Father    Hypertension Sister    Diabetes Sister    Hypertension Sister    Heart disease Sister    Diabetes Sister    Hypertension Brother    Heart disease Brother    Diabetes Brother    Hypertension Brother    Hypertension Brother    Hypertension Brother    Cancer Neg Hx    Colon cancer Neg Hx    Colon polyps Neg Hx    Esophageal cancer Neg Hx    Rectal cancer Neg Hx  Stomach cancer Neg Hx     Social History   Tobacco Use   Smoking status: Never   Smokeless tobacco: Never  Substance Use Topics   Alcohol use: Yes    Alcohol/week: 3.0 standard drinks    Types: 3 Standard drinks or equivalent per week    Comment: occ   Marital Status: Widowed   ROS  Review of Systems  Constitutional: Positive for malaise/fatigue.  Cardiovascular:  Negative for chest pain, claudication, dyspnea on exertion, leg swelling, near-syncope, orthopnea, palpitations, paroxysmal nocturnal dyspnea and syncope.  Neurological:  Negative for dizziness.   Objective  There were no vitals taken for this visit.     11/14/2021    9:29 AM 10/18/2021   11:27 PM 10/18/2021    7:33 PM  Vitals with BMI  Height '5\' 2"'$   '5\' 2"'$    Weight 111 lbs 3 oz  160 lbs  BMI 25.05  39.76  Systolic 734 193 790  Diastolic 82 240 973  Pulse 83  79    Physical Exam Vitals reviewed.  Cardiovascular:     Rate and Rhythm: Normal rate and regular rhythm.     Pulses: Intact distal pulses.     Heart sounds: S1 normal and S2 normal. No murmur heard.   No gallop.  Pulmonary:     Effort: Pulmonary effort is normal. No respiratory distress.     Breath sounds: No wheezing, rhonchi or rales.  Musculoskeletal:     Right lower leg: No edema.     Left lower leg: No edema.  Neurological:     Mental Status: She is alert.   Laboratory examination:   Recent Labs    09/30/21 1200 10/03/21 1849 10/18/21 2019  NA 139 139 142  K 4.1 3.8 3.7  CL 105 107 107  CO2 '27 23 27  '$ GLUCOSE 120* 141* 118*  BUN 26* 21 18  CREATININE 0.73 0.93 0.72  CALCIUM 9.6 10.0 9.4  GFRNONAA >60 >60 >60    CrCl cannot be calculated (Patient's most recent lab result is older than the maximum 21 days allowed.).     Latest Ref Rng & Units 10/18/2021    8:19 PM 10/03/2021    6:49 PM 09/30/2021   12:00 PM  CMP  Glucose 70 - 99 mg/dL 118   141   120    BUN 8 - 23 mg/dL '18   21   26    '$ Creatinine 0.44 - 1.00 mg/dL 0.72   0.93   0.73    Sodium 135 - 145 mmol/L 142   139   139    Potassium 3.5 - 5.1 mmol/L 3.7   3.8   4.1    Chloride 98 - 111 mmol/L 107   107   105    CO2 22 - 32 mmol/L '27   23   27    '$ Calcium 8.9 - 10.3 mg/dL 9.4   10.0   9.6    Total Protein 6.5 - 8.1 g/dL 7.3      Total Bilirubin 0.3 - 1.2 mg/dL 0.5      Alkaline Phos 38 - 126 U/L 75      AST 15 - 41 U/L 19      ALT 0 - 44 U/L 14          Latest Ref Rng & Units 10/18/2021    8:19 PM 10/03/2021    6:49 PM 09/30/2021   12:00 PM  CBC  WBC 4.0 - 10.5 K/uL  11.7   11.1   9.3    Hemoglobin 12.0 - 15.0 g/dL 11.8   12.4   11.7    Hematocrit 36.0 - 46.0 % 37.0   37.7   36.8    Platelets 150 - 400 K/uL 321   318   176      Lipid Panel No results for input(s): CHOL, TRIG, LDLCALC, VLDL,  HDL, CHOLHDL, LDLDIRECT in the last 8760 hours.  HEMOGLOBIN A1C Lab Results  Component Value Date   HGBA1C 7.1 (A) 11/14/2021   MPG 140 10/09/2020   TSH No results for input(s): TSH in the last 8760 hours.  External labs:  None  Allergies  No Known Allergies   Medications Prior to Visit:   Outpatient Medications Prior to Visit  Medication Sig Dispense Refill   amLODipine (NORVASC) 10 MG tablet Take 1 tablet (10 mg total) by mouth daily. 90 tablet 1   DULoxetine (CYMBALTA) 30 MG capsule Take 1 capsule (30 mg total) by mouth daily. 90 capsule 1   gabapentin (NEURONTIN) 300 MG capsule 1 tab by mouth in the evening for 1 week, then 1 tab twice a day for 1 week, then 1 tab three times a day     losartan-hydrochlorothiazide (HYZAAR) 100-25 MG tablet Take 1 tablet by mouth daily. 90 tablet 3   meclizine (ANTIVERT) 12.5 MG tablet Take 1 tablet (12.5 mg total) by mouth 3 (three) times daily as needed for dizziness. 60 tablet 1   meloxicam (MOBIC) 15 MG tablet Take 15 mg by mouth daily.     metoprolol succinate (TOPROL XL) 25 MG 24 hr tablet Take 1 tablet (25 mg total) by mouth daily. 30 tablet 3   pantoprazole (PROTONIX) 40 MG tablet Take 1 tablet (40 mg total) by mouth daily. 90 tablet 0   sitaGLIPtin (JANUVIA) 25 MG tablet Take 1 tablet (25 mg total) by mouth daily. 90 tablet 1   tiZANidine (ZANAFLEX) 2 MG tablet Take 1 tablet (2 mg total) by mouth every 6 (six) hours as needed for muscle spasms. (Patient not taking: Reported on 11/14/2021) 60 tablet 1   No facility-administered medications prior to visit.   Final Medications at End of Visit    No outpatient medications have been marked as taking for the 11/21/21 encounter (Appointment) with Rayetta Pigg, Ramy Greth C, PA-C.   Radiology:   No results found.  Cardiac Studies:   Echocardiogram 10/25/2021:  Normal LV systolic function with EF 61%. Left ventricle cavity is normal  in size. Normal left ventricular wall thickness. Normal  global wall  motion. Normal diastolic filling pattern, normal LAP.  Mild (Grade I) mitral regurgitation.  Compared to 05/17/2018: G1DD is now normal otherwise no significant  change.  Renal artery duplex  10/25/2021:  No evidence of renal artery occlusive disease in either renal artery.  Normal intrarenal vascular perfusion is noted in both kidneys. Renal  length is within normal limits for both kidneys.  A simple cyst measuring 0.6(L) x 0.6(T) x 0.6(AP) cm is seen in the right  kidney.  EKG:   10/10/2021: Sinus rhythm at a rate of 96 bpm.  Atrial enlargement.  Left axis, left anterior fascicular block.  Nonspecific T wave abnormality.  No evidence of ischemia or underlying injury pattern.  Assessment   No diagnosis found.    There are no discontinued medications.   No orders of the defined types were placed in this encounter.   Recommendations:   [name] is a 66 y.o. AA  female with history of hypertension, hyperlipidemia, type 2 diabetes mellitus.  Patient reports her father died of an MI at an unknown age, but suspects prior to 66 years old.  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 Patient is referred to our office By PCP for evaluation and management of resistant hypertension.  Patient was last seen in the office 10/10/2021 at which time added Toprol-XL 25 mg p.o. daily given uncontrolled hypertension.  Ordered renal artery duplex, which revealed no evidence of renal artery stenosis.  Also last office visit given left atrial enlargement on EKG ordered echocardiogram which revealed LVEF 61% and mild mitral regurgitation, otherwise unremarkable.  Patient now presents for 6-week follow-up. ***  ***  Patient's blood pressure remains uncontrolled, will therefore add metoprolol succinate 25 mg p.o. once daily, particularly and that this will also improve resting  heart rate which was 96 bpm today.  Given longstanding goal to control hypertension and multiple risk factors will obtain renal artery duplex to evaluate for underlying renal artery stenosis.  EKG today reveals left atrial enlargement, will obtain echocardiogram to evaluate further.  Patient does have multiple cardiovascular risk factors, however EKG is nonischemic and with the exception of fatigue patient is fairly asymptomatic, will hold off on ischemic evaluation at this time.  Further recommendations pending results of cardiac testing.   Marcia Berthold, PA-C 11/16/2021, 4:25 PM Office: [phone]

## 2021-11-21 ENCOUNTER — Ambulatory Visit: Payer: Medicare Other | Admitting: Student

## 2021-11-21 DIAGNOSIS — I10 Essential (primary) hypertension: Secondary | ICD-10-CM

## 2021-11-24 ENCOUNTER — Encounter: Payer: Self-pay | Admitting: Student

## 2021-11-24 ENCOUNTER — Ambulatory Visit: Payer: Medicare Other | Admitting: Student

## 2021-11-24 VITALS — BP 138/89 | HR 74 | Temp 98.0°F | Resp 16 | Ht 62.0 in | Wt 112.0 lb

## 2021-11-24 DIAGNOSIS — I10 Essential (primary) hypertension: Secondary | ICD-10-CM

## 2021-11-24 NOTE — Progress Notes (Signed)
Primary Physician/Referring:  [name], NP  Patient ID: [name], female    DOB: 11-07-1955, 66 y.o.   MRN: [id number]  Chief Complaint  Patient presents with   Results   Hypertension   Follow-up    6 Weeks    HPI:    [name]  is a 66 y.o. AA female with history of hypertension, hyperlipidemia, type 2 diabetes mellitus.  Patient reports her father died of an MI at an unknown age, but suspects prior to 66 years old.  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 Patient is referred to our office By PCP for evaluation and management of resistant hypertension.  Patient was last seen in the office 10/10/2021 at which time added Toprol-XL 25 mg p.o. daily given uncontrolled hypertension.  Ordered renal artery duplex, which revealed no evidence of renal artery stenosis.  Also last office visit given left atrial enlargement on EKG ordered echocardiogram which revealed LVEF 61% and mild mitral regurgitation, otherwise unremarkable.  Patient now presents for 6-week follow-up.  Patient is tolerating addition of Toprol-XL without issue.  Blood pressure is now well controlled.  Patient reports significant improvement of fatigue.  Denies chest pain, dyspnea, dizziness, syncope, near syncope.  Denies symptoms suggestive of TIA or CVA.  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, on left side of brain    Arthritis    "all over" (06/19/2017)   Diabetes mellitus without complication ([place])    diet control, pt denies   Hyperlipidemia    Hypertension    TIA (transient ischemic attack)    pt unaware of this hx on 06/19/2017   Past Surgical History:  Procedure Laterality Date   ANTERIOR CERVICAL DECOMP/DISCECTOMY [place]   FOOT SURGERY Left 11/2019   IR GENERIC HISTORICAL  09/21/2014    IR ANGIO INTRA EXTRACRAN SEL INTERNAL CAROTID UNI L MOD SED 09/21/2014 [name], [facility]   IR GENERIC HISTORICAL  09/21/2014   IR ANGIO INTRA EXTRACRAN SEL COM CAROTID INNOMINATE UNI R MOD SED 09/21/2014 [name], [facility]   IR GENERIC HISTORICAL  09/21/2014   IR ANGIO VERTEBRAL SEL VERTEBRAL UNI L MOD SED 09/21/2014 [name], [facility]   IR GENERIC HISTORICAL  09/21/2014   IR 3D INDEPENDENT WKST 09/21/2014 [name], [facility]   LAPAROSCOPIC CHOLECYSTECTOMY     TUBAL LIGATION     Family History  Problem Relation Age of Onset   Hypertension Mother    Arthritis Mother    Diabetes Mother    Dementia Mother    Heart attack Father    Heart disease Father    Hypertension Sister    Diabetes Sister    Hypertension Sister    Heart disease Sister    Diabetes Sister    Hypertension Brother    Heart disease Brother    Diabetes Brother    Hypertension Brother    Hypertension Brother    Hypertension Brother    Cancer Neg Hx    Colon cancer Neg Hx    Colon polyps Neg Hx    Esophageal cancer Neg Hx    Rectal cancer Neg Hx    Stomach cancer Neg Hx  Social History   Tobacco Use   Smoking status: Never   Smokeless tobacco: Never  Substance Use Topics   Alcohol use: Yes    Alcohol/week: 3.0 standard drinks    Types: 3 Standard drinks or equivalent per week    Comment: occ   Marital Status: Widowed   ROS  Review of Systems  Constitutional: Negative for malaise/fatigue.  Cardiovascular:  Negative for chest pain, claudication, dyspnea on exertion, leg swelling, near-syncope, orthopnea, palpitations, paroxysmal nocturnal dyspnea and syncope.  Neurological:  Negative for dizziness.   Objective  Blood pressure 138/89, pulse 74, temperature 98 F (36.7 C), temperature source Temporal, resp. rate 16, height '5\' 2"'$  (1.575 m), weight 112 lb (50.8 kg), SpO2 99 %.     11/24/2021   11:09 AM 11/14/2021    9:29 AM 10/18/2021    11:27 PM  Vitals with BMI  Height '5\' 2"'$  '5\' 2"'$    Weight 112 lbs 111 lbs 3 oz   BMI 84.13 24.40   Systolic 102 725 366  Diastolic 89 82 440  Pulse 74 83     Physical Exam Vitals reviewed.  Cardiovascular:     Rate and Rhythm: Normal rate and regular rhythm.     Pulses: Intact distal pulses.     Heart sounds: S1 normal and S2 normal. No murmur heard.   No gallop.  Pulmonary:     Effort: Pulmonary effort is normal. No respiratory distress.     Breath sounds: No wheezing, rhonchi or rales.  Musculoskeletal:     Right lower leg: No edema.     Left lower leg: No edema.  Neurological:     Mental Status: She is alert.  Physical exam unchanged compared to previous office visit.  Laboratory examination:   Recent Labs    09/30/21 1200 10/03/21 1849 10/18/21 2019  NA 139 139 142  K 4.1 3.8 3.7  CL 105 107 107  CO2 '27 23 27  '$ GLUCOSE 120* 141* 118*  BUN 26* 21 18  CREATININE 0.73 0.93 0.72  CALCIUM 9.6 10.0 9.4  GFRNONAA >60 >60 >60   CrCl cannot be calculated (Patient's most recent lab result is older than the maximum 21 days allowed.).     Latest Ref Rng & Units 10/18/2021    8:19 PM 10/03/2021    6:49 PM 09/30/2021   12:00 PM  CMP  Glucose 70 - 99 mg/dL 118   141   120    BUN 8 - 23 mg/dL '18   21   26    '$ Creatinine 0.44 - 1.00 mg/dL 0.72   0.93   0.73    Sodium 135 - 145 mmol/L 142   139   139    Potassium 3.5 - 5.1 mmol/L 3.7   3.8   4.1    Chloride 98 - 111 mmol/L 107   107   105    CO2 22 - 32 mmol/L '27   23   27    '$ Calcium 8.9 - 10.3 mg/dL 9.4   10.0   9.6    Total Protein 6.5 - 8.1 g/dL 7.3      Total Bilirubin 0.3 - 1.2 mg/dL 0.5      Alkaline Phos 38 - 126 U/L 75      AST 15 - 41 U/L 19      ALT 0 - 44 U/L 14          Latest Ref Rng & Units 10/18/2021    8:19 PM  10/03/2021    6:49 PM 09/30/2021   12:00 PM  CBC  WBC 4.0 - 10.5 K/uL 11.7   11.1   9.3    Hemoglobin 12.0 - 15.0 g/dL 11.8   12.4   11.7    Hematocrit 36.0 - 46.0 % 37.0   37.7   36.8    Platelets  150 - 400 K/uL 321   318   176      Lipid Panel No results for input(s): CHOL, TRIG, LDLCALC, VLDL, HDL, CHOLHDL, LDLDIRECT in the last 8760 hours.  HEMOGLOBIN A1C Lab Results  Component Value Date   HGBA1C 7.1 (A) 11/14/2021   MPG 140 10/09/2020   TSH No results for input(s): TSH in the last 8760 hours.  External labs:  None  Allergies  No Known Allergies   Medications Prior to Visit:   Outpatient Medications Prior to Visit  Medication Sig Dispense Refill   amLODipine (NORVASC) 10 MG tablet Take 1 tablet (10 mg total) by mouth daily. 90 tablet 1   DULoxetine (CYMBALTA) 30 MG capsule Take 1 capsule (30 mg total) by mouth daily. 90 capsule 1   gabapentin (NEURONTIN) 300 MG capsule 1 tab by mouth in the evening for 1 week, then 1 tab twice a day for 1 week, then 1 tab three times a day     losartan-hydrochlorothiazide (HYZAAR) 100-25 MG tablet Take 1 tablet by mouth daily. 90 tablet 3   meclizine (ANTIVERT) 12.5 MG tablet Take 1 tablet (12.5 mg total) by mouth 3 (three) times daily as needed for dizziness. 60 tablet 1   meloxicam (MOBIC) 15 MG tablet Take 15 mg by mouth daily.     metoprolol succinate (TOPROL XL) 25 MG 24 hr tablet Take 1 tablet (25 mg total) by mouth daily. 30 tablet 3   pantoprazole (PROTONIX) 40 MG tablet Take 1 tablet (40 mg total) by mouth daily. 90 tablet 0   sitaGLIPtin (JANUVIA) 25 MG tablet Take 1 tablet (25 mg total) by mouth daily. 90 tablet 1   tiZANidine (ZANAFLEX) 2 MG tablet Take 1 tablet (2 mg total) by mouth every 6 (six) hours as needed for muscle spasms. (Patient not taking: Reported on 11/14/2021) 60 tablet 1   No facility-administered medications prior to visit.   Final Medications at End of Visit    Current Meds  Medication Sig   amLODipine (NORVASC) 10 MG tablet Take 1 tablet (10 mg total) by mouth daily.   DULoxetine (CYMBALTA) 30 MG capsule Take 1 capsule (30 mg total) by mouth daily.   gabapentin (NEURONTIN) 300 MG capsule 1 tab by  mouth in the evening for 1 week, then 1 tab twice a day for 1 week, then 1 tab three times a day   losartan-hydrochlorothiazide (HYZAAR) 100-25 MG tablet Take 1 tablet by mouth daily.   meclizine (ANTIVERT) 12.5 MG tablet Take 1 tablet (12.5 mg total) by mouth 3 (three) times daily as needed for dizziness.   meloxicam (MOBIC) 15 MG tablet Take 15 mg by mouth daily.   metoprolol succinate (TOPROL XL) 25 MG 24 hr tablet Take 1 tablet (25 mg total) by mouth daily.   pantoprazole (PROTONIX) 40 MG tablet Take 1 tablet (40 mg total) by mouth daily.   sitaGLIPtin (JANUVIA) 25 MG tablet Take 1 tablet (25 mg total) by mouth daily.   Radiology:   No results found.  Cardiac Studies:   Echocardiogram 10/25/2021:  Normal LV systolic function with EF 61%. Left ventricle cavity is normal  in size. Normal left ventricular wall thickness. Normal global wall  motion. Normal diastolic filling pattern, normal LAP.  Mild (Grade I) mitral regurgitation.  Compared to 05/17/2018: G1DD is now normal otherwise no significant  change.  Renal artery duplex  10/25/2021:  No evidence of renal artery occlusive disease in either renal artery.  Normal intrarenal vascular perfusion is noted in both kidneys. Renal  length is within normal limits for both kidneys.  A simple cyst measuring 0.6(L) x 0.6(T) x 0.6(AP) cm is seen in the right  kidney.  EKG:   10/10/2021: Sinus rhythm at a rate of 96 bpm.  Atrial enlargement.  Left axis, left anterior fascicular block.  Nonspecific T wave abnormality.  No evidence of ischemia or underlying injury pattern.  Assessment     ICD-10-CM   1. Resistant hypertension  I10        Medications Discontinued During This Encounter  Medication Reason   tiZANidine (ZANAFLEX) 2 MG tablet      No orders of the defined types were placed in this encounter.   Recommendations:   [name] is a 66 y.o. AA female with history of hypertension, hyperlipidemia, type 2 diabetes  mellitus.  Patient reports her father died of an MI at an unknown age, but suspects prior to 67 years old.  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 Patient is referred to our office By PCP for evaluation and management of resistant hypertension.  Patient was last seen in the office 10/10/2021 at which time added Toprol-XL 25 mg p.o. daily given uncontrolled hypertension.  Ordered renal artery duplex, which revealed no evidence of renal artery stenosis.  Also last office visit given left atrial enlargement on EKG ordered echocardiogram which revealed LVEF 61% and mild mitral regurgitation, otherwise unremarkable.  Patient now presents for 6-week follow-up.  Reviewed and discussed results of echocardiogram and renal artery duplex, details above.  Patient's symptoms of fatigue have improved and blood pressure is now well controlled.  She is tolerating addition of Toprol-XL without issue.  Follow-up in 1 year, sooner if needed.   Marcia Berthold, PA-C 11/24/2021, 11:39 AM Office: [phone]

## 2021-11-29 ENCOUNTER — Other Ambulatory Visit: Payer: Self-pay | Admitting: Primary Care

## 2021-11-29 DIAGNOSIS — Z1231 Encounter for screening mammogram for malignant neoplasm of breast: Secondary | ICD-10-CM

## 2021-12-15 ENCOUNTER — Ambulatory Visit (INDEPENDENT_AMBULATORY_CARE_PROVIDER_SITE_OTHER): Payer: Medicare Other

## 2021-12-15 ENCOUNTER — Encounter (INDEPENDENT_AMBULATORY_CARE_PROVIDER_SITE_OTHER): Payer: Self-pay

## 2021-12-15 DIAGNOSIS — Z0000 Encounter for general adult medical examination without abnormal findings: Secondary | ICD-10-CM | POA: Diagnosis not present

## 2021-12-15 NOTE — Progress Notes (Signed)
Subjective:   [name] is a 66 y.o. female who presents for an Initial Medicare Annual Wellness Visit. I connected with  [name] on 12/15/21 by a audio enabled telemedicine application and verified that I am speaking with the correct person using two identifiers.  Patient Location: Home  Provider Location: Home Office  I discussed the limitations of evaluation and management by telemedicine. The patient expressed understanding and agreed to proceed.     Objective:    Today's Vitals   12/15/21 1142  PainSc: 0-No pain   There is no height or weight on file to calculate BMI.     10/18/2021    8:14 PM 10/04/2021    7:01 AM 09/30/2021   10:51 AM 08/11/2021    8:59 AM 08/08/2021   10:19 PM 07/01/2021    7:21 PM 10/09/2020    4:41 PM  Advanced Directives  Does Patient Have a Medical Advance Directive? No No No No No No   Would patient like information on creating a medical advance directive? No - Patient declined No - Patient declined  No - Patient declined   No - Patient declined    Current Medications (verified) Outpatient Encounter Medications as of 12/15/2021  Medication Sig   amLODipine (NORVASC) 10 MG tablet Take 1 tablet (10 mg total) by mouth daily.   DULoxetine (CYMBALTA) 30 MG capsule Take 1 capsule (30 mg total) by mouth daily.   gabapentin (NEURONTIN) 300 MG capsule 1 tab by mouth in the evening for 1 week, then 1 tab twice a day for 1 week, then 1 tab three times a day   losartan-hydrochlorothiazide (HYZAAR) 100-25 MG tablet Take 1 tablet by mouth daily.   meclizine (ANTIVERT) 12.5 MG tablet Take 1 tablet (12.5 mg total) by mouth 3 (three) times daily as needed for dizziness.   meloxicam (MOBIC) 15 MG tablet Take 15 mg by mouth daily.   metoprolol succinate (TOPROL XL) 25 MG 24 hr tablet Take 1 tablet (25 mg total) by mouth daily.   pantoprazole (PROTONIX) 40 MG tablet Take 1 tablet (40 mg total) by mouth daily.   sitaGLIPtin (JANUVIA) 25 MG tablet Take 1  tablet (25 mg total) by mouth daily.   No facility-administered encounter medications on file as of 12/15/2021.    Allergies (verified) Patient has no known allergies.   History: Past Medical History:  Diagnosis Date   Anemia    hx   Aneurysm (HCC)    small, on left side of brain    Arthritis    "all over" (06/19/2017)   Diabetes mellitus without complication (HCC)    diet control, pt denies   Hyperlipidemia    Hypertension    TIA (transient ischemic attack)    pt unaware of this hx on 06/19/2017   Past Surgical History:  Procedure Laterality Date   ANTERIOR CERVICAL DECOMP/DISCECTOMY FUSION     BACK SURGERY     CESAREAN SECTION  1986   FOOT SURGERY Left 11/2019   IR GENERIC HISTORICAL  09/21/2014   IR ANGIO INTRA EXTRACRAN SEL INTERNAL CAROTID UNI L MOD SED 09/21/2014 Lisbeth Renshaw, MD [facility]   IR GENERIC HISTORICAL  09/21/2014   IR ANGIO INTRA EXTRACRAN SEL COM CAROTID INNOMINATE UNI R MOD SED 09/21/2014 Lisbeth Renshaw, MD [facility]   IR GENERIC HISTORICAL  09/21/2014   IR ANGIO VERTEBRAL SEL VERTEBRAL UNI L MOD SED 09/21/2014 Lisbeth Renshaw, MD [facility]   IR GENERIC HISTORICAL  09/21/2014  IR 3D INDEPENDENT WKST 09/21/2014 Lisbeth Renshaw, MD [facility]   LAPAROSCOPIC CHOLECYSTECTOMY     TUBAL LIGATION     Family History  Problem Relation Age of Onset   Hypertension Mother    Arthritis Mother    Diabetes Mother    Dementia Mother    Heart attack Father    Heart disease Father    Hypertension Sister    Diabetes Sister    Hypertension Sister    Heart disease Sister    Diabetes Sister    Hypertension Brother    Heart disease Brother    Diabetes Brother    Hypertension Brother    Hypertension Brother    Hypertension Brother    Cancer Neg Hx    Colon cancer Neg Hx    Colon polyps Neg Hx    Esophageal cancer Neg Hx    Rectal cancer Neg Hx    Stomach cancer Neg Hx    Social History   Socioeconomic History   Marital status:  Widowed    Spouse name: Not on file   Number of children: 1   Years of education: Not on file   Highest education level: Some college, no degree  Occupational History   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   Also works as a Pharmacologist caregiver for an elderly patient, 66 yr old   Social Determinants of Corporate investment banker Strain: Low Risk  (12/15/2021)   Overall Financial Resource Strain (CARDIA)    Difficulty of Paying Living Expenses: Not hard at all  Food Insecurity: No Food Insecurity (12/15/2021)   Hunger Vital Sign    Worried About Running Out of Food in the Last Year: Never true    Ran Out of Food in the Last Year: Never true  Transportation Needs: No Transportation Needs (12/15/2021)   PRAPARE - Administrator, Civil Service (Medical): No    Lack of Transportation (Non-Medical): No  Physical Activity: Inactive (12/15/2021)   Exercise Vital Sign    Days of Exercise per Week: 0 days    Minutes of Exercise per Hale: 10 min  Stress: No Stress Concern Present (12/15/2021)   [employer] of Occupational Health - Occupational Stress Questionnaire    Feeling of Stress : Only a little  Social Connections: Socially Isolated (12/15/2021)   Social Connection and Isolation Panel [NHANES]    Frequency of Communication with Friends and Family: More than three times a week    Frequency of Social Gatherings with Friends and Family: Twice a week    Attends Religious Services: Never    Automotive engineer or Organizations: No    Attends Banker Meetings: Never    Marital Status: Widowed    Tobacco Counseling Counseling given: Not Answered   Clinical Intake:  Pre-visit preparation completed: Yes  Pain : No/denies pain Pain Score: 0-No pain  Diabetes: Yes CBG done?: No  How often do you need to have someone help you when you read instructions, pamphlets, or other written materials from your doctor or pharmacy?: 1 - Never What is the last grade level you completed in school?: 12th grade  Diabetic? yes  Interpreter Needed?: No      Activities of Daily Living    12/15/2021   11:48 AM  In your present state of health, do you have any difficulty performing the following activities:  Hearing? 0  Vision? 1  Difficulty concentrating or making decisions? 0  Walking or climbing stairs? 1  Dressing or bathing? 0  Doing errands, shopping? 0    Patient Care Team: [name], NP as PCP - General (Internal Medicine)  Indicate any recent Medical Services you may have received from other than [facility] providers in the past year (date may be approximate).     Assessment:   This is a routine wellness examination for [name].  Hearing/Vision screen No results found.  Dietary issues and exercise activities discussed:     Goals Addressed   None   Depression Screen    12/15/2021   11:44 AM 11/14/2021    9:27 AM 10/02/2021   11:01 AM 08/17/2021    2:47 PM 05/15/2021    8:30 AM 02/03/2021   10:18 AM 11/11/2020    8:36 AM  PHQ 2/9 Scores  PHQ - 2 Score 0 0 0 0 0 0 0    Fall Risk    12/15/2021   11:48 AM 11/14/2021    9:27 AM 10/02/2021   11:00 AM 08/17/2021    2:47 PM 05/15/2021    8:30 AM  Fall Risk   Falls in the past year? 1 0 0 0 0  Number falls in past yr: 0      Injury with Fall? 0      Risk for fall due to : No Fall Risks      Follow up Falls evaluation completed        FALL RISK PREVENTION PERTAINING TO THE HOME:  Any  stairs in or around the home? Yes  If so, are there any without handrails? No  Home free of loose throw rugs in walkways, pet beds, electrical cords, etc? Yes  Adequate lighting in your home to reduce risk of falls? Yes   ASSISTIVE DEVICES UTILIZED TO PREVENT FALLS:  Life alert? no Use of a cane, walker or w/c? No  Grab bars in the bathroom? Yes  Shower chair or bench in shower? Yes  Elevated toilet seat or a handicapped toilet? Yes   Cognitive Function:        12/15/2021   11:49 AM  6CIT Screen  What Year? 0 points  What month? 0 points  What time? 0 points  Count back from 20 0 points  Months in reverse 0 points  Repeat phrase 2 points  Total Score 2 points    Immunizations Immunization History  Administered Date(s) Administered   Influenza,inj,Quad PF,6+ Mos 04/25/2017, 05/02/2020, 05/15/2021   PFIZER(Purple Top)SARS-COV-2 Vaccination 01/15/2020, 02/05/2020   PPD Test 04/11/2015, 06/29/2016   Pneumococcal Conjugate-13 01/29/2020   Tdap 04/25/2017    TDAP status: Up to date  Flu Vaccine status: Up to date  Pneumococcal vaccine status: Due, Education has been provided regarding the importance of this vaccine. Advised may receive this vaccine at local pharmacy or Health Dept. Aware to provide a copy of the vaccination record if obtained from local pharmacy or  Health Dept. Verbalized acceptance and understanding.  Covid-19 vaccine status: Information provided on how to obtain vaccines.   Qualifies for Shingles Vaccine? Yes  Zostavax completed No   Shingrix Completed?: No.    Education has been provided regarding the importance of this vaccine. Patient has been advised to call insurance company to determine out of pocket expense if they have not yet received this vaccine. Advised may also receive vaccine at local pharmacy or Health Dept. Verbalized acceptance and understanding.  Screening Tests Health Maintenance  Topic Date Due   OPHTHALMOLOGY EXAM  07/25/2018    COVID-19 Vaccine (3 - Pfizer series) 04/01/2020   Pneumonia Vaccine 16+ Years old (2 - PPSV23 if available, else PCV20) 01/28/2021   Zoster Vaccines- Shingrix (1 of 2) 02/14/2022 (Originally 09/19/2004)   INFLUENZA VACCINE  01/23/2022   HEMOGLOBIN A1C  05/17/2022   COLONOSCOPY (Pts 45-61yrs Insurance coverage will need to be confirmed)  11/06/2022   FOOT EXAM  11/15/2022   MAMMOGRAM  12/30/2022   TETANUS/TDAP  04/26/2027   DEXA SCAN  Completed   Hepatitis C Screening  Completed   HPV VACCINES  Aged Out    Health Maintenance  Health Maintenance Due  Topic Date Due   OPHTHALMOLOGY EXAM  07/25/2018   COVID-19 Vaccine (3 - Pfizer series) 04/01/2020   Pneumonia Vaccine 67+ Years old (2 - PPSV23 if available, else PCV20) 01/28/2021    Colorectal cancer screening: Referral to GI placed provider notified . Pt aware the office will call re: appt.  Mammogram status: Ordered provider notified. Pt provided with contact info and advised to call to schedule appt.   Bone Density status: Ordered provider notified. Pt provided with contact info and advised to call to schedule appt.  Lung Cancer Screening: (Low Dose CT Chest recommended if Age 65-80 years, 30 pack-year currently smoking OR have quit w/in 15years.) does qualify.   Lung Cancer Screening Referral: provider notified  Additional Screening:  Hepatitis C Screening: does qualify; Completed provider notifed  Vision Screening: Recommended annual ophthalmology exams for early detection of glaucoma and other disorders of the eye. Is the patient up to date with their annual eye exam?  No  Who is the provider or what is the name of the office in which the patient attends annual eye exams? Patient did not remember at the time.  If pt is not established with a provider, would they like to be referred to a provider to establish care? No .   Dental Screening: Recommended annual dental exams for proper oral hygiene  Community Resource Referral  / Chronic Care Management: CRR required this visit?  No   CCM required this visit?  No      Plan:     I have personally reviewed and noted the following in the patient's chart:   Medical and social history Use of alcohol, tobacco or illicit drugs  Current medications and supplements including opioid prescriptions. Patient is not currently taking opioid prescriptions. Functional ability and status Nutritional status Physical activity Advanced directives List of other physicians Hospitalizations, surgeries, and ER visits in previous 12 months Vitals Screenings to include cognitive, depression, and falls Referrals and appointments  In addition, I have reviewed and discussed with patient certain preventive protocols, quality metrics, and best practice recommendations. A written personalized care plan for preventive services as well as general preventive health recommendations were provided to patient.     Coolidge Breeze, [place]   12/15/2021   Nurse Notes: provider notified. Bone density  completed, 11/13/2019.

## 2021-12-29 ENCOUNTER — Encounter (INDEPENDENT_AMBULATORY_CARE_PROVIDER_SITE_OTHER): Payer: Self-pay | Admitting: Primary Care

## 2021-12-29 ENCOUNTER — Ambulatory Visit (INDEPENDENT_AMBULATORY_CARE_PROVIDER_SITE_OTHER): Payer: Medicare Other | Admitting: Primary Care

## 2021-12-29 VITALS — BP 143/82 | HR 81 | Temp 97.9°F | Ht 62.0 in | Wt 115.0 lb

## 2021-12-29 DIAGNOSIS — F17200 Nicotine dependence, unspecified, uncomplicated: Secondary | ICD-10-CM

## 2021-12-29 DIAGNOSIS — F4323 Adjustment disorder with mixed anxiety and depressed mood: Secondary | ICD-10-CM

## 2021-12-29 DIAGNOSIS — I10 Essential (primary) hypertension: Secondary | ICD-10-CM | POA: Diagnosis not present

## 2021-12-29 NOTE — Progress Notes (Unsigned)
[place], is a 66 y.o. female  [id number]  [id number]  DOB - December 30, 1955  Chief Complaint  Patient presents with   Follow-up    anxiety       Subjective:   [name] is a 67 y.o. female here today for a follow up visit for blood pressure. Patient has No headache, No chest pain, No abdominal pain - No Nausea, No new weakness tingling or numbness, No Cough - shortness of breath. Today is not a good day for her she has a daughter who is mentally delay 73 years old but functions on a 67 year old level. She took her to see a new therapist that told her he could take her away from her mother and have around the clock staff taking care of her. Patient was so angry and mind racing - told her we will f/u when she was calmer and could answer questions effectively.  No problems updated.  No Known Allergies  Past Medical History:  Diagnosis Date   Anemia    hx   Aneurysm ([place])    small, on left side of brain    Arthritis    "all over" (06/19/2017)   Diabetes mellitus without complication (HCC)    diet control, pt denies   Hyperlipidemia    Hypertension    TIA (transient ischemic attack)    pt unaware of this hx on 06/19/2017    Current Outpatient Medications on File Prior to Visit  Medication Sig Dispense Refill   amLODipine (NORVASC) 10 MG tablet Take 1 tablet (10 mg total) by mouth daily. 90 tablet 1   DULoxetine (CYMBALTA) 30 MG capsule Take 1 capsule (30 mg total) by mouth daily. 90 capsule 1   gabapentin (NEURONTIN) 300 MG capsule 1 tab by mouth in the evening for 1 week, then 1 tab twice a day for 1 week, then 1 tab three times a day     losartan-hydrochlorothiazide (HYZAAR) 100-25 MG tablet Take 1 tablet by mouth daily. 90 tablet 3   meclizine (ANTIVERT) 12.5 MG tablet Take 1 tablet (12.5 mg total) by mouth 3 (three) times daily as needed for dizziness. 60 tablet 1   meloxicam (MOBIC) 15 MG tablet Take 15 mg by mouth daily.      metoprolol succinate (TOPROL XL) 25 MG 24 hr tablet Take 1 tablet (25 mg total) by mouth daily. 30 tablet 3   pantoprazole (PROTONIX) 40 MG tablet Take 1 tablet (40 mg total) by mouth daily. 90 tablet 0   sitaGLIPtin (JANUVIA) 25 MG tablet Take 1 tablet (25 mg total) by mouth daily. 90 tablet 1   No current facility-administered medications on file prior to visit.    Objective:   Vitals:   12/29/21 1023  BP: (!) 143/82  Pulse: 81  Temp: 97.9 F (36.6 C)  TempSrc: Oral  SpO2: 100%  Weight: 115 lb (52.2 kg)  Height: '5\' 2"'$  (1.575 m)    Exam General appearance : Awake, alert, not in any distress. Speech Clear. Not toxic looking HEENT: Atraumatic and Normocephalic, pupils equally reactive to light and accomodation Neck: Supple, no JVD. No cervical lymphadenopathy.  Chest: Good air entry bilaterally, no added sounds  CVS: S1 S2 regular, no murmurs.  Abdomen: Bowel sounds present, Non tender and not distended with no gaurding, rigidity or rebound. Extremities: B/L Lower Ext shows no edema, both legs are warm to touch Neurology: Awake alert, and oriented X 3, CN II-XII intact, Non focal Skin:  No Rash  Data Review Lab Results  Component Value Date   HGBA1C 7.1 (A) 11/14/2021   HGBA1C 7.9 (A) 08/17/2021   HGBA1C 6.8 (A) 05/15/2021    Assessment & Plan  [name] was seen today for follow-up.  Diagnoses and all orders for this visit:  Adjustment disorder with mixed anxiety and depressed mood Unable to tell if Cymbalta is helping situational increased stress, anxiety and depression - re-evaluate on f/u   Essential hypertension Despite how she is feeling Bp is acceptable BP goal - < 140/90  Explained that having normal blood pressure is the goal and medications are helping to get to goal and maintain normal blood pressure. DIET: Limit salt intake, read nutrition labels to check salt content, limit fried and high fatty foods  Avoid using multisymptom OTC cold preparations that  generally contain sudafed which can rise BP. Consult with pharmacist on best cold relief products to use for persons with HTN EXERCISE Discussed incorporating exercise such as walking - 30 minutes most days of the week and can do in 10 minute intervals    Tobacco dependence Previously cutting down to stopping smoking currently frustrated , angry and mad . Re visit cessation on f/u.  Patient have been counseled extensively about nutrition and exercise. Other issues discussed during this visit include: low cholesterol diet, weight control and daily exercise, foot care, annual eye examinations at Ophthalmology, importance of adherence with medications and regular follow-up. We also discussed long term complications of uncontrolled diabetes and hypertension.   Return in about 2 months (around 03/01/2022) for Bp.  The patient was given clear instructions to go to ER or return to medical center if symptoms don't improve, worsen or new problems develop. The patient verbalized understanding. The patient was told to call to get lab results if they haven't heard anything in the next week.   This note has been created with Surveyor, quantity. Any transcriptional errors are unintentional.   [name], NP 01/04/2022, 1:17 PM 37 deve 12-13

## 2022-01-01 ENCOUNTER — Ambulatory Visit: Payer: Medicare Other

## 2022-01-02 ENCOUNTER — Ambulatory Visit
Admission: RE | Admit: 2022-01-02 | Discharge: 2022-01-02 | Disposition: A | Discharge status: Home / Self Care | Payer: Medicare Other | Source: Ambulatory Visit | Attending: Primary Care | Admitting: Primary Care

## 2022-01-02 DIAGNOSIS — Z1231 Encounter for screening mammogram for malignant neoplasm of breast: Secondary | ICD-10-CM

## 2022-01-05 ENCOUNTER — Other Ambulatory Visit: Payer: Self-pay | Admitting: Student

## 2022-01-17 ENCOUNTER — Other Ambulatory Visit (HOSPITAL_COMMUNITY): Payer: Self-pay

## 2022-01-17 ENCOUNTER — Other Ambulatory Visit: Payer: Self-pay | Admitting: Gastroenterology

## 2022-01-17 MED ORDER — PANTOPRAZOLE SODIUM 40 MG PO TBEC
40.0000 mg | DELAYED_RELEASE_TABLET | Freq: Every day | ORAL | 0 refills | Status: DC
  Filled 2022-01-17 – 2022-03-07 (×3): qty 90, 90d supply, fill #0

## 2022-01-22 ENCOUNTER — Emergency Department (HOSPITAL_BASED_OUTPATIENT_CLINIC_OR_DEPARTMENT_OTHER)
Admission: EM | Admit: 2022-01-22 | Discharge: 2022-01-22 | Disposition: A | Discharge status: Home / Self Care | Payer: Medicare Other | Attending: Emergency Medicine | Admitting: Emergency Medicine

## 2022-01-22 ENCOUNTER — Emergency Department (HOSPITAL_BASED_OUTPATIENT_CLINIC_OR_DEPARTMENT_OTHER): Payer: Medicare Other | Admitting: Radiology

## 2022-01-22 ENCOUNTER — Encounter (HOSPITAL_BASED_OUTPATIENT_CLINIC_OR_DEPARTMENT_OTHER): Payer: Self-pay | Admitting: Obstetrics and Gynecology

## 2022-01-22 ENCOUNTER — Other Ambulatory Visit: Payer: Self-pay

## 2022-01-22 ENCOUNTER — Other Ambulatory Visit (HOSPITAL_BASED_OUTPATIENT_CLINIC_OR_DEPARTMENT_OTHER): Payer: Self-pay

## 2022-01-22 DIAGNOSIS — Z79899 Other long term (current) drug therapy: Secondary | ICD-10-CM | POA: Diagnosis not present

## 2022-01-22 DIAGNOSIS — I10 Essential (primary) hypertension: Secondary | ICD-10-CM | POA: Insufficient documentation

## 2022-01-22 DIAGNOSIS — M25512 Pain in left shoulder: Secondary | ICD-10-CM | POA: Diagnosis present

## 2022-01-22 DIAGNOSIS — E119 Type 2 diabetes mellitus without complications: Secondary | ICD-10-CM | POA: Diagnosis not present

## 2022-01-22 LAB — CBC WITH DIFFERENTIAL/PLATELET
Abs Immature Granulocytes: 0.04 10*3/uL (ref 0.00–0.07)
Basophils Absolute: 0.1 10*3/uL (ref 0.0–0.1)
Basophils Relative: 1 %
Eosinophils Absolute: 0.1 10*3/uL (ref 0.0–0.5)
Eosinophils Relative: 1 %
HCT: 38.1 % (ref 36.0–46.0)
Hemoglobin: 12.4 g/dL (ref 12.0–15.0)
Immature Granulocytes: 0 %
Lymphocytes Relative: 25 %
Lymphs Abs: 2.5 10*3/uL (ref 0.7–4.0)
MCH: 27.8 pg (ref 26.0–34.0)
MCHC: 32.5 g/dL (ref 30.0–36.0)
MCV: 85.4 fL (ref 80.0–100.0)
Monocytes Absolute: 0.4 10*3/uL (ref 0.1–1.0)
Monocytes Relative: 4 %
Neutro Abs: 7 10*3/uL (ref 1.7–7.7)
Neutrophils Relative %: 69 %
Platelets: 346 10*3/uL (ref 150–400)
RBC: 4.46 MIL/uL (ref 3.87–5.11)
RDW: 14.1 % (ref 11.5–15.5)
WBC: 10.1 10*3/uL (ref 4.0–10.5)
nRBC: 0 % (ref 0.0–0.2)

## 2022-01-22 LAB — TROPONIN I (HIGH SENSITIVITY)
Troponin I (High Sensitivity): 3 ng/L (ref <18)
Troponin I (High Sensitivity): 3 ng/L (ref <18)

## 2022-01-22 LAB — BASIC METABOLIC PANEL
Anion gap: 10 (ref 5–15)
BUN: 15 mg/dL (ref 8–23)
CO2: 27 mmol/L (ref 22–32)
Calcium: 9.4 mg/dL (ref 8.9–10.3)
Chloride: 105 mmol/L (ref 98–111)
Creatinine, Ser: 0.64 mg/dL (ref 0.44–1.00)
GFR, Estimated: >60 mL/min (ref >60)
Glucose, Bld: 140 mg/dL — ABNORMAL HIGH (ref 70–99)
Potassium: 4 mmol/L (ref 3.5–5.1)
Sodium: 142 mmol/L (ref 135–145)

## 2022-01-22 MED ORDER — HYDROCHLOROTHIAZIDE 25 MG PO TABS
25.0000 mg | ORAL_TABLET | Freq: Once | ORAL | Status: AC
  Administered 2022-01-22: 25 mg via ORAL
  Filled 2022-01-22: qty 1

## 2022-01-22 MED ORDER — METOPROLOL SUCCINATE ER 25 MG PO TB24
25.0000 mg | ORAL_TABLET | Freq: Every day | ORAL | Status: DC
  Administered 2022-01-22: 25 mg via ORAL
  Filled 2022-01-22 (×2): qty 1

## 2022-01-22 MED ORDER — LOSARTAN POTASSIUM 25 MG PO TABS
100.0000 mg | ORAL_TABLET | Freq: Once | ORAL | Status: AC
  Administered 2022-01-22: 100 mg via ORAL
  Filled 2022-01-22: qty 4

## 2022-01-22 MED ORDER — NAPROXEN 375 MG PO TABS
375.0000 mg | ORAL_TABLET | Freq: Two times a day (BID) | ORAL | 0 refills | Status: DC
  Filled 2022-01-22: qty 10, 5d supply, fill #0

## 2022-01-22 MED ORDER — AMLODIPINE BESYLATE 5 MG PO TABS
10.0000 mg | ORAL_TABLET | Freq: Once | ORAL | Status: AC
  Administered 2022-01-22: 10 mg via ORAL
  Filled 2022-01-22: qty 2

## 2022-01-22 MED ORDER — METHOCARBAMOL 500 MG PO TABS
500.0000 mg | ORAL_TABLET | Freq: Three times a day (TID) | ORAL | 0 refills | Status: DC | PRN
  Filled 2022-01-22: qty 20, 7d supply, fill #0

## 2022-01-22 NOTE — ED Notes (Signed)
Left shoulder pain, ongoing for a month and its been intermittent. Worsens upon moving the arm certain ways. No known issues with the shoulder. No numbness/tingling, only pain. No CP or SOB.

## 2022-01-22 NOTE — Discharge Instructions (Signed)
Your testing is reassuring.  No evidence of heart attack or blood clot in the lung.  Take your medications as prescribed.  Follow-up with your primary doctor or the orthopedic doctor for MRI of your shoulder for further evaluation of your rotator cuff.  Take the anti-inflammatories as prescribed.  Return to the ED with difficulty breathing, chest pain, weakness in the arm, numbness, tingling, or other concerns

## 2022-01-22 NOTE — ED Notes (Signed)
Patient verbalizes understanding of discharge instructions. Opportunity for questioning and answers were provided. Patient discharged from ED.  °

## 2022-01-22 NOTE — ED Triage Notes (Signed)
Patient reports to the er for left shoulder pain that she reports has been going on for over a month. Patient reports pain with motion and is unable to simulate emptying a can without pain on the internal rotation movement.

## 2022-01-22 NOTE — ED Provider Notes (Signed)
[facility]ERGENC[facility] DEPT Provider Note   CSN: [id number] Arrival date & time: 01/22/22  6503     History  Chief Complaint  Patient presents with   Shoulder Pain    [name] is a 66 y.o. female.  Patient with a history of hypertension, diabetes presenting with 1 month of left shoulder pain that is fairly constant worse with movement.  She has been taking ibuprofen at home without relief.  Denies any fall or injury.  The pain is to her left upper arm and shoulder worse with movement.  No numbness or tingling.  No chest pain or shortness of breath.  No cough, nausea, vomiting or fever.  No weakness in the arm. Blood pressure is high today and did not take her medications this morning Pain with range of motion of the shoulder.  The history is provided by the patient.  Shoulder Pain      Home Medications Prior to Admission medications   Medication Sig Start Date End Date Taking? Authorizing Provider  amLODipine (NORVASC) 10 MG tablet Take 1 tablet (10 mg total) by mouth daily. 05/15/21   [name], NP  DULoxetine (CYMBALTA) 30 MG capsule Take 1 capsule (30 mg total) by mouth daily. 11/14/21   [name], NP  gabapentin (NEURONTIN) 300 MG capsule 1 tab by mouth in the evening for 1 week, then 1 tab twice a day for 1 week, then 1 tab three times a day 06/14/21   [provider]  losartan-hydrochlorothiazide (HYZAAR) 100-25 MG tablet Take 1 tablet by mouth daily. 05/15/21   [name], NP  meclizine (ANTIVERT) 12.5 MG tablet Take 1 tablet (12.5 mg total) by mouth 3 (three) times daily as needed for dizziness. 11/14/21   [name], NP  meloxicam (MOBIC) 15 MG tablet Take 15 mg by mouth daily. 07/12/21   [provider]  metoprolol succinate (TOPROL-XL) 25 MG 24 hr tablet TAKE 1 TABLET (25 MG TOTAL) BY MOUTH DAILY. 01/05/22   Cantwell, Celeste C, PA-C  pantoprazole (PROTONIX) 40 MG tablet Take 1 tablet (40 mg  total) by mouth daily. 01/17/22   Ladene Artist, MD  sitaGLIPtin (JANUVIA) 25 MG tablet Take 1 tablet (25 mg total) by mouth daily. 11/14/21   [name], NP      Allergies    Patient has no known allergies.    Review of Systems   Review of Systems  Musculoskeletal:  Positive for arthralgias and myalgias.   all other systems are negative except as noted in the HPI and PMH.    Physical Exam Updated Vital Signs BP (!) 190/111   Pulse 78   Temp 98.1 F (36.7 C)   Resp 17   Ht '5\' 1"'$  (1.549 m)   Wt 53.1 kg   SpO2 100%   BMI 22.11 kg/m  Physical Exam Vitals and nursing note reviewed.  Constitutional:      General: She is not in acute distress.    Appearance: She is well-developed.  HENT:     Head: Normocephalic and atraumatic.     Mouth/Throat:     Pharynx: No oropharyngeal exudate.  Eyes:     Conjunctiva/sclera: Conjunctivae normal.     Pupils: Pupils are equal, round, and reactive to light.  Neck:     Comments: No meningismus. Cardiovascular:     Rate and Rhythm: Normal rate and regular rhythm.     Heart sounds: Normal heart sounds. No murmur heard. Pulmonary:  Effort: Pulmonary effort is normal. No respiratory distress.     Breath sounds: Normal breath sounds.  Abdominal:     Palpations: Abdomen is soft.     Tenderness: There is no abdominal tenderness. There is no guarding or rebound.  Musculoskeletal:        General: Tenderness present. Normal range of motion.     Cervical back: Normal range of motion and neck supple.     Comments: Pain with range of motion of left shoulder.  No deformity.  Intact radial pulse and grip strength bilaterally.  Equal shoulder shrug. Pain with internal rotation and abduction of the shoulder  Skin:    General: Skin is warm.  Neurological:     Mental Status: She is alert and oriented to person, place, and time.     Cranial Nerves: No cranial nerve deficit.     Motor: No abnormal muscle tone.     Coordination:  Coordination normal.     Comments:  5/5 strength throughout. CN 2-12 intact.Equal grip strength.   Psychiatric:        Behavior: Behavior normal.     ED Results / Procedures / Treatments   Labs (all labs ordered are listed, but only abnormal results are displayed) Labs Reviewed  BASIC METABOLIC PANEL - Abnormal; Notable for the following components:      Result Value   Glucose, Bld 140 (*)    All other components within normal limits  CBC WITH DIFFERENTIAL/PLATELET  TROPONIN I (HIGH SENSITIVITY)  TROPONIN I (HIGH SENSITIVITY)    EKG EKG Interpretation  Date/Time:  Monday January 22 2022 12:10:18 EDT Ventricular Rate:  78 PR Interval:  160 QRS Duration: 86 QT Interval:  385 QTC Calculation: 439 R Axis:   -34 Text Interpretation: Sinus rhythm Abnormal R-wave progression, late transition Probable left ventricular hypertrophy No significant change was found Confirmed by Ezequiel Essex [id number]) on 01/22/2022 12:22:26 PM  Radiology DG Shoulder Left  Result Date: 01/22/2022 CLINICAL DATA:  Fall 2 weeks ago.  Shoulder pain. EXAM: LEFT SHOULDER - 2+ VIEW COMPARISON:  Left shoulder radiographs 07/10/2017 FINDINGS: Mild glenohumeral joint space narrowing and mild peripheral glenoid degenerative osteophytosis. Mild-to-moderate acromioclavicular joint space narrowing and peripheral osteophytosis appears mildly increased from prior. Partial visualization of lower cervical spine ACDF hardware. Mild calcifications within the aortic arch. 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 Electronically Signed   By: Yvonne Kendall M.D.   On: 01/22/2022 10:32    Procedures Procedures    Medications Ordered in ED Medications  amLODipine (NORVASC) tablet 10 mg (has no administration in time range)  metoprolol succinate (TOPROL-XL) 24 hr tablet 25 mg (has no administration in time range)  losartan (COZAAR) tablet 100 mg (has no administration in time range)   hydrochlorothiazide (HYDRODIURIL) tablet 25 mg (has no administration in time range)    ED Course/ Medical Decision Making/ A&P                           Medical Decision Making Amount and/or Complexity of Data Reviewed Labs: ordered. Decision-making details documented in ED Course. Radiology: ordered and independent interpretation performed. Decision-making details documented in ED Course. ECG/medicine tests: ordered and independent interpretation performed. Decision-making details documented in ED Course.  Risk Prescription drug management.   1 month of left shoulder pain without trauma.  Intact distal pulses, strength and reflexes.  Low suspicion for ACS.  X-ray consistent with arthritis.  Results reviewed and interpreted by me.  EKG with no acute ST changes.  Low suspicion for ACS or pulmonary embolism.  Troponin negative x2.  Blood pressure has improved with her home medications.  She denies any chest pain, back pain or shortness of breath.  Discussed anti-inflammatories, muscle relaxers, PCP and orthopedic follow-up.  She may need MRI for further evaluation of likely rotator cuff issues with her shoulder. Return precautions discussed       Final Clinical Impression(s) / ED Diagnoses Final diagnoses:  Acute pain of left shoulder    Rx / DC Orders ED Discharge Orders     None         Louise Rawson, Annie Main, MD 01/22/22 [phone]

## 2022-01-24 ENCOUNTER — Other Ambulatory Visit (HOSPITAL_BASED_OUTPATIENT_CLINIC_OR_DEPARTMENT_OTHER): Payer: Self-pay

## 2022-01-25 ENCOUNTER — Other Ambulatory Visit (HOSPITAL_COMMUNITY): Payer: Self-pay

## 2022-01-26 ENCOUNTER — Other Ambulatory Visit (HOSPITAL_COMMUNITY): Payer: Self-pay

## 2022-02-02 ENCOUNTER — Ambulatory Visit: Payer: Medicare Other | Attending: Orthopedic Surgery

## 2022-02-02 ENCOUNTER — Other Ambulatory Visit: Payer: Self-pay

## 2022-02-02 DIAGNOSIS — G8929 Other chronic pain: Secondary | ICD-10-CM | POA: Diagnosis present

## 2022-02-02 DIAGNOSIS — M25512 Pain in left shoulder: Secondary | ICD-10-CM | POA: Diagnosis present

## 2022-02-02 DIAGNOSIS — R600 Localized edema: Secondary | ICD-10-CM | POA: Insufficient documentation

## 2022-02-02 DIAGNOSIS — M6281 Muscle weakness (generalized): Secondary | ICD-10-CM | POA: Insufficient documentation

## 2022-02-02 NOTE — Therapy (Signed)
OUTPATIENT PHYSICAL THERAPY SHOULDER EVALUATION   Patient Name: [name] MRN: [id number] DOB:05-06-1956, 66 y.o., female Today's Date: 02/02/2022   PT End of Session - 02/02/22 1233     Visit Number 1    Number of Visits 9    Date for PT Re-Evaluation 04/06/22    Authorization Type [employer] MCR    Authorization Time Period FOTO v6, v10, KX mod v15    Progress Note Due on Visit 10    PT Start Time 1217    PT Stop Time 1259    PT Time Calculation (min) 42 min    Activity Tolerance Patient tolerated treatment well    Behavior During Therapy WFL for tasks assessed/performed             Past Medical History:  Diagnosis Date   Anemia    hx   Aneurysm ([place])    small, on left side of brain    Arthritis    "all over" (06/19/2017)   Diabetes mellitus without complication ([place])    diet control, pt denies   Hyperlipidemia    Hypertension    TIA (transient ischemic attack)    pt unaware of this hx on 06/19/2017   Past Surgical History:  Procedure Laterality Date   ANTERIOR CERVICAL DECOMP/DISCECTOMY [place]   FOOT SURGERY Left 11/2019   IR GENERIC HISTORICAL  09/21/2014   IR ANGIO INTRA EXTRACRAN SEL INTERNAL CAROTID UNI L MOD SED 09/21/2014 [name], MD [facility]   IR GENERIC HISTORICAL  09/21/2014   IR ANGIO INTRA EXTRACRAN SEL COM CAROTID INNOMINATE UNI R MOD SED 09/21/2014 [name], MD [facility]   IR GENERIC HISTORICAL  09/21/2014   IR ANGIO VERTEBRAL SEL VERTEBRAL UNI L MOD SED 09/21/2014 [name], MD [facility]   IR GENERIC HISTORICAL  09/21/2014   IR 3D INDEPENDENT WKST 09/21/2014 [name], MD [facility]   LAPAROSCOPIC CHOLECYSTECTOMY     TUBAL LIGATION     Patient Active Problem List   Diagnosis Date Noted   Numbness and tingling of left hand 02/03/2021   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   Sepsis due to undetermined organism ([place]) 10/10/2020   Enteritis of  infectious origin 10/09/2020   Diabetes mellitus type 2 in nonobese ([place]) 10/09/2020   Bilateral impacted cerumen 04/02/2019   Conductive hearing loss, bilateral 04/02/2019   Nausea and vomiting 05/17/2018   Enteritis 06/19/2017   Abdominal pain 06/18/2017   Hyperlipidemia 06/18/2017   Elevated glucose 06/18/2017   Need for hepatitis C screening test 05/23/2017   Breast pain, left 01/05/2016   Left ankle sprain 01/20/2015   Gastroenteritis 11/08/2014   Malaise 10/14/2014   Hypokalemia 10/14/2014   Essential hypertension    Bilateral wrist pain 10/06/2014   Hot flashes, menopausal 10/06/2014   Cerebral aneurysm without rupture 08/20/2014   TIA (transient ischemic attack) 08/19/2014   Accelerated hypertension 02/24/2013   Depression 02/24/2013    PCP: [name], NP  REFERRING PROVIDER: Marchia Bond, MD  REFERRING DIAG: M25.519 (ICD-10-CM) - Shoulder pain  THERAPY DIAG:  Chronic left shoulder pain - Plan: PT plan of care cert/re-cert  Muscle weakness (generalized) - Plan: PT plan of care cert/re-cert  Localized edema - Plan: PT plan of care cert/re-cert  Rationale for Evaluation and Treatment Rehabilitation  ONSET DATE: Four months ago  SUBJECTIVE:  SUBJECTIVE STATEMENT: Pt reports primary c/o Lt shoulder pain of insidious onset lasting about four months. She reports the pain can refer down to her elbow, but is mostly localized around the shoulder. She denies any N/T related to this problem. Pt describes the pain as sharp and sometimes dull. Current pain is 8/10. Worst pain is 10/10. Best pain is 2-3/10. Aggravating factors include reaching certain directions, overhead movements, lifting any amount of weight, and turning in her sleep. Easing factors include pain cream, heating pad. 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 However, she reports that typically she is required to be able to lift 25 pounds.  PERTINENT HISTORY: HTN, DMII, arthritis, brain aneurysm, hx of TIA, Anterior Cervical decompression/ discectomy/ fusion in the late 1990's   PAIN:  Are you having pain? Yes: NPRS scale: 8/10 Pain location: Lt shoulder Pain description: Sharp, dull Aggravating factors: reaching certain directions, overhead movements, lifting any amount of weight, and turning in her sleep Relieving factors: pain cream, heating pad  PRECAUTIONS: None  WEIGHT BEARING RESTRICTIONS No  FALLS:  Has patient fallen in last 6 months? 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  PATIENT SURVEYS:  FOTO 55%, predicted 65% in 12 visits  COGNITION:  Overall cognitive status: Within functional limits for tasks assessed     SENSATION: Not tested  POSTURE: Forward shoulders, head  UPPER EXTREMITY ROM:   A/PROM Right eval Left eval  Shoulder flexion 170 148/ 160p!  Shoulder abduction 170 160p!, 168p!  Shoulder internal rotation 95 90/95p!  Shoulder external rotation 90 75/80p!  (Blank rows = not tested)  UPPER EXTREMITY MMT:  MMT Right eval Left eval  Shoulder flexion 4+/5 3+/5  Shoulder abduction 5/5 3+/5  Shoulder internal rotation 5/5 5/5  Shoulder external rotation 4+/5 3+/5  Middle trapezius 4/5 4/5  Lower trapezius 3+/5 Unable to assume testing positive due to pain  Latissimus dorsi 4+/5 4+/5  Elbow flexion  5/5 5/5  Elbow extension 5/5 5/5  Grip strength (lbs)    (Blank rows = not tested)  SHOULDER SPECIAL TESTS:  Hawkins-Kennedy: (+) on Lt  Neer's: (+) on Lt  Painful arc: (-) on Lt (Painful from roughly 140-end range)  Cross-body adduction test: (-) on Lt  O'Brien's: (+) on Lt  JOINT MOBILITY TESTING:  Hypomobile and painful in all planes with Lt GHJ passive accessory mobility testing vs Rt  PALPATION:  TTP to posterior shoulder capsule/ teres insertions   TODAY'S TREATMENT:  02/02/2022: Demonstrated and issued HEP   PATIENT EDUCATION: Education details: Pt educated on POC, prognosis, potential underlying pathophysiology, and HEP Person educated: Patient Education method: Explanation, Demonstration, and Handouts Education comprehension: verbalized understanding and returned demonstration   HOME EXERCISE PROGRAM: Access Code: [email or web address] URL: [email or web address] Date: 02/02/2022 Prepared by: Vanessa Woodburn  Exercises - Shoulder Scaption AAROM with Dowel (Mirrored)  - 2 x daily - 7 x weekly - 3 sets - 10 reps - 5 seconds hold - Standing Shoulder External Rotation AAROM with Dowel  - 2 x daily - 7 x weekly - 3 sets - 10 reps - 5 seconds hold -  Standing Shoulder Internal Rotation Stretch with Towel  - 2 x daily - 7 x weekly - 3 sets - 10 reps - 5 seconds hold - Shoulder External Rotation and Scapular Retraction with Resistance  - 1 x daily - 7 x weekly - 3 sets - 10 reps - 5 seconds hold  ASSESSMENT:  CLINICAL IMPRESSION: Patient is a 66 y.o. F who was seen today for physical therapy evaluation and treatment for subacute Lt shoulder pain and stiffness. Upon assessment, the pt's primary impairments include painful and limited Lt shoulder flexion, abduction, and ER AROM, painful and weak Lt shoulder flexion, abduction, and ER MMT, TTP to Lt shoulder posterior capsule/ teres insertions, hypomobile and painful global Lt shoulder passive accessory mobility, and weak  BIL parascapular strength. Ruling up adhesive capsulitis due to pt demographics including middle-aged-to-elderly Female with DMII and hx of TIA, pain with sleeping, idiopathic presentation, pain with reaching and overhead movements, sharp pain with end-range movements, and hypomobile Lt shoulder passive accessory mobility in all planes. Cannot rule out RTC pathology due to positive Neer's and Hawkins-Kennedy testing, painful and weak Lt shoulder MMT, and painful and limited Lt shoulder ROM, however, painful arc was negative, which decreases this likelihood (pt has pain primarily at end ranges of motion). 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 Pt will benefit from skilled PT to address her primary impairments and return to her prior level of function with less limitation.   OBJECTIVE IMPAIRMENTS decreased activity tolerance, decreased balance, decreased endurance, decreased mobility, decreased ROM, decreased strength, hypomobility, increased edema, impaired flexibility, impaired UE functional use, improper body mechanics, postural dysfunction, and pain.   ACTIVITY LIMITATIONS carrying, lifting, sleeping, bathing, dressing, hygiene/grooming, and caring for others  PARTICIPATION LIMITATIONS: meal prep, cleaning, laundry, driving, shopping, community activity, occupation, and yard work  PERSONAL FACTORS 3+ comorbidities: See medical hx  are also affecting patient's functional outcome.   REHAB POTENTIAL: Good  CLINICAL DECISION MAKING: Stable/uncomplicated  EVALUATION COMPLEXITY: Low   GOALS: Goals reviewed with patient? Yes  SHORT TERM GOALS: Target date: 03/02/2022   Pt will report understanding and adherence to initial HEP in order to promote independence in the management of primary impairments. Baseline: HEP provided at eval Goal status:  INITIAL   LONG TERM GOALS: Target date: 03/30/2022    Pt will achieve a FOTO score of 65% in order to demonstrate improved functional ability as it relates to her primary impairments. Baseline: 55% Goal status: INITIAL  2.  Pt will achieve Lt shoulder elevation AROM of 170 degrees in order to put away dishes into overhead cabinets with less limitation. Baseline: Flexion: 148d; Abduction: 160d Goal status: INITIAL  3.  Pt will report average pain at rest as 0-3/10 pain in order to sleep through the night with less limitation. Baseline: Resting pain average of 8-9/10 Goal status: INITIAL  4.  Pt will achieve global Lt shoulder MMT of 5/5 in order to progress to lifting weight with her Lt UE for her job demands. Baseline: See MMT chart Goal status: INITIAL    PLAN: PT FREQUENCY: 1x/week  PT DURATION: 8 weeks  PLANNED INTERVENTIONS: Therapeutic exercises, Therapeutic activity, Neuromuscular re-education, Patient/Family education, Self Care, Joint mobilization, Joint manipulation, Aquatic Therapy, Dry Needling, Electrical stimulation, Spinal manipulation, Spinal mobilization, Cryotherapy, Moist heat, Taping, Vasopneumatic device, Biofeedback, Ionotophoresis '4mg'$ /ml Dexamethasone, Manual therapy, and Re-evaluation  PLAN FOR NEXT SESSION: Progress shoulder/ parascapular strengthening, ROM, manual techniques for joint mobility    Vanessa Woodmoor, PT, DPT 02/02/22 1:10 PM

## 2022-02-05 ENCOUNTER — Ambulatory Visit: Payer: Medicare Other

## 2022-02-05 ENCOUNTER — Other Ambulatory Visit (HOSPITAL_COMMUNITY): Payer: Self-pay

## 2022-02-08 ENCOUNTER — Ambulatory Visit: Payer: Medicare Other

## 2022-02-08 DIAGNOSIS — M25512 Pain in left shoulder: Secondary | ICD-10-CM | POA: Diagnosis not present

## 2022-02-08 DIAGNOSIS — G8929 Other chronic pain: Secondary | ICD-10-CM

## 2022-02-08 DIAGNOSIS — R600 Localized edema: Secondary | ICD-10-CM

## 2022-02-08 DIAGNOSIS — M6281 Muscle weakness (generalized): Secondary | ICD-10-CM

## 2022-02-08 NOTE — Therapy (Signed)
OUTPATIENT PHYSICAL THERAPY TREATMENT NOTE   Patient Name: [name] MRN: [id number] DOB:14-May-1956, 66 y.o., female Today's Date: 02/08/2022  PCP: [name], NP REFERRING PROVIDER: Marchia Bond, MD  END OF SESSION:   PT End of Session - 02/08/22 1119     Visit Number 2    Number of Visits 9    Date for PT Re-Evaluation 04/06/22    Authorization Type [employer] MCR    Authorization Time Period FOTO v6, v10, KX mod v15    Progress Note Due on Visit 10    PT Start Time 1130    PT Stop Time 1210    PT Time Calculation (min) 40 min    Activity Tolerance Patient tolerated treatment well    Behavior During Therapy WFL for tasks assessed/performed             Past Medical History:  Diagnosis Date   Anemia    hx   Aneurysm ([place])    small, on left side of brain    Arthritis    "all over" (06/19/2017)   Diabetes mellitus without complication (HCC)    diet control, pt denies   Hyperlipidemia    Hypertension    TIA (transient ischemic attack)    pt unaware of this hx on 06/19/2017   Past Surgical History:  Procedure Laterality Date   ANTERIOR CERVICAL DECOMP/DISCECTOMY [place] Left 11/2019   IR GENERIC HISTORICAL  09/21/2014   IR ANGIO INTRA EXTRACRAN SEL INTERNAL CAROTID UNI L MOD SED 09/21/2014 [name], MD [facility]   IR GENERIC HISTORICAL  09/21/2014   IR ANGIO INTRA EXTRACRAN SEL COM CAROTID INNOMINATE UNI R MOD SED 09/21/2014 [name], MD [facility]   IR GENERIC HISTORICAL  09/21/2014   IR ANGIO VERTEBRAL SEL VERTEBRAL UNI L MOD SED 09/21/2014 [name], MD [facility]   IR GENERIC HISTORICAL  09/21/2014   IR 3D INDEPENDENT WKST 09/21/2014 [name], MD [facility]   LAPAROSCOPIC CHOLECYSTECTOMY     TUBAL LIGATION     Patient Active Problem List   Diagnosis Date Noted   Numbness and tingling of left hand 02/03/2021   Carpal tunnel syndrome of left  wrist 02/03/2021   Sepsis due to undetermined organism ([place]) 10/10/2020   Enteritis of infectious origin 10/09/2020   Diabetes mellitus type 2 in nonobese ([place]) 10/09/2020   Bilateral impacted cerumen 04/02/2019   Conductive hearing loss, bilateral 04/02/2019   Nausea and vomiting 05/17/2018   Enteritis 06/19/2017   Abdominal pain 06/18/2017   Hyperlipidemia 06/18/2017   Elevated glucose 06/18/2017   Need for hepatitis C screening test 05/23/2017   Breast pain, left 01/05/2016   Left ankle sprain 01/20/2015   Gastroenteritis 11/08/2014   Malaise 10/14/2014   Hypokalemia 10/14/2014   Essential hypertension    Bilateral wrist pain 10/06/2014   Hot flashes, menopausal 10/06/2014   Cerebral aneurysm without rupture 08/20/2014   TIA (transient ischemic attack) 08/19/2014   Accelerated hypertension 02/24/2013   Depression 02/24/2013    REFERRING DIAG: M25.519 (ICD-10-CM) - Shoulder pain  THERAPY DIAG:  Chronic left shoulder pain  Muscle weakness (generalized)  Localized edema  Rationale for Evaluation and Treatment Rehabilitation  PERTINENT HISTORY: HTN, DMII, arthritis, brain aneurysm, hx of TIA, Anterior Cervical decompression/ discectomy/ fusion in the late 1990's   PRECAUTIONS: None  ONSET DATE: Four months ago  SUBJECTIVE: Patient reports high pain  today after reaching up into a cabinet, she's had difficulty completing HEP due to taking care of of mother with dementia.   PAIN:  Are you having pain? Yes: NPRS scale: 8/10 Pain location: Lt shoulder Pain description: Sharp, dull Aggravating factors: reaching certain directions, overhead movements, lifting any amount of weight, and turning in her sleep Relieving factors: pain cream, heating pad   OBJECTIVE: (objective measures completed at initial evaluation unless otherwise dated)   DIAGNOSTIC FINDINGS:  01/22/2022: DG Shoulder Lt: IMPRESSION: Mild-to-moderate acromioclavicular and mild  glenohumeral osteoarthritis.   PATIENT SURVEYS:  FOTO 55%, predicted 65% in 12 visits   COGNITION:           Overall cognitive status: Within functional limits for tasks assessed                                  SENSATION: Not tested   POSTURE: Forward shoulders, head   UPPER EXTREMITY ROM:    A/PROM Right eval Left eval  Shoulder flexion 170 148/ 160p!  Shoulder abduction 170 160p!, 168p!  Shoulder internal rotation 95 90/95p!  Shoulder external rotation 90 75/80p!  (Blank rows = not tested)   UPPER EXTREMITY MMT:   MMT Right eval Left eval  Shoulder flexion 4+/5 3+/5  Shoulder abduction 5/5 3+/5  Shoulder internal rotation 5/5 5/5  Shoulder external rotation 4+/5 3+/5  Middle trapezius 4/5 4/5  Lower trapezius 3+/5 Unable to assume testing positive due to pain  Latissimus dorsi 4+/5 4+/5  Elbow flexion 5/5 5/5  Elbow extension 5/5 5/5  Grip strength (lbs)      (Blank rows = not tested)   SHOULDER SPECIAL TESTS:            Hawkins-Kennedy: (+) on Lt            Neer's: (+) on Lt            Painful arc: (-) on Lt (Painful from roughly 140-end range)            Cross-body adduction test: (-) on Lt            O'Brien's: (+) on Lt   JOINT MOBILITY TESTING:  Hypomobile and painful in all planes with Lt GHJ passive accessory mobility testing vs Rt   PALPATION:  TTP to posterior shoulder capsule/ teres insertions             TODAY'S TREATMENT:  [facility] Adult PT Treatment:                                                DATE: 02/05/2022 Therapeutic Exercise: Flexion with towel MWM (mobilization with movement) 2x10 Shoulder scaption w/dowel x20 Shoulder ER w/dowel x20 IR stretch w/towel 2x30" Rows GTB 2x10 Shoulder extension RTB 2x10 IR/ER RTB 2x10 Lt Manual Therapy: PROM all motions Joint mobs level IV AP/Inf   02/02/2022: Demonstrated and issued HEP     PATIENT EDUCATION: Education details: Pt educated on POC, prognosis, potential underlying  pathophysiology, and HEP Person educated: Patient Education method: Explanation, Demonstration, and Handouts Education comprehension: verbalized understanding and returned demonstration     HOME EXERCISE PROGRAM: Access Code: [email or web address] URL: [email or web address] Date: 02/02/2022 Prepared by: Vanessa [place]   Exercises - Shoulder Scaption AAROM with Dowel (Mirrored)  - 2 x daily - 7  x weekly - 3 sets - 10 reps - 5 seconds hold - Standing Shoulder External Rotation AAROM with Dowel  - 2 x daily - 7 x weekly - 3 sets - 10 reps - 5 seconds hold - Standing Shoulder Internal Rotation Stretch with Towel  - 2 x daily - 7 x weekly - 3 sets - 10 reps - 5 seconds hold - Shoulder External Rotation and Scapular Retraction with Resistance  - 1 x daily - 7 x weekly - 3 sets - 10 reps - 5 seconds hold   ASSESSMENT:   CLINICAL IMPRESSION: Patient presents to PT with high levels of pain in her L shoulder and reports recent difficulty completing HEP due to barriers in her personal life. Session today focused on RTC ROM and strengthening and manual techniques to decrease tissue restriction. She requires occasional cues to depress shoulders as she tends to elevate them with activity. She is somewhat limited by pain throughout session but has excellent participation and is very motivated to decrease her pain. Patient continues to benefit from skilled PT services and should be progressed as able to improve functional independence.      OBJECTIVE IMPAIRMENTS decreased activity tolerance, decreased balance, decreased endurance, decreased mobility, decreased ROM, decreased strength, hypomobility, increased edema, impaired flexibility, impaired UE functional use, improper body mechanics, postural dysfunction, and pain.    ACTIVITY LIMITATIONS carrying, lifting, sleeping, bathing, dressing, hygiene/grooming, and caring for others   PARTICIPATION LIMITATIONS: meal prep, cleaning, laundry, driving,  shopping, community activity, occupation, and yard work   PERSONAL FACTORS 3+ comorbidities: See medical hx  are also affecting patient's functional outcome.    REHAB POTENTIAL: Good   CLINICAL DECISION MAKING: Stable/uncomplicated   EVALUATION COMPLEXITY: Low     GOALS: Goals reviewed with patient? Yes   SHORT TERM GOALS: Target date: 03/02/2022    Pt will report understanding and adherence to initial HEP in order to promote independence in the management of primary impairments. Baseline: HEP provided at eval Goal status: INITIAL     LONG TERM GOALS: Target date: 03/30/2022     Pt will achieve a FOTO score of 65% in order to demonstrate improved functional ability as it relates to her primary impairments. Baseline: 55% Goal status: INITIAL   2.  Pt will achieve Lt shoulder elevation AROM of 170 degrees in order to put away dishes into overhead cabinets with less limitation. Baseline: Flexion: 148d; Abduction: 160d Goal status: INITIAL   3.  Pt will report average pain at rest as 0-3/10 pain in order to sleep through the night with less limitation. Baseline: Resting pain average of 8-9/10 Goal status: INITIAL   4.  Pt will achieve global Lt shoulder MMT of 5/5 in order to progress to lifting weight with her Lt UE for her job demands. Baseline: See MMT chart Goal status: INITIAL       PLAN: PT FREQUENCY: 1x/week   PT DURATION: 8 weeks   PLANNED INTERVENTIONS: Therapeutic exercises, Therapeutic activity, Neuromuscular re-education, Patient/Family education, Self Care, Joint mobilization, Joint manipulation, Aquatic Therapy, Dry Needling, Electrical stimulation, Spinal manipulation, Spinal mobilization, Cryotherapy, Moist heat, Taping, Vasopneumatic device, Biofeedback, Ionotophoresis '4mg'$ /ml Dexamethasone, Manual therapy, and Re-evaluation   PLAN FOR NEXT SESSION: Progress shoulder/ parascapular strengthening, ROM, manual techniques for joint mobility     Margarette Canada, PTA 02/08/2022, 12:11 PM

## 2022-02-16 ENCOUNTER — Encounter (INDEPENDENT_AMBULATORY_CARE_PROVIDER_SITE_OTHER): Payer: Self-pay | Admitting: Primary Care

## 2022-02-16 ENCOUNTER — Ambulatory Visit: Payer: Medicare Other | Admitting: Physical Therapy

## 2022-02-16 ENCOUNTER — Ambulatory Visit (INDEPENDENT_AMBULATORY_CARE_PROVIDER_SITE_OTHER): Payer: Medicare Other | Admitting: Primary Care

## 2022-02-16 VITALS — BP 162/101 | HR 82 | Temp 98.5°F | Ht 61.0 in | Wt 113.0 lb

## 2022-02-16 DIAGNOSIS — R42 Dizziness and giddiness: Secondary | ICD-10-CM

## 2022-02-16 DIAGNOSIS — Z23 Encounter for immunization: Secondary | ICD-10-CM

## 2022-02-16 DIAGNOSIS — F4323 Adjustment disorder with mixed anxiety and depressed mood: Secondary | ICD-10-CM | POA: Diagnosis not present

## 2022-02-16 DIAGNOSIS — E119 Type 2 diabetes mellitus without complications: Secondary | ICD-10-CM | POA: Diagnosis not present

## 2022-02-16 LAB — POCT GLYCOSYLATED HEMOGLOBIN (HGB A1C): Hemoglobin A1C: 7.2 % — AB (ref 4.0–5.6)

## 2022-02-16 MED ORDER — DULOXETINE HCL 30 MG PO CPEP: 30.0000 mg | ORAL_CAPSULE | Freq: Two times a day (BID) | ORAL | 1 refills | Status: DC

## 2022-02-16 NOTE — Progress Notes (Unsigned)
Subjective:  Patient ID: [name], female    DOB: [birth year]  Age: 66 y.o. MRN: [id number]  CC: Diabetes   HPI [name] presents for follow-up of diabetes. Patient does not check blood sugar at home. Hypertension is also manage.  Today Bp elevated she states she is stressed over her mother and son. Patient has No headache, No chest pain, No abdominal pain - No Nausea, No new weakness tingling or numbness, No Cough - shortness of breath   Compliant with meds - Yes Checking CBGs? No  Fasting avg -   Postprandial average -  Exercising regularly? - Yes Watching carbohydrate intake? - Yes Neuropathy ? - No Hypoglycemic events - No  - Recovers with :   Pertinent ROS:  Polyuria - Yes Polydipsia - No Vision problems - No  Medications as noted below. Taking them regularly without complication/adverse reaction being reported today.   History [name] has a past medical history of Anemia, Aneurysm ([place]), Arthritis, Diabetes mellitus without complication ([place]), Hyperlipidemia, Hypertension, and TIA (transient ischemic attack).   She has a past surgical history that includes Cesarean section (1986); ir generic historical (09/21/2014); ir generic historical (09/21/2014); ir generic historical (09/21/2014); ir generic historical (09/21/2014); Laparoscopic cholecystectomy; Back surgery; Anterior cervical decomp/discectomy fusion; Tubal ligation; and Foot surgery (Left, 11/2019).   Her family history includes Arthritis in her mother; Dementia in her mother; Diabetes in her brother, mother, sister, and sister; Heart attack in her father; Heart disease in her brother, father, and sister; Hypertension in her brother, brother, brother, brother, mother, sister, and sister.She reports that she has never smoked. She has never used smokeless tobacco. She reports current alcohol use of about 3.0 standard drinks of alcohol per week. She reports that she does not currently use drugs.  Current  Outpatient Medications on File Prior to Visit  Medication Sig Dispense Refill   amLODipine (NORVASC) 10 MG tablet Take 1 tablet (10 mg total) by mouth daily. 90 tablet 1   gabapentin (NEURONTIN) 300 MG capsule 1 tab by mouth in the evening for 1 week, then 1 tab twice a day for 1 week, then 1 tab three times a day     losartan-hydrochlorothiazide (HYZAAR) 100-25 MG tablet Take 1 tablet by mouth daily. 90 tablet 3   meclizine (ANTIVERT) 12.5 MG tablet Take 1 tablet (12.5 mg total) by mouth 3 (three) times daily as needed for dizziness. 60 tablet 1   meloxicam (MOBIC) 15 MG tablet Take 15 mg by mouth daily.     methocarbamol (ROBAXIN) 500 MG tablet Take 1 tablet (500 mg total) by mouth every 8 (eight) hours as needed for muscle spasms. 20 tablet 0   metoprolol succinate (TOPROL-XL) 25 MG 24 hr tablet TAKE 1 TABLET (25 MG TOTAL) BY MOUTH DAILY. 90 tablet 1   naproxen (NAPROSYN) 375 MG tablet Take 1 tablet (375 mg total) by mouth 2 (two) times daily with a meal. 10 tablet 0   pantoprazole (PROTONIX) 40 MG tablet Take 1 tablet (40 mg total) by mouth daily. 90 tablet 0   sitaGLIPtin (JANUVIA) 25 MG tablet Take 1 tablet (25 mg total) by mouth daily. 90 tablet 1   No current facility-administered medications on file prior to visit.    ROS Comprehensive ROS Pertinent positive and negative noted in HPI    Objective:  BP (!) 162/101   Pulse 82   Temp 98.5 F (36.9 C) (Oral)   Ht '5\' 1"'$  (1.549 m)   Wt 113  lb (51.3 kg)   SpO2 100%   BMI 21.35 kg/m   BP Readings from Last 3 Encounters:  02/16/22 (!) 162/101  01/22/22 (!) 156/91  12/29/21 (!) 143/82    Wt Readings from Last 3 Encounters:  02/16/22 113 lb (51.3 kg)  01/22/22 117 lb (53.1 kg)  12/29/21 115 lb (52.2 kg)   Physical Exam General: No apparent distress. Eyes: Extraocular eye movements intact, pupils equal and round. Neck: Supple, trachea midline. Thyroid: No enlargement, mobile without fixation, no tenderness. Cardiovascular:  Regular rhythm and rate, no murmur, normal radial pulses. Respiratory: Normal respiratory effort, clear to auscultation. Gastrointestinal: Normal pitch active bowel sounds, nontender abdomen without distention or appreciable hepatomegaly. Musculoskeletal: Normal muscle tone, no tenderness on palpation of tibia, no excessive thoracic kyphosis. Skin: Appropriate warmth, no visible rash. Mental status: Alert, conversant, speech clear, thought logical, appropriate mood and affect, no hallucinations or delusions evident. Hematologic/lymphatic: No cervical adenopathy, no visible ecchymoses.  Lab Results  Component Value Date   HGBA1C 7.2 (A) 02/16/2022   HGBA1C 7.1 (A) 11/14/2021   HGBA1C 7.9 (A) 08/17/2021    Lab Results  Component Value Date   WBC 10.1 01/22/2022   HGB 12.4 01/22/2022   HCT 38.1 01/22/2022   PLT 346 01/22/2022   GLUCOSE 140 (H) 01/22/2022   CHOL 199 05/27/2020   TRIG 68 05/27/2020   HDL 73 05/27/2020   LDLCALC 114 (H) 05/27/2020   ALT 14 10/18/2021   AST 19 10/18/2021   NA 142 01/22/2022   K 4.0 01/22/2022   CL 105 01/22/2022   CREATININE 0.64 01/22/2022   BUN 15 01/22/2022   CO2 27 01/22/2022   TSH 1.900 01/29/2020   INR 0.95 06/19/2017   HGBA1C 7.2 (A) 02/16/2022     Assessment & Plan:   [name] was seen today for diabetes.  Diagnoses and all orders for this visit:  Type 2 diabetes mellitus without complication, without long-term current use of insulin (HCC) -     HgB A1c 7.2 previously 7.1. Continue to monitor foods that are high in carbohydrates are the following rice, potatoes, breads, sugars, and pastas.  Reduction in the intake (eating) will assist in lowering your blood sugars.   Need for prophylactic vaccination against Streptococcus pneumoniae (pneumococcus) -     Pneumococcal conjugate vaccine 20-valent (Prevnar 20)  Dizziness and giddiness -     Ambulatory referral to Neurology  Need for immunization against influenza -     Flu Vaccine  QUAD 6moIM (Fluarix, Fluzone & Alfiuria Quad PF)  Adjustment disorder with mixed anxiety and depressed mood Previously on Cymbalta '30mg'$  not working as well as it use too. Increase to Cymbalta '30mg'$  bid with the option can take both at the same time if not feeling fatigue or sleepy. Patient voiced understanding .  Other orders -     DULoxetine (CYMBALTA) 30 MG capsule; Take 1 capsule (30 mg total) by mouth 2 (two) times daily.   I have discontinued [name]'s DULoxetine. I am also having her start on DULoxetine. Additionally, I am having her maintain her amLODipine, losartan-hydrochlorothiazide, gabapentin, meloxicam, sitaGLIPtin, meclizine, metoprolol succinate, pantoprazole, methocarbamol, and naproxen.  Meds ordered this encounter  Medications   DULoxetine (CYMBALTA) 30 MG capsule    Sig: Take 1 capsule (30 mg total) by mouth 2 (two) times daily.    Dispense:  120 capsule    Refill:  1    Order Specific Question:   Supervising Provider    Answer:  Tresa Garter [[id number]]     Follow-up:   Return in about 7 weeks (around 04/06/2022) for Bp week.  The above assessment and management plan was discussed with the patient. The patient verbalized understanding of and has agreed to the management plan. Patient is aware to call the clinic if symptoms fail to improve or worsen. Patient is aware when to return to the clinic for a follow-up visit. Patient educated on when it is appropriate to go to the emergency department.   Juluis Mire, NP-C   Increase urination

## 2022-02-21 ENCOUNTER — Ambulatory Visit: Payer: Medicare Other | Admitting: Physical Therapy

## 2022-02-21 ENCOUNTER — Ambulatory Visit: Payer: Medicare Other | Admitting: Podiatry

## 2022-02-27 ENCOUNTER — Ambulatory Visit: Payer: Medicare Other | Admitting: Podiatry

## 2022-03-01 ENCOUNTER — Ambulatory Visit (INDEPENDENT_AMBULATORY_CARE_PROVIDER_SITE_OTHER): Payer: Medicare Other | Admitting: Primary Care

## 2022-03-02 ENCOUNTER — Ambulatory Visit: Payer: Medicare Other | Attending: Orthopedic Surgery

## 2022-03-02 DIAGNOSIS — M25512 Pain in left shoulder: Secondary | ICD-10-CM | POA: Diagnosis present

## 2022-03-02 DIAGNOSIS — G8929 Other chronic pain: Secondary | ICD-10-CM | POA: Diagnosis present

## 2022-03-02 DIAGNOSIS — M6281 Muscle weakness (generalized): Secondary | ICD-10-CM | POA: Insufficient documentation

## 2022-03-02 DIAGNOSIS — R600 Localized edema: Secondary | ICD-10-CM | POA: Insufficient documentation

## 2022-03-02 NOTE — Therapy (Signed)
OUTPATIENT PHYSICAL THERAPY TREATMENT NOTE   Patient Name: [name] MRN: [id number] DOB:1956-06-12, 66 y.o., female Today's Date: 03/02/2022  PCP: [name], NP REFERRING PROVIDER: Marchia Bond, MD  END OF SESSION:   PT End of Session - 03/02/22 1217     Visit Number 3    Number of Visits 9    Date for PT Re-Evaluation 04/06/22    Authorization Type UHC MCR    Authorization Time Period FOTO v6, v10, KX mod v15    Progress Note Due on Visit 10    PT Start Time 1216    PT Stop Time 1305   10 minutes vasopneumatic treatment   PT Time Calculation (min) 49 min    Activity Tolerance Patient tolerated treatment well    Behavior During Therapy WFL for tasks assessed/performed              Past Medical History:  Diagnosis Date   Anemia    hx   Aneurysm ([place])    small, on left side of brain    Arthritis    "all over" (06/19/2017)   Diabetes mellitus without complication ([place])    diet control, pt denies   Hyperlipidemia    Hypertension    TIA (transient ischemic attack)    pt unaware of this hx on 06/19/2017   Past Surgical History:  Procedure Laterality Date   ANTERIOR CERVICAL DECOMP/DISCECTOMY [place] Left 11/2019   IR GENERIC HISTORICAL  09/21/2014   IR ANGIO INTRA EXTRACRAN SEL INTERNAL CAROTID UNI L MOD SED 09/21/2014 [name], MD [facility]   IR GENERIC HISTORICAL  09/21/2014   IR ANGIO INTRA EXTRACRAN SEL COM CAROTID INNOMINATE UNI R MOD SED 09/21/2014 [name], MD [facility]   IR GENERIC HISTORICAL  09/21/2014   IR ANGIO VERTEBRAL SEL VERTEBRAL UNI L MOD SED 09/21/2014 [name], MD [facility]   IR GENERIC HISTORICAL  09/21/2014   IR 3D INDEPENDENT WKST 09/21/2014 [name], MD [facility]   LAPAROSCOPIC CHOLECYSTECTOMY     TUBAL LIGATION     Patient Active Problem List   Diagnosis Date Noted   Numbness and tingling of left hand 02/03/2021    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   Sepsis due to undetermined organism ([place]) 10/10/2020   Enteritis of infectious origin 10/09/2020   Diabetes mellitus type 2 in nonobese ([place]) 10/09/2020   Bilateral impacted cerumen 04/02/2019   Conductive hearing loss, bilateral 04/02/2019   Nausea and vomiting 05/17/2018   Enteritis 06/19/2017   Abdominal pain 06/18/2017   Hyperlipidemia 06/18/2017   Elevated glucose 06/18/2017   Need for hepatitis C screening test 05/23/2017   Breast pain, left 01/05/2016   Left ankle sprain 01/20/2015   Gastroenteritis 11/08/2014   Malaise 10/14/2014   Hypokalemia 10/14/2014   Essential hypertension    Bilateral wrist pain 10/06/2014   Hot flashes, menopausal 10/06/2014   Cerebral aneurysm without rupture 08/20/2014   TIA (transient ischemic attack) 08/19/2014   Accelerated hypertension 02/24/2013   Depression 02/24/2013    REFERRING DIAG: M25.519 (ICD-10-CM) - Shoulder pain  THERAPY DIAG:  Chronic left shoulder pain  Muscle weakness (generalized)  Localized edema  Rationale for Evaluation and Treatment Rehabilitation  PERTINENT HISTORY: HTN, DMII, arthritis, brain aneurysm, hx of TIA, Anterior Cervical decompression/ discectomy/ fusion in the late 1990's   PRECAUTIONS: None  ONSET DATE: Four months ago  SUBJECTIVE: Pt reports 5/10 pain today, adding that she is still limited in her ROM. Pt reports daily adherence to her HEP.  PAIN:  Are you having pain? Yes: NPRS scale: 5/10 Pain location: Lt shoulder Pain description: Sharp, dull Aggravating factors: reaching certain directions, overhead movements, lifting any amount of weight, and turning in her sleep Relieving factors: pain cream, heating pad   OBJECTIVE: (objective measures completed at initial evaluation unless otherwise dated)   DIAGNOSTIC FINDINGS:  01/22/2022: DG Shoulder Lt: IMPRESSION: Mild-to-moderate acromioclavicular and mild glenohumeral osteoarthritis.    PATIENT SURVEYS:  FOTO 55%, predicted 65% in 12 visits   COGNITION:           Overall cognitive status: Within functional limits for tasks assessed                                  SENSATION: Not tested   POSTURE: Forward shoulders, head   UPPER EXTREMITY ROM:    A/PROM Right eval Left eval Left 03/02/2022  Shoulder flexion 170 148/ 160p! 153  Shoulder abduction 170 160p!, 168p! 160  Shoulder internal rotation 95 90/95p!   Shoulder external rotation 90 75/80p!   (Blank rows = not tested)   UPPER EXTREMITY MMT:   MMT Right eval Left eval  Shoulder flexion 4+/5 3+/5  Shoulder abduction 5/5 3+/5  Shoulder internal rotation 5/5 5/5  Shoulder external rotation 4+/5 3+/5  Middle trapezius 4/5 4/5  Lower trapezius 3+/5 Unable to assume testing positive due to pain  Latissimus dorsi 4+/5 4+/5  Elbow flexion 5/5 5/5  Elbow extension 5/5 5/5  Grip strength (lbs)      (Blank rows = not tested)   SHOULDER SPECIAL TESTS:            Hawkins-Kennedy: (+) on Lt            Neer's: (+) on Lt            Painful arc: (-) on Lt (Painful from roughly 140-end range)            Cross-body adduction test: (-) on Lt            O'Brien's: (+) on Lt   JOINT MOBILITY TESTING:  Hypomobile and painful in all planes with Lt GHJ passive accessory mobility testing vs Rt   PALPATION:  TTP to posterior shoulder capsule/ teres insertions             TODAY'S TREATMENT:   [facility] Adult PT Treatment:                                                DATE: 03/02/2022 Therapeutic Exercise: UBE level 1 resistance x2 minutes forward, x2 minutes backward while collecting subjective information Standing UE ranger Lt shoulder flexion AAROM with mini horizontal adduction/ abduction at end range x10  Standing BIL shoulder scaption lifts with back on wall x10 with 2# dumbbells, 2x10 with 1# dumbbells Standing shoulder rolls x10 forward and backward Standing rows with two 3#  cables 3x10 Manual Therapy: Right  sidelying with Lt scapular protraction/flexion and retraction extension contract/ relax MET x5 with 30-sec hold at end range STM to Lt parascapular musculature in end-range protraction/ flexion x5 minutes Neuromuscular re-ed: N/A Therapeutic Activity: N/A Modalities: 10 minutes GameReady vasopneumatic treatment x10 minutes to  Lt shoulder  Self Care: N/A   [facility] Adult PT Treatment:                                                DATE: 02/05/2022 Therapeutic Exercise: Flexion with towel MWM (mobilization with movement) 2x10 Shoulder scaption w/dowel x20 Shoulder ER w/dowel x20 IR stretch w/towel 2x30" Rows GTB 2x10 Shoulder extension RTB 2x10 IR/ER RTB 2x10 Lt Manual Therapy: PROM all motions Joint mobs level IV AP/Inf   02/02/2022: Demonstrated and issued HEP     PATIENT EDUCATION: Education details: Pt educated on POC, prognosis, potential underlying pathophysiology, and HEP Person educated: Patient Education method: Explanation, Demonstration, and Handouts Education comprehension: verbalized understanding and returned demonstration     HOME EXERCISE PROGRAM: Access Code: [email or web address] URL: [email or web address] Date: 02/02/2022 Prepared by: Vanessa [place]   Exercises - Shoulder Scaption AAROM with Dowel (Mirrored)  - 2 x daily - 7 x weekly - 3 sets - 10 reps - 5 seconds hold - Standing Shoulder External Rotation AAROM with Dowel  - 2 x daily - 7 x weekly - 3 sets - 10 reps - 5 seconds hold - Standing Shoulder Internal Rotation Stretch with Towel  - 2 x daily - 7 x weekly - 3 sets - 10 reps - 5 seconds hold - Shoulder External Rotation and Scapular Retraction with Resistance  - 1 x daily - 7 x weekly - 3 sets - 10 reps - 5 seconds hold   ASSESSMENT:   CLINICAL IMPRESSION: Pt responded well to all progressed interventions today, demonstrating visually improved shoulder/ scapular mobility following MET. She also tolerated progressed exercise well. She will  continue to benefit from skilled PT to address her primary impairments and return to her prior level of function with less limitation.     OBJECTIVE IMPAIRMENTS decreased activity tolerance, decreased balance, decreased endurance, decreased mobility, decreased ROM, decreased strength, hypomobility, increased edema, impaired flexibility, impaired UE functional use, improper body mechanics, postural dysfunction, and pain.    ACTIVITY LIMITATIONS carrying, lifting, sleeping, bathing, dressing, hygiene/grooming, and caring for others   PARTICIPATION LIMITATIONS: meal prep, cleaning, laundry, driving, shopping, community activity, occupation, and yard work   PERSONAL FACTORS 3+ comorbidities: See medical hx  are also affecting patient's functional outcome.        GOALS: Goals reviewed with patient? Yes   SHORT TERM GOALS: Target date: 03/02/2022    Pt will report understanding and adherence to initial HEP in order to promote independence in the management of primary impairments. Baseline: HEP provided at eval 03/02/2022: Pt reports daily adherence to her HEP Goal status: ACHIEVED     LONG TERM GOALS: Target date: 03/30/2022     Pt will achieve a FOTO score of 65% in order to demonstrate improved functional ability as it relates to her primary impairments. Baseline: 55% Goal status: INITIAL   2.  Pt will achieve Lt shoulder elevation AROM of 170 degrees in order to put away dishes into overhead cabinets with less limitation. Baseline: Flexion: 148d; Abduction: 160d 03/02/2022: Flexion: 153d; Abduction: 160d Goal status: IN PROGRESS   3.  Pt will report average pain at rest as 0-3/10 pain in order to sleep through the night with less limitation. Baseline: Resting pain average of 8-9/10 Goal status: INITIAL   4.  Pt will achieve global Lt shoulder MMT of 5/5 in order to  progress to lifting weight with her Lt UE for her job demands. Baseline: See MMT chart Goal status: INITIAL        PLAN: PT FREQUENCY: 1x/week   PT DURATION: 8 weeks   PLANNED INTERVENTIONS: Therapeutic exercises, Therapeutic activity, Neuromuscular re-education, Patient/Family education, Self Care, Joint mobilization, Joint manipulation, Aquatic Therapy, Dry Needling, Electrical stimulation, Spinal manipulation, Spinal mobilization, Cryotherapy, Moist heat, Taping, Vasopneumatic device, Biofeedback, Ionotophoresis 48m/ml Dexamethasone, Manual therapy, and Re-evaluation   PLAN FOR NEXT SESSION: Progress shoulder/ parascapular strengthening, ROM, manual techniques for joint mobility     YVanessa Eden PT, DPT 03/02/22 1:41 PM

## 2022-03-07 ENCOUNTER — Other Ambulatory Visit (HOSPITAL_COMMUNITY): Payer: Self-pay

## 2022-03-09 ENCOUNTER — Ambulatory Visit: Payer: Medicare Other

## 2022-03-09 DIAGNOSIS — M6281 Muscle weakness (generalized): Secondary | ICD-10-CM

## 2022-03-09 DIAGNOSIS — M25512 Pain in left shoulder: Secondary | ICD-10-CM | POA: Diagnosis not present

## 2022-03-09 DIAGNOSIS — R600 Localized edema: Secondary | ICD-10-CM

## 2022-03-09 DIAGNOSIS — G8929 Other chronic pain: Secondary | ICD-10-CM

## 2022-03-09 NOTE — Therapy (Signed)
OUTPATIENT PHYSICAL THERAPY TREATMENT NOTE   Patient Name: [name] MRN: [id number] DOB:[birth year], 66 y.o., female Today's Date: 03/09/2022  PCP: [name], NP REFERRING PROVIDER: Marchia Bond, MD  END OF SESSION:   PT End of Session - 03/09/22 1218     Visit Number 4    Number of Visits 9    Date for PT Re-Evaluation 04/06/22    Authorization Type UHC MCR    Authorization Time Period FOTO v6, v10, KX mod v15    Progress Note Due on Visit 10    PT Start Time 1218    PT Stop Time 1258   10 minutes vasopneumatic treatment   PT Time Calculation (min) 40 min    Activity Tolerance Patient tolerated treatment well    Behavior During Therapy WFL for tasks assessed/performed               Past Medical History:  Diagnosis Date   Anemia    hx   Aneurysm ([place])    small, on left side of brain    Arthritis    "all over" (06/19/2017)   Diabetes mellitus without complication ([place])    diet control, pt denies   Hyperlipidemia    Hypertension    TIA (transient ischemic attack)    pt unaware of this hx on 06/19/2017   Past Surgical History:  Procedure Laterality Date   ANTERIOR CERVICAL DECOMP/DISCECTOMY FUSION     BACK SURGERY     CESAREAN SECTION  1986   FOOT SURGERY Left 11/2019   IR GENERIC HISTORICAL  09/21/2014   IR ANGIO INTRA EXTRACRAN SEL INTERNAL CAROTID UNI L MOD SED 09/21/2014 [name], MD [facility]   IR GENERIC HISTORICAL  09/21/2014   IR ANGIO INTRA EXTRACRAN SEL COM CAROTID INNOMINATE UNI R MOD SED 09/21/2014 [name], MD [facility]   IR GENERIC HISTORICAL  09/21/2014   IR ANGIO VERTEBRAL SEL VERTEBRAL UNI L MOD SED 09/21/2014 [name], MD [facility]   IR GENERIC HISTORICAL  09/21/2014   IR 3D INDEPENDENT WKST 09/21/2014 [name], MD [facility]   LAPAROSCOPIC CHOLECYSTECTOMY     TUBAL LIGATION     Patient Active Problem List   Diagnosis Date Noted   Numbness and tingling of left hand  02/03/2021   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   Sepsis due to undetermined organism ([place]) 10/10/2020   Enteritis of infectious origin 10/09/2020   Diabetes mellitus type 2 in nonobese ([place]) 10/09/2020   Bilateral impacted cerumen 04/02/2019   Conductive hearing loss, bilateral 04/02/2019   Nausea and vomiting 05/17/2018   Enteritis 06/19/2017   Abdominal pain 06/18/2017   Hyperlipidemia 06/18/2017   Elevated glucose 06/18/2017   Need for hepatitis C screening test 05/23/2017   Breast pain, left 01/05/2016   Left ankle sprain 01/20/2015   Gastroenteritis 11/08/2014   Malaise 10/14/2014   Hypokalemia 10/14/2014   Essential hypertension    Bilateral wrist pain 10/06/2014   Hot flashes, menopausal 10/06/2014   Cerebral aneurysm without rupture 08/20/2014   TIA (transient ischemic attack) 08/19/2014   Accelerated hypertension 02/24/2013   Depression 02/24/2013    REFERRING DIAG: M25.519 (ICD-10-CM) - Shoulder pain  THERAPY DIAG:  Chronic left shoulder pain  Muscle weakness (generalized)  Localized edema  Rationale for Evaluation and Treatment Rehabilitation  PERTINENT HISTORY: HTN, DMII, arthritis, brain aneurysm, hx of TIA, Anterior Cervical decompression/ discectomy/ fusion in the late 1990's   PRECAUTIONS: None  ONSET DATE: Four months  ago  SUBJECTIVE: Pt reports increased pain today after a long day yesterday caring for her mother. She reports being adherent to her HEP, although she could not participate yesterday.   PAIN:  Are you having pain? Yes: NPRS scale: 6/10 Pain location: Lt shoulder Pain description: Sharp, dull Aggravating factors: reaching certain directions, overhead movements, lifting any amount of weight, and turning in her sleep Relieving factors: pain cream, heating pad   OBJECTIVE: (objective measures completed at initial evaluation unless otherwise dated)   DIAGNOSTIC FINDINGS:  01/22/2022: DG Shoulder Lt:  IMPRESSION: Mild-to-moderate acromioclavicular and mild glenohumeral osteoarthritis.   PATIENT SURVEYS:  FOTO 55%, predicted 65% in 12 visits   COGNITION:           Overall cognitive status: Within functional limits for tasks assessed                                  SENSATION: Not tested   POSTURE: Forward shoulders, head   UPPER EXTREMITY ROM:    A/PROM Right eval Left eval Left 03/02/2022  Shoulder flexion 170 148/ 160p! 153  Shoulder abduction 170 160p!, 168p! 160  Shoulder internal rotation 95 90/95p!   Shoulder external rotation 90 75/80p!   (Blank rows = not tested)   UPPER EXTREMITY MMT:   MMT Right eval Left eval  Shoulder flexion 4+/5 3+/5  Shoulder abduction 5/5 3+/5  Shoulder internal rotation 5/5 5/5  Shoulder external rotation 4+/5 3+/5  Middle trapezius 4/5 4/5  Lower trapezius 3+/5 Unable to assume testing positive due to pain  Latissimus dorsi 4+/5 4+/5  Elbow flexion 5/5 5/5  Elbow extension 5/5 5/5  Grip strength (lbs)      (Blank rows = not tested)   SHOULDER SPECIAL TESTS:            Hawkins-Kennedy: (+) on Lt            Neer's: (+) on Lt            Painful arc: (-) on Lt (Painful from roughly 140-end range)            Cross-body adduction test: (-) on Lt            O'Brien's: (+) on Lt   JOINT MOBILITY TESTING:  Hypomobile and painful in all planes with Lt GHJ passive accessory mobility testing vs Rt   PALPATION:  TTP to posterior shoulder capsule/ teres insertions             TODAY'S TREATMENT:   [facility] Adult PT Treatment:                                                DATE: 03/09/2022 Therapeutic Exercise: Standing UE Ranger Lt shoulder flexion AAROM x10 with 10-sec hold at end range Standing UE Ranger Lt shoulder abduction AAROM x10 with 10-sec hold at end range Sidelying Lt shoulder ER with 3# dumbbell 3x10 Standing BIL shoulder scaption lifts with back to wall x10 with 2# dumbbells, x10 with 1# dumbbells, x10 unweighted Standing  arm circles by side 2x10 forward and backward Seated low rows with 20# 2x10 Seated lat pull-down with 15# 2x10 Manual Therapy: N/A Neuromuscular re-ed: N/A Therapeutic Activity: N/A Modalities: N/A Self Care: N/A   [facility] Adult PT Treatment:  DATE: 03/02/2022 Therapeutic Exercise: UBE level 1 resistance x2 minutes forward, x2 minutes backward while collecting subjective information Standing UE ranger Lt shoulder flexion AAROM with mini horizontal adduction/ abduction at end range x10  Standing BIL shoulder scaption lifts with back on wall x10 with 2# dumbbells, 2x10 with 1# dumbbells Standing shoulder rolls x10 forward and backward Standing rows with two 3#  cables 3x10 Manual Therapy: Right sidelying with Lt scapular protraction/flexion and retraction extension contract/ relax MET x5 with 30-sec hold at end range STM to Lt parascapular musculature in end-range protraction/ flexion x5 minutes Neuromuscular re-ed: N/A Therapeutic Activity: N/A Modalities: 10 minutes GameReady vasopneumatic treatment x10 minutes to Lt shoulder  Self Care: N/A   [facility] Adult PT Treatment:                                                DATE: 02/05/2022 Therapeutic Exercise: Flexion with towel MWM (mobilization with movement) 2x10 Shoulder scaption w/dowel x20 Shoulder ER w/dowel x20 IR stretch w/towel 2x30" Rows GTB 2x10 Shoulder extension RTB 2x10 IR/ER RTB 2x10 Lt Manual Therapy: PROM all motions Joint mobs level IV AP/Inf       PATIENT EDUCATION: Education details: Pt educated on POC, prognosis, potential underlying pathophysiology, and HEP Person educated: Patient Education method: Consulting civil engineer, Media planner, and Handouts Education comprehension: verbalized understanding and returned demonstration     HOME EXERCISE PROGRAM: Access Code: [email or web address] URL: [email or web address] Date: 02/02/2022 Prepared by: Vanessa [place]   Exercises - Shoulder Scaption AAROM with Dowel (Mirrored)  - 2 x daily - 7 x weekly - 3 sets - 10 reps - 5 seconds hold - Standing Shoulder External Rotation AAROM with Dowel  - 2 x daily - 7 x weekly - 3 sets - 10 reps - 5 seconds hold - Standing Shoulder Internal Rotation Stretch with Towel  - 2 x daily - 7 x weekly - 3 sets - 10 reps - 5 seconds hold - Shoulder External Rotation and Scapular Retraction with Resistance  - 1 x daily - 7 x weekly - 3 sets - 10 reps - 5 seconds hold   ASSESSMENT:   CLINICAL IMPRESSION: Pt continues to respond well to progressed exercises today, demonstrating good form and reporting decreased pain at the end of the session. She will continue to benefit form skilled PT to address her primary impairments and return to her prior level of function with less limitation.      OBJECTIVE IMPAIRMENTS decreased activity tolerance, decreased balance, decreased endurance, decreased mobility, decreased ROM, decreased strength, hypomobility, increased edema, impaired flexibility, impaired UE functional use, improper body mechanics, postural dysfunction, and pain.    ACTIVITY LIMITATIONS carrying, lifting, sleeping, bathing, dressing, hygiene/grooming, and caring for others   PARTICIPATION LIMITATIONS: meal prep, cleaning, laundry, driving, shopping, community activity, occupation, and yard work   PERSONAL FACTORS 3+ comorbidities: See medical hx  are also affecting patient's functional outcome.        GOALS: Goals reviewed with patient? Yes   SHORT TERM GOALS: Target date: 03/02/2022    Pt will report understanding and adherence to initial HEP in order to promote independence in the management of primary impairments. Baseline: HEP provided at eval 03/02/2022: Pt reports daily adherence to her HEP Goal status: ACHIEVED     LONG TERM GOALS: Target date: 03/30/2022     Pt will achieve a FOTO score  of 65% in order to demonstrate improved functional ability  as it relates to her primary impairments. Baseline: 55% Goal status: INITIAL   2.  Pt will achieve Lt shoulder elevation AROM of 170 degrees in order to put away dishes into overhead cabinets with less limitation. Baseline: Flexion: 148d; Abduction: 160d 03/02/2022: Flexion: 153d; Abduction: 160d Goal status: IN PROGRESS   3.  Pt will report average pain at rest as 0-3/10 pain in order to sleep through the night with less limitation. Baseline: Resting pain average of 8-9/10 Goal status: INITIAL   4.  Pt will achieve global Lt shoulder MMT of 5/5 in order to progress to lifting weight with her Lt UE for her job demands. Baseline: See MMT chart Goal status: INITIAL       PLAN: PT FREQUENCY: 1x/week   PT DURATION: 8 weeks   PLANNED INTERVENTIONS: Therapeutic exercises, Therapeutic activity, Neuromuscular re-education, Patient/Family education, Self Care, Joint mobilization, Joint manipulation, Aquatic Therapy, Dry Needling, Electrical stimulation, Spinal manipulation, Spinal mobilization, Cryotherapy, Moist heat, Taping, Vasopneumatic device, Biofeedback, Ionotophoresis 29m/ml Dexamethasone, Manual therapy, and Re-evaluation   PLAN FOR NEXT SESSION: Progress shoulder/ parascapular strengthening, ROM, manual techniques for joint mobility     YVanessa [place] PT, DPT 03/09/22 12:59 PM

## 2022-03-12 ENCOUNTER — Ambulatory Visit (INDEPENDENT_AMBULATORY_CARE_PROVIDER_SITE_OTHER): Payer: Medicare Other | Admitting: Podiatry

## 2022-03-12 ENCOUNTER — Encounter: Payer: Self-pay | Admitting: Podiatry

## 2022-03-12 DIAGNOSIS — M79675 Pain in left toe(s): Secondary | ICD-10-CM

## 2022-03-12 DIAGNOSIS — M79674 Pain in right toe(s): Secondary | ICD-10-CM

## 2022-03-12 DIAGNOSIS — B351 Tinea unguium: Secondary | ICD-10-CM

## 2022-03-12 DIAGNOSIS — L84 Corns and callosities: Secondary | ICD-10-CM

## 2022-03-12 DIAGNOSIS — E119 Type 2 diabetes mellitus without complications: Secondary | ICD-10-CM

## 2022-03-12 NOTE — Progress Notes (Signed)
  Subjective:  Patient ID: [name], female    DOB: 11-Apr-1956,   MRN: [id number]  Chief Complaint  Patient presents with   Foot Problem    RFC    66 y.o. female presents for concern of thickened elongated and painful nails that are difficult to trim. Requesting to have them trimmed today. Relates burning and tingling in their feet. Relates hallux on right is still doing well. Also hoping to have callus trimmed as well. Patient is diabetic and last A1c was  Lab Results  Component Value Date   HGBA1C 7.2 (A) 02/16/2022   .   PCP:  [name], NP    . Denies any other pedal complaints. Denies n/v/f/c.   Past Medical History:  Diagnosis Date   Anemia    hx   Aneurysm ([place])    small, on left side of brain    Arthritis    "all over" (06/19/2017)   Diabetes mellitus without complication ([facility])    diet control, pt denies   Hyperlipidemia    Hypertension    TIA (transient ischemic attack)    pt unaware of this hx on 06/19/2017    Objective:  Physical Exam: Vascular: DP/PT pulses 2/4 bilateral. CFT <3 seconds. Absent hair growth on digits. Edema noted to bilateral lower extremities. Xerosis noted bilaterally.  Skin. No lacerations or abrasions bilateral feet. Nails 1-5 bilateral  are thickened discolored and elongated with subungual debris. Hyperkeratotic tissue noted left medial hallux.    Musculoskeletal: MMT 5/5 bilateral lower extremities in DF, PF, Inversion and Eversion. Deceased ROM in DF of ankle joint. 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 Tender to hallux IPJ. No pain around MPJ or lesser digits.  Neurological: Sensation intact to light touch. Protective sensation diminished bilateral.    Assessment:   1. Pain due to onychomycosis of toenails of both feet   2. Diabetes mellitus type 2 in nonobese (HCC)   3. Corns and callosities      Plan:  Patient was evaluated and treated and all questions answered. -Discussed and educated patient  on diabetic foot care, especially with  regards to the vascular, neurological and musculoskeletal systems.  -Stressed the importance of good glycemic control and the detriment of not  controlling glucose levels in relation to the foot. -Discussed supportive shoes at all times and checking feet regularly.   -Mechanically debrided all nails 1-5 bilateral using sterile nail nipper and filed with dremel without incident  -Hyperkeratotic tissue debrided without incident.  -Discussed again if not having pain in hammertoes then no need for surgery at this point.  -Answered all patient questions -Patient to return  in 3 months for at risk foot care -Patient advised to call the office if any problems or questions arise in the meantime.   Lorenda Peck, DPM

## 2022-03-21 ENCOUNTER — Encounter: Payer: Self-pay | Admitting: Physical Therapy

## 2022-03-21 ENCOUNTER — Ambulatory Visit: Payer: Medicare Other | Admitting: Physical Therapy

## 2022-03-21 DIAGNOSIS — M6281 Muscle weakness (generalized): Secondary | ICD-10-CM

## 2022-03-21 DIAGNOSIS — M25512 Pain in left shoulder: Secondary | ICD-10-CM | POA: Diagnosis not present

## 2022-03-21 DIAGNOSIS — R600 Localized edema: Secondary | ICD-10-CM

## 2022-03-21 DIAGNOSIS — G8929 Other chronic pain: Secondary | ICD-10-CM

## 2022-03-21 NOTE — Therapy (Signed)
OUTPATIENT PHYSICAL THERAPY TREATMENT NOTE   Patient Name: [name] MRN: [id number] DOB:1955/08/15, 66 y.o., female Today's Date: 03/21/2022  PCP: [name], NP REFERRING PROVIDER: Marchia Bond, MD  END OF SESSION:   PT End of Session - 03/21/22 1209     Visit Number 5    Number of Visits 9    Date for PT Re-Evaluation 04/06/22    Authorization Type UHC MCR    Authorization Time Period FOTO v6, v10, KX mod v15    Progress Note Due on Visit 10    PT Start Time 1215    PT Stop Time 1256    PT Time Calculation (min) 41 min    Activity Tolerance Patient tolerated treatment well    Behavior During Therapy WFL for tasks assessed/performed               Past Medical History:  Diagnosis Date   Anemia    hx   Aneurysm ([place])    small, on left side of brain    Arthritis    "all over" (06/19/2017)   Diabetes mellitus without complication ([place])    diet control, pt denies   Hyperlipidemia    Hypertension    TIA (transient ischemic attack)    pt unaware of this hx on 06/19/2017   Past Surgical History:  Procedure Laterality Date   ANTERIOR CERVICAL DECOMP/DISCECTOMY [place] Left 11/2019   IR GENERIC HISTORICAL  09/21/2014   IR ANGIO INTRA EXTRACRAN SEL INTERNAL CAROTID UNI L MOD SED 09/21/2014 [name], MD [facility]   IR GENERIC HISTORICAL  09/21/2014   IR ANGIO INTRA EXTRACRAN SEL COM CAROTID INNOMINATE UNI R MOD SED 09/21/2014 [name], MD [facility]   IR GENERIC HISTORICAL  09/21/2014   IR ANGIO VERTEBRAL SEL VERTEBRAL UNI L MOD SED 09/21/2014 [name], [facility]   IR GENERIC HISTORICAL  09/21/2014   IR 3D INDEPENDENT WKST 09/21/2014 [name], MD [facility]   LAPAROSCOPIC CHOLECYSTECTOMY     TUBAL LIGATION     Patient Active Problem List   Diagnosis Date Noted   Numbness and tingling of left hand 02/03/2021   Carpal tunnel syndrome of left  wrist 02/03/2021   Sepsis due to undetermined organism ([place]) 10/10/2020   Enteritis of infectious origin 10/09/2020   Diabetes mellitus type 2 in nonobese ([place]) 10/09/2020   Bilateral impacted cerumen 04/02/2019   Conductive hearing loss, bilateral 04/02/2019   Nausea and vomiting 05/17/2018   Enteritis 06/19/2017   Abdominal pain 06/18/2017   Hyperlipidemia 06/18/2017   Elevated glucose 06/18/2017   Need for hepatitis C screening test 05/23/2017   Breast pain, left 01/05/2016   Left ankle sprain 01/20/2015   Gastroenteritis 11/08/2014   Malaise 10/14/2014   Hypokalemia 10/14/2014   Essential hypertension    Bilateral wrist pain 10/06/2014   Hot flashes, menopausal 10/06/2014   Cerebral aneurysm without rupture 08/20/2014   TIA (transient ischemic attack) 08/19/2014   Accelerated hypertension 02/24/2013   Depression 02/24/2013    REFERRING DIAG: M25.519 (ICD-10-CM) - Shoulder pain  THERAPY DIAG:  Chronic left shoulder pain  Muscle weakness (generalized)  Localized edema  Rationale for Evaluation and Treatment Rehabilitation  PERTINENT HISTORY: HTN, DMII, arthritis, brain aneurysm, hx of TIA, Anterior Cervical decompression/ discectomy/ fusion in the late 1990's   PRECAUTIONS: None  ONSET DATE: Four months ago  SUBJECTIVE: Pt reports  that her shoulder and carpel tunnel are acting up today.  PAIN:  Are you having pain? Yes: NPRS scale: 7/10 Pain location: Lt shoulder Pain description: Sharp, dull Aggravating factors: reaching certain directions, overhead movements, lifting any amount of weight, and turning in her sleep Relieving factors: pain cream, heating pad   OBJECTIVE: (objective measures completed at initial evaluation unless otherwise dated)   DIAGNOSTIC FINDINGS:  01/22/2022: DG Shoulder Lt: IMPRESSION: Mild-to-moderate acromioclavicular and mild glenohumeral osteoarthritis.   PATIENT SURVEYS:  FOTO 55%, predicted 65% in 12 visits   COGNITION:            Overall cognitive status: Within functional limits for tasks assessed                                  SENSATION: Not tested   POSTURE: Forward shoulders, head   UPPER EXTREMITY ROM:    A/PROM Right eval Left eval Left 03/02/2022  Shoulder flexion 170 148/ 160p! 153  Shoulder abduction 170 160p!, 168p! 160  Shoulder internal rotation 95 90/95p!   Shoulder external rotation 90 75/80p!   (Blank rows = not tested)   UPPER EXTREMITY MMT:   MMT Right eval Left eval  Shoulder flexion 4+/5 3+/5  Shoulder abduction 5/5 3+/5  Shoulder internal rotation 5/5 5/5  Shoulder external rotation 4+/5 3+/5  Middle trapezius 4/5 4/5  Lower trapezius 3+/5 Unable to assume testing positive due to pain  Latissimus dorsi 4+/5 4+/5  Elbow flexion 5/5 5/5  Elbow extension 5/5 5/5  Grip strength (lbs)      (Blank rows = not tested)   SHOULDER SPECIAL TESTS:            Hawkins-Kennedy: (+) on Lt            Neer's: (+) on Lt            Painful arc: (-) on Lt (Painful from roughly 140-end range)            Cross-body adduction test: (-) on Lt            O'Brien's: (+) on Lt   JOINT MOBILITY TESTING:  Hypomobile and painful in all planes with Lt GHJ passive accessory mobility testing vs Rt   PALPATION:  TTP to posterior shoulder capsule/ teres insertions             TODAY'S TREATMENT:    [facility] Adult PT Treatment:                                                DATE: 03/21/2022 Therapeutic Exercise: UBE - 2' fwd/2' bkwd for warm up while taking subjective Standing UE Ranger Lt shoulder flexion AAROM x20 '@25' '' Standing UE Ranger Lt shoulder abduction AAROM x20 '@25' '' Sidelying Lt shoulder ER with 3# dumbbell 3x12 Standing BIL shoulder scaption lifts with back to wall 3x10 Standing arm circles by side 2x10 forward and backward GTB row - 3x20 RTB shoulder ext - 3x20  [facility] Adult PT Treatment:                                                DATE: 03/09/2022 Therapeutic Exercise: Standing  UE Ranger Lt shoulder flexion AAROM x10 with 10-sec hold at end range Standing UE Ranger Lt shoulder abduction AAROM x10 with 10-sec hold at end range Sidelying Lt shoulder ER with 3# dumbbell 3x10 Standing BIL shoulder scaption lifts with back to wall x10 with 2# dumbbells, x10 with 1# dumbbells, x10 unweighted Standing arm circles by side 2x10 forward and backward Seated low rows with 20# 2x10 Seated lat pull-down with 15# 2x10 Manual Therapy: N/A Neuromuscular re-ed: N/A Therapeutic Activity: N/A Modalities: N/A Self Care: N/A   [facility] Adult PT Treatment:                                                DATE: 03/02/2022 Therapeutic Exercise: UBE level 1 resistance x2 minutes forward, x2 minutes backward while collecting subjective information Standing UE ranger Lt shoulder flexion AAROM with mini horizontal adduction/ abduction at end range x10  Standing BIL shoulder scaption lifts with back on wall x10 with 2# dumbbells, 2x10 with 1# dumbbells Standing shoulder rolls x10 forward and backward Standing rows with two 3#  cables 3x10 Manual Therapy: Right sidelying with Lt scapular protraction/flexion and retraction extension contract/ relax MET x5 with 30-sec hold at end range STM to Lt parascapular musculature in end-range protraction/ flexion x5 minutes Neuromuscular re-ed: N/A Therapeutic Activity: N/A Modalities: 10 minutes GameReady vasopneumatic treatment x10 minutes to Lt shoulder  Self Care: N/A   [facility] Adult PT Treatment:                                                DATE: 02/05/2022 Therapeutic Exercise: Flexion with towel MWM (mobilization with movement) 2x10 Shoulder scaption w/dowel x20 Shoulder ER w/dowel x20 IR stretch w/towel 2x30" Rows GTB 2x10 Shoulder extension RTB 2x10 IR/ER RTB 2x10 Lt Manual Therapy: PROM all motions Joint mobs level IV AP/Inf       PATIENT EDUCATION: Education details: Pt educated on POC, prognosis, potential underlying  pathophysiology, and HEP Person educated: Patient Education method: Consulting civil engineer, Media planner, and Handouts Education comprehension: verbalized understanding and returned demonstration     HOME EXERCISE PROGRAM: Access Code: [email or web address] URL: [email or web address] Date: 02/02/2022 Prepared by: Vanessa Burt   Exercises - Shoulder Scaption AAROM with Dowel (Mirrored)  - 2 x daily - 7 x weekly - 3 sets - 10 reps - 5 seconds hold - Standing Shoulder External Rotation AAROM with Dowel  - 2 x daily - 7 x weekly - 3 sets - 10 reps - 5 seconds hold - Standing Shoulder Internal Rotation Stretch with Towel  - 2 x daily - 7 x weekly - 3 sets - 10 reps - 5 seconds hold - Shoulder External Rotation and Scapular Retraction with Resistance  - 1 x daily - 7 x weekly - 3 sets - 10 reps - 5 seconds hold   ASSESSMENT:   CLINICAL IMPRESSION: Jamillia tolerated session well with no adverse reaction.  We continued to work on AROM in combination with persicapular and R/C strengthening.  Higher baseline pain today, but Lillyth was able to complete all exercises with good form.  Will continue to progress as able.  She reports reduction in sxs following therapy.     OBJECTIVE IMPAIRMENTS decreased activity tolerance, decreased balance, decreased endurance, decreased  mobility, decreased ROM, decreased strength, hypomobility, increased edema, impaired flexibility, impaired UE functional use, improper body mechanics, postural dysfunction, and pain.    ACTIVITY LIMITATIONS carrying, lifting, sleeping, bathing, dressing, hygiene/grooming, and caring for others   PARTICIPATION LIMITATIONS: meal prep, cleaning, laundry, driving, shopping, community activity, occupation, and yard work   PERSONAL FACTORS 3+ comorbidities: See medical hx  are also affecting patient's functional outcome.        GOALS: Goals reviewed with patient? Yes   SHORT TERM GOALS: Target date: 03/02/2022    Pt will report  understanding and adherence to initial HEP in order to promote independence in the management of primary impairments. Baseline: HEP provided at eval 03/02/2022: Pt reports daily adherence to her HEP Goal status: ACHIEVED     LONG TERM GOALS: Target date: 03/30/2022     Pt will achieve a FOTO score of 65% in order to demonstrate improved functional ability as it relates to her primary impairments. Baseline: 55% Goal status: INITIAL   2.  Pt will achieve Lt shoulder elevation AROM of 170 degrees in order to put away dishes into overhead cabinets with less limitation. Baseline: Flexion: 148d; Abduction: 160d 03/02/2022: Flexion: 153d; Abduction: 160d Goal status: IN PROGRESS   3.  Pt will report average pain at rest as 0-3/10 pain in order to sleep through the night with less limitation. Baseline: Resting pain average of 8-9/10 Goal status: INITIAL   4.  Pt will achieve global Lt shoulder MMT of 5/5 in order to progress to lifting weight with her Lt UE for her job demands. Baseline: See MMT chart Goal status: INITIAL       PLAN: PT FREQUENCY: 1x/week   PT DURATION: 8 weeks   PLANNED INTERVENTIONS: Therapeutic exercises, Therapeutic activity, Neuromuscular re-education, Patient/Family education, Self Care, Joint mobilization, Joint manipulation, Aquatic Therapy, Dry Needling, Electrical stimulation, Spinal manipulation, Spinal mobilization, Cryotherapy, Moist heat, Taping, Vasopneumatic device, Biofeedback, Ionotophoresis 43m/ml Dexamethasone, Manual therapy, and Re-evaluation   PLAN FOR NEXT SESSION: Progress shoulder/ parascapular strengthening, ROM, manual techniques for joint mobility     KMathis DadPT 03/21/22 12:57 PM

## 2022-03-27 ENCOUNTER — Ambulatory Visit: Payer: Medicare Other | Attending: Orthopedic Surgery

## 2022-03-27 DIAGNOSIS — R600 Localized edema: Secondary | ICD-10-CM | POA: Insufficient documentation

## 2022-03-27 DIAGNOSIS — G8929 Other chronic pain: Secondary | ICD-10-CM | POA: Insufficient documentation

## 2022-03-27 DIAGNOSIS — M6281 Muscle weakness (generalized): Secondary | ICD-10-CM | POA: Insufficient documentation

## 2022-03-27 DIAGNOSIS — M25512 Pain in left shoulder: Secondary | ICD-10-CM | POA: Diagnosis not present

## 2022-03-27 NOTE — Therapy (Signed)
OUTPATIENT PHYSICAL THERAPY TREATMENT NOTE   Patient Name: [name] MRN: [id number] DOB:[birth year], 66 y.o., female Today's Date: 03/27/2022  PCP: [name], NP REFERRING PROVIDER: Marchia Bond, MD  END OF SESSION:   PT End of Session - 03/27/22 1215     Visit Number 6    Number of Visits 9    Date for PT Re-Evaluation 04/06/22    Authorization Type UHC MCR    Authorization Time Period FOTO v6, v10, KX mod v15    Progress Note Due on Visit 10    PT Start Time 1216    PT Stop Time 1255    PT Time Calculation (min) 39 min    Activity Tolerance Patient tolerated treatment well    Behavior During Therapy WFL for tasks assessed/performed                Past Medical History:  Diagnosis Date   Anemia    hx   Aneurysm ([place])    small, on left side of brain    Arthritis    "all over" (06/19/2017)   Diabetes mellitus without complication ([place])    diet control, pt denies   Hyperlipidemia    Hypertension    TIA (transient ischemic attack)    pt unaware of this hx on 06/19/2017   Past Surgical History:  Procedure Laterality Date   ANTERIOR CERVICAL DECOMP/DISCECTOMY [place] Left 11/2019   IR GENERIC HISTORICAL  09/21/2014   IR ANGIO INTRA EXTRACRAN SEL INTERNAL CAROTID UNI L MOD SED 09/21/2014 [name], [facility]   IR GENERIC HISTORICAL  09/21/2014   IR ANGIO INTRA EXTRACRAN SEL COM CAROTID INNOMINATE UNI R MOD SED 09/21/2014 [name], MD [facility]   IR GENERIC HISTORICAL  09/21/2014   IR ANGIO VERTEBRAL SEL VERTEBRAL UNI L MOD SED 09/21/2014 [name], MD [facility]   IR GENERIC HISTORICAL  09/21/2014   IR 3D INDEPENDENT WKST 09/21/2014 [name], MD [facility]   LAPAROSCOPIC CHOLECYSTECTOMY     TUBAL LIGATION     Patient Active Problem List   Diagnosis Date Noted   Numbness and tingling of left hand 02/03/2021   Carpal tunnel syndrome of  left wrist 02/03/2021   Sepsis due to undetermined organism ([place]) 10/10/2020   Enteritis of infectious origin 10/09/2020   Diabetes mellitus type 2 in nonobese ([place]) 10/09/2020   Bilateral impacted cerumen 04/02/2019   Conductive hearing loss, bilateral 04/02/2019   Nausea and vomiting 05/17/2018   Enteritis 06/19/2017   Abdominal pain 06/18/2017   Hyperlipidemia 06/18/2017   Elevated glucose 06/18/2017   Need for hepatitis C screening test 05/23/2017   Breast pain, left 01/05/2016   Left ankle sprain 01/20/2015   Gastroenteritis 11/08/2014   Malaise 10/14/2014   Hypokalemia 10/14/2014   Essential hypertension    Bilateral wrist pain 10/06/2014   Hot flashes, menopausal 10/06/2014   Cerebral aneurysm without rupture 08/20/2014   TIA (transient ischemic attack) 08/19/2014   Accelerated hypertension 02/24/2013   Depression 02/24/2013    REFERRING DIAG: M25.519 (ICD-10-CM) - Shoulder pain  THERAPY DIAG:  Chronic left shoulder pain  Muscle weakness (generalized)  Localized edema  Rationale for Evaluation and Treatment Rehabilitation  PERTINENT HISTORY: HTN, DMII, arthritis, brain aneurysm, hx of TIA, Anterior Cervical decompression/ discectomy/ fusion in the late 1990's   PRECAUTIONS: None  ONSET DATE: Four months ago  SUBJECTIVE: Pt  reports she feels well today, adding that she has noticed continued improvement in her shoulder pain. She reports varied adherence to her HEP.  PAIN:  Are you having pain? Yes: NPRS scale: 0/10 Pain location: Lt shoulder Pain description: Sharp, dull Aggravating factors: reaching certain directions, overhead movements, lifting any amount of weight, and turning in her sleep Relieving factors: pain cream, heating pad   OBJECTIVE: (objective measures completed at initial evaluation unless otherwise dated)   DIAGNOSTIC FINDINGS:  01/22/2022: DG Shoulder Lt: IMPRESSION: Mild-to-moderate acromioclavicular and mild  glenohumeral osteoarthritis.   PATIENT SURVEYS:  FOTO 55%, predicted 65% in 12 visits   03/27/2022: 78% (Goal met)  COGNITION:           Overall cognitive status: Within functional limits for tasks assessed                                  SENSATION: Not tested   POSTURE: Forward shoulders, head   UPPER EXTREMITY ROM:    A/PROM Right eval Left eval Left 03/02/2022  Shoulder flexion 170 148/ 160p! 153  Shoulder abduction 170 160p!, 168p! 160  Shoulder internal rotation 95 90/95p!   Shoulder external rotation 90 75/80p!   (Blank rows = not tested)   UPPER EXTREMITY MMT:   MMT Right eval Left eval Right 03/27/2022 Left 03/27/2022  Shoulder flexion 4+/5 3+/5 4+/5 4+/5  Shoulder abduction 5/5 3+/5  4+/5  Shoulder internal rotation 5/5 5/5    Shoulder external rotation 4+/5 3+/5 4+/5 4/5  Middle trapezius 4/5 4/5 4/5 4/5  Lower trapezius 3+/5 Unable to assume testing positive due to pain 4/5 3+/5  Latissimus dorsi 4+/5 4+/5 5/5 5/5  Elbow flexion 5/5 5/5    Elbow extension 5/5 5/5    Grip strength (lbs)        (Blank rows = not tested)   SHOULDER SPECIAL TESTS:            Hawkins-Kennedy: (+) on Lt            Neer's: (+) on Lt            Painful arc: (-) on Lt (Painful from roughly 140-end range)            Cross-body adduction test: (-) on Lt            O'Brien's: (+) on Lt   JOINT MOBILITY TESTING:  Hypomobile and painful in all planes with Lt GHJ passive accessory mobility testing vs Rt   PALPATION:  TTP to posterior shoulder capsule/ teres insertions             TODAY'S TREATMENT:   [facility] Adult PT Treatment:                                                DATE: 03/27/2022 Therapeutic Exercise: Standing BIL shoulder scaption with scapular setting against wall with 2# dumbbells 3x10 Standing BIL shoulder flexion AAROM with ball at wall with PT perturbations at end range 3x30sec Seated low rows with 20# 2x10 Seated high rows with 20# 2x10 Seated lat pull-down  with 20# 2x10 Seated shoulder rolls 2x10 forward and backward Manual Therapy: N/A Neuromuscular re-ed: N/A Therapeutic Activity: Re-administration of FOTO with pt education Re-assessment of objective measures with pt education Modalities: N/A Self Care: N/A  [place] Adult PT Treatment:                                                DATE: 03/21/2022 Therapeutic Exercise: UBE - 2' fwd/2' bkwd for warm up while taking subjective Standing UE Ranger Lt shoulder flexion AAROM x20 '@25' '' Standing UE Ranger Lt shoulder abduction AAROM x20 '@25' '' Sidelying Lt shoulder ER with 3# dumbbell 3x12 Standing BIL shoulder scaption lifts with back to wall 3x10 Standing arm circles by side 2x10 forward and backward GTB row - 3x20 RTB shoulder ext - 3x20  [facility] Adult PT Treatment:                                                DATE: 03/09/2022 Therapeutic Exercise: Standing UE Ranger Lt shoulder flexion AAROM x10 with 10-sec hold at end range Standing UE Ranger Lt shoulder abduction AAROM x10 with 10-sec hold at end range Sidelying Lt shoulder ER with 3# dumbbell 3x10 Standing BIL shoulder scaption lifts with back to wall x10 with 2# dumbbells, x10 with 1# dumbbells, x10 unweighted Standing arm circles by side 2x10 forward and backward Seated low rows with 20# 2x10 Seated lat pull-down with 15# 2x10 Manual Therapy: N/A Neuromuscular re-ed: N/A Therapeutic Activity: N/A Modalities: N/A Self Care: N/A       PATIENT EDUCATION: Education details: Pt educated on POC, prognosis, potential underlying pathophysiology, and HEP Person educated: Patient Education method: Consulting civil engineer, Demonstration, and Handouts Education comprehension: verbalized understanding and returned demonstration     HOME EXERCISE PROGRAM: Access Code: [email or web address] URL: [email or web address] Date: 02/02/2022 Prepared by: Vanessa    Exercises - Shoulder Scaption AAROM with Dowel (Mirrored)  - 2 x  daily - 7 x weekly - 3 sets - 10 reps - 5 seconds hold - Standing Shoulder External Rotation AAROM with Dowel  - 2 x daily - 7 x weekly - 3 sets - 10 reps - 5 seconds hold - Standing Shoulder Internal Rotation Stretch with Towel  - 2 x daily - 7 x weekly - 3 sets - 10 reps - 5 seconds hold - Shoulder External Rotation and Scapular Retraction with Resistance  - 1 x daily - 7 x weekly - 3 sets - 10 reps - 5 seconds hold   ASSESSMENT:   CLINICAL IMPRESSION: Pt continues to progress well with pt, demonstrating improvements in resting pain levels, FOTO score, and global shoulder/ parascapular strength upon re-assessment today. She tolerated progressed exercises well and will continue to benefit from skilled PT to address her primary impairments and return to her prior level of function with less limitation.     OBJECTIVE IMPAIRMENTS decreased activity tolerance, decreased balance, decreased endurance, decreased mobility, decreased ROM, decreased strength, hypomobility, increased edema, impaired flexibility, impaired UE functional use, improper body mechanics, postural dysfunction, and pain.    ACTIVITY LIMITATIONS carrying, lifting, sleeping, bathing, dressing, hygiene/grooming, and caring for others   PARTICIPATION LIMITATIONS: meal prep, cleaning, laundry, driving, shopping, community activity, occupation, and yard work   PERSONAL FACTORS 3+ comorbidities: See medical hx  are also affecting patient's functional outcome.        GOALS: Goals reviewed with patient? Yes   SHORT TERM GOALS: Target date: 03/02/2022    Pt will report  understanding and adherence to initial HEP in order to promote independence in the management of primary impairments. Baseline: HEP provided at eval 03/02/2022: Pt reports daily adherence to her HEP Goal status: ACHIEVED     LONG TERM GOALS: Target date: 03/30/2022     Pt will achieve a FOTO score of 65% in order to demonstrate improved functional ability as it  relates to her primary impairments. Baseline: 55% 03/27/2022: 78% Goal status: ACHIEVED   2.  Pt will achieve Lt shoulder elevation AROM of 170 degrees in order to put away dishes into overhead cabinets with less limitation. Baseline: Flexion: 148d; Abduction: 160d 03/02/2022: Flexion: 153d; Abduction: 160d Goal status: IN PROGRESS   3.  Pt will report average pain at rest as 0-3/10 pain in order to sleep through the night with less limitation. Baseline: Resting pain average of 8-9/10 03/27/2022: 0-1/10 average pain Goal status: ACHIEVED   4.  Pt will achieve global Lt shoulder MMT of 5/5 in order to progress to lifting weight with her Lt UE for her job demands. Baseline: See MMT chart 03/27/2022: See updated MMT chart Goal status: IN PROGRESS       PLAN: PT FREQUENCY: 1x/week   PT DURATION: 8 weeks   PLANNED INTERVENTIONS: Therapeutic exercises, Therapeutic activity, Neuromuscular re-education, Patient/Family education, Self Care, Joint mobilization, Joint manipulation, Aquatic Therapy, Dry Needling, Electrical stimulation, Spinal manipulation, Spinal mobilization, Cryotherapy, Moist heat, Taping, Vasopneumatic device, Biofeedback, Ionotophoresis 61m/ml Dexamethasone, Manual therapy, and Re-evaluation   PLAN FOR NEXT SESSION: Progress shoulder/ parascapular strengthening, ROM, manual techniques for joint mobility     YVanessa [place] PT, DPT 03/27/22 12:59 PM

## 2022-04-03 ENCOUNTER — Ambulatory Visit: Payer: Medicare Other

## 2022-04-06 ENCOUNTER — Encounter (INDEPENDENT_AMBULATORY_CARE_PROVIDER_SITE_OTHER): Payer: Self-pay | Admitting: Primary Care

## 2022-04-06 ENCOUNTER — Ambulatory Visit (INDEPENDENT_AMBULATORY_CARE_PROVIDER_SITE_OTHER): Payer: Medicare Other | Admitting: Primary Care

## 2022-04-06 VITALS — BP 137/78 | HR 80 | Resp 16 | Wt 117.0 lb

## 2022-04-06 DIAGNOSIS — E785 Hyperlipidemia, unspecified: Secondary | ICD-10-CM | POA: Diagnosis not present

## 2022-04-06 DIAGNOSIS — I10 Essential (primary) hypertension: Secondary | ICD-10-CM | POA: Diagnosis not present

## 2022-04-06 DIAGNOSIS — E119 Type 2 diabetes mellitus without complications: Secondary | ICD-10-CM | POA: Diagnosis not present

## 2022-04-06 NOTE — Progress Notes (Signed)
[name] is a 66 y.o. female presents for hypertension evaluation, Denies shortness of breath, headaches, chest pain or lower extremity edema, sudden onset, vision changes, unilateral weakness, dizziness, paresthesias   Patient reports adherence with medications.  Dietary habits include: DASH diet Exercise habits include:walking Family / Social history: HTN, Diabetes    Past Medical History:  Diagnosis Date   Anemia    hx   Aneurysm ([place])    small, on left side of brain    Arthritis    "all over" (06/19/2017)   Diabetes mellitus without complication ([place])    diet control, pt denies   Hyperlipidemia    Hypertension    TIA (transient ischemic attack)    pt unaware of this hx on 06/19/2017   Past Surgical History:  Procedure Laterality Date   ANTERIOR CERVICAL DECOMP/DISCECTOMY FUSION     BACK SURGERY     CESAREAN SECTION  1986   FOOT SURGERY Left 11/2019   IR GENERIC HISTORICAL  09/21/2014   IR ANGIO INTRA EXTRACRAN SEL INTERNAL CAROTID UNI L MOD SED 09/21/2014 Marcia Lose, [facility]   IR GENERIC HISTORICAL  09/21/2014   IR ANGIO INTRA EXTRACRAN SEL COM CAROTID INNOMINATE UNI R MOD SED 09/21/2014 Marcia Lose, MD [facility]   IR GENERIC HISTORICAL  09/21/2014   IR ANGIO VERTEBRAL SEL VERTEBRAL UNI L MOD SED 09/21/2014 [facility]cia Lose, [facility]   IR GENERIC HISTORICAL  09/21/2014   IR 3D INDEPENDENT WKST 09/21/2014 Marcia Lose, [facility]   LAPAROSCOPIC CHOLECYSTECTOMY     TUBAL LIGATION     No Known Allergies Current Outpatient Medications on File Prior to Visit  Medication Sig Dispense Refill   amLODipine (NORVASC) 10 MG tablet Take 1 tablet (10 mg total) by mouth daily. 90 tablet 1   DULoxetine (CYMBALTA) 30 MG capsule Take 1 capsule (30 mg total) by mouth 2 (two) times daily. 120 capsule 1   gabapentin (NEURONTIN) 300 MG capsule 1 tab by mouth in the evening for 1 week, then 1 tab twice a day  for 1 week, then 1 tab three times a day     losartan-hydrochlorothiazide (HYZAAR) 100-25 MG tablet Take 1 tablet by mouth daily. 90 tablet 3   meclizine (ANTIVERT) 12.5 MG tablet Take 1 tablet (12.5 mg total) by mouth 3 (three) times daily as needed for dizziness. 60 tablet 1   meloxicam (MOBIC) 15 MG tablet Take 15 mg by mouth daily.     methocarbamol (ROBAXIN) 500 MG tablet Take 1 tablet (500 mg total) by mouth every 8 (eight) hours as needed for muscle spasms. 20 tablet 0   metoprolol succinate (TOPROL-XL) 25 MG 24 hr tablet TAKE 1 TABLET (25 MG TOTAL) BY MOUTH DAILY. 90 tablet 1   naproxen (NAPROSYN) 375 MG tablet Take 1 tablet (375 mg total) by mouth 2 (two) times daily with a meal. 10 tablet 0   pantoprazole (PROTONIX) 40 MG tablet Take 1 tablet (40 mg total) by mouth daily. 90 tablet 0   sitaGLIPtin (JANUVIA) 25 MG tablet Take 1 tablet (25 mg total) by mouth daily. 90 tablet 1   No current facility-administered medications on file prior to visit.   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   Social Determinants of Radio broadcast assistant Strain: [employer]  (12/15/2021)   Overall Financial Resource Strain (CARDIA)     Difficulty of Paying Living Expenses: Not hard at all  Food Insecurity: No Food Insecurity (12/15/2021)   Hunger Vital Sign    Worried About Running Out of Food in the Last Year: Never true    Ran Out of Food in the Last Year: Never true  Transportation Needs: No Transportation Needs (12/15/2021)   PRAPARE - Hydrologist (Medical): No    Lack of Transportation (Non-Medical): No  Physical Activity: Inactive (12/15/2021)   Exercise Vital Sign    Days of Exercise per Week: 0 days    Minutes of Exercise per Session: 10 min  Stress: No Stress Concern Present (12/15/2021)   [place]    Feeling of Stress : Only a little  Social Connections: Socially Isolated (12/15/2021)   Social Connection and Isolation Panel [NHANES]    Frequency of Communication with Friends and Family: More than three times a week    Frequency of Social Gatherings with Friends and Family: Twice a week    Attends Religious Services: Never    Marine scientist or Organizations: No    Attends Archivist Meetings: Never    Marital Status: Widowed  Intimate Partner Violence: Not At Risk (12/15/2021)   Humiliation, Afraid, Rape, and Kick questionnaire    Fear of Current or Ex-Partner: No    Emotionally Abused: No    Physically Abused: No    Sexually Abused: No   Family History  Problem Relation Age of Onset   Hypertension Mother    Arthritis Mother    Diabetes Mother    Dementia Mother    Heart attack Father    Heart disease Father    Hypertension Sister    Diabetes Sister    Hypertension Sister    Heart disease Sister    Diabetes Sister    Hypertension Brother    Heart disease Brother    Diabetes Brother    Hypertension Brother    Hypertension Brother    Hypertension Brother    Cancer Neg Hx    Colon cancer Neg Hx    Colon polyps Neg Hx    Esophageal cancer Neg Hx    Rectal cancer Neg Hx     Stomach cancer Neg Hx    Breast cancer Neg Hx      OBJECTIVE:  Vitals:   04/06/22 0859  BP: 137/78  Pulse: 80  Resp: 16  SpO2: 100%  Weight: 117 lb (53.1 kg)    Physical exam: General: Vital signs reviewed.  Patient is well-developed and well-nourished, Body mass index is 22.11 kg/m. in no  acute distress and cooperative with exam. Head: Normocephalic and atraumatic. Eyes: EOMI, conjunctivae normal, no scleral icterus. Neck: Supple, trachea midline, normal ROM, no JVD, masses, thyromegaly, or carotid bruit present. Cardiovascular: RRR, S1 normal, S2 normal, no murmurs, gallops, or rubs. Pulmonary/Chest: Clear to auscultation bilaterally, no wheezes, rales, or rhonchi. Abdominal: Soft, non-tender, non-distended, BS +, no masses, organomegaly, or guarding present. Musculoskeletal: No joint deformities, erythema, or stiffness, ROM full and nontender. Extremities: No lower extremity edema bilaterally,  pulses symmetric and intact bilaterally. No cyanosis or clubbing. Neurological: A&O x3, Strength is normal Skin: Warm, dry and intact. No rashes or erythema. Psychiatric: Normal mood and affect. speech and behavior is normal. Cognition and memory are normal.     ROS Comprehensive ROS Pertinent positive and negative noted in HPI   Last 3 Office BP readings: BP Readings from Last 3 Encounters:  04/06/22 137/78  02/16/22 (!) 162/101  01/22/22 (!) 156/91    BMET    Component Value Date/Time   NA 142 01/22/2022 1218   NA 141 10/18/2020 1619   K 4.0 01/22/2022 1218   CL 105 01/22/2022 1218   CO2 27 01/22/2022 1218   GLUCOSE 140 (H) 01/22/2022 1218   BUN 15 01/22/2022 1218   BUN 18 10/18/2020 1619   CREATININE 0.64 01/22/2022 1218   CREATININE 0.72 02/24/2013 1721   CALCIUM 9.4 01/22/2022 1218   GFRNONAA >60 01/22/2022 1218   GFRAA 109 05/27/2020 1018    Renal function: CrCl cannot be calculated (Patient's most recent lab result is older than the maximum 21 days  allowed.).  Clinical ASCVD: Yes  The 10-year ASCVD risk score (Arnett DK, et al., 2019) is: 25.2%   Values used to calculate the score:     Age: 74 years     Sex: Female     Is Non-Hispanic African American: Yes     Diabetic: Yes     Tobacco smoker: No     Systolic Blood Pressure: 427 mmHg     Is BP treated: Yes     HDL Cholesterol: 73 mg/dL     Total Cholesterol: 199 mg/dL  ASCVD risk factors include- [place]   ASSESSMENT & PLAN: Cariah was seen today for blood pressure check.  Diagnoses and all orders for this visit:  Diabetes mellitus type 2 in nonobese ([place]) ADA recommends the following therapeutic goals for glycemic control related to A1c measurements: Goal of therapy: Less than 8 hemoglobin A1c.  Reference clinical practice recommendations. Foods that are high in carbohydrates are the following rice, potatoes, breads, sugars, and pastas.  Reduction in the intake (eating) will assist in lowering your blood sugars.  -     Microalbumin / creatinine urine ratio -     Ambulatory referral to Ophthalmology    Hyperlipidemia, unspecified hyperlipidemia type  Healthy lifestyle diet of fruits vegetables fish nuts whole grains and low saturated fat . Foods high in cholesterol or liver, fatty meats,cheese, butter avocados, nuts and seeds, chocolate and fried foods. -     Lipid panel    Essential hypertension -Counseled on lifestyle modifications for blood pressure control including reduced dietary sodium, increased exercise, weight reduction and adequate sleep. Also, educated patient about the risk for cardiovascular events, stroke and heart attack. Also counseled patient about the importance of medication adherence. If you participate in smoking, it is important to stop using tobacco as this will increase the risks associated with uncontrolled blood pressure.   Goal BP:  For patients younger than 60: Goal  BP < 130/80. For patients 60 and older: Goal BP < 140/90. For patients with  diabetes: Goal BP < 130/80. Your most recent BP: 137/78  Minimize salt intake. Minimize alcohol intake    This note has been created with Surveyor, quantity. Any transcriptional errors are unintentional.   Marcia Perna, NP 04/06/2022, 9:26 AM

## 2022-04-07 LAB — CBC
Hematocrit: 39.7 % (ref 34.0–46.6)
Hemoglobin: 13.1 g/dL (ref 11.1–15.9)
MCH: 27.6 pg (ref 26.6–33.0)
MCHC: 33 g/dL (ref 31.5–35.7)
MCV: 84 fL (ref 79–97)
Platelets: 380 10*3/uL (ref 150–450)
RBC: 4.74 x10E6/uL (ref 3.77–5.28)
RDW: 12.7 % (ref 11.7–15.4)
WBC: 10.6 10*3/uL (ref 3.4–10.8)

## 2022-04-07 LAB — CMP14+EGFR
ALT: 10 IU/L (ref 0–32)
AST: 13 IU/L (ref 0–40)
Albumin/Globulin Ratio: 2.1 (ref 1.2–2.2)
Albumin: 4.9 g/dL (ref 3.9–4.9)
Alkaline Phosphatase: 111 IU/L (ref 44–121)
BUN/Creatinine Ratio: 22 (ref 12–28)
BUN: 16 mg/dL (ref 8–27)
Bilirubin Total: <0.2 mg/dL (ref 0.0–1.2)
CO2: 25 mmol/L (ref 20–29)
Calcium: 10.2 mg/dL (ref 8.7–10.3)
Chloride: 99 mmol/L (ref 96–106)
Creatinine, Ser: 0.74 mg/dL (ref 0.57–1.00)
Globulin, Total: 2.3 g/dL (ref 1.5–4.5)
Glucose: 120 mg/dL — ABNORMAL HIGH (ref 70–99)
Potassium: 4.7 mmol/L (ref 3.5–5.2)
Sodium: 140 mmol/L (ref 134–144)
Total Protein: 7.2 g/dL (ref 6.0–8.5)
eGFR: 89 mL/min/{1.73_m2} (ref >59)

## 2022-04-07 LAB — LIPID PANEL
Chol/HDL Ratio: 4.8 ratio — ABNORMAL HIGH (ref 0.0–4.4)
Cholesterol, Total: 295 mg/dL — ABNORMAL HIGH (ref 100–199)
HDL: 61 mg/dL (ref >39)
LDL Chol Calc (NIH): 189 mg/dL — ABNORMAL HIGH (ref 0–99)
Triglycerides: 234 mg/dL — ABNORMAL HIGH (ref 0–149)
VLDL Cholesterol Cal: 45 mg/dL — ABNORMAL HIGH (ref 5–40)

## 2022-04-07 LAB — MICROALBUMIN / CREATININE URINE RATIO
Creatinine, Urine: 139.3 mg/dL
Microalb/Creat Ratio: 5 mg/g creat (ref 0–29)
Microalbumin, Urine: 7.6 ug/mL

## 2022-04-09 ENCOUNTER — Other Ambulatory Visit (INDEPENDENT_AMBULATORY_CARE_PROVIDER_SITE_OTHER): Payer: Self-pay | Admitting: Primary Care

## 2022-04-09 DIAGNOSIS — E782 Mixed hyperlipidemia: Secondary | ICD-10-CM

## 2022-04-09 MED ORDER — ROSUVASTATIN CALCIUM 40 MG PO TABS: 40.0000 mg | ORAL_TABLET | Freq: Every day | ORAL | 3 refills | Status: DC

## 2022-04-12 ENCOUNTER — Ambulatory Visit: Payer: Medicare Other

## 2022-04-12 DIAGNOSIS — G8929 Other chronic pain: Secondary | ICD-10-CM

## 2022-04-12 DIAGNOSIS — M25512 Pain in left shoulder: Secondary | ICD-10-CM | POA: Diagnosis not present

## 2022-04-12 DIAGNOSIS — R600 Localized edema: Secondary | ICD-10-CM

## 2022-04-12 DIAGNOSIS — M6281 Muscle weakness (generalized): Secondary | ICD-10-CM

## 2022-04-12 NOTE — Therapy (Signed)
OUTPATIENT PHYSICAL THERAPY TREATMENT NOTE/ DISCHARGE SUMMARY   Patient Name: [name] MRN: [id number] DOB:August 24, 1955, 66 y.o., female Today's Date: 04/12/2022  PCP: [name], NP REFERRING PROVIDER: Marchia Bond, MD  END OF SESSION:   PT End of Session - 04/12/22 1536     Visit Number 7    Date for PT Re-Evaluation 04/12/22    Authorization Type UHC MCR    Authorization Time Period FOTO v6, v10, KX mod v15    Progress Note Due on Visit 10    PT Start Time 1528    PT Stop Time 1606    PT Time Calculation (min) 38 min    Activity Tolerance Patient tolerated treatment well    Behavior During Therapy WFL for tasks assessed/performed                 Past Medical History:  Diagnosis Date   Anemia    hx   Aneurysm ([place])    small, on left side of brain    Arthritis    "all over" (06/19/2017)   Diabetes mellitus without complication ([facility])    diet control, pt denies   Hyperlipidemia    Hypertension    TIA (transient ischemic attack)    pt unaware of this hx on 06/19/2017   Past Surgical History:  Procedure Laterality Date   ANTERIOR CERVICAL DECOMP/DISCECTOMY [place] Left 11/2019   IR GENERIC HISTORICAL  09/21/2014   IR ANGIO INTRA EXTRACRAN SEL INTERNAL CAROTID UNI L MOD SED 09/21/2014 [name], MD [facility]   IR GENERIC HISTORICAL  09/21/2014   IR ANGIO INTRA EXTRACRAN SEL COM CAROTID INNOMINATE UNI R MOD SED 09/21/2014 [name], MD [facility]   IR GENERIC HISTORICAL  09/21/2014   IR ANGIO VERTEBRAL SEL VERTEBRAL UNI L MOD SED 09/21/2014 [name], MD [facility]   IR GENERIC HISTORICAL  09/21/2014   IR 3D INDEPENDENT WKST 09/21/2014 [name], MD [facility]   LAPAROSCOPIC CHOLECYSTECTOMY     TUBAL LIGATION     Patient Active Problem List   Diagnosis Date Noted   Numbness and tingling of left hand 02/03/2021   Carpal tunnel syndrome of  left wrist 02/03/2021   Sepsis due to undetermined organism ([place]) 10/10/2020   Enteritis of infectious origin 10/09/2020   Diabetes mellitus type 2 in nonobese ([place]) 10/09/2020   Bilateral impacted cerumen 04/02/2019   Conductive hearing loss, bilateral 04/02/2019   Nausea and vomiting 05/17/2018   Enteritis 06/19/2017   Abdominal pain 06/18/2017   Hyperlipidemia 06/18/2017   Elevated glucose 06/18/2017   Need for hepatitis C screening test 05/23/2017   Breast pain, left 01/05/2016   Left ankle sprain 01/20/2015   Gastroenteritis 11/08/2014   Malaise 10/14/2014   Hypokalemia 10/14/2014   Essential hypertension    Bilateral wrist pain 10/06/2014   Hot flashes, menopausal 10/06/2014   Cerebral aneurysm without rupture 08/20/2014   TIA (transient ischemic attack) 08/19/2014   Accelerated hypertension 02/24/2013   Depression 02/24/2013    REFERRING DIAG: M25.519 (ICD-10-CM) - Shoulder pain  THERAPY DIAG:  Chronic left shoulder pain  Muscle weakness (generalized)  Localized edema  Rationale for Evaluation and Treatment Rehabilitation  PERTINENT HISTORY: HTN, DMII, arthritis, brain aneurysm, hx of TIA, Anterior Cervical decompression/ discectomy/ fusion in the late 1990's   PRECAUTIONS: None  ONSET DATE: Four months ago  SUBJECTIVE: Pt reports she has seen  continued improvement in her shoulder pain, reporting no pain currently or when reaching overhead. She reports readiness for discharge today.  PAIN:  Are you having pain? Yes: NPRS scale: 0/10 Pain location: Lt shoulder Pain description: Sharp, dull Aggravating factors: reaching certain directions, overhead movements, lifting any amount of weight, and turning in her sleep Relieving factors: pain cream, heating pad   OBJECTIVE: (objective measures completed at initial evaluation unless otherwise dated)   DIAGNOSTIC FINDINGS:  01/22/2022: DG Shoulder Lt: IMPRESSION: Mild-to-moderate acromioclavicular and mild  glenohumeral osteoarthritis.   PATIENT SURVEYS:  FOTO 55%, predicted 65% in 12 visits   03/27/2022: 78% (Goal met)  COGNITION:           Overall cognitive status: Within functional limits for tasks assessed                                  SENSATION: Not tested   POSTURE: Forward shoulders, head   UPPER EXTREMITY ROM:    A/PROM Right eval Left eval Left 03/02/2022 Left 04/12/2022  Shoulder flexion 170 148/ 160p! 153 176  Shoulder abduction 170 160p!, 168p! 160 185  Shoulder internal rotation 95 90/95p!  90  Shoulder external rotation 90 75/80p!  90  (Blank rows = not tested)   UPPER EXTREMITY MMT:   MMT Right eval Left eval Right 03/27/2022 Left 03/27/2022 Right 04/12/2022 Left 04/12/2022  Shoulder flexion 4+/5 3+/5 4+/5 4+/5 5/5 5/5  Shoulder abduction 5/5 3+/5  4+/5 5/5 5/5  Shoulder internal rotation 5/5 5/5   5/5 5/5  Shoulder external rotation 4+/5 3+/5 4+/5 4/5 5/5 5/5  Middle trapezius 4/5 4/5 4/5 4/5    Lower trapezius 3+/5 Unable to assume testing positive due to pain 4/5 3+/5    Latissimus dorsi 4+/5 4+/5 5/5 5/5    Elbow flexion 5/5 5/5      Elbow extension 5/5 5/5      Grip strength (lbs)          (Blank rows = not tested)   SHOULDER SPECIAL TESTS:            Hawkins-Kennedy: (+) on Lt            Neer's: (+) on Lt            Painful arc: (-) on Lt (Painful from roughly 140-end range)            Cross-body adduction test: (-) on Lt            O'Brien's: (+) on Lt   JOINT MOBILITY TESTING:  Hypomobile and painful in all planes with Lt GHJ passive accessory mobility testing vs Rt   PALPATION:  TTP to posterior shoulder capsule/ teres insertions             TODAY'S TREATMENT:   [facility] Adult PT Treatment:                                                DATE: 04/12/2022 Therapeutic Exercise: Standing rows with blue band 3x10 with 3-sec hold Standing BIL shoulder extension with red band 3x10 with 3-sec hold Standing BIL shoulder scaption with 2#  dumbbells 3x10 Standing shoulder rolls 2x10 forward and backward Manual Therapy: N/A Neuromuscular re-ed: N/A Therapeutic Activity: Re-assessment of objective measures with pt education Update to HEP  with pt education Modalities: N/A Self Care: N/A   [facility] Adult PT Treatment:                                                DATE: 03/27/2022 Therapeutic Exercise: Standing BIL shoulder scaption with scapular setting against wall with 2# dumbbells 3x10 Standing BIL shoulder flexion AAROM with ball at wall with PT perturbations at end range 3x30sec Seated low rows with 20# 2x10 Seated high rows with 20# 2x10 Seated lat pull-down with 20# 2x10 Seated shoulder rolls 2x10 forward and backward Manual Therapy: N/A Neuromuscular re-ed: N/A Therapeutic Activity: Re-administration of FOTO with pt education Re-assessment of objective measures with pt education Modalities: N/A Self Care: N/A   [facility] Adult PT Treatment:                                                DATE: 03/21/2022 Therapeutic Exercise: UBE - 2' fwd/2' bkwd for warm up while taking subjective Standing UE Ranger Lt shoulder flexion AAROM x20 '@25' '' Standing UE Ranger Lt shoulder abduction AAROM x20 '@25' '' Sidelying Lt shoulder ER with 3# dumbbell 3x12 Standing BIL shoulder scaption lifts with back to wall 3x10 Standing arm circles by side 2x10 forward and backward GTB row - 3x20 RTB shoulder ext - 3x20      PATIENT EDUCATION: Education details: Pt educated on POC, prognosis, potential underlying pathophysiology, and HEP Person educated: Patient Education method: Consulting civil engineer, Demonstration, and Handouts Education comprehension: verbalized understanding and returned demonstration     HOME EXERCISE PROGRAM: Access Code: [email or web address] URL: [email or web address] Date: 02/02/2022 Prepared by: Vanessa Dale   Exercises - Shoulder Scaption AAROM with Dowel (Mirrored)  - 2 x daily - 7 x weekly - 3 sets -  10 reps - 5 seconds hold - Standing Shoulder External Rotation AAROM with Dowel  - 2 x daily - 7 x weekly - 3 sets - 10 reps - 5 seconds hold - Standing Shoulder Internal Rotation Stretch with Towel  - 2 x daily - 7 x weekly - 3 sets - 10 reps - 5 seconds hold - Shoulder External Rotation and Scapular Retraction with Resistance  - 1 x daily - 7 x weekly - 3 sets - 10 reps - 5 seconds hold  Added 04/12/2022: - Standing Shoulder Row with Anchored Resistance  - 1 x daily - 7 x weekly - 3 sets - 10 reps - 3 seconds hold - Scaption with Dumbbells  - 1 x daily - 7 x weekly - 3 sets - 10 reps - Shoulder extension with resistance - Neutral  - 1 x daily - 7 x weekly - 3 sets - 10 reps - 3 seconds hold   ASSESSMENT:   CLINICAL IMPRESSION: Upon re-assessment of objective measures, the pt has met all of her rehab goals, making excellent progress in global shoulder strength, ROM, functional ability, and pain levels. She tolerated exercises well today, and these were added to her HEP. Due to meeting her goals and feeling independent in the future management of her pain, the pt is discharged from PT at this time.     OBJECTIVE IMPAIRMENTS decreased activity tolerance, decreased balance, decreased endurance, decreased mobility, decreased ROM, decreased strength, hypomobility, increased edema, impaired flexibility,  impaired UE functional use, improper body mechanics, postural dysfunction, and pain.    ACTIVITY LIMITATIONS carrying, lifting, sleeping, bathing, dressing, hygiene/grooming, and caring for others   PARTICIPATION LIMITATIONS: meal prep, cleaning, laundry, driving, shopping, community activity, occupation, and yard work   PERSONAL FACTORS 3+ comorbidities: See medical hx  are also affecting patient's functional outcome.        GOALS: Goals reviewed with patient? Yes   SHORT TERM GOALS: Target date: 03/02/2022    Pt will report understanding and adherence to initial HEP in order to promote  independence in the management of primary impairments. Baseline: HEP provided at eval 03/02/2022: Pt reports daily adherence to her HEP Goal status: ACHIEVED     LONG TERM GOALS: Target date: 03/30/2022     Pt will achieve a FOTO score of 65% in order to demonstrate improved functional ability as it relates to her primary impairments. Baseline: 55% 03/27/2022: 78% Goal status: ACHIEVED   2.  Pt will achieve Lt shoulder elevation AROM of 170 degrees in order to put away dishes into overhead cabinets with less limitation. Baseline: Flexion: 148d; Abduction: 160d 03/02/2022: Flexion: 153d; Abduction: 160d 04/12/2022: Flexion: 176;Abduction: 185 Goal status: ACHIEVED   3.  Pt will report average pain at rest as 0-3/10 pain in order to sleep through the night with less limitation. Baseline: Resting pain average of 8-9/10 03/27/2022: 0-1/10 average pain Goal status: ACHIEVED   4.  Pt will achieve global Lt shoulder MMT of 5/5 in order to progress to lifting weight with her Lt UE for her job demands. Baseline: See MMT chart 03/27/2022: See updated MMT chart 04/12/2022: See updated MMT chart Goal status: ACHIEVED       PLAN: PT FREQUENCY: 1x/week   PT DURATION: 8 weeks   PLANNED INTERVENTIONS: Therapeutic exercises, Therapeutic activity, Neuromuscular re-education, Patient/Family education, Self Care, Joint mobilization, Joint manipulation, Aquatic Therapy, Dry Needling, Electrical stimulation, Spinal manipulation, Spinal mobilization, Cryotherapy, Moist heat, Taping, Vasopneumatic device, Biofeedback, Ionotophoresis 73m/ml Dexamethasone, Manual therapy, and Re-evaluation   PLAN FOR NEXT SESSION: Pt is discharged from PT at this time.   PHYSICAL THERAPY DISCHARGE SUMMARY  Visits from Start of Care: 7  Current functional level related to goals / functional outcomes: Pt has met all of her rehab goals.   Remaining deficits: N/A   Education / Equipment: HEP   Patient agrees to  discharge. Patient goals were met. Patient is being discharged due to meeting the stated rehab goals.   YVanessa [place] PT, DPT 04/12/22 4:07 PM

## 2022-05-11 ENCOUNTER — Other Ambulatory Visit: Payer: Self-pay

## 2022-05-11 MED ORDER — PANTOPRAZOLE SODIUM 40 MG PO TBEC: 40.0000 mg | DELAYED_RELEASE_TABLET | Freq: Every day | ORAL | 0 refills | Status: DC

## 2022-05-24 ENCOUNTER — Other Ambulatory Visit: Payer: Self-pay | Admitting: Gastroenterology

## 2022-05-25 ENCOUNTER — Other Ambulatory Visit (HOSPITAL_COMMUNITY): Payer: Self-pay

## 2022-05-28 ENCOUNTER — Other Ambulatory Visit (HOSPITAL_COMMUNITY): Payer: Self-pay

## 2022-05-30 ENCOUNTER — Ambulatory Visit: Payer: Medicare Other | Admitting: Podiatry

## 2022-05-31 ENCOUNTER — Ambulatory Visit: Payer: Medicare Other | Admitting: Podiatry

## 2022-06-01 ENCOUNTER — Encounter (HOSPITAL_COMMUNITY): Payer: Self-pay | Admitting: *Deleted

## 2022-06-01 ENCOUNTER — Ambulatory Visit (HOSPITAL_COMMUNITY)
Admission: EM | Admit: 2022-06-01 | Discharge: 2022-06-01 | Disposition: A | Discharge status: Home / Self Care | Payer: Medicare Other | Attending: Nurse Practitioner | Admitting: Nurse Practitioner

## 2022-06-01 ENCOUNTER — Ambulatory Visit (INDEPENDENT_AMBULATORY_CARE_PROVIDER_SITE_OTHER): Payer: Medicare Other

## 2022-06-01 DIAGNOSIS — S96911A Strain of unspecified muscle and tendon at ankle and foot level, right foot, initial encounter: Secondary | ICD-10-CM

## 2022-06-01 DIAGNOSIS — M79671 Pain in right foot: Secondary | ICD-10-CM | POA: Diagnosis not present

## 2022-06-01 DIAGNOSIS — G629 Polyneuropathy, unspecified: Secondary | ICD-10-CM | POA: Diagnosis not present

## 2022-06-01 MED ORDER — NAPROXEN 375 MG PO TABS: 375.0000 mg | ORAL_TABLET | Freq: Two times a day (BID) | ORAL | 0 refills | Status: AC

## 2022-06-01 NOTE — ED Provider Notes (Signed)
[place]    CSN: [id number] Arrival date & time: 06/01/22  0915    History   Chief Complaint Chief Complaint  Patient presents with   Foot Burn    HPI [name] is a 66 y.o. female presents for 1 day of right lateral foot pain.  Denies any known injury to the area she states she "woke up with it".  Reports a constant aching pain and feels as though it is swollen.  No inciting event.  In addition she endorses a burning sensation in the distal part of both feet as well as numbness.  Patient does have a history of diabetes and neuropathy.  She has been on gabapentin in the past but states she did not tolerate.  She has an appointment with podiatry on Monday and is also following with her PCP.  She has not taken any OTC medications for her current symptoms.  No other concerns at this time.  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   Sepsis due to undetermined organism ([place]) 10/10/2020   Enteritis of infectious origin 10/09/2020   Diabetes mellitus type 2 in nonobese ([place]) 10/09/2020   Bilateral impacted cerumen 04/02/2019   Conductive hearing loss, bilateral 04/02/2019   Nausea and vomiting 05/17/2018   Enteritis 06/19/2017   Abdominal pain 06/18/2017   Hyperlipidemia 06/18/2017   Elevated glucose 06/18/2017   Need for hepatitis C screening test 05/23/2017   Breast pain, left 01/05/2016   Left ankle sprain 01/20/2015   Gastroenteritis 11/08/2014   Malaise 10/14/2014   Hypokalemia 10/14/2014   Essential  hypertension    Bilateral wrist pain 10/06/2014   Hot flashes, menopausal 10/06/2014   Cerebral aneurysm without rupture 08/20/2014   TIA (transient ischemic attack) 08/19/2014   Accelerated hypertension 02/24/2013   Depression 02/24/2013    Past Surgical History:  Procedure Laterality Date   ANTERIOR CERVICAL DECOMP/DISCECTOMY FUSION     BACK SURGERY     CESAREAN SECTION  1986   FOOT SURGERY Left 11/2019   IR GENERIC HISTORICAL  09/21/2014   IR ANGIO INTRA EXTRACRAN SEL INTERNAL CAROTID UNI L MOD SED 09/21/2014 [name], MD [facility]   IR GENERIC HISTORICAL  09/21/2014   IR ANGIO INTRA EXTRACRAN SEL COM CAROTID INNOMINATE UNI R MOD SED 09/21/2014 [name], MD [facility]   IR GENERIC HISTORICAL  09/21/2014   IR ANGIO VERTEBRAL SEL VERTEBRAL UNI L MOD SED 09/21/2014 [name], MD [facility]   IR GENERIC HISTORICAL  09/21/2014   IR 3D INDEPENDENT WKST 09/21/2014 [name], MD [facility]   LAPAROSCOPIC CHOLECYSTECTOMY     TUBAL LIGATION      OB History     Gravida  2   Para  1   Term  1   Preterm      AB  1   Living  1      SAB  1  IAB      Ectopic      Multiple      Live Births  1            Home Medications    Prior to Admission medications   Medication Sig Start Date End Date Taking? Authorizing Provider  amLODipine (NORVASC) 10 MG tablet Take 1 tablet (10 mg total) by mouth daily. 05/15/21  Yes [name], NP  DULoxetine (CYMBALTA) 30 MG capsule Take 1 capsule (30 mg total) by mouth 2 (two) times daily. 02/16/22  Yes [name], NP  losartan-hydrochlorothiazide (HYZAAR) 100-25 MG tablet Take 1 tablet by mouth daily. 05/15/21  Yes [name], NP  meclizine (ANTIVERT) 12.5 MG tablet Take 1 tablet (12.5 mg total) by mouth 3 (three) times daily as needed for dizziness. 11/14/21  Yes [name], NP  meloxicam (MOBIC) 15 MG tablet Take 15 mg by mouth daily. 07/12/21  Yes [provider]  methocarbamol (ROBAXIN) 500 MG tablet Take 1 tablet (500 mg total) by mouth every 8 (eight) hours as needed for muscle spasms. 01/22/22  Yes Rancour, Annie Main, MD  metoprolol succinate (TOPROL-XL) 25 MG 24 hr tablet TAKE 1 TABLET (25 MG TOTAL) BY MOUTH DAILY. 01/05/22  Yes Cantwell, Celeste C, PA-C  naproxen (NAPROSYN) 375 MG tablet Take 1 tablet (375 mg total) by mouth 2 (two) times daily with a meal. 01/22/22  Yes Rancour, Annie Main, MD  pantoprazole (PROTONIX) 40 MG tablet Take 1 tablet (40 mg total) by mouth daily. 05/11/22  Yes Ladene Artist, MD  sitaGLIPtin (JANUVIA) 25 MG tablet Take 1 tablet (25 mg total) by mouth daily. 11/14/21  Yes [name], NP  rosuvastatin (CRESTOR) 40 MG tablet Take 1 tablet (40 mg total) by mouth daily. 04/09/22   [name], NP    Family History Family History  Problem Relation Age of Onset   Hypertension Mother    Arthritis Mother    Diabetes Mother    Dementia Mother    Heart attack Father    Heart disease Father    Hypertension Sister    Diabetes Sister    Hypertension Sister    Heart disease Sister    Diabetes Sister    Hypertension Brother    Heart disease Brother    Diabetes Brother    Hypertension Brother    Hypertension Brother    Hypertension Brother    Cancer Neg Hx    Colon cancer Neg Hx    Colon polyps Neg Hx    Esophageal cancer Neg Hx    Rectal cancer Neg Hx    Stomach cancer Neg Hx    Breast cancer Neg Hx     Social History Social History   Tobacco Use   Smoking status: Never   Smokeless tobacco: Never  Vaping Use   Vaping Use: Never used  Substance Use Topics   Alcohol use: Yes    Alcohol/week: 3.0 standard drinks of alcohol    Types: 3 Standard drinks or equivalent per week    Comment: occ   Drug use: Not Currently     Allergies   Patient has no known allergies.   Review of Systems Review of Systems  Musculoskeletal:        Right foot pain, bilateral foot numbness/burning  sensation      Physical Exam Triage Vital Signs ED Triage Vitals  Enc Vitals Group     BP 06/01/22 0937 (!) 180/109  Pulse Rate 06/01/22 0937 87     Resp 06/01/22 0937 18     Temp 06/01/22 0937 98.3 F (36.8 C)     Temp Source 06/01/22 0937 Oral     SpO2 06/01/22 0937 98 %     Weight --      Height --      Head Circumference --      Peak Flow --      Pain Score 06/01/22 0934 9     Pain Loc --      Pain Edu? --      Excl. in [place]? --    No data found.  Updated Vital Signs BP (!) 180/109 (BP Location: Right Arm) Comment: pt hasnt taken her meds this morning  Pulse 87   Temp 98.3 F (36.8 C) (Oral)   Resp 18   SpO2 98%   Visual Acuity Right Eye Distance:   Left Eye Distance:   Bilateral Distance:    Right Eye Near:   Left Eye Near:    Bilateral Near:     Physical Exam Vitals and nursing note reviewed.  Constitutional:      General: She is not in acute distress.    Appearance: Normal appearance. She is not ill-appearing.  HENT:     Head: Normocephalic and atraumatic.  Eyes:     Pupils: Pupils are equal, round, and reactive to light.  Cardiovascular:     Rate and Rhythm: Normal rate.  Pulmonary:     Effort: Pulmonary effort is normal.  Musculoskeletal:       Feet:  Skin:    General: Skin is warm and dry.  Neurological:     General: No focal deficit present.     Mental Status: She is alert and oriented to person, place, and time.  Psychiatric:        Mood and Affect: Mood normal.        Behavior: Behavior normal.      UC Treatments / Results  Labs (all labs ordered are listed, but only abnormal results are displayed) Labs Reviewed - No data to display  EKG   Radiology DG Foot Complete Right  Result Date: 06/01/2022 CLINICAL DATA:  Foot burning.  Lateral foot pain. EXAM: RIGHT FOOT COMPLETE - 3+ VIEW COMPARISON:  08/29/2021 FINDINGS: No evidence for an acute fracture. No subluxation or dislocation. Hallux valgus deformity noted with  degenerative changes at the great toe MTP joint. Posttraumatic deformity noted proximal phalanx of the great toe. IMPRESSION: 1. No acute bony findings. 2. Hallux valgus deformity with degenerative changes at the great toe MTP joint. Electronically Signed   By: Misty Stanley M.D.   On: 06/01/2022 10:13    Procedures Procedures (including critical care time)  Medications Ordered in UC Medications - No data to display  Initial Impression / Assessment and Plan / UC Course  I have reviewed the triage vital signs and the nursing notes.  Pertinent labs & imaging results that were available during my care of the patient were reviewed by me and considered in my medical decision making (see chart for details).     Reviewed exam and symptoms with patient.  X-ray negative for fracture Discussed strain of right foot RICE therapy Discussed neuropathy and need for PCP follow-up for further treatment options Trial of naproxen Follow-up with podiatrist as scheduled appointment on Monday ER precautions reviewed and patient verbalized understanding Final Clinical Impressions(s) / UC Diagnoses   Final diagnoses:  Right foot pain  Discharge Instructions   None    ED Prescriptions   None    PDMP not reviewed this encounter.   Melynda Ripple, NP 06/01/22 1025

## 2022-06-01 NOTE — Discharge Instructions (Signed)
Naproxen twice daily for 7 days Rest, elevate, ice to the right foot as needed Follow-up with your podiatrist at your scheduled appointment on Monday Please go to the ER for any worsening symptoms I hope you feel better soon!

## 2022-06-01 NOTE — ED Triage Notes (Signed)
Pt states she has bilateral foot burning worse on the right side. The pt states she has had it for awhile she tried to get into foot doctor but they can't see her until Monday. She has been on gabapentin before but she couldn't take it due to nausea and diarrhea.

## 2022-06-04 ENCOUNTER — Encounter (INDEPENDENT_AMBULATORY_CARE_PROVIDER_SITE_OTHER): Payer: Self-pay | Admitting: Primary Care

## 2022-06-04 ENCOUNTER — Ambulatory Visit (INDEPENDENT_AMBULATORY_CARE_PROVIDER_SITE_OTHER): Payer: Medicare Other | Admitting: Primary Care

## 2022-06-04 ENCOUNTER — Ambulatory Visit: Payer: Medicare Other | Admitting: Podiatry

## 2022-06-04 ENCOUNTER — Encounter: Payer: Self-pay | Admitting: Podiatry

## 2022-06-04 ENCOUNTER — Ambulatory Visit (INDEPENDENT_AMBULATORY_CARE_PROVIDER_SITE_OTHER): Payer: Medicare Other

## 2022-06-04 VITALS — BP 166/118 | HR 90 | Resp 16 | Wt 114.2 lb

## 2022-06-04 DIAGNOSIS — G629 Polyneuropathy, unspecified: Secondary | ICD-10-CM

## 2022-06-04 DIAGNOSIS — M7751 Other enthesopathy of right foot: Secondary | ICD-10-CM

## 2022-06-04 DIAGNOSIS — M7752 Other enthesopathy of left foot: Secondary | ICD-10-CM

## 2022-06-04 DIAGNOSIS — I10 Essential (primary) hypertension: Secondary | ICD-10-CM

## 2022-06-04 DIAGNOSIS — E119 Type 2 diabetes mellitus without complications: Secondary | ICD-10-CM | POA: Diagnosis not present

## 2022-06-04 LAB — POCT GLYCOSYLATED HEMOGLOBIN (HGB A1C): HbA1c, POC (controlled diabetic range): 7.1 % — AB (ref 0.0–7.0)

## 2022-06-04 MED ORDER — METOPROLOL SUCCINATE ER 25 MG PO TB24: 25.0000 mg | ORAL_TABLET | Freq: Every day | ORAL | 1 refills | Status: DC

## 2022-06-04 MED ORDER — TRIAMCINOLONE ACETONIDE 10 MG/ML IJ SUSP
20.0000 mg | Freq: Once | INTRAMUSCULAR | Status: AC
  Administered 2022-06-04: 20 mg

## 2022-06-04 NOTE — Progress Notes (Signed)
[name], is a 66 y.o. female  IHK:[id number]  VFI:[id number]  DOB - [birth year]  Chief Complaint  Patient presents with   Diabetes   Hypertension       Subjective:   [name] is a 66 y.o. female here today for Bp follow up visit. Patient has No headache, No chest pain, No abdominal pain - No Nausea, No new weakness tingling or numbness, No Cough - shortness of breath  No problems updated.  No Known Allergies  Past Medical History:  Diagnosis Date   Anemia    hx   Aneurysm ([place])    small, on left side of brain    Arthritis    "all over" (06/19/2017)   Diabetes mellitus without complication (HCC)    diet control, pt denies   Hyperlipidemia    Hypertension    TIA (transient ischemic attack)    pt unaware of this hx on 06/19/2017    Current Outpatient Medications on File Prior to Visit  Medication Sig Dispense Refill   amLODipine (NORVASC) 10 MG tablet Take 1 tablet (10 mg total) by mouth daily. 90 tablet 1   DULoxetine (CYMBALTA) 30 MG capsule Take 1 capsule (30 mg total) by mouth 2 (two) times daily. 120 capsule 1   losartan-hydrochlorothiazide (HYZAAR) 100-25 MG tablet Take 1 tablet by mouth daily. 90 tablet 3   meclizine (ANTIVERT) 12.5 MG tablet Take 1 tablet (12.5 mg total) by mouth 3 (three) times daily as needed for dizziness. 60 tablet 1   methocarbamol (ROBAXIN) 500 MG tablet Take 1 tablet (500 mg total) by mouth every 8 (eight) hours as needed for muscle spasms. 20 tablet 0   naproxen (NAPROSYN) 375 MG tablet Take 1 tablet (375 mg total) by mouth 2 (two) times daily for 7 days. 14 tablet 0   pantoprazole (PROTONIX) 40 MG tablet Take 1 tablet (40 mg total) by mouth daily. 30 tablet 0   rosuvastatin (CRESTOR) 40 MG tablet Take 1 tablet (40 mg total) by mouth daily. 90 tablet 3   sitaGLIPtin (JANUVIA) 25 MG tablet Take 1 tablet (25 mg total) by mouth daily. 90 tablet 1   No current facility-administered medications  on file prior to visit.    Objective:   Vitals:   06/04/22 1104  BP: (Abnormal) 166/118  Pulse: 90  Resp: 16  SpO2: 98%  Weight: 114 lb 3.2 oz (51.8 kg)    Exam General appearance : Awake, alert, not in any distress. Speech Clear. Not toxic looking HEENT: Atraumatic and Normocephalic, pupils equally reactive to light and accomodation Neck: Supple, no JVD. No cervical lymphadenopathy.  Chest: Good air entry bilaterally, no added sounds  CVS: S1 S2 regular, no murmurs.  Abdomen: Bowel sounds present, Non tender and not distended with no gaurding, rigidity or rebound. Extremities: B/L Lower Ext shows no edema, both legs are warm to touch Neurology: Awake alert, and oriented X 3, CN II-XII intact, Non focal Skin: No Rash  Data Review Lab Results  Component Value Date   HGBA1C 7.1 (A) 06/04/2022   HGBA1C 7.2 (A) 02/16/2022   HGBA1C 7.1 (A) 11/14/2021    Assessment & Plan   1. Type 2 diabetes mellitus without complication, without long-term current use of insulin (HCC) - POCT glycosylated hemoglobin (Hb A1C)7.1 - educated on lifestyle modifications, including but not limited to diet choices and adding exercise to daily routine.    2.Essential hypertension  Blood pressure has been elevated and remains elevated no follow-up  appointment scheduled with cardiologist.  Call cardiology for patient tomorrow at 10 AM BP  Patient have been counseled extensively about nutrition and exercise. Other issues discussed during this visit include: low cholesterol diet, weight control and daily exercise, foot care, annual eye examinations at Ophthalmology, importance of adherence with medications and regular follow-up. We also discussed long term complications of uncontrolled diabetes and hypertension.   Return in about 3 months (around 09/03/2022) for Diabetes/fasting.  The patient was given clear instructions to go to ER or return to medical center if symptoms don't improve, worsen or new  problems develop. The patient verbalized understanding. The patient was told to call to get lab results if they haven't heard anything in the next week.   This note has been created with Surveyor, quantity. Any transcriptional errors are unintentional.   [name], NP 06/04/2022, 4:35 PM

## 2022-06-05 ENCOUNTER — Ambulatory Visit: Payer: Medicare Other | Admitting: Internal Medicine

## 2022-06-05 ENCOUNTER — Encounter: Payer: Self-pay | Admitting: Internal Medicine

## 2022-06-05 VITALS — BP 141/96 | HR 88 | Ht 62.0 in | Wt 114.4 lb

## 2022-06-05 DIAGNOSIS — E119 Type 2 diabetes mellitus without complications: Secondary | ICD-10-CM

## 2022-06-05 DIAGNOSIS — E782 Mixed hyperlipidemia: Secondary | ICD-10-CM

## 2022-06-05 DIAGNOSIS — I1A0 Resistant hypertension: Secondary | ICD-10-CM

## 2022-06-05 NOTE — Progress Notes (Signed)
Primary Physician/Referring:  Marcia Perna, NP  Patient ID: [name], female    DOB: February 23, 1956, 66 y.o.   MRN: [id number]  Chief Complaint  Patient presents with   Hypertension    HPI:    ARABELA Hale  is a 66 y.o. AA female with history of hypertension, hyperlipidemia, type 2 diabetes mellitus.  Patient reports her father died of an MI at an unknown age, but suspects prior to 67 years old.  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 Patient is referred to our office By PCP for evaluation and management of resistant hypertension.  Patient presents for follow-up visit. She has been doing well but she is very stressed out in life right now. Her blood pressure is slightly elevated but she has a lot going on and is taking care of many people right now. She states her blood pressure would be great if she was not so anxious right now. She is tolerating her medications without issues and her pressure was doing well on this current regiment. Patient will work on stress relieving mechanisms and is going on vacation after the holidays to treat herself.   Denies chest pain, dyspnea, dizziness, syncope, near syncope.  Denies symptoms suggestive of TIA or CVA.  Past Medical History:  Diagnosis Date   Anemia    hx   Aneurysm ([place])    small, on left side of brain    Arthritis    "all over" (06/19/2017)   Diabetes mellitus without complication ([place])    diet control, pt denies   Hyperlipidemia    Hypertension    TIA (transient ischemic attack)    pt unaware of this hx on 06/19/2017   Past Surgical History:  Procedure Laterality Date   ANTERIOR CERVICAL DECOMP/DISCECTOMY [facility]   FOOT SURGERY Left 11/2019   IR GENERIC HISTORICAL  09/21/2014   IR ANGIO INTRA EXTRACRAN SEL INTERNAL CAROTID UNI L MOD SED 09/21/2014 [name], MD [facility]   IR GENERIC HISTORICAL  09/21/2014   IR ANGIO INTRA EXTRACRAN SEL COM CAROTID INNOMINATE UNI R MOD SED 09/21/2014 [name], MD [facility]   IR GENERIC HISTORICAL  09/21/2014   IR ANGIO VERTEBRAL SEL VERTEBRAL UNI L MOD SED 09/21/2014 [name], MD [facility]   IR GENERIC HISTORICAL  09/21/2014   IR 3D INDEPENDENT WKST 09/21/2014 [name], MD [facility]   LAPAROSCOPIC CHOLECYSTECTOMY     TUBAL LIGATION     Family History  Problem Relation Age of Onset   Hypertension Mother    Arthritis Mother    Diabetes Mother    Dementia Mother    Heart attack Father    Heart disease Father    Hypertension Sister    Diabetes Sister    Hypertension Sister    Heart disease Sister    Diabetes Sister    Hypertension Brother    Heart disease Brother    Diabetes Brother    Hypertension Brother    Hypertension Brother    Hypertension Brother    Cancer Neg Hx    Colon cancer Neg Hx    Colon polyps Neg Hx    Esophageal cancer Neg Hx    Rectal cancer Neg Hx    Stomach cancer Neg Hx    Breast cancer Neg Hx  Social History   Tobacco Use   Smoking status: Never   Smokeless tobacco: Never  Substance Use Topics   Alcohol use: Yes    Alcohol/week: 3.0 standard drinks of alcohol    Types: 3 Standard drinks or equivalent per week    Comment: occ   Marital Status: Widowed   ROS  Review of Systems  Constitutional: Negative for malaise/fatigue.  Cardiovascular:  Negative for chest pain, claudication, dyspnea on exertion, leg swelling, near-syncope, orthopnea, palpitations, paroxysmal nocturnal dyspnea and syncope.  Neurological:  Negative for dizziness.    Objective  Blood pressure (!) 141/96, pulse 88, height '5\' 2"'$  (1.575 m), weight 114 lb 6.4 oz (51.9 kg), SpO2 100 %.     06/05/2022    9:55 AM 06/04/2022   11:04 AM 06/01/2022    9:37 AM  Vitals with BMI  Height '5\' 2"'$     Weight 114 lbs 6 oz 114 lbs 3 oz   BMI 24.26     Systolic 834 196 222  Diastolic 96 979 892  Pulse 88 90 87    Physical Exam Vitals reviewed.  Cardiovascular:     Rate and Rhythm: Normal rate and regular rhythm.     Pulses: Intact distal pulses.     Heart sounds: S1 normal and S2 normal. No murmur heard.    No gallop.  Pulmonary:     Effort: Pulmonary effort is normal. No respiratory distress.     Breath sounds: No wheezing, rhonchi or rales.  Musculoskeletal:     Right lower leg: No edema.     Left lower leg: No edema.  Neurological:     Mental Status: She is alert.   Physical exam unchanged compared to previous office visit.  Laboratory examination:   Recent Labs    10/03/21 1849 10/18/21 2019 01/22/22 1218 04/06/22 0947  NA 139 142 142 140  K 3.8 3.7 4.0 4.7  CL 107 107 105 99  CO2 '23 27 27 25  '$ GLUCOSE 141* 118* 140* 120*  BUN '21 18 15 16  '$ CREATININE 0.93 0.72 0.64 0.74  CALCIUM 10.0 9.4 9.4 10.2  GFRNONAA >60 >60 >60  --    CrCl cannot be calculated (Patient's most recent lab result is older than the maximum 21 days allowed.).     Latest Ref Rng & Units 04/06/2022    9:47 AM 01/22/2022   12:18 PM 10/18/2021    8:19 PM  CMP  Glucose 70 - 99 mg/dL 120  140  118   BUN 8 - 27 mg/dL '16  15  18   '$ Creatinine 0.57 - 1.00 mg/dL 0.74  0.64  0.72   Sodium 134 - 144 mmol/L 140  142  142   Potassium 3.5 - 5.2 mmol/L 4.7  4.0  3.7   Chloride 96 - 106 mmol/L 99  105  107   CO2 20 - 29 mmol/L '25  27  27   '$ Calcium 8.7 - 10.3 mg/dL 10.2  9.4  9.4   Total Protein 6.0 - 8.5 g/dL 7.2   7.3   Total Bilirubin 0.0 - 1.2 mg/dL <0.2   0.5   Alkaline Phos 44 - 121 IU/L 111   75   AST 0 - 40 IU/L 13   19   ALT 0 - 32 IU/L 10   14       Latest Ref Rng & Units 04/06/2022    9:47 AM 01/22/2022   12:18 PM 10/18/2021    8:19 PM  CBC  WBC 3.4 - 10.8 x10E3/uL 10.6  10.1  11.7   Hemoglobin 11.1 - 15.9 g/dL 13.1  12.4  11.8   Hematocrit 34.0 - 46.6 % 39.7  38.1  37.0   Platelets 150 - 450 x10E3/uL 380  346  321     Lipid  Panel Recent Labs    04/06/22 0947  CHOL 295*  TRIG 234*  LDLCALC 189*  HDL 61  CHOLHDL 4.8*    HEMOGLOBIN A1C Lab Results  Component Value Date   HGBA1C 7.1 (A) 06/04/2022   MPG 140 10/09/2020   TSH No results for input(s): "TSH" in the last 8760 hours.  External labs:  None  Allergies  No Known Allergies   Medications Prior to Visit:   Outpatient Medications Prior to Visit  Medication Sig Dispense Refill   amLODipine (NORVASC) 10 MG tablet Take 1 tablet (10 mg total) by mouth daily. 90 tablet 1   DULoxetine (CYMBALTA) 30 MG capsule Take 1 capsule (30 mg total) by mouth 2 (two) times daily. 120 capsule 1   losartan-hydrochlorothiazide (HYZAAR) 100-25 MG tablet Take 1 tablet by mouth daily. 90 tablet 3   meclizine (ANTIVERT) 12.5 MG tablet Take 1 tablet (12.5 mg total) by mouth 3 (three) times daily as needed for dizziness. 60 tablet 1   methocarbamol (ROBAXIN) 500 MG tablet Take 1 tablet (500 mg total) by mouth every 8 (eight) hours as needed for muscle spasms. 20 tablet 0   metoprolol succinate (TOPROL-XL) 25 MG 24 hr tablet Take 1 tablet (25 mg total) by mouth daily. (Patient taking differently: Take 50 mg by mouth daily.) 90 tablet 1   naproxen (NAPROSYN) 375 MG tablet Take 1 tablet (375 mg total) by mouth 2 (two) times daily for 7 days. (Patient taking differently: Take 375 mg by mouth 2 (two) times daily. Patient takes this daily) 14 tablet 0   pantoprazole (PROTONIX) 40 MG tablet Take 1 tablet (40 mg total) by mouth daily. 30 tablet 0   pregabalin (LYRICA) 50 MG capsule Take 50 mg by mouth 3 (three) times daily.     sitaGLIPtin (JANUVIA) 25 MG tablet Take 1 tablet (25 mg total) by mouth daily. 90 tablet 1   gabapentin (NEURONTIN) 600 MG tablet Take 600 mg by mouth at bedtime.     rosuvastatin (CRESTOR) 40 MG tablet Take 1 tablet (40 mg total) by mouth daily. (Patient not taking: Reported on 06/05/2022) 90 tablet 3   No facility-administered medications prior to  visit.   Final Medications at End of Visit    Current Meds  Medication Sig   amLODipine (NORVASC) 10 MG tablet Take 1 tablet (10 mg total) by mouth daily.   DULoxetine (CYMBALTA) 30 MG capsule Take 1 capsule (30 mg total) by mouth 2 (two) times daily.   losartan-hydrochlorothiazide (HYZAAR) 100-25 MG tablet Take 1 tablet by mouth daily.   meclizine (ANTIVERT) 12.5 MG tablet Take 1 tablet (12.5 mg total) by mouth 3 (three) times daily as needed for dizziness.   methocarbamol (ROBAXIN) 500 MG tablet Take 1 tablet (500 mg total) by mouth every 8 (eight) hours as needed for muscle spasms.   metoprolol succinate (TOPROL-XL) 25 MG 24 hr tablet Take 1 tablet (25 mg total) by mouth daily. (Patient taking differently: Take 50 mg by mouth daily.)   naproxen (NAPROSYN) 375 MG tablet Take 1 tablet (375 mg total) by mouth 2 (two) times daily for 7 days. (Patient taking differently: Take 375 mg by mouth 2 (two)  times daily. Patient takes this daily)   pantoprazole (PROTONIX) 40 MG tablet Take 1 tablet (40 mg total) by mouth daily.   pregabalin (LYRICA) 50 MG capsule Take 50 mg by mouth 3 (three) times daily.   sitaGLIPtin (JANUVIA) 25 MG tablet Take 1 tablet (25 mg total) by mouth daily.   [DISCONTINUED] gabapentin (NEURONTIN) 600 MG tablet Take 600 mg by mouth at bedtime.   Radiology:   No results found.  Cardiac Studies:   Echocardiogram 10/25/2021:  Normal LV systolic function with EF 61%. Left ventricle cavity is normal  in size. Normal left ventricular wall thickness. Normal global wall  motion. Normal diastolic filling pattern, normal LAP.  Mild (Grade I) mitral regurgitation.  Compared to 05/17/2018: G1DD is now normal otherwise no significant  change.  Renal artery duplex  10/25/2021:  No evidence of renal artery occlusive disease in either renal artery.  Normal intrarenal vascular perfusion is noted in both kidneys. Renal  length is within normal limits for both kidneys.  A simple cyst  measuring 0.6(L) x 0.6(T) x 0.6(AP) cm is seen in the right  kidney.  EKG:   10/10/2021: Sinus rhythm at a rate of 96 bpm.  Atrial enlargement.  Left axis, left anterior fascicular block.  Nonspecific T wave abnormality.  No evidence of ischemia or underlying injury pattern.  06/05/2022: sinus rhythm, left axis, LAFB. Nonspecific T wave abnormality. Unchanged from prior  Assessment     ICD-10-CM   1. Resistant hypertension  I1A.0 EKG 12-Lead    2. Mixed hyperlipidemia  E78.2     3. Diabetes mellitus type 2 in nonobese (HCC)  E11.9        Medications Discontinued During This Encounter  Medication Reason   gabapentin (NEURONTIN) 600 MG tablet Side effect (s)     No orders of the defined types were placed in this encounter.   Recommendations:   DEANIE JUPITER is a 66 y.o. AA female with history of hypertension, hyperlipidemia, type 2 diabetes mellitus.  Patient reports her father died of an MI at an unknown age, but suspects prior to 66 years old.  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 Patient is referred to our office By PCP for evaluation and management of resistant hypertension.  Resistant hypertension BP actually well controlled on current regiment Patient has a lot going on right now and is high stress which is raising her BP She will keep log and practice self care Continue current cardiac medications. Encourage low-sodium diet, less than 2000 mg daily.   Mixed hyperlipidemia Continue statin   Diabetes mellitus type 2 in nonobese [facility]) Managed by primary   Follow-up in 1 year, sooner if needed.   Floydene Flock, DO, [facility] 06/08/2022, 11:00 AM Office: [phone]

## 2022-06-06 NOTE — Progress Notes (Signed)
Subjective:   Patient ID: [name], female   DOB: 66 y.o.   MRN: [id number]   HPI Patient presents stating that she does get some burning on top of both feet and that it can be aggravating for her and this has been going on and she does have neuropathy cannot take gabapentin has tried other medicines and they are working on her from that perspective.  States it also gets sore   ROS      Objective:  Physical Exam  Neuro vas status intact with patient found to have diminishment of sharp dull vibratory bilateral does have some tingling burning in feet but I did note quite a bit of discomfort around the second MPJ bilateral with moderate fluid buildup around the joint surfaces     Assessment:  Inflammatory capsulitis around the lesser MPJs along with probability for neuropathic-like symptomatology     Plan:  H&P reviewed conditions and continue treatment for the neuropathy with her neurologist.  At this point to try to help her I did do sterile prep and I injected periarticular around the second MPJ bilateral 3 mg Dexasone Kenalog 5 mg Xylocaine advised on supportive therapy shoe gear modifications reappoint as needed  X-rays dated today indicate there is mild osteoporosis around the area I did not note advanced arthritis or other pathology no indication of stress fracture

## 2022-06-08 ENCOUNTER — Telehealth: Payer: Self-pay

## 2022-06-08 DIAGNOSIS — I1A0 Resistant hypertension: Secondary | ICD-10-CM | POA: Insufficient documentation

## 2022-06-08 NOTE — Telephone Encounter (Signed)
Lol sorry I meant MP

## 2022-06-08 NOTE — Telephone Encounter (Addendum)
Patient called with high BP readings 170/105 190/105 MP told me to tell her to take an extra metoprolol through the weekend and make f/u appt if this doesn't help. Patient will call us Monday

## 2022-06-14 ENCOUNTER — Ambulatory Visit (HOSPITAL_COMMUNITY)
Admission: EM | Admit: 2022-06-14 | Discharge: 2022-06-14 | Disposition: A | Discharge status: Home / Self Care | Payer: Medicare Other | Attending: Emergency Medicine | Admitting: Emergency Medicine

## 2022-06-14 ENCOUNTER — Encounter (HOSPITAL_COMMUNITY): Payer: Self-pay | Admitting: *Deleted

## 2022-06-14 DIAGNOSIS — R1032 Left lower quadrant pain: Secondary | ICD-10-CM | POA: Diagnosis present

## 2022-06-14 LAB — POCT URINALYSIS DIPSTICK, ED / UC
Bilirubin Urine: NEGATIVE
Glucose, UA: NEGATIVE mg/dL
Ketones, ur: NEGATIVE mg/dL
Leukocytes,Ua: NEGATIVE
Nitrite: NEGATIVE
Protein, ur: NEGATIVE mg/dL
Specific Gravity, Urine: 1.025 (ref 1.005–1.030)
Urobilinogen, UA: 0.2 mg/dL (ref 0.0–1.0)
pH: 5.5 (ref 5.0–8.0)

## 2022-06-14 MED ORDER — METHOCARBAMOL 500 MG PO TABS: 500.0000 mg | ORAL_TABLET | Freq: Three times a day (TID) | ORAL | 0 refills | Status: DC | PRN

## 2022-06-14 NOTE — ED Provider Notes (Addendum)
[place]    CSN: [id number] Arrival date & time: 06/14/22  0801      History   Chief Complaint Chief Complaint  Patient presents with   Groin Pain    HPI [name] is a 66 y.o. female.  Patient presents complaining of intermittent left lower quadrant abdominal pain times several months.  Patient reports pain is worsening and becoming more frequent.  She states that no specific range of motion or activity triggers the pain, the pain comes on randomly.  She denies any N/V/D.  She denies any dysuria.  She reports that the pain seems to be located around where she feels like her fallopian tube is.  She states that the pain is "sharp" and will start randomly with no pattern. She denies any history of pelvic pain or any GYN problems. She denies any difficulties with eating. She reports a history of cholecystitis. She reports normal BM.  She has taken Tylenol with a minimal relief of symptoms.  She reports having regular HPV screenings with her PCP.  She denies ever establishing care with a GYN doctor.  She reports some weight loss over the past few months but she states she feels like this is associated with increased stress.   Groin Pain Associated symptoms include abdominal pain (left lower quadrant).   Groin Pain Associated symptoms include abdominal pain.    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   Sepsis due to undetermined organism ([place]) 10/10/2020   Enteritis of infectious origin  10/09/2020   Diabetes mellitus type 2 in nonobese ([place]) 10/09/2020   Bilateral impacted cerumen 04/02/2019   Conductive hearing loss, bilateral 04/02/2019   Nausea and vomiting 05/17/2018   Enteritis 06/19/2017   Abdominal pain 06/18/2017   Hyperlipidemia 06/18/2017   Elevated glucose 06/18/2017   Need for hepatitis C screening test 05/23/2017   Breast pain, left 01/05/2016   Left ankle sprain 01/20/2015   Gastroenteritis 11/08/2014   Malaise 10/14/2014   Hypokalemia 10/14/2014   Essential hypertension    Bilateral wrist pain 10/06/2014   Hot flashes, menopausal 10/06/2014   Cerebral aneurysm without rupture 08/20/2014   TIA (transient ischemic attack) 08/19/2014   Accelerated hypertension 02/24/2013   Depression 02/24/2013    Past Surgical History:  Procedure Laterality Date   ANTERIOR CERVICAL DECOMP/DISCECTOMY FUSION     BACK SURGERY     CESAREAN SECTION  1986   FOOT SURGERY Left 11/2019   IR GENERIC HISTORICAL  09/21/2014   IR ANGIO INTRA EXTRACRAN SEL INTERNAL CAROTID UNI L MOD SED 09/21/2014 [name], MD [facility]   IR GENERIC HISTORICAL  09/21/2014   IR ANGIO INTRA EXTRACRAN SEL COM CAROTID INNOMINATE UNI R MOD SED 09/21/2014 [name], MD [facility]   IR GENERIC HISTORICAL  09/21/2014   IR ANGIO VERTEBRAL SEL VERTEBRAL UNI L MOD SED 09/21/2014 [name], MD [facility]  IR GENERIC HISTORICAL  09/21/2014   IR 3D INDEPENDENT WKST 09/21/2014 [name], MD [facility]   LAPAROSCOPIC CHOLECYSTECTOMY     TUBAL LIGATION      OB History     Gravida  2   Para  1   Term  1   Preterm      AB  1   Living  1      SAB  1   IAB      Ectopic      Multiple      Live Births  1            Home Medications    Prior to Admission medications   Medication Sig Start Date End Date Taking? Authorizing Provider  amLODipine (NORVASC) 10 MG tablet Take 1 tablet (10 mg total) by mouth daily. 05/15/21  Yes [name], NP  DULoxetine (CYMBALTA) 30 MG capsule Take 1 capsule (30 mg total) by mouth 2 (two) times daily. 02/16/22  Yes [name], NP  losartan-hydrochlorothiazide (HYZAAR) 100-25 MG tablet Take 1 tablet by mouth daily. 05/15/21  Yes [name], NP  metoprolol succinate (TOPROL-XL) 25 MG 24 hr tablet Take 1 tablet (25 mg total) by mouth daily. Patient taking differently: Take 50 mg by mouth daily. 06/04/22  Yes [name], NP  pantoprazole (PROTONIX) 40 MG tablet Take 1 tablet (40 mg total) by mouth daily. 05/11/22  Yes Ladene Artist, MD  pregabalin (LYRICA) 50 MG capsule Take 50 mg by mouth 3 (three) times daily. 04/30/22  Yes [provider]  sitaGLIPtin (JANUVIA) 25 MG tablet Take 1 tablet (25 mg total) by mouth daily. 11/14/21  Yes [name], NP  meclizine (ANTIVERT) 12.5 MG tablet Take 1 tablet (12.5 mg total) by mouth 3 (three) times daily as needed for dizziness. 11/14/21   [name], NP  methocarbamol (ROBAXIN) 500 MG tablet Take 1 tablet (500 mg total) by mouth every 8 (eight) hours as needed for muscle spasms. 06/14/22   Flossie Dibble, NP  rosuvastatin (CRESTOR) 40 MG tablet Take 1 tablet (40 mg total) by mouth daily. Patient not taking: Reported on 06/05/2022 04/09/22   [name], NP    Family History Family History  Problem Relation Age of Onset   Hypertension Mother    Arthritis Mother    Diabetes Mother    Dementia Mother    Heart attack Father    Heart disease Father    Hypertension Sister    Diabetes Sister    Hypertension Sister    Heart disease Sister    Diabetes Sister    Hypertension Brother    Heart disease Brother    Diabetes Brother    Hypertension Brother    Hypertension Brother    Hypertension Brother    Cancer Neg Hx    Colon cancer Neg Hx    Colon polyps Neg Hx    Esophageal cancer Neg Hx    Rectal cancer Neg Hx    Stomach cancer Neg Hx    Breast cancer Neg Hx     Social  History Social History   Tobacco Use   Smoking status: Former    Types: Cigarettes   Smokeless tobacco: Never  Vaping Use   Vaping Use: Never used  Substance Use Topics   Alcohol use: Not Currently    Comment: occasionally   Drug use: Not Currently     Allergies   Patient has no  known allergies.   Review of Systems Review of Systems  Constitutional:  Negative for activity change, appetite change, chills, fatigue and fever.  Respiratory: Negative.    Gastrointestinal:  Positive for abdominal pain (left lower quadrant). Negative for abdominal distention, constipation, diarrhea, nausea and vomiting.  Genitourinary:  Positive for pelvic pain (Left sided). Negative for decreased urine volume, difficulty urinating, dyspareunia, dysuria, flank pain, frequency, genital sores, hematuria, urgency, vaginal bleeding, vaginal discharge and vaginal pain.     Physical Exam Triage Vital Signs ED Triage Vitals  Enc Vitals Group     BP 06/14/22 0815 (!) 169/98     Pulse Rate 06/14/22 0815 70     Resp 06/14/22 0815 18     Temp 06/14/22 0815 (!) 97.4 F (36.3 C)     Temp Source 06/14/22 0815 Oral     SpO2 06/14/22 0815 96 %     Weight --      Height --      Head Circumference --      Peak Flow --      Pain Score 06/14/22 0816 10     Pain Loc --      Pain Edu? --      Excl. in [place]? --    No data found.  Updated Vital Signs BP (!) 169/98 Comment: states has not taken HTN med yet today  Pulse 70   Temp (!) 97.4 F (36.3 C) (Oral)   Resp 18   SpO2 96%     Physical Exam Vitals and nursing note reviewed.  Constitutional:      Appearance: Normal appearance. She is normal weight. She is not ill-appearing.  Abdominal:     General: Bowel sounds are normal.     Palpations: Abdomen is soft. There is no shifting dullness, fluid wave, hepatomegaly, splenomegaly, mass or pulsatile mass.     Tenderness: There is no abdominal tenderness. There is no right CVA tenderness, left CVA  tenderness, guarding or rebound. Negative signs include McBurney's sign.     Hernia: No hernia is present.       Comments: Patient located pain to left sided pelvic region along mons pubis, no tenderness upon palpation of left pelvic region. No changes to the skin within left pelvic region.  No hernia noted upon exam.   Neurological:     Mental Status: She is alert.      UC Treatments / Results  Labs (all labs ordered are listed, but only abnormal results are displayed) Labs Reviewed  POCT URINALYSIS DIPSTICK, ED / UC - Abnormal; Notable for the following components:      Result Value   Hgb urine dipstick TRACE (*)    All other components within normal limits  URINE CULTURE    EKG   Radiology No results found.  Procedures Procedures (including critical care time)  Medications Ordered in UC Medications - No data to display  Initial Impression / Assessment and Plan / UC Course  I have reviewed the triage vital signs and the nursing notes.  Pertinent labs & imaging results that were available during my care of the patient were reviewed by me and considered in my medical decision making (see chart for details).     Patient presents due to left lower quadrant abdominal pain.  Based on symptomology and physical assessment, unclear etiology of symptoms. Based on assessment and patient presentation, emergency room visit doesn't seem warranted at this time. Previous lab work on 04/06/2022 was reviewed by this Probation officer.  Urinalysis ordered showed trace blood.  Urine culture ordered to rule out bacterial etiology.  Low suspicion of UTI.  Patient denies any vaginal symptoms, low suspicion of STI related illness. Patient was made aware to contact her PCP to have a pelvic ultrasound ordered.  Slight suspicion that pain may be related to muscle inflammation etiology. Robaxin was refilled for patient, patient was made aware to start this medication regiment.  Patient has taken this in the  past for previous muscle skeletal problem.  Patient was made aware of medication regiment.  She was made aware of red flag symptoms that would warrant an emergency department visit.  Patient verbalized understanding of instructions.   Charting was provided using a a verbal dictation system, charting was proofread for errors, errors may occur which could change the meaning of the information charted.   Final Clinical Impressions(s) / UC Diagnoses   Final diagnoses:  Left lower quadrant abdominal pain     Discharge Instructions      Please contact your PCP for follow-up and to have them order a pelvic ultrasound.   In the meantime, I have sent a refill of the Robaxin to your pharmacy, you may take this every 8 hours as needed.   If you develop any severe/concerning symptoms at home like severe abdominal pain please go to the nearest emergency department.      ED Prescriptions     Medication Sig Dispense Auth. Provider   methocarbamol (ROBAXIN) 500 MG tablet Take 1 tablet (500 mg total) by mouth every 8 (eight) hours as needed for muscle spasms. 20 tablet Flossie Dibble, NP      PDMP not reviewed this encounter.   Flossie Dibble, NP 06/14/22 0942    Flossie Dibble, NP 06/14/22 0943    Flossie Dibble, NP 06/14/22 0945    Flossie Dibble, NP 06/14/22 [phone]

## 2022-06-14 NOTE — Discharge Instructions (Addendum)
Please contact your PCP for follow-up and to have them order a pelvic ultrasound.   In the meantime, I have sent a refill of the Robaxin to your pharmacy, you may take this every 8 hours as needed.   If you develop any severe/concerning symptoms at home like severe abdominal pain please go to the nearest emergency department.

## 2022-06-14 NOTE — ED Triage Notes (Signed)
C/O intermittent left groin pains "for months", which has become more constant over the past month. Describes as shooting intense pain. Pt states unsure when asked if any bulging or lumps are present to area.

## 2022-06-15 ENCOUNTER — Ambulatory Visit: Payer: Self-pay

## 2022-06-15 LAB — URINE CULTURE
Culture: NO GROWTH
Special Requests: NORMAL

## 2022-06-15 NOTE — Telephone Encounter (Signed)
Patient was evaluated at [facility] on yesterday for sx's.   Attempt to call patient at listed phone numbers.  Left message on voicemail to return call- [phone]. Other number that is listed in invalid.

## 2022-06-15 NOTE — Telephone Encounter (Signed)
    Chief Complaint: Left lower abdominal pain Symptoms: Above Frequency: Weeks Pertinent Negatives: Patient denies fever Disposition: '[]'$ ED /'[]'$ Urgent Care (no appt availability in office) / '[]'$ Appointment(In office/virtual)/ '[]'$  [facility]/ '[]'$ Home Care/ '[]'$ Refused Recommended Disposition /'[]'$  Mobile Bus/ '[x]'$  Follow-up with PCP Additional Notes: No availability today. Asking to be seen as soon as possible.  Answer Assessment - Initial Assessment Questions 1. LOCATION: "Where does it hurt?"      Left lower side 2. RADIATION: "Does the pain shoot anywhere else?" (e.g., chest, back)     Back 3. ONSET: "When did the pain begin?" (e.g., minutes, hours or days ago)      This week 4. SUDDEN: "Gradual or sudden onset?"     Gradual 5. PATTERN "Does the pain come and go, or is it constant?"    - If it comes and goes: "How long does it last?" "Do you have pain now?"     (Note: Comes and goes means the pain is intermittent. It goes away completely between bouts.)    - If constant: "Is it getting better, staying the same, or getting worse?"      (Note: Constant means the pain never goes away completely; most serious pain is constant and gets worse.)      Constant 6. SEVERITY: "How bad is the pain?"  (e.g., Scale 1-10; mild, moderate, or severe)    - MILD (1-3): Doesn't interfere with normal activities, abdomen soft and not tender to touch.     - MODERATE (4-7): Interferes with normal activities or awakens from sleep, abdomen tender to touch.     - SEVERE (8-10): Excruciating pain, doubled over, unable to do any normal activities.       10 7. RECURRENT SYMPTOM: "Have you ever had this type of stomach pain before?" If Yes, ask: "When was the last time?" and "What happened that time?"      No 8. CAUSE: "What do you think is causing the stomach pain?"     unsure 9. RELIEVING/AGGRAVATING FACTORS: "What makes it better or worse?" (e.g., antacids, bending or twisting motion, bowel  movement)     No 10. OTHER SYMPTOMS: "Do you have any other symptoms?" (e.g., back pain, diarrhea, fever, urination pain, vomiting)       No 11. PREGNANCY: "Is there any chance you are pregnant?" "When was your last menstrual period?"       No  Protocols used: Abdominal Pain - [facility]

## 2022-06-16 ENCOUNTER — Other Ambulatory Visit: Payer: Self-pay

## 2022-06-16 ENCOUNTER — Emergency Department (HOSPITAL_COMMUNITY)
Admission: EM | Admit: 2022-06-16 | Discharge: 2022-06-16 | Disposition: A | Discharge status: Home / Self Care | Payer: Medicare Other | Attending: Emergency Medicine | Admitting: Emergency Medicine

## 2022-06-16 ENCOUNTER — Encounter (HOSPITAL_COMMUNITY): Payer: Self-pay

## 2022-06-16 DIAGNOSIS — R222 Localized swelling, mass and lump, trunk: Secondary | ICD-10-CM | POA: Diagnosis present

## 2022-06-16 DIAGNOSIS — L03312 Cellulitis of back [any part except buttock]: Secondary | ICD-10-CM | POA: Insufficient documentation

## 2022-06-16 MED ORDER — DOXYCYCLINE HYCLATE 100 MG PO CAPS: 100.0000 mg | ORAL_CAPSULE | Freq: Two times a day (BID) | ORAL | 0 refills | Status: DC

## 2022-06-16 MED ORDER — HYDROXYZINE HCL 25 MG PO TABS: 25.0000 mg | ORAL_TABLET | Freq: Four times a day (QID) | ORAL | 1 refills | Status: DC

## 2022-06-16 MED ORDER — IBUPROFEN 600 MG PO TABS: 600.0000 mg | ORAL_TABLET | Freq: Four times a day (QID) | ORAL | 0 refills | Status: DC | PRN

## 2022-06-16 NOTE — ED Provider Notes (Signed)
[facility] DEPT Provider Note   CSN: [id number] Arrival date & time: 06/16/22  1848     History No chief complaint on file.   [name] is a 66 y.o. female presenting today with discomfort on her back.  She says that yesterday she thinks something bit her and now she has a painful lump to the left side of her back.  No fevers or chills.  No drainage noted.  HPI     Home Medications Prior to Admission medications   Medication Sig Start Date End Date Taking? Authorizing Provider  doxycycline (VIBRAMYCIN) 100 MG capsule Take 1 capsule (100 mg total) by mouth 2 (two) times daily. 06/16/22  Yes Roshon Duell A, PA-C  hydrOXYzine (ATARAX) 25 MG tablet Take 1 tablet (25 mg total) by mouth every 6 (six) hours. 06/16/22  Yes Anzlee Hinesley A, PA-C  ibuprofen (ADVIL) 600 MG tablet Take 1 tablet (600 mg total) by mouth every 6 (six) hours as needed. 06/16/22  Yes Damean Poffenberger A, PA-C  amLODipine (NORVASC) 10 MG tablet Take 1 tablet (10 mg total) by mouth daily. 05/15/21   [name], NP  DULoxetine (CYMBALTA) 30 MG capsule Take 1 capsule (30 mg total) by mouth 2 (two) times daily. 02/16/22   [name], NP  losartan-hydrochlorothiazide (HYZAAR) 100-25 MG tablet Take 1 tablet by mouth daily. 05/15/21   [name], NP  meclizine (ANTIVERT) 12.5 MG tablet Take 1 tablet (12.5 mg total) by mouth 3 (three) times daily as needed for dizziness. 11/14/21   [name], NP  methocarbamol (ROBAXIN) 500 MG tablet Take 1 tablet (500 mg total) by mouth every 8 (eight) hours as needed for muscle spasms. 06/14/22   Flossie Dibble, NP  metoprolol succinate (TOPROL-XL) 25 MG 24 hr tablet Take 1 tablet (25 mg total) by mouth daily. Patient taking differently: Take 50 mg by mouth daily. 06/04/22   [name], NP  pantoprazole (PROTONIX) 40 MG tablet Take 1 tablet (40 mg total) by mouth daily. 05/11/22   Ladene Artist,  MD  pregabalin (LYRICA) 50 MG capsule Take 50 mg by mouth 3 (three) times daily. 04/30/22   [provider]  rosuvastatin (CRESTOR) 40 MG tablet Take 1 tablet (40 mg total) by mouth daily. Patient not taking: Reported on 06/05/2022 04/09/22   [name], NP  sitaGLIPtin (JANUVIA) 25 MG tablet Take 1 tablet (25 mg total) by mouth daily. 11/14/21   [name], NP      Allergies    Patient has no known allergies.    Review of Systems   Review of Systems  Physical Exam Updated Vital Signs BP (!) 203/144 (BP Location: Left Arm)   Pulse 99   Temp 98 F (36.7 C) (Oral)   Resp 16   Ht '5\' 2"'$  (1.575 m)   Wt 52.6 kg   SpO2 100%   BMI 21.22 kg/m  Physical Exam Vitals and nursing note reviewed.  Constitutional:      Appearance: Normal appearance.  HENT:     Head: Normocephalic and atraumatic.  Eyes:     General: No scleral icterus.    Conjunctiva/sclera: Conjunctivae normal.  Pulmonary:     Effort: Pulmonary effort is normal. No respiratory distress.  Skin:    Findings: No rash.     Comments: Insect bite to the left back over the scapula.  Surrounding cellulitis and induration.  No fluctuance  Neurological:  Mental Status: She is alert.  Psychiatric:        Mood and Affect: Mood normal.     ED Results / Procedures / Treatments   Labs (all labs ordered are listed, but only abnormal results are displayed) Labs Reviewed - No data to display  EKG None  Radiology No results found.  Procedures Procedures   Medications Ordered in ED Medications - No data to display  ED Course/ Medical Decision Making/ A&P                           Medical Decision Making Risk Prescription drug management.   66 year old female presenting today with a bug bite on her back.  It has been there for 2 days.  She does not have systemic symptoms.  She is hypertensive and she attributes this to pain because she has been taking her antihypertensives.  No headache,  chest pain, blurred vision, back pain or any other signs of hypertensive emergency.  She will be treated with antibiotics, ibuprofen and hydroxyzine for itching.  She is agreeable to this plan.  Return precautions discussed.    Final Clinical Impression(s) / ED Diagnoses Final diagnoses:  Cellulitis of back except buttock    Rx / DC Orders ED Discharge Orders          Ordered    doxycycline (VIBRAMYCIN) 100 MG capsule  2 times daily        06/16/22 1956    hydrOXYzine (ATARAX) 25 MG tablet  Every 6 hours        06/16/22 1956    ibuprofen (ADVIL) 600 MG tablet  Every 6 hours PRN        06/16/22 1956           Results and diagnoses were explained to the patient. Return precautions discussed in full. Patient had no additional questions and expressed complete understanding.   This chart was dictated using voice recognition software.  Despite best efforts to proofread,  errors can occur which can change the documentation meaning.     [name], PA-C 06/16/22 2006    [address] DO 06/16/22 2030

## 2022-06-16 NOTE — ED Triage Notes (Signed)
Stayed the night with friend and woke up with insect bites on back. Itching.

## 2022-06-16 NOTE — Discharge Instructions (Signed)
You came to the emergency department today with a bump that was painful on your back.  It looks like something that you now you have cellulitis.  I am starting you on antibiotics.  These are to be taken in the morning and at night.  They may upset your stomach so take it with food.  Ibuprofen is for your pain and hydroxyzine is for your itching.  Cold rags may also help you.  It was a pleasure to meet you and we hope you feel better!

## 2022-06-19 ENCOUNTER — Emergency Department (HOSPITAL_COMMUNITY)
Admission: EM | Admit: 2022-06-19 | Discharge: 2022-06-19 | Discharge status: Against Medical Advice | Payer: Medicare Other | Attending: Emergency Medicine | Admitting: Emergency Medicine

## 2022-06-19 ENCOUNTER — Encounter (HOSPITAL_COMMUNITY): Payer: Self-pay

## 2022-06-19 ENCOUNTER — Ambulatory Visit (INDEPENDENT_AMBULATORY_CARE_PROVIDER_SITE_OTHER): Payer: Self-pay

## 2022-06-19 DIAGNOSIS — R6883 Chills (without fever): Secondary | ICD-10-CM | POA: Diagnosis not present

## 2022-06-19 DIAGNOSIS — Z5321 Procedure and treatment not carried out due to patient leaving prior to being seen by health care provider: Secondary | ICD-10-CM | POA: Diagnosis not present

## 2022-06-19 DIAGNOSIS — S20462A Insect bite (nonvenomous) of left back wall of thorax, initial encounter: Secondary | ICD-10-CM | POA: Insufficient documentation

## 2022-06-19 DIAGNOSIS — W57XXXA Bitten or stung by nonvenomous insect and other nonvenomous arthropods, initial encounter: Secondary | ICD-10-CM | POA: Diagnosis not present

## 2022-06-19 DIAGNOSIS — R110 Nausea: Secondary | ICD-10-CM | POA: Diagnosis not present

## 2022-06-19 LAB — CBC WITH DIFFERENTIAL/PLATELET
Abs Immature Granulocytes: 0.1 10*3/uL — ABNORMAL HIGH (ref 0.00–0.07)
Basophils Absolute: 0.1 10*3/uL (ref 0.0–0.1)
Basophils Relative: 0 %
Eosinophils Absolute: 0.1 10*3/uL (ref 0.0–0.5)
Eosinophils Relative: 1 %
HCT: 39.7 % (ref 36.0–46.0)
Hemoglobin: 12.6 g/dL (ref 12.0–15.0)
Immature Granulocytes: 1 %
Lymphocytes Relative: 13 %
Lymphs Abs: 1.8 10*3/uL (ref 0.7–4.0)
MCH: 26.8 pg (ref 26.0–34.0)
MCHC: 31.7 g/dL (ref 30.0–36.0)
MCV: 84.3 fL (ref 80.0–100.0)
Monocytes Absolute: 0.8 10*3/uL (ref 0.1–1.0)
Monocytes Relative: 6 %
Neutro Abs: 11.8 10*3/uL — ABNORMAL HIGH (ref 1.7–7.7)
Neutrophils Relative %: 79 %
Platelets: 383 10*3/uL (ref 150–400)
RBC: 4.71 MIL/uL (ref 3.87–5.11)
RDW: 13.7 % (ref 11.5–15.5)
WBC: 14.7 10*3/uL — ABNORMAL HIGH (ref 4.0–10.5)
nRBC: 0 % (ref 0.0–0.2)

## 2022-06-19 LAB — BASIC METABOLIC PANEL
Anion gap: 9 (ref 5–15)
BUN: 14 mg/dL (ref 8–23)
CO2: 24 mmol/L (ref 22–32)
Calcium: 9.6 mg/dL (ref 8.9–10.3)
Chloride: 109 mmol/L (ref 98–111)
Creatinine, Ser: 0.94 mg/dL (ref 0.44–1.00)
GFR, Estimated: >60 mL/min (ref >60)
Glucose, Bld: 154 mg/dL — ABNORMAL HIGH (ref 70–99)
Potassium: 3.8 mmol/L (ref 3.5–5.1)
Sodium: 142 mmol/L (ref 135–145)

## 2022-06-19 NOTE — ED Triage Notes (Signed)
Pt arrived via POV, c/o worsening pain to insect bites on back. States she was seen, given abx but unable to fill rx.

## 2022-06-19 NOTE — ED Provider Triage Note (Signed)
Emergency Medicine Provider Triage Evaluation Note  [name] , a 65 y.o. female  was evaluated in triage.  Pt complains of worsening wound on her left upper back.  Seen in the ED 3 days ago for a bug bite that appears to have gotten infected.  Was prescribed doxycycline and an anti-inflammatory.  Patient states she was unable to pick up the antibiotics as it was $30.  States ibuprofen has not helped.  Now with nausea and chills, though unsure of fevers.  Denies neck stiffness.  Unsure of drainage from the site.  Review of Systems  Positive:  Negative: See above  Physical Exam  BP (!) 157/103 (BP Location: Left Arm)   Pulse 100   Temp 98 F (36.7 C) (Oral)   SpO2 100%  Gen:   Awake, no distress, uncomfortable appearing Resp:  Normal effort  MSK:   Moves extremities without difficulty  Other:     Medical Decision Making  Medically screening exam initiated at 10:23 AM.  Appropriate orders placed.  [name] was informed that the remainder of the evaluation will be completed by another provider, this initial triage assessment does not replace that evaluation, and the importance of remaining in the ED until their evaluation is complete.     Prince Rome, PA-C 09[birth year] 1028

## 2022-06-19 NOTE — Telephone Encounter (Signed)
Pt c/o severe pain to right upper back bug bite. Pt is now vomiting. Pt was seen for bug bite and could not afford ABX. Advised to go to ED now for evaluation. Pt stated that rash is spreading.  Reason for Disposition  [1] Red or very tender (to touch) area AND [2] getting larger over 48 hours after the bite  Answer Assessment - Initial Assessment Questions 1. TYPE of INSECT: "What type of insect was it?"      unknown 2. ONSET: "When did you get bitten?"      Last week 3. LOCATION: "Where is the insect bite located?"      Upper right hand shoulder 4. REDNESS: "Is the area red or pink?" If Yes, ask: "What size is area of redness?" (inches or cm). "When did the redness start?"     Yes w/ spreading rash 5. PAIN: "Is there any pain?" If Yes, ask: "How bad is it?"  (Scale 1-10; or mild, moderate, severe)     Severe 10/10 6. ITCHING: "Does it itch?" If Yes, ask: "How bad is the itch?"    - MILD: doesn't interfere with normal activities   - MODERATE-SEVERE: interferes with work, school, sleep, or other activities      N/a 7. SWELLING: "How big is the swelling?" (inches, cm, or compare to coins)     N/a 8. OTHER SYMPTOMS: "Do you have any other symptoms?"  (e.g., difficulty breathing, hives)     vomiting 9. PREGNANCY: "Is there any chance you are pregnant?" "When was your last menstrual period?"     N/a  Protocols used: Insect Bite-A-AH

## 2022-06-19 NOTE — Telephone Encounter (Signed)
PCP out of office today.  Will route as FYI.  Patient being evaluated in ED.

## 2022-06-20 ENCOUNTER — Encounter (HOSPITAL_COMMUNITY): Payer: Self-pay

## 2022-06-20 ENCOUNTER — Ambulatory Visit (HOSPITAL_COMMUNITY)
Admission: EM | Admit: 2022-06-20 | Discharge: 2022-06-20 | Disposition: A | Discharge status: Home / Self Care | Payer: Medicare Other | Attending: Internal Medicine | Admitting: Internal Medicine

## 2022-06-20 ENCOUNTER — Other Ambulatory Visit: Payer: Self-pay | Admitting: Gastroenterology

## 2022-06-20 DIAGNOSIS — L02212 Cutaneous abscess of back [any part, except buttock]: Secondary | ICD-10-CM

## 2022-06-20 DIAGNOSIS — L0291 Cutaneous abscess, unspecified: Secondary | ICD-10-CM | POA: Insufficient documentation

## 2022-06-20 MED ORDER — SULFAMETHOXAZOLE-TRIMETHOPRIM 800-160 MG PO TABS: 1.0000 | ORAL_TABLET | Freq: Two times a day (BID) | ORAL | 0 refills | Status: DC

## 2022-06-20 NOTE — ED Triage Notes (Signed)
Pt is here for insect bite on back has been red and swollen . Pt was seen at [facility] and was prescribed a antibiotic that, pt could not afford .

## 2022-06-20 NOTE — ED Provider Notes (Addendum)
[facility]   [id number] 06/20/22 Arrival Time: 0802  ASSESSMENT & PLAN:  1. Cutaneous abscess of back (any part, except buttock)    Patient presenting with a purulent abscess.  Risks/Benefits were discussed and patient wishes to proceed with I&D today.  She tolerated the procedure well.  A wound culture was collected to be sent to the lab.  I will treat her empirically with Bactrim.  All questions were answered and she agrees to plan.  Procedure Note: Procedure performed: I&D of cutaneous abscess of back  The skin was prepped with betadine x3. The surrounding soft tissue was anesthetized with 3cc of 1% lidocaine with epi. An 11 blade was used to make a small linear 1cm incision across the most prominent aspect of the abscesss. Bloody purulent fluid was expressed from the incision with manual pressure. A wound culture was collected to be sent to the lab. Hemostasis was achieved with manual pressure after all diacharge was expressed. The wound was bandaged. She tolerated the procedure well.    Meds ordered this encounter  Medications   DISCONTD: sulfamethoxazole-trimethoprim (BACTRIM DS) 800-160 MG tablet    Sig: Take 1 tablet by mouth 2 (two) times daily for 7 days.    Dispense:  14 tablet    Refill:  0     Discharge Instructions      Keep your wound covered with a bandage Take the antibiotic 2x per day for 1 week Go to closest ER if fever, chills, altered mental status develop        Reviewed expectations re: course of current medical issues. Questions answered. Outlined signs and symptoms indicating need for more acute intervention. Patient verbalized understanding. After Visit Summary given.   SUBJECTIVE:  Pleasant 66 year old female comes urgent care to be evaluated for bump on her back.  She thinks she had a spider bite there.  She first noticed it on Friday.  It started as a Red spot on left upper back.  It then got bigger and developed a whitehead.   Denies any spread or other lesions.  She was previously seen at [facility], given rx for oral antibiotic but could not afford it.  Has not taken any oral antibiotics.  Has been putting Neosporin on it.  Risk factors include diabetes, which patient reports is well-controlled.  Denies any fevers, chills, chest pain, shortness of breath.   No LMP recorded. Patient is postmenopausal. Past Surgical History:  Procedure Laterality Date   ANTERIOR CERVICAL DECOMP/DISCECTOMY FUSION     BACK SURGERY     CESAREAN SECTION  1986   FOOT SURGERY Left 11/2019   IR GENERIC HISTORICAL  09/21/2014   IR ANGIO INTRA EXTRACRAN SEL INTERNAL CAROTID UNI L MOD SED 09/21/2014 [name], MD [facility]   IR GENERIC HISTORICAL  09/21/2014   IR ANGIO INTRA EXTRACRAN SEL COM CAROTID INNOMINATE UNI R MOD SED 09/21/2014 [name], MD [facility]   IR GENERIC HISTORICAL  09/21/2014   IR ANGIO VERTEBRAL SEL VERTEBRAL UNI L MOD SED 09/21/2014 [name], MD [facility]   IR GENERIC HISTORICAL  09/21/2014   IR 3D INDEPENDENT WKST 09/21/2014 [name], MD [facility]   LAPAROSCOPIC CHOLECYSTECTOMY     TUBAL LIGATION       OBJECTIVE:  Vitals:   06/20/22 0815  BP: (!) 146/78  Pulse: 88  Resp: 16  Temp: 99 F (37.2 C)  TempSrc: Oral  SpO2: 97%     Physical Exam Vitals and nursing note  reviewed.  Constitutional:      Appearance: She is not ill-appearing.  HENT:     Head: Normocephalic.  Cardiovascular:     Rate and Rhythm: Normal rate.  Pulmonary:     Effort: Pulmonary effort is normal.  Musculoskeletal:        General: Normal range of motion.  Skin:    Comments: Left upper Back - 4 x 3 cm raised abscess with surrounding local erythema at inferior border of scapula.  Purulent discharge coming out of pores.  Neurological:     General: No focal deficit present.     Mental Status: She is alert.  Psychiatric:        Mood and Affect: Mood normal.      Labs: Results for  orders placed or performed during the hospital encounter of 06/20/22  Aerobic Culture w Gram Stain (superficial specimen)   Specimen: Back  Result Value Ref Range   Specimen Description BACK    Special Requests NONE    Gram Stain      MODERATE WBC PRESENT, PREDOMINANTLY PMN MODERATE GRAM POSITIVE COCCI IN CLUSTERS Performed at [facility], [address], [place], [place] [address]    Culture      ABUNDANT METHICILLIN RESISTANT STAPHYLOCOCCUS AUREUS   Report Status 06/22/2022 FINAL    Organism ID, Bacteria METHICILLIN RESISTANT STAPHYLOCOCCUS AUREUS       Susceptibility   Methicillin resistant staphylococcus aureus - MIC*    CIPROFLOXACIN 4 RESISTANT Resistant     ERYTHROMYCIN >=8 RESISTANT Resistant     GENTAMICIN <=0.5 SENSITIVE Sensitive     OXACILLIN >=4 RESISTANT Resistant     TETRACYCLINE <=1 SENSITIVE Sensitive     VANCOMYCIN 1 SENSITIVE Sensitive     TRIMETH/SULFA <=10 SENSITIVE Sensitive     CLINDAMYCIN <=0.25 SENSITIVE Sensitive     RIFAMPIN <=0.5 SENSITIVE Sensitive     Inducible Clindamycin NEGATIVE Sensitive     * ABUNDANT METHICILLIN RESISTANT STAPHYLOCOCCUS AUREUS   Labs Reviewed  AEROBIC CULTURE W GRAM STAIN (SUPERFICIAL SPECIMEN)    Imaging: No results found.   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, on left side of brain    Arthritis    "all over" (06/19/2017)   Diabetes mellitus without complication ([place])    diet control, pt denies   Hyperlipidemia    Hypertension    TIA (transient ischemic attack)    pt unaware of this hx on 06/19/2017    Social History   Socioeconomic History   Marital status: Widowed    Spouse name: Not on file   Number of children: 1   Years of education: Not on file   Highest education level: Some college, no degree  Occupational History   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   Social Determinants of Radio broadcast assistant Strain: [place]  (12/15/2021)   Overall Financial Resource Strain (CARDIA)    Difficulty of Paying Living Expenses: Not hard at all  Food Insecurity: No Food Insecurity (12/15/2021)   Hunger Vital Sign    Worried About Running Out of Food in the Last Year: Never true    Ran Out of Food in the Last Year: Never true  Transportation Needs: No Transportation Needs (12/15/2021)   PRAPARE - Hydrologist (Medical): No    Lack of Transportation (Non-Medical): No  Physical Activity: Inactive (12/15/2021)   Exercise Vital Sign    Days of Exercise per Week: 0 days    Minutes of Exercise per Session: 10 min  Stress: No Stress Concern Present (12/15/2021)   [place]    Feeling of Stress : Only a little  Social Connections: Socially Isolated (12/15/2021)   Social Connection and Isolation Panel [NHANES]    Frequency of Communication with Friends and Family: More than three times a week    Frequency of Social Gatherings with Friends and Family: Twice a week    Attends Religious Services: Never    Marine scientist or Organizations: No    Attends Archivist Meetings: Never    Marital Status: Widowed  Intimate Partner Violence: Not At Risk (12/15/2021)   Humiliation, Afraid, Rape, and Kick questionnaire    Fear of Current or Ex-Partner: No    Emotionally Abused: No     Physically Abused: No    Sexually Abused: No    Family History  Problem Relation Age of Onset   Hypertension Mother    Arthritis Mother    Diabetes Mother    Dementia Mother    Heart attack Father    Heart disease Father    Hypertension Sister    Diabetes Sister    Hypertension Sister    Heart disease Sister    Diabetes Sister    Hypertension Brother    Heart disease Brother    Diabetes Brother    Hypertension Brother    Hypertension Brother    Hypertension Brother    Cancer Neg Hx    Colon cancer Neg Hx    Colon polyps Neg Hx    Esophageal cancer Neg Hx    Rectal cancer Neg Hx    Stomach cancer Neg Hx    Breast cancer Neg Hx       Jamilla Galli, Dorian Pod, MD 06/20/22 1000    Donni Oglesby, Dorian Pod, MD 06/27/22 [id number]

## 2022-06-20 NOTE — Discharge Instructions (Addendum)
Keep your wound covered with a bandage Take the antibiotic 2x per day for 1 week Go to closest ER if fever, chills, altered mental status develop

## 2022-06-21 ENCOUNTER — Telehealth (INDEPENDENT_AMBULATORY_CARE_PROVIDER_SITE_OTHER): Payer: Self-pay | Admitting: *Deleted

## 2022-06-21 NOTE — Telephone Encounter (Signed)
Will follow up with patient

## 2022-06-22 ENCOUNTER — Other Ambulatory Visit (INDEPENDENT_AMBULATORY_CARE_PROVIDER_SITE_OTHER): Payer: Self-pay | Admitting: Primary Care

## 2022-06-22 ENCOUNTER — Encounter (HOSPITAL_COMMUNITY): Payer: Self-pay

## 2022-06-22 ENCOUNTER — Other Ambulatory Visit: Payer: Self-pay | Admitting: Gastroenterology

## 2022-06-22 ENCOUNTER — Ambulatory Visit (HOSPITAL_COMMUNITY)
Admission: EM | Admit: 2022-06-22 | Discharge: 2022-06-22 | Disposition: A | Discharge status: Home / Self Care | Payer: Medicare Other | Attending: Emergency Medicine | Admitting: Emergency Medicine

## 2022-06-22 DIAGNOSIS — Z5189 Encounter for other specified aftercare: Secondary | ICD-10-CM

## 2022-06-22 DIAGNOSIS — L02212 Cutaneous abscess of back [any part, except buttock]: Secondary | ICD-10-CM

## 2022-06-22 LAB — AEROBIC CULTURE W GRAM STAIN (SUPERFICIAL SPECIMEN)

## 2022-06-22 MED ORDER — DOXYCYCLINE MONOHYDRATE 25 MG/5ML PO SUSR: 100.0000 mg | Freq: Two times a day (BID) | ORAL | 0 refills | Status: AC

## 2022-06-22 MED ORDER — ONDANSETRON HCL 4 MG PO TABS: 4.0000 mg | ORAL_TABLET | Freq: Four times a day (QID) | ORAL | 0 refills | Status: DC

## 2022-06-22 NOTE — Discharge Instructions (Addendum)
I have sent the liquid formulation for doxycycline to the pharmacy, you will take this 2 times a day for 7 days.   Please follow up with wound care, the information is attached to your paperwork.

## 2022-06-22 NOTE — ED Triage Notes (Signed)
Patient is here for a wound check, she had a boil lanced and was told to come back today to have the bandage checked and changed.

## 2022-06-22 NOTE — ED Provider Notes (Signed)
[facility]    CSN: [id number] Arrival date & time: 06/22/22  0801      History   Chief Complaint Chief Complaint  Patient presents with   Wound Check    HPI [name] is a 66 y.o. female.  Patient presents complaining of abscess on left upper back that has been ongoing for approximately 1 week .  She presented to this clinic on 06/20/2022 when an I&D was performed.  Patient states that she has been prescribed Bactrim from Urgent Care and Doxycycline from the Emergency Department, but only started taking doxycycline yesterday. She states that she was unable to pick up the bactrim, but  started taking the doxycycline yesterday but reports difficulty with swallowing the capsules.  Patient reports that she has had purulent drainage from the site.  She reports that the pain has worsened.  She reports that she has had some episodes of chills but no known fever.  Patient has history of diabetes mellitus, she states that her blood sugars have been under control per her PCP.    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   Sepsis due to undetermined organism ([place]) 10/10/2020   Enteritis of infectious origin 10/09/2020   Diabetes mellitus type 2 in nonobese ([place]) 10/09/2020   Bilateral impacted cerumen 04/02/2019   Conductive hearing loss, bilateral 04/02/2019   Nausea and vomiting 05/17/2018   Enteritis 06/19/2017   Abdominal pain 06/18/2017   Hyperlipidemia 06/18/2017    Elevated glucose 06/18/2017   Need for hepatitis C screening test 05/23/2017   Breast pain, left 01/05/2016   Left ankle sprain 01/20/2015   Gastroenteritis 11/08/2014   Malaise 10/14/2014   Hypokalemia 10/14/2014   Essential hypertension    Bilateral wrist pain 10/06/2014   Hot flashes, menopausal 10/06/2014   Cerebral aneurysm without rupture 08/20/2014   TIA (transient ischemic attack) 08/19/2014   Accelerated hypertension 02/24/2013   Depression 02/24/2013    Past Surgical History:  Procedure Laterality Date   ANTERIOR CERVICAL DECOMP/DISCECTOMY FUSION     BACK SURGERY     CESAREAN SECTION  1986   FOOT SURGERY Left 11/2019   IR GENERIC HISTORICAL  09/21/2014   IR ANGIO INTRA EXTRACRAN SEL INTERNAL CAROTID UNI L MOD SED 09/21/2014 [name], MD [facility]   IR GENERIC HISTORICAL  09/21/2014   IR ANGIO INTRA EXTRACRAN SEL COM CAROTID INNOMINATE UNI R MOD SED 09/21/2014 [name], MD [facility]   IR GENERIC HISTORICAL  09/21/2014   IR ANGIO VERTEBRAL SEL VERTEBRAL UNI L MOD SED 09/21/2014 [name], MD [facility]   IR GENERIC HISTORICAL  09/21/2014   IR 3D INDEPENDENT WKST 09/21/2014 [name], MD [facility]   LAPAROSCOPIC CHOLECYSTECTOMY     TUBAL LIGATION      OB History     Gravida  2   Para  1  Term  1   Preterm      AB  1   Living  1      SAB  1   IAB      Ectopic      Multiple      Live Births  1            Home Medications    Prior to Admission medications   Medication Sig Start Date End Date Taking? Authorizing Provider  amLODipine (NORVASC) 10 MG tablet Take 1 tablet (10 mg total) by mouth daily. 05/15/21  Yes [name], NP  doxycycline (VIBRAMYCIN) 25 MG/5ML SUSR Take 20 mLs (100 mg total) by mouth 2 (two) times daily for 7 days. 06/22/22 06/29/22 Yes Flossie Dibble, NP  DULoxetine (CYMBALTA) 30 MG capsule Take 1 capsule (30 mg total) by mouth 2 (two) times daily. 02/16/22  Yes [name], NP  hydrOXYzine (ATARAX) 25 MG tablet Take 1 tablet (25 mg total) by mouth every 6 (six) hours. 06/16/22  Yes Redwine, Madison A, PA-C  ibuprofen (ADVIL) 600 MG tablet Take 1 tablet (600 mg total) by mouth every 6 (six) hours as needed. 06/16/22  Yes Redwine, Madison A, PA-C  losartan-hydrochlorothiazide (HYZAAR) 100-25 MG tablet Take 1 tablet by mouth daily. 05/15/21  Yes [name], NP  meclizine (ANTIVERT) 12.5 MG tablet Take 1 tablet (12.5 mg total) by mouth 3 (three) times daily as needed for dizziness. 11/14/21  Yes [name], NP  metoprolol succinate (TOPROL-XL) 25 MG 24 hr tablet Take 1 tablet (25 mg total) by mouth daily. Patient taking differently: Take 50 mg by mouth daily. 06/04/22  Yes [name], NP  ondansetron (ZOFRAN) 4 MG tablet Take 1 tablet (4 mg total) by mouth every 6 (six) hours. 06/22/22  Yes Flossie Dibble, NP  pantoprazole (PROTONIX) 40 MG tablet TAKE ONE TABLET ('40mg'$  total) BY MOUTH DAILY AT 9AM 06/20/22  Yes Ladene Artist, MD  pregabalin (LYRICA) 50 MG capsule Take 50 mg by mouth 3 (three) times daily. 04/30/22  Yes [provider]  rosuvastatin (CRESTOR) 40 MG tablet Take 1 tablet (40 mg total) by mouth daily. 04/09/22  Yes [name], NP  sitaGLIPtin (JANUVIA) 25 MG tablet Take 1 tablet (25 mg total) by mouth daily. 11/14/21  Yes [name], NP  methocarbamol (ROBAXIN) 500 MG tablet Take 1 tablet (500 mg total) by mouth every 8 (eight) hours as needed for muscle spasms. 06/14/22   Flossie Dibble, NP    Family History Family History  Problem Relation Age of Onset   Hypertension Mother    Arthritis Mother    Diabetes Mother    Dementia Mother    Heart attack Father    Heart disease Father    Hypertension Sister    Diabetes Sister    Hypertension Sister    Heart disease Sister    Diabetes Sister    Hypertension Brother    Heart disease Brother    Diabetes Brother    Hypertension  Brother    Hypertension Brother    Hypertension Brother    Cancer Neg Hx    Colon cancer Neg Hx    Colon polyps Neg Hx    Esophageal cancer Neg Hx    Rectal cancer Neg Hx    Stomach cancer Neg Hx    Breast cancer Neg Hx     Social History Social History   Tobacco Use   Smoking  status: Former    Types: Cigarettes   Smokeless tobacco: Never  Vaping Use   Vaping Use: Never used  Substance Use Topics   Alcohol use: Not Currently    Comment: occasionally   Drug use: Not Currently     Allergies   Patient has no known allergies.   Review of Systems Review of Systems Per HPI  Physical Exam Triage Vital Signs ED Triage Vitals  Enc Vitals Group     BP 06/22/22 0818 136/85     Pulse Rate 06/22/22 0818 96     Resp 06/22/22 0818 18     Temp 06/22/22 0818 98.9 F (37.2 C)     Temp Source 06/22/22 0818 Oral     SpO2 06/22/22 0818 95 %     Weight --      Height --      Head Circumference --      Peak Flow --      Pain Score 06/22/22 0819 3     Pain Loc --      Pain Edu? --      Excl. in [place]? --    No data found.  Updated Vital Signs BP 136/85 (BP Location: Right Arm)   Pulse 96   Temp 98.9 F (37.2 C) (Oral)   Resp 18   SpO2 95%       Physical Exam Vitals and nursing note reviewed.  Constitutional:      Appearance: Normal appearance.  Skin:    Findings: Erythema and wound present.     Comments: Erythematous base around wound. Tenderness upon palpation. No drainage expressed from site upon examination.   Neurological:     Mental Status: She is alert.     UC Treatments / Results  Labs (all labs ordered are listed, but only abnormal results are displayed) Labs Reviewed - No data to display  EKG   Radiology No results found.  Procedures Procedures (including critical care time)  Medications Ordered in UC Medications - No data to display  Initial Impression / Assessment and Plan / UC Course  I have reviewed the triage vital signs and the  nursing notes.  Pertinent labs & imaging results that were available during my care of the patient were reviewed by me and considered in my medical decision making (see chart for details).     Patient was evaluated for wound check.  Dressing change occurred while in office.Since patient has been unable to start antibiotic regiment since symptoms started, it was stressed to patient that she will need to start doxycycline and adhere to treatment regimen.  A liquid formulation of doxycycline was sent to the pharmacy and Zofran was added to treatment regiment.  Patient was made aware of timeline for symptom resolution .Wound culture in EPIC on 06/20/2022 shows doxycycline as a susceptible regimen.  Patient was given information for wound care, this writer made patient aware to go ahead and schedule appointment for follow-up.  Patient was made aware of red flag symptoms that warrant an emergency department visit.  Patient verbalized understanding of instructions.   Charting was provided using a a verbal dictation system, charting was proofread for errors, errors may occur which could change the meaning of the information charted.   Final Clinical Impressions(s) / UC Diagnoses   Final diagnoses:  Wound check, abscess  Encounter for wound re-check     Discharge Instructions      I have sent the liquid formulation for doxycycline to the pharmacy, you  will take this 2 times a day for 7 days.   Please follow up with wound care, the information is attached to your paperwork.      ED Prescriptions     Medication Sig Dispense Auth. Provider   doxycycline (VIBRAMYCIN) 25 MG/5ML SUSR Take 20 mLs (100 mg total) by mouth 2 (two) times daily for 7 days. 240 mL Tawan Degroote N, NP   ondansetron (ZOFRAN) 4 MG tablet Take 1 tablet (4 mg total) by mouth every 6 (six) hours. 12 tablet Flossie Dibble, NP      PDMP not reviewed this encounter.   Flossie Dibble, NP 06/22/22 1002

## 2022-06-23 NOTE — Telephone Encounter (Signed)
Requested Prescriptions  Pending Prescriptions Disp Refills   DULoxetine (CYMBALTA) 30 MG capsule [Pharmacy Med Name: duloxetine 30 mg capsule,delayed release] 180 capsule 1    Sig: TAKE ONE CAPSULE BY MOUTH TWICE DAILY @ 9AM & 5PM     Psychiatry: Antidepressants - SNRI - duloxetine Passed - 06/22/2022  7:15 PM      Passed - Cr in normal range and within 360 days    Creat  Date Value Ref Range Status  02/24/2013 0.72 0.50 - 1.10 mg/dL Final   Creatinine, Ser  Date Value Ref Range Status  06/19/2022 0.94 0.44 - 1.00 mg/dL Final         Passed - eGFR is 30 or above and within 360 days    GFR calc Af Amer  Date Value Ref Range Status  05/27/2020 109 >59 mL/min/1.73 Final    Comment:    **In accordance with recommendations from the NKF-ASN Task force,**   [facility] is in the process of updating its eGFR calculation to the   2021 CKD-EPI creatinine equation that estimates kidney function   without a race variable.    GFR, Estimated  Date Value Ref Range Status  06/19/2022 >60 >60 mL/min Final    Comment:    (NOTE) Calculated using the CKD-EPI Creatinine Equation (2021)    eGFR  Date Value Ref Range Status  04/06/2022 89 >59 mL/min/1.73 Final         Passed - Completed PHQ-2 or PHQ-9 in the last 360 days      Passed - Last BP in normal range    BP Readings from Last 1 Encounters:  06/22/22 136/85         Passed - Valid encounter within last 6 months    Recent Outpatient Visits           2 weeks ago Type 2 diabetes mellitus without complication, without long-term current use of insulin ([place])   [place] Juluis Mire P, NP   2 months ago Diabetes mellitus type 2 in nonobese ([place])   [place], Michelle P, NP   4 months ago Type 2 diabetes mellitus without complication, without long-term current use of insulin ([place])   [place], Michelle P, NP   5 months ago Adjustment  disorder with mixed anxiety and depressed mood   [facility] Juluis Mire P, NP   7 months ago Type 2 diabetes mellitus without complication, without long-term current use of insulin ([place])   [place], [place], NP       Future Appointments             In 1 week [place] Elza Rafter, DO [place] [facility] [place], [facility]   In 11 months Custovic, Collene Mares, [place] [facility]

## 2022-06-25 HISTORY — PX: CARPAL TUNNEL RELEASE: SHX101

## 2022-06-26 ENCOUNTER — Ambulatory Visit (INDEPENDENT_AMBULATORY_CARE_PROVIDER_SITE_OTHER): Payer: Medicare Other | Admitting: Podiatry

## 2022-06-26 DIAGNOSIS — Z91199 Patient's noncompliance with other medical treatment and regimen due to unspecified reason: Secondary | ICD-10-CM

## 2022-06-26 NOTE — Progress Notes (Signed)
No show

## 2022-06-28 ENCOUNTER — Ambulatory Visit (INDEPENDENT_AMBULATORY_CARE_PROVIDER_SITE_OTHER): Payer: Self-pay | Admitting: *Deleted

## 2022-06-28 NOTE — Telephone Encounter (Signed)
  Chief Complaint: Wound that is being treated by the [facility] is more open. Symptoms: Wound more open and stinging.  Noticed when drsg was changed this morning Frequency: N/A Pertinent Negatives: Patient denies N/A Disposition: '[]'$ ED /'[x]'$ Urgent Care (no appt availability in office) / '[]'$ Appointment(In office/virtual)/ '[]'$  [facility]/ '[]'$ Home Care/ '[]'$ Refused Recommended Disposition /'[]'$  Mobile Bus/ '[]'$  Follow-up with PCP Additional Notes: No appts at [facility] per pt has advised her to go to the urgent care which she is agreeable to doing.

## 2022-06-28 NOTE — Telephone Encounter (Signed)
Reason for Disposition  [1] Looks infected (spreading redness, red streak, pus) AND [2] fever    [place] advised her to go to the urgent care.  Answer Assessment - Initial Assessment Questions 1. LOCATION: "Where is the wound located?"      I have an open wound that is looking infected.  When they changed my bandages it was open more than usual and it is stinging and I don't feel very well.  I get treatment at the wound care center.   I called them and they weren't able to work me in any sooner so they advised I go to the urgent care but I wanted to check with y'all first.   I let her know there weren't any appts at [facility] and that I agreed with the [place] that she should go on to the urgent care and be evaluated.     She was agreeable to this plan and is going to go on over there now to urgent care.   Triage ended at this point since she is under the care of the [place] and advised to go on to the urgent care.    2. WOUND APPEARANCE: "What does the wound look like?"      It's more open than it was and it is stinging.   I don't feel good either like something isn't right. 3. SIZE: If redness is present, ask: "What is the size of the red area?" (Inches, centimeters, or compare to size of a coin)      Not asked 4. SPREAD: "What's changed in the last day?"  "Do you see any red streaks coming from the wound?"     It's more open and it's stinging 5. ONSET: "When did it start to look infected?"      When they changed my bandages this morning. 6. MECHANISM: "How did the wound start, what was the cause?"     Not asked since under the care of the [facility] and it's not a new wound. 7. PAIN: Do you have any pain?"  If Yes, ask: "How bad is the pain?"  (e.g., Scale 1-10; mild, moderate, or severe)    - MILD (1-3): Doesn't interfere with normal activities.     - MODERATE (4-7): Interferes with normal activities or awakens from sleep.    - SEVERE  (8-10): Excruciating pain, unable to do any normal activities.       Stinging 8. FEVER: "Do you have a fever?" If Yes, ask: "What is your temperature, how was it measured, and when did it start?"     Not asked but she mentioned she just didn't feel good. 9. OTHER SYMPTOMS: "Do you have any other symptoms?" (e.g., shaking chills, weakness, rash elsewhere on body)     See above 10. PREGNANCY: "Is there any chance you are pregnant?" "When was your last menstrual period?"       N/A due to age  Protocols used: Wound Infection Suspected-A-AH

## 2022-07-06 ENCOUNTER — Encounter (HOSPITAL_BASED_OUTPATIENT_CLINIC_OR_DEPARTMENT_OTHER): Payer: Medicare HMO | Attending: Internal Medicine | Admitting: Internal Medicine

## 2022-07-06 DIAGNOSIS — E11622 Type 2 diabetes mellitus with other skin ulcer: Secondary | ICD-10-CM

## 2022-07-06 DIAGNOSIS — Z87891 Personal history of nicotine dependence: Secondary | ICD-10-CM | POA: Insufficient documentation

## 2022-07-06 DIAGNOSIS — L98428 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: Secondary | ICD-10-CM | POA: Diagnosis not present

## 2022-07-06 DIAGNOSIS — L02212 Cutaneous abscess of back [any part, except buttock]: Secondary | ICD-10-CM | POA: Diagnosis not present

## 2022-07-06 DIAGNOSIS — I10 Essential (primary) hypertension: Secondary | ICD-10-CM | POA: Insufficient documentation

## 2022-07-06 DIAGNOSIS — Z7984 Long term (current) use of oral hypoglycemic drugs: Secondary | ICD-10-CM | POA: Diagnosis not present

## 2022-07-09 DIAGNOSIS — S31000A Unspecified open wound of lower back and pelvis without penetration into retroperitoneum, initial encounter: Secondary | ICD-10-CM | POA: Diagnosis not present

## 2022-07-13 ENCOUNTER — Encounter (HOSPITAL_BASED_OUTPATIENT_CLINIC_OR_DEPARTMENT_OTHER): Payer: Medicare HMO | Admitting: Internal Medicine

## 2022-07-13 DIAGNOSIS — L98428 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: Secondary | ICD-10-CM | POA: Diagnosis not present

## 2022-07-13 DIAGNOSIS — I10 Essential (primary) hypertension: Secondary | ICD-10-CM | POA: Diagnosis not present

## 2022-07-13 DIAGNOSIS — Z7984 Long term (current) use of oral hypoglycemic drugs: Secondary | ICD-10-CM | POA: Diagnosis not present

## 2022-07-13 DIAGNOSIS — Z87891 Personal history of nicotine dependence: Secondary | ICD-10-CM | POA: Diagnosis not present

## 2022-07-13 DIAGNOSIS — E11622 Type 2 diabetes mellitus with other skin ulcer: Secondary | ICD-10-CM | POA: Diagnosis not present

## 2022-07-13 DIAGNOSIS — L02212 Cutaneous abscess of back [any part, except buttock]: Secondary | ICD-10-CM | POA: Diagnosis not present

## 2022-07-16 ENCOUNTER — Other Ambulatory Visit: Payer: Self-pay | Admitting: Gastroenterology

## 2022-07-17 ENCOUNTER — Other Ambulatory Visit (INDEPENDENT_AMBULATORY_CARE_PROVIDER_SITE_OTHER): Payer: Self-pay | Admitting: Primary Care

## 2022-07-17 DIAGNOSIS — E119 Type 2 diabetes mellitus without complications: Secondary | ICD-10-CM

## 2022-07-17 DIAGNOSIS — Z760 Encounter for issue of repeat prescription: Secondary | ICD-10-CM

## 2022-07-17 DIAGNOSIS — I10 Essential (primary) hypertension: Secondary | ICD-10-CM

## 2022-07-17 MED ORDER — LOSARTAN POTASSIUM-HCTZ 100-25 MG PO TABS: 1.0000 | ORAL_TABLET | Freq: Every day | ORAL | 1 refills | Status: DC

## 2022-07-17 MED ORDER — AMLODIPINE BESYLATE 10 MG PO TABS: 10.0000 mg | ORAL_TABLET | Freq: Every day | ORAL | 1 refills | Status: DC

## 2022-07-17 MED ORDER — SITAGLIPTIN PHOSPHATE 25 MG PO TABS: 25.0000 mg | ORAL_TABLET | Freq: Every day | ORAL | 1 refills | Status: DC

## 2022-07-17 NOTE — Telephone Encounter (Signed)
Requested Prescriptions  Pending Prescriptions Disp Refills   sitaGLIPtin (JANUVIA) 25 MG tablet 90 tablet 1    Sig: Take 1 tablet (25 mg total) by mouth daily.     Endocrinology:  Diabetes - DPP-4 Inhibitors Passed - 07/17/2022  4:35 PM      Passed - HBA1C is between 0 and 7.9 and within 180 days    HbA1c, POC (controlled diabetic range)  Date Value Ref Range Status  06/04/2022 7.1 (A) 0.0 - 7.0 % Final         Passed - Cr in normal range and within 360 days    Creat  Date Value Ref Range Status  02/24/2013 0.72 0.50 - 1.10 mg/dL Final   Creatinine, Ser  Date Value Ref Range Status  06/19/2022 0.94 0.44 - 1.00 mg/dL Final         Passed - Valid encounter within last 6 months    Recent Outpatient Visits           1 month ago Type 2 diabetes mellitus without complication, without long-term current use of insulin ([place])   [place] [name], NP   3 months ago Diabetes mellitus type 2 in nonobese [name])   [place], Michelle P, NP   5 months ago Type 2 diabetes mellitus without complication, without long-term current use of insulin ([place])   [place] [name], NP   6 months ago Adjustment disorder with mixed anxiety and depressed mood   [place] [facility] [name], NP   8 months ago Type 2 diabetes mellitus without complication, without long-term current use of insulin ([place])   [place] [name], NP       Future Appointments             In 10 months Custovic, Collene Mares, DO [facility]             losartan-hydrochlorothiazide (HYZAAR) 100-25 MG tablet 90 tablet 1    Sig: Take 1 tablet by mouth daily.     Cardiovascular: ARB + Diuretic Combos Passed - 07/17/2022  4:35 PM      Passed - K in normal range and within 180 days    Potassium  Date Value Ref Range Status   06/19/2022 3.8 3.5 - 5.1 mmol/L Final         Passed - Na in normal range and within 180 days    Sodium  Date Value Ref Range Status  06/19/2022 142 135 - 145 mmol/L Final  04/06/2022 140 134 - 144 mmol/L Final         Passed - Cr in normal range and within 180 days    Creat  Date Value Ref Range Status  02/24/2013 0.72 0.50 - 1.10 mg/dL Final   Creatinine, Ser  Date Value Ref Range Status  06/19/2022 0.94 0.44 - 1.00 mg/dL Final         Passed - eGFR is 10 or above and within 180 days    GFR calc Af Amer  Date Value Ref Range Status  05/27/2020 109 >59 mL/min/1.73 Final    Comment:    **In accordance with recommendations from the NKF-ASN Task force,**   [facility] is in the process of updating its eGFR calculation to the   2021 CKD-EPI creatinine equation that estimates kidney function   without a race variable.    GFR, Estimated  Date Value Ref Range Status  06/19/2022 >60 >60 mL/min Final    Comment:    (NOTE) Calculated using the CKD-EPI Creatinine Equation (2021)    eGFR  Date Value Ref Range Status  04/06/2022 89 >59 mL/min/1.73 Final         Passed - Patient is not pregnant      Passed - Last BP in normal range    BP Readings from Last 1 Encounters:  06/22/22 136/85         Passed - Valid encounter within last 6 months    Recent Outpatient Visits           1 month ago Type 2 diabetes mellitus without complication, without long-term current use of insulin ([place])   [place] [name], NP   3 months ago Diabetes mellitus type 2 in nonobese [facility])   [place], Michelle P, NP   5 months ago Type 2 diabetes mellitus without complication, without long-term current use of insulin ([place])   [place] [name], NP   6 months ago Adjustment disorder with mixed anxiety and depressed mood   [place] [facility] [name], NP   8 months ago Type 2 diabetes mellitus without complication, without long-term current use of insulin ([place])   [place] [name], NP       Future Appointments             In 10 months Custovic, Collene Mares, DO [facility]             amLODipine (NORVASC) 10 MG tablet 90 tablet 1    Sig: Take 1 tablet (10 mg total) by mouth daily.     Cardiovascular: Calcium Channel Blockers 2 Passed - 07/17/2022  4:35 PM      Passed - Last BP in normal range    BP Readings from Last 1 Encounters:  06/22/22 136/85         Passed - Last Heart Rate in normal range    Pulse Readings from Last 1 Encounters:  06/22/22 96         Passed - Valid encounter within last 6 months    Recent Outpatient Visits           1 month ago Type 2 diabetes mellitus without complication, without long-term current use of insulin ([place])   [place] [name], NP   3 months ago Diabetes mellitus type 2 in nonobese [facility])   [place], Michelle P, NP   5 months ago Type 2 diabetes mellitus without complication, without long-term current use of insulin ([place])   [place], Michelle P, NP   6 months ago Adjustment disorder with mixed anxiety and depressed mood   [place] [facility] [name], NP   8 months ago Type 2 diabetes mellitus without complication, without long-term current use of insulin [facility])   [place], [place], NP       Future Appointments             In 10 months Custovic, Collene Mares, [facility]            Refused Prescriptions Disp Refills   DULoxetine (CYMBALTA) 30 MG capsule 180 capsule 1    Sig: TAKE ONE CAPSULE BY  MOUTH TWICE DAILY @ 9AM & 5PM     Psychiatry: Antidepressants - SNRI - duloxetine Passed - 07/17/2022  4:35 PM       Passed - Cr in normal range and within 360 days    Creat  Date Value Ref Range Status  02/24/2013 0.72 0.50 - 1.10 mg/dL Final   Creatinine, Ser  Date Value Ref Range Status  06/19/2022 0.94 0.44 - 1.00 mg/dL Final         Passed - eGFR is 30 or above and within 360 days    GFR calc Af Amer  Date Value Ref Range Status  05/27/2020 109 >59 mL/min/1.73 Final    Comment:    **In accordance with recommendations from the NKF-ASN Task force,**   [facility] is in the process of updating its eGFR calculation to the   2021 CKD-EPI creatinine equation that estimates kidney function   without a race variable.    GFR, Estimated  Date Value Ref Range Status  06/19/2022 >60 >60 mL/min Final    Comment:    (NOTE) Calculated using the CKD-EPI Creatinine Equation (2021)    eGFR  Date Value Ref Range Status  04/06/2022 89 >59 mL/min/1.73 Final         Passed - Completed PHQ-2 or PHQ-9 in the last 360 days      Passed - Last BP in normal range    BP Readings from Last 1 Encounters:  06/22/22 136/85         Passed - Valid encounter within last 6 months    Recent Outpatient Visits           1 month ago Type 2 diabetes mellitus without complication, without long-term current use of insulin ([place])   [place] [facility] [name], NP   3 months ago Diabetes mellitus type 2 in nonobese [facility])   [place], Michelle P, NP   5 months ago Type 2 diabetes mellitus without complication, without long-term current use of insulin ([place])   [place], Michelle P, NP   6 months ago Adjustment disorder with mixed anxiety and depressed mood   [place] [facility] [name], NP   8 months ago Type 2 diabetes mellitus without complication, without long-term current use of insulin [facility])   [place], [place], NP       Future  Appointments             In 10 months Custovic, Collene Mares, [place] [facility]

## 2022-07-17 NOTE — Telephone Encounter (Signed)
Medication Refill - Medication: sitaGLIPtin (JANUVIA) 25 MG tablet , DULoxetine (CYMBALTA) 30 MG capsule , losartan-hydrochlorothiazide (HYZAAR) 100-25 MG tablet , amLODipine (NORVASC) 10 MG tablet   Pam from [facility] requesting refills. Stated a fax was sent to the office on 06/02/2022 requesting a transfer all active scrips. However, they have not heard back.  Please advise.   Has the patient contacted their pharmacy? Yes.    (Agent: If yes, when and what did the pharmacy advise?)  Preferred Pharmacy (with phone number or street name):  [facility] - [place], [place] - [address] [address]  [address] [place] [address]  Phone: [phone] Fax: [phone]  Hours: Not open 24 hours   Has the patient been seen for an appointment in the last year OR does the patient have an upcoming appointment? Yes.    Agent: Please be advised that RX refills may take up to 3 business days. We ask that you follow-up with your pharmacy.

## 2022-07-18 NOTE — Progress Notes (Signed)
KAYLEA, MOUNSEY D ([id number]) [id number] Page 1 of 7 Visit Report for 07/13/2022 Arrival Information Details Patient Name: Date of Service: [name], [name] 07/13/2022 11:00 A M Medical Record Number: [id number] Patient Account Number: [id number] Date of Birth/Sex: Treating RN: 05-23-1956 (67 y.o. F) Primary Care Ryer Asato: Juluis Mire Other Clinician: Referring [place] Searles: Treating Pesach Frisch/Extender: Edmonia Lynch in Treatment: 1 Visit Information History Since Last Visit Added or deleted any medications: No Patient Arrived: Ambulatory Any new allergies or adverse reactions: No Arrival Time: 10:53 Had a fall or experienced change in No Accompanied By: self activities of daily living that may affect Transfer Assistance: None risk of falls: Patient Identification Verified: Yes Signs or symptoms of abuse/neglect since last visito No Secondary Verification Process Completed: Yes Hospitalized since last visit: No Patient Requires Transmission-Based Precautions: No Implantable device outside of the clinic excluding No Patient Has Alerts: No cellular tissue based products placed in the center since last visit: Has Dressing in Place as Prescribed: Yes Pain Present Now: Yes Electronic Signature(s) Signed: 07/13/2022 11:49:43 AM By: Erenest Blank Entered By: Erenest Blank on 07/13/2022 11:00:51 -------------------------------------------------------------------------------- Encounter Discharge Information Details Patient Name: Date of Service: Marcia Natter D. 07/13/2022 11:00 A M Medical Record Number: [id number] Patient Account Number: [id number] Date of Birth/Sex: Treating RN: 1956/05/25 (67 y.o. Tonita Phoenix, Lauren Primary Care Javione Gunawan: Juluis Mire Other Clinician: Referring Ambrosia Wisnewski: Treating Valari Taylor/Extender: Edmonia Lynch in Treatment: 1 Encounter Discharge Information Items  Post Procedure Vitals Discharge Condition: Stable Temperature (F): 98.7 Ambulatory Status: Ambulatory Pulse (bpm): 74 Discharge Destination: Home Respiratory Rate (breaths/min): 17 Transportation: Private Auto Blood Pressure (mmHg): 120/80 Accompanied By: self Schedule Follow-up Appointment: Yes Clinical Summary of Care: Patient Declined Electronic Signature(s) Signed: 07/18/2022 4:09:44 PM By: [name] RN Entered By: [name] on 07/13/2022 12:04:12 Shirline Frees D ([id number]) [id number] Page 2 of 7 -------------------------------------------------------------------------------- Lower Extremity Assessment Details Patient Name: Date of Service: [name] 07/13/2022 11:00 A M Medical Record Number: [id number] Patient Account Number: [id number] Date of Birth/Sex: Treating RN: 06/19/1956 (67 y.o. F) Primary Care Suzann Lazaro: Juluis Mire Other Clinician: Referring Woodroe Vogan: Treating Ronasia Isola/Extender: Edmonia Lynch in Treatment: 1 Electronic Signature(s) Signed: 07/13/2022 11:49:43 AM By: Erenest Blank Entered By: Erenest Blank on 07/13/2022 11:02:42 -------------------------------------------------------------------------------- Multi Wound Chart Details Patient Name: Date of Service: Marcia Natter D. 07/13/2022 11:00 A M Medical Record Number: [id number] Patient Account Number: [id number] Date of Birth/Sex: Treating RN: [birth year] (68 y.o. F) Primary Care Jalal Rauch: Juluis Mire Other Clinician: Referring Kensi Karr: Treating Mykell Genao/Extender: Edmonia Lynch in Treatment: 1 Vital Signs Height(in): Pulse(bpm): 75 Weight(lbs): Blood Pressure(mmHg): 161/97 Body Mass Index(BMI): Temperature(F): 97.6 Respiratory Rate(breaths/min): 18 [1:Photos:] [N/A:N/A] Left Back N/A N/A Wound Location: Bump N/A N/A Wounding Event: Abscess N/A N/A Primary  Etiology: Anemia, Hypertension, Type II N/A N/A Comorbid History: Diabetes, Osteoarthritis 05/25/2022 N/A N/A Date Acquired: 1 N/A N/A Weeks of Treatment: Open N/A N/A Wound Status: No N/A N/A Wound Recurrence: 1.5x1x0.1 N/A N/A Measurements L x W x D (cm) 1.178 N/A N/A A (cm) : rea 0.118 N/A N/A Volume (cm) : 62.50% N/A N/A % Reduction in Area: 87.50% N/A N/A % Reduction in Volume: Full Thickness With Exposed Support N/A N/A Classification: Structures Medium N/A N/A Exudate Amount: Serosanguineous N/A N/A Exudate Type: red, brown N/A N/A Exudate Color: Distinct, outline attached N/A N/A Wound Margin: Large (67-100%) N/A N/A Granulation Amount: Red, Pink N/A N/A Granulation QualityCESILY, [name] ([id number]) [id number] Page 3  of 7 Small (1-33%) N/A N/A Necrotic Amount: Eschar, Adherent Slough N/A N/A Necrotic Tissue: Fat Layer (Subcutaneous Tissue): Yes N/A N/A Exposed Structures: Fascia: No Tendon: No Muscle: No Joint: No Bone: No Small (1-33%) N/A N/A Epithelialization: Debridement - Excisional N/A N/A Debridement: Pre-procedure Verification/Time Out 11:42 N/A N/A Taken: Lidocaine N/A N/A Pain Control: Subcutaneous, Slough N/A N/A Tissue Debrided: Skin/Subcutaneous Tissue N/A N/A Level: 1.5 N/A N/A Debridement A (sq cm): rea Curette N/A N/A Instrument: Minimum N/A N/A Bleeding: Pressure N/A N/A Hemostasis A chieved: 0 N/A N/A Procedural Pain: 0 N/A N/A Post Procedural Pain: Procedure was tolerated well N/A N/A Debridement Treatment Response: 1.5x1x0.1 N/A N/A Post Debridement Measurements L x W x D (cm) 0.118 N/A N/A Post Debridement Volume: (cm) Excoriation: No N/A N/A Periwound Skin Texture: Induration: No Callus: No Crepitus: No Rash: No Scarring: No Maceration: No N/A N/A Periwound Skin Moisture: Dry/Scaly: No Atrophie Blanche: No N/A N/A Periwound Skin Color: Cyanosis:  No Ecchymosis: No Erythema: No Hemosiderin Staining: No Mottled: No Pallor: No Rubor: No No Abnormality N/A N/A Temperature: Yes N/A N/A Tenderness on Palpation: Debridement N/A N/A Procedures Performed: Treatment Notes Wound #1 (Back) Wound Laterality: Left Cleanser Soap and Water Discharge Instruction: May shower and wash wound with dial antibacterial soap and water prior to dressing change. Wound Cleanser Discharge Instruction: Cleanse the wound with wound cleanser prior to applying a clean dressing using gauze sponges, not tissue or cotton balls. Peri-Wound Care Topical Primary Dressing Hydrofera Blue Ready Transfer Foam, 2.5x2.5 (in/in) Discharge Instruction: Apply directly to wound bed as directed MediHoney Gel, tube 1.5 (oz) Discharge Instruction: Apply to wound bed as instructed Secondary Dressing Woven Gauze Sponge, Non-Sterile 4x4 in Discharge Instruction: Apply over primary dressing as directed. Zetuvit Plus Silicone Border Dressing 4x4 (in/in) Discharge Instruction: Apply silicone border over primary dressing as directed. Secured With Compression Wrap Compression Stockings Add-Ons Electronic Signature(s) [name], [name] ([id number]) [id number] Page 4 of 7 Signed: 07/13/2022 11:59:53 AM By: Kalman Shan DO Entered By: Kalman Shan on 07/13/2022 11:54:42 -------------------------------------------------------------------------------- Multi-Disciplinary Care Plan Details Patient Name: Date of Service: Marcia Natter D. 07/13/2022 11:00 A M Medical Record Number: [id number] Patient Account Number: [id number] Date of Birth/Sex: Treating RN: 1956-06-10 (67 y.o. Tonita Phoenix, Lauren Primary Care Julee Stoll: Juluis Mire Other Clinician: Referring Ellissa Ayo: Treating Nyomi Howser/Extender: Edmonia Lynch in Treatment: 1 Active Inactive Orientation to the Wound Care Program Nursing  Diagnoses: Knowledge deficit related to the wound healing center program Goals: Patient/caregiver will verbalize understanding of the [facility] Program Date Initiated: 07/06/2022 Target Resolution Date: 08/03/2022 Goal Status: Active Interventions: Provide education on orientation to the wound center Notes: Wound/Skin Impairment Nursing Diagnoses: Impaired tissue integrity Knowledge deficit related to ulceration/compromised skin integrity Goals: Patient will have a decrease in wound volume by X% from date: (specify in notes) Date Initiated: 07/06/2022 Target Resolution Date: 08/02/2022 Goal Status: Active Patient/caregiver will verbalize understanding of skin care regimen Date Initiated: 07/06/2022 Target Resolution Date: 08/03/2022 Goal Status: Active Ulcer/skin breakdown will have a volume reduction of 30% by week 4 Date Initiated: 07/06/2022 Target Resolution Date: 08/02/2022 Goal Status: Active Interventions: Assess patient/caregiver ability to obtain necessary supplies Assess patient/caregiver ability to perform ulcer/skin care regimen upon admission and as needed Assess ulceration(s) every visit Notes: Electronic Signature(s) Signed: 07/18/2022 4:09:44 PM By: [name] RN Entered By: [name] on 07/13/2022 Mullens, Meily D ([id number]) [id number] Page 5 of 7 -------------------------------------------------------------------------------- Pain Assessment Details Patient Name: Date of Service: [name], MELLO.  07/13/2022 11:00 A M Medical Record Number: [id number] Patient Account Number: [id number] Date of Birth/Sex: Treating RN: February 06, 1956 (67 y.o. F) Primary Care Dewey Neukam: Juluis Mire Other Clinician: Referring Herald Vallin: Treating Bradan Congrove/Extender: Edmonia Lynch in Treatment: 1 Active Problems Location of Pain Severity and Description of Pain Patient Has Paino Yes Site  Locations Pain Location: Pain in Ulcers Rate the pain. Current Pain Level: 6 Pain Management and Medication Current Pain Management: Electronic Signature(s) Signed: 07/13/2022 11:49:43 AM By: Erenest Blank Entered By: Erenest Blank on 07/13/2022 11:02:30 -------------------------------------------------------------------------------- Patient/Caregiver Education Details Patient Name: Date of Service: [name], Marcia D. 1/19/2024andnbsp11:00 [facility] Record Number: [id number] Patient Account Number: [id number] Date of Birth/Gender: Treating RN: May 18, 1956 (67 y.o. Tonita Phoenix, Lauren Primary Care Physician: Juluis Mire Other Clinician: Referring Physician: Treating Physician/Extender: Edmonia Lynch in Treatment: 1 Education Assessment Education Provided To: Patient Education Topics Provided Wound/Skin Impairment: Methods: Explain/Verbal Responses: Reinforcements needed, State content correctly Electronic Signature(s) Signed: 07/18/2022 4:09:44 PM By: [name] RN Entered By: [name] on 07/13/2022 11:41:24 Shirline Frees D ([id number]) [id number] Page 6 of 7 -------------------------------------------------------------------------------- Wound Assessment Details Patient Name: Date of Service: [name], [name] 07/13/2022 11:00 A M Medical Record Number: [id number] Patient Account Number: [id number] Date of Birth/Sex: Treating RN: Nov 08, 1955 (67 y.o. F) Primary Care Tyasia Packard: Juluis Mire Other Clinician: Referring Daniyah Fohl: Treating Jaelee Laughter/Extender: Geryl Councilman Weeks in Treatment: 1 Wound Status Wound Number: 1 Primary Etiology: Abscess Wound Location: Left Back Wound Status: Open Wounding Event: Bump Comorbid History: Anemia, Hypertension, Type II Diabetes, Osteoarthritis Date Acquired: 05/25/2022 Weeks Of Treatment: 1 Clustered Wound: No Photos Wound  Measurements Length: (cm) 1.5 Width: (cm) 1 Depth: (cm) 0.1 Area: (cm) 1.178 Volume: (cm) 0.118 % Reduction in Area: 62.5% % Reduction in Volume: 87.5% Epithelialization: Small (1-33%) Tunneling: No Undermining: No Wound Description Classification: Full Thickness With Exposed Suppo Wound Margin: Distinct, outline attached Exudate Amount: Medium Exudate Type: Serosanguineous Exudate Color: red, brown rt Structures Foul Odor After Cleansing: No Slough/Fibrino Yes Wound Bed Granulation Amount: Large (67-100%) Exposed Structure Granulation Quality: Red, Pink Fascia Exposed: No Necrotic Amount: Small (1-33%) Fat Layer (Subcutaneous Tissue) Exposed: Yes Necrotic Quality: Eschar, Adherent Slough Tendon Exposed: No Muscle Exposed: No Joint Exposed: No Bone Exposed: No Periwound Skin Texture Texture Color No Abnormalities Noted: No No Abnormalities Noted: No Callus: No Atrophie Blanche: No Crepitus: No Cyanosis: No Excoriation: No Ecchymosis: No Induration: No Erythema: No Rash: No Hemosiderin Staining: No Scarring: No Mottled: No Pallor: No Moisture Rubor: No No Abnormalities Noted: No Dry / Scaly: No Temperature / Pain [name], [name] ([id number]) [id number] Page 7 of 7 Maceration: No Temperature: No Abnormality Tenderness on Palpation: Yes Treatment Notes Wound #1 (Back) Wound Laterality: Left Cleanser Soap and Water Discharge Instruction: May shower and wash wound with dial antibacterial soap and water prior to dressing change. Wound Cleanser Discharge Instruction: Cleanse the wound with wound cleanser prior to applying a clean dressing using gauze sponges, not tissue or cotton balls. Peri-Wound Care Topical Primary Dressing Hydrofera Blue Ready Transfer Foam, 2.5x2.5 (in/in) Discharge Instruction: Apply directly to wound bed as directed MediHoney Gel, tube 1.5 (oz) Discharge Instruction: Apply to wound bed as  instructed Secondary Dressing Woven Gauze Sponge, Non-Sterile 4x4 in Discharge Instruction: Apply over primary dressing as directed. Zetuvit Plus Silicone Border Dressing 4x4 (in/in) Discharge Instruction: Apply silicone border over primary dressing as directed. Secured With Compression Wrap Compression Stockings Environmental education officer) Signed: 07/13/2022 11:49:43 AM By: Erenest Blank  Entered By: Erenest Blank on 07/13/2022 11:09:42 -------------------------------------------------------------------------------- Vitals Details Patient Name: Date of Service: [name], [name] 07/13/2022 11:00 A M Medical Record Number: [id number] Patient Account Number: [id number] Date of Birth/Sex: Treating RN: 02/04/1956 (67 y.o. F) Primary Care Hersh Minney: Juluis Mire Other Clinician: Referring Rocio Roam: Treating Sharlie Shreffler/Extender: Edmonia Lynch in Treatment: 1 Vital Signs Time Taken: 11:00 Temperature (F): 97.6 Pulse (bpm): 75 Respiratory Rate (breaths/min): 18 Blood Pressure (mmHg): 161/97 Reference Range: 80 - 120 mg / dl Electronic Signature(s) Signed: 07/13/2022 11:49:43 AM By: Erenest Blank Entered By: Erenest Blank on 07/13/2022 11:02:17

## 2022-07-18 NOTE — Progress Notes (Signed)
GAZELLE, TOWE D ([id number]) [id number] Page 1 of 7 Visit Report for 07/13/2022 Chief Complaint Document Details Patient Name: Date of Service: [name], [name] 07/13/2022 11:00 A M Medical Record Number: [id number] Patient Account Number: [id number] Date of Birth/Sex: Treating RN: [birth year] (67 y.o. F) Primary Care Provider: Juluis Mire Other Clinician: Referring Provider: Treating Provider/Extender: Edmonia Lynch in Treatment: 1 Information Obtained from: Patient Chief Complaint 07/06/2022; left-sided back wound Electronic Signature(s) Signed: 07/13/2022 11:59:53 AM By: Kalman Shan DO Entered By: Kalman Shan on 07/13/2022 11:54:51 -------------------------------------------------------------------------------- Debridement Details Patient Name: Date of Service: Williemae Natter D. 07/13/2022 11:00 A M Medical Record Number: [id number] Patient Account Number: [id number] Date of Birth/Sex: Treating RN: 11/11/1955 (67 y.o. Tonita Phoenix, Lauren Primary Care Provider: Juluis Mire Other Clinician: Referring Provider: Treating Provider/Extender: Edmonia Lynch in Treatment: 1 Debridement Performed for Assessment: Wound #1 Left Back Performed By: Physician Kalman Shan, DO Debridement Type: Debridement Level of Consciousness (Pre-procedure): Awake and Alert Pre-procedure Verification/Time Out Yes - 11:42 Taken: Start Time: 11:42 Pain Control: Lidocaine T Area Debrided (L x W): otal 1.5 (cm) x 1 (cm) = 1.5 (cm) Tissue and other material debrided: Viable, Non-Viable, Slough, Subcutaneous, Slough Level: Skin/Subcutaneous Tissue Debridement Description: Excisional Instrument: Curette Bleeding: Minimum Hemostasis Achieved: Pressure End Time: 11:42 Procedural Pain: 0 Post Procedural Pain: 0 Response to Treatment: Procedure was tolerated well Level of Consciousness (Post-  Awake and Alert procedure): Post Debridement Measurements of Total Wound Length: (cm) 1.5 Width: (cm) 1 Depth: (cm) 0.1 Volume: (cm) 0.118 Character of Wound/Ulcer Post Debridement: Improved Post Procedure Diagnosis MARQUISE, LAMBSON D ([id number]) [id number] Page 2 of 7 Same as Pre-procedure Electronic Signature(s) Signed: 07/13/2022 11:59:53 AM By: Kalman Shan DO Signed: 07/18/2022 4:09:44 PM By: [name] RN Entered By: [name] on 07/13/2022 11:42:42 -------------------------------------------------------------------------------- HPI Details Patient Name: Date of Service: Williemae Natter D. 07/13/2022 11:00 A M Medical Record Number: [id number] Patient Account Number: [id number] Date of Birth/Sex: Treating RN: [birth year] (67 y.o. F) Primary Care Provider: Juluis Mire Other Clinician: Referring Provider: Treating Provider/Extender: Edmonia Lynch in Treatment: 1 History of Present Illness HPI Description: 07/06/2022 Ms. Gabbie Rodrigues is a 67 year old female with a past medical history of controlled type 2 diabetes on oral agents and essential hypertension that presents to clinic for a 1-2 month history of nonhealing wound to her upper left back. She has visited the ED and urgent care on several occasions for this issue. The area had started out as an abscess and this was IandD need on 06/20/2022 in urgent care. This cultured MRSA and patient was given doxycycline. She states she completed this course 1 week ago. Currently she has been keeping the area covered. She currently denies systemic signs of infection. 1/19; patient presents for follow-up. She has been using Medihoney and Hydrofera Blue to the wound bed. There is been improvement in wound healing. She has no issues or complaints today. Electronic Signature(s) Signed: 07/13/2022 11:59:53 AM By: Kalman Shan DO Entered By: Kalman Shan  on 07/13/2022 11:55:16 -------------------------------------------------------------------------------- Physical Exam Details Patient Name: Date of Service: GLENDA, SPELMAN D. 07/13/2022 11:00 A M Medical Record Number: [id number] Patient Account Number: [id number] Date of Birth/Sex: Treating RN: 1956/05/04 (67 y.o. F) Primary Care Provider: Juluis Mire Other Clinician: Referring Provider: Treating Provider/Extender: Edmonia Lynch in Treatment: 1 Constitutional respirations regular, non-labored and within target range for patient.. Cardiovascular 2+ dorsalis pedis/posterior tibialis pulses. Psychiatric pleasant and cooperative. Notes Left upper  back side with an open wound with granulation tissue and nonviable tissue. No signs of surrounding soft tissue infection including increased warmth, erythema or purulent drainage Electronic Signature(s) Signed: 07/13/2022 11:59:53 AM By: Kalman Shan DO Entered By: Kalman Shan on 07/13/2022 11:55:36 Shirline Frees D ([id number]) [id number] Page 3 of 7 -------------------------------------------------------------------------------- Physician Orders Details Patient Name: Date of Service: LAKRISHA, ISEMAN 07/13/2022 11:00 A M Medical Record Number: [id number] Patient Account Number: [id number] Date of Birth/Sex: Treating RN: Dec 05, 1955 (67 y.o. Tonita Phoenix, Lauren Primary Care Provider: Juluis Mire Other Clinician: Referring Provider: Treating Provider/Extender: Edmonia Lynch in Treatment: 1 Verbal / Phone Orders: No Diagnosis Coding Follow-up Appointments ppointment in 1 week. - w/ Dr. Heber [place] next Friday 07/19/22 @ 0930 Rm # 8 w/ Tammi Klippel (make wound encounter slot) Return A ppointment in 2 weeks. - w/ Dr. Heber [place] (Marcia ahead and make appt.) Return A Anesthetic (In clinic) Topical Lidocaine 5% applied to wound bed Bathing/ Shower/  Hygiene May shower with protection but do not get wound dressing(s) wet. Protect dressing(s) with water repellant cover (for example, large plastic bag) or a cast cover and may then take shower. - YOu may shower without the bandage on as well. YOu can use dial antibacterial soap to clean the wound in the shower. BE sure to dress immediately after getting our of the shower. Wound Treatment Wound #1 - Back Wound Laterality: Left Cleanser: Soap and Water 1 x Per MBW/46 Days Discharge Instructions: May shower and wash wound with dial antibacterial soap and water prior to dressing change. Cleanser: Wound Cleanser (Generic) 1 x Per Day/15 Days Discharge Instructions: Cleanse the wound with wound cleanser prior to applying a clean dressing using gauze sponges, not tissue or cotton balls. Prim Dressing: Hydrofera Blue Ready Transfer Foam, 2.5x2.5 (in/in) (Generic) 1 x Per Day/15 Days ary Discharge Instructions: Apply directly to wound bed as directed Prim Dressing: MediHoney Gel, tube 1.5 (oz) 1 x Per Day/15 Days ary Discharge Instructions: Apply to wound bed as instructed Secondary Dressing: Woven Gauze Sponge, Non-Sterile 4x4 in (Generic) 1 x Per Day/15 Days Discharge Instructions: Apply over primary dressing as directed. Secondary Dressing: Zetuvit Plus Silicone Border Dressing 4x4 (in/in) (Generic) 1 x Per Day/15 Days Discharge Instructions: Apply silicone border over primary dressing as directed. Electronic Signature(s) Signed: 07/13/2022 11:59:53 AM By: Kalman Shan DO Entered By: Kalman Shan on 07/13/2022 11:55:42 -------------------------------------------------------------------------------- Problem List Details Patient Name: Date of Service: Williemae Natter D. 07/13/2022 11:00 A M Medical Record Number: [id number] Patient Account Number: [id number] Date of Birth/Sex: Treating RN: May 15, 1956 (68 y.o. 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 07/13/2022 11:00 A M Medical Record Number: [id number] Patient Account Number: [id number] Date of Birth/Sex: Treating RN: 1955-10-24 (67 y.o. F) Primary Care Provider: Juluis Mire Other Clinician: Referring Provider: Treating Provider/Extender: Edmonia Lynch in Treatment: 1 Subjective Chief Complaint Information obtained from  Patient 07/06/2022; left-sided back wound History of Present Illness (HPI) 07/06/2022 Ms. Kathline Caetano is a 67 year old female with a past medical history of controlled type 2 diabetes on oral agents and essential hypertension that presents to clinic for a 1-2 month history of nonhealing wound to her upper left back. She has visited the ED and urgent care on several occasions for this issue. The area had started out as an abscess and this was IandD need on 06/20/2022 in urgent care. This cultured MRSA and patient was given doxycycline. She states she completed this course 1 week ago. Currently she has been keeping the area covered. She currently denies systemic signs of infection. 1/19; patient presents for follow-up. She has  been using Medihoney and Hydrofera Blue to the wound bed. There is been improvement in wound healing. She has no issues or complaints today. Patient History Information obtained from Patient, Chart. Family History Unknown History. Social History Former smoker, Marital Status - Widowed, Alcohol Use - Never, Drug Use - No History, Caffeine Use - Rarely. 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 Vitals Time Taken: 11:00 AM, Temperature: 97.6 F, Pulse: 75 bpm, Respiratory Rate: 18 breaths/min, Blood Pressure: 161/97 mmHg. Cardiovascular 2+ dorsalis pedis/posterior tibialis pulses. Psychiatric pleasant and cooperative. General Notes: Left upper back side with an open wound with granulation tissue and nonviable tissue. No signs of surrounding soft tissue infection including increased warmth, erythema or purulent drainage Integumentary (Hair, Skin) Wound #1 status is Open. Original cause of wound was Bump. The date acquired was: 05/25/2022. The wound has been in treatment 1 weeks. The wound is located on the Left Back. The wound measures 1.5cm length x 1cm width x 0.1cm depth; 1.178cm^2 area and 0.118cm^3 volume. There is Fat Layer (Subcutaneous Tissue) exposed. There is no tunneling or undermining noted. There is a medium amount of serosanguineous drainage noted. The wound margin is distinct with the outline attached to the wound base. There is large (67-100%) red, pink granulation within the  wound bed. There is a small (1-33%) amount of necrotic tissue within the wound bed including Eschar and Adherent Slough. The periwound skin appearance did not exhibit: Callus, Crepitus, Excoriation, Induration, Rash, Scarring, Dry/Scaly, Maceration, Atrophie Blanche, Cyanosis, Ecchymosis, Hemosiderin Staining, Mottled, Pallor, Rubor, Erythema. Periwound temperature was noted as No Abnormality. The periwound has tenderness on palpation. 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 I debrided nonviable tissue. I recommended continuing the course with Medihoney and Hydrofera Blue. Follow-up in 1 week. Procedures Wound #1 Pre-procedure diagnosis of Wound #1 is an Abscess located on the Left Back . There was a Excisional Skin/Subcutaneous Tissue Debridement with a total area of 1.5 sq cm performed by Kalman Shan, DO. With the following instrument(s): Curette to remove Viable and Non-Viable tissue/material. Material removed includes Subcutaneous Tissue and Slough and after achieving pain control using Lidocaine. No specimens were taken. A time out was conducted at 11:42, prior to the start of  the procedure. A Minimum amount of bleeding was controlled with Pressure. The procedure was tolerated well with a pain level of 0 throughout and a pain level of 0 following the procedure. Post Debridement Measurements: 1.5cm length x 1cm width x 0.1cm depth; 0.118cm^3 volume. Character of Wound/Ulcer Post Debridement is improved. Post procedure Diagnosis Wound #1: Same as Pre-Procedure Plan Follow-up Appointments: Return Appointment in 1 week. - w/ Dr. Heber [place] next Friday 07/19/22 @ 0930 Rm # 8 w/ Tammi Klippel (make wound encounter slot) Return Appointment in 2 weeks. - w/ Dr.  Heber [place] (Marcia ahead and make appt.) Anesthetic: (In clinic) Topical Lidocaine 5% applied to wound bed Bathing/ Shower/ Hygiene: May shower with protection but do not get wound dressing(s) wet. Protect dressing(s) with water repellant cover (for example, large plastic bag) or a cast cover TOMICA, ARSENEAULT ([id number]) [id number] Page 6 of 7 and may then take shower. - YOu may shower without the bandage on as well. YOu can use dial antibacterial soap to clean the wound in the shower. BE sure to dress immediately after getting our of the shower. WOUND #1: - Back Wound Laterality: Left Cleanser: Soap and Water 1 x Per MHD/62 Days Discharge Instructions: May shower and wash wound with dial antibacterial soap and water prior to dressing change. Cleanser: Wound Cleanser (Generic) 1 x Per Day/15 Days Discharge Instructions: Cleanse the wound with wound cleanser prior to applying a clean dressing using gauze sponges, not tissue or cotton balls. Prim Dressing: Hydrofera Blue Ready Transfer Foam, 2.5x2.5 (in/in) (Generic) 1 x Per Day/15 Days ary Discharge Instructions: Apply directly to wound bed as directed Prim Dressing: MediHoney Gel, tube 1.5 (oz) 1 x Per Day/15 Days ary Discharge Instructions: Apply to wound bed as instructed Secondary Dressing: Woven Gauze Sponge, Non-Sterile 4x4 in (Generic) 1 x Per Day/15 Days Discharge Instructions: Apply over primary dressing as directed. Secondary Dressing: Zetuvit Plus Silicone Border Dressing 4x4 (in/in) (Generic) 1 x Per Day/15 Days Discharge Instructions: Apply silicone border over primary dressing as directed. 1. In office sharp debridement 2. Medihoney and Hydrofera Blue 3. Follow-up in 1 week Electronic Signature(s) Signed: 07/13/2022 11:59:53 AM By: Kalman Shan DO Entered By: Kalman Shan on 07/13/2022 11:56:11 -------------------------------------------------------------------------------- HxROS  Details Patient Name: Date of Service: Williemae Natter D. 07/13/2022 11:00 A M Medical Record Number: [id number] Patient Account Number: [id number] Date of Birth/Sex: Treating RN: [birth year] (67 y.o. 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; aneurysm, small, on L side of brain Immunizations Pneumococcal Vaccine: Received Pneumococcal VaccinationJACQUELI, PANGALLO ([id number]) [id number] Page 7 of 7 Received Pneumococcal Vaccination On or After 60th Birthday: Yes Implantable Devices None Family and Social History Unknown History: Yes; Former smoker; Marital Status - Widowed; Alcohol Use: Never; Drug Use: No History; Caffeine Use: Rarely; Financial Concerns: No; Food, Clothing or Shelter Needs: No; Support System Lacking: No; Transportation Concerns: No Electronic Signature(s) Signed: 07/13/2022 11:59:53 AM By: Kalman Shan DO Entered By: Kalman Shan on 07/13/2022 11:55:20 -------------------------------------------------------------------------------- SuperBill Details Patient Name: Date of Service: Williemae Natter D. 07/13/2022 Medical Record Number: [id number] Patient Account Number: [id number] Date of Birth/Sex: Treating RN: Jan 05, 1956 (67 y.o. 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

## 2022-07-19 ENCOUNTER — Ambulatory Visit (HOSPITAL_BASED_OUTPATIENT_CLINIC_OR_DEPARTMENT_OTHER): Payer: Medicare HMO | Admitting: Internal Medicine

## 2022-07-23 ENCOUNTER — Ambulatory Visit (HOSPITAL_BASED_OUTPATIENT_CLINIC_OR_DEPARTMENT_OTHER): Payer: Medicare HMO | Admitting: Internal Medicine

## 2022-07-23 ENCOUNTER — Other Ambulatory Visit: Payer: Self-pay | Admitting: Gastroenterology

## 2022-07-24 ENCOUNTER — Encounter (HOSPITAL_COMMUNITY): Payer: Self-pay | Admitting: Emergency Medicine

## 2022-07-24 ENCOUNTER — Emergency Department (HOSPITAL_COMMUNITY)
Admission: EM | Admit: 2022-07-24 | Discharge: 2022-07-24 | Disposition: A | Discharge status: Home / Self Care | Payer: Medicare HMO | Attending: Emergency Medicine | Admitting: Emergency Medicine

## 2022-07-24 ENCOUNTER — Encounter (HOSPITAL_BASED_OUTPATIENT_CLINIC_OR_DEPARTMENT_OTHER): Payer: Medicare HMO | Admitting: Internal Medicine

## 2022-07-24 DIAGNOSIS — X501XXA Overexertion from prolonged static or awkward postures, initial encounter: Secondary | ICD-10-CM | POA: Diagnosis not present

## 2022-07-24 DIAGNOSIS — L02212 Cutaneous abscess of back [any part, except buttock]: Secondary | ICD-10-CM

## 2022-07-24 DIAGNOSIS — I10 Essential (primary) hypertension: Secondary | ICD-10-CM | POA: Diagnosis not present

## 2022-07-24 DIAGNOSIS — L98428 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: Secondary | ICD-10-CM

## 2022-07-24 DIAGNOSIS — E11622 Type 2 diabetes mellitus with other skin ulcer: Secondary | ICD-10-CM

## 2022-07-24 DIAGNOSIS — Z87891 Personal history of nicotine dependence: Secondary | ICD-10-CM | POA: Diagnosis not present

## 2022-07-24 DIAGNOSIS — M79601 Pain in right arm: Secondary | ICD-10-CM | POA: Diagnosis present

## 2022-07-24 DIAGNOSIS — Z79899 Other long term (current) drug therapy: Secondary | ICD-10-CM | POA: Diagnosis not present

## 2022-07-24 DIAGNOSIS — Z7984 Long term (current) use of oral hypoglycemic drugs: Secondary | ICD-10-CM | POA: Diagnosis not present

## 2022-07-24 NOTE — Discharge Instructions (Signed)
As discussed, you likely sustained a sprain or strain of the muscles and tendons in your arm.  Please use Advil, 400 mg, taken 3 times daily with food for the next 3 days and the patch as pictured below for pain control.  Return here for concerning changes in your condition.
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
## 2022-07-25 NOTE — Progress Notes (Signed)
[name], [name] ([id number]) [id number] Page 1 of 8 Visit Report for 07/24/2022 Arrival Information Details Patient Name: Date of Service: Marcia, Hale 07/24/2022 10:15 A M Medical Record Number: [id number] Patient Account Number: [id number] Date of Birth/Sex: Treating RN: [birth year] (67 y.o. Tonita Phoenix, Lauren Primary Care Paden Kuras: Juluis Mire Other Clinician: Referring Pamala Hayman: Treating Claudie Brickhouse/Extender: Edmonia Lynch in Treatment: 2 Visit Information History Since Last Visit Added or deleted any medications: No Patient Arrived: Ambulatory Any new allergies or adverse reactions: No Arrival Time: 10:29 Had a fall or experienced change in No Accompanied By: self activities of daily living that may affect Transfer Assistance: None risk of falls: Patient Identification Verified: Yes Signs or symptoms of abuse/neglect since last visito No Secondary Verification Process Completed: Yes Hospitalized since last visit: No Patient Requires Transmission-Based Precautions: No Implantable device outside of the clinic excluding No Patient Has Alerts: No cellular tissue based products placed in the center since last visit: Has Dressing in Place as Prescribed: Yes Pain Present Now: Yes Electronic Signature(s) Signed: 07/24/2022 3:52:19 PM By: Erenest Blank Entered By: Erenest Blank on 07/24/2022 10:42:43 -------------------------------------------------------------------------------- Clinic Level of Care Assessment Details Patient Name: Date of Service: Marcia, Hale 07/24/2022 10:15 A M Medical Record Number: [id number] Patient Account Number: [id number] Date of Birth/Sex: Treating RN: 01/21/1956 (67 y.o. Tonita Phoenix, Lauren Primary Care Kesa Birky: Juluis Mire Other Clinician: Referring Elia Nunley: Treating Mekhai Venuto/Extender: Edmonia Lynch in Treatment: 2 Clinic Level of Care  Assessment Items TOOL 4 Quantity Score X- 1 0 Use when only an EandM is performed on FOLLOW-UP visit ASSESSMENTS - Nursing Assessment / Reassessment X- 1 10 Reassessment of Co-morbidities (includes updates in patient status) X- 1 5 Reassessment of Adherence to Treatment Plan ASSESSMENTS - Wound and Skin A ssessment / Reassessment X - Simple Wound Assessment / Reassessment - one wound 1 5 '[]'$  - 0 Complex Wound Assessment / Reassessment - multiple wounds '[]'$  - 0 Dermatologic / Skin Assessment (not related to wound area) ASSESSMENTS - Focused Assessment '[]'$  - 0 Circumferential Edema Measurements - multi extremities '[]'$  - 0 Nutritional Assessment / Counseling / Intervention [name], [name] ([id number]) [phone].pdf Page 2 of 8 '[]'$  - 0 Lower Extremity Assessment (monofilament, tuning fork, pulses) '[]'$  - 0 Peripheral Arterial Disease Assessment (using hand held doppler) ASSESSMENTS - Ostomy and/or Continence Assessment and Care '[]'$  - 0 Incontinence Assessment and Management '[]'$  - 0 Ostomy Care Assessment and Management (repouching, etc.) PROCESS - Coordination of Care X - Simple Patient / Family Education for ongoing care 1 15 '[]'$  - 0 Complex (extensive) Patient / Family Education for ongoing care X- 1 10 Staff obtains Programmer, systems, Records, T Results / Process Orders est '[]'$  - 0 Staff telephones HHA, Nursing Homes / Clarify orders / etc '[]'$  - 0 Routine Transfer to another Facility (non-emergent condition) '[]'$  - 0 Routine Hospital Admission (non-emergent condition) '[]'$  - 0 New Admissions / Biomedical engineer / Ordering NPWT Apligraf, etc. , '[]'$  - 0 Emergency Hospital Admission (emergent condition) X- 1 10 Simple Discharge Coordination '[]'$  - 0 Complex (extensive) Discharge Coordination PROCESS - Special Needs '[]'$  - 0 Pediatric / Minor Patient Management '[]'$  - 0 Isolation Patient Management '[]'$  - 0 Hearing / Language / Visual special needs '[]'$  -  0 Assessment of Community assistance (transportation, Hale/C planning, etc.) '[]'$  - 0 Additional assistance / Altered mentation '[]'$  - 0 Support Surface(s) Assessment (bed, cushion, seat, etc.) INTERVENTIONS - Wound Cleansing / Measurement X - Simple Wound Cleansing -  one wound 1 5 '[]'$  - 0 Complex Wound Cleansing - multiple wounds X- 1 5 Wound Imaging (photographs - any number of wounds) '[]'$  - 0 Wound Tracing (instead of photographs) X- 1 5 Simple Wound Measurement - one wound '[]'$  - 0 Complex Wound Measurement - multiple wounds INTERVENTIONS - Wound Dressings '[]'$  - 0 Small Wound Dressing one or multiple wounds '[]'$  - 0 Medium Wound Dressing one or multiple wounds '[]'$  - 0 Large Wound Dressing one or multiple wounds '[]'$  - 0 Application of Medications - topical '[]'$  - 0 Application of Medications - injection INTERVENTIONS - Miscellaneous '[]'$  - 0 External ear exam '[]'$  - 0 Specimen Collection (cultures, biopsies, blood, body fluids, etc.) '[]'$  - 0 Specimen(s) / Culture(s) sent or taken to Lab for analysis '[]'$  - 0 Patient Transfer (multiple staff / Civil Service fast streamer / Similar devices) '[]'$  - 0 Simple Staple / Suture removal (25 or less) '[]'$  - 0 Complex Staple / Suture removal (26 or more) '[]'$  - 0 Hypo / Hyperglycemic Management (close monitor of Blood Glucose) [name], [name] ([id number]) [id number] Page 3 of 8 '[]'$  - 0 Ankle / Brachial Index (ABI) - do not check if billed separately X- 1 5 Vital Signs Has the patient been seen at the hospital within the last three years: Yes Total Score: 75 Level Of Care: New/Established - Level 2 Electronic Signature(s) Signed: 07/25/2022 8:39:24 AM By: [name] RN Entered By: [name] on 07/24/2022 11:38:46 -------------------------------------------------------------------------------- Encounter Discharge Information Details Patient Name: Date of Service: Marcia Natter Hale. 07/24/2022 10:15 A M Medical Record  Number: [id number] Patient Account Number: [id number] Date of Birth/Sex: Treating RN: 23-Nov-1955 (66 y.o. Tonita Phoenix, Lauren Primary Care Razia Screws: Juluis Mire Other Clinician: Referring Emalie Mcwethy: Treating Adrina Armijo/Extender: Edmonia Lynch in Treatment: 2 Encounter Discharge Information Items Discharge Condition: Stable Ambulatory Status: Ambulatory Discharge Destination: Home Transportation: Private Auto Accompanied By: self Schedule Follow-up Appointment: Yes Clinical Summary of Care: Patient Declined Electronic Signature(s) Signed: 07/25/2022 8:39:24 AM By: [name] RN Entered By: [name] on 07/24/2022 11:41:28 -------------------------------------------------------------------------------- Lower Extremity Assessment Details Patient Name: Date of Service: Marcia Natter Hale. 07/24/2022 10:15 A M Medical Record Number: [id number] Patient Account Number: [id number] Date of Birth/Sex: Treating RN: May 14, 1956 (67 y.o. Tonita Phoenix, Lauren Primary Care Indy Kuck: Juluis Mire Other Clinician: Referring Izayah Miner: Treating Jazper Nikolai/Extender: Geryl Councilman Weeks in Treatment: 2 Electronic Signature(s) Signed: 07/24/2022 3:52:19 PM By: Erenest Blank Signed: 07/25/2022 8:39:24 AM By: [name] RN Entered By: Erenest Blank on 07/24/2022 10:42:55 -------------------------------------------------------------------------------- Multi Wound Chart Details Patient Name: Date of Service: Marcia Natter Hale. 07/24/2022 10:15 A Leodis Binet Hale ([id number]) [phone].pdf Page 4 of 8 Medical Record Number: [id number] Patient Account Number: [id number] Date of Birth/Sex: Treating RN: 1955/07/22 (67 y.o. F) Primary Care Marua Qin: Juluis Mire Other Clinician: Referring Brandyn Thien: Treating Hajira Verhagen/Extender: Edmonia Lynch in Treatment: 2 Vital  Signs Height(in): Pulse(bpm): Weight(lbs): Blood Pressure(mmHg): Body Mass Index(BMI): Temperature(F): 97.8 Respiratory Rate(breaths/min): [1:Photos:] [N/A:N/A] Left Back N/A N/A Wound Location: Bump N/A N/A Wounding Event: Abscess N/A N/A Primary Etiology: Anemia, Hypertension, Type II N/A N/A Comorbid History: Diabetes, Osteoarthritis 05/25/2022 N/A N/A Date Acquired: 2 N/A N/A Weeks of Treatment: Healed - Epithelialized N/A N/A Wound Status: No N/A N/A Wound Recurrence: 0x0x0 N/A N/A Measurements L x W x Hale (cm) 0 N/A N/A A (cm) : rea 0 N/A N/A Volume (cm) : 100.00% N/A N/A % Reduction in Area: 100.00% N/A N/A % Reduction in Volume: Full  Thickness With Exposed Support N/A N/A Classification: Structures Medium N/A N/A Exudate Amount: Serosanguineous N/A N/A Exudate Type: red, brown N/A N/A Exudate Color: Distinct, outline attached N/A N/A Wound Margin: Large (67-100%) N/A N/A Granulation Amount: Pink N/A N/A Granulation Quality: Small (1-33%) N/A N/A Necrotic Amount: Fat Layer (Subcutaneous Tissue): Yes N/A N/A Exposed Structures: Fascia: No Tendon: No Muscle: No Joint: No Bone: No Large (67-100%) N/A N/A Epithelialization: Excoriation: No N/A N/A Periwound Skin Texture: Induration: No Callus: No Crepitus: No Rash: No Scarring: No Maceration: No N/A N/A Periwound Skin Moisture: Dry/Scaly: No Atrophie Blanche: No N/A N/A Periwound Skin Color: Cyanosis: No Ecchymosis: No Erythema: No Hemosiderin Staining: No Mottled: No Pallor: No Rubor: No No Abnormality N/A N/A Temperature: Yes N/A N/A Tenderness on Palpation: Treatment Notes Electronic Signature(s) Signed: 07/24/2022 11:41:42 AM By: Kalman Shan DO Entered By: Kalman Shan on 07/24/2022 10:56:32 [name] ([id number]) [id number] Page 5 of  8 -------------------------------------------------------------------------------- Multi-Disciplinary Care Plan Details Patient Name: Date of Service: [name], [name] 07/24/2022 10:15 A M Medical Record Number: [id number] Patient Account Number: [id number] Date of Birth/Sex: Treating RN: 1956/05/17 (67 y.o. Tonita Phoenix, Lauren Primary Care Leomar Westberg: Juluis Mire Other Clinician: Referring Lakin Romer: Treating Melicia Esqueda/Extender: Edmonia Lynch in Treatment: 2 Active Inactive Electronic Signature(s) Signed: 07/25/2022 8:39:24 AM By: [name] RN Entered By: [name] on 07/24/2022 10:55:34 -------------------------------------------------------------------------------- Pain Assessment Details Patient Name: Date of Service: Marcia Natter Hale. 07/24/2022 10:15 A M Medical Record Number: [id number] Patient Account Number: [id number] Date of Birth/Sex: Treating RN: [birth year] (67 y.o. Tonita Phoenix, Lauren Primary Care Ressie Slevin: Juluis Mire Other Clinician: Referring Lauralye Kinn: Treating Girard Koontz/Extender: Edmonia Lynch in Treatment: 2 Active Problems Location of Pain Severity and Description of Pain Patient Has Paino Yes Site Locations Pain Location: Generalized Pain Rate the pain. Current Pain Level: 6 Pain Management and Medication Current Pain Management: Electronic Signature(s) Signed: 07/24/2022 3:52:19 PM By: Erenest Blank Signed: 07/25/2022 8:39:24 AM By: [name] RN Entered By: Erenest Blank on 07/24/2022 10:42:51 [name] ([id number]) [phone].pdf Page 6 of 8 -------------------------------------------------------------------------------- Patient/Caregiver Education Details Patient Name: Date of Service: [name], [name] 1/30/2024andnbsp10:15 A M Medical Record Number: [id number] Patient Account Number: [id number] Date of Birth/Gender: Treating  RN: December 19, 1955 (67 y.o. Tonita Phoenix, Lauren Primary Care Physician: Juluis Mire Other Clinician: Referring Physician: Treating Physician/Extender: Edmonia Lynch in Treatment: 2 Education Assessment Education Provided To: Patient Education Topics Provided Wound/Skin Impairment: Methods: Explain/Verbal Responses: Reinforcements needed, State content correctly Electronic Signature(s) Signed: 07/25/2022 8:39:24 AM By: [name] RN Entered By: [name] on 07/24/2022 11:38:19 -------------------------------------------------------------------------------- Wound Assessment Details Patient Name: Date of Service: Marcia Natter Hale. 07/24/2022 10:15 A M Medical Record Number: [id number] Patient Account Number: [id number] Date of Birth/Sex: Treating RN: 1955/11/18 (67 y.o. Tonita Phoenix, Lauren Primary Care Karman Biswell: Juluis Mire Other Clinician: Referring Hadi Dubin: Treating Natalio Salois/Extender: Geryl Councilman Weeks in Treatment: 2 Wound Status Wound Number: 1 Primary Etiology: Abscess Wound Location: Left Back Wound Status: Healed - Epithelialized Wounding Event: Bump Comorbid History: Anemia, Hypertension, Type II Diabetes, Osteoarthritis Date Acquired: 05/25/2022 Weeks Of Treatment: 2 Clustered Wound: No Photos Wound Measurements Length: (cm) [name], [name] ([id number]) Width: (cm) Depth: (cm) Area: (cm) Volume: (cm) 0 % Reduction in Area: 100% [id number] Page 7 of 8 0 % Reduction in Volume: 100% 0 Epithelialization: Large (67-100%) 0 Tunneling: No 0 Undermining: No Wound Description Classification: Full Thickness With Exposed Suppo Wound Margin: Distinct, outline attached Exudate  Amount: Medium Exudate Type: Serosanguineous Exudate Color: red, brown rt Structures Foul Odor After Cleansing: No Slough/Fibrino Yes Wound Bed Granulation Amount: Large (67-100%)  Exposed Structure Granulation Quality: Pink Fascia Exposed: No Necrotic Amount: Small (1-33%) Fat Layer (Subcutaneous Tissue) Exposed: Yes Tendon Exposed: No Muscle Exposed: No Joint Exposed: No Bone Exposed: No Periwound Skin Texture Texture Color No Abnormalities Noted: No No Abnormalities Noted: No Callus: No Atrophie Blanche: No Crepitus: No Cyanosis: No Excoriation: No Ecchymosis: No Induration: No Erythema: No Rash: No Hemosiderin Staining: No Scarring: No Mottled: No Pallor: No Moisture Rubor: No No Abnormalities Noted: No Dry / Scaly: No Temperature / Pain Maceration: No Temperature: No Abnormality Tenderness on Palpation: Yes Treatment Notes Wound #1 (Back) Wound Laterality: Left Cleanser Peri-Wound Care Topical Primary Dressing Secondary Dressing Secured With Compression Wrap Compression Stockings Add-Ons Electronic Signature(s) Signed: 07/25/2022 8:39:24 AM By: [name] RN Entered By: [name] on 07/24/2022 10:55:03 -------------------------------------------------------------------------------- Vitals Details Patient Name: Date of Service: Marcia Natter Hale. 07/24/2022 10:15 A M Medical Record Number: [id number] Patient Account Number: [id number] Date of Birth/Sex: Treating RN: 01/01/1956 (67 y.o. Tonita Phoenix, Lauren Primary Care Renita Brocks: Juluis Mire Other Clinician: Referring Keiry Kowal: Treating Sabino Denning/Extender: Tamar, Miano Dundee Hale ([id number]) [phone].pdf Page 8 of 8 Weeks in Treatment: 2 Vital Signs Time Taken: 10:30 Temperature (F): 97.8 Reference Range: 80 - 120 mg / dl Electronic Signature(s) Signed: 07/24/2022 3:52:19 PM By: Erenest Blank Entered By: Erenest Blank on 07/24/2022 10:42:46

## 2022-07-25 NOTE — Progress Notes (Signed)
[name] Hale ([id number]) [id number] Page 1 of 6 Visit Report for 07/24/2022 Chief Complaint Document Details Patient Name: Date of Service: [name], [name] 07/24/2022 10:15 A M Medical Record Number: [id number] Patient Account Number: [id number] Date of Birth/Sex: Treating RN: 10-22-1955 (67 y.o. F) Primary Care Provider: Juluis Mire Other Clinician: Referring Provider: Treating Provider/Extender: Edmonia Lynch in Treatment: 2 Information Obtained from: Patient Chief Complaint 07/06/2022; left-sided back wound Electronic Signature(s) Signed: 07/24/2022 11:41:42 AM By: Kalman Shan DO Entered By: Kalman Shan on 07/24/2022 10:56:54 -------------------------------------------------------------------------------- HPI Details Patient Name: Date of Service: [name]. 07/24/2022 10:15 A M Medical Record Number: [id number] Patient Account Number: [id number] Date of Birth/Sex: Treating RN: February 09, 1956 (67 y.o. F) Primary Care Provider: Juluis Mire Other Clinician: Referring Provider: Treating Provider/Extender: Edmonia Lynch in Treatment: 2 History of Present Illness HPI Description: 07/06/2022 [name] is a 67 year old female with a past medical history of controlled type 2 diabetes on oral agents and essential hypertension that presents to clinic for a 1-2 month history of nonhealing wound to her upper left back. She has visited the ED and urgent care on several occasions for this issue. The area had started out as an abscess and this was IandD need on 06/20/2022 in urgent care. This cultured MRSA and patient was given doxycycline. She states she completed this course 1 week ago. Currently she has been keeping the area covered. She currently denies systemic signs of infection. 1/19; patient presents for follow-up. She has been using Medihoney and Hydrofera Blue  to the wound bed. There is been improvement in wound healing. She has no issues or complaints today. 1/30; patient presents for follow-up. Patient's been using Medihoney and Hydrofera Blue to the wound bed. The area has healed. Electronic Signature(s) Signed: 07/24/2022 11:41:42 AM By: Kalman Shan DO Entered By: Kalman Shan on 07/24/2022 10:57:20 -------------------------------------------------------------------------------- Physical Exam Details Patient Name: Date of Service: [name] 07/24/2022 10:15 A Leodis Binet Hale ([id number]) [phone].pdf Page 2 of 6 Medical Record Number: [id number] Patient Account Number: [id number] Date of Birth/Sex: Treating RN: January 29, 1956 (67 y.o. F) Primary Care Provider: Juluis Mire Other Clinician: Referring Provider: Treating Provider/Extender: Edmonia Lynch in Treatment: 2 Constitutional respirations regular, non-labored and within target range for patient.[name] Psychiatric pleasant and cooperative. Notes Left upper back side with epithelization to the previous wound site. No induration noted. No drainage. Electronic Signature(s) Signed: 07/24/2022 11:41:42 AM By: Kalman Shan DO Entered By: Kalman Shan on 07/24/2022 10:57:50 -------------------------------------------------------------------------------- Physician Orders Details Patient Name: Date of Service: [name]. 07/24/2022 10:15 A M Medical Record Number: [id number] Patient Account Number: [id number] Date of Birth/Sex: Treating RN: 1955-08-11 (67 y.o. Tonita Phoenix, Lauren Primary Care Provider: Juluis Mire Other Clinician: Referring Provider: Treating Provider/Extender: Edmonia Lynch in Treatment: 2 Verbal / Phone Orders: No Diagnosis Coding Discharge From [facility] Services Discharge from [place] Signature(s) Signed: 07/24/2022 11:41:42 AM  By: Kalman Shan DO Entered By: Kalman Shan on 07/24/2022 10:57:57 -------------------------------------------------------------------------------- Problem List Details Patient Name: Date of Service: [name]. 07/24/2022 10:15 A M Medical Record Number: [id number] Patient Account Number: [id number] Date of Birth/Sex: Treating RN: [birth year] (67 y.o. 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 07/24/2022 10:15 A M Medical Record Number: [id number] Patient Account Number: [id number] Date of Birth/Sex: Treating RN: May 04, 1956 (67 y.o. F) Primary Care Provider: Juluis Mire Other Clinician: Referring Provider: Treating Provider/Extender: Edmonia Lynch in Treatment: 2 Subjective Chief Complaint Information obtained from Patient 07/06/2022; left-sided back wound History of Present Illness (HPI) 07/06/2022 [name] is a 67 year old female with a past medical history of controlled type 2 diabetes on oral agents and essential hypertension that presents to clinic for a 1-2 month history of nonhealing  wound to her upper left back. She has visited the ED and urgent care on several occasions for this issue. The area had started out as an abscess and this was IandD need on 06/20/2022 in urgent care. This cultured MRSA and patient was given doxycycline. She states she completed this course 1 week ago. Currently she has been keeping the area covered. She currently denies systemic signs of infection. 1/19; patient presents for follow-up. She has been using Medihoney and Hydrofera Blue to the wound bed. There is been improvement in wound healing. She has no issues or complaints today. 1/30; patient presents for follow-up. Patient's been using Medihoney and Hydrofera Blue to the wound bed. The area has healed. Patient History Information obtained from Patient, Chart. Family History Unknown History. Social History Former smoker, Marital Status - Widowed, Alcohol Use - Never, Drug Use - No History, Caffeine Use - Rarely. 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 Vitals Time Taken: 10:30 AM, Temperature: 97.8 F. Psychiatric pleasant and cooperative. General Notes: Left upper back side with epithelization to the previous wound site. No induration noted. No drainage. Integumentary (Hair, Skin) Wound #1 status is Healed - Epithelialized. Original cause of wound was Bump. The date acquired was: 05/25/2022. The wound has been in treatment 2 weeks. The wound is located on the Left Back. The wound  measures 0cm length x 0cm width x 0cm depth; 0cm^2 area and 0cm^3 volume. There is Fat Layer (Subcutaneous Tissue) exposed. There is no tunneling or undermining noted. There is a medium amount of serosanguineous drainage noted. The wound margin is distinct with the outline attached to the wound base. There is large (67-100%) pink granulation within the wound bed. There is a small (1-33%) amount of necrotic tissue within the wound bed. The periwound skin appearance did not exhibit: Callus, Crepitus, Excoriation, Induration, Rash, Scarring, Dry/Scaly, Maceration, Atrophie Blanche, Cyanosis, Ecchymosis, Hemosiderin Staining, Mottled, Pallor, Rubor, Erythema. Periwound temperature was noted as No Abnormality. The periwound has tenderness on palpation. 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  The wound is healed. Nothing further to do. She may follow-up as needed. Plan Discharge From [facility] Services: Discharge from [place] 1. Discharge from clinic due to closed wound 2. Follow-up as needed Electronic Signature(s) Signed: 07/24/2022 11:41:42 AM By: Kalman Shan DO Entered By: Kalman Shan on 07/24/2022 10:58:34 -------------------------------------------------------------------------------- HxROS Details Patient Name: Date of Service: [name]. 07/24/2022 10:15 A M Medical Record Number: [id number] Patient Account Number: [id number] Date of Birth/Sex: Treating RN: [birth year] (67 y.o. F) Primary Care Provider: Juluis Mire Other Clinician: Referring Provider: Treating Provider/Extender: Edmonia Lynch in Treatment: 2 [name], [name] ([id number]) [id number] Page 5 of 6 Information Obtained  From Patient Chart Hematologic/Lymphatic Medical History: Positive for: Anemia Cardiovascular Medical History: Positive for: Hypertension Past Medical History Notes: hyperlipidemia Endocrine Medical History: Positive for: Type II Diabetes Musculoskeletal Medical History: Positive for: Osteoarthritis - all over Neurologic Medical History: Past Medical History Notes: TIA 06/19/2017; aneurysm, small, on L side of brain Immunizations Pneumococcal Vaccine: Received Pneumococcal Vaccination: Yes Received Pneumococcal Vaccination On or After 60th Birthday: Yes Implantable Devices None Family and Social History Unknown History: Yes; Former smoker; Marital Status - Widowed; Alcohol Use: Never; Drug Use: No History; Caffeine Use: Rarely; Financial Concerns: No; Food, Clothing or Shelter Needs: No; Support System Lacking: No; Transportation Concerns: No Electronic Signature(s) Signed: 07/24/2022 11:41:42 AM By: Kalman Shan DO Entered By: Kalman Shan on 07/24/2022 10:57:25 -------------------------------------------------------------------------------- SuperBill Details Patient Name: Date of Service: [name]. 07/24/2022 Medical Record Number: [id number] Patient Account Number: [id number] Date of Birth/Sex: Treating RN: 1955/08/25 (67 y.o. 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 L02.212 Cutaneous abscess of back [any part, except buttock] E11.622 Type 2 diabetes mellitus with other skin ulcer Facility Procedures [name], [name] ([id number]): CPT4 Code Description [id number] [place] VISIT-LEV 3 EST PT [id number] Page 6 of 6: Modifier Quantity 1 Physician Procedures : CPT4 Code  Description Modifier [phone] [id number] - WC PHYS LEVEL 3 - EST PT ICD-10 Diagnosis Description L98.428 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

## 2022-07-26 ENCOUNTER — Ambulatory Visit (INDEPENDENT_AMBULATORY_CARE_PROVIDER_SITE_OTHER): Payer: Medicare Other | Admitting: Primary Care

## 2022-07-26 ENCOUNTER — Encounter (INDEPENDENT_AMBULATORY_CARE_PROVIDER_SITE_OTHER): Payer: Self-pay | Admitting: Primary Care

## 2022-07-26 VITALS — BP 150/98 | HR 99 | Resp 16 | Wt 116.2 lb

## 2022-07-26 DIAGNOSIS — Z09 Encounter for follow-up examination after completed treatment for conditions other than malignant neoplasm: Secondary | ICD-10-CM

## 2022-07-26 DIAGNOSIS — I10 Essential (primary) hypertension: Secondary | ICD-10-CM | POA: Diagnosis not present

## 2022-07-26 DIAGNOSIS — M79601 Pain in right arm: Secondary | ICD-10-CM | POA: Diagnosis not present

## 2022-07-26 MED ORDER — IBUPROFEN 600 MG PO TABS: 600.0000 mg | ORAL_TABLET | Freq: Four times a day (QID) | ORAL | 0 refills | Status: DC | PRN

## 2022-07-26 NOTE — Patient Instructions (Signed)
DASH stands for "Dietary Approaches to Stop Hypertension." The DASH eating plan is a healthy eating plan that has been shown to reduce high blood pressure (hypertension). Additional health benefits may include reducing the risk of type 2 diabetes mellitus, heart disease, and stroke. The DASH eating plan may also help with weight loss.  WHAT DO I NEED TO KNOW ABOUT THE DASH EATING PLAN? For the DASH eating plan, you will follow these general guidelines: Choose foods with a percent daily value for sodium of less than 5% (as listed on the food label). Use salt-free seasonings or herbs instead of table salt or sea salt. Check with your health care provider or pharmacist before using salt substitutes. Eat lower-sodium products, often labeled as "lower sodium" or "no salt added." Eat fresh foods. Eat more vegetables, fruits, and low-fat dairy products. Choose whole grains. Look for the word "whole" as the first word in the ingredient list. Choose fish and skinless chicken or [place] more often than red meat. Limit fish, poultry, and meat to 6 oz (170 g) each day. Limit sweets, desserts, sugars, and sugary drinks. Choose heart-healthy fats. Limit cheese to 1 oz (28 g) per day. Eat more home-cooked food and less restaurant, buffet, and fast food. Limit fried foods. Cook foods using methods other than frying. Limit canned vegetables. If you do use them, rinse them well to decrease the sodium. When eating at a restaurant, ask that your food be prepared with less salt, or no salt if possible.  WHAT FOODS CAN I EAT? Seek help from a dietitian for individual calorie needs.  Grains Whole grain or whole wheat bread. Brown rice. Whole grain or whole wheat pasta. Quinoa, bulgur, and whole grain cereals. Low-sodium cereals. Corn or whole wheat flour tortillas. Whole grain cornbread. Whole grain crackers. Low-sodium crackers.  Vegetables Fresh or frozen vegetables (raw, steamed, roasted, or grilled).  Low-sodium or reduced-sodium tomato and vegetable juices. Low-sodium or reduced-sodium tomato sauce and paste. Low-sodium or reduced-sodium canned vegetables.   Fruits All fresh, canned (in natural juice), or frozen fruits.  Meat and Other Protein Products Ground beef (85% or leaner), grass-fed beef, or beef trimmed of fat. Skinless chicken or [place]. Ground chicken or [place]. Pork trimmed of fat. All fish and seafood. Eggs. Dried beans, peas, or lentils. Unsalted nuts and seeds. Unsalted canned beans.  Dairy Low-fat dairy products, such as skim or 1% milk, 2% or reduced-fat cheeses, low-fat ricotta or cottage cheese, or plain low-fat yogurt. Low-sodium or reduced-sodium cheeses.  Fats and Oils Tub margarines without trans fats. Light or reduced-fat mayonnaise and salad dressings (reduced sodium). Avocado. Safflower, olive, or canola oils. Natural peanut or almond butter.  Other Unsalted popcorn and pretzels. The items listed above may not be a complete list of recommended foods or beverages. Contact your dietitian for more options.  WHAT FOODS ARE NOT RECOMMENDED?  Grains White bread. White pasta. White rice. Refined cornbread. Bagels and croissants. Crackers that contain trans fat.  Vegetables Creamed or fried vegetables. Vegetables in a cheese sauce. Regular canned vegetables. Regular canned tomato sauce and paste. Regular tomato and vegetable juices.  Fruits Dried fruits. Canned fruit in light or heavy syrup. Fruit juice.  Meat and Other Protein Products Fatty cuts of meat. Ribs, chicken wings, bacon, sausage, bologna, salami, chitterlings, fatback, hot dogs, bratwurst, and packaged luncheon meats. Salted nuts and seeds. Canned beans with salt.  Dairy Whole or 2% milk, cream, half-and-half, and cream cheese. Whole-fat or sweetened yogurt. Full-fat cheeses or blue cheese.  Nondairy creamers and whipped toppings. Processed cheese, cheese spreads, or cheese  curds.  Condiments Onion and garlic salt, seasoned salt, table salt, and sea salt. Canned and packaged gravies. Worcestershire sauce. Tartar sauce. Barbecue sauce. Teriyaki sauce. Soy sauce, including reduced sodium. Steak sauce. Fish sauce. Oyster sauce. Cocktail sauce. Horseradish. Ketchup and mustard. Meat flavorings and tenderizers. Bouillon cubes. Hot sauce. Tabasco sauce. Marinades. Taco seasonings. Relishes.  Fats and Oils Butter, stick margarine, lard, shortening, ghee, and bacon fat. Coconut, palm kernel, or palm oils. Regular salad dressings.  Other Pickles and olives. Salted popcorn and pretzels.  The items listed above may not be a complete list of foods and beverages to avoid. Contact your dietitian for more information.

## 2022-07-26 NOTE — Progress Notes (Signed)
[name], is a 67 y.o. female  DTO:[id number]  XIP:[id number]  DOB - Sep 22, 1955  Chief Complaint  Patient presents with   Hospitalization Follow-up    ED       Subjective:   [name] is a 67 y.o. female here today for a emergency room follow up visit.  She presented to the emergency room with right shoulder and arm pain after one of her patient/clients twisted and pulling on her as they were trying to change him.  Blood pressure is elevated today she admits to missing 2 days of blood pressure medication and also contributed to being in pain.  Her right arm and hands are swollen and with passive range of motion pain becomes worse.  Patient has No headache, No chest pain, No abdominal pain - No Nausea, No new weakness tingling or numbness, No Cough - shortness of breath  No problems updated.  No Known Allergies  Past Medical History:  Diagnosis Date   Anemia    hx   Aneurysm ([place])    small, on left side of brain    Arthritis    "all over" (06/19/2017)   Diabetes mellitus without complication (HCC)    diet control, pt denies   Hyperlipidemia    Hypertension    TIA (transient ischemic attack)    pt unaware of this hx on 06/19/2017    Current Outpatient Medications on File Prior to Visit  Medication Sig Dispense Refill   amLODipine (NORVASC) 10 MG tablet Take 1 tablet (10 mg total) by mouth daily. 90 tablet 1   DULoxetine (CYMBALTA) 30 MG capsule TAKE ONE CAPSULE BY MOUTH TWICE DAILY @ 9AM & 5PM 180 capsule 1   hydrOXYzine (ATARAX) 25 MG tablet Take 1 tablet (25 mg total) by mouth every 6 (six) hours. 12 tablet 1   ibuprofen (ADVIL) 600 MG tablet Take 1 tablet (600 mg total) by mouth every 6 (six) hours as needed. 30 tablet 0   losartan-hydrochlorothiazide (HYZAAR) 100-25 MG tablet Take 1 tablet by mouth daily. 90 tablet 1   meclizine (ANTIVERT) 12.5 MG tablet Take 1 tablet (12.5 mg total) by mouth 3 (three) times daily as needed  for dizziness. 60 tablet 1   methocarbamol (ROBAXIN) 500 MG tablet Take 1 tablet (500 mg total) by mouth every 8 (eight) hours as needed for muscle spasms. 20 tablet 0   metoprolol succinate (TOPROL-XL) 25 MG 24 hr tablet Take 1 tablet (25 mg total) by mouth daily. (Patient taking differently: Take 50 mg by mouth daily.) 90 tablet 1   ondansetron (ZOFRAN) 4 MG tablet Take 1 tablet (4 mg total) by mouth every 6 (six) hours. 12 tablet 0   pantoprazole (PROTONIX) 40 MG tablet TAKE ONE TABLET ('40mg'$  total) BY MOUTH DAILY AT 9AM 30 tablet 0   pregabalin (LYRICA) 50 MG capsule Take 50 mg by mouth 3 (three) times daily.     rosuvastatin (CRESTOR) 40 MG tablet Take 1 tablet (40 mg total) by mouth daily. 90 tablet 3   sitaGLIPtin (JANUVIA) 25 MG tablet Take 1 tablet (25 mg total) by mouth daily. 90 tablet 1   No current facility-administered medications on file prior to visit.    Objective:   Vitals:   07/26/22 1426  BP: (Abnormal) 177/106  Pulse: 99  Resp: 16  SpO2: 98%  Weight: 116 lb 3.2 oz (52.7 kg)    Exam General appearance : Awake, alert, not in any distress. Speech Clear. Not toxic looking  HEENT: Atraumatic and Normocephalic, pupils equally reactive to light and accomodation Neck: Supple, no JVD. No cervical lymphadenopathy.  Chest: Good air entry bilaterally, no added sounds  CVS: S1 S2 regular, no murmurs.  Abdomen: Bowel sounds present, Non tender and not distended with no gaurding, rigidity or rebound. Extremities: right arm increase pain with pronation, supination , ROM and flexion at elbowB/L Lower Ext shows no edema, both legs are warm to touch Neurology: Awake alert, and oriented X 3, CN II-XII intact, Non focal Skin: No Rash  Data Review Lab Results  Component Value Date   HGBA1C 7.1 (A) 06/04/2022   HGBA1C 7.2 (A) 02/16/2022   HGBA1C 7.1 (A) 11/14/2021    Assessment & Plan  [name] was seen today for hospitalization follow-up.  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 DASH Eating Plan WHERE CAN I FIND MORE INFORMATION? National Heart, Lung, and Blood Institute: travelstabloid.com Document Released: 05/31/2011 Document Revised: 10/26/2013 Document Reviewed: 04/15/2013 [facility] Patient Information 2015 [place], [place]. This information is not intended to replace advice given to you by your health care provider. Make sure you discuss any questions you have with your health care provider.   I think that you would greatly benefit from seeing a nutritionist. Please inform at follow up visit       Patient have been counseled extensively about nutrition and exercise. Other issues discussed during this visit include: low cholesterol diet, weight control and daily exercise, foot care, annual eye examinations at Ophthalmology, importance of adherence with medications and regular follow-up. We also discussed long term complications of uncontrolled diabetes and hypertension.   No follow-ups on file.  The patient was given clear instructions to go to ER or return to medical center if symptoms don't improve, worsen or new problems develop. The patient verbalized understanding. The patient was told to call to get lab results if they haven't heard anything in the next week.   This note has been created with Surveyor, quantity. Any transcriptional errors are unintentional.   [name], NP 07/26/2022, 2:55 PM

## 2022-07-27 ENCOUNTER — Other Ambulatory Visit (INDEPENDENT_AMBULATORY_CARE_PROVIDER_SITE_OTHER): Payer: Self-pay | Admitting: Primary Care

## 2022-07-27 ENCOUNTER — Ambulatory Visit (HOSPITAL_BASED_OUTPATIENT_CLINIC_OR_DEPARTMENT_OTHER): Payer: Medicare HMO | Admitting: Internal Medicine

## 2022-07-27 NOTE — Telephone Encounter (Signed)
Medication Refill - Medication: metoprolol succinate (TOPROL-XL) 25 MG 24 hr tablet [[id number]]   Has the patient contacted their pharmacy? Yes.   (Agent: If no, request that the patient contact the pharmacy for the refill. If patient does not wish to contact the pharmacy document the reason why and proceed with request.) (Agent: If yes, when and what did the pharmacy advise?)  Preferred Pharmacy (with phone number or street name):  [facility] - [place], [place] - [place] [address]  [address] [place] [address]  Phone: [phone] Fax: [phone]  Hours: Not open 24 hours   Has the patient been seen for an appointment in the last year OR does the patient have an upcoming appointment? Yes.    Agent: Please be advised that RX refills may take up to 3 business days. We ask that you follow-up with your pharmacy.

## 2022-07-27 NOTE — Telephone Encounter (Signed)
Requested medications are due for refill today.  NO  Requested medications are on the active medications list.  yes  Last refill. 06/04/2022 #90 1 rf  Future visit scheduled.   yes  Notes to clinic.  Pt reports taking '50mg'$  daily. Order is for 25 mg daily. Please advise.    Requested Prescriptions  Pending Prescriptions Disp Refills   metoprolol succinate (TOPROL-XL) 25 MG 24 hr tablet 90 tablet 1    Sig: Take 1 tablet (25 mg total) by mouth daily.     Cardiovascular:  Beta Blockers Failed - 07/27/2022  5:55 PM      Failed - Last BP in normal range    BP Readings from Last 1 Encounters:  07/26/22 (!) 150/98         Passed - Last Heart Rate in normal range    Pulse Readings from Last 1 Encounters:  07/26/22 99         Passed - Valid encounter within last 6 months    Recent Outpatient Visits           [facility] discharge follow-up   [place], Michelle P, NP   1 month ago Type 2 diabetes mellitus without complication, without long-term current use of insulin ([place])   [place] [name], NP   3 months ago Diabetes mellitus type 2 in nonobese [facility])   [place], Michelle P, NP   5 months ago Type 2 diabetes mellitus without complication, without long-term current use of insulin ([place])   [place]o, Michelle P, NP   7 months ago Adjustment disorder with mixed anxiety and depressed mood   [place] [facility] [name], NP       Future Appointments             In 3 weeks Oletta Lamas Milford Cage, NP [place]   In 10 months [place], [place], [place] [facility]

## 2022-08-13 NOTE — Progress Notes (Signed)
FANTASHA, SHROFF Hale ([id number]) [id number] Page 1 of 8 Visit Report for 07/06/2022 Chief Complaint Document Details Patient Name: Date of Service: [name], [name] 07/06/2022 8:00 A M Medical Record Number: [id number] Patient Account Number: [id number] Date of Birth/Sex: Treating RN: 07-Jan-1956 (68 y.o. F) Primary Care Provider: Juluis Mire Other Clinician: Referring Provider: Treating Provider/Extender: Edmonia Lynch in Treatment: 0 Information Obtained from: Patient Chief Complaint 07/06/2022; left-sided back wound Electronic Signature(s) Signed: 07/06/2022 11:36:12 AM By: Kalman Shan DO Entered By: Kalman Shan on 07/06/2022 09:21:00 -------------------------------------------------------------------------------- Debridement Details Patient Name: Date of Service: Marcia Natter Hale. 07/06/2022 8:00 A M Medical Record Number: [id number] Patient Account Number: [id number] Date of Birth/Sex: Treating RN: July 29, 1955 (67 y.o. Tonita Phoenix, Lauren Primary Care Provider: Juluis Mire Other Clinician: Referring Provider: Treating Provider/Extender: Edmonia Lynch in Treatment: 0 Debridement Performed for Assessment: Wound #1 Left Back Performed By: Physician Kalman Shan, DO Debridement Type: Debridement Level of Consciousness (Pre-procedure): Awake and Alert Pre-procedure Verification/Time Out Yes - 09:05 Taken: Start Time: 09:05 Pain Control: Lidocaine T Area Debrided (L x W): otal 2 (cm) x 2 (cm) = 4 (cm) Tissue and other material debrided: Viable, Non-Viable, Slough, Subcutaneous, Slough Level: Skin/Subcutaneous Tissue Debridement Description: Excisional Instrument: Curette Bleeding: Minimum Hemostasis Achieved: Pressure End Time: 09:05 Procedural Pain: 0 Post Procedural Pain: 0 Response to Treatment: Procedure was tolerated well Level of Consciousness (Post- Awake  and Alert procedure): Post Debridement Measurements of Total Wound Length: (cm) 2 Width: (cm) 2 Depth: (cm) 0.3 Volume: (cm) 0.942 Character of Wound/Ulcer Post Debridement: Improved Post Procedure Diagnosis [name], [name] ([id number]) [id number] Page 2 of 8 Same as Pre-procedure Electronic Signature(s) Signed: 07/06/2022 11:36:12 AM By: Kalman Shan DO Signed: 08/13/2022 10:40:58 AM By: [name] RN Entered By: [name] on 07/06/2022 09:05:03 -------------------------------------------------------------------------------- HPI Details Patient Name: Date of Service: Marcia Natter Hale. 07/06/2022 8:00 A M Medical Record Number: [id number] Patient Account Number: [id number] Date of Birth/Sex: Treating RN: 03-09-1956 (67 y.o. F) Primary Care Provider: Juluis Mire Other Clinician: Referring Provider: Treating Provider/Extender: Edmonia Lynch in Treatment: 0 History of Present Illness HPI Description: 07/06/2022 [name] is a 67 year old female with a past medical history of controlled type 2 diabetes on oral agents and essential hypertension that presents to clinic for a 1-2 month history of nonhealing wound to her upper left back. She has visited the ED and urgent care on several occasions for this issue. The area had started out as an abscess and this was IandD need on 06/20/2022 in urgent care. This cultured MRSA and patient was given doxycycline. She states she completed this course 1 week ago. Currently she has been keeping the area covered. She currently denies systemic signs of infection. Electronic Signature(s) Signed: 07/06/2022 11:36:12 AM By: Kalman Shan DO Entered By: Kalman Shan on 07/06/2022 09:27:13 -------------------------------------------------------------------------------- Physical Exam Details Patient Name: Date of Service: Marcia Natter Hale. 07/06/2022 8:00  A M Medical Record Number: [id number] Patient Account Number: [id number] Date of Birth/Sex: Treating RN: [birth year] (67 y.o. F) Primary Care Provider: Juluis Mire Other Clinician: Referring Provider: Treating Provider/Extender: Edmonia Lynch in Treatment: 0 Constitutional respirations regular, non-labored and within target range for patient.[name] Psychiatric pleasant and cooperative. Notes Left upper back side with an open wound with granulation tissue and nonviable tissue. No signs of surrounding soft tissue infection including increased warmth, erythema or purulent drainage Electronic Signature(s) Signed: 07/06/2022 11:36:12 AM By: Kalman Shan DO Entered  By: Kalman Shan on 07/06/2022 09:26:30 Marcia Frees Hale ([id number]) [id number] Page 3 of 8 -------------------------------------------------------------------------------- Physician Orders Details Patient Name: Date of Service: [name], [name] 07/06/2022 8:00 A M Medical Record Number: [id number] Patient Account Number: [id number] Date of Birth/Sex: Treating RN: [birth year] (67 y.o. 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 - w/ Dr. Heber Como next Friday 07/13/22 @ 1100 Rm # 9 Return A Anesthetic (In clinic) Topical Lidocaine 5% applied to wound bed Bathing/ Shower/ Hygiene May shower with protection but do not get wound dressing(s) wet. Protect dressing(s) with water repellant cover (for example, large plastic bag) or a cast cover and may then take shower. - YOu may shower  without the bandage on as well. YOu can use dial antibacterial soap to clean the wound in the shower. BE sure to dress immediately after getting our of the shower. Wound Treatment Wound #1 - Back Wound Laterality: Left Cleanser: Soap and Water 1 x Per [id number] Days Discharge Instructions: May shower and wash wound with dial antibacterial soap and water prior to dressing change. Cleanser: Wound Cleanser (DME) (Generic) 1 x Per Day/15 Days Discharge Instructions: Cleanse the wound with wound cleanser prior to applying a clean dressing using gauze sponges, not tissue or cotton balls. Prim Dressing: Hydrofera Blue Ready Transfer Foam, 2.5x2.5 (in/in) (DME) (Generic) 1 x Per Day/15 Days ary Discharge Instructions: Apply directly to wound bed as directed Prim Dressing: MediHoney Gel, tube 1.5 (oz) 1 x Per Day/15 Days ary Discharge Instructions: Apply to wound bed as instructed Secondary Dressing: Woven Gauze Sponge, Non-Sterile 4x4 in (DME) (Generic) 1 x Per Day/15 Days Discharge Instructions: Apply over primary dressing as directed. Secondary Dressing: Zetuvit Plus Silicone Border Dressing 4x4 (in/in) (DME) (Generic) 1 x Per Day/15 Days Discharge Instructions: Apply silicone border over primary dressing as directed. Patient Medications llergies: No Known Allergies A Notifications Medication Indication Start End PRN debridements/pain1/05/2023 lidocaine DOSE topical 5 % gel - gel topical once daily Electronic Signature(s) Signed: 07/06/2022 11:36:12 AM By: Kalman Shan DO Entered By: Kalman Shan on 07/06/2022 09:26:37 Marcia Frees Hale ([id number]) [id number] Page 4 of 8 -------------------------------------------------------------------------------- Problem List Details Patient Name: Date of Service: [name], [name] 07/06/2022 8:00 A M Medical Record Number: [id number] Patient Account Number: [id number] Date of Birth/Sex: Treating RN: 06-Aug-1955 (67  y.o. 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 07/06/2022 8:00 A M Medical Record Number: [id number] Patient Account Number: [id number] Date of Birth/Sex: Treating RN: [birth year] (67 y.o. F) Primary Care Provider: Juluis Mire Other Clinician: Referring Provider: Treating Provider/Extender: Edmonia Lynch in Treatment: 0 Subjective Chief Complaint Information obtained from Patient 07/06/2022; left-sided back wound History  of Present Illness (HPI) 07/06/2022 [name] is a 67 year old female with a past medical history of controlled type 2 diabetes on oral agents and essential hypertension that presents to clinic for a 1-2 month history of nonhealing wound to her upper left back. She has visited the ED and urgent care on several occasions for this issue. The area had started out as an abscess and this was IandD need on 06/20/2022 in urgent care. This cultured MRSA and patient was given doxycycline. She states she completed this course 1 week ago. Currently she has been keeping the area covered. She currently denies systemic signs of infection. Patient History Information obtained from Patient,  Chart. [name], [name] ([id number]) [id number] Page 5 of 8 Allergies No Known Allergies Family History Unknown History. Social History Former smoker, Marital Status - Widowed, Alcohol Use - Never, Drug Use - No History, Caffeine Use - Rarely. 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 Eyes Denies complaints or symptoms of Dry Eyes, Vision Changes, Glasses / Contacts. Ear/Nose/Mouth/Throat Denies complaints or symptoms of Chronic sinus problems or rhinitis. Respiratory Denies complaints or symptoms of Chronic or frequent coughs, Shortness of Breath. Gastrointestinal Denies complaints or symptoms of Frequent diarrhea, Nausea, Vomiting. Endocrine Denies complaints or symptoms of Heat/cold intolerance. Genitourinary Denies complaints or symptoms of Frequent urination. Integumentary (Skin) Complains or has symptoms of Wounds. Psychiatric Denies complaints or symptoms of Claustrophobia. Objective Constitutional respirations regular, non-labored and within target range for patient.. Vitals Time Taken: 8:26 AM, Temperature: 97.6 F, Pulse: 85 bpm, Respiratory Rate: 17 breaths/min, Blood Pressure: 166/99 mmHg. Psychiatric pleasant and cooperative. General Notes: Left upper back side with an open wound with granulation tissue and nonviable tissue. No signs of surrounding soft tissue infection including increased warmth, erythema or purulent drainage Integumentary (Hair, Skin) Wound #1  status is Open. Original cause of wound was Bump. The date acquired was: 05/25/2022. The wound is located on the Left Back. The wound measures 2cm length x 2cm width x 0.3cm depth; 3.142cm^2 area and 0.942cm^3 volume. There is Fat Layer (Subcutaneous Tissue) exposed. There is no tunneling or undermining noted. There is a medium amount of serosanguineous drainage noted. The wound margin is distinct with the outline attached to the wound base. There is small (1-33%) red, pink granulation within the wound bed. There is a large (67-100%) amount of necrotic tissue within the wound bed including Eschar and Adherent Slough. The periwound skin appearance did not exhibit: Callus, Crepitus, Excoriation, Induration, Rash, Scarring, Dry/Scaly, Maceration, Atrophie Blanche, Cyanosis, Ecchymosis, Hemosiderin Staining, Mottled, Pallor, Rubor, Erythema. Periwound temperature was noted as No Abnormality. The periwound has tenderness on palpation. 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 This was IandD by urgent care and culture grew MRSA. She has completed the appropriate antibiotics. I debrided nonviable tissue. I did not see any signs of infection  today. I recommended Medihoney and Hydrofera Blue daily. Follow-up in 1 week. Procedures Wound #1 Pre-procedure diagnosis of Wound #1 is an Abscess located on the Left Back . There was a Excisional Skin/Subcutaneous Tissue Debridement with a total area of 4 sq cm performed by Kalman Shan, DO. With the following instrument(s): Curette to remove Viable and Non-Viable tissue/material. Material removed includes  Subcutaneous Tissue and Slough and after achieving pain control using Lidocaine. No specimens were taken. A time out was conducted at 09:05, prior to the start of the procedure. A Minimum amount of bleeding was controlled with Pressure. The procedure was tolerated well with a pain level of 0 throughout and a pain level of 0 following the procedure. Post Debridement Measurements: 2cm length x 2cm width x 0.3cm depth; 0.942cm^3 volume. Character of Wound/Ulcer Post Debridement is improved. Post procedure Diagnosis Wound #1: Same as Pre-Procedure Plan Follow-up Appointments: Return Appointment in 1 week. - w/ Dr. Heber Waggoner next Friday 07/13/22 @ 1100 Rm # 9 Anesthetic: (In clinic) Topical Lidocaine 5% applied to wound bed Bathing/ Shower/ Hygiene: May shower with protection but do not get wound dressing(s) wet. Protect dressing(s) with water repellant cover (for example, large plastic bag) or a cast cover and may then take shower. - YOu may shower without the bandage on as well. YOu can use dial antibacterial soap to clean the wound in the shower. BE sure to dress immediately after getting our of the shower. The following medication(s) was prescribed: lidocaine topical 5 % gel gel topical once daily for PRN debridements/pain was prescribed at facility WOUND #1: - Back Wound Laterality: Left Cleanser: Soap and Water 1 x Per Day/15 Days Discharge Instructions: May shower and wash wound with dial antibacterial soap and water prior to dressing change. Cleanser: Wound Cleanser (DME) (Generic) 1 x Per Day/15 Days Discharge Instructions: Cleanse the wound with wound cleanser prior to applying a clean dressing using gauze sponges, not tissue or cotton balls. Prim Dressing: Hydrofera Blue Ready Transfer Foam, 2.5x2.5 (in/in) (DME) (Generic) 1 x Per Day/15 Days ary Discharge Instructions: Apply directly to wound bed as directed Prim Dressing: MediHoney Gel, tube 1.5 (oz) 1 x Per Day/15 Days ary Discharge  Instructions: Apply to wound bed as instructed Secondary Dressing: Woven Gauze Sponge, Non-Sterile 4x4 in (DME) (Generic) 1 x Per Day/15 Days Discharge Instructions: Apply over primary dressing as directed. Secondary Dressing: Zetuvit Plus Silicone Border Dressing 4x4 (in/in) (DME) (Generic) 1 x Per Day/15 Days Discharge Instructions: Apply silicone border over primary dressing as directed. 1. In office sharp debridement 2. Medihoney and Hydrofera Blue 3. Follow-up in 1 week Electronic Signature(s) Signed: 07/06/2022 11:36:12 AM By: Kalman Shan DO Entered By: Kalman Shan on 07/06/2022 09:29:13 -------------------------------------------------------------------------------- HxROS Details Patient Name: Date of Service: Marcia Natter Hale. 07/06/2022 8:00 A M Medical Record Number: [id number] Patient Account Number: [id number] Date of Birth/Sex: Treating RN: Dec 03, 1955 (67 y.o. Tonita Phoenix, Lauren Primary Care Provider: Juluis Mire Other Clinician: Referring Provider: Treating Provider/Extender: Edmonia Lynch in Treatment: 0 Information Obtained From Patient Chart [name], [name] ([id number]) [id number] Page 7 of 8 Constitutional Symptoms (General Health) Complaints and Symptoms: Negative for: Fatigue; Fever; Chills; Marked Weight Change Eyes Complaints and Symptoms: Negative for: Dry Eyes; Vision Changes; Glasses / Contacts Ear/Nose/Mouth/Throat Complaints and Symptoms: Negative for: Chronic sinus problems or rhinitis Respiratory Complaints and Symptoms: Negative for: Chronic or frequent coughs; Shortness of Breath Gastrointestinal Complaints and Symptoms: Negative for: Frequent diarrhea; Nausea; Vomiting Endocrine Complaints and Symptoms: Negative for:  Heat/cold intolerance Medical History: Positive for: Type II Diabetes Genitourinary Complaints and Symptoms: Negative for: Frequent  urination Integumentary (Skin) Complaints and Symptoms: Positive for: Wounds Psychiatric Complaints and Symptoms: Negative for: Claustrophobia Hematologic/Lymphatic Medical History: Positive for: Anemia Cardiovascular Medical History: Positive for: Hypertension Past Medical History Notes: hyperlipidemia Immunological Musculoskeletal Medical History: Positive for: Osteoarthritis - all over Neurologic Medical History: Past Medical History Notes: 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/19/2017; aneurysm, small, on L side of brain Oncologic Immunizations [name], [name] ([id number]) [id number] Page 8 of 8 Pneumococcal Vaccine: Received Pneumococcal Vaccination: Yes Received Pneumococcal Vaccination On or After 60th Birthday: Yes Implantable Devices None Family and Social History Unknown History: Yes; Former smoker; Marital Status - Widowed; Alcohol Use: Never; Drug Use: No History; Caffeine Use: Rarely; Financial Concerns: No; Food, Clothing or Shelter Needs: No; Support System Lacking: No; Transportation Concerns: No Electronic Signature(s) Signed: 07/06/2022 11:36:12 AM By: Kalman Shan DO Signed: 08/13/2022 10:40:58 AM By: [name] RN Entered By: [name] on 07/05/2022 11:45:16 -------------------------------------------------------------------------------- SuperBill Details Patient Name: Date of Service: Marcia Natter Hale. 07/06/2022 Medical Record Number: [id number] Patient Account Number: [id number] Date of Birth/Sex: Treating RN: 19-Apr-1956 (67 y.o. 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 L02.212 Cutaneous abscess of back [any part, except buttock] E11.622 Type 2 diabetes mellitus  with other skin ulcer Facility Procedures : CPT4 Code: [id number] Description: 99213 - WOUND CARE VISIT-LEV 3 EST PT Modifier: Quantity: 1 : CPT4 Code: [id number] Description: [id number] - DEB SUBQ TISSUE 20 SQ CM/< ICD-10 Diagnosis Description L98.428 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 E11.622 Type 2 diabetes mellitus with other skin ulcer Modifier: Quantity: 1 Physician Procedures : CPT4 Code Description Modifier [id number] - WC PHYS LEVEL 3 - EST PT ICD-10 Diagnosis Description L98.428 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

## 2022-08-13 NOTE — Progress Notes (Signed)
[name] ([id number]) [phone].pdf Page 1 of 4 Visit Report for 07/06/2022 Abuse Risk Screen Details Patient Name: Date of Service: [name], [name] 07/06/2022 8:00 A M Medical Record Number: [id number] Patient Account Number: [id number] Date of Birth/Sex: Treating RN: 01/25/1956 (67 y.o. Tonita Phoenix, Lauren Primary Care Drystan Reader: Juluis Mire Other Clinician: Referring Lycan Davee: Treating Faaris Arizpe/Extender: Edmonia Lynch in Treatment: 0 Abuse Risk Screen Items Answer ABUSE RISK SCREEN: Has anyone close to you tried to hurt or harm you recentlyo No Do you feel uncomfortable with anyone in your familyo No Has anyone forced you do things that you didnt want to doo No Electronic Signature(s) Signed: 08/13/2022 10:40:58 AM By: [name] RN Entered By: [name] on 07/06/2022 08:09:47 -------------------------------------------------------------------------------- Activities of Daily Living Details Patient Name: Date of Service: [name], [name] 07/06/2022 8:00 A M Medical Record Number: [id number] Patient Account Number: [id number] Date of Birth/Sex: Treating RN: [birth year] (67 y.o. Tonita Phoenix, Lauren Primary Care Inri Sobieski: Juluis Mire Other Clinician: Referring Jary Louvier: Treating Makiyla Linch/Extender: Edmonia Lynch in Treatment: 0 Activities of Daily Living Items Answer Activities of Daily Living (Please select one for each item) Drive Automobile Completely Able T Medications ake Completely Able Use T elephone Completely Able Care for Appearance Completely Able Use T oilet Completely Able Bath / Shower Completely Able Dress Self Completely Able Feed Self Completely Able Walk Completely Able Get In / Out Bed Completely Able Housework Completely Able Prepare Meals Completely Able Handle Money Completely Able Shop for Self Completely Able Electronic  Signature(s) Signed: 08/13/2022 10:40:58 AM By: [name] RN Entered By: [name] on 07/06/2022 08:11:07 Shirline Frees Hale ([id number]) [id number] Page 2 of 4 -------------------------------------------------------------------------------- Education Screening Details Patient Name: Date of Service: [name], Hale 07/06/2022 8:00 A M Medical Record Number: [id number] Patient Account Number: [id number] Date of Birth/Sex: Treating RN: 1956/06/18 (67 y.o. Tonita Phoenix, Lauren Primary Care Othmar Ringer: Juluis Mire Other Clinician: Referring Deshannon Hinchliffe: Treating Rosaleen Mazer/Extender: Edmonia Lynch in Treatment: 0 Primary Learner Assessed: Patient Learning Preferences/Education Level/Primary Language Learning Preference: Explanation, Demonstration, Communication Board, Printed Material Highest Education Level: High School Preferred Language: English Cognitive Barrier Language Barrier: No Translator Needed: No Memory Deficit: No Emotional Barrier: No Cultural/Religious Beliefs Affecting Medical Care: No Physical Barrier Impaired Vision: Yes Glasses Impaired Hearing: No Decreased Hand dexterity: No Knowledge/Comprehension Knowledge Level: High Comprehension Level: High Ability to understand written instructions: High Ability to understand verbal instructions: High Motivation Anxiety Level: Calm Cooperation: Cooperative Education Importance: Denies Need Interest in Health Problems: Asks Questions Perception: Coherent Willingness to Engage in Self-Management High Activities: Readiness to Engage in Self-Management High Activities: Electronic Signature(s) Signed: 08/13/2022 10:40:58 AM By: [name] RN Entered By: [name] on 07/06/2022 08:11:34 -------------------------------------------------------------------------------- Fall Risk Assessment Details Patient Name: Date of  Service: Marcia Natter Hale. 07/06/2022 8:00 A M Medical Record Number: [id number] Patient Account Number: [id number] Date of Birth/Sex: Treating RN: 10/28/1955 (67 y.o. Tonita Phoenix, Lauren Primary Care Deonna Krummel: Juluis Mire Other Clinician: Referring Aeriana Speece: Treating Alysiana Ethridge/Extender: Edmonia Lynch in Treatment: 0 Fall Risk Assessment Items Have you had 2 or more falls in the last 12 monthso 0 No [name] ([id number]) [id number] Page 3 of 4 Have you had any fall that resulted in injury in the last 12 monthso 0 No FALLS RISK SCREEN History of falling - immediate or within 3 months 0 No Secondary diagnosis (Do you have 2 or more medical diagnoseso) 0  No Ambulatory aid None/bed rest/wheelchair/nurse 0 No Crutches/cane/walker 0 No Furniture 0 No Intravenous therapy Access/Saline/Heparin Lock 0 No Gait/Transferring Normal/ bed rest/ wheelchair 0 No Weak (short steps with or without shuffle, stooped but able to lift head while walking, may seek 0 No support from furniture) Impaired (short steps with shuffle, may have difficulty arising from chair, head down, impaired 0 No balance) Mental Status Oriented to own ability 0 No Electronic Signature(s) Signed: 08/13/2022 10:40:58 AM By: [name] RN Entered By: [name] on 07/06/2022 08:11:38 -------------------------------------------------------------------------------- Foot Assessment Details Patient Name: Date of Service: Marcia Natter Hale. 07/06/2022 8:00 A M Medical Record Number: [id number] Patient Account Number: [id number] Date of Birth/Sex: Treating RN: 04-Sep-1955 (67 y.o. Tonita Phoenix, Lauren Primary Care Tyrhonda Georgiades: Juluis Mire Other Clinician: Referring Antoney Biven: Treating Terrius Gentile/Extender: Geryl Councilman Weeks in Treatment: 0 Foot Assessment Items Site Locations + = Sensation present, - = Sensation  absent, C = Callus, U = Ulcer R = Redness, W = Warmth, M = Maceration, PU = Pre-ulcerative lesion F = Fissure, S = Swelling, Hale = Dryness Assessment Right: Left: Other Deformity: No No Prior Foot Ulcer: No No Prior Amputation: No No Charcot Joint: No No Ambulatory Status: GaitNAKOTA, ABADIE Hale ([id number]) [id number] Page 4 of 4 Notes N/A no LE wounds Electronic Signature(s) Signed: 08/13/2022 10:40:58 AM By: [name] RN Entered By: [name] on 07/06/2022 08:11:52 -------------------------------------------------------------------------------- Nutrition Risk Screening Details Patient Name: Date of Service: [name], [name]. 07/06/2022 8:00 A M Medical Record Number: [id number] Patient Account Number: [id number] Date of Birth/Sex: Treating RN: 06-25-1956 (67 y.o. Benjaman Lobe Primary Care Tavaughn Silguero: Juluis Mire Other Clinician: Referring Bettyjean Stefanski: Treating Theo Krumholz/Extender: Geryl Councilman Weeks in Treatment: 0 Height (in): Weight (lbs): Body Mass Index (BMI): Nutrition Risk Screening Items Score Screening NUTRITION RISK SCREEN: I have an illness or condition that made me change the kind and/or amount of food I eat 0 No I eat fewer than two meals per day 0 No I eat few fruits and vegetables, or milk products 0 No I have three or more drinks of beer, liquor or wine almost every day 0 No I have tooth or mouth problems that make it hard for me to eat 0 No I don't always have enough money to buy the food I need 0 No I eat alone most of the time 0 No I take three or more different prescribed or over-the-counter drugs a day 0 No Without wanting to, I have lost or gained 10 pounds in the last six months 0 No I am not always physically able to shop, cook and/or feed myself 0 No Nutrition Protocols Good Risk Protocol 0 No interventions needed Moderate Risk Protocol High Risk Proctocol Risk  Level: Good Risk Score: 0 Electronic Signature(s) Signed: 08/13/2022 10:40:58 AM By: [name] RN Entered By: [name] on 07/06/2022 08:11:43

## 2022-08-13 NOTE — Progress Notes (Signed)
Marcia, [name] Hale ([id number]) [id number] Page 1 of 9 Visit Report for 07/06/2022 Allergy List Details Patient Name: Date of Service: [name], [name] 07/06/2022 8:00 A M Medical Record Number: [id number] Patient Account Number: [id number] Date of Birth/Sex: Treating RN: [birth year] (67 y.o. Tonita Phoenix, Lauren Primary Care Anitra Doxtater: Juluis Mire Other Clinician: Referring Kaydance Bowie: Treating Cass Vandermeulen/Extender: Geryl Councilman Weeks in Treatment: 0 Allergies Active Allergies No Known Allergies Allergy Notes Electronic Signature(s) Signed: 08/13/2022 10:40:58 AM By: [name] RN Entered By: [name] on 07/05/2022 11:48:15 -------------------------------------------------------------------------------- Arrival Information Details Patient Name: Date of Service: [name]. 07/06/2022 8:00 A M Medical Record Number: [id number] Patient Account Number: [id number] Date of Birth/Sex: Treating RN: 1956-04-15 (67 y.o. Tonita Phoenix, Lauren Primary Care Serafino Burciaga: Juluis Mire Other Clinician: Referring Toshio Slusher: Treating Yvaine Jankowiak/Extender: Edmonia Lynch in Treatment: 0 Visit Information Patient Arrived: Ambulatory Arrival Time: 08:09 Accompanied By: self Transfer Assistance: None Patient Identification Verified: Yes Secondary Verification Process Completed: Yes Patient Requires Transmission-Based Precautions: No Patient Has Alerts: No Electronic Signature(s) Signed: 08/13/2022 10:40:58 AM By: [name] RN Entered By: [name] on 07/06/2022 08:09:36 -------------------------------------------------------------------------------- Clinic Level of Care Assessment Details Patient Name: Date of Service: [name], [name] 07/06/2022 8:00 A M Medical Record Number: [id number] Patient Account Number: [id number] [name] ([id number])  [id number].pdf Page 2 of 9 Date of Birth/Sex: Treating RN: March 24, 1956 (67 y.o. Tonita Phoenix, Lauren Primary Care Takeia Ciaravino: Juluis Mire Other Clinician: Referring Juana Haralson: Treating Natiya Seelinger/Extender: Edmonia Lynch in Treatment: 0 Clinic Level of Care Assessment Items TOOL 3 Quantity Score X- 1 0 Use when EandM and Procedure is performed on FOLLOW-UP visit ASSESSMENTS - Nursing Assessment / Reassessment X- 1 10 Reassessment of Co-morbidities (includes updates in patient status) X- 1 5 Reassessment of Adherence to Treatment Plan ASSESSMENTS - Wound and Skin Assessment / Reassessment []$  - Points for Wound Assessment can only be taken for a new wound of unknown or different etiology and a procedure is 0 NOT performed to that wound X- 1 5 Simple Wound Assessment / Reassessment - one wound []$  - 0 Complex Wound Assessment / Reassessment - multiple wounds []$  - 0 Dermatologic / Skin Assessment (not related to wound area) ASSESSMENTS - Focused Assessment []$  - 0 Circumferential Edema Measurements - multi extremities []$  - 0 Nutritional Assessment / Counseling / Intervention []$  - 0 Lower Extremity Assessment (monofilament, tuning fork, pulses) []$  - 0 Peripheral Arterial Disease Assessment (using hand held doppler) ASSESSMENTS - Ostomy and/or Continence Assessment and Care []$  - 0 Incontinence Assessment and Management []$  - 0 Ostomy Care Assessment and Management (repouching, etc.) PROCESS - Coordination of Care []$  - Points for Discharge Coordination can only be taken for a new wound of unknown or different etiology and a procedure 0 is NOT performed to that wound X- 1 15 Simple Patient / Family Education for ongoing care []$  - 0 Complex (extensive) Patient / Family Education for ongoing care X- 1 10 Staff obtains Programmer, systems, Records, T Results / Process Orders est []$  - 0 Staff telephones HHA, Nursing Homes / Clarify orders /  etc []$  - 0 Routine Transfer to another Facility (non-emergent condition) []$  - 0 Routine Hospital Admission (non-emergent condition) X- 1 15 New Admissions / Biomedical engineer / Ordering NPWT Apligraf, etc. , []$  - 0 Emergency Hospital Admission (emergent condition) X- 1 10 Simple Discharge Coordination []$  - 0 Complex (extensive) Discharge Coordination PROCESS - Special Needs []$  - 0 Pediatric / Minor Patient  Management []$  - 0 Isolation Patient Management []$  - 0 Hearing / Language / Visual special needs []$  - 0 Assessment of Community assistance (transportation, Hale/C planning, etc.) []$  - 0 Additional assistance / Altered mentation []$  - 0 Support Surface(s) Assessment (bed, cushion, seat, etc.) INTERVENTIONS - Wound Cleansing / Measurement []$  - Points for Wound Cleaning / Measurement, Wound Dressing, Specimen Collection and Specimen taken to lab can only 0 be taken for a new wound of unknown or different etiology and a procedure is NOT performed to that wound X- 1 5 Simple Wound Cleansing - one wound [name], [name] ([id number]) [phone].pdf Page 3 of 9 []$  - 0 Complex Wound Cleansing - multiple wounds X- 1 5 Wound Imaging (photographs - any number of wounds) []$  - 0 Wound Tracing (instead of photographs) X- 1 5 Simple Wound Measurement - one wound []$  - 0 Complex Wound Measurement - multiple wounds INTERVENTIONS - Wound Dressings X - Small Wound Dressing one or multiple wounds 1 10 []$  - 0 Medium Wound Dressing one or multiple wounds []$  - 0 Large Wound Dressing one or multiple wounds INTERVENTIONS - Miscellaneous []$  - 0 External ear exam []$  - 0 Specimen Collection (cultures, biopsies, blood, body fluids, etc.) []$  - 0 Specimen(s) / Culture(s) sent or taken to Lab for analysis []$  - 0 Patient Transfer (multiple staff / Civil Service fast streamer / Similar devices) []$  - 0 Simple Staple / Suture removal (25 or less) []$  - 0 Complex Staple / Suture  removal (26 or more) []$  - 0 Hypo / Hyperglycemic Management (close monitor of Blood Glucose) []$  - 0 Ankle / Brachial Index (ABI) - do not check if billed separately X- 1 5 Vital Signs Has the patient been seen at the hospital within the last three years: Yes Total Score: 100 Level Of Care: New/Established - Level 3 Electronic Signature(s) Signed: 08/13/2022 10:40:58 AM By: [name] RN Entered By: [name] on 07/06/2022 09:11:55 -------------------------------------------------------------------------------- Encounter Discharge Information Details Patient Name: Date of Service: [name]. 07/06/2022 8:00 A M Medical Record Number: [id number] Patient Account Number: [id number] Date of Birth/Sex: Treating RN: August 24, 1955 (67 y.o. Tonita Phoenix, Lauren Primary Care Oria Klimas: Juluis Mire Other Clinician: Referring Dayvian Blixt: Treating Esaias Cleavenger/Extender: Edmonia Lynch in Treatment: 0 Encounter Discharge Information Items Post Procedure Vitals Discharge Condition: Stable Temperature (F): 98.7 Ambulatory Status: Ambulatory Pulse (bpm): 74 Discharge Destination: Home Respiratory Rate (breaths/min): 17 Transportation: Private Auto Blood Pressure (mmHg): 120/80 Accompanied By: self Schedule Follow-up Appointment: Yes Clinical Summary of Care: Patient Declined Electronic Signature(s) Signed: 08/13/2022 10:40:58 AM By: [name] RN Entered By: [name] on 07/06/2022 09:12:29 Shirline Frees Hale ([id number]) [id number] Page 4 of 9 -------------------------------------------------------------------------------- Lower Extremity Assessment Details Patient Name: Date of Service: [name], [name] 07/06/2022 8:00 A M Medical Record Number: [id number] Patient Account Number: [id number] Date of Birth/Sex: Treating RN: 08/09/1955 (67 y.o. Tonita Phoenix, Lauren Primary Care Jaison Petraglia: Juluis Mire Other Clinician: Referring Roshni Burbano: Treating Cap Massi/Extender: Geryl Councilman Weeks in Treatment: 0 Electronic Signature(s) Signed: 08/13/2022 10:40:58 AM By: [name] RN Entered By: [name] on 07/06/2022 08:11:58 -------------------------------------------------------------------------------- Multi Wound Chart Details Patient Name: Date of Service: [name]. 07/06/2022 8:00 A M Medical Record Number: [id number] Patient Account Number: [id number] Date of Birth/Sex: Treating RN: February 06, 1956 (67 y.o. F) Primary Care Kalel Harty: Juluis Mire Other Clinician: Referring Tabor Bartram: Treating Kimsey Demaree/Extender: Edmonia Lynch in Treatment: 0 Vital Signs Height(in): Pulse(bpm): 61 Weight(lbs): Blood Pressure(mmHg): 166/99 Body Mass  Index(BMI): Temperature(F): 97.6 Respiratory Rate(breaths/min): 17 [1:Photos:] [N/A:N/A] Left Back N/A N/A Wound Location: Bump N/A N/A Wounding Event: Abscess N/A N/A Primary Etiology: Anemia, Hypertension, Type II N/A N/A Comorbid History: Diabetes, Osteoarthritis 05/25/2022 N/A N/A Date Acquired: 0 N/A N/A Weeks of Treatment: Open N/A N/A Wound Status: No N/A N/A Wound Recurrence: 2x2x0.3 N/A N/A Measurements L x W x Hale (cm) 3.142 N/A N/A A (cm) : rea 0.942 N/A N/A Volume (cm) : Full Thickness With Exposed Support N/A N/A Classification: Structures Medium N/A N/A Exudate A mount: Serosanguineous N/A N/A Exudate Type: red, brown N/A N/A Exudate Color: Distinct, outline attached N/A N/A Wound Margin: Small (1-33%) N/A N/A Granulation Amount: Red, Pink N/A N/A Granulation Quality: Large (67-100%) N/A N/A Necrotic Amount: Eschar, Adherent Slough N/A N/A Necrotic Tissue: Fat Layer (Subcutaneous Tissue): Yes N/A N/A Exposed Structures: Fascia: No Tendon: No [name], [name]) [phone].pdf Page 5 of  9 Muscle: No Joint: No Bone: No Small (1-33%) N/A N/A Epithelialization: Debridement - Excisional N/A N/A Debridement: Pre-procedure Verification/Time Out 09:05 N/A N/A Taken: Lidocaine N/A N/A Pain Control: Subcutaneous, Slough N/A N/A Tissue Debrided: Skin/Subcutaneous Tissue N/A N/A Level: 4 N/A N/A Debridement A (sq cm): rea Curette N/A N/A Instrument: Minimum N/A N/A Bleeding: Pressure N/A N/A Hemostasis A chieved: 0 N/A N/A Procedural Pain: 0 N/A N/A Post Procedural Pain: Procedure was tolerated well N/A N/A Debridement Treatment Response: 2x2x0.3 N/A N/A Post Debridement Measurements L x W x Hale (cm) 0.942 N/A N/A Post Debridement Volume: (cm) Excoriation: No N/A N/A Periwound Skin Texture: Induration: No Callus: No Crepitus: No Rash: No Scarring: No Maceration: No N/A N/A Periwound Skin Moisture: Dry/Scaly: No Atrophie Blanche: No N/A N/A Periwound Skin Color: Cyanosis: No Ecchymosis: No Erythema: No Hemosiderin Staining: No Mottled: No Pallor: No Rubor: No No Abnormality N/A N/A Temperature: Yes N/A N/A Tenderness on Palpation: Debridement N/A N/A Procedures Performed: Treatment Notes Electronic Signature(s) Signed: 07/06/2022 11:36:12 AM By: Kalman Shan DO Entered By: Kalman Shan on 07/06/2022 09:11:34 -------------------------------------------------------------------------------- Multi-Disciplinary Care Plan Details Patient Name: Date of Service: [name]. 07/06/2022 8:00 A M Medical Record Number: [id number] Patient Account Number: [id number] Date of Birth/Sex: Treating RN: [birth year] (67 y.o. Tonita Phoenix, Lauren Primary Care Rayven Rettig: Juluis Mire Other Clinician: Referring Lotus Gover: Treating Hinley Brimage/Extender: Edmonia Lynch in Treatment: 0 Active Inactive Orientation to the Wound Care Program Nursing Diagnoses: Knowledge deficit related to the wound healing center  program Goals: Patient/caregiver will verbalize understanding of the [place] Program Date Initiated: 07/06/2022 Target Resolution Date: 08/03/2022 Goal Status: Active Interventions: Provide education on orientation to the wound center [name], [name] ([id number]) [phone].pdf Page 6 of 9 Notes: Wound/Skin Impairment Nursing Diagnoses: Impaired tissue integrity Knowledge deficit related to ulceration/compromised skin integrity Goals: Patient will have a decrease in wound volume by X% from date: (specify in notes) Date Initiated: 07/06/2022 Target Resolution Date: 08/02/2022 Goal Status: Active Patient/caregiver will verbalize understanding of skin care regimen Date Initiated: 07/06/2022 Target Resolution Date: 08/03/2022 Goal Status: Active Ulcer/skin breakdown will have a volume reduction of 30% by week 4 Date Initiated: 07/06/2022 Target Resolution Date: 08/02/2022 Goal Status: Active Interventions: Assess patient/caregiver ability to obtain necessary supplies Assess patient/caregiver ability to perform ulcer/skin care regimen upon admission and as needed Assess ulceration(s) every visit Notes: Electronic Signature(s) Signed: 08/13/2022 10:40:58 AM By: [name] RN Entered By: [name] on 07/06/2022 08:35:06 -------------------------------------------------------------------------------- Pain Assessment Details Patient Name: Date of Service: [name]. 07/06/2022 8:00 A M  Medical Record Number: [id number] Patient Account Number: [id number] Date of Birth/Sex: Treating RN: 01-20-1956 (67 y.o. Tonita Phoenix, Lauren Primary Care Katheryne Gorr: Juluis Mire Other Clinician: Referring Selby Foisy: Treating Haleema Vanderheyden/Extender: Geryl Councilman Weeks in Treatment: 0 Active Problems Location of Pain Severity and Description of Pain Patient Has Paino No Site Locations Pain Management and Medication Current Pain  Management: [name], Marcia ([id number]) [phone].pdf Page 7 of 9 Electronic Signature(s) Signed: 08/13/2022 10:40:58 AM By: [name] RN Entered By: [name] on 07/06/2022 08:22:24 -------------------------------------------------------------------------------- Patient/Caregiver Education Details Patient Name: Date of Service: [name] 1/12/2024andnbsp8:00 [facility] Record Number: [id number] Patient Account Number: [id number] Date of Birth/Gender: Treating RN: September 21, 1955 (67 y.o. Tonita Phoenix, Lauren Primary Care Physician: Juluis Mire Other Clinician: Referring Physician: Treating Physician/Extender: Edmonia Lynch in Treatment: 0 Education Assessment Education Provided To: Patient Education Topics Provided Wound/Skin Impairment: Methods: Explain/Verbal Responses: Reinforcements needed, State content correctly Electronic Signature(s) Signed: 08/13/2022 10:40:58 AM By: [name] RN Entered By: [name] on 07/06/2022 08:35:14 -------------------------------------------------------------------------------- Wound Assessment Details Patient Name: Date of Service: [name]. 07/06/2022 8:00 A M Medical Record Number: [id number] Patient Account Number: [id number] Date of Birth/Sex: Treating RN: [birth year] (67 y.o. Tonita Phoenix, Lauren Primary Care Devonte Migues: Juluis Mire Other Clinician: Referring Sidhant Helderman: Treating Swannie Milius/Extender: Geryl Councilman Weeks in Treatment: 0 Wound Status Wound Number: 1 Primary Etiology: Abscess Wound Location: Left Back Wound Status: Open Wounding Event: Bump Comorbid History: Anemia, Hypertension, Type II Diabetes, Osteoarthritis Date Acquired: 05/25/2022 Weeks Of Treatment: 0 Clustered Wound: No Photos AMARELIS, SCHEIMAN ([id number]) [phone].pdf Page 8 of 9 Wound  Measurements Length: (cm) 2 Width: (cm) 2 Depth: (cm) 0.3 Area: (cm) 3.142 Volume: (cm) 0.942 % Reduction in Area: % Reduction in Volume: Epithelialization: Small (1-33%) Tunneling: No Undermining: No Wound Description Classification: Full Thickness With Exposed Suppo Wound Margin: Distinct, outline attached Exudate Amount: Medium Exudate Type: Serosanguineous Exudate Color: red, brown rt Structures Foul Odor After Cleansing: No Slough/Fibrino Yes Wound Bed Granulation Amount: Small (1-33%) Exposed Structure Granulation Quality: Red, Pink Fascia Exposed: No Necrotic Amount: Large (67-100%) Fat Layer (Subcutaneous Tissue) Exposed: Yes Necrotic Quality: Eschar, Adherent Slough Tendon Exposed: No Muscle Exposed: No Joint Exposed: No Bone Exposed: No Periwound Skin Texture Texture Color No Abnormalities Noted: No No Abnormalities Noted: No Callus: No Atrophie Blanche: No Crepitus: No Cyanosis: No Excoriation: No Ecchymosis: No Induration: No Erythema: No Rash: No Hemosiderin Staining: No Scarring: No Mottled: No Pallor: No Moisture Rubor: No No Abnormalities Noted: No Dry / Scaly: No Temperature / Pain Maceration: No Temperature: No Abnormality Tenderness on Palpation: Yes Electronic Signature(s) Signed: 08/13/2022 10:40:58 AM By: [name] RN Entered By: [name] on 07/06/2022 08:25:22 -------------------------------------------------------------------------------- Vitals Details Patient Name: Date of Service: [name]. 07/06/2022 8:00 A M Medical Record Number: [id number] Patient Account Number: [id number] Date of Birth/Sex: Treating RN: 15-Nov-1955 (67 y.o. Tonita Phoenix, Lauren Primary Care Lamoine Fredricksen: Juluis Mire Other Clinician: Referring Prescious Hurless: Treating Jamica Woodyard/Extender: Geryl Councilman Weeks in Treatment: 0 Vital Signs ALVERTA, MCGORTY Hale ([id number]) [id number] Page 9  of 9 Time Taken: 08:26 Temperature (F): 97.6 Pulse (bpm): 85 Respiratory Rate (breaths/min): 17 Blood Pressure (mmHg): 166/99 Reference Range: 80 - 120 mg / dl Electronic Signature(s) Signed: 08/13/2022 10:40:58 AM By: [name] RN Entered By: [name] on 07/06/2022 08:27:05

## 2022-08-14 ENCOUNTER — Other Ambulatory Visit: Payer: Self-pay | Admitting: Gastroenterology

## 2022-08-16 ENCOUNTER — Other Ambulatory Visit: Payer: Self-pay

## 2022-08-16 MED ORDER — METOPROLOL SUCCINATE ER 25 MG PO TB24: 25.0000 mg | ORAL_TABLET | Freq: Every day | ORAL | 1 refills | Status: DC

## 2022-08-23 ENCOUNTER — Encounter (INDEPENDENT_AMBULATORY_CARE_PROVIDER_SITE_OTHER): Payer: Medicare Other | Admitting: Primary Care

## 2022-08-27 ENCOUNTER — Ambulatory Visit (INDEPENDENT_AMBULATORY_CARE_PROVIDER_SITE_OTHER): Payer: Medicare Other | Admitting: Podiatry

## 2022-08-27 DIAGNOSIS — Z91199 Patient's noncompliance with other medical treatment and regimen due to unspecified reason: Secondary | ICD-10-CM

## 2022-08-27 NOTE — Progress Notes (Signed)
No show

## 2022-09-03 NOTE — Progress Notes (Signed)
No show

## 2022-09-11 ENCOUNTER — Ambulatory Visit: Payer: Medicare Other | Admitting: Podiatry

## 2022-09-18 ENCOUNTER — Ambulatory Visit (INDEPENDENT_AMBULATORY_CARE_PROVIDER_SITE_OTHER): Payer: Medicare Other | Admitting: Podiatry

## 2022-09-18 DIAGNOSIS — Z91199 Patient's noncompliance with other medical treatment and regimen due to unspecified reason: Secondary | ICD-10-CM

## 2022-09-18 NOTE — Progress Notes (Signed)
No show

## 2022-10-01 ENCOUNTER — Encounter: Payer: Self-pay | Admitting: Gastroenterology

## 2022-10-10 ENCOUNTER — Encounter: Payer: Self-pay | Admitting: Gastroenterology

## 2022-10-12 ENCOUNTER — Other Ambulatory Visit (INDEPENDENT_AMBULATORY_CARE_PROVIDER_SITE_OTHER): Payer: Self-pay | Admitting: Primary Care

## 2022-10-12 DIAGNOSIS — E119 Type 2 diabetes mellitus without complications: Secondary | ICD-10-CM

## 2022-11-13 ENCOUNTER — Encounter: Payer: Self-pay | Admitting: Gastroenterology

## 2022-11-13 ENCOUNTER — Ambulatory Visit (AMBULATORY_SURGERY_CENTER): Payer: Medicare Other

## 2022-11-13 VITALS — Ht 62.0 in | Wt 111.0 lb

## 2022-11-13 DIAGNOSIS — Z86010 Personal history of colonic polyps: Secondary | ICD-10-CM

## 2022-11-13 MED ORDER — NA SULFATE-K SULFATE-MG SULF 17.5-3.13-1.6 GM/177ML PO SOLN: 1.0000 | Freq: Once | ORAL | 0 refills | Status: AC

## 2022-11-13 NOTE — Progress Notes (Signed)
Pre visit completed via phone call; Patient verified name, DOB, and address;  No egg or soy allergy known to patient;  No issues known to pt with past sedation with any surgeries or procedures; Patient denies ever being told they had issues or difficulty with intubation;  No FH of Malignant Hyperthermia; Pt is not on diet pills; Pt is not on home 02;  Pt is not on blood thinners;  Pt denies issues with constipation;  No A fib or A flutter; Have any cardiac testing pending--NO Pt instructed to use [email or web address] or GoodRx for a price reduction on prep;   Insurance verified during PV appt=[employer]  Patient's chart reviewed by Cathlyn Parsons CNRA prior to previsit and patient appropriate for the [facility].  Previsit completed and red dot placed by patient's name on their procedure day (on provider's schedule).    Instructions printed and given to patient during PV appt;  GoodRx coupon given to patient to take to the pharmacy;

## 2022-11-14 ENCOUNTER — Encounter: Payer: Self-pay | Admitting: Podiatry

## 2022-11-14 ENCOUNTER — Ambulatory Visit (INDEPENDENT_AMBULATORY_CARE_PROVIDER_SITE_OTHER): Payer: Medicare Other

## 2022-11-14 ENCOUNTER — Ambulatory Visit (INDEPENDENT_AMBULATORY_CARE_PROVIDER_SITE_OTHER): Payer: Medicare Other | Admitting: Podiatry

## 2022-11-14 DIAGNOSIS — G629 Polyneuropathy, unspecified: Secondary | ICD-10-CM

## 2022-11-14 DIAGNOSIS — M79672 Pain in left foot: Secondary | ICD-10-CM | POA: Diagnosis not present

## 2022-11-14 DIAGNOSIS — M21619 Bunion of unspecified foot: Secondary | ICD-10-CM | POA: Diagnosis not present

## 2022-11-14 DIAGNOSIS — E119 Type 2 diabetes mellitus without complications: Secondary | ICD-10-CM | POA: Diagnosis not present

## 2022-11-14 DIAGNOSIS — M79671 Pain in right foot: Secondary | ICD-10-CM

## 2022-11-14 NOTE — Progress Notes (Signed)
  Subjective:  Patient ID: [name], female    DOB: 09-01-1955,   MRN: [id number]  Chief Complaint  Patient presents with   Foot Pain    Rm 21  Bilateral foot pain. Pt states she is having an increase in pain in her left 1st joint.     67 y.o. female presents for concern of increase in pain and numbness is bilateral feet.  Relates burning and tingling in their feet. Relates hallux on right is still doing well. Also hoping to have callus trimmed as well. Patient is diabetic and last A1c was  Lab Results  Component Value Date   HGBA1C 7.1 (A) 06/04/2022   .   PCP:  [name], NP    . Denies any other pedal complaints. Denies n/v/f/c.   Past Medical History:  Diagnosis Date   Anemia    hx   Aneurysm (HCC)    small, on left side of brain    Anxiety    hx of   Arthritis    generalized   Diabetes mellitus without complication (HCC)    diet control, pt denies- on medications   GERD (gastroesophageal reflux disease)    iwht certain foods/on meds/OTC PRN meds   Hyperlipidemia    on meds   Hypertension    on meds   TIA (transient ischemic attack)    pt unaware of this hx on 06/19/2017    Objective:  Physical Exam: Vascular: DP/PT pulses 2/4 bilateral. CFT <3 seconds. Absent hair growth on digits. Edema noted to bilateral lower extremities. Xerosis noted bilaterally.  Skin. No lacerations or abrasions bilateral feet. Nails 1-5 bilateral  are thickened discolored and  with subungual debris. Hyperkeratotic tissue noted left medial hallux.    Musculoskeletal: MMT 5/5 bilateral lower extremities in DF, PF, Inversion and Eversion. Deceased ROM in DF of ankle joint. 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 Tender to hallux IPJ. No pain around MPJ or lesser digits.  Neurological: Sensation intact to light touch. Protective sensation diminished bilateral.    Assessment:   1. Neuropathy   2. Bunion   3. Diabetes mellitus type 2 in nonobese [facility])       Plan:  Patient was evaluated and treated and all questions answered. Discussed neuropathy and etiology as well as treatment with patient.  Radiographs reviewed and discussed with patient. No acute fractures or dislocations. Moderate bunion deformity on right and recurrence of bunion on left with retained hardware.  -Discussed and educated patient on diabetic foot care, especially with  regards to the vascular, neurological and musculoskeletal systems.  -Stressed the importance of good glycemic control and the detriment of not  controlling glucose levels in relation to the foot. -Discussed supportive shoes at all times and checking feet regularly.  -Will check on lyrica to see if she is already taking. If not will send in refill for her to see if this helpes  -capsaicin cream discussed.  -Patient to return in 3 months for follow-up evaluation. Discussed if struggling will consider another injection vs compound cream vs qtenza vs neurology referral.     Louann Sjogren, DPM

## 2022-11-21 ENCOUNTER — Ambulatory Visit (INDEPENDENT_AMBULATORY_CARE_PROVIDER_SITE_OTHER): Payer: Medicare Other | Admitting: Primary Care

## 2022-11-26 ENCOUNTER — Ambulatory Visit: Payer: Medicare Other | Admitting: Student

## 2022-12-02 ENCOUNTER — Encounter (HOSPITAL_COMMUNITY): Payer: Self-pay

## 2022-12-02 ENCOUNTER — Other Ambulatory Visit: Payer: Self-pay

## 2022-12-02 ENCOUNTER — Telehealth: Payer: Self-pay | Admitting: Gastroenterology

## 2022-12-02 ENCOUNTER — Emergency Department (HOSPITAL_COMMUNITY): Payer: Medicare Other

## 2022-12-02 ENCOUNTER — Inpatient Hospital Stay (HOSPITAL_COMMUNITY)
Admission: EM | Admit: 2022-12-02 | Discharge: 2022-12-05 | DRG: 194 | Disposition: A | Discharge status: Home / Self Care | Payer: Medicare Other | Attending: Internal Medicine | Admitting: Internal Medicine

## 2022-12-02 DIAGNOSIS — R9431 Abnormal electrocardiogram [ECG] [EKG]: Secondary | ICD-10-CM | POA: Diagnosis present

## 2022-12-02 DIAGNOSIS — F1290 Cannabis use, unspecified, uncomplicated: Secondary | ICD-10-CM | POA: Diagnosis present

## 2022-12-02 DIAGNOSIS — J189 Pneumonia, unspecified organism: Secondary | ICD-10-CM | POA: Diagnosis not present

## 2022-12-02 DIAGNOSIS — Z981 Arthrodesis status: Secondary | ICD-10-CM

## 2022-12-02 DIAGNOSIS — E1165 Type 2 diabetes mellitus with hyperglycemia: Secondary | ICD-10-CM | POA: Diagnosis present

## 2022-12-02 DIAGNOSIS — R112 Nausea with vomiting, unspecified: Secondary | ICD-10-CM | POA: Diagnosis present

## 2022-12-02 DIAGNOSIS — Z8261 Family history of arthritis: Secondary | ICD-10-CM

## 2022-12-02 DIAGNOSIS — Z7984 Long term (current) use of oral hypoglycemic drugs: Secondary | ICD-10-CM

## 2022-12-02 DIAGNOSIS — Z8249 Family history of ischemic heart disease and other diseases of the circulatory system: Secondary | ICD-10-CM

## 2022-12-02 DIAGNOSIS — Z79899 Other long term (current) drug therapy: Secondary | ICD-10-CM

## 2022-12-02 DIAGNOSIS — E119 Type 2 diabetes mellitus without complications: Secondary | ICD-10-CM

## 2022-12-02 DIAGNOSIS — E876 Hypokalemia: Secondary | ICD-10-CM | POA: Diagnosis not present

## 2022-12-02 DIAGNOSIS — K449 Diaphragmatic hernia without obstruction or gangrene: Secondary | ICD-10-CM | POA: Diagnosis present

## 2022-12-02 DIAGNOSIS — Z833 Family history of diabetes mellitus: Secondary | ICD-10-CM

## 2022-12-02 DIAGNOSIS — E872 Acidosis, unspecified: Secondary | ICD-10-CM | POA: Diagnosis present

## 2022-12-02 DIAGNOSIS — Z87891 Personal history of nicotine dependence: Secondary | ICD-10-CM

## 2022-12-02 DIAGNOSIS — K2970 Gastritis, unspecified, without bleeding: Secondary | ICD-10-CM | POA: Diagnosis present

## 2022-12-02 DIAGNOSIS — R81 Glycosuria: Secondary | ICD-10-CM | POA: Diagnosis present

## 2022-12-02 DIAGNOSIS — Z8673 Personal history of transient ischemic attack (TIA), and cerebral infarction without residual deficits: Secondary | ICD-10-CM

## 2022-12-02 DIAGNOSIS — K219 Gastro-esophageal reflux disease without esophagitis: Secondary | ICD-10-CM | POA: Diagnosis present

## 2022-12-02 DIAGNOSIS — I10 Essential (primary) hypertension: Secondary | ICD-10-CM | POA: Diagnosis present

## 2022-12-02 DIAGNOSIS — F419 Anxiety disorder, unspecified: Secondary | ICD-10-CM | POA: Diagnosis present

## 2022-12-02 DIAGNOSIS — E785 Hyperlipidemia, unspecified: Secondary | ICD-10-CM | POA: Diagnosis present

## 2022-12-02 LAB — URINALYSIS, ROUTINE W REFLEX MICROSCOPIC
Bacteria, UA: NONE SEEN
Bilirubin Urine: NEGATIVE
Glucose, UA: >=500 mg/dL — AB
Hgb urine dipstick: NEGATIVE
Ketones, ur: 20 mg/dL — AB
Nitrite: NEGATIVE
Protein, ur: 30 mg/dL — AB
Specific Gravity, Urine: 1.013 (ref 1.005–1.030)
pH: 6 (ref 5.0–8.0)

## 2022-12-02 MED ORDER — ONDANSETRON HCL 4 MG/2ML IJ SOLN: 4.0000 mg | Freq: Once | INTRAMUSCULAR | Status: DC

## 2022-12-02 MED ORDER — LACTATED RINGERS IV BOLUS
1000.0000 mL | Freq: Once | INTRAVENOUS | Status: AC
  Administered 2022-12-03: 1000 mL via INTRAVENOUS

## 2022-12-02 MED ORDER — ONDANSETRON HCL 4 MG PO TABS: 4.0000 mg | ORAL_TABLET | Freq: Three times a day (TID) | ORAL | 0 refills | Status: DC | PRN

## 2022-12-02 NOTE — ED Provider Notes (Signed)
[place] EMERGENCY DEPARTMENT AT [facility] Provider Note   CSN: [id number] Arrival date & time: 12/02/22  2246     History {Add pertinent medical, surgical, social history, OB history to HPI:1} Chief Complaint  Patient presents with   Nausea    Arrived via ems from home after beginning BOWEL prep this afternoon for colonoscopy and having n/v/d cbg 271 vs 186/98 86 96% 18    [name] is a 67 y.o. female.  Patient with a history of hypertension and diabetes presenting with upper abdominal pain, nausea, vomiting and diarrhea onset today after starting her colonoscopy.  States she is due for colonoscopy in 2 days.  She felt fine prior to surgery later.  4-5 episodes of nonbloody nonbilious emesis.  Has had nonstop diarrhea as well but is not black or bloody.  He has diffuse crampy abdominal pain that radiates up into her chest and is worse with vomiting.  Has had chills but no documented fever.  States she has not felt this way previously with colonoscopy preps.  Previous cholecystectomy.  Still has appendix.  Denies any pain with urination or blood in the urine.  Pain radiates up into her chest but seems mostly in her abdomen.  Unable to keep anything down today and having multiple episodes of nonbloody emesis and nonbloody stools.  The history is provided by the patient.       Home Medications Prior to Admission medications   Medication Sig Start Date End Date Taking? Authorizing Provider  amLODipine (NORVASC) 10 MG tablet Take 1 tablet (10 mg total) by mouth daily. 07/17/22   [name], NP  cyclobenzaprine (FLEXERIL) 5 MG tablet Take 5 mg by mouth 3 (three) times daily as needed for muscle spasms. 11/07/18   [provider]  DULoxetine (CYMBALTA) 30 MG capsule TAKE ONE CAPSULE BY MOUTH TWICE DAILY @ 9AM & 5PM 06/23/22   [name], NP  gabapentin (NEURONTIN) 600 MG tablet Take 600 mg by mouth at bedtime. 07/11/22   [provider]   hydrOXYzine (ATARAX) 25 MG tablet Take 1 tablet (25 mg total) by mouth every 6 (six) hours. 06/16/22   Redwine, Madison A, PA-C  ibuprofen (ADVIL) 600 MG tablet Take 1 tablet (600 mg total) by mouth every 6 (six) hours as needed. 07/26/22   [name], NP  losartan-hydrochlorothiazide (HYZAAR) 100-25 MG tablet Take 1 tablet by mouth daily. 07/17/22   [name], NP  meclizine (ANTIVERT) 12.5 MG tablet Take 1 tablet (12.5 mg total) by mouth 3 (three) times daily as needed for dizziness. 11/14/21   [name], NP  methocarbamol (ROBAXIN) 500 MG tablet Take 1 tablet (500 mg total) by mouth every 8 (eight) hours as needed for muscle spasms. 06/14/22   Debby Freiberg, NP  metoprolol succinate (TOPROL-XL) 25 MG 24 hr tablet Take 1 tablet (25 mg total) by mouth daily. 08/16/22   Custovic, Rozell Searing, DO  ondansetron (ZOFRAN) 4 MG tablet Take 1 tablet (4 mg total) by mouth every 8 (eight) hours as needed for nausea or vomiting. 12/02/22   Sherrilyn Rist, MD  oxyCODONE-acetaminophen (PERCOCET/ROXICET) 5-325 MG tablet Take 1 tablet by mouth every 6 (six) hours as needed for moderate pain or severe pain. 10/17/22   [provider]  pantoprazole (PROTONIX) 40 MG tablet TAKE ONE TABLET (40mg  total) BY MOUTH DAILY AT 9AM 06/20/22   Meryl Dare, MD  potassium chloride SA (KLOR-CON M) 20 MEQ tablet  Take 20 mEq by mouth daily. 05/22/18   [provider]  pregabalin (LYRICA) 50 MG capsule Take 50 mg by mouth 3 (three) times daily. 04/30/22   [provider]  rosuvastatin (CRESTOR) 40 MG tablet Take 1 tablet (40 mg total) by mouth daily. 04/09/22   [name], NP  sitaGLIPtin (JANUVIA) 25 MG tablet Take 1 tablet (25 mg total) by mouth daily. 07/17/22   [name], NP  tiZANidine (ZANAFLEX) 2 MG tablet Take 2 mg by mouth every 6 (six) hours as needed for muscle spasms. 11/11/20   [provider]      Allergies    Patient has no known  allergies.    Review of Systems   Review of Systems  Constitutional:  Positive for activity change and appetite change. Negative for fever.  HENT:  Negative for congestion and nosebleeds.   Respiratory:  Negative for cough, chest tightness and shortness of breath.   Cardiovascular:  Positive for chest pain.  Gastrointestinal:  Positive for abdominal pain, nausea and vomiting.  Genitourinary:  Negative for dysuria and hematuria.  Musculoskeletal:  Negative for arthralgias and myalgias.  Skin:  Negative for rash.  Neurological:  Negative for dizziness, weakness and headaches.   all other systems are negative except as noted in the HPI and PMH.    Physical Exam Updated Vital Signs BP (!) 176/89 (BP Location: Left Arm)   Pulse 89   Temp 97.8 F (36.6 C) (Oral)   Resp 18   Ht 5\' 2"  (1.575 m)   Wt 51 kg   SpO2 100%   BMI 20.56 kg/m  Physical Exam Vitals and nursing note reviewed.  Constitutional:      General: She is not in acute distress.    Appearance: She is well-developed.     Comments: Actively vomiting  HENT:     Head: Normocephalic and atraumatic.     Mouth/Throat:     Pharynx: No oropharyngeal exudate.  Eyes:     Conjunctiva/sclera: Conjunctivae normal.     Pupils: Pupils are equal, round, and reactive to light.  Neck:     Comments: No meningismus. Cardiovascular:     Rate and Rhythm: Normal rate and regular rhythm.     Heart sounds: Normal heart sounds. No murmur heard. Pulmonary:     Effort: Pulmonary effort is normal. No respiratory distress.     Breath sounds: Normal breath sounds.  Chest:     Chest wall: No tenderness.  Abdominal:     Palpations: Abdomen is soft.     Tenderness: There is abdominal tenderness. There is no guarding or rebound.     Comments: Diffuse abdominal tenderness  Musculoskeletal:        General: No tenderness. Normal range of motion.     Cervical back: Normal range of motion and neck supple.  Skin:    General: Skin is warm.   Neurological:     Mental Status: She is alert and oriented to person, place, and time.     Cranial Nerves: No cranial nerve deficit.     Motor: No abnormal muscle tone.     Coordination: Coordination normal.     Comments:  5/5 strength throughout. CN 2-12 intact.Equal grip strength.   Psychiatric:        Behavior: Behavior normal.     ED Results / Procedures / Treatments   Labs (all labs ordered are listed, but only abnormal results are displayed) Labs Reviewed  CBC WITH DIFFERENTIAL/PLATELET  COMPREHENSIVE METABOLIC PANEL  LIPASE, BLOOD  URINALYSIS, ROUTINE W REFLEX MICROSCOPIC  RAPID URINE DRUG SCREEN, HOSP PERFORMED  TROPONIN I (HIGH SENSITIVITY)    EKG None  Radiology No results found.  Procedures Procedures  {Document cardiac monitor, telemetry assessment procedure when appropriate:1}  Medications Ordered in ED Medications  lactated ringers bolus 1,000 mL (has no administration in time range)  ondansetron (ZOFRAN) injection 4 mg (has no administration in time range)    ED Course/ Medical Decision Making/ A&P   {   Click here for ABCD2, HEART and other calculatorsREFRESH Note before signing :1}                          Medical Decision Making Amount and/or Complexity of Data Reviewed Labs: ordered. Decision-making details documented in ED Course. Radiology: ordered and independent interpretation performed. Decision-making details documented in ED Course. ECG/medicine tests: ordered and independent interpretation performed. Decision-making details documented in ED Course.  Risk Prescription drug management.   Nausea, vomiting, abdominal pain, chest pain after starting colonoscopy prep today.  Felt well prior to this.  On arrival she is hypertensive, vomiting but has a soft abdomen without peritoneal signs.  Will hydrate, treat symptoms, check labs, abdomen soft.  {Document critical care time when appropriate:1} {Document review of labs and clinical  decision tools ie heart score, Chads2Vasc2 etc:1}  {Document your independent review of radiology images, and any outside records:1} {Document your discussion with family members, caretakers, and with consultants:1} {Document social determinants of health affecting pt's care:1} {Document your decision making why or why not admission, treatments were needed:1} Final Clinical Impression(s) / ED Diagnoses Final diagnoses:  None    Rx / DC Orders ED Discharge Orders     None

## 2022-12-02 NOTE — Telephone Encounter (Signed)
Patient called this eve with intermittent nausea during the day prior to starting what sounds like a 2-day Miralax/Suprep for colonoscopy with you 12/04/22.  She was determined to try taking prep as ordered and had already started doing so.  I sent her a Rx for zofran 4 mg Q 8 hrs.  Please have your nurse call her in the morning 6/10 to make sure she is tolerating prep.  - H. Danis

## 2022-12-02 NOTE — ED Notes (Signed)
Patient arrived via ems from home with n/v/d after starting bowel prep for colonoscopy. Pt a/o x 4 respirations even and non labored able to walk with steady gait heavy odor of marijuana about her person

## 2022-12-03 ENCOUNTER — Emergency Department (HOSPITAL_COMMUNITY): Payer: Medicare Other

## 2022-12-03 ENCOUNTER — Telehealth: Payer: Self-pay

## 2022-12-03 DIAGNOSIS — E872 Acidosis, unspecified: Secondary | ICD-10-CM | POA: Diagnosis present

## 2022-12-03 DIAGNOSIS — R81 Glycosuria: Secondary | ICD-10-CM | POA: Diagnosis present

## 2022-12-03 DIAGNOSIS — E876 Hypokalemia: Secondary | ICD-10-CM | POA: Diagnosis present

## 2022-12-03 DIAGNOSIS — E1165 Type 2 diabetes mellitus with hyperglycemia: Secondary | ICD-10-CM | POA: Diagnosis present

## 2022-12-03 DIAGNOSIS — Z981 Arthrodesis status: Secondary | ICD-10-CM | POA: Diagnosis not present

## 2022-12-03 DIAGNOSIS — J189 Pneumonia, unspecified organism: Secondary | ICD-10-CM | POA: Diagnosis present

## 2022-12-03 DIAGNOSIS — Z8673 Personal history of transient ischemic attack (TIA), and cerebral infarction without residual deficits: Secondary | ICD-10-CM | POA: Diagnosis not present

## 2022-12-03 DIAGNOSIS — R112 Nausea with vomiting, unspecified: Principal | ICD-10-CM | POA: Diagnosis present

## 2022-12-03 DIAGNOSIS — I10 Essential (primary) hypertension: Secondary | ICD-10-CM | POA: Diagnosis present

## 2022-12-03 DIAGNOSIS — K449 Diaphragmatic hernia without obstruction or gangrene: Secondary | ICD-10-CM | POA: Diagnosis present

## 2022-12-03 DIAGNOSIS — Z87891 Personal history of nicotine dependence: Secondary | ICD-10-CM | POA: Diagnosis not present

## 2022-12-03 DIAGNOSIS — F1290 Cannabis use, unspecified, uncomplicated: Secondary | ICD-10-CM | POA: Diagnosis present

## 2022-12-03 DIAGNOSIS — K219 Gastro-esophageal reflux disease without esophagitis: Secondary | ICD-10-CM | POA: Diagnosis present

## 2022-12-03 DIAGNOSIS — Z79899 Other long term (current) drug therapy: Secondary | ICD-10-CM | POA: Diagnosis not present

## 2022-12-03 DIAGNOSIS — E785 Hyperlipidemia, unspecified: Secondary | ICD-10-CM | POA: Diagnosis present

## 2022-12-03 DIAGNOSIS — Z8261 Family history of arthritis: Secondary | ICD-10-CM | POA: Diagnosis not present

## 2022-12-03 DIAGNOSIS — Z833 Family history of diabetes mellitus: Secondary | ICD-10-CM | POA: Diagnosis not present

## 2022-12-03 DIAGNOSIS — R9431 Abnormal electrocardiogram [ECG] [EKG]: Secondary | ICD-10-CM | POA: Diagnosis present

## 2022-12-03 DIAGNOSIS — F419 Anxiety disorder, unspecified: Secondary | ICD-10-CM | POA: Diagnosis present

## 2022-12-03 DIAGNOSIS — Z7984 Long term (current) use of oral hypoglycemic drugs: Secondary | ICD-10-CM | POA: Diagnosis not present

## 2022-12-03 DIAGNOSIS — Z8249 Family history of ischemic heart disease and other diseases of the circulatory system: Secondary | ICD-10-CM | POA: Diagnosis not present

## 2022-12-03 DIAGNOSIS — K2970 Gastritis, unspecified, without bleeding: Secondary | ICD-10-CM | POA: Diagnosis present

## 2022-12-03 LAB — BASIC METABOLIC PANEL
Anion gap: 19 — ABNORMAL HIGH (ref 5–15)
Anion gap: 18 — ABNORMAL HIGH (ref 5–15)
BUN: 10 mg/dL (ref 8–23)
BUN: 9 mg/dL (ref 8–23)
CO2: 19 mmol/L — ABNORMAL LOW (ref 22–32)
CO2: 20 mmol/L — ABNORMAL LOW (ref 22–32)
Calcium: 9.6 mg/dL (ref 8.9–10.3)
Calcium: 9.5 mg/dL (ref 8.9–10.3)
Chloride: 97 mmol/L — ABNORMAL LOW (ref 98–111)
Chloride: 98 mmol/L (ref 98–111)
Creatinine, Ser: 0.86 mg/dL (ref 0.44–1.00)
Creatinine, Ser: 0.76 mg/dL (ref 0.44–1.00)
GFR, Estimated: >60 mL/min (ref >60)
GFR, Estimated: >60 mL/min (ref >60)
Glucose, Bld: 237 mg/dL — ABNORMAL HIGH (ref 70–99)
Glucose, Bld: 245 mg/dL — ABNORMAL HIGH (ref 70–99)
Potassium: 2.4 mmol/L — CL (ref 3.5–5.1)
Potassium: 3 mmol/L — ABNORMAL LOW (ref 3.5–5.1)
Sodium: 135 mmol/L (ref 135–145)
Sodium: 136 mmol/L (ref 135–145)

## 2022-12-03 LAB — BLOOD GAS, VENOUS
Acid-base deficit: 0.9 mmol/L (ref 0.0–2.0)
Bicarbonate: 22 mmol/L (ref 20.0–28.0)
Drawn by: 65756
O2 Saturation: 31.2 %
Patient temperature: 36.7
pCO2, Ven: 31 mmHg — ABNORMAL LOW (ref 44–60)
pH, Ven: 7.46 — ABNORMAL HIGH (ref 7.25–7.43)
pO2, Ven: <31 mmHg — CL (ref 32–45)

## 2022-12-03 LAB — CBC WITH DIFFERENTIAL/PLATELET
Abs Immature Granulocytes: 0.3 10*3/uL — ABNORMAL HIGH (ref 0.00–0.07)
Basophils Absolute: 0.1 10*3/uL (ref 0.0–0.1)
Basophils Relative: 1 %
Eosinophils Absolute: 0 10*3/uL (ref 0.0–0.5)
Eosinophils Relative: 0 %
HCT: 41 % (ref 36.0–46.0)
Hemoglobin: 13.6 g/dL (ref 12.0–15.0)
Immature Granulocytes: 1 %
Lymphocytes Relative: 9 %
Lymphs Abs: 2 10*3/uL (ref 0.7–4.0)
MCH: 28.1 pg (ref 26.0–34.0)
MCHC: 33.2 g/dL (ref 30.0–36.0)
MCV: 84.7 fL (ref 80.0–100.0)
Monocytes Absolute: 0.7 10*3/uL (ref 0.1–1.0)
Monocytes Relative: 3 %
Neutro Abs: 18.4 10*3/uL — ABNORMAL HIGH (ref 1.7–7.7)
Neutrophils Relative %: 86 %
Platelets: 347 10*3/uL (ref 150–400)
RBC: 4.84 MIL/uL (ref 3.87–5.11)
RDW: 13.2 % (ref 11.5–15.5)
WBC: 21.5 10*3/uL — ABNORMAL HIGH (ref 4.0–10.5)
nRBC: 0 % (ref 0.0–0.2)

## 2022-12-03 LAB — CBC
HCT: 42.1 % (ref 36.0–46.0)
Hemoglobin: 13.8 g/dL (ref 12.0–15.0)
MCH: 27.8 pg (ref 26.0–34.0)
MCHC: 32.8 g/dL (ref 30.0–36.0)
MCV: 84.7 fL (ref 80.0–100.0)
Platelets: 334 10*3/uL (ref 150–400)
RBC: 4.97 MIL/uL (ref 3.87–5.11)
RDW: 13.3 % (ref 11.5–15.5)
WBC: 20.7 10*3/uL — ABNORMAL HIGH (ref 4.0–10.5)
nRBC: 0 % (ref 0.0–0.2)

## 2022-12-03 LAB — RAPID URINE DRUG SCREEN, HOSP PERFORMED
Amphetamines: NOT DETECTED
Barbiturates: NOT DETECTED
Benzodiazepines: NOT DETECTED
Cocaine: NOT DETECTED
Opiates: NOT DETECTED
Tetrahydrocannabinol: POSITIVE — AB

## 2022-12-03 LAB — COMPREHENSIVE METABOLIC PANEL
ALT: 21 U/L (ref 0–44)
AST: 29 U/L (ref 15–41)
Albumin: 4.7 g/dL (ref 3.5–5.0)
Alkaline Phosphatase: 97 U/L (ref 38–126)
Anion gap: 17 — ABNORMAL HIGH (ref 5–15)
BUN: 12 mg/dL (ref 8–23)
CO2: 20 mmol/L — ABNORMAL LOW (ref 22–32)
Calcium: 10 mg/dL (ref 8.9–10.3)
Chloride: 100 mmol/L (ref 98–111)
Creatinine, Ser: 0.78 mg/dL (ref 0.44–1.00)
GFR, Estimated: >60 mL/min (ref >60)
Glucose, Bld: 278 mg/dL — ABNORMAL HIGH (ref 70–99)
Potassium: 2.5 mmol/L — CL (ref 3.5–5.1)
Sodium: 137 mmol/L (ref 135–145)
Total Bilirubin: 0.8 mg/dL (ref 0.3–1.2)
Total Protein: 8.4 g/dL — ABNORMAL HIGH (ref 6.5–8.1)

## 2022-12-03 LAB — GLUCOSE, CAPILLARY
Glucose-Capillary: 231 mg/dL — ABNORMAL HIGH (ref 70–99)
Glucose-Capillary: 140 mg/dL — ABNORMAL HIGH (ref 70–99)
Glucose-Capillary: 191 mg/dL — ABNORMAL HIGH (ref 70–99)

## 2022-12-03 LAB — CBG MONITORING, ED: Glucose-Capillary: 232 mg/dL — ABNORMAL HIGH (ref 70–99)

## 2022-12-03 LAB — TROPONIN I (HIGH SENSITIVITY)
Troponin I (High Sensitivity): 8 ng/L (ref <18)
Troponin I (High Sensitivity): 28 ng/L — ABNORMAL HIGH (ref <18)

## 2022-12-03 LAB — LIPASE, BLOOD: Lipase: 61 U/L — ABNORMAL HIGH (ref 11–51)

## 2022-12-03 LAB — MRSA NEXT GEN BY PCR, NASAL: MRSA by PCR Next Gen: NOT DETECTED

## 2022-12-03 LAB — HEMOGLOBIN A1C
Hgb A1c MFr Bld: 7.5 % — ABNORMAL HIGH (ref 4.8–5.6)
Mean Plasma Glucose: 168.55 mg/dL

## 2022-12-03 LAB — BETA-HYDROXYBUTYRIC ACID: Beta-Hydroxybutyric Acid: 0.2 mmol/L (ref 0.05–0.27)

## 2022-12-03 LAB — HIV ANTIBODY (ROUTINE TESTING W REFLEX): HIV Screen 4th Generation wRfx: NONREACTIVE

## 2022-12-03 LAB — MAGNESIUM: Magnesium: 1.9 mg/dL (ref 1.7–2.4)

## 2022-12-03 MED ORDER — METOPROLOL SUCCINATE ER 25 MG PO TB24
25.0000 mg | ORAL_TABLET | Freq: Every day | ORAL | Status: DC
  Administered 2022-12-03 – 2022-12-04 (×2): 25 mg via ORAL
  Filled 2022-12-03 (×2): qty 1

## 2022-12-03 MED ORDER — INSULIN ASPART 100 UNIT/ML IJ SOLN
0.0000 [IU] | INTRAMUSCULAR | Status: DC
  Administered 2022-12-03: 3 [IU] via SUBCUTANEOUS
  Administered 2022-12-03: 1 [IU] via SUBCUTANEOUS
  Administered 2022-12-03: 3 [IU] via SUBCUTANEOUS
  Administered 2022-12-03 – 2022-12-04 (×6): 2 [IU] via SUBCUTANEOUS
  Administered 2022-12-04: 1 [IU] via SUBCUTANEOUS
  Administered 2022-12-05 (×2): 2 [IU] via SUBCUTANEOUS

## 2022-12-03 MED ORDER — SODIUM CHLORIDE 0.9 % IV SOLN
100.0000 mg | Freq: Once | INTRAVENOUS | Status: AC
  Administered 2022-12-03: 100 mg via INTRAVENOUS
  Filled 2022-12-03: qty 100

## 2022-12-03 MED ORDER — POTASSIUM CHLORIDE 10 MEQ/100ML IV SOLN
INTRAVENOUS | Status: AC
  Filled 2022-12-03: qty 100

## 2022-12-03 MED ORDER — POTASSIUM CHLORIDE CRYS ER 20 MEQ PO TBCR
40.0000 meq | EXTENDED_RELEASE_TABLET | Freq: Once | ORAL | Status: DC
  Filled 2022-12-03: qty 2

## 2022-12-03 MED ORDER — POTASSIUM CHLORIDE 2 MEQ/ML IV SOLN
INTRAVENOUS | Status: AC
  Filled 2022-12-03: qty 1000

## 2022-12-03 MED ORDER — AMLODIPINE BESYLATE 10 MG PO TABS
10.0000 mg | ORAL_TABLET | Freq: Every day | ORAL | Status: DC
  Administered 2022-12-03 – 2022-12-05 (×3): 10 mg via ORAL
  Filled 2022-12-03 (×3): qty 1

## 2022-12-03 MED ORDER — POTASSIUM CHLORIDE 10 MEQ/100ML IV SOLN
10.0000 meq | INTRAVENOUS | Status: AC
  Administered 2022-12-03 (×6): 10 meq via INTRAVENOUS
  Filled 2022-12-03 (×4): qty 100

## 2022-12-03 MED ORDER — ENOXAPARIN SODIUM 40 MG/0.4ML IJ SOSY
40.0000 mg | PREFILLED_SYRINGE | INTRAMUSCULAR | Status: DC
  Administered 2022-12-03 – 2022-12-04 (×2): 40 mg via SUBCUTANEOUS
  Filled 2022-12-03 (×2): qty 0.4

## 2022-12-03 MED ORDER — POTASSIUM CHLORIDE 10 MEQ/100ML IV SOLN
10.0000 meq | INTRAVENOUS | Status: DC
  Filled 2022-12-03: qty 100

## 2022-12-03 MED ORDER — PROCHLORPERAZINE EDISYLATE 10 MG/2ML IJ SOLN
5.0000 mg | Freq: Four times a day (QID) | INTRAMUSCULAR | Status: DC | PRN
  Administered 2022-12-03 – 2022-12-04 (×2): 5 mg via INTRAVENOUS
  Filled 2022-12-03 (×2): qty 2

## 2022-12-03 MED ORDER — SODIUM CHLORIDE 0.9 % IV SOLN
100.0000 mg | Freq: Two times a day (BID) | INTRAVENOUS | Status: DC
  Filled 2022-12-03 (×2): qty 100

## 2022-12-03 MED ORDER — SODIUM CHLORIDE 0.9 % IV SOLN
100.0000 mg | Freq: Two times a day (BID) | INTRAVENOUS | Status: DC
  Administered 2022-12-03 – 2022-12-04 (×3): 100 mg via INTRAVENOUS
  Filled 2022-12-03 (×4): qty 100

## 2022-12-03 MED ORDER — ACETAMINOPHEN 325 MG PO TABS
650.0000 mg | ORAL_TABLET | Freq: Four times a day (QID) | ORAL | Status: DC | PRN
  Administered 2022-12-03: 650 mg via ORAL
  Filled 2022-12-03: qty 2

## 2022-12-03 MED ORDER — IOHEXOL 350 MG/ML SOLN
75.0000 mL | Freq: Once | INTRAVENOUS | Status: AC | PRN
  Administered 2022-12-03: 75 mL via INTRAVENOUS

## 2022-12-03 MED ORDER — OXYCODONE HCL 5 MG PO TABS: 5.0000 mg | ORAL_TABLET | Freq: Four times a day (QID) | ORAL | Status: DC | PRN

## 2022-12-03 MED ORDER — HYDRALAZINE HCL 20 MG/ML IJ SOLN
5.0000 mg | Freq: Four times a day (QID) | INTRAMUSCULAR | Status: DC | PRN
  Administered 2022-12-03: 5 mg via INTRAVENOUS
  Filled 2022-12-03: qty 1

## 2022-12-03 MED ORDER — POTASSIUM CHLORIDE 10 MEQ/100ML IV SOLN
10.0000 meq | INTRAVENOUS | Status: AC
  Administered 2022-12-03 (×3): 10 meq via INTRAVENOUS
  Filled 2022-12-03 (×3): qty 100

## 2022-12-03 MED ORDER — SODIUM CHLORIDE 0.9 % IV SOLN
1.0000 g | INTRAVENOUS | Status: DC
  Administered 2022-12-04 – 2022-12-05 (×2): 1 g via INTRAVENOUS
  Filled 2022-12-03 (×2): qty 10

## 2022-12-03 MED ORDER — HYDROMORPHONE HCL 1 MG/ML IJ SOLN: 0.5000 mg | INTRAMUSCULAR | Status: DC | PRN

## 2022-12-03 MED ORDER — HYDRALAZINE HCL 20 MG/ML IJ SOLN
10.0000 mg | INTRAMUSCULAR | Status: DC | PRN
  Administered 2022-12-04 – 2022-12-05 (×2): 10 mg via INTRAVENOUS
  Filled 2022-12-03 (×2): qty 1

## 2022-12-03 MED ORDER — LOSARTAN POTASSIUM 50 MG PO TABS
100.0000 mg | ORAL_TABLET | Freq: Every day | ORAL | Status: DC
  Administered 2022-12-03 – 2022-12-05 (×3): 100 mg via ORAL
  Filled 2022-12-03 (×3): qty 2

## 2022-12-03 MED ORDER — POTASSIUM CHLORIDE CRYS ER 20 MEQ PO TBCR
40.0000 meq | EXTENDED_RELEASE_TABLET | Freq: Two times a day (BID) | ORAL | Status: AC
  Administered 2022-12-03 (×2): 40 meq via ORAL
  Filled 2022-12-03: qty 4
  Filled 2022-12-03: qty 2

## 2022-12-03 MED ORDER — SODIUM CHLORIDE 0.9 % IV SOLN
1.0000 g | Freq: Once | INTRAVENOUS | Status: AC
  Administered 2022-12-03: 1 g via INTRAVENOUS
  Filled 2022-12-03: qty 10

## 2022-12-03 MED ORDER — POLYETHYLENE GLYCOL 3350 17 G PO PACK: 17.0000 g | PACK | Freq: Every day | ORAL | Status: DC | PRN

## 2022-12-03 MED ORDER — METOCLOPRAMIDE HCL 5 MG/ML IJ SOLN
10.0000 mg | Freq: Once | INTRAMUSCULAR | Status: AC
  Administered 2022-12-03: 10 mg via INTRAVENOUS
  Filled 2022-12-03: qty 2

## 2022-12-03 NOTE — Consult Note (Addendum)
Attending physician's note   I have taken a history, reviewed the chart, and examined the patient. I performed a substantive portion of this encounter, including complete performance of at least one of the key components, in conjunction with the APP. I agree with the APP's note, impression, and recommendations with my edits.   67 year old female with medical history as outlined below presents with nausea/vomiting.  Symptoms actually started prior to initiating her bowel preparation (scheduled for colonoscopy on 12/04/2022 with Dr. Russella Dar for routine colon polyp surveillance), and in conjunction with smoking marijuana with her sister.  Looking back through her chart, she actually has a history of nausea/vomiting, last seen in the GI clinic for this same issue on 01/04/2021.  Had EGD in 10/2020 which was notable for mild non-H. pylori gastritis and small hiatal hernia.   Symptoms improved with PPI.  Admission evaluation notable for the following: - EKG with Qtc > 620 - NA 137, K2.5, bicarb 20, anion gap 17, BG 278, mag 1.9 - Normal liver enzymes - WBC 21.5, otherwise normal CBC - Hemoglobin A1c 7.5% - Normal lipase - Troponin 8 --> 28 - X-ray: Mildly distended loops of small bowel in the upper abdomen - CT: Normal GI tract.  Possible RLL pneumonia.  Was started on Rocephin and doxycycline, along with Reglan x 1 and Compazine prn.  Today, she states her symptoms are much improved since admission.  Mild nausea, but emesis has resolved.  1) Nausea/Vomiting Discussed the potential etiologies for her nausea/vomiting, including cannabinoid hyperemesis syndrome, pneumonia, hyperglycemia.  Symptoms much improved - Continue Compazine on demand - If symptoms return, can again use Reglan, but with caution given QTc prolongation.  QTc improved on EKG today from admission - Slowly advancing diet as tolerated  2) History of colon polyps - Will plan to schedule colonoscopy as an outpatient.  No urgency  to getting this done as an inpatient, particular given possible pneumonia  3) Marijuana use - Counseled patient on marijuana and cannabinoid hyperemesis syndrome  4) Hypokalemia 5) AGMA -Management per primary Hospital service  6) Leukocytosis 7) Pneumonia - Started on Rocephin and doxycycline on admission - Management per primary Hospital service  Inpatient GI service will sign off at this time.  Will start making arrangements for outpatient follow-up with subsequent coordination of outpatient colonoscopy for ongoing surveillance.  Please do not hesitate to contact the inpatient GI service with any additional questions or concerns during her admission  Ann Held [facility] [phone] office           Consultation  Referring Provider: Dr. Sherryll Burger    Primary Care Physician:  [name], NP Primary Gastroenterologist:    Dr. Russella Dar     Reason for Consultation: Nausea and vomiting            HPI:   [name] is a 67 y.o. female with a past medical history as listed below including type 2 diabetes, unintentional weight loss of 12 pounds in the last few weeks and others, who presented to the ER on 12/02/2022 after sudden onset of nausea and vomiting after starting her bowel prep.    Today, patient explains that she actually was nauseous the day before starting her bowel prep, it started on Thursday, 11/29/2022, apparently had been staying with her sister and smoking marijuana which she normally does not do.  Describes she started with nausea and then vomiting and then tried to start her bowel prep a couple of days  later and this made all of her symptoms worse.  Currently with some generalized abdominal discomfort, but the nausea seems to be under control.  She is actually eager to try some clear liquids.  Denies previous episodes of the same.    Denies fever, chills, weight loss or blood in her stool.   ER course: Heavy odor of marijuana per the nursing staff, UDS  positive for THC, UA positive for ketonuria and glucosuria, CMP with hypokalemia potassium of 2.5, high anion gap metabolic acidosis with a bicarb of 20 and anion gap of 17, serum glucose 278, prolonged QTc EKG-given potassium replacement and IV fluid, CTAP with contrast showed possible right lower lobe pneumonia with no intra-abdominal acute pathology, started on Rocephin and doxycycline empirically, remained nauseous despite IV Reglan  GI history: 12/02/2022 patient call to our on-call service described intermittent nausea during the day prior to starting what sounds like a 2-day MiraLAX/Suprep for colonoscopy on 12/04/2022, she was sent in a prescription for Zofran 4 mg every 8 hours but then later came to the ED 01/04/2021 patient seen in clinic by Willette Cluster for chronic intermittent nausea and vomiting of unclear etiology which had resolved on a daily PPI and with dietary changes; plan at that time to continue PPI daily 10/2020 EGD with findings of mild chronic gastritis without H. Pylori 11/06/2019 colonoscopy with 3 10-15 mm polyps in the sigmoid and transverse colon, one 6 mm polyp in the transverse colon, one 7 mm polyp in the rectum, moderate diverticulosis in left colon and mild diverticulosis in the right colon, repeat recommended in 3 years  Past Medical History:  Diagnosis Date   Anemia    hx   Aneurysm (HCC)    small, on left side of brain    Anxiety    hx of   Arthritis    generalized   Diabetes mellitus without complication (HCC)    diet control, pt denies- on medications   GERD (gastroesophageal reflux disease)    iwht certain foods/on meds/OTC PRN meds   Hyperlipidemia    on meds   Hypertension    on meds   TIA (transient ischemic attack)    pt unaware of this hx on 06/19/2017    Past Surgical History:  Procedure Laterality Date   ANTERIOR CERVICAL DECOMP/DISCECTOMY FUSION     BACK SURGERY     CARPAL TUNNEL RELEASE Left 2024   CESAREAN SECTION  1986   FOOT SURGERY  Left 11/2019   IR GENERIC HISTORICAL  09/21/2014   IR ANGIO INTRA EXTRACRAN SEL INTERNAL CAROTID UNI L MOD SED 09/21/2014 Lisbeth Renshaw, MD [facility]   IR GENERIC HISTORICAL  09/21/2014   IR ANGIO INTRA EXTRACRAN SEL COM CAROTID INNOMINATE UNI R MOD SED 09/21/2014 Lisbeth Renshaw, MD [facility]   IR GENERIC HISTORICAL  09/21/2014   IR ANGIO VERTEBRAL SEL VERTEBRAL UNI L MOD SED 09/21/2014 Lisbeth Renshaw, MD [facility]   IR GENERIC HISTORICAL  09/21/2014   IR 3D INDEPENDENT WKST 09/21/2014 Lisbeth Renshaw, MD [facility]   LAPAROSCOPIC CHOLECYSTECTOMY     TUBAL LIGATION     TUBAL LIGATION      Family History  Problem Relation Age of Onset   Hypertension Mother    Arthritis Mother    Diabetes Mother    Dementia Mother    Heart attack Father    Heart disease Father    Hypertension Sister    Diabetes Sister    Hypertension Sister    Heart  disease Sister    Diabetes Sister    Hypertension Brother    Heart disease Brother    Diabetes Brother    Hypertension Brother    Hypertension Brother    Hypertension Brother    Cancer Neg Hx    Colon cancer Neg Hx    Colon polyps Neg Hx    Esophageal cancer Neg Hx    Rectal cancer Neg Hx    Stomach cancer Neg Hx    Breast cancer Neg Hx     Social History   Tobacco Use   Smoking status: Former    Types: Cigarettes   Smokeless tobacco: Never  Vaping Use   Vaping Use: Never used  Substance Use Topics   Alcohol use: Not Currently    Comment: socailly   Drug use: Yes    Types: Marijuana    Prior to Admission medications   Medication Sig Start Date End Date Taking? Authorizing Provider  amLODipine (NORVASC) 10 MG tablet Take 1 tablet (10 mg total) by mouth daily. 07/17/22  Yes Edwards, Kinnie Scales, NP  DULoxetine (CYMBALTA) 30 MG capsule TAKE ONE CAPSULE BY MOUTH TWICE DAILY @ 9AM & 5PM Patient taking differently: Take 30 mg by mouth 2 (two) times daily. 06/23/22  Yes [name], NP   losartan-hydrochlorothiazide (HYZAAR) 100-25 MG tablet Take 1 tablet by mouth daily. 07/17/22  Yes [name], NP  methocarbamol (ROBAXIN) 500 MG tablet Take 1 tablet (500 mg total) by mouth every 8 (eight) hours as needed for muscle spasms. 06/14/22  Yes Debby Freiberg, NP  metoprolol succinate (TOPROL-XL) 25 MG 24 hr tablet Take 1 tablet (25 mg total) by mouth daily. 08/16/22  Yes Custovic, Rozell Searing, DO  ondansetron (ZOFRAN) 4 MG tablet Take 1 tablet (4 mg total) by mouth every 8 (eight) hours as needed for nausea or vomiting. 12/02/22  Yes Danis, Andreas Blower, MD  oxyCODONE-acetaminophen (PERCOCET/ROXICET) 5-325 MG tablet Take 1 tablet by mouth every 6 (six) hours as needed for moderate pain or severe pain. 10/17/22  Yes [provider]  pantoprazole (PROTONIX) 40 MG tablet TAKE ONE TABLET (40mg  total) BY MOUTH DAILY AT 9AM Patient taking differently: Take 40 mg by mouth daily as needed (for heartburn). 06/20/22  Yes Meryl Dare, MD  pregabalin (LYRICA) 50 MG capsule Take 50 mg by mouth 3 (three) times daily. 04/30/22  Yes [provider]  rosuvastatin (CRESTOR) 40 MG tablet Take 1 tablet (40 mg total) by mouth daily. 04/09/22  Yes [name], NP  sitaGLIPtin (JANUVIA) 25 MG tablet Take 1 tablet (25 mg total) by mouth daily. 07/17/22  Yes [name], NP    Current Facility-Administered Medications  Medication Dose Route Frequency Provider Last Rate Last Admin   acetaminophen (TYLENOL) tablet 650 mg  650 mg Oral Q6H PRN Dow Adolph N, DO       amLODipine (NORVASC) tablet 10 mg  10 mg Oral Daily Sherryll Burger, Pratik D, DO       enoxaparin (LOVENOX) injection 40 mg  40 mg Subcutaneous Q24H Hall, Carole N, DO       hydrALAZINE (APRESOLINE) injection 10 mg  10 mg Intravenous Q4H PRN Sherryll Burger, Pratik D, DO       HYDROmorphone (DILAUDID) injection 0.5 mg  0.5 mg Intravenous Q4H PRN Dow Adolph N, DO       insulin aspart (novoLOG) injection 0-9 Units  0-9 Units  Subcutaneous Q4H Dow Adolph N, DO   3 Units at 12/03/22  0424   lactated ringers 1,000 mL with potassium chloride 40 mEq infusion   Intravenous Continuous Hall, Carole N, DO       losartan (COZAAR) tablet 100 mg  100 mg Oral Daily Sherryll Burger, Pratik D, DO       metoprolol succinate (TOPROL-XL) 24 hr tablet 25 mg  25 mg Oral Daily Shah, Pratik D, DO       oxyCODONE (Oxy IR/ROXICODONE) immediate release tablet 5 mg  5 mg Oral Q6H PRN Margo Aye, Carole N, DO       polyethylene glycol (MIRALAX / GLYCOLAX) packet 17 g  17 g Oral Daily PRN Dow Adolph N, DO       potassium chloride 10 mEq in 100 mL IVPB  10 mEq Intravenous Q1 Hr x 6 Maurilio Lovely D, DO 100 mL/hr at 12/03/22 1610 10 mEq at 12/03/22 9604   potassium chloride SA (KLOR-CON M) CR tablet 40 mEq  40 mEq Oral BID Sherryll Burger, Pratik D, DO       prochlorperazine (COMPAZINE) injection 5 mg  5 mg Intravenous Q6H PRN Darlin Drop, DO        Allergies as of 12/02/2022   (No Known Allergies)     Review of Systems:    Constitutional: No weight loss, fever or chills Skin: No rash Cardiovascular: No chest pain Respiratory: No SOB  Gastrointestinal: See HPI and otherwise negative Genitourinary: No dysuria  Neurological: No headache, dizziness or syncope Musculoskeletal: No new muscle or joint pain Hematologic: No bleeding  Psychiatric: No history of depression or anxiety    Physical Exam:  Vital signs in last 24 hours: Temp:  [97.8 F (36.6 C)-98.1 F (36.7 C)] 98.1 F (36.7 C) (06/10 0653) Pulse Rate:  [77-114] 114 (06/10 0653) Resp:  [15-32] 18 (06/10 0453) BP: (170-198)/(84-109) 187/107 (06/10 0653) SpO2:  [96 %-100 %] 100 % (06/10 0653) Weight:  [51 kg] 51 kg (06/09 2252) Last BM Date : 12/03/22 General:   Pleasant thin appearing AA female appears to be in NAD, Well developed, alert and cooperative Head:  Normocephalic and atraumatic. Eyes:   PEERL, EOMI. No icterus. Conjunctiva pink. Ears:  Normal auditory acuity. Neck:  Supple Throat:  Oral cavity and pharynx without inflammation, swelling or lesion.  Lungs: Respirations even and unlabored. Lungs clear to auscultation bilaterally.   No wheezes, crackles, or rhonchi.  Heart: Normal S1, S2. No MRG. Regular rate and rhythm. No peripheral edema, cyanosis or pallor.  Abdomen:  Soft, nondistended, mild generalized ttp, No rebound or guarding. Normal bowel sounds. No appreciable masses or hepatomegaly. Rectal:  Not performed.  Msk:  Symmetrical without gross deformities. Peripheral pulses intact.  Extremities:  Without edema, no deformity or joint abnormality.  Neurologic:  Alert and  oriented x4;  grossly normal neurologically.  Skin:   Dry and intact without significant lesions or rashes. Psychiatric: Demonstrates good judgement and reason without abnormal affect or behaviors.   LAB RESULTS: Recent Labs    12/03/22 0029 12/03/22 0355  WBC 21.5* 20.7*  HGB 13.6 13.8  HCT 41.0 42.1  PLT 347 334   BMET Recent Labs    12/03/22 0029 12/03/22 0355  NA 137 135  K 2.5* 2.4*  CL 100 97*  CO2 20* 19*  GLUCOSE 278* 237*  BUN 12 10  CREATININE 0.78 0.86  CALCIUM 10.0 9.6   LFT Recent Labs    12/03/22 0029  PROT 8.4*  ALBUMIN 4.7  AST 29  ALT 21  ALKPHOS 97  BILITOT 0.8  STUDIES: CT ABDOMEN PELVIS W CONTRAST  Result Date: 12/03/2022 CLINICAL DATA:  Nonlocalized abdominal pain, vomiting EXAM: CT ABDOMEN AND PELVIS WITH CONTRAST TECHNIQUE: Multidetector CT imaging of the abdomen and pelvis was performed using the standard protocol following bolus administration of intravenous contrast. RADIATION DOSE REDUCTION: This exam was performed according to the departmental dose-optimization program which includes automated exposure control, adjustment of the mA and/or kV according to patient size and/or use of iterative reconstruction technique. CONTRAST:  75mL OMNIPAQUE IOHEXOL 350 MG/ML SOLN COMPARISON:  10/09/2020 FINDINGS: Lower chest: Focal pulmonary infiltrate within  the medial basal segment of the right lower lobe may be infectious or inflammatory in nature. Subsegmental atelectasis within the medial right middle lobe. Extensive coronary artery calcification. Global cardiac size within normal limits. Hepatobiliary: Mild hepatic steatosis. No enhancing intrahepatic mass. Status post cholecystectomy. Mild intra and extrahepatic biliary ductal dilation likely reflects post cholecystectomy change. Pancreas: Unremarkable Spleen: Unremarkable Adrenals/Urinary Tract: The adrenal glands are unremarkable. The kidneys are normal in size and position. There is moderate right hydronephrosis and dilation of the right renal pelvis with decompression of the right ureter in keeping with a moderate right UPJ obstruction, similar to prior examination. Multiple simple cortical cysts are seen within the left kidney for which no follow-up imaging is recommended. No enhancing intrarenal mass. No perinephric fluid collection. Excreted contrast opacifies the renal collecting systems bilaterally. Bladder unremarkable. Stomach/Bowel: Severe descending and sigmoid colonic diverticulosis. The stomach, small bowel, and large bowel are otherwise unremarkable. No evidence of obstruction or focal inflammation. No free intraperitoneal gas or fluid. The appendix is normal. Vascular/Lymphatic: Aortic atherosclerosis. No enlarged abdominal or pelvic lymph nodes. Reproductive: Involuted calcified fibroids noted within the uterus. The pelvic organs are otherwise unremarkable. Other: Tiny fat containing umbilical hernia. Musculoskeletal: No acute bone abnormality. No lytic or blastic bone lesion. Degenerative changes are seen within the lumbar spine with grade 1 anterolisthesis L4-5. IMPRESSION: 1. No acute intra-abdominal pathology identified. No definite radiographic explanation for the patient's reported symptoms. 2. Focal pulmonary infiltrate within the medial basal segment of the right lower lobe, possibly  infectious or inflammatory in nature. 3. Extensive coronary artery calcification. 4. Mild hepatic steatosis. 5. Severe distal colonic diverticulosis without superimposed acute inflammatory change. 6. Aortic atherosclerosis. Aortic Atherosclerosis (ICD10-I70.0). Electronically Signed   By: Helyn Numbers M.D.   On: 12/03/2022 02:51   DG Abdomen Acute W/Chest  Result Date: 12/02/2022 CLINICAL DATA:  Abdominal pain with vomiting. EXAM: DG ABDOMEN ACUTE WITH 1 VIEW CHEST COMPARISON:  10/03/2021. FINDINGS: A nonspecific distended loop of small bowel is noted in the upper abdomen measuring 3.6 cm. No intra-abdominal free air. No radiopaque calculi. Heart size and mediastinal contours are within normal limits. Both lungs are clear. Cervical spinal fusion hardware is noted. Surgical clips are present in the right upper quadrant. Degenerative changes are present in the thoracolumbar spine. IMPRESSION: 1. Mildly distended loop of small bowel in the upper abdomen, possible ileus or obstruction. 2. No acute cardiopulmonary disease. Electronically Signed   By: Thornell Sartorius M.D.   On: 12/02/2022 23:57     Impression / Plan:   Impression: 1.  Nausea and vomiting: Started prior to starting bowel prep, had been smoking marijuana which she normally does not do; consider marijuana versus DKA versus other 2.  Abnormal CT of the chest/abdomen: As above with possible infectious or inflammatory right lower lobe pneumonia 3.  Possible developing DKA 5.  History of adenomatous polyps: Patient is due for a surveillance colonoscopy, there  is no urgency to this  Plan: 1.  At this point no plans for pursuing surveillance colonoscopy during hospitalization.  Rather would treat patient's acute issues here and hopefully her nausea and vomiting will resolve. 2.  Will plan to have the patient follow-up in our outpatient clinic in 4 to 6 weeks to ensure that all of her symptoms are better prior to scheduling her for surveillance  colonoscopy for history of polyps.  Did discuss this with the patient and she would like to wait as well.  Thank you for your kind consultation, we will likely sign off.  Violet Baldy [facility]  12/03/2022, 8:54 AM

## 2022-12-03 NOTE — Telephone Encounter (Signed)
-----   Message from Unk Lightning, [name] sent at 12/03/2022  9:43 AM EDT ----- Regarding: follow up Needs follow-up appointment with Dr. Russella Dar or Gunnar Fusi in the next 4 to 6 weeks.  This is for nausea/vomiting and history of colon polyps.  Thanks, JL L

## 2022-12-03 NOTE — Telephone Encounter (Signed)
See note from last evening.  Patient later went to ED for vomiting and was admitted.  - H. Danis

## 2022-12-03 NOTE — ED Notes (Signed)
Hospitalist at bedside 

## 2022-12-03 NOTE — Telephone Encounter (Signed)
Colonoscopy that was scheduled for tomorrow has been cancelled since pt is still admitted. No appts available within 4-6 weeks. Pt has been scheduled for a hospital follow up with Dr. Russella Dar on Monday, 02/04/23 at 10:10 am. Appt information mailed to patient.

## 2022-12-03 NOTE — Inpatient Diabetes Management (Signed)
Inpatient Diabetes Program Recommendations  AACE/ADA: New Consensus Statement on Inpatient Glycemic Control (2015)  Target Ranges:  Prepandial:   less than 140 mg/dL      Peak postprandial:   less than 180 mg/dL (1-2 hours)      Critically ill patients:  140 - 180 mg/dL   Lab Results  Component Value Date   GLUCAP 231 (H) 12/03/2022   HGBA1C 7.5 (H) 12/03/2022    Review of Glycemic Control  Diabetes history: DM2 Outpatient Diabetes medications: Janumet 50-1gm bid Current orders for Inpatient glycemic control: Novolog 0-9 units q 4 hrs.  Inpatient Diabetes Program Recommendations:   Spoke with patient @ bedside to clarify home diabetes medications. Patient continued to take the Janumet although she was not able to eat and drink and had vomiting. Reviewed with patient Janumet to be taken while eating and drinking meals. Patient agrees to check CBGs more often and report to PCP on appointments.  Thank you, Billy Fischer. Hareem Surowiec, RN, MSN, CDE  Diabetes Coordinator Inpatient Glycemic Control Team Team Pager [phone] (8am-5pm) 12/03/2022 1:51 PM

## 2022-12-03 NOTE — ED Notes (Signed)
Report called to Marissa RN. 

## 2022-12-03 NOTE — ED Notes (Signed)
ED TO INPATIENT HANDOFF REPORT  ED Nurse Name and Phone #: Aggie Cosier [phone]  S Name/Age/Gender [name] 67 y.o. female Room/Bed: 027C/027C  Code Status   Code Status: Full Code  Home/SNF/Other Home Patient oriented to: self, place, time, and situation Is this baseline? Yes   Triage Complete: Triage complete  Chief Complaint Intractable nausea and vomiting [R11.2]  Triage Note No notes on file   Allergies No Known Allergies  Level of Care/Admitting Diagnosis ED Disposition     ED Disposition  Admit   Condition  --   Comment  Hospital Area: [facility] [[id number]]  Level of Care: Telemetry Medical [104]  May admit patient to [facility] or [facility] if equivalent level of care is available:: Yes  Covid Evaluation: Asymptomatic - no recent exposure (last 10 days) testing not required  Diagnosis: Intractable nausea and vomiting [720114]  Admitting Physician: Darlin Drop [[id number]]  Attending Physician: Darlin Drop [[id number]]  Certification:: I certify this patient will need inpatient services for at least 2 midnights  Estimated Length of Stay: 2          B Medical/Surgery History Past Medical History:  Diagnosis Date   Anemia    hx   Aneurysm (HCC)    small, on left side of brain    Anxiety    hx of   Arthritis    generalized   Diabetes mellitus without complication (HCC)    diet control, pt denies- on medications   GERD (gastroesophageal reflux disease)    iwht certain foods/on meds/OTC PRN meds   Hyperlipidemia    on meds   Hypertension    on meds   TIA (transient ischemic attack)    pt unaware of this hx on 06/19/2017   Past Surgical History:  Procedure Laterality Date   ANTERIOR CERVICAL DECOMP/DISCECTOMY FUSION     BACK SURGERY     CARPAL TUNNEL RELEASE Left 2024   CESAREAN SECTION  1986   FOOT SURGERY Left 11/2019   IR GENERIC HISTORICAL  09/21/2014   IR ANGIO INTRA EXTRACRAN SEL INTERNAL CAROTID UNI L  MOD SED 09/21/2014 Lisbeth Renshaw, MD [facility]   IR GENERIC HISTORICAL  09/21/2014   IR ANGIO INTRA EXTRACRAN SEL COM CAROTID INNOMINATE UNI R MOD SED 09/21/2014 Lisbeth Renshaw, MD [facility]   IR GENERIC HISTORICAL  09/21/2014   IR ANGIO VERTEBRAL SEL VERTEBRAL UNI L MOD SED 09/21/2014 Lisbeth Renshaw, MD [facility]   IR GENERIC HISTORICAL  09/21/2014   IR 3D INDEPENDENT WKST 09/21/2014 Lisbeth Renshaw, MD [facility]   LAPAROSCOPIC CHOLECYSTECTOMY     TUBAL LIGATION     TUBAL LIGATION       A IV Location/Drains/Wounds Patient Lines/Drains/Airways Status     Active Line/Drains/Airways     Name Placement date Placement time Site Days   Peripheral IV 12/03/22 20 G Anterior;Right Forearm 12/03/22  0042  Forearm  less than 1            Intake/Output Last 24 hours  Intake/Output Summary (Last 24 hours) at 12/03/2022 0359 Last data filed at 12/03/2022 0308 Gross per 24 hour  Intake 1100 ml  Output --  Net 1100 ml    Labs/Imaging Results for orders placed or performed during the hospital encounter of 12/02/22 (from the past 48 hour(s))  Urinalysis, Routine w reflex microscopic -Urine, Clean Catch     Status: Abnormal   Collection Time: 12/02/22 11:27 PM  Result Value Ref Range  Color, Urine STRAW (A) YELLOW   APPearance CLEAR CLEAR   Specific Gravity, Urine 1.013 1.005 - 1.030   pH 6.0 5.0 - 8.0   Glucose, UA >=500 (A) NEGATIVE mg/dL   Hgb urine dipstick NEGATIVE NEGATIVE   Bilirubin Urine NEGATIVE NEGATIVE   Ketones, ur 20 (A) NEGATIVE mg/dL   Protein, ur 30 (A) NEGATIVE mg/dL   Nitrite NEGATIVE NEGATIVE   Leukocytes,Ua TRACE (A) NEGATIVE   RBC / HPF 0-5 0 - 5 RBC/hpf   WBC, UA 0-5 0 - 5 WBC/hpf   Bacteria, UA NONE SEEN NONE SEEN   Squamous Epithelial / HPF 0-5 0 - 5 /HPF   Mucus PRESENT     Comment: Performed at [facility], [address] [place], [place] [address]  Rapid urine drug screen (hospital performed)     Status: Abnormal    Collection Time: 12/02/22 11:27 PM  Result Value Ref Range   Opiates NONE DETECTED NONE DETECTED   Cocaine NONE DETECTED NONE DETECTED   Benzodiazepines NONE DETECTED NONE DETECTED   Amphetamines NONE DETECTED NONE DETECTED   Tetrahydrocannabinol POSITIVE (A) NONE DETECTED   Barbiturates NONE DETECTED NONE DETECTED    Comment: (NOTE) DRUG SCREEN FOR MEDICAL PURPOSES ONLY.  IF CONFIRMATION IS NEEDED FOR ANY PURPOSE, NOTIFY LAB WITHIN 5 DAYS.  LOWEST DETECTABLE LIMITS FOR URINE DRUG SCREEN Drug Class                     Cutoff (ng/mL) Amphetamine and metabolites    1000 Barbiturate and metabolites    200 Benzodiazepine                 200 Opiates and metabolites        300 Cocaine and metabolites        300 THC                            50 Performed at [facility], [address] [place], [place] [address]   CBC with Differential     Status: Abnormal   Collection Time: 12/03/22 12:29 AM  Result Value Ref Range   WBC 21.5 (H) 4.0 - 10.5 K/uL   RBC 4.84 3.87 - 5.11 MIL/uL   Hemoglobin 13.6 12.0 - 15.0 g/dL   HCT 09.8 11.9 - 14.7 %   MCV 84.7 80.0 - 100.0 fL   MCH 28.1 26.0 - 34.0 pg   MCHC 33.2 30.0 - 36.0 g/dL   RDW 82.9 56.2 - 13.0 %   Platelets 347 150 - 400 K/uL   nRBC 0.0 0.0 - 0.2 %   Neutrophils Relative % 86 %   Neutro Abs 18.4 (H) 1.7 - 7.7 K/uL   Lymphocytes Relative 9 %   Lymphs Abs 2.0 0.7 - 4.0 K/uL   Monocytes Relative 3 %   Monocytes Absolute 0.7 0.1 - 1.0 K/uL   Eosinophils Relative 0 %   Eosinophils Absolute 0.0 0.0 - 0.5 K/uL   Basophils Relative 1 %   Basophils Absolute 0.1 0.0 - 0.1 K/uL   Immature Granulocytes 1 %   Abs Immature Granulocytes 0.30 (H) 0.00 - 0.07 K/uL    Comment: Performed at [facility], [address], [place], [place] [address]  Comprehensive metabolic panel     Status: Abnormal   Collection Time: 12/03/22 12:29 AM  Result Value Ref Range   Sodium 137 135 - 145 mmol/L  Potassium 2.5 (LL) 3.5 - 5.1 mmol/L     Comment: CRITICAL RESULT CALLED TO, READ BACK BY AND VERIFIED WITH T. Juni Glaab, RN, [phone], 12/03/22, EADEDOKUN   Chloride 100 98 - 111 mmol/L   CO2 20 (L) 22 - 32 mmol/L   Glucose, Bld 278 (H) 70 - 99 mg/dL    Comment: Glucose reference range applies only to samples taken after fasting for at least 8 hours.   BUN 12 8 - 23 mg/dL   Creatinine, Ser 1.19 0.44 - 1.00 mg/dL   Calcium 14.7 8.9 - 82.9 mg/dL   Total Protein 8.4 (H) 6.5 - 8.1 g/dL   Albumin 4.7 3.5 - 5.0 g/dL   AST 29 15 - 41 U/L   ALT 21 0 - 44 U/L   Alkaline Phosphatase 97 38 - 126 U/L   Total Bilirubin 0.8 0.3 - 1.2 mg/dL   GFR, Estimated >56 >21 mL/min    Comment: (NOTE) Calculated using the CKD-EPI Creatinine Equation (2021)    Anion gap 17 (H) 5 - 15    Comment: Performed at [facility], [address] [place], [place] [address]  Lipase, blood     Status: Abnormal   Collection Time: 12/03/22 12:29 AM  Result Value Ref Range   Lipase 61 (H) 11 - 51 U/L    Comment: Performed at [facility], [address] [place], [place] [address]  Troponin I (High Sensitivity)     Status: None   Collection Time: 12/03/22 12:29 AM  Result Value Ref Range   Troponin I (High Sensitivity) 8 <18 ng/L    Comment: (NOTE) Elevated high sensitivity troponin I (hsTnI) values and significant  changes across serial measurements may suggest ACS but many other  chronic and acute conditions are known to elevate hsTnI results.  Refer to the "Links" section for chest pain algorithms and additional  guidance. Performed at [facility], [address], [place], [place] [address]   Magnesium     Status: None   Collection Time: 12/03/22 12:29 AM  Result Value Ref Range   Magnesium 1.9 1.7 - 2.4 mg/dL    Comment: Performed at [facility], [address] [place], [place] [address]   CT ABDOMEN PELVIS W CONTRAST  Result Date: 12/03/2022 CLINICAL DATA:  Nonlocalized abdominal pain, vomiting EXAM: CT ABDOMEN AND PELVIS WITH  CONTRAST TECHNIQUE: Multidetector CT imaging of the abdomen and pelvis was performed using the standard protocol following bolus administration of intravenous contrast. RADIATION DOSE REDUCTION: This exam was performed according to the departmental dose-optimization program which includes automated exposure control, adjustment of the mA and/or kV according to patient size and/or use of iterative reconstruction technique. CONTRAST:  75mL OMNIPAQUE IOHEXOL 350 MG/ML SOLN COMPARISON:  10/09/2020 FINDINGS: Lower chest: Focal pulmonary infiltrate within the medial basal segment of the right lower lobe may be infectious or inflammatory in nature. Subsegmental atelectasis within the medial right middle lobe. Extensive coronary artery calcification. Global cardiac size within normal limits. Hepatobiliary: Mild hepatic steatosis. No enhancing intrahepatic mass. Status post cholecystectomy. Mild intra and extrahepatic biliary ductal dilation likely reflects post cholecystectomy change. Pancreas: Unremarkable Spleen: Unremarkable Adrenals/Urinary Tract: The adrenal glands are unremarkable. The kidneys are normal in size and position. There is moderate right hydronephrosis and dilation of the right renal pelvis with decompression of the right ureter in keeping with a moderate right UPJ obstruction, similar to prior examination. Multiple simple cortical cysts are seen within the left kidney  for which no follow-up imaging is recommended. No enhancing intrarenal mass. No perinephric fluid collection. Excreted contrast opacifies the renal collecting systems bilaterally. Bladder unremarkable. Stomach/Bowel: Severe descending and sigmoid colonic diverticulosis. The stomach, small bowel, and large bowel are otherwise unremarkable. No evidence of obstruction or focal inflammation. No free intraperitoneal gas or fluid. The appendix is normal. Vascular/Lymphatic: Aortic atherosclerosis. No enlarged abdominal or pelvic lymph nodes.  Reproductive: Involuted calcified fibroids noted within the uterus. The pelvic organs are otherwise unremarkable. Other: Tiny fat containing umbilical hernia. Musculoskeletal: No acute bone abnormality. No lytic or blastic bone lesion. Degenerative changes are seen within the lumbar spine with grade 1 anterolisthesis L4-5. IMPRESSION: 1. No acute intra-abdominal pathology identified. No definite radiographic explanation for the patient's reported symptoms. 2. Focal pulmonary infiltrate within the medial basal segment of the right lower lobe, possibly infectious or inflammatory in nature. 3. Extensive coronary artery calcification. 4. Mild hepatic steatosis. 5. Severe distal colonic diverticulosis without superimposed acute inflammatory change. 6. Aortic atherosclerosis. Aortic Atherosclerosis (ICD10-I70.0). Electronically Signed   By: Helyn Numbers M.D.   On: 12/03/2022 02:51   DG Abdomen Acute W/Chest  Result Date: 12/02/2022 CLINICAL DATA:  Abdominal pain with vomiting. EXAM: DG ABDOMEN ACUTE WITH 1 VIEW CHEST COMPARISON:  10/03/2021. FINDINGS: A nonspecific distended loop of small bowel is noted in the upper abdomen measuring 3.6 cm. No intra-abdominal free air. No radiopaque calculi. Heart size and mediastinal contours are within normal limits. Both lungs are clear. Cervical spinal fusion hardware is noted. Surgical clips are present in the right upper quadrant. Degenerative changes are present in the thoracolumbar spine. IMPRESSION: 1. Mildly distended loop of small bowel in the upper abdomen, possible ileus or obstruction. 2. No acute cardiopulmonary disease. Electronically Signed   By: Thornell Sartorius M.D.   On: 12/02/2022 23:57    Pending Labs Unresulted Labs (From admission, onward)     Start     Ordered   12/10/22 0500  Creatinine, serum  (enoxaparin (LOVENOX)    CrCl >/= 30 ml/min)  Weekly,   R     Comments: while on enoxaparin therapy    12/03/22 0342   12/03/22 0500  Hemoglobin A1c  Tomorrow  morning,   R       Comments: To assess prior glycemic control    12/03/22 0347   12/03/22 0343  HIV Antibody (routine testing w rflx)  (HIV Antibody (Routine testing w reflex) panel)  Once,   R        12/03/22 0342   12/03/22 0343  CBC  (enoxaparin (LOVENOX)    CrCl >/= 30 ml/min)  Once,   R       Comments: Baseline for enoxaparin therapy IF NOT ALREADY DRAWN.  Notify MD if PLT < 100 K.    12/03/22 0342   12/03/22 0343  Creatinine, serum  (enoxaparin (LOVENOX)    CrCl >/= 30 ml/min)  Once,   R       Comments: Baseline for enoxaparin therapy IF NOT ALREADY DRAWN.    12/03/22 0342            Vitals/Pain Today's Vitals   12/03/22 0215 12/03/22 0318 12/03/22 0325 12/03/22 0345  BP: (!) 174/84 (!) 182/101  (!) 192/96  Pulse: 81 99  92  Resp: 15 19  16   Temp:   97.8 F (36.6 C)   TempSrc:   Oral   SpO2: 100% 100%  100%  Weight:      Height:  PainSc:        Isolation Precautions No active isolations  Medications Medications  potassium chloride 10 mEq in 100 mL IVPB (10 mEq Intravenous New Bag/Given 12/03/22 0310)  cefTRIAXone (ROCEPHIN) 1 g in sodium chloride 0.9 % 100 mL IVPB (has no administration in time range)  doxycycline (VIBRAMYCIN) 100 mg in sodium chloride 0.9 % 250 mL IVPB (has no administration in time range)  enoxaparin (LOVENOX) injection 40 mg (has no administration in time range)  lactated ringers 1,000 mL with potassium chloride 40 mEq infusion (has no administration in time range)  insulin aspart (novoLOG) injection 0-9 Units (has no administration in time range)  hydrALAZINE (APRESOLINE) injection 5 mg (has no administration in time range)  lactated ringers bolus 1,000 mL (0 mLs Intravenous Stopped 12/03/22 0243)  metoCLOPramide (REGLAN) injection 10 mg (10 mg Intravenous Given 12/03/22 0042)  iohexol (OMNIPAQUE) 350 MG/ML injection 75 mL (75 mLs Intravenous Contrast Given 12/03/22 0225)    Mobility walks     Focused Assessments Here for emesis  after starting bowel prep and low potassium 2.5   R Recommendations: See Admitting Provider Note  Report given to:   Additional Notes: Pt HTN  is on bag 3 of k riders needs abx as she only has 1 iv site

## 2022-12-03 NOTE — H&P (Signed)
History and Physical  [name]:[id number] DOB: 1955/08/30 DOA: 12/02/2022  Referring physician: Dr. Manus Gunning, EDP  PCP: [name], NP  Outpatient Specialists: GI Patient coming from: Home  Chief Complaint: Intractable nausea vomiting  HPI: [name] is a 67 y.o. female with medical history significant for type 2 diabetes, diagnosed 2 years ago, unintentional weight loss 12 pounds in the last few weeks to months, prior colonoscopy, who presents to [facility] ED due to sudden onset nausea vomiting after starting her bowel prep today.  States she has had a colonoscopy in the past and never had this effect from the bowel prep.  EMS was activated.  The patient was brought into the ED for further evaluation.  In the ED, on exam, weak-appearing with heavy odor of marijuana per nursing staff, UDS positive for THC.  UA positive for ketonuria and glucosuria.  Chemistry panel notable for moderate hypokalemia with serum potassium of 2.5, high anion gap metabolic acidosis with serum bicarb of 20 and anion gap of 17.  Serum glucose 278.  Twelve-lead EKG was remarkable for prolonged QTc greater than 600.    Patient was started on potassium replacement and IV fluid hydration.  CT abdomen pelvis with contrast revealed possible right lower lobe pneumonia with no intra-abdominal acute pathology.  The patient was started on Rocephin and doxycycline empirically.  Admitted by [facility], hospitalist service, to telemetry medical unit as inpatient status.  At the time of this visit the patient is still nauseous despite IV Reglan.  Has mild anion gap metabolic acidosis with concern for developing DKA.  BMP repeated and is pending.  Beta hydroxybutyrate acid also obtained and is pending.  ED Course: Temperature 97.8.  BP 170/91, pulse 98, respiratory 20, saturation 100% on room air.  Lab studies notable for serum bicarb 20, anion gap 17, serum potassium 2.5, serum glucose 278, lipase 61, WBC 21.5, neutrophil  count 18.4.  Review of Systems: Review of systems as noted in the HPI. All other systems reviewed and are negative.   Past Medical History:  Diagnosis Date   Anemia    hx   Aneurysm (HCC)    small, on left side of brain    Anxiety    hx of   Arthritis    generalized   Diabetes mellitus without complication (HCC)    diet control, pt denies- on medications   GERD (gastroesophageal reflux disease)    iwht certain foods/on meds/OTC PRN meds   Hyperlipidemia    on meds   Hypertension    on meds   TIA (transient ischemic attack)    pt unaware of this hx on 06/19/2017   Past Surgical History:  Procedure Laterality Date   ANTERIOR CERVICAL DECOMP/DISCECTOMY FUSION     BACK SURGERY     CARPAL TUNNEL RELEASE Left 2024   CESAREAN SECTION  1986   FOOT SURGERY Left 11/2019   IR GENERIC HISTORICAL  09/21/2014   IR ANGIO INTRA EXTRACRAN SEL INTERNAL CAROTID UNI L MOD SED 09/21/2014 Marcia Renshaw, MD [facility]   IR GENERIC HISTORICAL  09/21/2014   IR ANGIO INTRA EXTRACRAN SEL COM CAROTID INNOMINATE UNI R MOD SED 09/21/2014 Marcia Renshaw, MD [facility]   IR GENERIC HISTORICAL  09/21/2014   IR ANGIO VERTEBRAL SEL VERTEBRAL UNI L MOD SED 09/21/2014 Marcia Renshaw, MD [facility]   IR GENERIC HISTORICAL  09/21/2014   IR 3D INDEPENDENT WKST 09/21/2014 Marcia Renshaw, MD [facility]   LAPAROSCOPIC CHOLECYSTECTOMY  TUBAL LIGATION     TUBAL LIGATION      Social History:  reports that she has quit smoking. Her smoking use included cigarettes. She has never used smokeless tobacco. She reports that she does not currently use alcohol. She reports current drug use. Drug: Marijuana.   No Known Allergies  Family History  Problem Relation Age of Onset   Hypertension Mother    Arthritis Mother    Diabetes Mother    Dementia Mother    Heart attack Father    Heart disease Father    Hypertension Sister    Diabetes Sister    Hypertension Sister    Heart disease  Sister    Diabetes Sister    Hypertension Brother    Heart disease Brother    Diabetes Brother    Hypertension Brother    Hypertension Brother    Hypertension Brother    Cancer Neg Hx    Colon cancer Neg Hx    Colon polyps Neg Hx    Esophageal cancer Neg Hx    Rectal cancer Neg Hx    Stomach cancer Neg Hx    Breast cancer Neg Hx       Prior to Admission medications   Medication Sig Start Date End Date Taking? Authorizing Provider  amLODipine (NORVASC) 10 MG tablet Take 1 tablet (10 mg total) by mouth daily. 07/17/22  Yes Edwards, Kinnie Scales, NP  DULoxetine (CYMBALTA) 30 MG capsule TAKE ONE CAPSULE BY MOUTH TWICE DAILY @ 9AM & 5PM Patient taking differently: Take 30 mg by mouth 2 (two) times daily. 06/23/22  Yes [name], NP  losartan-hydrochlorothiazide (HYZAAR) 100-25 MG tablet Take 1 tablet by mouth daily. 07/17/22  Yes [name], NP  methocarbamol (ROBAXIN) 500 MG tablet Take 1 tablet (500 mg total) by mouth every 8 (eight) hours as needed for muscle spasms. 06/14/22  Yes Debby Freiberg, NP  metoprolol succinate (TOPROL-XL) 25 MG 24 hr tablet Take 1 tablet (25 mg total) by mouth daily. 08/16/22  Yes Custovic, Rozell Searing, DO  ondansetron (ZOFRAN) 4 MG tablet Take 1 tablet (4 mg total) by mouth every 8 (eight) hours as needed for nausea or vomiting. 12/02/22  Yes Danis, Andreas Blower, MD  oxyCODONE-acetaminophen (PERCOCET/ROXICET) 5-325 MG tablet Take 1 tablet by mouth every 6 (six) hours as needed for moderate pain or severe pain. 10/17/22  Yes [provider]  pantoprazole (PROTONIX) 40 MG tablet TAKE ONE TABLET (40mg  total) BY MOUTH DAILY AT 9AM Patient taking differently: Take 40 mg by mouth daily as needed (for heartburn). 06/20/22  Yes Meryl Dare, MD  pregabalin (LYRICA) 50 MG capsule Take 50 mg by mouth 3 (three) times daily. 04/30/22  Yes [provider]  rosuvastatin (CRESTOR) 40 MG tablet Take 1 tablet (40 mg total) by mouth daily.  04/09/22  Yes [name], NP  sitaGLIPtin (JANUVIA) 25 MG tablet Take 1 tablet (25 mg total) by mouth daily. 07/17/22  Yes [name], NP    Physical Exam: BP (!) 170/91   Pulse 99   Temp 97.8 F (36.6 C) (Oral)   Resp 20   Ht 5\' 2"  (1.575 m)   Wt 51 kg   SpO2 100%   BMI 20.56 kg/m   General: 67 y.o. year-old female frail-appearing in no acute distress.  Alert and oriented x3. Cardiovascular: Regular rate and rhythm with no rubs or gallops.  No thyromegaly or JVD noted.  No lower extremity edema. 2/4 pulses  in all 4 extremities. Respiratory: Clear to auscultation with no wheezes or rales. Good inspiratory effort. Abdomen: Soft diffusely tenderness with palpation, nondistended with normal bowel sounds x4 quadrants. Muskuloskeletal: No cyanosis, clubbing or edema noted bilaterally Neuro: CN II-XII intact, strength, sensation, reflexes Skin: No ulcerative lesions noted or rashes Psychiatry: Judgement and insight appear normal. Mood is appropriate for condition and setting          Labs on Admission:  Basic Metabolic Panel: Recent Labs  Lab 12/03/22 0029  NA 137  K 2.5*  CL 100  CO2 20*  GLUCOSE 278*  BUN 12  CREATININE 0.78  CALCIUM 10.0  MG 1.9   Liver Function Tests: Recent Labs  Lab 12/03/22 0029  AST 29  ALT 21  ALKPHOS 97  BILITOT 0.8  PROT 8.4*  ALBUMIN 4.7   Recent Labs  Lab 12/03/22 0029  LIPASE 61*   No results for input(s): "AMMONIA" in the last 168 hours. CBC: Recent Labs  Lab 12/03/22 0029  WBC 21.5*  NEUTROABS 18.4*  HGB 13.6  HCT 41.0  MCV 84.7  PLT 347   Cardiac Enzymes: No results for input(s): "CKTOTAL", "CKMB", "CKMBINDEX", "TROPONINI" in the last 168 hours.  BNP (last 3 results) No results for input(s): "BNP" in the last 8760 hours.  ProBNP (last 3 results) No results for input(s): "PROBNP" in the last 8760 hours.  CBG: Recent Labs  Lab 12/03/22 0415  GLUCAP 232*    Radiological Exams on  Admission: CT ABDOMEN PELVIS W CONTRAST  Result Date: 12/03/2022 CLINICAL DATA:  Nonlocalized abdominal pain, vomiting EXAM: CT ABDOMEN AND PELVIS WITH CONTRAST TECHNIQUE: Multidetector CT imaging of the abdomen and pelvis was performed using the standard protocol following bolus administration of intravenous contrast. RADIATION DOSE REDUCTION: This exam was performed according to the departmental dose-optimization program which includes automated exposure control, adjustment of the mA and/or kV according to patient size and/or use of iterative reconstruction technique. CONTRAST:  75mL OMNIPAQUE IOHEXOL 350 MG/ML SOLN COMPARISON:  10/09/2020 FINDINGS: Lower chest: Focal pulmonary infiltrate within the medial basal segment of the right lower lobe may be infectious or inflammatory in nature. Subsegmental atelectasis within the medial right middle lobe. Extensive coronary artery calcification. Global cardiac size within normal limits. Hepatobiliary: Mild hepatic steatosis. No enhancing intrahepatic mass. Status post cholecystectomy. Mild intra and extrahepatic biliary ductal dilation likely reflects post cholecystectomy change. Pancreas: Unremarkable Spleen: Unremarkable Adrenals/Urinary Tract: The adrenal glands are unremarkable. The kidneys are normal in size and position. There is moderate right hydronephrosis and dilation of the right renal pelvis with decompression of the right ureter in keeping with a moderate right UPJ obstruction, similar to prior examination. Multiple simple cortical cysts are seen within the left kidney for which no follow-up imaging is recommended. No enhancing intrarenal mass. No perinephric fluid collection. Excreted contrast opacifies the renal collecting systems bilaterally. Bladder unremarkable. Stomach/Bowel: Severe descending and sigmoid colonic diverticulosis. The stomach, small bowel, and large bowel are otherwise unremarkable. No evidence of obstruction or focal inflammation. No  free intraperitoneal gas or fluid. The appendix is normal. Vascular/Lymphatic: Aortic atherosclerosis. No enlarged abdominal or pelvic lymph nodes. Reproductive: Involuted calcified fibroids noted within the uterus. The pelvic organs are otherwise unremarkable. Other: Tiny fat containing umbilical hernia. Musculoskeletal: No acute bone abnormality. No lytic or blastic bone lesion. Degenerative changes are seen within the lumbar spine with grade 1 anterolisthesis L4-5. IMPRESSION: 1. No acute intra-abdominal pathology identified. No definite radiographic explanation for the patient's reported symptoms. 2. Focal  pulmonary infiltrate within the medial basal segment of the right lower lobe, possibly infectious or inflammatory in nature. 3. Extensive coronary artery calcification. 4. Mild hepatic steatosis. 5. Severe distal colonic diverticulosis without superimposed acute inflammatory change. 6. Aortic atherosclerosis. Aortic Atherosclerosis (ICD10-I70.0). Electronically Signed   By: Helyn Numbers M.D.   On: 12/03/2022 02:51   DG Abdomen Acute W/Chest  Result Date: 12/02/2022 CLINICAL DATA:  Abdominal pain with vomiting. EXAM: DG ABDOMEN ACUTE WITH 1 VIEW CHEST COMPARISON:  10/03/2021. FINDINGS: A nonspecific distended loop of small bowel is noted in the upper abdomen measuring 3.6 cm. No intra-abdominal free air. No radiopaque calculi. Heart size and mediastinal contours are within normal limits. Both lungs are clear. Cervical spinal fusion hardware is noted. Surgical clips are present in the right upper quadrant. Degenerative changes are present in the thoracolumbar spine. IMPRESSION: 1. Mildly distended loop of small bowel in the upper abdomen, possible ileus or obstruction. 2. No acute cardiopulmonary disease. Electronically Signed   By: Thornell Sartorius M.D.   On: 12/02/2022 23:57    EKG: I independently viewed the EKG done and my findings are as followed: Sinus rhythm rate of 69.  Nonspecific ST-T changes.  QTc  624.  Assessment/Plan Present on Admission:  Intractable nausea and vomiting  Principal Problem:   Intractable nausea and vomiting  Intractable nausea and vomiting, unclear etiology Rule out DKA, at the time of this visit does not meet all the criteria for DKA Awaiting repeat BMP and VBG to check for acidosis, to meet criteria serum bicarb will need to be less than 18 or pH less than 7.3.  Missed the other criteria with blood glucose greater than 250, and ketonuria, beta hydroxybutyrate acid is pending. Continue supportive care IV fluid hydration LR KCl 40 mill equivalent at 100 cc/h x 1 day IV antiemetics as needed Clear liquid diet, advance as tolerated. [facility] consulted.  QTc prolongation Presented with hypokalemia and QTc greater than 620 Optimize potassium level greater than 4.0 Optimize magnesium level greater than 2.0 Monitor on telemetry  Type 2 diabetes, diagnosed 2 years ago, with hyperglycemia and mild anion gap metabolic acidosis Type 1 diabetes was not ruled out at initial diagnosis, will change her management if she is in fact a type I diabetic C-peptide, islet cell, and glutamic acid decarboxylase antibody titers ordered to rule out type I diabetes Repeat BMP and follow-up beta hydroxybutyrate acid  Moderate hypokalemia Serum potassium 2.5 Repleted intravenously Magnesium 1.9 Repeat BMP  Unintentional weight loss, unclear etiology Reports 12 pound weight loss in the past few weeks to months Unclear if it is related to her diabetes  THC use UDS positive for THC If DKA is ruled out, consider THC use as a risk factor for intractable nausea and vomiting.   DVT prophylaxis: Subcu Lovenox daily  Code Status: Full code  Family Communication: None at bedside.  Disposition Plan: Admitted to telemetry medical unit  Consults called: GI.  Admission status: Inpatient status.   Status is: Inpatient The patient requires at least 2 midnights for further  evaluation and treatment of present condition.   Darlin Drop MD [facility] Pager [phone]  If 7PM-7AM, please contact night-coverage [email or web address] Password [facility]  12/03/2022, 4:29 AM

## 2022-12-03 NOTE — Progress Notes (Signed)
[name] is a 67 y.o. female with medical history significant for type 2 diabetes, diagnosed 2 years ago, unintentional weight loss 12 pounds in the last few weeks to months, prior colonoscopy, who presents to [facility] ED due to sudden onset nausea vomiting after starting her bowel prep today.   She was admitted for intractable nausea and vomiting with associated moderate hypokalemia.  She is also thought to have some possible right lower lobe pneumonia and has been started on doxycycline.  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  She is currently not significantly acidemic and states that her symptoms are slowly improving.  GI evaluation with recommendations to follow up with colonoscopy outpatient.  Patient seen and evaluated at bedside and has been admitted after midnight.  Total care time: 25 minutes.

## 2022-12-03 NOTE — ED Notes (Signed)
Ambulates to restroom without assistance or assistive device

## 2022-12-04 ENCOUNTER — Encounter: Payer: Medicare Other | Admitting: Gastroenterology

## 2022-12-04 DIAGNOSIS — R112 Nausea with vomiting, unspecified: Secondary | ICD-10-CM | POA: Diagnosis not present

## 2022-12-04 LAB — BASIC METABOLIC PANEL
Anion gap: 15 (ref 5–15)
BUN: 9 mg/dL (ref 8–23)
CO2: 21 mmol/L — ABNORMAL LOW (ref 22–32)
Calcium: 9.3 mg/dL (ref 8.9–10.3)
Chloride: 103 mmol/L (ref 98–111)
Creatinine, Ser: 0.64 mg/dL (ref 0.44–1.00)
GFR, Estimated: >60 mL/min (ref >60)
Glucose, Bld: 177 mg/dL — ABNORMAL HIGH (ref 70–99)
Potassium: 3.5 mmol/L (ref 3.5–5.1)
Sodium: 139 mmol/L (ref 135–145)

## 2022-12-04 LAB — GLUCOSE, CAPILLARY
Glucose-Capillary: 143 mg/dL — ABNORMAL HIGH (ref 70–99)
Glucose-Capillary: 157 mg/dL — ABNORMAL HIGH (ref 70–99)
Glucose-Capillary: 168 mg/dL — ABNORMAL HIGH (ref 70–99)
Glucose-Capillary: 170 mg/dL — ABNORMAL HIGH (ref 70–99)
Glucose-Capillary: 172 mg/dL — ABNORMAL HIGH (ref 70–99)
Glucose-Capillary: 178 mg/dL — ABNORMAL HIGH (ref 70–99)
Glucose-Capillary: 180 mg/dL — ABNORMAL HIGH (ref 70–99)

## 2022-12-04 LAB — CBC
HCT: 39.9 % (ref 36.0–46.0)
Hemoglobin: 13.2 g/dL (ref 12.0–15.0)
MCH: 26.9 pg (ref 26.0–34.0)
MCHC: 33.1 g/dL (ref 30.0–36.0)
MCV: 81.4 fL (ref 80.0–100.0)
Platelets: 318 10*3/uL (ref 150–400)
RBC: 4.9 MIL/uL (ref 3.87–5.11)
RDW: 13.9 % (ref 11.5–15.5)
WBC: 16.9 10*3/uL — ABNORMAL HIGH (ref 4.0–10.5)
nRBC: 0 % (ref 0.0–0.2)

## 2022-12-04 LAB — MAGNESIUM: Magnesium: 1.7 mg/dL (ref 1.7–2.4)

## 2022-12-04 LAB — ANTI-ISLET CELL ANTIBODY: Pancreatic Islet Cell Antibody: NEGATIVE

## 2022-12-04 MED ORDER — METOPROLOL SUCCINATE ER 50 MG PO TB24
50.0000 mg | ORAL_TABLET | Freq: Every day | ORAL | Status: DC
  Administered 2022-12-05: 50 mg via ORAL
  Filled 2022-12-04: qty 1

## 2022-12-04 MED ORDER — TRIMETHOBENZAMIDE HCL 100 MG/ML IM SOLN
200.0000 mg | Freq: Three times a day (TID) | INTRAMUSCULAR | Status: DC | PRN
  Administered 2022-12-04 (×2): 200 mg via INTRAMUSCULAR
  Filled 2022-12-04 (×3): qty 2

## 2022-12-04 NOTE — Progress Notes (Addendum)
PROGRESS NOTE  DARCELLA WEYLAND  ZOX:[id number] DOB: 1955/11/03 DOA: 12/02/2022 PCP: [name], NP   Brief Narrative: Patient is a 67 year old female with history of type 2 diabetes who presents with complaint of nausea, vomiting after having a bowel prep for colonoscopy.  History of marijuana intake.  UDS positive for THC.  UA positive for ketones and glucosuria.  Lab work showed potassium of 2.5.  EKG showed QTc of more than 600.  Patient was started on IV fluids.  CT abdomen/pelvis showed possible right lower lobe pneumonia without any intra-abdominal pathology, started on antibiotics.  Nausea getting better today.  Diet advanced to soft.  Possible discharge tomorrow to home  Assessment & Plan:  Principal Problem:   Intractable nausea and vomiting Active Problems:   Prolonged QT interval  Intractable nausea/vomiting: Started having nausea and vomiting after being bowel prep for colonoscopy.  Cannabinoid hyperemesis syndrome could play a role here too.  UDS positive for marijuana.  Continue antiemetics, IV fluids. Normal liver enzymes.  X-ray showed mildly distended loops of small bowel in the upper abdomen.  GI was consulted, now signed off.  GI will reschedule the colonoscopy as an outpatient. Will advance the diet as tolerated.Ordered soft diet  Possible community-acquired pneumonia:CT imaging showed possible focal infiltrate on right lower lobe.  Continue current antibiotics.  Leukocytosis improving, currently afebrile.  Denies any shortness of breath or cough  Prolonged QTc: Continue to monitor on telemetry.  Diabetes mellitus: Recently diagnosed.  Likely type 2 diabetes.  Glutamic acid decarboxylase antibodies sent.  Currently on sliding scale.  Monitor blood sugars.  A1c of 7.5.  Takes Januvia at home  Marijuana use: Counseled cessation.  This could be triggering the nausea/vomiting.  Hypokalemia: Currently being supplemented and monitored  History of hypertension:  Currently on amlodipine and losartan.  Continue as needed medications for severe hypertension        DVT prophylaxis:enoxaparin (LOVENOX) injection 40 mg Start: 12/03/22 0345     Code Status: Full Code  Family Communication: None at the bedside  Patient status:Inpatient  Patient is from :Home  Anticipated discharge WJ:XBJY  Estimated DC date:tomorrow   Consultants: None  Procedures:None  Antimicrobials:  Anti-infectives (From admission, onward)    Start     Dose/Rate Route Frequency Ordered Stop   12/04/22 0600  cefTRIAXone (ROCEPHIN) 1 g in sodium chloride 0.9 % 100 mL IVPB        1 g 200 mL/hr over 30 Minutes Intravenous Every 24 hours 12/03/22 1358 12/08/22 0559   12/03/22 2100  doxycycline (VIBRAMYCIN) 100 mg in sodium chloride 0.9 % 250 mL IVPB        100 mg 125 mL/hr over 120 Minutes Intravenous Every 12 hours 12/03/22 2036 12/07/22 2129   12/03/22 1800  doxycycline (VIBRAMYCIN) 100 mg in sodium chloride 0.9 % 250 mL IVPB  Status:  Discontinued        100 mg 125 mL/hr over 120 Minutes Intravenous Every 12 hours 12/03/22 1358 12/03/22 2036   12/03/22 0330  cefTRIAXone (ROCEPHIN) 1 g in sodium chloride 0.9 % 100 mL IVPB        1 g 200 mL/hr over 30 Minutes Intravenous  Once 12/03/22 0316 12/03/22 0554   12/03/22 0330  doxycycline (VIBRAMYCIN) 100 mg in sodium chloride 0.9 % 250 mL IVPB        100 mg 125 mL/hr over 120 Minutes Intravenous  Once 12/03/22 0316 12/03/22 0841       Subjective: Patient seen and  examined at bedside today.  Feels better.  Nausea has improved.  She has not vomited this morning.  She wants to advance the diet to soft.  Denies shortness of breath or cough.  Objective: Vitals:   12/03/22 1522 12/03/22 1817 12/03/22 2054 12/04/22 0525  BP: (!) 148/87 (!) 141/86 (!) 180/96 (!) 177/90  Pulse: 93 96 (!) 110   Resp: 18 18 16 16   Temp: 98.2 F (36.8 C) 98.4 F (36.9 C) 98.7 F (37.1 C) 98 F (36.7 C)  TempSrc:   Oral Oral  SpO2: 100%  100% 100% 100%  Weight:      Height:       No intake or output data in the 24 hours ending 12/04/22 0930 Filed Weights   12/02/22 2252  Weight: 51 kg    Examination:  General exam: Overall comfortable, not in distress HEENT: PERRL Respiratory system:  no wheezes or crackles  Cardiovascular system: S1 & S2 heard, RRR.  Gastrointestinal system: Abdomen is nondistended, soft and nontender. Central nervous system: Alert and oriented Extremities: No edema, no clubbing ,no cyanosis Skin: No rashes, no ulcers,no icterus     Data Reviewed: I have personally reviewed following labs and imaging studies  CBC: Recent Labs  Lab 12/03/22 0029 12/03/22 0355 12/04/22 0700  WBC 21.5* 20.7* 16.9*  NEUTROABS 18.4*  --   --   HGB 13.6 13.8 13.2  HCT 41.0 42.1 39.9  MCV 84.7 84.7 81.4  PLT 347 334 318   Basic Metabolic Panel: Recent Labs  Lab 12/03/22 0029 12/03/22 0355 12/03/22 0958 12/04/22 0700  NA 137 135 136 139  K 2.5* 2.4* 3.0* 3.5  CL 100 97* 98 103  CO2 20* 19* 20* 21*  GLUCOSE 278* 237* 245* 177*  BUN 12 10 9 9   CREATININE 0.78 0.86 0.76 0.64  CALCIUM 10.0 9.6 9.5 9.3  MG 1.9  --   --  1.7     Recent Results (from the past 240 hour(s))  MRSA Next Gen by PCR, Nasal     Status: None   Collection Time: 12/03/22 11:21 AM   Specimen: Nasal Mucosa; Nasal Swab  Result Value Ref Range Status   MRSA by PCR Next Gen NOT DETECTED NOT DETECTED Final    Comment: (NOTE) The GeneXpert MRSA Assay (FDA approved for NASAL specimens only), is one component of a comprehensive MRSA colonization surveillance program. It is not intended to diagnose MRSA infection nor to guide or monitor treatment for MRSA infections. Test performance is not FDA approved in patients less than 5 years old. Performed at [facility], [address] [place], [place] [address]      Radiology Studies: CT ABDOMEN PELVIS W CONTRAST  Result Date: 12/03/2022 CLINICAL DATA:  Nonlocalized  abdominal pain, vomiting EXAM: CT ABDOMEN AND PELVIS WITH CONTRAST TECHNIQUE: Multidetector CT imaging of the abdomen and pelvis was performed using the standard protocol following bolus administration of intravenous contrast. RADIATION DOSE REDUCTION: This exam was performed according to the departmental dose-optimization program which includes automated exposure control, adjustment of the mA and/or kV according to patient size and/or use of iterative reconstruction technique. CONTRAST:  75mL OMNIPAQUE IOHEXOL 350 MG/ML SOLN COMPARISON:  10/09/2020 FINDINGS: Lower chest: Focal pulmonary infiltrate within the medial basal segment of the right lower lobe may be infectious or inflammatory in nature. Subsegmental atelectasis within the medial right middle lobe. Extensive coronary artery calcification. Global cardiac size within normal limits. Hepatobiliary: Mild hepatic steatosis. No enhancing intrahepatic mass.  Status post cholecystectomy. Mild intra and extrahepatic biliary ductal dilation likely reflects post cholecystectomy change. Pancreas: Unremarkable Spleen: Unremarkable Adrenals/Urinary Tract: The adrenal glands are unremarkable. The kidneys are normal in size and position. There is moderate right hydronephrosis and dilation of the right renal pelvis with decompression of the right ureter in keeping with a moderate right UPJ obstruction, similar to prior examination. Multiple simple cortical cysts are seen within the left kidney for which no follow-up imaging is recommended. No enhancing intrarenal mass. No perinephric fluid collection. Excreted contrast opacifies the renal collecting systems bilaterally. Bladder unremarkable. Stomach/Bowel: Severe descending and sigmoid colonic diverticulosis. The stomach, small bowel, and large bowel are otherwise unremarkable. No evidence of obstruction or focal inflammation. No free intraperitoneal gas or fluid. The appendix is normal. Vascular/Lymphatic: Aortic  atherosclerosis. No enlarged abdominal or pelvic lymph nodes. Reproductive: Involuted calcified fibroids noted within the uterus. The pelvic organs are otherwise unremarkable. Other: Tiny fat containing umbilical hernia. Musculoskeletal: No acute bone abnormality. No lytic or blastic bone lesion. Degenerative changes are seen within the lumbar spine with grade 1 anterolisthesis L4-5. IMPRESSION: 1. No acute intra-abdominal pathology identified. No definite radiographic explanation for the patient's reported symptoms. 2. Focal pulmonary infiltrate within the medial basal segment of the right lower lobe, possibly infectious or inflammatory in nature. 3. Extensive coronary artery calcification. 4. Mild hepatic steatosis. 5. Severe distal colonic diverticulosis without superimposed acute inflammatory change. 6. Aortic atherosclerosis. Aortic Atherosclerosis (ICD10-I70.0). Electronically Signed   By: Helyn Numbers M.D.   On: 12/03/2022 02:51   DG Abdomen Acute W/Chest  Result Date: 12/02/2022 CLINICAL DATA:  Abdominal pain with vomiting. EXAM: DG ABDOMEN ACUTE WITH 1 VIEW CHEST COMPARISON:  10/03/2021. FINDINGS: A nonspecific distended loop of small bowel is noted in the upper abdomen measuring 3.6 cm. No intra-abdominal free air. No radiopaque calculi. Heart size and mediastinal contours are within normal limits. Both lungs are clear. Cervical spinal fusion hardware is noted. Surgical clips are present in the right upper quadrant. Degenerative changes are present in the thoracolumbar spine. IMPRESSION: 1. Mildly distended loop of small bowel in the upper abdomen, possible ileus or obstruction. 2. No acute cardiopulmonary disease. Electronically Signed   By: Thornell Sartorius M.D.   On: 12/02/2022 23:57    Scheduled Meds:  amLODipine  10 mg Oral Daily   enoxaparin (LOVENOX) injection  40 mg Subcutaneous Q24H   insulin aspart  0-9 Units Subcutaneous Q4H   losartan  100 mg Oral Daily   metoprolol succinate  25 mg  Oral Daily   Continuous Infusions:  cefTRIAXone (ROCEPHIN)  IV 1 g (12/04/22 1610)   doxycycline (VIBRAMYCIN) IV 100 mg (12/04/22 0821)     LOS: 1 day   Burnadette Pop, MD [facility] P6/04/2023, 9:30 AM

## 2022-12-04 NOTE — Progress Notes (Addendum)
Transition of Care [facility]) - Inpatient Brief Assessment   Patient Details  Name: [name] MRN: [id number] Date of Birth: 1956/05/31  [facility]) CM/SW Contact:    Janae Bridgeman, RN Phone Number: 12/04/2022, 3:57 PM   Clinical Narrative: No TOC needs determined at this time.   CM will continue to follow the patient for discharge needs as the patient progresses.  History of mariguana use noted - OP counseling resources provided in the AVS.   Transition of Care Asessment: Insurance and Status: (P) Insurance coverage has been reviewed Patient has primary care physician: (P) Yes Home environment has been reviewed: (P) Yes Prior level of function:: (P) Independent Prior/Current Home Services: (P) No current home services Social Determinants of Health Reivew: (P) SDOH reviewed no interventions necessary Readmission risk has been reviewed: (P) Yes Transition of care needs: (P) no transition of care needs at this time

## 2022-12-05 ENCOUNTER — Ambulatory Visit (INDEPENDENT_AMBULATORY_CARE_PROVIDER_SITE_OTHER): Payer: Medicare Other | Admitting: Primary Care

## 2022-12-05 ENCOUNTER — Telehealth (INDEPENDENT_AMBULATORY_CARE_PROVIDER_SITE_OTHER): Payer: Self-pay

## 2022-12-05 DIAGNOSIS — R112 Nausea with vomiting, unspecified: Secondary | ICD-10-CM | POA: Diagnosis not present

## 2022-12-05 LAB — CBC
HCT: 39.6 % (ref 36.0–46.0)
Hemoglobin: 13.2 g/dL (ref 12.0–15.0)
MCH: 27 pg (ref 26.0–34.0)
MCHC: 33.3 g/dL (ref 30.0–36.0)
MCV: 81.1 fL (ref 80.0–100.0)
Platelets: 335 10*3/uL (ref 150–400)
RBC: 4.88 MIL/uL (ref 3.87–5.11)
RDW: 13.8 % (ref 11.5–15.5)
WBC: 16.6 10*3/uL — ABNORMAL HIGH (ref 4.0–10.5)
nRBC: 0 % (ref 0.0–0.2)

## 2022-12-05 LAB — GLUCOSE, CAPILLARY
Glucose-Capillary: 134 mg/dL — ABNORMAL HIGH (ref 70–99)
Glucose-Capillary: 160 mg/dL — ABNORMAL HIGH (ref 70–99)
Glucose-Capillary: 161 mg/dL — ABNORMAL HIGH (ref 70–99)
Glucose-Capillary: 176 mg/dL — ABNORMAL HIGH (ref 70–99)

## 2022-12-05 LAB — C-PEPTIDE: C-Peptide: 3.1 ng/mL (ref 1.1–4.4)

## 2022-12-05 LAB — GLUTAMIC ACID DECARBOXYLASE AUTO ABS: Glutamic Acid Decarb Ab: <5 U/mL (ref 0.0–5.0)

## 2022-12-05 MED ORDER — POTASSIUM CHLORIDE CRYS ER 20 MEQ PO TBCR: 40.0000 meq | EXTENDED_RELEASE_TABLET | Freq: Every day | ORAL | 0 refills | Status: DC

## 2022-12-05 MED ORDER — AMOXICILLIN-POT CLAVULANATE 875-125 MG PO TABS: 1.0000 | ORAL_TABLET | Freq: Two times a day (BID) | ORAL | 0 refills | Status: AC

## 2022-12-05 MED ORDER — MELATONIN 3 MG PO TABS
3.0000 mg | ORAL_TABLET | Freq: Once | ORAL | Status: AC | PRN
  Administered 2022-12-05: 3 mg via ORAL
  Filled 2022-12-05: qty 1

## 2022-12-05 MED ORDER — AMOXICILLIN-POT CLAVULANATE 875-125 MG PO TABS: 1.0000 | ORAL_TABLET | Freq: Two times a day (BID) | ORAL | Status: DC

## 2022-12-05 MED ORDER — DOXYCYCLINE HYCLATE 100 MG PO TABS
100.0000 mg | ORAL_TABLET | Freq: Two times a day (BID) | ORAL | Status: DC
  Administered 2022-12-05: 100 mg via ORAL
  Filled 2022-12-05: qty 1

## 2022-12-05 NOTE — Discharge Summary (Signed)
Physician Discharge Summary  [name]:[id number] DOB: Feb 11, 1956 DOA: 12/02/2022  PCP: [name], NP  Admit date: 12/02/2022 Discharge date: 12/05/2022  Admitted From: Home Disposition:  Home  Discharge Condition:Stable CODE STATUS:FULL Diet recommendation: Carb Modified   Brief/Interim Summary: Patient is a 67 year old female with history of type 2 diabetes who presents with complaint of nausea, vomiting after having a bowel prep for colonoscopy.  History of marijuana intake.  UDS positive for THC.  UA positive for ketones and glucosuria.  Lab work showed potassium of 2.5.  EKG showed QTc of more than 600.  Patient was started on IV fluids.  CT abdomen/pelvis showed possible right lower lobe pneumonia without any intra-abdominal pathology, started on antibiotics.  She feels much better today.  Tolerating diet.  Counseled cessation of marijuana.  Medically stable for discharge home today.    Following problems were addressed during the hospitalization:  Intractable nausea/vomiting: Started having nausea and vomiting after being bowel prep for colonoscopy.  Cannabinoid hyperemesis syndrome could play a role here too.  UDS positive for marijuana.  Continue antiemetics, IV fluids. Normal liver enzymes.  X-ray showed mildly distended loops of small bowel in the upper abdomen.  GI was consulted, now signed off.  GI will reschedule the colonoscopy as an outpatient. Diet advanced to solid   Possible community-acquired pneumonia:CT imaging showed possible focal infiltrate on right lower lobe.  Continue current antibiotics.  Leukocytosis improving, currently afebrile.  Denies any shortness of breath or cough.  Check CBC in a week to check for WBC count   Prolonged QTc: Follow-up EKG shows improvement   Diabetes mellitus: Recently diagnosed.  Likely type 2 diabetes.  A1c of 7.5.  Takes Januvia at home.  To continue to monitor blood sugars at home.   Marijuana use: Counseled  cessation.  This could be triggering the nausea/vomiting.   Hypokalemia: Supplemented    History of hypertension: BP stable this morning.  Continue home medications  Discharge Diagnoses:  Principal Problem:   Intractable nausea and vomiting Active Problems:   Prolonged QT interval    Discharge Instructions  Discharge Instructions     Diet Carb Modified   Complete by: As directed    Discharge instructions   Complete by: As directed    1)Please take your medications as instructed 2)Follow up with your PCP in a week.Do  a CBC test during the follow up 3)Monitor your blood pressure 4)Please stop marijuana   Increase activity slowly   Complete by: As directed       Allergies as of 12/05/2022   No Known Allergies      Medication List     TAKE these medications    amLODipine 10 MG tablet Commonly known as: NORVASC Take 1 tablet (10 mg total) by mouth daily.   amoxicillin-clavulanate 875-125 MG tablet Commonly known as: AUGMENTIN Take 1 tablet by mouth every 12 (twelve) hours for 4 days.   DULoxetine 30 MG capsule Commonly known as: CYMBALTA TAKE ONE CAPSULE BY MOUTH TWICE DAILY @ 9AM & 5PM What changed: See the new instructions.   losartan-hydrochlorothiazide 100-25 MG tablet Commonly known as: HYZAAR Take 1 tablet by mouth daily.   methocarbamol 500 MG tablet Commonly known as: ROBAXIN Take 1 tablet (500 mg total) by mouth every 8 (eight) hours as needed for muscle spasms.   metoprolol succinate 25 MG 24 hr tablet Commonly known as: TOPROL-XL Take 1 tablet (25 mg total) by mouth daily.   ondansetron 4 MG tablet  Commonly known as: ZOFRAN Take 1 tablet (4 mg total) by mouth every 8 (eight) hours as needed for nausea or vomiting.   oxyCODONE-acetaminophen 5-325 MG tablet Commonly known as: PERCOCET/ROXICET Take 1 tablet by mouth every 6 (six) hours as needed for moderate pain or severe pain.   pantoprazole 40 MG tablet Commonly known as: PROTONIX TAKE  ONE TABLET (40mg  total) BY MOUTH DAILY AT 9AM What changed: See the new instructions.   potassium chloride SA 20 MEQ tablet Commonly known as: KLOR-CON M Take 2 tablets (40 mEq total) by mouth daily for 3 days.   pregabalin 50 MG capsule Commonly known as: LYRICA Take 50 mg by mouth 3 (three) times daily.   rosuvastatin 40 MG tablet Commonly known as: CRESTOR Take 1 tablet (40 mg total) by mouth daily.   sitaGLIPtin 25 MG tablet Commonly known as: Januvia Take 1 tablet (25 mg total) by mouth daily.        Follow-up Information     [name], NP. Schedule an appointment as soon as possible for a visit.   Specialty: Internal Medicine Why: Call the clinic and schedule a hospital follow up in the next 7-10 days. Contact information: [address] [place] [address] [phone]                No Known Allergies  Consultations: GI   Procedures/Studies: CT ABDOMEN PELVIS W CONTRAST  Result Date: 12/03/2022 CLINICAL DATA:  Nonlocalized abdominal pain, vomiting EXAM: CT ABDOMEN AND PELVIS WITH CONTRAST TECHNIQUE: Multidetector CT imaging of the abdomen and pelvis was performed using the standard protocol following bolus administration of intravenous contrast. RADIATION DOSE REDUCTION: This exam was performed according to the departmental dose-optimization program which includes automated exposure control, adjustment of the mA and/or kV according to patient size and/or use of iterative reconstruction technique. CONTRAST:  75mL OMNIPAQUE IOHEXOL 350 MG/ML SOLN COMPARISON:  10/09/2020 FINDINGS: Lower chest: Focal pulmonary infiltrate within the medial basal segment of the right lower lobe may be infectious or inflammatory in nature. Subsegmental atelectasis within the medial right middle lobe. Extensive coronary artery calcification. Global cardiac size within normal limits. Hepatobiliary: Mild hepatic steatosis. No enhancing intrahepatic mass. Status  post cholecystectomy. Mild intra and extrahepatic biliary ductal dilation likely reflects post cholecystectomy change. Pancreas: Unremarkable Spleen: Unremarkable Adrenals/Urinary Tract: The adrenal glands are unremarkable. The kidneys are normal in size and position. There is moderate right hydronephrosis and dilation of the right renal pelvis with decompression of the right ureter in keeping with a moderate right UPJ obstruction, similar to prior examination. Multiple simple cortical cysts are seen within the left kidney for which no follow-up imaging is recommended. No enhancing intrarenal mass. No perinephric fluid collection. Excreted contrast opacifies the renal collecting systems bilaterally. Bladder unremarkable. Stomach/Bowel: Severe descending and sigmoid colonic diverticulosis. The stomach, small bowel, and large bowel are otherwise unremarkable. No evidence of obstruction or focal inflammation. No free intraperitoneal gas or fluid. The appendix is normal. Vascular/Lymphatic: Aortic atherosclerosis. No enlarged abdominal or pelvic lymph nodes. Reproductive: Involuted calcified fibroids noted within the uterus. The pelvic organs are otherwise unremarkable. Other: Tiny fat containing umbilical hernia. Musculoskeletal: No acute bone abnormality. No lytic or blastic bone lesion. Degenerative changes are seen within the lumbar spine with grade 1 anterolisthesis L4-5. IMPRESSION: 1. No acute intra-abdominal pathology identified. No definite radiographic explanation for the patient's reported symptoms. 2. Focal pulmonary infiltrate within the medial basal segment of the right lower lobe, possibly infectious or  inflammatory in nature. 3. Extensive coronary artery calcification. 4. Mild hepatic steatosis. 5. Severe distal colonic diverticulosis without superimposed acute inflammatory change. 6. Aortic atherosclerosis. Aortic Atherosclerosis (ICD10-I70.0). Electronically Signed   By: Helyn Numbers M.D.   On:  12/03/2022 02:51   DG Abdomen Acute W/Chest  Result Date: 12/02/2022 CLINICAL DATA:  Abdominal pain with vomiting. EXAM: DG ABDOMEN ACUTE WITH 1 VIEW CHEST COMPARISON:  10/03/2021. FINDINGS: A nonspecific distended loop of small bowel is noted in the upper abdomen measuring 3.6 cm. No intra-abdominal free air. No radiopaque calculi. Heart size and mediastinal contours are within normal limits. Both lungs are clear. Cervical spinal fusion hardware is noted. Surgical clips are present in the right upper quadrant. Degenerative changes are present in the thoracolumbar spine. IMPRESSION: 1. Mildly distended loop of small bowel in the upper abdomen, possible ileus or obstruction. 2. No acute cardiopulmonary disease. Electronically Signed   By: Thornell Sartorius M.D.   On: 12/02/2022 23:57   DG Foot 2 Views Left  Result Date: 11/15/2022 Please see detailed radiograph report in office note.  DG Foot Complete Right  Result Date: 11/15/2022 Please see detailed radiograph report in office note.     Subjective: Patient seen and examined at bedside today.  Hemodynamically stable. denies nausea, vomiting or abdominal pain.  Medically stable for discharge home today  Discharge Exam: Vitals:   12/05/22 0632 12/05/22 0743  BP: (!) 179/105 129/71  Pulse: 89 93  Resp: 18 18  Temp:    SpO2: 96% 100%   Vitals:   12/04/22 2026 12/05/22 0445 12/05/22 0632 12/05/22 0743  BP: (!) 165/96 (!) 173/107 (!) 179/105 129/71  Pulse: 100 83 89 93  Resp: 18 18 18 18   Temp: 98 F (36.7 C) 98.2 F (36.8 C)    TempSrc: Oral     SpO2: 100% 100% 96% 100%  Weight:      Height:        General: Pt is alert, awake, not in acute distress Cardiovascular: RRR, S1/S2 +, no rubs, no gallops Respiratory: CTA bilaterally, no wheezing, no rhonchi Abdominal: Soft, NT, ND, bowel sounds + Extremities: no edema, no cyanosis    The results of significant diagnostics from this hospitalization (including imaging, microbiology,  ancillary and laboratory) are listed below for reference.     Microbiology: Recent Results (from the past 240 hour(s))  MRSA Next Gen by PCR, Nasal     Status: None   Collection Time: 12/03/22 11:21 AM   Specimen: Nasal Mucosa; Nasal Swab  Result Value Ref Range Status   MRSA by PCR Next Gen NOT DETECTED NOT DETECTED Final    Comment: (NOTE) The GeneXpert MRSA Assay (FDA approved for NASAL specimens only), is one component of a comprehensive MRSA colonization surveillance program. It is not intended to diagnose MRSA infection nor to guide or monitor treatment for MRSA infections. Test performance is not FDA approved in patients less than 19 years old. Performed at [facility], [address] [place], [place] [address]      Labs: BNP (last 3 results) No results for input(s): "BNP" in the last 8760 hours. Basic Metabolic Panel: Recent Labs  Lab 12/03/22 0029 12/03/22 0355 12/03/22 0958 12/04/22 0700  NA 137 135 136 139  K 2.5* 2.4* 3.0* 3.5  CL 100 97* 98 103  CO2 20* 19* 20* 21*  GLUCOSE 278* 237* 245* 177*  BUN 12 10 9 9   CREATININE 0.78 0.86 0.76 0.64  CALCIUM 10.0 9.6 9.5  9.3  MG 1.9  --   --  1.7   Liver Function Tests: Recent Labs  Lab 12/03/22 0029  AST 29  ALT 21  ALKPHOS 97  BILITOT 0.8  PROT 8.4*  ALBUMIN 4.7   Recent Labs  Lab 12/03/22 0029  LIPASE 61*   No results for input(s): "AMMONIA" in the last 168 hours. CBC: Recent Labs  Lab 12/03/22 0029 12/03/22 0355 12/04/22 0700 12/05/22 0305  WBC 21.5* 20.7* 16.9* 16.6*  NEUTROABS 18.4*  --   --   --   HGB 13.6 13.8 13.2 13.2  HCT 41.0 42.1 39.9 39.6  MCV 84.7 84.7 81.4 81.1  PLT 347 334 318 335   Cardiac Enzymes: No results for input(s): "CKTOTAL", "CKMB", "CKMBINDEX", "TROPONINI" in the last 168 hours. BNP: Invalid input(s): "POCBNP" CBG: Recent Labs  Lab 12/04/22 1701 12/04/22 2024 12/05/22 0020 12/05/22 0441 12/05/22 0840  GLUCAP 170* 157* 176* 161* 134*    D-Dimer No results for input(s): "DDIMER" in the last 72 hours. Hgb A1c Recent Labs    12/03/22 0355  HGBA1C 7.5*   Lipid Profile No results for input(s): "CHOL", "HDL", "LDLCALC", "TRIG", "CHOLHDL", "LDLDIRECT" in the last 72 hours. Thyroid function studies No results for input(s): "TSH", "T4TOTAL", "T3FREE", "THYROIDAB" in the last 72 hours.  Invalid input(s): "FREET3" Anemia work up No results for input(s): "VITAMINB12", "FOLATE", "FERRITIN", "TIBC", "IRON", "RETICCTPCT" in the last 72 hours. Urinalysis    Component Value Date/Time   COLORURINE STRAW (A) 12/02/2022 2327   APPEARANCEUR CLEAR 12/02/2022 2327   LABSPEC 1.013 12/02/2022 2327   PHURINE 6.0 12/02/2022 2327   GLUCOSEU >=500 (A) 12/02/2022 2327   HGBUR NEGATIVE 12/02/2022 2327   BILIRUBINUR NEGATIVE 12/02/2022 2327   BILIRUBINUR neg 10/25/2014 1506   KETONESUR 20 (A) 12/02/2022 2327   PROTEINUR 30 (A) 12/02/2022 2327   UROBILINOGEN 0.2 06/14/2022 0848   NITRITE NEGATIVE 12/02/2022 2327   LEUKOCYTESUR TRACE (A) 12/02/2022 2327   Sepsis Labs Recent Labs  Lab 12/03/22 0029 12/03/22 0355 12/04/22 0700 12/05/22 0305  WBC 21.5* 20.7* 16.9* 16.6*   Microbiology Recent Results (from the past 240 hour(s))  MRSA Next Gen by PCR, Nasal     Status: None   Collection Time: 12/03/22 11:21 AM   Specimen: Nasal Mucosa; Nasal Swab  Result Value Ref Range Status   MRSA by PCR Next Gen NOT DETECTED NOT DETECTED Final    Comment: (NOTE) The GeneXpert MRSA Assay (FDA approved for NASAL specimens only), is one component of a comprehensive MRSA colonization surveillance program. It is not intended to diagnose MRSA infection nor to guide or monitor treatment for MRSA infections. Test performance is not FDA approved in patients less than 13 years old. Performed at [facility], [address] [place], [place] [address]     Please note: You were cared for by a hospitalist during your hospital stay. Once you  are discharged, your primary care physician will handle any further medical issues. Please note that NO REFILLS for any discharge medications will be authorized once you are discharged, as it is imperative that you return to your primary care physician (or establish a relationship with a primary care physician if you do not have one) for your post hospital discharge needs so that they can reassess your need for medications and monitor your lab values.    Time coordinating discharge: 40 minutes  SIGNED:   Burnadette Pop, MD  [facility] 12/05/2022, 11:32 AM Pager [phone]  If 7PM-7AM,  please contact night-coverage [email or web address] Password [email or web address]

## 2022-12-05 NOTE — Telephone Encounter (Signed)
Contacted pt to see if she would like to switch appt to virtual or reschedule. Pt stated she was just discharged from the hospital today. Pt states we can reschedule appt missed. Pt has been rescheduled for 12/10/22 at 2:50pm

## 2022-12-05 NOTE — Progress Notes (Signed)
PHARMACIST - PHYSICIAN COMMUNICATION DR:   Renford Dills CONCERNING: Antibiotic IV to Oral Route Change Policy  RECOMMENDATION: This patient is receiving doxycycline by the intravenous route.  Based on criteria approved by the Pharmacy and Therapeutics Committee, the antibiotic(s) is/are being converted to the equivalent oral dose form(s).   DESCRIPTION: These criteria include: Patient being treated for a respiratory tract infection, urinary tract infection, cellulitis or clostridium difficile associated diarrhea if on metronidazole The patient is not neutropenic and does not exhibit a GI malabsorption state The patient is eating (either orally or via tube) and/or has been taking other orally administered medications for a least 24 hours The patient is improving clinically and has a Tmax < 100.5  If you have questions about this conversion, please contact the Pharmacy Department  []   [phone] )  [facility] []   [phone] )  [facility] [x]   [phone] )  [facility] []   [phone] )  [facility] []   [phone] )  [facility]

## 2022-12-06 ENCOUNTER — Other Ambulatory Visit: Payer: Self-pay

## 2022-12-06 ENCOUNTER — Emergency Department (HOSPITAL_COMMUNITY)
Admission: EM | Admit: 2022-12-06 | Discharge: 2022-12-06 | Disposition: A | Discharge status: Home / Self Care | Payer: Medicare Other | Source: Home / Self Care | Attending: Emergency Medicine | Admitting: Emergency Medicine

## 2022-12-06 ENCOUNTER — Telehealth (INDEPENDENT_AMBULATORY_CARE_PROVIDER_SITE_OTHER): Payer: Self-pay

## 2022-12-06 ENCOUNTER — Emergency Department (HOSPITAL_COMMUNITY)
Admission: EM | Admit: 2022-12-06 | Discharge: 2022-12-06 | Discharge status: Against Medical Advice | Payer: Medicare Other | Attending: Emergency Medicine | Admitting: Emergency Medicine

## 2022-12-06 ENCOUNTER — Ambulatory Visit (INDEPENDENT_AMBULATORY_CARE_PROVIDER_SITE_OTHER): Payer: Self-pay

## 2022-12-06 ENCOUNTER — Encounter (HOSPITAL_COMMUNITY): Payer: Self-pay

## 2022-12-06 DIAGNOSIS — Z8673 Personal history of transient ischemic attack (TIA), and cerebral infarction without residual deficits: Secondary | ICD-10-CM | POA: Insufficient documentation

## 2022-12-06 DIAGNOSIS — R112 Nausea with vomiting, unspecified: Secondary | ICD-10-CM | POA: Insufficient documentation

## 2022-12-06 DIAGNOSIS — Z5321 Procedure and treatment not carried out due to patient leaving prior to being seen by health care provider: Secondary | ICD-10-CM | POA: Diagnosis not present

## 2022-12-06 DIAGNOSIS — E119 Type 2 diabetes mellitus without complications: Secondary | ICD-10-CM | POA: Insufficient documentation

## 2022-12-06 DIAGNOSIS — I10 Essential (primary) hypertension: Secondary | ICD-10-CM | POA: Insufficient documentation

## 2022-12-06 DIAGNOSIS — R197 Diarrhea, unspecified: Secondary | ICD-10-CM

## 2022-12-06 DIAGNOSIS — R531 Weakness: Secondary | ICD-10-CM | POA: Insufficient documentation

## 2022-12-06 DIAGNOSIS — Z79899 Other long term (current) drug therapy: Secondary | ICD-10-CM | POA: Insufficient documentation

## 2022-12-06 LAB — URINALYSIS, ROUTINE W REFLEX MICROSCOPIC
Bilirubin Urine: NEGATIVE
Glucose, UA: NEGATIVE mg/dL
Glucose, UA: NEGATIVE mg/dL
Hgb urine dipstick: NEGATIVE
Hgb urine dipstick: NEGATIVE
Ketones, ur: NEGATIVE mg/dL
Ketones, ur: NEGATIVE mg/dL
Nitrite: NEGATIVE
Nitrite: NEGATIVE
Protein, ur: NEGATIVE mg/dL
Protein, ur: 100 mg/dL — AB
Specific Gravity, Urine: 1.025 (ref 1.005–1.030)
Specific Gravity, Urine: 1.017 (ref 1.005–1.030)
pH: 6 (ref 5.0–8.0)
pH: 5 (ref 5.0–8.0)

## 2022-12-06 LAB — CBC WITH DIFFERENTIAL/PLATELET
Abs Immature Granulocytes: 0.18 10*3/uL — ABNORMAL HIGH (ref 0.00–0.07)
Basophils Absolute: 0.1 10*3/uL (ref 0.0–0.1)
Basophils Relative: 1 %
Eosinophils Absolute: 0.1 10*3/uL (ref 0.0–0.5)
Eosinophils Relative: 1 %
HCT: 42 % (ref 36.0–46.0)
Hemoglobin: 13.7 g/dL (ref 12.0–15.0)
Immature Granulocytes: 1 %
Lymphocytes Relative: 25 %
Lymphs Abs: 3.5 10*3/uL (ref 0.7–4.0)
MCH: 26.9 pg (ref 26.0–34.0)
MCHC: 32.6 g/dL (ref 30.0–36.0)
MCV: 82.5 fL (ref 80.0–100.0)
Monocytes Absolute: 0.9 10*3/uL (ref 0.1–1.0)
Monocytes Relative: 6 %
Neutro Abs: 9.5 10*3/uL — ABNORMAL HIGH (ref 1.7–7.7)
Neutrophils Relative %: 66 %
Platelets: 364 10*3/uL (ref 150–400)
RBC: 5.09 MIL/uL (ref 3.87–5.11)
RDW: 13.7 % (ref 11.5–15.5)
WBC: 14.2 10*3/uL — ABNORMAL HIGH (ref 4.0–10.5)
nRBC: 0 % (ref 0.0–0.2)

## 2022-12-06 LAB — COMPREHENSIVE METABOLIC PANEL
ALT: 19 U/L (ref 0–44)
ALT: 21 U/L (ref 0–44)
AST: 22 U/L (ref 15–41)
AST: 23 U/L (ref 15–41)
Albumin: 4.1 g/dL (ref 3.5–5.0)
Albumin: 4.2 g/dL (ref 3.5–5.0)
Alkaline Phosphatase: 80 U/L (ref 38–126)
Alkaline Phosphatase: 81 U/L (ref 38–126)
Anion gap: 14 (ref 5–15)
Anion gap: 13 (ref 5–15)
BUN: 25 mg/dL — ABNORMAL HIGH (ref 8–23)
BUN: 30 mg/dL — ABNORMAL HIGH (ref 8–23)
CO2: 20 mmol/L — ABNORMAL LOW (ref 22–32)
CO2: 22 mmol/L (ref 22–32)
Calcium: 9.6 mg/dL (ref 8.9–10.3)
Calcium: 9.4 mg/dL (ref 8.9–10.3)
Chloride: 101 mmol/L (ref 98–111)
Chloride: 99 mmol/L (ref 98–111)
Creatinine, Ser: 1.03 mg/dL — ABNORMAL HIGH (ref 0.44–1.00)
Creatinine, Ser: 1.05 mg/dL — ABNORMAL HIGH (ref 0.44–1.00)
GFR, Estimated: 59 mL/min — ABNORMAL LOW (ref >60)
GFR, Estimated: 58 mL/min — ABNORMAL LOW (ref >60)
Glucose, Bld: 157 mg/dL — ABNORMAL HIGH (ref 70–99)
Glucose, Bld: 193 mg/dL — ABNORMAL HIGH (ref 70–99)
Potassium: 2.7 mmol/L — CL (ref 3.5–5.1)
Potassium: 2.8 mmol/L — ABNORMAL LOW (ref 3.5–5.1)
Sodium: 135 mmol/L (ref 135–145)
Sodium: 134 mmol/L — ABNORMAL LOW (ref 135–145)
Total Bilirubin: 1.1 mg/dL (ref 0.3–1.2)
Total Bilirubin: 1.2 mg/dL (ref 0.3–1.2)
Total Protein: 7.6 g/dL (ref 6.5–8.1)
Total Protein: 8.4 g/dL — ABNORMAL HIGH (ref 6.5–8.1)

## 2022-12-06 LAB — CBC
HCT: 44.5 % (ref 36.0–46.0)
Hemoglobin: 15 g/dL (ref 12.0–15.0)
MCH: 27.9 pg (ref 26.0–34.0)
MCHC: 33.7 g/dL (ref 30.0–36.0)
MCV: 82.9 fL (ref 80.0–100.0)
Platelets: 355 10*3/uL (ref 150–400)
RBC: 5.37 MIL/uL — ABNORMAL HIGH (ref 3.87–5.11)
RDW: 13.7 % (ref 11.5–15.5)
WBC: 15.3 10*3/uL — ABNORMAL HIGH (ref 4.0–10.5)
nRBC: 0 % (ref 0.0–0.2)

## 2022-12-06 LAB — CBG MONITORING, ED: Glucose-Capillary: 180 mg/dL — ABNORMAL HIGH (ref 70–99)

## 2022-12-06 LAB — URINALYSIS, MICROSCOPIC (REFLEX)

## 2022-12-06 LAB — LIPASE, BLOOD: Lipase: 37 U/L (ref 11–51)

## 2022-12-06 MED ORDER — POTASSIUM CHLORIDE CRYS ER 20 MEQ PO TBCR
40.0000 meq | EXTENDED_RELEASE_TABLET | Freq: Once | ORAL | Status: AC
  Administered 2022-12-06: 40 meq via ORAL
  Filled 2022-12-06: qty 2

## 2022-12-06 MED ORDER — METOCLOPRAMIDE HCL 5 MG/ML IJ SOLN
10.0000 mg | Freq: Once | INTRAMUSCULAR | Status: AC
  Administered 2022-12-06: 10 mg via INTRAVENOUS
  Filled 2022-12-06: qty 2

## 2022-12-06 MED ORDER — SODIUM CHLORIDE 0.9 % IV BOLUS
1000.0000 mL | Freq: Once | INTRAVENOUS | Status: AC
  Administered 2022-12-06: 1000 mL via INTRAVENOUS

## 2022-12-06 MED ORDER — POTASSIUM CHLORIDE 10 MEQ/100ML IV SOLN
10.0000 meq | INTRAVENOUS | Status: DC
  Administered 2022-12-06: 10 meq via INTRAVENOUS
  Filled 2022-12-06: qty 100

## 2022-12-06 MED ORDER — LOPERAMIDE HCL 2 MG PO CAPS: 2.0000 mg | ORAL_CAPSULE | Freq: Four times a day (QID) | ORAL | 0 refills | Status: DC | PRN

## 2022-12-06 NOTE — Transitions of Care (Post Inpatient/ED Visit) (Signed)
   12/06/2022  Name: [name] MRN: [id number] DOB: 08/26/1955  Today's TOC FU Call Status: Today's TOC FU Call Status:: Unsuccessul Call (1st Attempt) Unsuccessful Call (1st Attempt) Date: 12/06/22  Attempted to reach the patient regarding the most recent Inpatient/ED visit.  Follow Up Plan: Additional outreach attempts will be made to reach the patient to complete the Transitions of Care (Post Inpatient/ED visit) call.   Signature   Woodfin Ganja LPN [facility] Nurse Health Advisor Direct Dial [phone]

## 2022-12-06 NOTE — ED Triage Notes (Signed)
Pt c/o N/V/D. Pt states she was D/C'd yesterday ad she still has N/V/D and she can;t keep anything down including the meds she was given.

## 2022-12-06 NOTE — ED Notes (Signed)
Pt approached registration desk and told them that this was taking too long and that she was leaving.

## 2022-12-06 NOTE — ED Triage Notes (Signed)
Pt arrived via POV. C/o nausea and diarrhea for over a week. Was Dc/d from hosp for tx of same.  Aox4

## 2022-12-06 NOTE — ED Notes (Signed)
Patient IV failed and other attempts. Medication was on hold due to unsuccessfully  attempts

## 2022-12-06 NOTE — Telephone Encounter (Signed)
  Chief Complaint: Hosp FU Symptoms: Nausea and frequent diarrhea Frequency: dc'd yesterday been very sick today and unable to eat Pertinent Negatives: NA Disposition: [x] ED /[] Urgent Care (no appt availability in office) / [] Appointment(In office/virtual)/ []  [facility]/ [] Home Care/ [] Refused Recommended Disposition /[] [facility]/ []  Follow-up with PCP Additional Notes: pt was dc'd yesterday from hospital for n/v and PNA and pt unable to take the Kcl and Amoxicillin they prescribed d/t pills are too big and can't swallow. Pt states she can't eat and having frequent diarrhea where she feels weak and drained and just like she was when she went to ED. I advised pt to go back to ED for eval and possibly will need admitted for few more days d/t sx. Pt verbalized understanding.   Reason for Disposition  Patient sounds very sick or weak to the triager  Answer Assessment - Initial Assessment Questions 1. MAIN CONCERN OR SYMPTOM:  "What is your main concern right now?" "What question do you have?" "What's the main symptom you're worried about?" (e.g., breathing difficulty, ankle swelling, weight gain.)     Having sx just as bad when went to ED 2. ONSET: "When did the  sx  start?"     12/03/22 3. BETTER-SAME-WORSE: "Are you getting better, staying the same, or getting worse compared to the day you were discharged?"     Same as when went to ED 4. HOSPITALIZATION: "How long were you hospitalized?" (e.g., days)     2-3 days 5. DISCHARGE DIAGNOSIS:  "What problem or disease were you hospitalized for?"     N/V and PNA 6. DISCHARGE DATE: "What date were you discharged from the hospital?"     12/05/22 8. DISCHARGE APPOINTMENT: "Have you scheduled a follow-up discharge appointment with your doctor?"     no 9. DISCHARGE MEDICINES: "Did the doctor (or NP/PA) who discharged you order any new medicines for you to use? If yes, have you filled the prescription and started taking the  medicine?"      Kcl and Amoxicillin, pt unable to take both with vomiting back up, too big to go down even breaking in half 12. OTHER SYMPTOMS: "Do you have any other symptoms?"       Nausea, dry heaves, diarrhea x 3 in last hr, unable to eat  Protocols used: Post-Hospitalization Follow-up Call-A-AH

## 2022-12-06 NOTE — ED Provider Notes (Signed)
[place] EMERGENCY DEPARTMENT AT [facility] Provider Note   CSN: [id number] Arrival date & time: 12/06/22  1820     History  Chief Complaint  Patient presents with   Nausea   Weakness   Diarrhea    [name] is a 67 y.o. female with a past history of anemia, arthritis, diabetes, GERD, hyperlipidemia, hypertension presents today for evaluation of decreased appetite, diarrhea.  Patient was just discharged from the hospital after 4 days of hospitalization.  States she continues to have decreased p.o. intake.  States she also has nonbloody diarrhea after being discharged.  Denies any abdominal pain. States she does not take any medication after being discharge. She skipped her potassium pills, saying they are too big to take. Of note patient has a CT scan on 12/03/2022 which showed no acute intra-abdominal abnormalities.   Weakness Associated symptoms: diarrhea   Diarrhea     Past Medical History:  Diagnosis Date   Anemia    hx   Aneurysm (HCC)    small, on left side of brain    Anxiety    hx of   Arthritis    generalized   Diabetes mellitus without complication (HCC)    diet control, pt denies- on medications   GERD (gastroesophageal reflux disease)    iwht certain foods/on meds/OTC PRN meds   Hyperlipidemia    on meds   Hypertension    on meds   TIA (transient ischemic attack)    pt unaware of this hx on 06/19/2017   Past Surgical History:  Procedure Laterality Date   ANTERIOR CERVICAL DECOMP/DISCECTOMY FUSION     BACK SURGERY     CARPAL TUNNEL RELEASE Left 2024   CESAREAN SECTION  1986   FOOT SURGERY Left 11/2019   IR GENERIC HISTORICAL  09/21/2014   IR ANGIO INTRA EXTRACRAN SEL INTERNAL CAROTID UNI L MOD SED 09/21/2014 Lisbeth Renshaw, MD [facility]   IR GENERIC HISTORICAL  09/21/2014   IR ANGIO INTRA EXTRACRAN SEL COM CAROTID INNOMINATE UNI R MOD SED 09/21/2014 Lisbeth Renshaw, MD [facility]   IR GENERIC HISTORICAL  09/21/2014    IR ANGIO VERTEBRAL SEL VERTEBRAL UNI L MOD SED 09/21/2014 Lisbeth Renshaw, MD [facility]   IR GENERIC HISTORICAL  09/21/2014   IR 3D INDEPENDENT WKST 09/21/2014 Lisbeth Renshaw, MD [facility]   LAPAROSCOPIC CHOLECYSTECTOMY     TUBAL LIGATION     TUBAL LIGATION       Home Medications Prior to Admission medications   Medication Sig Start Date End Date Taking? Authorizing Provider  amLODipine (NORVASC) 10 MG tablet Take 1 tablet (10 mg total) by mouth daily. 07/17/22   [name], NP  amoxicillin-clavulanate (AUGMENTIN) 875-125 MG tablet Take 1 tablet by mouth every 12 (twelve) hours for 4 days. 12/05/22 12/09/22  Burnadette Pop, MD  DULoxetine (CYMBALTA) 30 MG capsule TAKE ONE CAPSULE BY MOUTH TWICE DAILY @ 9AM & 5PM Patient taking differently: Take 30 mg by mouth 2 (two) times daily. 06/23/22   [name], NP  losartan-hydrochlorothiazide (HYZAAR) 100-25 MG tablet Take 1 tablet by mouth daily. 07/17/22   [name], NP  methocarbamol (ROBAXIN) 500 MG tablet Take 1 tablet (500 mg total) by mouth every 8 (eight) hours as needed for muscle spasms. 06/14/22   Debby Freiberg, NP  metoprolol succinate (TOPROL-XL) 25 MG 24 hr tablet Take 1 tablet (25 mg total) by mouth daily. 08/16/22   Custovic, Rozell Searing, DO  ondansetron [facility])  4 MG tablet Take 1 tablet (4 mg total) by mouth every 8 (eight) hours as needed for nausea or vomiting. 12/02/22   Sherrilyn Rist, MD  oxyCODONE-acetaminophen (PERCOCET/ROXICET) 5-325 MG tablet Take 1 tablet by mouth every 6 (six) hours as needed for moderate pain or severe pain. 10/17/22   [provider]  pantoprazole (PROTONIX) 40 MG tablet TAKE ONE TABLET (40mg  total) BY MOUTH DAILY AT 9AM Patient taking differently: Take 40 mg by mouth daily as needed (for heartburn). 06/20/22   Meryl Dare, MD  potassium chloride SA (KLOR-CON M) 20 MEQ tablet Take 2 tablets (40 mEq total) by mouth daily for 3 days. 12/05/22 12/08/22   Burnadette Pop, MD  pregabalin (LYRICA) 50 MG capsule Take 50 mg by mouth 3 (three) times daily. 04/30/22   [provider]  rosuvastatin (CRESTOR) 40 MG tablet Take 1 tablet (40 mg total) by mouth daily. 04/09/22   [name], NP  sitaGLIPtin (JANUVIA) 25 MG tablet Take 1 tablet (25 mg total) by mouth daily. 07/17/22   [name], NP      Allergies    Patient has no known allergies.    Review of Systems   Review of Systems  Gastrointestinal:  Positive for diarrhea.  Neurological:  Positive for weakness.    Physical Exam Updated Vital Signs BP 121/71   Pulse 95   Temp 98.7 F (37.1 C) (Oral)   Resp 20   SpO2 100%  Physical Exam Vitals and nursing note reviewed.  Constitutional:      Appearance: Normal appearance.  HENT:     Head: Normocephalic and atraumatic.     Mouth/Throat:     Mouth: Mucous membranes are moist.  Eyes:     General: No scleral icterus. Cardiovascular:     Rate and Rhythm: Normal rate and regular rhythm.     Pulses: Normal pulses.     Heart sounds: Normal heart sounds.  Pulmonary:     Effort: Pulmonary effort is normal.     Breath sounds: Normal breath sounds.  Abdominal:     General: Abdomen is flat.     Palpations: Abdomen is soft.     Tenderness: There is no abdominal tenderness.  Musculoskeletal:        General: No deformity.  Skin:    General: Skin is warm.     Findings: No rash.  Neurological:     General: No focal deficit present.     Mental Status: She is alert.  Psychiatric:        Mood and Affect: Mood normal.     ED Results / Procedures / Treatments   Labs (all labs ordered are listed, but only abnormal results are displayed) Labs Reviewed  LIPASE, BLOOD  COMPREHENSIVE METABOLIC PANEL  CBC  URINALYSIS, ROUTINE W REFLEX MICROSCOPIC    EKG None  Radiology No results found.  Procedures Procedures    Medications Ordered in ED Medications - No data to display  ED Course/ Medical Decision  Making/ A&P Clinical Course as of 12/06/22 2218  Thu Dec 06, 2022  2109 Stable  41 YOF with a chief complaint with NVD x5 days Had hypokalemia this morning on OP labs. [CC]  2110 PO challenge and recheck. [CC]    Clinical Course User Index [CC] Glyn Ade, MD                             Medical  Decision Making Amount and/or Complexity of Data Reviewed Labs: ordered.  Risk Prescription drug management.   This patient presents to the ED for nausea, vomiting, diarrhea, this involves an extensive number of treatment options, and is a complaint that carries with a high risk of complications and morbidity.  The differential diagnosis includes small bowel obstruction, acute gastroenteritis, appendicitis, gallbladder/biliary, pancreatitis, peptic ulcer disease, etoh intoxication.  This is not an exhaustive list.  Lab tests: I ordered and personally interpreted labs.  The pertinent results include: WBC unremarkable. Hbg unremarkable. Platelets unremarkable. Electrolytes unremarkable. BUN, creatinine unremarkable. Glucose 180.  Problem list/ ED course/ Critical interventions/ Medical management: HPI: See above Vital signs within normal range and stable throughout visit. Laboratory/imaging studies significant for: See above. On physical examination, patient is afebrile and appears in no acute distress. This patient presents with nausea, vomiting & diarrhea.  There was no abdominal tenderness to palpation.  Abdominal exam without peritoneal signs.  CMP with hypokalemia 2.8.  Will start potassium repletion.  Currently euvolemic without evidence of dehydration.  Doubt invasive bacteria causing diarrhea such as C. diff.  Diarrhea is nonbloody so less likely inflammatory bowel disease.  CT scan done on 12/03/2022 show no acute intra-abdominal abnormalities.  Patient has no abdominal tenderness examination.  Unlikely surgical abdomen or other acute medical emergency including bowel obstruction,  perforation, appendicitis or cholecystitis. No indication for abdominal imaging at this time.  Patient was given food and drink and was able to tolerate p.o.  Patient is stable for discharge.  Will discharge patient after potassium repletion.  Advised patient to follow-up with gastroenterology for further evaluation and management.  Strict return precaution discussed. I have reviewed the patient home medicines and have made adjustments as needed.  Cardiac monitoring/EKG: The patient was maintained on a cardiac monitor.  I personally reviewed and interpreted the cardiac monitor which showed an underlying rhythm of: sinus rhythm.  Additional history obtained: External records from outside source obtained and reviewed including: Chart review including previous notes, labs, imaging.  Consultations obtained:  Disposition Continued outpatient therapy. Follow-up with gastroenterology recommended for reevaluation of symptoms. Treatment plan discussed with patient.  Pt acknowledged understanding was agreeable to the plan. Worrisome signs and symptoms were discussed with patient, and patient acknowledged understanding to return to the ED if they noticed these signs and symptoms. Patient was stable upon discharge.   This chart was dictated using voice recognition software.  Despite best efforts to proofread,  errors can occur which can change the documentation meaning.          Final Clinical Impression(s) / ED Diagnoses Final diagnoses:  Diarrhea, unspecified type  Nausea and vomiting, unspecified vomiting type    Rx / DC Orders ED Discharge Orders          Ordered    loperamide (IMODIUM) 2 MG capsule  4 times daily PRN        12/06/22 2219              Jeanelle Malling, PA 12/07/22 1105    Glyn Ade, MD 12/10/22 [phone]

## 2022-12-06 NOTE — ED Notes (Signed)
Patient given crackers and a beverage for PO challenge.

## 2022-12-06 NOTE — Telephone Encounter (Signed)
Noted  

## 2022-12-06 NOTE — Discharge Instructions (Signed)
Please take your medications including potassium and imodium as prescribed. Take tylenol/ibuprofen for pain. I recommend close follow-up with PCP for reevaluation.  Please do not hesitate to return to emergency department if worrisome signs symptoms we discussed become apparent.

## 2022-12-07 NOTE — Telephone Encounter (Signed)
noted 

## 2022-12-10 ENCOUNTER — Ambulatory Visit (INDEPENDENT_AMBULATORY_CARE_PROVIDER_SITE_OTHER): Payer: Medicare Other | Admitting: Primary Care

## 2022-12-10 ENCOUNTER — Encounter (INDEPENDENT_AMBULATORY_CARE_PROVIDER_SITE_OTHER): Payer: Self-pay | Admitting: Primary Care

## 2022-12-10 VITALS — BP 144/91 | HR 105 | Resp 16 | Wt 112.4 lb

## 2022-12-10 DIAGNOSIS — Z09 Encounter for follow-up examination after completed treatment for conditions other than malignant neoplasm: Secondary | ICD-10-CM | POA: Diagnosis not present

## 2022-12-10 NOTE — Progress Notes (Signed)
[facility]   Subjective:  [name] is a 67 y.o. female presents for hospital follow up . Admit date to the hospital was 12/02/22, patient was discharged from the hospital on 12/05/22, patient was admitted for: Diarrhea, unspecified type ,Nausea and vomiting, unspecified vomiting type.  Prior to admission she was seen in the emergency room for similar signs and symptoms.  Patient initially was irate she tried to call the office and they told her the provider was not in and there was no one she could talk to.  She question whether or not a doctor on call or to do not be reached that the answer to that is yes.  As you know I answer your chart messages over the weekends and after 7 PM.  However as I have repeated over and over and hospital physicians are also thinking that the symptoms are due to anxiety and stress.  This is also the case she works and she is the only one who takes care of her mother.  As soon as patient wakes up and her mother hears her feet moving she starts saying [name].... She is involved in the pace program but very little to no time for herself.  No GI symptoms have resolved Past Medical History:  Diagnosis Date   Anemia    hx   Aneurysm (HCC)    small, on left side of brain    Anxiety    hx of   Arthritis    generalized   Diabetes mellitus without complication (HCC)    diet control, pt denies- on medications   GERD (gastroesophageal reflux disease)    iwht certain foods/on meds/OTC PRN meds   Hyperlipidemia    on meds   Hypertension    on meds   TIA (transient ischemic attack)    pt unaware of this hx on 06/19/2017     No Known Allergies    Current Outpatient Medications on File Prior to Visit  Medication Sig Dispense Refill   amLODipine (NORVASC) 10 MG tablet Take 1 tablet (10 mg total) by mouth daily. 90 tablet 1   DULoxetine (CYMBALTA) 30 MG capsule TAKE ONE CAPSULE BY MOUTH TWICE DAILY @ 9AM & 5PM (Patient taking  differently: Take 30 mg by mouth 2 (two) times daily.) 180 capsule 1   loperamide (IMODIUM) 2 MG capsule Take 1 capsule (2 mg total) by mouth 4 (four) times daily as needed for diarrhea or loose stools. 12 capsule 0   losartan-hydrochlorothiazide (HYZAAR) 100-25 MG tablet Take 1 tablet by mouth daily. 90 tablet 1   methocarbamol (ROBAXIN) 500 MG tablet Take 1 tablet (500 mg total) by mouth every 8 (eight) hours as needed for muscle spasms. 20 tablet 0   metoprolol succinate (TOPROL-XL) 25 MG 24 hr tablet Take 1 tablet (25 mg total) by mouth daily. 90 tablet 1   ondansetron (ZOFRAN) 4 MG tablet Take 1 tablet (4 mg total) by mouth every 8 (eight) hours as needed for nausea or vomiting. 12 tablet 0   oxyCODONE-acetaminophen (PERCOCET/ROXICET) 5-325 MG tablet Take 1 tablet by mouth every 6 (six) hours as needed for moderate pain or severe pain.     pantoprazole (PROTONIX) 40 MG tablet TAKE ONE TABLET (40mg  total) BY MOUTH DAILY AT 9AM (Patient taking differently: Take 40 mg by mouth daily as needed (for heartburn).) 30 tablet 0   potassium chloride SA (KLOR-CON M) 20 MEQ tablet Take 2 tablets (40 mEq total) by mouth daily for 3  days. 6 tablet 0   pregabalin (LYRICA) 50 MG capsule Take 50 mg by mouth 3 (three) times daily.     rosuvastatin (CRESTOR) 40 MG tablet Take 1 tablet (40 mg total) by mouth daily. 90 tablet 3   sitaGLIPtin (JANUVIA) 25 MG tablet Take 1 tablet (25 mg total) by mouth daily. 90 tablet 1   No current facility-administered medications on file prior to visit.     Review of System: Comprehensive ROS Pertinent positive and negative noted in HPI    Objective:  There were no vitals taken for this visit.  There were no vitals filed for this visit.  Physical Exam: General Appearance: Thin frame, in no apparent distress. Eyes: PERRLA, EOMs, conjunctiva no swelling or erythema Sinuses: No Frontal/maxillary tenderness ENT/Mouth: Ext aud canals clear, TMs without erythema,  bulging.Hearing normal.  Neck: Supple, thyroid normal.  Respiratory: Respiratory effort normal, BS equal bilaterally without rales, rhonchi, wheezing or stridor.  Cardio: RRR with no MRGs. Brisk peripheral pulses without edema.  Abdomen: Soft, + BS.  Non tender, no guarding, rebound, hernias, masses. Lymphatics: Non tender without lymphadenopathy.  Musculoskeletal: Full ROM, 5/5 strength, normal gait.  Skin: Warm, dry without rashes, lesions, ecchymosis.  Neuro: Cranial nerves intact. Normal muscle tone, no cerebellar symptoms. Sensation intact.  Psych: Awake and oriented X 3, normal affect, Insight and Judgment appropriate.    Assessment:  [name] was seen today for hospitalization follow-up.  Diagnoses and all orders for this visit:  Hospital discharge follow-up Instructions was to follow-up with PCP.  PCP included clinical nurse manager and discussing [name]'s current problems and how they are psychological and related to stress and anxiety.  [name] was pleased that she was at least in a pace program when she is not at home all day by herself.  She will look into other resources to try to help with time for self.  Explain and clinical manager agreed while the caretaker is in the home bathing dressing feeding this is when the patient should remove herself from the home and find something to do during this time.  Patient said that is what she will start doing.     This note has been created with Education officer, environmental. Any transcriptional errors are unintentional.   [name], NP 12/10/2022, 2:58 PM

## 2022-12-13 LAB — IA-2 AUTOANTIBODIES: IA-2 Autoantibodies: <7.5 U/mL

## 2022-12-17 ENCOUNTER — Encounter (INDEPENDENT_AMBULATORY_CARE_PROVIDER_SITE_OTHER): Payer: Medicare Other

## 2022-12-19 ENCOUNTER — Other Ambulatory Visit (INDEPENDENT_AMBULATORY_CARE_PROVIDER_SITE_OTHER): Payer: Self-pay | Admitting: Primary Care

## 2022-12-19 DIAGNOSIS — I10 Essential (primary) hypertension: Secondary | ICD-10-CM

## 2022-12-19 DIAGNOSIS — Z760 Encounter for issue of repeat prescription: Secondary | ICD-10-CM

## 2022-12-20 NOTE — Telephone Encounter (Signed)
Labs in date  Requested Prescriptions  Pending Prescriptions Disp Refills   DULoxetine (CYMBALTA) 30 MG capsule [Pharmacy Med Name: duloxetine 30 mg capsule,delayed release] 180 capsule 1    Sig: TAKE ONE CAPSULE BY MOUTH TWICE DAILY @ 9AM & 5PM     Psychiatry: Antidepressants - SNRI - duloxetine Failed - 12/19/2022 10:34 PM      Failed - Cr in normal range and within 360 days    Creat  Date Value Ref Range Status  02/24/2013 0.72 0.50 - 1.10 mg/dL Final   Creatinine, Ser  Date Value Ref Range Status  12/06/2022 1.05 (H) 0.44 - 1.00 mg/dL Final         Failed - Last BP in normal range    BP Readings from Last 1 Encounters:  12/10/22 (!) 144/91         Passed - eGFR is 30 or above and within 360 days    GFR calc Af Amer  Date Value Ref Range Status  05/27/2020 109 >59 mL/min/1.73 Final    Comment:    **In accordance with recommendations from the NKF-ASN Task force,**   [facility] is in the process of updating its eGFR calculation to the   2021 CKD-EPI creatinine equation that estimates kidney function   without a race variable.    GFR, Estimated  Date Value Ref Range Status  12/06/2022 58 (L) >60 mL/min Final    Comment:    (NOTE) Calculated using the CKD-EPI Creatinine Equation (2021)    eGFR  Date Value Ref Range Status  04/06/2022 89 >59 mL/min/1.73 Final         Passed - Completed PHQ-2 or PHQ-9 in the last 360 days      Passed - Valid encounter within last 6 months    Recent Outpatient Visits           1 week ago Hospital discharge follow-up   [place] [facility] [name], NP   4 months ago Hospital discharge follow-up   [place] [facility] [name], NP   6 months ago Type 2 diabetes mellitus without complication, without long-term current use of insulin (HCC)   [place] [facility] [name], NP   8 months ago Diabetes mellitus type 2 in nonobese [facility])   [place]  [facility] [name], NP   10 months ago Type 2 diabetes mellitus without complication, without long-term current use of insulin (HCC)    [facility] [name], NP       Future Appointments             In 5 months Tolia, Edgar, DO [facility]             amLODipine (NORVASC) 10 MG tablet [Pharmacy Med Name: amlodipine 10 mg tablet] 90 tablet 1    Sig: TAKE ONE TABLET (10mg  total) BY MOUTH DAILY AT 9AM     Cardiovascular: Calcium Channel Blockers 2 Failed - 12/19/2022 10:34 PM      Failed - Last BP in normal range    BP Readings from Last 1 Encounters:  12/10/22 (!) 144/91         Passed - Last Heart Rate in normal range    Pulse Readings from Last 1 Encounters:  12/10/22 (!) 105         Passed - Valid encounter within last 6 months    Recent Outpatient Visits  1 week ago Hospital discharge follow-up   [place] [facility] [name], NP   4 months ago Hospital discharge follow-up   [place] [facility] [name], NP   6 months ago Type 2 diabetes mellitus without complication, without long-term current use of insulin (HCC)   [place] [facility] [name], NP   8 months ago Diabetes mellitus type 2 in nonobese [facility])   [place] [facility] [name], NP   10 months ago Type 2 diabetes mellitus without complication, without long-term current use of insulin (HCC)   [place] [facility] [name], NP       Future Appointments             In 5 months Odis Hollingshead, [place], DO [facility]             losartan-hydrochlorothiazide (HYZAAR) 100-25 MG tablet [Pharmacy Med Name: losartan 100 mg-hydrochlorothiazide 25 mg tablet] 90 tablet 1    Sig: TAKE ONE TABLET BY MOUTH DAILY AT 9AM     Cardiovascular: ARB + Diuretic Combos  Failed - 12/19/2022 10:34 PM      Failed - K in normal range and within 180 days    Potassium  Date Value Ref Range Status  12/06/2022 2.8 (L) 3.5 - 5.1 mmol/L Final         Failed - Na in normal range and within 180 days    Sodium  Date Value Ref Range Status  12/06/2022 134 (L) 135 - 145 mmol/L Final  04/06/2022 140 134 - 144 mmol/L Final         Failed - Cr in normal range and within 180 days    Creat  Date Value Ref Range Status  02/24/2013 0.72 0.50 - 1.10 mg/dL Final   Creatinine, Ser  Date Value Ref Range Status  12/06/2022 1.05 (H) 0.44 - 1.00 mg/dL Final         Failed - Last BP in normal range    BP Readings from Last 1 Encounters:  12/10/22 (!) 144/91         Passed - eGFR is 10 or above and within 180 days    GFR calc Af Amer  Date Value Ref Range Status  05/27/2020 109 >59 mL/min/1.73 Final    Comment:    **In accordance with recommendations from the NKF-ASN Task force,**   [facility] is in the process of updating its eGFR calculation to the   2021 CKD-EPI creatinine equation that estimates kidney function   without a race variable.    GFR, Estimated  Date Value Ref Range Status  12/06/2022 58 (L) >60 mL/min Final    Comment:    (NOTE) Calculated using the CKD-EPI Creatinine Equation (2021)    eGFR  Date Value Ref Range Status  04/06/2022 89 >59 mL/min/1.73 Final         Passed - Patient is not pregnant      Passed - Valid encounter within last 6 months    Recent Outpatient Visits           1 week ago Hospital discharge follow-up   [place] [facility] [name], NP   4 months ago Hospital discharge follow-up   [place] [facility] [name], NP   6 months ago Type 2 diabetes mellitus without complication, without long-term current use of insulin (HCC)   [place] [facility] [name], NP   8 months ago Diabetes mellitus type 2 in nonobese [facility])   [facility] [name], NP   10 months ago Type 2 diabetes mellitus without complication, without long-term current use of insulin [facility])   [place] [facility] [name], NP       Future Appointments             In 5 months Clawson, [place], DO [facility]

## 2023-01-16 ENCOUNTER — Other Ambulatory Visit: Payer: Self-pay | Admitting: Internal Medicine

## 2023-02-04 ENCOUNTER — Encounter: Payer: Self-pay | Admitting: Gastroenterology

## 2023-02-04 ENCOUNTER — Ambulatory Visit: Payer: Medicare Other | Admitting: Gastroenterology

## 2023-02-04 VITALS — BP 130/76 | HR 86 | Ht 61.5 in | Wt 111.4 lb

## 2023-02-04 DIAGNOSIS — R112 Nausea with vomiting, unspecified: Secondary | ICD-10-CM | POA: Diagnosis not present

## 2023-02-04 DIAGNOSIS — K219 Gastro-esophageal reflux disease without esophagitis: Secondary | ICD-10-CM | POA: Diagnosis not present

## 2023-02-04 DIAGNOSIS — K5790 Diverticulosis of intestine, part unspecified, without perforation or abscess without bleeding: Secondary | ICD-10-CM | POA: Diagnosis not present

## 2023-02-04 DIAGNOSIS — K449 Diaphragmatic hernia without obstruction or gangrene: Secondary | ICD-10-CM

## 2023-02-04 DIAGNOSIS — Z86010 Personal history of colonic polyps: Secondary | ICD-10-CM

## 2023-02-04 MED ORDER — ONDANSETRON HCL 4 MG PO TABS: 4.0000 mg | ORAL_TABLET | Freq: Three times a day (TID) | ORAL | 11 refills | Status: DC | PRN

## 2023-02-04 MED ORDER — DICYCLOMINE HCL 10 MG PO CAPS: 10.0000 mg | ORAL_CAPSULE | Freq: Three times a day (TID) | ORAL | 11 refills | Status: DC

## 2023-02-04 MED ORDER — ONDANSETRON HCL 8 MG PO TABS: ORAL_TABLET | ORAL | 0 refills | Status: DC

## 2023-02-04 NOTE — Progress Notes (Signed)
Assessment     Chronic, intermittent nausea and vomiting -etiology unclear, R/O gastroparesis  Personal history of multiple adenomatous colon polyps  GERD, small hiatal hernia  Diverticulosis   Recommendations    Zofran 4 mg tid prn AC and dicyclomine 10 mg tid prn AC Reschedule colonoscopy with a MiraLAX bowel prep with Zofran 8 mg 30 to 60 minutes prior to each dose. The risks (including bleeding, perforation, infection, missed lesions, medication reactions and possible hospitalization or surgery if complications occur), benefits, and alternatives to colonoscopy with possible biopsy and possible polypectomy were discussed with the patient and they consent to proceed.   Continue pantoprazole 40 mg daily and follow antireflux measures Schedule GES following colonoscopy   HPI    This is a 67 year old female returning for follow-up of chronic nausea and vomiting.  After starting bowel prep in June she developed significant nausea, vomiting and then diarrhea. She presented to the ED for evaluation.  Blood work and CT findings below.  Her symptoms promptly resolved following the ED evaluation.  She has had ongoing problems with intermittent postprandial nausea and vomiting sometimes associated with abdominal bloating and discomfort.  Her reflux symptoms are controlled on daily pantoprazole.  Colonoscopy May 2021 - Three 10 to 15 mm polyps in the sigmoid colon and in the transverse colon, removed with a hot snare. Resected and retrieved.  - One 6 mm polyp in the transverse colon, removed with a cold snare. Complete resection. Polyp tissue not retrieved.  - One 7 mm polyp in the rectum, removed with a cold snare. Resected and retrieved. Clips (MR conditional) were placed.  - Moderate diverticulosis in the left colon.  - Mild diverticulosis in the right colon.  - The examination was otherwise normal on direct and retroflexion views.   EGD May 2022 - Normal esophagus.  - Erythematous  mucosa in the stomach. Biopsied.  - Small hiatal hernia.  - Normal duodenal bulb and second portion of the duodenum.   Labs / Imaging       Latest Ref Rng & Units 12/06/2022    9:14 PM 12/06/2022    5:13 PM 12/03/2022   12:29 AM  Hepatic Function  Total Protein 6.5 - 8.1 g/dL 8.4  7.6  8.4   Albumin 3.5 - 5.0 g/dL 4.2  4.1  4.7   AST 15 - 41 U/L 23  22  29    ALT 0 - 44 U/L 21  19  21    Alk Phosphatase 38 - 126 U/L 81  80  97   Total Bilirubin 0.3 - 1.2 mg/dL 1.2  1.1  0.8        Latest Ref Rng & Units 12/06/2022    7:43 PM 12/06/2022    5:13 PM 12/05/2022    3:05 AM  CBC  WBC 4.0 - 10.5 K/uL 15.3  14.2  16.6   Hemoglobin 12.0 - 15.0 g/dL 09.8  11.9  14.7   Hematocrit 36.0 - 46.0 % 44.5  42.0  39.6   Platelets 150 - 400 K/uL 355  364  335      CT ABDOMEN PELVIS W CONTRAST CLINICAL DATA:  Nonlocalized abdominal pain, vomiting  EXAM: CT ABDOMEN AND PELVIS WITH CONTRAST  TECHNIQUE: Multidetector CT imaging of the abdomen and pelvis was performed using the standard protocol following bolus administration of intravenous contrast.  RADIATION DOSE REDUCTION: This exam was performed according to the departmental dose-optimization program which includes automated exposure control, adjustment of the  mA and/or kV according to patient size and/or use of iterative reconstruction technique.  CONTRAST:  75mL OMNIPAQUE IOHEXOL 350 MG/ML SOLN  COMPARISON:  10/09/2020  FINDINGS: Lower chest: Focal pulmonary infiltrate within the medial basal segment of the right lower lobe may be infectious or inflammatory in nature. Subsegmental atelectasis within the medial right middle lobe. Extensive coronary artery calcification. Global cardiac size within normal limits.  Hepatobiliary: Mild hepatic steatosis. No enhancing intrahepatic mass. Status post cholecystectomy. Mild intra and extrahepatic biliary ductal dilation likely reflects post cholecystectomy change.  Pancreas:  Unremarkable  Spleen: Unremarkable  Adrenals/Urinary Tract: The adrenal glands are unremarkable. The kidneys are normal in size and position. There is moderate right hydronephrosis and dilation of the right renal pelvis with decompression of the right ureter in keeping with a moderate right UPJ obstruction, similar to prior examination. Multiple simple cortical cysts are seen within the left kidney for which no follow-up imaging is recommended. No enhancing intrarenal mass. No perinephric fluid collection. Excreted contrast opacifies the renal collecting systems bilaterally. Bladder unremarkable.  Stomach/Bowel: Severe descending and sigmoid colonic diverticulosis. The stomach, small bowel, and large bowel are otherwise unremarkable. No evidence of obstruction or focal inflammation. No free intraperitoneal gas or fluid. The appendix is normal.  Vascular/Lymphatic: Aortic atherosclerosis. No enlarged abdominal or pelvic lymph nodes.  Reproductive: Involuted calcified fibroids noted within the uterus. The pelvic organs are otherwise unremarkable.  Other: Tiny fat containing umbilical hernia.  Musculoskeletal: No acute bone abnormality. No lytic or blastic bone lesion. Degenerative changes are seen within the lumbar spine with grade 1 anterolisthesis L4-5.  IMPRESSION: 1. No acute intra-abdominal pathology identified. No definite radiographic explanation for the patient's reported symptoms. 2. Focal pulmonary infiltrate within the medial basal segment of the right lower lobe, possibly infectious or inflammatory in nature. 3. Extensive coronary artery calcification. 4. Mild hepatic steatosis. 5. Severe distal colonic diverticulosis without superimposed acute inflammatory change. 6. Aortic atherosclerosis.  Aortic Atherosclerosis (ICD10-I70.0).  Electronically Signed   By: Helyn Numbers M.D.   On: 12/03/2022 02:51  Current Medications, Allergies, Past Medical History, Past  Surgical History, Family History and Social History were reviewed in [employer] record.   Physical Exam: General: Well developed, thin, no acute distress Head: Normocephalic and atraumatic Eyes: Sclerae anicteric, EOMI Ears: Normal auditory acuity Mouth: No deformities or lesions noted Lungs: Clear throughout to auscultation Heart: Regular rate and rhythm; No murmurs, rubs or bruits Abdomen: Soft, non tender and non distended. No masses, hepatosplenomegaly or hernias noted. Normal Bowel sounds Rectal: Not done Musculoskeletal: Symmetrical with no gross deformities  Pulses:  Normal pulses noted Extremities: No edema or deformities noted Neurological: Alert oriented x 4, grossly nonfocal Psychological:  Alert and cooperative. Normal mood and affect   Tecla Mailloux T. Russella Dar, MD 02/04/2023, 10:23 AM

## 2023-02-04 NOTE — Patient Instructions (Addendum)
_______________________________________________________  If your blood pressure at your visit was 140/90 or greater, please contact your primary care physician to follow up on this.  _______________________________________________________  If you are age 68 or older, your body mass index should be between 23-30. Your Body mass index is 20.71 kg/m. If this is out of the aforementioned range listed, please consider follow up with your Primary Care Provider.  If you are age 44 or younger, your body mass index should be between 19-25. Your Body mass index is 20.71 kg/m. If this is out of the aformentioned range listed, please consider follow up with your Primary Care Provider.   ________________________________________________________  The [facility] providers would like to encourage you to use [facility] to communicate with providers for non-urgent requests or questions.  Due to long hold times on the telephone, sending your provider a message by [facility] may be a faster and more efficient way to get a response.  Please allow 48 business hours for a response.  Please remember that this is for non-urgent requests.   We have sent the following medications to your pharmacy for you to pick up at your convenience: Zofran 8 mg, We aslo sent in your 4 mg Zofran as well Bentyl _______________________________________________________   [name] have been scheduled for a colonoscopy. Please follow written instructions given to you at your visit today.   Please pick up your prep supplies at the pharmacy within the next 1-3 days.  If you use inhalers (even only as needed), please bring them with you on the day of your procedure.  DO NOT TAKE 7 DAYS PRIOR TO TEST- Trulicity (dulaglutide) Ozempic, Wegovy (semaglutide) Mounjaro (tirzepatide) Bydureon Bcise (exanatide extended release)  DO NOT TAKE 1 DAY PRIOR TO YOUR TEST Rybelsus (semaglutide) Adlyxin (lixisenatide) Victoza (liraglutide) Byetta  (exanatide) ___________________________________________________________________________   Due to recent changes in healthcare laws, you may see the results of your imaging and laboratory studies on MyChart before your provider has had a chance to review them.  We understand that in some cases there may be results that are confusing or concerning to you. Not all laboratory results come back in the same time frame and the provider may be waiting for multiple results in order to interpret others.  Please give [name] 48 hours in order for your provider to thoroughly review all the results before contacting the office for clarification of your results.    Thank you for entrusting me with your care and choosing [facility].  Dr Russella Dar

## 2023-02-09 ENCOUNTER — Encounter: Payer: Self-pay | Admitting: Certified Registered Nurse Anesthetist

## 2023-02-13 ENCOUNTER — Ambulatory Visit (AMBULATORY_SURGERY_CENTER): Payer: Medicare Other | Admitting: Gastroenterology

## 2023-02-13 ENCOUNTER — Encounter: Payer: Self-pay | Admitting: Gastroenterology

## 2023-02-13 VITALS — BP 152/95 | HR 107 | Temp 98.4°F | Resp 26 | Ht 61.0 in | Wt 111.0 lb

## 2023-02-13 DIAGNOSIS — Z86010 Personal history of colonic polyps: Secondary | ICD-10-CM | POA: Diagnosis not present

## 2023-02-13 DIAGNOSIS — Z09 Encounter for follow-up examination after completed treatment for conditions other than malignant neoplasm: Secondary | ICD-10-CM | POA: Diagnosis not present

## 2023-02-13 DIAGNOSIS — K635 Polyp of colon: Secondary | ICD-10-CM | POA: Diagnosis not present

## 2023-02-13 DIAGNOSIS — D128 Benign neoplasm of rectum: Secondary | ICD-10-CM

## 2023-02-13 DIAGNOSIS — K621 Rectal polyp: Secondary | ICD-10-CM | POA: Diagnosis not present

## 2023-02-13 MED ORDER — SODIUM CHLORIDE 0.9 % IV SOLN
500.0000 mL | Freq: Once | INTRAVENOUS | Status: DC
Start: 2023-02-13 — End: 2023-02-13

## 2023-02-13 NOTE — Progress Notes (Signed)
Pt's states no medical or surgical changes since previsit or office visit. 

## 2023-02-13 NOTE — Patient Instructions (Signed)
Handouts provided about high fiber diet, diverticulosis and polyps.  Repeat colonoscopy, likely 5 years, after studies are complete for surveillance based on pathology results.  High fiber diet.  Continue present medications.  Await pathology results.    YOU HAD AN ENDOSCOPIC PROCEDURE TODAY AT THE [facility]:   Refer to the procedure report that was given to you for any specific questions about what was found during the examination.  If the procedure report does not answer your questions, please call your gastroenterologist to clarify.  If you requested that your care partner not be given the details of your procedure findings, then the procedure report has been included in a sealed envelope for you to review at your convenience later.  YOU SHOULD EXPECT: Some feelings of bloating in the abdomen. Passage of more gas than usual.  Walking can help get rid of the air that was put into your GI tract during the procedure and reduce the bloating. If you had a lower endoscopy (such as a colonoscopy or flexible sigmoidoscopy) you may notice spotting of blood in your stool or on the toilet paper. If you underwent a bowel prep for your procedure, you may not have a normal bowel movement for a few days.  Please Note:  You might notice some irritation and congestion in your nose or some drainage.  This is from the oxygen used during your procedure.  There is no need for concern and it should clear up in a day or so.  SYMPTOMS TO REPORT IMMEDIATELY:  Following lower endoscopy (colonoscopy or flexible sigmoidoscopy):  Excessive amounts of blood in the stool  Significant tenderness or worsening of abdominal pains  Swelling of the abdomen that is new, acute  Fever of 100F or higher  For urgent or emergent issues, a gastroenterologist can be reached at any hour by calling [phone]. Do not use MyChart messaging for urgent concerns.    DIET:  We do recommend a small meal at first, but then you  may proceed to your regular diet.  Drink plenty of fluids but you should avoid alcoholic beverages for 24 hours.  ACTIVITY:  You should plan to take it easy for the rest of today and you should NOT DRIVE or use heavy machinery until tomorrow (because of the sedation medicines used during the test).    FOLLOW UP: Our staff will call the number listed on your records the next business day following your procedure.  We will call around 7:15- 8:00 am to check on you and address any questions or concerns that you may have regarding the information given to you following your procedure. If we do not reach you, we will leave a message.     If any biopsies were taken you will be contacted by phone or by letter within the next 1-3 weeks.  Please call us at [phone] if you have not heard about the biopsies in 3 weeks.    SIGNATURES/CONFIDENTIALITY: You and/or your care partner have signed paperwork which will be entered into your electronic medical record.  These signatures attest to the fact that that the information above on your After Visit Summary has been reviewed and is understood.  Full responsibility of the confidentiality of this discharge information lies with you and/or your care-partner.

## 2023-02-13 NOTE — Progress Notes (Signed)
1038 Robinul 0.2 mg IV given due large amount of secretions upon assessment.  MD made aware, vss

## 2023-02-13 NOTE — Progress Notes (Signed)
See 02/04/2023 H&P no changes

## 2023-02-13 NOTE — Progress Notes (Signed)
1030 Patient experiencing nausea and vomiting.  Zofran 4 mg IV given. BP 161/109, Labetalol given IV, MD update, vss

## 2023-02-13 NOTE — Progress Notes (Signed)
Report given to PACU, vss 

## 2023-02-13 NOTE — Progress Notes (Signed)
Called to room to assist during endoscopic procedure.  Patient ID and intended procedure confirmed with present staff. Received instructions for my participation in the procedure from the performing physician.  

## 2023-02-13 NOTE — Op Note (Signed)
[facility] Patient Name: [name] Procedure Date: 02/13/2023 10:21 AM MRN: [id number] Endoscopist: Meryl Dare , MD, [phone] Age: 67 Referring MD:  Date of Birth: [birth year] Gender: Female Account #: [id number] Procedure:                Colonoscopy Indications:              Surveillance: Personal history of multiple                            adenomatous polyps on last colonoscopy 3 years ago Medicines:                Monitored Anesthesia Care Procedure:                Pre-Anesthesia Assessment:                           - Prior to the procedure, a History and Physical                            was performed, and patient medications and                            allergies were reviewed. The patient's tolerance of                            previous anesthesia was also reviewed. The risks                            and benefits of the procedure and the sedation                            options and risks were discussed with the patient.                            All questions were answered, and informed consent                            was obtained. Prior Anticoagulants: The patient has                            taken no anticoagulant or antiplatelet agents. ASA                            Grade Assessment: III - A patient with severe                            systemic disease. After reviewing the risks and                            benefits, the patient was deemed in satisfactory                            condition to undergo the procedure.  After obtaining informed consent, the colonoscope                            was passed under direct vision. Throughout the                            procedure, the patient's blood pressure, pulse, and                            oxygen saturations were monitored continuously. The                            Olympus CF-HQ190L ([id number]) Colonoscope was                            introduced  through the anus and advanced to the the                            cecum, identified by appendiceal orifice and                            ileocecal valve. The ileocecal valve, appendiceal                            orifice, and rectum were photographed. The quality                            of the bowel preparation was good. The colonoscopy                            was performed without difficulty. The patient                            tolerated the procedure well. Scope In: 10:26:35 AM Scope Out: 10:43:22 AM Scope Withdrawal Time: 0 hours 10 minutes 36 seconds  Total Procedure Duration: 0 hours 16 minutes 47 seconds  Findings:                 The perianal and digital rectal examinations were                            normal.                           Scattered small-mouthed diverticula were found in                            the right colon. There was no evidence of                            diverticular bleeding.                           Multiple medium-mouthed and small-mouthed  diverticula were found in the left colon. There was                            no evidence of diverticular bleeding.                           A 6 mm polyp was found in the mid rectum. The polyp                            was sessile. The polyp was removed with a cold                            snare. Resection and retrieval were complete.                           The exam was otherwise without abnormality on                            direct views. Retrolexion not performed due to a                            narrow rectal vault. Complications:            No immediate complications. Estimated blood loss:                            None. Estimated Blood Loss:     Estimated blood loss: none. Impression:               - Mild diverticulosis in the right colon.                           - Moderate diverticulosis in the left colon.                           - One 6 mm polyp in  the mid rectum, removed with a                            cold snare. Resected and retrieved.                           - The examination was otherwise normal on direct                            and retroflexion views. Recommendation:           - Repeat colonoscopy, likely 5 years, after studies                            are complete for surveillance based on pathology                            results.                           -  Patient has a contact number available for                            emergencies. The signs and symptoms of potential                            delayed complications were discussed with the                            patient. Return to normal activities tomorrow.                            Written discharge instructions were provided to the                            patient.                           - High fiber diet.                           - Continue present medications.                           - Await pathology results. Meryl Dare, MD 02/13/2023 10:49:23 AM This report has been signed electronically.

## 2023-02-14 ENCOUNTER — Telehealth: Payer: Self-pay

## 2023-02-14 NOTE — Telephone Encounter (Signed)
  Follow up Call-     02/13/2023   10:07 AM 10/27/2020    8:49 AM  Call back number  Post procedure Call Back phone  # [phone]  Permission to leave phone message Yes Yes     Patient questions:  Do you have a fever, pain , or abdominal swelling? No. Pain Score  0 *  Have you tolerated food without any problems? Yes.    Have you been able to return to your normal activities? Yes.    Do you have any questions about your discharge instructions: Diet   No. Medications  No. Follow up visit  No.  Do you have questions or concerns about your Care? No.  Actions: * If pain score is 4 or above: No action needed, pain <4.

## 2023-02-18 ENCOUNTER — Encounter: Payer: Self-pay | Admitting: Podiatry

## 2023-02-18 ENCOUNTER — Ambulatory Visit: Payer: Medicare Other | Admitting: Podiatry

## 2023-02-18 DIAGNOSIS — E119 Type 2 diabetes mellitus without complications: Secondary | ICD-10-CM | POA: Diagnosis not present

## 2023-02-18 DIAGNOSIS — L84 Corns and callosities: Secondary | ICD-10-CM

## 2023-02-18 DIAGNOSIS — G629 Polyneuropathy, unspecified: Secondary | ICD-10-CM

## 2023-02-18 NOTE — Progress Notes (Signed)
  Subjective:  Patient ID: [name], female    DOB: 1956/03/26,   MRN: [id number]  Chief Complaint  Patient presents with   Foot Pain    Pt present today with left foot pain and numbness on the bottom of her left foot.    67 y.o. female presents for concern of continue numbness in the feet. Relates today not doing so bad.   Relates burning and tingling in their feet. Relates hallux on right is still doing well. Also hoping to have callus trimmed as well. Patient is diabetic and last A1c was  Lab Results  Component Value Date   HGBA1C 7.5 (H) 12/03/2022   .   PCP:  [name], NP    . Denies any other pedal complaints. Denies n/v/f/c.   Past Medical History:  Diagnosis Date   Anemia    hx   Aneurysm (HCC)    small, on left side of brain    Anxiety    hx of   Arthritis    generalized   Diabetes mellitus without complication (HCC)    diet control, pt denies- on medications   GERD (gastroesophageal reflux disease)    iwht certain foods/on meds/OTC PRN meds   Hyperlipidemia    on meds   Hypertension    on meds   TIA (transient ischemic attack)    pt unaware of this hx on 06/19/2017    Objective:  Physical Exam: Vascular: DP/PT pulses 2/4 bilateral. CFT <3 seconds. Absent hair growth on digits. Edema noted to bilateral lower extremities. Xerosis noted bilaterally.  Skin. No lacerations or abrasions bilateral feet. Nails 1-5 bilateral  are thickened discolored and  with subungual debris. Hyperkeratotic tissue noted left medial hallux.    Musculoskeletal: MMT 5/5 bilateral lower extremities in DF, PF, Inversion and Eversion. Deceased ROM in DF of ankle joint. 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 Tender to hallux IPJ. No pain around MPJ or lesser digits.  Neurological: Sensation intact to light touch. Protective sensation diminished bilateral.    Assessment:   1. Corns and callosities   2. Diabetes mellitus type 2 in nonobese (HCC)   3.  Neuropathy       Plan:  Patient was evaluated and treated and all questions answered. Discussed neuropathy and etiology as well as treatment with patient.  Radiographs reviewed and discussed with patient. No acute fractures or dislocations. Moderate bunion deformity on right and recurrence of bunion on left with retained hardware.  -Discussed and educated patient on diabetic foot care, especially with  regards to the vascular, neurological and musculoskeletal systems.  -Stressed the importance of good glycemic control and the detriment of not  controlling glucose levels in relation to the foot. -Discussed supportive shoes at all times and checking feet regularly.  -Hyperkeratotic tissue debrided without incident.  -Patient to return in 4 months for rfc. Louann Sjogren, DPM

## 2023-02-27 ENCOUNTER — Ambulatory Visit (INDEPENDENT_AMBULATORY_CARE_PROVIDER_SITE_OTHER): Payer: Medicare Other | Admitting: Podiatry

## 2023-02-27 DIAGNOSIS — Z91199 Patient's noncompliance with other medical treatment and regimen due to unspecified reason: Secondary | ICD-10-CM

## 2023-02-27 NOTE — Progress Notes (Signed)
No show

## 2023-02-28 ENCOUNTER — Encounter: Payer: Self-pay | Admitting: Gastroenterology

## 2023-03-05 ENCOUNTER — Ambulatory Visit (INDEPENDENT_AMBULATORY_CARE_PROVIDER_SITE_OTHER): Payer: Medicare Other | Admitting: Primary Care

## 2023-03-05 ENCOUNTER — Encounter (HOSPITAL_COMMUNITY): Payer: Self-pay | Admitting: *Deleted

## 2023-03-05 ENCOUNTER — Other Ambulatory Visit (INDEPENDENT_AMBULATORY_CARE_PROVIDER_SITE_OTHER): Payer: Self-pay

## 2023-03-05 ENCOUNTER — Other Ambulatory Visit: Payer: Self-pay

## 2023-03-05 ENCOUNTER — Emergency Department (HOSPITAL_COMMUNITY)
Admission: EM | Admit: 2023-03-05 | Discharge: 2023-03-05 | Disposition: A | Discharge status: Home / Self Care | Payer: Medicare Other | Attending: Emergency Medicine | Admitting: Emergency Medicine

## 2023-03-05 DIAGNOSIS — E1165 Type 2 diabetes mellitus with hyperglycemia: Secondary | ICD-10-CM | POA: Insufficient documentation

## 2023-03-05 DIAGNOSIS — R739 Hyperglycemia, unspecified: Secondary | ICD-10-CM | POA: Diagnosis present

## 2023-03-05 DIAGNOSIS — Z7984 Long term (current) use of oral hypoglycemic drugs: Secondary | ICD-10-CM | POA: Insufficient documentation

## 2023-03-05 DIAGNOSIS — Z79899 Other long term (current) drug therapy: Secondary | ICD-10-CM | POA: Diagnosis not present

## 2023-03-05 DIAGNOSIS — I10 Essential (primary) hypertension: Secondary | ICD-10-CM | POA: Insufficient documentation

## 2023-03-05 DIAGNOSIS — R519 Headache, unspecified: Secondary | ICD-10-CM | POA: Insufficient documentation

## 2023-03-05 DIAGNOSIS — E119 Type 2 diabetes mellitus without complications: Secondary | ICD-10-CM

## 2023-03-05 LAB — I-STAT CHEM 8, ED
BUN: 13 mg/dL (ref 8–23)
Calcium, Ion: 1.18 mmol/L (ref 1.15–1.40)
Chloride: 106 mmol/L (ref 98–111)
Creatinine, Ser: 0.5 mg/dL (ref 0.44–1.00)
Glucose, Bld: 142 mg/dL — ABNORMAL HIGH (ref 70–99)
HCT: 41 % (ref 36.0–46.0)
Hemoglobin: 13.9 g/dL (ref 12.0–15.0)
Potassium: 3.3 mmol/L — ABNORMAL LOW (ref 3.5–5.1)
Sodium: 142 mmol/L (ref 135–145)
TCO2: 22 mmol/L (ref 22–32)

## 2023-03-05 LAB — CBG MONITORING, ED: Glucose-Capillary: 159 mg/dL — ABNORMAL HIGH (ref 70–99)

## 2023-03-05 MED ORDER — SITAGLIPTIN PHOSPHATE 25 MG PO TABS: 25.0000 mg | ORAL_TABLET | Freq: Every day | ORAL | 1 refills | Status: DC

## 2023-03-05 NOTE — ED Notes (Signed)
Patient Alert and oriented to baseline. Stable and ambulatory to baseline. Patient verbalized understanding of the discharge instructions.  Patient belongings were taken by the patient.   

## 2023-03-05 NOTE — ED Notes (Signed)
Pt able to tolerate ice chips without issue.

## 2023-03-05 NOTE — Discharge Instructions (Signed)
You were evaluated here in the emergency department and your blood sugar was slightly elevated at 144.  There is no evidence of acute complications from having high blood sugar. A prescription for your Januvia was sent to your [facility] pharmacy Your potassium was slightly low at 3.3 Please eat potassium rich foods. Please follow-up with your doctor next week and have your blood sugar and potassium rechecked Return to the emergency department if you are having any new or worsening symptoms at any time

## 2023-03-05 NOTE — ED Provider Notes (Signed)
[place] EMERGENCY DEPARTMENT AT [facility] Provider Note   CSN: [id number] Arrival date & time: 03/05/23  1003     History  Chief Complaint  Patient presents with   Blood Sugar Problem    [name] is a 67 y.o. female.  HPI 67 year old female history of hypertension, diabetes, presents today complaining of hyperglycemia.  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  She reports taking her other medications.  States that her blood sugar has been running high with the highest at 222.  She had some headache with vomiting today and presents secondary to concerns of her high blood sugar.  She denies fever, chills, chest pain, cough, dyspnea, abdominal pain, urinary frequency.     Home Medications Prior to Admission medications   Medication Sig Start Date End Date Taking? Authorizing Provider  amLODipine (NORVASC) 10 MG tablet TAKE ONE TABLET (10mg  total) BY MOUTH DAILY AT 9AM 12/20/22   [name], NP  dicyclomine (BENTYL) 10 MG capsule Take 1 capsule (10 mg total) by mouth 3 (three) times daily before meals. 02/04/23   Meryl Dare, MD  DULoxetine (CYMBALTA) 30 MG capsule TAKE ONE CAPSULE BY MOUTH TWICE DAILY @ 9AM & 5PM 12/20/22   [name], NP  loperamide (IMODIUM) 2 MG capsule Take 1 capsule (2 mg total) by mouth 4 (four) times daily as needed for diarrhea or loose stools. 12/06/22   Jeanelle Malling, PA  losartan-hydrochlorothiazide (HYZAAR) 100-25 MG tablet TAKE ONE TABLET BY MOUTH DAILY AT 9AM 12/20/22   [name], NP  methocarbamol (ROBAXIN) 500 MG tablet Take 1 tablet (500 mg total) by mouth every 8 (eight) hours as needed for muscle spasms. 06/14/22   Debby Freiberg, NP  metoprolol succinate (TOPROL-XL) 25 MG 24 hr tablet TAKE ONE TABLET (25MG  TOTAL) BY MOUTH DAILY AT 9AM 01/16/23   Tolia, Sunit, DO  ondansetron (ZOFRAN) 4 MG tablet Take 1 tablet (4 mg  total) by mouth every 8 (eight) hours as needed for nausea or vomiting. 02/04/23   Meryl Dare, MD  ondansetron (ZOFRAN) 8 MG tablet Take one prior to each prep dose. 02/04/23   Meryl Dare, MD  oxyCODONE-acetaminophen (PERCOCET/ROXICET) 5-325 MG tablet Take 1 tablet by mouth every 6 (six) hours as needed for moderate pain or severe pain. 10/17/22   [provider]  pantoprazole (PROTONIX) 40 MG tablet TAKE ONE TABLET (40mg  total) BY MOUTH DAILY AT 9AM Patient taking differently: Take 40 mg by mouth daily as needed (for heartburn). 06/20/22   Meryl Dare, MD  potassium chloride SA (KLOR-CON M) 20 MEQ tablet Take 2 tablets (40 mEq total) by mouth daily for 3 days. 12/05/22 12/08/22  Burnadette Pop, MD  pregabalin (LYRICA) 50 MG capsule Take 50 mg by mouth 3 (three) times daily. 04/30/22   [provider]  rosuvastatin (CRESTOR) 40 MG tablet Take 1 tablet (40 mg total) by mouth daily. 04/09/22   [name], NP  sitaGLIPtin (JANUVIA) 25 MG tablet Take 1 tablet (25 mg total) by mouth daily. 03/05/23   Margarita Grizzle, MD  traMADol (ULTRAM) 50 MG tablet Take 50 mg by mouth every 6 (six) hours as needed. 01/09/23   [provider]      Allergies    Patient has no known allergies.    Review of Systems   Review of Systems  Physical Exam Updated  Vital Signs BP (!) 163/95   Pulse 83   Temp 98.2 F (36.8 C) (Oral)   Resp 18   Ht 1.549 m (5\' 1" )   Wt 50.3 kg   SpO2 94%   BMI 20.97 kg/m  Physical Exam Vitals reviewed.  Constitutional:      Appearance: Normal appearance.  HENT:     Head: Normocephalic.     Right Ear: External ear normal.     Left Ear: External ear normal.     Nose: Nose normal.     Mouth/Throat:     Pharynx: Oropharynx is clear.  Eyes:     Extraocular Movements: Extraocular movements intact.     Pupils: Pupils are equal, round, and reactive to light.  Cardiovascular:     Rate and Rhythm: Normal rate and regular rhythm.      Pulses: Normal pulses.  Pulmonary:     Effort: Pulmonary effort is normal.     Breath sounds: Normal breath sounds.  Abdominal:     General: Abdomen is flat. Bowel sounds are normal.     Palpations: Abdomen is soft.  Musculoskeletal:        General: Normal range of motion.     Cervical back: Normal range of motion.  Skin:    General: Skin is warm and dry.     Capillary Refill: Capillary refill takes less than 2 seconds.  Neurological:     General: No focal deficit present.     Mental Status: She is alert.  Psychiatric:        Mood and Affect: Mood normal.     ED Results / Procedures / Treatments   Labs (all labs ordered are listed, but only abnormal results are displayed) Labs Reviewed  CBG MONITORING, ED - Abnormal; Notable for the following components:      Result Value   Glucose-Capillary 159 (*)    All other components within normal limits  I-STAT CHEM 8, ED - Abnormal; Notable for the following components:   Potassium 3.3 (*)    Glucose, Bld 142 (*)    All other components within normal limits    EKG None  Radiology No results found.  Procedures Procedures    Medications Ordered in ED Medications - No data to display  ED Course/ Medical Decision Making/ A&P Clinical Course as of 03/05/23 1301  Tue Mar 05, 2023  1258 I-STAT reviewed interpreted simper sodium 142 potassium 3.3 glucose elevated at 142 normal kidney function normal calcium normal hemoglobin hematocrit [DR]    Clinical Course User Index [DR] Margarita Grizzle, MD                                 Medical Decision Making  Patient presents today with complaints of hyperglycemia.  She has been noncompliant with her medications due to household and caregiver stresses.  She had some headache and vomiting today.  She is evaluated here for hyperglycemia headache and vomiting. Differential diagnosis includes but is not limited to DKA Hyperglycemia and other hyperglycemic crises including hyperosmotic  hyperglycemia Other electrolyte abnormalities Infection Acute intracranial etiologies Patient was evaluated here with labs and has some hyperglycemia but no evidence of dka  Patient has normal neurological exam and headache does not appear to be severe.  She has some hypertensive here.  Does suspect this is due to situational anxiety. She has not restarted her Januvia because it is out of date.  She  is requesting new prescription. Patient will have her Januvia restarted.  She is advised regarding return precautions and need for follow-up and voiced understanding.        Final Clinical Impression(s) / ED Diagnoses Final diagnoses:  Hyperglycemia    Rx / DC Orders ED Discharge Orders          Ordered    sitaGLIPtin (JANUVIA) 25 MG tablet  Daily        03/05/23 1300              Margarita Grizzle, MD 03/05/23 1301

## 2023-03-05 NOTE — ED Triage Notes (Signed)
Pt has "been out of blood sugar medicine for several months." Tried to get a prescription yesterday ans could not. Pt vomiting and headache and feeling sick

## 2023-03-06 ENCOUNTER — Telehealth (INDEPENDENT_AMBULATORY_CARE_PROVIDER_SITE_OTHER): Payer: Self-pay | Admitting: Primary Care

## 2023-03-06 NOTE — Telephone Encounter (Signed)
Will forward to provider  

## 2023-03-06 NOTE — Telephone Encounter (Signed)
Copied from CRM [id number]. Topic: General - Other >> Mar 06, 2023  9:36 AM Everette C wrote: Reason for CRM: The patient has called to follow up on a previous request for an alternative prescription for sitaGLIPtin (JANUVIA) 25 MG tablet [[id number]]  The patient shares that the out of pocket cost of the prescription would be more than $400   Please contact further when possible

## 2023-03-07 NOTE — Telephone Encounter (Signed)
Called patient back asked her to call her pharmacy ask what her insurance would cover in the same class of medication and call to inform writer and will send it in.

## 2023-03-08 ENCOUNTER — Other Ambulatory Visit (INDEPENDENT_AMBULATORY_CARE_PROVIDER_SITE_OTHER): Payer: Self-pay | Admitting: Primary Care

## 2023-03-08 ENCOUNTER — Telehealth (INDEPENDENT_AMBULATORY_CARE_PROVIDER_SITE_OTHER): Payer: Self-pay | Admitting: Primary Care

## 2023-03-08 ENCOUNTER — Other Ambulatory Visit (INDEPENDENT_AMBULATORY_CARE_PROVIDER_SITE_OTHER): Payer: Self-pay

## 2023-03-08 MED ORDER — DAPAGLIFLOZIN PROPANEDIOL 5 MG PO TABS: 5.0000 mg | ORAL_TABLET | Freq: Every day | ORAL | 1 refills | Status: DC

## 2023-03-08 NOTE — Telephone Encounter (Signed)
Error

## 2023-03-11 ENCOUNTER — Telehealth (INDEPENDENT_AMBULATORY_CARE_PROVIDER_SITE_OTHER): Payer: Self-pay | Admitting: Primary Care

## 2023-03-11 NOTE — Telephone Encounter (Signed)
Will forward to provider  

## 2023-03-11 NOTE — Telephone Encounter (Signed)
Pt came in the office this morning was wondering if she call in the blood pressure meds she still having symptoms.

## 2023-03-12 ENCOUNTER — Ambulatory Visit (INDEPENDENT_AMBULATORY_CARE_PROVIDER_SITE_OTHER): Payer: Medicare Other | Admitting: Primary Care

## 2023-04-22 ENCOUNTER — Ambulatory Visit (HOSPITAL_COMMUNITY)
Admission: EM | Admit: 2023-04-22 | Discharge: 2023-04-22 | Disposition: A | Discharge status: Home / Self Care | Payer: Medicare Other | Attending: Emergency Medicine | Admitting: Emergency Medicine

## 2023-04-22 ENCOUNTER — Encounter (HOSPITAL_COMMUNITY): Payer: Self-pay

## 2023-04-22 DIAGNOSIS — I10 Essential (primary) hypertension: Secondary | ICD-10-CM | POA: Diagnosis not present

## 2023-04-22 DIAGNOSIS — J069 Acute upper respiratory infection, unspecified: Secondary | ICD-10-CM

## 2023-04-22 MED ORDER — PROMETHAZINE-DM 6.25-15 MG/5ML PO SYRP: 2.5000 mL | ORAL_SOLUTION | Freq: Four times a day (QID) | ORAL | 0 refills | Status: DC | PRN

## 2023-04-22 NOTE — Discharge Instructions (Addendum)
Please take your amlodipine and Hyzaar when you get home. Continue taking daily If you develop headache, dizziness, vision changes, unilateral weakness, chest or abdominal pain, shortness of breath, please go directly to the emergency department  Tylenol for body aches The promethazine DM cough syrup can be used up to 4 times daily. If this medication makes you drowsy, take only once before bed.  Please return if needed

## 2023-04-22 NOTE — ED Triage Notes (Signed)
Patient here today with c/o cough X 4 days and body aches X 1 day. She ahs been using a cough syrup but not helping and Tylenol with no relief. No sick contacts.

## 2023-04-22 NOTE — ED Provider Notes (Signed)
[facility]    CSN: [id number] Arrival date & time: 04/22/23  0803     History   Chief Complaint Chief Complaint  Patient presents with   Cough    HPI [name] is a 67 y.o. female.  4 day history of semi-productive cough Today developed body aches Not having fever or chills. No congestion, shortness of breath, wheezing Possible sick contacts No medications yet  Elevated blood pressure, hx of HTN. She was off her medications for about 1 week. Took yesterday, but not today. Has been under increased stress recently, lost her mother this past month.  She is not having headache, dizziness, vision changes, chest pain or tightness, abdominal pain, weakness. History of brain aneurysm, TIA, DM  Past Medical History:  Diagnosis Date   Anemia    hx   Aneurysm (HCC)    small, on left side of brain    Anxiety    hx of   Arthritis    generalized   Diabetes mellitus without complication (HCC)    diet control, pt denies- on medications   GERD (gastroesophageal reflux disease)    iwht certain foods/on meds/OTC PRN meds   Hyperlipidemia    on meds   Hypertension    on meds   TIA (transient ischemic attack)    pt unaware of this hx on 06/19/2017    Patient Active Problem List   Diagnosis Date Noted   Intractable nausea and vomiting 12/03/2022   Prolonged QT interval 12/03/2022   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   Numbness and tingling of left hand 02/03/2021   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   Sepsis due to undetermined organism (HCC) 10/10/2020   Enteritis of infectious origin 10/09/2020   Diabetes mellitus type 2 in nonobese (HCC) 10/09/2020   Bilateral impacted cerumen 04/02/2019   Conductive hearing loss, bilateral 04/02/2019   Nausea and vomiting 05/17/2018   Enteritis 06/19/2017   Abdominal pain 06/18/2017   Hyperlipidemia 06/18/2017   Elevated glucose 06/18/2017   Need for hepatitis C screening test 05/23/2017   Breast pain,  left 01/05/2016   Left ankle sprain 01/20/2015   Gastroenteritis 11/08/2014   Malaise 10/14/2014   Hypokalemia 10/14/2014   Essential hypertension    Bilateral wrist pain 10/06/2014   Hot flashes, menopausal 10/06/2014   Cerebral aneurysm without rupture 08/20/2014   TIA (transient ischemic attack) 08/19/2014   Accelerated hypertension 02/24/2013   Depression 02/24/2013    Past Surgical History:  Procedure Laterality Date   ANTERIOR CERVICAL DECOMP/DISCECTOMY FUSION     BACK SURGERY     CARPAL TUNNEL RELEASE Left 2024   CESAREAN SECTION  1986   FOOT SURGERY Left 11/2019   IR GENERIC HISTORICAL  09/21/2014   IR ANGIO INTRA EXTRACRAN SEL INTERNAL CAROTID UNI L MOD SED 09/21/2014 Lisbeth Renshaw, MD [facility]   IR GENERIC HISTORICAL  09/21/2014   IR ANGIO INTRA EXTRACRAN SEL COM CAROTID INNOMINATE UNI R MOD SED 09/21/2014 Lisbeth Renshaw, MD [facility]   IR GENERIC HISTORICAL  09/21/2014   IR ANGIO VERTEBRAL SEL VERTEBRAL UNI L MOD SED 09/21/2014 Lisbeth Renshaw, MD [facility]   IR GENERIC HISTORICAL  09/21/2014   IR 3D INDEPENDENT WKST 09/21/2014 Lisbeth Renshaw, MD [facility]   LAPAROSCOPIC CHOLECYSTECTOMY     TUBAL LIGATION     TUBAL LIGATION      OB History     Gravida  2   Para  1   Term  1  Preterm      AB  1   Living  1      SAB  1   IAB      Ectopic      Multiple      Live Births  1            Home Medications    Prior to Admission medications   Medication Sig Start Date End Date Taking? Authorizing Provider  promethazine-dextromethorphan (PROMETHAZINE-DM) 6.25-15 MG/5ML syrup Take 2.5 mLs by mouth 4 (four) times daily as needed for cough. 04/22/23  Yes Chihiro Frey, Lurena Joiner, PA-C  amLODipine (NORVASC) 10 MG tablet TAKE ONE TABLET (10mg  total) BY MOUTH DAILY AT 9AM 12/20/22   [name], NP  dapagliflozin propanediol (FARXIGA) 5 MG TABS tablet Take 1 tablet (5 mg total) by mouth daily. 03/08/23   [name], NP   dicyclomine (BENTYL) 10 MG capsule Take 1 capsule (10 mg total) by mouth 3 (three) times daily before meals. 02/04/23   Meryl Dare, MD  DULoxetine (CYMBALTA) 30 MG capsule TAKE ONE CAPSULE BY MOUTH TWICE DAILY @ 9AM & 5PM 12/20/22   [name], NP  loperamide (IMODIUM) 2 MG capsule Take 1 capsule (2 mg total) by mouth 4 (four) times daily as needed for diarrhea or loose stools. 12/06/22   Jeanelle Malling, PA  losartan-hydrochlorothiazide (HYZAAR) 100-25 MG tablet TAKE ONE TABLET BY MOUTH DAILY AT 9AM 12/20/22   [name], NP  methocarbamol (ROBAXIN) 500 MG tablet Take 1 tablet (500 mg total) by mouth every 8 (eight) hours as needed for muscle spasms. 06/14/22   Debby Freiberg, NP  metoprolol succinate (TOPROL-XL) 25 MG 24 hr tablet TAKE ONE TABLET (25MG  TOTAL) BY MOUTH DAILY AT 9AM 01/16/23   Tolia, Sunit, DO  ondansetron (ZOFRAN) 4 MG tablet Take 1 tablet (4 mg total) by mouth every 8 (eight) hours as needed for nausea or vomiting. 02/04/23   Meryl Dare, MD  ondansetron (ZOFRAN) 8 MG tablet Take one prior to each prep dose. 02/04/23   Meryl Dare, MD  oxyCODONE-acetaminophen (PERCOCET/ROXICET) 5-325 MG tablet Take 1 tablet by mouth every 6 (six) hours as needed for moderate pain or severe pain. 10/17/22   [provider]  pantoprazole (PROTONIX) 40 MG tablet TAKE ONE TABLET (40mg  total) BY MOUTH DAILY AT 9AM Patient taking differently: Take 40 mg by mouth daily as needed (for heartburn). 06/20/22   Meryl Dare, MD  potassium chloride SA (KLOR-CON M) 20 MEQ tablet Take 2 tablets (40 mEq total) by mouth daily for 3 days. 12/05/22 12/08/22  Burnadette Pop, MD  pregabalin (LYRICA) 50 MG capsule Take 50 mg by mouth 3 (three) times daily. 04/30/22   [provider]  rosuvastatin (CRESTOR) 40 MG tablet Take 1 tablet (40 mg total) by mouth daily. 04/09/22   [name], NP  traMADol (ULTRAM) 50 MG tablet Take 50 mg by mouth every 6 (six) hours as needed.  01/09/23   [provider]    Family History Family History  Problem Relation Age of Onset   Hypertension Mother    Arthritis Mother    Diabetes Mother    Dementia Mother    Heart attack Father    Heart disease Father    Hypertension Sister    Diabetes Sister    Hypertension Sister    Heart disease Sister    Diabetes Sister    Hypertension Brother    Heart disease Brother  Diabetes Brother    Hypertension Brother    Hypertension Brother    Hypertension Brother    Cancer Neg Hx    Colon cancer Neg Hx    Colon polyps Neg Hx    Esophageal cancer Neg Hx    Rectal cancer Neg Hx    Stomach cancer Neg Hx    Breast cancer Neg Hx     Social History Social History   Tobacco Use   Smoking status: Former    Types: Cigarettes   Smokeless tobacco: Never  Vaping Use   Vaping status: Never Used  Substance Use Topics   Alcohol use: Not Currently    Comment: socailly   Drug use: Yes    Types: Marijuana     Allergies   Patient has no known allergies.   Review of Systems Review of Systems  Respiratory:  Positive for cough.    As per HPI  Physical Exam Triage Vital Signs ED Triage Vitals [04/22/23 0823]  Encounter Vitals Group     BP (!) 203/110     Systolic BP Percentile      Diastolic BP Percentile      Pulse Rate 73     Resp 16     Temp 98.2 F (36.8 C)     Temp Source Oral     SpO2 98 %     Weight 108 lb (49 kg)     Height 5\' 1"  (1.549 m)     Head Circumference      Peak Flow      Pain Score 0     Pain Loc      Pain Education      Exclude from Growth Chart    No data found.  Updated Vital Signs BP (!) 202/115 (BP Location: Left Arm)   Pulse 73   Temp 98.2 F (36.8 C) (Oral)   Resp 16   Ht 5\' 1"  (1.549 m)   Wt 108 lb (49 kg)   SpO2 98%   BMI 20.41 kg/m   Physical Exam Vitals and nursing note reviewed.  Constitutional:      General: She is not in acute distress.    Appearance: She is not ill-appearing.  HENT:      Mouth/Throat:     Mouth: Mucous membranes are moist.     Pharynx: Oropharynx is clear.  Eyes:     Extraocular Movements: Extraocular movements intact.     Conjunctiva/sclera: Conjunctivae normal.     Pupils: Pupils are equal, round, and reactive to light.  Cardiovascular:     Rate and Rhythm: Normal rate and regular rhythm.     Pulses: Normal pulses.     Heart sounds: Normal heart sounds.     Comments: RRR, strong radial pulses Pulmonary:     Effort: Pulmonary effort is normal.     Breath sounds: Normal breath sounds.     Comments: Clear lungs, normal work of breathing, no acute distress Abdominal:     Palpations: Abdomen is soft.     Tenderness: There is no abdominal tenderness.  Musculoskeletal:        General: Normal range of motion.     Cervical back: Normal range of motion.     Right lower leg: No edema.     Left lower leg: No edema.  Skin:    General: Skin is warm and dry.  Neurological:     General: No focal deficit present.     Mental Status: She is alert  and oriented to person, place, and time.     Cranial Nerves: No cranial nerve deficit.     Sensory: No sensory deficit.     Motor: No weakness or tremor.     Coordination: Coordination normal.     Gait: Gait normal.      UC Treatments / Results  Labs (all labs ordered are listed, but only abnormal results are displayed) Labs Reviewed - No data to display  EKG  Radiology No results found.  Procedures Procedures (including critical care time)  Medications Ordered in UC Medications - No data to display  Initial Impression / Assessment and Plan / UC Course  I have reviewed the triage vital signs and the nursing notes.  Pertinent labs & imaging results that were available during my care of the patient were reviewed by me and considered in my medical decision making (see chart for details).  Hypertensive 202/115 Discussed with patient the dangers of uncontrolled BP especially with her history. She does  not have red flag symptoms at this time. Neurologically intact. Strict precautions are given for any new symptoms or acute change. She will take her amlodipine and Hyzaar daily as prescribed.  Cough Likely viral. Defer testing at this time, shared decision making. Advised symptomatic care. Can try promethazine DM, understands drowsy precautions. Tylenol for body aches. Return precautions Work note provided Patient agreeable to plan, no questions at this time  Final Clinical Impressions(s) / UC Diagnoses   Final diagnoses:  Viral URI with cough  Hypertension, unspecified type     Discharge Instructions      Please take your amlodipine and Hyzaar when you get home. Continue taking daily If you develop headache, dizziness, vision changes, unilateral weakness, chest or abdominal pain, shortness of breath, please go directly to the emergency department  Tylenol for body aches The promethazine DM cough syrup can be used up to 4 times daily. If this medication makes you drowsy, take only once before bed.  Please return if needed    ED Prescriptions     Medication Sig Dispense Auth. Provider   promethazine-dextromethorphan (PROMETHAZINE-DM) 6.25-15 MG/5ML syrup Take 2.5 mLs by mouth 4 (four) times daily as needed for cough. 180 mL Anysha Frappier, Lurena Joiner, PA-C      PDMP not reviewed this encounter.   Marlow Baars, [place] 04/22/23 [phone]

## 2023-05-02 ENCOUNTER — Ambulatory Visit (INDEPENDENT_AMBULATORY_CARE_PROVIDER_SITE_OTHER): Payer: Medicare Other | Admitting: Primary Care

## 2023-05-02 ENCOUNTER — Encounter (INDEPENDENT_AMBULATORY_CARE_PROVIDER_SITE_OTHER): Payer: Self-pay | Admitting: Primary Care

## 2023-05-02 ENCOUNTER — Encounter (INDEPENDENT_AMBULATORY_CARE_PROVIDER_SITE_OTHER): Payer: Self-pay

## 2023-05-02 ENCOUNTER — Telehealth: Payer: Self-pay

## 2023-05-02 ENCOUNTER — Ambulatory Visit (INDEPENDENT_AMBULATORY_CARE_PROVIDER_SITE_OTHER): Payer: Medicare Other

## 2023-05-02 ENCOUNTER — Other Ambulatory Visit: Payer: Self-pay

## 2023-05-02 ENCOUNTER — Telehealth (INDEPENDENT_AMBULATORY_CARE_PROVIDER_SITE_OTHER): Payer: Self-pay | Admitting: Licensed Clinical Social Worker

## 2023-05-02 VITALS — Ht 61.0 in | Wt 106.0 lb

## 2023-05-02 VITALS — BP 198/119 | HR 86 | Resp 16 | Wt 105.8 lb

## 2023-05-02 DIAGNOSIS — E782 Mixed hyperlipidemia: Secondary | ICD-10-CM | POA: Diagnosis not present

## 2023-05-02 DIAGNOSIS — Z0000 Encounter for general adult medical examination without abnormal findings: Secondary | ICD-10-CM | POA: Diagnosis not present

## 2023-05-02 DIAGNOSIS — Z760 Encounter for issue of repeat prescription: Secondary | ICD-10-CM

## 2023-05-02 DIAGNOSIS — Z5989 Other problems related to housing and economic circumstances: Secondary | ICD-10-CM

## 2023-05-02 DIAGNOSIS — F32A Depression, unspecified: Secondary | ICD-10-CM

## 2023-05-02 DIAGNOSIS — I10 Essential (primary) hypertension: Secondary | ICD-10-CM | POA: Diagnosis not present

## 2023-05-02 DIAGNOSIS — Z5986 Financial insecurity: Secondary | ICD-10-CM

## 2023-05-02 DIAGNOSIS — Z1231 Encounter for screening mammogram for malignant neoplasm of breast: Secondary | ICD-10-CM

## 2023-05-02 DIAGNOSIS — Z23 Encounter for immunization: Secondary | ICD-10-CM

## 2023-05-02 DIAGNOSIS — E119 Type 2 diabetes mellitus without complications: Secondary | ICD-10-CM | POA: Diagnosis not present

## 2023-05-02 DIAGNOSIS — Z7984 Long term (current) use of oral hypoglycemic drugs: Secondary | ICD-10-CM

## 2023-05-02 LAB — POCT GLYCOSYLATED HEMOGLOBIN (HGB A1C): HbA1c, POC (controlled diabetic range): 6.4 % (ref 0.0–7.0)

## 2023-05-02 MED ORDER — ZOSTER VAC RECOMB ADJUVANTED 50 MCG/0.5ML IM SUSR: 0.5000 mL | Freq: Once | INTRAMUSCULAR | 1 refills | Status: AC

## 2023-05-02 MED ORDER — AMLODIPINE BESYLATE 10 MG PO TABS
10.0000 mg | ORAL_TABLET | Freq: Every day | ORAL | 1 refills | Status: DC
Start: 2023-05-02 — End: 2023-07-10

## 2023-05-02 MED ORDER — ROSUVASTATIN CALCIUM 40 MG PO TABS: 40.0000 mg | ORAL_TABLET | Freq: Every day | ORAL | 1 refills | Status: DC

## 2023-05-02 MED ORDER — LOSARTAN POTASSIUM-HCTZ 100-25 MG PO TABS
1.0000 | ORAL_TABLET | Freq: Every day | ORAL | 1 refills | Status: DC
Start: 2023-05-02 — End: 2023-11-01

## 2023-05-02 MED ORDER — DULOXETINE HCL 30 MG PO CPEP
30.0000 mg | ORAL_CAPSULE | Freq: Every day | ORAL | 1 refills | Status: DC
Start: 2023-05-02 — End: 2024-05-04

## 2023-05-02 NOTE — Telephone Encounter (Signed)
Met with pt during a warm hand off per PCP, pt recently lost her mother and would like to speak with someone about her depression. Pt also has a 47yr.old disabled daughter she cares for.

## 2023-05-02 NOTE — Patient Instructions (Signed)
[name] , Thank you for taking time to come for your Medicare Wellness Visit. I appreciate your ongoing commitment to your health goals. Please review the following plan we discussed and let me know if I can assist you in the future.   Referrals/Orders/Follow-Ups/Clinician Recommendations:  Next Medicare Annual Wellness Visit: May 04, 2024 at 11:20 am virtual visit.  You have an order for:  []   2D Mammogram  [x]   3D Mammogram  []   Bone Density     Please call for appointment:   [facility] [address] [place], [place] [address] [phone]  Make sure to wear two-piece clothing.  No lotions powders or deodorants the day of the appointment Make sure to bring picture ID and insurance card.  Bring list of medications you are currently taking including any supplements.   Schedule your [place] screening mammogram through MyChart!   Log into your MyChart account.  Go to 'Visit' (or 'Appointments' if on mobile App) --> Schedule an Appointment  Under 'Select a Reason for Visit' choose the Mammogram Screening option.  Complete the pre-visit questions and select the time and place that best fits your schedule.    This is a list of the screening recommended for you and due dates:  Health Maintenance  Topic Date Due   Zoster (Shingles) Vaccine (1 of 2) Never done   Eye exam for diabetics  07/25/2018   Complete foot exam   11/15/2022   Mammogram  01/03/2023   COVID-19 Vaccine (3 - 2023-24 season) 02/24/2023   Yearly kidney health urinalysis for diabetes  04/07/2023   Hemoglobin A1C  10/30/2023   Yearly kidney function blood test for diabetes  12/06/2023   Medicare Annual Wellness Visit  05/01/2024   DEXA scan (bone density measurement)  11/12/2024   DTaP/Tdap/Td vaccine (2 - Td or Tdap) 04/26/2027   Colon Cancer Screening  02/13/2028   Pneumonia Vaccine  Completed   Flu Shot  Completed   Hepatitis C Screening  Completed   HPV Vaccine   Aged Out    Advanced directives: (Declined) Advance directive discussed with you today. Even though you declined this today, please call our office should you change your mind, and we can give you the proper paperwork for you to fill out.  Next Medicare Annual Wellness Visit scheduled for next year: Yes  Preventive Care 68 Years and Older, Female Preventive care refers to lifestyle choices and visits with your health care provider that can promote health and wellness. Preventive care visits are also called wellness exams. What can I expect for my preventive care visit? Counseling Your health care provider may ask you questions about your: Medical history, including: Past medical problems. Family medical history. Pregnancy and menstrual history. History of falls. Current health, including: Memory and ability to understand (cognition). Emotional well-being. Home life and relationship well-being. Sexual activity and sexual health. Lifestyle, including: Alcohol, nicotine or tobacco, and drug use. Access to firearms. Diet, exercise, and sleep habits. Work and work Astronomer. Sunscreen use. Safety issues such as seatbelt and bike helmet use. Physical exam Your health care provider will check your: Height and weight. These may be used to calculate your BMI (body mass index). BMI is a measurement that tells if you are at a healthy weight. Waist circumference. This measures the distance around your waistline. This measurement also tells if you are at a healthy weight and may help predict your risk of certain diseases, such as type 2 diabetes and  high blood pressure. Heart rate and blood pressure. Body temperature. Skin for abnormal spots. What immunizations do I need?  Vaccines are usually given at various ages, according to a schedule. Your health care provider will recommend vaccines for you based on your age, medical history, and lifestyle or other factors, such as travel or where  you work. What tests do I need? Screening Your health care provider may recommend screening tests for certain conditions. This may include: Lipid and cholesterol levels. Hepatitis C test. Hepatitis B test. HIV (human immunodeficiency virus) test. STI (sexually transmitted infection) testing, if you are at risk. Lung cancer screening. Colorectal cancer screening. Diabetes screening. This is done by checking your blood sugar (glucose) after you have not eaten for a while (fasting). Mammogram. Talk with your health care provider about how often you should have regular mammograms. BRCA-related cancer screening. This may be done if you have a family history of breast, ovarian, tubal, or peritoneal cancers. Bone density scan. This is done to screen for osteoporosis. Talk with your health care provider about your test results, treatment options, and if necessary, the need for more tests. Follow these instructions at home: Eating and drinking  Eat a diet that includes fresh fruits and vegetables, whole grains, lean protein, and low-fat dairy products. Limit your intake of foods with high amounts of sugar, saturated fats, and salt. Take vitamin and mineral supplements as recommended by your health care provider. Do not drink alcohol if your health care provider tells you not to drink. If you drink alcohol: Limit how much you have to 0-1 drink a day. Know how much alcohol is in your drink. In the [place] one drink equals one 12 oz bottle of beer (355 mL), one 5 oz glass of wine (148 mL), or one 1 oz glass of hard liquor (44 mL). Lifestyle Brush your teeth every morning and night with fluoride toothpaste. Floss one time each day. Exercise for at least 30 minutes 5 or more days each week. Do not use any products that contain nicotine or tobacco. These products include cigarettes, chewing tobacco, and vaping devices, such as e-cigarettes. If you need help quitting, ask your health care provider. Do  not use drugs. If you are sexually active, practice safe sex. Use a condom or other form of protection in order to prevent STIs. Take aspirin only as told by your health care provider. Make sure that you understand how much to take and what form to take. Work with your health care provider to find out whether it is safe and beneficial for you to take aspirin daily. Ask your health care provider if you need to take a cholesterol-lowering medicine (statin). Find healthy ways to manage stress, such as: Meditation, yoga, or listening to music. Journaling. Talking to a trusted person. Spending time with friends and family. Minimize exposure to UV radiation to reduce your risk of skin cancer. Safety Always wear your seat belt while driving or riding in a vehicle. Do not drive: If you have been drinking alcohol. Do not ride with someone who has been drinking. When you are tired or distracted. While texting. If you have been using any mind-altering substances or drugs. Wear a helmet and other protective equipment during sports activities. If you have firearms in your house, make sure you follow all gun safety procedures. What's next? Visit your health care provider once a year for an annual wellness visit. Ask your health care provider how often you should have your eyes and  teeth checked. Stay up to date on all vaccines. This information is not intended to replace advice given to you by your health care provider. Make sure you discuss any questions you have with your health care provider. Document Revised: 12/07/2020 Document Reviewed: 12/07/2020 Elsevier Patient Education  2024 [employer]. Understanding Your Risk for Falls Millions of people have serious injuries from falls each year. It is important to understand your risk of falling. Talk with your health care provider about your risk and what you can do to lower it. If you do have a serious fall, make sure to tell your provider. Falling  once raises your risk of falling again. How can falls affect me? Serious injuries from falls are common. These include: Broken bones, such as hip fractures. Head injuries, such as traumatic brain injuries (TBI) or concussions. A fear of falling can cause you to avoid activities and stay at home. This can make your muscles weaker and raise your risk for a fall. What can increase my risk? There are a number of risk factors that increase your risk for falling. The more risk factors you have, the higher your risk of falling. Serious injuries from a fall happen most often to people who are older than 68 years old. Teenagers and young adults ages 28-29 are also at higher risk. Common risk factors include: Weakness in the lower body. Being generally weak or confused due to long-term (chronic) illness. Dizziness or balance problems. Poor vision. Medicines that cause dizziness or drowsiness. These may include: Medicines for your blood pressure, heart, anxiety, insomnia, or swelling (edema). Pain medicines. Muscle relaxants. Other risk factors include: Drinking alcohol. Having had a fall in the past. Having foot pain or wearing improper footwear. Working at a dangerous job. Having any of the following in your home: Tripping hazards, such as floor clutter or loose rugs. Poor lighting. Pets. Having dementia or memory loss. What actions can I take to lower my risk of falling?     Physical activity Stay physically fit. Do strength and balance exercises. Consider taking a regular class to build strength and balance. Yoga and tai chi are good options. Vision Have your eyes checked every year and your prescription for glasses or contacts updated as needed. Shoes and walking aids Wear non-skid shoes. Wear shoes that have rubber soles and low heels. Do not wear high heels. Do not walk around the house in socks or slippers. Use a cane or walker as told by your provider. Home safety Attach  secure railings on both sides of your stairs. Install grab bars for your bathtub, shower, and toilet. Use a non-skid mat in your bathtub or shower. Attach bath mats securely with double-sided, non-slip rug tape. Use good lighting in all rooms. Keep a flashlight near your bed. Make sure there is a clear path from your bed to the bathroom. Use night-lights. Do not use throw rugs. Make sure all carpeting is taped or tacked down securely. Remove all clutter from walkways and stairways, including extension cords. Repair uneven or broken steps and floors. Avoid walking on icy or slippery surfaces. Walk on the grass instead of on icy or slick sidewalks. Use ice melter to get rid of ice on walkways in the winter. Use a cordless phone. Questions to ask your health care provider Can you help me check my risk for a fall? Do any of my medicines make me more likely to fall? Should I take a vitamin D supplement? What exercises can I do  to improve my strength and balance? Should I make an appointment to have my vision checked? Do I need a bone density test to check for weak bones (osteoporosis)? Would it help to use a cane or a walker? Where to find more information Centers for Disease Control and Prevention, STEADI: TonerPromos.no Community-Based Fall Prevention Programs: TonerPromos.no [employer] on Aging: BaseRingTones.pl Contact a health care provider if: You fall at home. You are afraid of falling at home. You feel weak, drowsy, or dizzy. This information is not intended to replace advice given to you by your health care provider. Make sure you discuss any questions you have with your health care provider. Document Revised: 02/12/2022 Document Reviewed: 02/12/2022 Elsevier Patient Education  2024 Elsevier Inc. Managing Pain Without Opioids Opioids are strong medicines used to treat moderate to severe pain. For some people, especially those who have long-term (chronic) pain, opioids may not be the best  choice for pain management due to: Side effects like nausea, constipation, and sleepiness. The risk of addiction (opioid use disorder). The longer you take opioids, the greater your risk of addiction. Pain that lasts for more than 3 months is called chronic pain. Managing chronic pain usually requires more than one approach and is often provided by a team of health care providers working together (multidisciplinary approach). Pain management may be done at a pain management center or pain clinic. How to manage pain without the use of opioids Use non-opioid medicines Non-opioid medicines for pain may include: Over-the-counter or prescription non-steroidal anti-inflammatory drugs (NSAIDs). These may be the first medicines used for pain. They work well for muscle and bone pain, and they reduce swelling. Acetaminophen. This over-the-counter medicine may work well for milder pain but not swelling. Antidepressants. These may be used to treat chronic pain. A certain type of antidepressant (tricyclics) is often used. These medicines are given in lower doses for pain than when used for depression. Anticonvulsants. These are usually used to treat seizures but may also reduce nerve (neuropathic) pain. Muscle relaxants. These relieve pain caused by sudden muscle tightening (spasms). You may also use a pain medicine that is applied to the skin as a patch, cream, or gel (topical analgesic), such as a numbing medicine. These may cause fewer side effects than medicines taken by mouth. Do certain therapies as directed Some therapies can help with pain management. They include: Physical therapy. You will do exercises to gain strength and flexibility. A physical therapist may teach you exercises to move and stretch parts of your body that are weak, stiff, or painful. You can learn these exercises at physical therapy visits and practice them at home. Physical therapy may also involve: Massage. Heat wraps or applying  heat or cold to affected areas. Electrical signals that interrupt pain signals (transcutaneous electrical nerve stimulation, TENS). Weak lasers that reduce pain and swelling (low-level laser therapy). Signals from your body that help you learn to regulate pain (biofeedback). Occupational therapy. This helps you to learn ways to function at home and work with less pain. Recreational therapy. This involves trying new activities or hobbies, such as a physical activity or drawing. Mental health therapy, including: Cognitive behavioral therapy (CBT). This helps you learn coping skills for dealing with pain. Acceptance and commitment therapy (ACT) to change the way you think and react to pain. Relaxation therapies, including muscle relaxation exercises and mindfulness-based stress reduction. Pain management counseling. This may be individual, family, or group counseling.  Receive medical treatments Medical treatments for pain management include:  Nerve block injections. These may include a pain blocker and anti-inflammatory medicines. You may have injections: Near the spine to relieve chronic back or neck pain. Into joints to relieve back or joint pain. Into nerve areas that supply a painful area to relieve body pain. Into muscles (trigger point injections) to relieve some painful muscle conditions. A medical device placed near your spine to help block pain signals and relieve nerve pain or chronic back pain (spinal cord stimulation device). Acupuncture. Follow these instructions at home Medicines Take over-the-counter and prescription medicines only as told by your health care provider. If you are taking pain medicine, ask your health care providers about possible side effects to watch out for. Do not drive or use heavy machinery while taking prescription opioid pain medicine. Lifestyle  Do not use drugs or alcohol to reduce pain. If you drink alcohol, limit how much you have to: 0-1 drink a  day for women who are not pregnant. 0-2 drinks a day for men. Know how much alcohol is in a drink. In the [place] one drink equals one 12 oz bottle of beer (355 mL), one 5 oz glass of wine (148 mL), or one 1 oz glass of hard liquor (44 mL). Do not use any products that contain nicotine or tobacco. These products include cigarettes, chewing tobacco, and vaping devices, such as e-cigarettes. If you need help quitting, ask your health care provider. Eat a healthy diet and maintain a healthy weight. Poor diet and excess weight may make pain worse. Eat foods that are high in fiber. These include fresh fruits and vegetables, whole grains, and beans. Limit foods that are high in fat and processed sugars, such as fried and sweet foods. Exercise regularly. Exercise lowers stress and may help relieve pain. Ask your health care provider what activities and exercises are safe for you. If your health care provider approves, join an exercise class that combines movement and stress reduction. Examples include yoga and tai chi. Get enough sleep. Lack of sleep may make pain worse. Lower stress as much as possible. Practice stress reduction techniques as told by your therapist. General instructions Work with all your pain management providers to find the treatments that work best for you. You are an important member of your pain management team. There are many things you can do to reduce pain on your own. Consider joining an online or in-person support group for people who have chronic pain. Keep all follow-up visits. This is important. Where to find more information You can find more information about managing pain without opioids from: American Academy of Pain Medicine: painmed.org Institute for Chronic Pain: instituteforchronicpain.org American Chronic Pain Association: theacpa.org Contact a health care provider if: You have side effects from pain medicine. Your pain gets worse or does not get better with  treatments or home therapy. You are struggling with anxiety or depression. Summary Many types of pain can be managed without opioids. Chronic pain may respond better to pain management without opioids. Pain is best managed when you and a team of health care providers work together. Pain management without opioids may include non-opioid medicines, medical treatments, physical therapy, mental health therapy, and lifestyle changes. Tell your health care providers if your pain gets worse or is not being managed well enough. This information is not intended to replace advice given to you by your health care provider. Make sure you discuss any questions you have with your health care provider. Document Revised: 09/21/2020 Document Reviewed: 09/21/2020 Elsevier Patient  Education  2024 [employer].

## 2023-05-02 NOTE — Patient Instructions (Signed)

## 2023-05-02 NOTE — Telephone Encounter (Addendum)
I spoke to the patient while she was at her appointment with Gwinda Passe, NP at [facility].   The patient explained how she is struggling to pay her rent and now owes $3000.  She stated that her landlord issued her a letter stating she has until 11/15 to make her payment or she will need to move out and they will give her ample time to make the the move.  She said she has not been able to obtain any assistance from DSS. She is planning to contact [employer] but she will have trouble finding a place she can afford.  While dealing with her housing issues, her mother recently passed away, so she is also trying to cope with that loss. I explained to her that I can place a referral to [facility] [place] for possible assistance with working with her landlord to prevent an eviction. The patient was in agreement to placing that referral. I told her that there is no guarantee that they can help but we can try. She said she understood and I confirmed her phone number and address   Referral then placed to [facility] [place].

## 2023-05-02 NOTE — Progress Notes (Signed)
Because this visit was a virtual/telehealth visit,  certain criteria was not obtained, such a blood pressure, CBG if applicable, and timed get up and go. Any medications not marked as "taking" were not mentioned during the medication reconciliation part of the visit. Any vitals not documented were not able to be obtained due to this being a telehealth visit or patient was unable to self-report a recent blood pressure reading due to a lack of equipment at home via telehealth. Vitals that have been documented are verbally provided by the patient.   Subjective:   [name] is a 67 y.o. female who presents for Medicare Annual (Subsequent) preventive examination.  Visit Complete: In person  Patient Medicare AWV questionnaire was completed by the patient on na; I have confirmed that all information answered by patient is correct and no changes since this date.  Cardiac Risk Factors include: advanced age (>56men, >37 women);diabetes mellitus;dyslipidemia;hypertension;sedentary lifestyle     Objective:    Today's Vitals   05/02/23 1116 05/02/23 1118  Weight: 106 lb (48.1 kg)   Height: 5\' 1"  (1.549 m)   PainSc:  0-No pain   Body mass index is 20.03 kg/m.     05/02/2023   11:13 AM 03/05/2023   10:20 AM 12/06/2022    6:34 PM 12/03/2022    8:00 AM 06/16/2022    8:07 PM 02/02/2022   12:23 PM 01/22/2022    9:56 AM  Advanced Directives  Does Patient Have a Medical Advance Directive? No No No No No No No  Would patient like information on creating a medical advance directive? No - Patient declined No - Patient declined   No - Patient declined Yes (MAU/Ambulatory/Procedural Areas - Information given) No - Patient declined    Current Medications (verified) Outpatient Encounter Medications as of 05/02/2023  Medication Sig   amLODipine (NORVASC) 10 MG tablet Take 1 tablet (10 mg total) by mouth daily.   dapagliflozin propanediol (FARXIGA) 5 MG TABS tablet Take 1 tablet (5 mg total) by mouth  daily.   dicyclomine (BENTYL) 10 MG capsule Take 1 capsule (10 mg total) by mouth 3 (three) times daily before meals.   DULoxetine (CYMBALTA) 30 MG capsule Take 1 capsule (30 mg total) by mouth daily.   loperamide (IMODIUM) 2 MG capsule Take 1 capsule (2 mg total) by mouth 4 (four) times daily as needed for diarrhea or loose stools.   losartan-hydrochlorothiazide (HYZAAR) 100-25 MG tablet Take 1 tablet by mouth daily.   methocarbamol (ROBAXIN) 500 MG tablet Take 1 tablet (500 mg total) by mouth every 8 (eight) hours as needed for muscle spasms.   metoprolol succinate (TOPROL-XL) 25 MG 24 hr tablet TAKE ONE TABLET (25MG  TOTAL) BY MOUTH DAILY AT 9AM   ondansetron (ZOFRAN) 4 MG tablet Take 1 tablet (4 mg total) by mouth every 8 (eight) hours as needed for nausea or vomiting.   ondansetron (ZOFRAN) 8 MG tablet Take one prior to each prep dose.   oxyCODONE-acetaminophen (PERCOCET/ROXICET) 5-325 MG tablet Take 1 tablet by mouth every 6 (six) hours as needed for moderate pain or severe pain.   pantoprazole (PROTONIX) 40 MG tablet TAKE ONE TABLET (40mg  total) BY MOUTH DAILY AT 9AM (Patient taking differently: Take 40 mg by mouth daily as needed (for heartburn).)   potassium chloride SA (KLOR-CON M) 20 MEQ tablet Take 2 tablets (40 mEq total) by mouth daily for 3 days.   pregabalin (LYRICA) 50 MG capsule Take 50 mg by mouth 3 (three) times daily.  promethazine-dextromethorphan (PROMETHAZINE-DM) 6.25-15 MG/5ML syrup Take 2.5 mLs by mouth 4 (four) times daily as needed for cough.   rosuvastatin (CRESTOR) 40 MG tablet Take 1 tablet (40 mg total) by mouth daily.   Zoster Vaccine Adjuvanted [facility]) injection Inject 0.5 mLs into the muscle once for 1 dose.   No facility-administered encounter medications on file as of 05/02/2023.    Allergies (verified) Patient has no known allergies.   History: Past Medical History:  Diagnosis Date   Anemia    hx   Aneurysm (HCC)    small, on left side of brain     Anxiety    hx of   Arthritis    generalized   Diabetes mellitus without complication (HCC)    diet control, pt denies- on medications   GERD (gastroesophageal reflux disease)    iwht certain foods/on meds/OTC PRN meds   Hyperlipidemia    on meds   Hypertension    on meds   TIA (transient ischemic attack)    pt unaware of this hx on 06/19/2017   Past Surgical History:  Procedure Laterality Date   ANTERIOR CERVICAL DECOMP/DISCECTOMY FUSION     BACK SURGERY     CARPAL TUNNEL RELEASE Left 2024   CESAREAN SECTION  1986   FOOT SURGERY Left 11/2019   IR GENERIC HISTORICAL  09/21/2014   IR ANGIO INTRA EXTRACRAN SEL INTERNAL CAROTID UNI L MOD SED 09/21/2014 Lisbeth Renshaw, MD [facility]   IR GENERIC HISTORICAL  09/21/2014   IR ANGIO INTRA EXTRACRAN SEL COM CAROTID INNOMINATE UNI R MOD SED 09/21/2014 Lisbeth Renshaw, MD [facility]   IR GENERIC HISTORICAL  09/21/2014   IR ANGIO VERTEBRAL SEL VERTEBRAL UNI L MOD SED 09/21/2014 Lisbeth Renshaw, MD [facility]   IR GENERIC HISTORICAL  09/21/2014   IR 3D INDEPENDENT WKST 09/21/2014 Lisbeth Renshaw, MD [facility]   LAPAROSCOPIC CHOLECYSTECTOMY     TUBAL LIGATION     TUBAL LIGATION     Family History  Problem Relation Age of Onset   Hypertension Mother    Arthritis Mother    Diabetes Mother    Dementia Mother    Heart attack Father    Heart disease Father    Hypertension Sister    Diabetes Sister    Hypertension Sister    Heart disease Sister    Diabetes Sister    Hypertension Brother    Heart disease Brother    Diabetes Brother    Hypertension Brother    Hypertension Brother    Hypertension Brother    Cancer Neg Hx    Colon cancer Neg Hx    Colon polyps Neg Hx    Esophageal cancer Neg Hx    Rectal cancer Neg Hx    Stomach cancer Neg Hx    Breast cancer Neg Hx    Social History   Socioeconomic History   Marital status: Widowed    Spouse name: Not on file   Number of children: 1   Years of education:  Not on file   Highest education level: Some college, no degree  Occupational History   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      05-19-23 mother passed away a month ago   Social Determinants of Health   Financial Resource Strain: Low Risk  (May 19, 2023)   Overall Financial Resource Strain (CARDIA)    Difficulty of Paying Living Expenses: Not hard at all  Food Insecurity: No Food Insecurity (05-19-23)   Hunger Vital Sign    Worried About Running Out of Food in the Last Year: Never true    Ran Out of Food in the Last Year: Never true  Transportation Needs: No Transportation Needs (2023/05/19)   PRAPARE - Administrator, Civil Service (Medical): No    Lack of Transportation (Non-Medical): No  Physical Activity: Inactive (05-19-2023)   Exercise Vital Sign    Days of Exercise per Week: 0 days    Minutes of Exercise per Session: 0 min  Stress: No Stress Concern Present (19-May-2023)   [employer] of Occupational Health - Occupational Stress Questionnaire    Feeling of Stress : Only a little  Social Connections: Moderately Isolated (05-19-2023)   Social Connection and Isolation Panel [NHANES]    Frequency of Communication with Friends and Family: More than three times a week    Frequency of Social Gatherings with Friends and Family: Once a week    Attends Religious Services: 1 to 4 times per year    Active Member of [employer] or Organizations: No    Attends Tax inspector Meetings: Never    Marital Status: Widowed    Tobacco Counseling Counseling given: Yes   Clinical Intake:  Pre-visit preparation completed: Yes  Pain : No/denies pain Pain Score: 0-No pain     BMI - recorded: 20.03 Nutritional Risks: None Diabetes: Yes CBG done?: No (telehealth visit. unable to obtain cbg) Did pt. bring in CBG monitor from home?: No  How often do you need to have someone help you when you read instructions, pamphlets, or other written materials from your doctor or pharmacy?: 1 - Never  Interpreter Needed?: No  Information entered by :: Abby Dore Oquin, CMA   Activities of Daily Living    05/19/23   11:31 AM 12/03/2022    7:00 AM  In your present state of health, do you have any difficulty performing the following activities:  Hearing? 0 0  Vision? 0 0  Difficulty concentrating or making decisions? 0 0  Walking or climbing stairs? 0 0  Dressing or bathing? 0 0  Doing errands, shopping? 0   Preparing Food and eating ? N   Using the Toilet? N   In the past six months, have you accidently leaked urine? N   Do you have problems with loss of bowel control? N   Managing your Medications? N   Managing your Finances? N   Housekeeping or managing your Housekeeping? N     Patient Care Team: [name], NP as PCP - General (Internal Medicine) Myrtie Neither Andreas Blower, MD as Consulting Physician (Gastroenterology)  Indicate any recent Medical Services you may have received from other than [facility] providers in the past year (date may be approximate).     Assessment:   This is a routine wellness examination for [place].  Hearing/Vision screen Hearing Screening - Comments:: Patient denies any hearing  difficulties.   Vision Screening - Comments:: Wears rx glasses - up to date with routine eye exams with Dr. Cathey Endow at [facility]    Goals Addressed             This Visit's Progress    Patient Stated       To work on  bettering my health       Depression Screen    05/02/2023   11:23 AM 05/02/2023    8:53 AM 12/10/2022    3:11 PM 07/26/2022    2:27 PM 06/04/2022   11:05 AM 04/06/2022    9:00 AM 02/16/2022    8:34 AM  PHQ 2/9 Scores  PHQ - 2 Score 1 0 0 0 0 0 0  PHQ- 9 Score 2 1         Fall Risk    05/02/2023   11:31 AM 05/02/2023    8:53 AM 12/10/2022    3:10 PM 07/26/2022    2:27 PM 06/04/2022   11:05 AM  Fall Risk   Falls in the past year? 0 0 0 0 0  Number falls in past yr: 0 0 0 0 0  Injury with Fall? 0 0 0 0 0  Risk for fall due to : No Fall Risks No Fall Risks No Fall Risks No Fall Risks No Fall Risks  Follow up Falls prevention discussed        MEDICARE RISK AT HOME: Medicare Risk at Home Any stairs in or around the home?: Yes If so, are there any without handrails?: No Home free of loose throw rugs in walkways, pet beds, electrical cords, etc?: Yes Adequate lighting in your home to reduce risk of falls?: Yes Life alert?: No Use of a cane, walker or w/c?: No Grab bars in the bathroom?: Yes Shower chair or bench in shower?: Yes Elevated toilet seat or a handicapped toilet?: Yes  TIMED UP AND GO:  Was the test performed?  No    Cognitive Function:        05/02/2023   11:20 AM 12/15/2021   11:49 AM  6CIT Screen  What Year? 0 points 0 points  What month? 0 points 0 points  What time? 0 points 0 points  Count back from 20 0 points 0 points  Months in reverse 0 points 0 points  Repeat phrase 0 points 2 points  Total Score 0 points 2 points    Immunizations Immunization History  Administered Date(s) Administered   Influenza,inj,Quad PF,6+ Mos 04/25/2017, 05/02/2020, 05/15/2021, 02/16/2022   PFIZER(Purple Top)SARS-COV-2 Vaccination 01/15/2020, 02/05/2020   PNEUMOCOCCAL CONJUGATE-20 02/16/2022   PPD Test 04/11/2015, 06/29/2016   Pneumococcal Conjugate-13 01/29/2020   Tdap 04/25/2017    TDAP status: Up to date  Flu Vaccine status: Up to date  Pneumococcal vaccine  status: Up to date  Covid-19 vaccine status: Information provided on how to obtain vaccines.   Qualifies for Shingles Vaccine? Yes   Zostavax completed No   Shingrix Completed?: No.    Education has been provided regarding the importance of this vaccine. Patient has been advised to call insurance company to determine out of pocket expense if they have not yet received this vaccine. Advised may also receive vaccine at local pharmacy or Health Dept. Verbalized acceptance and understanding.  Screening Tests Health Maintenance  Topic Date Due   Zoster Vaccines- Shingrix (1 of 2) Never done   OPHTHALMOLOGY EXAM  07/25/2018   FOOT EXAM  11/15/2022   Medicare Annual Wellness (AWV)  12/16/2022   INFLUENZA VACCINE  01/24/2023   COVID-19 Vaccine (3 - 2023-24 season) 02/24/2023   Diabetic kidney evaluation - Urine ACR  04/07/2023   HEMOGLOBIN A1C  10/30/2023   Diabetic kidney evaluation - eGFR measurement  12/06/2023   MAMMOGRAM  01/03/2024   DTaP/Tdap/Td (2 - Td or Tdap) 04/26/2027   Colonoscopy  02/13/2028   Pneumonia Vaccine 26+ Years old  Completed   DEXA SCAN  Completed   Hepatitis C Screening  Completed   HPV VACCINES  Aged Out    Health Maintenance  Health Maintenance Due  Topic Date Due   Zoster Vaccines- Shingrix (1 of 2) Never done   OPHTHALMOLOGY EXAM  07/25/2018   FOOT EXAM  11/15/2022   Medicare Annual Wellness (AWV)  12/16/2022   INFLUENZA VACCINE  01/24/2023   COVID-19 Vaccine (3 - 2023-24 season) 02/24/2023   Diabetic kidney evaluation - Urine ACR  04/07/2023    Colorectal cancer screening: Type of screening: Colonoscopy. Completed 02/13/2023. Repeat every 5 years  Mammogram status: Ordered 05/02/2023. Pt provided with contact info and advised to call to schedule appt.   Bone Density status: Completed 11/13/2019. Results reflect: Bone density results: NORMAL. Repeat every 5 years.  Lung Cancer Screening: (Low Dose CT Chest recommended if Age 53-80 years, 20  pack-year currently smoking OR have quit w/in 15years.) does not qualify.   Lung Cancer Screening Referral: na  Additional Screening:  Hepatitis C Screening: does not qualify; Completed 05/23/2017  Vision Screening: Recommended annual ophthalmology exams for early detection of glaucoma and other disorders of the eye. Is the patient up to date with their annual eye exam?  No  Who is the provider or what is the name of the office in which the patient attends annual eye exams? Dr. Rodman Pickle. Patient is scheduled for yearly eye exam on 05/24/2023 If pt is not established with a provider, would they like to be referred to a provider to establish care? No .   Dental Screening: Recommended annual dental exams for proper oral hygiene  Diabetic Foot Exam: Patient is past due for a diabetic foot exam. She sees Dr. Ralene Cork at [facility] in [place]. She will call to make an appointment  Community Resource Referral / Chronic Care Management: CRR required this visit?  No   CCM required this visit?  No     Plan:     I have personally reviewed and noted the following in the patient's chart:   Medical and social history Use of alcohol, tobacco or illicit drugs  Current medications and supplements including opioid prescriptions. Patient is currently taking opioid prescriptions. Information provided to patient regarding non-opioid alternatives. Patient advised to discuss non-opioid treatment plan with their provider. Functional ability and status Nutritional status Physical activity Advanced directives List of other physicians Hospitalizations, surgeries, and ER visits in previous 12 months Vitals Screenings to include cognitive, depression, and falls Referrals and appointments  In addition, I have reviewed and discussed with patient certain preventive protocols, quality metrics, and best practice recommendations. A written personalized care plan for preventive services as well as general  preventive health recommendations were provided to patient.     Jordan Hawks Jaxsyn Azam, CMA   05/02/2023   After Visit Summary: (MyChart) Due to this being a telephonic visit, the after visit summary with patients personalized plan was offered to patient via MyChart   Nurse Notes: see routing comment

## 2023-05-03 ENCOUNTER — Ambulatory Visit (INDEPENDENT_AMBULATORY_CARE_PROVIDER_SITE_OTHER): Payer: Medicare Other

## 2023-05-03 DIAGNOSIS — I10 Essential (primary) hypertension: Secondary | ICD-10-CM

## 2023-05-03 DIAGNOSIS — E782 Mixed hyperlipidemia: Secondary | ICD-10-CM

## 2023-05-03 DIAGNOSIS — E119 Type 2 diabetes mellitus without complications: Secondary | ICD-10-CM

## 2023-05-03 LAB — MICROALBUMIN / CREATININE URINE RATIO
Creatinine, Urine: 127.1 mg/dL
Microalb/Creat Ratio: 30 mg/g{creat} — ABNORMAL HIGH (ref 0–29)
Microalbumin, Urine: 37.8 ug/mL

## 2023-05-03 LAB — LIPID PANEL

## 2023-05-03 LAB — CMP14+EGFR

## 2023-05-03 LAB — CBC WITH DIFFERENTIAL/PLATELET

## 2023-05-04 LAB — CBC WITH DIFFERENTIAL/PLATELET
Basophils Absolute: 0.1 10*3/uL (ref 0.0–0.2)
Basos: 1 %
EOS (ABSOLUTE): 0.1 10*3/uL (ref 0.0–0.4)
Eos: 1 %
Hematocrit: 39.4 % (ref 34.0–46.6)
Hemoglobin: 12.5 g/dL (ref 11.1–15.9)
Immature Grans (Abs): 0.1 10*3/uL (ref 0.0–0.1)
Immature Granulocytes: 1 %
Lymphocytes Absolute: 2.2 10*3/uL (ref 0.7–3.1)
Lymphs: 14 %
MCH: 27.7 pg (ref 26.6–33.0)
MCHC: 31.7 g/dL (ref 31.5–35.7)
MCV: 87 fL (ref 79–97)
Monocytes Absolute: 0.6 10*3/uL (ref 0.1–0.9)
Monocytes: 4 %
Neutrophils Absolute: 11.9 10*3/uL — ABNORMAL HIGH (ref 1.4–7.0)
Neutrophils: 79 %
Platelets: 306 10*3/uL (ref 150–450)
RBC: 4.52 x10E6/uL (ref 3.77–5.28)
RDW: 13.5 % (ref 11.7–15.4)
WBC: 15 10*3/uL — ABNORMAL HIGH (ref 3.4–10.8)

## 2023-05-04 LAB — CMP14+EGFR
ALT: 10 [IU]/L (ref 0–32)
AST: 14 [IU]/L (ref 0–40)
Albumin: 4.2 g/dL (ref 3.9–4.9)
Alkaline Phosphatase: 119 [IU]/L (ref 44–121)
BUN/Creatinine Ratio: 12 (ref 12–28)
BUN: 9 mg/dL (ref 8–27)
Bilirubin Total: 0.3 mg/dL (ref 0.0–1.2)
CO2: 20 mmol/L (ref 20–29)
Calcium: 9.9 mg/dL (ref 8.7–10.3)
Chloride: 106 mmol/L (ref 96–106)
Creatinine, Ser: 0.73 mg/dL (ref 0.57–1.00)
Globulin, Total: 3.2 g/dL (ref 1.5–4.5)
Glucose: 113 mg/dL — ABNORMAL HIGH (ref 70–99)
Potassium: 4.7 mmol/L (ref 3.5–5.2)
Sodium: 147 mmol/L — ABNORMAL HIGH (ref 134–144)
Total Protein: 7.4 g/dL (ref 6.0–8.5)
eGFR: 90 mL/min/{1.73_m2} (ref >59)

## 2023-05-04 LAB — LIPID PANEL
Chol/HDL Ratio: 4.2 ratio (ref 0.0–4.4)
Cholesterol, Total: 268 mg/dL — ABNORMAL HIGH (ref 100–199)
HDL: 64 mg/dL (ref >39)
LDL Chol Calc (NIH): 178 mg/dL — ABNORMAL HIGH (ref 0–99)
Triglycerides: 142 mg/dL (ref 0–149)
VLDL Cholesterol Cal: 26 mg/dL (ref 5–40)

## 2023-05-09 ENCOUNTER — Institutional Professional Consult (permissible substitution) (INDEPENDENT_AMBULATORY_CARE_PROVIDER_SITE_OTHER): Payer: Self-pay | Admitting: Licensed Clinical Social Worker

## 2023-05-12 NOTE — Progress Notes (Signed)
Established Patient Office Visit  Subjective   Patient ID: [name]    DOB: Dec 19, 1955  Age: 67 y.o. MRN: [id number]  HPI  REMII Hale is a 67 year old female who recently lost her mother that she has been caring for for several years.  Due to arthritis carpometacarpal [facility]) joint of left thumb she has not been able to work enough hours to pay her rent she is currently 3 months by and also has an adult child that is disable that she also cares for.  With her mother's income not available she is overweight and with eviction notice lites, food and all life necessities.  Contacted clinical nurse manager [name] they spoke at this appointment and she is exploring more avenues that is available to help.  Blood pressure is elevated she cries at this home visit.  HPI     Diabetes    Additional comments: 6.4        Hospitalization Follow-up    Additional comments: ED      Last edited by Herbert Deaner, RMA on 05/02/2023  9:05 AM.      Active Ambulatory Problems    Diagnosis Date Noted   Accelerated hypertension 02/24/2013   Depression 02/24/2013   TIA (transient ischemic attack) 08/19/2014   Bilateral wrist pain 10/06/2014   Cerebral aneurysm without rupture 08/20/2014   Hot flashes, menopausal 10/06/2014   Malaise 10/14/2014   Hypokalemia 10/14/2014   Essential hypertension    Gastroenteritis 11/08/2014   Left ankle sprain 01/20/2015   Breast pain, left 01/05/2016   Need for hepatitis C screening test 05/23/2017   Abdominal pain 06/18/2017   Hyperlipidemia 06/18/2017   Elevated glucose 06/18/2017   Enteritis 06/19/2017   Nausea and vomiting 05/17/2018   Bilateral impacted cerumen 04/02/2019   Conductive hearing loss, bilateral 04/02/2019   Enteritis of infectious origin 10/09/2020   Diabetes mellitus type 2 in nonobese (HCC) 10/09/2020   Sepsis due to undetermined organism (HCC) 10/10/2020   Numbness and tingling of left hand 02/03/2021   Carpal tunnel  syndrome of left wrist 02/03/2021   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   Intractable nausea and vomiting 12/03/2022   Prolonged QT interval 12/03/2022   Resolved Ambulatory Problems    Diagnosis Date Noted   Chest pain    Fever 10/14/2014   Nausea & vomiting 10/14/2014   Lactic acidosis 10/14/2014   Diarrhea 10/14/2014   Nausea vomiting and diarrhea 10/15/2014   Gastritis and gastroduodenitis 10/25/2014   Healthcare maintenance 11/08/2014   Sepsis due to undetermined organism (HCC) 10/09/2020   Past Medical History:  Diagnosis Date   Anemia    Aneurysm (HCC)    Anxiety    Arthritis    Diabetes mellitus without complication (HCC)    GERD (gastroesophageal reflux disease)    Hypertension      ROS  Comprehensive ROS Pertinent positive and negative noted in HPI     Objective:   BP (!) 198/119 (BP Location: Left Arm, Patient Position: Sitting, Cuff Size: Small)   Pulse 86   Resp 16   Wt 105 lb 12.8 oz (48 kg)   SpO2 99%   BMI 19.99 kg/m    Physical Exam  General: No apparent distress. Eyes: Extraocular eye movements intact, pupils equal and round. Neck: Supple, trachea midline. Thyroid: No enlargement, mobile without fixation, no tenderness. Cardiovascular: Regular rhythm and rate, no murmur, normal radial pulses. Respiratory: Normal respiratory effort, clear to auscultation. Gastrointestinal: Normal pitch active bowel sounds, nontender abdomen  without distention or appreciable hepatomegaly. Musculoskeletal: Left arm is in a brace normal muscle tone, no tenderness on palpation of tibia, no excessive thoracic kyphosis. Skin: Appropriate warmth, no visible rash. Mental status: Alert, conversant, speech clear, thought logical, appropriate mood and affect, no hallucinations or delusions evident. Hematologic/lymphatic: No cervical adenopathy, no visible ecchymoses.  No results found for any visits on 05/03/22.  The ASCVD Risk score (Arnett DK, et al., 2019) failed to  calculate for the following reasons:   The systolic blood pressure is missing   Cannot find a previous HDL lab   Cannot find a previous total cholesterol lab    Assessment & Plan:  [name] was seen today for diabetes and hospitalization follow-up.  Diagnoses and all orders for this visit:  Diabetes mellitus type 2 in nonobese (HCC) -     Microalbumin / creatinine urine ratio -     Cancel: CBC with Differential/Platelet -     POCT glycosylated hemoglobin (Hb A1C) -     CBC with Differential/Platelet; Future  Medication refill -     amLODipine (NORVASC) 10 MG tablet; Take 1 tablet (10 mg total) by mouth daily. -     losartan-hydrochlorothiazide (HYZAAR) 100-25 MG tablet; Take 1 tablet by mouth daily.  Essential hypertension -     Cancel: CMP14+EGFR -     amLODipine (NORVASC) 10 MG tablet; Take 1 tablet (10 mg total) by mouth daily. -     losartan-hydrochlorothiazide (HYZAAR) 100-25 MG tablet; Take 1 tablet by mouth daily. -     CMP14+EGFR; Future  Mixed hyperlipidemia -     Cancel: Lipid panel -     rosuvastatin (CRESTOR) 40 MG tablet; Take 1 tablet (40 mg total) by mouth daily. -     Lipid panel; Future  Encounter for immunization -     Flu Vaccine Trivalent High Dose (Fluad)  Other orders -     Zoster Vaccine Adjuvanted [facility]) injection; Inject 0.5 mLs into the muscle once for 1 dose. -     DULoxetine (CYMBALTA) 30 MG capsule; Take 1 capsule (30 mg total) by mouth daily. -     CMP14+EGFR -     CBC with Differential/Platelet -     Lipid panel     [name], NP

## 2023-06-06 ENCOUNTER — Ambulatory Visit: Payer: Medicare Other | Admitting: Internal Medicine

## 2023-06-10 ENCOUNTER — Ambulatory Visit: Payer: Medicare Other | Admitting: Cardiology

## 2023-06-12 ENCOUNTER — Ambulatory Visit (INDEPENDENT_AMBULATORY_CARE_PROVIDER_SITE_OTHER): Payer: Medicare Other | Admitting: Podiatry

## 2023-06-12 DIAGNOSIS — L84 Corns and callosities: Secondary | ICD-10-CM | POA: Diagnosis not present

## 2023-06-12 DIAGNOSIS — G629 Polyneuropathy, unspecified: Secondary | ICD-10-CM | POA: Diagnosis not present

## 2023-06-12 DIAGNOSIS — E119 Type 2 diabetes mellitus without complications: Secondary | ICD-10-CM

## 2023-06-12 DIAGNOSIS — B351 Tinea unguium: Secondary | ICD-10-CM

## 2023-06-12 DIAGNOSIS — M79675 Pain in left toe(s): Secondary | ICD-10-CM | POA: Diagnosis not present

## 2023-06-12 DIAGNOSIS — M79674 Pain in right toe(s): Secondary | ICD-10-CM

## 2023-06-12 NOTE — Progress Notes (Signed)
  Subjective:  Patient ID: [name] Session, female    DOB: 1955-09-17,   MRN: [id number]  No chief complaint on file.   67 y.o. female presents for concern of continue numbness in the feet. Relates today not doing so bad.   Relates burning and tingling in their feet. Relates hallux on right is still doing well. Also hoping to have callus trimmed as well. Patient is diabetic and last A1c was  Lab Results  Component Value Date   HGBA1C 6.4 05/02/2023   .   PCP:  [name], NP    . Denies any other pedal complaints. Denies n/v/f/c.   Past Medical History:  Diagnosis Date   Anemia    hx   Aneurysm (HCC)    small, on left side of brain    Anxiety    hx of   Arthritis    generalized   Diabetes mellitus without complication (HCC)    diet control, pt denies- on medications   GERD (gastroesophageal reflux disease)    iwht certain foods/on meds/OTC PRN meds   Hyperlipidemia    on meds   Hypertension    on meds   TIA (transient ischemic attack)    pt unaware of this hx on 06/19/2017    Objective:  Physical Exam: Vascular: DP/PT pulses 2/4 bilateral. CFT <3 seconds. Absent hair growth on digits. Edema noted to bilateral lower extremities. Xerosis noted bilaterally.  Skin. No lacerations or abrasions bilateral feet. Nails 1-5 bilateral  are thickened discolored and  with subungual debris. Hyperkeratotic tissue noted left medial hallux.    Musculoskeletal: MMT 5/5 bilateral lower extremities in DF, PF, Inversion and Eversion. Deceased ROM in DF of ankle joint. 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 Tender to hallux IPJ. No pain around MPJ or lesser digits.  Neurological: Sensation intact to light touch. Protective sensation diminished bilateral.    Assessment:   1. Corns and callosities   2. Diabetes mellitus type 2 in nonobese (HCC)   3. Neuropathy       Plan:  Patient was evaluated and treated and all questions answered. Discussed neuropathy and  etiology as well as treatment with patient.  Radiographs reviewed and discussed with patient. No acute fractures or dislocations. Moderate bunion deformity on right and recurrence of bunion on left with retained hardware.  -Discussed and educated patient on diabetic foot care, especially with  regards to the vascular, neurological and musculoskeletal systems.  -Stressed the importance of good glycemic control and the detriment of not  controlling glucose levels in relation to the foot. -Discussed supportive shoes at all times and checking feet regularly.  -Hyperkeratotic tissue debrided without incident.  -Nails 1-5 bilateral debrided with nail nipper without incident.  -Patient to return in 4 months for rfc. Louann Sjogren, DPM

## 2023-06-12 NOTE — Addendum Note (Signed)
Addended by: Louann Sjogren R on: 06/12/2023 09:02 AM   Modules accepted: Level of Service

## 2023-06-23 ENCOUNTER — Inpatient Hospital Stay (HOSPITAL_COMMUNITY)
Admission: EM | Admit: 2023-06-23 | Discharge: 2023-07-10 | DRG: 234 | Disposition: A | Discharge status: Home Health | Payer: Medicare Other | Attending: Thoracic Surgery (Cardiothoracic Vascular Surgery) | Admitting: Thoracic Surgery (Cardiothoracic Vascular Surgery)

## 2023-06-23 ENCOUNTER — Emergency Department (HOSPITAL_COMMUNITY): Payer: Medicare Other

## 2023-06-23 DIAGNOSIS — I671 Cerebral aneurysm, nonruptured: Secondary | ICD-10-CM | POA: Diagnosis present

## 2023-06-23 DIAGNOSIS — R7989 Other specified abnormal findings of blood chemistry: Secondary | ICD-10-CM

## 2023-06-23 DIAGNOSIS — I255 Ischemic cardiomyopathy: Secondary | ICD-10-CM | POA: Diagnosis present

## 2023-06-23 DIAGNOSIS — Z8261 Family history of arthritis: Secondary | ICD-10-CM

## 2023-06-23 DIAGNOSIS — J9811 Atelectasis: Secondary | ICD-10-CM | POA: Diagnosis not present

## 2023-06-23 DIAGNOSIS — I169 Hypertensive crisis, unspecified: Secondary | ICD-10-CM | POA: Diagnosis present

## 2023-06-23 DIAGNOSIS — I252 Old myocardial infarction: Secondary | ICD-10-CM

## 2023-06-23 DIAGNOSIS — Z981 Arthrodesis status: Secondary | ICD-10-CM

## 2023-06-23 DIAGNOSIS — E782 Mixed hyperlipidemia: Secondary | ICD-10-CM | POA: Diagnosis present

## 2023-06-23 DIAGNOSIS — I160 Hypertensive urgency: Principal | ICD-10-CM

## 2023-06-23 DIAGNOSIS — F17210 Nicotine dependence, cigarettes, uncomplicated: Secondary | ICD-10-CM | POA: Diagnosis present

## 2023-06-23 DIAGNOSIS — E876 Hypokalemia: Secondary | ICD-10-CM | POA: Diagnosis present

## 2023-06-23 DIAGNOSIS — I25110 Atherosclerotic heart disease of native coronary artery with unstable angina pectoris: Principal | ICD-10-CM | POA: Diagnosis present

## 2023-06-23 DIAGNOSIS — Z8249 Family history of ischemic heart disease and other diseases of the circulatory system: Secondary | ICD-10-CM

## 2023-06-23 DIAGNOSIS — R079 Chest pain, unspecified: Secondary | ICD-10-CM | POA: Diagnosis not present

## 2023-06-23 DIAGNOSIS — I5022 Chronic systolic (congestive) heart failure: Secondary | ICD-10-CM | POA: Diagnosis present

## 2023-06-23 DIAGNOSIS — Z91148 Patient's other noncompliance with medication regimen for other reason: Secondary | ICD-10-CM

## 2023-06-23 DIAGNOSIS — D62 Acute posthemorrhagic anemia: Secondary | ICD-10-CM | POA: Diagnosis not present

## 2023-06-23 DIAGNOSIS — E1142 Type 2 diabetes mellitus with diabetic polyneuropathy: Secondary | ICD-10-CM | POA: Diagnosis present

## 2023-06-23 DIAGNOSIS — Z8673 Personal history of transient ischemic attack (TIA), and cerebral infarction without residual deficits: Secondary | ICD-10-CM

## 2023-06-23 DIAGNOSIS — Z7984 Long term (current) use of oral hypoglycemic drugs: Secondary | ICD-10-CM

## 2023-06-23 DIAGNOSIS — I444 Left anterior fascicular block: Secondary | ICD-10-CM | POA: Diagnosis present

## 2023-06-23 DIAGNOSIS — I110 Hypertensive heart disease with heart failure: Secondary | ICD-10-CM | POA: Diagnosis present

## 2023-06-23 DIAGNOSIS — Z91141 Patient's other noncompliance with medication regimen due to financial hardship: Secondary | ICD-10-CM

## 2023-06-23 DIAGNOSIS — I200 Unstable angina: Secondary | ICD-10-CM | POA: Diagnosis present

## 2023-06-23 DIAGNOSIS — Z79899 Other long term (current) drug therapy: Secondary | ICD-10-CM

## 2023-06-23 DIAGNOSIS — I5020 Unspecified systolic (congestive) heart failure: Secondary | ICD-10-CM

## 2023-06-23 DIAGNOSIS — I161 Hypertensive emergency: Secondary | ICD-10-CM | POA: Diagnosis present

## 2023-06-23 DIAGNOSIS — Z7982 Long term (current) use of aspirin: Secondary | ICD-10-CM

## 2023-06-23 DIAGNOSIS — I1A0 Resistant hypertension: Secondary | ICD-10-CM | POA: Diagnosis present

## 2023-06-23 DIAGNOSIS — I2510 Atherosclerotic heart disease of native coronary artery without angina pectoris: Secondary | ICD-10-CM

## 2023-06-23 DIAGNOSIS — M159 Polyosteoarthritis, unspecified: Secondary | ICD-10-CM | POA: Diagnosis present

## 2023-06-23 DIAGNOSIS — K219 Gastro-esophageal reflux disease without esophagitis: Secondary | ICD-10-CM | POA: Diagnosis present

## 2023-06-23 DIAGNOSIS — F0390 Unspecified dementia without behavioral disturbance: Secondary | ICD-10-CM | POA: Diagnosis present

## 2023-06-23 DIAGNOSIS — Z833 Family history of diabetes mellitus: Secondary | ICD-10-CM

## 2023-06-23 DIAGNOSIS — I2489 Other forms of acute ischemic heart disease: Secondary | ICD-10-CM | POA: Diagnosis present

## 2023-06-23 DIAGNOSIS — Z951 Presence of aortocoronary bypass graft: Secondary | ICD-10-CM

## 2023-06-23 LAB — BASIC METABOLIC PANEL
Anion gap: 11 (ref 5–15)
BUN: 14 mg/dL (ref 8–23)
CO2: 24 mmol/L (ref 22–32)
Calcium: 9.6 mg/dL (ref 8.9–10.3)
Chloride: 106 mmol/L (ref 98–111)
Creatinine, Ser: 0.64 mg/dL (ref 0.44–1.00)
GFR, Estimated: >60 mL/min (ref >60)
Glucose, Bld: 94 mg/dL (ref 70–99)
Potassium: 3.4 mmol/L — ABNORMAL LOW (ref 3.5–5.1)
Sodium: 141 mmol/L (ref 135–145)

## 2023-06-23 LAB — CBC
HCT: 36 % (ref 36.0–46.0)
Hemoglobin: 11.8 g/dL — ABNORMAL LOW (ref 12.0–15.0)
MCH: 28.3 pg (ref 26.0–34.0)
MCHC: 32.8 g/dL (ref 30.0–36.0)
MCV: 86.3 fL (ref 80.0–100.0)
Platelets: 348 10*3/uL (ref 150–400)
RBC: 4.17 MIL/uL (ref 3.87–5.11)
RDW: 14.4 % (ref 11.5–15.5)
WBC: 11.3 10*3/uL — ABNORMAL HIGH (ref 4.0–10.5)
nRBC: 0 % (ref 0.0–0.2)

## 2023-06-23 LAB — TROPONIN I (HIGH SENSITIVITY): Troponin I (High Sensitivity): 81 ng/L — ABNORMAL HIGH (ref <18)

## 2023-06-23 LAB — CBG MONITORING, ED: Glucose-Capillary: 94 mg/dL (ref 70–99)

## 2023-06-23 NOTE — ED Provider Triage Note (Signed)
Emergency Medicine Provider Triage Evaluation Note  [name] , a 67 y.o. female  was evaluated in triage.  Pt complains of chest pain. Pain to mid chest radiates to R shoulder x 4 days.  Improves with sitting up.  No fever, chills, lighthead/dizzy, sob, cough, abd pain  Review of Systems  Positive: As above Negative: As above  Physical Exam  BP (!) 193/111 (BP Location: Left Arm)   Pulse 83   Temp 98.1 F (36.7 C) (Oral)   Resp 17   SpO2 99%  Gen:   Awake, no distress   Resp:  Normal effort  MSK:   Moves extremities without difficulty  Other:    Medical Decision Making  Medically screening exam initiated at 7:45 PM.  Appropriate orders placed.  [name] was informed that the remainder of the evaluation will be completed by another provider, this initial triage assessment does not replace that evaluation, and the importance of remaining in the ED until their evaluation is complete.     Fayrene Helper, PA-C 06/23/23 1946

## 2023-06-23 NOTE — ED Triage Notes (Signed)
Patient arrived with complaints of intermittent central chest pain over the last few days. Worsens when laying down. States she has had lots of life stressors and has had difficulty getting her medications.

## 2023-06-24 ENCOUNTER — Encounter (HOSPITAL_COMMUNITY): Payer: Self-pay | Admitting: Family Medicine

## 2023-06-24 ENCOUNTER — Encounter (INDEPENDENT_AMBULATORY_CARE_PROVIDER_SITE_OTHER): Payer: Medicare Other | Admitting: Primary Care

## 2023-06-24 ENCOUNTER — Observation Stay (HOSPITAL_BASED_OUTPATIENT_CLINIC_OR_DEPARTMENT_OTHER): Payer: Medicare Other

## 2023-06-24 ENCOUNTER — Other Ambulatory Visit: Payer: Self-pay

## 2023-06-24 DIAGNOSIS — R079 Chest pain, unspecified: Secondary | ICD-10-CM | POA: Diagnosis not present

## 2023-06-24 DIAGNOSIS — I160 Hypertensive urgency: Principal | ICD-10-CM | POA: Diagnosis present

## 2023-06-24 DIAGNOSIS — I169 Hypertensive crisis, unspecified: Secondary | ICD-10-CM | POA: Diagnosis not present

## 2023-06-24 DIAGNOSIS — R7989 Other specified abnormal findings of blood chemistry: Secondary | ICD-10-CM | POA: Diagnosis not present

## 2023-06-24 DIAGNOSIS — I5020 Unspecified systolic (congestive) heart failure: Secondary | ICD-10-CM

## 2023-06-24 LAB — CREATININE, SERUM
Creatinine, Ser: 0.61 mg/dL (ref 0.44–1.00)
GFR, Estimated: >60 mL/min (ref >60)

## 2023-06-24 LAB — TROPONIN I (HIGH SENSITIVITY)
Troponin I (High Sensitivity): 94 ng/L — ABNORMAL HIGH (ref <18)
Troponin I (High Sensitivity): 77 ng/L — ABNORMAL HIGH (ref <18)

## 2023-06-24 LAB — GLUCOSE, CAPILLARY
Glucose-Capillary: 110 mg/dL — ABNORMAL HIGH (ref 70–99)
Glucose-Capillary: 144 mg/dL — ABNORMAL HIGH (ref 70–99)

## 2023-06-24 LAB — CBG MONITORING, ED
Glucose-Capillary: 141 mg/dL — ABNORMAL HIGH (ref 70–99)
Glucose-Capillary: 101 mg/dL — ABNORMAL HIGH (ref 70–99)

## 2023-06-24 LAB — CBC
HCT: 34.4 % — ABNORMAL LOW (ref 36.0–46.0)
HCT: 33.4 % — ABNORMAL LOW (ref 36.0–46.0)
Hemoglobin: 11.6 g/dL — ABNORMAL LOW (ref 12.0–15.0)
Hemoglobin: 11.1 g/dL — ABNORMAL LOW (ref 12.0–15.0)
MCH: 28.9 pg (ref 26.0–34.0)
MCH: 28.7 pg (ref 26.0–34.0)
MCHC: 33.7 g/dL (ref 30.0–36.0)
MCHC: 33.2 g/dL (ref 30.0–36.0)
MCV: 85.8 fL (ref 80.0–100.0)
MCV: 86.3 fL (ref 80.0–100.0)
Platelets: 332 10*3/uL (ref 150–400)
Platelets: 309 10*3/uL (ref 150–400)
RBC: 4.01 MIL/uL (ref 3.87–5.11)
RBC: 3.87 MIL/uL (ref 3.87–5.11)
RDW: 14.5 % (ref 11.5–15.5)
RDW: 14.6 % (ref 11.5–15.5)
WBC: 10.8 10*3/uL — ABNORMAL HIGH (ref 4.0–10.5)
WBC: 10.3 10*3/uL (ref 4.0–10.5)
nRBC: 0 % (ref 0.0–0.2)
nRBC: 0 % (ref 0.0–0.2)

## 2023-06-24 LAB — ECHOCARDIOGRAM COMPLETE
AR max vel: 1.68 cm2
AV Area VTI: 1.78 cm2
AV Area mean vel: 1.88 cm2
AV Mean grad: 4 mm[Hg]
AV Peak grad: 7.6 mm[Hg]
Ao pk vel: 1.38 m/s
Area-P 1/2: 4.49 cm2
Calc EF: 38.8 %
MV M vel: 5.9 m/s
MV Peak grad: 139.2 mm[Hg]
S' Lateral: 4.1 cm
Single Plane A2C EF: 37.6 %
Single Plane A4C EF: 39.9 %
Weight: 2052.8 [oz_av]

## 2023-06-24 LAB — PHOSPHORUS: Phosphorus: 3.6 mg/dL (ref 2.5–4.6)

## 2023-06-24 LAB — BASIC METABOLIC PANEL
Anion gap: 8 (ref 5–15)
BUN: 11 mg/dL (ref 8–23)
CO2: 23 mmol/L (ref 22–32)
Calcium: 8.9 mg/dL (ref 8.9–10.3)
Chloride: 106 mmol/L (ref 98–111)
Creatinine, Ser: 0.52 mg/dL (ref 0.44–1.00)
GFR, Estimated: >60 mL/min (ref >60)
Glucose, Bld: 121 mg/dL — ABNORMAL HIGH (ref 70–99)
Potassium: 3 mmol/L — ABNORMAL LOW (ref 3.5–5.1)
Sodium: 137 mmol/L (ref 135–145)

## 2023-06-24 LAB — MAGNESIUM: Magnesium: 2.1 mg/dL (ref 1.7–2.4)

## 2023-06-24 MED ORDER — MORPHINE SULFATE (PF) 2 MG/ML IV SOLN
2.0000 mg | INTRAVENOUS | Status: DC | PRN
  Administered 2023-06-24: 2 mg via INTRAVENOUS
  Filled 2023-06-24: qty 1

## 2023-06-24 MED ORDER — DULOXETINE HCL 30 MG PO CPEP
30.0000 mg | ORAL_CAPSULE | Freq: Every day | ORAL | Status: DC
  Administered 2023-06-24 – 2023-07-10 (×16): 30 mg via ORAL
  Filled 2023-06-24 (×16): qty 1

## 2023-06-24 MED ORDER — NITROGLYCERIN 2 % TD OINT
1.0000 [in_us] | TOPICAL_OINTMENT | Freq: Once | TRANSDERMAL | Status: AC
  Administered 2023-06-24: 1 [in_us] via TOPICAL
  Filled 2023-06-24: qty 1

## 2023-06-24 MED ORDER — LOSARTAN POTASSIUM-HCTZ 100-25 MG PO TABS: 1.0000 | ORAL_TABLET | Freq: Every day | ORAL | Status: DC

## 2023-06-24 MED ORDER — POLYETHYLENE GLYCOL 3350 17 G PO PACK: 17.0000 g | PACK | Freq: Every day | ORAL | Status: DC | PRN

## 2023-06-24 MED ORDER — ROSUVASTATIN CALCIUM 20 MG PO TABS
40.0000 mg | ORAL_TABLET | Freq: Every day | ORAL | Status: DC
  Administered 2023-06-24 – 2023-07-10 (×16): 40 mg via ORAL
  Filled 2023-06-24 (×16): qty 2

## 2023-06-24 MED ORDER — INSULIN ASPART 100 UNIT/ML IJ SOLN
0.0000 [IU] | Freq: Three times a day (TID) | INTRAMUSCULAR | Status: DC
  Administered 2023-06-24 – 2023-06-27 (×3): 1 [IU] via SUBCUTANEOUS
  Administered 2023-06-27: 2 [IU] via SUBCUTANEOUS
  Administered 2023-06-28: 1 [IU] via SUBCUTANEOUS
  Administered 2023-06-29: 2 [IU] via SUBCUTANEOUS
  Administered 2023-06-29 – 2023-06-30 (×2): 1 [IU] via SUBCUTANEOUS
  Administered 2023-06-30: 2 [IU] via SUBCUTANEOUS
  Administered 2023-07-01 (×2): 1 [IU] via SUBCUTANEOUS
  Administered 2023-07-02: 2 [IU] via SUBCUTANEOUS
  Administered 2023-07-02: 1 [IU] via SUBCUTANEOUS
  Filled 2023-06-24: qty 0.09

## 2023-06-24 MED ORDER — AMLODIPINE BESYLATE 10 MG PO TABS
10.0000 mg | ORAL_TABLET | Freq: Every day | ORAL | Status: DC
Start: 2023-06-24 — End: 2023-07-03
  Administered 2023-06-24 – 2023-07-02 (×9): 10 mg via ORAL
  Filled 2023-06-24 (×6): qty 1
  Filled 2023-06-24: qty 2
  Filled 2023-06-24 (×2): qty 1

## 2023-06-24 MED ORDER — ENOXAPARIN SODIUM 40 MG/0.4ML IJ SOSY
40.0000 mg | PREFILLED_SYRINGE | INTRAMUSCULAR | Status: DC
  Administered 2023-06-24 – 2023-06-25 (×2): 40 mg via SUBCUTANEOUS
  Filled 2023-06-24 (×4): qty 0.4

## 2023-06-24 MED ORDER — HYDRALAZINE HCL 20 MG/ML IJ SOLN
10.0000 mg | Freq: Three times a day (TID) | INTRAMUSCULAR | Status: DC | PRN
  Administered 2023-06-24 – 2023-06-27 (×2): 10 mg via INTRAVENOUS
  Filled 2023-06-24 (×2): qty 1

## 2023-06-24 MED ORDER — LOSARTAN POTASSIUM 50 MG PO TABS
100.0000 mg | ORAL_TABLET | Freq: Every day | ORAL | Status: DC
  Administered 2023-06-24: 100 mg via ORAL
  Filled 2023-06-24: qty 4

## 2023-06-24 MED ORDER — MELATONIN 5 MG PO TABS
5.0000 mg | ORAL_TABLET | Freq: Every evening | ORAL | Status: DC | PRN
  Administered 2023-07-01 – 2023-07-02 (×2): 5 mg via ORAL
  Filled 2023-06-24 (×2): qty 1

## 2023-06-24 MED ORDER — HYDROCHLOROTHIAZIDE 25 MG PO TABS
25.0000 mg | ORAL_TABLET | Freq: Every day | ORAL | Status: DC
  Administered 2023-06-24: 25 mg via ORAL
  Filled 2023-06-24 (×2): qty 1

## 2023-06-24 MED ORDER — IRBESARTAN 150 MG PO TABS
300.0000 mg | ORAL_TABLET | Freq: Every day | ORAL | Status: DC
  Administered 2023-06-24 – 2023-06-28 (×5): 300 mg via ORAL
  Filled 2023-06-24: qty 2
  Filled 2023-06-24 (×4): qty 1

## 2023-06-24 MED ORDER — ONDANSETRON HCL 4 MG/2ML IJ SOLN
4.0000 mg | Freq: Once | INTRAMUSCULAR | Status: AC
  Administered 2023-06-24: 4 mg via INTRAVENOUS
  Filled 2023-06-24: qty 2

## 2023-06-24 MED ORDER — POTASSIUM CHLORIDE 20 MEQ PO PACK
40.0000 meq | PACK | Freq: Once | ORAL | Status: AC
  Administered 2023-06-24: 40 meq via ORAL
  Filled 2023-06-24: qty 2

## 2023-06-24 MED ORDER — CARVEDILOL 6.25 MG PO TABS
6.2500 mg | ORAL_TABLET | Freq: Two times a day (BID) | ORAL | Status: DC
Start: 2023-06-24 — End: 2023-07-03
  Administered 2023-06-24 – 2023-07-02 (×17): 6.25 mg via ORAL
  Filled 2023-06-24 (×17): qty 1

## 2023-06-24 MED ORDER — HYDRALAZINE HCL 20 MG/ML IJ SOLN
10.0000 mg | Freq: Once | INTRAMUSCULAR | Status: AC
  Administered 2023-06-24: 10 mg via INTRAVENOUS
  Filled 2023-06-24: qty 1

## 2023-06-24 MED ORDER — ACETAMINOPHEN 325 MG PO TABS
650.0000 mg | ORAL_TABLET | Freq: Four times a day (QID) | ORAL | Status: DC | PRN
  Administered 2023-06-24 – 2023-06-28 (×2): 650 mg via ORAL
  Filled 2023-06-24 (×3): qty 2

## 2023-06-24 MED ORDER — METOPROLOL SUCCINATE ER 25 MG PO TB24
12.5000 mg | ORAL_TABLET | Freq: Every day | ORAL | Status: DC
  Administered 2023-06-24: 12.5 mg via ORAL
  Filled 2023-06-24: qty 1

## 2023-06-24 NOTE — Progress Notes (Signed)
   06/24/23 1452  TOC Brief Assessment  Insurance and Status Reviewed  Patient has primary care physician Yes  Home environment has been reviewed Private  Prior level of function: Independent  Prior/Current Home Services No current home services  Social Drivers of Health Review SDOH reviewed no interventions necessary  Readmission risk has been reviewed Yes  Transition of care needs no transition of care needs at this time

## 2023-06-24 NOTE — Care Management CC44 (Signed)
Condition Code 44 Documentation Completed  Patient Details  Name: [name] MRN: [id number] Date of Birth: May 15, 1956   Condition Code 44 given:  Yes Patient signature on Condition Code 44 notice:  Yes Documentation of 2 MD's agreement:  Yes Code 44 added to claim:  Yes    Geni Bers, RN 06/24/2023, 2:41 PM

## 2023-06-24 NOTE — ED Notes (Signed)
CBG - 141 ° °

## 2023-06-24 NOTE — ED Notes (Signed)
ED TO INPATIENT HANDOFF REPORT  Name/Age/Gender [name] 67 y.o. female  Code Status    Code Status Orders  (From admission, onward)           Start     Ordered   06/24/23 0202  Full code  Continuous       Question:  By:  Answer:  Consent: discussion documented in EHR   06/24/23 0202           Code Status History     Date Active Date Inactive Code Status Order ID Comments User Context   12/03/2022 0342 12/05/2022 1802 Full Code [id number]  Darlin Drop, DO ED   10/09/2020 1155 10/13/2020 1811 Full Code [id number]  Jonah Blue, MD ED   05/17/2018 1120 05/18/2018 1337 Full Code [id number]  Alessandra Bevels, MD ED   06/18/2017 1530 06/22/2017 1832 Full Code [id number]  Marcos [name], PA-C ED   10/14/2014 2113 10/16/2014 1354 Full Code [id number]  Haydee Monica, MD Inpatient   09/21/2014 1210 09/22/2014 0335 Full Code [id number]  Lisbeth Renshaw, MD HOV   08/19/2014 2003 08/21/2014 1541 Full Code [id number]  Edsel Petrin, DO ED       Home/SNF/Other Home  Chief Complaint Hypertensive crisis [I16.9] Hypertensive urgency [I16.0]  Level of Care/Admitting Diagnosis ED Disposition     ED Disposition  Admit   Condition  --   Comment  Hospital Area: [facility] [[id number]]  Level of Care: Telemetry [5]  Admit to tele based on following criteria: Monitor for Ischemic changes  May place patient in observation at [facility] or [facility] if equivalent level of care is available:: No  Covid Evaluation: Asymptomatic - no recent exposure (last 10 days) testing not required  Diagnosis: Hypertensive urgency [650126]  Admitting Physician: Alberteen Sam [[id number]]  Attending Physician: Alberteen Sam [[id number]]          Medical History Past Medical History:  Diagnosis Date   Anemia    hx   Aneurysm (HCC)    small, on left side of brain    Anxiety    hx of   Arthritis    generalized   Diabetes mellitus without  complication (HCC)    diet control, pt denies- on medications   GERD (gastroesophageal reflux disease)    iwht certain foods/on meds/OTC PRN meds   Hyperlipidemia    on meds   Hypertension    on meds   TIA (transient ischemic attack)    pt unaware of this hx on 06/19/2017    Allergies No Known Allergies  IV Location/Drains/Wounds Patient Lines/Drains/Airways Status     Active Line/Drains/Airways     Name Placement date Placement time Site Days   Peripheral IV 06/24/23 20 G Left Antecubital 06/24/23  0127  Antecubital  less than 1            Labs/Imaging Results for orders placed or performed during the hospital encounter of 06/23/23 (from the past 48 hours)  Basic metabolic panel     Status: Abnormal   Collection Time: 06/23/23  7:57 PM  Result Value Ref Range   Sodium 141 135 - 145 mmol/L   Potassium 3.4 (L) 3.5 - 5.1 mmol/L   Chloride 106 98 - 111 mmol/L   CO2 24 22 - 32 mmol/L   Glucose, Bld 94 70 - 99 mg/dL    Comment: Glucose reference range applies only to samples taken after fasting for at least 8 hours.  BUN 14 8 - 23 mg/dL   Creatinine, Ser 3.32 0.44 - 1.00 mg/dL   Calcium 9.6 8.9 - 95.1 mg/dL   GFR, Estimated >88 >41 mL/min    Comment: (NOTE) Calculated using the CKD-EPI Creatinine Equation (2021)    Anion gap 11 5 - 15    Comment: Performed at [facility], [address] [place], [place] [address]  CBC     Status: Abnormal   Collection Time: 06/23/23  7:57 PM  Result Value Ref Range   WBC 11.3 (H) 4.0 - 10.5 K/uL   RBC 4.17 3.87 - 5.11 MIL/uL   Hemoglobin 11.8 (L) 12.0 - 15.0 g/dL   HCT 01.6 01.0 - 93.2 %   MCV 86.3 80.0 - 100.0 fL   MCH 28.3 26.0 - 34.0 pg   MCHC 32.8 30.0 - 36.0 g/dL   RDW 35.5 73.2 - 20.2 %   Platelets 348 150 - 400 K/uL   nRBC 0.0 0.0 - 0.2 %    Comment: Performed at [facility], [address], [place], [place] [address]  Troponin I (High Sensitivity)     Status: Abnormal    Collection Time: 06/23/23  7:57 PM  Result Value Ref Range   Troponin I (High Sensitivity) 81 (H) <18 ng/L    Comment: (NOTE) Elevated high sensitivity troponin I (hsTnI) values and significant  changes across serial measurements may suggest ACS but many other  chronic and acute conditions are known to elevate hsTnI results.  Refer to the "Links" section for chest pain algorithms and additional  guidance. Performed at [facility], [address], [place], [place] [address]   CBG monitoring, ED     Status: None   Collection Time: 06/23/23  8:12 PM  Result Value Ref Range   Glucose-Capillary 94 70 - 99 mg/dL    Comment: Glucose reference range applies only to samples taken after fasting for at least 8 hours.  Troponin I (High Sensitivity)     Status: Abnormal   Collection Time: 06/23/23 11:13 PM  Result Value Ref Range   Troponin I (High Sensitivity) 94 (H) <18 ng/L    Comment: (NOTE) Elevated high sensitivity troponin I (hsTnI) values and significant  changes across serial measurements may suggest ACS but many other  chronic and acute conditions are known to elevate hsTnI results.  Refer to the "Links" section for chest pain algorithms and additional  guidance. Performed at [facility], [address] [place], [place] [address]   CBC     Status: Abnormal   Collection Time: 06/24/23  2:40 AM  Result Value Ref Range   WBC 10.8 (H) 4.0 - 10.5 K/uL   RBC 4.01 3.87 - 5.11 MIL/uL   Hemoglobin 11.6 (L) 12.0 - 15.0 g/dL   HCT 17.6 (L) 16.0 - 73.7 %   MCV 85.8 80.0 - 100.0 fL   MCH 28.9 26.0 - 34.0 pg   MCHC 33.7 30.0 - 36.0 g/dL   RDW 10.6 26.9 - 48.5 %   Platelets 332 150 - 400 K/uL   nRBC 0.0 0.0 - 0.2 %    Comment: Performed at [facility], [address] [place], [place] [address]  Creatinine, serum     Status: None   Collection Time: 06/24/23  2:40 AM  Result Value Ref Range   Creatinine, Ser 0.61 0.44 - 1.00  mg/dL   GFR, Estimated >35 >00 mL/min    Comment: (NOTE) Calculated  using the CKD-EPI Creatinine Equation (2021) Performed at [facility], [address] [place], [place] [address]   Troponin I (High Sensitivity)     Status: Abnormal   Collection Time: 06/24/23  2:40 AM  Result Value Ref Range   Troponin I (High Sensitivity) 77 (H) <18 ng/L    Comment: (NOTE) Elevated high sensitivity troponin I (hsTnI) values and significant  changes across serial measurements may suggest ACS but many other  chronic and acute conditions are known to elevate hsTnI results.  Refer to the "Links" section for chest pain algorithms and additional  guidance. Performed at [facility], [address], [place], [place] [address]   CBC     Status: Abnormal   Collection Time: 06/24/23  6:34 AM  Result Value Ref Range   WBC 10.3 4.0 - 10.5 K/uL   RBC 3.87 3.87 - 5.11 MIL/uL   Hemoglobin 11.1 (L) 12.0 - 15.0 g/dL   HCT 29.5 (L) 62.1 - 30.8 %   MCV 86.3 80.0 - 100.0 fL   MCH 28.7 26.0 - 34.0 pg   MCHC 33.2 30.0 - 36.0 g/dL   RDW 65.7 84.6 - 96.2 %   Platelets 309 150 - 400 K/uL   nRBC 0.0 0.0 - 0.2 %    Comment: Performed at [facility], [address], [place], [place] [address]  Basic metabolic panel     Status: Abnormal   Collection Time: 06/24/23  6:34 AM  Result Value Ref Range   Sodium 137 135 - 145 mmol/L   Potassium 3.0 (L) 3.5 - 5.1 mmol/L   Chloride 106 98 - 111 mmol/L   CO2 23 22 - 32 mmol/L   Glucose, Bld 121 (H) 70 - 99 mg/dL    Comment: Glucose reference range applies only to samples taken after fasting for at least 8 hours.   BUN 11 8 - 23 mg/dL   Creatinine, Ser 1.32 0.44 - 1.00 mg/dL   Calcium 8.9 8.9 - 44.0 mg/dL   GFR, Estimated >10 >27 mL/min    Comment: (NOTE) Calculated using the CKD-EPI Creatinine Equation (2021)    Anion gap 8 5 - 15    Comment: Performed at [facility], [address],  [place], [place] [address]  Magnesium     Status: None   Collection Time: 06/24/23  6:34 AM  Result Value Ref Range   Magnesium 2.1 1.7 - 2.4 mg/dL    Comment: Performed at [facility], [address], [place], [place] [address]  Phosphorus     Status: None   Collection Time: 06/24/23  6:34 AM  Result Value Ref Range   Phosphorus 3.6 2.5 - 4.6 mg/dL    Comment: Performed at [facility], [address], [place], [place] [address]  CBG monitoring, ED     Status: Abnormal   Collection Time: 06/24/23  7:43 AM  Result Value Ref Range   Glucose-Capillary 141 (H) 70 - 99 mg/dL    Comment: Glucose reference range applies only to samples taken after fasting for at least 8 hours.   DG Chest 2 View Result Date: 06/23/2023 CLINICAL DATA:  Intermittent central chest pain for several days EXAM: CHEST - 2 VIEW COMPARISON:  12/02/2022 FINDINGS: Frontal and lateral views of the chest demonstrate an unremarkable cardiac silhouette. No airspace disease, effusion, or pneumothorax. No acute bony abnormalities. IMPRESSION: 1. No acute intrathoracic process. Electronically Signed   By: Casimiro Needle  Manson Passey M.D.   On: 06/23/2023 20:06    Pending Labs Unresulted Labs (From admission, onward)     Start     Ordered   07/01/23 0500  Creatinine, serum  (enoxaparin (LOVENOX)    CrCl >/= 30 ml/min)  Weekly,   R     Comments: while on enoxaparin therapy    06/24/23 0202            Vitals/Pain Today's Vitals   06/24/23 0430 06/24/23 0500 06/24/23 0700 06/24/23 0813  BP: (!) 169/85 (!) 143/87 135/81   Pulse: 89 78 73   Resp: 15 14 13    Temp: 98.6 F (37 C)   98.3 F (36.8 C)  TempSrc: Oral   Oral  SpO2: 98% 97% 96%   Weight:      PainSc:        Isolation Precautions No active isolations  Medications Medications  enoxaparin (LOVENOX) injection 40 mg (40 mg Subcutaneous Given 06/24/23 1021)  amLODipine (NORVASC) tablet 10 mg (10 mg Oral Given 06/24/23 0256)   rosuvastatin (CRESTOR) tablet 40 mg (40 mg Oral Given 06/24/23 1022)  metoprolol succinate (TOPROL-XL) 24 hr tablet 12.5 mg (12.5 mg Oral Given 06/24/23 0257)  acetaminophen (TYLENOL) tablet 650 mg (has no administration in time range)  morphine (PF) 2 MG/ML injection 2 mg (2 mg Intravenous Given 06/24/23 0255)  melatonin tablet 5 mg (has no administration in time range)  polyethylene glycol (MIRALAX / GLYCOLAX) packet 17 g (has no administration in time range)  insulin aspart (novoLOG) injection 0-9 Units (1 Units Subcutaneous Given 06/24/23 0807)  DULoxetine (CYMBALTA) DR capsule 30 mg (30 mg Oral Given 06/24/23 1022)  losartan (COZAAR) tablet 100 mg (100 mg Oral Given 06/24/23 1022)    And  hydrochlorothiazide (HYDRODIURIL) tablet 25 mg (has no administration in time range)  hydrALAZINE (APRESOLINE) injection 10 mg (10 mg Intravenous Given 06/24/23 0120)  nitroGLYCERIN (NITROGLYN) 2 % ointment 1 inch (1 inch Topical Given 06/24/23 0120)  potassium chloride (KLOR-CON) packet 40 mEq (40 mEq Oral Given 06/24/23 1021)    Mobility walks

## 2023-06-24 NOTE — Progress Notes (Signed)
  Echocardiogram 2D Echocardiogram has been performed.  [name] [name] 06/24/2023, 2:11 PM

## 2023-06-24 NOTE — Plan of Care (Signed)
  Problem: Education: Goal: Ability to describe self-care measures that may prevent or decrease complications (Diabetes Survival Skills Education) will improve Outcome: Progressing Goal: Individualized Educational Video(s) Outcome: Progressing   

## 2023-06-24 NOTE — Consult Note (Addendum)
Cardiology Consultation   Patient ID: [name] MRN: [id number]; DOB: 20-Feb-1956  Admit date: 06/23/2023 Date of Consult: 06/24/2023  PCP:  [name], NP   [facility] Providers Cardiologist:  Kristeen Miss, MD --> previously [place] (Dr. Melton Alar)    Patient Profile:   [name] is a 68 y.o. female with a hx of hypertension, DM, GERD, HLD, TIA, and small brain aneurysm who is being seen 06/24/2023 for the evaluation of chest pain in the setting of hypertensive emergency at the request of Dr. Idelle Leech.  History of Present Illness:   [name] previously followed in 2023 with [facility] for hypertension.  She does have a strong family history of heart disease as her father died of an MI prior to age ?55.  She has a small brain aneurysm and evidence of prior TIA on imaging in 2018.  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  She was last seen 05/2022.  She did have an echocardiogram 10/2021 that showed grade 1 DD.  I do not have a complete read for this.  She also underwent renal artery ultrasound that was negative for stenosis.  PTA: 10 mg amlodipine, 5 mg farxiga, losartan-hydrochlorothiazide 100-25 mg, 25 mg toprol, and 40 mg crestor  She presented to [facility] ED with chest pain in the setting of hypertensive emergency.  She was noted to have chest pain worse with palpation on exam.  She reports running out of her medications approximately 3 months ago and was unable to afford her refills.  Troponin elevated from 81-94 before downtrending to 77. Creatinine 0.52 with K3.0. CXR not concerning for CHF.  Cardiology consulted for chest pain in the setting of hypertensive emergency.  During my interview, she reports her mother passed away at age 45 three months ago. Her mother lived with her and shortly thereafter, she was displaced from her rental home. She is currently residing in a studio/hotel room while she finds more  permanent housing. She is also applying for social security. She reports her health declined over the last three months as she was not taking care of herself.   Four days ago, she experienced onset of chest pain in the center of her chest. This waxed and waned for 3 days prior to presentation, was 10/10 at [facility]. She experienced one episode of N/V on Friday. She denies diaphoresis, pleuritic chest pain, and recent viral illness. She reports she has been packing up her mother's things and has had increased stress.  She smokes cigarettes intermittently.   Past Medical History:  Diagnosis Date   Anemia    hx   Aneurysm (HCC)    small, on left side of brain    Anxiety    hx of   Arthritis    generalized   Diabetes mellitus without complication (HCC)    diet control, pt denies- on medications   GERD (gastroesophageal reflux disease)    iwht certain foods/on meds/OTC PRN meds   Hyperlipidemia    on meds   Hypertension    on meds   TIA (transient ischemic attack)    pt unaware of this hx on 06/19/2017    Past Surgical History:  Procedure Laterality Date   ANTERIOR CERVICAL DECOMP/DISCECTOMY FUSION     BACK SURGERY     CARPAL TUNNEL RELEASE Left 2024   CESAREAN SECTION  1986   FOOT SURGERY Left 11/2019   IR GENERIC HISTORICAL  09/21/2014   IR ANGIO INTRA EXTRACRAN  SEL INTERNAL CAROTID UNI L MOD SED 09/21/2014 Lisbeth Renshaw, MD [facility]   IR GENERIC HISTORICAL  09/21/2014   IR ANGIO INTRA EXTRACRAN SEL COM CAROTID INNOMINATE UNI R MOD SED 09/21/2014 Lisbeth Renshaw, MD [facility]   IR GENERIC HISTORICAL  09/21/2014   IR ANGIO VERTEBRAL SEL VERTEBRAL UNI L MOD SED 09/21/2014 Lisbeth Renshaw, MD [facility]   IR GENERIC HISTORICAL  09/21/2014   IR 3D INDEPENDENT WKST 09/21/2014 Lisbeth Renshaw, MD [facility]   LAPAROSCOPIC CHOLECYSTECTOMY     TUBAL LIGATION     TUBAL LIGATION       Home Medications:  Prior to Admission medications   Medication Sig Start  Date End Date Taking? Authorizing Provider  amLODipine (NORVASC) 10 MG tablet Take 1 tablet (10 mg total) by mouth daily. 05/02/23  Yes [name], NP  dapagliflozin propanediol (FARXIGA) 5 MG TABS tablet Take 1 tablet (5 mg total) by mouth daily. 03/08/23  Yes [name], NP  dicyclomine (BENTYL) 10 MG capsule Take 1 capsule (10 mg total) by mouth 3 (three) times daily before meals. 02/04/23  Yes Meryl Dare, MD  DULoxetine (CYMBALTA) 30 MG capsule Take 1 capsule (30 mg total) by mouth daily. 05/02/23  Yes [name], NP  loperamide (IMODIUM) 2 MG capsule Take 1 capsule (2 mg total) by mouth 4 (four) times daily as needed for diarrhea or loose stools. 12/06/22  Yes Jeanelle Malling, PA  losartan-hydrochlorothiazide (HYZAAR) 100-25 MG tablet Take 1 tablet by mouth daily. 05/02/23  Yes [name], NP  methocarbamol (ROBAXIN) 500 MG tablet Take 1 tablet (500 mg total) by mouth every 8 (eight) hours as needed for muscle spasms. 06/14/22  Yes Debby Freiberg, NP  metoprolol succinate (TOPROL-XL) 25 MG 24 hr tablet TAKE ONE TABLET (25MG  TOTAL) BY MOUTH DAILY AT 9AM 01/16/23  Yes Tolia, Sunit, DO  ondansetron (ZOFRAN) 4 MG tablet Take 1 tablet (4 mg total) by mouth every 8 (eight) hours as needed for nausea or vomiting. 02/04/23  Yes Meryl Dare, MD  ondansetron (ZOFRAN) 8 MG tablet Take one prior to each prep dose. 02/04/23  Yes Meryl Dare, MD  oxyCODONE-acetaminophen (PERCOCET/ROXICET) 5-325 MG tablet Take 1 tablet by mouth every 6 (six) hours as needed for moderate pain or severe pain. 10/17/22  Yes [provider]  pantoprazole (PROTONIX) 40 MG tablet TAKE ONE TABLET (40mg  total) BY MOUTH DAILY AT 9AM Patient taking differently: Take 40 mg by mouth daily as needed (for heartburn). 06/20/22  Yes Meryl Dare, MD  pregabalin (LYRICA) 50 MG capsule Take 50 mg by mouth 3 (three) times daily. 04/30/22  Yes [provider]  rosuvastatin (CRESTOR) 40 MG  tablet Take 1 tablet (40 mg total) by mouth daily. 05/02/23  Yes [name], NP  potassium chloride SA (KLOR-CON M) 20 MEQ tablet Take 2 tablets (40 mEq total) by mouth daily for 3 days. Patient not taking: Reported on 06/24/2023 12/05/22 05/02/23  Burnadette Pop, MD  promethazine-dextromethorphan (PROMETHAZINE-DM) 6.25-15 MG/5ML syrup Take 2.5 mLs by mouth 4 (four) times daily as needed for cough. Patient not taking: Reported on 06/24/2023 04/22/23   Rising, Lurena Joiner, [place]    Inpatient Medications: Scheduled Meds:  amLODipine  10 mg Oral Daily   DULoxetine  30 mg Oral Daily   enoxaparin (LOVENOX) injection  40 mg Subcutaneous Q24H   losartan  100 mg Oral Daily   And   hydrochlorothiazide  25 mg Oral Daily  insulin aspart  0-9 Units Subcutaneous TID WC   metoprolol succinate  12.5 mg Oral Daily   rosuvastatin  40 mg Oral Daily   Continuous Infusions:  PRN Meds: acetaminophen, melatonin, morphine injection, polyethylene glycol  Allergies:   No Known Allergies  Social History:   Social History   Socioeconomic History   Marital status: Widowed    Spouse name: Not on file   Number of children: 1   Years of education: Not on file   Highest education level: Some college, no degree  Occupational History   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      22-May-2023 mother passed away a month ago   Social Drivers of Corporate investment banker Strain: Low Risk  (May 22, 2023)    Overall Financial Resource Strain (CARDIA)    Difficulty of Paying Living Expenses: Not very hard  Food Insecurity: No Food Insecurity (06/24/2023)   Hunger Vital Sign    Worried About Running Out of Food in the Last Year: Never true    Ran Out of Food in the Last Year: Never true  Transportation Needs: No Transportation Needs (06/24/2023)   PRAPARE - Administrator, Civil Service (Medical): No    Lack of Transportation (Non-Medical): No  Physical Activity: Inactive (2023-05-22)   Exercise Vital Sign    Days of Exercise per Week: 0 days    Minutes of Exercise per Session: 0 min  Stress: Stress Concern Present (05-22-2023)   [employer] of Occupational Health - Occupational Stress Questionnaire    Feeling of Stress : Very much  Social Connections: Moderately Integrated (06/24/2023)   Social Connection and Isolation Panel [NHANES]    Frequency of Communication with Friends and Family: Once a week    Frequency of Social Gatherings with Friends and Family: More than three times a week    Attends Religious Services: 1 to 4 times per year    Active Member of [employer] or Organizations: Yes    Attends Banker Meetings: Never    Marital Status: Widowed  Recent Concern: Social Connections - Moderately Isolated (05-22-23)   Social Connection and Isolation Panel [NHANES]    Frequency of Communication with Friends and Family: More than three times a week    Frequency of Social Gatherings with Friends and Family: Once a week    Attends Religious Services: 1 to 4 times per year    Active Member of [employer] or Organizations: No    Attends Banker Meetings: Never    Marital Status: Widowed  Intimate Partner Violence: Not At Risk (06/24/2023)   Humiliation, Afraid, Rape, and Kick questionnaire    Fear of Current or Ex-Partner: No    Emotionally Abused: No    Physically Abused: No    Sexually Abused: No    Family History:    Family History  Problem  Relation Age of Onset   Hypertension Mother    Arthritis Mother    Diabetes Mother    Dementia Mother    Heart attack Father    Heart disease Father    Hypertension Sister    Diabetes Sister    Hypertension Sister    Heart disease Sister    Diabetes Sister    Hypertension Brother    Heart disease Brother    Diabetes Brother    Hypertension Brother    Hypertension Brother    Hypertension Brother    Cancer Neg Hx    Colon cancer Neg Hx    Colon polyps Neg Hx    Esophageal cancer Neg Hx    Rectal cancer Neg Hx    Stomach cancer Neg Hx    Breast cancer Neg Hx      ROS:  Please see the history of present illness.   All other ROS reviewed and negative.     Physical Exam/Data:   Vitals:   06/24/23 0813 06/24/23 1220 06/24/23 1254 06/24/23 1307  BP:   (!) 173/100 (!) 173/100  Pulse:   79 78  Resp:   18 18  Temp: 98.3 F (36.8 C) 98.1 F (36.7 C) 98.5 F (36.9 C) 98.5 F (36.9 C)  TempSrc: Oral Oral Oral Oral  SpO2:      Weight:    58.2 kg  Height:    5\' 1"  (1.549 m)   No intake or output data in the 24 hours ending 06/24/23 1356    06/24/2023    1:07 PM 06/24/2023    1:26 AM 05/02/2023   11:16 AM  Last 3 Weights  Weight (lbs) 128 lb 4.8 oz 128 lb 4.8 oz 106 lb  Weight (kg) 58.196 kg 58.196 kg 48.081 kg     Body mass index is 24.24 kg/m.  General:  Well nourished, well developed, in no acute distress HEENT: normal Neck: no JVD Vascular: No carotid bruits; Distal pulses 2+ bilaterally Cardiac:  normal S1, S2; RRR; no murmur - exquisitely tender in the center of her chest Lungs:  clear to auscultation bilaterally, no wheezing, rhonchi or rales  Abd: soft, nontender, no hepatomegaly  Ext: no edema Musculoskeletal:  No deformities, BUE and BLE strength normal and equal Skin: warm and dry  Neuro:  CNs 2-12 intact, no focal abnormalities noted Psych:  Normal affect   EKG:  The EKG was personally reviewed and demonstrates:  sinus rhythm with HR 94, LAFB,  lateral TWI Telemetry:  Telemetry was personally reviewed and demonstrates:  sinus rhythm in the 90s  Relevant CV Studies:  Echo pending  Laboratory Data:  High Sensitivity Troponin:   Recent Labs  Lab 06/23/23 1957 06/23/23 2313 06/24/23 0240  TROPONINIHS 81* 94* 77*     Chemistry Recent Labs  Lab 06/23/23 1957 06/24/23 0240 06/24/23 0634  NA 141  --  137  K 3.4*  --  3.0*  CL 106  --  106  CO2 24  --  23  GLUCOSE 94  --  121*  BUN 14  --  11  CREATININE 0.64 0.61 0.52  CALCIUM 9.6  --  8.9  MG  --   --  2.1  GFRNONAA >60 >60 >60  ANIONGAP 11  --  8    No results for input(s): "PROT", "ALBUMIN", "AST", "ALT", "ALKPHOS", "BILITOT" in the last 168 hours. Lipids No results for input(s): "CHOL", "TRIG", "HDL", "LABVLDL", "LDLCALC", "CHOLHDL" in the last 168 hours.  Hematology  Recent Labs  Lab 06/23/23 1957 06/24/23 0240 06/24/23 0634  WBC 11.3* 10.8* 10.3  RBC 4.17 4.01 3.87  HGB 11.8* 11.6* 11.1*  HCT 36.0 34.4* 33.4*  MCV 86.3 85.8 86.3  MCH 28.3 28.9 28.7  MCHC 32.8 33.7 33.2  RDW 14.4 14.5 14.6  PLT 348 332 309   Thyroid No results for input(s): "TSH", "FREET4" in the last 168 hours.  BNPNo results for input(s): "BNP", "PROBNP" in the last 168 hours.  DDimer No results for input(s): "DDIMER" in the last 168 hours.   Radiology/Studies:  DG Chest 2 View Result Date: 06/23/2023 CLINICAL DATA:  Intermittent central chest pain for several days EXAM: CHEST - 2 VIEW COMPARISON:  12/02/2022 FINDINGS: Frontal and lateral views of the chest demonstrate an unremarkable cardiac silhouette. No airspace disease, effusion, or pneumothorax. No acute bony abnormalities. IMPRESSION: 1. No acute intrathoracic process. Electronically Signed   By: Sharlet Salina M.D.   On: 06/23/2023 20:06     Assessment and Plan:   Chest pain - reproducible on palpations - hs troponin peaked at 97 before down-trending - EKG with lateral TWI - chest pain with typical and atypical  features, she is sedentary at baseline - she has risk factors for ACS, but also uncontrolled hypertension and MSK type pain with tenderness on palpation - echo pending - she has been under stress recently and with unstable housing - I think we should first get BP under control - if echo abnormal, may consider ischemic evaluation, if EF is preserved, would focus on HTN control first before pursuing ischemia workup    Hypertension Hypertensive urgency Medication nonadherence - BP 201/125 - ran out of medications 3 months ago due to affordability and mother passing away - currently on 10 mg amlodipine and 100-25 losartan-hydrochlorothiazide - suspect she will need a higher intensity ARB such as olmesartan - she has received the above medications along with nitroglycerin paste and BP still elevated to 160s - she continues to have chest pain - start 60 mg imdur with discontinuation of NTG paste - stop losartan and start 300 mg irbesartan tomorrow (olmesartan may be cheaper than irbesartan, but appreciate pharmD help with med selection) - will try to avoid TID dosed medications for now (hydralazine) - also consider transitioning toprol to coreg for better BP control - on 12.5 mg toprol, will transition to 6.25 mg coreg    Disposition Appreciate CM/SW help. She needs help with housing and medications, as well as low sodium food options.     Risk Assessment/Risk Scores:     TIMI Risk Score for Unstable Angina or Non-ST Elevation MI:   The patient's TIMI risk score is 4, which indicates a 20% risk of all cause mortality, new or recurrent myocardial infarction or need for urgent revascularization in the next 14 days.      For questions or updates, please contact [facility] Please consult [email or web address] for contact info under    Signed, Marcelino Duster, PA  06/24/2023 1:56 PM   Attending Note:   The patient was seen and examined.  Agree with assessment and plan as  noted above.  Changes made to the above note as needed.  Patient seen and independently examined with Bettina Gavia, PA .   We discussed all aspects of the encounter. I agree with the assessment and plan as stated above.    Hypertensive urgency.:  due to medication noncompliance.   She ran out of her medications approximately 3 months ago.  She has a longstanding history of hypertension that has been fairly well-controlled.  She was seen by Dr. Melton Alar at [facility] 1 year ago .   She is already feeling better with her blood pressure much better controlled.  I suspect will take several days to get her blood pressure back towards normal.   2.  Troponin elevation: Echocardiogram has been completed.  The preliminary review of the images shows an inferior lateral wall motion defect.  This could be due to markedly elevated blood pressure for the past 3 months although it could also represent coronary ischemia.  Will consider an ischemic workup once her blood pressure is under control.  3.  HFrEF:  she has severe hypokinesis / akinesis of her inferior / lateral wall.  May be due to long standing HTN . She will need an ischemia work up after her BP better controlled.      I have spent a total of 40 minutes with patient reviewing hospital  notes , telemetry, EKGs, labs and examining patient as well as establishing an assessment and plan that was discussed with the patient.  > 50% of time was spent in direct patient care.    Vesta Mixer, Montez Hageman., MD, [facility] 06/24/2023, 3:04 PM [address],  [address] Office [phone] Pager [phone]

## 2023-06-24 NOTE — H&P (Addendum)
History and Physical  [name] WNU:[id number] DOB: 03-31-1956 DOA: 06/23/2023  Referring physician: Claris Che  PCP: [name], NP  Outpatient Specialists: GI, orthopedic surgery, general surgery. Patient coming from: Home.  Chief Complaint: Chest pain  HPI: [name] is a 67 y.o. female with medical history significant for type 2 diabetes, diabetic polyneuropathy, hypertension, hyperlipidemia, who presents to the ED with complaints of nonexertional chest pain, centrally located, reproducible with palpation, intermittent for the past 3 to 4 days.  Ran out of her home medications 3 months ago, states she has not been able to afford them.  In the ED, severely hypertensive with SBP 201 and DBP 125.  Nitropaste applied.  Lab studies notable for elevated troponin, peaked at 94 and down trended.  EDP discussed the case with cardiology on-call who recommended treatment for uncontrolled hypertension and transfer to [facility] for further evaluation.  Admitted by [facility], hospitalist service.  ED Course: Temperature 98.6.  BP 165/103, pulse 78, respiration rate 14, O2 saturation 97% on room air.  Review of Systems: Review of systems as noted in the HPI. All other systems reviewed and are negative.   Past Medical History:  Diagnosis Date   Anemia    hx   Aneurysm (HCC)    small, on left side of brain    Anxiety    hx of   Arthritis    generalized   Diabetes mellitus without complication (HCC)    diet control, pt denies- on medications   GERD (gastroesophageal reflux disease)    iwht certain foods/on meds/OTC PRN meds   Hyperlipidemia    on meds   Hypertension    on meds   TIA (transient ischemic attack)    pt unaware of this hx on 06/19/2017   Past Surgical History:  Procedure Laterality Date   ANTERIOR CERVICAL DECOMP/DISCECTOMY FUSION     BACK SURGERY     CARPAL TUNNEL RELEASE Left 2024   CESAREAN SECTION  1986   FOOT SURGERY Left 11/2019   IR  GENERIC HISTORICAL  09/21/2014   IR ANGIO INTRA EXTRACRAN SEL INTERNAL CAROTID UNI L MOD SED 09/21/2014 Lisbeth Renshaw, MD [facility]INTER[facility]   IR GENERIC HISTORICAL  09/21/2014   IR ANGIO INTRA EXTRACRAN SEL COM CAROTID INNOMINATE UNI R MOD SED 09/21/2014 Lisbeth Renshaw, MD [facility]   IR GENERIC HISTORICAL  09/21/2014   IR ANGIO VERTEBRAL SEL VERTEBRAL UNI L MOD SED 09/21/2014 Lisbeth Renshaw, MD [facility]   IR GENERIC HISTORICAL  09/21/2014   IR 3D INDEPENDENT WKST 09/21/2014 Lisbeth Renshaw, MD [facility]   LAPAROSCOPIC CHOLECYSTECTOMY     TUBAL LIGATION     TUBAL LIGATION      Social History:  reports that she has quit smoking. Her smoking use included cigarettes. She has never used smokeless tobacco. She reports that she does not currently use alcohol. She reports current drug use. Drug: Marijuana.   No Known Allergies  Family History  Problem Relation Age of Onset   Hypertension Mother    Arthritis Mother    Diabetes Mother    Dementia Mother    Heart attack Father    Heart disease Father    Hypertension Sister    Diabetes Sister    Hypertension Sister    Heart disease Sister    Diabetes Sister    Hypertension Brother    Heart disease Brother    Diabetes Brother    Hypertension Brother    Hypertension Brother  Hypertension Brother    Cancer Neg Hx    Colon cancer Neg Hx    Colon polyps Neg Hx    Esophageal cancer Neg Hx    Rectal cancer Neg Hx    Stomach cancer Neg Hx    Breast cancer Neg Hx       Prior to Admission medications   Medication Sig Start Date End Date Taking? Authorizing Provider  amLODipine (NORVASC) 10 MG tablet Take 1 tablet (10 mg total) by mouth daily. 05/02/23   [name], NP  dapagliflozin propanediol (FARXIGA) 5 MG TABS tablet Take 1 tablet (5 mg total) by mouth daily. 03/08/23   [name], NP  dicyclomine (BENTYL) 10 MG capsule Take 1 capsule (10 mg total) by mouth 3 (three) times daily before meals.  02/04/23   Meryl Dare, MD  DULoxetine (CYMBALTA) 30 MG capsule Take 1 capsule (30 mg total) by mouth daily. 05/02/23   [name], NP  loperamide (IMODIUM) 2 MG capsule Take 1 capsule (2 mg total) by mouth 4 (four) times daily as needed for diarrhea or loose stools. 12/06/22   Jeanelle Malling, PA  losartan-hydrochlorothiazide (HYZAAR) 100-25 MG tablet Take 1 tablet by mouth daily. 05/02/23   [name], NP  methocarbamol (ROBAXIN) 500 MG tablet Take 1 tablet (500 mg total) by mouth every 8 (eight) hours as needed for muscle spasms. 06/14/22   Debby Freiberg, NP  metoprolol succinate (TOPROL-XL) 25 MG 24 hr tablet TAKE ONE TABLET (25MG  TOTAL) BY MOUTH DAILY AT 9AM 01/16/23   Tolia, Sunit, DO  ondansetron (ZOFRAN) 4 MG tablet Take 1 tablet (4 mg total) by mouth every 8 (eight) hours as needed for nausea or vomiting. 02/04/23   Meryl Dare, MD  ondansetron (ZOFRAN) 8 MG tablet Take one prior to each prep dose. 02/04/23   Meryl Dare, MD  oxyCODONE-acetaminophen (PERCOCET/ROXICET) 5-325 MG tablet Take 1 tablet by mouth every 6 (six) hours as needed for moderate pain or severe pain. 10/17/22   [provider]  pantoprazole (PROTONIX) 40 MG tablet TAKE ONE TABLET (40mg  total) BY MOUTH DAILY AT 9AM Patient taking differently: Take 40 mg by mouth daily as needed (for heartburn). 06/20/22   Meryl Dare, MD  potassium chloride SA (KLOR-CON M) 20 MEQ tablet Take 2 tablets (40 mEq total) by mouth daily for 3 days. 12/05/22 05/02/23  Burnadette Pop, MD  pregabalin (LYRICA) 50 MG capsule Take 50 mg by mouth 3 (three) times daily. 04/30/22   [provider]  promethazine-dextromethorphan (PROMETHAZINE-DM) 6.25-15 MG/5ML syrup Take 2.5 mLs by mouth 4 (four) times daily as needed for cough. 04/22/23   Rising, Lurena Joiner, PA-C  rosuvastatin (CRESTOR) 40 MG tablet Take 1 tablet (40 mg total) by mouth daily. 05/02/23   [name], NP    Physical Exam: BP (!) 154/86    Pulse 72   Temp 98.9 F (37.2 C)   Resp 15   Wt 58.2 kg   SpO2 99%   BMI 24.24 kg/m   General: 67 y.o. year-old female well developed well nourished in no acute distress.  Alert and oriented x3. Cardiovascular: Regular rate and rhythm with no rubs or gallops.  No thyromegaly or JVD noted.  No lower extremity edema. 2/4 pulses in all 4 extremities.  Chest wall is tender centrally with palpation. Respiratory: Clear to auscultation with no wheezes or rales. Good inspiratory effort. Abdomen: Soft nontender nondistended with normal bowel sounds x4 quadrants. Muskuloskeletal: No  cyanosis, clubbing or edema noted bilaterally Neuro: CN II-XII intact, strength, sensation, reflexes Skin: No ulcerative lesions noted or rashes Psychiatry: Judgement and insight appear normal. Mood is appropriate for condition and setting          Labs on Admission:  Basic Metabolic Panel: Recent Labs  Lab 06/23/23 1957  NA 141  K 3.4*  CL 106  CO2 24  GLUCOSE 94  BUN 14  CREATININE 0.64  CALCIUM 9.6   Liver Function Tests: No results for input(s): "AST", "ALT", "ALKPHOS", "BILITOT", "PROT", "ALBUMIN" in the last 168 hours. No results for input(s): "LIPASE", "AMYLASE" in the last 168 hours. No results for input(s): "AMMONIA" in the last 168 hours. CBC: Recent Labs  Lab 06/23/23 1957  WBC 11.3*  HGB 11.8*  HCT 36.0  MCV 86.3  PLT 348   Cardiac Enzymes: No results for input(s): "CKTOTAL", "CKMB", "CKMBINDEX", "TROPONINI" in the last 168 hours.  BNP (last 3 results) No results for input(s): "BNP" in the last 8760 hours.  ProBNP (last 3 results) No results for input(s): "PROBNP" in the last 8760 hours.  CBG: Recent Labs  Lab 06/23/23 2012  GLUCAP 94    Radiological Exams on Admission: DG Chest 2 View Result Date: 06/23/2023 CLINICAL DATA:  Intermittent central chest pain for several days EXAM: CHEST - 2 VIEW COMPARISON:  12/02/2022 FINDINGS: Frontal and lateral views of the chest  demonstrate an unremarkable cardiac silhouette. No airspace disease, effusion, or pneumothorax. No acute bony abnormalities. IMPRESSION: 1. No acute intrathoracic process. Electronically Signed   By: Sharlet Salina M.D.   On: 06/23/2023 20:06    EKG: I independently viewed the EKG done and my findings are as followed: Sinus rhythm rate of 94.  Nonspecific ST-T changes.  QTc 364.  Assessment/Plan Present on Admission:  Hypertensive crisis  Principal Problem:   Hypertensive crisis  Hypertensive crisis in the setting of medication noncompliance Ran out of her BP medications 3 months ago, unable to afford them TOC consulted to assist with medications Resume home oral antihypertensives Closely monitor vital signs.  Atypical chest pain/elevated troponin, suspect demand ischemia in the setting of uncontrolled hypertension Troponin peaked at 94 and downtrended No evidence of acute ischemia on 12-lead EKG Follow 2D echo and monitor on telemetry.  Hyperlipidemia Resume home Crestor  Hypokalemia Serum potassium 3.4 Repleted orally. Repeat BMP and obtain magnesium level.  Type 2 diabetes, currently well-controlled Last hemoglobin A1c 7.5 on 12/03/2022 Sensitive insulin sliding scale   Time: 75 minutes.   DVT prophylaxis: Subcu Lovenox daily.  Code Status: Full code.  Family Communication: None at bedside.  Disposition Plan: Admitted to telemetry cardiac unit.  Consults called: Cardiology consulted by EDP, TOC.  Admission status: Inpatient status.   Status is: Inpatient The patient requires at least 2 midnights for further evaluation and treatment of present condition.   Darlin Drop MD [facility] Pager [phone]  If 7PM-7AM, please contact night-coverage [email or web address] Password [email or web address]  06/24/2023, 2:06 AM

## 2023-06-24 NOTE — ED Provider Notes (Cosign Needed Addendum)
[place] EMERGENCY DEPARTMENT AT [facility] Provider Note   CSN: [id number] Arrival date & time: 06/23/23  1909     History  Chief Complaint  Patient presents with   Chest Pain    [name] is a 66 y.o. female.  Patient to ED for evaluation of intermittent chest pain that started 3-4 days ago. No chest pain currently. She reports mild SOB when pain is present. Today she had nausea/vomiting for the first time since symptoms started. No history of heart issues. She states history of HTN, HLD, T2DM but has been out of her medications for several months. No recent fever, cough.  The history is provided by the patient. No language interpreter was used.  Chest Pain      Home Medications Prior to Admission medications   Medication Sig Start Date End Date Taking? Authorizing Provider  amLODipine (NORVASC) 10 MG tablet Take 1 tablet (10 mg total) by mouth daily. 05/02/23   [name], NP  dapagliflozin propanediol (FARXIGA) 5 MG TABS tablet Take 1 tablet (5 mg total) by mouth daily. 03/08/23   [name], NP  dicyclomine (BENTYL) 10 MG capsule Take 1 capsule (10 mg total) by mouth 3 (three) times daily before meals. 02/04/23   Meryl Dare, MD  DULoxetine (CYMBALTA) 30 MG capsule Take 1 capsule (30 mg total) by mouth daily. 05/02/23   [name], NP  loperamide (IMODIUM) 2 MG capsule Take 1 capsule (2 mg total) by mouth 4 (four) times daily as needed for diarrhea or loose stools. 12/06/22   Jeanelle Malling, PA  losartan-hydrochlorothiazide (HYZAAR) 100-25 MG tablet Take 1 tablet by mouth daily. 05/02/23   [name], NP  methocarbamol (ROBAXIN) 500 MG tablet Take 1 tablet (500 mg total) by mouth every 8 (eight) hours as needed for muscle spasms. 06/14/22   Debby Freiberg, NP  metoprolol succinate (TOPROL-XL) 25 MG 24 hr tablet TAKE ONE TABLET (25MG  TOTAL) BY MOUTH DAILY AT 9AM 01/16/23   Tolia, Sunit, DO  ondansetron (ZOFRAN) 4 MG tablet  Take 1 tablet (4 mg total) by mouth every 8 (eight) hours as needed for nausea or vomiting. 02/04/23   Meryl Dare, MD  ondansetron (ZOFRAN) 8 MG tablet Take one prior to each prep dose. 02/04/23   Meryl Dare, MD  oxyCODONE-acetaminophen (PERCOCET/ROXICET) 5-325 MG tablet Take 1 tablet by mouth every 6 (six) hours as needed for moderate pain or severe pain. 10/17/22   [provider]  pantoprazole (PROTONIX) 40 MG tablet TAKE ONE TABLET (40mg  total) BY MOUTH DAILY AT 9AM Patient taking differently: Take 40 mg by mouth daily as needed (for heartburn). 06/20/22   Meryl Dare, MD  potassium chloride SA (KLOR-CON M) 20 MEQ tablet Take 2 tablets (40 mEq total) by mouth daily for 3 days. 12/05/22 05/02/23  Burnadette Pop, MD  pregabalin (LYRICA) 50 MG capsule Take 50 mg by mouth 3 (three) times daily. 04/30/22   [provider]  promethazine-dextromethorphan (PROMETHAZINE-DM) 6.25-15 MG/5ML syrup Take 2.5 mLs by mouth 4 (four) times daily as needed for cough. 04/22/23   Rising, Lurena Joiner, PA-C  rosuvastatin (CRESTOR) 40 MG tablet Take 1 tablet (40 mg total) by mouth daily. 05/02/23   [name], NP      Allergies    Patient has no known allergies.    Review of Systems   Review of Systems  Cardiovascular:  Positive for chest pain.    Physical Exam Updated  Vital Signs BP (!) 154/86   Pulse 72   Temp 98.9 F (37.2 C)   Resp 15   Wt 58.2 kg   SpO2 99%   BMI 24.24 kg/m  Physical Exam Vitals and nursing note reviewed.  Constitutional:      General: She is not in acute distress.    Appearance: She is well-developed. She is not diaphoretic.  HENT:     Head: Normocephalic.  Neck:     Vascular: No carotid bruit.  Cardiovascular:     Rate and Rhythm: Normal rate and regular rhythm.     Heart sounds: No murmur heard. Pulmonary:     Effort: Pulmonary effort is normal.     Breath sounds: Normal breath sounds. No wheezing, rhonchi or rales.  Abdominal:      Palpations: Abdomen is soft.     Tenderness: There is no abdominal tenderness.  Musculoskeletal:        General: Normal range of motion.     Cervical back: Normal range of motion and neck supple.     Right lower leg: No edema.     Left lower leg: No edema.  Skin:    General: Skin is warm and dry.  Neurological:     General: No focal deficit present.     Mental Status: She is alert and oriented to person, place, and time.     ED Results / Procedures / Treatments   Labs (all labs ordered are listed, but only abnormal results are displayed) Labs Reviewed  BASIC METABOLIC PANEL - Abnormal; Notable for the following components:      Result Value   Potassium 3.4 (*)    All other components within normal limits  CBC - Abnormal; Notable for the following components:   WBC 11.3 (*)    Hemoglobin 11.8 (*)    All other components within normal limits  TROPONIN I (HIGH SENSITIVITY) - Abnormal; Notable for the following components:   Troponin I (High Sensitivity) 81 (*)    All other components within normal limits  TROPONIN I (HIGH SENSITIVITY) - Abnormal; Notable for the following components:   Troponin I (High Sensitivity) 94 (*)    All other components within normal limits  CBC  CREATININE, SERUM  CBG MONITORING, ED  TROPONIN I (HIGH SENSITIVITY)   Results for orders placed or performed during the hospital encounter of 06/23/23  Basic metabolic panel   Collection Time: 06/23/23  7:57 PM  Result Value Ref Range   Sodium 141 135 - 145 mmol/L   Potassium 3.4 (L) 3.5 - 5.1 mmol/L   Chloride 106 98 - 111 mmol/L   CO2 24 22 - 32 mmol/L   Glucose, Bld 94 70 - 99 mg/dL   BUN 14 8 - 23 mg/dL   Creatinine, Ser 4.09 0.44 - 1.00 mg/dL   Calcium 9.6 8.9 - 81.1 mg/dL   GFR, Estimated >91 >47 mL/min   Anion gap 11 5 - 15  CBC   Collection Time: 06/23/23  7:57 PM  Result Value Ref Range   WBC 11.3 (H) 4.0 - 10.5 K/uL   RBC 4.17 3.87 - 5.11 MIL/uL   Hemoglobin 11.8 (L) 12.0 - 15.0 g/dL    HCT 82.9 56.2 - 13.0 %   MCV 86.3 80.0 - 100.0 fL   MCH 28.3 26.0 - 34.0 pg   MCHC 32.8 30.0 - 36.0 g/dL   RDW 86.5 78.4 - 69.6 %   Platelets 348 150 - 400 K/uL  nRBC 0.0 0.0 - 0.2 %  Troponin I (High Sensitivity)   Collection Time: 06/23/23  7:57 PM  Result Value Ref Range   Troponin I (High Sensitivity) 81 (H) <18 ng/L  CBG monitoring, ED   Collection Time: 06/23/23  8:12 PM  Result Value Ref Range   Glucose-Capillary 94 70 - 99 mg/dL  Troponin I (High Sensitivity)   Collection Time: 06/23/23 11:13 PM  Result Value Ref Range   Troponin I (High Sensitivity) 94 (H) <18 ng/L     EKG EKG Interpretation Date/Time:  Sunday June 23 2023 19:20:38 EST Ventricular Rate:  81 PR Interval:  147 QRS Duration:  90 QT Interval:  383 QTC Calculation: 445 R Axis:   -66  Text Interpretation: Sinus rhythm Left anterior fascicular block Probable LVH with secondary repol abnrm Abnormal T, probable ischemia, lateral leads ST depression inferior and laterally, new when compared to prior Confirmed by Ross Marcus ([id number]) on 06/24/2023 12:48:20 AM  Radiology DG Chest 2 View Result Date: 06/23/2023 CLINICAL DATA:  Intermittent central chest pain for several days EXAM: CHEST - 2 VIEW COMPARISON:  12/02/2022 FINDINGS: Frontal and lateral views of the chest demonstrate an unremarkable cardiac silhouette. No airspace disease, effusion, or pneumothorax. No acute bony abnormalities. IMPRESSION: 1. No acute intrathoracic process. Electronically Signed   By: Sharlet Salina M.D.   On: 06/23/2023 20:06    Procedures .Critical Care  Performed by: Elpidio Anis, PA-C Authorized by: Elpidio Anis, PA-C   Critical care provider statement:    Critical care time (minutes):  30   Critical care was necessary to treat or prevent imminent or life-threatening deterioration of the following conditions: Hypertensive urgency, NSTEMI.   Critical care was time spent personally by me on the following  activities:  Discussions with consultants, examination of patient, obtaining history from patient or surrogate, ordering and performing treatments and interventions, ordering and review of laboratory studies, ordering and review of radiographic studies, pulse oximetry, re-evaluation of patient's condition and review of old charts     Medications Ordered in ED Medications  enoxaparin (LOVENOX) injection 40 mg (has no administration in time range)  amLODipine (NORVASC) tablet 10 mg (has no administration in time range)  rosuvastatin (CRESTOR) tablet 40 mg (has no administration in time range)  metoprolol succinate (TOPROL-XL) 24 hr tablet 12.5 mg (has no administration in time range)  acetaminophen (TYLENOL) tablet 650 mg (has no administration in time range)  morphine (PF) 2 MG/ML injection 2 mg (has no administration in time range)  melatonin tablet 5 mg (has no administration in time range)  polyethylene glycol (MIRALAX / GLYCOLAX) packet 17 g (has no administration in time range)  hydrALAZINE (APRESOLINE) injection 10 mg (10 mg Intravenous Given 06/24/23 0120)  nitroGLYCERIN (NITROGLYN) 2 % ointment 1 inch (1 inch Topical Given 06/24/23 0120)    ED Course/ Medical Decision Making/ A&P                                 Medical Decision Making This patient presents to the ED for concern of chest pain, this involves an extensive number of treatment options, and is a complaint that carries with it a high risk of complications and morbidity.  The differential diagnosis includes ACS, dissection, PE, PTX, PNA   Co morbidities that complicate the patient evaluation  HTN, T2DM, HLD, medication noncompliance   Additional history obtained:  Additional history and/or information obtained from chart review,  notable for Review of chart - cardiology notes for office visits for resistant hypertension, many no show appointments.   Lab Tests:  I Ordered, and personally interpreted labs.  The  pertinent results include:  Troponin 81 --> 94; WBC 11.3; potassium 3.4, otherwise no electrolyte abnormalities.    Imaging Studies ordered:  I ordered imaging studies including CXR I independently visualized and interpreted imaging which showed NAD I agree with the radiologist interpretation   Cardiac Monitoring:  The patient was maintained on a cardiac monitor.  I personally viewed and interpreted the cardiac monitored which showed an underlying rhythm of: new ST depressions in inferior and lateral leads    Critical Interventions:  1 inch NTG paste and IV hydralazine for significant hypertension    Consultations Obtained:  I requested consultation with the cardiology, Dr. Selinda Flavin,  and discussed lab and imaging findings as well as pertinent plan - they recommend: feels likely hypertensive urgency causing chest pain. Will admit to hospitalist, transfer to [facility], cardiology to provide consultation.    Problem List / ED Course:  Intermittent chest pain x 3-4 days - no pain currently HTN with noncompliance with medications EKG changes, elevated troponins - discussed with cardiology - feels related to hypertensive urgency over NSTEMI    Social Determinants of Health:  Poor insight into self care Chronic noncompliance   Disposition:  After consideration of the diagnostic results and the patients response to treatment, I feel that the patient would benefit from admit - transfer to [facility]. Discussed with Dr. Margo Aye, hospitalist, who accepts for admission.   Amount and/or Complexity of Data Reviewed Labs: ordered. Radiology: ordered.  Risk Prescription drug management. Decision regarding hospitalization.           Final Clinical Impression(s) / ED Diagnoses Final diagnoses:  Hypertensive urgency    Rx / DC Orders ED Discharge Orders     None         Elpidio Anis, PA-C 06/24/23 0211    Elpidio Anis, PA-C 06/24/23 0214

## 2023-06-24 NOTE — Care Plan (Addendum)
This 67 years old female with PMH significant for type 2 diabetes, diabetic polyneuropathy, hypertension, hyperlipidemia presented in the ED with complaints of nonexertional chest pain.  She describes chest pain as reproducible with palpation centrally located and intermittent for last 3 to 4 days.  She ran out of her blood pressure medications for last 3 months due to the death in the family or not able to afford it.  She was severely hypertensive on arrival in the ED with SBP 201/125.  She is also found to have slightly elevated troponins.  EDP has discussed with cardiology, who has recommended admission for uncontrolled hypertension.  Patient resumed on home blood pressure medications,  blood pressure is slowly improving.  Cardiology recommended ischemic evaluation once blood pressure is better controlled.  Obtain 2D echocardiogram.  Patient was seen and examined at bedside.

## 2023-06-24 NOTE — Care Management Obs Status (Signed)
MEDICARE OBSERVATION STATUS NOTIFICATION   Patient Details  Name: LEATHER CICCOTELLI MRN: [id number] Date of Birth: 1955/07/18   Medicare Observation Status Notification Given:  Yes    Geni Bers, RN 06/24/2023, 2:41 PM

## 2023-06-25 DIAGNOSIS — R7989 Other specified abnormal findings of blood chemistry: Secondary | ICD-10-CM | POA: Diagnosis not present

## 2023-06-25 DIAGNOSIS — I169 Hypertensive crisis, unspecified: Secondary | ICD-10-CM | POA: Diagnosis not present

## 2023-06-25 DIAGNOSIS — I5020 Unspecified systolic (congestive) heart failure: Secondary | ICD-10-CM | POA: Diagnosis not present

## 2023-06-25 LAB — GLUCOSE, CAPILLARY
Glucose-Capillary: 133 mg/dL — ABNORMAL HIGH (ref 70–99)
Glucose-Capillary: 73 mg/dL (ref 70–99)
Glucose-Capillary: 107 mg/dL — ABNORMAL HIGH (ref 70–99)
Glucose-Capillary: 114 mg/dL — ABNORMAL HIGH (ref 70–99)

## 2023-06-25 MED ORDER — SPIRONOLACTONE 25 MG PO TABS
25.0000 mg | ORAL_TABLET | Freq: Every day | ORAL | Status: DC
  Administered 2023-06-25 – 2023-07-02 (×8): 25 mg via ORAL
  Filled 2023-06-25 (×9): qty 1

## 2023-06-25 MED ORDER — POTASSIUM CHLORIDE 20 MEQ PO PACK
40.0000 meq | PACK | Freq: Once | ORAL | Status: AC
  Administered 2023-06-25: 40 meq via ORAL
  Filled 2023-06-25: qty 2

## 2023-06-25 NOTE — Progress Notes (Addendum)
 Patient Name: [name] Date of Encounter: 06/25/2023 [facility] Cardiologist: Aleene Passe, MD   Interval Summary  .    No further chest pain.  Had a headache off the nitroglycerin .  Blood pressures are improving.  No shortness of breath.  Vital Signs .    Vitals:   06/24/23 1803 06/24/23 2101 06/25/23 0118 06/25/23 0426  BP: (!) 160/91 (!) 146/85 (!) 164/97 (!) 167/113  Pulse: 99 72 80 68  Resp:    15  Temp:  98.4 F (36.9 C) 98.4 F (36.9 C) 98.4 F (36.9 C)  TempSrc:  Oral Oral Oral  SpO2: 97% 96% 95% 96%  Weight:      Height:        Intake/Output Summary (Last 24 hours) at 06/25/2023 0935 Last data filed at 06/25/2023 0600 Gross per 24 hour  Intake 480 ml  Output --  Net 480 ml      06/24/2023    1:07 PM 06/24/2023    1:26 AM 05/02/2023   11:16 AM  Last 3 Weights  Weight (lbs) 128 lb 4.8 oz 128 lb 4.8 oz 106 lb  Weight (kg) 58.196 kg 58.196 kg 48.081 kg      Telemetry/ECG    Sinus rhythm heart rates in the 80s- Personally Reviewed  CV Studies    Echocardiogram 06/24/2023  1. Left ventricular ejection fraction, by estimation, is 40 to 45%. The  left ventricle has mildly decreased function. The left ventricle  demonstrates regional wall motion abnormalities (see scoring  diagram/findings for description). There is mild  concentric left ventricular hypertrophy. Left ventricular diastolic  parameters are consistent with Grade I diastolic dysfunction (impaired  relaxation). Elevated left ventricular end-diastolic pressure.   2. Right ventricular systolic function is normal. The right ventricular  size is normal.   3. Left atrial size was severely dilated.   4. The mitral valve is normal in structure. Trivial mitral valve  regurgitation. No evidence of mitral stenosis.   5. The aortic valve is normal in structure. Aortic valve regurgitation is  not visualized. No aortic stenosis is present.   6. The inferior vena cava is normal in  size with greater than 50%  respiratory variability, suggesting right atrial pressure of 3 mmHg.     Physical Exam .   GEN: No acute distress.   Neck: No JVD Cardiac: RRR, no murmurs, rubs, or gallops.  Respiratory: Clear to auscultation bilaterally. GI: Soft, nontender, non-distended  MS: No edema  Patient Profile    [name] is a 67 y.o. female has hx of hypertension, diabetes, GERD, hyperlipidemia, TIA, small brain aneurysm and admitted on 06/24/2019 for for the evaluation of hypertensive urgency and chest pain.  Assessment & Plan .     Chest pain Nonexertional, reproducible with palpation and positional occurring at night generally when she is laying flat.  Denies that it feels like GERD though.  Has a strong family history of a father who died of a MI, and also has risk factors for CAD.  EKG with lateral T wave inversions, troponin peaked at 97 and trended down.  Overall has a mix of atypical and typical features however given her mildly reduced EF with akinesis of her inferior lateral wall, ischemic etiology cannot be ruled out but also may be attributed to uncontrolled BP.  Will discuss with MD timing of ischemic evaluation, deferring until better control BP.  She would be amenable to cardiac catheterization.. Check LP(a) Informed Consent  Shared Decision Making/Informed Consent The risks [stroke (1 in 1000), death (1 in 1000), kidney failure [usually temporary] (1 in 500), bleeding (1 in 200), allergic reaction [possibly serious] (1 in 200)], benefits (diagnostic support and management of coronary artery disease) and alternatives of a cardiac catheterization were discussed in detail with [name] and she is willing to proceed.     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  Echo here shows EF 40 to 45% with mild concentric LVH and normal RV function.  Severely dilated LA. Currently on carvedilol  6.25 mg, irbesartan  300 mg (may  end up transitioning this to Entresto ), start spironolactone  25 mg.  Consider SGLT2 inhibitor at discharge.  Reports no UTIs or yeast infections. Discontinue HCTZ to allow further titration of GDMT.   Hypertensive urgency Reports being out of her blood pressure medications for 3 months secondary to being out of work due to injury as well as significant life stressors however reports she is able to afford her medications now. Continue the above along with amlodipine  10 mg okay to continue this for now however if it interferes with titration of GDMT discontinue. May have some aldosteronism, persistently has low potassium.  Will start spironolactone  as above.  Hypokalemia 3.0 today.  Will give 1 dose of 40 mEq  History of GERD Reports her chest pain feeling differently.  May consider adding PPI, it is worse when lying flat.  Hyperlipidemia LDL 178.  Needs better control, currently on rosuvastatin  40 mg.  For questions or updates, please contact [facility] Please consult [email or web address] for contact info under        Signed, Thom LITTIE Sluder, PA-C    Attending Note:   The patient was seen and examined.  Agree with assessment and plan as noted above.  Changes made to the above note as needed.  Patient seen and independently examined with Thom Sluder, PA .   We discussed all aspects of the encounter. I agree with the assessment and plan as stated above.   Hypertensive urgency:   BP is improving , on oral meds.  Spironolactone  was started today   2.  HFrEF:   EF is ~40%.  Inf. And lateral wall motion defects .  Spironolactone  added today . Consider Entresto  but at this point , she is having difficulty affording / getting generic meds.   She may do better with an ARB for now   3.  Chest pain :  associated with wall motion abn and + troponins  Will schedule her for cardiac cath on Thursday .  The procedure including risks,benefits , options were discussed with the patient.  She  understands and agrees to proceed.     I have spent a total of 40 minutes with patient reviewing hospital  notes , telemetry, EKGs, labs and examining patient as well as establishing an assessment and plan that was discussed with the patient.  > 50% of time was spent in direct patient care.    Aleene DOROTHA Passe, Mickey., MD, FACC 06/25/2023, 1:21 PM [address],  [address] Office [phone] Pager [phone]

## 2023-06-25 NOTE — TOC Progression Note (Signed)
 Transition of Care [facility]) - Progression Note    Patient Details  Name: [name] MRN: [id number] Date of Birth: September 22, 1955  Transition of Care [facility]) CM/SW Contact  Toy LITTIE Agar, RN Phone Number:[phone]  06/25/2023, 11:07 AM  Clinical Narrative:    TOC acknowledges consult for medication assistance. CM at bedside to discuss medication needs with patient. Patient states that she was out of work and didn't have the money. Patient states that she just got her social security check and she will be able to pick up medications at [facility] at discharge. No other TOC needs noted at this time. TOC will sign off.         Expected Discharge Plan and Services                                               Social Determinants of Health (SDOH) Interventions SDOH Screenings   Food Insecurity: No Food Insecurity (06/24/2023)  Housing: Low Risk  (06/24/2023)  Transportation Needs: No Transportation Needs (06/24/2023)  Utilities: At Risk (06/24/2023)  Alcohol Screen: Low Risk  (05/02/2023)  Depression (PHQ2-9): Low Risk  (05/02/2023)  Financial Resource Strain: Low Risk  (05/02/2023)  Physical Activity: Inactive (05/02/2023)  Social Connections: Moderately Integrated (06/24/2023)  Recent Concern: Social Connections - Moderately Isolated (05/02/2023)  Stress: Stress Concern Present (05/02/2023)  Tobacco Use: Medium Risk (06/24/2023)  Health Literacy: Adequate Health Literacy (05/02/2023)    Readmission Risk Interventions     No data to display

## 2023-06-25 NOTE — Progress Notes (Signed)
 PROGRESS NOTE    [name]  FMW:[id number] DOB: 10-26-1955 DOA: 06/23/2023 PCP: Celestia Rosaline SQUIBB, NP   Brief Narrative: This 67 years old female with PMH significant for type 2 diabetes, diabetic polyneuropathy, hypertension, hyperlipidemia presented in the ED with complaints of nonexertional chest pain.  She describes chest pain as reproducible with palpation centrally located and intermittent for last 3 to 4 days.  She ran out of her blood pressure medications for last 3 months due to the death in the family or not able to afford it.  She was severely hypertensive on arrival in the ED with SBP 201/125.  She is also found to have slightly elevated troponins.  EDP has discussed with cardiology, who has recommended admission for uncontrolled hypertension.  Patient resumed on home blood pressure medications,  blood pressure is slowly improving.  Cardiology recommended ischemic evaluation once blood pressure is better controlled.  Scheduled for left heart cath on Thursday.  Assessment & Plan:   Principal Problem:   Hypertensive crisis Active Problems:   Hypertensive urgency   HFrEF (heart failure with reduced ejection fraction) (HCC)   Elevated troponin  Hypertensive urgency in the setting of medication noncompliance. Patient ran out of her blood pressure medications 3 months ago due to cost issue. She is unable to afford them. TOC consulted to assist with medications Continue amlodipine  10 mg daily Continue Coreg  6.125 mg p.o. twice daily Continue Avapro  300 mg daily Continue hydralazine  10 mg IVP every 6 hours as needed Started on spironolactone  25 mg daily for better blood pressure control. Blood pressure is improving. Closely monitor vital signs.  Atypical chest pain:  Elevated troponin suspect demand ischemia in the setting of hypertensive urgency Troponin peaked at 94 and down trended 77 No evidence of acute ischemia on 12-lead EKG Follow 2D echo and monitor on  telemetry. Cardiology tentatively scheduled  left heart cath on Thursday.   Hyperlipidemia: Continue Crestor .   Hypokalemia: Replaced.  Continue to monitor   Type 2 diabetes, currently well-controlled: Last hemoglobin A1c 7.5 on 12/03/2022 Sensitive insulin  sliding scale.  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 Echo shows LVEF 40 to 45% with LVH and normal RV function. Continue Coreg  or irbesartan  and started on spironolactone .   DVT prophylaxis:  Lovenox  Code Status: Full code Family Communication: No family at bed side Disposition Plan:     Status is: Observation The patient remains OBS appropriate and will d/c before 2 midnights.   Consultants:  Cardiology  Procedures: Scheduled LHC on Thursday.  Antimicrobials: None  Subjective: Patient was seen and examined at bedside.  Overnight events noted.   Patient reports doing much better.  She denies any chest pain.   Patient states cardiologist has advised to have left heart cath,  scheduled tentatively on Thursday.  Objective: Vitals:   06/24/23 2101 06/25/23 0118 06/25/23 0426 06/25/23 1402  BP: (!) 146/85 (!) 164/97 (!) 167/113 135/85  Pulse: 72 80 68 74  Resp:   15 16  Temp: 98.4 F (36.9 C) 98.4 F (36.9 C) 98.4 F (36.9 C) 98.1 F (36.7 C)  TempSrc: Oral Oral Oral Oral  SpO2: 96% 95% 96% 98%  Weight:      Height:        Intake/Output Summary (Last 24 hours) at 06/25/2023 1435 Last data filed at 06/25/2023 0830 Gross per 24 hour  Intake 840 ml  Output --  Net 840 ml   Filed Weights   06/24/23  0126 06/24/23 1307  Weight: 58.2 kg 58.2 kg    Examination:  General exam: Appears calm and comfortable, not in any acute distress. Respiratory system: Clear to auscultation. Respiratory effort normal. RR 15 Cardiovascular system: S1 & S2 heard, RRR. No JVD, murmurs, rubs, gallops or clicks.  Gastrointestinal system: Abdomen is non distended,  soft and non tender.  Normal bowel sounds heard. Central nervous system: Alert and oriented X 3. No focal neurological deficits. Extremities: No edema, no cyanosis, no clubbing Skin: No rashes, lesions or ulcers Psychiatry: Judgement and insight appear normal. Mood & affect appropriate.     Data Reviewed: I have personally reviewed following labs and imaging studies  CBC: Recent Labs  Lab 06/23/23 1957 06/24/23 0240 06/24/23 0634  WBC 11.3* 10.8* 10.3  HGB 11.8* 11.6* 11.1*  HCT 36.0 34.4* 33.4*  MCV 86.3 85.8 86.3  PLT 348 332 309   Basic Metabolic Panel: Recent Labs  Lab 06/23/23 1957 06/24/23 0240 06/24/23 0634  NA 141  --  137  K 3.4*  --  3.0*  CL 106  --  106  CO2 24  --  23  GLUCOSE 94  --  121*  BUN 14  --  11  CREATININE 0.64 0.61 0.52  CALCIUM  9.6  --  8.9  MG  --   --  2.1  PHOS  --   --  3.6   GFR: Estimated Creatinine Clearance: 55.3 mL/min (by C-G formula based on SCr of 0.52 mg/dL). Liver Function Tests: No results for input(s): AST, ALT, ALKPHOS, BILITOT, PROT, ALBUMIN  in the last 168 hours. No results for input(s): LIPASE, AMYLASE in the last 168 hours. No results for input(s): AMMONIA in the last 168 hours. Coagulation Profile: No results for input(s): INR, PROTIME in the last 168 hours. Cardiac Enzymes: No results for input(s): CKTOTAL, CKMB, CKMBINDEX, TROPONINI in the last 168 hours. BNP (last 3 results) No results for input(s): PROBNP in the last 8760 hours. HbA1C: No results for input(s): HGBA1C in the last 72 hours. CBG: Recent Labs  Lab 06/24/23 1218 06/24/23 1549 06/24/23 2128 06/25/23 0741 06/25/23 1130  GLUCAP 101* 110* 144* 133* 73   Lipid Profile: No results for input(s): CHOL, HDL, LDLCALC, TRIG, CHOLHDL, LDLDIRECT in the last 72 hours. Thyroid  Function Tests: No results for input(s): TSH, T4TOTAL, FREET4, T3FREE, THYROIDAB in the last 72 hours. Anemia Panel: No  results for input(s): VITAMINB12, FOLATE, FERRITIN, TIBC, IRON, RETICCTPCT in the last 72 hours. Sepsis Labs: No results for input(s): PROCALCITON, LATICACIDVEN in the last 168 hours.  No results found for this or any previous visit (from the past 240 hours).   Radiology Studies: ECHOCARDIOGRAM COMPLETE Result Date: 06/24/2023    ECHOCARDIOGRAM REPORT   Patient Name:   LUZMARIA DEVAUX Date of Exam: 06/24/2023 Medical Rec #:  [id number]         Height:       61.0 in Accession #:    [id number]        Weight:       128.3 lb Date of Birth:  09-Sep-1955         BSA:          1.564 m Patient Age:    67 years          BP:           135/81 mmHg Patient Gender: F                 HR:  80 bpm. Exam Location:  Inpatient Procedure: 2D Echo, Cardiac Doppler and Color Doppler Indications:    Elevated Troponin  History:        Patient has prior history of Echocardiogram examinations, most                 recent 05/17/2018. Risk Factors:Hypertension, Diabetes and                 Former Smoker.  Sonographer:    Ozell Free Referring Phys: [id number] CAROLE N HALL IMPRESSIONS  1. Left ventricular ejection fraction, by estimation, is 40 to 45%. The left ventricle has mildly decreased function. The left ventricle demonstrates regional wall motion abnormalities (see scoring diagram/findings for description). There is mild concentric left ventricular hypertrophy. Left ventricular diastolic parameters are consistent with Grade I diastolic dysfunction (impaired relaxation). Elevated left ventricular end-diastolic pressure.  2. Right ventricular systolic function is normal. The right ventricular size is normal.  3. Left atrial size was severely dilated.  4. The mitral valve is normal in structure. Trivial mitral valve regurgitation. No evidence of mitral stenosis.  5. The aortic valve is normal in structure. Aortic valve regurgitation is not visualized. No aortic stenosis is present.  6. The inferior vena cava  is normal in size with greater than 50% respiratory variability, suggesting right atrial pressure of 3 mmHg. FINDINGS  Left Ventricle: Left ventricular ejection fraction, by estimation, is 40 to 45%. The left ventricle has mildly decreased function. The left ventricle demonstrates regional wall motion abnormalities. The left ventricular internal cavity size was normal in size. There is mild concentric left ventricular hypertrophy. Left ventricular diastolic parameters are consistent with Grade I diastolic dysfunction (impaired relaxation). Elevated left ventricular end-diastolic pressure.  LV Wall Scoring: The entire anterior wall and entire lateral wall are hypokinetic. The entire septum, entire inferior wall, and apex are normal. Right Ventricle: The right ventricular size is normal. No increase in right ventricular wall thickness. Right ventricular systolic function is normal. Left Atrium: Left atrial size was severely dilated. Right Atrium: Right atrial size was normal in size. Pericardium: There is no evidence of pericardial effusion. Mitral Valve: The mitral valve is normal in structure. Trivial mitral valve regurgitation. No evidence of mitral valve stenosis. Tricuspid Valve: The tricuspid valve is normal in structure. Tricuspid valve regurgitation is not demonstrated. No evidence of tricuspid stenosis. Aortic Valve: The aortic valve is normal in structure. Aortic valve regurgitation is not visualized. No aortic stenosis is present. Aortic valve mean gradient measures 4.0 mmHg. Aortic valve peak gradient measures 7.6 mmHg. Aortic valve area, by VTI measures 1.78 cm. Pulmonic Valve: The pulmonic valve was normal in structure. Pulmonic valve regurgitation is not visualized. No evidence of pulmonic stenosis. Aorta: The aortic root is normal in size and structure. Venous: The inferior vena cava is normal in size with greater than 50% respiratory variability, suggesting right atrial pressure of 3 mmHg.  IAS/Shunts: No atrial level shunt detected by color flow Doppler.  LEFT VENTRICLE PLAX 2D LVIDd:         5.20 cm     Diastology LVIDs:         4.10 cm     LV e' medial:    5.00 cm/s LV PW:         1.10 cm     LV E/e' medial:  16.8 LV IVS:        1.10 cm     LV e' lateral:   5.11 cm/s LVOT diam:  2.00 cm     LV E/e' lateral: 16.4 LV SV:         46 LV SV Index:   30 LVOT Area:     3.14 cm  LV Volumes (MOD) LV vol d, MOD A2C: 98.6 ml LV vol d, MOD A4C: 92.7 ml LV vol s, MOD A2C: 61.5 ml LV vol s, MOD A4C: 55.7 ml LV SV MOD A2C:     37.1 ml LV SV MOD A4C:     92.7 ml LV SV MOD BP:      37.6 ml RIGHT VENTRICLE RV Basal diam:  3.50 cm RV S prime:     12.50 cm/s TAPSE (M-mode): 2.0 cm LEFT ATRIUM             Index        RIGHT ATRIUM           Index LA diam:        3.70 cm 2.37 cm/m   RA Area:     11.60 cm LA Vol (A2C):   95.5 ml 61.06 ml/m  RA Volume:   28.90 ml  18.48 ml/m LA Vol (A4C):   39.8 ml 25.45 ml/m LA Biplane Vol: 62.5 ml 39.96 ml/m  AORTIC VALVE AV Area (Vmax):    1.68 cm AV Area (Vmean):   1.88 cm AV Area (VTI):     1.78 cm AV Vmax:           138.00 cm/s AV Vmean:          91.700 cm/s AV VTI:            0.261 m AV Peak Grad:      7.6 mmHg AV Mean Grad:      4.0 mmHg LVOT Vmax:         73.80 cm/s LVOT Vmean:        55.000 cm/s LVOT VTI:          0.148 m LVOT/AV VTI ratio: 0.57  AORTA Ao Root diam: 2.80 cm Ao Asc diam:  3.00 cm MITRAL VALVE MV Area (PHT): 4.49 cm     SHUNTS MV Decel Time: 169 msec     Systemic VTI:  0.15 m MR Peak grad: 139.2 mmHg    Systemic Diam: 2.00 cm MR Vmax:      590.00 cm/s MV E velocity: 84.00 cm/s MV A velocity: 111.00 cm/s MV E/A ratio:  0.76 Annabella Scarce MD Electronically signed by Annabella Scarce MD Signature Date/Time: 06/24/2023/5:23:36 PM    Final    DG Chest 2 View Result Date: 06/23/2023 CLINICAL DATA:  Intermittent central chest pain for several days EXAM: CHEST - 2 VIEW COMPARISON:  12/02/2022 FINDINGS: Frontal and lateral views of the chest demonstrate  an unremarkable cardiac silhouette. No airspace disease, effusion, or pneumothorax. No acute bony abnormalities. IMPRESSION: 1. No acute intrathoracic process. Electronically Signed   By: Ozell Daring M.D.   On: 06/23/2023 20:06   Scheduled Meds:  amLODipine   10 mg Oral Daily   carvedilol   6.25 mg Oral BID WC   DULoxetine   30 mg Oral Daily   enoxaparin  (LOVENOX ) injection  40 mg Subcutaneous Q24H   insulin  aspart  0-9 Units Subcutaneous TID WC   irbesartan   300 mg Oral Daily   rosuvastatin   40 mg Oral Daily   spironolactone   25 mg Oral Daily   Continuous Infusions:   LOS: 1 day    Time spent: 50 mins    Darcel Dawley, MD [facility]   If 7PM-7AM, please contact night-coverage

## 2023-06-25 NOTE — Plan of Care (Signed)
   Problem: Education: Goal: Ability to describe self-care measures that may prevent or decrease complications (Diabetes Survival Skills Education) will improve Outcome: Progressing Goal: Individualized Educational Video(s) Outcome: Progressing

## 2023-06-26 DIAGNOSIS — R7989 Other specified abnormal findings of blood chemistry: Secondary | ICD-10-CM | POA: Diagnosis not present

## 2023-06-26 DIAGNOSIS — I169 Hypertensive crisis, unspecified: Secondary | ICD-10-CM | POA: Diagnosis not present

## 2023-06-26 DIAGNOSIS — I509 Heart failure, unspecified: Secondary | ICD-10-CM

## 2023-06-26 HISTORY — DX: Heart failure, unspecified: I50.9

## 2023-06-26 LAB — SURGICAL PCR SCREEN
MRSA, PCR: NEGATIVE
Staphylococcus aureus: NEGATIVE

## 2023-06-26 LAB — BASIC METABOLIC PANEL
Anion gap: 8 (ref 5–15)
BUN: 18 mg/dL (ref 8–23)
CO2: 21 mmol/L — ABNORMAL LOW (ref 22–32)
Calcium: 9.3 mg/dL (ref 8.9–10.3)
Chloride: 107 mmol/L (ref 98–111)
Creatinine, Ser: 0.55 mg/dL (ref 0.44–1.00)
GFR, Estimated: >60 mL/min (ref >60)
Glucose, Bld: 112 mg/dL — ABNORMAL HIGH (ref 70–99)
Potassium: 3.5 mmol/L (ref 3.5–5.1)
Sodium: 136 mmol/L (ref 135–145)

## 2023-06-26 LAB — GLUCOSE, CAPILLARY
Glucose-Capillary: 119 mg/dL — ABNORMAL HIGH (ref 70–99)
Glucose-Capillary: 113 mg/dL — ABNORMAL HIGH (ref 70–99)
Glucose-Capillary: 91 mg/dL (ref 70–99)
Glucose-Capillary: 137 mg/dL — ABNORMAL HIGH (ref 70–99)

## 2023-06-26 MED ORDER — ASPIRIN 81 MG PO CHEW
81.0000 mg | CHEWABLE_TABLET | ORAL | Status: AC
Start: 2023-06-27 — End: 2023-06-28
  Administered 2023-06-27: 81 mg via ORAL
  Filled 2023-06-26: qty 1

## 2023-06-26 MED ORDER — MUPIROCIN 2 % EX OINT
1.0000 | TOPICAL_OINTMENT | Freq: Two times a day (BID) | CUTANEOUS | Status: DC
Start: 2023-06-26 — End: 2023-07-01
  Administered 2023-06-26 – 2023-06-27 (×2): 1 via NASAL
  Filled 2023-06-26 (×2): qty 22

## 2023-06-26 MED ORDER — SODIUM CHLORIDE 0.9 % IV SOLN: INTRAVENOUS | Status: DC

## 2023-06-26 NOTE — H&P (View-Only) (Signed)
 Patient Name: [name] Date of Encounter: 06/26/2023 [facility] Cardiologist: Aleene Passe, MD   Interval Summary  .    Feeling better today  On for cath tomorrow    Vital Signs .    Vitals:   06/25/23 1402 06/25/23 2030 06/26/23 0400 06/26/23 0808  BP: 135/85 (!) 157/95 (!) 155/101 (!) 150/105  Pulse: 74 75  83  Resp: 16 18 18    Temp: 98.1 F (36.7 C) 98.7 F (37.1 C) 98.2 F (36.8 C)   TempSrc: Oral Oral Oral   SpO2: 98% 100% 100%   Weight:      Height:        Intake/Output Summary (Last 24 hours) at 06/26/2023 0823 Last data filed at 06/26/2023 0600 Gross per 24 hour  Intake 1200 ml  Output --  Net 1200 ml      06/24/2023    1:07 PM 06/24/2023    1:26 AM 05/02/2023   11:16 AM  Last 3 Weights  Weight (lbs) 128 lb 4.8 oz 128 lb 4.8 oz 106 lb  Weight (kg) 58.196 kg 58.196 kg 48.081 kg      Telemetry/ECG    Sinus rhythm heart rates in the 80s- Personally Reviewed  CV Studies    Echocardiogram 06/24/2023  1. Left ventricular ejection fraction, by estimation, is 40 to 45%. The  left ventricle has mildly decreased function. The left ventricle  demonstrates regional wall motion abnormalities (see scoring  diagram/findings for description). There is mild  concentric left ventricular hypertrophy. Left ventricular diastolic  parameters are consistent with Grade I diastolic dysfunction (impaired  relaxation). Elevated left ventricular end-diastolic pressure.   2. Right ventricular systolic function is normal. The right ventricular  size is normal.   3. Left atrial size was severely dilated.   4. The mitral valve is normal in structure. Trivial mitral valve  regurgitation. No evidence of mitral stenosis.   5. The aortic valve is normal in structure. Aortic valve regurgitation is  not visualized. No aortic stenosis is present.   6. The inferior vena cava is normal in size with greater than 50%  respiratory variability, suggesting right atrial  pressure of 3 mmHg.     Physical Exam .   Physical Exam: Blood pressure (!) 150/105, pulse 83, temperature 98.2 F (36.8 C), temperature source Oral, resp. rate 18, height 5' 1 (1.549 m), weight 58.2 kg, SpO2 100%.      GEN:  chronically ill appearing female  HEENT: Normal NECK: No JVD; No carotid bruits LYMPHATICS: No lymphadenopathy CARDIAC: RRR , no murmurs, rubs, gallops RESPIRATORY:  Clear to auscultation without rales, wheezing or rhonchi  ABDOMEN: Soft, non-tender, non-distended MUSCULOSKELETAL:  No edema; No deformity  SKIN: Warm and dry NEUROLOGIC:  Alert and oriented x 3  Patient Profile    [name] is a 68 y.o. female has hx of hypertension, diabetes, GERD, hyperlipidemia, TIA, small brain aneurysm and admitted on 06/24/2019 for for the evaluation of hypertensive urgency and chest pain.  Assessment & Plan .     Chest pain Nonexertional, reproducible with palpation and positional occurring at night generally when she is laying flat.  Denies that it feels like GERD though.  Has a strong family history of a father who died of a MI, and also has risk factors for CAD.  EKG with lateral T wave inversions, troponin peaked at 97 and trended down.   She is on the schedule for cath tomorrow   Check LP(a) Informed  Consent   Shared Decision Making/Informed Consent The risks [stroke (1 in 1000), death (1 in 1000), kidney failure [usually temporary] (1 in 500), bleeding (1 in 200), allergic reaction [possibly serious] (1 in 200)], benefits (diagnostic support and management of coronary artery disease) and alternatives of a cardiac catheterization were discussed in detail with Ms. Lainez and she is willing to proceed.     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  Echo here shows EF 40 to 45% with mild concentric LVH and normal RV function.  Severely dilated LA. Currently on carvedilol  6.25 mg, irbesartan  300 mg  Discontinue HCTZ  to allow further titration of GDMT. Spironolactone  was started yesterday   Hypertensive urgency  She was out of her meds for several months .  BP is slowly improving    Hypokalemia Better. Continue spironolactone      History of GERD    Hyperlipidemia LDL 178.  Needs better control, currently on rosuvastatin  40 mg.  For questions or updates, please contact [facility] Please consult [email or web address] for contact info under

## 2023-06-26 NOTE — Plan of Care (Signed)

## 2023-06-26 NOTE — Progress Notes (Signed)
 PROGRESS NOTE    [name]  FMW:[id number] DOB: [birth year] DOA: 06/23/2023 PCP: Celestia Rosaline SQUIBB, NP   Brief Narrative: This 67 years old female with PMH significant for type 2 diabetes, diabetic polyneuropathy, hypertension, hyperlipidemia presented in the ED with complaints of nonexertional chest pain.  She describes chest pain as reproducible with palpation centrally located and intermittent for last 3 to 4 days.  She ran out of her blood pressure medications for last 3 months due to the death in the family or not able to afford it.  She was severely hypertensive on arrival in the ED with SBP 201/125.  She is also found to have slightly elevated troponins.  EDP has discussed with cardiology, who has recommended admission for uncontrolled hypertension.  Patient resumed on home blood pressure medications,  blood pressure is slowly improving.  Cardiology recommended ischemic evaluation once blood pressure is better controlled.  Scheduled for left heart cath on Thursday.  Assessment & Plan:   Principal Problem:   Hypertensive crisis Active Problems:   Hypertensive urgency   HFrEF (heart failure with reduced ejection fraction) (HCC)   Elevated troponin  Hypertensive urgency in the setting of medication noncompliance: Patient ran out of her blood pressure medications 3 months ago due to cost issue. She is unable to afford them. TOC consulted to assist with medications. Continue amlodipine  10 mg daily Continue Coreg  6.125 mg p.o. twice daily Continue Avapro  300 mg daily Continue hydralazine  10 mg IVP every 6 hours as needed Started on spironolactone  25 mg daily for better blood pressure control. Blood pressure is improving. Closely monitor vital signs.  Atypical chest pain:  Elevated troponin suspect demand ischemia in the setting of hypertensive urgency Troponin peaked at 94 and down trended 77 No evidence of acute ischemia on 12-lead EKG Echo shows LVEF 40 to 45%, with LVH  and normal RV function. Cardiology tentatively scheduled  left heart cath on Thursday.   Hyperlipidemia: Continue Crestor .   Hypokalemia: Replaced.  Continue to monitor   Type 2 diabetes, currently well-controlled: Last hemoglobin A1c 7.5 on 12/03/2022 Sensitive insulin  sliding scale.  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 Echo shows LVEF 40 to 45% with LVH and normal RV function. Continue Coreg  or irbesartan  and started on spironolactone .   DVT prophylaxis:  Lovenox  Code Status: Full code Family Communication: No family at bed side Disposition Plan:    Status is: Observation The patient remains OBS appropriate and will d/c before 2 midnights.   Admitted  for chest pain, scheduled for left heart cath tomorrow   Consultants:  Cardiology  Procedures: Scheduled LHC on Thursday.  Antimicrobials: None  Subjective: Patient was seen and examined at bedside.  Overnight events noted.   Patient reports feeling better, denies any chest pain. She is scheduled for left heart cath tomorrow.  Objective: Vitals:   06/26/23 0400 06/26/23 0808 06/26/23 1010 06/26/23 1305  BP: (!) 155/101 (!) 150/105 (!) 157/98 (!) 151/80  Pulse:  83  67  Resp: 18   18  Temp: 98.2 F (36.8 C)   98 F (36.7 C)  TempSrc: Oral   Oral  SpO2: 100%   99%  Weight:      Height:        Intake/Output Summary (Last 24 hours) at 06/26/2023 1447 Last data filed at 06/26/2023 0912 Gross per 24 hour  Intake 960 ml  Output --  Net 960 ml   Filed Weights   06/24/23  0126 06/24/23 1307  Weight: 58.2 kg 58.2 kg    Examination:  General exam: Appears calm and comfortable, not in any acute distress. Respiratory system: Clear to auscultation. Respiratory effort normal. RR 15 Cardiovascular system: S1 & S2 heard, RRR. No JVD, murmurs, rubs, gallops or clicks.  Gastrointestinal system: Abdomen is non distended, soft and non tender.  Normal bowel  sounds heard. Central nervous system: Alert and oriented X 3. No focal neurological deficits. Extremities: No edema, no cyanosis, no clubbing Skin: No rashes, lesions or ulcers Psychiatry: Judgement and insight appear normal. Mood & affect appropriate.     Data Reviewed: I have personally reviewed following labs and imaging studies  CBC: Recent Labs  Lab 06/23/23 1957 06/24/23 0240 06/24/23 0634  WBC 11.3* 10.8* 10.3  HGB 11.8* 11.6* 11.1*  HCT 36.0 34.4* 33.4*  MCV 86.3 85.8 86.3  PLT 348 332 309   Basic Metabolic Panel: Recent Labs  Lab 06/23/23 1957 06/24/23 0240 06/24/23 0634 06/26/23 0601  NA 141  --  137 136  K 3.4*  --  3.0* 3.5  CL 106  --  106 107  CO2 24  --  23 21*  GLUCOSE 94  --  121* 112*  BUN 14  --  11 18  CREATININE 0.64 0.61 0.52 0.55  CALCIUM  9.6  --  8.9 9.3  MG  --   --  2.1  --   PHOS  --   --  3.6  --    GFR: Estimated Creatinine Clearance: 55.3 mL/min (by C-G formula based on SCr of 0.55 mg/dL). Liver Function Tests: No results for input(s): AST, ALT, ALKPHOS, BILITOT, PROT, ALBUMIN  in the last 168 hours. No results for input(s): LIPASE, AMYLASE in the last 168 hours. No results for input(s): AMMONIA in the last 168 hours. Coagulation Profile: No results for input(s): INR, PROTIME in the last 168 hours. Cardiac Enzymes: No results for input(s): CKTOTAL, CKMB, CKMBINDEX, TROPONINI in the last 168 hours. BNP (last 3 results) No results for input(s): PROBNP in the last 8760 hours. HbA1C: No results for input(s): HGBA1C in the last 72 hours. CBG: Recent Labs  Lab 06/25/23 1130 06/25/23 1626 06/25/23 2032 06/26/23 0738 06/26/23 1121  GLUCAP 73 107* 114* 119* 113*   Lipid Profile: No results for input(s): CHOL, HDL, LDLCALC, TRIG, CHOLHDL, LDLDIRECT in the last 72 hours. Thyroid  Function Tests: No results for input(s): TSH, T4TOTAL, FREET4, T3FREE, THYROIDAB in the last 72  hours. Anemia Panel: No results for input(s): VITAMINB12, FOLATE, FERRITIN, TIBC, IRON, RETICCTPCT in the last 72 hours. Sepsis Labs: No results for input(s): PROCALCITON, LATICACIDVEN in the last 168 hours.  No results found for this or any previous visit (from the past 240 hours).   Radiology Studies: No results found.  Scheduled Meds:  amLODipine   10 mg Oral Daily   carvedilol   6.25 mg Oral BID WC   DULoxetine   30 mg Oral Daily   enoxaparin  (LOVENOX ) injection  40 mg Subcutaneous Q24H   insulin  aspart  0-9 Units Subcutaneous TID WC   irbesartan   300 mg Oral Daily   rosuvastatin   40 mg Oral Daily   spironolactone   25 mg Oral Daily   Continuous Infusions:   LOS: 1 day    Time spent: 35 mins    Darcel Dawley, MD [facility]   If 7PM-7AM, please contact night-coverage

## 2023-06-26 NOTE — Plan of Care (Signed)
   Problem: Education: Goal: Ability to describe self-care measures that may prevent or decrease complications (Diabetes Survival Skills Education) will improve Outcome: Progressing Goal: Individualized Educational Video(s) Outcome: Progressing

## 2023-06-26 NOTE — Progress Notes (Signed)
 Patient Name: [name] Date of Encounter: 06/26/2023 [facility] Cardiologist: Aleene Passe, MD   Interval Summary  .    Feeling better today  On for cath tomorrow    Vital Signs .    Vitals:   06/25/23 1402 06/25/23 2030 06/26/23 0400 06/26/23 0808  BP: 135/85 (!) 157/95 (!) 155/101 (!) 150/105  Pulse: 74 75  83  Resp: 16 18 18    Temp: 98.1 F (36.7 C) 98.7 F (37.1 C) 98.2 F (36.8 C)   TempSrc: Oral Oral Oral   SpO2: 98% 100% 100%   Weight:      Height:        Intake/Output Summary (Last 24 hours) at 06/26/2023 0823 Last data filed at 06/26/2023 0600 Gross per 24 hour  Intake 1200 ml  Output --  Net 1200 ml      06/24/2023    1:07 PM 06/24/2023    1:26 AM 05/02/2023   11:16 AM  Last 3 Weights  Weight (lbs) 128 lb 4.8 oz 128 lb 4.8 oz 106 lb  Weight (kg) 58.196 kg 58.196 kg 48.081 kg      Telemetry/ECG    Sinus rhythm heart rates in the 80s- Personally Reviewed  CV Studies    Echocardiogram 06/24/2023  1. Left ventricular ejection fraction, by estimation, is 40 to 45%. The  left ventricle has mildly decreased function. The left ventricle  demonstrates regional wall motion abnormalities (see scoring  diagram/findings for description). There is mild  concentric left ventricular hypertrophy. Left ventricular diastolic  parameters are consistent with Grade I diastolic dysfunction (impaired  relaxation). Elevated left ventricular end-diastolic pressure.   2. Right ventricular systolic function is normal. The right ventricular  size is normal.   3. Left atrial size was severely dilated.   4. The mitral valve is normal in structure. Trivial mitral valve  regurgitation. No evidence of mitral stenosis.   5. The aortic valve is normal in structure. Aortic valve regurgitation is  not visualized. No aortic stenosis is present.   6. The inferior vena cava is normal in size with greater than 50%  respiratory variability, suggesting right atrial  pressure of 3 mmHg.     Physical Exam .   Physical Exam: Blood pressure (!) 150/105, pulse 83, temperature 98.2 F (36.8 C), temperature source Oral, resp. rate 18, height 5' 1 (1.549 m), weight 58.2 kg, SpO2 100%.      GEN:  chronically ill appearing female  HEENT: Normal NECK: No JVD; No carotid bruits LYMPHATICS: No lymphadenopathy CARDIAC: RRR , no murmurs, rubs, gallops RESPIRATORY:  Clear to auscultation without rales, wheezing or rhonchi  ABDOMEN: Soft, non-tender, non-distended MUSCULOSKELETAL:  No edema; No deformity  SKIN: Warm and dry NEUROLOGIC:  Alert and oriented x 3  Patient Profile    [name] is a 68 y.o. female has hx of hypertension, diabetes, GERD, hyperlipidemia, TIA, small brain aneurysm and admitted on 06/24/2019 for for the evaluation of hypertensive urgency and chest pain.  Assessment & Plan .     Chest pain Nonexertional, reproducible with palpation and positional occurring at night generally when she is laying flat.  Denies that it feels like GERD though.  Has a strong family history of a father who died of a MI, and also has risk factors for CAD.  EKG with lateral T wave inversions, troponin peaked at 97 and trended down.   She is on the schedule for cath tomorrow   Check LP(a) Informed  Consent   Shared Decision Making/Informed Consent The risks [stroke (1 in 1000), death (1 in 1000), kidney failure [usually temporary] (1 in 500), bleeding (1 in 200), allergic reaction [possibly serious] (1 in 200)], benefits (diagnostic support and management of coronary artery disease) and alternatives of a cardiac catheterization were discussed in detail with Ms. Lainez and she is willing to proceed.     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  Echo here shows EF 40 to 45% with mild concentric LVH and normal RV function.  Severely dilated LA. Currently on carvedilol  6.25 mg, irbesartan  300 mg  Discontinue HCTZ  to allow further titration of GDMT. Spironolactone  was started yesterday   Hypertensive urgency  She was out of her meds for several months .  BP is slowly improving    Hypokalemia Better. Continue spironolactone      History of GERD    Hyperlipidemia LDL 178.  Needs better control, currently on rosuvastatin  40 mg.  For questions or updates, please contact [facility] Please consult [email or web address] for contact info under

## 2023-06-27 ENCOUNTER — Encounter (HOSPITAL_COMMUNITY)
Admission: EM | Disposition: A | Discharge status: Home Health | Payer: Self-pay | Source: Home / Self Care | Attending: Cardiovascular Disease

## 2023-06-27 DIAGNOSIS — I2510 Atherosclerotic heart disease of native coronary artery without angina pectoris: Secondary | ICD-10-CM | POA: Diagnosis not present

## 2023-06-27 DIAGNOSIS — I169 Hypertensive crisis, unspecified: Secondary | ICD-10-CM | POA: Diagnosis not present

## 2023-06-27 HISTORY — PX: LEFT HEART CATH AND CORONARY ANGIOGRAPHY: CATH118249

## 2023-06-27 LAB — BASIC METABOLIC PANEL
Anion gap: 7 (ref 5–15)
BUN: 15 mg/dL (ref 8–23)
CO2: 23 mmol/L (ref 22–32)
Calcium: 9.7 mg/dL (ref 8.9–10.3)
Chloride: 105 mmol/L (ref 98–111)
Creatinine, Ser: 0.68 mg/dL (ref 0.44–1.00)
GFR, Estimated: >60 mL/min (ref >60)
Glucose, Bld: 118 mg/dL — ABNORMAL HIGH (ref 70–99)
Potassium: 3.8 mmol/L (ref 3.5–5.1)
Sodium: 135 mmol/L (ref 135–145)

## 2023-06-27 LAB — CBC
HCT: 39.5 % (ref 36.0–46.0)
HCT: 42.1 % (ref 36.0–46.0)
Hemoglobin: 12.9 g/dL (ref 12.0–15.0)
Hemoglobin: 13.8 g/dL (ref 12.0–15.0)
MCH: 28.2 pg (ref 26.0–34.0)
MCH: 27.5 pg (ref 26.0–34.0)
MCHC: 32.7 g/dL (ref 30.0–36.0)
MCHC: 32.8 g/dL (ref 30.0–36.0)
MCV: 86.4 fL (ref 80.0–100.0)
MCV: 84 fL (ref 80.0–100.0)
Platelets: 362 10*3/uL (ref 150–400)
Platelets: 402 10*3/uL — ABNORMAL HIGH (ref 150–400)
RBC: 4.57 MIL/uL (ref 3.87–5.11)
RBC: 5.01 MIL/uL (ref 3.87–5.11)
RDW: 14.4 % (ref 11.5–15.5)
RDW: 14.4 % (ref 11.5–15.5)
WBC: 11.4 10*3/uL — ABNORMAL HIGH (ref 4.0–10.5)
WBC: 10.9 10*3/uL — ABNORMAL HIGH (ref 4.0–10.5)
nRBC: 0 % (ref 0.0–0.2)
nRBC: 0 % (ref 0.0–0.2)

## 2023-06-27 LAB — CREATININE, SERUM
Creatinine, Ser: 0.69 mg/dL (ref 0.44–1.00)
GFR, Estimated: >60 mL/min (ref >60)

## 2023-06-27 LAB — GLUCOSE, CAPILLARY
Glucose-Capillary: 132 mg/dL — ABNORMAL HIGH (ref 70–99)
Glucose-Capillary: 108 mg/dL — ABNORMAL HIGH (ref 70–99)
Glucose-Capillary: 161 mg/dL — ABNORMAL HIGH (ref 70–99)
Glucose-Capillary: 97 mg/dL (ref 70–99)

## 2023-06-27 LAB — LIPOPROTEIN A (LPA): Lipoprotein (a): <8.4 nmol/L (ref <75.0)

## 2023-06-27 SURGERY — LEFT HEART CATH AND CORONARY ANGIOGRAPHY
Anesthesia: LOCAL

## 2023-06-27 MED ORDER — SODIUM CHLORIDE 0.9% FLUSH
3.0000 mL | Freq: Two times a day (BID) | INTRAVENOUS | Status: DC
  Administered 2023-06-27 – 2023-07-02 (×9): 3 mL via INTRAVENOUS

## 2023-06-27 MED ORDER — IOHEXOL 350 MG/ML SOLN
INTRAVENOUS | Status: DC | PRN
  Administered 2023-06-27: 45 mL

## 2023-06-27 MED ORDER — MIDAZOLAM HCL 2 MG/2ML IJ SOLN
INTRAMUSCULAR | Status: AC
  Filled 2023-06-27: qty 2

## 2023-06-27 MED ORDER — LIDOCAINE HCL (PF) 1 % IJ SOLN
INTRAMUSCULAR | Status: DC | PRN
  Administered 2023-06-27: 2 mL

## 2023-06-27 MED ORDER — MIDAZOLAM HCL 2 MG/2ML IJ SOLN
INTRAMUSCULAR | Status: DC | PRN
  Administered 2023-06-27: 1 mg via INTRAVENOUS

## 2023-06-27 MED ORDER — LIDOCAINE HCL (PF) 1 % IJ SOLN
INTRAMUSCULAR | Status: AC
  Filled 2023-06-27: qty 30

## 2023-06-27 MED ORDER — HEPARIN (PORCINE) IN NACL 1000-0.9 UT/500ML-% IV SOLN
INTRAVENOUS | Status: DC | PRN
  Administered 2023-06-27 (×2): 500 mL

## 2023-06-27 MED ORDER — VERAPAMIL HCL 2.5 MG/ML IV SOLN
INTRAVENOUS | Status: AC
  Filled 2023-06-27: qty 2

## 2023-06-27 MED ORDER — SODIUM CHLORIDE 0.9 % IV SOLN
INTRAVENOUS | Status: AC
Start: 2023-06-27 — End: 2023-06-27

## 2023-06-27 MED ORDER — HEPARIN SODIUM (PORCINE) 1000 UNIT/ML IJ SOLN
INTRAMUSCULAR | Status: AC
  Filled 2023-06-27: qty 10

## 2023-06-27 MED ORDER — SODIUM CHLORIDE 0.9 % IV SOLN: 250.0000 mL | INTRAVENOUS | Status: AC | PRN

## 2023-06-27 MED ORDER — LABETALOL HCL 5 MG/ML IV SOLN
10.0000 mg | INTRAVENOUS | Status: AC | PRN
Start: 2023-06-27 — End: 2023-06-27

## 2023-06-27 MED ORDER — SODIUM CHLORIDE 0.9% FLUSH: 3.0000 mL | INTRAVENOUS | Status: DC | PRN

## 2023-06-27 MED ORDER — ENOXAPARIN SODIUM 60 MG/0.6ML IJ SOSY
50.0000 mg | PREFILLED_SYRINGE | Freq: Two times a day (BID) | INTRAMUSCULAR | Status: DC
  Administered 2023-06-27 – 2023-06-30 (×7): 50 mg via SUBCUTANEOUS
  Filled 2023-06-27 (×7): qty 0.6

## 2023-06-27 MED ORDER — ACETAMINOPHEN 325 MG PO TABS
650.0000 mg | ORAL_TABLET | ORAL | Status: DC | PRN
Start: 2023-06-27 — End: 2023-06-28

## 2023-06-27 MED ORDER — FENTANYL CITRATE (PF) 100 MCG/2ML IJ SOLN
INTRAMUSCULAR | Status: DC | PRN
  Administered 2023-06-27: 25 ug via INTRAVENOUS

## 2023-06-27 MED ORDER — FENTANYL CITRATE (PF) 100 MCG/2ML IJ SOLN
INTRAMUSCULAR | Status: AC
  Filled 2023-06-27: qty 2

## 2023-06-27 MED ORDER — VERAPAMIL HCL 2.5 MG/ML IV SOLN
INTRAVENOUS | Status: DC | PRN
  Administered 2023-06-27: 10 mL via INTRA_ARTERIAL

## 2023-06-27 MED ORDER — HEPARIN SODIUM (PORCINE) 1000 UNIT/ML IJ SOLN
INTRAMUSCULAR | Status: DC | PRN
  Administered 2023-06-27: 3000 [IU] via INTRAVENOUS

## 2023-06-27 SURGICAL SUPPLY — 8 items
CATH INFINITI AMBI 5FR TG (CATHETERS) IMPLANT
CATH INFINITI JR4 5F (CATHETERS) IMPLANT
DEVICE RAD COMP TR BAND LRG (VASCULAR PRODUCTS) IMPLANT
GLIDESHEATH SLEND SS 6F .021 (SHEATH) IMPLANT
GUIDEWIRE INQWIRE 1.5J.035X260 (WIRE) IMPLANT
INQWIRE 1.5J .035X260CM (WIRE) ×1
PACK CARDIAC CATHETERIZATION (CUSTOM PROCEDURE TRAY) ×2 IMPLANT
SET ATX-X65L (MISCELLANEOUS) IMPLANT

## 2023-06-27 NOTE — Plan of Care (Signed)
   Problem: Education: Goal: Ability to describe self-care measures that may prevent or decrease complications (Diabetes Survival Skills Education) will improve Outcome: Progressing Goal: Individualized Educational Video(s) Outcome: Progressing

## 2023-06-27 NOTE — Progress Notes (Signed)
 PROGRESS NOTE    [name]  FMW:[id number] DOB: 08/04/1955 DOA: 06/23/2023 PCP: Celestia Rosaline SQUIBB, NP   Brief Narrative: This 68 years old female with PMH significant for type 2 diabetes, diabetic polyneuropathy, hypertension, hyperlipidemia presented in the ED with complaints of nonexertional chest pain.  She describes chest pain as reproducible with palpation centrally located and intermittent for last 3 to 4 days.  She ran out of her blood pressure medications for last 3 months due to the death in the family or not able to afford it.  She was severely hypertensive on arrival in the ED with SBP 201/125.  She is also found to have slightly elevated troponins.  EDP has discussed with cardiology, who has recommended admission for uncontrolled hypertension.  Patient resumed on home blood pressure medications,  blood pressure is slowly improving.  Cardiology recommended ischemic evaluation once blood pressure is better controlled.  Scheduled for left heart cath on Thursday.  Assessment & Plan:   Principal Problem:   Hypertensive crisis Active Problems:   Hypertensive urgency   HFrEF (heart failure with reduced ejection fraction) (HCC)   Elevated troponin  Hypertensive urgency in the setting of medication noncompliance: Patient ran out of her blood pressure medications 3 months ago due to cost issue. She is unable to afford them. TOC consulted to assist with medications. Continue amlodipine  10 mg daily Continue Coreg  6.125 mg p.o. twice daily Continue Avapro  300 mg daily Continue hydralazine  10 mg IVP every 6 hours as needed Started on spironolactone  25 mg daily for better blood pressure control. Blood pressure is improving. Closely monitor vital signs.  Atypical chest pain:  Elevated troponin suspect demand ischemia in the setting of hypertensive urgency Troponin peaked at 94 and down trended 77 No evidence of acute ischemia on 12-lead EKG Echo shows LVEF 40 to 45%, with LVH  and normal RV function. Cardiology tentatively scheduled  left heart cath on Thursday due to decrease on LVEF. Left heart cath scheduled today.   Hyperlipidemia: Continue Crestor .   Hypokalemia: Replaced.  Continue to monitor   Type 2 diabetes, currently well-controlled: Last hemoglobin A1c 7.5 on 12/03/2022 Sensitive insulin  sliding scale.  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 Echo shows LVEF 40 to 45% with LVH and normal RV function. Continue Coreg  ,  irbesartan  and started on spironolactone .   DVT prophylaxis:  Lovenox  Code Status: Full code Family Communication: No family at bed side Disposition Plan:   Status is: Observation The patient remains OBS appropriate and will d/c before 2 midnights.     Admitted  for chest pain, scheduled for left heart cath today.   Consultants:  Cardiology  Procedures: Scheduled LHC on Thursday.  Antimicrobials: None  Subjective: Patient was seen and examined at bedside.  Overnight events noted.   Patient reports feeling better, denies any chest pain or shortness of breath. She is going to [facility] for left heart cath today.  Objective: Vitals:   06/27/23 0831 06/27/23 0951 06/27/23 1130 06/27/23 1200  BP: (!) 171/86 (!) 140/87 (!) 153/95 (!) 145/84  Pulse: 68  85 80  Resp:   (!) 29 20  Temp:      TempSrc:      SpO2:   99% 100%  Weight:      Height:        Intake/Output Summary (Last 24 hours) at 06/27/2023 1351 Last data filed at 06/27/2023 0305 Gross per 24 hour  Intake 391.79 ml  Output --  Net 391.79 ml   Filed Weights   06/24/23 0126 06/24/23 1307 06/27/23 0604  Weight: 58.2 kg 58.2 kg 48.2 kg    Examination:  General exam: Appears calm and comfortable, not in any acute distress. Respiratory system: CTA bilaterally. Respiratory effort normal. RR 15 Cardiovascular system: S1 & S2 heard, RRR. No JVD, murmurs, rubs, gallops or clicks.   Gastrointestinal system: Abdomen is non distended, soft and non tender.  Normal bowel sounds heard. Central nervous system: Alert and oriented X 3. No focal neurological deficits. Extremities: No edema, no cyanosis, no clubbing Skin: No rashes, lesions or ulcers Psychiatry: Judgement and insight appear normal. Mood & affect appropriate.     Data Reviewed: I have personally reviewed following labs and imaging studies  CBC: Recent Labs  Lab 06/23/23 1957 06/24/23 0240 06/24/23 0634 06/27/23 1001  WBC 11.3* 10.8* 10.3 11.4*  HGB 11.8* 11.6* 11.1* 12.9  HCT 36.0 34.4* 33.4* 39.5  MCV 86.3 85.8 86.3 86.4  PLT 348 332 309 362   Basic Metabolic Panel: Recent Labs  Lab 06/23/23 1957 06/24/23 0240 06/24/23 0634 06/26/23 0601 06/27/23 1001  NA 141  --  137 136 135  K 3.4*  --  3.0* 3.5 3.8  CL 106  --  106 107 105  CO2 24  --  23 21* 23  GLUCOSE 94  --  121* 112* 118*  BUN 14  --  11 18 15   CREATININE 0.64 0.61 0.52 0.55 0.68  CALCIUM  9.6  --  8.9 9.3 9.7  MG  --   --  2.1  --   --   PHOS  --   --  3.6  --   --    GFR: Estimated Creatinine Clearance: 50.8 mL/min (by C-G formula based on SCr of 0.68 mg/dL). Liver Function Tests: No results for input(s): AST, ALT, ALKPHOS, BILITOT, PROT, ALBUMIN  in the last 168 hours. No results for input(s): LIPASE, AMYLASE in the last 168 hours. No results for input(s): AMMONIA in the last 168 hours. Coagulation Profile: No results for input(s): INR, PROTIME in the last 168 hours. Cardiac Enzymes: No results for input(s): CKTOTAL, CKMB, CKMBINDEX, TROPONINI in the last 168 hours. BNP (last 3 results) No results for input(s): PROBNP in the last 8760 hours. HbA1C: No results for input(s): HGBA1C in the last 72 hours. CBG: Recent Labs  Lab 06/26/23 1121 06/26/23 1618 06/26/23 2102 06/27/23 0736 06/27/23 1133  GLUCAP 113* 91 137* 132* 108*   Lipid Profile: No results for input(s): CHOL, HDL,  LDLCALC, TRIG, CHOLHDL, LDLDIRECT in the last 72 hours. Thyroid  Function Tests: No results for input(s): TSH, T4TOTAL, FREET4, T3FREE, THYROIDAB in the last 72 hours. Anemia Panel: No results for input(s): VITAMINB12, FOLATE, FERRITIN, TIBC, IRON, RETICCTPCT in the last 72 hours. Sepsis Labs: No results for input(s): PROCALCITON, LATICACIDVEN in the last 168 hours.  Recent Results (from the past 240 hours)  Surgical PCR screen     Status: None   Collection Time: 06/26/23  8:30 PM   Specimen: Nasal Mucosa; Nasal Swab  Result Value Ref Range Status   MRSA, PCR NEGATIVE NEGATIVE Final   Staphylococcus aureus NEGATIVE NEGATIVE Final    Comment: (NOTE) The Xpert SA Assay (FDA approved for NASAL specimens in patients 21 years of age and older), is one component of a comprehensive surveillance program. It is not intended to diagnose infection nor to guide or monitor treatment. Performed at [facility], [address] [place],  [place] [address]      Radiology Studies: No results found.  Scheduled Meds:  [MAR Hold] amLODipine   10 mg Oral Daily   [MAR Hold] carvedilol   6.25 mg Oral BID WC   [MAR Hold] DULoxetine   30 mg Oral Daily   [MAR Hold] enoxaparin  (LOVENOX ) injection  40 mg Subcutaneous Q24H   [MAR Hold] insulin  aspart  0-9 Units Subcutaneous TID WC   [MAR Hold] irbesartan   300 mg Oral Daily   [MAR Hold] mupirocin  ointment  1 Application Nasal BID   [MAR Hold] rosuvastatin   40 mg Oral Daily   [MAR Hold] spironolactone   25 mg Oral Daily   Continuous Infusions:  sodium chloride  10 mL/hr at 06/26/23 2354     LOS: 1 day    Time spent: 35 mins    Darcel Dawley, MD [facility]   If 7PM-7AM, please contact night-coverage

## 2023-06-27 NOTE — Brief Op Note (Signed)
   BRIEF CARDIAC CATHETERIZATION NOTE  06/27/2023  3:05 PM  SURGEON:  Surgeons and Role: Anner Alm ORN, MD - Primary  PROCEDURE:  Procedure(s): LEFT HEART CATH AND CORONARY ANGIOGRAPHY (N/A)   PATIENT:  [name]  68 y.o. female with history of uncontrolled hypertension, DM-2, prior TIA (small brain aneurysm), HLD and GERD admitted on 06/24/2023 with hypertensive urgency/emergency and chest pain.  Echocardiogram showed moderately reduced EF of 40 to 45% with anterior hypokinesis.  Troponin elevated to mid 90s.  With elevated troponin and wall motion Sharol on echocardiogram she is referred for cardiac catheterization with possible PCI.  PRE-OPERATIVE DIAGNOSIS: Hypertensive Emergency  POST-OPERATIVE DIAGNOSIS: Severe multivessel CAD: Ostial and proximal LAD 80% stenosis with mild diffuse disease elsewhere Ostial and proximal RI 80% stenosis Proximal LCx 70% stenosis with mid to distal 95% stenosis after small OM 2 Mid RCA 80% Moderate reduced EF by echo 40 to 45% with anterior hypokinesis.   Normal LVEDP  Time Out: Verified patient identification, verified procedure, site/side was marked, verified correct patient position, special equipment/implants available, medications/allergies/relevent history reviewed, required imaging and test results available. Performed.  Access:  Right Radial Artery: 6 Fr sheath -- Seldinger technique using Micropuncture Kit -- Direct ultrasound guidance used.  Permanent image obtained and placed on chart. -- 10 mL radial cocktail IA; 3,000 Units IV Heparin   Left Heart Catheterization: 5Fr Catheters advanced or exchanged over a J-wire under direct fluoroscopic guidance into the ascending aorta; TIG 4.0 catheter advanced first.  * LV Hemodynamics (no LV Gram) & Left Coronary Artery Cineangiography: TIG 4.0 Catheter * Right Coronary Artery Cineangiography: JR4 Catheter  Review of initial angiography revealed: Multivessel CAD  Preparations are  made for CVTS consultation; initiation of treatment dose enoxaparin ; complete transfer to [facility]  Upon completion of Angiogaphy, the catheter was removed completely out of the body over a wire, without complication.  Radial sheath removed in the Cardiac Catheterization lab with TR Band placed for hemostasis.  TR Band: 1450  Hours; 12 mL air; reverse Barbeau AA  MEDICATIONS SQ Lidocaine  3 mL Radial Cocktail: 3 mg Verapmil in 10 mL NS Heparin : 3000 units  ANESTHESIA:   local and IV sedation  EBL:  <40 mL  DICTATION: .Note written in EPIC  PATIENT DISPOSITION:  PACU - hemodynamically stable.  PLAN OF CARE:  Transfer patient to [facility] under the Cardiology service.  CVTS consultation.  Will restart treatment /ACS dose of enoxaparin    Alm Anner, MD

## 2023-06-27 NOTE — Plan of Care (Signed)
 Pt s/p cath, TR band in place. Will continue to monitor. No s/s of bleeding.

## 2023-06-27 NOTE — Progress Notes (Cosign Needed Addendum)
 Patient Name: [name] Date of Encounter: 06/27/2023 [facility] Cardiologist: Aleene Passe, MD   Interval Summary  .    Plans for cardiac catheterization today.  Blood pressure still could improve still in the 15s0-170s, patient without any complaints today.  No chest pain or shortness of breath. Vital Signs .    Vitals:   06/26/23 2049 06/27/23 0539 06/27/23 0604 06/27/23 0831  BP: (!) 141/94 (!) 162/90  (!) 171/86  Pulse: 72 74  68  Resp: 20 14    Temp: 98.7 F (37.1 C) (!) 97.5 F (36.4 C)    TempSrc: Oral Oral    SpO2: 99% 99%    Weight:   48.2 kg   Height:        Intake/Output Summary (Last 24 hours) at 06/27/2023 0949 Last data filed at 06/27/2023 0305 Gross per 24 hour  Intake 391.79 ml  Output --  Net 391.79 ml      06/27/2023    6:04 AM 06/24/2023    1:07 PM 06/24/2023    1:26 AM  Last 3 Weights  Weight (lbs) 106 lb 4.2 oz 128 lb 4.8 oz 128 lb 4.8 oz  Weight (kg) 48.2 kg 58.196 kg 58.196 kg      Telemetry/ECG    Sinus rhythm heart rates in the 80s- Personally Reviewed  CV Studies    Echocardiogram 06/24/2023  1. Left ventricular ejection fraction, by estimation, is 40 to 45%. The  left ventricle has mildly decreased function. The left ventricle  demonstrates regional wall motion abnormalities (see scoring  diagram/findings for description). There is mild  concentric left ventricular hypertrophy. Left ventricular diastolic  parameters are consistent with Grade I diastolic dysfunction (impaired  relaxation). Elevated left ventricular end-diastolic pressure.   2. Right ventricular systolic function is normal. The right ventricular  size is normal.   3. Left atrial size was severely dilated.   4. The mitral valve is normal in structure. Trivial mitral valve  regurgitation. No evidence of mitral stenosis.   5. The aortic valve is normal in structure. Aortic valve regurgitation is  not visualized. No aortic stenosis is present.   6.  The inferior vena cava is normal in size with greater than 50%  respiratory variability, suggesting right atrial pressure of 3 mmHg.     Physical Exam .   GEN: No acute distress.   Neck: No JVD Cardiac: RRR, no murmurs, rubs, or gallops.  Respiratory: Clear to auscultation bilaterally. GI: Soft, nontender, non-distended  MS: No edema  Patient Profile    ANALISSA Hale is a 68 y.o. female has hx of hypertension, diabetes, GERD, hyperlipidemia, TIA, small brain aneurysm and admitted on 06/24/2019 for for the evaluation of hypertensive urgency and chest pain.  Assessment & Plan .     Chest pain Nonexertional, reproducible with palpation and positional occurring at night generally when she is laying flat.  Denies that it feels like GERD though.  Has a strong family history of a father who died of a MI, and also has risk factors for CAD.  EKG with lateral T wave inversions, troponin peaked at 97 and trended down.  Overall has a mix of atypical and typical features however given her mildly reduced EF with akinesis of her inferior lateral wall, ischemic etiology cannot be ruled out but also may be attributed to uncontrolled BP.    Plan for cardiac catheterization today. Normal LP(a)  Informed Consent   Shared Decision Making/Informed Consent The risks [  stroke (1 in 1000), death (1 in 1000), kidney failure [usually temporary] (1 in 500), bleeding (1 in 200), allergic reaction [possibly serious] (1 in 200)], benefits (diagnostic support and management of coronary artery disease) and alternatives of a cardiac catheterization were discussed in detail with Ms. Batson and she is willing to proceed.     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  Echo here shows EF 40 to 45% with mild concentric LVH and normal RV function.  Severely dilated LA.  Likely will transition to Entresto  post catheterization.  Currently on carvedilol  6.25 mg, irbesartan  300 mg  (may end up transitioning this to Entresto ), start spironolactone  25 mg.  Consider SGLT2 inhibitor at discharge.  Reports no UTIs or yeast infections. Discontinued HCTZ to allow further titration of GDMT.   Hypertensive urgency Reports being out of her blood pressure medications for 3 months secondary to being out of work due to injury as well as significant life stressors however reports she is able to afford her medications now.  BP is improved in the 150s to 170s.  Titrate medications post catheterization. Continue the above along with amlodipine  10 mg okay to continue this for now however if it interferes with titration of GDMT discontinue.  Hypokalemia Improved on yesterday labs, ordering BMP today.  History of GERD Reports her chest pain feeling differently.  May consider adding PPI, it is worse when lying flat.  Hyperlipidemia LDL 178.  Needs better control, currently on rosuvastatin  40 mg.  For questions or updates, please contact  [facility] Please consult [email or web address] for contact info under        Signed, Thom LITTIE Sluder, PA-C

## 2023-06-27 NOTE — Progress Notes (Signed)
 Patient arrived by [facility] from [facility]. Ambulatory. Saline lock left AC; flushed w/sterile saline. Denies pain.

## 2023-06-27 NOTE — Interval H&P Note (Signed)
 History and Physical Interval Note:  06/27/2023 2:14 PM  [name]  has presented today for surgery, with the diagnosis of chest pain - Unstable Angina ? Hypertensive Emergency   The various methods of treatment have been discussed with the patient and family. After consideration of risks, benefits and other options for treatment, the patient has consented to  Procedure(s): LEFT HEART CATH AND CORONARY ANGIOGRAPHY (N/A)  PERCUTANEOUS CORONARY INTERVENTION  as a surgical intervention.  The patient's history has been reviewed, patient examined, no change in status, stable for surgery.  I have reviewed the patient's chart and labs.  Questions were answered to the patient's satisfaction.    Cath Lab Visit (complete for each Cath Lab visit)  Clinical Evaluation Leading to the Procedure:   ACS: Yes.    Non-ACS:    Anginal Classification: CCS III  Anti-ischemic medical therapy: Minimal Therapy (1 class of medications)  Non-Invasive Test Results: No non-invasive testing performed  Prior CABG: No previous CABG   [name]

## 2023-06-28 ENCOUNTER — Inpatient Hospital Stay (HOSPITAL_COMMUNITY): Payer: Medicare Other

## 2023-06-28 ENCOUNTER — Encounter (HOSPITAL_COMMUNITY): Payer: Self-pay | Admitting: Cardiology

## 2023-06-28 DIAGNOSIS — F17210 Nicotine dependence, cigarettes, uncomplicated: Secondary | ICD-10-CM | POA: Diagnosis present

## 2023-06-28 DIAGNOSIS — Z7984 Long term (current) use of oral hypoglycemic drugs: Secondary | ICD-10-CM | POA: Diagnosis not present

## 2023-06-28 DIAGNOSIS — I25119 Atherosclerotic heart disease of native coronary artery with unspecified angina pectoris: Secondary | ICD-10-CM | POA: Diagnosis not present

## 2023-06-28 DIAGNOSIS — D62 Acute posthemorrhagic anemia: Secondary | ICD-10-CM | POA: Diagnosis not present

## 2023-06-28 DIAGNOSIS — E782 Mixed hyperlipidemia: Secondary | ICD-10-CM | POA: Diagnosis present

## 2023-06-28 DIAGNOSIS — J9811 Atelectasis: Secondary | ICD-10-CM | POA: Diagnosis not present

## 2023-06-28 DIAGNOSIS — I252 Old myocardial infarction: Secondary | ICD-10-CM | POA: Diagnosis not present

## 2023-06-28 DIAGNOSIS — E1142 Type 2 diabetes mellitus with diabetic polyneuropathy: Secondary | ICD-10-CM | POA: Diagnosis present

## 2023-06-28 DIAGNOSIS — I161 Hypertensive emergency: Secondary | ICD-10-CM | POA: Diagnosis present

## 2023-06-28 DIAGNOSIS — I5022 Chronic systolic (congestive) heart failure: Secondary | ICD-10-CM | POA: Diagnosis present

## 2023-06-28 DIAGNOSIS — I2510 Atherosclerotic heart disease of native coronary artery without angina pectoris: Secondary | ICD-10-CM | POA: Diagnosis not present

## 2023-06-28 DIAGNOSIS — I110 Hypertensive heart disease with heart failure: Secondary | ICD-10-CM | POA: Diagnosis present

## 2023-06-28 DIAGNOSIS — Z91148 Patient's other noncompliance with medication regimen for other reason: Secondary | ICD-10-CM | POA: Diagnosis not present

## 2023-06-28 DIAGNOSIS — I25110 Atherosclerotic heart disease of native coronary artery with unstable angina pectoris: Secondary | ICD-10-CM | POA: Diagnosis present

## 2023-06-28 DIAGNOSIS — Z8249 Family history of ischemic heart disease and other diseases of the circulatory system: Secondary | ICD-10-CM | POA: Diagnosis not present

## 2023-06-28 DIAGNOSIS — R079 Chest pain, unspecified: Secondary | ICD-10-CM | POA: Diagnosis present

## 2023-06-28 DIAGNOSIS — I444 Left anterior fascicular block: Secondary | ICD-10-CM | POA: Diagnosis present

## 2023-06-28 DIAGNOSIS — I200 Unstable angina: Secondary | ICD-10-CM | POA: Diagnosis not present

## 2023-06-28 DIAGNOSIS — I2489 Other forms of acute ischemic heart disease: Secondary | ICD-10-CM | POA: Diagnosis present

## 2023-06-28 DIAGNOSIS — I509 Heart failure, unspecified: Secondary | ICD-10-CM | POA: Diagnosis not present

## 2023-06-28 DIAGNOSIS — Z01810 Encounter for preprocedural cardiovascular examination: Secondary | ICD-10-CM | POA: Diagnosis not present

## 2023-06-28 DIAGNOSIS — E119 Type 2 diabetes mellitus without complications: Secondary | ICD-10-CM | POA: Diagnosis not present

## 2023-06-28 DIAGNOSIS — I671 Cerebral aneurysm, nonruptured: Secondary | ICD-10-CM | POA: Diagnosis present

## 2023-06-28 DIAGNOSIS — E876 Hypokalemia: Secondary | ICD-10-CM | POA: Diagnosis present

## 2023-06-28 DIAGNOSIS — Z79899 Other long term (current) drug therapy: Secondary | ICD-10-CM | POA: Diagnosis not present

## 2023-06-28 DIAGNOSIS — R7989 Other specified abnormal findings of blood chemistry: Secondary | ICD-10-CM | POA: Diagnosis not present

## 2023-06-28 DIAGNOSIS — I255 Ischemic cardiomyopathy: Secondary | ICD-10-CM | POA: Diagnosis present

## 2023-06-28 DIAGNOSIS — Z7982 Long term (current) use of aspirin: Secondary | ICD-10-CM | POA: Diagnosis not present

## 2023-06-28 DIAGNOSIS — I169 Hypertensive crisis, unspecified: Secondary | ICD-10-CM | POA: Diagnosis present

## 2023-06-28 DIAGNOSIS — I119 Hypertensive heart disease without heart failure: Secondary | ICD-10-CM | POA: Diagnosis not present

## 2023-06-28 DIAGNOSIS — K219 Gastro-esophageal reflux disease without esophagitis: Secondary | ICD-10-CM | POA: Diagnosis present

## 2023-06-28 DIAGNOSIS — F0390 Unspecified dementia without behavioral disturbance: Secondary | ICD-10-CM | POA: Diagnosis present

## 2023-06-28 DIAGNOSIS — M159 Polyosteoarthritis, unspecified: Secondary | ICD-10-CM | POA: Diagnosis present

## 2023-06-28 LAB — BASIC METABOLIC PANEL
Anion gap: 8 (ref 5–15)
BUN: 19 mg/dL (ref 8–23)
CO2: 26 mmol/L (ref 22–32)
Calcium: 9.4 mg/dL (ref 8.9–10.3)
Chloride: 103 mmol/L (ref 98–111)
Creatinine, Ser: 0.74 mg/dL (ref 0.44–1.00)
GFR, Estimated: >60 mL/min (ref >60)
Glucose, Bld: 107 mg/dL — ABNORMAL HIGH (ref 70–99)
Potassium: 3.8 mmol/L (ref 3.5–5.1)
Sodium: 137 mmol/L (ref 135–145)

## 2023-06-28 LAB — CBC
HCT: 38.7 % (ref 36.0–46.0)
Hemoglobin: 12.7 g/dL (ref 12.0–15.0)
MCH: 27.8 pg (ref 26.0–34.0)
MCHC: 32.8 g/dL (ref 30.0–36.0)
MCV: 84.7 fL (ref 80.0–100.0)
Platelets: 385 10*3/uL (ref 150–400)
RBC: 4.57 MIL/uL (ref 3.87–5.11)
RDW: 14.2 % (ref 11.5–15.5)
WBC: 12.3 10*3/uL — ABNORMAL HIGH (ref 4.0–10.5)
nRBC: 0 % (ref 0.0–0.2)

## 2023-06-28 LAB — PULMONARY FUNCTION TEST
FEF 25-75 Pre: 1.36 L/s
FEF2575-%Pred-Pre: 74 %
FEV1-%Pred-Pre: 84 %
FEV1-Pre: 1.74 L
FEV1FVC-%Pred-Pre: 99 %
FEV6-%Pred-Pre: 87 %
FEV6-Pre: 2.26 L
FEV6FVC-%Pred-Pre: 103 %
FVC-%Pred-Pre: 84 %
FVC-Pre: 2.28 L
Pre FEV1/FVC ratio: 76 %
Pre FEV6/FVC Ratio: 99 %

## 2023-06-28 LAB — GLUCOSE, CAPILLARY
Glucose-Capillary: 106 mg/dL — ABNORMAL HIGH (ref 70–99)
Glucose-Capillary: 135 mg/dL — ABNORMAL HIGH (ref 70–99)
Glucose-Capillary: 74 mg/dL (ref 70–99)
Glucose-Capillary: 146 mg/dL — ABNORMAL HIGH (ref 70–99)

## 2023-06-28 MED ORDER — ACETAMINOPHEN 325 MG PO TABS
650.0000 mg | ORAL_TABLET | ORAL | Status: DC | PRN
  Administered 2023-07-01 – 2023-07-02 (×3): 650 mg via ORAL
  Filled 2023-06-28 (×3): qty 2

## 2023-06-28 MED ORDER — SACUBITRIL-VALSARTAN 97-103 MG PO TABS
1.0000 | ORAL_TABLET | Freq: Two times a day (BID) | ORAL | Status: AC
  Administered 2023-06-28 – 2023-07-01 (×7): 1 via ORAL
  Filled 2023-06-28 (×7): qty 1

## 2023-06-28 NOTE — Progress Notes (Signed)
 Patient Name: [name] Date of Encounter: 06/28/2023 [facility] Cardiologist: Aleene Passe, MD   Interval Summary  .    Feeling better today  Cath reveals severe 3 V cad  Ostial and proximal LAD 80% stenosis with mild diffuse disease elsewhere Ostial and proximal RI 80% stenosis Proximal LCx 70% stenosis with mid to distal 95% stenosis after small OM 2 Mid RCA 80% Moderate reduced EF by echo 40 to 45% with anterior hypokinesis.   Normal LVEDP  BP remains a bit elevated  Her EF is 40-45% Will change the Irbesartan  to Entresto  and titrate up as needed / as tolerated      Vital Signs .    Vitals:   06/27/23 2008 06/28/23 0345 06/28/23 0804 06/28/23 0805  BP: 136/88 130/83  (!) 160/105  Pulse: 80 74    Resp: 18 16 18    Temp: 97.8 F (36.6 C) 98.1 F (36.7 C) 98.7 F (37.1 C)   TempSrc: Oral Oral Oral   SpO2: 100% 100%    Weight:      Height:        Intake/Output Summary (Last 24 hours) at 06/28/2023 0935 Last data filed at 06/27/2023 2045 Gross per 24 hour  Intake 240 ml  Output --  Net 240 ml      06/27/2023    6:04 AM 06/24/2023    1:07 PM 06/24/2023    1:26 AM  Last 3 Weights  Weight (lbs) 106 lb 4.2 oz 128 lb 4.8 oz 128 lb 4.8 oz  Weight (kg) 48.2 kg 58.196 kg 58.196 kg      Telemetry/ECG    Sinus rhythm heart rates in the 80s- Personally Reviewed  CV Studies    Echocardiogram 06/24/2023  1. Left ventricular ejection fraction, by estimation, is 40 to 45%. The  left ventricle has mildly decreased function. The left ventricle  demonstrates regional wall motion abnormalities (see scoring  diagram/findings for description). There is mild  concentric left ventricular hypertrophy. Left ventricular diastolic  parameters are consistent with Grade I diastolic dysfunction (impaired  relaxation). Elevated left ventricular end-diastolic pressure.   2. Right ventricular systolic function is normal. The right ventricular  size is normal.    3. Left atrial size was severely dilated.   4. The mitral valve is normal in structure. Trivial mitral valve  regurgitation. No evidence of mitral stenosis.   5. The aortic valve is normal in structure. Aortic valve regurgitation is  not visualized. No aortic stenosis is present.   6. The inferior vena cava is normal in size with greater than 50%  respiratory variability, suggesting right atrial pressure of 3 mmHg.     Physical Exam .     Physical Exam: Blood pressure (!) 160/105, pulse 74, temperature 98.7 F (37.1 C), temperature source Oral, resp. rate 18, height 5' 1 (1.549 m), weight 48.2 kg, SpO2 100%.       GEN:  Well nourished, well developed in no acute distress HEENT: Normal NECK: No JVD; No carotid bruits LYMPHATICS: No lymphadenopathy CARDIAC: RRR   RESPIRATORY:  Clear to auscultation without rales, wheezing or rhonchi  ABDOMEN: Soft, non-tender, non-distended MUSCULOSKELETAL:  right radial cath site looks good  SKIN: Warm and dry NEUROLOGIC:  Alert and oriented x 3   Patient Profile    [name] is a 68 y.o. female has hx of hypertension, diabetes, GERD, hyperlipidemia, TIA, small brain aneurysm and admitted on 06/24/2019 for for the evaluation of hypertensive urgency and  chest pain.  Assessment & Plan .     CAD :  Has 3 V cad by cath.  TCTS is aware .  No CP       New onset heart failure with mildly reduced EF Asymptomatic and well compensated.  Echo here shows EF 40 to 45% with mild concentric LVH and normal RV function.  Severely   Will start entresto  instead of Irbesartan    Hypertensive urgency Adding entresto .  DC Irbesartan      Hypokalemia Better. Continue spironolactone      History of GERD    Hyperlipidemia LDL 178.  Needs better control, currently on rosuvastatin  40 mg.   For questions or updates, please contact [facility] Please consult [email or web address] for contact info under

## 2023-06-28 NOTE — H&P (View-Only) (Signed)
 [address]       [place] [address]             [phone]        TALEA MANGES [facility] Medical Record #[id number] Date of Birth: 05/11/1956  Primary Care: Celestia Rosaline SQUIBB, NP Primary Cardiologist:Philip Nahser, MD  Chief Complaint:    Chief Complaint  Patient presents with   Chest Pain  Reason for consultation: Coronary artery disease  History of Present Illness:     This is a 68 year old female with a past medical history of hypertension, hyperlipidemia, DM with neuropathy, GERD,  small left brain aneurysm, TIA (patient not aware of this), family history of CAD (father died of MI in his 50's),anxiety who presented to [facility] on 06/24/2023 with complaints of waxing and waning chest pain, in the setting of a hypertensive emergency. Per medical documentation, she had chest pain, worse with palpation on exam. Patient notes she also had one episode of nausea and vomiting but denied diaphoresis and LE edema. EKG showed sinus rhythm, LAFB, lateral TWI. Initial Troponin I (high sensitivity)  was 81 and max's at 94. CXR showed no acute abnormality. She was transferred to [facility] for further evaluation and treatment. Echo done 06/24/2023 showed LVEF 40-45%, regional wall motion abnormalities, trivial MR, no AI or AS, aortic root is normal in size and structure, and no pericardial effusion. Cardiac catheterization done 06/27/2023 showed LVEF 40-45%, ostial and proximal 80% LAD stenosis, left Circumflex with a 70% stenosis with mid to distal 95% stenosis, 80% stenosis of Ramus Intermediate, and mid RCA 80% stenosis.  She reports that she has a had a rough past year-loss of her mother, living arrangements, and medication affordability. She has essentially put her on health on the back burner. Her niece is helping her look for a place to live, as she was in an extended stay place prior to presenting to the hospital.  Cardiothoracic consultation was  requested for the consideration of coronary artery bypass grafting surgery. At the time of my exam, she denies chest pain or shortness of breath.   Current Activity/ Functional Status: Patient is independent with mobility/ambulation, transfers, ADL's, IADL's.   Zubrod Score: At the time of surgery this patient's most appropriate activity status/level should be described as: []     0    Normal activity, no symptoms [x]     1    Restricted in physical strenuous activity but ambulatory, able to do out light work []     2    Ambulatory and capable of self care, unable to do work activities, up and about more than 50%  Of the time                            []     3    Only limited self care, in bed greater than 50% of waking hours []     4    Completely disabled, no self care, confined to bed or chair []     5    Moribund  Past Medical History:  Diagnosis Date   Anemia    hx   Aneurysm (HCC)    small, on left side of brain    Anxiety    hx of   Arthritis    generalized   Diabetes mellitus without complication (HCC)    diet control, pt denies- on medications   GERD (gastroesophageal  reflux disease)    iwht certain foods/on meds/OTC PRN meds   Hyperlipidemia    on meds   Hypertension    on meds   TIA (transient ischemic attack)    pt unaware of this hx on 06/19/2017    Past Surgical History:  Procedure Laterality Date   ANTERIOR CERVICAL DECOMP/DISCECTOMY FUSION     BACK SURGERY     CARPAL TUNNEL RELEASE Left 2024   CESAREAN SECTION  1986   FOOT SURGERY Left 11/2019   IR GENERIC HISTORICAL  09/21/2014   IR ANGIO INTRA EXTRACRAN SEL INTERNAL CAROTID UNI L MOD SED 09/21/2014 Gerldine Maizes, MD [facility]   IR GENERIC HISTORICAL  09/21/2014   IR ANGIO INTRA EXTRACRAN SEL COM CAROTID INNOMINATE UNI R MOD SED 09/21/2014 Gerldine Maizes, MD [facility]   IR GENERIC HISTORICAL  09/21/2014   IR ANGIO VERTEBRAL SEL VERTEBRAL UNI L MOD SED 09/21/2014 Gerldine Maizes, MD [facility]   IR GENERIC HISTORICAL  09/21/2014   IR 3D INDEPENDENT WKST 09/21/2014 Gerldine Maizes, MD [facility]   LAPAROSCOPIC CHOLECYSTECTOMY     LEFT HEART CATH AND CORONARY ANGIOGRAPHY N/A 06/27/2023   Procedure: LEFT HEART CATH AND CORONARY ANGIOGRAPHY;  Surgeon: Anner Alm ORN, MD;  Location: [facility] INVASIVE CV LAB;  Service: Cardiovascular;  Laterality: N/A;   TUBAL LIGATION     TUBAL LIGATION      Social History   Tobacco Use  Smoking Status Former   Types: Cigarettes  Smokeless Tobacco Never    Social History   Substance and Sexual Activity  Alcohol Use Not Currently   Comment: socailly    Allergies: No Known Allergies  Current Facility-Administered Medications  Medication Dose Route Frequency Provider Last Rate Last Admin   0.9 %  sodium chloride  infusion  250 mL Intravenous PRN Anner Alm ORN, MD       acetaminophen  (TYLENOL ) tablet 650 mg  650 mg Oral Q4H PRN Nahser, Aleene PARAS, MD       amLODipine  (NORVASC ) tablet 10 mg  10 mg Oral Daily Anner Alm ORN, MD   10 mg at 06/28/23 0849   carvedilol  (COREG ) tablet 6.25 mg  6.25 mg Oral BID WC Anner Alm ORN, MD   6.25 mg at 06/28/23 0849   DULoxetine  (CYMBALTA ) DR capsule 30 mg  30 mg Oral Daily Anner Alm ORN, MD   30 mg at 06/28/23 0849   enoxaparin  (LOVENOX ) injection 50 mg  50 mg Subcutaneous Q12H Anner Alm ORN, MD   50 mg at 06/28/23 0849   hydrALAZINE  (APRESOLINE ) injection 10 mg  10 mg Intravenous Q8H PRN Anner Alm ORN, MD   10 mg at 06/27/23 9161   insulin  aspart (novoLOG ) injection 0-9 Units  0-9 Units Subcutaneous TID WC Anner Alm ORN, MD   2 Units at 06/27/23 1646   melatonin tablet 5 mg  5 mg Oral QHS PRN Anner Alm ORN, MD       morphine  (PF) 2 MG/ML injection 2 mg  2 mg Intravenous Q3H PRN Anner Alm ORN, MD   2 mg at 06/24/23 0255   polyethylene glycol (MIRALAX  / GLYCOLAX ) packet 17 g  17 g Oral Daily PRN Anner Alm ORN, MD       rosuvastatin  (CRESTOR ) tablet 40 mg  40 mg Oral Daily  Anner Alm ORN, MD   40 mg at 06/28/23 0849   sacubitril -valsartan  (ENTRESTO ) 97-103 mg per tablet  1 tablet Oral BID Nahser, Aleene PARAS, MD  sodium chloride  flush (NS) 0.9 % injection 3 mL  3 mL Intravenous Q12H Anner Alm ORN, MD   3 mL at 06/28/23 0850   sodium chloride  flush (NS) 0.9 % injection 3 mL  3 mL Intravenous PRN Anner Alm ORN, MD       spironolactone  (ALDACTONE ) tablet 25 mg  25 mg Oral Daily Anner Alm ORN, MD   25 mg at 06/28/23 9150    Medications Prior to Admission  Medication Sig Dispense Refill Last Dose/Taking   amLODipine  (NORVASC ) 10 MG tablet Take 1 tablet (10 mg total) by mouth daily. 90 tablet 1 Past Month   dapagliflozin  propanediol (FARXIGA ) 5 MG TABS tablet Take 1 tablet (5 mg total) by mouth daily. 90 tablet 1 Past Month   dicyclomine  (BENTYL ) 10 MG capsule Take 1 capsule (10 mg total) by mouth 3 (three) times daily before meals. 90 capsule 11 Past Month   DULoxetine  (CYMBALTA ) 30 MG capsule Take 1 capsule (30 mg total) by mouth daily. 180 capsule 1 Past Month   loperamide  (IMODIUM ) 2 MG capsule Take 1 capsule (2 mg total) by mouth 4 (four) times daily as needed for diarrhea or loose stools. 12 capsule 0 Past Month   losartan -hydrochlorothiazide  (HYZAAR ) 100-25 MG tablet Take 1 tablet by mouth daily. 90 tablet 1 Past Month   methocarbamol  (ROBAXIN ) 500 MG tablet Take 1 tablet (500 mg total) by mouth every 8 (eight) hours as needed for muscle spasms. 20 tablet 0 Past Month   metoprolol  succinate (TOPROL -XL) 25 MG 24 hr tablet TAKE ONE TABLET (25MG  TOTAL) BY MOUTH DAILY AT 9AM 90 tablet 1 Past Month   ondansetron  (ZOFRAN ) 4 MG tablet Take 1 tablet (4 mg total) by mouth every 8 (eight) hours as needed for nausea or vomiting. 90 tablet 11 Past Month   ondansetron  (ZOFRAN ) 8 MG tablet Take one prior to each prep dose. 2 tablet 0 Past Month   oxyCODONE -acetaminophen  (PERCOCET/ROXICET) 5-325 MG tablet Take 1 tablet by mouth every 6 (six) hours as needed for  moderate pain or severe pain.   Past Month   pantoprazole  (PROTONIX ) 40 MG tablet TAKE ONE TABLET (40mg  total) BY MOUTH DAILY AT 9AM (Patient taking differently: Take 40 mg by mouth daily as needed (for heartburn).) 30 tablet 0 Past Month   pregabalin (LYRICA) 50 MG capsule Take 50 mg by mouth 3 (three) times daily.   Past Month   rosuvastatin  (CRESTOR ) 40 MG tablet Take 1 tablet (40 mg total) by mouth daily. 90 tablet 1 Past Month   potassium chloride  SA (KLOR-CON  M) 20 MEQ tablet Take 2 tablets (40 mEq total) by mouth daily for 3 days. (Patient not taking: Reported on 06/24/2023) 6 tablet 0 Not Taking   promethazine -dextromethorphan (PROMETHAZINE -DM) 6.25-15 MG/5ML syrup Take 2.5 mLs by mouth 4 (four) times daily as needed for cough. (Patient not taking: Reported on 06/24/2023) 180 mL 0 Not Taking    Family History  Problem Relation Age of Onset   Hypertension Mother    Arthritis Mother    Diabetes Mother    Dementia Mother    Heart attack Father    Heart disease Father    Hypertension Sister    Diabetes Sister    Hypertension Sister    Heart disease Sister    Diabetes Sister    Hypertension Brother    Heart disease Brother    Diabetes Brother    Hypertension Brother    Hypertension Brother    Hypertension Brother  Cancer Neg Hx    Colon cancer Neg Hx    Colon polyps Neg Hx    Esophageal cancer Neg Hx    Rectal cancer Neg Hx    Stomach cancer Neg Hx    Breast cancer Neg Hx    Review of Systems:    Cardiac Review of Systems: Y or  [  N  ]= no  Chest Pain [ on admission   ] Exertional SOB  [ N ]     Pedal Edema [  N ]    Syncope  DISCORDIA.DIESEL  ]     General Review of Systems: [Y] = yes [ N ]=no Constitional:  fatigue [  Y]; nausea [ Y ]; ; fever [ N ]; or chills DISCORDIA.DIESEL  ]                                                               Dental: Last Dentist visit: Has been awhile  Eye : cataracts [Y] Resp: cough [ N ];  wheezing[ N ];  hemoptysis[N  ];  GI:   dysphagia[ N ]; melena[  N];   hematochezia [ N ]; heartburn[Y  ];    GU: hematuria[N  ];                Skin: rash, swelling[ N ];,   peripheral edema[ N ];   Musculosketetal:  joint swelling[ N ];    Heme/Lymph: bruising[  ];  bleeding[  ];  anemia[  ];  Neuro: TIA[Y  ];  difficulty walking[ N ];  Psych:anxiety[Y  ];  Endocrine: diabetes[  Y];        Physical Exam: BP (!) 160/105 (BP Location: Left Arm)   Pulse 74   Temp 98.7 F (37.1 C) (Oral)   Resp 18   Ht 5' 1 (1.549 m)   Wt 48.2 kg   SpO2 100%   BMI 20.08 kg/m    General appearance: alert, cooperative, and no distress Head: Normocephalic, without obvious abnormality, atraumatic Neck: no carotid bruit, no JVD, and supple, symmetrical, trachea midline Resp: clear to auscultation bilaterally Cardio: RRR, no murmur GI: Soft, non tender, bowel sounds present Extremities: No LE edema. Palpable PT bilaterally Neurologic: Grossly normal  Diagnostic Studies & Laboratory data:  Echo done 06/24/2023 IMPRESSIONS     1. Left ventricular ejection fraction, by estimation, is 40 to 45%. The  left ventricle has mildly decreased function. The left ventricle  demonstrates regional wall motion abnormalities (see scoring  diagram/findings for description). There is mild  concentric left ventricular hypertrophy. Left ventricular diastolic  parameters are consistent with Grade I diastolic dysfunction (impaired  relaxation). Elevated left ventricular end-diastolic pressure.   2. Right ventricular systolic function is normal. The right ventricular  size is normal.   3. Left atrial size was severely dilated.   4. The mitral valve is normal in structure. Trivial mitral valve  regurgitation. No evidence of mitral stenosis.   5. The aortic valve is normal in structure. Aortic valve regurgitation is  not visualized. No aortic stenosis is present.   6. The inferior vena cava is normal in size with greater than 50%  respiratory variability, suggesting right atrial  pressure of 3 mmHg.   FINDINGS   Left Ventricle: Left ventricular  ejection fraction, by estimation, is 40  to 45%. The left ventricle has mildly decreased function. The left  ventricle demonstrates regional wall motion abnormalities. The left  ventricular internal cavity size was normal  in size. There is mild concentric left ventricular hypertrophy. Left  ventricular diastolic parameters are consistent with Grade I diastolic  dysfunction (impaired relaxation). Elevated left ventricular end-diastolic  pressure.     LV Wall Scoring:  The entire anterior wall and entire lateral wall are hypokinetic. The  entire  septum, entire inferior wall, and apex are normal.   Right Ventricle: The right ventricular size is normal. No increase in  right ventricular wall thickness. Right ventricular systolic function is  normal.   Left Atrium: Left atrial size was severely dilated.   Right Atrium: Right atrial size was normal in size.   Pericardium: There is no evidence of pericardial effusion.   Mitral Valve: The mitral valve is normal in structure. Trivial mitral  valve regurgitation. No evidence of mitral valve stenosis.   Tricuspid Valve: The tricuspid valve is normal in structure. Tricuspid  valve regurgitation is not demonstrated. No evidence of tricuspid  stenosis.   Aortic Valve: The aortic valve is normal in structure. Aortic valve  regurgitation is not visualized. No aortic stenosis is present. Aortic  valve mean gradient measures 4.0 mmHg. Aortic valve peak gradient measures  7.6 mmHg. Aortic valve area, by VTI  measures 1.78 cm.   Pulmonic Valve: The pulmonic valve was normal in structure. Pulmonic valve  regurgitation is not visualized. No evidence of pulmonic stenosis.   Aorta: The aortic root is normal in size and structure.   Venous: The inferior vena cava is normal in size with greater than 50%  respiratory variability, suggesting right atrial pressure of 3 mmHg.    Recent Radiology Findings:   CARDIAC CATHETERIZATION Result Date: 06/27/2023 Images from the original result were not included. POST-OPERATIVE DIAGNOSIS: Severe multivessel CAD: Dominance: Right Ostial and proximal LAD 80% stenosis with mild diffuse disease elsewhere Ostial and proximal RI 80% stenosis Proximal LCx 70% stenosis with mid to distal 95% stenosis after small OM 2 Mid RCA 80% Moderate reduced EF by echo 40 to 45% with anterior hypokinesis.  Normal LVEDP RECOMMENDATION: Transfer patient to [facility] under the Cardiology service Patient will require CVTS consultation for CABG => Anticipated discharge date to be determined. Will require additional GDMT management of Hypertension, CAD and CHf  Will restart treatment /ACS dose of enoxaparin  With ACS presentation would not be unreasonable to load clopidogrel  300 mg and start 75 mg daily post CABG [name], MD    Normal LVEDP  I have independently reviewed the above radiologic studies and discussed with the patient   Recent Lab Findings: Lab Results  Component Value Date   WBC 12.3 (H) 06/28/2023   HGB 12.7 06/28/2023   HCT 38.7 06/28/2023   PLT 385 06/28/2023   GLUCOSE 107 (H) 06/28/2023   CHOL 268 (H) 05/03/2023   TRIG 142 05/03/2023   HDL 64 05/03/2023   LDLCALC 178 (H) 05/03/2023   ALT 10 05/03/2023   AST 14 05/03/2023   NA 137 06/28/2023   K 3.8 06/28/2023   CL 103 06/28/2023   CREATININE 0.74 06/28/2023   BUN 19 06/28/2023   CO2 26 06/28/2023   TSH 1.900 01/29/2020   INR 0.95 06/19/2017   HGBA1C 6.4 05/02/2023   Assessment / Plan:   Coronary artery disease-multivessel CAD with LVEF 40-45%. Dr. Kerrin to review catheterization films  and determine if candidate for coronary artery bypass grafting surgery. If she is a candidate, first available OR date Wednesday 07/03/2023. 2. Hypertensive urgency-on Amlodipine  10 mg daily, Carvedilol  6.25 mg bid, and Entresto  97/103 bid 3. History of hyperlipidemia-on  Rosuvastatin  40 mg daily 4. History of DM and diabetic neuropathy-will check HGA1C. On Farxiga  prior to admission  I  spent 20 minutes counseling the patient face to face.   Donielle Zimmerman PA-C 06/28/2023 10:18 AM  I have seen and examined Ms. Neyer and reviewed her records and cath images.  68 yo woman with multiple cardiac risk factors who presented with CP.  + troponin.  Cath showed 3 vessel CAD.    CABG indicated for survival benefit and relief of symptoms.  Plan CABG Wed 07/03/2023.  See risk discussion outlined in progress note from 1/6  Ardella Chhim C. Kerrin, MD [facility] [phone]

## 2023-06-28 NOTE — Consult Note (Addendum)
 [address]       [place] [address]             [phone]        TALEA MANGES [facility] Medical Record #[id number] Date of Birth: 05/11/1956  Primary Care: Celestia Rosaline SQUIBB, NP Primary Cardiologist:Philip Nahser, MD  Chief Complaint:    Chief Complaint  Patient presents with   Chest Pain  Reason for consultation: Coronary artery disease  History of Present Illness:     This is a 68 year old female with a past medical history of hypertension, hyperlipidemia, DM with neuropathy, GERD,  small left brain aneurysm, TIA (patient not aware of this), family history of CAD (father died of MI in his 50's),anxiety who presented to [facility] ED on 06/24/2023 with complaints of waxing and waning chest pain, in the setting of a hypertensive emergency. Per medical documentation, she had chest pain, worse with palpation on exam. Patient notes she also had one episode of nausea and vomiting but denied diaphoresis and LE edema. EKG showed sinus rhythm, LAFB, lateral TWI. Initial Troponin I (high sensitivity)  was 81 and max's at 94. CXR showed no acute abnormality. She was transferred to [facility] for further evaluation and treatment. Echo done 06/24/2023 showed LVEF 40-45%, regional wall motion abnormalities, trivial MR, no AI or AS, aortic root is normal in size and structure, and no pericardial effusion. Cardiac catheterization done 06/27/2023 showed LVEF 40-45%, ostial and proximal 80% LAD stenosis, left Circumflex with a 70% stenosis with mid to distal 95% stenosis, 80% stenosis of Ramus Intermediate, and mid RCA 80% stenosis.  She reports that she has a had a rough past year-loss of her mother, living arrangements, and medication affordability. She has essentially put her on health on the back burner. Her niece is helping her look for a place to live, as she was in an extended stay place prior to presenting to the hospital.  Cardiothoracic consultation was  requested for the consideration of coronary artery bypass grafting surgery. At the time of my exam, she denies chest pain or shortness of breath.   Current Activity/ Functional Status: Patient is independent with mobility/ambulation, transfers, ADL's, IADL's.   Zubrod Score: At the time of surgery this patient's most appropriate activity status/level should be described as: []     0    Normal activity, no symptoms [x]     1    Restricted in physical strenuous activity but ambulatory, able to do out light work []     2    Ambulatory and capable of self care, unable to do work activities, up and about more than 50%  Of the time                            []     3    Only limited self care, in bed greater than 50% of waking hours []     4    Completely disabled, no self care, confined to bed or chair []     5    Moribund  Past Medical History:  Diagnosis Date   Anemia    hx   Aneurysm (HCC)    small, on left side of brain    Anxiety    hx of   Arthritis    generalized   Diabetes mellitus without complication (HCC)    diet control, pt denies- on medications   GERD (gastroesophageal  reflux disease)    iwht certain foods/on meds/OTC PRN meds   Hyperlipidemia    on meds   Hypertension    on meds   TIA (transient ischemic attack)    pt unaware of this hx on 06/19/2017    Past Surgical History:  Procedure Laterality Date   ANTERIOR CERVICAL DECOMP/DISCECTOMY FUSION     BACK SURGERY     CARPAL TUNNEL RELEASE Left 2024   CESAREAN SECTION  1986   FOOT SURGERY Left 11/2019   IR GENERIC HISTORICAL  09/21/2014   IR ANGIO INTRA EXTRACRAN SEL INTERNAL CAROTID UNI L MOD SED 09/21/2014 Gerldine Maizes, MD [facility]   IR GENERIC HISTORICAL  09/21/2014   IR ANGIO INTRA EXTRACRAN SEL COM CAROTID INNOMINATE UNI R MOD SED 09/21/2014 Gerldine Maizes, MD [facility]   IR GENERIC HISTORICAL  09/21/2014   IR ANGIO VERTEBRAL SEL VERTEBRAL UNI L MOD SED 09/21/2014 Gerldine Maizes, MD [facility]   IR GENERIC HISTORICAL  09/21/2014   IR 3D INDEPENDENT WKST 09/21/2014 Gerldine Maizes, MD [facility]   LAPAROSCOPIC CHOLECYSTECTOMY     LEFT HEART CATH AND CORONARY ANGIOGRAPHY N/A 06/27/2023   Procedure: LEFT HEART CATH AND CORONARY ANGIOGRAPHY;  Surgeon: Anner Alm ORN, MD;  Location: [facility] INVASIVE CV LAB;  Service: Cardiovascular;  Laterality: N/A;   TUBAL LIGATION     TUBAL LIGATION      Social History   Tobacco Use  Smoking Status Former   Types: Cigarettes  Smokeless Tobacco Never    Social History   Substance and Sexual Activity  Alcohol Use Not Currently   Comment: socailly    Allergies: No Known Allergies  Current Facility-Administered Medications  Medication Dose Route Frequency Provider Last Rate Last Admin   0.9 %  sodium chloride  infusion  250 mL Intravenous PRN Anner Alm ORN, MD       acetaminophen  (TYLENOL ) tablet 650 mg  650 mg Oral Q4H PRN Nahser, Aleene PARAS, MD       amLODipine  (NORVASC ) tablet 10 mg  10 mg Oral Daily Anner Alm ORN, MD   10 mg at 06/28/23 0849   carvedilol  (COREG ) tablet 6.25 mg  6.25 mg Oral BID WC Anner Alm ORN, MD   6.25 mg at 06/28/23 0849   DULoxetine  (CYMBALTA ) DR capsule 30 mg  30 mg Oral Daily Anner Alm ORN, MD   30 mg at 06/28/23 0849   enoxaparin  (LOVENOX ) injection 50 mg  50 mg Subcutaneous Q12H Anner Alm ORN, MD   50 mg at 06/28/23 0849   hydrALAZINE  (APRESOLINE ) injection 10 mg  10 mg Intravenous Q8H PRN Anner Alm ORN, MD   10 mg at 06/27/23 9161   insulin  aspart (novoLOG ) injection 0-9 Units  0-9 Units Subcutaneous TID WC Anner Alm ORN, MD   2 Units at 06/27/23 1646   melatonin tablet 5 mg  5 mg Oral QHS PRN Anner Alm ORN, MD       morphine  (PF) 2 MG/ML injection 2 mg  2 mg Intravenous Q3H PRN Anner Alm ORN, MD   2 mg at 06/24/23 0255   polyethylene glycol (MIRALAX  / GLYCOLAX ) packet 17 g  17 g Oral Daily PRN Anner Alm ORN, MD       rosuvastatin  (CRESTOR ) tablet 40 mg  40 mg Oral Daily  Anner Alm ORN, MD   40 mg at 06/28/23 0849   sacubitril -valsartan  (ENTRESTO ) 97-103 mg per tablet  1 tablet Oral BID Nahser, Aleene PARAS, MD  sodium chloride  flush (NS) 0.9 % injection 3 mL  3 mL Intravenous Q12H Anner Alm ORN, MD   3 mL at 06/28/23 0850   sodium chloride  flush (NS) 0.9 % injection 3 mL  3 mL Intravenous PRN Anner Alm ORN, MD       spironolactone  (ALDACTONE ) tablet 25 mg  25 mg Oral Daily Anner Alm ORN, MD   25 mg at 06/28/23 9150    Medications Prior to Admission  Medication Sig Dispense Refill Last Dose/Taking   amLODipine  (NORVASC ) 10 MG tablet Take 1 tablet (10 mg total) by mouth daily. 90 tablet 1 Past Month   dapagliflozin  propanediol (FARXIGA ) 5 MG TABS tablet Take 1 tablet (5 mg total) by mouth daily. 90 tablet 1 Past Month   dicyclomine  (BENTYL ) 10 MG capsule Take 1 capsule (10 mg total) by mouth 3 (three) times daily before meals. 90 capsule 11 Past Month   DULoxetine  (CYMBALTA ) 30 MG capsule Take 1 capsule (30 mg total) by mouth daily. 180 capsule 1 Past Month   loperamide  (IMODIUM ) 2 MG capsule Take 1 capsule (2 mg total) by mouth 4 (four) times daily as needed for diarrhea or loose stools. 12 capsule 0 Past Month   losartan -hydrochlorothiazide  (HYZAAR ) 100-25 MG tablet Take 1 tablet by mouth daily. 90 tablet 1 Past Month   methocarbamol  (ROBAXIN ) 500 MG tablet Take 1 tablet (500 mg total) by mouth every 8 (eight) hours as needed for muscle spasms. 20 tablet 0 Past Month   metoprolol  succinate (TOPROL -XL) 25 MG 24 hr tablet TAKE ONE TABLET (25MG  TOTAL) BY MOUTH DAILY AT 9AM 90 tablet 1 Past Month   ondansetron  (ZOFRAN ) 4 MG tablet Take 1 tablet (4 mg total) by mouth every 8 (eight) hours as needed for nausea or vomiting. 90 tablet 11 Past Month   ondansetron  (ZOFRAN ) 8 MG tablet Take one prior to each prep dose. 2 tablet 0 Past Month   oxyCODONE -acetaminophen  (PERCOCET/ROXICET) 5-325 MG tablet Take 1 tablet by mouth every 6 (six) hours as needed for  moderate pain or severe pain.   Past Month   pantoprazole  (PROTONIX ) 40 MG tablet TAKE ONE TABLET (40mg  total) BY MOUTH DAILY AT 9AM (Patient taking differently: Take 40 mg by mouth daily as needed (for heartburn).) 30 tablet 0 Past Month   pregabalin (LYRICA) 50 MG capsule Take 50 mg by mouth 3 (three) times daily.   Past Month   rosuvastatin  (CRESTOR ) 40 MG tablet Take 1 tablet (40 mg total) by mouth daily. 90 tablet 1 Past Month   potassium chloride  SA (KLOR-CON  M) 20 MEQ tablet Take 2 tablets (40 mEq total) by mouth daily for 3 days. (Patient not taking: Reported on 06/24/2023) 6 tablet 0 Not Taking   promethazine -dextromethorphan (PROMETHAZINE -DM) 6.25-15 MG/5ML syrup Take 2.5 mLs by mouth 4 (four) times daily as needed for cough. (Patient not taking: Reported on 06/24/2023) 180 mL 0 Not Taking    Family History  Problem Relation Age of Onset   Hypertension Mother    Arthritis Mother    Diabetes Mother    Dementia Mother    Heart attack Father    Heart disease Father    Hypertension Sister    Diabetes Sister    Hypertension Sister    Heart disease Sister    Diabetes Sister    Hypertension Brother    Heart disease Brother    Diabetes Brother    Hypertension Brother    Hypertension Brother    Hypertension Brother  Cancer Neg Hx    Colon cancer Neg Hx    Colon polyps Neg Hx    Esophageal cancer Neg Hx    Rectal cancer Neg Hx    Stomach cancer Neg Hx    Breast cancer Neg Hx    Review of Systems:    Cardiac Review of Systems: Y or  [  N  ]= no  Chest Pain [ on admission   ] Exertional SOB  [ N ]     Pedal Edema [  N ]    Syncope  DISCORDIA.DIESEL  ]     General Review of Systems: [Y] = yes [ N ]=no Constitional:  fatigue [  Y]; nausea [ Y ]; ; fever [ N ]; or chills DISCORDIA.DIESEL  ]                                                               Dental: Last Dentist visit: Has been awhile  Eye : cataracts [Y] Resp: cough [ N ];  wheezing[ N ];  hemoptysis[N  ];  GI:   dysphagia[ N ]; melena[  N];   hematochezia [ N ]; heartburn[Y  ];    GU: hematuria[N  ];                Skin: rash, swelling[ N ];,   peripheral edema[ N ];   Musculosketetal:  joint swelling[ N ];    Heme/Lymph: bruising[  ];  bleeding[  ];  anemia[  ];  Neuro: TIA[Y  ];  difficulty walking[ N ];  Psych:anxiety[Y  ];  Endocrine: diabetes[  Y];        Physical Exam: BP (!) 160/105 (BP Location: Left Arm)   Pulse 74   Temp 98.7 F (37.1 C) (Oral)   Resp 18   Ht 5' 1 (1.549 m)   Wt 48.2 kg   SpO2 100%   BMI 20.08 kg/m    General appearance: alert, cooperative, and no distress Head: Normocephalic, without obvious abnormality, atraumatic Neck: no carotid bruit, no JVD, and supple, symmetrical, trachea midline Resp: clear to auscultation bilaterally Cardio: RRR, no murmur GI: Soft, non tender, bowel sounds present Extremities: No LE edema. Palpable PT bilaterally Neurologic: Grossly normal  Diagnostic Studies & Laboratory data:  Echo done 06/24/2023 IMPRESSIONS     1. Left ventricular ejection fraction, by estimation, is 40 to 45%. The  left ventricle has mildly decreased function. The left ventricle  demonstrates regional wall motion abnormalities (see scoring  diagram/findings for description). There is mild  concentric left ventricular hypertrophy. Left ventricular diastolic  parameters are consistent with Grade I diastolic dysfunction (impaired  relaxation). Elevated left ventricular end-diastolic pressure.   2. Right ventricular systolic function is normal. The right ventricular  size is normal.   3. Left atrial size was severely dilated.   4. The mitral valve is normal in structure. Trivial mitral valve  regurgitation. No evidence of mitral stenosis.   5. The aortic valve is normal in structure. Aortic valve regurgitation is  not visualized. No aortic stenosis is present.   6. The inferior vena cava is normal in size with greater than 50%  respiratory variability, suggesting right atrial  pressure of 3 mmHg.   FINDINGS   Left Ventricle: Left ventricular  ejection fraction, by estimation, is 40  to 45%. The left ventricle has mildly decreased function. The left  ventricle demonstrates regional wall motion abnormalities. The left  ventricular internal cavity size was normal  in size. There is mild concentric left ventricular hypertrophy. Left  ventricular diastolic parameters are consistent with Grade I diastolic  dysfunction (impaired relaxation). Elevated left ventricular end-diastolic  pressure.     LV Wall Scoring:  The entire anterior wall and entire lateral wall are hypokinetic. The  entire  septum, entire inferior wall, and apex are normal.   Right Ventricle: The right ventricular size is normal. No increase in  right ventricular wall thickness. Right ventricular systolic function is  normal.   Left Atrium: Left atrial size was severely dilated.   Right Atrium: Right atrial size was normal in size.   Pericardium: There is no evidence of pericardial effusion.   Mitral Valve: The mitral valve is normal in structure. Trivial mitral  valve regurgitation. No evidence of mitral valve stenosis.   Tricuspid Valve: The tricuspid valve is normal in structure. Tricuspid  valve regurgitation is not demonstrated. No evidence of tricuspid  stenosis.   Aortic Valve: The aortic valve is normal in structure. Aortic valve  regurgitation is not visualized. No aortic stenosis is present. Aortic  valve mean gradient measures 4.0 mmHg. Aortic valve peak gradient measures  7.6 mmHg. Aortic valve area, by VTI  measures 1.78 cm.   Pulmonic Valve: The pulmonic valve was normal in structure. Pulmonic valve  regurgitation is not visualized. No evidence of pulmonic stenosis.   Aorta: The aortic root is normal in size and structure.   Venous: The inferior vena cava is normal in size with greater than 50%  respiratory variability, suggesting right atrial pressure of 3 mmHg.    Recent Radiology Findings:   CARDIAC CATHETERIZATION Result Date: 06/27/2023 Images from the original result were not included. POST-OPERATIVE DIAGNOSIS: Severe multivessel CAD: Dominance: Right Ostial and proximal LAD 80% stenosis with mild diffuse disease elsewhere Ostial and proximal RI 80% stenosis Proximal LCx 70% stenosis with mid to distal 95% stenosis after small OM 2 Mid RCA 80% Moderate reduced EF by echo 40 to 45% with anterior hypokinesis.  Normal LVEDP RECOMMENDATION: Transfer patient to [facility] under the Cardiology service Patient will require CVTS consultation for CABG => Anticipated discharge date to be determined. Will require additional GDMT management of Hypertension, CAD and CHf  Will restart treatment /ACS dose of enoxaparin  With ACS presentation would not be unreasonable to load clopidogrel  300 mg and start 75 mg daily post CABG [name], MD    Normal LVEDP  I have independently reviewed the above radiologic studies and discussed with the patient   Recent Lab Findings: Lab Results  Component Value Date   WBC 12.3 (H) 06/28/2023   HGB 12.7 06/28/2023   HCT 38.7 06/28/2023   PLT 385 06/28/2023   GLUCOSE 107 (H) 06/28/2023   CHOL 268 (H) 05/03/2023   TRIG 142 05/03/2023   HDL 64 05/03/2023   LDLCALC 178 (H) 05/03/2023   ALT 10 05/03/2023   AST 14 05/03/2023   NA 137 06/28/2023   K 3.8 06/28/2023   CL 103 06/28/2023   CREATININE 0.74 06/28/2023   BUN 19 06/28/2023   CO2 26 06/28/2023   TSH 1.900 01/29/2020   INR 0.95 06/19/2017   HGBA1C 6.4 05/02/2023   Assessment / Plan:   Coronary artery disease-multivessel CAD with LVEF 40-45%. Dr. Kerrin to review catheterization films  and determine if candidate for coronary artery bypass grafting surgery. If she is a candidate, first available OR date Wednesday 07/03/2023. 2. Hypertensive urgency-on Amlodipine  10 mg daily, Carvedilol  6.25 mg bid, and Entresto  97/103 bid 3. History of hyperlipidemia-on  Rosuvastatin  40 mg daily 4. History of DM and diabetic neuropathy-will check HGA1C. On Farxiga  prior to admission  I  spent 20 minutes counseling the patient face to face.   Donielle Zimmerman PA-C 06/28/2023 10:18 AM  I have seen and examined Ms. Neyer and reviewed her records and cath images.  68 yo woman with multiple cardiac risk factors who presented with CP.  + troponin.  Cath showed 3 vessel CAD.    CABG indicated for survival benefit and relief of symptoms.  Plan CABG Wed 07/03/2023.  See risk discussion outlined in progress note from 1/6  Ardella Chhim C. Kerrin, MD [facility] [phone]

## 2023-06-29 DIAGNOSIS — I25119 Atherosclerotic heart disease of native coronary artery with unspecified angina pectoris: Secondary | ICD-10-CM | POA: Diagnosis not present

## 2023-06-29 LAB — GLUCOSE, CAPILLARY
Glucose-Capillary: 149 mg/dL — ABNORMAL HIGH (ref 70–99)
Glucose-Capillary: 169 mg/dL — ABNORMAL HIGH (ref 70–99)
Glucose-Capillary: 117 mg/dL — ABNORMAL HIGH (ref 70–99)
Glucose-Capillary: 91 mg/dL (ref 70–99)

## 2023-06-29 LAB — URINALYSIS, ROUTINE W REFLEX MICROSCOPIC
Bilirubin Urine: NEGATIVE
Glucose, UA: NEGATIVE mg/dL
Ketones, ur: NEGATIVE mg/dL
Leukocytes,Ua: NEGATIVE
Nitrite: NEGATIVE
Protein, ur: NEGATIVE mg/dL
Specific Gravity, Urine: 1.018 (ref 1.005–1.030)
pH: 5 (ref 5.0–8.0)

## 2023-06-29 LAB — CBC
HCT: 39.7 % (ref 36.0–46.0)
Hemoglobin: 12.8 g/dL (ref 12.0–15.0)
MCH: 27.6 pg (ref 26.0–34.0)
MCHC: 32.2 g/dL (ref 30.0–36.0)
MCV: 85.7 fL (ref 80.0–100.0)
Platelets: 381 10*3/uL (ref 150–400)
RBC: 4.63 MIL/uL (ref 3.87–5.11)
RDW: 14.2 % (ref 11.5–15.5)
WBC: 10.4 10*3/uL (ref 4.0–10.5)
nRBC: 0 % (ref 0.0–0.2)

## 2023-06-29 LAB — BASIC METABOLIC PANEL
Anion gap: 7 (ref 5–15)
BUN: 14 mg/dL (ref 8–23)
CO2: 27 mmol/L (ref 22–32)
Calcium: 9.1 mg/dL (ref 8.9–10.3)
Chloride: 105 mmol/L (ref 98–111)
Creatinine, Ser: 0.62 mg/dL (ref 0.44–1.00)
GFR, Estimated: >60 mL/min (ref >60)
Glucose, Bld: 170 mg/dL — ABNORMAL HIGH (ref 70–99)
Potassium: 3.8 mmol/L (ref 3.5–5.1)
Sodium: 139 mmol/L (ref 135–145)

## 2023-06-29 NOTE — Progress Notes (Signed)
 Progress Note  Patient Name: [name] Date of Encounter: 06/29/2023  Primary Cardiologist: Aleene Passe, MD  Interval Summary   No chest pain or shortness of breath.  Vital Signs    Vitals:   06/28/23 0805 06/28/23 1713 06/28/23 2040 06/29/23 0742  BP: (!) 160/105 (!) 145/91 139/74 (!) 147/100  Pulse:   77 82  Resp:  18 19 20   Temp:  98.7 F (37.1 C) 98.1 F (36.7 C) 98.1 F (36.7 C)  TempSrc:  Oral Oral Oral  SpO2:   100% 100%  Weight:      Height:        Intake/Output Summary (Last 24 hours) at 06/29/2023 1328 Last data filed at 06/28/2023 2115 Gross per 24 hour  Intake 360 ml  Output --  Net 360 ml   Filed Weights   06/24/23 0126 06/24/23 1307 06/27/23 0604  Weight: 58.2 kg 58.2 kg 48.2 kg    Physical Exam   GEN: No acute distress.   Neck: No JVD. Cardiac: RRR, no murmur, rub, or gallop.  Respiratory: Nonlabored. Clear to auscultation bilaterally. GI: Soft, nontender, bowel sounds present. MS: No edema.  ECG/Telemetry    Telemetry reviewed showing sinus rhythm.  Labs    Chemistry Recent Labs  Lab 06/27/23 1001 06/27/23 1702 06/28/23 0508 06/29/23 0404  NA 135  --  137 139  K 3.8  --  3.8 3.8  CL 105  --  103 105  CO2 23  --  26 27  GLUCOSE 118*  --  107* 170*  BUN 15  --  19 14  CREATININE 0.68 0.69 0.74 0.62  CALCIUM  9.7  --  9.4 9.1  GFRNONAA >60 >60 >60 >60  ANIONGAP 7  --  8 7    Hematology Recent Labs  Lab 06/27/23 1702 06/28/23 0508 06/29/23 0404  WBC 10.9* 12.3* 10.4  RBC 5.01 4.57 4.63  HGB 13.8 12.7 12.8  HCT 42.1 38.7 39.7  MCV 84.0 84.7 85.7  MCH 27.5 27.8 27.6  MCHC 32.8 32.8 32.2  RDW 14.4 14.2 14.2  PLT 402* 385 381   Cardiac Enzymes Recent Labs  Lab 06/23/23 1957 06/23/23 2313 06/24/23 0240  TROPONINIHS 81* 94* 77*   Lipid Panel     Component Value Date/Time   CHOL 268 (H) 05/03/2023 0848   TRIG 142 05/03/2023 0848   HDL 64 05/03/2023 0848   CHOLHDL 4.2 05/03/2023 0848   CHOLHDL 2.7  06/18/2017 1633   VLDL 12 06/18/2017 1633   LDLCALC 178 (H) 05/03/2023 0848   LABVLDL 26 05/03/2023 0848    Cardiac Studies   Echocardiogram 06/24/2023:  1. Left ventricular ejection fraction, by estimation, is 40 to 45%. The  left ventricle has mildly decreased function. The left ventricle  demonstrates regional wall motion abnormalities (see scoring  diagram/findings for description). There is mild  concentric left ventricular hypertrophy. Left ventricular diastolic  parameters are consistent with Grade I diastolic dysfunction (impaired  relaxation). Elevated left ventricular end-diastolic pressure.   2. Right ventricular systolic function is normal. The right ventricular  size is normal.   3. Left atrial size was severely dilated.   4. The mitral valve is normal in structure. Trivial mitral valve  regurgitation. No evidence of mitral stenosis.   5. The aortic valve is normal in structure. Aortic valve regurgitation is  not visualized. No aortic stenosis is present.   6. The inferior vena cava is normal in size with greater than 50%  respiratory variability,  suggesting right atrial pressure of 3 mmHg.   Cardiac catheterization 06/27/2023: Severe multivessel CAD: Dominance: Right Ostial and proximal LAD 80% stenosis with mild diffuse disease elsewhere Ostial and proximal RI 80% stenosis Proximal LCx 70% stenosis with mid to distal 95% stenosis after small OM 2 Mid RCA 80% Moderate reduced EF by echo 40 to 45% with anterior hypokinesis.   Normal LVEDP  Assessment & Plan   1.  Multivessel CAD by cardiac catheterization in January 2.  TCTS evaluating for CABG.  2.  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  3.  Primary hypertension.  Currently on Coreg , Norvasc , Aldactone , and Entresto .  4.  Mixed hyperlipidemia.  Recent LDL 178.  Crestor  40 mg daily initiated.  Currently on Coreg , Norvasc , Entresto , Crestor ,  Aldactone , and Lovenox .  Adding low-dose aspirin .  Anticipate CABG this coming Wednesday.  For questions or updates, please contact [facility] Please consult [email or web address] for contact info under   Signed, Jayson Sierras, MD  06/29/2023, 1:28 PM

## 2023-06-30 DIAGNOSIS — I200 Unstable angina: Secondary | ICD-10-CM

## 2023-06-30 LAB — CBC
HCT: 39.2 % (ref 36.0–46.0)
Hemoglobin: 12.7 g/dL (ref 12.0–15.0)
MCH: 27.7 pg (ref 26.0–34.0)
MCHC: 32.4 g/dL (ref 30.0–36.0)
MCV: 85.6 fL (ref 80.0–100.0)
Platelets: 370 K/uL (ref 150–400)
RBC: 4.58 MIL/uL (ref 3.87–5.11)
RDW: 14 % (ref 11.5–15.5)
WBC: 11.7 K/uL — ABNORMAL HIGH (ref 4.0–10.5)
nRBC: 0 % (ref 0.0–0.2)

## 2023-06-30 LAB — BASIC METABOLIC PANEL WITH GFR
Anion gap: 9 (ref 5–15)
BUN: 14 mg/dL (ref 8–23)
CO2: 25 mmol/L (ref 22–32)
Calcium: 9.5 mg/dL (ref 8.9–10.3)
Chloride: 103 mmol/L (ref 98–111)
Creatinine, Ser: 0.59 mg/dL (ref 0.44–1.00)
GFR, Estimated: >60 mL/min (ref >60)
Glucose, Bld: 173 mg/dL — ABNORMAL HIGH (ref 70–99)
Potassium: 3.8 mmol/L (ref 3.5–5.1)
Sodium: 137 mmol/L (ref 135–145)

## 2023-06-30 LAB — GLUCOSE, CAPILLARY
Glucose-Capillary: 126 mg/dL — ABNORMAL HIGH (ref 70–99)
Glucose-Capillary: 170 mg/dL — ABNORMAL HIGH (ref 70–99)
Glucose-Capillary: 99 mg/dL (ref 70–99)
Glucose-Capillary: 160 mg/dL — ABNORMAL HIGH (ref 70–99)

## 2023-06-30 LAB — SARS CORONAVIRUS 2 (TAT 6-24 HRS): SARS Coronavirus 2: NEGATIVE

## 2023-06-30 MED ORDER — ASPIRIN 81 MG PO TBEC
81.0000 mg | DELAYED_RELEASE_TABLET | Freq: Every day | ORAL | Status: DC
  Administered 2023-06-30 – 2023-07-02 (×3): 81 mg via ORAL
  Filled 2023-06-30 (×3): qty 1

## 2023-06-30 NOTE — Progress Notes (Signed)
 Progress Note  Patient Name: [name] Date of Encounter: 06/30/2023  Primary Cardiologist: Aleene Passe, MD  Interval Summary   Chart reviewed.  Patient remains clinically stable, no recurrent angina or dyspnea on exertion.  She awaits CABG on Wednesday.  Vital Signs    Vitals:   06/29/23 1640 06/29/23 2113 06/30/23 0632 06/30/23 0823  BP: (!) 141/87 (!) 131/91 127/81 (!) 138/99  Pulse: 75 88 90   Resp: 16 19 16 16   Temp: 97.8 F (36.6 C) 97.8 F (36.6 C) 97.8 F (36.6 C) 98.5 F (36.9 C)  TempSrc: Oral Oral Oral Oral  SpO2: 100% 100%    Weight:      Height:        Intake/Output Summary (Last 24 hours) at 06/30/2023 1252 Last data filed at 06/30/2023 0900 Gross per 24 hour  Intake 366 ml  Output --  Net 366 ml   Filed Weights   06/24/23 0126 06/24/23 1307 06/27/23 0604  Weight: 58.2 kg 58.2 kg 48.2 kg    Physical Exam   GEN: No acute distress.   Neck: No JVD. Cardiac: RRR, no murmur, rub, or gallop.  Respiratory: Nonlabored. Clear to auscultation bilaterally. GI: Soft, nontender, bowel sounds present. MS: No edema.  ECG/Telemetry    Telemetry reviewed showing sinus rhythm.  Labs    Chemistry Recent Labs  Lab 06/28/23 0508 06/29/23 0404 06/30/23 0346  NA 137 139 137  K 3.8 3.8 3.8  CL 103 105 103  CO2 26 27 25   GLUCOSE 107* 170* 173*  BUN 19 14 14   CREATININE 0.74 0.62 0.59  CALCIUM  9.4 9.1 9.5  GFRNONAA >60 >60 >60  ANIONGAP 8 7 9     Hematology Recent Labs  Lab 06/28/23 0508 06/29/23 0404 06/30/23 0346  WBC 12.3* 10.4 11.7*  RBC 4.57 4.63 4.58  HGB 12.7 12.8 12.7  HCT 38.7 39.7 39.2  MCV 84.7 85.7 85.6  MCH 27.8 27.6 27.7  MCHC 32.8 32.2 32.4  RDW 14.2 14.2 14.0  PLT 385 381 370   Cardiac Enzymes Recent Labs  Lab 06/23/23 1957 06/23/23 2313 06/24/23 0240  TROPONINIHS 81* 94* 77*   Lipid Panel     Component Value Date/Time   CHOL 268 (H) 05/03/2023 0848   TRIG 142 05/03/2023 0848   HDL 64 05/03/2023 0848    CHOLHDL 4.2 05/03/2023 0848   CHOLHDL 2.7 06/18/2017 1633   VLDL 12 06/18/2017 1633   LDLCALC 178 (H) 05/03/2023 0848   LABVLDL 26 05/03/2023 0848    Cardiac Studies   Echocardiogram 06/24/2023:  1. Left ventricular ejection fraction, by estimation, is 40 to 45%. The  left ventricle has mildly decreased function. The left ventricle  demonstrates regional wall motion abnormalities (see scoring  diagram/findings for description). There is mild  concentric left ventricular hypertrophy. Left ventricular diastolic  parameters are consistent with Grade I diastolic dysfunction (impaired  relaxation). Elevated left ventricular end-diastolic pressure.   2. Right ventricular systolic function is normal. The right ventricular  size is normal.   3. Left atrial size was severely dilated.   4. The mitral valve is normal in structure. Trivial mitral valve  regurgitation. No evidence of mitral stenosis.   5. The aortic valve is normal in structure. Aortic valve regurgitation is  not visualized. No aortic stenosis is present.   6. The inferior vena cava is normal in size with greater than 50%  respiratory variability, suggesting right atrial pressure of 3 mmHg.   Cardiac catheterization 06/27/2023:  Severe multivessel CAD: Dominance: Right Ostial and proximal LAD 80% stenosis with mild diffuse disease elsewhere Ostial and proximal RI 80% stenosis Proximal LCx 70% stenosis with mid to distal 95% stenosis after small OM 2 Mid RCA 80% Moderate reduced EF by echo 40 to 45% with anterior hypokinesis.   Normal LVEDP  Assessment & Plan   1.  Multivessel CAD by cardiac catheterization in January 2.  TCTS evaluating for CABG.  2.  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  3.  Primary hypertension.  Currently on Coreg , Norvasc , Aldactone , and Entresto .  4.  Mixed hyperlipidemia.  Recent LDL 178.  Crestor  40 mg daily initiated.  Continue  aspirin , Coreg , Norvasc , Entresto , Crestor , Aldactone , and Lovenox .  Patient awaits CABG on Wednesday.  For questions or updates, please contact [facility] Please consult [email or web address] for contact info under   Signed, Jayson Sierras, MD  06/30/2023, 12:52 PM

## 2023-07-01 ENCOUNTER — Inpatient Hospital Stay (HOSPITAL_COMMUNITY): Payer: Medicare Other

## 2023-07-01 DIAGNOSIS — I2510 Atherosclerotic heart disease of native coronary artery without angina pectoris: Secondary | ICD-10-CM

## 2023-07-01 DIAGNOSIS — I5022 Chronic systolic (congestive) heart failure: Secondary | ICD-10-CM

## 2023-07-01 DIAGNOSIS — I110 Hypertensive heart disease with heart failure: Secondary | ICD-10-CM

## 2023-07-01 DIAGNOSIS — E7800 Pure hypercholesterolemia, unspecified: Secondary | ICD-10-CM

## 2023-07-01 DIAGNOSIS — Z01810 Encounter for preprocedural cardiovascular examination: Secondary | ICD-10-CM | POA: Diagnosis not present

## 2023-07-01 DIAGNOSIS — R7989 Other specified abnormal findings of blood chemistry: Secondary | ICD-10-CM | POA: Diagnosis not present

## 2023-07-01 LAB — BASIC METABOLIC PANEL
Anion gap: 10 (ref 5–15)
BUN: 15 mg/dL (ref 8–23)
CO2: 26 mmol/L (ref 22–32)
Calcium: 9.6 mg/dL (ref 8.9–10.3)
Chloride: 101 mmol/L (ref 98–111)
Creatinine, Ser: 0.71 mg/dL (ref 0.44–1.00)
GFR, Estimated: >60 mL/min (ref >60)
Glucose, Bld: 170 mg/dL — ABNORMAL HIGH (ref 70–99)
Potassium: 3.8 mmol/L (ref 3.5–5.1)
Sodium: 137 mmol/L (ref 135–145)

## 2023-07-01 LAB — CBC
HCT: 43 % (ref 36.0–46.0)
Hemoglobin: 14 g/dL (ref 12.0–15.0)
MCH: 28 pg (ref 26.0–34.0)
MCHC: 32.6 g/dL (ref 30.0–36.0)
MCV: 86 fL (ref 80.0–100.0)
Platelets: 481 10*3/uL — ABNORMAL HIGH (ref 150–400)
RBC: 5 MIL/uL (ref 3.87–5.11)
RDW: 13.9 % (ref 11.5–15.5)
WBC: 10.5 10*3/uL (ref 4.0–10.5)
nRBC: 0 % (ref 0.0–0.2)

## 2023-07-01 LAB — GLUCOSE, CAPILLARY
Glucose-Capillary: 139 mg/dL — ABNORMAL HIGH (ref 70–99)
Glucose-Capillary: 129 mg/dL — ABNORMAL HIGH (ref 70–99)
Glucose-Capillary: 90 mg/dL (ref 70–99)
Glucose-Capillary: 160 mg/dL — ABNORMAL HIGH (ref 70–99)

## 2023-07-01 LAB — VAS US DOPPLER PRE CABG

## 2023-07-01 MED ORDER — ONDANSETRON HCL 4 MG PO TABS
4.0000 mg | ORAL_TABLET | Freq: Once | ORAL | Status: AC
  Administered 2023-07-01: 4 mg via ORAL
  Filled 2023-07-01: qty 1

## 2023-07-01 MED ORDER — ENOXAPARIN SODIUM 60 MG/0.6ML IJ SOSY
50.0000 mg | PREFILLED_SYRINGE | Freq: Two times a day (BID) | INTRAMUSCULAR | Status: DC
  Administered 2023-07-01 – 2023-07-02 (×2): 50 mg via SUBCUTANEOUS
  Filled 2023-07-01 (×2): qty 0.6

## 2023-07-01 NOTE — TOC Initial Note (Signed)
 Transition of Care [facility]) - Initial/Assessment Note    Patient Details  Name: [name] MRN: [id number] Date of Birth: 1956-03-04  Transition [facility] Care [facility]) CM/SW Contact:    Sudie Erminio Deems, RN Phone Number: 07/01/2023, 1:00 PM  Clinical Narrative:  Patient is a transfer from [facility]. Plan for CABG 07-03-23. Patient reports that she currently lives in  the [facility] and she has a niece that will support post procedure. Patient has insurance and wants to see if she can be screened for supplemental Medicaid. Case Manager did discuss with the CSW regarding having FC screen for possible Medicaid and the patient is agreeable. Niece Royetta is the contact person and her number has been updated in [place] at [phone]. Case Manager will continue to follow for additional transition of care needs as the patient progresses.                   Expected Discharge Plan: Home w Home Health Services Barriers to Discharge: Continued Medical Work up   Patient Goals and CMS Choice Patient states their goals for this hospitalization and ongoing recovery are:: plan to return to motel with support of niece.   Expected Discharge Plan and Services In-house Referral: NA   Post Acute Care Choice: Home Health Living arrangements for the past 2 months: Hotel/Motel    Prior Living Arrangements/Services Living arrangements for the past 2 months: Hotel/Motel Lives with:: Relatives Patient language and need for interpreter reviewed:: Yes Do you feel safe going back to the place where you live?: Yes      Need for Family Participation in Patient Care: Yes (Comment) Care giver support system in place?: Yes (comment)   Criminal Activity/Legal Involvement Pertinent to Current Situation/Hospitalization: No - Comment as needed  Activities of Daily Living   ADL Screening (condition at time of admission) Independently performs ADLs?: Yes (appropriate for developmental age) Is the  patient deaf or have difficulty hearing?: No Does the patient have difficulty seeing, even when wearing glasses/contacts?: No Does the patient have difficulty concentrating, remembering, or making decisions?: No  Permission Sought/Granted Permission sought to share information with : Family Supports, Case Manager   Emotional Assessment Appearance:: Appears stated age Attitude/Demeanor/Rapport: Engaged Affect (typically observed): Appropriate Orientation: : Oriented to Self, Oriented to Place, Oriented to  Time, Oriented to Situation Alcohol / Substance Use: Not Applicable Psych Involvement: No (comment)  Admission diagnosis:  Hypertensive crisis [I16.9] Hypertensive urgency [I16.0] Unstable angina (HCC) [I20.0] Patient Active Problem List   Diagnosis Date Noted   Atherosclerosis of native coronary artery of native heart without angina pectoris 07/01/2023   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) 07/01/2023   Benign hypertension with coincident congestive heart failure (HCC) 07/01/2023   Unstable angina (HCC) 06/28/2023   HFrEF (heart failure with reduced ejection fraction) (HCC) 06/25/2023   Elevated troponin 06/25/2023   Hypertensive crisis 06/24/2023   Hypertensive urgency 06/24/2023   Intractable nausea and vomiting 12/03/2022   Prolonged QT interval 12/03/2022   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   Numbness and tingling of left hand 02/03/2021   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   Sepsis due to undetermined organism (HCC) 10/10/2020   Enteritis of infectious origin 10/09/2020   Diabetes mellitus type 2 in nonobese (HCC) 10/09/2020   Bilateral impacted cerumen 04/02/2019   Conductive hearing loss, bilateral 04/02/2019   Nausea and vomiting 05/17/2018   Enteritis 06/19/2017   Abdominal pain 06/18/2017   Hyperlipidemia 06/18/2017  Elevated glucose 06/18/2017   Need for hepatitis C screening test 05/23/2017   Breast pain, left 01/05/2016    Left ankle sprain 01/20/2015   Gastroenteritis 11/08/2014   Malaise 10/14/2014   Hypokalemia 10/14/2014   Essential hypertension    Bilateral wrist pain 10/06/2014   Hot flashes, menopausal 10/06/2014   Cerebral aneurysm without rupture 08/20/2014   TIA (transient ischemic attack) 08/19/2014   Accelerated hypertension 02/24/2013   Depression 02/24/2013   PCP:  Celestia Rosaline SQUIBB, NP Pharmacy:   [facility] [phone] - [place] - [address] AT [facility] [address] [place] [address] Phone: [phone] Fax: [phone]  [facility] [address] [place] [address] Phone: [phone] Fax: [phone]  [facility] - [place], [name] - [address] [name] [address] Phone: [phone] Fax: [phone]  Social Drivers of Health (SDOH) Social History: SDOH Screenings   Food Insecurity: No Food Insecurity (06/24/2023)  Housing: Low Risk  (06/24/2023)  Transportation Needs: No Transportation Needs (06/24/2023)  Utilities: At Risk (06/24/2023)  Alcohol Screen: Low Risk  (05/02/2023)  Depression (PHQ2-9): Low Risk  (05/02/2023)  Financial Resource Strain: Low Risk  (05/02/2023)  Physical Activity: Inactive (05/02/2023)  Social Connections: Moderately Integrated (06/24/2023)  Recent Concern: Social Connections - Moderately Isolated (05/02/2023)  Stress: Stress Concern Present (05/02/2023)  Tobacco Use: Medium Risk (06/24/2023)  Health Literacy: Adequate Health Literacy (05/02/2023)   SDOH Interventions:     Readmission Risk Interventions     No data to display

## 2023-07-01 NOTE — Progress Notes (Signed)
 CARDIAC REHAB PHASE I      Pre-op OHS education including OHS booklet, OHS handout, IS use, mobility importance, home needs at discharge and sternal precautions/move in the tube reviewed. All questions and concerns addressed. IS today was 1000, encouraged pt to practice. Will continue to follow.  0900-1000 Vaughn Asberry Hacking, RN BSN 07/01/2023 10:04 AM

## 2023-07-01 NOTE — Progress Notes (Signed)
 4 Days Post-Op Procedure(s) (LRB): LEFT HEART CATH AND CORONARY ANGIOGRAPHY (N/A) Subjective: No complaints  Objective: Vital signs in last 24 hours: Temp:  [98 F (36.7 C)-98.2 F (36.8 C)] 98 F (36.7 C) (01/06 1428) Pulse Rate:  [73-75] 73 (01/05 2007) Cardiac Rhythm: Normal sinus rhythm (01/06 0700) Resp:  [16-18] 18 (01/06 1428) BP: (147-152)/(87-88) 147/87 (01/05 2007) SpO2:  [96 %-100 %] 96 % (01/05 2007)  Hemodynamic parameters for last 24 hours:    Intake/Output from previous day: 01/05 0701 - 01/06 0700 In: 246 [P.O.:240; I.V.:6] Out: -  Intake/Output this shift: Total I/O In: 3 [I.V.:3] Out: -   General appearance: alert, cooperative, and no distress Neurologic: intact Heart: regular rate and rhythm Lungs: clear to auscultation bilaterally  Lab Results: Recent Labs    06/30/23 0346 07/01/23 0801  WBC 11.7* 10.5  HGB 12.7 14.0  HCT 39.2 43.0  PLT 370 481*   BMET:  Recent Labs    06/30/23 0346 07/01/23 0801  NA 137 137  K 3.8 3.8  CL 103 101  CO2 25 26  GLUCOSE 173* 170*  BUN 14 15  CREATININE 0.59 0.71  CALCIUM  9.5 9.6    PT/INR: No results for input(s): LABPROT, INR in the last 72 hours. ABG    Component Value Date/Time   HCO3 22.0 12/03/2022 0715   TCO2 22 03/05/2023 1235   ACIDBASEDEF 0.9 12/03/2022 0715   O2SAT 31.2 12/03/2022 0715   CBG (last 3)  Recent Labs    07/01/23 0812 07/01/23 1118 07/01/23 1602  GLUCAP 139* 129* 90    Assessment/Plan: S/P Procedure(s) (LRB): LEFT HEART CATH AND CORONARY ANGIOGRAPHY (N/A) - For CABG Wednesday 1/8  See consult note from 1/3 for full details  I discussed the general nature of the procedure with her including the need for general anesthesia, the incisions to be used, the use of cardiopulmonary bypass and the use of temporary pacemaker wires and drainage tubes postoperatively with Ms. Callanan.  We discussed the indications, risks, benefits and alternatives.  She understands the  risks include, but are not limited to death, MI, DVT, PE, bleeding, need for transfusion, infection, cardiac arrhythmias, respiratory or renal failure as well as the possibility of other unforeseeable complications.  She agrees to proceed.    LOS: 3 days    Elspeth JAYSON Millers 07/01/2023

## 2023-07-01 NOTE — Progress Notes (Addendum)
 Patient agreeable for CSW to reach out to financial counseling to screen her for medicaid. CSW emailed [place] in financial counseling and requested for patient to be screened for medicaid. CSW offered patient [facility] community resources. Patient accepted. All questions answered. No further questions reported at this time.CSW will continue to follow.

## 2023-07-01 NOTE — Progress Notes (Addendum)
 Patient Name: [name] Date of Encounter: 07/01/2023 [facility] Cardiologist: Aleene Passe, MD   Interval Summary  .     No chest pain or shortness of breath.  Had some nausea and vomited last night.  Seems to be anxious and nervous.  No family at the bedside, but reports that she is close to her niece.  Vital Signs .    Vitals:   06/30/23 0823 06/30/23 1301 06/30/23 1648 06/30/23 2007  BP: (!) 138/99 (!) 158/101 (!) 152/88 (!) 147/87  Pulse:  79 75 73  Resp: 16 16 17 16   Temp: 98.5 F (36.9 C) 97.9 F (36.6 C) 98 F (36.7 C) 98.2 F (36.8 C)  TempSrc: Oral Oral Oral Oral  SpO2:  100% 100% 96%  Weight:      Height:        Intake/Output Summary (Last 24 hours) at 07/01/2023 1215 Last data filed at 07/01/2023 1212 Gross per 24 hour  Intake 6 ml  Output --  Net 6 ml      06/27/2023    6:04 AM 06/24/2023    1:07 PM 06/24/2023    1:26 AM  Last 3 Weights  Weight (lbs) 106 lb 4.2 oz 128 lb 4.8 oz 128 lb 4.8 oz  Weight (kg) 48.2 kg 58.196 kg 58.196 kg      Telemetry/ECG    Sinus rhythm heart rates in the 80s- Personally Reviewed  CV Studies    Left heart catheterization 06/27/2023 POST-OPERATIVE DIAGNOSIS: Severe multivessel CAD: Dominance: Right Ostial and proximal LAD 80% stenosis with mild diffuse disease elsewhere Ostial and proximal RI 80% stenosis Proximal LCx 70% stenosis with mid to distal 95% stenosis after small OM 2 Mid RCA 80% Moderate reduced EF by echo 40 to 45% with anterior hypokinesis.   Normal LVEDP    RECOMMENDATION: Transfer patient to [facility] under the Cardiology service  Patient will require CVTS consultation for CABG => Anticipated discharge date to be determined. Will require additional GDMT management of Hypertension, CAD and CHf   Will restart treatment /ACS dose of enoxaparin   With ACS presentation would not be unreasonable to load clopidogrel  300 mg and start 75 mg daily post CABG    Echocardiogram  06/24/2023  1. Left ventricular ejection fraction, by estimation, is 40 to 45%. The  left ventricle has mildly decreased function. The left ventricle  demonstrates regional wall motion abnormalities (see scoring  diagram/findings for description). There is mild  concentric left ventricular hypertrophy. Left ventricular diastolic  parameters are consistent with Grade I diastolic dysfunction (impaired  relaxation). Elevated left ventricular end-diastolic pressure.   2. Right ventricular systolic function is normal. The right ventricular  size is normal.   3. Left atrial size was severely dilated.   4. The mitral valve is normal in structure. Trivial mitral valve  regurgitation. No evidence of mitral stenosis.   5. The aortic valve is normal in structure. Aortic valve regurgitation is  not visualized. No aortic stenosis is present.   6. The inferior vena cava is normal in size with greater than 50%  respiratory variability, suggesting right atrial pressure of 3 mmHg.     Physical Exam .   GEN: No acute distress.   Neck: No JVD Cardiac: RRR, no murmurs, rubs, or gallops.  Respiratory: Clear to auscultation bilaterally. GI: Soft, nontender, non-distended  MS: No edema  Patient Profile    [name] is a 68 y.o. female has hx of hypertension, diabetes,  GERD, hyperlipidemia, TIA, small brain aneurysm and admitted on 06/24/2023 the evaluation of hypertensive urgency and chest pain.  Had a father who died of a MI.  She has no immediate family but is close to her niece.  Assessment & Plan .     CAD with MVD Initially had mild symptoms of positional nonexertional, chest pain occurring at night generally when she is laying flat. EKG lateral T wave inversions, troponin peaked at 97 and trended down.  Cardiac catheterization showed diffuse multivessel disease, Awaiting CABG. Continue aspirin , carvedilol  6.25 mg twice daily, rosuvastatin  40 mg Normal LP(a)  New onset heart failure  with mildly reduced EF Asymptomatic and well compensated.  Echo here shows EF 40 to 45% with RWMA and mild concentric LVH and normal RV function.  Severely dilated LA. she is tolerating GDMT well Currently on carvedilol  6.25 mg twice daily, Entresto  97-23 mg twice daily, spironolactone  25 mg.  Likely will start SGLT2 inhibitor at discharge  Hypertensive urgency Reports being out of her blood pressure medications for 3 months secondary to being out of work due to injury as well as significant life stressors however reports she is able to afford her medications now.  BP much improved now in the 140s to 150s  Continue the above along with amlodipine  10 mg okay to continue this for now however if it interferes with titration of GDMT discontinue.  Hyperlipidemia LDL 178.  Needs better control, currently on rosuvastatin  40 mg.  Consider PCSK9 inhibitor if unable to control.  No labs for this morning, ordering  For questions or updates, please contact [facility] Please consult [email or web address] for contact info under        Signed, Thom LITTIE Sluder, PA-C    ADDENDUM:   Patient seen and examined with APP, Thom LITTIE Sluder, PA-C.  I personally taken a history, examined the patient, reviewed relevant notes,  laboratory data / imaging studies.  I performed a substantive portion of this encounter and formulated the important aspects of the plan.  I agree with the APP's note, impression, and recommendations; however, I have edited the note to reflect changes or salient points.  Denies anginal chest pain. Awaiting CABG.  PHYSICAL EXAM: Today's Vitals   06/30/23 2007 07/01/23 0053 07/01/23 0138 07/01/23 0819  BP: (!) 147/87     Pulse: 73     Resp: 16     Temp: 98.2 F (36.8 C)     TempSrc: Oral     SpO2: 96%     Weight:      Height:      PainSc:  3  Asleep 0-No pain   Body mass index is 20.08 kg/m.   Net IO Since Admission: 3,523.79 mL [07/01/23 1215]  Filed Weights   06/24/23  0126 06/24/23 1307 06/27/23 0604  Weight: 58.2 kg 58.2 kg 48.2 kg    General: Age-appropriate female, hemodynamically stable, no acute distress. HEENT: Normocephalic, atraumatic, trachea midline, mucous membranes moist. Cardiac: Regular rate and rhythm, no obvious murmurs rubs or gallops appreciated. Lungs: Clear to auscultation bilaterally, no wheezes rales or rhonchi's. GI: Soft, nontender, nondistended, positive bowel sounds in all 4 quadrants. Extremities: No pitting edema, warm to touch, no clubbing or cyanosis.  EKG: (personally reviewed by me) No new EKGs over 24 hours.  Telemetry: (personally reviewed by me) Sinus rhythm without ectopy   Impression:  CAD with multivessel disease-awaiting CABG. Heart failure with mildly reduced LVEF, compensated Hypertension with heart failure. Hyperlipidemia.   Recommendations:  Clinically  denies anginal chest pain. Currently being worked up for coronary artery bypass grafting surgery later this week. Duplexes are scheduled later today. Continue aspirin  81 mg p.o. daily. Continue carvedilol  6.25 mg p.o. daily. Continue amlodipine  10 mg p.o. daily. Continue Crestor  40 mg p.o. daily. Continue Entresto  97/103 mg p.o. twice daily. Continue spironolactone  25 mg p.o. daily. No family present at bedside-close to her niece.  Further recommendations to follow as the case evolves.   This note was created using a voice recognition software as a result there may be grammatical errors inadvertently enclosed that do not reflect the nature of this encounter. Every attempt is made to correct such errors.   Madonna Large, DO, [facility]  [address] [place], [place] [address] Pager: [phone] Office: [phone] 07/01/2023 12:15 PM

## 2023-07-01 NOTE — Progress Notes (Signed)
 VASCULAR LAB   Pre CABG Dopplers have been completed  See CV proc for preliminary results.   Tamaria Dunleavy, RVT 07/01/2023, 3:47 PM

## 2023-07-02 ENCOUNTER — Encounter (HOSPITAL_COMMUNITY): Payer: Self-pay | Admitting: Cardiovascular Disease

## 2023-07-02 ENCOUNTER — Inpatient Hospital Stay (HOSPITAL_COMMUNITY): Payer: Medicare Other

## 2023-07-02 DIAGNOSIS — I2510 Atherosclerotic heart disease of native coronary artery without angina pectoris: Secondary | ICD-10-CM | POA: Diagnosis not present

## 2023-07-02 DIAGNOSIS — R7989 Other specified abnormal findings of blood chemistry: Secondary | ICD-10-CM | POA: Diagnosis not present

## 2023-07-02 DIAGNOSIS — I5022 Chronic systolic (congestive) heart failure: Secondary | ICD-10-CM | POA: Diagnosis not present

## 2023-07-02 DIAGNOSIS — I110 Hypertensive heart disease with heart failure: Secondary | ICD-10-CM | POA: Diagnosis not present

## 2023-07-02 LAB — BLOOD GAS, ARTERIAL
Acid-Base Excess: 0.3 mmol/L (ref 0.0–2.0)
Bicarbonate: 23.3 mmol/L (ref 20.0–28.0)
Drawn by: 441371
O2 Saturation: 99.2 %
Patient temperature: 36.8
pCO2 arterial: 32 mm[Hg] (ref 32–48)
pH, Arterial: 7.47 — ABNORMAL HIGH (ref 7.35–7.45)
pO2, Arterial: 96 mm[Hg] (ref 83–108)

## 2023-07-02 LAB — CBC
HCT: 38.7 % (ref 36.0–46.0)
Hemoglobin: 12.7 g/dL (ref 12.0–15.0)
MCH: 27.7 pg (ref 26.0–34.0)
MCHC: 32.8 g/dL (ref 30.0–36.0)
MCV: 84.5 fL (ref 80.0–100.0)
Platelets: 399 10*3/uL (ref 150–400)
RBC: 4.58 MIL/uL (ref 3.87–5.11)
RDW: 13.9 % (ref 11.5–15.5)
WBC: 10.7 10*3/uL — ABNORMAL HIGH (ref 4.0–10.5)
nRBC: 0 % (ref 0.0–0.2)

## 2023-07-02 LAB — BASIC METABOLIC PANEL
Anion gap: 9 (ref 5–15)
BUN: 12 mg/dL (ref 8–23)
CO2: 27 mmol/L (ref 22–32)
Calcium: 9.7 mg/dL (ref 8.9–10.3)
Chloride: 103 mmol/L (ref 98–111)
Creatinine, Ser: 0.69 mg/dL (ref 0.44–1.00)
GFR, Estimated: >60 mL/min (ref >60)
Glucose, Bld: 165 mg/dL — ABNORMAL HIGH (ref 70–99)
Potassium: 3.9 mmol/L (ref 3.5–5.1)
Sodium: 139 mmol/L (ref 135–145)

## 2023-07-02 LAB — GLUCOSE, CAPILLARY
Glucose-Capillary: 147 mg/dL — ABNORMAL HIGH (ref 70–99)
Glucose-Capillary: 154 mg/dL — ABNORMAL HIGH (ref 70–99)
Glucose-Capillary: 99 mg/dL (ref 70–99)
Glucose-Capillary: 121 mg/dL — ABNORMAL HIGH (ref 70–99)

## 2023-07-02 LAB — ABO/RH: ABO/RH(D): O NEG

## 2023-07-02 MED ORDER — CHLORHEXIDINE GLUCONATE 0.12 % MT SOLN
15.0000 mL | Freq: Once | OROMUCOSAL | Status: AC
  Administered 2023-07-03: 15 mL via OROMUCOSAL
  Filled 2023-07-02: qty 15

## 2023-07-02 MED ORDER — CEFAZOLIN SODIUM-DEXTROSE 2-4 GM/100ML-% IV SOLN
2.0000 g | INTRAVENOUS | Status: DC
  Filled 2023-07-02: qty 100

## 2023-07-02 MED ORDER — INSULIN REGULAR(HUMAN) IN NACL 100-0.9 UT/100ML-% IV SOLN
INTRAVENOUS | Status: DC
  Filled 2023-07-02: qty 100

## 2023-07-02 MED ORDER — CHLORHEXIDINE GLUCONATE CLOTH 2 % EX PADS: 6.0000 | MEDICATED_PAD | Freq: Once | CUTANEOUS | Status: DC

## 2023-07-02 MED ORDER — OXYCODONE HCL 5 MG PO TABS
5.0000 mg | ORAL_TABLET | Freq: Once | ORAL | Status: AC
  Administered 2023-07-02: 5 mg via ORAL
  Filled 2023-07-02: qty 1

## 2023-07-02 MED ORDER — NOREPINEPHRINE 4 MG/250ML-% IV SOLN
0.0000 ug/min | INTRAVENOUS | Status: DC
  Filled 2023-07-02: qty 250

## 2023-07-02 MED ORDER — CHLORHEXIDINE GLUCONATE CLOTH 2 % EX PADS
6.0000 | MEDICATED_PAD | Freq: Once | CUTANEOUS | Status: AC
Start: 2023-07-02 — End: 2023-07-02
  Administered 2023-07-02: 6 via TOPICAL

## 2023-07-02 MED ORDER — DIAZEPAM 2 MG PO TABS
2.0000 mg | ORAL_TABLET | Freq: Once | ORAL | Status: AC
Start: 2023-07-03 — End: 2023-07-03
  Administered 2023-07-03: 2 mg via ORAL
  Filled 2023-07-02: qty 1

## 2023-07-02 MED ORDER — DEXMEDETOMIDINE HCL IN NACL 400 MCG/100ML IV SOLN
0.1000 ug/kg/h | INTRAVENOUS | Status: DC
  Filled 2023-07-02: qty 100

## 2023-07-02 MED ORDER — CEFAZOLIN SODIUM-DEXTROSE 2-4 GM/100ML-% IV SOLN
2.0000 g | INTRAVENOUS | Status: AC
  Administered 2023-07-03 (×2): 2 g via INTRAVENOUS
  Filled 2023-07-02: qty 100

## 2023-07-02 MED ORDER — PLASMA-LYTE A IV SOLN
INTRAVENOUS | Status: DC
  Filled 2023-07-02: qty 5

## 2023-07-02 MED ORDER — METOPROLOL TARTRATE 12.5 MG HALF TABLET
12.5000 mg | ORAL_TABLET | Freq: Once | ORAL | Status: AC
  Administered 2023-07-03: 12.5 mg via ORAL
  Filled 2023-07-02: qty 1

## 2023-07-02 MED ORDER — TRANEXAMIC ACID 1000 MG/10ML IV SOLN
1.5000 mg/kg/h | INTRAVENOUS | Status: AC
  Administered 2023-07-03: 1.5 mg/kg/h via INTRAVENOUS
  Filled 2023-07-02: qty 25

## 2023-07-02 MED ORDER — PHENYLEPHRINE HCL-NACL 20-0.9 MG/250ML-% IV SOLN
30.0000 ug/min | INTRAVENOUS | Status: DC
  Filled 2023-07-02: qty 250

## 2023-07-02 MED ORDER — VANCOMYCIN HCL IN DEXTROSE 1-5 GM/200ML-% IV SOLN
1000.0000 mg | INTRAVENOUS | Status: AC
  Administered 2023-07-03: 1000 mg via INTRAVENOUS
  Filled 2023-07-02: qty 200

## 2023-07-02 MED ORDER — TRANEXAMIC ACID (OHS) PUMP PRIME SOLUTION
2.0000 mg/kg | INTRAVENOUS | Status: DC
  Filled 2023-07-02: qty 0.96

## 2023-07-02 MED ORDER — TRANEXAMIC ACID (OHS) BOLUS VIA INFUSION
15.0000 mg/kg | INTRAVENOUS | Status: AC
  Administered 2023-07-03: 723 mg via INTRAVENOUS
  Filled 2023-07-02: qty 723

## 2023-07-02 MED ORDER — EPINEPHRINE HCL 5 MG/250ML IV SOLN IN NS
0.0000 ug/min | INTRAVENOUS | Status: DC
  Filled 2023-07-02: qty 250

## 2023-07-02 MED ORDER — MILRINONE LACTATE IN DEXTROSE 20-5 MG/100ML-% IV SOLN
0.3000 ug/kg/min | INTRAVENOUS | Status: DC
  Filled 2023-07-02: qty 100

## 2023-07-02 MED ORDER — BISACODYL 5 MG PO TBEC
5.0000 mg | DELAYED_RELEASE_TABLET | Freq: Once | ORAL | Status: AC
  Administered 2023-07-02: 5 mg via ORAL
  Filled 2023-07-02: qty 1

## 2023-07-02 MED ORDER — HEPARIN 30,000 UNITS/1000 ML (OHS) CELLSAVER SOLUTION
Status: DC
  Filled 2023-07-02: qty 1000

## 2023-07-02 MED ORDER — MAGNESIUM SULFATE 50 % IJ SOLN
40.0000 meq | INTRAMUSCULAR | Status: DC
  Filled 2023-07-02: qty 9.85

## 2023-07-02 MED ORDER — NITROGLYCERIN IN D5W 200-5 MCG/ML-% IV SOLN
2.0000 ug/min | INTRAVENOUS | Status: DC
  Filled 2023-07-02: qty 250

## 2023-07-02 MED ORDER — POTASSIUM CHLORIDE 2 MEQ/ML IV SOLN
80.0000 meq | INTRAVENOUS | Status: DC
  Filled 2023-07-02: qty 40

## 2023-07-02 NOTE — TOC Progression Note (Signed)
 Transition of Care [facility]) - Progression Note    Patient Details  Name: [name] MRN: [id number] Date of Birth: 1956/04/27  [facility]) CM/SW Contact  Graves-Bigelow, Erminio Deems, RN Phone Number: 07/02/2023, 4:04 PM  Clinical Narrative: Case Manager received a call from [facility] with [facility] @ [phone]. [facility] is trying to see if they can service the patient post CABG. Verneita states that the patient's [facility] Insurance has termed and the office is trying to contact the new plan. Case Manager received the Medicare # [id number], ID # is [id number]. Telephone # is [phone]. Case Manager has sent a secure chat to Pharmacists to see if they can locate this information. Case Manager will continue to follow.    Expected Discharge Plan: Home w Home Health Services Barriers to Discharge: Continued Medical Work up  Expected Discharge Plan and Services In-house Referral: NA   Post Acute Care Choice: Home Health Living arrangements for the past 2 months: Hotel/Motel  Social Determinants of Health (SDOH) Interventions SDOH Screenings   Food Insecurity: No Food Insecurity (06/24/2023)  Housing: Low Risk  (06/24/2023)  Transportation Needs: No Transportation Needs (06/24/2023)  Utilities: At Risk (06/24/2023)  Alcohol Screen: Low Risk  (05/02/2023)  Depression (PHQ2-9): Low Risk  (05/02/2023)  Financial Resource Strain: Low Risk  (05/02/2023)  Physical Activity: Inactive (05/02/2023)  Social Connections: Moderately Integrated (06/24/2023)  Recent Concern: Social Connections - Moderately Isolated (05/02/2023)  Stress: Stress Concern Present (05/02/2023)  Tobacco Use: Medium Risk (07/02/2023)  Health Literacy: Adequate Health Literacy (05/02/2023)   Readmission Risk Interventions     No data to display

## 2023-07-02 NOTE — Progress Notes (Addendum)
 Patient Name: [name] Date of Encounter: 07/02/2023 [facility] Cardiologist: Aleene Passe, MD   Interval Summary  .    Patient resting comfortably in bed this morning. Admits to nerves about surgery tomorrow but is at peace about it. She has no focal complaints today, no chest pain or dyspnea.   Vital Signs .    Vitals:   07/01/23 1753 07/01/23 2018 07/02/23 0614 07/02/23 0826  BP: (!) 144/96 (!) 138/90 137/87 (!) 152/100  Pulse:  88 74 82  Resp:  18 17   Temp:  98.3 F (36.8 C) 98.3 F (36.8 C)   TempSrc:  Oral Oral   SpO2:  100% 98%   Weight:      Height:        Intake/Output Summary (Last 24 hours) at 07/02/2023 0841 Last data filed at 07/01/2023 2152 Gross per 24 hour  Intake 6 ml  Output --  Net 6 ml      06/27/2023    6:04 AM 06/24/2023    1:07 PM 06/24/2023    1:26 AM  Last 3 Weights  Weight (lbs) 106 lb 4.2 oz 128 lb 4.8 oz 128 lb 4.8 oz  Weight (kg) 48.2 kg 58.196 kg 58.196 kg      Telemetry/ECG    Sinus rhythm with intermittent tachycardia associated with ambulation - Personally Reviewed  Physical Exam .   GEN: No acute distress.   Neck: No JVD Cardiac: RRR, no murmurs, rubs, or gallops.  Respiratory: Clear to auscultation bilaterally. GI: Soft, nontender, non-distended  MS: No edema  Assessment & Plan .     [name] is a 68 y.o. female has hx of hypertension, diabetes, GERD, hyperlipidemia, TIA, small brain aneurysm and admitted on 06/24/2023 the evaluation of hypertensive urgency and chest pain.   CAD with multi-vessel disease  Patient admitted with positional/nonexertional chest pain occurring at night. Admission EKG with lateral TWI. Mildly elevated troponin 81->94->77. Given symptoms and troponin, she underwent LHC on 1/2 with Dr. Anner that found severe multi-vessel CAD. Ostial and proximal LAD 80% stenosis with mild diffuse disease elsewhere. Ostial and proximal RI 80% stenosis. Proximal LCx 70% stenosis with  mid to distal 95% stenosis after small OM 2. Mid RCA 80%. Patient now pending CABG with Dr. Kerrin on 07/02/22.  Continue ASA Continue Coreg  6.25mg  BID Continue Crestor  40mg   HFmrEF  This admission TTE noted LVEF 40-45% with anterior wall and lateral wall hypokinesis. Mild concentric LVH and severe LA dilation also noted.   Continue Coreg  6.25mg  BID Continue Entresto  97-103mg  BID Continue Spironolactone  25mg  every day Plan for SGLT2 after CABG  Hypertensive urgency  Patient with notable hypertension on admission, had been without BP meds for at least 3 months.   BP much improved, at goal->slightly elevated since establishing GDMT. Continue HF GDMT as above along with Amlodipine  10mg . Could consider increasing Spironlactone to 50mg  if further medication titration needed after CABG.   Hyperlipidemia  LDL 178 this admission. Now on Crestor  40mg . Will need repeat lipid panel and LFTs in follow up. Would be a strong candidate for PCSK9.   DM type II  Continue SSI.   For questions or updates, please contact [facility] Please consult [email or web address] for contact info under        Signed, Artist Pouch, PA-C   ADDENDUM:   Patient seen and examined with APP, Artist Pouch.  I personally taken a history, examined the patient, reviewed relevant notes,  laboratory data /  imaging studies.  I performed a substantive portion of this encounter and formulated the important aspects of the plan.  I agree with the APP's note, impression, and recommendations; however, I have edited the note to reflect changes or salient points.  Patient seen and examined at bedside. Denies anginal chest pain or heart failure symptoms. Tentatively scheduled for bypass surgery tomorrow 07/03/2023  PHYSICAL EXAM: Today's Vitals   07/01/23 2100 07/02/23 0614 07/02/23 0826 07/02/23 1026  BP:  137/87 (!) 152/100   Pulse:  74 82   Resp:  17    Temp:  98.3 F (36.8 C)    TempSrc:  Oral    SpO2:   98%    Weight:      Height:      PainSc: 0-No pain  0-No pain 3    Body mass index is 20.08 kg/m.   Net IO Since Admission: 4,366.79 mL [07/02/23 1126]  Filed Weights   06/24/23 0126 06/24/23 1307 06/27/23 0604  Weight: 58.2 kg 58.2 kg 48.2 kg    Physical Exam  Constitutional: No distress.  hemodynamically stable  Neck: No JVD present.  Cardiovascular: Normal rate, regular rhythm, S1 normal and S2 normal. Exam reveals no gallop, no S3 and no S4.  No murmur heard. Pulmonary/Chest: Effort normal and breath sounds normal. No stridor. She has no wheezes. She has no rales.  Abdominal: Soft. Bowel sounds are normal. She exhibits no distension. There is no abdominal tenderness.  Musculoskeletal:        General: No edema.     Cervical back: Neck supple.  Neurological: She is alert and oriented to person, place, and time. She has intact cranial nerves (2-12).  Skin: Skin is warm.    EKG: (personally reviewed by me) No new EKGs.  Telemetry: (personally reviewed by me) Sinus rhythm without ectopy  Pre-CABG vascular ultrasounds: 07/01/2023 Right Carotid: The extracranial vessels were near-normal with only minimal wall  thickening or plaque.   Left Carotid: The extracranial vessels were near-normal with only minimal wall  thickening or plaque.  Vertebrals:  Bilateral vertebral arteries demonstrate antegrade flow.  Subclavians: Normal flow hemodynamics were seen in bilateral subclavian  arteries.   Right ABI: Resting right ankle-brachial index is within normal range. The right toe-brachial index is abnormal.  Left ABI: Resting left ankle-brachial index is within normal range. The left toe-brachial index is normal.  Right Upper Extremity: Doppler waveforms remain within normal limits with right radial compression. Doppler waveforms remain within normal limits  with right ulnar compression.  Left Upper Extremity: Doppler waveforms remain within normal limits with  left radial  compression. Doppler waveform obliterate with left ulnar compression.    Impression:  Multivessel CAD-awaiting CABG Heart failure with mildly reduced LVEF, Hypertension with heart failure Hyperlipidemia  Recommendations:  Multivessel CAD: Tentatively scheduled for surgery tomorrow 07/03/2023. Currently no anginal chest pain. Telemetry notes sinus rhythm without ectopy or sustained arrhythmias. Medications reconciled. Pre-CABG ultrasound results reviewed, near normal bilateral carotid arteries, ankle-brachial indexes are normal bilaterally, and upper extremity waveforms remained stable with radial compressions. Patient has no additional questions or concerns.  Heart failure with mildly reduced LVEF: Clinically compensated. Medications reconciled.  Hypertension: Improving.  Meds reconciled.  Hyperlipidemia: Continue statin therapy.  Further recommendations to follow as the case evolves.   This note was created using a voice recognition software as a result there may be grammatical errors inadvertently enclosed that do not reflect the nature of this encounter. Every attempt is made to correct such  errors.   Madonna Large, DO, [facility]  [address] [place], [place] [address] Pager: [phone] Office: [phone] 07/02/2023 11:26 AM

## 2023-07-02 NOTE — Anesthesia Preprocedure Evaluation (Addendum)
 Anesthesia Evaluation  Patient identified by MRN, date of birth, ID band Patient awake    Reviewed: Allergy & Precautions, NPO status , Patient's Chart, lab work & pertinent test results, reviewed documented beta blocker date and time   History of Anesthesia Complications Negative for: history of anesthetic complications  Airway Mallampati: II  TM Distance: >3 FB Neck ROM: Full    Dental  (+) Dental Advisory Given   Pulmonary former smoker   Pulmonary exam normal        Cardiovascular hypertension, Pt. on medications and Pt. on home beta blockers + CAD and +CHF  Normal cardiovascular exam   '25 Cath -  Ostial and proximal LAD 80% stenosis with mild diffuse disease elsewhere  Ostial and proximal RI 80% stenosis  Proximal LCx 70% stenosis with mid to distal 95% stenosis after small OM 2  Mid RCA 80%  '24 TTE -  EF 40 to 45%. The entire anterior wall and entire lateral wall are hypokinetic. There is mild concentric left ventricular hypertrophy. Grade I diastolic dysfunction (impaired relaxation). Severely dilated LA. Trivial MR.      Neuro/Psych  PSYCHIATRIC DISORDERS Anxiety Depression    TIA   GI/Hepatic Neg liver ROS,GERD  Medicated and Controlled,,  Endo/Other  diabetes, Well Controlled, Type 2    Renal/GU negative Renal ROS     Musculoskeletal  (+) Arthritis ,    Abdominal   Peds  Hematology negative hematology ROS (+)   Anesthesia Other Findings   Reproductive/Obstetrics                             Anesthesia Physical Anesthesia Plan  ASA: 4  Anesthesia Plan: General   Post-op Pain Management:    Induction: Intravenous  PONV Risk Score and Plan: 3 and Treatment may vary due to age or medical condition, Ondansetron  and Dexamethasone  Airway Management Planned: Oral ETT  Additional Equipment: Arterial line, CVP, PA Cath, TEE and Ultrasound Guidance Line  Placement  Intra-op Plan:   Post-operative Plan: Post-operative intubation/ventilation  Informed Consent: I have reviewed the patients History and Physical, chart, labs and discussed the procedure including the risks, benefits and alternatives for the proposed anesthesia with the patient or authorized representative who has indicated his/her understanding and acceptance.     Dental advisory given  Plan Discussed with: CRNA and Anesthesiologist  Anesthesia Plan Comments:         Anesthesia Quick Evaluation

## 2023-07-03 ENCOUNTER — Encounter (HOSPITAL_COMMUNITY): Payer: Self-pay | Admitting: Cardiovascular Disease

## 2023-07-03 ENCOUNTER — Inpatient Hospital Stay (HOSPITAL_COMMUNITY)
Admission: EM | Disposition: A | Discharge status: Home Health | Payer: Self-pay | Source: Home / Self Care | Attending: Cardiovascular Disease

## 2023-07-03 ENCOUNTER — Other Ambulatory Visit (HOSPITAL_COMMUNITY): Payer: Self-pay

## 2023-07-03 ENCOUNTER — Other Ambulatory Visit: Payer: Self-pay

## 2023-07-03 ENCOUNTER — Inpatient Hospital Stay (HOSPITAL_COMMUNITY): Payer: Medicare Other

## 2023-07-03 DIAGNOSIS — I110 Hypertensive heart disease with heart failure: Secondary | ICD-10-CM

## 2023-07-03 DIAGNOSIS — I2510 Atherosclerotic heart disease of native coronary artery without angina pectoris: Secondary | ICD-10-CM

## 2023-07-03 DIAGNOSIS — I509 Heart failure, unspecified: Secondary | ICD-10-CM

## 2023-07-03 DIAGNOSIS — E119 Type 2 diabetes mellitus without complications: Secondary | ICD-10-CM

## 2023-07-03 HISTORY — PX: CORONARY ARTERY BYPASS GRAFT: SHX141

## 2023-07-03 HISTORY — PX: TEE WITHOUT CARDIOVERSION: SHX5443

## 2023-07-03 LAB — CBC
HCT: 28.2 % — ABNORMAL LOW (ref 36.0–46.0)
HCT: 30.5 % — ABNORMAL LOW (ref 36.0–46.0)
HCT: 40.4 % (ref 36.0–46.0)
HCT: 40.8 % (ref 36.0–46.0)
Hemoglobin: 10.2 g/dL — ABNORMAL LOW (ref 12.0–15.0)
Hemoglobin: 13.1 g/dL (ref 12.0–15.0)
Hemoglobin: 13.2 g/dL (ref 12.0–15.0)
Hemoglobin: 9.3 g/dL — ABNORMAL LOW (ref 12.0–15.0)
MCH: 27.2 pg (ref 26.0–34.0)
MCH: 27.3 pg (ref 26.0–34.0)
MCH: 28.3 pg (ref 26.0–34.0)
MCH: 28.4 pg (ref 26.0–34.0)
MCHC: 32.1 g/dL (ref 30.0–36.0)
MCHC: 32.7 g/dL (ref 30.0–36.0)
MCHC: 33 g/dL (ref 30.0–36.0)
MCHC: 33.4 g/dL (ref 30.0–36.0)
MCV: 83.6 fL (ref 80.0–100.0)
MCV: 84.6 fL (ref 80.0–100.0)
MCV: 84.7 fL (ref 80.0–100.0)
MCV: 86.2 fL (ref 80.0–100.0)
Platelets: 163 10*3/uL (ref 150–400)
Platelets: 208 10*3/uL (ref 150–400)
Platelets: 387 10*3/uL (ref 150–400)
Platelets: 397 10*3/uL (ref 150–400)
RBC: 3.27 MIL/uL — ABNORMAL LOW (ref 3.87–5.11)
RBC: 3.6 MIL/uL — ABNORMAL LOW (ref 3.87–5.11)
RBC: 4.82 MIL/uL (ref 3.87–5.11)
RBC: 4.83 MIL/uL (ref 3.87–5.11)
RDW: 13.6 % (ref 11.5–15.5)
RDW: 13.7 % (ref 11.5–15.5)
RDW: 14.3 % (ref 11.5–15.5)
RDW: 14.4 % (ref 11.5–15.5)
WBC: 10.4 10*3/uL (ref 4.0–10.5)
WBC: 14.1 10*3/uL — ABNORMAL HIGH (ref 4.0–10.5)
WBC: 14.3 10*3/uL — ABNORMAL HIGH (ref 4.0–10.5)
WBC: 14.5 10*3/uL — ABNORMAL HIGH (ref 4.0–10.5)
nRBC: 0 % (ref 0.0–0.2)
nRBC: 0 % (ref 0.0–0.2)
nRBC: 0 % (ref 0.0–0.2)
nRBC: 0 % (ref 0.0–0.2)

## 2023-07-03 LAB — POCT I-STAT 7, (LYTES, BLD GAS, ICA,H+H)
Acid-Base Excess: 1 mmol/L (ref 0.0–2.0)
Acid-Base Excess: 2 mmol/L (ref 0.0–2.0)
Acid-Base Excess: 1 mmol/L (ref 0.0–2.0)
Acid-base deficit: 6 mmol/L — ABNORMAL HIGH (ref 0.0–2.0)
Acid-base deficit: 2 mmol/L (ref 0.0–2.0)
Acid-base deficit: 5 mmol/L — ABNORMAL HIGH (ref 0.0–2.0)
Acid-base deficit: 2 mmol/L (ref 0.0–2.0)
Acid-base deficit: 2 mmol/L (ref 0.0–2.0)
Bicarbonate: 17.6 mmol/L — ABNORMAL LOW (ref 20.0–28.0)
Bicarbonate: 20.1 mmol/L (ref 20.0–28.0)
Bicarbonate: 21.4 mmol/L (ref 20.0–28.0)
Bicarbonate: 25.7 mmol/L (ref 20.0–28.0)
Bicarbonate: 26.1 mmol/L (ref 20.0–28.0)
Bicarbonate: 25.6 mmol/L (ref 20.0–28.0)
Bicarbonate: 23.8 mmol/L (ref 20.0–28.0)
Bicarbonate: 24.1 mmol/L (ref 20.0–28.0)
Calcium, Ion: 1.15 mmol/L (ref 1.15–1.40)
Calcium, Ion: 0.86 mmol/L — CL (ref 1.15–1.40)
Calcium, Ion: 0.9 mmol/L — ABNORMAL LOW (ref 1.15–1.40)
Calcium, Ion: 0.92 mmol/L — ABNORMAL LOW (ref 1.15–1.40)
Calcium, Ion: 1.27 mmol/L (ref 1.15–1.40)
Calcium, Ion: 1.21 mmol/L (ref 1.15–1.40)
Calcium, Ion: 1.2 mmol/L (ref 1.15–1.40)
Calcium, Ion: 1.21 mmol/L (ref 1.15–1.40)
HCT: 34 % — ABNORMAL LOW (ref 36.0–46.0)
HCT: 19 % — ABNORMAL LOW (ref 36.0–46.0)
HCT: 20 % — ABNORMAL LOW (ref 36.0–46.0)
HCT: 24 % — ABNORMAL LOW (ref 36.0–46.0)
HCT: 26 % — ABNORMAL LOW (ref 36.0–46.0)
HCT: 33 % — ABNORMAL LOW (ref 36.0–46.0)
HCT: 26 % — ABNORMAL LOW (ref 36.0–46.0)
HCT: 28 % — ABNORMAL LOW (ref 36.0–46.0)
Hemoglobin: 11.6 g/dL — ABNORMAL LOW (ref 12.0–15.0)
Hemoglobin: 6.5 g/dL — CL (ref 12.0–15.0)
Hemoglobin: 6.8 g/dL — CL (ref 12.0–15.0)
Hemoglobin: 8.2 g/dL — ABNORMAL LOW (ref 12.0–15.0)
Hemoglobin: 8.8 g/dL — ABNORMAL LOW (ref 12.0–15.0)
Hemoglobin: 11.2 g/dL — ABNORMAL LOW (ref 12.0–15.0)
Hemoglobin: 8.8 g/dL — ABNORMAL LOW (ref 12.0–15.0)
Hemoglobin: 9.5 g/dL — ABNORMAL LOW (ref 12.0–15.0)
O2 Saturation: 100 %
O2 Saturation: 100 %
O2 Saturation: 100 %
O2 Saturation: 100 %
O2 Saturation: 100 %
O2 Saturation: 99 %
O2 Saturation: 99 %
O2 Saturation: 99 %
Patient temperature: 35.4
Patient temperature: 37
Patient temperature: 37.1
Potassium: 3.6 mmol/L (ref 3.5–5.1)
Potassium: 3.7 mmol/L (ref 3.5–5.1)
Potassium: 4.2 mmol/L (ref 3.5–5.1)
Potassium: 4.3 mmol/L (ref 3.5–5.1)
Potassium: 4.8 mmol/L (ref 3.5–5.1)
Potassium: 3.3 mmol/L — ABNORMAL LOW (ref 3.5–5.1)
Potassium: 4.8 mmol/L (ref 3.5–5.1)
Potassium: 4.5 mmol/L (ref 3.5–5.1)
Sodium: 141 mmol/L (ref 135–145)
Sodium: 135 mmol/L (ref 135–145)
Sodium: 136 mmol/L (ref 135–145)
Sodium: 137 mmol/L (ref 135–145)
Sodium: 138 mmol/L (ref 135–145)
Sodium: 140 mmol/L (ref 135–145)
Sodium: 139 mmol/L (ref 135–145)
Sodium: 138 mmol/L (ref 135–145)
TCO2: 18 mmol/L — ABNORMAL LOW (ref 22–32)
TCO2: 21 mmol/L — ABNORMAL LOW (ref 22–32)
TCO2: 22 mmol/L (ref 22–32)
TCO2: 27 mmol/L (ref 22–32)
TCO2: 27 mmol/L (ref 22–32)
TCO2: 27 mmol/L (ref 22–32)
TCO2: 25 mmol/L (ref 22–32)
TCO2: 26 mmol/L (ref 22–32)
pCO2 arterial: 27.1 mm[Hg] — ABNORMAL LOW (ref 32–48)
pCO2 arterial: 30.2 mm[Hg] — ABNORMAL LOW (ref 32–48)
pCO2 arterial: 33.2 mm[Hg] (ref 32–48)
pCO2 arterial: 36.7 mm[Hg] (ref 32–48)
pCO2 arterial: 40.8 mm[Hg] (ref 32–48)
pCO2 arterial: 38.1 mm[Hg] (ref 32–48)
pCO2 arterial: 46.4 mm[Hg] (ref 32–48)
pCO2 arterial: 46.7 mm[Hg] (ref 32–48)
pH, Arterial: 7.42 (ref 7.35–7.45)
pH, Arterial: 7.39 (ref 7.35–7.45)
pH, Arterial: 7.458 — ABNORMAL HIGH (ref 7.35–7.45)
pH, Arterial: 7.414 (ref 7.35–7.45)
pH, Arterial: 7.453 — ABNORMAL HIGH (ref 7.35–7.45)
pH, Arterial: 7.429 (ref 7.35–7.45)
pH, Arterial: 7.318 — ABNORMAL LOW (ref 7.35–7.45)
pH, Arterial: 7.321 — ABNORMAL LOW (ref 7.35–7.45)
pO2, Arterial: 401 mm[Hg] — ABNORMAL HIGH (ref 83–108)
pO2, Arterial: 281 mm[Hg] — ABNORMAL HIGH (ref 83–108)
pO2, Arterial: 462 mm[Hg] — ABNORMAL HIGH (ref 83–108)
pO2, Arterial: 281 mm[Hg] — ABNORMAL HIGH (ref 83–108)
pO2, Arterial: 452 mm[Hg] — ABNORMAL HIGH (ref 83–108)
pO2, Arterial: 143 mm[Hg] — ABNORMAL HIGH (ref 83–108)
pO2, Arterial: 149 mm[Hg] — ABNORMAL HIGH (ref 83–108)
pO2, Arterial: 141 mm[Hg] — ABNORMAL HIGH (ref 83–108)

## 2023-07-03 LAB — BASIC METABOLIC PANEL
Anion gap: 10 (ref 5–15)
Anion gap: 10 (ref 5–15)
Anion gap: 11 (ref 5–15)
BUN: 13 mg/dL (ref 8–23)
BUN: 18 mg/dL (ref 8–23)
BUN: 21 mg/dL (ref 8–23)
CO2: 21 mmol/L — ABNORMAL LOW (ref 22–32)
CO2: 22 mmol/L (ref 22–32)
CO2: 26 mmol/L (ref 22–32)
Calcium: 10.1 mg/dL (ref 8.9–10.3)
Calcium: 8.6 mg/dL — ABNORMAL LOW (ref 8.9–10.3)
Calcium: 9.9 mg/dL (ref 8.9–10.3)
Chloride: 102 mmol/L (ref 98–111)
Chloride: 103 mmol/L (ref 98–111)
Chloride: 105 mmol/L (ref 98–111)
Creatinine, Ser: 0.54 mg/dL (ref 0.44–1.00)
Creatinine, Ser: 0.67 mg/dL (ref 0.44–1.00)
Creatinine, Ser: 0.78 mg/dL (ref 0.44–1.00)
GFR, Estimated: >60 mL/min (ref >60)
GFR, Estimated: >60 mL/min (ref >60)
GFR, Estimated: >60 mL/min (ref >60)
Glucose, Bld: 144 mg/dL — ABNORMAL HIGH (ref 70–99)
Glucose, Bld: 161 mg/dL — ABNORMAL HIGH (ref 70–99)
Glucose, Bld: 93 mg/dL (ref 70–99)
Potassium: 4 mmol/L (ref 3.5–5.1)
Potassium: 4.4 mmol/L (ref 3.5–5.1)
Potassium: 4.6 mmol/L (ref 3.5–5.1)
Sodium: 134 mmol/L — ABNORMAL LOW (ref 135–145)
Sodium: 137 mmol/L (ref 135–145)
Sodium: 139 mmol/L (ref 135–145)

## 2023-07-03 LAB — POCT I-STAT, CHEM 8
BUN: 16 mg/dL (ref 8–23)
BUN: 17 mg/dL (ref 8–23)
BUN: 15 mg/dL (ref 8–23)
BUN: 15 mg/dL (ref 8–23)
Calcium, Ion: 1.16 mmol/L (ref 1.15–1.40)
Calcium, Ion: 1.28 mmol/L (ref 1.15–1.40)
Calcium, Ion: 0.83 mmol/L — CL (ref 1.15–1.40)
Calcium, Ion: 1.26 mmol/L (ref 1.15–1.40)
Chloride: 108 mmol/L (ref 98–111)
Chloride: 103 mmol/L (ref 98–111)
Chloride: 101 mmol/L (ref 98–111)
Chloride: 96 mmol/L — ABNORMAL LOW (ref 98–111)
Creatinine, Ser: 0.5 mg/dL (ref 0.44–1.00)
Creatinine, Ser: 0.6 mg/dL (ref 0.44–1.00)
Creatinine, Ser: 0.6 mg/dL (ref 0.44–1.00)
Creatinine, Ser: 0.6 mg/dL (ref 0.44–1.00)
Glucose, Bld: 121 mg/dL — ABNORMAL HIGH (ref 70–99)
Glucose, Bld: 196 mg/dL — ABNORMAL HIGH (ref 70–99)
Glucose, Bld: 113 mg/dL — ABNORMAL HIGH (ref 70–99)
Glucose, Bld: 125 mg/dL — ABNORMAL HIGH (ref 70–99)
HCT: 37 % (ref 36.0–46.0)
HCT: 33 % — ABNORMAL LOW (ref 36.0–46.0)
HCT: 25 % — ABNORMAL LOW (ref 36.0–46.0)
HCT: 25 % — ABNORMAL LOW (ref 36.0–46.0)
Hemoglobin: 12.6 g/dL (ref 12.0–15.0)
Hemoglobin: 11.2 g/dL — ABNORMAL LOW (ref 12.0–15.0)
Hemoglobin: 8.5 g/dL — ABNORMAL LOW (ref 12.0–15.0)
Hemoglobin: 8.5 g/dL — ABNORMAL LOW (ref 12.0–15.0)
Potassium: 3.6 mmol/L (ref 3.5–5.1)
Potassium: 3.8 mmol/L (ref 3.5–5.1)
Potassium: 4.3 mmol/L (ref 3.5–5.1)
Potassium: 4.7 mmol/L (ref 3.5–5.1)
Sodium: 142 mmol/L (ref 135–145)
Sodium: 135 mmol/L (ref 135–145)
Sodium: 137 mmol/L (ref 135–145)
Sodium: 145 mmol/L (ref 135–145)
TCO2: 20 mmol/L — ABNORMAL LOW (ref 22–32)
TCO2: 24 mmol/L (ref 22–32)
TCO2: 28 mmol/L (ref 22–32)
TCO2: 37 mmol/L — ABNORMAL HIGH (ref 22–32)

## 2023-07-03 LAB — POCT I-STAT EG7
Acid-base deficit: 2 mmol/L (ref 0.0–2.0)
Bicarbonate: 22.9 mmol/L (ref 20.0–28.0)
Calcium, Ion: 1 mmol/L — ABNORMAL LOW (ref 1.15–1.40)
HCT: 21 % — ABNORMAL LOW (ref 36.0–46.0)
Hemoglobin: 7.1 g/dL — ABNORMAL LOW (ref 12.0–15.0)
O2 Saturation: 86 %
Potassium: 3.5 mmol/L (ref 3.5–5.1)
Sodium: 136 mmol/L (ref 135–145)
TCO2: 24 mmol/L (ref 22–32)
pCO2, Ven: 35.9 mm[Hg] — ABNORMAL LOW (ref 44–60)
pH, Ven: 7.412 (ref 7.25–7.43)
pO2, Ven: 50 mm[Hg] — ABNORMAL HIGH (ref 32–45)

## 2023-07-03 LAB — GLUCOSE, CAPILLARY
Glucose-Capillary: 140 mg/dL — ABNORMAL HIGH (ref 70–99)
Glucose-Capillary: 114 mg/dL — ABNORMAL HIGH (ref 70–99)
Glucose-Capillary: 117 mg/dL — ABNORMAL HIGH (ref 70–99)
Glucose-Capillary: 113 mg/dL — ABNORMAL HIGH (ref 70–99)
Glucose-Capillary: 76 mg/dL (ref 70–99)
Glucose-Capillary: 110 mg/dL — ABNORMAL HIGH (ref 70–99)
Glucose-Capillary: 97 mg/dL (ref 70–99)
Glucose-Capillary: 101 mg/dL — ABNORMAL HIGH (ref 70–99)
Glucose-Capillary: 181 mg/dL — ABNORMAL HIGH (ref 70–99)
Glucose-Capillary: 158 mg/dL — ABNORMAL HIGH (ref 70–99)

## 2023-07-03 LAB — PREPARE RBC (CROSSMATCH)

## 2023-07-03 LAB — PLATELET COUNT: Platelets: 130 10*3/uL — ABNORMAL LOW (ref 150–400)

## 2023-07-03 LAB — MAGNESIUM: Magnesium: 3.3 mg/dL — ABNORMAL HIGH (ref 1.7–2.4)

## 2023-07-03 LAB — ECHO INTRAOPERATIVE TEE
Height: 61 in
Weight: 1712 [oz_av]

## 2023-07-03 LAB — HEMOGLOBIN AND HEMATOCRIT, BLOOD
HCT: 25.6 % — ABNORMAL LOW (ref 36.0–46.0)
Hemoglobin: 8.4 g/dL — ABNORMAL LOW (ref 12.0–15.0)

## 2023-07-03 LAB — APTT
aPTT: 23 s — ABNORMAL LOW (ref 24–36)
aPTT: 38 s — ABNORMAL HIGH (ref 24–36)

## 2023-07-03 LAB — PROTIME-INR
INR: 1 (ref 0.8–1.2)
INR: 1.4 — ABNORMAL HIGH (ref 0.8–1.2)
Prothrombin Time: 12.9 s (ref 11.4–15.2)
Prothrombin Time: 16.9 s — ABNORMAL HIGH (ref 11.4–15.2)

## 2023-07-03 SURGERY — CORONARY ARTERY BYPASS GRAFTING (CABG)
Anesthesia: General | Site: Chest

## 2023-07-03 MED ORDER — ACETAMINOPHEN 160 MG/5ML PO SOLN
650.0000 mg | Freq: Once | ORAL | Status: AC
Start: 2023-07-03 — End: 2023-07-03
  Administered 2023-07-03: 650 mg
  Filled 2023-07-03: qty 20.3

## 2023-07-03 MED ORDER — FENTANYL CITRATE (PF) 250 MCG/5ML IJ SOLN
INTRAMUSCULAR | Status: AC
  Filled 2023-07-03: qty 5

## 2023-07-03 MED ORDER — METOCLOPRAMIDE HCL 5 MG/ML IJ SOLN
10.0000 mg | Freq: Four times a day (QID) | INTRAMUSCULAR | Status: AC
  Administered 2023-07-03 – 2023-07-04 (×6): 10 mg via INTRAVENOUS
  Filled 2023-07-03 (×6): qty 2

## 2023-07-03 MED ORDER — EPHEDRINE SULFATE-NACL 50-0.9 MG/10ML-% IV SOSY
PREFILLED_SYRINGE | INTRAVENOUS | Status: DC | PRN
  Administered 2023-07-03 (×3): 2.5 mg via INTRAVENOUS

## 2023-07-03 MED ORDER — PANTOPRAZOLE SODIUM 40 MG IV SOLR
40.0000 mg | Freq: Every day | INTRAVENOUS | Status: AC
  Administered 2023-07-03 – 2023-07-04 (×2): 40 mg via INTRAVENOUS
  Filled 2023-07-03 (×2): qty 10

## 2023-07-03 MED ORDER — PROTAMINE SULFATE 10 MG/ML IV SOLN
INTRAVENOUS | Status: AC
  Filled 2023-07-03: qty 25

## 2023-07-03 MED ORDER — LACTATED RINGERS IV SOLN: INTRAVENOUS | Status: DC | PRN

## 2023-07-03 MED ORDER — MORPHINE SULFATE (PF) 2 MG/ML IV SOLN
1.0000 mg | INTRAVENOUS | Status: DC | PRN
  Administered 2023-07-03: 2 mg via INTRAVENOUS
  Administered 2023-07-03: 1 mg via INTRAVENOUS
  Administered 2023-07-03: 2 mg via INTRAVENOUS
  Filled 2023-07-03 (×3): qty 1

## 2023-07-03 MED ORDER — INSULIN REGULAR(HUMAN) IN NACL 100-0.9 UT/100ML-% IV SOLN: INTRAVENOUS | Status: DC

## 2023-07-03 MED ORDER — LACTATED RINGERS IV SOLN: INTRAVENOUS | Status: DC

## 2023-07-03 MED ORDER — PHENYLEPHRINE 80 MCG/ML (10ML) SYRINGE FOR IV PUSH (FOR BLOOD PRESSURE SUPPORT)
PREFILLED_SYRINGE | INTRAVENOUS | Status: AC
Start: 2023-07-03 — End: ?
  Filled 2023-07-03: qty 10

## 2023-07-03 MED ORDER — ROCURONIUM BROMIDE 10 MG/ML (PF) SYRINGE
PREFILLED_SYRINGE | INTRAVENOUS | Status: AC
Start: 2023-07-03 — End: ?
  Filled 2023-07-03: qty 20

## 2023-07-03 MED ORDER — MIDAZOLAM HCL (PF) 10 MG/2ML IJ SOLN
INTRAMUSCULAR | Status: AC
  Filled 2023-07-03: qty 2

## 2023-07-03 MED ORDER — ONDANSETRON HCL 4 MG/2ML IJ SOLN
INTRAMUSCULAR | Status: DC | PRN
  Administered 2023-07-03: 4 mg via INTRAVENOUS

## 2023-07-03 MED ORDER — OXYCODONE HCL 5 MG PO TABS
5.0000 mg | ORAL_TABLET | ORAL | Status: DC | PRN
  Administered 2023-07-03 – 2023-07-05 (×3): 5 mg via ORAL
  Filled 2023-07-03 (×4): qty 1

## 2023-07-03 MED ORDER — CHLORHEXIDINE GLUCONATE 0.12 % MT SOLN
15.0000 mL | OROMUCOSAL | Status: AC
  Administered 2023-07-03: 15 mL via OROMUCOSAL
  Filled 2023-07-03: qty 15

## 2023-07-03 MED ORDER — ASPIRIN 81 MG PO CHEW: 324.0000 mg | CHEWABLE_TABLET | Freq: Every day | ORAL | Status: DC

## 2023-07-03 MED ORDER — SODIUM CHLORIDE 0.9% FLUSH: 3.0000 mL | INTRAVENOUS | Status: DC | PRN

## 2023-07-03 MED ORDER — ORAL CARE MOUTH RINSE: 15.0000 mL | Freq: Once | OROMUCOSAL | Status: DC

## 2023-07-03 MED ORDER — LACTATED RINGERS IV SOLN: INTRAVENOUS | Status: AC

## 2023-07-03 MED ORDER — VANCOMYCIN HCL IN DEXTROSE 1-5 GM/200ML-% IV SOLN
1000.0000 mg | Freq: Once | INTRAVENOUS | Status: AC
  Administered 2023-07-03: 1000 mg via INTRAVENOUS
  Filled 2023-07-03: qty 200

## 2023-07-03 MED ORDER — MIDAZOLAM HCL 2 MG/2ML IJ SOLN: 2.0000 mg | INTRAMUSCULAR | Status: DC | PRN

## 2023-07-03 MED ORDER — ONDANSETRON HCL 4 MG/2ML IJ SOLN
4.0000 mg | Freq: Four times a day (QID) | INTRAMUSCULAR | Status: AC | PRN
Start: 2023-07-03 — End: ?
  Administered 2023-07-04 – 2023-07-08 (×4): 4 mg via INTRAVENOUS
  Filled 2023-07-03 (×4): qty 2

## 2023-07-03 MED ORDER — MAGNESIUM SULFATE 4 GM/100ML IV SOLN
4.0000 g | Freq: Once | INTRAVENOUS | Status: AC
Start: 2023-07-03 — End: 2023-07-03
  Administered 2023-07-03: 4 g via INTRAVENOUS
  Filled 2023-07-03: qty 100

## 2023-07-03 MED ORDER — METOPROLOL TARTRATE 12.5 MG HALF TABLET
12.5000 mg | ORAL_TABLET | Freq: Two times a day (BID) | ORAL | Status: DC
Start: 2023-07-03 — End: 2023-07-04
  Administered 2023-07-04: 12.5 mg via ORAL
  Filled 2023-07-03: qty 1

## 2023-07-03 MED ORDER — ASPIRIN 81 MG PO CHEW
324.0000 mg | CHEWABLE_TABLET | Freq: Once | ORAL | Status: AC
Start: 2023-07-03 — End: 2023-07-03
  Administered 2023-07-03: 324 mg via ORAL
  Filled 2023-07-03: qty 4

## 2023-07-03 MED ORDER — PHENYLEPHRINE 80 MCG/ML (10ML) SYRINGE FOR IV PUSH (FOR BLOOD PRESSURE SUPPORT)
PREFILLED_SYRINGE | INTRAVENOUS | Status: DC | PRN
  Administered 2023-07-03: 40 ug via INTRAVENOUS
  Administered 2023-07-03: 80 ug via INTRAVENOUS

## 2023-07-03 MED ORDER — INSULIN REGULAR(HUMAN) IN NACL 100-0.9 UT/100ML-% IV SOLN
INTRAVENOUS | Status: DC | PRN
  Administered 2023-07-03: 1.5 [IU]/h via INTRAVENOUS

## 2023-07-03 MED ORDER — DEXMEDETOMIDINE HCL IN NACL 400 MCG/100ML IV SOLN: 0.0000 ug/kg/h | INTRAVENOUS | Status: DC

## 2023-07-03 MED ORDER — SODIUM CHLORIDE 0.45 % IV SOLN: INTRAVENOUS | Status: AC | PRN

## 2023-07-03 MED ORDER — ALBUMIN HUMAN 5 % IV SOLN: INTRAVENOUS | Status: DC | PRN

## 2023-07-03 MED ORDER — PLASMA-LYTE A IV SOLN
INTRAVENOUS | Status: DC | PRN
  Administered 2023-07-03: 1000 mL via INTRAVASCULAR

## 2023-07-03 MED ORDER — ALBUMIN HUMAN 5 % IV SOLN
250.0000 mL | INTRAVENOUS | Status: AC | PRN
  Administered 2023-07-03 – 2023-07-04 (×5): 12.5 g via INTRAVENOUS
  Filled 2023-07-03 (×2): qty 250

## 2023-07-03 MED ORDER — DEXMEDETOMIDINE HCL IN NACL 400 MCG/100ML IV SOLN
INTRAVENOUS | Status: DC | PRN
  Administered 2023-07-03: .3 ug/kg/h via INTRAVENOUS

## 2023-07-03 MED ORDER — PHENYLEPHRINE HCL-NACL 20-0.9 MG/250ML-% IV SOLN
INTRAVENOUS | Status: DC | PRN
  Administered 2023-07-03: 30 ug/min via INTRAVENOUS

## 2023-07-03 MED ORDER — METOPROLOL TARTRATE 25 MG/10 ML ORAL SUSPENSION: 12.5000 mg | Freq: Two times a day (BID) | ORAL | Status: DC

## 2023-07-03 MED ORDER — SODIUM CHLORIDE 0.9% FLUSH
3.0000 mL | Freq: Two times a day (BID) | INTRAVENOUS | Status: DC
  Administered 2023-07-04 – 2023-07-05 (×3): 3 mL via INTRAVENOUS

## 2023-07-03 MED ORDER — ROCURONIUM BROMIDE 100 MG/10ML IV SOLN
INTRAVENOUS | Status: DC | PRN
  Administered 2023-07-03: 10 mg via INTRAVENOUS
  Administered 2023-07-03: 100 mg via INTRAVENOUS

## 2023-07-03 MED ORDER — SODIUM CHLORIDE 0.9 % IV SOLN: INTRAVENOUS | Status: AC

## 2023-07-03 MED ORDER — PROTAMINE SULFATE 10 MG/ML IV SOLN
INTRAVENOUS | Status: DC | PRN
  Administered 2023-07-03: 30 mg via INTRAVENOUS
  Administered 2023-07-03: 150 mg via INTRAVENOUS

## 2023-07-03 MED ORDER — PROPOFOL 10 MG/ML IV BOLUS
INTRAVENOUS | Status: AC
  Filled 2023-07-03: qty 20

## 2023-07-03 MED ORDER — FENTANYL CITRATE (PF) 100 MCG/2ML IJ SOLN
INTRAMUSCULAR | Status: DC | PRN
  Administered 2023-07-03: 25 ug via INTRAVENOUS
  Administered 2023-07-03: 50 ug via INTRAVENOUS
  Administered 2023-07-03 (×2): 100 ug via INTRAVENOUS
  Administered 2023-07-03: 75 ug via INTRAVENOUS
  Administered 2023-07-03 (×4): 50 ug via INTRAVENOUS
  Administered 2023-07-03: 100 ug via INTRAVENOUS
  Administered 2023-07-03: 50 ug via INTRAVENOUS

## 2023-07-03 MED ORDER — PROPOFOL 10 MG/ML IV BOLUS
INTRAVENOUS | Status: DC | PRN
  Administered 2023-07-03: 50 mg via INTRAVENOUS

## 2023-07-03 MED ORDER — HEMOSTATIC AGENTS (NO CHARGE) OPTIME
TOPICAL | Status: DC | PRN
  Administered 2023-07-03: 1 via TOPICAL

## 2023-07-03 MED ORDER — DOCUSATE SODIUM 100 MG PO CAPS
200.0000 mg | ORAL_CAPSULE | Freq: Every day | ORAL | Status: DC
Start: 2023-07-04 — End: 2023-07-05
  Administered 2023-07-04: 200 mg via ORAL
  Filled 2023-07-03: qty 2

## 2023-07-03 MED ORDER — SODIUM CHLORIDE 0.9 % IV SOLN
250.0000 mL | INTRAVENOUS | Status: DC
  Administered 2023-07-04: 250 mL via INTRAVENOUS

## 2023-07-03 MED ORDER — SUGAMMADEX SODIUM 200 MG/2ML IV SOLN
INTRAVENOUS | Status: DC | PRN
  Administered 2023-07-03: 100 mg via INTRAVENOUS

## 2023-07-03 MED ORDER — BISACODYL 5 MG PO TBEC
10.0000 mg | DELAYED_RELEASE_TABLET | Freq: Every day | ORAL | Status: DC
  Administered 2023-07-04: 10 mg via ORAL
  Filled 2023-07-03: qty 2

## 2023-07-03 MED ORDER — PHENYLEPHRINE HCL-NACL 20-0.9 MG/250ML-% IV SOLN: 0.0000 ug/min | INTRAVENOUS | Status: DC

## 2023-07-03 MED ORDER — ONDANSETRON HCL 4 MG/2ML IJ SOLN
INTRAMUSCULAR | Status: AC
  Filled 2023-07-03: qty 2

## 2023-07-03 MED ORDER — METOPROLOL TARTRATE 5 MG/5ML IV SOLN
2.5000 mg | INTRAVENOUS | Status: DC | PRN
Start: 2023-07-03 — End: 2023-07-10
  Administered 2023-07-04 (×2): 5 mg via INTRAVENOUS
  Administered 2023-07-05: 2.5 mg via INTRAVENOUS
  Administered 2023-07-05: 5 mg via INTRAVENOUS
  Filled 2023-07-03 (×5): qty 5

## 2023-07-03 MED ORDER — DEXTROSE 50 % IV SOLN: 0.0000 mL | INTRAVENOUS | Status: DC | PRN

## 2023-07-03 MED ORDER — PANTOPRAZOLE SODIUM 40 MG PO TBEC
40.0000 mg | DELAYED_RELEASE_TABLET | Freq: Every day | ORAL | Status: DC
  Administered 2023-07-05 – 2023-07-10 (×6): 40 mg via ORAL
  Filled 2023-07-03 (×6): qty 1

## 2023-07-03 MED ORDER — POTASSIUM CHLORIDE 10 MEQ/50ML IV SOLN
10.0000 meq | INTRAVENOUS | Status: AC
  Administered 2023-07-03 (×3): 10 meq via INTRAVENOUS

## 2023-07-03 MED ORDER — BISACODYL 10 MG RE SUPP: 10.0000 mg | Freq: Every day | RECTAL | Status: DC

## 2023-07-03 MED ORDER — PHENYLEPHRINE HCL (PRESSORS) 10 MG/ML IV SOLN
INTRAVENOUS | Status: DC | PRN
  Administered 2023-07-03: 80 ug via INTRAVENOUS
  Administered 2023-07-03: 40 ug via INTRAVENOUS
  Administered 2023-07-03: 80 ug via INTRAVENOUS

## 2023-07-03 MED ORDER — CEFAZOLIN SODIUM-DEXTROSE 2-4 GM/100ML-% IV SOLN
2.0000 g | Freq: Three times a day (TID) | INTRAVENOUS | Status: AC
  Administered 2023-07-03 – 2023-07-05 (×6): 2 g via INTRAVENOUS
  Filled 2023-07-03 (×6): qty 100

## 2023-07-03 MED ORDER — ASPIRIN 325 MG PO TBEC
325.0000 mg | DELAYED_RELEASE_TABLET | Freq: Every day | ORAL | Status: DC
  Administered 2023-07-04 – 2023-07-10 (×7): 325 mg via ORAL
  Filled 2023-07-03 (×7): qty 1

## 2023-07-03 MED ORDER — ESMOLOL HCL 100 MG/10ML IV SOLN
INTRAVENOUS | Status: DC | PRN
  Administered 2023-07-03: 10 mg via INTRAVENOUS

## 2023-07-03 MED ORDER — SODIUM CHLORIDE (PF) 0.9 % IJ SOLN: OROMUCOSAL | Status: DC | PRN

## 2023-07-03 MED ORDER — CHLORHEXIDINE GLUCONATE 0.12 % MT SOLN: 15.0000 mL | Freq: Once | OROMUCOSAL | Status: DC

## 2023-07-03 MED ORDER — TRAMADOL HCL 50 MG PO TABS
50.0000 mg | ORAL_TABLET | ORAL | Status: DC | PRN
Start: 2023-07-03 — End: 2023-07-10
  Administered 2023-07-05 – 2023-07-10 (×5): 100 mg via ORAL
  Filled 2023-07-03 (×5): qty 2

## 2023-07-03 MED ORDER — HEPARIN SODIUM (PORCINE) 1000 UNIT/ML IJ SOLN
INTRAMUSCULAR | Status: DC | PRN
  Administered 2023-07-03: 2000 [IU] via INTRAVENOUS
  Administered 2023-07-03: 16000 [IU] via INTRAVENOUS

## 2023-07-03 MED ORDER — ACETAMINOPHEN 500 MG PO TABS
1000.0000 mg | ORAL_TABLET | Freq: Four times a day (QID) | ORAL | Status: AC
  Administered 2023-07-03 – 2023-07-08 (×9): 1000 mg via ORAL
  Filled 2023-07-03 (×10): qty 2

## 2023-07-03 MED ORDER — CHLORHEXIDINE GLUCONATE CLOTH 2 % EX PADS
6.0000 | MEDICATED_PAD | Freq: Every day | CUTANEOUS | Status: DC
  Administered 2023-07-03 – 2023-07-09 (×6): 6 via TOPICAL

## 2023-07-03 MED ORDER — MIDAZOLAM HCL (PF) 5 MG/ML IJ SOLN
INTRAMUSCULAR | Status: DC | PRN
  Administered 2023-07-03: 2 mg via INTRAVENOUS
  Administered 2023-07-03 (×2): 1 mg via INTRAVENOUS

## 2023-07-03 MED ORDER — ACETAMINOPHEN 160 MG/5ML PO SOLN: 1000.0000 mg | Freq: Four times a day (QID) | ORAL | Status: AC

## 2023-07-03 MED ORDER — HEPARIN SODIUM (PORCINE) 1000 UNIT/ML IJ SOLN
INTRAMUSCULAR | Status: AC
Start: 2023-07-03 — End: ?
  Filled 2023-07-03: qty 1

## 2023-07-03 SURGICAL SUPPLY — 82 items
ANTIFOG SOL W/FOAM PAD STRL (MISCELLANEOUS) ×2
BAG DECANTER FOR FLEXI CONT (MISCELLANEOUS) ×3 IMPLANT
BLADE CLIPPER SURG (BLADE) ×3 IMPLANT
BLADE STERNUM SYSTEM 6 (BLADE) ×3 IMPLANT
BNDG ELASTIC 4INX 5YD STR LF (GAUZE/BANDAGES/DRESSINGS) IMPLANT
BNDG ELASTIC 4X5.8 VLCR STR LF (GAUZE/BANDAGES/DRESSINGS) ×3 IMPLANT
BNDG ELASTIC 6INX 5YD STR LF (GAUZE/BANDAGES/DRESSINGS) IMPLANT
BNDG ELASTIC 6X5.8 VLCR STR LF (GAUZE/BANDAGES/DRESSINGS) ×3 IMPLANT
BNDG GAUZE DERMACEA FLUFF 4 (GAUZE/BANDAGES/DRESSINGS) ×3 IMPLANT
CANISTER SUCT 3000ML PPV (MISCELLANEOUS) ×3 IMPLANT
CANNULA EZ GLIDE AORTIC 21FR (CANNULA) ×3 IMPLANT
CANNULA MC2 2 STG 36/46 CONN (CANNULA) IMPLANT
CATH CPB KIT HENDRICKSON (MISCELLANEOUS) ×3 IMPLANT
CATH ROBINSON RED A/P 18FR (CATHETERS) ×3 IMPLANT
CATH THORACIC 36FR (CATHETERS) ×3 IMPLANT
CATH THORACIC 36FR RT ANG (CATHETERS) ×3 IMPLANT
CLIP TI MEDIUM 24 (CLIP) IMPLANT
CLIP TI WIDE RED SMALL 24 (CLIP) IMPLANT
CONTAINER PROTECT SURGISLUSH (MISCELLANEOUS) ×6 IMPLANT
DERMABOND ADVANCED .7 DNX12 (GAUZE/BANDAGES/DRESSINGS) IMPLANT
DRAPE SRG 135X102X78XABS (DRAPES) ×3 IMPLANT
DRAPE WARM FLUID 44X44 (DRAPES) ×3 IMPLANT
DRSG COVADERM 4X14 (GAUZE/BANDAGES/DRESSINGS) ×3 IMPLANT
ELECT REM PT RETURN 9FT ADLT (ELECTROSURGICAL) ×4
ELECTRODE REM PT RTRN 9FT ADLT (ELECTROSURGICAL) ×6 IMPLANT
FELT TEFLON 1X6 (MISCELLANEOUS) ×6 IMPLANT
GAUZE 4X4 16PLY ~~LOC~~+RFID DBL (SPONGE) ×3 IMPLANT
GAUZE SPONGE 4X4 12PLY STRL (GAUZE/BANDAGES/DRESSINGS) ×6 IMPLANT
GLOVE ECLIPSE 7.0 STRL STRAW (GLOVE) IMPLANT
GLOVE SS BIOGEL STRL SZ 7.5 (GLOVE) ×3 IMPLANT
GLOVE SURG SIGNA 7.5 PF LTX (GLOVE) ×9 IMPLANT
GOWN STRL REUS W/ TWL LRG LVL3 (GOWN DISPOSABLE) ×12 IMPLANT
GOWN STRL REUS W/ TWL XL LVL3 (GOWN DISPOSABLE) ×6 IMPLANT
HEMOSTAT POWDER SURGIFOAM 1G (HEMOSTASIS) ×9 IMPLANT
HEMOSTAT SURGICEL 2X14 (HEMOSTASIS) ×3 IMPLANT
INSERT FOGARTY XLG (MISCELLANEOUS) IMPLANT
KIT BASIN OR (CUSTOM PROCEDURE TRAY) ×3 IMPLANT
KIT SUCTION CATH 14FR (SUCTIONS) ×6 IMPLANT
KIT TURNOVER KIT B (KITS) ×3 IMPLANT
KIT VASOVIEW HEMOPRO 2 VH 4000 (KITS) ×3 IMPLANT
MARKER GRAFT CORONARY BYPASS (MISCELLANEOUS) ×9 IMPLANT
NS IRRIG 1000ML POUR BTL (IV SOLUTION) ×15 IMPLANT
PACK E OPEN HEART (SUTURE) ×3 IMPLANT
PACK OPEN HEART (CUSTOM PROCEDURE TRAY) ×3 IMPLANT
PAD ARMBOARD 7.5X6 YLW CONV (MISCELLANEOUS) ×6 IMPLANT
PAD ELECT DEFIB RADIOL ZOLL (MISCELLANEOUS) ×3 IMPLANT
PENCIL BUTTON HOLSTER BLD 10FT (ELECTRODE) ×3 IMPLANT
POSITIONER HEAD DONUT 9IN (MISCELLANEOUS) ×3 IMPLANT
PUNCH AORTIC ROTATE 4.0MM (MISCELLANEOUS) IMPLANT
PUNCH AORTIC ROTATE 4.5MM 8IN (MISCELLANEOUS) IMPLANT
PUNCH AORTIC ROTATE 5MM 8IN (MISCELLANEOUS) IMPLANT
SET MPS 3-ND DEL (MISCELLANEOUS) IMPLANT
SOLUTION ANTFG W/FOAM PAD STRL (MISCELLANEOUS) IMPLANT
SPONGE T-LAP 18X18 ~~LOC~~+RFID (SPONGE) ×12 IMPLANT
SPONGE T-LAP 4X18 ~~LOC~~+RFID (SPONGE) ×3 IMPLANT
SUPPORT HEART JANKE-BARRON (MISCELLANEOUS) ×3 IMPLANT
SUT BONE WAX W31G (SUTURE) ×3 IMPLANT
SUT MNCRL AB 4-0 PS2 18 (SUTURE) IMPLANT
SUT PROLENE 3 0 SH DA (SUTURE) ×3 IMPLANT
SUT PROLENE 4 0 SH DA (SUTURE) IMPLANT
SUT PROLENE 4-0 RB1 .5 CRCL 36 (SUTURE) IMPLANT
SUT PROLENE 6 0 C 1 30 (SUTURE) ×6 IMPLANT
SUT PROLENE 7 0 BV 1 (SUTURE) IMPLANT
SUT PROLENE 7 0 BV1 MDA (SUTURE) ×3 IMPLANT
SUT PROLENE 8 0 BV175 6 (SUTURE) IMPLANT
SUT STEEL 6MS V (SUTURE) ×3 IMPLANT
SUT STEEL STERNAL CCS#1 18IN (SUTURE) IMPLANT
SUT STEEL SZ 6 DBL 3X14 BALL (SUTURE) ×3 IMPLANT
SUT VIC AB 1 CTX36XBRD ANBCTR (SUTURE) ×6 IMPLANT
SUT VIC AB 2-0 CT1 TAPERPNT 27 (SUTURE) IMPLANT
SUT VIC AB 2-0 CTX 27 (SUTURE) IMPLANT
SUT VIC AB 3-0 SH 27X BRD (SUTURE) IMPLANT
SUT VIC AB 3-0 X1 27 (SUTURE) IMPLANT
SYSTEM SAHARA CHEST DRAIN ATS (WOUND CARE) ×3 IMPLANT
TAPE CLOTH SURG 4X10 WHT LF (GAUZE/BANDAGES/DRESSINGS) IMPLANT
TAPE PAPER 1X10 WHT MICROPORE (GAUZE/BANDAGES/DRESSINGS) IMPLANT
TOWEL GREEN STERILE (TOWEL DISPOSABLE) ×3 IMPLANT
TOWEL GREEN STERILE FF (TOWEL DISPOSABLE) ×3 IMPLANT
TRAY FOLEY SLVR 16FR TEMP STAT (SET/KITS/TRAYS/PACK) ×3 IMPLANT
TUBING LAP HI FLOW INSUFFLATIO (TUBING) ×3 IMPLANT
UNDERPAD 30X36 HEAVY ABSORB (UNDERPADS AND DIAPERS) ×3 IMPLANT
WATER STERILE IRR 1000ML POUR (IV SOLUTION) ×6 IMPLANT

## 2023-07-03 NOTE — Procedures (Signed)
 Extubation Procedure Note  Patient Details:   Name: [name] DOB: April 04, 1956 MRN: [id number]   Airway Documentation:    Vent end date: 07/03/23 Vent end time: 1832   Evaluation  O2 sats: stable throughout Complications: No apparent complications Patient did tolerate procedure well. Bilateral Breath Sounds: Clear, Diminished   Yes  Patient extubated per order and protocol to 4L [place] with no complications. Positive cuff leak was noted prior to extubation. Patient achieved NIF of -22 and VC of .45L with good patient effort. Patient has strong cough and is able to weakly speak. Vitals are stable. RT will continue to monitor.   Dashia Caldeira CHRISTELLA Anon 07/03/2023, 6:54 PM

## 2023-07-03 NOTE — Op Note (Signed)
 NAME: [name], [name] MEDICAL RECORD NO: [id number] ACCOUNT NO: [id number] DATE OF BIRTH: 1956/02/01 FACILITY: [facility] LOCATION: [facility] PHYSICIAN: Elspeth BROCKS. Kerrin, MD  Operative Report   DATE OF PROCEDURE: 07/03/2023  PREOPERATIVE DIAGNOSIS:  Three-vessel coronary artery disease status post MI.  POSTOPERATIVE DIAGNOSIS:  Three-vessel coronary artery disease status post MI.  PROCEDURE:   Median sternotomy, extracorporeal circulation,  Coronary artery bypass grafting x4  Left internal mammary artery to LAD,  Saphenous vein graft to distal right coronary,  Sequential saphenous vein graft to ramus intermedius and obtuse marginal.  Endoscopic vein harvest right leg.  SURGEON:  Elspeth BROCKS. Kerrin, MD  ASSISTANT:  Rocky Shad, PA  Experienced assistance was necessary for this case due to surgical complexity.  Erin Barrett assisted by independently harvesting the saphenous vein while I harvested the mammary artery.  She closed the leg incisions and then assisted with exposure, retraction of delicate tissue, suctioning, and suture management during the anastomoses.  ANESTHESIA:  General.  FINDINGS:  Transesophageal echocardiography showed ejection fraction of approximately 45% with no significant valvular pathology, unchanged post bypass.  Mammary and vein good quality.  LAD and ramus intramyocardial diffusely diseased coronaries, OM and RCA fair quality targets.  CLINICAL NOTE: [name] is a 68 year old woman with multiple cardiac risk factors who presented with waxing and waning chest pain.  She ruled in for MI.  She underwent cardiac catheterization, which revealed severe three-vessel coronary artery disease and was referred for coronary bypass grafting.  The indications, risks, benefits, and alternatives were discussed in detail with the patient.  She understood and accepted the risks and agreed to proceed.  OPERATIVE NOTE: [name] was brought to the operating room on  07/03/2023.  She had induction of general anesthesia and was intubated.  Intravenous antibiotics were administered.  A Foley catheter was placed.  Transesophageal echocardiography was performed by Dr. Debby Like.  Findings as noted above, please see his separately dictated note for full details of the procedure.  The chest, abdomen, and legs were prepped and draped in the usual sterile fashion.  A timeout was performed.  The conduits were harvested simultaneously.  An incision was made in the medial aspect of the right leg at the level of the knee.  The greater saphenous vein was harvested endoscopically from the mid calf to the groin.  The saphenous vein was of good quality.  Simultaneously, a median sternotomy was performed and the left internal mammary artery was harvested using standard technique.  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  The mammary was relatively small vessel, but did have good flow when divided distally.  2000 units of heparin  was administered during the vessel harvest.  The remainder of the full heparin  dose was given prior to opening the pericardium.  The pericardium was opened.  The ascending aorta was inspected.  It was of normal caliber with no evidence of atherosclerotic disease. After confirming adequate anticoagulation with ACT measurement, the aorta was cannulated via concentric 2-0 Ethibond pledgeted pursestring sutures.  A dual stage venous cannula was placed via a pursestring suture in the right atrial appendage.  Cardiopulmonary bypass was initiated.  Flows were maintained per protocol.  The patient was cooled to 32 degrees Celsius.  The coronary arteries were inspected and anastomotic sites were chosen.  The conduits were inspected and cut to length.  The foam pad was placed in the pericardium  to insulate the heart.  A temperature probe was placed in the myocardial septum and a cardioplegia cannula was  placed in the ascending aorta.  The aorta was cross-clamped.  The left  ventricle was emptied via the aortic root vent.  Cardiac arrest then was achieved with a combination of cold antegrade blood cardioplegia and topical iced saline.  There was a rapid diastolic arrest and there was septal cooling to 11 degrees Celsius with 750 mL of cardioplegia.    A reversed saphenous vein graft then was placed end to side to the distal right coronary.  The anastomosis was extended onto the proximal extent of the posterior descending.  A 1.5-mm probe did pass into the posterior descending and then also into the continuation of the right coronary.  The vein was of good quality.  It was anastomosed end-to-side with a running 7-0 Prolene suture.  The artery was only fair quality because there was significant plaque in the area.  At the completion of the anastomosis, the probe did pass proximally and distally.  Cardioplegia then was administered down the graft and there was good flow and good hemostasis.  Next, a reversed saphenous vein graft was placed sequentially to the ramus intermedius and obtuse marginal.  The ramus intermedius was a high anterolateral branch.  It was intramyocardial.  It was a 1.5-mm good quality vessel at the site of the anastomosis, but did have plaque distally.  A side-to-side anastomosis was performed with a running 7-0 Prolene suture.  A probe passed easily proximally and distally.  The distal end of the vein then was cut to length and beveled.  It was anastomosed end-to-side to the obtuse marginal.  The obtuse marginal was a 1.5 mm vessel, but was diffusely diseased throughout its course.  It was grafted very distally.  It was done with a running 7-0 Prolene suture.  A probe did pass distally at the completion of the anastomosis.  Cardioplegia was administered down the graft and there was good flow and good hemostasis.  Additional cardioplegia was administered down the aortic root.  The left internal mammary artery was brought through a window in the pericardium.  The  distal end was beveled.  It was anastomosed end-to-side to the LAD.  The LAD was diffusely diseased and had plaque throughout its course.  It was only fair quality, but it did accept a 1.5-mm probe.  The mammary was 1.5 mm in diameter.  It was anastomosed end-to-side with a running 8-0 Prolene suture.  At the completion of the anastomosis, the Bulldog clamp was removed.  Septal rewarming was noted.  The Bulldog clamp was replaced and the mammary pedicle was tacked to the epicardial surface of the heart with 6-0 Prolene sutures.  Additional cardioplegia was administered.  The vein grafts were cut to length.  The cardioplegia cannula was removed from the ascending aorta.  The proximal vein graft anastomoses were performed to 4.0 mm punch aortotomies with running 6-0 Prolene sutures.  At the completion of the final proximal anastomosis, the patient was placed in a Trendelenburg position.  Lidocaine  was administered.  The aortic root was de-aired and the aortic cross clamp was removed.  The total cross-clamp time was 64 minutes.    The patient spontaneously resumed sinus rhythm and did not require defibrillation.  While rewarming was completed, all proximal and distal anastomosis were inspected for hemostasis.  Epicardial pacing wires were placed on the right ventricle and right atrium.  When the patient had rewarmed to her core temperature of 37 degrees Celsius, she was weaned from cardiopulmonary bypass on the first attempt.  She did  not require inotropic support.  The total bypass time was 95 minutes.  Post bypass transesophageal echocardiography was unchanged from the pre-bypass study.  A test dose of protamine  was administered and was well tolerated.  The atrial and aortic cannula were removed.  The remainder of the protamine  was administered without incident.  The chest was irrigated with warm saline.  Hemostasis was achieved.  The pericardium was reapproximated over the ascending aorta with interrupted 3-0  silk sutures.  The left pleural and mediastinal chest tubes were placed through separate subcostal incisions.  The sternum was closed with a single heavy gauge stainless steel wires.  The pectoralis fascia, subcutaneous tissue, and skin were closed in standard fashion.  All sponge, needle, and instrument counts were correct at the end of the procedure.  The patient was transported from the operating room to the surgical intensive care unit, intubated and in fair condition.   PUS D: 07/03/2023 1:52:20 pm T: 07/03/2023 3:27:00 pm  JOB: [id number]/ [id number]

## 2023-07-03 NOTE — Hospital Course (Addendum)
 History of Present Illness:  [name] is a 68 year old female with a past medical history of hypertension, hyperlipidemia, DM with neuropathy, GERD,  small left brain aneurysm, TIA (patient not aware of this), family history of CAD (father died of MI in his 50's),anxiety who presented to [facility] ED on 06/24/2023 with complaints of waxing and waning chest pain, in the setting of a hypertensive emergency. Per medical documentation, she had chest pain, worse with palpation on exam. Patient notes she also had one episode of nausea and vomiting but denied diaphoresis and LE edema. EKG showed sinus rhythm, LAFB, lateral TWI. Initial Troponin I (high sensitivity)  was 81 and max's at 94. CXR showed no acute abnormality. She was transferred to [facility] for further evaluation and treatment.   Hospital Course:Echo done 06/24/2023 showed LVEF 40-45%, regional wall motion abnormalities, trivial MR, no AI or AS, aortic root is normal in size and structure, and no pericardial effusion. Cardiac catheterization done 06/27/2023 showed LVEF 40-45%, ostial and proximal 80% LAD stenosis, left Circumflex with a 70% stenosis with mid to distal 95% stenosis, 80% stenosis of Ramus Intermediate, and mid RCA 80% stenosis.  Cardiac surgery consultation was requested for bypass evaluation.  At that time the patient reports that she has a had a rough past year-loss of her mother, living arrangements, and medication affordability. She has essentially put her on health on the back burner. Her niece is helping her look for a place to live, as she was in an extended stay place prior to presenting to the hospital.  Dr. Kerrin was in agreement CABG procedure was indicated.  The risks and benefits of the procedure were explained to the patient and she was agreeable to proceed.  She was taken to the operating room on 07/03/2023.  She underwent CABG x 4 utilizing LIMA to LAD, SVG to Distal RCA, and Sequential SVG to OM and Ramus  Intermediate.  She also underwent endoscopic harvest of greater saphenous vein from her right leg.  She tolerated the procedure without difficulty and was taken to the SICU in stable condition.  She was extubated the evening of surgery.  She was weaned off all drips by POD #1.  She required some prn Lopressor  pushes to HTN and oral Lopressor  was resumed.  She required transfusion in the operating room and her hemoglobin was stable at 8.7 and she did not require further transfusion.  CXR showed left lower lobe pleural effusion/atelectasis.  She was treated with IV lasix  and flutter valve was added.  She was also educated on the importance of deep breathing to help.  Her swan and arterial line were removed without difficulty.  The patient had hypertension.  She was resumed on Cozaar  and hydrochlorothiazide .  Her chest tubes and pacing wires were removed without difficulty.  She remained in NSR and felt stable for transfer to the progressive .  Blood pressure has been elevated but she has been gradually resumed on her home medications with good improvement.  She has had some sinus tachycardia and beta-blocker has been titrated.  She has been resumed on home Farxiga .  The patient developed pain along her left lateral chest region.  Chest xray was obtained and showed a left pleural effusion/atelectasis.  Interventional radiology consult was obtained for Thoracentesis.  However, ultrasound did not reveal presence of enough fluid to tap.  She was also started on a steroid taper.  She is ambulating without difficulty.  Her surgical incisions are healing without evidence  of infection.  She is stable for discharge home today.

## 2023-07-03 NOTE — Anesthesia Postprocedure Evaluation (Signed)
 Anesthesia Post Note  Patient: [name]  Procedure(s) Performed: CORONARY ARTERY BYPASS GRAFTING X 4, USING LEFT INTERNAL MAMMARY ARTERY AND ENDOSCOPICALLY HARVESTED RIGHT SAPHENOUS VEIN GRAFT (Chest) TRANSESOPHAGEAL ECHOCARDIOGRAM (TEE)     Patient location during evaluation: ICU Anesthesia Type: General Level of consciousness: sedated and patient remains intubated per anesthesia plan Pain management: pain level controlled Vital Signs Assessment: post-procedure vital signs reviewed and stable Respiratory status: patient remains intubated per anesthesia plan Cardiovascular status: stable Postop Assessment: no apparent nausea or vomiting Anesthetic complications: no   No notable events documented.  Last Vitals:  Vitals:   07/03/23 1430 07/03/23 1435  BP:    Pulse: 80 81  Resp: 16 16  Temp: (!) 35.7 C (!) 35.7 C  SpO2: 100% 100%    Last Pain:  Vitals:   07/03/23 0730  TempSrc:   PainSc: 0-No pain                 Debby FORBES Like

## 2023-07-03 NOTE — Anesthesia Procedure Notes (Signed)
 Central Venous Catheter Insertion Performed by: Lucious Debby BRAVO, MD, anesthesiologist Start/End1/01/2024 8:02 AM, 07/03/2023 8:12 AM Patient location: Pre-op. Preanesthetic checklist: patient identified, IV checked, risks and benefits discussed, surgical consent, monitors and equipment checked, pre-op evaluation, timeout performed and anesthesia consent Position: Trendelenburg Lidocaine  1% used for infiltration and patient sedated Hand hygiene performed , maximum sterile barriers used  and Seldinger technique used Catheter size: 8.5 Fr Central line was placed.Sheath introducer Procedure performed using ultrasound guided technique. Ultrasound Notes:anatomy identified, needle tip was noted to be adjacent to the nerve/plexus identified, no ultrasound evidence of intravascular and/or intraneural injection and image(s) printed for medical record Attempts: 1 Following insertion, line sutured, dressing applied and Biopatch. Post procedure assessment: blood return through all ports, free fluid flow and no air  Patient tolerated the procedure well with no immediate complications.

## 2023-07-03 NOTE — Transfer of Care (Signed)
 Immediate Anesthesia Transfer of Care Note  Patient: [name]  Procedure(s) Performed: CORONARY ARTERY BYPASS GRAFTING X 4, USING LEFT INTERNAL MAMMARY ARTERY AND ENDOSCOPICALLY HARVESTED RIGHT SAPHENOUS VEIN GRAFT (Chest) TRANSESOPHAGEAL ECHOCARDIOGRAM (TEE)  Patient Location: ICU  Anesthesia Type:General  Level of Consciousness: sedated and Patient remains intubated per anesthesia plan  Airway & Oxygen Therapy: Patient remains intubated per anesthesia plan and Patient placed on Ventilator (see vital sign flow sheet for setting)  Post-op Assessment: Report given to RN and Post -op Vital signs reviewed and stable  Post vital signs: Reviewed and stable  Last Vitals:  Vitals Value Taken Time  BP 106/79 07/03/23 1323  Temp    Pulse 80 07/03/23 1325  Resp 16 07/03/23 1325  SpO2 99 % 07/03/23 1325  Vitals shown include unfiled device data.  Last Pain:  Vitals:   07/03/23 0730  TempSrc:   PainSc: 0-No pain      Patients Stated Pain Goal: 0 (07/02/23 1831)  Complications: No notable events documented.  Patient transported to ICU with standard monitors (HR, BP, SPO2, RR) and emergency drugs/equipment. Controlled ventilation maintained via ambu bag. Report given to bedside RN and respiratory therapist. Pt connected to ICU monitor and ventilator. All questions answered and vital signs stable before leaving

## 2023-07-03 NOTE — Interval H&P Note (Signed)
 History and Physical Interval Note:  07/03/2023 7:57 AM  [name]  has presented today for surgery, with the diagnosis of CAD.  The various methods of treatment have been discussed with the patient and family. After consideration of risks, benefits and other options for treatment, the patient has consented to  Procedure(s): CORONARY ARTERY BYPASS GRAFTING (CABG) (N/A) TRANSESOPHAGEAL ECHOCARDIOGRAM (TEE) (N/A) as a surgical intervention.  The patient's history has been reviewed, patient examined, no change in status, stable for surgery.  I have reviewed the patient's chart and labs.  Questions were answered to the patient's satisfaction.     [name]

## 2023-07-03 NOTE — Anesthesia Procedure Notes (Signed)
 Arterial Line Insertion Start/End1/01/2024 8:14 AM, 07/03/2023 8:18 AM Performed by: Lucious Debby BRAVO, MD, anesthesiologist  Patient location: Pre-op. Preanesthetic checklist: patient identified, IV checked, risks and benefits discussed, surgical consent, monitors and equipment checked, pre-op evaluation, timeout performed and anesthesia consent Lidocaine  1% used for infiltration and patient sedated Left, radial was placed Catheter size: 20 G Hand hygiene performed   Attempts: 3 (Previous attempts by CRNA unsuccessful on same vessel) Procedure performed using ultrasound guided technique. Ultrasound Notes:anatomy identified, needle tip was noted to be adjacent to the nerve/plexus identified, no ultrasound evidence of intravascular and/or intraneural injection and image(s) printed for medical record Following insertion, dressing applied and Biopatch. Post procedure assessment: unchanged and normal  Patient tolerated the procedure well with no immediate complications.

## 2023-07-03 NOTE — Plan of Care (Signed)

## 2023-07-03 NOTE — Anesthesia Procedure Notes (Signed)
 Procedure Name: Intubation Date/Time: 07/03/2023 8:44 AM  Performed by: Lamar Lucie DASEN, CRNAPre-anesthesia Checklist: Patient identified, Emergency Drugs available, Suction available and Patient being monitored Patient Re-evaluated:Patient Re-evaluated prior to induction Oxygen Delivery Method: Circle system utilized Preoxygenation: Pre-oxygenation with 100% oxygen Induction Type: IV induction Ventilation: Mask ventilation without difficulty Laryngoscope Size: Mac and 3 Grade View: Grade I Tube type: Oral Tube size: 7.5 mm Number of attempts: 1 Airway Equipment and Method: Stylet and Oral airway Placement Confirmation: ETT inserted through vocal cords under direct vision, positive ETCO2 and breath sounds checked- equal and bilateral Secured at: 21 cm Tube secured with: Tape Dental Injury: Teeth and Oropharynx as per pre-operative assessment

## 2023-07-03 NOTE — Brief Op Note (Signed)
 06/23/2023 - 07/03/2023  1:40 PM  PATIENT:  [name]  68 y.o. female  PRE-OPERATIVE DIAGNOSIS:  CORONARY ARTERY DISEASE  POST-OPERATIVE DIAGNOSIS:  CORONARY ARTERY DISEASE  PROCEDURE:  Procedure(s):  CORONARY ARTERY BYPASS GRAFTING x 4 -SVG to DISTAL RCA -SEQ SVG to OM and RAMUS INTERMEDIATE  TRANSESOPHAGEAL ECHOCARDIOGRAM (TEE) (N/A)  ENDOSCOPIC HARVEST GREATER SAPHENOUS -Right Leg Vein harvest time: 40 min Vein prep time: 15 min  SURGEON:  Surgeons and Role:    * Kerrin Elspeth BROCKS, MD - Primary  PHYSICIAN ASSISTANT: Rocky Shad PA-C  ASSISTANTS: Jerel Fees RNFA   ANESTHESIA:   general  EBL: 500 ml  BLOOD ADMINISTERED: 1 unit CC PRBC and   CC CELLSAVER  DRAINS:  Left Pleural Chest Tube, Mediastinal Chest Drain    LOCAL MEDICATIONS USED:  NONE  SPECIMEN:  No Specimen  DISPOSITION OF SPECIMEN:  N/A  COUNTS:  YES  TOURNIQUET:  * No tourniquets in log *  DICTATION: .Dragon Dictation  PLAN OF CARE: Admit to inpatient   PATIENT DISPOSITION:  ICU - intubated and hemodynamically stable.   Delay start of Pharmacological VTE agent (>24hrs) due to surgical blood loss or risk of bleeding: yes

## 2023-07-04 ENCOUNTER — Inpatient Hospital Stay (HOSPITAL_COMMUNITY): Payer: Medicare Other

## 2023-07-04 ENCOUNTER — Encounter (HOSPITAL_COMMUNITY): Payer: Self-pay | Admitting: Thoracic Surgery (Cardiothoracic Vascular Surgery)

## 2023-07-04 DIAGNOSIS — I169 Hypertensive crisis, unspecified: Secondary | ICD-10-CM | POA: Diagnosis not present

## 2023-07-04 DIAGNOSIS — Z951 Presence of aortocoronary bypass graft: Secondary | ICD-10-CM

## 2023-07-04 LAB — BASIC METABOLIC PANEL
Anion gap: 11 (ref 5–15)
Anion gap: 11 (ref 5–15)
BUN: 11 mg/dL (ref 8–23)
BUN: 8 mg/dL (ref 8–23)
CO2: 20 mmol/L — ABNORMAL LOW (ref 22–32)
CO2: 23 mmol/L (ref 22–32)
Calcium: 8.4 mg/dL — ABNORMAL LOW (ref 8.9–10.3)
Calcium: 8.7 mg/dL — ABNORMAL LOW (ref 8.9–10.3)
Chloride: 106 mmol/L (ref 98–111)
Chloride: 102 mmol/L (ref 98–111)
Creatinine, Ser: 0.65 mg/dL (ref 0.44–1.00)
Creatinine, Ser: 0.8 mg/dL (ref 0.44–1.00)
GFR, Estimated: >60 mL/min (ref >60)
GFR, Estimated: >60 mL/min (ref >60)
Glucose, Bld: 97 mg/dL (ref 70–99)
Glucose, Bld: 157 mg/dL — ABNORMAL HIGH (ref 70–99)
Potassium: 3.9 mmol/L (ref 3.5–5.1)
Potassium: 3.5 mmol/L (ref 3.5–5.1)
Sodium: 137 mmol/L (ref 135–145)
Sodium: 136 mmol/L (ref 135–145)

## 2023-07-04 LAB — CBC
HCT: 26 % — ABNORMAL LOW (ref 36.0–46.0)
HCT: 29.6 % — ABNORMAL LOW (ref 36.0–46.0)
Hemoglobin: 8.7 g/dL — ABNORMAL LOW (ref 12.0–15.0)
Hemoglobin: 9.7 g/dL — ABNORMAL LOW (ref 12.0–15.0)
MCH: 28.6 pg (ref 26.0–34.0)
MCH: 28.2 pg (ref 26.0–34.0)
MCHC: 33.5 g/dL (ref 30.0–36.0)
MCHC: 32.8 g/dL (ref 30.0–36.0)
MCV: 85.5 fL (ref 80.0–100.0)
MCV: 86 fL (ref 80.0–100.0)
Platelets: 155 10*3/uL (ref 150–400)
Platelets: 203 10*3/uL (ref 150–400)
RBC: 3.04 MIL/uL — ABNORMAL LOW (ref 3.87–5.11)
RBC: 3.44 MIL/uL — ABNORMAL LOW (ref 3.87–5.11)
RDW: 14.6 % (ref 11.5–15.5)
RDW: 14.5 % (ref 11.5–15.5)
WBC: 9 10*3/uL (ref 4.0–10.5)
WBC: 12.8 10*3/uL — ABNORMAL HIGH (ref 4.0–10.5)
nRBC: 0 % (ref 0.0–0.2)
nRBC: 0 % (ref 0.0–0.2)

## 2023-07-04 LAB — GLUCOSE, CAPILLARY
Glucose-Capillary: 106 mg/dL — ABNORMAL HIGH (ref 70–99)
Glucose-Capillary: 107 mg/dL — ABNORMAL HIGH (ref 70–99)
Glucose-Capillary: 138 mg/dL — ABNORMAL HIGH (ref 70–99)
Glucose-Capillary: 87 mg/dL (ref 70–99)
Glucose-Capillary: 92 mg/dL (ref 70–99)
Glucose-Capillary: 97 mg/dL (ref 70–99)

## 2023-07-04 LAB — HEMOGLOBIN A1C
Hgb A1c MFr Bld: 6.2 % — ABNORMAL HIGH (ref 4.8–5.6)
Mean Plasma Glucose: 131 mg/dL

## 2023-07-04 LAB — MAGNESIUM
Magnesium: 2.4 mg/dL (ref 1.7–2.4)
Magnesium: 2.1 mg/dL (ref 1.7–2.4)

## 2023-07-04 MED ORDER — LOSARTAN POTASSIUM 25 MG PO TABS
25.0000 mg | ORAL_TABLET | ORAL | Status: AC
  Administered 2023-07-04: 25 mg via ORAL
  Filled 2023-07-04: qty 1

## 2023-07-04 MED ORDER — INSULIN ASPART 100 UNIT/ML IJ SOLN
0.0000 [IU] | INTRAMUSCULAR | Status: DC
  Administered 2023-07-05: 2 [IU] via SUBCUTANEOUS

## 2023-07-04 MED ORDER — POTASSIUM CHLORIDE 10 MEQ/50ML IV SOLN
10.0000 meq | INTRAVENOUS | Status: AC
  Administered 2023-07-04 (×3): 10 meq via INTRAVENOUS
  Filled 2023-07-04 (×3): qty 50

## 2023-07-04 MED ORDER — INSULIN GLARGINE-YFGN 100 UNIT/ML ~~LOC~~ SOLN
10.0000 [IU] | Freq: Once | SUBCUTANEOUS | Status: DC
  Filled 2023-07-04: qty 0.1

## 2023-07-04 MED ORDER — METOPROLOL TARTRATE 25 MG PO TABS: 25.0000 mg | ORAL_TABLET | Freq: Two times a day (BID) | ORAL | Status: DC

## 2023-07-04 MED ORDER — POTASSIUM CHLORIDE CRYS ER 10 MEQ PO TBCR
10.0000 meq | EXTENDED_RELEASE_TABLET | Freq: Once | ORAL | Status: AC
  Administered 2023-07-04: 10 meq via ORAL
  Filled 2023-07-04: qty 1

## 2023-07-04 MED ORDER — METOPROLOL TARTRATE 12.5 MG HALF TABLET
12.5000 mg | ORAL_TABLET | Freq: Two times a day (BID) | ORAL | Status: DC
  Administered 2023-07-04: 12.5 mg via ORAL
  Filled 2023-07-04: qty 1

## 2023-07-04 MED ORDER — METOPROLOL TARTRATE 50 MG PO TABS: 50.0000 mg | ORAL_TABLET | Freq: Two times a day (BID) | ORAL | Status: DC

## 2023-07-04 MED ORDER — INSULIN GLARGINE-YFGN 100 UNIT/ML ~~LOC~~ SOLN
10.0000 [IU] | Freq: Once | SUBCUTANEOUS | Status: AC
  Administered 2023-07-04: 10 [IU] via SUBCUTANEOUS
  Filled 2023-07-04: qty 0.1

## 2023-07-04 MED ORDER — FUROSEMIDE 10 MG/ML IJ SOLN
20.0000 mg | Freq: Once | INTRAMUSCULAR | Status: AC
  Administered 2023-07-04: 20 mg via INTRAVENOUS
  Filled 2023-07-04: qty 2

## 2023-07-04 MED ORDER — LOSARTAN POTASSIUM 50 MG PO TABS
50.0000 mg | ORAL_TABLET | Freq: Every day | ORAL | Status: DC
Start: 2023-07-05 — End: 2023-07-05

## 2023-07-04 MED ORDER — INSULIN ASPART 100 UNIT/ML IJ SOLN: 0.0000 [IU] | INTRAMUSCULAR | Status: DC

## 2023-07-04 MED ORDER — INSULIN GLARGINE-YFGN 100 UNIT/ML ~~LOC~~ SOLN
20.0000 [IU] | Freq: Once | SUBCUTANEOUS | Status: DC
  Filled 2023-07-04: qty 0.2

## 2023-07-04 MED ORDER — LOSARTAN POTASSIUM 25 MG PO TABS
12.5000 mg | ORAL_TABLET | Freq: Every day | ORAL | Status: DC
  Administered 2023-07-04: 12.5 mg via ORAL
  Filled 2023-07-04: qty 1

## 2023-07-04 MED ORDER — INSULIN GLARGINE-YFGN 100 UNIT/ML ~~LOC~~ SOLN: 20.0000 [IU] | Freq: Every day | SUBCUTANEOUS | Status: DC

## 2023-07-04 MED ORDER — INSULIN GLARGINE-YFGN 100 UNIT/ML ~~LOC~~ SOLN
10.0000 [IU] | Freq: Every day | SUBCUTANEOUS | Status: DC
  Filled 2023-07-04: qty 0.1

## 2023-07-04 MED ORDER — ENOXAPARIN SODIUM 40 MG/0.4ML IJ SOSY
40.0000 mg | PREFILLED_SYRINGE | Freq: Every day | INTRAMUSCULAR | Status: DC
  Administered 2023-07-04 – 2023-07-09 (×6): 40 mg via SUBCUTANEOUS
  Filled 2023-07-04 (×6): qty 0.4

## 2023-07-04 MED ORDER — METOPROLOL TARTRATE 25 MG/10 ML ORAL SUSPENSION: 12.5000 mg | Freq: Two times a day (BID) | ORAL | Status: DC

## 2023-07-04 MED FILL — Lidocaine HCl Local Soln Prefilled Syringe 100 MG/5ML (2%): INTRAMUSCULAR | Qty: 5 | Status: AC

## 2023-07-04 MED FILL — Electrolyte-R (PH 7.4) Solution: INTRAVENOUS | Qty: 4000 | Status: AC

## 2023-07-04 MED FILL — Sodium Bicarbonate IV Soln 8.4%: INTRAVENOUS | Qty: 100 | Status: AC

## 2023-07-04 MED FILL — Mannitol IV Soln 20%: INTRAVENOUS | Qty: 500 | Status: AC

## 2023-07-04 MED FILL — Calcium Chloride Inj 10%: INTRAVENOUS | Qty: 10 | Status: AC

## 2023-07-04 MED FILL — Heparin Sodium (Porcine) Inj 1000 Unit/ML: INTRAMUSCULAR | Qty: 10 | Status: AC

## 2023-07-04 MED FILL — Sodium Chloride IV Soln 0.9%: INTRAVENOUS | Qty: 2000 | Status: AC

## 2023-07-04 NOTE — Discharge Instructions (Signed)

## 2023-07-04 NOTE — Progress Notes (Signed)
 EVENING ROUNDS NOTE :     [address]       [place] [address]             [phone]                 1 Day Post-Op Procedure(s) (LRB): CORONARY ARTERY BYPASS GRAFTING X 4, USING LEFT INTERNAL MAMMARY ARTERY AND ENDOSCOPICALLY HARVESTED RIGHT SAPHENOUS VEIN GRAFT (N/A) TRANSESOPHAGEAL ECHOCARDIOGRAM (TEE) (N/A)   Total Length of Stay:  LOS: 6 days  Events:   No events Stable day     BP 125/79   Pulse 80   Temp 97.6 F (36.4 C) (Oral)   Resp 13   Ht 5' 1 (1.549 m)   Wt 50.6 kg   SpO2 95%   BMI 21.08 kg/m   PAP: (17-44)/(6-20) 44/20 CO:  [2.4 L/min-3.5 L/min] 2.8 L/min CI:  [1.6 L/min/m2-2.38 L/min/m2] 1.9 L/min/m2      sodium chloride  Stopped (07/04/23 1802)    ceFAZolin  (ANCEF ) IV Stopped (07/04/23 1409)   potassium chloride       I/O last 3 completed shifts: In: 4462.9 [P.O.:50; I.V.:2218.6; Blood:235; IV Piggyback:1959.3] Out: 5260 [Urine:4330; Chest Tube:930]      Latest Ref Rng & Units 07/04/2023    4:15 PM 07/04/2023    3:58 AM 07/03/2023    7:43 PM  CBC  WBC 4.0 - 10.5 K/uL 12.8  9.0    Hemoglobin 12.0 - 15.0 g/dL 9.7  8.7  9.5   Hematocrit 36.0 - 46.0 % 29.6  26.0  28.0   Platelets 150 - 400 K/uL 203  155         Latest Ref Rng & Units 07/04/2023    4:15 PM 07/04/2023    3:58 AM 07/03/2023    7:43 PM  BMP  Glucose 70 - 99 mg/dL 842  97    BUN 8 - 23 mg/dL 8  11    Creatinine 9.55 - 1.00 mg/dL 9.19  9.34    Sodium 864 - 145 mmol/L 136  137  138   Potassium 3.5 - 5.1 mmol/L 3.5  3.9  4.5   Chloride 98 - 111 mmol/L 102  106    CO2 22 - 32 mmol/L 23  20    Calcium  8.9 - 10.3 mg/dL 8.7  8.4      ABG    Component Value Date/Time   PHART 7.321 (L) 07/03/2023 1943   PCO2ART 46.7 07/03/2023 1943   PO2ART 141 (H) 07/03/2023 1943   HCO3 24.1 07/03/2023 1943   TCO2 26 07/03/2023 1943   ACIDBASEDEF 2.0 07/03/2023 1943   O2SAT 99 07/03/2023 1943       Linnie Rayas, MD 07/04/2023 7:06 PM

## 2023-07-04 NOTE — TOC Initial Note (Signed)
 Transition of Care [facility]) - Initial/Assessment Note    Patient Details  Name: [name] MRN: [id number] Date of Birth: 01-03-1956  [facility]) CM/SW Contact:    Justina Delcia Czar, RN Phone Number:[phone] 07/04/2023, 3:58 PM  Clinical Narrative:                 CM offered choice for HH, (medicare.gov list provided to pt and placed chart) States she lives with her niece at [place].  She was independent pta. Will continue to follow for dc needs.   Expected Discharge Plan: Home w Home Health Services Barriers to Discharge: Continued Medical Work up   Patient Goals and CMS Choice Patient states their goals for this hospitalization and ongoing recovery are:: plan to return to motel with support of niece. CMS Medicare.gov Compare Post Acute Care list provided to:: Patient Choice offered to / list presented to : Patient      Expected Discharge Plan and Services In-house Referral: NA Discharge Planning Services: CM Consult Post Acute Care Choice: Home Health Living arrangements for the past 2 months: Hotel/Motel                                      Prior Living Arrangements/Services Living arrangements for the past 2 months: Hotel/Motel Lives with:: Relatives Patient language and need for interpreter reviewed:: Yes Do you feel safe going back to the place where you live?: Yes      Need for Family Participation in Patient Care: Yes (Comment) Care giver support system in place?: Yes (comment)   Criminal Activity/Legal Involvement Pertinent to Current Situation/Hospitalization: No - Comment as needed  Activities of Daily Living   ADL Screening (condition at time of admission) Independently performs ADLs?: Yes (appropriate for developmental age) Is the patient deaf or have difficulty hearing?: No Does the patient have difficulty seeing, even when wearing glasses/contacts?: No Does the patient have difficulty concentrating, remembering, or making  decisions?: No  Permission Sought/Granted Permission sought to share information with : Family Supports, PCP    Share Information with NAME: Royetta Louder        Permission granted to share info w Contact Information: [phone]  Emotional Assessment Appearance:: Appears stated age Attitude/Demeanor/Rapport: Engaged Affect (typically observed): Appropriate Orientation: : Oriented to Self, Oriented to Place, Oriented to  Time, Oriented to Situation Alcohol / Substance Use: Not Applicable Psych Involvement: No (comment)  Admission diagnosis:  Hypertensive crisis [I16.9] Hypertensive urgency [I16.0] Unstable angina (HCC) [I20.0] Patient Active Problem List   Diagnosis Date Noted   S/P CABG x 4 07/04/2023   Atherosclerosis of native coronary artery of native heart without angina pectoris 07/01/2023   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) 07/01/2023   Benign hypertension with coincident congestive heart failure (HCC) 07/01/2023   Unstable angina (HCC) 06/28/2023   HFrEF (heart failure with reduced ejection fraction) (HCC) 06/25/2023   Elevated troponin 06/25/2023   Hypertensive crisis 06/24/2023   Hypertensive urgency 06/24/2023   Intractable nausea and vomiting 12/03/2022   Prolonged QT interval 12/03/2022   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   Numbness and tingling of left hand 02/03/2021   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   Sepsis due to undetermined organism (HCC) 10/10/2020   Enteritis of infectious origin 10/09/2020   Diabetes mellitus type 2 in nonobese (HCC) 10/09/2020   Bilateral impacted cerumen 04/02/2019   Conductive hearing loss,  bilateral 04/02/2019   Nausea and vomiting 05/17/2018   Enteritis 06/19/2017   Abdominal pain 06/18/2017   Hyperlipidemia 06/18/2017   Elevated glucose 06/18/2017   Need for hepatitis C screening test 05/23/2017   Breast pain, left 01/05/2016   Left ankle sprain 01/20/2015   Gastroenteritis  11/08/2014   Malaise 10/14/2014   Hypokalemia 10/14/2014   Essential hypertension    Bilateral wrist pain 10/06/2014   Hot flashes, menopausal 10/06/2014   Cerebral aneurysm without rupture 08/20/2014   TIA (transient ischemic attack) 08/19/2014   Accelerated hypertension 02/24/2013   Depression 02/24/2013   PCP:  Celestia Rosaline SQUIBB, NP Pharmacy:   [facility] [phone] - [place], [place] - [address] AT [facility] [address] [place] [address] Phone: [phone] Fax: [phone]  [facility] [place] [facility] [address] [place] [address] Phone: [phone] Fax: [phone]  [facility] - [place], [name] - [address] [name] [address] Phone: [phone] Fax: [phone]     Social Drivers of Health (SDOH) Social History: SDOH Screenings   Food Insecurity: No Food Insecurity (06/24/2023)  Housing: Low Risk  (06/24/2023)  Transportation Needs: No Transportation Needs (06/24/2023)  Utilities: At Risk (06/24/2023)  Alcohol Screen: Low Risk  (05/02/2023)  Depression (PHQ2-9): Low Risk  (05/02/2023)  Financial Resource Strain: Low Risk  (05/02/2023)  Physical Activity: Inactive (05/02/2023)  Social Connections: Moderately Integrated (06/24/2023)  Recent Concern: Social Connections - Moderately Isolated (05/02/2023)  Stress: Stress Concern Present (05/02/2023)  Tobacco Use: Medium Risk (07/03/2023)  Health Literacy: Adequate Health Literacy (05/02/2023)   SDOH Interventions: Utilities Interventions: Community Resources Provided   Readmission Risk Interventions     No data to display

## 2023-07-04 NOTE — Progress Notes (Signed)
 Per d/w Dr. Gasper Lloyd, updating round to CHF given 2H closed unit rounding. Team A notified.

## 2023-07-04 NOTE — Progress Notes (Addendum)
 Advanced Heart Failure Rounding Note  Cardiologist: Aleene Passe, MD  Chief Complaint: s/p CABG Subjective:    S/p CABG x4 1/8  Feels ok this morning just tired. Becki now out. Has stood at the side of the bed today. Denies CP and SOB.   Objective:   Weight Range: 50.6 kg Body mass index is 21.08 kg/m.   Vital Signs:   Temp:  [95.5 F (35.3 C)-99.9 F (37.7 C)] 98.8 F (37.1 C) (01/09 0930) Pulse Rate:  [74-154] 78 (01/09 1100) Resp:  [11-35] 20 (01/09 1100) BP: (106-144)/(77-88) 140/81 (01/09 1100) SpO2:  [76 %-100 %] 93 % (01/09 1100) Arterial Line BP: (95-199)/(49-85) 150/67 (01/09 0900) FiO2 (%):  [36 %-50 %] 36 % (01/08 1833) Weight:  [50.6 kg] 50.6 kg (01/09 0600) Last BM Date : 07/02/23  Weight change: Filed Weights   06/27/23 0604 07/03/23 0730 07/04/23 0600  Weight: 48.2 kg 48.5 kg 50.6 kg   Intake/Output:   Intake/Output Summary (Last 24 hours) at 07/04/2023 1144 Last data filed at 07/04/2023 1100 Gross per 24 hour  Intake 3477.78 ml  Output 3260 ml  Net 217.78 ml    Physical Exam  General:  frail appearing.  No respiratory difficulty HEENT: normal Neck: supple. JVD ~7 cm. Carotids 2+ bilat; no bruits. No lymphadenopathy or thyromegaly appreciated. RIJ introducer Cor: PMI nondisplaced. Regular rate & rhythm. No rubs, gallops or murmurs. +CT Lungs: clear Abdomen: soft, nontender, nondistended. No hepatosplenomegaly. No bruits or masses. Good bowel sounds. Extremities: no cyanosis, clubbing, rash, edema  GU: +foley Neuro: alert & oriented x 3, cranial nerves grossly intact. moves all 4 extremities w/o difficulty. Affect pleasant.   Telemetry    NSR 80s (Personally reviewed)    EKG    NSR 81 bpm  Labs    CBC Recent Labs    07/03/23 1932 07/03/23 1943 07/04/23 0358  WBC 10.4  --  9.0  HGB 9.3* 9.5* 8.7*  HCT 28.2* 28.0* 26.0*  MCV 86.2  --  85.5  PLT 208  --  155   Basic Metabolic Panel Recent Labs    98/91/74 1932  07/03/23 1943 07/04/23 0358  NA 137 138 137  K 4.4 4.5 3.9  CL 105  --  106  CO2 22  --  20*  GLUCOSE 93  --  97  BUN 13  --  11  CREATININE 0.54  --  0.65  CALCIUM  8.6*  --  8.4*  MG 3.3*  --  2.4   Liver Function Tests No results for input(s): AST, ALT, ALKPHOS, BILITOT, PROT, ALBUMIN  in the last 72 hours. No results for input(s): LIPASE, AMYLASE in the last 72 hours. Cardiac Enzymes No results for input(s): CKTOTAL, CKMB, CKMBINDEX, TROPONINI in the last 72 hours.  BNP: BNP (last 3 results) No results for input(s): BNP in the last 8760 hours.  ProBNP (last 3 results) No results for input(s): PROBNP in the last 8760 hours.   D-Dimer No results for input(s): DDIMER in the last 72 hours. Hemoglobin A1C Recent Labs    07/03/23 0458  HGBA1C 6.2*   Fasting Lipid Panel No results for input(s): CHOL, HDL, LDLCALC, TRIG, CHOLHDL, LDLDIRECT in the last 72 hours. Thyroid  Function Tests No results for input(s): TSH, T4TOTAL, T3FREE, THYROIDAB in the last 72 hours.  Invalid input(s): FREET3  Other results:   Imaging    DG Chest Port 1 View Result Date: 07/04/2023 CLINICAL DATA:  Status post CABG. EXAM: PORTABLE CHEST 1 VIEW COMPARISON:  Chest radiograph dated 07/03/2023. FINDINGS: Interval removal of endotracheal and enteric tubes. The Swan-Ganz catheter has been advanced with tip likely in the right pulmonary artery. Inferiorly accessed support tubes in similar position. Interval increase in the size of left pleural effusion and associated left lung base atelectasis. Trace right pleural effusion, new since the prior radiograph. No pneumothorax. Stable cardiomediastinal silhouette. Median sternotomy wires and CABG vascular clips. No acute osseous pathology. IMPRESSION: 1. Interval removal of endotracheal and enteric tubes. 2. Interval increase in the size of left pleural effusion and associated left lung base atelectasis.  Electronically Signed   By: Vanetta Chou M.D.   On: 07/04/2023 09:34   DG Chest Port 1 View Result Date: 07/03/2023 CLINICAL DATA:  Status post CABG. EXAM: PORTABLE CHEST 1 VIEW COMPARISON:  Chest radiograph dated 07/02/2023. FINDINGS: Endotracheal tube with tip approximately 2 cm above the carina. The tube can be retracted by 2 cm for optimal positioning. Enteric tube with side port just above the GE junction. Recommend further advancing by additional 7 cm. Right IJ Swan-Ganz catheter with tip over the left hilum likely in the region of the main pulmonary trunk. Inferiorly accessed drains. Probable trace left pleural effusion and left lung base atelectasis. The right lung is clear. No pneumothorax. Mild enlargement of the cardiopericardial silhouette. Median sternotomy wires and CABG vascular clips. IMPRESSION: 1. Endotracheal tube with tip approximately 2 cm above the carina. 2. Enteric tube with side port just above the GE junction. Recommend further advancing by additional 7 cm. 3. Probable trace left pleural effusion and left lung base atelectasis. Electronically Signed   By: Vanetta Chou M.D.   On: 07/03/2023 14:32     Medications:     Scheduled Medications:  acetaminophen   1,000 mg Oral Q6H   Or   acetaminophen  (TYLENOL ) oral liquid 160 mg/5 mL  1,000 mg Per Tube Q6H   aspirin  EC  325 mg Oral Daily   Or   aspirin   324 mg Per Tube Daily   bisacodyl   10 mg Oral Daily   Or   bisacodyl   10 mg Rectal Daily   Chlorhexidine  Gluconate Cloth  6 each Topical Daily   docusate sodium   200 mg Oral Daily   DULoxetine   30 mg Oral Daily   enoxaparin  (LOVENOX ) injection  40 mg Subcutaneous QHS   insulin  aspart  0-24 Units Subcutaneous Q4H   [START ON 07/05/2023] insulin  glargine-yfgn  10 Units Subcutaneous Daily   metoCLOPramide  (REGLAN ) injection  10 mg Intravenous Q6H   metoprolol  tartrate  12.5 mg Oral BID   Or   metoprolol  tartrate  12.5 mg Per Tube BID   [START ON 07/05/2023]  pantoprazole   40 mg Oral Daily   pantoprazole  (PROTONIX ) IV  40 mg Intravenous QHS   rosuvastatin   40 mg Oral Daily   sodium chloride  flush  3 mL Intravenous Q12H    Infusions:  sodium chloride      sodium chloride  1 mL/hr at 07/04/23 1100   sodium chloride  10 mL/hr at 07/04/23 1100    ceFAZolin  (ANCEF ) IV Stopped (07/04/23 9380)   insulin  0.3 Units/hr (07/04/23 1100)   lactated ringers      lactated ringers  20 mL/hr at 07/04/23 1100    PRN Medications: sodium chloride , dextrose , melatonin, metoprolol  tartrate, midazolam , morphine  injection, ondansetron  (ZOFRAN ) IV, oxyCODONE , sodium chloride  flush, traMADol   Patient Profile   [name] is a 68 y.o. female with HTN, HLD, DM with neuropathy, TIA, family history of CAD, L brain aneurysm. Admitted  with chest pain and hypertensive emergency 06/27/22.   Assessment/Plan  CAD  - LHC 1/25 showed EF 40-45%, ostial and prox 80% LAD stenosis, LCx with 70% stenosis with mid-distal 95% stenosis, 80% stenosis of ramus intermediate and mid RCA 80% stenosis.  - now s/p CABG x4 (L internal mammary artery to LAD, saphenous vein graft to distal R coronary, sequential saphenous vein graft to ramus intermedius and obtuse marginal) - Continue ASA and statin - Plan for plavix  once CT and pacing wires out - Denies CP  HFmrEF - LHC 1/25 as above - TEE 07/03/23: EF 45-50%, mild concentric LVH - Echo 12/24: EF 40-45%, LV with RWMA, GIDD, LA severely dilated, trivial MR - Volume mildly elevated, weight up about 5lbs from pre-op. Got 20 IV lasix  earlier today by CTS, will monitor UOP for additional needs. So far about 1L out - Restart home farxiga  when foley removed (tomorrow) - Start losartan  12.5 mg daily (on losartan -hydrochlorothiazide  at home)  - Consider spiro 12.5 mg daily - strict I&O, daily weights    HTN emergency - on admission 2/2 running out of meds for 3 months - Continue metoprolol  12.5 mg BID  HLD - LDL 178 - Continue statin -  Will need referral to lipid clinic at discharge - Plan to repeat lipid panel in ~3 months  Plan for CIR at discharge  Length of Stay: 6  Beckey LITTIE Coe, NP  07/04/2023, 11:44 AM  Advanced Heart Failure Team Pager [phone] (M-F; 7a - 5p)  Please contact [facility] for night-coverage after hours (5p -7a ) and weekends on [email or web address]

## 2023-07-04 NOTE — Discharge Summary (Addendum)
 [address]       [place] [address]             [phone]    Physician Discharge Summary  Patient ID: [name] MRN: [id number] DOB/AGE: [birth year] 68 y.o.  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) 07/01/2023   Benign hypertension with coincident congestive heart failure (HCC) 07/01/2023   Unstable angina (HCC) 06/28/2023   HFrEF (heart failure with reduced ejection fraction) (HCC) 06/25/2023   Elevated troponin 06/25/2023   Hypertensive crisis 06/24/2023   Hypertensive urgency 06/24/2023   Intractable nausea and vomiting 12/03/2022   Prolonged QT interval 12/03/2022   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   Numbness and tingling of left hand 02/03/2021   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   Sepsis due to undetermined organism (HCC) 10/10/2020   Enteritis of infectious origin 10/09/2020   Diabetes mellitus type 2 in nonobese (HCC) 10/09/2020   Bilateral impacted cerumen 04/02/2019   Conductive hearing loss, bilateral 04/02/2019   Nausea and vomiting 05/17/2018   Enteritis 06/19/2017   Abdominal pain 06/18/2017   Hyperlipidemia 06/18/2017   Elevated glucose 06/18/2017   Need for hepatitis C screening test 05/23/2017   Breast pain, left 01/05/2016   Left ankle sprain 01/20/2015   Gastroenteritis 11/08/2014   Malaise 10/14/2014   Hypokalemia 10/14/2014   Essential hypertension    Bilateral wrist pain 10/06/2014   Hot flashes, menopausal 10/06/2014   Cerebral aneurysm without rupture 08/20/2014   TIA (transient ischemic attack) 08/19/2014   Accelerated hypertension 02/24/2013   Depression 02/24/2013   Discharge Diagnoses:  Patient Active Problem List   Diagnosis Date Noted   S/P CABG x 4  07/04/2023   Atherosclerosis of native coronary artery of native heart without angina pectoris 07/01/2023   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) 07/01/2023   Benign hypertension with coincident congestive heart failure (HCC) 07/01/2023   Unstable angina (HCC) 06/28/2023   HFrEF (heart failure with reduced ejection fraction) (HCC) 06/25/2023   Elevated troponin 06/25/2023   Hypertensive crisis 06/24/2023   Hypertensive urgency 06/24/2023   Intractable nausea and vomiting 12/03/2022   Prolonged QT interval 12/03/2022   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   Numbness and tingling of left hand 02/03/2021   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   Sepsis due to undetermined organism (HCC) 10/10/2020   Enteritis of infectious origin 10/09/2020   Diabetes mellitus type 2 in nonobese (HCC) 10/09/2020   Bilateral impacted cerumen 04/02/2019   Conductive hearing loss, bilateral 04/02/2019   Nausea and vomiting 05/17/2018   Enteritis 06/19/2017   Abdominal pain 06/18/2017   Hyperlipidemia 06/18/2017   Elevated glucose 06/18/2017   Need for hepatitis C screening test 05/23/2017   Breast pain, left 01/05/2016   Left ankle sprain 01/20/2015   Gastroenteritis 11/08/2014   Malaise 10/14/2014   Hypokalemia 10/14/2014   Essential hypertension    Bilateral wrist pain 10/06/2014   Hot flashes, menopausal 10/06/2014   Cerebral aneurysm without rupture 08/20/2014   TIA (transient ischemic attack) 08/19/2014   Accelerated hypertension 02/24/2013   Depression 02/24/2013   Discharged Condition: good  History of Present Illness:  [name] is a 68 year old female with a past medical history of hypertension, hyperlipidemia, DM with neuropathy,  GERD,  small left brain aneurysm, TIA (patient not aware of this), family history of CAD (father died of MI in his 50's),anxiety who presented to [facility] ED on 06/24/2023 with complaints of waxing and waning chest pain, in  the setting of a hypertensive emergency. Per medical documentation, she had chest pain, worse with palpation on exam. Patient notes she also had one episode of nausea and vomiting but denied diaphoresis and LE edema. EKG showed sinus rhythm, LAFB, lateral TWI. Initial Troponin I (high sensitivity)  was 81 and max's at 94. CXR showed no acute abnormality. She was transferred to [facility] for further evaluation and treatment.   Hospital Course:Echo done 06/24/2023 showed LVEF 40-45%, regional wall motion abnormalities, trivial MR, no AI or AS, aortic root is normal in size and structure, and no pericardial effusion. Cardiac catheterization done 06/27/2023 showed LVEF 40-45%, ostial and proximal 80% LAD stenosis, left Circumflex with a 70% stenosis with mid to distal 95% stenosis, 80% stenosis of Ramus Intermediate, and mid RCA 80% stenosis.  Cardiac surgery consultation was requested for bypass evaluation.  At that time the patient reports that she has a had a rough past year-loss of her mother, living arrangements, and medication affordability. She has essentially put her on health on the back burner. Her niece is helping her look for a place to live, as she was in an extended stay place prior to presenting to the hospital.  Dr. Kerrin was in agreement CABG procedure was indicated.  The risks and benefits of the procedure were explained to the patient and she was agreeable to proceed.  She was taken to the operating room on 07/03/2023.  She underwent CABG x 4 utilizing LIMA to LAD, SVG to Distal RCA, and Sequential SVG to OM and Ramus Intermediate.  She also underwent endoscopic harvest of greater saphenous vein from her right leg.  She tolerated the procedure without difficulty and was taken to the SICU in stable condition.  She was extubated the evening of surgery.  She was weaned off all drips by POD #1.  She required some prn Lopressor  pushes to HTN and oral Lopressor  was resumed.  She required  transfusion in the operating room and her hemoglobin was stable at 8.7 and she did not require further transfusion.  CXR showed left lower lobe pleural effusion/atelectasis.  She was treated with IV lasix  and flutter valve was added.  She was also educated on the importance of deep breathing to help.  Her swan and arterial line were removed without difficulty.  The patient had hypertension.  She was resumed on Cozaar  and hydrochlorothiazide .  Her chest tubes and pacing wires were removed without difficulty.  She remained in NSR and felt stable for transfer to the progressive .  Blood pressure has been elevated but she has been gradually resumed on her home medications with good improvement.  She has had some sinus tachycardia and beta-blocker has been titrated.  She has been resumed on home Farxiga .  The patient developed pain along her left lateral chest region.  Chest xray was obtained and showed a left pleural effusion/atelectasis.  Interventional radiology consult was obtained for Thoracentesis.  However, ultrasound did not reveal presence of enough fluid to tap.  She was also started on a steroid taper.  She is ambulating without difficulty.  Her surgical incisions are healing without evidence of infection.  She is stable for discharge home today.  Consults:  Interventional Radiology  Significant Diagnostic Studies: angiography:   POST-OPERATIVE  DIAGNOSIS: Severe multivessel CAD: Dominance: Right Ostial and proximal LAD 80% stenosis with mild diffuse disease elsewhere Ostial and proximal RI 80% stenosis Proximal LCx 70% stenosis with mid to distal 95% stenosis after small OM 2 Mid RCA 80% Moderate reduced EF by echo 40 to 45% with anterior hypokinesis.   Normal LVEDP    RECOMMENDATION: Transfer patient to [facility] under the Cardiology service  Patient will require CVTS consultation for CABG => Anticipated discharge date to be determined. Will require additional GDMT management of  Hypertension, CAD and CHf   Will restart treatment /ACS dose of enoxaparin   With ACS presentation would not be unreasonable to load clopidogrel  300 mg and start 75 mg daily post CABG       [name], MD  Treatments: surgery:   NAME: SHALIMAR, MCCLAIN MEDICAL RECORD NO: [id number] ACCOUNT NO: [id number] DATE OF BIRTH: Feb 16, 1956 FACILITY: [facility] LOCATION: [facility] PHYSICIAN: Elspeth BROCKS. Kerrin, MD   Operative Report    DATE OF PROCEDURE: 07/03/2023   PREOPERATIVE DIAGNOSIS:  Three-vessel coronary artery disease status post MI.   POSTOPERATIVE DIAGNOSIS:  Three-vessel coronary artery disease status post MI.   PROCEDURE:  Median sternotomy, extracorporeal circulation, coronary artery bypass grafting x4 (left internal mammary artery to LAD, saphenous vein graft to distal right coronary, sequential saphenous vein graft to ramus intermedius and obtuse marginal).   Endoscopic vein harvest right leg.   SURGEON:  Elspeth BROCKS. Kerrin, MD   ASSISTANT:  Rocky Shad, PA   Discharge Exam: Blood pressure 127/86, pulse 79, temperature 97.9 F (36.6 C), temperature source Oral, resp. rate 16, height 5' 1 (1.549 m), weight 44.3 kg, SpO2 95%.  General appearance: alert, cooperative, and no distress Heart: regular rate and rhythm Lungs: diminished breath sounds left base Abdomen: soft, non-tender; bowel sounds normal; no masses,  no organomegaly Extremities: no edema present, + ecchymosis RLE Wound: clean and dry  Discharge Medications:  The patient has been discharged on:   1.Beta Blocker:  Yes [ X  ]                              No   [   ]                              If No, reason:  2.Ace Inhibitor/ARB: Yes [ X  ]                                     No  [    ]                                     If No, reason:  3.Statin:   Yes [ X  ]                  No  [   ]                  If No, reason:  4.Ecasa:  Yes  [  X ]                  No   [   ]  If No,  reason:  Patient had ACS upon admission: Yes  Plavix /P2Y12 inhibitor: Yes [ X  ]                                      No  [   ]  Discharge Instructions     Amb Referral to Cardiac Rehabilitation   Complete by: As directed    Diagnosis: CABG   CABG X ___: 4   After initial evaluation and assessments completed: Virtual Based Care may be provided alone or in conjunction with Phase 2 Cardiac Rehab based on patient barriers.: Yes   Intensive Cardiac Rehabilitation (ICR) [facility] location only OR Traditional Cardiac Rehabilitation (TCR) *If criteria for ICR are not met will enroll in TCR ([facility] only): Yes      Allergies as of 07/10/2023   No Known Allergies      Medication List     STOP taking these medications    amLODipine  10 MG tablet Commonly known as: NORVASC    loperamide  2 MG capsule Commonly known as: IMODIUM    metoprolol  succinate 25 MG 24 hr tablet Commonly known as: TOPROL -XL   oxyCODONE -acetaminophen  5-325 MG tablet Commonly known as: PERCOCET/ROXICET   potassium chloride  SA 20 MEQ tablet Commonly known as: KLOR-CON  M   promethazine -dextromethorphan 6.25-15 MG/5ML syrup Commonly known as: PROMETHAZINE -DM       TAKE these medications    aspirin  EC 81 MG tablet Take 1 tablet (81 mg total) by mouth daily.   clopidogrel  75 MG tablet Commonly known as: Plavix  Take 1 tablet (75 mg total) by mouth daily.   dapagliflozin  propanediol 5 MG Tabs tablet Commonly known as: Farxiga  Take 1 tablet (5 mg total) by mouth daily.   dicyclomine  10 MG capsule Commonly known as: BENTYL  Take 1 capsule (10 mg total) by mouth 3 (three) times daily before meals.   DULoxetine  30 MG capsule Commonly known as: CYMBALTA  Take 1 capsule (30 mg total) by mouth daily.   losartan -hydrochlorothiazide  100-25 MG tablet Commonly known as: HYZAAR  Take 1 tablet by mouth daily.   methocarbamol  500 MG tablet Commonly known as: ROBAXIN  Take 1 tablet (500 mg total) by mouth every 8 (eight)  hours as needed for muscle spasms.   methylPREDNISolone  4 MG Tbpk tablet Commonly known as: MEDROL  DOSEPAK As directed   metoprolol  tartrate 50 MG tablet Commonly known as: LOPRESSOR  Take 1 tablet (50 mg total) by mouth 2 (two) times daily.   ondansetron  8 MG tablet Commonly known as: Zofran  Take one prior to each prep dose.   ondansetron  4 MG tablet Commonly known as: ZOFRAN  Take 1 tablet (4 mg total) by mouth every 8 (eight) hours as needed for nausea or vomiting.   oxyCODONE  5 MG immediate release tablet Commonly known as: Oxy IR/ROXICODONE  Take 1 tablet (5 mg total) by mouth every 6 (six) hours as needed for up to 7 days for severe pain (pain score 7-10).   pantoprazole  40 MG tablet Commonly known as: PROTONIX  TAKE ONE TABLET (40mg  total) BY MOUTH DAILY AT 9AM What changed: See the new instructions.   pregabalin 50 MG capsule Commonly known as: LYRICA Take 50 mg by mouth 3 (three) times daily.   rosuvastatin  40 MG tablet Commonly known as: CRESTOR  Take 1 tablet (40 mg total) by mouth daily.               Durable Medical Equipment  (From admission,  onward)           Start     Ordered   07/09/23 9047  For home use only DME Walker rolling  Once       Question Answer Comment  Walker: With 5 Inch Wheels   Patient needs a walker to treat with the following condition Physical deconditioning      07/09/23 0952   07/09/23 0952  For home use only DME Bedside commode  Once       Question:  Patient needs a bedside commode to treat with the following condition  Answer:  Physical deconditioning   07/09/23 0952   07/08/23 0746  For home use only DME Walker rolling  Once       Question Answer Comment  Walker: With 5 Inch Wheels   Patient needs a walker to treat with the following condition S/P CABG x 4      07/08/23 0745            Follow-up Information     Dunn, Dayna N, PA-C Follow up.   Specialties: Cardiology, Radiology Why: Hospital follow-up with  Cardiology scheduled for 07/24/2023 at 8:25am. Please arrive 15 minutes early for check-in. If this date/ time does not work for you, please call our office to reschedule. Contact information: [address] [place] [address] [phone]         Kerrin Elspeth BROCKS, MD Follow up on 07/30/2023.   Specialty: Cardiothoracic Surgery Why: Appointment is at 4:30 Contact information: [address] [place] [address] [phone]         [facility] Follow up on 07/30/2023.   Why: Please get CXR at 3:30 prior to your appointment at Dr. Chrystal office Contact information: [address] [place]  [address]        [place] [place] [facility] Follow up.   Why: (Adapt)- rolling walker arranged- to be delivered to room prior to discharge Contact information: [address] [facility] [place] [address] [phone]                 Signed:  Rocky Shad, PA-C  07/10/2023, 7:27 AM

## 2023-07-04 NOTE — Progress Notes (Addendum)
 [address]       [place] [address]             [phone]      1 Day Post-Op Procedure(s) (LRB): CORONARY ARTERY BYPASS GRAFTING X 4, USING LEFT INTERNAL MAMMARY ARTERY AND ENDOSCOPICALLY HARVESTED RIGHT SAPHENOUS VEIN GRAFT (N/A) TRANSESOPHAGEAL ECHOCARDIOGRAM (TEE) (N/A)  Subjective:  Patient extubated last evening.  She is sleepy and sore this morning.  Denies N/V.  Provided patient education on importance of taking deep breaths, showed how use pillow to help with chest stabilization discomfort.  Objective: Vital signs in last 24 hours: Temp:  [95.5 F (35.3 C)-99.9 F (37.7 C)] 99.7 F (37.6 C) (01/09 0700) Pulse Rate:  [67-154] 80 (01/09 0700) Cardiac Rhythm: Normal sinus rhythm (01/08 2100) Resp:  [11-35] 19 (01/09 0700) BP: (106-132)/(71-88) 119/88 (01/08 1345) SpO2:  [76 %-100 %] 94 % (01/09 0700) Arterial Line BP: (95-199)/(49-85) 163/73 (01/09 0700) FiO2 (%):  [36 %-50 %] 36 % (01/08 1833) Weight:  [50.6 kg] 50.6 kg (01/09 0600)  Hemodynamic parameters for last 24 hours: PAP: (6-40)/(1-19) 36/17 CO:  [2 L/min-3.5 L/min] 2.4 L/min CI:  [1.4 L/min/m2-2.38 L/min/m2] 1.67 L/min/m2  Intake/Output from previous day: 01/08 0701 - 01/09 0700 In: 4152 [P.O.:50; I.V.:2007.5; Blood:235; IV Piggyback:1859.4] Out: 3285 [Urine:2745; Chest Tube:540]  General appearance: alert, cooperative, and no distress Heart: regular rate and rhythm Lungs: diminished breath sounds left base Abdomen: soft, non-tender; bowel sounds normal; no masses,  no organomegaly Extremities: edema minimal Wound: sternotomy dressing in place.SABRA RLE EVH site clean.. lower leg stab incision is bruised with minor drainage  Lab Results: Recent Labs    07/03/23 1932 07/03/23 1943 07/04/23 0358  WBC 10.4  --  9.0  HGB 9.3* 9.5* 8.7*  HCT 28.2* 28.0* 26.0*  PLT 208  --  155   BMET:  Recent Labs    07/03/23 1932 07/03/23 1943 07/04/23 0358  NA 137 138 137  K 4.4 4.5  3.9  CL 105  --  106  CO2 22  --  20*  GLUCOSE 93  --  97  BUN 13  --  11  CREATININE 0.54  --  0.65  CALCIUM  8.6*  --  8.4*    PT/INR:  Recent Labs    07/03/23 1330  LABPROT 16.9*  INR 1.4*   ABG    Component Value Date/Time   PHART 7.321 (L) 07/03/2023 1943   HCO3 24.1 07/03/2023 1943   TCO2 26 07/03/2023 1943   ACIDBASEDEF 2.0 07/03/2023 1943   O2SAT 99 07/03/2023 1943   CBG (last 3)  Recent Labs    07/04/23 0256 07/04/23 0401 07/04/23 0509  GLUCAP 87 97 107*    Assessment/Plan: S/P Procedure(s) (LRB): CORONARY ARTERY BYPASS GRAFTING X 4, USING LEFT INTERNAL MAMMARY ARTERY AND ENDOSCOPICALLY HARVESTED RIGHT SAPHENOUS VEIN GRAFT (N/A) TRANSESOPHAGEAL ECHOCARDIOGRAM (TEE) (N/A)  CV- NSR, currently off drips.SABRA long standing history of HTN and she has required several pushes of Lopressor .. will start oral Lopressor  today and restart oral home antihypertensives at indicated Pulm- currently on room air, CT output 520 cc since surgery.. CXR with left pleural effusion/atelectasis.SABRA likely leave chest tube in place today, will discuss with Dr. Kerrin Renal- creatinine is stable, weight is currently below admission weight, however patient was in heart failure on admission so I suspect that was not her baseline weight.. would benefit from Lasix  with pleural effusion will give IV 20 mg today Expected post operative blood loss  anemia, received blood in operating room, improved at 8.7 this morning, monitor... not clinically significant at this time GU- foley catheter in place, U/O has been good since surgery.SABRA leave in place today, can remove tomorrow if remains stable DM- remains on insulin  drip, will transition off today and start  basal, SSIP Dispo- overall patient doing well, will start Lopressor  today, low dose Lasix  to help with pleural effusions, no signs of acute bleeding, POD #1 progression orders   LOS: 6 days    Rocky Shad, PA-C 07/04/2023 Patient seen and  examined, agree with above Looks good CXR shows LLL atelectasis and effusion Plan as outlined above  Elspeth C. Kerrin, MD [facility] [phone]

## 2023-07-04 NOTE — Progress Notes (Signed)
 Heart Failure Navigator Progress Note  Assessed for Heart & Vascular TOC clinic readiness.  Patient does not meet criteria due to Advanced Heart Failure Team consulted..   Navigator will sign off at this time.    Stephane Haddock, BSN, Scientist, clinical (histocompatibility and immunogenetics) Only

## 2023-07-05 ENCOUNTER — Inpatient Hospital Stay (HOSPITAL_COMMUNITY): Payer: Medicare Other

## 2023-07-05 LAB — GLUCOSE, CAPILLARY
Glucose-Capillary: 101 mg/dL — ABNORMAL HIGH (ref 70–99)
Glucose-Capillary: 104 mg/dL — ABNORMAL HIGH (ref 70–99)
Glucose-Capillary: 107 mg/dL — ABNORMAL HIGH (ref 70–99)
Glucose-Capillary: 114 mg/dL — ABNORMAL HIGH (ref 70–99)
Glucose-Capillary: 115 mg/dL — ABNORMAL HIGH (ref 70–99)
Glucose-Capillary: 117 mg/dL — ABNORMAL HIGH (ref 70–99)
Glucose-Capillary: 119 mg/dL — ABNORMAL HIGH (ref 70–99)
Glucose-Capillary: 119 mg/dL — ABNORMAL HIGH (ref 70–99)
Glucose-Capillary: 126 mg/dL — ABNORMAL HIGH (ref 70–99)
Glucose-Capillary: 127 mg/dL — ABNORMAL HIGH (ref 70–99)
Glucose-Capillary: 158 mg/dL — ABNORMAL HIGH (ref 70–99)
Glucose-Capillary: 205 mg/dL — ABNORMAL HIGH (ref 70–99)
Glucose-Capillary: 215 mg/dL — ABNORMAL HIGH (ref 70–99)

## 2023-07-05 LAB — MAGNESIUM: Magnesium: 2.1 mg/dL (ref 1.7–2.4)

## 2023-07-05 LAB — BASIC METABOLIC PANEL
Anion gap: 11 (ref 5–15)
BUN: 9 mg/dL (ref 8–23)
CO2: 24 mmol/L (ref 22–32)
Calcium: 9.1 mg/dL (ref 8.9–10.3)
Chloride: 104 mmol/L (ref 98–111)
Creatinine, Ser: 0.67 mg/dL (ref 0.44–1.00)
GFR, Estimated: >60 mL/min (ref >60)
Glucose, Bld: 117 mg/dL — ABNORMAL HIGH (ref 70–99)
Potassium: 3.9 mmol/L (ref 3.5–5.1)
Sodium: 139 mmol/L (ref 135–145)

## 2023-07-05 LAB — CBC
HCT: 27.4 % — ABNORMAL LOW (ref 36.0–46.0)
Hemoglobin: 8.9 g/dL — ABNORMAL LOW (ref 12.0–15.0)
MCH: 27.9 pg (ref 26.0–34.0)
MCHC: 32.5 g/dL (ref 30.0–36.0)
MCV: 85.9 fL (ref 80.0–100.0)
Platelets: 185 10*3/uL (ref 150–400)
RBC: 3.19 MIL/uL — ABNORMAL LOW (ref 3.87–5.11)
RDW: 14.5 % (ref 11.5–15.5)
WBC: 12.5 10*3/uL — ABNORMAL HIGH (ref 4.0–10.5)
nRBC: 0 % (ref 0.0–0.2)

## 2023-07-05 MED ORDER — SODIUM CHLORIDE 0.9% FLUSH
3.0000 mL | Freq: Two times a day (BID) | INTRAVENOUS | Status: DC
  Administered 2023-07-05 – 2023-07-09 (×10): 3 mL via INTRAVENOUS

## 2023-07-05 MED ORDER — ALUM & MAG HYDROXIDE-SIMETH 200-200-20 MG/5ML PO SUSP: 15.0000 mL | Freq: Four times a day (QID) | ORAL | Status: DC | PRN

## 2023-07-05 MED ORDER — MAGNESIUM HYDROXIDE 400 MG/5ML PO SUSP
30.0000 mL | Freq: Every day | ORAL | Status: DC | PRN
  Filled 2023-07-05: qty 30

## 2023-07-05 MED ORDER — ENSURE ENLIVE PO LIQD
237.0000 mL | Freq: Two times a day (BID) | ORAL | Status: DC
  Administered 2023-07-05 – 2023-07-10 (×10): 237 mL via ORAL

## 2023-07-05 MED ORDER — LOSARTAN POTASSIUM 50 MG PO TABS
100.0000 mg | ORAL_TABLET | Freq: Every day | ORAL | Status: DC
  Administered 2023-07-05 – 2023-07-10 (×6): 100 mg via ORAL
  Filled 2023-07-05 (×6): qty 2

## 2023-07-05 MED ORDER — DICYCLOMINE HCL 10 MG PO CAPS
10.0000 mg | ORAL_CAPSULE | Freq: Three times a day (TID) | ORAL | Status: DC
  Administered 2023-07-06 – 2023-07-10 (×12): 10 mg via ORAL
  Filled 2023-07-05 (×17): qty 1

## 2023-07-05 MED ORDER — HYDROCHLOROTHIAZIDE 25 MG PO TABS
25.0000 mg | ORAL_TABLET | Freq: Every day | ORAL | Status: DC
  Administered 2023-07-05 – 2023-07-10 (×6): 25 mg via ORAL
  Filled 2023-07-05 (×6): qty 1

## 2023-07-05 MED ORDER — ~~LOC~~ CARDIAC SURGERY, PATIENT & FAMILY EDUCATION: Freq: Once | Status: AC

## 2023-07-05 MED ORDER — SODIUM CHLORIDE 0.9 % IV SOLN: 250.0000 mL | INTRAVENOUS | Status: AC | PRN

## 2023-07-05 MED ORDER — METHOCARBAMOL 500 MG PO TABS
500.0000 mg | ORAL_TABLET | Freq: Three times a day (TID) | ORAL | Status: DC | PRN
  Administered 2023-07-05: 500 mg via ORAL
  Filled 2023-07-05: qty 1

## 2023-07-05 MED ORDER — AMLODIPINE BESYLATE 10 MG PO TABS
10.0000 mg | ORAL_TABLET | Freq: Every day | ORAL | Status: DC
Start: 2023-07-05 — End: 2023-07-08
  Administered 2023-07-05 – 2023-07-07 (×3): 10 mg via ORAL
  Filled 2023-07-05 (×3): qty 1

## 2023-07-05 MED ORDER — LOSARTAN POTASSIUM-HCTZ 100-25 MG PO TABS: 1.0000 | ORAL_TABLET | Freq: Every day | ORAL | Status: DC

## 2023-07-05 MED ORDER — SODIUM CHLORIDE 0.9% FLUSH: 3.0000 mL | INTRAVENOUS | Status: DC | PRN

## 2023-07-05 MED ORDER — METOPROLOL TARTRATE 25 MG PO TABS
25.0000 mg | ORAL_TABLET | Freq: Two times a day (BID) | ORAL | Status: DC
  Administered 2023-07-05 – 2023-07-07 (×5): 25 mg via ORAL
  Filled 2023-07-05 (×6): qty 1

## 2023-07-05 MED ORDER — GUAIFENESIN ER 600 MG PO TB12
600.0000 mg | ORAL_TABLET | Freq: Two times a day (BID) | ORAL | Status: AC
  Administered 2023-07-05 – 2023-07-09 (×10): 600 mg via ORAL
  Filled 2023-07-05 (×10): qty 1

## 2023-07-05 MED ORDER — INSULIN ASPART 100 UNIT/ML IJ SOLN: 0.0000 [IU] | Freq: Three times a day (TID) | INTRAMUSCULAR | Status: DC

## 2023-07-05 MED ORDER — DAPAGLIFLOZIN PROPANEDIOL 5 MG PO TABS
5.0000 mg | ORAL_TABLET | Freq: Every day | ORAL | Status: DC
  Administered 2023-07-05 – 2023-07-10 (×6): 5 mg via ORAL
  Filled 2023-07-05 (×7): qty 1

## 2023-07-05 MED ORDER — INSULIN ASPART 100 UNIT/ML IJ SOLN
0.0000 [IU] | Freq: Three times a day (TID) | INTRAMUSCULAR | Status: DC
  Administered 2023-07-05: 2 [IU] via SUBCUTANEOUS

## 2023-07-05 MED FILL — Potassium Chloride Inj 2 mEq/ML: INTRAVENOUS | Qty: 40 | Status: AC

## 2023-07-05 MED FILL — Heparin Sodium (Porcine) Inj 1000 Unit/ML: Qty: 1000 | Status: AC

## 2023-07-05 MED FILL — Magnesium Sulfate Inj 50%: INTRAMUSCULAR | Qty: 10 | Status: AC

## 2023-07-05 NOTE — Progress Notes (Signed)
 [address]       [place] [address]             [phone]       2 Days Post-Op Procedure(s) (LRB): CORONARY ARTERY BYPASS GRAFTING X 4, USING LEFT INTERNAL MAMMARY ARTERY AND ENDOSCOPICALLY HARVESTED RIGHT SAPHENOUS VEIN GRAFT (N/A) TRANSESOPHAGEAL ECHOCARDIOGRAM (TEE) (N/A)  Subjective:  Patient sitting up in bed.  States she had a rough night.  She was up and down going to the bathroom with loose stools.  Objective: Vital signs in last 24 hours: Temp:  [97.6 F (36.4 C)-99.3 F (37.4 C)] 97.6 F (36.4 C) (01/09 1600) Pulse Rate:  [75-88] 82 (01/10 0600) Cardiac Rhythm: Normal sinus rhythm (01/09 1936) Resp:  [11-32] 24 (01/10 0600) BP: (108-162)/(74-97) 152/93 (01/10 0600) SpO2:  [90 %-99 %] 95 % (01/10 0600) Arterial Line BP: (146-167)/(66-72) 150/67 (01/09 0900) Weight:  [52.6 kg] 52.6 kg (01/10 0500)  Hemodynamic parameters for last 24 hours: PAP: (34-44)/(16-20) 44/20 CO:  [2.6 L/min-2.8 L/min] 2.8 L/min CI:  [1.8 L/min/m2-1.9 L/min/m2] 1.9 L/min/m2  Intake/Output from previous day: 01/09 0701 - 01/10 0700 In: 310.9 [I.V.:211.1; IV Piggyback:99.9] Out: 2790 [Urine:2360; Chest Tube:430]  General appearance: alert, cooperative, and no distress Heart: regular rate and rhythm Lungs: diminished breath sounds on left Abdomen: soft, non-tender; bowel sounds normal; no masses,  no organomegaly Extremities: no edema present Wound: clean and dry, bruising along RLE  Lab Results: Recent Labs    07/04/23 1615 07/05/23 0514  WBC 12.8* 12.5*  HGB 9.7* 8.9*  HCT 29.6* 27.4*  PLT 203 185   BMET:  Recent Labs    07/04/23 1615 07/05/23 0514  NA 136 139  K 3.5 3.9  CL 102 104  CO2 23 24  GLUCOSE 157* 117*  BUN 8 9  CREATININE 0.80 0.67  CALCIUM  8.7* 9.1    PT/INR:  Recent Labs    07/03/23 1330  LABPROT 16.9*  INR 1.4*   ABG    Component Value Date/Time   PHART 7.321 (L) 07/03/2023 1943   HCO3 24.1 07/03/2023 1943   TCO2  26 07/03/2023 1943   ACIDBASEDEF 2.0 07/03/2023 1943   O2SAT 99 07/03/2023 1943   CBG (last 3)  Recent Labs    07/04/23 2020 07/05/23 0000 07/05/23 0419  GLUCAP 119* 127* 107*    Assessment/Plan: S/P Procedure(s) (LRB): CORONARY ARTERY BYPASS GRAFTING X 4, USING LEFT INTERNAL MAMMARY ARTERY AND ENDOSCOPICALLY HARVESTED RIGHT SAPHENOUS VEIN GRAFT (N/A) TRANSESOPHAGEAL ECHOCARDIOGRAM (TEE) (N/A)  CV- NSR, + HTN... chronic- on Lopressor  12.5 mg BID, despite PM dose of Cozaar  last night she is requiring prn lopressor  pushes.. will increase AM cozaar  dose to 50 mg daily Pulm- chest xray appears to be mostly atelectasis in left lower lobe.. she is using IS only getting to 500 cc, breathing effort improved this morning.. flutter valve was ordered however patient did not receive, discussed with nursing she will get one this morning.. CT output was around 400 cc yesterday, can likely d/c will defer to Dr. Kerrin Renal- creatinine remains stable, good U/O.SABRA AHF is following and can determine need for diuretics GI- diarrhea overnight, all stool softners stopped.. would like to avoid immodium if able Expected post operative blood loss anemia, not clinically significant, stable at 8.9 DM- sugars are stable, will stop long acting insulin  continue SSIP Dispo- patient doing well, Hypertensive adjust BP medications... d/c foley, diarrhea overnight stop softners, ? D/c pacing wires, chest tubes discuss with Dr.  Hendrickson.. could likely transfer to 4E   LOS: 7 days    Rocky Shad, PA-C 07/05/2023

## 2023-07-05 NOTE — Plan of Care (Signed)
  Problem: Skin Integrity: Goal: Risk for impaired skin integrity will decrease Outcome: Progressing   Problem: Tissue Perfusion: Goal: Adequacy of tissue perfusion will improve Outcome: Progressing   Problem: Activity: Goal: Risk for activity intolerance will decrease Outcome: Progressing

## 2023-07-05 NOTE — Evaluation (Signed)
 Physical Therapy Evaluation Patient Details Name: [name] MRN: [id number] DOB: Jul 10, 1955 Today's Date: 07/05/2023  History of Present Illness  68 yo female admitted 06/23/23 to [facility] with chest pain, HTN emergency and elevated troponin. 1/2 transfer to [facility] for left heart cath. 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 She lives with niece currently in extended stay hotel but looking for an apartment. Pt educated for all sternal precautions but had difficulty recalling throughout session with cardiac book provided and pt then able to state precautions. Pt moving well with limited assist for transfers and gait to maintain precautions. Pt will benefit from acute therapy to maximize independence, adherence to precautions and safety without anticipation of needs at D/C.   HR 83-86 SPO2 95% on RA 137/84 (99) before activity 131/81 (95) post gait        If plan is discharge home, recommend the following: A little help with walking and/or transfers;A little help with bathing/dressing/bathroom   Can travel by private vehicle        Equipment Recommendations Rolling walker (2 wheels);BSC/3in1  Recommendations for Other Services       Functional Status Assessment Patient has had a recent decline in their functional status and demonstrates the ability to make significant improvements in function in a reasonable and predictable amount of time.     Precautions / Restrictions Precautions Precautions: Sternal Precaution Booklet Issued: Yes (comment)      Mobility  Bed Mobility Overal bed mobility: Needs Assistance Bed Mobility: Rolling, Sidelying to Sit Rolling: Supervision Sidelying to sit: Supervision       General bed mobility comments: cues for sequence, precautions and safety    Transfers Overall transfer level: Needs assistance    Transfers: Sit to/from Stand Sit to Stand: Supervision           General transfer comment: cues for hand placement and safety to rise from bed and BSC    Ambulation/Gait Ambulation/Gait assistance: Supervision Gait Distance (Feet): 400 Feet Assistive device: Rolling walker (2 wheels) Gait Pattern/deviations: Step-through pattern, Decreased stride length, Trunk flexed   Gait velocity interpretation: <1.8 ft/sec, indicate of risk for recurrent falls   General Gait Details: cues for posture and proximity to [employer]            Wheelchair Mobility     Tilt Bed    Modified Rankin (Stroke Patients Only)       Balance Overall balance assessment: Mild deficits observed, not formally tested                                           Pertinent Vitals/Pain Pain Assessment Pain Assessment: No/denies pain    Home Living Family/patient expects to be discharged to:: Private residence Living Arrangements: Other relatives Available Help at Discharge: Family;Available 24 hours/day Type of Home: Other(Comment) (extended stay hotel, looking for apartment) Home Access: Level entry       Home Layout: One level Home Equipment: None      Prior Function Prior Level of Function : Driving;Independent/Modified Independent                     Extremity/Trunk Assessment   Upper Extremity Assessment Upper Extremity Assessment: Overall  WFL for tasks assessed    Lower Extremity Assessment Lower Extremity Assessment: Overall WFL for tasks assessed    Cervical / Trunk Assessment Cervical / Trunk Assessment: Normal  Communication   Communication Communication: No apparent difficulties  Cognition Arousal: Alert Behavior During Therapy: WFL for tasks assessed/performed Overall Cognitive Status: Within Functional Limits for tasks assessed                                          General Comments      Exercises      Assessment/Plan    PT Assessment Patient needs continued PT services  PT Problem List Decreased activity tolerance;Decreased balance;Decreased mobility;Decreased knowledge of precautions       PT Treatment Interventions DME instruction;Gait training;Functional mobility training;Therapeutic activities;Patient/family education    PT Goals (Current goals can be found in the Care Plan section)  Acute Rehab PT Goals Patient Stated Goal: return to work [facility] aide) PT Goal Formulation: With patient Time For Goal Achievement: 07/12/23 Potential to Achieve Goals: Good    Frequency Min 1X/week     Co-evaluation               AM-PAC PT 6 Clicks Mobility  Outcome Measure Help needed turning from your back to your side while in a flat bed without using bedrails?: A Little Help needed moving from lying on your back to sitting on the side of a flat bed without using bedrails?: A Little Help needed moving to and from a bed to a chair (including a wheelchair)?: A Little Help needed standing up from a chair using your arms (e.g., wheelchair or bedside chair)?: A Little Help needed to walk in hospital room?: A Little Help needed climbing 3-5 steps with a railing? : A Little 6 Click Score: 18    End of Session   Activity Tolerance: Patient tolerated treatment well Patient left: in chair;with call bell/phone within reach Nurse Communication: Mobility status PT Visit Diagnosis: Other abnormalities of gait and mobility (R26.89)    Time: 8844-8779 PT Time Calculation (min) (ACUTE ONLY): 25 min   Charges:   PT Evaluation $PT Eval Moderate Complexity: 1 Mod PT Treatments $Gait Training: 8-22 mins PT General Charges $$ ACUTE PT VISIT: 1 Visit         [name], PT Acute Rehabilitation Services Office: [phone]   [name] Kendal Ghazarian 07/05/2023, 12:43 PM

## 2023-07-05 NOTE — Progress Notes (Signed)
 2 Days Post-Op Procedure(s) (LRB): CORONARY ARTERY BYPASS GRAFTING X 4, USING LEFT INTERNAL MAMMARY ARTERY AND ENDOSCOPICALLY HARVESTED RIGHT SAPHENOUS VEIN GRAFT (N/A) TRANSESOPHAGEAL ECHOCARDIOGRAM (TEE) (N/A) Subjective: Didn't sleep well last night  Objective: Vital signs in last 24 hours: Temp:  [97.6 F (36.4 C)-99.3 F (37.4 C)] 97.6 F (36.4 C) (01/09 1600) Pulse Rate:  [75-88] 88 (01/10 0700) Cardiac Rhythm: Normal sinus rhythm (01/09 1936) Resp:  [11-32] 17 (01/10 0700) BP: (108-165)/(74-97) 165/96 (01/10 0700) SpO2:  [90 %-99 %] 95 % (01/10 0700) Arterial Line BP: (146-167)/(66-72) 150/67 (01/09 0900) Weight:  [52.6 kg] 52.6 kg (01/10 0500)  Hemodynamic parameters for last 24 hours: PAP: (34-44)/(16-20) 44/20 CO:  [2.6 L/min-2.8 L/min] 2.8 L/min CI:  [1.8 L/min/m2-1.9 L/min/m2] 1.9 L/min/m2  Intake/Output from previous day: 01/09 0701 - 01/10 0700 In: 461.6 [I.V.:211.1; IV Piggyback:250.5] Out: 2790 [Urine:2360; Chest Tube:430] Intake/Output this shift: No intake/output data recorded.  General appearance: alert, cooperative, and no distress Neurologic: intact Heart: regular rate and rhythm Lungs: diminished breath sounds left base Abdomen: normal findings: soft, non-tender  Lab Results: Recent Labs    07/04/23 1615 07/05/23 0514  WBC 12.8* 12.5*  HGB 9.7* 8.9*  HCT 29.6* 27.4*  PLT 203 185   BMET:  Recent Labs    07/04/23 1615 07/05/23 0514  NA 136 139  K 3.5 3.9  CL 102 104  CO2 23 24  GLUCOSE 157* 117*  BUN 8 9  CREATININE 0.80 0.67  CALCIUM  8.7* 9.1    PT/INR:  Recent Labs    07/03/23 1330  LABPROT 16.9*  INR 1.4*   ABG    Component Value Date/Time   PHART 7.321 (L) 07/03/2023 1943   HCO3 24.1 07/03/2023 1943   TCO2 26 07/03/2023 1943   ACIDBASEDEF 2.0 07/03/2023 1943   O2SAT 99 07/03/2023 1943   CBG (last 3)  Recent Labs    07/04/23 2020 07/05/23 0000 07/05/23 0419  GLUCAP 119* 127* 107*    Assessment/Plan: S/P  Procedure(s) (LRB): CORONARY ARTERY BYPASS GRAFTING X 4, USING LEFT INTERNAL MAMMARY ARTERY AND ENDOSCOPICALLY HARVESTED RIGHT SAPHENOUS VEIN GRAFT (N/A) TRANSESOPHAGEAL ECHOCARDIOGRAM (TEE) (N/A) POD # 2 NEURO- intact CV- in Sr  Hypertensive- resume home meds- Norvasc , Hyzaar   Increase Lopressor  to 25 BID  ASA, statin RESP- LLL atelectasis +/- effusion  IS, flutter, add ucinrx RENAL- creatinine and lytes OK ENDO- CBG well controlled  Resume Farxiga  GI- diet as tolerated Anemia- Hct 27, monitor SCD + enoxaparin  Dc chest tubes Transfer to 4E  LOS: 7 days    Elspeth JAYSON Millers 07/05/2023

## 2023-07-06 ENCOUNTER — Inpatient Hospital Stay (HOSPITAL_COMMUNITY): Payer: Medicare Other

## 2023-07-06 LAB — BASIC METABOLIC PANEL
Anion gap: 9 (ref 5–15)
BUN: 12 mg/dL (ref 8–23)
CO2: 25 mmol/L (ref 22–32)
Calcium: 9.3 mg/dL (ref 8.9–10.3)
Chloride: 103 mmol/L (ref 98–111)
Creatinine, Ser: 0.65 mg/dL (ref 0.44–1.00)
GFR, Estimated: >60 mL/min (ref >60)
Glucose, Bld: 148 mg/dL — ABNORMAL HIGH (ref 70–99)
Potassium: 3.9 mmol/L (ref 3.5–5.1)
Sodium: 137 mmol/L (ref 135–145)

## 2023-07-06 LAB — URINALYSIS, ROUTINE W REFLEX MICROSCOPIC
Bacteria, UA: NONE SEEN
Bilirubin Urine: NEGATIVE
Glucose, UA: >=500 mg/dL — AB
Hgb urine dipstick: NEGATIVE
Ketones, ur: 5 mg/dL — AB
Leukocytes,Ua: NEGATIVE
Nitrite: NEGATIVE
Protein, ur: NEGATIVE mg/dL
Specific Gravity, Urine: 1.016 (ref 1.005–1.030)
pH: 6 (ref 5.0–8.0)

## 2023-07-06 LAB — CBC
HCT: 28.4 % — ABNORMAL LOW (ref 36.0–46.0)
Hemoglobin: 9.4 g/dL — ABNORMAL LOW (ref 12.0–15.0)
MCH: 28.1 pg (ref 26.0–34.0)
MCHC: 33.1 g/dL (ref 30.0–36.0)
MCV: 84.8 fL (ref 80.0–100.0)
Platelets: 197 10*3/uL (ref 150–400)
RBC: 3.35 MIL/uL — ABNORMAL LOW (ref 3.87–5.11)
RDW: 14.5 % (ref 11.5–15.5)
WBC: 14.9 10*3/uL — ABNORMAL HIGH (ref 4.0–10.5)
nRBC: 0 % (ref 0.0–0.2)

## 2023-07-06 NOTE — Progress Notes (Signed)
 Patient ambulated with front wheeled walker approcimately 480 feet with minimal assistance.  Patient tolerated ambulation well.

## 2023-07-06 NOTE — Progress Notes (Addendum)
 3 Days Post-Op Procedure(s) (LRB): CORONARY ARTERY BYPASS GRAFTING X 4, USING LEFT INTERNAL MAMMARY ARTERY AND ENDOSCOPICALLY HARVESTED RIGHT SAPHENOUS VEIN GRAFT (N/A) TRANSESOPHAGEAL ECHOCARDIOGRAM (TEE) (N/A) Subjective: Some nausea yesterday- resolve, o/w feels pretty well  Objective: Vital signs in last 24 hours: Temp:  [97.4 F (36.3 C)-98.2 F (36.8 C)] 97.9 F (36.6 C) (01/11 0346) Pulse Rate:  [85-112] 95 (01/11 0346) Cardiac Rhythm: Normal sinus rhythm (01/10 2000) Resp:  [17-30] 20 (01/11 0346) BP: (120-165)/(79-100) 144/88 (01/11 0346) SpO2:  [89 %-98 %] 95 % (01/11 0346) Weight:  [49.9 kg] 49.9 kg (01/11 0346)  Hemodynamic parameters for last 24 hours:    Intake/Output from previous day: 01/10 0701 - 01/11 0700 In: 840 [P.O.:840] Out: 30 [Chest Tube:30] Intake/Output this shift: No intake/output data recorded.  General appearance: alert, cooperative, and no distress Heart: regular rate and rhythm Lungs: dim in basesL>R Abdomen: benign Extremities: no edema Wound: dressings CDI  Lab Results: Recent Labs    07/05/23 0514 07/06/23 0258  WBC 12.5* 14.9*  HGB 8.9* 9.4*  HCT 27.4* 28.4*  PLT 185 197   BMET:  Recent Labs    07/05/23 0514 07/06/23 0258  NA 139 137  K 3.9 3.9  CL 104 103  CO2 24 25  GLUCOSE 117* 148*  BUN 9 12  CREATININE 0.67 0.65  CALCIUM  9.1 9.3    PT/INR:  Recent Labs    07/03/23 1330  LABPROT 16.9*  INR 1.4*   ABG    Component Value Date/Time   PHART 7.321 (L) 07/03/2023 1943   HCO3 24.1 07/03/2023 1943   TCO2 26 07/03/2023 1943   ACIDBASEDEF 2.0 07/03/2023 1943   O2SAT 99 07/03/2023 1943   CBG (last 3)  Recent Labs    07/05/23 0803 07/05/23 1139 07/05/23 1639  GLUCAP 158* 205* 215*    Meds Scheduled Meds:  acetaminophen   1,000 mg Oral Q6H   Or   acetaminophen  (TYLENOL ) oral liquid 160 mg/5 mL  1,000 mg Per Tube Q6H   amLODipine   10 mg Oral Daily   aspirin  EC  325 mg Oral Daily   Or   aspirin   324  mg Per Tube Daily   Chlorhexidine  Gluconate Cloth  6 each Topical Daily   dapagliflozin  propanediol  5 mg Oral Daily   dicyclomine   10 mg Oral TID AC   DULoxetine   30 mg Oral Daily   enoxaparin  (LOVENOX ) injection  40 mg Subcutaneous QHS   feeding supplement  237 mL Oral BID BM   guaiFENesin   600 mg Oral BID   losartan   100 mg Oral Daily   And   hydrochlorothiazide   25 mg Oral Daily   metoprolol  tartrate  25 mg Oral BID   pantoprazole   40 mg Oral Daily   rosuvastatin   40 mg Oral Daily   sodium chloride  flush  3 mL Intravenous Q12H   Continuous Infusions:  sodium chloride      PRN Meds:.sodium chloride , alum & mag hydroxide-simeth, magnesium  hydroxide, melatonin, methocarbamol , metoprolol  tartrate, ondansetron  (ZOFRAN ) IV, oxyCODONE , sodium chloride  flush, traMADol   Xrays DG Chest Port 1 View Result Date: 07/05/2023 CLINICAL DATA:  [id number] S/P CABG x 4 [id number] EXAM: PORTABLE CHEST 1 VIEW COMPARISON:  07/04/2023. FINDINGS: Redemonstration of left retrocardiac airspace opacity obscuring the left hemidiaphragm, descending thoracic aorta and blunting the left lateral costophrenic angle, suggesting combination of left lung atelectasis and/or consolidation with pleural effusion. No significant interval change. Bilateral lung fields are otherwise clear. There is probable trace right pleural effusion,  unchanged. No pneumothorax on either side. Stable cardio-mediastinal silhouette. No acute osseous abnormalities. Lower cervical spinal fixation hardware noted. The soft tissues are within normal limits. Tube/lines: Mediastinal/pericardial drain and left pleural drainage catheter are unchanged. Right IJ Swan-Ganz sheath noted. However, there is interval removal of right IJ Swan-Ganz catheter. IMPRESSION: *Persistent left retrocardiac opacity, as described above. *Probable trace right pleural effusion, unchanged. No pneumothorax on either side. Electronically Signed   By: Ree Molt M.D.   On: 07/05/2023  08:04    Assessment/Plan: S/P Procedure(s) (LRB): CORONARY ARTERY BYPASS GRAFTING X 4, USING LEFT INTERNAL MAMMARY ARTERY AND ENDOSCOPICALLY HARVESTED RIGHT SAPHENOUS VEIN GRAFT (N/A) TRANSESOPHAGEAL ECHOCARDIOGRAM (TEE) (N/A) POD#3  1 afeb, s BP 120's-160's, mostly elevated- meds increased yesterday, will monitor, sinus rhythm 2 O2 sats good on RA 3 weight about 1 kg> preop, voiding- not measured 4 BS fair control, farxiga  re- ordered yesterday, A1C 6.2 preop 5 normal renal fxn 6 leukocytosis trending higher- cont to monitor 7 anemia trend slightly improved w/equilibration 8 CXR, bibasilar atx/ASD, L>R effusions- monitor clinically, may need thoracentesis if conts to enlarge 9 push rehab and pulm hygiene as able 10 will check UA   LOS: 8 days    Lemond FORBES Cera PA-C Pager [phone] 07/06/2023   Chart reviewed, patient examined, agree with above.  CXR looks about the same as it did when she had a tube in the left pleural space so I suspect the density at the left base is atelectasis. Encouraged to continue ambulating and using IS.

## 2023-07-07 LAB — CBC
HCT: 28.9 % — ABNORMAL LOW (ref 36.0–46.0)
Hemoglobin: 9.5 g/dL — ABNORMAL LOW (ref 12.0–15.0)
MCH: 28 pg (ref 26.0–34.0)
MCHC: 32.9 g/dL (ref 30.0–36.0)
MCV: 85.3 fL (ref 80.0–100.0)
Platelets: 244 10*3/uL (ref 150–400)
RBC: 3.39 MIL/uL — ABNORMAL LOW (ref 3.87–5.11)
RDW: 14.4 % (ref 11.5–15.5)
WBC: 12.4 10*3/uL — ABNORMAL HIGH (ref 4.0–10.5)
nRBC: 0 % (ref 0.0–0.2)

## 2023-07-07 LAB — TYPE AND SCREEN
ABO/RH(D): O NEG
Antibody Screen: NEGATIVE
Unit division: 0
Unit division: 0
Unit division: 0
Unit division: 0

## 2023-07-07 LAB — BPAM RBC
Blood Product Expiration Date: 202502012359
Blood Product Expiration Date: 202502012359
Blood Product Expiration Date: 202502032359
Blood Product Expiration Date: 202502032359
ISSUE DATE / TIME: 202501081051
ISSUE DATE / TIME: 202501111724
Unit Type and Rh: 5100
Unit Type and Rh: 5100
Unit Type and Rh: 9500
Unit Type and Rh: 9500

## 2023-07-07 MED ORDER — METOPROLOL TARTRATE 25 MG PO TABS
37.5000 mg | ORAL_TABLET | Freq: Two times a day (BID) | ORAL | Status: DC
  Administered 2023-07-07: 37.5 mg via ORAL
  Filled 2023-07-07: qty 1

## 2023-07-07 NOTE — Progress Notes (Signed)
 Mobility Specialist Progress Note:   07/07/23 1218  Mobility  Activity Ambulated with assistance in hallway  Level of Assistance Contact guard assist, steadying assist  Assistive Device Front wheel walker  Distance Ambulated (ft) 150 ft  RUE Weight Bearing Per Provider Order NWB  LUE Weight Bearing Per Provider Order NWB  Activity Response Tolerated well  Mobility Referral Yes  Mobility visit 1 Mobility  Mobility Specialist Start Time (ACUTE ONLY) 1055  Mobility Specialist Stop Time (ACUTE ONLY) 1110  Mobility Specialist Time Calculation (min) (ACUTE ONLY) 15 min   Pre Mobility: 113 HR  During Mobility: 123-138 HR  Post Mobility: 118 HR   Pt received in bed, agreeable to mobility. Pt was able to keep sternal precautions throughout session. Requesting to use BR before ambulation. Void successful. Pt was able to walk short distance in hallway with MinG assist. C/o fatigue and chest pain, otherwise asx throughout. HR peaked at 138 bpm during session. Pt left in chair with call bell in reach and all needs met. RN notified.   Brown Husband  Mobility Specialist Please contact via [employer] office at [phone]

## 2023-07-07 NOTE — Progress Notes (Addendum)
 4 Days Post-Op Procedure(s) (LRB): CORONARY ARTERY BYPASS GRAFTING X 4, USING LEFT INTERNAL MAMMARY ARTERY AND ENDOSCOPICALLY HARVESTED RIGHT SAPHENOUS VEIN GRAFT (N/A) TRANSESOPHAGEAL ECHOCARDIOGRAM (TEE) (N/A) Subjective: Feeling a bit stronger, but somewhat weak  Objective: Vital signs in last 24 hours: Temp:  [97.6 F (36.4 C)-98.4 F (36.9 C)] 97.7 F (36.5 C) (01/12 1119) Pulse Rate:  [93-109] 109 (01/12 0824) Cardiac Rhythm: Normal sinus rhythm (01/12 0700) Resp:  [15-24] 15 (01/12 1119) BP: (120-138)/(78-89) 124/85 (01/12 1119) SpO2:  [95 %-99 %] 95 % (01/12 0315) Weight:  [45.4 kg] 45.4 kg (01/12 0624)  Hemodynamic parameters for last 24 hours:    Intake/Output from previous day: 01/11 0701 - 01/12 0700 In: -  Out: 1300 [Urine:1300] Intake/Output this shift: Total I/O In: 240 [P.O.:240] Out: -   General appearance: alert, cooperative, and no distress Heart: regular rate and rhythm and tachy Lungs: improved but still somewhat diminished in bases Abdomen: benign Extremities: no edema Wound: incis healing well  Lab Results: Recent Labs    07/06/23 0258 07/07/23 0312  WBC 14.9* 12.4*  HGB 9.4* 9.5*  HCT 28.4* 28.9*  PLT 197 244   BMET:  Recent Labs    07/05/23 0514 07/06/23 0258  NA 139 137  K 3.9 3.9  CL 104 103  CO2 24 25  GLUCOSE 117* 148*  BUN 9 12  CREATININE 0.67 0.65  CALCIUM  9.1 9.3    PT/INR: No results for input(s): LABPROT, INR in the last 72 hours. ABG    Component Value Date/Time   PHART 7.321 (L) 07/03/2023 1943   HCO3 24.1 07/03/2023 1943   TCO2 26 07/03/2023 1943   ACIDBASEDEF 2.0 07/03/2023 1943   O2SAT 99 07/03/2023 1943   CBG (last 3)  Recent Labs    07/05/23 0803 07/05/23 1139 07/05/23 1639  GLUCAP 158* 205* 215*    Meds Scheduled Meds:  acetaminophen   1,000 mg Oral Q6H   Or   acetaminophen  (TYLENOL ) oral liquid 160 mg/5 mL  1,000 mg Per Tube Q6H   amLODipine   10 mg Oral Daily   aspirin  EC  325 mg Oral  Daily   Or   aspirin   324 mg Per Tube Daily   Chlorhexidine  Gluconate Cloth  6 each Topical Daily   dapagliflozin  propanediol  5 mg Oral Daily   dicyclomine   10 mg Oral TID AC   DULoxetine   30 mg Oral Daily   enoxaparin  (LOVENOX ) injection  40 mg Subcutaneous QHS   feeding supplement  237 mL Oral BID BM   guaiFENesin   600 mg Oral BID   losartan   100 mg Oral Daily   And   hydrochlorothiazide   25 mg Oral Daily   metoprolol  tartrate  25 mg Oral BID   pantoprazole   40 mg Oral Daily   rosuvastatin   40 mg Oral Daily   sodium chloride  flush  3 mL Intravenous Q12H   Continuous Infusions: PRN Meds:.alum & mag hydroxide-simeth, magnesium  hydroxide, melatonin, methocarbamol , metoprolol  tartrate, ondansetron  (ZOFRAN ) IV, oxyCODONE , sodium chloride  flush, traMADol   Xrays DG Chest 2 View Result Date: 07/06/2023 CLINICAL DATA:  Atelectasis. EXAM: CHEST - 2 VIEW COMPARISON:  07/05/2023 FINDINGS: Interval removal of thoracic drains and right IJ sheath. The cardio pericardial silhouette is enlarged. Bibasilar collapse/consolidation, left greater than right with small bilateral pleural effusions, similar to prior. Bones are diffusely demineralized. Telemetry leads overlie the chest. IMPRESSION: 1. Interval removal of thoracic drains and right IJ sheath. 2. Bibasilar collapse/consolidation, left greater than right with small bilateral  pleural effusions, similar to prior. Electronically Signed   By: Camellia Candle M.D.   On: 07/06/2023 08:17    Assessment/Plan: S/P Procedure(s) (LRB): CORONARY ARTERY BYPASS GRAFTING X 4, USING LEFT INTERNAL MAMMARY ARTERY AND ENDOSCOPICALLY HARVESTED RIGHT SAPHENOUS VEIN GRAFT (N/A) TRANSESOPHAGEAL ECHOCARDIOGRAM (TEE) (N/A) POD#4  1 afeb, VSS, sinus tachy in 110's-120's, BP control improved- will increase beta blocker 2 O2 sats good on RA 3 weight below preop 4 leukocytosis trending lower, UA- no signs of infection 5 push rehab and pulm hygiene as able 6 likely home  1-2 days, stays with niece, PT has seen    LOS: 9 days    Lemond FORBES Cera PA-C Pager [phone] 07/07/2023   Chart reviewed, patient examined, agree with above.  May need to increase Lopressor  further and decrease Norvasc  if needed to control sinus tachy.

## 2023-07-08 MED ORDER — METOPROLOL TARTRATE 50 MG PO TABS
50.0000 mg | ORAL_TABLET | Freq: Two times a day (BID) | ORAL | Status: DC
  Administered 2023-07-08 – 2023-07-10 (×5): 50 mg via ORAL
  Filled 2023-07-08 (×5): qty 1

## 2023-07-08 NOTE — Progress Notes (Signed)
 Patient with 7 of 10 pain is requesting 2 tramadol moderate pain med instead of severe pain med oxycodone.

## 2023-07-08 NOTE — Progress Notes (Addendum)
      [address]       [place] [address]             [phone]      5 Days Post-Op Procedure(s) (LRB): CORONARY ARTERY BYPASS GRAFTING X 4, USING LEFT INTERNAL MAMMARY ARTERY AND ENDOSCOPICALLY HARVESTED RIGHT SAPHENOUS VEIN GRAFT (N/A) TRANSESOPHAGEAL ECHOCARDIOGRAM (TEE) (N/A)  Subjective:  Patient is doing well.  She is discouraged about her elevated HR.  + ambulation limited by HR.  Objective: Vital signs in last 24 hours: Temp:  [97.7 F (36.5 C)-98.5 F (36.9 C)] 98.5 F (36.9 C) (01/13 0339) Pulse Rate:  [109-114] 114 (01/12 2034) Cardiac Rhythm: Sinus tachycardia (01/12 2150) Resp:  [15-25] 19 (01/13 0346) BP: (115-138)/(78-90) 120/78 (01/13 0346) SpO2:  [98 %] 98 % (01/12 2034) Weight:  [44 kg] 44 kg (01/13 0339)  Intake/Output from previous day: 01/12 0701 - 01/13 0700 In: 480 [P.O.:480] Out: -   General appearance: alert, cooperative, and no distress Heart: regular rate and rhythm and tachy Lungs: diminished breath sounds bibasilar Abdomen: soft, non-tender; bowel sounds normal; no masses,  no organomegaly Extremities: ecchymosis RLE Wound: clean and dry  Lab Results: Recent Labs    07/06/23 0258 07/07/23 0312  WBC 14.9* 12.4*  HGB 9.4* 9.5*  HCT 28.4* 28.9*  PLT 197 244   BMET:  Recent Labs    07/06/23 0258  NA 137  K 3.9  CL 103  CO2 25  GLUCOSE 148*  BUN 12  CREATININE 0.65  CALCIUM  9.3    PT/INR: No results for input(s): LABPROT, INR in the last 72 hours. ABG    Component Value Date/Time   PHART 7.321 (L) 07/03/2023 1943   HCO3 24.1 07/03/2023 1943   TCO2 26 07/03/2023 1943   ACIDBASEDEF 2.0 07/03/2023 1943   O2SAT 99 07/03/2023 1943   CBG (last 3)  Recent Labs    07/05/23 0803 07/05/23 1139 07/05/23 1639  GLUCAP 158* 205* 215*    Assessment/Plan: S/P Procedure(s) (LRB): CORONARY ARTERY BYPASS GRAFTING X 4, USING LEFT INTERNAL MAMMARY ARTERY AND ENDOSCOPICALLY HARVESTED RIGHT SAPHENOUS VEIN  GRAFT (N/A) TRANSESOPHAGEAL ECHOCARDIOGRAM (TEE) (N/A)  CV- Sinus Tachycardia, H/O HTN- will increase Lopressor  to 50 mg TID, continue Cozaar /hydrochlorothiazide .. will stop Norvasc  Pulm- not requiring oxygen.SABRA CXR with LLL consolidation/atelectasis.. however there is some fluid component present.. she is not requiring oxygen ID- afebrile, leukocytosis resolving, no acute signs/symptoms of infection Deconditioning- mild, will order home DME Dispo- patient stable, will increase Lopressor  and stop Norvasc .. need to get HR under better control, continue aggressive IS for pulmonary toilet   LOS: 10 days    Rocky Shad, PA-C 07/08/2023  Patient seen and examined, agree with above Still with significant bibasilar atelectasis- encouraged increased IS, Flutter Increase beta blocker  Elspeth C. Kerrin, MD [facility] [phone]

## 2023-07-08 NOTE — Inpatient Diabetes Management (Signed)
 Inpatient Diabetes Program Recommendations  AACE/ADA: New Consensus Statement on Inpatient Glycemic Control (2015)  Target Ranges:  Prepandial:   less than 140 mg/dL      Peak postprandial:   less than 180 mg/dL (1-2 hours)      Critically ill patients:  140 - 180 mg/dL   Lab Results  Component Value Date   GLUCAP 215 (H) 07/05/2023   HGBA1C 6.2 (H) 07/03/2023    Review of Glycemic Control  Diabetes history: DM2 Outpatient Diabetes medications: Farxiga  5 mg daily Current orders for Inpatient glycemic control: Farxiga  5 mg daily  Inpatient Diabetes Program Recommendations:   Please consider: -Glycemic control order set with 0-9 units tid, 0-5 units hs  Thank you, Jalana Moore E. Rosendo Couser, RN, MSN, CDCES  Diabetes Coordinator Inpatient Glycemic Control Team Team Pager [phone] (8am-5pm) 07/08/2023 11:21 AM

## 2023-07-08 NOTE — Progress Notes (Signed)
 CARDIAC REHAB PHASE I   Pt resting in bed, feeling fatigued and tired. Pt declined walk or oob to chair for now due to fatigue. Encouraged mobility as able today and frequent IS use. Will continue to follow.   [id number] Vaughn Asberry Hacking, RN BSN 07/08/2023 9:56 AM

## 2023-07-08 NOTE — Progress Notes (Signed)
 Physical Therapy Treatment Patient Details Name: [name] MRN: [id number] DOB: [birth year] Today's Date: 07/08/2023   History of Present Illness 68 yo female admitted 06/23/23 to [facility] with chest pain, HTN emergency and elevated troponin. 1/2 transfer to [facility] for left heart cath. 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 PMHx: T2DM, neuropathy, HTN, HLD    PT Comments  Pt slowly progressing towards goals. Decreased distance with gait this session due to nausea. Pt is Mod I for bed mobility and sit to stand. Supervision for gait due to fatigue and nausea. Donning gown and going to sitting pt required verbal cues to maintain sternal precautions. Due to pt current functional status, home set up and available assistance at home no recommended skilled physical therapy services at this time on discharge from acute care hospital setting. Will continue to follow in acute setting in order to ensure that pt returns home with decreased risk for falls, injury, re-hospitalization and improved activity tolerance.      If plan is discharge home, recommend the following: A little help with walking and/or transfers;A little help with bathing/dressing/bathroom     Equipment Recommendations  Rolling walker (2 wheels);BSC/3in1       Precautions / Restrictions Precautions Precautions: Sternal Precaution Booklet Issued: No Precaution Comments: reviewed sternal precautions Restrictions Weight Bearing Restrictions Per Provider Order: Yes RUE Weight Bearing Per Provider Order: Non weight bearing LUE Weight Bearing Per Provider Order: Non weight bearing     Mobility  Bed Mobility Overal bed mobility: Modified Independent Bed Mobility: Sidelying to Sit, Sit to Sidelying Rolling: Modified independent (Device/Increase time) Sidelying to sit: Modified independent (Device/Increase time)     Sit to sidelying: Modified independent (Device/Increase time)      Transfers Overall transfer level: Modified independent Equipment used:  Rolling walker (2 wheels) Transfers: Sit to/from Stand Sit to Stand: Modified independent (Device/Increase time)           General transfer comment: good technique with pillow 1x verbal cues to maintain sternal precautions.    Ambulation/Gait Ambulation/Gait assistance: Supervision Gait Distance (Feet): 60 Feet Assistive device: Rolling walker (2 wheels) Gait Pattern/deviations: Step-through pattern, Decreased stride length, Trunk flexed Gait velocity: decreased Gait velocity interpretation: 1.31 - 2.62 ft/sec, indicative of limited community ambulator   General Gait Details: cues for posture and proximity to RW. Pt became nauseous with emesis in blue bag after gait. BP WNL, HR WNL, O2 sats in 90's.        Balance Overall balance assessment: Mild deficits observed, not formally tested            Cognition Arousal: Alert Behavior During Therapy: WFL for tasks assessed/performed Overall Cognitive Status: Within Functional Limits for tasks assessed       General Comments: some difficulty functionally maintaining sternal precautions. Pt is able to state 3/4 sternal precautions. Difficulty remembering no lifting.           General Comments General comments (skin integrity, edema, etc.): incision dry and intact. No drainage noted. Pt became nauseous after gait.      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) PT Goal Formulation: With patient Time For Goal Achievement: 07/12/23 Potential to Achieve Goals: Good Progress towards PT goals: Progressing toward goals    Frequency    Min 1X/week      PT Plan  Continue with  current POC        AM-PAC PT 6 Clicks Mobility   Outcome Measure  Help needed turning from your back to your side while in a flat bed without using bedrails?: None Help  needed moving from lying on your back to sitting on the side of a flat bed without using bedrails?: None Help needed moving to and from a bed to a chair (including a wheelchair)?: None Help needed standing up from a chair using your arms (e.g., wheelchair or bedside chair)?: None Help needed to walk in hospital room?: A Little Help needed climbing 3-5 steps with a railing? : A Little 6 Click Score: 22    End of Session Equipment Utilized During Treatment: Gait belt Activity Tolerance: Patient tolerated treatment well;Other (comment) (pt was limited due to nausea) Patient left: in bed;with call bell/phone within reach Nurse Communication: Mobility status PT Visit Diagnosis: Other abnormalities of gait and mobility (R26.89)     Time: 8550-8496 PT Time Calculation (min) (ACUTE ONLY): 14 min  Charges:    $Therapeutic Activity: 8-22 mins PT General Charges $$ ACUTE PT VISIT: 1 Visit                     Dorothyann Maier, DPT, CLT  Acute Rehabilitation Services Office: [phone] (Secure chat preferred)    Dorothyann VEAR Maier 07/08/2023, 4:26 PM

## 2023-07-08 NOTE — Plan of Care (Signed)

## 2023-07-08 NOTE — TOC Progression Note (Signed)
 Transition of Care [facility]) - Progression Note    Patient Details  Name: [name] MRN: [id number] Date of Birth: 1956-05-14  [facility]) CM/SW Contact  Arlana JINNY Moats, LCSWA Phone Number:[phone] 07/08/2023, 12:37 PM  Clinical Narrative:   HF CSW met with pt at bedside. Pt stated that she was doing alright and did not have any questions at this time. CSW will continue to follow up with pt.   TOC will continue following.     Expected Discharge Plan: Home w Home Health Services Barriers to Discharge: Continued Medical Work up  Expected Discharge Plan and Services In-house Referral: NA Discharge Planning Services: CM Consult Post Acute Care Choice: Home Health Living arrangements for the past 2 months: Hotel/Motel                                       Social Determinants of Health (SDOH) Interventions SDOH Screenings   Food Insecurity: No Food Insecurity (06/24/2023)  Housing: Low Risk  (06/24/2023)  Transportation Needs: No Transportation Needs (06/24/2023)  Utilities: At Risk (06/24/2023)  Alcohol Screen: Low Risk  (05/02/2023)  Depression (PHQ2-9): Low Risk  (05/02/2023)  Financial Resource Strain: Low Risk  (05/02/2023)  Physical Activity: Inactive (05/02/2023)  Social Connections: Moderately Integrated (06/24/2023)  Recent Concern: Social Connections - Moderately Isolated (05/02/2023)  Stress: Stress Concern Present (05/02/2023)  Tobacco Use: Medium Risk (07/03/2023)  Health Literacy: Adequate Health Literacy (05/02/2023)    Readmission Risk Interventions     No data to display

## 2023-07-08 NOTE — Progress Notes (Signed)
 Mobility Specialist Progress Note:   07/08/23 1214  Mobility  Activity Ambulated with assistance in room;Ambulated with assistance in hallway  Level of Assistance Contact guard assist, steadying assist  Assistive Device Front wheel walker  Distance Ambulated (ft) 60 ft  Mobility Referral Yes  Mobility visit 1 Mobility  Mobility Specialist Start Time (ACUTE ONLY) 1130  Mobility Specialist Stop Time (ACUTE ONLY) 1140  Mobility Specialist Time Calculation (min) (ACUTE ONLY) 10 min   Pt received sitting in chair, attempting to change gowns. Agreeable to ambulate. Monitored HR throughout. Required Cga and Rw for safety. Tolerated well, limited by fatigue and weakness. Returned pt to bed, lying comfortably, all needs met.  Pre mobility: HR 120 bpm During Session: HR 132 bpm After Session: HR 104 bpm   Anddy Wingert Mobility Specialist Please contact via Special Educational Needs Teacher or  Rehab office at [phone]

## 2023-07-09 ENCOUNTER — Inpatient Hospital Stay (HOSPITAL_COMMUNITY): Payer: Medicare Other

## 2023-07-09 MED ORDER — METHYLPREDNISOLONE 4 MG PO TBPK
8.0000 mg | ORAL_TABLET | Freq: Every evening | ORAL | Status: AC
  Administered 2023-07-09: 8 mg via ORAL

## 2023-07-09 MED ORDER — LIDOCAINE HCL 1 % IJ SOLN
INTRAMUSCULAR | Status: AC
  Filled 2023-07-09: qty 20

## 2023-07-09 MED ORDER — METHYLPREDNISOLONE 4 MG PO TBPK: 4.0000 mg | ORAL_TABLET | Freq: Four times a day (QID) | ORAL | Status: DC

## 2023-07-09 MED ORDER — METHYLPREDNISOLONE 4 MG PO TBPK
4.0000 mg | ORAL_TABLET | Freq: Three times a day (TID) | ORAL | Status: DC
  Administered 2023-07-10: 4 mg via ORAL

## 2023-07-09 MED ORDER — METHYLPREDNISOLONE 4 MG PO TBPK
4.0000 mg | ORAL_TABLET | ORAL | Status: AC
  Administered 2023-07-09: 4 mg via ORAL

## 2023-07-09 MED ORDER — METHYLPREDNISOLONE 4 MG PO TBPK: ORAL_TABLET | ORAL | Status: DC

## 2023-07-09 MED ORDER — METHYLPREDNISOLONE 4 MG PO TBPK: 8.0000 mg | ORAL_TABLET | Freq: Every evening | ORAL | Status: DC

## 2023-07-09 MED ORDER — METHYLPREDNISOLONE 4 MG PO TBPK
8.0000 mg | ORAL_TABLET | Freq: Every morning | ORAL | Status: AC
  Administered 2023-07-09: 8 mg via ORAL
  Filled 2023-07-09: qty 21

## 2023-07-09 NOTE — Progress Notes (Addendum)
      [address]       [place] [address]             [phone]      6 Days Post-Op Procedure(s) (LRB): CORONARY ARTERY BYPASS GRAFTING X 4, USING LEFT INTERNAL MAMMARY ARTERY AND ENDOSCOPICALLY HARVESTED RIGHT SAPHENOUS VEIN GRAFT (N/A) TRANSESOPHAGEAL ECHOCARDIOGRAM (TEE) (N/A)  Subjective:  Patient doing well.  She does have some pain along her left lateral chest and under her breast.  Nauseated at times.  + ambulation  Objective: Vital signs in last 24 hours: Temp:  [97.6 F (36.4 C)-98.3 F (36.8 C)] 97.9 F (36.6 C) (01/14 0542) Pulse Rate:  [86-112] 90 (01/14 0542) Cardiac Rhythm: Normal sinus rhythm (01/13 2027) Resp:  [15-20] 17 (01/14 0542) BP: (110-127)/(77-88) 127/85 (01/14 0542) SpO2:  [94 %-100 %] 98 % (01/14 0542) Weight:  [46.2 kg] 46.2 kg (01/14 0533)  General appearance: alert, cooperative, and no distress Heart: regular rate and rhythm Lungs: diminished breath sounds left base, CTA on Right Abdomen: soft, non-tender; bowel sounds normal; no masses,  no organomegaly Extremities: no swelling, ecchymosis RLE from [facility] Wound: clean and dry, healing w/o evidence of infection  Lab Results: Recent Labs    07/07/23 0312  WBC 12.4*  HGB 9.5*  HCT 28.9*  PLT 244   BMET: No results for input(s): NA, K, CL, CO2, GLUCOSE, BUN, CREATININE, CALCIUM  in the last 72 hours.  PT/INR: No results for input(s): LABPROT, INR in the last 72 hours. ABG    Component Value Date/Time   PHART 7.321 (L) 07/03/2023 1943   HCO3 24.1 07/03/2023 1943   TCO2 26 07/03/2023 1943   ACIDBASEDEF 2.0 07/03/2023 1943   O2SAT 99 07/03/2023 1943   CBG (last 3)  No results for input(s): GLUCAP in the last 72 hours.  Assessment/Plan: S/P Procedure(s) (LRB): CORONARY ARTERY BYPASS GRAFTING X 4, USING LEFT INTERNAL MAMMARY ARTERY AND ENDOSCOPICALLY HARVESTED RIGHT SAPHENOUS VEIN GRAFT (N/A) TRANSESOPHAGEAL ECHOCARDIOGRAM (TEE) (N/A)  CV-  Sinus Tach, rates improved in the 90s- continue Lopressor  at 50 mg BID, hydrochlorothiazide /cozaar  Pulm- off oxygen, pain along left chest likely due to continued atelectasis/effusion.. will repeat CXR today Renal- no edema on exam weight remains stable DM- home farxiga  removed, cbgs controlled Dispo- patient stable, will get baseline CXR, likely for d/c home today   LOS: 11 days    Rocky Shad, PA-C 07/09/2023  Patient seen and examined, agree with above CXR reviewed- LLL atelectasis + effusion- will ask IR to do thoracentesis if there is enough fluid present' Steroid taper Likely home tomorrow  Elspeth C. Kerrin, MD [facility] [phone]

## 2023-07-09 NOTE — Plan of Care (Signed)

## 2023-07-09 NOTE — Care Management Important Message (Signed)
 Important Message  Patient Details  Name: [name] MRN: [id number] Date of Birth: May 02, 1956   Important Message Given:  Yes - Medicare IM     [name] 07/09/2023, 10:20 AM

## 2023-07-09 NOTE — Progress Notes (Signed)
  Limited US of the chest showed only a small amount of fluid with no window to allow for safe approach for thoracentesis.  Risks outweigh benefit at this time.   Kirsti Mcalpine S Kasem Mozer PA-C 07/09/2023 10:22 AM

## 2023-07-09 NOTE — Progress Notes (Signed)
 CARDIAC REHAB PHASE I    Pt resting in bed, ready to take a nap. Has ambulated in hallway once today. Reports tolerating well. Encouraged continued mobility and IS use. Post OHS education including site care, restrictions, heart healthy diabetic diet, sternal precautions, IS use at home, home needs at discharge, exercise guidelines and CRP2 reviewed. All questions and concerns addressed. Will refer to [facility] for CRP2.   [phone] Vaughn Asberry Hacking, RN BSN 07/09/2023 12:15 PM

## 2023-07-09 NOTE — Progress Notes (Signed)
 Mobility Specialist Progress Note:   07/09/23 1009  Mobility  Activity Ambulated with assistance in hallway  Level of Assistance Standby assist, set-up cues, supervision of patient - no hands on  Assistive Device Front wheel walker  Distance Ambulated (ft) 160 ft  RUE Weight Bearing Per Provider Order NWB  LUE Weight Bearing Per Provider Order NWB  Activity Response Tolerated well  Mobility Referral Yes  Mobility visit 1 Mobility  Mobility Specialist Start Time (ACUTE ONLY) 0915  Mobility Specialist Stop Time (ACUTE ONLY) [id number]  Mobility Specialist Time Calculation (min) (ACUTE ONLY) 13 min   Pre Mobility: 98 HR During Mobility: 128 HR   Pt received in bed, agreeable to mobility. Pt was able to maintain sternal precautions without assistance during session. C/o fatigue but stated she feels much better today compared to yesterday. Pt left in chair infront of sink for NT to assist with bath.  Brown Husband  Mobility Specialist Please contact via [employer] office at [phone]

## 2023-07-10 ENCOUNTER — Inpatient Hospital Stay (HOSPITAL_COMMUNITY): Payer: Medicare Other

## 2023-07-10 ENCOUNTER — Other Ambulatory Visit (HOSPITAL_COMMUNITY): Payer: Self-pay

## 2023-07-10 MED ORDER — METHYLPREDNISOLONE 4 MG PO TABS
ORAL_TABLET | ORAL | 0 refills | Status: DC
Start: 2023-07-10 — End: 2024-02-06
  Filled 2023-07-10: qty 13, 5d supply, fill #0

## 2023-07-10 MED ORDER — ASPIRIN 81 MG PO TBEC: 81.0000 mg | DELAYED_RELEASE_TABLET | Freq: Every day | ORAL | Status: AC

## 2023-07-10 MED ORDER — METOPROLOL TARTRATE 50 MG PO TABS
50.0000 mg | ORAL_TABLET | Freq: Two times a day (BID) | ORAL | 3 refills | Status: DC
  Filled 2023-07-10: qty 60, 30d supply, fill #0

## 2023-07-10 MED ORDER — CLOPIDOGREL BISULFATE 75 MG PO TABS
75.0000 mg | ORAL_TABLET | Freq: Every day | ORAL | 3 refills | Status: DC
  Filled 2023-07-10 – 2023-08-22 (×2): qty 30, 30d supply, fill #0
  Filled 2023-08-22 – 2023-09-30 (×2): qty 30, 30d supply, fill #1

## 2023-07-10 MED ORDER — OXYCODONE HCL 5 MG PO TABS
5.0000 mg | ORAL_TABLET | Freq: Four times a day (QID) | ORAL | 0 refills | Status: AC | PRN
  Filled 2023-07-10: qty 28, 7d supply, fill #0

## 2023-07-10 NOTE — Progress Notes (Signed)
      [address]       [place] [address]             [phone]      7 Days Post-Op Procedure(s) (LRB): CORONARY ARTERY BYPASS GRAFTING X 4, USING LEFT INTERNAL MAMMARY ARTERY AND ENDOSCOPICALLY HARVESTED RIGHT SAPHENOUS VEIN GRAFT (N/A) TRANSESOPHAGEAL ECHOCARDIOGRAM (TEE) (N/A)  Subjective:  Patient is without complaints.  There was not enough fluid to drain yesterday so thoracentesis wasn't performed.  Patient states she had a really good coughing spell last night.  Instructed on importance of her continuing to use her breathing devices at discharge  Objective: Vital signs in last 24 hours: Temp:  [97.5 F (36.4 C)-98.2 F (36.8 C)] 97.9 F (36.6 C) (01/15 0432) Pulse Rate:  [79-99] 79 (01/15 0004) Cardiac Rhythm: Normal sinus rhythm (01/14 2030) Resp:  [15-20] 16 (01/15 0432) BP: (111-133)/(79-86) 127/86 (01/15 0432) SpO2:  [95 %-100 %] 95 % (01/15 0432) Weight:  [44.3 kg] 44.3 kg (01/15 0432)  General appearance: alert, cooperative, and no distress Heart: regular rate and rhythm Lungs: diminished breath sounds left base Abdomen: soft, non-tender; bowel sounds normal; no masses,  no organomegaly Extremities: no edema present, + ecchymosis RLE Wound: clean and dry  Lab Results: No results for input(s): "WBC", "HGB", "HCT", "PLT" in the last 72 hours. BMET: No results for input(s): "NA", "K", "CL", "CO2", "GLUCOSE", "BUN", "CREATININE", "CALCIUM " in the last 72 hours.  PT/INR: No results for input(s): "LABPROT", "INR" in the last 72 hours. ABG    Component Value Date/Time   PHART 7.321 (L) 07/03/2023 1943   HCO3 24.1 07/03/2023 1943   TCO2 26 07/03/2023 1943   ACIDBASEDEF 2.0 07/03/2023 1943   O2SAT 99 07/03/2023 1943   CBG (last 3)  No results for input(s): "GLUCAP" in the last 72 hours.  Assessment/Plan: S/P Procedure(s) (LRB): CORONARY ARTERY BYPASS GRAFTING X 4, USING LEFT INTERNAL MAMMARY ARTERY AND ENDOSCOPICALLY HARVESTED RIGHT  SAPHENOUS VEIN GRAFT (N/A) TRANSESOPHAGEAL ECHOCARDIOGRAM (TEE) (N/A)  CV- Sinus Tach, rate much improved, BP is controlled- continue Lopressor , Cozaar /hydrochlorothiazide   Pulm- persistent left base atelectasis, not enough fluid to perform thoracentesis.Aaron Aas continue IS, steroid taper Dm- sugars mostly controlled, will likely be elevated with brief use of steroids, continue home medications Dispo- patient stable will d/c home today   LOS: 12 days    Gates Kasal, PA-C 07/10/2023

## 2023-07-10 NOTE — Progress Notes (Signed)
 CARDIAC REHAB PHASE I   Pt ready for discharge home. Postop OHS education completed. Referral for CRP2 sent to [facility].  [phone] Ronny Colas, RN BSN 07/10/2023 9:11 AM

## 2023-07-10 NOTE — Progress Notes (Signed)
 Discharge instructions given. Patient verbalized understanding and all questions were answered.  ?

## 2023-07-10 NOTE — Progress Notes (Signed)
 Removed chest tube sutures per order. Pt tolerated well.   Lawson Radar, RN

## 2023-07-10 NOTE — TOC Transition Note (Signed)
 Transition of Care (TOC) - Discharge Note Sherin Dingwall RN, BSN Transitions of Care Unit 4E- RN Case Manager See Treatment Team for direct phone #   Patient Details  Name: [name] MRN: [id number] Date of Birth: [birth year]  Transition of Care [facility]) CM/SW Contact:  Rox Cope, RN Phone Number: 07/10/2023, 9:58 AM   Clinical Narrative:    Pt stable for transition home today, No HH needs noted (TCTS office made referral to [facility] for any HH needs- liaison updated no needs at discharge for [facility]- [facility] will close referral).  Order for DME- RW- requested from [facility] on 1/13- per discharge nurse- RW has not been delivered yet to bedside.   Call made to [facility] liaison- Zack- per notes in their system- [facility] team attempted to reach pt on 1/13- however pt informed them she had company and could not talk to them at that time- [facility] to f/u with pt this am regarding cost for RW and insurance. - CM has requested that DME be delivered to pt at the discharge lounge where pt will be waiting for her ride.   Pt's family to come provide transport this afternoon.   Final next level of care: Home/Self Care Barriers to Discharge: Barriers Resolved   Patient Goals and CMS Choice Patient states their goals for this hospitalization and ongoing recovery are:: plan to return to motel with support of niece. CMS Medicare.gov Compare Post Acute Care list provided to:: Patient Choice offered to / list presented to : Patient      Discharge Placement               Home        Discharge Plan and Services Additional resources added to the After Visit Summary for   In-house Referral: NA Discharge Planning Services: CM Consult Post Acute Care Choice: Home Health          DME Arranged: Walker rolling DME Agency: [facility] Date DME Agency Contacted: 07/08/23 Time DME Agency Contacted: 1520 Representative spoke with at DME Agency: Zack HH Arranged: NA HH Agency: [facility] ([facility])        Social Drivers of Health (SDOH) Interventions SDOH Screenings   Food Insecurity: No Food Insecurity (06/24/2023)  Housing: Low Risk  (06/24/2023)  Transportation Needs: No Transportation Needs (06/24/2023)  Utilities: At Risk (06/24/2023)  Alcohol Screen: Low Risk  (05/02/2023)  Depression (PHQ2-9): Low Risk  (05/02/2023)  Financial Resource Strain: Low Risk  (05/02/2023)  Physical Activity: Inactive (05/02/2023)  Social Connections: Moderately Integrated (06/24/2023)  Recent Concern: Social Connections - Moderately Isolated (05/02/2023)  Stress: Stress Concern Present (05/02/2023)  Tobacco Use: Medium Risk (07/03/2023)  Health Literacy: Adequate Health Literacy (05/02/2023)     Readmission Risk Interventions    07/10/2023    9:58 AM  Readmission Risk Prevention Plan  Transportation Screening Complete  Home Care Screening Complete  Medication Review (RN CM) Complete

## 2023-07-11 ENCOUNTER — Telehealth: Payer: Self-pay

## 2023-07-11 ENCOUNTER — Other Ambulatory Visit: Payer: Self-pay

## 2023-07-11 ENCOUNTER — Other Ambulatory Visit (INDEPENDENT_AMBULATORY_CARE_PROVIDER_SITE_OTHER): Payer: Self-pay | Admitting: Primary Care

## 2023-07-11 MED ORDER — DAPAGLIFLOZIN PROPANEDIOL 5 MG PO TABS: 5.0000 mg | ORAL_TABLET | Freq: Every day | ORAL | 0 refills | Status: DC

## 2023-07-11 NOTE — Progress Notes (Signed)
Patient discharged from the hospital yesterday 1/15 s/p CABG with Dr. Dorris Fetch. She states that she was unsure if she had all mediations listed on her discharge summary. She states that she does not have [place] 5mg  that was continued on her medication list. She states that she was advised to "throw out" all medications that she was taking before surgery. So she did not have it. Advised we would refill this medication but could resume refills through Cardiology in future. She acknowledged receipt. She requested medication sent to [facility] on [address].

## 2023-07-12 ENCOUNTER — Telehealth: Payer: Self-pay

## 2023-07-12 NOTE — Transitions of Care (Post Inpatient/ED Visit) (Signed)
07/12/2023  Name: [name] MRN: [id number] DOB: 02/24/1956  Today's TOC FU Call Status: Today's TOC FU Call Status:: Successful TOC FU Call Completed TOC FU Call Complete Date: 07/11/23 Patient's Name and Date of Birth confirmed.  Transition Care Management Follow-up Telephone Call Date of Discharge: 07/10/23 Discharge Facility: [facility]) Type of Discharge: Inpatient Admission Primary Inpatient Discharge Diagnosis:: CABG x 4 How have you been since you were released from the hospital?: Better Any questions or concerns?: No  Items Reviewed: Did you receive and understand the discharge instructions provided?: Yes Medications obtained,verified, and reconciled?: Yes (Medications Reviewed) Any new allergies since your discharge?: No Dietary orders reviewed?: Yes Type of Diet Ordered:: Heart Healthy Do you have support at home?: Yes People in Home: child(ren), adult, other relative(s) Name of Support/Comfort Primary Source: Brother, nieces and nephews  Medications Reviewed Today: Medications Reviewed Today     Reviewed by Wyline Mood, RN (Case Manager) on 07/11/23 at 1645  Med List Status: <None>   Medication Order Taking? Sig Documenting Provider Last Dose Status Informant  aspirin EC 81 MG tablet [id number]  Take 1 tablet (81 mg total) by mouth daily. Barrett, Rae Roam, PA-C  Active   clopidogrel (PLAVIX) 75 MG tablet [id number] Yes Take 1 tablet (75 mg total) by mouth daily. Barrett, Rae Roam, PA-C Taking Active   dapagliflozin propanediol (FARXIGA) 5 MG TABS tablet [id number] Yes Take 1 tablet (5 mg total) by mouth daily. Loreli Slot, MD Taking Active   dicyclomine (BENTYL) 10 MG capsule [id number] Yes Take 1 capsule (10 mg total) by mouth 3 (three) times daily before meals. Meryl Dare, MD Taking Active Self, Pharmacy Records  DULoxetine (CYMBALTA) 30 MG capsule [id number] Yes Take 1 capsule (30 mg total) by mouth daily. [name], NP Taking  Active Self, Pharmacy Records  losartan-hydrochlorothiazide [facility]) 100-25 MG tablet [id number] Yes Take 1 tablet by mouth daily. [name], NP Taking Active Self, Pharmacy Records  methocarbamol (ROBAXIN) 500 MG tablet [id number] Yes Take 1 tablet (500 mg total) by mouth every 8 (eight) hours as needed for muscle spasms. Debby Freiberg, NP Taking Active Self, Pharmacy Records  methylPREDNISolone (MEDROL) 4 MG tablet [id number] Yes To finish hospital course: 07/10/23 Take 1 tablet at supper, and 2 tabs at bedtime. on 07/11/23, take 1 tablet at breakfast, lunch, supper and bedtime (4 doses). On 1/17 take 1 tab at breakfast, lunch and bedtime. (3 doses) On 1/18, take 1 tab at breakfast and bedtime (2 doses), then on 1/19 take 1 tab at breakfast. Barrett, Erin R, PA-C Taking Active   metoprolol tartrate (LOPRESSOR) 50 MG tablet [id number] Yes Take 1 tablet (50 mg total) by mouth 2 (two) times daily. Barrett, Rae Roam, PA-C Taking Active   ondansetron (ZOFRAN) 4 MG tablet [id number] Yes Take 1 tablet (4 mg total) by mouth every 8 (eight) hours as needed for nausea or vomiting. Meryl Dare, MD Taking Active Self, Pharmacy Records  ondansetron [facility]) 8 MG tablet [id number] Yes Take one prior to each prep dose. Meryl Dare, MD Taking Active Self, Pharmacy Records  oxyCODONE (OXY IR/ROXICODONE) 5 MG immediate release tablet [id number] Yes Take 1 tablet (5 mg total) by mouth every 6 (six) hours as needed for up to 7 days for severe pain (pain score 7-10). Barrett, Rae Roam, PA-C Taking Active   pantoprazole (PROTONIX) 40 MG tablet [id number] Yes TAKE ONE TABLET (40mg  total) BY MOUTH DAILY AT 9AM  Patient taking differently: Take  40 mg by mouth daily as needed (for heartburn).   Meryl Dare, MD Taking Active Self, Pharmacy Records  pregabalin [facility]) 50 MG capsule [id number] Yes Take 50 mg by mouth 3 (three) times daily. [provider] Taking Active Self, Pharmacy Records  rosuvastatin  (CRESTOR) 40 MG tablet [id number] Yes Take 1 tablet (40 mg total) by mouth daily. [name], NP Taking Active Self, Pharmacy Records            Home Care and Equipment/Supplies: Were Home Health Services Ordered?: No Any new equipment or medical supplies ordered?: Yes Name of Medical supply agency?: [employer] - rolling walker.  Patient declined as she has one that her mother used recently and no longer needs. Were you able to get the equipment/medical supplies?: Yes Do you have any questions related to the use of the equipment/supplies?: No  Functional Questionnaire: Do you need assistance with bathing/showering or dressing?: Yes (Will need occass. assistance/supervision while her sternal incision heals) Do you need assistance with meal preparation?: Yes (Will initially need occass. assistance/supervision while her sternal incision heals) Do you need assistance with eating?: No (Indep after set up) Do you have difficulty maintaining continence: No Do you need assistance with getting out of bed/getting out of a chair/moving?: Yes Do you have difficulty managing or taking your medications?: No  Follow up appointments reviewed: PCP Follow-up appointment confirmed?: Yes Date of PCP follow-up appointment?: 08/05/23 Follow-up Provider: Gwinda Passe, NP Specialist Hospital Follow-up appointment confirmed?: Yes Date of Specialist follow-up appointment?: 07/24/23 Follow-Up Specialty Provider:: 1/29 Cardiology - Ronie Spies.  Dr Charlett Lango on 07/30/23 -   Patient to go to Radiology first for xray then have appt withDr. Henderickson Do you need transportation to your follow-up appointment?: No Do you understand care options if your condition(s) worsen?: Yes-patient verbalized understanding  SDOH Interventions Today    Flowsheet Row Most Recent Value  SDOH Interventions   Food Insecurity Interventions Intervention Not Indicated  Housing Interventions Intervention Not Indicated   Transportation Interventions Intervention Not Indicated  Utilities Interventions Intervention Not Indicated       Goals Addressed             This Visit's Progress    Transition of Care       Transition of Care Current Barriers:  Medication management  Diet/Nutrition/Food Resources  Provider appointments  Equipment/DME/Functional/Safety  Knowledge Deficits related to the post-CABG surgical recovery plan at home   Chronic Disease Management support and education needs related to CAD and HTN   RNCM Clinical Goal(s):  Patient will work with the Care Management team over the next 30 days to address Transition of Care Barriers: Medication Management Diet/Nutrition/Food Resources Provider appointments Equipment/DME/Functional/Safety Referral has been initiated for Cardiac Rehabilitation who will reach to the patient and schedule her first appointment.  RNCM and patient to monitor for outreach from them to set the appointment. take all medications exactly as prescribed and will call provider for medication related questions as evidenced by review of EMR and patient discussion to assess medication adherence. demonstrate understanding of rationale for each prescribed medication  attend all scheduled medical appointments:  as evidenced by review of the EMR. not experience hospital admission as evidenced by review of EMR within 30 days of hospital discharge.    Interventions: Evaluation of current treatment plan related to  self management and patient's adherence to plan as established by provider  Transitions of Care:  New goal. Durable Medical Equipment (DME) reviewed with patient/caregiver and options  discussed with patient/caregiver.  Patient states she opted not to purchase a rolling walker at this time as her mother recently passed and her walker was fairly new and in good working condition.  Advised patient should she determine she prefers a new walker to contact her health  insurance provider for the cost to her after insurance pays.  Patient states she knows how to contact her health plan by using the toll-free number on her insurance card. Doctor Visits  - discussed the importance of doctor visits.  Reviewed dates and times of her next 3 upcoming appointments including  her PCP, Gwinda Passe, NP, Cardiology provider Ronie Spies and Cardiac Surgeon, Dr. Charlett Lango.  Patient already has made arrangements to be transported to those appointments.   CAD Interventions: (Status:  New goal.) Long Term Goal Assessed understanding of CAD diagnosis Completed reviewed/reconciliation of patient's home medications against her discharge summary.  RNCM did advise that her hospital d/c summary called for her to take an Enteric-coated Aspirin 81 mg a day versus  the one she just purchased that does not contain the enteric-coating.  I explained to the patient the difference in the labeling on the box, as well as, the difference in how an enteric coated aspirin looks compared to one without.  The patient verbalized understanding and stated she would purchase the correct EC Aspirin first thing in the morning. Reviewed/discussed the Importance of taking all of her medications as prescribed and should she have any questions, concerns or unexpected/intolerable side effects to notify her doctor right away so she can be properly advised. Full review of medications and their functions deferred to next telephone call due to patient's fatigue level today.  Medication review today focused on ensuring she had the correct medication and dosing  and reinforce any medications that were stopped while in the hospital.   Surgery (CABG x 4):  (Status: New goal.) Short Term Goal Evaluation of current post operative self management plan related to Coronary artery bypass graft  surgery x 4 Reviewed/discussed hospital discharge summary and post-operative care instructions with patient/caregiver to  include:  activity restrictions:  avoiding straining, or increased exertional activity that causes chest pain, sob, dizziness or excessive weakness.  Increase your activity slowly per your tolerance level. Reviewed/discussed post - surgical incision care  including practicing washing your hands before and after touching your incisions.  Your surgeon has progressed you to showering, with someone nearby  in case you become faint or dizzy.  Do not let the shower beam straight down on your chest incision - you may turn at an angle or with your back to the shower stream. Confirmed availability of transportation to all appointments .  Patient's niece will be transporting the patient to her upcoming doctor appointments. Provided patient verbal education re:  signs and symptoms of infection and other changes in medical status that may require immediate medical attention including:  chest pain unrelieved by rest or prescribed medication;  if you notice you have a fever, or drainage, redness, warmth/heat coming from any of your surgical incisions or notice a portion of your surgical incision starting to re-open. ** Patient verbalized understanding to all of the patient education listed above **  Patient Goals/Self-Care Activities: Participate in Transition of Care Program/Attend [facility] scheduled calls Notify RN Care Manager of TOC call rescheduling needs Take all medications as prescribed Attend all scheduled provider appointments Call provider office for new concerns or questions  Today's telephone contact was kept brief per patient's request  as she states she still easily fatigues. RNCM and patient set another appointment date to complete the rest of her initial TOC assessment for participation in the [facility] 30 day program.  Follow Up Plan:  Telephone follow up appointment with care management team member scheduled for:  07/12/23 AT 1 PM. The patient has been provided with contact information for the care management  team and has been advised to call with any health related questions or concerns.          Nickalas Mccarrick Daphine Deutscher BSN, Programmer, systems / Transitions of Care [place] / [facility], [facility] Direct Dial Number:  [phone]

## 2023-07-12 NOTE — Telephone Encounter (Signed)
Patient contacted the office concerned about a burning pain at her incision site after a dream she had while asleep causing her to move unknowingly. She states that she has taken pain medication since and it has eased off. She states that her incisions look well and she does not have any popping or clicking in her sternum. Advised to continue taking medication and take her prescribed Lyrica for pain. She acknowledged receipt.

## 2023-07-12 NOTE — Telephone Encounter (Signed)
Rx discontinued on 08/16/22 due to change in dose. Requested Prescriptions  Pending Prescriptions Disp Refills   metoprolol succinate (TOPROL-XL) 25 MG 24 hr tablet [Pharmacy Med Name: METOPROLOL SUCC ER 25 MG TAB] 90 tablet 1    Sig: TAKE 1 TABLET (25 MG TOTAL) BY MOUTH DAILY.     Cardiovascular:  Beta Blockers Passed - 07/12/2023  9:25 AM      Passed - Last BP in normal range    BP Readings from Last 1 Encounters:  07/10/23 117/84         Passed - Last Heart Rate in normal range    Pulse Readings from Last 1 Encounters:  07/10/23 95         Passed - Valid encounter within last 6 months    Recent Outpatient Visits           2 months ago Diabetes mellitus type 2 in nonobese [facility])   [place] [facility] [name], NP   7 months ago Hospital discharge follow-up   [place] [facility] [name], NP   11 months ago Hospital discharge follow-up   [place] [facility] [name], NP   1 year ago Type 2 diabetes mellitus without complication, without long-term current use of insulin (HCC)   [place] [facility] [name], NP   1 year ago Diabetes mellitus type 2 in nonobese [facility])   [place] [facility] [name], NP       Future Appointments             In 1 week Dunn, Tacey Ruiz, PA-C  [facility]   In 3 weeks Randa Evens, Kinnie Scales, NP [facility]   In 2 months Nahser, Deloris Ping, MD [facility]

## 2023-07-15 ENCOUNTER — Telehealth (INDEPENDENT_AMBULATORY_CARE_PROVIDER_SITE_OTHER): Payer: Self-pay

## 2023-07-15 NOTE — Telephone Encounter (Signed)
Copied from CRM [id number]. Topic: General - Other >> Jul 15, 2023  3:50 PM Everette C wrote: Reason for CRM: The patient has called to follow up on a previously submitted refill request of ondansetron (ZOFRAN) 8 MG tablet [[id number]]  Please contact the patient further when possible

## 2023-07-16 ENCOUNTER — Encounter (INDEPENDENT_AMBULATORY_CARE_PROVIDER_SITE_OTHER): Payer: Self-pay | Admitting: Primary Care

## 2023-07-18 ENCOUNTER — Telehealth: Payer: Self-pay | Admitting: Pharmacy Technician

## 2023-07-18 ENCOUNTER — Other Ambulatory Visit (HOSPITAL_COMMUNITY): Payer: Self-pay

## 2023-07-18 DIAGNOSIS — R2681 Unsteadiness on feet: Secondary | ICD-10-CM | POA: Diagnosis not present

## 2023-07-18 DIAGNOSIS — I200 Unstable angina: Secondary | ICD-10-CM | POA: Diagnosis not present

## 2023-07-18 NOTE — Telephone Encounter (Signed)
Pharmacy Patient Advocate Encounter   Received notification from CoverMyMeds that prior authorization for ONDANSETRON 4MG  is required/requested.   Insurance verification completed.   The patient is insured through [facility] ADVANTAGE/RX ADVANCE .   Per test claim: PA required; PA submitted to above mentioned insurance via CoverMyMeds Key/confirmation #/EOC [facility] Status is pending

## 2023-07-21 NOTE — Telephone Encounter (Signed)
Pharmacy Patient Advocate Encounter  Received notification from [facility] ADVANTAGE/RX ADVANCE that Prior Authorization for ONDASETRON 4MG  has been DENIED.  Full denial letter will be uploaded to the media tab. See denial reason below.

## 2023-07-22 ENCOUNTER — Encounter: Payer: Self-pay | Admitting: Physician Assistant

## 2023-07-22 NOTE — Progress Notes (Unsigned)
Cardiology Office Note    Date:  07/24/2023  ID:  [name], DOB [birth year], MRN [id number] PCP:  [name], NP  Cardiologist:  Kristeen Miss, MD  Electrophysiologist:  None   Chief Complaint: f/u CABG  History of Present Illness: .    [name] is a 68 y.o. 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 She remotely saw Dr. Tenny Craw in 2016. She was then previously seen by PCV (Dr. Melton Alar) for diagnosis of resistant HTN. Renal artery duplex 10/2021 showed no renal artery stenosis, + simple R kidney cyst. More recently she was admitted with chest pain, mildly elevated troponin, hypertensive urgency in setting of being without BP meds for 3 months. She was found to have EF 40-45% and multivessel CAD and underwent CABGx4 on 07/03/23. Full echo had otherwise shown G1DD, severe LAE. Intra-op TEE showed EF 45-50%. Post-op course notable for ABL anemia and L pleural effusion requiring thoracentesis attempt though not enough fluid to tap. She was recommended for DAPT given elevated enzymes.  She is seen back for follow-up overall doing well. She lost her mom a few months ago and originally attributed her anginal symptoms to stress but is glad she got checked out. She feels her breathing is overall doing better. She denies any CP or SOB. She is having trouble sleeping with insomnia, denies overt orthopnea. Initial BP 142/88 recheck 130/74 by me. She reports her home readings are similar to the initial value here. She reports her brain aneurysm is being followed by PCP.   Labwork independently reviewed: 06/2023 Hgb 9.5, plt ok, K 3.9, Cr 0.65, Mg 2.1 04/2023 LFTs ok KPN 04/2023 LDL 178, trig 142, HDL 64  ROS: .    Please see the history of present illness.  All other systems are reviewed  and otherwise negative.  Studies Reviewed: [name]    EKG:  EKG is ordered today, personally reviewed, demonstrating NSR 98bpm, nonspecific STTW changes  CV Studies: Cardiac studies reviewed are outlined and summarized above. Otherwise please see EMR for full report.   Current Reported Medications:.    Current Meds  Medication Sig   aspirin EC 81 MG tablet Take 1 tablet (81 mg total) by mouth daily.   clopidogrel (PLAVIX) 75 MG tablet Take 1 tablet (75 mg total) by mouth daily.   dapagliflozin propanediol (FARXIGA) 5 MG TABS tablet Take 1 tablet (5 mg total) by mouth daily.   dicyclomine (BENTYL) 10 MG capsule Take 1 capsule (10 mg total) by mouth 3 (three) times daily before meals.   DULoxetine (CYMBALTA) 30 MG capsule Take 1 capsule (30 mg total) by mouth daily.   losartan-hydrochlorothiazide (HYZAAR) 100-25 MG tablet Take 1 tablet by mouth daily.   methocarbamol (ROBAXIN) 500 MG tablet Take 1 tablet (500 mg total) by mouth every 8 (eight) hours as needed for muscle spasms.   methylPREDNISolone (MEDROL) 4 MG tablet To finish hospital course: 07/10/23 Take 1 tablet at supper, and 2 tabs at bedtime. on 07/11/23, take 1 tablet at breakfast, lunch, supper and bedtime (4 doses). On 1/17 take 1 tab at breakfast, lunch and bedtime. (3 doses) On 1/18, take 1 tab at breakfast and bedtime (2 doses), then on 1/19 take 1 tab at breakfast.   ondansetron (ZOFRAN) 4 MG tablet Take 1 tablet (4 mg total) by mouth every 8 (eight) hours  as needed for nausea or vomiting.   ondansetron (ZOFRAN) 8 MG tablet Take one prior to each prep dose.   pantoprazole (PROTONIX) 40 MG tablet TAKE ONE TABLET (40mg  total) BY MOUTH DAILY AT 9AM (Patient taking differently: Take 40 mg by mouth daily as needed (for heartburn).)   pregabalin (LYRICA) 50 MG capsule Take 50 mg by mouth 3 (three) times daily.   rosuvastatin (CRESTOR) 40 MG tablet Take 1 tablet (40 mg total) by mouth daily.   [DISCONTINUED] metoprolol tartrate (LOPRESSOR)  50 MG tablet Take 1 tablet (50 mg total) by mouth 2 (two) times daily.    Physical Exam:    VS:  BP 130/74   Pulse 98   Ht 5\' 1"  (1.549 m)   Wt 97 lb (44 kg)   SpO2 99%   BMI 18.33 kg/m    Wt Readings from Last 3 Encounters:  07/24/23 97 lb (44 kg)  07/10/23 97 lb 10.6 oz (44.3 kg)  05/02/23 106 lb (48.1 kg)    GEN: Well nourished, well developed in no acute distress NECK: No JVD; No carotid bruits CARDIAC: RRR, no murmurs, rubs, gallops, surgical incision c/d/i RESPIRATORY:  Decreased BS L base without rales, wheezing or rhonchi  ABDOMEN: Soft, non-tender, non-distended EXTREMITIES:  No edema; No acute deformity. Right radial cath site without hematoma or ecchymosis; good pulse.  Asessement and Plan:.    1. CAD s/p CABG - progressing well post CABG. Continue ASA, Plavix (DAPT was recommended by hospital team given elevated troponins). Titrate metoprolol to 100mg  BID for improved beta blockade/BP. Continue rosuvastatin, see below re: lipids. Regarding insomnia I have encouraged her to f/u PCP to discuss plan but did give OK for short course of low dose melatonin or doxylamine PRN. Will update CBC, TSH, CMET, lipid profile today.  2. Chronic HFmrEF, left pleural effusion - examination notable for probable mild residual L pleural effusion. Will rx Lasix 20mg  daily and KCl daily x 3 day- prescription written as daily PRN so that she will have on hand if the course needs to be extended after she sees her cardiac surgeon next week with post-op CXR. Low dose Lasix was chosen as she is also on concomitant thiazide diuretic - would not likely use this combination together long term if she ends up requiring maintenance therapy. Otherwise continue losartan-hydrochlorothiazide, Bea Graff. Consider reassessment of LVEF 3 months post-op. Can review closer in follow-up.  3. Essential HTN - suboptimal/borderline control noted. HR also high 90s. Will titrate metoprolol to 100mg  BID. Asked her to  follow her BP for 1 week at least 3 hours after medication and relay readings to our office. Get baseline TSH as well.  4. Hyperlipidemia - she has not eaten today. Check CMET/lipids today. If LDL remains uncontrolled on rosuvastatin 40mg  daily recommend referral to pharmD to consider PCSK9i.    Cardiac Rehabilitation Eligibility Assessment  The patient is ready to start cardiac rehabilitation pending clearance from the cardiac surgeon.    Disposition: F/u with me in 1 month to f/u BP, volume. I also encouraged her to discuss continued surveillance of her cerebral aneurysm with her PCP whom she identified as the person following this finding.  Signed, Laurann Montana, PA-C

## 2023-07-23 ENCOUNTER — Telehealth (HOSPITAL_COMMUNITY): Payer: Self-pay

## 2023-07-23 NOTE — Telephone Encounter (Signed)
Called and spoke with pt in regards to CR, pt stated she is interested. Pt also stated she does have insurance but was not able to provide information at this time.

## 2023-07-24 ENCOUNTER — Ambulatory Visit: Payer: PPO | Attending: Physician Assistant | Admitting: Physician Assistant

## 2023-07-24 ENCOUNTER — Encounter: Payer: Self-pay | Admitting: Physician Assistant

## 2023-07-24 VITALS — BP 130/74 | HR 98 | Ht 61.0 in | Wt 97.0 lb

## 2023-07-24 DIAGNOSIS — I10 Essential (primary) hypertension: Secondary | ICD-10-CM

## 2023-07-24 DIAGNOSIS — I5022 Chronic systolic (congestive) heart failure: Secondary | ICD-10-CM | POA: Diagnosis not present

## 2023-07-24 DIAGNOSIS — J90 Pleural effusion, not elsewhere classified: Secondary | ICD-10-CM | POA: Diagnosis not present

## 2023-07-24 DIAGNOSIS — E785 Hyperlipidemia, unspecified: Secondary | ICD-10-CM | POA: Diagnosis not present

## 2023-07-24 DIAGNOSIS — I2510 Atherosclerotic heart disease of native coronary artery without angina pectoris: Secondary | ICD-10-CM

## 2023-07-24 MED ORDER — METOPROLOL TARTRATE 100 MG PO TABS: 100.0000 mg | ORAL_TABLET | Freq: Two times a day (BID) | ORAL | 3 refills | Status: DC

## 2023-07-24 MED ORDER — POTASSIUM CHLORIDE CRYS ER 20 MEQ PO TBCR
20.0000 meq | EXTENDED_RELEASE_TABLET | Freq: Every day | ORAL | 3 refills | Status: DC | PRN
Start: 2023-07-24 — End: 2023-11-01

## 2023-07-24 MED ORDER — FUROSEMIDE 20 MG PO TABS: 20.0000 mg | ORAL_TABLET | Freq: Every day | ORAL | 3 refills | Status: DC | PRN

## 2023-07-24 NOTE — Patient Instructions (Signed)
Medication Instructions:  1.Increase metoprolol tartrate (Lopressor) to 100 mg twice daily. 2.Your Lasix/furosemide and potassium prescriptions were written as "daily as needed" but let's have you only take those for 3 days to start.  *If you need a refill on your cardiac medications before your next appointment, please call your pharmacy*  Lab Work: CMET, LIPIDS, CBC, TSH-TODAY If you have labs (blood work) drawn today and your tests are completely normal, you will receive your results only by: MyChart Message (if you have MyChart) OR A paper copy in the mail If you have any lab test that is abnormal or we need to change your treatment, we will call you to review the results.  Follow-Up: At [facility], you and your health needs are our priority.  As part of our continuing mission to provide you with exceptional heart care, we have created designated Provider Care Teams.  These Care Teams include your primary Cardiologist (physician) and Advanced Practice Providers (APPs -  Physician Assistants and Nurse Practitioners) who all work together to provide you with the care you need, when you need it.  Your next appointment:   1 month(s)  Provider:   Ronie Spies, PA-C        Other Instructions For your insomnia, I think it would be fine for you to try a short course of melatonin (I.e. 0.5-1mg  nightly) or Unisom (doxylamine) 1/2 tablet (12.5mg ) nightly, but I would also encourage you to see your primary care doctor to follow up as well.

## 2023-07-25 DIAGNOSIS — E785 Hyperlipidemia, unspecified: Secondary | ICD-10-CM | POA: Diagnosis not present

## 2023-07-25 DIAGNOSIS — I5022 Chronic systolic (congestive) heart failure: Secondary | ICD-10-CM | POA: Diagnosis not present

## 2023-07-25 DIAGNOSIS — I10 Essential (primary) hypertension: Secondary | ICD-10-CM | POA: Diagnosis not present

## 2023-07-25 DIAGNOSIS — I2510 Atherosclerotic heart disease of native coronary artery without angina pectoris: Secondary | ICD-10-CM | POA: Diagnosis not present

## 2023-07-25 DIAGNOSIS — J90 Pleural effusion, not elsewhere classified: Secondary | ICD-10-CM | POA: Diagnosis not present

## 2023-07-26 ENCOUNTER — Telehealth: Payer: Self-pay | Admitting: Physician Assistant

## 2023-07-26 DIAGNOSIS — I671 Cerebral aneurysm, nonruptured: Secondary | ICD-10-CM

## 2023-07-26 DIAGNOSIS — D691 Qualitative platelet defects: Secondary | ICD-10-CM

## 2023-07-26 DIAGNOSIS — E782 Mixed hyperlipidemia: Secondary | ICD-10-CM

## 2023-07-26 LAB — TSH: TSH: 2.28 u[IU]/mL (ref 0.450–4.500)

## 2023-07-26 LAB — COMPREHENSIVE METABOLIC PANEL
ALT: 14 [IU]/L (ref 0–32)
AST: 15 [IU]/L (ref 0–40)
Albumin: 4.3 g/dL (ref 3.9–4.9)
Alkaline Phosphatase: 168 [IU]/L — ABNORMAL HIGH (ref 44–121)
BUN/Creatinine Ratio: 30 — ABNORMAL HIGH (ref 12–28)
BUN: 19 mg/dL (ref 8–27)
Bilirubin Total: 0.2 mg/dL (ref 0.0–1.2)
CO2: 22 mmol/L (ref 20–29)
Calcium: 9.4 mg/dL (ref 8.7–10.3)
Chloride: 97 mmol/L (ref 96–106)
Creatinine, Ser: 0.63 mg/dL (ref 0.57–1.00)
Globulin, Total: 2.6 g/dL (ref 1.5–4.5)
Glucose: 206 mg/dL — ABNORMAL HIGH (ref 70–99)
Potassium: 3.8 mmol/L (ref 3.5–5.2)
Sodium: 140 mmol/L (ref 134–144)
Total Protein: 6.9 g/dL (ref 6.0–8.5)
eGFR: 97 mL/min/{1.73_m2} (ref >59)

## 2023-07-26 LAB — LIPID PANEL
Chol/HDL Ratio: 4.3 {ratio} (ref 0.0–4.4)
Cholesterol, Total: 251 mg/dL — ABNORMAL HIGH (ref 100–199)
HDL: 59 mg/dL (ref >39)
LDL Chol Calc (NIH): 157 mg/dL — ABNORMAL HIGH (ref 0–99)
Triglycerides: 193 mg/dL — ABNORMAL HIGH (ref 0–149)
VLDL Cholesterol Cal: 35 mg/dL (ref 5–40)

## 2023-07-26 LAB — CBC
Hematocrit: 36.2 % (ref 34.0–46.6)
Hemoglobin: 11.6 g/dL (ref 11.1–15.9)
MCH: 28.2 pg (ref 26.6–33.0)
MCHC: 32 g/dL (ref 31.5–35.7)
MCV: 88 fL (ref 79–97)
Platelets: 591 10*3/uL — ABNORMAL HIGH (ref 150–450)
RBC: 4.11 x10E6/uL (ref 3.77–5.28)
RDW: 14.5 % (ref 11.7–15.4)
WBC: 11.7 10*3/uL — ABNORMAL HIGH (ref 3.4–10.8)

## 2023-07-26 NOTE — Telephone Encounter (Signed)
Informed patient of results and verbal understanding expressed. Routed to PCP

## 2023-07-26 NOTE — Telephone Encounter (Addendum)
Please let pt know I discussed her 4mm brain aneurysm finding with Dr. Elease Hashimoto, primary cardiologist, since she is now on blood thinners for her recent heart attack/bypass. [Per literature review, cerebral aneurysms <68mm are at low risk of rupture, and data suggests "unruptured intracerebral ?n??ry?m should not be regarded as a contraindication to antiplatelet therapy for patients who have a clear indication for such medication."] We do feel that this finding needs ongoing monitoring. She thought PCP might be following this but I do not see this has been the case recently; they may not have been aware. Her labs also show that her blood sugar and cholesterol remain high. Her alk phos (liver/bone marker) is also elevated. White blood cell count slowly improving, platelet count up but not uncommon post CABG.  Recommendations: - please arrange CT angio head neck w wo contrast for monitoring of cerebral aneurysm - refer to Dr. Corliss Skains with IR for consultation to discuss future monitoring - refer to [facility]D lipid clinic for management of high cholesterol despite statin - follow-up PCP for blood sugar, alk phos level - otherwise continue plan as discussed.

## 2023-07-29 ENCOUNTER — Other Ambulatory Visit: Payer: Self-pay | Admitting: Thoracic Surgery (Cardiothoracic Vascular Surgery)

## 2023-07-29 DIAGNOSIS — Z951 Presence of aortocoronary bypass graft: Secondary | ICD-10-CM

## 2023-07-30 ENCOUNTER — Telehealth: Payer: Self-pay

## 2023-07-30 ENCOUNTER — Other Ambulatory Visit (INDEPENDENT_AMBULATORY_CARE_PROVIDER_SITE_OTHER): Payer: Self-pay | Admitting: Primary Care

## 2023-07-30 ENCOUNTER — Encounter: Payer: Self-pay | Admitting: Thoracic Surgery (Cardiothoracic Vascular Surgery)

## 2023-07-30 ENCOUNTER — Ambulatory Visit
Admission: RE | Admit: 2023-07-30 | Discharge: 2023-07-30 | Disposition: A | Discharge status: Home / Self Care | Payer: PPO | Source: Ambulatory Visit | Attending: Thoracic Surgery (Cardiothoracic Vascular Surgery) | Admitting: Thoracic Surgery (Cardiothoracic Vascular Surgery)

## 2023-07-30 ENCOUNTER — Ambulatory Visit (INDEPENDENT_AMBULATORY_CARE_PROVIDER_SITE_OTHER): Payer: Self-pay | Admitting: Thoracic Surgery (Cardiothoracic Vascular Surgery)

## 2023-07-30 VITALS — BP 144/92 | HR 80 | Resp 20 | Ht 61.0 in | Wt 100.4 lb

## 2023-07-30 DIAGNOSIS — J9811 Atelectasis: Secondary | ICD-10-CM | POA: Diagnosis not present

## 2023-07-30 DIAGNOSIS — Z951 Presence of aortocoronary bypass graft: Secondary | ICD-10-CM

## 2023-07-30 DIAGNOSIS — J90 Pleural effusion, not elsewhere classified: Secondary | ICD-10-CM | POA: Diagnosis not present

## 2023-07-30 NOTE — Telephone Encounter (Signed)
Patient is aware she is not to return to work (light duty) for another 2 weeks. She acknowledged receipt.

## 2023-07-30 NOTE — Progress Notes (Signed)
 [address]       [place] [address]             [phone]     HPI: [name] returns for a scheduled follow-up after recent coronary bypass grafting  [name] is a 68 year old woman with a past medical history significant for hypertension, hyperlipidemia, type 2 diabetes with diabetic neuropathy, brain aneurysm, reflux, and anxiety.  She presented with chest pain in the setting of a hypertensive emergency.  She had minimally elevated troponin.  Echocardiogram showed ejection fraction of 40 to 45%.  At catheterization she had severe three-vessel coronary disease.  She underwent coronary bypass grafting x 4 on 07/03/2023.  Postoperative course was notable for difficult to manage hypertension and possible left pleural effusion, although there was not enough fluid to drain.  Since discharge she has been feeling well.  She denies any recurrent anginal type symptoms.  Has only needed pain medication on a few occasions.  No issues with shortness of breath.  Past Medical History:  Diagnosis Date   Anemia    hx   Aneurysm (HCC)    small, on left side of brain    Anxiety    hx of   Arthritis    generalized   CAD (coronary artery disease)    Diabetes mellitus without complication (HCC)    diet control, pt denies- on medications   GERD (gastroesophageal reflux disease)    iwht certain foods/on meds/OTC PRN meds   Heart failure with mildly reduced ejection fraction (HFmrEF) (HCC)    Hyperlipidemia    on meds   Hypertension    on meds   TIA (transient ischemic attack)    pt unaware of this hx on 06/19/2017    Current Outpatient Medications  Medication Sig Dispense Refill   aspirin  EC 81 MG tablet Take 1 tablet (81 mg total) by mouth daily.     clopidogrel  (PLAVIX ) 75 MG tablet Take 1 tablet (75 mg total) by mouth daily. 30 tablet 3   dapagliflozin  propanediol (FARXIGA ) 5 MG TABS tablet Take 1 tablet (5 mg total) by mouth daily. 90 tablet 0    dicyclomine  (BENTYL ) 10 MG capsule Take 1 capsule (10 mg total) by mouth 3 (three) times daily before meals. 90 capsule 11   DULoxetine  (CYMBALTA ) 30 MG capsule Take 1 capsule (30 mg total) by mouth daily. 180 capsule 1   furosemide  (LASIX ) 20 MG tablet Take 1 tablet (20 mg total) by mouth daily as needed. 30 tablet 3   losartan -hydrochlorothiazide  (HYZAAR ) 100-25 MG tablet Take 1 tablet by mouth daily. 90 tablet 1   methocarbamol  (ROBAXIN ) 500 MG tablet Take 1 tablet (500 mg total) by mouth every 8 (eight) hours as needed for muscle spasms. 20 tablet 0   methylPREDNISolone  (MEDROL ) 4 MG tablet To finish hospital course: 07/10/23 Take 1 tablet at supper, and 2 tabs at bedtime. on 07/11/23, take 1 tablet at breakfast, lunch, supper and bedtime (4 doses). On 1/17 take 1 tab at breakfast, lunch and bedtime. (3 doses) On 1/18, take 1 tab at breakfast and bedtime (2 doses), then on 1/19 take 1 tab at breakfast. 13 tablet 0   metoprolol  tartrate (LOPRESSOR ) 100 MG tablet Take 1 tablet (100 mg total) by mouth 2 (two) times daily. 180 tablet 3   ondansetron  (ZOFRAN ) 4 MG tablet Take 1 tablet (4 mg total) by mouth every 8 (eight) hours as needed for nausea or vomiting. 90 tablet 11  ondansetron  (ZOFRAN ) 8 MG tablet Take one prior to each prep dose. 2 tablet 0   pantoprazole  (PROTONIX ) 40 MG tablet TAKE ONE TABLET (40mg  total) BY MOUTH DAILY AT 9AM (Patient taking differently: Take 40 mg by mouth daily as needed (for heartburn).) 30 tablet 0   potassium chloride  SA (KLOR-CON  M) 20 MEQ tablet Take 1 tablet (20 mEq total) by mouth daily as needed (when taking furosemide  (lasix )). 30 tablet 3   pregabalin (LYRICA) 50 MG capsule Take 50 mg by mouth 3 (three) times daily.     rosuvastatin  (CRESTOR ) 40 MG tablet Take 1 tablet (40 mg total) by mouth daily. 90 tablet 1   No current facility-administered medications for this visit.    Physical Exam BP (!) 144/92 (BP Location: Left Arm, Patient Position: Sitting, Cuff  Size: Small)   Pulse 80   Resp 20   Ht 5' 1 (1.549 m)   Wt 100 lb 6.4 oz (45.5 kg)   SpO2 100% Comment: RA  BMI 18.57 kg/m  68 year old woman in no acute distress Alert and oriented x 3 with no focal deficits Lungs diminished at left base but otherwise clear Cardiac regular rate and rhythm Sternum stable, incision well-healed No peripheral edema  Diagnostic Tests: CHEST - 2 VIEW   COMPARISON:  07/10/2023   FINDINGS: Interval decrease in left pleural effusion, with small residual. Improved atelectasis/consolidation at the left lung base. Right lung remains clear.   Heart size and mediastinal contours are within normal limits. CABG markers.   Cervical fixation hardware.   Sternotomy wires.   IMPRESSION: Decreased left pleural effusion and left basilar atelectasis/consolidation.     Electronically Signed   By: JONETTA Faes M.D.   On: 07/30/2023 15:21 I personally reviewed the chest x-ray images.  There is improved atelectasis/effusion at the left base but there does appear to be some elevation of the left hemidiaphragm.  Impression: [name] is a 68 year old woman with a past medical history significant for hypertension, hyperlipidemia, type 2 diabetes with diabetic neuropathy, brain aneurysm, reflux, anxiety, and three-vessel coronary disease with ischemic cardiomyopathy.  Three-vessel coronary disease-status post coronary bypass grafting x 4.  No recurrent angina.  Status post CABG-incisions are healing beautifully.  She is having minimal discomfort.  Exercise tolerance is good.  Advised her not to lift anything over 10 pounds for another 2 weeks.  After that she can gradually begin to increase.  She may begin driving on a limited basis.  Appropriate precautions were discussed.  She asked about returning to work on a light-duty basis.  Advised to wait at least 2 more weeks before attempting to do so.  Hypertension-blood pressure mildly elevated.  Was a severe  issue preoperatively.  Managed by cardiology.  Plan: Return in 6 months with PA lateral chest x-ray  Elspeth JAYSON Millers, MD [facility] [phone]

## 2023-08-05 ENCOUNTER — Ambulatory Visit (INDEPENDENT_AMBULATORY_CARE_PROVIDER_SITE_OTHER): Payer: Medicare Other | Admitting: Primary Care

## 2023-08-06 ENCOUNTER — Telehealth: Payer: Self-pay | Admitting: Physician Assistant

## 2023-08-06 NOTE — Telephone Encounter (Signed)
Pt calling wanting to know why she is scheduled for a CT

## 2023-08-06 NOTE — Telephone Encounter (Signed)
Spoke with patient and she wanted to know why she needed CT.  Per Dana's phone note on 1/31 CT is needed for cerebral aneurysm. Patient verbalized understanding

## 2023-08-07 ENCOUNTER — Other Ambulatory Visit (INDEPENDENT_AMBULATORY_CARE_PROVIDER_SITE_OTHER): Payer: Self-pay | Admitting: Primary Care

## 2023-08-08 NOTE — Telephone Encounter (Signed)
dc'd 08/16/22 by Madelaine Bhat CMA "reorder"  Requested Prescriptions  Refused Prescriptions Disp Refills   metoprolol succinate (TOPROL-XL) 25 MG 24 hr tablet [Pharmacy Med Name: METOPROLOL SUCC ER 25 MG TAB] 90 tablet 1    Sig: TAKE 1 TABLET (25 MG TOTAL) BY MOUTH DAILY.     Cardiovascular:  Beta Blockers Failed - 08/08/2023  1:50 PM      Failed - Last BP in normal range    BP Readings from Last 1 Encounters:  07/30/23 (!) 144/92         Passed - Last Heart Rate in normal range    Pulse Readings from Last 1 Encounters:  07/30/23 80         Passed - Valid encounter within last 6 months    Recent Outpatient Visits           3 months ago Diabetes mellitus type 2 in nonobese [facility])   [place] [facility] [name], NP   8 months ago Hospital discharge follow-up   [place] [facility] [name], NP   1 year ago Hospital discharge follow-up   [place] [facility] [name], NP   1 year ago Type 2 diabetes mellitus without complication, without long-term current use of insulin (HCC)   [place] [facility] [name], NP   1 year ago Diabetes mellitus type 2 in nonobese [facility])   [place] [facility] [name], NP       Future Appointments             Tomorrow [name], NP [place] [facility]   In 3 weeks Dunn, Tacey Ruiz, PA-C  [facility] at [facility], [facility]   In 1 month   [facility] at [facility], [facility]   In 1 month Nahser, Deloris Ping, MD [facility]

## 2023-08-09 ENCOUNTER — Ambulatory Visit (INDEPENDENT_AMBULATORY_CARE_PROVIDER_SITE_OTHER): Payer: PPO | Admitting: Primary Care

## 2023-08-09 ENCOUNTER — Encounter (INDEPENDENT_AMBULATORY_CARE_PROVIDER_SITE_OTHER): Payer: Self-pay | Admitting: Primary Care

## 2023-08-09 VITALS — BP 128/72 | HR 72 | Resp 16 | Ht 61.0 in | Wt 101.2 lb

## 2023-08-09 DIAGNOSIS — N898 Other specified noninflammatory disorders of vagina: Secondary | ICD-10-CM | POA: Diagnosis not present

## 2023-08-09 MED ORDER — FLUCONAZOLE 150 MG PO TABS: 150.0000 mg | ORAL_TABLET | Freq: Every day | ORAL | 1 refills | Status: DC

## 2023-08-11 NOTE — Progress Notes (Signed)
[facility]  [name], is a 68 y.o. female  WUJ:[id number]  NFA:[id number]  DOB - 1956/06/02  Chief Complaint  Patient presents with   Diabetes       Subjective:   [name] is a 68 y.o. female here today for an acute visit.  Patient actually here to make writer aware of most recent activities in her life.  1 she sold her mother's house and now living in the apartment too many memories to stay.  She is out of work due to her war scar as she showed me coronary artery bypass grafting x 4(left heart cath and coronary angiographic) she admitted having chest pains ignoring them so much going on in her life trying to help her special needs child mourning the passing of her mother neglecting what her body was telling her.  Also, with the lack of income she was not able to afford her medication and take them as prescribed.  Today do not de well in the past what we have to do is move forward she still been given another day another opportunity .  Explained do not try to overexert yourself do everything cardiology tells you to do and PCP will follow-up whenever she needs to be seen. She does have diabetes which is well-controlled A1c 6.2 (20-month ago she is on Farxiga 5 mg daily.  No problems updated.  Comprehensive ROS Pertinent positive and negative noted in HPI   No Known Allergies  Past Medical History:  Diagnosis Date   Anemia    hx   Aneurysm (HCC)    small, on left side of brain    Anxiety    hx of   Arthritis    generalized   CAD (coronary artery disease)    Diabetes mellitus without complication (HCC)    diet control, pt denies- on medications   GERD (gastroesophageal reflux disease)    iwht certain foods/on meds/OTC PRN meds   Heart failure with mildly reduced ejection fraction (HFmrEF) (HCC)    Hyperlipidemia    on meds   Hypertension    on meds   TIA (transient ischemic attack)    pt unaware of this hx on 06/19/2017    Current Outpatient  Medications on File Prior to Visit  Medication Sig Dispense Refill   aspirin EC 81 MG tablet Take 1 tablet (81 mg total) by mouth daily.     clopidogrel (PLAVIX) 75 MG tablet Take 1 tablet (75 mg total) by mouth daily. 30 tablet 3   dapagliflozin propanediol (FARXIGA) 5 MG TABS tablet Take 1 tablet (5 mg total) by mouth daily. 90 tablet 0   dicyclomine (BENTYL) 10 MG capsule Take 1 capsule (10 mg total) by mouth 3 (three) times daily before meals. 90 capsule 11   DULoxetine (CYMBALTA) 30 MG capsule Take 1 capsule (30 mg total) by mouth daily. 180 capsule 1   furosemide (LASIX) 20 MG tablet Take 1 tablet (20 mg total) by mouth daily as needed. 30 tablet 3   losartan-hydrochlorothiazide (HYZAAR) 100-25 MG tablet Take 1 tablet by mouth daily. 90 tablet 1   methocarbamol (ROBAXIN) 500 MG tablet Take 1 tablet (500 mg total) by mouth every 8 (eight) hours as needed for muscle spasms. 20 tablet 0   methylPREDNISolone (MEDROL) 4 MG tablet To finish hospital course: 07/10/23 Take 1 tablet at supper, and 2 tabs at bedtime. on 07/11/23, take 1 tablet at breakfast, lunch, supper and bedtime (4 doses). On 1/17 take 1 tab at  breakfast, lunch and bedtime. (3 doses) On 1/18, take 1 tab at breakfast and bedtime (2 doses), then on 1/19 take 1 tab at breakfast. 13 tablet 0   metoprolol tartrate (LOPRESSOR) 100 MG tablet Take 1 tablet (100 mg total) by mouth 2 (two) times daily. 180 tablet 3   pantoprazole (PROTONIX) 40 MG tablet TAKE ONE TABLET (40mg  total) BY MOUTH DAILY AT 9AM (Patient taking differently: Take 40 mg by mouth daily as needed (for heartburn).) 30 tablet 0   potassium chloride SA (KLOR-CON M) 20 MEQ tablet Take 1 tablet (20 mEq total) by mouth daily as needed (when taking furosemide (lasix)). 30 tablet 3   pregabalin (LYRICA) 50 MG capsule Take 50 mg by mouth 3 (three) times daily.     rosuvastatin (CRESTOR) 40 MG tablet Take 1 tablet (40 mg total) by mouth daily. 90 tablet 1   No current  facility-administered medications on file prior to visit.   Health Maintenance  Topic Date Due   Eye exam for diabetics  07/25/2018   Mammogram  01/03/2023   COVID-19 Vaccine (3 - 2024-25 season) 02/24/2023   Zoster (Shingles) Vaccine (2 of 2) 06/27/2023   Hemoglobin A1C  12/31/2023   Yearly kidney health urinalysis for diabetes  05/01/2024   Medicare Annual Wellness Visit  05/01/2024   Complete foot exam   06/11/2024   Yearly kidney function blood test for diabetes  07/24/2024   DEXA scan (bone density measurement)  11/12/2024   DTaP/Tdap/Td vaccine (2 - Td or Tdap) 04/26/2027   Colon Cancer Screening  02/13/2028   Pneumonia Vaccine  Completed   Flu Shot  Completed   Hepatitis C Screening  Completed   HPV Vaccine  Aged Out    Objective:   Vitals:   08/09/23 1115 08/09/23 1125 08/09/23 1213  BP: (!) 158/78 (!) 159/70 128/72  Pulse: 72    Resp: 16    SpO2: 100%    Weight: 101 lb 3.2 oz (45.9 kg)    Height: 5\' 1"  (1.549 m)       Physical Exam    Assessment & Plan   Vaginal itching -     Fluconazole; Take 1 tablet (150 mg total) by mouth daily.  Dispense: 1 tablet; Refill: 1     Patient have been counseled extensively about nutrition and exercise. Other issues discussed during this visit include: low cholesterol diet, weight control and daily exercise, foot care, annual eye examinations at Ophthalmology, importance of adherence with medications and regular follow-up. We also discussed long term complications of uncontrolled diabetes and hypertension.   Return in about 3 months (around 11/06/2023) for medical conditions.  The patient was given clear instructions to go to ER or return to medical center if symptoms don't improve, worsen or new problems develop. The patient verbalized understanding. The patient was told to call to get lab results if they haven't heard anything in the next week.   This note has been created with Transport planner. Any transcriptional errors are unintentional.   [name], NP 08/11/2023, 5:40 PM

## 2023-08-12 ENCOUNTER — Ambulatory Visit (HOSPITAL_COMMUNITY)
Admission: RE | Admit: 2023-08-12 | Discharge: 2023-08-12 | Disposition: A | Discharge status: Home / Self Care | Payer: PPO | Source: Ambulatory Visit | Attending: Physician Assistant | Admitting: Physician Assistant

## 2023-08-12 DIAGNOSIS — G9389 Other specified disorders of brain: Secondary | ICD-10-CM | POA: Diagnosis not present

## 2023-08-12 DIAGNOSIS — I6522 Occlusion and stenosis of left carotid artery: Secondary | ICD-10-CM | POA: Diagnosis not present

## 2023-08-12 DIAGNOSIS — I671 Cerebral aneurysm, nonruptured: Secondary | ICD-10-CM | POA: Diagnosis not present

## 2023-08-12 MED ORDER — IOHEXOL 350 MG/ML SOLN
75.0000 mL | Freq: Once | INTRAVENOUS | Status: AC | PRN
  Administered 2023-08-12: 75 mL via INTRAVENOUS

## 2023-08-14 ENCOUNTER — Telehealth (HOSPITAL_COMMUNITY): Payer: Self-pay

## 2023-08-14 ENCOUNTER — Encounter (HOSPITAL_COMMUNITY): Payer: Self-pay

## 2023-08-14 NOTE — Telephone Encounter (Signed)
 Attempted to call patient in regards to Cardiac Rehab - LM on VM Mailed letter

## 2023-08-19 ENCOUNTER — Emergency Department (HOSPITAL_COMMUNITY)
Admission: EM | Admit: 2023-08-19 | Discharge: 2023-08-19 | Disposition: A | Discharge status: Home / Self Care | Payer: PPO | Attending: Emergency Medicine | Admitting: Emergency Medicine

## 2023-08-19 ENCOUNTER — Telehealth: Payer: Self-pay

## 2023-08-19 ENCOUNTER — Emergency Department (HOSPITAL_COMMUNITY): Payer: PPO

## 2023-08-19 ENCOUNTER — Other Ambulatory Visit: Payer: Self-pay

## 2023-08-19 ENCOUNTER — Encounter (HOSPITAL_COMMUNITY): Payer: Self-pay

## 2023-08-19 DIAGNOSIS — I509 Heart failure, unspecified: Secondary | ICD-10-CM | POA: Diagnosis not present

## 2023-08-19 DIAGNOSIS — R079 Chest pain, unspecified: Secondary | ICD-10-CM | POA: Diagnosis present

## 2023-08-19 DIAGNOSIS — I517 Cardiomegaly: Secondary | ICD-10-CM | POA: Diagnosis not present

## 2023-08-19 DIAGNOSIS — L739 Follicular disorder, unspecified: Secondary | ICD-10-CM | POA: Diagnosis not present

## 2023-08-19 DIAGNOSIS — Z7982 Long term (current) use of aspirin: Secondary | ICD-10-CM | POA: Diagnosis not present

## 2023-08-19 DIAGNOSIS — L662 Folliculitis decalvans: Secondary | ICD-10-CM | POA: Insufficient documentation

## 2023-08-19 DIAGNOSIS — J9811 Atelectasis: Secondary | ICD-10-CM | POA: Diagnosis not present

## 2023-08-19 DIAGNOSIS — R0789 Other chest pain: Secondary | ICD-10-CM | POA: Diagnosis not present

## 2023-08-19 DIAGNOSIS — I671 Cerebral aneurysm, nonruptured: Secondary | ICD-10-CM

## 2023-08-19 DIAGNOSIS — J90 Pleural effusion, not elsewhere classified: Secondary | ICD-10-CM | POA: Diagnosis not present

## 2023-08-19 LAB — BASIC METABOLIC PANEL
Anion gap: 9 (ref 5–15)
BUN: 15 mg/dL (ref 8–23)
CO2: 24 mmol/L (ref 22–32)
Calcium: 9.7 mg/dL (ref 8.9–10.3)
Chloride: 106 mmol/L (ref 98–111)
Creatinine, Ser: 0.72 mg/dL (ref 0.44–1.00)
GFR, Estimated: >60 mL/min (ref >60)
Glucose, Bld: 164 mg/dL — ABNORMAL HIGH (ref 70–99)
Potassium: 4.3 mmol/L (ref 3.5–5.1)
Sodium: 139 mmol/L (ref 135–145)

## 2023-08-19 LAB — CBC
HCT: 37.3 % (ref 36.0–46.0)
Hemoglobin: 11.6 g/dL — ABNORMAL LOW (ref 12.0–15.0)
MCH: 28.3 pg (ref 26.0–34.0)
MCHC: 31.1 g/dL (ref 30.0–36.0)
MCV: 91 fL (ref 80.0–100.0)
Platelets: 478 10*3/uL — ABNORMAL HIGH (ref 150–400)
RBC: 4.1 MIL/uL (ref 3.87–5.11)
RDW: 14.6 % (ref 11.5–15.5)
WBC: 11.7 10*3/uL — ABNORMAL HIGH (ref 4.0–10.5)
nRBC: 0 % (ref 0.0–0.2)

## 2023-08-19 LAB — TROPONIN I (HIGH SENSITIVITY): Troponin I (High Sensitivity): 9 ng/L (ref <18)

## 2023-08-19 MED ORDER — DOXYCYCLINE HYCLATE 100 MG PO CAPS: 100.0000 mg | ORAL_CAPSULE | Freq: Two times a day (BID) | ORAL | 0 refills | Status: DC

## 2023-08-19 MED ORDER — HYDROXYZINE HCL 25 MG PO TABS: 25.0000 mg | ORAL_TABLET | Freq: Four times a day (QID) | ORAL | 0 refills | Status: DC

## 2023-08-19 NOTE — ED Provider Notes (Signed)
 [place] EMERGENCY DEPARTMENT AT [facility] Provider Note   CSN: [id number] Arrival date & time: 08/19/23  0865     History  Chief Complaint  Patient presents with   Chest Pain    [name] is a 68 y.o. female.  68 year old female presents with constant left-sided chest discomfort.  Patient's been under a great deal of stress recently denies any anginal or CHF, qualities.  Also has secondary complaint of bumps in bilateral axilla where she has shaved.  No fever or chills.  Patient with recent open heart surgery and states to be doing well since then.  Denies any leg pain or swelling.  No treatment use prior to arrival and symptoms are better when she relaxes       Home Medications Prior to Admission medications   Medication Sig Start Date End Date Taking? Authorizing Provider  aspirin EC 81 MG tablet Take 1 tablet (81 mg total) by mouth daily. 07/10/23   Barrett, Rae Roam, PA-C  clopidogrel (PLAVIX) 75 MG tablet Take 1 tablet (75 mg total) by mouth daily. 07/10/23 07/09/24  Barrett, Denny Peon R, PA-C  dapagliflozin propanediol (FARXIGA) 5 MG TABS tablet Take 1 tablet (5 mg total) by mouth daily. 07/11/23   Loreli Slot, MD  dicyclomine (BENTYL) 10 MG capsule Take 1 capsule (10 mg total) by mouth 3 (three) times daily before meals. 02/04/23   Meryl Dare, MD  DULoxetine (CYMBALTA) 30 MG capsule Take 1 capsule (30 mg total) by mouth daily. 05/02/23   [name], NP  fluconazole (DIFLUCAN) 150 MG tablet Take 1 tablet (150 mg total) by mouth daily. 08/09/23   [name], NP  furosemide (LASIX) 20 MG tablet Take 1 tablet (20 mg total) by mouth daily as needed. 07/24/23   Dunn, Tacey Ruiz, PA-C  losartan-hydrochlorothiazide (HYZAAR) 100-25 MG tablet Take 1 tablet by mouth daily. 05/02/23   [name], NP  methocarbamol (ROBAXIN) 500 MG tablet Take 1 tablet (500 mg total) by mouth every 8 (eight) hours as needed for muscle spasms. 06/14/22    Debby Freiberg, NP  methylPREDNISolone (MEDROL) 4 MG tablet To finish hospital course: 07/10/23 Take 1 tablet at supper, and 2 tabs at bedtime. on 07/11/23, take 1 tablet at breakfast, lunch, supper and bedtime (4 doses). On 1/17 take 1 tab at breakfast, lunch and bedtime. (3 doses) On 1/18, take 1 tab at breakfast and bedtime (2 doses), then on 1/19 take 1 tab at breakfast. 07/10/23   Barrett, Erin R, PA-C  metoprolol tartrate (LOPRESSOR) 100 MG tablet Take 1 tablet (100 mg total) by mouth 2 (two) times daily. 07/24/23   Dunn, Tacey Ruiz, PA-C  pantoprazole (PROTONIX) 40 MG tablet TAKE ONE TABLET (40mg  total) BY MOUTH DAILY AT 9AM Patient taking differently: Take 40 mg by mouth daily as needed (for heartburn). 06/20/22   Meryl Dare, MD  potassium chloride SA (KLOR-CON M) 20 MEQ tablet Take 1 tablet (20 mEq total) by mouth daily as needed (when taking furosemide (lasix)). 07/24/23   Dunn, Tacey Ruiz, PA-C  pregabalin (LYRICA) 50 MG capsule Take 50 mg by mouth 3 (three) times daily. 04/30/22   [provider]  rosuvastatin (CRESTOR) 40 MG tablet Take 1 tablet (40 mg total) by mouth daily. 05/02/23   [name], NP      Allergies    Patient has no known allergies.    Review of Systems   Review of Systems  All other  systems reviewed and are negative.   Physical Exam Updated Vital Signs BP (!) 176/120 (BP Location: Right Arm)   Pulse (!) 101   Temp 97.9 F (36.6 C)   Resp 15   Ht 1.549 m (5\' 1" )   Wt 45.9 kg   SpO2 100%   BMI 19.12 kg/m  Physical Exam Vitals and nursing note reviewed.  Constitutional:      General: She is not in acute distress.    Appearance: Normal appearance. She is well-developed. She is not toxic-appearing.  HENT:     Head: Normocephalic and atraumatic.  Eyes:     General: Lids are normal.     Conjunctiva/sclera: Conjunctivae normal.     Pupils: Pupils are equal, round, and reactive to light.  Neck:     Thyroid: No thyroid mass.     Trachea:  No tracheal deviation.  Cardiovascular:     Rate and Rhythm: Normal rate and regular rhythm.     Heart sounds: Normal heart sounds. No murmur heard.    No gallop.  Pulmonary:     Effort: Pulmonary effort is normal. No respiratory distress.     Breath sounds: Normal breath sounds. No stridor. No decreased breath sounds, wheezing, rhonchi or rales.  Abdominal:     General: There is no distension.     Palpations: Abdomen is soft.     Tenderness: There is no abdominal tenderness. There is no rebound.  Musculoskeletal:        General: No tenderness. Normal range of motion.     Cervical back: Normal range of motion and neck supple.     Comments: Pustules noted and in bilateral axilla.  No lymphadenopathy appreciated  Skin:    General: Skin is warm and dry.     Findings: No abrasion or rash.  Neurological:     Mental Status: She is alert and oriented to person, place, and time. Mental status is at baseline.     GCS: GCS eye subscore is 4. GCS verbal subscore is 5. GCS motor subscore is 6.     Cranial Nerves: No cranial nerve deficit.     Sensory: No sensory deficit.     Motor: Motor function is intact.  Psychiatric:        Attention and Perception: Attention normal.        Speech: Speech normal.        Behavior: Behavior normal.     ED Results / Procedures / Treatments   Labs (all labs ordered are listed, but only abnormal results are displayed) Labs Reviewed  BASIC METABOLIC PANEL - Abnormal; Notable for the following components:      Result Value   Glucose, Bld 164 (*)    All other components within normal limits  CBC - Abnormal; Notable for the following components:   WBC 11.7 (*)    Hemoglobin 11.6 (*)    Platelets 478 (*)    All other components within normal limits  TROPONIN I (HIGH SENSITIVITY)    EKG EKG Interpretation Date/Time:  Monday August 19 2023 09:33:16 EST Ventricular Rate:  109 PR Interval:  150 QRS Duration:  82 QT Interval:  366 QTC  Calculation: 492 R Axis:   95  Text Interpretation: Sinus tachycardia Rightward axis Nonspecific T wave abnormality Abnormal ECG When compared with ECG of 24-Jul-2023 08:19, PREVIOUS ECG IS PRESENT Confirmed by Lorre Nick ([id number]) on 08/19/2023 10:37:36 AM  Radiology DG Chest 2 View Result Date: 08/19/2023 CLINICAL DATA:  Chest discomfort  EXAM: CHEST - 2 VIEW COMPARISON:  07/30/2023 FINDINGS: Median sternotomy for CABG. Cervical spine fixation. Midline trachea. Mild cardiomegaly. Left hemidiaphragm elevation and eventration posteriorly. Tiny left pleural effusion is similar. No pneumothorax. No congestive failure. Similar left base subsegmental atelectasis. IMPRESSION: Similar tiny left pleural effusion and adjacent left base atelectasis. No acute superimposed process. Electronically Signed   By: Jeronimo Greaves M.D.   On: 08/19/2023 10:10    Procedures Procedures    Medications Ordered in ED Medications - No data to display  ED Course/ Medical Decision Making/ A&P                                 Medical Decision Making Amount and/or Complexity of Data Reviewed Labs: ordered. Radiology: ordered.   Patient's EKG per interpretation shows normal sinus rhythm.  Unchanged from prior.  Patient's chest x-ray without significant findings.  Patient has evidence of likely cellulitis in her bilateral axilla.  Suspect some anxiety component to her symptoms.  Patient's troponin negative.  She is over 6 hours and having symptoms therefore ACS suspicion is very low.  Will prescribe Vistaril and she will see her cardiologist within next week.       Final Clinical Impression(s) / ED Diagnoses Final diagnoses:  None    Rx / DC Orders ED Discharge Orders     None         Lorre Nick, MD 08/19/23 1055

## 2023-08-19 NOTE — ED Triage Notes (Signed)
 Pt c/o chest pain across entire chest and a burning sensation in midsternal chest where the incision is from having open heart surgery a month ago. Pt c/o nausea. Pt denies SOB

## 2023-08-19 NOTE — Telephone Encounter (Signed)
 Called patient with result note from [facility] PA. Patient verbalized understanding. Patient has not been contacted by Dr. Fatima Sanger office, will place there referral again.   Patient also requested to speak to the girl she talked to from Cardiac Rehab. Informed her that a message would be sent to them.

## 2023-08-19 NOTE — Telephone Encounter (Signed)
-----   Message from Laurann Montana sent at 08/16/2023  7:47 AM EST ----- CT angio head/neck done to evaluate cerebral aneurysm which is stable. Good news. As we stated in a prior phone note <42mm is at low risk for rupture. Per 06/2023 phone notes we had referred to Dr. Corliss Skains with IR to follow this. Please find out if patient heard back from their office. It looks like the referral got placed under diagnosis abnormal platelets, needs to be under cerebral aneurysm. Thank you!

## 2023-08-21 ENCOUNTER — Other Ambulatory Visit (INDEPENDENT_AMBULATORY_CARE_PROVIDER_SITE_OTHER): Payer: Self-pay | Admitting: Primary Care

## 2023-08-22 ENCOUNTER — Other Ambulatory Visit (HOSPITAL_COMMUNITY): Payer: Self-pay

## 2023-08-22 NOTE — Telephone Encounter (Signed)
 No longer listed on current medication list Requested Prescriptions  Pending Prescriptions Disp Refills   metoprolol succinate (TOPROL-XL) 25 MG 24 hr tablet [Pharmacy Med Name: METOPROLOL SUCC ER 25 MG TAB] 90 tablet 1    Sig: TAKE 1 TABLET (25 MG TOTAL) BY MOUTH DAILY.     Cardiovascular:  Beta Blockers Failed - 08/22/2023  1:56 PM      Failed - Last BP in normal range    BP Readings from Last 1 Encounters:  08/19/23 (!) 150/97         Passed - Last Heart Rate in normal range    Pulse Readings from Last 1 Encounters:  08/19/23 99         Passed - Valid encounter within last 6 months    Recent Outpatient Visits           1 week ago Vaginal itching   [place] [facility] [name], NP   3 months ago Diabetes mellitus type 2 in nonobese [facility])   [place] [facility] [name], NP   8 months ago Hospital discharge follow-up   [place] [facility] [name], NP   1 year ago Hospital discharge follow-up   [place] [facility] [name], NP   1 year ago Type 2 diabetes mellitus without complication, without long-term current use of insulin (HCC)   [place] [facility] [name], NP       Future Appointments             In 3 weeks  [facility], [facility]   In 1 month Nahser, Deloris Ping, MD [facility], [facility]   In 1 month Dunn, Tacey Ruiz, PA-C [facility], [facility]   In 2 months Randa Evens, Kinnie Scales, NP  [facility]

## 2023-08-23 ENCOUNTER — Other Ambulatory Visit (INDEPENDENT_AMBULATORY_CARE_PROVIDER_SITE_OTHER): Payer: Self-pay | Admitting: Primary Care

## 2023-08-27 ENCOUNTER — Ambulatory Visit: Payer: Medicare Other | Admitting: Thoracic Surgery (Cardiothoracic Vascular Surgery)

## 2023-08-29 ENCOUNTER — Telehealth (HOSPITAL_COMMUNITY): Payer: Self-pay

## 2023-08-29 NOTE — Telephone Encounter (Signed)
 No response from in regards to Cardiac rehab.   Closed referral

## 2023-09-02 ENCOUNTER — Ambulatory Visit: Payer: PPO | Admitting: Physician Assistant

## 2023-09-09 ENCOUNTER — Encounter: Payer: Self-pay | Admitting: Pharmacist

## 2023-09-09 ENCOUNTER — Other Ambulatory Visit (HOSPITAL_BASED_OUTPATIENT_CLINIC_OR_DEPARTMENT_OTHER): Payer: Self-pay | Admitting: Pharmacist

## 2023-09-09 DIAGNOSIS — Z79899 Other long term (current) drug therapy: Secondary | ICD-10-CM

## 2023-09-09 MED ORDER — DAPAGLIFLOZIN PROPANEDIOL 5 MG PO TABS: 5.0000 mg | ORAL_TABLET | Freq: Every day | ORAL | 1 refills | Status: DC

## 2023-09-09 NOTE — Progress Notes (Signed)
 Patient appeared on insurance report for not passing the quality metrics in 2024: Medication Adherence for Diabetes (MAD)   Outreach to the patient was Successful. Last Farxiga filled on 08/21/23 but 06/28/2023 before that (both for 30-day). Called her pharmacy and requested 90-day fills if possible. Rxn resent.   Butch Penny, PharmD, Patsy [place], CPP Clinical Pharmacist [facility] [phone]

## 2023-09-11 ENCOUNTER — Telehealth: Payer: Self-pay | Admitting: Pharmacist

## 2023-09-11 NOTE — Progress Notes (Unsigned)
 Patient ID: [name]                 DOB: November 30, 1955                    MRN: [id number]      HPI: [name] is a 68 y.o. female patient referred to lipid clinic by Ronie Spies, PA-C. PMH is significant for HTN urgency, hx of TIA, CAD s/p CABG , hypertension, HFrEF, T2DM,  She remotely saw Dr. Tenny Craw in 2016. She was then previously seen by [facility] (Dr. Melton Alar) for diagnosis of resistant HTN. Renal artery duplex 10/2021 showed no renal artery stenosis, + simple R kidney cyst. More recently she was admitted with chest pain, mildly elevated troponin, hypertensive urgency in setting of being without BP meds for 3 months. She was found to have EF 40-45% and multivessel CAD and underwent CABGx4 on 07/03/23  Pt saw Dunn, NP for follow up on 07/24/23 BP was elevated heart rate 90 metoprolol dose was increased to 100 mg twice daily. Patient went to ED for atypical chest pain on 08/19/23 EKG was in normal sinus rhythm, troponin was -ve, hydroxyzine was prescribed for anxiety.    Short term non compliance reflected on fill hx for Crestor- she received only 30 days supply 01/15/2023 and refilled again 09/02/2023 for 90 days before July 2024 was refilling Crestor every 30 days   Patient report she had stopped rosuvastatin for short period of time and she restarted from Jan 2025. So the last lab reflect her LDL level off of all cholesterol medications. Pt reports she tolerates Crestor 40 mg well. She takes it regularly. She is improving on diet. Eats home cooked healthy meals that are low in salt and fat. She is working on cutting down on soda intake and juice intake.   Reviewed options for lowering LDL cholesterol, including ezetimibe, PCSK-9 inhibitors.  Discussed mechanisms of action, dosing, side effects and potential decreases in LDL cholesterol.  Also reviewed cost information and potential options for patient assistance.  Current Medications: Crestor 40 mg daily ( last fill 09/02/2023) non compliance  suspected as she received only 30 days supply 01/15/2023 and refilled again 09/02/2023 for 90 days  Intolerances: none  Risk Factors: hx of TIA, CAD s/p CABG(06/2023) , hypertension, HFrEF, T2DM,   LDL goal: <55 mg/dl  Last lab : LDL 409 mg/dl TG 811 mg/dl TC 914 - while on no lipid lowering agent   Diet: working on following heart healthy diet   Breakfast: oatmeal and bagel with cream cheese, coffee   Lunch: salads - little bit of dressing - cold cut sandwich  Dinner : meat and vegetables, potatoes, brown rice  Snack: popcorn, vegetables with ranch dressing  Drink: water, juices, soda   Exercise: walking - 30 min every day   Family History:  Relation Problem Comments  Mother Metallurgist) Arthritis   Dementia   Diabetes   Hypertension     Father (Deceased) Heart attack ( in early or mid 3's)    Heart disease     Sister (Alive) Diabetes   Hypertension     Sister Metallurgist) Diabetes   Heart disease    Hypertension     Brother (Deceased) Diabetes   Heart disease ( MI at age of 74)    Hypertension     Brother Metallurgist) Hypertension     Brother Metallurgist) Hypertension     Brother Metallurgist) Hypertension     Social History:  Alcohol:  none  Smoking: quit long time ago ( use to smoke when she was in high school)  Labs:  Lipid Panel     Component Value Date/Time   CHOL 251 (H) 07/25/2023 0749   TRIG 193 (H) 07/25/2023 0749   HDL 59 07/25/2023 0749   CHOLHDL 4.3 07/25/2023 0749   CHOLHDL 2.7 06/18/2017 1633   VLDL 12 06/18/2017 1633   LDLCALC 157 (H) 07/25/2023 0749   LABVLDL 35 07/25/2023 0749    Past Medical History:  Diagnosis Date   Anemia    hx   Aneurysm (HCC)    small, on left side of brain    Anxiety    hx of   Arthritis    generalized   CAD (coronary artery disease)    Diabetes mellitus without complication (HCC)    diet control, pt denies- on medications   GERD (gastroesophageal reflux disease)    iwht certain foods/on meds/OTC PRN meds   Heart failure  with mildly reduced ejection fraction (HFmrEF) (HCC)    Hyperlipidemia    on meds   Hypertension    on meds   TIA (transient ischemic attack)    pt unaware of this hx on 06/19/2017    Current Outpatient Medications on File Prior to Visit  Medication Sig Dispense Refill   aspirin EC 81 MG tablet Take 1 tablet (81 mg total) by mouth daily.     clopidogrel (PLAVIX) 75 MG tablet Take 1 tablet (75 mg total) by mouth daily. 30 tablet 3   dapagliflozin propanediol (FARXIGA) 5 MG TABS tablet Take 1 tablet (5 mg total) by mouth daily. 90 tablet 1   dicyclomine (BENTYL) 10 MG capsule Take 1 capsule (10 mg total) by mouth 3 (three) times daily before meals. 90 capsule 11   doxycycline (VIBRAMYCIN) 100 MG capsule Take 1 capsule (100 mg total) by mouth 2 (two) times daily. 20 capsule 0   DULoxetine (CYMBALTA) 30 MG capsule Take 1 capsule (30 mg total) by mouth daily. 180 capsule 1   fluconazole (DIFLUCAN) 150 MG tablet Take 1 tablet (150 mg total) by mouth daily. 1 tablet 1   furosemide (LASIX) 20 MG tablet Take 1 tablet (20 mg total) by mouth daily as needed. 30 tablet 3   hydrOXYzine (ATARAX) 25 MG tablet Take 1 tablet (25 mg total) by mouth every 6 (six) hours. 12 tablet 0   losartan-hydrochlorothiazide (HYZAAR) 100-25 MG tablet Take 1 tablet by mouth daily. 90 tablet 1   methocarbamol (ROBAXIN) 500 MG tablet Take 1 tablet (500 mg total) by mouth every 8 (eight) hours as needed for muscle spasms. 20 tablet 0   methylPREDNISolone (MEDROL) 4 MG tablet To finish hospital course: 07/10/23 Take 1 tablet at supper, and 2 tabs at bedtime. on 07/11/23, take 1 tablet at breakfast, lunch, supper and bedtime (4 doses). On 1/17 take 1 tab at breakfast, lunch and bedtime. (3 doses) On 1/18, take 1 tab at breakfast and bedtime (2 doses), then on 1/19 take 1 tab at breakfast. 13 tablet 0   metoprolol tartrate (LOPRESSOR) 100 MG tablet Take 1 tablet (100 mg total) by mouth 2 (two) times daily. 180 tablet 3    pantoprazole (PROTONIX) 40 MG tablet TAKE ONE TABLET (40mg  total) BY MOUTH DAILY AT 9AM (Patient taking differently: Take 40 mg by mouth daily as needed (for heartburn).) 30 tablet 0   potassium chloride SA (KLOR-CON M) 20 MEQ tablet Take 1 tablet (20 mEq total) by mouth daily as needed (when  taking furosemide (lasix)). 30 tablet 3   pregabalin (LYRICA) 50 MG capsule Take 50 mg by mouth 3 (three) times daily.     rosuvastatin (CRESTOR) 40 MG tablet Take 1 tablet (40 mg total) by mouth daily. 90 tablet 1   No current facility-administered medications on file prior to visit.    No Known Allergies  Assessment/Plan:  1. Hyperlipidemia -  Problem  Hyperlipidemia   Current Medications: Crestor 40 mg daily ( last fill 09/02/2023) non compliance suspected as she received only 30 days supply 01/15/2023 and refilled again 09/02/2023 for 90 days  Intolerances: none  Risk Factors: hx of TIA, CAD s/p CABG(06/2023) , hypertension, HFrEF, T2DM,   LDL goal: <55 mg/dl  Last lab : LDL 161 mg/dl TG 096 mg/dl TC 045 - while on no lipid lowering agent      Hyperlipidemia Assessment:  LDL goal: < 55 mg/dl last LDLc 409 mg/dl (81/1914)NWGNF not on any lipid lowering agent  From April 2024 pt had stopped taking Crestor - self adjusted change to medication  Restarted Crestor 40 mg post CABG  from Jan 2025 tolerates it well  Diet - working on to improve it. Walks 30 min most day of the week  Discussed next potential options (ezetimibe PCSK-9 inhibitors, bempedoic acid and inclisiran); cost, dosing efficacy, side effects  Reiterated importance of medication adherence   Plan: Continue taking current medications (Crestor 40 mg daily) start taking ezetimibe 10 mg daily  Pt to continue following healthy healthy diet and continue doing regular exercise Will apply for PA for PCSK9i; will inform patient upon approval  Lipid lab due in 2-3 months of therapy optimization     Thank you,  Carmela Hurt,  Pharm.D [facility] [address] [place], [place] [address]  Phone: [phone]; Fax: [phone]

## 2023-09-11 NOTE — Telephone Encounter (Signed)
 PA for PCSK9i requested

## 2023-09-12 ENCOUNTER — Telehealth: Payer: Self-pay | Admitting: Pharmacy Technician

## 2023-09-12 ENCOUNTER — Ambulatory Visit: Payer: PPO | Attending: Cardiology | Admitting: Pharmacist

## 2023-09-12 ENCOUNTER — Encounter: Payer: Self-pay | Admitting: Pharmacist

## 2023-09-12 ENCOUNTER — Other Ambulatory Visit (HOSPITAL_COMMUNITY): Payer: Self-pay

## 2023-09-12 DIAGNOSIS — E7849 Other hyperlipidemia: Secondary | ICD-10-CM | POA: Diagnosis not present

## 2023-09-12 DIAGNOSIS — E785 Hyperlipidemia, unspecified: Secondary | ICD-10-CM | POA: Diagnosis not present

## 2023-09-12 MED ORDER — EZETIMIBE 10 MG PO TABS: 10.0000 mg | ORAL_TABLET | Freq: Every day | ORAL | 3 refills | Status: DC

## 2023-09-12 NOTE — Telephone Encounter (Signed)
 Pharmacy Patient Advocate Encounter   Received notification from Pt Calls Messages that prior authorization for repatha is required/requested.   Insurance verification completed.   The patient is insured through [facility] ADVANTAGE/RX ADVANCE .   Per test claim: PA required; PA started via CoverMyMeds. KEY [id number] . Please see clinical question(s) below that I am not finding the answer to in her chart and advise.   Sent Vaishali: I am doing the prior auth and it is asking why the patient can not take zetia. I saw your note saying: "on high intensity statin LDL still elevated" do you want me to put that? Key: [id number] is on hold for now

## 2023-09-12 NOTE — Telephone Encounter (Signed)
 Submitted with this note " will be taking  Zetia as I am going to prescribe her today but response would be insufficient to lower LDL at goal"

## 2023-09-12 NOTE — Telephone Encounter (Signed)
 Hi, I am doing the prior auth and it is asking why the patient can not take zetia. I saw your note saying: "on high intensity statin LDL still elevated" do you want me to put that? Key: [id number] is on hold for now

## 2023-09-12 NOTE — Patient Instructions (Signed)
 Your Results:             Your most recent labs Goal  Total Cholesterol 251 < 200  Triglycerides 193 < 150  HDL (happy/good cholesterol) 59 > 40  LDL (lousy/bad cholesterol 157  < 55   Medication changes: start taking Zetia 10 mg daily and continue taking Crestor 40 mg daily  We will start the process to get Repatha covered by your insurance.  Once the prior authorization is complete, we will call you to let you know and confirm pharmacy information.       Repatha is a cholesterol medication that improved your body's ability to get rid of "bad cholesterol" known as LDL. It can lower your LDL up to 60%! It is an injection that is given under the skin every 2 weeks. The medication often requires a prior authorization from your insurance company. We will take care of submitting all the necessary information to your insurance company to get it approved. The most common side effects of Repatha include runny nose, symptoms of the common cold, rarely flu or flu-like symptoms, back/muscle pain in about 3-4% of the patients, and redness, pain, or bruising at the injection site.   Lab orders: We want to repeat labs after 2-3 months.  We will send you a lab order to remind you once we get closer to that time.

## 2023-09-12 NOTE — Assessment & Plan Note (Signed)
 Assessment:  LDL goal: < 55 mg/dl last LDLc 244 mg/dl (06/270)ZDGUY not on any lipid lowering agent  From April 2024 pt had stopped taking Crestor - self adjusted change to medication  Restarted Crestor 40 mg post CABG  from Jan 2025 tolerates it well  Diet - working on to improve it. Walks 30 min most day of the week  Discussed next potential options (ezetimibe PCSK-9 inhibitors, bempedoic acid and inclisiran); cost, dosing efficacy, side effects  Reiterated importance of medication adherence   Plan: Continue taking current medications (Crestor 40 mg daily) start taking ezetimibe 10 mg daily  Pt to continue following healthy healthy diet and continue doing regular exercise Will apply for PA for PCSK9i; will inform patient upon approval  Lipid lab due in 2-3 months of therapy optimization

## 2023-09-13 ENCOUNTER — Other Ambulatory Visit (INDEPENDENT_AMBULATORY_CARE_PROVIDER_SITE_OTHER): Payer: Self-pay | Admitting: Primary Care

## 2023-09-13 ENCOUNTER — Other Ambulatory Visit (HOSPITAL_COMMUNITY): Payer: Self-pay

## 2023-09-13 MED ORDER — REPATHA SURECLICK 140 MG/ML ~~LOC~~ SOAJ: 140.0000 mg | SUBCUTANEOUS | 3 refills | Status: DC

## 2023-09-13 NOTE — Telephone Encounter (Signed)
 Call to inform pt, LVM and MyChart sent

## 2023-09-13 NOTE — Telephone Encounter (Signed)
 Pharmacy Patient Advocate Encounter  Received notification from [facility] ADVANTAGE/RX ADVANCE that Prior Authorization for repatha has been APPROVED from 09/12/23 to 03/14/24. Ran test claim, Copay is $47.00. This test claim was processed through [facility]- copay amounts may vary at other pharmacies due to pharmacy/plan contracts, or as the patient moves through the different stages of their insurance plan.   PA #/Case ID/Reference #:  [id number]

## 2023-09-13 NOTE — Addendum Note (Signed)
 Addended by: Tylene Fantasia on: 09/13/2023 10:24 AM   Modules accepted: Orders

## 2023-09-25 ENCOUNTER — Other Ambulatory Visit (HOSPITAL_BASED_OUTPATIENT_CLINIC_OR_DEPARTMENT_OTHER): Payer: Self-pay | Admitting: Pharmacist

## 2023-09-25 DIAGNOSIS — E119 Type 2 diabetes mellitus without complications: Secondary | ICD-10-CM

## 2023-09-25 NOTE — Progress Notes (Signed)
 Patient appeared on insurance report for not passing the quality metrics in 2024: Medication Adherence for Diabetes (MAD)   Outreach to the patient's pharmacy was Successful. She continues to struggle with adherence to [place].  I sent a rxn in last month for a 90-ds of Farxiga. Call placed to pharmacy today and confirmed they are filling the Farxiga for the full 90-ds.   Butch Penny, PharmD, Patsy [place], CPP Clinical Pharmacist [facility] [phone]

## 2023-09-29 ENCOUNTER — Encounter: Payer: Self-pay | Admitting: Cardiovascular Disease

## 2023-09-29 NOTE — Progress Notes (Signed)
 No show  This encounter was created in error - please disregard.

## 2023-09-30 ENCOUNTER — Ambulatory Visit: Payer: Medicare Other | Attending: Cardiovascular Disease | Admitting: Cardiovascular Disease

## 2023-09-30 ENCOUNTER — Telehealth: Payer: Self-pay | Admitting: Cardiovascular Disease

## 2023-09-30 NOTE — Telephone Encounter (Signed)
 Patient wants to know if she can schedule cataract surger since having her procedure on 1/8.  Patient stated will need to know today as she has her doctor's visit tomorrow (4/8).

## 2023-09-30 NOTE — Telephone Encounter (Signed)
 Spoke with pt. Told her once she saw her Dr they could send the paper work and our [employer] team would work on that for her. Pt also stated she was supposed to see Dr Elease Hashimoto this morning and wanted to make a new appointment with him. Phone was disconnected during the call. Will send to scheduling to try to get a new appointment for her.

## 2023-10-01 ENCOUNTER — Encounter: Payer: Self-pay | Admitting: Cardiovascular Disease

## 2023-10-01 ENCOUNTER — Ambulatory Visit: Payer: Self-pay

## 2023-10-01 NOTE — Telephone Encounter (Signed)
  Chief Complaint: R arm pain Symptoms: R shoulder pain with movement Frequency: "more than a couple weeks" Pertinent Negatives: Patient denies fever, diaphoresis, N/V, SOB, difficulty breathing, numbness/tingling,  Disposition: [] ED /[] Urgent Care (no appt availability in office) / [x] Appointment(In office/virtual)/ []  [facility]/ [] Home Care/ [] Refused Recommended Disposition /[] [place] [facility]/ []  Follow-up with PCP Additional Notes:  Pt states this has been ongoing issue for more than a couple weeks. Feels "pop" when twisting, gets sharp pain at night, and also states that it is worse with movements. Pt states that she has hx of arthritis. No available appts that work with pt schedule. Pt scheduled at [facility] for tomorrow.  Copied from CRM [id number]. Topic: Clinical - Red Word Triage >> Oct 01, 2023  1:42 PM Ivette P wrote: [facility] that prompted transfer to Nurse Triage: right arm pain- wakening from sleep  up shoulder- down the arm. soreness. sharp pain. really bothering. Reason for Disposition  [1] MODERATE pain (e.g., interferes with normal activities) AND [2] present > 3 days  Answer Assessment - Initial Assessment Questions 1. ONSET: "When did the pain start?"     More than couple weeks 2. LOCATION: "Where is the pain located?"     R arm pain 3. PAIN: "How bad is the pain?" (Scale 1-10; or mild, moderate, severe)   - MILD (1-3): Doesn't interfere with normal activities.   - MODERATE (4-7): Interferes with normal activities (e.g., work or school) or awakens from sleep.   - SEVERE (8-10): Excruciating pain, unable to do any normal activities, unable to hold a cup of water.     Sometimes can be 9, currently it is 5 4. WORK OR EXERCISE: "Has there been any recent work or exercise that involved this part of the body?"     denies 5. CAUSE: "What do you think is causing the arm pain?"     Unsure, pt thinks maybe arthritis 6. OTHER SYMPTOMS: "Do you have any other  symptoms?" (e.g., neck pain, swelling, rash, fever, numbness, weakness)     Denies neck pain, numbness, tingling  Protocols used: Arm Pain-A-AH

## 2023-10-02 ENCOUNTER — Ambulatory Visit: Payer: Self-pay | Admitting: Medical

## 2023-10-05 ENCOUNTER — Emergency Department (HOSPITAL_COMMUNITY)
Admission: EM | Admit: 2023-10-05 | Discharge: 2023-10-06 | Disposition: A | Discharge status: Home / Self Care | Attending: Emergency Medicine | Admitting: Emergency Medicine

## 2023-10-05 ENCOUNTER — Emergency Department (HOSPITAL_COMMUNITY)

## 2023-10-05 ENCOUNTER — Other Ambulatory Visit: Payer: Self-pay

## 2023-10-05 ENCOUNTER — Encounter (HOSPITAL_COMMUNITY): Payer: Self-pay | Admitting: *Deleted

## 2023-10-05 DIAGNOSIS — I671 Cerebral aneurysm, nonruptured: Secondary | ICD-10-CM | POA: Diagnosis not present

## 2023-10-05 DIAGNOSIS — R0789 Other chest pain: Secondary | ICD-10-CM | POA: Diagnosis not present

## 2023-10-05 DIAGNOSIS — R000 Tachycardia, unspecified: Secondary | ICD-10-CM | POA: Diagnosis not present

## 2023-10-05 DIAGNOSIS — I10 Essential (primary) hypertension: Secondary | ICD-10-CM | POA: Insufficient documentation

## 2023-10-05 DIAGNOSIS — Z8679 Personal history of other diseases of the circulatory system: Secondary | ICD-10-CM | POA: Diagnosis not present

## 2023-10-05 DIAGNOSIS — Z7982 Long term (current) use of aspirin: Secondary | ICD-10-CM | POA: Diagnosis not present

## 2023-10-05 DIAGNOSIS — J9811 Atelectasis: Secondary | ICD-10-CM | POA: Diagnosis not present

## 2023-10-05 DIAGNOSIS — R519 Headache, unspecified: Secondary | ICD-10-CM

## 2023-10-05 DIAGNOSIS — I6523 Occlusion and stenosis of bilateral carotid arteries: Secondary | ICD-10-CM | POA: Diagnosis not present

## 2023-10-05 DIAGNOSIS — R9389 Abnormal findings on diagnostic imaging of other specified body structures: Secondary | ICD-10-CM | POA: Diagnosis not present

## 2023-10-05 DIAGNOSIS — R079 Chest pain, unspecified: Secondary | ICD-10-CM | POA: Insufficient documentation

## 2023-10-05 DIAGNOSIS — I700 Atherosclerosis of aorta: Secondary | ICD-10-CM | POA: Insufficient documentation

## 2023-10-05 DIAGNOSIS — I119 Hypertensive heart disease without heart failure: Secondary | ICD-10-CM | POA: Insufficient documentation

## 2023-10-05 DIAGNOSIS — Z7901 Long term (current) use of anticoagulants: Secondary | ICD-10-CM | POA: Insufficient documentation

## 2023-10-05 DIAGNOSIS — I672 Cerebral atherosclerosis: Secondary | ICD-10-CM | POA: Diagnosis not present

## 2023-10-05 LAB — CBC
HCT: 41.9 % (ref 36.0–46.0)
Hemoglobin: 13.4 g/dL (ref 12.0–15.0)
MCH: 27.4 pg (ref 26.0–34.0)
MCHC: 32 g/dL (ref 30.0–36.0)
MCV: 85.7 fL (ref 80.0–100.0)
Platelets: 336 10*3/uL (ref 150–400)
RBC: 4.89 MIL/uL (ref 3.87–5.11)
RDW: 13.1 % (ref 11.5–15.5)
WBC: 9.8 10*3/uL (ref 4.0–10.5)
nRBC: 0 % (ref 0.0–0.2)

## 2023-10-05 LAB — BASIC METABOLIC PANEL WITH GFR
Anion gap: 12 (ref 5–15)
BUN: 15 mg/dL (ref 8–23)
CO2: 25 mmol/L (ref 22–32)
Calcium: 9.6 mg/dL (ref 8.9–10.3)
Chloride: 101 mmol/L (ref 98–111)
Creatinine, Ser: 0.77 mg/dL (ref 0.44–1.00)
GFR, Estimated: >60 mL/min (ref >60)
Glucose, Bld: 183 mg/dL — ABNORMAL HIGH (ref 70–99)
Potassium: 3.4 mmol/L — ABNORMAL LOW (ref 3.5–5.1)
Sodium: 138 mmol/L (ref 135–145)

## 2023-10-05 LAB — TROPONIN I (HIGH SENSITIVITY): Troponin I (High Sensitivity): 8 ng/L (ref <18)

## 2023-10-05 LAB — BRAIN NATRIURETIC PEPTIDE: B Natriuretic Peptide: 53.4 pg/mL (ref 0.0–100.0)

## 2023-10-05 NOTE — ED Provider Triage Note (Cosign Needed Addendum)
 Emergency Medicine Provider Triage Evaluation Note  [name] , a 68 y.o. female  was evaluated in triage.  Pt complains of chest pain and headache that started yesterday, left-sided.  Patient with history of cerebral aneurysm without rupture in the past, concern for persistent HA prompting ED visit.  No shortness of breath, 54-month status post CABG.  Review of Systems  Positive: As above, floaters Negative: Syncope shortness of breath blurry double vision  Physical Exam  BP (!) 163/107 (BP Location: Right Arm)   Pulse 96   Temp 97.9 F (36.6 C)   Resp 16   Ht 5\' 1"  (1.549 m)   Wt 45.9 kg   SpO2 98%   BMI 19.12 kg/m  Gen:   Awake, no distress   Resp:  Normal effort  MSK:   Moves extremities without difficulty  Other:  Nonfocal neuro exam, cardiopulmonary exam unremarkable, well healing CABG wound  Medical Decision Making  Medically screening exam initiated at 11:18 PM.  Appropriate orders placed.  [name] was informed that the remainder of the evaluation will be completed by another provider, this initial triage assessment does not replace that evaluation, and the importance of remaining in the ED until their evaluation is complete.  Safe for waiting room, CT visualized.   This chart was dictated using voice recognition software, Dragon. Despite the best efforts of this provider to proofread and correct errors, errors may still occur which can change documentation meaning.    Kae Oram, PA-C 10/05/23 2339    Kae Oram, PA-C 10/05/23 2358

## 2023-10-05 NOTE — ED Triage Notes (Signed)
 The pt has been having chest pain her bp has been high since yesterday  worse today  she has pain in the lt side of her head with floaters she has a history of lt brain aneurysm and she had bypass surgery 3 months ago  she has been under stress also

## 2023-10-06 ENCOUNTER — Emergency Department (HOSPITAL_COMMUNITY)

## 2023-10-06 DIAGNOSIS — R519 Headache, unspecified: Secondary | ICD-10-CM | POA: Diagnosis not present

## 2023-10-06 DIAGNOSIS — I700 Atherosclerosis of aorta: Secondary | ICD-10-CM | POA: Diagnosis not present

## 2023-10-06 DIAGNOSIS — I672 Cerebral atherosclerosis: Secondary | ICD-10-CM | POA: Diagnosis not present

## 2023-10-06 DIAGNOSIS — I671 Cerebral aneurysm, nonruptured: Secondary | ICD-10-CM | POA: Diagnosis not present

## 2023-10-06 DIAGNOSIS — I6523 Occlusion and stenosis of bilateral carotid arteries: Secondary | ICD-10-CM | POA: Diagnosis not present

## 2023-10-06 LAB — RESP PANEL BY RT-PCR (RSV, FLU A&B, COVID)  RVPGX2
Influenza A by PCR: NEGATIVE
Influenza B by PCR: NEGATIVE
Resp Syncytial Virus by PCR: NEGATIVE
SARS Coronavirus 2 by RT PCR: NEGATIVE

## 2023-10-06 LAB — TROPONIN I (HIGH SENSITIVITY): Troponin I (High Sensitivity): 7 ng/L (ref <18)

## 2023-10-06 MED ORDER — IOHEXOL 350 MG/ML SOLN
75.0000 mL | Freq: Once | INTRAVENOUS | Status: AC | PRN
  Administered 2023-10-06: 75 mL via INTRAVENOUS

## 2023-10-06 NOTE — ED Provider Notes (Signed)
 [facility] AT [facility] Provider Note   CSN: [id number] Arrival date & time: 10/05/23  2142     History  Chief Complaint  Patient presents with   Chest Pain    [name] is a 68 y.o. female.  68 yo F w/ self reported cerebral aneurysm and well controlled hypertension presents to the ED today with hypertension and left sided headache that radiates down to left chest. No neck pain. No dyspnea, nausea, extremity pain, neurologic changes otherwise. Symptoms mostly gone now but with her PMH wanted to get checked out.    Chest Pain      Home Medications Prior to Admission medications   Medication Sig Start Date End Date Taking? Authorizing Provider  aspirin EC 81 MG tablet Take 1 tablet (81 mg total) by mouth daily. 07/10/23   Barrett, Malachy Scripture, PA-C  clopidogrel (PLAVIX) 75 MG tablet Take 1 tablet (75 mg total) by mouth daily. 07/10/23 07/09/24  Barrett, Cleveland Dales R, PA-C  dapagliflozin propanediol (FARXIGA) 5 MG TABS tablet Take 1 tablet (5 mg total) by mouth daily. 09/09/23   Newlin, Enobong, MD  dicyclomine (BENTYL) 10 MG capsule Take 1 capsule (10 mg total) by mouth 3 (three) times daily before meals. 02/04/23   Asencion Blacksmith, MD  doxycycline (VIBRAMYCIN) 100 MG capsule Take 1 capsule (100 mg total) by mouth 2 (two) times daily. 08/19/23   Lind Repine, MD  DULoxetine (CYMBALTA) 30 MG capsule Take 1 capsule (30 mg total) by mouth daily. 05/02/23   Marius Siemens, NP  Evolocumab (REPATHA SURECLICK) 140 MG/ML SOAJ Inject 140 mg into the skin every 14 (fourteen) days. 09/13/23   Patel, Vaishali K, RPH  ezetimibe (ZETIA) 10 MG tablet Take 1 tablet (10 mg total) by mouth daily. 09/12/23   Nahser, Lela Purple, MD  fluconazole (DIFLUCAN) 150 MG tablet Take 1 tablet (150 mg total) by mouth daily. 08/09/23   Marius Siemens, NP  furosemide (LASIX) 20 MG tablet Take 1 tablet (20 mg total) by mouth daily as needed. 07/24/23   Dunn, Dayna N, PA-C   hydrOXYzine (ATARAX) 25 MG tablet Take 1 tablet (25 mg total) by mouth every 6 (six) hours. 08/19/23   Lind Repine, MD  losartan-hydrochlorothiazide (HYZAAR) 100-25 MG tablet Take 1 tablet by mouth daily. 05/02/23   Marius Siemens, NP  methocarbamol (ROBAXIN) 500 MG tablet Take 1 tablet (500 mg total) by mouth every 8 (eight) hours as needed for muscle spasms. 06/14/22   Wedderburn, Ngozi N, NP  methylPREDNISolone (MEDROL) 4 MG tablet To finish hospital course: 07/10/23 Take 1 tablet at supper, and 2 tabs at bedtime. on 07/11/23, take 1 tablet at breakfast, lunch, supper and bedtime (4 doses). On 1/17 take 1 tab at breakfast, lunch and bedtime. (3 doses) On 1/18, take 1 tab at breakfast and bedtime (2 doses), then on 1/19 take 1 tab at breakfast. 07/10/23   Barrett, Erin R, PA-C  metoprolol tartrate (LOPRESSOR) 100 MG tablet Take 1 tablet (100 mg total) by mouth 2 (two) times daily. 07/24/23   Dunn, Dayna N, PA-C  pantoprazole (PROTONIX) 40 MG tablet TAKE ONE TABLET (40mg  total) BY MOUTH DAILY AT 9AM Patient taking differently: Take 40 mg by mouth daily as needed (for heartburn). 06/20/22   Asencion Blacksmith, MD  potassium chloride SA (KLOR-CON M) 20 MEQ tablet Take 1 tablet (20 mEq total) by mouth daily as needed (when taking furosemide (lasix)). 07/24/23   Dunn, Dayna N, PA-C  pregabalin (LYRICA) 50 MG capsule Take 50 mg by mouth 3 (three) times daily. 04/30/22   [provider]  rosuvastatin (CRESTOR) 40 MG tablet Take 1 tablet (40 mg total) by mouth daily. 05/02/23   Marius Siemens, NP      Allergies    Patient has no known allergies.    Review of Systems   Review of Systems  Cardiovascular:  Positive for chest pain.    Physical Exam Updated Vital Signs BP (!) 146/100   Pulse 90   Temp 98.3 F (36.8 C)   Resp 17   Ht 5\' 1"  (1.549 m)   Wt 45.9 kg   SpO2 100%   BMI 19.12 kg/m  Physical Exam Vitals and nursing note reviewed.  Constitutional:      Appearance: She is  well-developed.  HENT:     Head: Normocephalic and atraumatic.  Cardiovascular:     Rate and Rhythm: Normal rate and regular rhythm.  Pulmonary:     Effort: No respiratory distress.     Breath sounds: No stridor.  Abdominal:     General: There is no distension.  Musculoskeletal:        General: Normal range of motion.     Cervical back: Normal range of motion.     Right lower leg: No edema.     Left lower leg: No edema.  Neurological:     General: No focal deficit present.     Mental Status: She is alert. She is disoriented.     ED Results / Procedures / Treatments   Labs (all labs ordered are listed, but only abnormal results are displayed) Labs Reviewed  BASIC METABOLIC PANEL WITH GFR - Abnormal; Notable for the following components:      Result Value   Potassium 3.4 (*)    Glucose, Bld 183 (*)    All other components within normal limits  RESP PANEL BY RT-PCR (RSV, FLU A&B, COVID)  [id number]  CBC  BRAIN NATRIURETIC PEPTIDE  TROPONIN I (HIGH SENSITIVITY)  TROPONIN I (HIGH SENSITIVITY)    EKG EKG Interpretation Date/Time:  Saturday October 05 2023 22:03:40 EDT Ventricular Rate:  101 PR Interval:  148 QRS Duration:  82 QT Interval:  366 QTC Calculation: 474 R Axis:   1  Text Interpretation: Sinus tachycardia Possible Left atrial enlargement ST & T wave abnormality, consider lateral ischemia Abnormal ECG When compared with ECG of 19-Aug-2023 09:33, PREVIOUS ECG IS PRESENT ST and TWI are different than febr uary but similar to the three before that, seems overall unchanged Confirmed by Eve Hinders [phone]) on 10/06/2023 12:32:42 AM  Radiology CT ANGIO HEAD NECK W WO CM Result Date: 10/06/2023 CLINICAL DATA:  Initial evaluation for acute neuro deficit, stroke suspected. EXAM: CT ANGIOGRAPHY HEAD AND NECK WITH AND WITHOUT CONTRAST TECHNIQUE: Multidetector CT imaging of the head and neck was performed using the standard protocol during bolus administration of intravenous  contrast. Multiplanar CT image reconstructions and MIPs were obtained to evaluate the vascular anatomy. Carotid stenosis measurements (when applicable) are obtained utilizing NASCET criteria, using the distal internal carotid diameter as the denominator. RADIATION DOSE REDUCTION: This exam was performed according to the departmental dose-optimization program which includes automated exposure control, adjustment of the mA and/or kV according to patient size and/or use of iterative reconstruction technique. CONTRAST:  75mL OMNIPAQUE IOHEXOL 350 MG/ML SOLN COMPARISON:  CT from earlier the same day. FINDINGS: CTA NECK FINDINGS Aortic arch: Visualized aortic arch within normal limits for caliber with  standard 3 vessel morphology. Mild aortic atherosclerosis. No significant stenosis about the origin the great vessels. Right carotid system: Right common and internal carotid arteries are patent without stenosis or dissection. Left carotid system: Left common and internal carotid arteries are patent without stenosis or dissection. Vertebral arteries: Both vertebral arteries arise from subclavian arteries. Atheromatous plaque at the origin of the right vertebral artery without hemodynamically significant stenosis. Vertebral arteries are patent without stenosis or dissection. Skeleton: No discrete or worrisome osseous lesions. Prior fusion at C6-7 with solid arthrodesis. Moderate spondylosis at C4-5 and C5-6.1 Other neck: No other acute finding. Upper chest: No other acute finding. Review of the MIP images confirms the above findings CTA HEAD FINDINGS Anterior circulation: Both internal carotid arteries are patent to the termini without stenosis. A1 segments patent bilaterally. Normal anterior communicating artery complex. Anterior cerebral arteries patent without stenosis. No M1 stenosis or occlusion. Distal MCA branches perfused and symmetric. Posterior circulation: Both V4 segments patent without stenosis. Both PICA patent.  Basilar patent without stenosis. Superior cerebellar and posterior cerebral arteries patent bilaterally. Venous sinuses: Patent allowing for timing the contrast bolus. Anatomic variants: None significant.  No aneurysm. Review of the MIP images confirms the above findings IMPRESSION: 1. Negative CTA of the head and neck. No large vessel occlusion or other emergent finding. No hemodynamically significant or correctable stenosis. 2.  Aortic Atherosclerosis (ICD10-I70.0). Electronically Signed   By: Virgia Griffins M.D.   On: 10/06/2023 01:52   CT HEAD WO CONTRAST ( ) Result Date: 10/06/2023 CLINICAL DATA:  Headache, new onset (Age >= 51y) Hx of cerebral aneurysm EXAM: CT HEAD WITHOUT CONTRAST TECHNIQUE: Contiguous axial images were obtained from the base of the skull through the vertex without intravenous contrast. RADIATION DOSE REDUCTION: This exam was performed according to the departmental dose-optimization program which includes automated exposure control, adjustment of the mA and/or kV according to patient size and/or use of iterative reconstruction technique. COMPARISON:  CT head angio 08/12/2023 FINDINGS: Brain: Patchy and confluent areas of decreased attenuation are noted throughout the deep and periventricular white matter of the cerebral hemispheres bilaterally, compatible with chronic microvascular ischemic disease. No evidence of large-territorial acute infarction. No parenchymal hemorrhage. No mass lesion. No extra-axial collection. No mass effect or midline shift. No hydrocephalus. Basilar cisterns are patent. Vascular: No hyperdense vessel. Atherosclerotic calcifications are present within the cavernous internal carotid arteries. Skull: No acute fracture or focal lesion. Sinuses/Orbits: Paranasal sinuses and mastoid air cells are clear. The orbits are unremarkable. Other: None. IMPRESSION: No acute intracranial abnormality. Electronically Signed   By: Morgane  Naveau M.D.   On: 10/06/2023  01:09   DG Chest 2 View Result Date: 10/05/2023 CLINICAL DATA:  Chest pain and headaches EXAM: CHEST - 2 VIEW COMPARISON:  08/19/2023 FINDINGS: Elevation of the left hemidiaphragm is again seen. Postsurgical changes are noted. Cardiac shadow is stable. No focal infiltrate is noted. Chronic left basilar atelectasis is seen. IMPRESSION: Chronic changes in the left base.  No acute abnormality noted. Electronically Signed   By: Violeta Grey M.D.   On: 10/05/2023 22:56    Procedures Procedures    Medications Ordered in ED Medications  iohexol (OMNIPAQUE) 350 MG/ML injection 75 mL (75 mLs Intravenous Contrast Given 10/06/23 0123)    ED Course/ Medical Decision Making/ A&P                                 Medical Decision Making Amount  and/or Complexity of Data Reviewed Radiology: ordered.  Risk Prescription drug management.   Overall patient appears well. No neurologic symptoms. Atypical story for ACS. Symptom free at [facility] spoint. Will rule out ACS with ecg/troponins and evaluate aneurysm with CTA.   Workup reassuring. Still feels well. Will continue checking bp's follow up with pcp for furthe rmanagement of same.     Final Clinical Impression(s) / ED Diagnoses Final diagnoses:  Nonspecific chest pain  Nonintractable headache, unspecified chronicity pattern, unspecified headache type  Hypertension, unspecified type    Rx / DC Orders ED Discharge Orders     None         Juniel Groene, Reymundo Caulk, MD 10/06/23 [phone]

## 2023-10-08 ENCOUNTER — Other Ambulatory Visit: Payer: Self-pay

## 2023-10-08 ENCOUNTER — Emergency Department (HOSPITAL_COMMUNITY)

## 2023-10-08 ENCOUNTER — Emergency Department (HOSPITAL_COMMUNITY)
Admission: EM | Admit: 2023-10-08 | Discharge: 2023-10-08 | Disposition: A | Discharge status: Home / Self Care | Attending: Emergency Medicine | Admitting: Emergency Medicine

## 2023-10-08 DIAGNOSIS — Z7902 Long term (current) use of antithrombotics/antiplatelets: Secondary | ICD-10-CM | POA: Diagnosis not present

## 2023-10-08 DIAGNOSIS — R079 Chest pain, unspecified: Secondary | ICD-10-CM | POA: Diagnosis not present

## 2023-10-08 DIAGNOSIS — R42 Dizziness and giddiness: Secondary | ICD-10-CM | POA: Diagnosis not present

## 2023-10-08 DIAGNOSIS — R0789 Other chest pain: Secondary | ICD-10-CM | POA: Diagnosis not present

## 2023-10-08 DIAGNOSIS — G4489 Other headache syndrome: Secondary | ICD-10-CM | POA: Diagnosis not present

## 2023-10-08 DIAGNOSIS — Z7982 Long term (current) use of aspirin: Secondary | ICD-10-CM | POA: Insufficient documentation

## 2023-10-08 DIAGNOSIS — R519 Headache, unspecified: Secondary | ICD-10-CM | POA: Insufficient documentation

## 2023-10-08 DIAGNOSIS — J9811 Atelectasis: Secondary | ICD-10-CM | POA: Diagnosis not present

## 2023-10-08 DIAGNOSIS — I499 Cardiac arrhythmia, unspecified: Secondary | ICD-10-CM | POA: Diagnosis not present

## 2023-10-08 DIAGNOSIS — R9389 Abnormal findings on diagnostic imaging of other specified body structures: Secondary | ICD-10-CM | POA: Diagnosis not present

## 2023-10-08 DIAGNOSIS — R61 Generalized hyperhidrosis: Secondary | ICD-10-CM | POA: Diagnosis not present

## 2023-10-08 LAB — CBC
HCT: 43.4 % (ref 36.0–46.0)
Hemoglobin: 13.9 g/dL (ref 12.0–15.0)
MCH: 27.2 pg (ref 26.0–34.0)
MCHC: 32 g/dL (ref 30.0–36.0)
MCV: 84.9 fL (ref 80.0–100.0)
Platelets: 362 10*3/uL (ref 150–400)
RBC: 5.11 MIL/uL (ref 3.87–5.11)
RDW: 13.1 % (ref 11.5–15.5)
WBC: 13.5 10*3/uL — ABNORMAL HIGH (ref 4.0–10.5)
nRBC: 0 % (ref 0.0–0.2)

## 2023-10-08 LAB — PROTIME-INR
INR: 1 (ref 0.8–1.2)
Prothrombin Time: 13.3 s (ref 11.4–15.2)

## 2023-10-08 LAB — BASIC METABOLIC PANEL WITH GFR
Anion gap: 12 (ref 5–15)
BUN: 21 mg/dL (ref 8–23)
CO2: 23 mmol/L (ref 22–32)
Calcium: 9 mg/dL (ref 8.9–10.3)
Chloride: 103 mmol/L (ref 98–111)
Creatinine, Ser: 1.15 mg/dL — ABNORMAL HIGH (ref 0.44–1.00)
GFR, Estimated: 52 mL/min — ABNORMAL LOW (ref >60)
Glucose, Bld: 150 mg/dL — ABNORMAL HIGH (ref 70–99)
Potassium: 3.6 mmol/L (ref 3.5–5.1)
Sodium: 138 mmol/L (ref 135–145)

## 2023-10-08 LAB — TROPONIN I (HIGH SENSITIVITY)
Troponin I (High Sensitivity): 9 ng/L (ref <18)
Troponin I (High Sensitivity): 13 ng/L (ref <18)

## 2023-10-08 MED ORDER — ALUM & MAG HYDROXIDE-SIMETH 200-200-20 MG/5ML PO SUSP
30.0000 mL | Freq: Once | ORAL | Status: AC
  Administered 2023-10-08: 30 mL via ORAL
  Filled 2023-10-08: qty 30

## 2023-10-08 NOTE — ED Provider Triage Note (Addendum)
 Emergency Medicine Provider Triage Evaluation Note  [name] , a 68 y.o. female  was evaluated in triage.  Pt complains of chest pain.  Patient was with a friend and had substernal chest pain and some diaphoresis.  Patient was recently seen here 2 days ago and had a negative workup including a negative CTA.  Review of Systems  Positive: Chest pain and diaphoresis Negative: Fever  Physical Exam  BP 106/81 (BP Location: Right Arm)   Pulse 93   Temp 98.7 F (37.1 C) (Oral)   Resp 18   Wt 46 kg   SpO2 100%   BMI 19.16 kg/m  Gen:   Awake, no distress   Resp:  Normal effort  MSK:   Moves extremities without difficulty  Other:    Medical Decision Making  Medically screening exam initiated at 7:21 PM.  Appropriate orders placed.  [name] was informed that the remainder of the evaluation will be completed by another provider, this initial triage assessment does not replace that evaluation, and the importance of remaining in the ED until their evaluation is complete.  [name] is a 68 y.o. female here presenting with chest pain.  Vitals are stable.  Will get troponin x 2.  Recent CTA unremarkable.  Will also give GI cocktail.  Stable for waiting room     Dalene Duck, MD 10/08/23 Karry Paganini, MD 10/08/23 Larita Pluck

## 2023-10-08 NOTE — ED Notes (Signed)
 Pt denies chest pain at this time. Respirations regular, unlabored. GCS 15.

## 2023-10-08 NOTE — ED Provider Notes (Signed)
 [place] EMERGENCY DEPARTMENT AT [facility] Provider Note   CSN: [id number] Arrival date & time: 10/08/23  1547     History  Chief Complaint  Patient presents with   Chest Pain    [name] is a 68 y.o. female here presenting with chest pain.  Patient was seen here 2 days ago for chest pain.  Patient states that [name] was going shopping today and had sudden onset of chest pain.  Patient also felt lightheaded and dizzy as well.  Patient was seen here 2 days ago and had a negative CTA head and neck since [name] had some headaches.  The history is provided by the patient.       Home Medications Prior to Admission medications   Medication Sig Start Date End Date Taking? Authorizing Provider  aspirin EC 81 MG tablet Take 1 tablet (81 mg total) by mouth daily. 07/10/23   Barrett, Rae Roam, PA-C  clopidogrel (PLAVIX) 75 MG tablet Take 1 tablet (75 mg total) by mouth daily. 07/10/23 07/09/24  Barrett, Denny Peon R, PA-C  dapagliflozin propanediol (FARXIGA) 5 MG TABS tablet Take 1 tablet (5 mg total) by mouth daily. 09/09/23   Hoy Register, MD  dicyclomine (BENTYL) 10 MG capsule Take 1 capsule (10 mg total) by mouth 3 (three) times daily before meals. 02/04/23   Meryl Dare, MD  doxycycline (VIBRAMYCIN) 100 MG capsule Take 1 capsule (100 mg total) by mouth 2 (two) times daily. 08/19/23   Lorre Nick, MD  DULoxetine (CYMBALTA) 30 MG capsule Take 1 capsule (30 mg total) by mouth daily. 05/02/23   [name], NP  Evolocumab (REPATHA SURECLICK) 140 MG/ML SOAJ Inject 140 mg into the skin every 14 (fourteen) days. 09/13/23   Tylene Fantasia, RPH  ezetimibe (ZETIA) 10 MG tablet Take 1 tablet (10 mg total) by mouth daily. 09/12/23   Nahser, Deloris Ping, MD  fluconazole (DIFLUCAN) 150 MG tablet Take 1 tablet (150 mg total) by mouth daily. 08/09/23   [name], NP  furosemide (LASIX) 20 MG tablet Take 1 tablet (20 mg total) by mouth daily as needed. 07/24/23   Dunn, Tacey Ruiz,  PA-C  hydrOXYzine (ATARAX) 25 MG tablet Take 1 tablet (25 mg total) by mouth every 6 (six) hours. 08/19/23   Lorre Nick, MD  losartan-hydrochlorothiazide (HYZAAR) 100-25 MG tablet Take 1 tablet by mouth daily. 05/02/23   [name], NP  methocarbamol (ROBAXIN) 500 MG tablet Take 1 tablet (500 mg total) by mouth every 8 (eight) hours as needed for muscle spasms. 06/14/22   Debby Freiberg, NP  methylPREDNISolone (MEDROL) 4 MG tablet To finish hospital course: 07/10/23 Take 1 tablet at supper, and 2 tabs at bedtime. on 07/11/23, take 1 tablet at breakfast, lunch, supper and bedtime (4 doses). On 1/17 take 1 tab at breakfast, lunch and bedtime. (3 doses) On 1/18, take 1 tab at breakfast and bedtime (2 doses), then on 1/19 take 1 tab at breakfast. 07/10/23   Barrett, Erin R, PA-C  metoprolol tartrate (LOPRESSOR) 100 MG tablet Take 1 tablet (100 mg total) by mouth 2 (two) times daily. 07/24/23   Dunn, Tacey Ruiz, PA-C  pantoprazole (PROTONIX) 40 MG tablet TAKE ONE TABLET (40mg  total) BY MOUTH DAILY AT 9AM Patient taking differently: Take 40 mg by mouth daily as needed (for heartburn). 06/20/22   Meryl Dare, MD  potassium chloride SA (KLOR-CON M) 20 MEQ tablet Take 1 tablet (20 mEq total) by mouth daily as  needed (when taking furosemide (lasix)). 07/24/23   Dunn, Tacey Ruiz, PA-C  pregabalin (LYRICA) 50 MG capsule Take 50 mg by mouth 3 (three) times daily. 04/30/22   [provider]  rosuvastatin (CRESTOR) 40 MG tablet Take 1 tablet (40 mg total) by mouth daily. 05/02/23   [name], NP      Allergies    Patient has no known allergies.    Review of Systems   Review of Systems  Cardiovascular:  Positive for chest pain.  All other systems reviewed and are negative.   Physical Exam Updated Vital Signs BP (!) 136/92 (BP Location: Right Arm)   Pulse 93   Temp 98.5 F (36.9 C) (Oral)   Resp 17   Wt 46 kg   SpO2 100%   BMI 19.16 kg/m  Physical Exam Vitals and nursing  note reviewed.  Constitutional:      Appearance: [name] is well-developed.  HENT:     Head: Normocephalic.  Eyes:     Extraocular Movements: Extraocular movements intact.     Pupils: Pupils are equal, round, and reactive to light.  Cardiovascular:     Rate and Rhythm: Normal rate and regular rhythm.     Heart sounds: Normal heart sounds.  Pulmonary:     Effort: Pulmonary effort is normal.     Breath sounds: Normal breath sounds.  Abdominal:     General: Bowel sounds are normal.     Palpations: Abdomen is soft.  Musculoskeletal:        General: Normal range of motion.     Cervical back: Normal range of motion and neck supple.  Skin:    General: Skin is warm.     Capillary Refill: Capillary refill takes less than 2 seconds.  Neurological:     General: No focal deficit present.     Mental Status: [name] is alert and oriented to person, place, and time.  Psychiatric:        Mood and Affect: Mood normal.        Behavior: Behavior normal.     ED Results / Procedures / Treatments   Labs (all labs ordered are listed, but only abnormal results are displayed) Labs Reviewed  BASIC METABOLIC PANEL WITH GFR - Abnormal; Notable for the following components:      Result Value   Glucose, Bld 150 (*)    Creatinine, Ser 1.15 (*)    GFR, Estimated 52 (*)    All other components within normal limits  CBC - Abnormal; Notable for the following components:   WBC 13.5 (*)    All other components within normal limits  PROTIME-INR  TROPONIN I (HIGH SENSITIVITY)  TROPONIN I (HIGH SENSITIVITY)    EKG None  Radiology DG Chest 2 View Result Date: 10/08/2023 CLINICAL DATA:  Chest pain and dizziness EXAM: CHEST - 2 VIEW COMPARISON:  10/05/2023 FINDINGS: Cardiac shadow is stable. Postsurgical changes are again seen. Right lung is clear. Elevation of left hemidiaphragm is noted with left basilar atelectasis stable in appearance from the prior exam. No acute abnormality is noted. IMPRESSION: No change  from the prior study. Electronically Signed   By: Alcide Clever M.D.   On: 10/08/2023 19:39    Procedures Procedures    Medications Ordered in ED Medications  alum & mag hydroxide-simeth (MAALOX/MYLANTA) 200-200-20 MG/5ML suspension 30 mL (30 mLs Oral Given 10/08/23 1959)    ED Course/ Medical Decision Making/ A&P  Medical Decision Making AVIAN GREENAWALT is a 68 y.o. female here presenting with chest pain.  Patient had chest pain several days ago and had negative workup.  Patient was out shopping and had another episode of chest pain.  Patient is pain-free right now.  Will get troponin x 2 and chest x-ray.  I think likely GI in origin so we will try GI cocktail  9:38 PM Troponin negative x 2.  Chemistry unremarkable.  Patient felt completely better after GI cocktail.  Likely mild gastritis we will start patient on PPI and [name] has cardiology follow-up already.  Recommend GI follow-up as well.  Problems Addressed: Chest pain, unspecified type: acute illness or injury  Amount and/or Complexity of Data Reviewed Labs: ordered. Decision-making details documented in ED Course. Radiology: ordered and independent interpretation performed. Decision-making details documented in ED Course. ECG/medicine tests: ordered. Decision-making details documented in ED Course.  Risk OTC drugs.    Final Clinical Impression(s) / ED Diagnoses Final diagnoses:  None    Rx / DC Orders ED Discharge Orders     None         Dalene Duck, MD 10/08/23 2138

## 2023-10-08 NOTE — ED Triage Notes (Signed)
 PT BIB EMS for complaints of chest pain, dizziness, near syncope headache and nausea. Pt received 150cc ns given through 20g LAC iv. HA 3/10  120/73 89 pulse  97% RA 162 cbg  16RR

## 2023-10-08 NOTE — ED Notes (Signed)
 Second troponin recollected and resent to main lab.

## 2023-10-08 NOTE — Discharge Instructions (Addendum)
 As we discussed, your heart enzyme are normal today  I recommend you take Nexium 20 mg daily  I also recommend that if you have recurrent pain you can take some Maalox  I recommend that you follow-up with Dr. Alroy Aspen from cardiology and Dr. Sandrea Cruel from GI  Return to ER if you have worse chest pain or shortness of breath or abdominal pain

## 2023-10-10 ENCOUNTER — Other Ambulatory Visit (HOSPITAL_COMMUNITY): Payer: Self-pay

## 2023-10-14 ENCOUNTER — Ambulatory Visit (INDEPENDENT_AMBULATORY_CARE_PROVIDER_SITE_OTHER): Payer: Medicare Other | Admitting: Podiatry

## 2023-10-14 DIAGNOSIS — Z91199 Patient's noncompliance with other medical treatment and regimen due to unspecified reason: Secondary | ICD-10-CM

## 2023-10-14 NOTE — Progress Notes (Signed)
 No show

## 2023-10-17 ENCOUNTER — Ambulatory Visit: Payer: PPO | Admitting: Physician Assistant

## 2023-10-18 NOTE — Progress Notes (Deleted)
 Cardiology Office Note:  .   Date:  10/18/2023  ID:  AANYA HAYNES, DOB August 22, 1955, MRN [id number] PCP: Marius Siemens, NP  [facility] Providers Cardiologist:  Ahmad Alert, MD { Click to update primary MD,subspecialty MD or APP then REFRESH:1}   Patient Profile: .      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 Avanell Bob in 2016.  She was then previously seen by PCV (Dr. Braxton Calico) for diagnosis of resistant hypertension.  Renal artery duplex 10/2021 showed no renal artery stenosis + simple right kidney cyst.  More recently she was admitted with chest pain, mildly elevated troponin, and hypertensive urgency in the setting of being without BP meds for 3 months.  She was found to have a EF 40 to 45% and underwent cath which revealed multivessel CAD.  She underwent CABG x 4 on 07/03/2023.  Full echo had otherwise shown G1 DD, severe LAE. Intra-op TEE showed EF 45 to 50%.  Postop course notable for ABL anemia and left pleural effusion requiring thoracentesis attempt though not enough fluid to tap.  She was recommended for DAPT given elevated enzymes.  Seen by Alice Innocent, PA on 07/24/2023 for hospital follow-up.  She reported her mother had passed away a few months prior and originally attributed anginal symptoms to stress but is glad she went to the hospital.  She was feeling overall better with no chest pain or shortness of breath.  She was having trouble sleeping but denied orthopnea.  Initial BP 142/88 with recheck of 130/74.  She reports home readings are similar to initial BP in office.  History of brain aneurysm followed by PCP.  She was felt to have possible residual L pleural effusion.  Lasix  20 mg daily and potassium were prescribed for 3 days and then as needed in the event that if postop CXR shows  continued effusion, not recommended for long term use given that she is also on losartan -hydrochlorothiazide  and Farxiga .  Metoprolol  was increased to 100 mg twice daily with advisement to follow BP for 1 week at least 3 hours after medication.  Lipid panel revealed LDL-C 157, triglycerides 193, total cholesterol 251.  She was referred to Pharm.[facility]. lipid clinic for management of elevated cholesterol.  Recommendations regarding DAPT with history of aneurysm were reviewed by PA.  She was advised to have CT angio head and neck for monitoring of aneurysm and was referred to Dr. Alvira Josephs with IR.   CT 08/15/23 revealed stable aneurysm, 3 mm left carotid siphon aneurysm.   She went to ED 08/19/23 for atypical chest pain with EKG in NSR, negative troponins. Hydroxyzine  was prescribed for anxiety. She was advised to start Repatha    Additional ED visits 08/19/23, 10/06/23, and 10/08/23.        History of Present Illness: .   Donta ELVENA OYER is a *** 68 y.o. female ***   Discussed the use of AI scribe software for clinical note transcription with the patient, who gave verbal consent to proceed.   ROS: ***       Studies Reviewed: Aaron Aas         Lipoprotein (a)  Date/Time Value Ref Range Status  06/25/2023 12:02 PM <8.4 <75.0 nmol/L Final    Comment:    (NOTE) **Results verified by repeat testing** Note:  Values greater than or equal to  75.0 nmol/L may       indicate an independent risk factor for CHD,       but must be evaluated with caution when applied       to non-Caucasian populations due to the       influence of genetic factors on Lp(a) across       ethnicities. Performed At: [facility] [address] [place], [place] [id number] Pearlean Botts MD JX:[id number]      *** Risk Assessment/Calculations:   {Does this patient have ATRIAL FIBRILLATION?:[phone]} No BP recorded.  {Refresh Note OR Click here to enter BP  :1}***       Physical Exam:   VS:  There were no vitals taken  for this visit.   Wt Readings from Last 3 Encounters:  10/08/23 101 lb 6.6 oz (46 kg)  10/05/23 101 lb 3.1 oz (45.9 kg)  08/19/23 101 lb 3.1 oz (45.9 kg)    GEN: Well nourished, well developed in no acute distress NECK: No JVD; No carotid bruits CARDIAC: ***RRR, no murmurs, rubs, gallops RESPIRATORY:  Clear to auscultation without rales, wheezing or rhonchi  ABDOMEN: Soft, non-tender, non-distended EXTREMITIES:  No edema; No deformity     ASSESSMENT AND PLAN: .   ***    {Are you ordering a CV Procedure (e.g. stress test, cath, DCCV, TEE, etc)?   Press F2        :[id number]}  Disposition:***  Signed, Slater Duncan, NP-C

## 2023-10-21 ENCOUNTER — Ambulatory Visit (HOSPITAL_BASED_OUTPATIENT_CLINIC_OR_DEPARTMENT_OTHER): Admitting: Nurse Practitioner

## 2023-10-29 ENCOUNTER — Ambulatory Visit: Admitting: Nurse Practitioner

## 2023-10-31 NOTE — Progress Notes (Addendum)
 Cardiology Office Note    Patient Name: [name] Date of Encounter: 11/01/2023  Primary Care Provider:  Marius Siemens, NP Primary Cardiologist:  Ahmad Alert, MD Primary Electrophysiologist: None   Past Medical History    Past Medical History:  Diagnosis Date   Anemia    hx   Aneurysm (HCC)    small, on left side of brain    Anxiety    hx of   Arthritis    generalized   CAD (coronary artery disease)    Diabetes mellitus without complication (HCC)    diet control, pt denies- on medications   GERD (gastroesophageal reflux disease)    iwht certain foods/on meds/OTC PRN meds   Heart failure with mildly reduced ejection fraction (HFmrEF) (HCC)    Hyperlipidemia    on meds   Hypertension    on meds   TIA (transient ischemic attack)    pt unaware of this hx on 06/19/2017    History of Present Illness  [name] is a 68 year old female PMH of CAD s/p CABG x 4 06/2023, HFrEF, HTN, HLD, DM type II, TIA, 2018, cerebral aneurysm, anemia, GERD  She remotely saw Dr. Avanell Bob in 2016.  She was then previously seen by PCV (Dr. Braxton Calico) for diagnosis of resistant hypertension.  Renal artery duplex 10/2021 showed no renal artery stenosis + simple right kidney cyst.  More recently she was admitted with chest pain, mildly elevated troponin, and hypertensive urgency in the setting of being without BP meds for 3 months.  She was found to have a EF 40 to 45% and underwent cath which revealed multivessel CAD.  She underwent CABG x 4 on 07/03/2023.  Full echo had otherwise shown G1 DD, severe LAE. Intra-op TEE showed EF 45 to 50%.  Postop course notable for ABL anemia and left pleural effusion requiring thoracentesis attempt though not enough fluid to tap.  She was recommended for DAPT given elevated enzymes.  She was seen by Alice Innocent, PA on 07/24/2023 for hospital follow-up.  She reported her mother had passed away a few months prior and originally attributed anginal symptoms to  stress but is glad she went to the hospital.  She was feeling overall better with no chest pain or shortness of breath.  She was having trouble sleeping but denied orthopnea.  Initial BP 142/88 with recheck of 130/74.  She reports home readings are similar to initial BP in office.  History of brain aneurysm followed by PCP.  She was felt to have possible residual L pleural effusion.  Lasix  20 mg daily and potassium were prescribed for 3 days and then as needed in the event that if postop CXR shows continued effusion, not recommended for long term use given that she is also on losartan -hydrochlorothiazide  and Farxiga .  Metoprolol  was increased to 100 mg twice daily with advisement to follow BP for 1 week at least 3 hours after medication.  Lipid panel revealed LDL-C 157, triglycerides 193, total cholesterol 251.  She was referred to Pharm.D. lipid clinic for management of elevated cholesterol.  Recommendations regarding DAPT with history of aneurysm were reviewed by PA.  She was advised to have CT angio head and neck for monitoring of aneurysm and was referred to Dr. Alvira Josephs with IR.  CT 08/15/23 revealed stable aneurysm, 3 mm left carotid siphon aneurysm.   She went to ED 08/19/23 for atypical chest pain with EKG in NSR, negative troponins. Hydroxyzine  was prescribed for anxiety. She was advised to  start Repatha .  She also was seen in the ED on 08/19/2023 as well as 10/06/2023 with complaint of chest pain that occurred while shopping.  She underwent workup and ACS was ruled out.  She was seen most recently on 10/08/23 with complaint of chest pain and troponins were negative x 2 with patient feeling improvement after GI cocktail.  [name] presents today with her daughter for post ED follow-up.She experiences significant fatigue, particularly in the mornings, following her recent open heart surgery. She describes feeling 'down' and having low energy levels, which makes it difficult to start her day. She has not  yet started cardiac rehabilitation and is awaiting clearance from her cardiac surgeon. She has a history of elevated blood pressure, with home readings as high as 134/105 mmHg.  Her blood pressure today was 128/90 and was 122/96 on recheck.  She is currently taking metoprolol  100 mg, which she believes may be contributing to her fatigue. Her current medications include Repatha  and metoprolol , taken first thing in the morning. She struggles with maintaining adequate hydration, drinking only a couple of bottles of water a day. She has a history of anxiety, particularly following the loss of her mother and brother, which has affected her sleep. She reports difficulty sleeping and has been a 'time sleeper' in recent nights. She has been on anxiety medication in the past to help with sleep issues. No current chest pain or shortness of breath. Patient denies chest pain, palpitations, dyspnea, PND, orthopnea, nausea, vomiting, dizziness, syncope, edema, weight gain, or early satiety.  Discussed the use of AI scribe software for clinical note transcription with the patient, who gave verbal consent to proceed.  History of Present Illness   Review of Systems  Please see the history of present illness.    All other systems reviewed and are otherwise negative except as noted above.  Physical Exam     Wt Readings from Last 3 Encounters:  11/01/23 107 lb (48.5 kg)  10/08/23 101 lb 6.6 oz (46 kg)  10/05/23 101 lb 3.1 oz (45.9 kg)   VS: Vitals:   11/01/23 1538 11/01/23 1557  BP: (!) 128/90 (!) 122/96  Pulse: (!) 112   SpO2: 98%   ,Body mass index is 20.22 kg/m. GEN: Well nourished, well developed in no acute distress Neck: No JVD; No carotid bruits Pulmonary: Clear to auscultation without rales, wheezing or rhonchi  Cardiovascular: Normal rate. Regular rhythm. Normal S1. Normal S2.   Murmurs: There is no murmur.  ABDOMEN: Soft, non-tender, non-distended EXTREMITIES:  No edema; No deformity    EKG/LABS/ Recent Cardiac Studies   ECG personally reviewed by me today -none completed today  Risk Assessment/Calculations:          Lab Results  Component Value Date   WBC 13.5 (H) 10/08/2023   HGB 13.9 10/08/2023   HCT 43.4 10/08/2023   MCV 84.9 10/08/2023   PLT 362 10/08/2023   Lab Results  Component Value Date   CREATININE 1.15 (H) 10/08/2023   BUN 21 10/08/2023   NA 138 10/08/2023   K 3.6 10/08/2023   CL 103 10/08/2023   CO2 23 10/08/2023   Lab Results  Component Value Date   CHOL 251 (H) 07/25/2023   HDL 59 07/25/2023   LDLCALC 157 (H) 07/25/2023   TRIG 193 (H) 07/25/2023   CHOLHDL 4.3 07/25/2023    Lab Results  Component Value Date   HGBA1C 6.2 (H) 07/03/2023   Assessment & Plan    1.  History of CAD: -s/p CABG x 4 on 06/2023 with no complaints of chest pain during today's visit. - He reports ongoing fatigue with no medication compliance concerns. - She is interested in performing cardiac rehab and referral note will be sent to rehab coordinator. - Continue current GDMT with ASA 81 mg, Plavix  75 mg, Repatha  140 mg q. 14 days, ezetimibe  10 mg, metoprolol  100 mg in the morning and 150 mg in the evening and Crestor  40 mg daily  2.HFmrEF: - 2D echo completed with EF of 45-50% and no RWMA with mild concentric LVH and dilated LA with no evidence of pericardial effusion. - Patient is euvolemic on examination today - Continue Farxiga  5 mg daily, Lasix  20 mg as needed, losartan  100-25 mg daily and metoprolol  100 mg in the a.m. and 150 mg in the p.m. - We will plan to start spironolactone  25 mg if blood pressure remains elevated at follow-up. - We will check CBC and BMP today  3.  Essential hypertension: -HYPERTENSION CONTROL Vitals:   11/01/23 1538 11/01/23 1557  BP: (!) 128/90 (!) 122/96    The patient's blood pressure is elevated above target today.  In order to address the patient's elevated BP: Blood pressure will be monitored at home to determine if  medication changes need to be made.; A current anti-hypertensive medication was adjusted today.; A referral to the [facility] will be placed.; Follow up with general cardiology has been recommended.    -Continue Hyzaar  100-25 mg daily, metoprolol  100 mg in the a.m. and 150 mg in the p.m. and amlodipine  5 mg daily - Patient will monitor blood pressures over the next 2 weeks and contact office with readings with plan to refer to hypertension clinic if BP remains above goal of 130/80  4.  Sinus tachycardia: Heart rate elevated, 90s to 106 bpm. Potential factors: anxiety, suboptimal medication. Current metoprolol  may be insufficient. - Consider increasing evening dose of metoprolol  for heart rate control and sleep improvement. - Monitor heart rate and adjust medications as needed. - Check CBC to rule out possible anemia  5.  Hyperlipidemia: -Patient's LDL was 157 -Repatha  140 mg q. 14 days, ezetimibe  10 mg,Crestor  40 mg daily - We will recheck fasting LFT and lipids at follow-up.  Disposition: Follow-up with Ahmad Alert, MD or APP in 3 months   Signed, Francene Ing, Retha Cast, NP 11/01/2023, 5:41 PM [facility]

## 2023-11-01 ENCOUNTER — Encounter: Payer: Self-pay | Admitting: Nurse Practitioner

## 2023-11-01 ENCOUNTER — Ambulatory Visit: Attending: Nurse Practitioner | Admitting: Nurse Practitioner

## 2023-11-01 VITALS — BP 122/96 | HR 112 | Ht 61.0 in | Wt 107.0 lb

## 2023-11-01 DIAGNOSIS — E785 Hyperlipidemia, unspecified: Secondary | ICD-10-CM

## 2023-11-01 DIAGNOSIS — Z760 Encounter for issue of repeat prescription: Secondary | ICD-10-CM

## 2023-11-01 DIAGNOSIS — E119 Type 2 diabetes mellitus without complications: Secondary | ICD-10-CM

## 2023-11-01 DIAGNOSIS — I2510 Atherosclerotic heart disease of native coronary artery without angina pectoris: Secondary | ICD-10-CM | POA: Diagnosis not present

## 2023-11-01 DIAGNOSIS — I10 Essential (primary) hypertension: Secondary | ICD-10-CM

## 2023-11-01 DIAGNOSIS — R000 Tachycardia, unspecified: Secondary | ICD-10-CM | POA: Diagnosis not present

## 2023-11-01 DIAGNOSIS — E782 Mixed hyperlipidemia: Secondary | ICD-10-CM | POA: Diagnosis not present

## 2023-11-01 DIAGNOSIS — I5022 Chronic systolic (congestive) heart failure: Secondary | ICD-10-CM

## 2023-11-01 MED ORDER — AMLODIPINE BESYLATE 5 MG PO TABS
5.0000 mg | ORAL_TABLET | Freq: Every day | ORAL | 1 refills | Status: DC
Start: 2023-11-01 — End: 2023-12-05

## 2023-11-01 MED ORDER — CLOPIDOGREL BISULFATE 75 MG PO TABS: 75.0000 mg | ORAL_TABLET | Freq: Every day | ORAL | 3 refills | Status: DC

## 2023-11-01 MED ORDER — EZETIMIBE 10 MG PO TABS: 10.0000 mg | ORAL_TABLET | Freq: Every day | ORAL | 3 refills | Status: DC

## 2023-11-01 MED ORDER — ROSUVASTATIN CALCIUM 40 MG PO TABS: 40.0000 mg | ORAL_TABLET | Freq: Every day | ORAL | 3 refills | Status: AC

## 2023-11-01 MED ORDER — POTASSIUM CHLORIDE CRYS ER 20 MEQ PO TBCR: 20.0000 meq | EXTENDED_RELEASE_TABLET | Freq: Every day | ORAL | 3 refills | Status: AC | PRN

## 2023-11-01 MED ORDER — FUROSEMIDE 20 MG PO TABS: 20.0000 mg | ORAL_TABLET | Freq: Every day | ORAL | 3 refills | Status: AC | PRN

## 2023-11-01 MED ORDER — METOPROLOL TARTRATE 100 MG PO TABS: ORAL_TABLET | ORAL | 1 refills | Status: DC

## 2023-11-01 MED ORDER — LOSARTAN POTASSIUM-HCTZ 100-25 MG PO TABS: 1.0000 | ORAL_TABLET | Freq: Every day | ORAL | 3 refills | Status: DC

## 2023-11-01 NOTE — Patient Instructions (Addendum)
 Medication Instructions:  INCREASE Metoprolol  to 100mg  in the am and 150mg  in the pm START Norvasc  5mg  Take 1 tablet once a day   CHECK YOUR BLOOD PRESSURE DAILY AND CONTACT TH E OFFICE NEXT WITH YOUR BLOOD PRESSURE AND PULSE (HEART RATE) READINGS *If you need a refill on your cardiac medications before your next appointment, please call your pharmacy*  Lab Work: TODAY-BMET, BNP & CBC If you have labs (blood work) drawn today and your tests are completely normal, you will receive your results only by: MyChart Message (if you have MyChart) OR A paper copy in the mail If you have any lab test that is abnormal or we need to change your treatment, we will call you to review the results.  Testing/Procedures: NONE ORDERED  Follow-Up: At [facility], you and your health needs are our priority.  As part of our continuing mission to provide you with exceptional heart care, our providers are all part of one team.  This team includes your primary Cardiologist (physician) and Advanced Practice Providers or APPs (Physician Assistants and Nurse Practitioners) who all work together to provide you with the care you need, when you need it.  Your next appointment:   3 month(s)  Provider:   Charles Connor, NP or Graciela Lava, PA-C        We recommend signing up for the patient portal called "MyChart".  Sign up information is provided on this After Visit Summary.  MyChart is used to connect with patients for Virtual Visits (Telemedicine).  Patients are able to view lab/test results, encounter notes, upcoming appointments, etc.  Non-urgent messages can be sent to your provider as well.   To learn more about what you can do with MyChart, go to [email or web address].   Other Instructions Check your blood pressure daily for 2 weeks, then contact the office with your readings.  Contact the office either by phone or MyChart with your readings.  Make sure to check your blood pressure 2 hours after taking  your medications.   AVOID these things for 30 minutes before checking your blood pressure: No Drinking caffeine. No Drinking alcohol. No Eating. No Smoking. No Exercising.  Five minutes before checking your blood pressure: Pee. Sit in a dining chair. Avoid sitting in a soft couch or armchair. Be quiet. Do not talk.

## 2023-11-02 LAB — BASIC METABOLIC PANEL WITH GFR
BUN/Creatinine Ratio: 25 (ref 12–28)
BUN: 20 mg/dL (ref 8–27)
CO2: 24 mmol/L (ref 20–29)
Calcium: 9.9 mg/dL (ref 8.7–10.3)
Chloride: 102 mmol/L (ref 96–106)
Creatinine, Ser: 0.79 mg/dL (ref 0.57–1.00)
Glucose: 90 mg/dL (ref 70–99)
Potassium: 4.2 mmol/L (ref 3.5–5.2)
Sodium: 143 mmol/L (ref 134–144)
eGFR: 81 mL/min/{1.73_m2} (ref >59)

## 2023-11-02 LAB — CBC
Hematocrit: 40.3 % (ref 34.0–46.6)
Hemoglobin: 13.3 g/dL (ref 11.1–15.9)
MCH: 27.5 pg (ref 26.6–33.0)
MCHC: 33 g/dL (ref 31.5–35.7)
MCV: 83 fL (ref 79–97)
Platelets: 406 10*3/uL (ref 150–450)
RBC: 4.83 x10E6/uL (ref 3.77–5.28)
RDW: 13.1 % (ref 11.7–15.4)
WBC: 11.4 10*3/uL — ABNORMAL HIGH (ref 3.4–10.8)

## 2023-11-02 LAB — PRO B NATRIURETIC PEPTIDE: NT-Pro BNP: 869 pg/mL — ABNORMAL HIGH (ref 0–301)

## 2023-11-05 ENCOUNTER — Ambulatory Visit: Payer: Self-pay

## 2023-11-06 ENCOUNTER — Other Ambulatory Visit (HOSPITAL_BASED_OUTPATIENT_CLINIC_OR_DEPARTMENT_OTHER): Admitting: Pharmacist

## 2023-11-06 DIAGNOSIS — E119 Type 2 diabetes mellitus without complications: Secondary | ICD-10-CM

## 2023-11-06 DIAGNOSIS — Z7984 Long term (current) use of oral hypoglycemic drugs: Secondary | ICD-10-CM

## 2023-11-06 NOTE — Progress Notes (Signed)
 Patient appeared on insurance report for not passing the quality metrics in 2024: Medication Adherence for Diabetes (MAD)   Outreach to the patient was Successful. She continues to struggle with adherence to Farxiga .  I sent a rxn in March for a 90-ds of Farxiga . I confirmed today that she has not picked this up but is going to the pharmacy today. She plans to pick it up. Reminder set for next week to check on this again.   Marene Shape, PharmD, Becky Bowels, CPP Clinical Pharmacist [facility] [phone]

## 2023-11-07 ENCOUNTER — Other Ambulatory Visit (INDEPENDENT_AMBULATORY_CARE_PROVIDER_SITE_OTHER): Payer: Self-pay | Admitting: Primary Care

## 2023-11-07 ENCOUNTER — Telehealth (INDEPENDENT_AMBULATORY_CARE_PROVIDER_SITE_OTHER): Payer: Self-pay | Admitting: Primary Care

## 2023-11-07 NOTE — Telephone Encounter (Signed)
 Called pt to confirm appt. LVM for pt with appt details.

## 2023-11-08 ENCOUNTER — Encounter (INDEPENDENT_AMBULATORY_CARE_PROVIDER_SITE_OTHER): Payer: Self-pay | Admitting: Primary Care

## 2023-11-08 ENCOUNTER — Ambulatory Visit (INDEPENDENT_AMBULATORY_CARE_PROVIDER_SITE_OTHER): Payer: PPO | Admitting: Primary Care

## 2023-11-08 ENCOUNTER — Telehealth: Payer: Self-pay

## 2023-11-08 VITALS — BP 152/97 | HR 104 | Resp 16 | Ht 61.0 in | Wt 107.0 lb

## 2023-11-08 DIAGNOSIS — Z7984 Long term (current) use of oral hypoglycemic drugs: Secondary | ICD-10-CM | POA: Diagnosis not present

## 2023-11-08 DIAGNOSIS — E119 Type 2 diabetes mellitus without complications: Secondary | ICD-10-CM

## 2023-11-08 DIAGNOSIS — I10 Essential (primary) hypertension: Secondary | ICD-10-CM | POA: Diagnosis not present

## 2023-11-08 DIAGNOSIS — E782 Mixed hyperlipidemia: Secondary | ICD-10-CM | POA: Diagnosis not present

## 2023-11-08 LAB — POCT GLYCOSYLATED HEMOGLOBIN (HGB A1C): HbA1c, POC (controlled diabetic range): 7.3 % — AB (ref 0.0–7.0)

## 2023-11-08 MED ORDER — DAPAGLIFLOZIN PROPANEDIOL 10 MG PO TABS: 10.0000 mg | ORAL_TABLET | Freq: Every day | ORAL | 1 refills | Status: DC

## 2023-11-08 NOTE — Telephone Encounter (Signed)
 Unable to refill per protocol, Rx expired. Discontinued 08/16/22  Requested Prescriptions  Pending Prescriptions Disp Refills   metoprolol  succinate (TOPROL -XL) 25 MG 24 hr tablet [Pharmacy Med Name: METOPROLOL  SUCC ER 25 MG TAB] 90 tablet 1    Sig: TAKE 1 TABLET (25 MG TOTAL) BY MOUTH DAILY.     Cardiovascular:  Beta Blockers Failed - 11/08/2023  1:59 PM      Failed - Last BP in normal range    BP Readings from Last 1 Encounters:  11/08/23 (!) 152/97         Passed - Last Heart Rate in normal range    Pulse Readings from Last 1 Encounters:  11/08/23 (!) 104         Passed - Valid encounter within last 6 months    Recent Outpatient Visits           Today Diabetes mellitus type 2 in nonobese [facility])   [place] [facility] Marius Siemens, NP   3 months ago Vaginal itching   [place] [facility] Marius Siemens, NP   6 months ago Diabetes mellitus type 2 in nonobese [facility])   [place] [facility] Marius Siemens, NP   11 months ago Hospital discharge follow-up   [place] [facility] Marius Siemens, NP   1 year ago Hospital discharge follow-up   [place] [facility] Marius Siemens, NP       Future Appointments             In 3 months Valentina Gasman, Karon Packer., NP [facility] Dept of [employer] [employer], [employer]

## 2023-11-08 NOTE — Patient Instructions (Signed)
 Please Call [facility]  to schedule Mammogram at [phone]

## 2023-11-08 NOTE — Telephone Encounter (Signed)
 Called the patient who states she did get my message. She states it is always safe to leave her a message because sometimes her phone does not ring.   Patient states she has not started her medication because she did not have the funds to pay for her medication. She states she is on a fixed income, and her sister gave her some money today to get her medication. She states she will go pick her medication today and get started it on it. She states she was checking her blood pressure at home but her sister spilled drink on it and she had to throw away the log. She states she will start recording her readings on a blank piece of paper and call back in a week with the readings. She states her blood pressure was elevated when she went to see her pcp.   I advised patient that I would speak with Renelda Carry and get back to her if he had any additional recommendations. She voiced understanding.

## 2023-11-08 NOTE — Progress Notes (Signed)
 [facility]  [name], is a 68 y.o. female  [id number]  [id number]  DOB - 02/07/1956  Chief Complaint  Patient presents with   Diabetes   Hypertension   Hyperlipidemia       Subjective:   [name] is a 68 y.o. female here today for a follow up visit. Patient has No headache, No chest pain, No abdominal pain - No Nausea, No new weakness tingling or numbness, No Cough - shortness of breath. Called Mr. [name] cardiology for elevated Bp stated would have f/u with her and an appointment. HPI  No problems updated.  Comprehensive ROS Pertinent positive and negative noted in HPI   No Known Allergies  Past Medical History:  Diagnosis Date   Anemia    hx   Aneurysm (HCC)    small, on left side of brain    Anxiety    hx of   Arthritis    generalized   CAD (coronary artery disease)    Diabetes mellitus without complication (HCC)    diet control, pt denies- on medications   GERD (gastroesophageal reflux disease)    iwht certain foods/on meds/OTC PRN meds   Heart failure with mildly reduced ejection fraction (HFmrEF) (HCC)    Hyperlipidemia    on meds   Hypertension    on meds   TIA (transient ischemic attack)    pt unaware of this hx on 06/19/2017    Current Outpatient Medications on File Prior to Visit  Medication Sig Dispense Refill   amLODipine  (NORVASC ) 5 MG tablet Take 1 tablet (5 mg total) by mouth daily. 90 tablet 1   aspirin  EC 81 MG tablet Take 1 tablet (81 mg total) by mouth daily.     clopidogrel  (PLAVIX ) 75 MG tablet Take 1 tablet (75 mg total) by mouth daily. 90 tablet 3   dicyclomine  (BENTYL ) 10 MG capsule Take 1 capsule (10 mg total) by mouth 3 (three) times daily before meals. 90 capsule 11   doxycycline  (VIBRAMYCIN ) 100 MG capsule Take 1 capsule (100 mg total) by mouth 2 (two) times daily. 20 capsule 0   DULoxetine  (CYMBALTA ) 30 MG capsule Take 1 capsule (30 mg total) by mouth daily. 180 capsule 1   Evolocumab  (REPATHA   SURECLICK) 140 MG/ML SOAJ Inject 140 mg into the skin every 14 (fourteen) days. 6 mL 3   ezetimibe  (ZETIA ) 10 MG tablet Take 1 tablet (10 mg total) by mouth daily. 90 tablet 3   fluconazole  (DIFLUCAN ) 150 MG tablet Take 1 tablet (150 mg total) by mouth daily. 1 tablet 1   furosemide  (LASIX ) 20 MG tablet Take 1 tablet (20 mg total) by mouth daily as needed. 90 tablet 3   hydrOXYzine  (ATARAX ) 25 MG tablet Take 1 tablet (25 mg total) by mouth every 6 (six) hours. 12 tablet 0   JANUVIA  25 MG tablet Take 25 mg by mouth daily.     losartan -hydrochlorothiazide  (HYZAAR ) 100-25 MG tablet Take 1 tablet by mouth daily. 90 tablet 3   methocarbamol  (ROBAXIN ) 500 MG tablet Take 1 tablet (500 mg total) by mouth every 8 (eight) hours as needed for muscle spasms. 20 tablet 0   methylPREDNISolone  (MEDROL ) 4 MG tablet To finish hospital course: 07/10/23 Take 1 tablet at supper, and 2 tabs at bedtime. on 07/11/23, take 1 tablet at breakfast, lunch, supper and bedtime (4 doses). On 1/17 take 1 tab at breakfast, lunch and bedtime. (3 doses) On 1/18, take 1 tab at breakfast and bedtime (2 doses), then  on 1/19 take 1 tab at breakfast. 13 tablet 0   metoprolol  tartrate (LOPRESSOR ) 100 MG tablet Take 1 tablet in the am and 1.5 tablet in the pm 75 tablet 1   pantoprazole  (PROTONIX ) 40 MG tablet TAKE ONE TABLET (40mg  total) BY MOUTH DAILY AT 9AM (Patient taking differently: Take 40 mg by mouth daily as needed (for heartburn).) 30 tablet 0   potassium chloride  SA (KLOR-CON  M) 20 MEQ tablet Take 1 tablet (20 mEq total) by mouth daily as needed (when taking furosemide  (lasix )). 90 tablet 3   pregabalin (LYRICA) 50 MG capsule Take 50 mg by mouth 3 (three) times daily.     rosuvastatin  (CRESTOR ) 40 MG tablet Take 1 tablet (40 mg total) by mouth daily. 90 tablet 3   No current facility-administered medications on file prior to visit.   Health Maintenance  Topic Date Due   Eye exam for diabetics  07/25/2018   Mammogram  01/03/2023    COVID-19 Vaccine (3 - 2024-25 season) 02/24/2023   Zoster (Shingles) Vaccine (2 of 2) 06/27/2023   Flu Shot  01/24/2024   Yearly kidney health urinalysis for diabetes  05/01/2024   Medicare Annual Wellness Visit  05/01/2024   Hemoglobin A1C  05/10/2024   Complete foot exam   06/11/2024   Yearly kidney function blood test for diabetes  10/31/2024   DEXA scan (bone density measurement)  11/12/2024   DTaP/Tdap/Td vaccine (2 - Td or Tdap) 04/26/2027   Colon Cancer Screening  02/13/2028   Pneumonia Vaccine  Completed   Hepatitis C Screening  Completed   HPV Vaccine  Aged Out   Meningitis B Vaccine  Aged Out    Objective:   Vitals:   11/08/23 0915 11/08/23 0916  BP: (!) 145/98 (!) 152/97  Pulse: (!) 104   Resp: 16   SpO2: 100%   Weight: 107 lb (48.5 kg)   Height: 5\' 1"  (1.549 m)    BP Readings from Last 3 Encounters:  11/08/23 (!) 152/97  11/01/23 (!) 122/96  10/08/23 (!) 130/93      Physical Exam Vitals reviewed.  Constitutional:      Appearance: Normal appearance.  HENT:     Head: Normocephalic.     Right Ear: Tympanic membrane, ear canal and external ear normal.     Left Ear: Tympanic membrane, ear canal and external ear normal.     Nose: Nose normal.     Mouth/Throat:     Mouth: Mucous membranes are moist.  Eyes:     Extraocular Movements: Extraocular movements intact.     Pupils: Pupils are equal, round, and reactive to light.  Cardiovascular:     Rate and Rhythm: Normal rate.  Pulmonary:     Effort: Pulmonary effort is normal.     Breath sounds: Normal breath sounds.  Abdominal:     General: Bowel sounds are normal.     Palpations: Abdomen is soft.  Musculoskeletal:        General: Normal range of motion.     Cervical back: Normal range of motion.  Skin:    General: Skin is warm and dry.  Neurological:     Mental Status: She is alert and oriented to person, place, and time.  Psychiatric:        Mood and Affect: Mood normal.        Behavior: Behavior  normal.        Thought Content: Thought content normal.       Assessment & Plan  [name] was seen today for diabetes, hypertension and hyperlipidemia.  Diagnoses and all orders for this visit:  Diabetes mellitus type 2 in nonobese (HCC) -     POCT glycosylated hemoglobin (Hb A1C)  Other orders -     dapagliflozin  propanediol (FARXIGA ) 10 MG TABS tablet; Take 1 tablet (10 mg total) by mouth daily before breakfast.   Patient have been counseled extensively about nutrition and exercise. Other issues discussed during this visit include: low cholesterol diet, weight control and daily exercise, foot care, annual eye examinations at Ophthalmology, importance of adherence with medications and regular follow-up. We also discussed long term complications of uncontrolled diabetes and hypertension.   No follow-ups on file.  The patient was given clear instructions to go to ER or return to medical center if symptoms don't improve, worsen or new problems develop. The patient verbalized understanding. The patient was told to call to get lab results if they haven't heard anything in the next week.   This note has been created with Education officer, environmental. Any transcriptional errors are unintentional.   Marius Siemens, NP 11/11/2023, 5:34 PM

## 2023-11-11 ENCOUNTER — Encounter (INDEPENDENT_AMBULATORY_CARE_PROVIDER_SITE_OTHER): Payer: Self-pay | Admitting: Primary Care

## 2023-11-13 ENCOUNTER — Encounter: Payer: Self-pay | Admitting: Pharmacist

## 2023-11-13 ENCOUNTER — Telehealth: Payer: Self-pay | Admitting: Cardiovascular Disease

## 2023-11-13 ENCOUNTER — Other Ambulatory Visit (HOSPITAL_BASED_OUTPATIENT_CLINIC_OR_DEPARTMENT_OTHER): Admitting: Pharmacist

## 2023-11-13 ENCOUNTER — Telehealth: Payer: Self-pay | Admitting: Nurse Practitioner

## 2023-11-13 ENCOUNTER — Telehealth (HOSPITAL_COMMUNITY): Payer: Self-pay

## 2023-11-13 DIAGNOSIS — E119 Type 2 diabetes mellitus without complications: Secondary | ICD-10-CM

## 2023-11-13 DIAGNOSIS — Z7984 Long term (current) use of oral hypoglycemic drugs: Secondary | ICD-10-CM

## 2023-11-13 NOTE — Telephone Encounter (Signed)
 Pt called and stated she is now interested in CR, adv pt I would pass her referral to RN for review again.

## 2023-11-13 NOTE — Progress Notes (Signed)
 Patient appeared on insurance report for not passing the quality metrics in 2024: Medication Adherence for Diabetes (MAD). I called her last week and authorized refill for her Farxiga . She has still not picked this up.  Outreach to the patient's pharmacy was Successful. The Farxiga  10 mg rxn sent last week was on hold d/t to insurance copay of >$100. 30-day supply is $47.00. I authorized the pharmacy to get this ready for her.  Marene Shape, PharmD, Becky Bowels, CPP Clinical Pharmacist [facility] [phone]

## 2023-11-13 NOTE — Telephone Encounter (Signed)
Attempted phone call to pt and left voicemail message to contact office at [phone]. 

## 2023-11-13 NOTE — Telephone Encounter (Signed)
 Contacted the patient and made her aware that we will place the referral to the prep program next week once [name] returns to the office.   Patient voiced understanding.

## 2023-11-13 NOTE — Telephone Encounter (Signed)
 Pt requesting cb regarding referral to exercise program she and E. Dick discussed. She has not heard anything back from us  yet.

## 2023-11-13 NOTE — Telephone Encounter (Signed)
 Patient calling about needing a clearance to be clear for a dental procedure. Explain that the office need to send us  the clearance over, in order determine if she can be cleared. Please advise

## 2023-11-15 ENCOUNTER — Telehealth (HOSPITAL_COMMUNITY): Payer: Self-pay

## 2023-11-15 NOTE — Telephone Encounter (Signed)
 Pt insurance is active and benefits verified through [employer]. Co-pay $15.00, DED $0.00/$0.00 met, out of pocket $3,400.00/$657.76 met, co-insurance 0%. No pre-authorization required. Sheetal/[employer], 11/15/23 @ 2:45PM, ZOX#[id number]   How many CR sessions are covered? (36 visits for TCR, 72 visits for ICR)72 Is this a lifetime maximum or an annual maximum? Annual Has the member used any of these services to date? No Is there a time limit (weeks/months) on start of program and/or program completion? No

## 2023-11-15 NOTE — Telephone Encounter (Signed)
 No clearance forms received as of yet. Pt aware of process per original phone note and have attempted to call her back once. Will close encounter at this time and await forms.

## 2023-11-19 ENCOUNTER — Telehealth (HOSPITAL_COMMUNITY): Payer: Self-pay

## 2023-11-19 ENCOUNTER — Encounter (HOSPITAL_COMMUNITY): Payer: Self-pay

## 2023-11-19 NOTE — Telephone Encounter (Signed)
Left message for the patient to contact the office. 

## 2023-11-19 NOTE — Telephone Encounter (Signed)
 Attempted to call patient in regards to Cardiac Rehab - LM on VM Mailed letter

## 2023-11-19 NOTE — Telephone Encounter (Signed)
 Patient is requesting to speak with CMA Tierica in regard to this previous conversation. Please advise.

## 2023-11-21 ENCOUNTER — Encounter (HOSPITAL_COMMUNITY): Payer: Self-pay

## 2023-11-21 NOTE — Telephone Encounter (Signed)
 Spoke with the patient who states she got a call today for cardiac rehab.

## 2023-11-22 ENCOUNTER — Telehealth: Payer: Self-pay | Admitting: Nurse Practitioner

## 2023-11-22 NOTE — Telephone Encounter (Signed)
 Left message for patient to call back

## 2023-11-22 NOTE — Telephone Encounter (Signed)
 Pt called in asking for anote to go back to work next week. 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 Please advise if this can be typed up for her to come pick up.

## 2023-11-25 NOTE — Telephone Encounter (Signed)
Patient is following up. Please advise. 

## 2023-11-25 NOTE — Telephone Encounter (Signed)
 Spoke with Pt. Letter written and will be left up front for her to pick up.

## 2023-11-26 ENCOUNTER — Telehealth: Payer: Self-pay | Admitting: Pharmacist

## 2023-11-26 NOTE — Telephone Encounter (Signed)
 Pt coming back today to pick up work letter

## 2023-11-26 NOTE — Telephone Encounter (Signed)
 Call for f/u lipid lab n/a, MyChart MSG sent.

## 2023-11-28 ENCOUNTER — Encounter (HOSPITAL_COMMUNITY)
Admission: RE | Admit: 2023-11-28 | Discharge: 2023-11-28 | Disposition: A | Discharge status: Home / Self Care | Source: Ambulatory Visit | Attending: Cardiovascular Disease | Admitting: Cardiovascular Disease

## 2023-11-28 VITALS — BP 110/66 | HR 72 | Ht 61.0 in | Wt 106.0 lb

## 2023-11-28 DIAGNOSIS — Z951 Presence of aortocoronary bypass graft: Secondary | ICD-10-CM | POA: Insufficient documentation

## 2023-11-28 LAB — GLUCOSE, CAPILLARY: Glucose-Capillary: 148 mg/dL — ABNORMAL HIGH (ref 70–99)

## 2023-11-28 NOTE — Progress Notes (Signed)
 Cardiac Rehab Medication Review   Does the patient  feel that his/her medications are working for him/her?  yes  Has the patient been experiencing any side effects to the medications prescribed?  no  Does the patient measure his/her own blood pressure or blood glucose at home?  no   Does the patient have any problems obtaining medications due to transportation or finances?   Sometimes  Understanding of regimen: good Understanding of indications: good Potential of compliance: good    Comments: Patient feels good with her medication routine and feels they all work. She does not have a glucose meter at home. She is also having some issues with her medication cost, especially repatha , she is working with pharmacy to get her next dose.     [name] S [name] 11/28/2023 12:27 PM

## 2023-11-28 NOTE — Progress Notes (Signed)
 Cardiac Individual Treatment Plan  Patient Details  Name: [name] MRN: [id number] Date of Birth: 1956/03/03 Referring Provider:   Flowsheet Row INTENSIVE CARDIAC REHAB ORIENT from 11/28/2023 in [facility]  Referring Provider Dr. 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  Patient's Home Medications on Admission:  Current Outpatient Medications:    amLODipine  (NORVASC ) 5 MG tablet, Take 1 tablet (5 mg total) by mouth daily., Disp: 90 tablet, Rfl: 1   aspirin  EC 81 MG tablet, Take 1 tablet (81 mg total) by mouth daily., Disp: , Rfl:    clopidogrel  (PLAVIX ) 75 MG tablet, Take 1 tablet (75 mg total) by mouth daily., Disp: 90 tablet, Rfl: 3   dapagliflozin  propanediol (FARXIGA ) 10 MG TABS tablet, Take 1 tablet (10 mg total) by mouth daily before breakfast., Disp: 90 tablet, Rfl: 1   dicyclomine  (BENTYL ) 10 MG capsule, Take 1 capsule (10 mg total) by mouth 3 (three) times daily before meals., Disp: 90 capsule, Rfl: 11   doxycycline  (VIBRAMYCIN ) 100 MG capsule, Take 1 capsule (100 mg total) by mouth 2 (two) times daily., Disp: 20 capsule, Rfl: 0   DULoxetine  (CYMBALTA ) 30 MG capsule, Take 1 capsule (30 mg total) by mouth daily., Disp: 180 capsule, Rfl: 1   Evolocumab  (REPATHA  SURECLICK) 140 MG/ML SOAJ, Inject 140 mg into the skin every 14 (fourteen) days., Disp: 6 mL, Rfl: 3   ezetimibe  (ZETIA ) 10 MG tablet, Take 1 tablet (10 mg total) by mouth daily., Disp: 90 tablet, Rfl: 3   fluconazole  (DIFLUCAN ) 150 MG tablet, Take 1 tablet (150 mg total) by mouth daily., Disp: 1 tablet, Rfl: 1   furosemide  (LASIX ) 20 MG tablet, Take 1 tablet (20 mg total) by mouth daily as needed., Disp: 90 tablet, Rfl: 3   hydrOXYzine  (ATARAX ) 25 MG tablet,  Take 1 tablet (25 mg total) by mouth every 6 (six) hours., Disp: 12 tablet, Rfl: 0   JANUVIA  25 MG tablet, Take 25 mg by mouth daily., Disp: , Rfl:    losartan -hydrochlorothiazide  (HYZAAR ) 100-25 MG tablet, Take 1 tablet by mouth daily., Disp: 90 tablet, Rfl: 3   methocarbamol  (ROBAXIN ) 500 MG tablet, Take 1 tablet (500 mg total) by mouth every 8 (eight) hours as needed for muscle spasms., Disp: 20 tablet, Rfl: 0   methylPREDNISolone  (MEDROL ) 4 MG tablet, To finish hospital course: 07/10/23 Take 1 tablet at supper, and 2 tabs at bedtime. on 07/11/23, take 1 tablet at breakfast, lunch, supper and bedtime (4 doses). On 1/17 take 1 tab at breakfast, lunch and bedtime. (3 doses) On 1/18, take 1 tab at breakfast and bedtime (2 doses), then on 1/19 take 1 tab at breakfast., Disp: 13 tablet, Rfl: 0   metoprolol  tartrate (LOPRESSOR ) 100 MG tablet, Take 1 tablet in the am and 1.5 tablet in the pm, Disp: 75 tablet, Rfl: 1   pantoprazole  (PROTONIX ) 40 MG tablet, TAKE ONE TABLET (40mg  total) BY MOUTH DAILY AT 9AM (Patient taking differently: Take 40 mg by mouth daily as needed (for heartburn).), Disp: 30 tablet, Rfl: 0   potassium chloride  SA (KLOR-CON  M) 20 MEQ tablet, Take 1 tablet (20 mEq total) by mouth daily as needed (when taking furosemide  (lasix ))., Disp: 90 tablet, Rfl:  3   pregabalin (LYRICA) 50 MG capsule, Take 50 mg by mouth 3 (three) times daily., Disp: , Rfl:    rosuvastatin  (CRESTOR ) 40 MG tablet, Take 1 tablet (40 mg total) by mouth daily., Disp: 90 tablet, Rfl: 3  Past Medical History: Past Medical History:  Diagnosis Date   Anemia    hx   Aneurysm (HCC)    small, on left side of brain    Anxiety    hx of   Arthritis    generalized   CAD (coronary artery disease)    Diabetes mellitus without complication (HCC)    diet control, pt denies- on medications   GERD (gastroesophageal reflux disease)    iwht certain foods/on meds/OTC PRN meds   Heart failure with mildly reduced ejection  fraction (HFmrEF) (HCC)    Hyperlipidemia    on meds   Hypertension    on meds   TIA (transient ischemic attack)    pt unaware of this hx on 06/19/2017    Tobacco Use: Social History   Tobacco Use  Smoking Status Former   Types: Cigarettes  Smokeless Tobacco Never    Labs: Review Flowsheet  More data exists      Latest Ref Rng & Units 05/03/2023 07/02/2023 07/03/2023 07/25/2023 11/08/2023  Labs for ITP Cardiac and Pulmonary Rehab  Cholestrol 100 - 199 mg/dL 161  - - 096  -  LDL (calc) 0 - 99 mg/dL 045  - - 409  -  HDL-C >39 mg/dL 64  - - 59  -  Trlycerides 0 - 149 mg/dL 811  - - 914  -  Hemoglobin A1c 0.0 - 7.0 % - - 6.2  - 7.3   PH, Arterial 7.35 - 7.45 - 7.47  7.321  7.318  7.429  7.453  7.414  7.390  7.458  7.420  - -  PCO2 arterial 32 - 48 mmHg - 32  46.7  46.4  38.1  36.7  40.8  33.2  30.2  27.1  - -  Bicarbonate 20.0 - 28.0 mmol/L - 23.3  24.1  23.8  25.6  25.7  26.1  20.1  22.9  21.4  17.6  - -  TCO2 22 - 32 mmol/L - - 26  25  27  27  28  27   37  21  24  22  24  20  18   - -  Acid-base deficit 0.0 - 2.0 mmol/L - - 2.0  2.0  5.0  2.0  2.0  6.0  - -  O2 Saturation % - 99.2  99  99  99  100  100  100  86  100  100  - -    Details       Multiple values from one day are sorted in reverse-chronological order         Capillary Blood Glucose: Lab Results  Component Value Date   GLUCAP 148 (H) 11/28/2023   GLUCAP 215 (H) 07/05/2023   GLUCAP 205 (H) 07/05/2023   GLUCAP 158 (H) 07/05/2023   GLUCAP 107 (H) 07/05/2023     Exercise Target Goals: Exercise Program Goal: Individual exercise prescription set using results from initial 6 min walk test and THRR while considering  patient's activity barriers and safety.   Exercise Prescription Goal: Initial exercise prescription builds to 30-45 minutes a day of aerobic activity, 2-3 days per week.  Home exercise guidelines will be given to patient during program as part of exercise prescription that the  participant will  acknowledge.  Activity Barriers & Risk Stratification:  Activity Barriers & Cardiac Risk Stratification - 11/28/23 1209       Activity Barriers & Cardiac Risk Stratification   Activity Barriers Deconditioning;Shortness of Breath;Decreased Ventricular Function;Balance Concerns;Neck/Spine Problems    Cardiac Risk Stratification High             6 Minute Walk:  6 Minute Walk     Row Name 11/28/23 1202         6 Minute Walk   Phase Initial     Distance 890 feet     Walk Time 6 minutes     # of Rest Breaks 1     MPH 1.69     METS 2.31     RPE 9     Perceived Dyspnea  1     VO2 Peak 8.08     Symptoms Yes (comment)     Comments standing rest break about 1/2 way about 30 seconds due to SOB and mild dizziness. Both resolved with the break and did not return     Resting HR 72 bpm     Resting BP 110/66     Resting Oxygen Saturation  98 %     Exercise Oxygen Saturation  during 6 min walk 96 %     Max Ex. HR 90 bpm     Max Ex. BP 120/80     2 Minute Post BP 110/78              Oxygen Initial Assessment:   Oxygen Re-Evaluation:   Oxygen Discharge (Final Oxygen Re-Evaluation):   Initial Exercise Prescription:  Initial Exercise Prescription - 11/28/23 1200       Date of Initial Exercise RX and Referring Provider   Date 11/28/23    Referring Provider Dr. Ahmad Alert MD    Expected Discharge Date 02/17/24      NuStep   Level 1    SPM 70    Minutes 30    METs 1.8      Prescription Details   Frequency (times per week) 3    Duration Progress to 30 minutes of continuous aerobic without signs/symptoms of physical distress      Intensity   THRR 40-80% of Max Heartrate 60-121    Ratings of Perceived Exertion 11-13    Perceived Dyspnea 0-4      Progression   Progression Continue progressive overload as per policy without signs/symptoms or physical distress.      Resistance Training   Training Prescription Yes    Weight 2    Reps 10-15              Perform Capillary Blood Glucose checks as needed.  Exercise Prescription Changes:   Exercise Comments:   Exercise Goals and Review:   Exercise Goals     Row Name 11/28/23 1211             Exercise Goals   Increase Physical Activity Yes       Intervention Provide advice, education, support and counseling about physical activity/exercise needs.;Develop an individualized exercise prescription for aerobic and resistive training based on initial evaluation findings, risk stratification, comorbidities and participant's personal goals.       Expected Outcomes Short Term: Attend rehab on a regular basis to increase amount of physical activity.;Long Term: Exercising regularly at least 3-5 days a week.;Long Term: Add in home exercise to make exercise part of routine and to increase amount of physical  activity.       Increase Strength and Stamina Yes       Intervention Provide advice, education, support and counseling about physical activity/exercise needs.;Develop an individualized exercise prescription for aerobic and resistive training based on initial evaluation findings, risk stratification, comorbidities and participant's personal goals.       Expected Outcomes Short Term: Increase workloads from initial exercise prescription for resistance, speed, and METs.;Long Term: Improve cardiorespiratory fitness, muscular endurance and strength as measured by increased METs and functional capacity ( );Short Term: Perform resistance training exercises routinely during rehab and add in resistance training at home       Able to understand and use rate of perceived exertion (RPE) scale Yes       Intervention Provide education and explanation on how to use RPE scale       Expected Outcomes Short Term: Able to use RPE daily in rehab to express subjective intensity level;Long Term:  Able to use RPE to guide intensity level when exercising independently       Knowledge and understanding of Target Heart  Rate Range (THRR) Yes       Intervention Provide education and explanation of THRR including how the numbers were predicted and where they are located for reference       Expected Outcomes Short Term: Able to state/look up THRR;Long Term: Able to use THRR to govern intensity when exercising independently;Short Term: Able to use daily as guideline for intensity in rehab       Understanding of Exercise Prescription Yes       Intervention Provide education, explanation, and written materials on patient's individual exercise prescription       Expected Outcomes Short Term: Able to explain program exercise prescription;Long Term: Able to explain home exercise prescription to exercise independently                Exercise Goals Re-Evaluation :   Discharge Exercise Prescription (Final Exercise Prescription Changes):   Nutrition:  Target Goals: Understanding of nutrition guidelines, daily intake of sodium 1500mg , cholesterol 200mg , calories 30% from fat and 7% or less from saturated fats, daily to have 5 or more servings of fruits and vegetables.  Biometrics:  Pre Biometrics - 11/28/23 1200       Pre Biometrics   Height 5\' 1"  (1.549 m)    Waist Circumference 29.8 inches    Hip Circumference 32.5 inches    Waist to Hip Ratio 0.92 %    Triceps Skinfold 9 mm    % Body Fat 27.3 %    Grip Strength 12 kg    Flexibility 13.5 in    Single Leg Stand 3 seconds              Nutrition Therapy Plan and Nutrition Goals:   Nutrition Assessments:  MEDIFICTS Score Key: >=70 Need to make dietary changes  40-70 Heart Healthy Diet <= 40 Therapeutic Level Cholesterol Diet    Picture Your Plate Scores: <21 Unhealthy dietary pattern with much room for improvement. 41-50 Dietary pattern unlikely to meet recommendations for good health and room for improvement. 51-60 More healthful dietary pattern, with some room for improvement.  >60 Healthy dietary pattern, although there may be some  specific behaviors that could be improved.    Nutrition Goals Re-Evaluation:   Nutrition Goals Re-Evaluation:   Nutrition Goals Discharge (Final Nutrition Goals Re-Evaluation):   Psychosocial: Target Goals: Acknowledge presence or absence of significant depression and/or stress, maximize coping skills, provide positive support system. Participant  is able to verbalize types and ability to use techniques and skills needed for reducing stress and depression.  Initial Review & Psychosocial Screening:  Initial Psych Review & Screening - 11/28/23 1212       Initial Review   Current issues with Current Anxiety/Panic;Current Stress Concerns    Source of Stress Concerns Family;Financial    Comments Pt does have some mild stress concerns, but has good family support with her niece, sister, and daughter. She feels her stress is controlled and has no additional needs at this time      Family Dynamics   Good Support System? Yes    Concerns Recent loss of significant other    Comments Mother and brother recently      Barriers   Psychosocial barriers to participate in program The patient should benefit from training in stress management and relaxation.      Screening Interventions   Interventions Encouraged to exercise;To provide support and resources with identified psychosocial needs;Provide feedback about the scores to participant    Expected Outcomes Short Term goal: Utilizing psychosocial counselor, staff and physician to assist with identification of specific Stressors or current issues interfering with healing process. Setting desired goal for each stressor or current issue identified.;Long Term Goal: Stressors or current issues are controlled or eliminated.;Short Term goal: Identification and review with participant of any Quality of Life or Depression concerns found by scoring the questionnaire.;Long Term goal: The participant improves quality of Life and PHQ9 Scores as seen by post  scores and/or verbalization of changes             Quality of Life Scores:  Quality of Life - 11/28/23 1216       Quality of Life   Select Quality of Life      Quality of Life Scores   Health/Function Pre 24.92 %    Socioeconomic Pre 25.71 %    Psych/Spiritual Pre 30 %    Family Pre 30 %    GLOBAL Pre 26.9 %            Scores of 19 and below usually indicate a poorer quality of life in these areas.  A difference of  2-3 points is a clinically meaningful difference.  A difference of 2-3 points in the total score of the Quality of Life Index has been associated with significant improvement in overall quality of life, self-image, physical symptoms, and general health in studies assessing change in quality of life.  PHQ-9: Review Flowsheet  More data exists      11/28/2023 11/08/2023 05/02/2023 12/10/2022 07/26/2022  Depression screen PHQ 2/9  Decreased Interest 0 0 0 0 0 0  Down, Depressed, Hopeless 0 0 1 0 0 0  PHQ - 2 Score 0 0 1 0 0 0  Altered sleeping 1 - 0 0 - -  Tired, decreased energy 1 - 1 0 - -  Change in appetite 1 - 0 1 - -  Feeling bad or failure about yourself  0 - 0 0 - -  Trouble concentrating 0 - 0 0 - -  Moving slowly or fidgety/restless 0 - 0 0 - -  Suicidal thoughts 0 - 0 0 - -  PHQ-9 Score 3 - 2 1 - -  Difficult doing work/chores Not difficult at all - - - -    Details       Multiple values from one day are sorted in reverse-chronological order        Interpretation of  Total Score  Total Score Depression Severity:  1-4 = Minimal depression, 5-9 = Mild depression, 10-14 = Moderate depression, 15-19 = Moderately severe depression, 20-27 = Severe depression   Psychosocial Evaluation and Intervention:   Psychosocial Re-Evaluation:   Psychosocial Discharge (Final Psychosocial Re-Evaluation):   Vocational Rehabilitation: Provide vocational rehab assistance to qualifying candidates.   Vocational Rehab Evaluation & Intervention:  Vocational  Rehab - 11/28/23 1222       Initial Vocational Rehab Evaluation & Intervention   Assessment shows need for Vocational Rehabilitation No   She has her return to work papers and has no additional needs at this time            Education: Education Goals: Education classes will be provided on a weekly basis, covering required topics. Participant will state understanding/return demonstration of topics presented.     Core Videos: Exercise    Move It!  Clinical staff conducted group or individual video education with verbal and written material and guidebook.  Patient learns the recommended Pritikin exercise program. Exercise with the goal of living a long, healthy life. Some of the health benefits of exercise include controlled diabetes, healthier blood pressure levels, improved cholesterol levels, improved heart and lung capacity, improved sleep, and better body composition. Everyone should speak with their doctor before starting or changing an exercise routine.  Biomechanical Limitations Clinical staff conducted group or individual video education with verbal and written material and guidebook.  Patient learns how biomechanical limitations can impact exercise and how we can mitigate and possibly overcome limitations to have an impactful and balanced exercise routine.  Body Composition Clinical staff conducted group or individual video education with verbal and written material and guidebook.  Patient learns that body composition (ratio of muscle mass to fat mass) is a key component to assessing overall fitness, rather than body weight alone. Increased fat mass, especially visceral belly fat, can put us  at increased risk for metabolic syndrome, type 2 diabetes, heart disease, and even death. It is recommended to combine diet and exercise (cardiovascular and resistance training) to improve your body composition. Seek guidance from your physician and exercise physiologist before implementing an  exercise routine.  Exercise Action Plan Clinical staff conducted group or individual video education with verbal and written material and guidebook.  Patient learns the recommended strategies to achieve and enjoy long-term exercise adherence, including variety, self-motivation, self-efficacy, and positive decision making. Benefits of exercise include fitness, good health, weight management, more energy, better sleep, less stress, and overall well-being.  Medical   Heart Disease Risk Reduction Clinical staff conducted group or individual video education with verbal and written material and guidebook.  Patient learns our heart is our most vital organ as it circulates oxygen, nutrients, white blood cells, and hormones throughout the entire body, and carries waste away. Data supports a plant-based eating plan like the Pritikin Program for its effectiveness in slowing progression of and reversing heart disease. The video provides a number of recommendations to address heart disease.   Metabolic Syndrome and Belly Fat  Clinical staff conducted group or individual video education with verbal and written material and guidebook.  Patient learns what metabolic syndrome is, how it leads to heart disease, and how one can reverse it and keep it from coming back. You have metabolic syndrome if you have 3 of the following 5 criteria: abdominal obesity, high blood pressure, high triglycerides, low HDL cholesterol, and high blood sugar.  Hypertension and Heart Disease Clinical staff conducted group or individual  video education with verbal and written material and guidebook.  Patient learns that high blood pressure, or hypertension, is very common in the [place] . Hypertension is largely due to excessive salt intake, but other important risk factors include being overweight, physical inactivity, drinking too much alcohol, smoking, and not eating enough potassium from fruits and vegetables. High blood pressure  is a leading risk factor for heart attack, stroke, congestive heart failure, dementia, kidney failure, and premature death. Long-term effects of excessive salt intake include stiffening of the arteries and thickening of heart muscle and organ damage. Recommendations include ways to reduce hypertension and the risk of heart disease.  Diseases of Our Time - Focusing on Diabetes Clinical staff conducted group or individual video education with verbal and written material and guidebook.  Patient learns why the best way to stop diseases of our time is prevention, through food and other lifestyle changes. Medicine (such as prescription pills and surgeries) is often only a Band-Aid on the problem, not a long-term solution. Most common diseases of our time include obesity, type 2 diabetes, hypertension, heart disease, and cancer. The Pritikin Program is recommended and has been proven to help reduce, reverse, and/or prevent the damaging effects of metabolic syndrome.  Nutrition   Overview of the Pritikin Eating Plan  Clinical staff conducted group or individual video education with verbal and written material and guidebook.  Patient learns about the Pritikin Eating Plan for disease risk reduction. The Pritikin Eating Plan emphasizes a wide variety of unrefined, minimally-processed carbohydrates, like fruits, vegetables, whole grains, and legumes. Go, Caution, and Stop food choices are explained. Plant-based and lean animal proteins are emphasized. Rationale provided for low sodium intake for blood pressure control, low added sugars for blood sugar stabilization, and low added fats and oils for coronary artery disease risk reduction and weight management.  Calorie Density  Clinical staff conducted group or individual video education with verbal and written material and guidebook.  Patient learns about calorie density and how it impacts the Pritikin Eating Plan. Knowing the characteristics of the food you choose  will help you decide whether those foods will lead to weight gain or weight loss, and whether you want to consume more or less of them. Weight loss is usually a side effect of the Pritikin Eating Plan because of its focus on low calorie-dense foods.  Label Reading  Clinical staff conducted group or individual video education with verbal and written material and guidebook.  Patient learns about the Pritikin recommended label reading guidelines and corresponding recommendations regarding calorie density, added sugars, sodium content, and whole grains.  Dining Out - Part 1  Clinical staff conducted group or individual video education with verbal and written material and guidebook.  Patient learns that restaurant meals can be sabotaging because they can be so high in calories, fat, sodium, and/or sugar. Patient learns recommended strategies on how to positively address this and avoid unhealthy pitfalls.  Facts on Fats  Clinical staff conducted group or individual video education with verbal and written material and guidebook.  Patient learns that lifestyle modifications can be just as effective, if not more so, as many medications for lowering your risk of heart disease. A Pritikin lifestyle can help to reduce your risk of inflammation and atherosclerosis (cholesterol build-up, or plaque, in the artery walls). Lifestyle interventions such as dietary choices and physical activity address the cause of atherosclerosis. A review of the types of fats and their impact on blood cholesterol levels, along with dietary recommendations  to reduce fat intake is also included.  Nutrition Action Plan  Clinical staff conducted group or individual video education with verbal and written material and guidebook.  Patient learns how to incorporate Pritikin recommendations into their lifestyle. Recommendations include planning and keeping personal health goals in mind as an important part of their success.  Healthy  Mind-Set    Healthy Minds, Bodies, Hearts  Clinical staff conducted group or individual video education with verbal and written material and guidebook.  Patient learns how to identify when they are stressed. Video will discuss the impact of that stress, as well as the many benefits of stress management. Patient will also be introduced to stress management techniques. The way we think, act, and feel has an impact on our hearts.  How Our Thoughts Can Heal Our Hearts  Clinical staff conducted group or individual video education with verbal and written material and guidebook.  Patient learns that negative thoughts can cause depression and anxiety. This can result in negative lifestyle behavior and serious health problems. Cognitive behavioral therapy is an effective method to help control our thoughts in order to change and improve our emotional outlook.  Additional Videos:  Exercise    Improving Performance  Clinical staff conducted group or individual video education with verbal and written material and guidebook.  Patient learns to use a non-linear approach by alternating intensity levels and lengths of time spent exercising to help burn more calories and lose more body fat. Cardiovascular exercise helps improve heart health, metabolism, hormonal balance, blood sugar control, and recovery from fatigue. Resistance training improves strength, endurance, balance, coordination, reaction time, metabolism, and muscle mass. Flexibility exercise improves circulation, posture, and balance. Seek guidance from your physician and exercise physiologist before implementing an exercise routine and learn your capabilities and proper form for all exercise.  Introduction to Yoga  Clinical staff conducted group or individual video education with verbal and written material and guidebook.  Patient learns about yoga, a discipline of the coming together of mind, breath, and body. The benefits of yoga include improved  flexibility, improved range of motion, better posture and core strength, increased lung function, weight loss, and positive self-image. Yoga's heart health benefits include lowered blood pressure, healthier heart rate, decreased cholesterol and triglyceride levels, improved immune function, and reduced stress. Seek guidance from your physician and exercise physiologist before implementing an exercise routine and learn your capabilities and proper form for all exercise.  Medical   Aging: Enhancing Your Quality of Life  Clinical staff conducted group or individual video education with verbal and written material and guidebook.  Patient learns key strategies and recommendations to stay in good physical health and enhance quality of life, such as prevention strategies, having an advocate, securing a Health Care Proxy and Power of Attorney, and keeping a list of medications and system for tracking them. It also discusses how to avoid risk for bone loss.  Biology of Weight Control  Clinical staff conducted group or individual video education with verbal and written material and guidebook.  Patient learns that weight gain occurs because we consume more calories than we burn (eating more, moving less). Even if your body weight is normal, you may have higher ratios of fat compared to muscle mass. Too much body fat puts you at increased risk for cardiovascular disease, heart attack, stroke, type 2 diabetes, and obesity-related cancers. In addition to exercise, following the Pritikin Eating Plan can help reduce your risk.  Decoding Lab Results  Clinical staff conducted group  or individual video education with verbal and written material and guidebook.  Patient learns that lab test reflects one measurement whose values change over time and are influenced by many factors, including medication, stress, sleep, exercise, food, hydration, pre-existing medical conditions, and more. It is recommended to use the knowledge  from this video to become more involved with your lab results and evaluate your numbers to speak with your doctor.   Diseases of Our Time - Overview  Clinical staff conducted group or individual video education with verbal and written material and guidebook.  Patient learns that according to the CDC, 50% to 70% of chronic diseases (such as obesity, type 2 diabetes, elevated lipids, hypertension, and heart disease) are avoidable through lifestyle improvements including healthier food choices, listening to satiety cues, and increased physical activity.  Sleep Disorders Clinical staff conducted group or individual video education with verbal and written material and guidebook.  Patient learns how good quality and duration of sleep are important to overall health and well-being. Patient also learns about sleep disorders and how they impact health along with recommendations to address them, including discussing with a physician.  Nutrition  Dining Out - Part 2 Clinical staff conducted group or individual video education with verbal and written material and guidebook.  Patient learns how to plan ahead and communicate in order to maximize their dining experience in a healthy and nutritious manner. Included are recommended food choices based on the type of restaurant the patient is visiting.   Fueling a Banker conducted group or individual video education with verbal and written material and guidebook.  There is a strong connection between our food choices and our health. Diseases like obesity and type 2 diabetes are very prevalent and are in large-part due to lifestyle choices. The Pritikin Eating Plan provides plenty of food and hunger-curbing satisfaction. It is easy to follow, affordable, and helps reduce health risks.  Menu Workshop  Clinical staff conducted group or individual video education with verbal and written material and guidebook.  Patient learns that restaurant  meals can sabotage health goals because they are often packed with calories, fat, sodium, and sugar. Recommendations include strategies to plan ahead and to communicate with the manager, chef, or server to help order a healthier meal.  Planning Your Eating Strategy  Clinical staff conducted group or individual video education with verbal and written material and guidebook.  Patient learns about the Pritikin Eating Plan and its benefit of reducing the risk of disease. The Pritikin Eating Plan does not focus on calories. Instead, it emphasizes high-quality, nutrient-rich foods. By knowing the characteristics of the foods, we choose, we can determine their calorie density and make informed decisions.  Targeting Your Nutrition Priorities  Clinical staff conducted group or individual video education with verbal and written material and guidebook.  Patient learns that lifestyle habits have a tremendous impact on disease risk and progression. This video provides eating and physical activity recommendations based on your personal health goals, such as reducing LDL cholesterol, losing weight, preventing or controlling type 2 diabetes, and reducing high blood pressure.  Vitamins and Minerals  Clinical staff conducted group or individual video education with verbal and written material and guidebook.  Patient learns different ways to obtain key vitamins and minerals, including through a recommended healthy diet. It is important to discuss all supplements you take with your doctor.   Healthy Mind-Set    Smoking Cessation  Clinical staff conducted group or individual video education with  verbal and written material and guidebook.  Patient learns that cigarette smoking and tobacco addiction pose a serious health risk which affects millions of people. Stopping smoking will significantly reduce the risk of heart disease, lung disease, and many forms of cancer. Recommended strategies for quitting are covered,  including working with your doctor to develop a successful plan.  Culinary   Becoming a Set designer conducted group or individual video education with verbal and written material and guidebook.  Patient learns that cooking at home can be healthy, cost-effective, quick, and puts them in control. Keys to cooking healthy recipes will include looking at your recipe, assessing your equipment needs, planning ahead, making it simple, choosing cost-effective seasonal ingredients, and limiting the use of added fats, salts, and sugars.  Cooking - Breakfast and Snacks  Clinical staff conducted group or individual video education with verbal and written material and guidebook.  Patient learns how important breakfast is to satiety and nutrition through the entire day. Recommendations include key foods to eat during breakfast to help stabilize blood sugar levels and to prevent overeating at meals later in the day. Planning ahead is also a key component.  Cooking - Educational psychologist conducted group or individual video education with verbal and written material and guidebook.  Patient learns eating strategies to improve overall health, including an approach to cook more at home. Recommendations include thinking of animal protein as a side on your plate rather than center stage and focusing instead on lower calorie dense options like vegetables, fruits, whole grains, and plant-based proteins, such as beans. Making sauces in large quantities to freeze for later and leaving the skin on your vegetables are also recommended to maximize your experience.  Cooking - Healthy Salads and Dressing Clinical staff conducted group or individual video education with verbal and written material and guidebook.  Patient learns that vegetables, fruits, whole grains, and legumes are the foundations of the Pritikin Eating Plan. Recommendations include how to incorporate each of these in flavorful and  healthy salads, and how to create homemade salad dressings. Proper handling of ingredients is also covered. Cooking - Soups and [employer] - Soups and Desserts Clinical staff conducted group or individual video education with verbal and written material and guidebook.  Patient learns that Pritikin soups and desserts make for easy, nutritious, and delicious snacks and meal components that are low in sodium, fat, sugar, and calorie density, while high in vitamins, minerals, and filling fiber. Recommendations include simple and healthy ideas for soups and desserts.   Overview     The Pritikin Solution Program Overview Clinical staff conducted group or individual video education with verbal and written material and guidebook.  Patient learns that the results of the Pritikin Program have been documented in more than 100 articles published in peer-reviewed journals, and the benefits include reducing risk factors for (and, in some cases, even reversing) high cholesterol, high blood pressure, type 2 diabetes, obesity, and more! An overview of the three key pillars of the Pritikin Program will be covered: eating well, doing regular exercise, and having a healthy mind-set.  WORKSHOPS  Exercise: Exercise Basics: Building Your Action Plan Clinical staff led group instruction and group discussion with PowerPoint presentation and patient guidebook. To enhance the learning environment the use of posters, models and videos may be added. At the conclusion of this workshop, patients will comprehend the difference between physical activity and exercise, as well as the benefits of incorporating both,  into their routine. Patients will understand the FITT (Frequency, Intensity, Time, and Type) principle and how to use it to build an exercise action plan. In addition, safety concerns and other considerations for exercise and cardiac rehab will be addressed by the presenter. The purpose of this lesson is to  promote a comprehensive and effective weekly exercise routine in order to improve patients' overall level of fitness.   Managing Heart Disease: Your Path to a Healthier Heart Clinical staff led group instruction and group discussion with PowerPoint presentation and patient guidebook. To enhance the learning environment the use of posters, models and videos may be added.At the conclusion of this workshop, patients will understand the anatomy and physiology of the heart. Additionally, they will understand how Pritikin's three pillars impact the risk factors, the progression, and the management of heart disease.  The purpose of this lesson is to provide a high-level overview of the heart, heart disease, and how the Pritikin lifestyle positively impacts risk factors.  Exercise Biomechanics Clinical staff led group instruction and group discussion with PowerPoint presentation and patient guidebook. To enhance the learning environment the use of posters, models and videos may be added. Patients will learn how the structural parts of their bodies function and how these functions impact their daily activities, movement, and exercise. Patients will learn how to promote a neutral spine, learn how to manage pain, and identify ways to improve their physical movement in order to promote healthy living. The purpose of this lesson is to expose patients to common physical limitations that impact physical activity. Participants will learn practical ways to adapt and manage aches and pains, and to minimize their effect on regular exercise. Patients will learn how to maintain good posture while sitting, walking, and lifting.  Balance Training and Fall Prevention  Clinical staff led group instruction and group discussion with PowerPoint presentation and patient guidebook. To enhance the learning environment the use of posters, models and videos may be added. At the conclusion of this workshop, patients will  understand the importance of their sensorimotor skills (vision, proprioception, and the vestibular system) in maintaining their ability to balance as they age. Patients will apply a variety of balancing exercises that are appropriate for their current level of function. Patients will understand the common causes for poor balance, possible solutions to these problems, and ways to modify their physical environment in order to minimize their fall risk. The purpose of this lesson is to teach patients about the importance of maintaining balance as they age and ways to minimize their risk of falling.  WORKSHOPS   Nutrition:  Fueling a Ship broker led group instruction and group discussion with PowerPoint presentation and patient guidebook. To enhance the learning environment the use of posters, models and videos may be added. Patients will review the foundational principles of the Pritikin Eating Plan and understand what constitutes a serving size in each of the food groups. Patients will also learn Pritikin-friendly foods that are better choices when away from home and review make-ahead meal and snack options. Calorie density will be reviewed and applied to three nutrition priorities: weight maintenance, weight loss, and weight gain. The purpose of this lesson is to reinforce (in a group setting) the key concepts around what patients are recommended to eat and how to apply these guidelines when away from home by planning and selecting Pritikin-friendly options. Patients will understand how calorie density may be adjusted for different weight management goals.  Mindful Eating  Clinical staff led  group instruction and group discussion with PowerPoint presentation and patient guidebook. To enhance the learning environment the use of posters, models and videos may be added. Patients will briefly review the concepts of the Pritikin Eating Plan and the importance of low-calorie dense foods. The  concept of mindful eating will be introduced as well as the importance of paying attention to internal hunger signals. Triggers for non-hunger eating and techniques for dealing with triggers will be explored. The purpose of this lesson is to provide patients with the opportunity to review the basic principles of the Pritikin Eating Plan, discuss the value of eating mindfully and how to measure internal cues of hunger and fullness using the Hunger Scale. Patients will also discuss reasons for non-hunger eating and learn strategies to use for controlling emotional eating.  Targeting Your Nutrition Priorities Clinical staff led group instruction and group discussion with PowerPoint presentation and patient guidebook. To enhance the learning environment the use of posters, models and videos may be added. Patients will learn how to determine their genetic susceptibility to disease by reviewing their family history. Patients will gain insight into the importance of diet as part of an overall healthy lifestyle in mitigating the impact of genetics and other environmental insults. The purpose of this lesson is to provide patients with the opportunity to assess their personal nutrition priorities by looking at their family history, their own health history and current risk factors. Patients will also be able to discuss ways of prioritizing and modifying the Pritikin Eating Plan for their highest risk areas  Menu  Clinical staff led group instruction and group discussion with PowerPoint presentation and patient guidebook. To enhance the learning environment the use of posters, models and videos may be added. Using menus brought in from [employer], or printed from [employer], patients will apply the Pritikin dining out guidelines that were presented in the [employer] video. Patients will also be able to practice these guidelines in a variety of provided scenarios. The purpose of this lesson  is to provide patients with the opportunity to practice hands-on learning of the Pritikin Dining Out guidelines with actual menus and practice scenarios.  Label Reading Clinical staff led group instruction and group discussion with PowerPoint presentation and patient guidebook. To enhance the learning environment the use of posters, models and videos may be added. Patients will review and discuss the Pritikin label reading guidelines presented in Pritikin's Label Reading Educational series video. Using fool labels brought in from local grocery stores and markets, patients will apply the label reading guidelines and determine if the packaged food meet the Pritikin guidelines. The purpose of this lesson is to provide patients with the opportunity to review, discuss, and practice hands-on learning of the Pritikin Label Reading guidelines with actual packaged food labels. Cooking School  Pritikin's [employer] are designed to teach patients ways to prepare quick, simple, and affordable recipes at home. The importance of nutrition's role in chronic disease risk reduction is reflected in its emphasis in the overall Pritikin program. By learning how to prepare essential core Pritikin Eating Plan recipes, patients will increase control over what they eat; be able to customize the flavor of foods without the use of added salt, sugar, or fat; and improve the quality of the food they consume. By learning a set of core recipes which are easily assembled, quickly prepared, and affordable, patients are more likely to prepare more healthy foods at home. These workshops focus on convenient breakfasts, simple  entres, side dishes, and desserts which can be prepared with minimal effort and are consistent with nutrition recommendations for cardiovascular risk reduction. Cooking [employer] are taught by a Armed forces logistics/support/administrative officer (RD) who has been trained by the [employer]. The chef or RD has  a clear understanding of the importance of minimizing - if not completely eliminating - added fat, sugar, and sodium in recipes. Throughout the series of Cooking School Workshop sessions, patients will learn about healthy ingredients and efficient methods of cooking to build confidence in their capability to prepare    Cooking School weekly topics:  Adding Flavor- Sodium-Free  Fast and Healthy Breakfasts  Powerhouse Plant-Based Proteins  Satisfying Salads and Dressings  Simple Sides and Sauces  International Cuisine-Spotlight on the [employer] Zones  Delicious Desserts  Savory Soups  [employer] - Meals in a Astronomer Appetizers and Snacks  Comforting Weekend Breakfasts  One-Pot Wonders   Fast Evening Meals  Landscape architect Your Pritikin Plate  WORKSHOPS   Healthy Mindset (Psychosocial):  Focused Goals, Sustainable Changes Clinical staff led group instruction and group discussion with PowerPoint presentation and patient guidebook. To enhance the learning environment the use of posters, models and videos may be added. Patients will be able to apply effective goal setting strategies to establish at least one personal goal, and then take consistent, meaningful action toward that goal. They will learn to identify common barriers to achieving personal goals and develop strategies to overcome them. Patients will also gain an understanding of how our mind-set can impact our ability to achieve goals and the importance of cultivating a positive and growth-oriented mind-set. The purpose of this lesson is to provide patients with a deeper understanding of how to set and achieve personal goals, as well as the tools and strategies needed to overcome common obstacles which may arise along the way.  From Head to Heart: The Power of a Healthy Outlook  Clinical staff led group instruction and group discussion with PowerPoint presentation and patient guidebook. To enhance the learning  environment the use of posters, models and videos may be added. Patients will be able to recognize and describe the impact of emotions and mood on physical health. They will discover the importance of self-care and explore self-care practices which may work for them. Patients will also learn how to utilize the 4 C's to cultivate a healthier outlook and better manage stress and challenges. The purpose of this lesson is to demonstrate to patients how a healthy outlook is an essential part of maintaining good health, especially as they continue their cardiac rehab journey.  Healthy Sleep for a Healthy Heart Clinical staff led group instruction and group discussion with PowerPoint presentation and patient guidebook. To enhance the learning environment the use of posters, models and videos may be added. At the conclusion of this workshop, patients will be able to demonstrate knowledge of the importance of sleep to overall health, well-being, and quality of life. They will understand the symptoms of, and treatments for, common sleep disorders. Patients will also be able to identify daytime and nighttime behaviors which impact sleep, and they will be able to apply these tools to help manage sleep-related challenges. The purpose of this lesson is to provide patients with a general overview of sleep and outline the importance of quality sleep. Patients will learn about a few of the most common sleep disorders. Patients will also be introduced to the concept of "sleep hygiene," and discover ways  to self-manage certain sleeping problems through simple daily behavior changes. Finally, the workshop will motivate patients by clarifying the links between quality sleep and their goals of heart-healthy living.   Recognizing and Reducing Stress Clinical staff led group instruction and group discussion with PowerPoint presentation and patient guidebook. To enhance the learning environment the use of posters, models and videos  may be added. At the conclusion of this workshop, patients will be able to understand the types of stress reactions, differentiate between acute and chronic stress, and recognize the impact that chronic stress has on their health. They will also be able to apply different coping mechanisms, such as reframing negative self-talk. Patients will have the opportunity to practice a variety of stress management techniques, such as deep abdominal breathing, progressive muscle relaxation, and/or guided imagery.  The purpose of this lesson is to educate patients on the role of stress in their lives and to provide healthy techniques for coping with it.  Learning Barriers/Preferences:  Learning Barriers/Preferences - 11/28/23 1219       Learning Barriers/Preferences   Learning Barriers Sight;Exercise Concerns   Pt has some issues with dizziness and unsteady feeling   Learning Preferences Audio;Computer/Internet;Group Instruction;Individual Instruction;Pictoral;Skilled Demonstration;Verbal Instruction;Video;Written Material             Education Topics:  Knowledge Questionnaire Score:  Knowledge Questionnaire Score - 11/28/23 1220       Knowledge Questionnaire Score   Pre Score 19/24             Core Components/Risk Factors/Patient Goals at Admission:  Personal Goals and Risk Factors at Admission - 11/28/23 1222       Core Components/Risk Factors/Patient Goals on Admission   Diabetes Yes    Intervention Provide education about signs/symptoms and action to take for hypo/hyperglycemia.;Provide education about proper nutrition, including hydration, and aerobic/resistive exercise prescription along with prescribed medications to achieve blood glucose in normal ranges: Fasting glucose 65-99 mg/dL    Expected Outcomes Short Term: Participant verbalizes understanding of the signs/symptoms and immediate care of hyper/hypoglycemia, proper foot care and importance of medication, aerobic/resistive  exercise and nutrition plan for blood glucose control.;Long Term: Attainment of HbA1C < 7%.    Heart Failure Yes    Intervention Provide a combined exercise and nutrition program that is supplemented with education, support and counseling about heart failure. Directed toward relieving symptoms such as shortness of breath, decreased exercise tolerance, and extremity edema.    Expected Outcomes Improve functional capacity of life;Short term: Attendance in program 2-3 days a week with increased exercise capacity. Reported lower sodium intake. Reported increased fruit and vegetable intake. Reports medication compliance.;Short term: Daily weights obtained and reported for increase. Utilizing diuretic protocols set by physician.;Long term: Adoption of self-care skills and reduction of barriers for early signs and symptoms recognition and intervention leading to self-care maintenance.    Hypertension Yes    Intervention Provide education on lifestyle modifcations including regular physical activity/exercise, weight management, moderate sodium restriction and increased consumption of fresh fruit, vegetables, and low fat dairy, alcohol moderation, and smoking cessation.;Monitor prescription use compliance.    Expected Outcomes Short Term: Continued assessment and intervention until BP is < 140/7mm HG in hypertensive participants. < 130/19mm HG in hypertensive participants with diabetes, heart failure or chronic kidney disease.;Long Term: Maintenance of blood pressure at goal levels.    Lipids Yes    Intervention Provide education and support for participant on nutrition & aerobic/resistive exercise along with prescribed medications to achieve LDL 70mg , HDL >40mg .  Expected Outcomes Short Term: Participant states understanding of desired cholesterol values and is compliant with medications prescribed. Participant is following exercise prescription and nutrition guidelines.;Long Term: Cholesterol controlled with  medications as prescribed, with individualized exercise RX and with personalized nutrition plan. Value goals: LDL < 70mg , HDL > 40 mg.    Stress Yes    Intervention Offer individual and/or small group education and counseling on adjustment to heart disease, stress management and health-related lifestyle change. Teach and support self-help strategies.;Refer participants experiencing significant psychosocial distress to appropriate mental health specialists for further evaluation and treatment. When possible, include family members and significant others in education/counseling sessions.    Expected Outcomes Short Term: Participant demonstrates changes in health-related behavior, relaxation and other stress management skills, ability to obtain effective social support, and compliance with psychotropic medications if prescribed.;Long Term: Emotional wellbeing is indicated by absence of clinically significant psychosocial distress or social isolation.    Personal Goal Other Yes    Personal Goal short term: know plan for home exercise. Long term: increase strength and energy    Intervention Will continue to monitor pt and progress WL as tolerated W/O sign or symptom    Expected Outcomes Pt will achieve her goals and increase strength             Core Components/Risk Factors/Patient Goals Review:    Core Components/Risk Factors/Patient Goals at Discharge (Final Review):    ITP Comments:  ITP Comments     Row Name 11/28/23 0937           ITP Comments Gaylyn Keas, MD: Medical Director. Introduction to the [facility] /Intensive Cardiac Rehab.  Initial orientation packet reviewed with the patient.                Comments: Participant attended orientation for the cardiac rehabilitation program on  11/28/2023  to perform initial intake and exercise walk test. Patient introduced to the [facility] education and orientation packet was reviewed. Completed 6-minute walk test,  measurements, initial ITP, and exercise prescription. Vital signs stable. Telemetry-normal sinus rhythm with rare PVC, mildly symptomatic. 1/2 way through walk test mild SOB and dizzy both resolved within 30 seconds   Service time was from 0930 to 1135.

## 2023-11-29 ENCOUNTER — Telehealth: Payer: Self-pay | Admitting: Cardiovascular Disease

## 2023-11-29 NOTE — Telephone Encounter (Signed)
 Pt says that she has a bad toothache.. she was unable to speak well on the phone... I strongly urged her to call her dentist but she says they are closed on Fridays.. but I told her to still try to call they probably have an emergency number... she says she will try Tylenol  for now and warm compress... but I advised to try and get through to be sure that she does not have an infection and she agrees.

## 2023-11-29 NOTE — Telephone Encounter (Signed)
 Pt has a bad tooth ache and wants to know what she can take with her heart conditions

## 2023-11-30 ENCOUNTER — Other Ambulatory Visit: Payer: Self-pay | Admitting: Nurse Practitioner

## 2023-12-02 ENCOUNTER — Encounter (HOSPITAL_COMMUNITY)
Admission: RE | Admit: 2023-12-02 | Discharge: 2023-12-02 | Disposition: A | Discharge status: Home / Self Care | Source: Ambulatory Visit | Attending: Cardiovascular Disease | Admitting: Cardiovascular Disease

## 2023-12-02 ENCOUNTER — Ambulatory Visit (INDEPENDENT_AMBULATORY_CARE_PROVIDER_SITE_OTHER): Admitting: Podiatry

## 2023-12-02 DIAGNOSIS — Z951 Presence of aortocoronary bypass graft: Secondary | ICD-10-CM | POA: Diagnosis not present

## 2023-12-02 DIAGNOSIS — Z91199 Patient's noncompliance with other medical treatment and regimen due to unspecified reason: Secondary | ICD-10-CM

## 2023-12-02 LAB — GLUCOSE, CAPILLARY
Glucose-Capillary: 199 mg/dL — ABNORMAL HIGH (ref 70–99)
Glucose-Capillary: 143 mg/dL — ABNORMAL HIGH (ref 70–99)

## 2023-12-02 NOTE — Progress Notes (Signed)
 No show

## 2023-12-02 NOTE — Progress Notes (Signed)
 Daily Session Note  Patient Details  Name: [name] MRN: [id number] Date of Birth: 26-Sep-1955 Referring Provider:   Flowsheet Row INTENSIVE CARDIAC REHAB ORIENT from 11/28/2023 in [facility]  Referring Provider Dr. Ahmad Alert MD       Encounter Date: 12/02/2023  Check In:  Session Check In - 12/02/23 1053       Check-In   Supervising physician immediately available to respond to emergencies [facility] MD immediately available    Physician(s) Palmer Bobo, MD    Location [facility]    Staff Present Rosezena Contes, MS, ACSM-CEP, CCRP, Exercise Physiologist;Olimpia Tinch, RN, BSN;Johnny Alexia Angelucci, MS, Exercise Physiologist;Olinty Gaylene Kays, MS, ACSM-CEP, Exercise Physiologist    Virtual Visit No    Medication changes reported     No    Fall or balance concerns reported    No    Tobacco Cessation No Change    Warm-up and Cool-down Performed as group-led instruction   CRP2 orientation today   Resistance Training Performed Yes    VAD Patient? No    PAD/SET Patient? No      Pain Assessment   Currently in Pain? No/denies    Pain Score 0-No pain    Multiple Pain Sites No             Capillary Blood Glucose: Results for orders placed or performed during the hospital encounter of 12/02/23 (from the past 24 hours)  Glucose, capillary     Status: Abnormal   Collection Time: 12/02/23 11:31 AM  Result Value Ref Range   Glucose-Capillary 143 (H) 70 - 99 mg/dL     Exercise Prescription Changes - 12/02/23 1037       Response to Exercise   Blood Pressure (Admit) 96/60    Blood Pressure (Exercise) 102/60    Blood Pressure (Exit) 113/77    Heart Rate (Admit) 84 bpm    Heart Rate (Exercise) 106 bpm    Heart Rate (Exit) 79 bpm    Rating of Perceived Exertion (Exercise) 13    Symptoms None    Comments Off to a good start with exercise.    Duration Continue with 30 min of aerobic exercise without  signs/symptoms of physical distress.    Intensity THRR unchanged      Progression   Progression Continue to progress workloads to maintain intensity without signs/symptoms of physical distress.    Average METs 2.3      Resistance Training   Training Prescription Yes    Weight 2 lbs    Reps 10-15    Time 10 Minutes      Interval Training   Interval Training No      NuStep   Level 1    SPM 81    Minutes 30    METs 2.3             Social History   Tobacco Use  Smoking Status Former   Types: Cigarettes  Smokeless Tobacco Never    Goals Met:  Exercise tolerated well No report of concerns or symptoms today Strength training completed today  Goals Unmet:  Not Applicable  Comments: Pt started cardiac rehab today.  Pt tolerated light exercise without difficulty. VSS, telemetry-Sinus Rhythm, asymptomatic.  Medication list reconciled. Pt denies barriers to medicaiton compliance.  PSYCHOSOCIAL ASSESSMENT:  PHQ-2. Pt exhibits positive coping skills, hopeful outlook with supportive family. No psychosocial needs identified at this time, no psychosocial interventions necessary.  Pt enjoys movies, reading, cross stitch and puzzles.   Pt oriented to exercise equipment and routine.    Understanding verbalized. Monte Antonio RN BSN    Dr. Gaylyn Keas is Medical Director for Cardiac Rehab at [facility].

## 2023-12-03 ENCOUNTER — Telehealth: Payer: Self-pay | Admitting: Cardiovascular Disease

## 2023-12-03 ENCOUNTER — Ambulatory Visit: Payer: Self-pay

## 2023-12-03 ENCOUNTER — Other Ambulatory Visit: Payer: Self-pay | Admitting: Pharmacist

## 2023-12-03 ENCOUNTER — Telehealth (INDEPENDENT_AMBULATORY_CARE_PROVIDER_SITE_OTHER): Payer: Self-pay | Admitting: Primary Care

## 2023-12-03 MED ORDER — ACCU-CHEK SOFTCLIX LANCETS MISC
2 refills | Status: AC
Start: 2023-12-03 — End: ?

## 2023-12-03 MED ORDER — ACCU-CHEK GUIDE W/DEVICE KIT
PACK | 0 refills | Status: DC
Start: 2023-12-03 — End: 2024-02-04

## 2023-12-03 MED ORDER — ACCU-CHEK GUIDE TEST VI STRP: 1.0000 | ORAL_STRIP | Freq: Every day | 2 refills | Status: AC

## 2023-12-03 NOTE — Telephone Encounter (Signed)
 Will forward to provider

## 2023-12-03 NOTE — Telephone Encounter (Signed)
 This started couple months ago - off an on.  Right arm from shoulder down toi the elbow hurts her.  Off and on. No jaw pain, no pain in chest.  Not in back at all.  Taking Tylenol  but last night it woke her up and kept her up.  Same pain sometimes in left arm.  Repositioning didn't really help.  Aching currently.  Goes away for a few days  started cardiac rehab yesterday.  She doesn't feel like heart issues but PCP asked her to call cardiology first.   This pain is nothing like her symptoms prior to bypass surgery.    Offered to schedule with APP but she felt she would reach out to urgent care or PCP first.  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  Appreciative for guidance provided today.  Will call back if any further needs.

## 2023-12-03 NOTE — Telephone Encounter (Signed)
Luke would you be able to assist with this  °

## 2023-12-03 NOTE — Telephone Encounter (Signed)
 Pt c/o of Chest Pain: STAT if active (IN THIS MOMENT) CP, including tightness, pressure, jaw pain, shoulder/upper arm/back pain, SOB, nausea, and vomiting.  1. Are you having CP right now (tightness, pressure, or discomfort)? Pain in the shoulders areas now, specifically both arms and shoulders.   2. Are you experiencing any other symptoms (ex. SOB, nausea, vomiting, sweating)? Has a hard time sleeping, pt didn't go to sleep until 5am this morning. Sometimes it keeps her up all night.  3. How long have you been experiencing CP? About 2 to 3 months   4. Is your CP continuous or coming and going? Coming and going but this time it's more painful and really in the right arm more than the left.   5. Have you taken Nitroglycerin ? No  6. If CP returns before callback, please consider calling 911. ?

## 2023-12-03 NOTE — Telephone Encounter (Signed)
 FYI Only or Action Required?: Action required by provider  Patient was last seen in primary care on 11/08/2023 by Marius Siemens, NP. Called Nurse Triage reporting Arm Pain. Symptoms began several weeks ago. Interventions attempted: OTC medications: Tylenol . Symptoms are: gradually worsening.  Triage Disposition: See Physician Within 24 Hours  Patient/caregiver understands and will follow disposition?: Yes       Patient will call Cardiologist and if Cardiology will not see her - she will call Nurse Triage back to schedule an Urgent Care appt     Copied from CRM [id number]. Topic: Clinical - Medical Advice >> Dec 03, 2023  2:48 PM Yolanda T wrote: Reason for CRM: patient called stated she has been having pain her right arm that then goes into her left arm. The pain is so bad that it keeps her up at night and has been going on for weeks. Tylenol  will take the edge off but for a short period of time. Please fu with patient Reason for Disposition  Numbness (i.e., loss of sensation) in hand or fingers    Patient will call Cardiologist and if Cardiology will not see her - she will call Nurse Triage back to schedule an Urgent Care appt  Answer Assessment - Initial Assessment Questions 1. ONSET: "When did the pain start?"     "A few weeks" - not constant. Comes and goes. 2. LOCATION: "Where is the pain located?"     Both arms intermittently 3. PAIN: "How bad is the pain?" (Scale 1-10; or mild, moderate, severe)   - MILD (1-3): Doesn't interfere with normal activities.   - MODERATE (4-7): Interferes with normal activities (e.g., work or school) or awakens from sleep.   - SEVERE (8-10): Excruciating pain, unable to do any normal activities, unable to hold a cup of water.     Patient states it is interfering with her sleep and wakes her up. Tylenol  is not helping. 4. WORK OR EXERCISE: "Has there been any recent work or exercise that involved this part of the body?"     No 5. CAUSE: "What  do you think is causing the arm pain?"     Patient is uncertain 6. OTHER SYMPTOMS: "Do you have any other symptoms?" (e.g., neck pain, swelling, rash, fever, numbness, weakness)     Left hand gets numbness/tingling intermittently  Patient had open heart surgery 5 months ago. Just got clearance to return to work. Denies chest pain, jaw pain, arm swelling Patient states when she was having low BP yesterday at appt.  Protocols used: Arm Pain-A-AH

## 2023-12-03 NOTE — Progress Notes (Signed)
 [name] does not have a CBG meter. Patient's primary care provider Marcia Schick NP was called to see if the patient will benefit from having a CBG meter to check her CBG's at home.Monte Antonio RN BSN

## 2023-12-03 NOTE — Telephone Encounter (Signed)
 Copied from CRM [id number]. Topic: General - Other >> Dec 03, 2023  8:19 AM Howard Macho wrote: Reason for CRM: Shelagh Derrick from [facility] called stating the patient started cardiac rehab and Shelagh Derrick is checking to see if the doctor can prescribe a glucose meter to check the patient blood sugar. Shelagh Derrick stated the patient pre-blood sugar was 199 and her post was 143. Shelagh Derrick stated it is good for her to monitor her sugar. Patient is on januvia  and farxiga  and if the doctor think it is ok to prescribe then the patient pharmacy is [facility] [address].   CB [phone]

## 2023-12-04 ENCOUNTER — Telehealth: Payer: Self-pay | Admitting: Physician Assistant

## 2023-12-04 ENCOUNTER — Encounter (HOSPITAL_COMMUNITY)
Admission: RE | Admit: 2023-12-04 | Discharge: 2023-12-04 | Disposition: A | Discharge status: Home / Self Care | Source: Ambulatory Visit | Attending: Cardiovascular Disease | Admitting: Cardiovascular Disease

## 2023-12-04 DIAGNOSIS — Z951 Presence of aortocoronary bypass graft: Secondary | ICD-10-CM

## 2023-12-04 LAB — GLUCOSE, CAPILLARY
Glucose-Capillary: 147 mg/dL — ABNORMAL HIGH (ref 70–99)
Glucose-Capillary: 193 mg/dL — ABNORMAL HIGH (ref 70–99)

## 2023-12-04 NOTE — Telephone Encounter (Signed)
 Patient called after-hours answering service complaining of low blood pressure.  Patient has a history of CABG.  Systolic blood pressure has been in the 80s.  I asked the patient to hold her amlodipine  and also her losartan -HCTZ tomorrow morning.  She has already taken the nighttime dose of metoprolol  tonight.  I asked her to drink more fluids tonight to get her blood pressure up.  She should call our office first thing tomorrow morning to schedule earlier appointment to adjust her medication.  I asked her to go to the emergency room if she develop any feeling of passing out, near passing out, chest discomfort or shortness of breath.  At this time, she denies any chest pain or shortness of breath.

## 2023-12-05 ENCOUNTER — Encounter (INDEPENDENT_AMBULATORY_CARE_PROVIDER_SITE_OTHER): Admitting: Emergency Medicine

## 2023-12-05 ENCOUNTER — Encounter: Payer: Self-pay | Admitting: Emergency Medicine

## 2023-12-05 VITALS — BP 106/68 | HR 76 | Ht 61.0 in | Wt 108.0 lb

## 2023-12-05 DIAGNOSIS — R000 Tachycardia, unspecified: Secondary | ICD-10-CM

## 2023-12-05 DIAGNOSIS — Z79899 Other long term (current) drug therapy: Secondary | ICD-10-CM

## 2023-12-05 DIAGNOSIS — E785 Hyperlipidemia, unspecified: Secondary | ICD-10-CM | POA: Diagnosis not present

## 2023-12-05 DIAGNOSIS — I959 Hypotension, unspecified: Secondary | ICD-10-CM | POA: Diagnosis not present

## 2023-12-05 DIAGNOSIS — I2510 Atherosclerotic heart disease of native coronary artery without angina pectoris: Secondary | ICD-10-CM

## 2023-12-05 DIAGNOSIS — I5022 Chronic systolic (congestive) heart failure: Secondary | ICD-10-CM

## 2023-12-05 NOTE — Telephone Encounter (Signed)
 Spoke with the patient. She is holding her medications as instructed. She just got up. Appointment made for today at 2:45 with Palmer Bobo, NP. She verbalized understanding and is aware of new address and location.

## 2023-12-05 NOTE — Patient Instructions (Addendum)
 Medication Instructions:  STOP TAKING AMLODIPINE .  CONTINUE WITH ALL OTHER MEDICATION THERAPY.   Lab Work: BMET TO BE DONE TODAY.   Testing/Procedures: NONE  Follow-Up: At [facility], you and your health needs are our priority.  As part of our continuing mission to provide you with exceptional heart care, our providers are all part of one team.  This team includes your primary Cardiologist (physician) and Advanced Practice Providers or APPs (Physician Assistants and Nurse Practitioners) who all work together to provide you with the care you need, when you need it.  Your next appointment:   4-6 WEEKS  Provider:   Palmer Bobo, [place]

## 2023-12-05 NOTE — Progress Notes (Signed)
 Cardiology Office Note:    Date:  12/05/2023  ID:  [name], DOB Jun 23, 1956, MRN [id number] PCP: Marcia Siemens, NP  [name] Providers Cardiologist:  Marcia Alert, MD       Patient Profile:       Chief Complaint: Acute visit for hypotension History of Present Illness:  [name] is a 68 y.o. female with visit-pertinent history of coronary artery disease s/p CABG x 4 on 1/25, HFrEF, hypertension, hyperlipidemia, type 2 diabetes, TIA, cerebral aneurysm, anemia, GERD  She remotely saw Dr. Avanell Hale in 2016.  She was then previously seen by PCV for diagnosis of resistant hypertension.  Renal artery duplex 10/2021 showed no renal artery stenosis and simple right kidney cyst.  More recently she was admitted with chest pain, mildly elevated troponin, and hypertensive urgency in the setting of being without blood pressure meds for 3 months.  She was found to have an EF of 40 to 45% and multivessel coronary artery disease underwent CABG x 4 on 07/03/2023.  Full echo had otherwise shown grade 1 diastolic dysfunction, severe LAE.  IntraOp TEE showed LVEF 45-50%.  Postop course was notable for anemia and left pleural effusion requiring thoracentesis attempted not enough fluid to tap.  She was recommended for DAPT given elevated enzymes.  She went to ED 08/19/23 for atypical chest pain with EKG in NSR, negative troponins. Hydroxyzine  was prescribed for anxiety. She was advised to start Repatha .  She also was seen in the ED on 08/19/2023 as well as 10/06/2023 with complaint of chest pain that occurred while shopping.  She underwent workup and ACS was ruled out.  She was seen most recently on 10/08/23 with complaint of chest pain and troponins were negative x 2 with patient feeling improvement after GI cocktail.   She was last seen in office on 11/01/2023 by [name], [name].  She noted to be experiencing fatigue particularly in her mornings following her CABG. She was without any anginal  symptoms.  Her blood pressure and heart rate was noted to be elevated.  She was started on amlodipine  5 mg daily.  Her metoprolol  tartrate 100 mg twice daily was also increased to 100 mg in the a.m. and 150 mg in the p.m.  She called into the nurse triage line on 12/04/2023 noting hypotension.  She was told to hold her medications at that time.   Discussed the use of AI scribe software for clinical note transcription with the patient, who gave verbal consent to proceed.  Today patient tells me she is not doing well.  She has been experiencing low blood pressure readings over the past 2 days.  Yesterday she began to feel lightheaded and dizzy.  [facility] and was noted to be 80/40.  She has not been able to take her blood pressure at home as she does not have a blood pressure cuff.  Since this reading she has not taken any of her blood pressure medications including amlodipine , losartan -HCTZ, and metoprolol .  Today she notes she feels better and her lightheadedness/dizziness have improved slightly.  Her blood pressure in office is 106/68.  Overall she denies any chest pain or dyspnea.  No orthopnea, PND, palpitations, syncope, presyncope.  Review of systems:  Please see the history of present illness. All other systems are reviewed and otherwise negative.      Studies Reviewed:        Echocardiogram 06/24/2023 1. Left ventricular ejection fraction,  by estimation, is 40 to 45%. The  left ventricle has mildly decreased function. The left ventricle  demonstrates regional wall motion abnormalities (see scoring  diagram/findings for description). There is mild  concentric left ventricular hypertrophy. Left ventricular diastolic  parameters are consistent with Grade I diastolic dysfunction (impaired  relaxation). Elevated left ventricular end-diastolic pressure.   2. Right ventricular systolic function is normal. The right ventricular  size is normal.   3. Left  atrial size was severely dilated.   4. The mitral valve is normal in structure. Trivial mitral valve  regurgitation. No evidence of mitral stenosis.   5. The aortic valve is normal in structure. Aortic valve regurgitation is  not visualized. No aortic stenosis is present.   6. The inferior vena cava is normal in size with greater than 50%  respiratory variability, suggesting right atrial pressure of 3 mmHg.   Cardiac catheterization 06/27/2023 RECOMMENDATION: Transfer patient to [name] under the Cardiology service  Patient will require CVTS consultation for CABG => Anticipated discharge date to be determined. Will require additional GDMT management of Hypertension, CAD and CHf   Will restart treatment /ACS dose of enoxaparin   With ACS presentation would not be unreasonable to load clopidogrel  300 mg and start 75 mg daily post CABG Diagnostic Dominance: Right   Risk Assessment/Calculations:              Physical Exam:   VS:  BP 106/68 (BP Location: Left Arm, Patient Position: Sitting, Cuff Size: Normal)   Pulse 76   Ht 5' 1 (1.549 m)   Wt 108 lb (49 kg)   BMI 20.41 kg/m    Wt Readings from Last 3 Encounters:  12/05/23 108 lb (49 kg)  11/28/23 106 lb 0.7 oz (48.1 kg)  11/08/23 107 lb (48.5 kg)    GEN: Well nourished, well developed in no acute distress NECK: No JVD; No carotid bruits CARDIAC: RRR, no murmurs, rubs, gallops RESPIRATORY:  Clear to auscultation without rales, wheezing or rhonchi  ABDOMEN: Soft, non-tender, non-distended EXTREMITIES:  No edema; No acute deformity      Assessment and Plan:  Hypotension Hypertension Patient now with hypotension with associated lightheadedness/dizziness after recent medication change Blood pressure today is 106/68.  [facility] and medications were subsequently held at that time. Was recently started on amlodipine  5 mg and metoprolol  tartrate was increased from 100 mg twice daily to 100 mg in  the a.m. and 150 mg in the p.m. on 11/01/2023 due to hypertension/tachycardia where BP at that time was 128/90 and HR 106 bpm - Plan to discontinue amlodipine  5 mg at this time - Continue/restart losartan -hydrochlorothiazide  100-25 mg daily and metoprolol  tartrate 100 mg in the a.m. and 150 mg in the p.m. - Now has home BP cuff.  Will monitor BP x2 daily over the next week and log.  Will send BP log over MyChart in 1 week for me to review and notify office for any further hypotensive readings - She will also have her blood pressures drawn at cardiac rehab - BMET today  Coronary artery disease S/p CABG x 4 on 06/2023 - Today patient is without any anginal symptoms, no indication for further ischemic evaluation at this time - Continue with cardiac rehab - Continue GDMT with aspirin  81 mg daily, Plavix  75 mg daily, Repatha  140 mg q. 14 days, ezetimibe  10 mg, metoprolol  100 mg in the a.m. and 150 mg in the p.m., and rosuvastatin  40 g daily  HFmrEF Echocardiogram 05/2023 with LVEF 40 to 45%, anterior/lateral wall hypokinesis, mild LVH, grade 1 DD - Today patient is euvolemic and well compensated on exam.  NYHA class I - Unable to further titrate GDMT today given evidence of hypotension - Continue Farxiga  5 mg daily, losartan  100-25 mg daily and metoprolol  100 mg in the a.m. and 150 mg in the p.m.  Sinus tachycardia Heart rate today is well-controlled at 76 bpm - Continue metoprolol  as prescribed - Begin monitoring HR at home daily  Hyperlipidemia LDL 127 on 06/2023 - On Repatha  140 mg q. 14 days, Zetia  10 mg daily, rosuvastatin  40 mg daily - Started Repatha  on 09/2023 - Will repeat fasting lipid panel at follow-up visit       Dispo:  Return in about 6 weeks (around 01/16/2024).  Signed, Ava Boatman, NP

## 2023-12-06 ENCOUNTER — Encounter (HOSPITAL_COMMUNITY)
Admission: RE | Admit: 2023-12-06 | Discharge: 2023-12-06 | Disposition: A | Discharge status: Home / Self Care | Source: Ambulatory Visit | Attending: Cardiovascular Disease | Admitting: Cardiovascular Disease

## 2023-12-06 DIAGNOSIS — Z951 Presence of aortocoronary bypass graft: Secondary | ICD-10-CM

## 2023-12-06 LAB — BASIC METABOLIC PANEL WITH GFR
BUN/Creatinine Ratio: 29 — ABNORMAL HIGH (ref 12–28)
BUN: 21 mg/dL (ref 8–27)
CO2: 26 mmol/L (ref 20–29)
Calcium: 9.9 mg/dL (ref 8.7–10.3)
Chloride: 100 mmol/L (ref 96–106)
Creatinine, Ser: 0.73 mg/dL (ref 0.57–1.00)
Glucose: 153 mg/dL — ABNORMAL HIGH (ref 70–99)
Potassium: 4.2 mmol/L (ref 3.5–5.2)
Sodium: 144 mmol/L (ref 134–144)
eGFR: 89 mL/min/{1.73_m2} (ref >59)

## 2023-12-06 NOTE — Telephone Encounter (Signed)
 Contacted pt and have scheduled her an appt for 6/17 at 830

## 2023-12-08 ENCOUNTER — Ambulatory Visit: Payer: Self-pay | Admitting: Emergency Medicine

## 2023-12-09 ENCOUNTER — Encounter (HOSPITAL_COMMUNITY)
Admission: RE | Admit: 2023-12-09 | Discharge: 2023-12-09 | Disposition: A | Discharge status: Home / Self Care | Source: Ambulatory Visit | Attending: Cardiovascular Disease | Admitting: Cardiovascular Disease

## 2023-12-09 DIAGNOSIS — Z951 Presence of aortocoronary bypass graft: Secondary | ICD-10-CM

## 2023-12-10 ENCOUNTER — Ambulatory Visit: Admitting: Podiatry

## 2023-12-10 ENCOUNTER — Ambulatory Visit (INDEPENDENT_AMBULATORY_CARE_PROVIDER_SITE_OTHER): Admitting: Primary Care

## 2023-12-10 ENCOUNTER — Encounter (INDEPENDENT_AMBULATORY_CARE_PROVIDER_SITE_OTHER): Payer: Self-pay | Admitting: Primary Care

## 2023-12-10 VITALS — BP 121/81 | HR 70 | Resp 16 | Wt 106.0 lb

## 2023-12-10 DIAGNOSIS — G8929 Other chronic pain: Secondary | ICD-10-CM | POA: Diagnosis not present

## 2023-12-10 DIAGNOSIS — M25512 Pain in left shoulder: Secondary | ICD-10-CM

## 2023-12-10 DIAGNOSIS — M25511 Pain in right shoulder: Secondary | ICD-10-CM | POA: Diagnosis not present

## 2023-12-10 NOTE — Progress Notes (Signed)
 [facility]  [name], is a 68 y.o. female  LKG:[id number]  GUY:[id number]  DOB - Jun 06, 1956  Chief Complaint  Patient presents with   Arm Pain    Right  Pain radiates from shoulder down to hand       Subjective:   [name] is a 68 y.o. female here today for an acute visit.  She first expressed feeling a little sad and she lost her best friend in the past week loneliness from her setting in.  She is actually handling it pretty well.  Patient made the appointment for bilateral shoulder pain mostly right arm that radiates down to her hand.  She is currently in cardiac rehab for an hour stretching, liftin restrictions just do stretching exercises and activities using her arms and legs.  Explained to patient since she was already in a physical therapy rehab pain may have been an increase by using her arms more.  After she is discharged from cardiac rehab and she is still having bilateral shoulder pain we will really evaluate referring to orthopedics.  Patient is in agreement.   Arm Pain     No problems updated.  Comprehensive ROS Pertinent positive and negative noted in HPI   No Known Allergies  Past Medical History:  Diagnosis Date   Anemia    hx   Aneurysm (HCC)    small, on left side of brain    Anxiety    hx of   Arthritis    generalized   CAD (coronary artery disease)    Diabetes mellitus without complication (HCC)    diet control, pt denies- on medications   GERD (gastroesophageal reflux disease)    iwht certain foods/on meds/OTC PRN meds   Heart failure with mildly reduced ejection fraction (HFmrEF) (HCC)    Hyperlipidemia    on meds   Hypertension    on meds   TIA (transient ischemic attack)    pt unaware of this hx on 06/19/2017    Current Outpatient Medications on File Prior to Visit  Medication Sig Dispense Refill   Accu-Chek Softclix Lancets lancets Use to check blood sugar once daily. 100 each 2   aspirin  EC 81 MG  tablet Take 1 tablet (81 mg total) by mouth daily.     Blood Glucose Monitoring Suppl (ACCU-CHEK GUIDE) w/Device KIT Use to check blood sugar once daily. 1 kit 0   clopidogrel  (PLAVIX ) 75 MG tablet Take 1 tablet (75 mg total) by mouth daily. 90 tablet 3   dapagliflozin  propanediol (FARXIGA ) 10 MG TABS tablet Take 1 tablet (10 mg total) by mouth daily before breakfast. 90 tablet 1   dicyclomine  (BENTYL ) 10 MG capsule Take 1 capsule (10 mg total) by mouth 3 (three) times daily before meals. 90 capsule 11   doxycycline  (VIBRAMYCIN ) 100 MG capsule Take 1 capsule (100 mg total) by mouth 2 (two) times daily. 20 capsule 0   DULoxetine  (CYMBALTA ) 30 MG capsule Take 1 capsule (30 mg total) by mouth daily. 180 capsule 1   Evolocumab  (REPATHA  SURECLICK) 140 MG/ML SOAJ Inject 140 mg into the skin every 14 (fourteen) days. 6 mL 3   ezetimibe  (ZETIA ) 10 MG tablet Take 1 tablet (10 mg total) by mouth daily. 90 tablet 3   fluconazole  (DIFLUCAN ) 150 MG tablet Take 1 tablet (150 mg total) by mouth daily. 1 tablet 1   furosemide  (LASIX ) 20 MG tablet Take 1 tablet (20 mg total) by mouth daily as needed. 90 tablet 3  glucose blood (ACCU-CHEK GUIDE TEST) test strip Use to check blood sugar once daily. 100 each 2   hydrOXYzine  (ATARAX ) 25 MG tablet Take 1 tablet (25 mg total) by mouth every 6 (six) hours. 12 tablet 0   JANUVIA  25 MG tablet Take 25 mg by mouth daily.     losartan -hydrochlorothiazide  (HYZAAR ) 100-25 MG tablet Take 1 tablet by mouth daily. 90 tablet 3   methocarbamol  (ROBAXIN ) 500 MG tablet Take 1 tablet (500 mg total) by mouth every 8 (eight) hours as needed for muscle spasms. 20 tablet 0   methylPREDNISolone  (MEDROL ) 4 MG tablet To finish hospital course: 07/10/23 Take 1 tablet at supper, and 2 tabs at bedtime. on 07/11/23, take 1 tablet at breakfast, lunch, supper and bedtime (4 doses). On 1/17 take 1 tab at breakfast, lunch and bedtime. (3 doses) On 1/18, take 1 tab at breakfast and bedtime (2 doses), then  on 1/19 take 1 tab at breakfast. 13 tablet 0   metoprolol  tartrate (LOPRESSOR ) 100 MG tablet TAKE 1 TABLET IN THE AM AND 1.5 TABLETS IN THE PM 225 tablet 3   pantoprazole  (PROTONIX ) 40 MG tablet TAKE ONE TABLET (40mg  total) BY MOUTH DAILY AT 9AM 30 tablet 0   potassium chloride  SA (KLOR-CON  M) 20 MEQ tablet Take 1 tablet (20 mEq total) by mouth daily as needed (when taking furosemide  (lasix )). 90 tablet 3   pregabalin (LYRICA) 50 MG capsule Take 50 mg by mouth 3 (three) times daily.     rosuvastatin  (CRESTOR ) 40 MG tablet Take 1 tablet (40 mg total) by mouth daily. 90 tablet 3   No current facility-administered medications on file prior to visit.   Health Maintenance  Topic Date Due   Eye exam for diabetics  07/25/2018   COVID-19 Vaccine (3 - Pfizer risk series) 03/04/2020   Mammogram  01/03/2023   Zoster (Shingles) Vaccine (2 of 2) 06/27/2023   Flu Shot  01/24/2024   Yearly kidney health urinalysis for diabetes  05/01/2024   Medicare Annual Wellness Visit  05/01/2024   Hemoglobin A1C  05/10/2024   Complete foot exam   06/11/2024   DEXA scan (bone density measurement)  11/12/2024   Yearly kidney function blood test for diabetes  12/04/2024   DTaP/Tdap/Td vaccine (2 - Td or Tdap) 04/26/2027   Colon Cancer Screening  02/13/2028   Pneumococcal Vaccine for age over 73  Completed   Hepatitis C Screening  Completed   HPV Vaccine  Aged Out   Meningitis B Vaccine  Aged Out    Objective:   Vitals:   12/10/23 0856  BP: 121/81  Pulse: 70  Resp: 16  SpO2: 95%  Weight: 106 lb (48.1 kg)     Physical Exam Vitals reviewed.  Constitutional:      Appearance: Normal appearance.  HENT:     Head: Normocephalic.     Right Ear: Tympanic membrane, ear canal and external ear normal.     Left Ear: Tympanic membrane, ear canal and external ear normal.     Nose: Nose normal.     Mouth/Throat:     Mouth: Mucous membranes are moist.   Eyes:     Extraocular Movements: Extraocular movements  intact.     Pupils: Pupils are equal, round, and reactive to light.    Cardiovascular:     Rate and Rhythm: Normal rate.  Pulmonary:     Effort: Pulmonary effort is normal.     Breath sounds: Normal breath sounds.  Abdominal:  General: Bowel sounds are normal.     Palpations: Abdomen is soft.   Musculoskeletal:        General: Normal range of motion.     Cervical back: Normal range of motion.   Skin:    General: Skin is warm and dry.   Neurological:     Mental Status: She is alert and oriented to person, place, and time.   Psychiatric:        Mood and Affect: Mood normal.        Behavior: Behavior normal.        Thought Content: Thought content normal.    Assessment & Plan   [name] was seen today for arm pain.  Diagnoses and all orders for this visit:  Chronic pain of both shoulders Agreement made with patient after cardiac rehab if continue to have problems she will be referred to orthopedics     Patient have been counseled extensively about nutrition and exercise. Other issues discussed during this visit include: low cholesterol diet, weight control and daily exercise, foot care, annual eye examinations at Ophthalmology, importance of adherence with medications and regular follow-up. We also discussed long term complications of uncontrolled diabetes and hypertension.   No follow-ups on file.  The patient was given clear instructions to go to ER or return to medical center if symptoms don't improve, worsen or new problems develop. The patient verbalized understanding. The patient was told to call to get lab results if they haven't heard anything in the next week.   This note has been created with Education officer, environmental. Any transcriptional errors are unintentional.   Marius Siemens, NP 12/10/2023, 9:11 AM

## 2023-12-10 NOTE — Progress Notes (Signed)
 Cardiac Individual Treatment Plan  Patient Details  Name: [name] MRN: [id number] Date of Birth: [birth year] Referring Provider:   Flowsheet Row INTENSIVE CARDIAC REHAB ORIENT from 11/28/2023 in [facility]  Referring Provider Dr. 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  Patient's Home Medications on Admission:  Current Outpatient Medications:    Accu-Chek Softclix Lancets lancets, Use to check blood sugar once daily., Disp: 100 each, Rfl: 2   aspirin  EC 81 MG tablet, Take 1 tablet (81 mg total) by mouth daily., Disp: , Rfl:    Blood Glucose Monitoring Suppl (ACCU-CHEK GUIDE) w/Device KIT, Use to check blood sugar once daily., Disp: 1 kit, Rfl: 0   clopidogrel  (PLAVIX ) 75 MG tablet, Take 1 tablet (75 mg total) by mouth daily., Disp: 90 tablet, Rfl: 3   dapagliflozin  propanediol (FARXIGA ) 10 MG TABS tablet, Take 1 tablet (10 mg total) by mouth daily before breakfast., Disp: 90 tablet, Rfl: 1   dicyclomine  (BENTYL ) 10 MG capsule, Take 1 capsule (10 mg total) by mouth 3 (three) times daily before meals., Disp: 90 capsule, Rfl: 11   doxycycline  (VIBRAMYCIN ) 100 MG capsule, Take 1 capsule (100 mg total) by mouth 2 (two) times daily., Disp: 20 capsule, Rfl: 0   DULoxetine  (CYMBALTA ) 30 MG capsule, Take 1 capsule (30 mg total) by mouth daily., Disp: 180 capsule, Rfl: 1   Evolocumab  (REPATHA  SURECLICK) 140 MG/ML SOAJ, Inject 140 mg into the skin every 14 (fourteen) days., Disp: 6 mL, Rfl: 3   ezetimibe  (ZETIA ) 10 MG tablet, Take 1 tablet (10 mg total) by mouth daily., Disp: 90 tablet, Rfl: 3   fluconazole  (DIFLUCAN ) 150 MG tablet, Take 1 tablet (150 mg total) by mouth daily., Disp: 1 tablet, Rfl: 1   furosemide  (LASIX ) 20 MG tablet, Take 1  tablet (20 mg total) by mouth daily as needed., Disp: 90 tablet, Rfl: 3   glucose blood (ACCU-CHEK GUIDE TEST) test strip, Use to check blood sugar once daily., Disp: 100 each, Rfl: 2   hydrOXYzine  (ATARAX ) 25 MG tablet, Take 1 tablet (25 mg total) by mouth every 6 (six) hours., Disp: 12 tablet, Rfl: 0   JANUVIA  25 MG tablet, Take 25 mg by mouth daily., Disp: , Rfl:    losartan -hydrochlorothiazide  (HYZAAR ) 100-25 MG tablet, Take 1 tablet by mouth daily., Disp: 90 tablet, Rfl: 3   methocarbamol  (ROBAXIN ) 500 MG tablet, Take 1 tablet (500 mg total) by mouth every 8 (eight) hours as needed for muscle spasms., Disp: 20 tablet, Rfl: 0   methylPREDNISolone  (MEDROL ) 4 MG tablet, To finish hospital course: 07/10/23 Take 1 tablet at supper, and 2 tabs at bedtime. on 07/11/23, take 1 tablet at breakfast, lunch, supper and bedtime (4 doses). On 1/17 take 1 tab at breakfast, lunch and bedtime. (3 doses) On 1/18, take 1 tab at breakfast and bedtime (2 doses), then on 1/19 take 1 tab at breakfast., Disp: 13 tablet, Rfl: 0   metoprolol  tartrate (LOPRESSOR ) 100 MG tablet, TAKE 1 TABLET IN THE AM AND 1.5 TABLETS IN THE PM, Disp: 225 tablet, Rfl: 3   pantoprazole  (PROTONIX ) 40 MG tablet, TAKE ONE TABLET (40mg  total) BY MOUTH DAILY AT 9AM, Disp: 30 tablet, Rfl: 0   potassium chloride  SA (KLOR-CON  M) 20  MEQ tablet, Take 1 tablet (20 mEq total) by mouth daily as needed (when taking furosemide  (lasix ))., Disp: 90 tablet, Rfl: 3   pregabalin (LYRICA) 50 MG capsule, Take 50 mg by mouth 3 (three) times daily., Disp: , Rfl:    rosuvastatin  (CRESTOR ) 40 MG tablet, Take 1 tablet (40 mg total) by mouth daily., Disp: 90 tablet, Rfl: 3  Past Medical History: Past Medical History:  Diagnosis Date   Anemia    hx   Aneurysm (HCC)    small, on left side of brain    Anxiety    hx of   Arthritis    generalized   CAD (coronary artery disease)    Diabetes mellitus without complication (HCC)    diet control, pt denies- on  medications   GERD (gastroesophageal reflux disease)    iwht certain foods/on meds/OTC PRN meds   Heart failure with mildly reduced ejection fraction (HFmrEF) (HCC)    Hyperlipidemia    on meds   Hypertension    on meds   TIA (transient ischemic attack)    pt unaware of this hx on 06/19/2017    Tobacco Use: Social History   Tobacco Use  Smoking Status Former   Types: Cigarettes  Smokeless Tobacco Never    Labs: Review Flowsheet  More data exists      Latest Ref Rng & Units 05/03/2023 07/02/2023 07/03/2023 07/25/2023 11/08/2023  Labs for ITP Cardiac and Pulmonary Rehab  Cholestrol 100 - 199 mg/dL 811  - - 914  -  LDL (calc) 0 - 99 mg/dL 782  - - 956  -  HDL-C >39 mg/dL 64  - - 59  -  Trlycerides 0 - 149 mg/dL 213  - - 086  -  Hemoglobin A1c 0.0 - 7.0 % - - 6.2  - 7.3   PH, Arterial 7.35 - 7.45 - 7.47  7.321  7.318  7.429  7.453  7.414  7.390  7.458  7.420  - -  PCO2 arterial 32 - 48 mmHg - 32  46.7  46.4  38.1  36.7  40.8  33.2  30.2  27.1  - -  Bicarbonate 20.0 - 28.0 mmol/L - 23.3  24.1  23.8  25.6  25.7  26.1  20.1  22.9  21.4  17.6  - -  TCO2 22 - 32 mmol/L - - 26  25  27  27  28  27   37  21  24  22  24  20  18   - -  Acid-base deficit 0.0 - 2.0 mmol/L - - 2.0  2.0  5.0  2.0  2.0  6.0  - -  O2 Saturation % - 99.2  99  99  99  100  100  100  86  100  100  - -    Details       Multiple values from one day are sorted in reverse-chronological order         Capillary Blood Glucose: Lab Results  Component Value Date   GLUCAP 147 (H) 12/04/2023   GLUCAP 193 (H) 12/04/2023   GLUCAP 143 (H) 12/02/2023   GLUCAP 199 (H) 12/02/2023   GLUCAP 148 (H) 11/28/2023     Exercise Target Goals: Exercise Program Goal: Individual exercise prescription set using results from initial 6 min walk test and THRR while considering  patient's activity barriers and safety.   Exercise Prescription Goal: Initial exercise prescription builds to 30-45 minutes a day of aerobic activity, 2-3  days per week.  Home exercise guidelines will be given to patient during program as part of exercise prescription that the participant will acknowledge.  Activity Barriers & Risk Stratification:  Activity Barriers & Cardiac Risk Stratification - 11/28/23 1209       Activity Barriers & Cardiac Risk Stratification   Activity Barriers Deconditioning;Shortness of Breath;Decreased Ventricular Function;Balance Concerns;Neck/Spine Problems    Cardiac Risk Stratification High          6 Minute Walk:  6 Minute Walk     Row Name 11/28/23 1202         6 Minute Walk   Phase Initial     Distance 890 feet     Walk Time 6 minutes     # of Rest Breaks 1     MPH 1.69     METS 2.31     RPE 9     Perceived Dyspnea  1     VO2 Peak 8.08     Symptoms Yes (comment)     Comments standing rest break about 1/2 way about 30 seconds due to SOB and mild dizziness. Both resolved with the break and did not return     Resting HR 72 bpm     Resting BP 110/66     Resting Oxygen Saturation  98 %     Exercise Oxygen Saturation  during 6 min walk 96 %     Max Ex. HR 90 bpm     Max Ex. BP 120/80     2 Minute Post BP 110/78        Oxygen Initial Assessment:   Oxygen Re-Evaluation:   Oxygen Discharge (Final Oxygen Re-Evaluation):   Initial Exercise Prescription:  Initial Exercise Prescription - 11/28/23 1200       Date of Initial Exercise RX and Referring Provider   Date 11/28/23    Referring Provider Dr. Ahmad Alert MD    Expected Discharge Date 02/17/24      NuStep   Level 1    SPM 70    Minutes 30    METs 1.8      Prescription Details   Frequency (times per week) 3    Duration Progress to 30 minutes of continuous aerobic without signs/symptoms of physical distress      Intensity   THRR 40-80% of Max Heartrate 60-121    Ratings of Perceived Exertion 11-13    Perceived Dyspnea 0-4      Progression   Progression Continue progressive overload as per policy without signs/symptoms  or physical distress.      Resistance Training   Training Prescription Yes    Weight 2    Reps 10-15          Perform Capillary Blood Glucose checks as needed.  Exercise Prescription Changes:   Exercise Prescription Changes     Row Name 12/02/23 1037             Response to Exercise   Blood Pressure (Admit) 96/60       Blood Pressure (Exercise) 102/60       Blood Pressure (Exit) 113/77       Heart Rate (Admit) 84 bpm       Heart Rate (Exercise) 106 bpm       Heart Rate (Exit) 79 bpm       Rating of Perceived Exertion (Exercise) 13       Symptoms None       Comments Off to a good start with  exercise.       Duration Continue with 30 min of aerobic exercise without signs/symptoms of physical distress.       Intensity THRR unchanged         Progression   Progression Continue to progress workloads to maintain intensity without signs/symptoms of physical distress.       Average METs 2.3         Resistance Training   Training Prescription Yes       Weight 2 lbs       Reps 10-15       Time 10 Minutes         Interval Training   Interval Training No         NuStep   Level 1       SPM 81       Minutes 30       METs 2.3          Exercise Comments:   Exercise Comments     Row Name 12/02/23 1136 12/06/23 1105         Exercise Comments Cathy tolerated low intensity exercise well without symptoms. Oriented her to the exercise equipment and stretching routine. Reviewed METs with Claryce.         Exercise Goals and Review:   Exercise Goals     Row Name 11/28/23 1211             Exercise Goals   Increase Physical Activity Yes       Intervention Provide advice, education, support and counseling about physical activity/exercise needs.;Develop an individualized exercise prescription for aerobic and resistive training based on initial evaluation findings, risk stratification, comorbidities and participant's personal goals.       Expected Outcomes Short  Term: Attend rehab on a regular basis to increase amount of physical activity.;Long Term: Exercising regularly at least 3-5 days a week.;Long Term: Add in home exercise to make exercise part of routine and to increase amount of physical activity.       Increase Strength and Stamina Yes       Intervention Provide advice, education, support and counseling about physical activity/exercise needs.;Develop an individualized exercise prescription for aerobic and resistive training based on initial evaluation findings, risk stratification, comorbidities and participant's personal goals.       Expected Outcomes Short Term: Increase workloads from initial exercise prescription for resistance, speed, and METs.;Long Term: Improve cardiorespiratory fitness, muscular endurance and strength as measured by increased METs and functional capacity ( );Short Term: Perform resistance training exercises routinely during rehab and add in resistance training at home       Able to understand and use rate of perceived exertion (RPE) scale Yes       Intervention Provide education and explanation on how to use RPE scale       Expected Outcomes Short Term: Able to use RPE daily in rehab to express subjective intensity level;Long Term:  Able to use RPE to guide intensity level when exercising independently       Knowledge and understanding of Target Heart Rate Range (THRR) Yes       Intervention Provide education and explanation of THRR including how the numbers were predicted and where they are located for reference       Expected Outcomes Short Term: Able to state/look up THRR;Long Term: Able to use THRR to govern intensity when exercising independently;Short Term: Able to use daily as guideline for intensity in rehab  Understanding of Exercise Prescription Yes       Intervention Provide education, explanation, and written materials on patient's individual exercise prescription       Expected Outcomes Short Term: Able to  explain program exercise prescription;Long Term: Able to explain home exercise prescription to exercise independently          Exercise Goals Re-Evaluation :  Exercise Goals Re-Evaluation     Row Name 12/02/23 1136             Exercise Goal Re-Evaluation   Exercise Goals Review Increase Physical Activity;Increase Strength and Stamina;Able to understand and use rate of perceived exertion (RPE) scale       Comments Loida was able to understand and use RPE scale appropriately.       Expected Outcomes Progress workloads as tolerated to help improve cardiorespiratory fitness.          Discharge Exercise Prescription (Final Exercise Prescription Changes):  Exercise Prescription Changes - 12/02/23 1037       Response to Exercise   Blood Pressure (Admit) 96/60    Blood Pressure (Exercise) 102/60    Blood Pressure (Exit) 113/77    Heart Rate (Admit) 84 bpm    Heart Rate (Exercise) 106 bpm    Heart Rate (Exit) 79 bpm    Rating of Perceived Exertion (Exercise) 13    Symptoms None    Comments Off to a good start with exercise.    Duration Continue with 30 min of aerobic exercise without signs/symptoms of physical distress.    Intensity THRR unchanged      Progression   Progression Continue to progress workloads to maintain intensity without signs/symptoms of physical distress.    Average METs 2.3      Resistance Training   Training Prescription Yes    Weight 2 lbs    Reps 10-15    Time 10 Minutes      Interval Training   Interval Training No      NuStep   Level 1    SPM 81    Minutes 30    METs 2.3          Nutrition:  Target Goals: Understanding of nutrition guidelines, daily intake of sodium 1500mg , cholesterol 200mg , calories 30% from fat and 7% or less from saturated fats, daily to have 5 or more servings of fruits and vegetables.  Biometrics:  Pre Biometrics - 11/28/23 1200       Pre Biometrics   Height 5' 1 (1.549 m)    Waist Circumference 29.8  inches    Hip Circumference 32.5 inches    Waist to Hip Ratio 0.92 %    Triceps Skinfold 9 mm    % Body Fat 27.3 %    Grip Strength 12 kg    Flexibility 13.5 in    Single Leg Stand 3 seconds           Nutrition Therapy Plan and Nutrition Goals:  Nutrition Therapy & Goals - 12/02/23 1102       Nutrition Therapy   Diet Heart healthy/Carbohydrat consistent diet    Drug/Food Interactions Statins/Certain Fruits      Personal Nutrition Goals   Nutrition Goal Patient to identify strategies for reducing cardiovascular risk by attending the [employer] education and nutrition series weekly.    Personal Goal #2 Patient to improve diet quality by using the plate method as a guide for meal planning to include lean protein/plant protein, fruits, vegetables, whole grains, nonfat dairy as part  of a well-balanced diet.    Personal Goal #3 Patient to identify strategies for improving blood sugar with goal <7% A1c.    Comments Duha has medical history of CABGx2, HFrEF, HTN, Hyperlipidemia, TIA, DM2. A1c is not at goal. LDL is not at goal (treated with repatha , zetia , crestor ). Patient will benefit from participation in intensive cardiac rehab for nutrition education, exercise, and lifestyle modification.      Intervention Plan   Intervention Prescribe, educate and counsel regarding individualized specific dietary modifications aiming towards targeted core components such as weight, hypertension, lipid management, diabetes, heart failure and other comorbidities.;Nutrition handout(s) given to patient.    Expected Outcomes Short Term Goal: Understand basic principles of dietary content, such as calories, fat, sodium, cholesterol and nutrients.;Long Term Goal: Adherence to prescribed nutrition plan.          Nutrition Assessments:  MEDIFICTS Score Key: >=70 Need to make dietary changes  40-70 Heart Healthy Diet <= 40 Therapeutic Level Cholesterol Diet    Picture Your Plate Scores: <82  Unhealthy dietary pattern with much room for improvement. 41-50 Dietary pattern unlikely to meet recommendations for good health and room for improvement. 51-60 More healthful dietary pattern, with some room for improvement.  >60 Healthy dietary pattern, although there may be some specific behaviors that could be improved.    Nutrition Goals Re-Evaluation:  Nutrition Goals Re-Evaluation     Row Name 12/02/23 1102             Goals   Current Weight 106 lb 0.7 oz (48.1 kg)       Comment A1c 7.3, cholesterol 251, triglycerides 193, LDL 157. BMI is low/normal for age.       Expected Outcome Eulala has medical history of CABGx2, HFrEF, HTN, Hyperlipidemia, TIA, DM2. A1c is not at goal. LDL is not at goal (treated with repatha , zetia , crestor ). Patient will benefit from participation in intensive cardiac rehab for nutrition education, exercise, and lifestyle modification.          Nutrition Goals Re-Evaluation:  Nutrition Goals Re-Evaluation     Row Name 12/02/23 1102             Goals   Current Weight 106 lb 0.7 oz (48.1 kg)       Comment A1c 7.3, cholesterol 251, triglycerides 193, LDL 157. BMI is low/normal for age.       Expected Outcome Lysha has medical history of CABGx2, HFrEF, HTN, Hyperlipidemia, TIA, DM2. A1c is not at goal. LDL is not at goal (treated with repatha , zetia , crestor ). Patient will benefit from participation in intensive cardiac rehab for nutrition education, exercise, and lifestyle modification.          Nutrition Goals Discharge (Final Nutrition Goals Re-Evaluation):  Nutrition Goals Re-Evaluation - 12/02/23 1102       Goals   Current Weight 106 lb 0.7 oz (48.1 kg)    Comment A1c 7.3, cholesterol 251, triglycerides 193, LDL 157. BMI is low/normal for age.    Expected Outcome Alaija has medical history of CABGx2, HFrEF, HTN, Hyperlipidemia, TIA, DM2. A1c is not at goal. LDL is not at goal (treated with repatha , zetia , crestor ). Patient will benefit  from participation in intensive cardiac rehab for nutrition education, exercise, and lifestyle modification.          Psychosocial: Target Goals: Acknowledge presence or absence of significant depression and/or stress, maximize coping skills, provide positive support system. Participant is able to verbalize types and ability to use techniques and skills  needed for reducing stress and depression.  Initial Review & Psychosocial Screening:  Initial Psych Review & Screening - 11/28/23 1212       Initial Review   Current issues with Current Anxiety/Panic;Current Stress Concerns    Source of Stress Concerns Family;Financial    Comments Pt does have some mild stress concerns, but has good family support with her niece, sister, and daughter. She feels her stress is controlled and has no additional needs at this time      Family Dynamics   Good Support System? Yes    Concerns Recent loss of significant other    Comments Mother and brother recently      Barriers   Psychosocial barriers to participate in program The patient should benefit from training in stress management and relaxation.      Screening Interventions   Interventions Encouraged to exercise;To provide support and resources with identified psychosocial needs;Provide feedback about the scores to participant    Expected Outcomes Short Term goal: Utilizing psychosocial counselor, staff and physician to assist with identification of specific Stressors or current issues interfering with healing process. Setting desired goal for each stressor or current issue identified.;Long Term Goal: Stressors or current issues are controlled or eliminated.;Short Term goal: Identification and review with participant of any Quality of Life or Depression concerns found by scoring the questionnaire.;Long Term goal: The participant improves quality of Life and PHQ9 Scores as seen by post scores and/or verbalization of changes          Quality of Life  Scores:  Quality of Life - 11/28/23 1216       Quality of Life   Select Quality of Life      Quality of Life Scores   Health/Function Pre 24.92 %    Socioeconomic Pre 25.71 %    Psych/Spiritual Pre 30 %    Family Pre 30 %    GLOBAL Pre 26.9 %         Scores of 19 and below usually indicate a poorer quality of life in these areas.  A difference of  2-3 points is a clinically meaningful difference.  A difference of 2-3 points in the total score of the Quality of Life Index has been associated with significant improvement in overall quality of life, self-image, physical symptoms, and general health in studies assessing change in quality of life.  PHQ-9: Review Flowsheet  More data exists      11/28/2023 11/08/2023 05/02/2023 12/10/2022 07/26/2022  Depression screen PHQ 2/9  Decreased Interest 0 0 0 0 0 0  Down, Depressed, Hopeless 0 0 1 0 0 0  PHQ - 2 Score 0 0 1 0 0 0  Altered sleeping 1 - 0 0 - -  Tired, decreased energy 1 - 1 0 - -  Change in appetite 1 - 0 1 - -  Feeling bad or failure about yourself  0 - 0 0 - -  Trouble concentrating 0 - 0 0 - -  Moving slowly or fidgety/restless 0 - 0 0 - -  Suicidal thoughts 0 - 0 0 - -  PHQ-9 Score 3 - 2 1 - -  Difficult doing work/chores Not difficult at all - - - -    Details       Multiple values from one day are sorted in reverse-chronological order        Interpretation of Total Score  Total Score Depression Severity:  1-4 = Minimal depression, 5-9 = Mild depression, 10-14 =  Moderate depression, 15-19 = Moderately severe depression, 20-27 = Severe depression   Psychosocial Evaluation and Intervention:   Psychosocial Re-Evaluation:  Psychosocial Re-Evaluation     Row Name 12/02/23 1725 12/06/23 0930           Psychosocial Re-Evaluation   Current issues with Current Anxiety/Panic;Current Stress Concerns Current Anxiety/Panic;Current Stress Concerns      Comments Zionah did not voice any increased concerns or stressors  on her first day of exercise. 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      Expected Outcomes Shelli will have controlled or decreased stressors upon completion of cardiac rehab Arlynn will have controlled or decreased stressors upon completion of cardiac rehab      Interventions Stress management education;Relaxation education;Encouraged to attend Cardiac Rehabilitation for the exercise Stress management education;Relaxation education;Encouraged to attend Cardiac Rehabilitation for the exercise      Continue Psychosocial Services  Follow up required by staff Follow up required by staff        Initial Review   Source of Stress Concerns Chronic Illness;Financial;Family Chronic Illness;Financial;Family      Comments Will continue to monitor and offer support as needed. Will continue to monitor and offer support as needed.         Psychosocial Discharge (Final Psychosocial Re-Evaluation):  Psychosocial Re-Evaluation - 12/06/23 0930       Psychosocial Re-Evaluation   Current issues with Current Anxiety/Panic;Current Stress Concerns    Comments Amoreena has not voiced any increased concerns or stressors on her first week of exercise.    Expected Outcomes Shriya will have controlled or decreased stressors upon completion of cardiac rehab    Interventions Stress management education;Relaxation education;Encouraged to attend Cardiac Rehabilitation for the exercise    Continue Psychosocial Services  Follow up required by staff      Initial Review   Source of Stress Concerns Chronic Illness;Financial;Family    Comments Will continue to monitor and offer support as needed.          Vocational Rehabilitation: Provide vocational rehab assistance to qualifying candidates.   Vocational Rehab Evaluation & Intervention:  Vocational Rehab - 11/28/23 1222       Initial Vocational Rehab Evaluation  & Intervention   Assessment shows need for Vocational Rehabilitation No   She has her return to work papers and has no additional needs at this time         Education: Education Goals: Education classes will be provided on a weekly basis, covering required topics. Participant will state understanding/return demonstration of topics presented.    Education     Row Name 12/02/23 1300     Education   Cardiac Education Topics [employer]   Glass blower/designer Nutrition   Nutrition Workshop Targeting Your Nutrition Priorities   Instruction Review Code 1- Verbalizes Understanding   Class Start Time 1150   Class Stop Time 1230   Class Time Calculation (min) 40 min    Row Name 12/06/23 1200     Education   Cardiac Education Topics [facility]   Psychologist, forensic General Education   General Education Hypertension and Heart Disease   Instruction Review Code 1- Verbalizes Understanding   Class Start Time 1155   Class Stop Time 1232  Class Time Calculation (min) 37 min    Row Name 12/09/23 1200     Education   Cardiac Education Topics [employer]   Geographical information systems officer Psychosocial   Psychosocial Workshop Focused Goals, Sustainable Changes   Instruction Review Code 1- Verbalizes Understanding   Class Start Time 1155   Class Stop Time 1235   Class Time Calculation (min) 40 min      Core Videos: Exercise    Move It!  Clinical staff conducted group or individual video education with verbal and written material and guidebook.  Patient learns the recommended Pritikin exercise program. Exercise with the goal of living a long, healthy life. Some of the health benefits of exercise include controlled diabetes, healthier blood pressure levels, improved cholesterol levels, improved heart and lung capacity, improved sleep, and better  body composition. Everyone should speak with their doctor before starting or changing an exercise routine.  Biomechanical Limitations Clinical staff conducted group or individual video education with verbal and written material and guidebook.  Patient learns how biomechanical limitations can impact exercise and how we can mitigate and possibly overcome limitations to have an impactful and balanced exercise routine.  Body Composition Clinical staff conducted group or individual video education with verbal and written material and guidebook.  Patient learns that body composition (ratio of muscle mass to fat mass) is a key component to assessing overall fitness, rather than body weight alone. Increased fat mass, especially visceral belly fat, can put us  at increased risk for metabolic syndrome, type 2 diabetes, heart disease, and even death. It is recommended to combine diet and exercise (cardiovascular and resistance training) to improve your body composition. Seek guidance from your physician and exercise physiologist before implementing an exercise routine.  Exercise Action Plan Clinical staff conducted group or individual video education with verbal and written material and guidebook.  Patient learns the recommended strategies to achieve and enjoy long-term exercise adherence, including variety, self-motivation, self-efficacy, and positive decision making. Benefits of exercise include fitness, good health, weight management, more energy, better sleep, less stress, and overall well-being.  Medical   Heart Disease Risk Reduction Clinical staff conducted group or individual video education with verbal and written material and guidebook.  Patient learns our heart is our most vital organ as it circulates oxygen, nutrients, white blood cells, and hormones throughout the entire body, and carries waste away. Data supports a plant-based eating plan like the Pritikin Program for its effectiveness in slowing  progression of and reversing heart disease. The video provides a number of recommendations to address heart disease.   Metabolic Syndrome and Belly Fat  Clinical staff conducted group or individual video education with verbal and written material and guidebook.  Patient learns what metabolic syndrome is, how it leads to heart disease, and how one can reverse it and keep it from coming back. You have metabolic syndrome if you have 3 of the following 5 criteria: abdominal obesity, high blood pressure, high triglycerides, low HDL cholesterol, and high blood sugar.  Hypertension and Heart Disease Clinical staff conducted group or individual video education with verbal and written material and guidebook.  Patient learns that high blood pressure, or hypertension, is very common in the [place] . Hypertension is largely due to excessive salt intake, but other important risk factors include being overweight, physical inactivity, drinking too much alcohol, smoking, and not eating enough potassium from fruits and vegetables. High blood pressure is a leading  risk factor for heart attack, stroke, congestive heart failure, dementia, kidney failure, and premature death. Long-term effects of excessive salt intake include stiffening of the arteries and thickening of heart muscle and organ damage. Recommendations include ways to reduce hypertension and the risk of heart disease.  Diseases of Our Time - Focusing on Diabetes Clinical staff conducted group or individual video education with verbal and written material and guidebook.  Patient learns why the best way to stop diseases of our time is prevention, through food and other lifestyle changes. Medicine (such as prescription pills and surgeries) is often only a Band-Aid on the problem, not a long-term solution. Most common diseases of our time include obesity, type 2 diabetes, hypertension, heart disease, and cancer. The Pritikin Program is recommended and has been  proven to help reduce, reverse, and/or prevent the damaging effects of metabolic syndrome.  Nutrition   Overview of the Pritikin Eating Plan  Clinical staff conducted group or individual video education with verbal and written material and guidebook.  Patient learns about the Pritikin Eating Plan for disease risk reduction. The Pritikin Eating Plan emphasizes a wide variety of unrefined, minimally-processed carbohydrates, like fruits, vegetables, whole grains, and legumes. Go, Caution, and Stop food choices are explained. Plant-based and lean animal proteins are emphasized. Rationale provided for low sodium intake for blood pressure control, low added sugars for blood sugar stabilization, and low added fats and oils for coronary artery disease risk reduction and weight management.  Calorie Density  Clinical staff conducted group or individual video education with verbal and written material and guidebook.  Patient learns about calorie density and how it impacts the Pritikin Eating Plan. Knowing the characteristics of the food you choose will help you decide whether those foods will lead to weight gain or weight loss, and whether you want to consume more or less of them. Weight loss is usually a side effect of the Pritikin Eating Plan because of its focus on low calorie-dense foods.  Label Reading  Clinical staff conducted group or individual video education with verbal and written material and guidebook.  Patient learns about the Pritikin recommended label reading guidelines and corresponding recommendations regarding calorie density, added sugars, sodium content, and whole grains.  Dining Out - Part 1  Clinical staff conducted group or individual video education with verbal and written material and guidebook.  Patient learns that restaurant meals can be sabotaging because they can be so high in calories, fat, sodium, and/or sugar. Patient learns recommended strategies on how to positively address  this and avoid unhealthy pitfalls.  Facts on Fats  Clinical staff conducted group or individual video education with verbal and written material and guidebook.  Patient learns that lifestyle modifications can be just as effective, if not more so, as many medications for lowering your risk of heart disease. A Pritikin lifestyle can help to reduce your risk of inflammation and atherosclerosis (cholesterol build-up, or plaque, in the artery walls). Lifestyle interventions such as dietary choices and physical activity address the cause of atherosclerosis. A review of the types of fats and their impact on blood cholesterol levels, along with dietary recommendations to reduce fat intake is also included.  Nutrition Action Plan  Clinical staff conducted group or individual video education with verbal and written material and guidebook.  Patient learns how to incorporate Pritikin recommendations into their lifestyle. Recommendations include planning and keeping personal health goals in mind as an important part of their success.  Healthy Mind-Set    Healthy Minds,  Bodies, Hearts  Clinical staff conducted group or individual video education with verbal and written material and guidebook.  Patient learns how to identify when they are stressed. Video will discuss the impact of that stress, as well as the many benefits of stress management. Patient will also be introduced to stress management techniques. The way we think, act, and feel has an impact on our hearts.  How Our Thoughts Can Heal Our Hearts  Clinical staff conducted group or individual video education with verbal and written material and guidebook.  Patient learns that negative thoughts can cause depression and anxiety. This can result in negative lifestyle behavior and serious health problems. Cognitive behavioral therapy is an effective method to help control our thoughts in order to change and improve our emotional outlook.  Additional  Videos:  Exercise    Improving Performance  Clinical staff conducted group or individual video education with verbal and written material and guidebook.  Patient learns to use a non-linear approach by alternating intensity levels and lengths of time spent exercising to help burn more calories and lose more body fat. Cardiovascular exercise helps improve heart health, metabolism, hormonal balance, blood sugar control, and recovery from fatigue. Resistance training improves strength, endurance, balance, coordination, reaction time, metabolism, and muscle mass. Flexibility exercise improves circulation, posture, and balance. Seek guidance from your physician and exercise physiologist before implementing an exercise routine and learn your capabilities and proper form for all exercise.  Introduction to Yoga  Clinical staff conducted group or individual video education with verbal and written material and guidebook.  Patient learns about yoga, a discipline of the coming together of mind, breath, and body. The benefits of yoga include improved flexibility, improved range of motion, better posture and core strength, increased lung function, weight loss, and positive self-image. Yoga's heart health benefits include lowered blood pressure, healthier heart rate, decreased cholesterol and triglyceride levels, improved immune function, and reduced stress. Seek guidance from your physician and exercise physiologist before implementing an exercise routine and learn your capabilities and proper form for all exercise.  Medical   Aging: Enhancing Your Quality of Life  Clinical staff conducted group or individual video education with verbal and written material and guidebook.  Patient learns key strategies and recommendations to stay in good physical health and enhance quality of life, such as prevention strategies, having an advocate, securing a Health Care Proxy and Power of Attorney, and keeping a list of medications  and system for tracking them. It also discusses how to avoid risk for bone loss.  Biology of Weight Control  Clinical staff conducted group or individual video education with verbal and written material and guidebook.  Patient learns that weight gain occurs because we consume more calories than we burn (eating more, moving less). Even if your body weight is normal, you may have higher ratios of fat compared to muscle mass. Too much body fat puts you at increased risk for cardiovascular disease, heart attack, stroke, type 2 diabetes, and obesity-related cancers. In addition to exercise, following the Pritikin Eating Plan can help reduce your risk.  Decoding Lab Results  Clinical staff conducted group or individual video education with verbal and written material and guidebook.  Patient learns that lab test reflects one measurement whose values change over time and are influenced by many factors, including medication, stress, sleep, exercise, food, hydration, pre-existing medical conditions, and more. It is recommended to use the knowledge from this video to become more involved with your lab results and evaluate  your numbers to speak with your doctor.   Diseases of Our Time - Overview  Clinical staff conducted group or individual video education with verbal and written material and guidebook.  Patient learns that according to the CDC, 50% to 70% of chronic diseases (such as obesity, type 2 diabetes, elevated lipids, hypertension, and heart disease) are avoidable through lifestyle improvements including healthier food choices, listening to satiety cues, and increased physical activity.  Sleep Disorders Clinical staff conducted group or individual video education with verbal and written material and guidebook.  Patient learns how good quality and duration of sleep are important to overall health and well-being. Patient also learns about sleep disorders and how they impact health along with  recommendations to address them, including discussing with a physician.  Nutrition  Dining Out - Part 2 Clinical staff conducted group or individual video education with verbal and written material and guidebook.  Patient learns how to plan ahead and communicate in order to maximize their dining experience in a healthy and nutritious manner. Included are recommended food choices based on the type of restaurant the patient is visiting.   Fueling a Banker conducted group or individual video education with verbal and written material and guidebook.  There is a strong connection between our food choices and our health. Diseases like obesity and type 2 diabetes are very prevalent and are in large-part due to lifestyle choices. The Pritikin Eating Plan provides plenty of food and hunger-curbing satisfaction. It is easy to follow, affordable, and helps reduce health risks.  Menu Workshop  Clinical staff conducted group or individual video education with verbal and written material and guidebook.  Patient learns that restaurant meals can sabotage health goals because they are often packed with calories, fat, sodium, and sugar. Recommendations include strategies to plan ahead and to communicate with the manager, chef, or server to help order a healthier meal.  Planning Your Eating Strategy  Clinical staff conducted group or individual video education with verbal and written material and guidebook.  Patient learns about the Pritikin Eating Plan and its benefit of reducing the risk of disease. The Pritikin Eating Plan does not focus on calories. Instead, it emphasizes high-quality, nutrient-rich foods. By knowing the characteristics of the foods, we choose, we can determine their calorie density and make informed decisions.  Targeting Your Nutrition Priorities  Clinical staff conducted group or individual video education with verbal and written material and guidebook.  Patient learns  that lifestyle habits have a tremendous impact on disease risk and progression. This video provides eating and physical activity recommendations based on your personal health goals, such as reducing LDL cholesterol, losing weight, preventing or controlling type 2 diabetes, and reducing high blood pressure.  Vitamins and Minerals  Clinical staff conducted group or individual video education with verbal and written material and guidebook.  Patient learns different ways to obtain key vitamins and minerals, including through a recommended healthy diet. It is important to discuss all supplements you take with your doctor.   Healthy Mind-Set    Smoking Cessation  Clinical staff conducted group or individual video education with verbal and written material and guidebook.  Patient learns that cigarette smoking and tobacco addiction pose a serious health risk which affects millions of people. Stopping smoking will significantly reduce the risk of heart disease, lung disease, and many forms of cancer. Recommended strategies for quitting are covered, including working with your doctor to develop a successful plan.  Culinary   Becoming  a [place]rit[place]in [place] conducted group or individual video education with verbal and written material and guidebook.  Patient learns that cooking at home can be healthy, cost-effective, quick, and puts them in control. Keys to cooking healthy recipes will include looking at your recipe, assessing your equipment needs, planning ahead, making it simple, choosing cost-effective seasonal ingredients, and limiting the use of added fats, salts, and sugars.  Cooking - Breakfast and Snacks  Clinical staff conducted group or individual video education with verbal and written material and guidebook.  Patient learns how important breakfast is to satiety and nutrition through the entire day. Recommendations include key foods to eat during breakfast to help stabilize blood sugar  levels and to prevent overeating at meals later in the day. Planning ahead is also a key component.  Cooking - Educational psychologist conducted group or individual video education with verbal and written material and guidebook.  Patient learns eating strategies to improve overall health, including an approach to cook more at home. Recommendations include thinking of animal protein as a side on your plate rather than center stage and focusing instead on lower calorie dense options like vegetables, fruits, whole grains, and plant-based proteins, such as beans. Making sauces in large quantities to freeze for later and leaving the skin on your vegetables are also recommended to maximize your experience.  Cooking - Healthy Salads and Dressing Clinical staff conducted group or individual video education with verbal and written material and guidebook.  Patient learns that vegetables, fruits, whole grains, and legumes are the foundations of the Pritikin Eating Plan. Recommendations include how to incorporate each of these in flavorful and healthy salads, and how to create homemade salad dressings. Proper handling of ingredients is also covered. Cooking - Soups and [employer] - Soups and Desserts Clinical staff conducted group or individual video education with verbal and written material and guidebook.  Patient learns that Pritikin soups and desserts make for easy, nutritious, and delicious snacks and meal components that are low in sodium, fat, sugar, and calorie density, while high in vitamins, minerals, and filling fiber. Recommendations include simple and healthy ideas for soups and desserts.   Overview     The Pritikin Solution Program Overview Clinical staff conducted group or individual video education with verbal and written material and guidebook.  Patient learns that the results of the Pritikin Program have been documented in more than 100 articles published in peer-reviewed  journals, and the benefits include reducing risk factors for (and, in some cases, even reversing) high cholesterol, high blood pressure, type 2 diabetes, obesity, and more! An overview of the three key pillars of the Pritikin Program will be covered: eating well, doing regular exercise, and having a healthy mind-set.  WORKSHOPS  Exercise: Exercise Basics: Building Your Action Plan Clinical staff led group instruction and group discussion with PowerPoint presentation and patient guidebook. To enhance the learning environment the use of posters, models and videos may be added. At the conclusion of this workshop, patients will comprehend the difference between physical activity and exercise, as well as the benefits of incorporating both, into their routine. Patients will understand the FITT (Frequency, Intensity, Time, and Type) principle and how to use it to build an exercise action plan. In addition, safety concerns and other considerations for exercise and cardiac rehab will be addressed by the presenter. The purpose of this lesson is to promote a comprehensive and effective weekly exercise routine in order to improve patients'  overall level of fitness.   Managing Heart Disease: Your Path to a Healthier Heart Clinical staff led group instruction and group discussion with PowerPoint presentation and patient guidebook. To enhance the learning environment the use of posters, models and videos may be added.At the conclusion of this workshop, patients will understand the anatomy and physiology of the heart. Additionally, they will understand how Pritikin's three pillars impact the risk factors, the progression, and the management of heart disease.  The purpose of this lesson is to provide a high-level overview of the heart, heart disease, and how the Pritikin lifestyle positively impacts risk factors.  Exercise Biomechanics Clinical staff led group instruction and group discussion with PowerPoint  presentation and patient guidebook. To enhance the learning environment the use of posters, models and videos may be added. Patients will learn how the structural parts of their bodies function and how these functions impact their daily activities, movement, and exercise. Patients will learn how to promote a neutral spine, learn how to manage pain, and identify ways to improve their physical movement in order to promote healthy living. The purpose of this lesson is to expose patients to common physical limitations that impact physical activity. Participants will learn practical ways to adapt and manage aches and pains, and to minimize their effect on regular exercise. Patients will learn how to maintain good posture while sitting, walking, and lifting.  Balance Training and Fall Prevention  Clinical staff led group instruction and group discussion with PowerPoint presentation and patient guidebook. To enhance the learning environment the use of posters, models and videos may be added. At the conclusion of this workshop, patients will understand the importance of their sensorimotor skills (vision, proprioception, and the vestibular system) in maintaining their ability to balance as they age. Patients will apply a variety of balancing exercises that are appropriate for their current level of function. Patients will understand the common causes for poor balance, possible solutions to these problems, and ways to modify their physical environment in order to minimize their fall risk. The purpose of this lesson is to teach patients about the importance of maintaining balance as they age and ways to minimize their risk of falling.  WORKSHOPS   Nutrition:  Fueling a Ship broker led group instruction and group discussion with PowerPoint presentation and patient guidebook. To enhance the learning environment the use of posters, models and videos may be added. Patients will review the  foundational principles of the Pritikin Eating Plan and understand what constitutes a serving size in each of the food groups. Patients will also learn Pritikin-friendly foods that are better choices when away from home and review make-ahead meal and snack options. Calorie density will be reviewed and applied to three nutrition priorities: weight maintenance, weight loss, and weight gain. The purpose of this lesson is to reinforce (in a group setting) the key concepts around what patients are recommended to eat and how to apply these guidelines when away from home by planning and selecting Pritikin-friendly options. Patients will understand how calorie density may be adjusted for different weight management goals.  Mindful Eating  Clinical staff led group instruction and group discussion with PowerPoint presentation and patient guidebook. To enhance the learning environment the use of posters, models and videos may be added. Patients will briefly review the concepts of the Pritikin Eating Plan and the importance of low-calorie dense foods. The concept of mindful eating will be introduced as well as the importance of paying attention to internal hunger  signals. Triggers for non-hunger eating and techniques for dealing with triggers will be explored. The purpose of this lesson is to provide patients with the opportunity to review the basic principles of the Pritikin Eating Plan, discuss the value of eating mindfully and how to measure internal cues of hunger and fullness using the Hunger Scale. Patients will also discuss reasons for non-hunger eating and learn strategies to use for controlling emotional eating.  Targeting Your Nutrition Priorities Clinical staff led group instruction and group discussion with PowerPoint presentation and patient guidebook. To enhance the learning environment the use of posters, models and videos may be added. Patients will learn how to determine their genetic susceptibility to  disease by reviewing their family history. Patients will gain insight into the importance of diet as part of an overall healthy lifestyle in mitigating the impact of genetics and other environmental insults. The purpose of this lesson is to provide patients with the opportunity to assess their personal nutrition priorities by looking at their family history, their own health history and current risk factors. Patients will also be able to discuss ways of prioritizing and modifying the [facility] Eating Plan for their highest risk areas  Menu  Clinical staff led group instruction and group discussion with PowerPoint presentation and patient guidebook. To enhance the learning environment the use of posters, models and videos may be added. Using menus brought in from [employer], or printed from [employer], patients will apply the [facility]in dining out guidelines that were presented in the [employer] video. Patients will also be able to practice these guidelines in a variety of provided scenarios. The purpose of this lesson is to provide patients with the opportunity to practice hands-on learning of the [facility] Dining Out guidelines with actual menus and practice scenarios.  Label Reading Clinical staff led group instruction and group discussion with PowerPoint presentation and patient guidebook. To enhance the learning environment the use of posters, models and videos may be added. Patients will review and discuss the [facility] label reading guidelines presented in [employer]'s Label Reading Educational series video. Using fool labels brought in from local grocery stores and markets, patients will apply the label reading guidelines and determine if the packaged food meet the [facility]ritikin guidelines. The purpose of this lesson is to provide patients with the opportunity to review, discuss, and practice hands-on learning of the [facility] Label Reading guidelines with actual packaged food  labels. Cooking School  [facility]'s [employer] are designed to teach patients ways to prepare quick, simple, and affordable recipes at home. The importance of nutrition's role in chronic disease risk reduction is reflected in its emphasis in the overall [facility] program. By learning how to prepare essential core P[facility] Eating Plan recipes, patients will increase control over what they eat; be able to customize the flavor of foods without the use of added salt, sugar, or fat; and improve the quality of the food they consume. By learning a set of core recipes which are easily assembled, quickly prepared, and affordable, patients are more likely to prepare more healthy foods at home. These workshops focus on convenient breakfasts, simple entres, side dishes, and desserts which can be prepared with minimal effort and are consistent with nutrition recommendations for cardiovascular risk reduction. Cooking [employer] are taught by a Armed forces logistics/support/administrative officer (RD) who has been trained by the [employer]. The chef or RD has a clear understanding of the importance of minimizing - if not completely eliminating -  added fat, sugar, and sodium in recipes. Throughout the series of Cooking School Workshop sessions, patients will learn about healthy ingredients and efficient methods of cooking to build confidence in their capability to prepare    Cooking School weekly topics:  Adding Flavor- Sodium-Free  Fast and Healthy Breakfasts  Powerhouse Plant-Based Proteins  Satisfying Salads and Dressings  Simple Sides and Sauces  International Cuisine-Spotlight on the [employer] Zones  Delicious Desserts  Savory Soups  [employer] - Meals in a Astronomer Appetizers and Snacks  Comforting Weekend Breakfasts  One-Pot Wonders   Fast Evening Meals  Landscape architect Your Pritikin Plate  WORKSHOPS   Healthy Mindset (Psychosocial):  Focused Goals, Sustainable  Changes Clinical staff led group instruction and group discussion with PowerPoint presentation and patient guidebook. To enhance the learning environment the use of posters, models and videos may be added. Patients will be able to apply effective goal setting strategies to establish at least one personal goal, and then take consistent, meaningful action toward that goal. They will learn to identify common barriers to achieving personal goals and develop strategies to overcome them. Patients will also gain an understanding of how our mind-set can impact our ability to achieve goals and the importance of cultivating a positive and growth-oriented mind-set. The purpose of this lesson is to provide patients with a deeper understanding of how to set and achieve personal goals, as well as the tools and strategies needed to overcome common obstacles which may arise along the way.  From Head to Heart: The Power of a Healthy Outlook  Clinical staff led group instruction and group discussion with PowerPoint presentation and patient guidebook. To enhance the learning environment the use of posters, models and videos may be added. Patients will be able to recognize and describe the impact of emotions and mood on physical health. They will discover the importance of self-care and explore self-care practices which may work for them. Patients will also learn how to utilize the 4 C's to cultivate a healthier outlook and better manage stress and challenges. The purpose of this lesson is to demonstrate to patients how a healthy outlook is an essential part of maintaining good health, especially as they continue their cardiac rehab journey.  Healthy Sleep for a Healthy Heart Clinical staff led group instruction and group discussion with PowerPoint presentation and patient guidebook. To enhance the learning environment the use of posters, models and videos may be added. At the conclusion of this workshop, patients will be able  to demonstrate knowledge of the importance of sleep to overall health, well-being, and quality of life. They will understand the symptoms of, and treatments for, common sleep disorders. Patients will also be able to identify daytime and nighttime behaviors which impact sleep, and they will be able to apply these tools to help manage sleep-related challenges. The purpose of this lesson is to provide patients with a general overview of sleep and outline the importance of quality sleep. Patients will learn about a few of the most common sleep disorders. Patients will also be introduced to the concept of "sleep hygiene," and discover ways to self-manage certain sleeping problems through simple daily behavior changes. Finally, the workshop will motivate patients by clarifying the links between quality sleep and their goals of heart-healthy living.   Recognizing and Reducing Stress Clinical staff led group instruction and group discussion with PowerPoint presentation and patient guidebook. To enhance the learning environment the use of posters, models and videos may be  added. At the conclusion of this workshop, patients will be able to understand the types of stress reactions, differentiate between acute and chronic stress, and recognize the impact that chronic stress has on their health. They will also be able to apply different coping mechanisms, such as reframing negative self-talk. Patients will have the opportunity to practice a variety of stress management techniques, such as deep abdominal breathing, progressive muscle relaxation, and/or guided imagery.  The purpose of this lesson is to educate patients on the role of stress in their lives and to provide healthy techniques for coping with it.  Learning Barriers/Preferences:  Learning Barriers/Preferences - 11/28/23 1219       Learning Barriers/Preferences   Learning Barriers Sight;Exercise Concerns   Pt has some issues with dizziness and unsteady feeling    Learning Preferences Audio;Computer/Internet;Group Instruction;Individual Instruction;Pictoral;Skilled Demonstration;Verbal Instruction;Video;Written Material          Education Topics:  Knowledge Questionnaire Score:  Knowledge Questionnaire Score - 11/28/23 1220       Knowledge Questionnaire Score   Pre Score 19/24          Core Components/Risk Factors/Patient Goals at Admission:  Personal Goals and Risk Factors at Admission - 11/28/23 1222       Core Components/Risk Factors/Patient Goals on Admission   Diabetes Yes    Intervention Provide education about signs/symptoms and action to take for hypo/hyperglycemia.;Provide education about proper nutrition, including hydration, and aerobic/resistive exercise prescription along with prescribed medications to achieve blood glucose in normal ranges: Fasting glucose 65-99 mg/dL    Expected Outcomes Short Term: Participant verbalizes understanding of the signs/symptoms and immediate care of hyper/hypoglycemia, proper foot care and importance of medication, aerobic/resistive exercise and nutrition plan for blood glucose control.;Long Term: Attainment of HbA1C < 7%.    Heart Failure Yes    Intervention Provide a combined exercise and nutrition program that is supplemented with education, support and counseling about heart failure. Directed toward relieving symptoms such as shortness of breath, decreased exercise tolerance, and extremity edema.    Expected Outcomes Improve functional capacity of life;Short term: Attendance in program 2-3 days a week with increased exercise capacity. Reported lower sodium intake. Reported increased fruit and vegetable intake. Reports medication compliance.;Short term: Daily weights obtained and reported for increase. Utilizing diuretic protocols set by physician.;Long term: Adoption of self-care skills and reduction of barriers for early signs and symptoms recognition and intervention leading to self-care  maintenance.    Hypertension Yes    Intervention Provide education on lifestyle modifcations including regular physical activity/exercise, weight management, moderate sodium restriction and increased consumption of fresh fruit, vegetables, and low fat dairy, alcohol moderation, and smoking cessation.;Monitor prescription use compliance.    Expected Outcomes Short Term: Continued assessment and intervention until BP is < 140/43mm HG in hypertensive participants. < 130/1mm HG in hypertensive participants with diabetes, heart failure or chronic kidney disease.;Long Term: Maintenance of blood pressure at goal levels.    Lipids Yes    Intervention Provide education and support for participant on nutrition & aerobic/resistive exercise along with prescribed medications to achieve LDL 70mg , HDL >40mg .    Expected Outcomes Short Term: Participant states understanding of desired cholesterol values and is compliant with medications prescribed. Participant is following exercise prescription and nutrition guidelines.;Long Term: Cholesterol controlled with medications as prescribed, with individualized exercise RX and with personalized nutrition plan. Value goals: LDL < 70mg , HDL > 40 mg.    Stress Yes    Intervention Offer individual and/or small group education and  counseling on adjustment to heart disease, stress management and health-related lifestyle change. Teach and support self-help strategies.;Refer participants experiencing significant psychosocial distress to appropriate mental health specialists for further evaluation and treatment. When possible, include family members and significant others in education/counseling sessions.    Expected Outcomes Short Term: Participant demonstrates changes in health-related behavior, relaxation and other stress management skills, ability to obtain effective social support, and compliance with psychotropic medications if prescribed.;Long Term: Emotional wellbeing is  indicated by absence of clinically significant psychosocial distress or social isolation.    Personal Goal Other Yes    Personal Goal short term: know plan for home exercise. Long term: increase strength and energy    Intervention Will continue to monitor pt and progress WL as tolerated W/O sign or symptom    Expected Outcomes Pt will achieve her goals and increase strength          Core Components/Risk Factors/Patient Goals Review:   Goals and Risk Factor Review     Row Name 12/02/23 1728 12/06/23 0932           Core Components/Risk Factors/Patient Goals Review   Personal Goals Review Weight Management/Obesity;Lipids;Hypertension;Stress;Diabetes;Heart Failure Weight Management/Obesity;Lipids;Hypertension;Stress;Diabetes;Heart Failure      Review Indiah started cardiac rehab on 12/02/23. Phila did well with exercise. Entry systolic BP in the 90's asymptomatic. Exertional and exit BP WNL. Will continue to monitor BP Carolin started cardiac rehab on 12/02/23. Ethleen is off to a fair start with exercise. Will continue to monitor BP. It is noted the Palmer Bobo NP discontinued Lindzy's amlodipine  due to low BP.      Expected Outcomes Annalyn will continue to particpate in cardiac rehab for exercise, nutrition and lifestyle modificaiotns Shamra will continue to particpate in cardiac rehab for exercise, nutrition and lifestyle modificaiotns         Core Components/Risk Factors/Patient Goals at Discharge (Final Review):   Goals and Risk Factor Review - 12/06/23 0932       Core Components/Risk Factors/Patient Goals Review   Personal Goals Review Weight Management/Obesity;Lipids;Hypertension;Stress;Diabetes;Heart Failure    Review Temara started cardiac rehab on 12/02/23. Tariyah is off to a fair start with exercise. Will continue to monitor BP. It is noted the Palmer Bobo NP discontinued Adri's amlodipine  due to low BP.    Expected Outcomes Twisha will continue to  particpate in cardiac rehab for exercise, nutrition and lifestyle modificaiotns          ITP Comments:  ITP Comments     Row Name 11/28/23 1914 12/02/23 1723 12/06/23 [id number]       ITP Comments Gaylyn Keas, MD: Medical Director. Introduction to the [facility] Education Program /Intensive Cardiac Rehab.  Initial orientation packet reviewed with the patient. 30 Day ITP Review. Hildegard started cardiac rehab on 12/02/23. Honour did well with exercise for her fitness level 30 Day ITP Review. Correen started cardiac rehab on 12/02/23. Clemmie is off to a fair start with exercise for her fitness level        Comments: See ITP Comments

## 2023-12-11 ENCOUNTER — Telehealth (HOSPITAL_COMMUNITY): Payer: Self-pay

## 2023-12-11 ENCOUNTER — Encounter (HOSPITAL_COMMUNITY): Admission: RE | Admit: 2023-12-11 | Source: Ambulatory Visit

## 2023-12-11 NOTE — Telephone Encounter (Signed)
 Patient c/o sick for 10:15 CR class. Stated she is not feeling well, informed patient she must be 48 hours symptom free before returning to class.

## 2023-12-13 ENCOUNTER — Encounter (HOSPITAL_COMMUNITY)
Admission: RE | Admit: 2023-12-13 | Discharge: 2023-12-13 | Disposition: A | Discharge status: Home / Self Care | Source: Ambulatory Visit | Attending: Cardiovascular Disease | Admitting: Cardiovascular Disease

## 2023-12-13 ENCOUNTER — Other Ambulatory Visit: Payer: Self-pay

## 2023-12-13 ENCOUNTER — Telehealth: Payer: Self-pay | Admitting: Cardiovascular Disease

## 2023-12-13 ENCOUNTER — Encounter (HOSPITAL_COMMUNITY): Payer: Self-pay

## 2023-12-13 ENCOUNTER — Emergency Department (HOSPITAL_COMMUNITY)

## 2023-12-13 ENCOUNTER — Emergency Department (HOSPITAL_COMMUNITY)
Admission: EM | Admit: 2023-12-13 | Discharge: 2023-12-13 | Disposition: A | Discharge status: Home / Self Care | Attending: Emergency Medicine | Admitting: Emergency Medicine

## 2023-12-13 DIAGNOSIS — Z951 Presence of aortocoronary bypass graft: Secondary | ICD-10-CM

## 2023-12-13 DIAGNOSIS — R079 Chest pain, unspecified: Secondary | ICD-10-CM | POA: Diagnosis not present

## 2023-12-13 DIAGNOSIS — J9811 Atelectasis: Secondary | ICD-10-CM | POA: Diagnosis not present

## 2023-12-13 DIAGNOSIS — Z7982 Long term (current) use of aspirin: Secondary | ICD-10-CM | POA: Diagnosis not present

## 2023-12-13 DIAGNOSIS — I517 Cardiomegaly: Secondary | ICD-10-CM | POA: Diagnosis not present

## 2023-12-13 DIAGNOSIS — Z7902 Long term (current) use of antithrombotics/antiplatelets: Secondary | ICD-10-CM | POA: Insufficient documentation

## 2023-12-13 DIAGNOSIS — R0789 Other chest pain: Secondary | ICD-10-CM | POA: Diagnosis not present

## 2023-12-13 DIAGNOSIS — E876 Hypokalemia: Secondary | ICD-10-CM | POA: Diagnosis not present

## 2023-12-13 LAB — CBC
HCT: 40.3 % (ref 36.0–46.0)
Hemoglobin: 12.5 g/dL (ref 12.0–15.0)
MCH: 26.2 pg (ref 26.0–34.0)
MCHC: 31 g/dL (ref 30.0–36.0)
MCV: 84.3 fL (ref 80.0–100.0)
Platelets: 273 10*3/uL (ref 150–400)
RBC: 4.78 MIL/uL (ref 3.87–5.11)
RDW: 14 % (ref 11.5–15.5)
WBC: 8.8 10*3/uL (ref 4.0–10.5)
nRBC: 0 % (ref 0.0–0.2)

## 2023-12-13 LAB — BASIC METABOLIC PANEL WITH GFR
Anion gap: 14 (ref 5–15)
BUN: 20 mg/dL (ref 8–23)
CO2: 25 mmol/L (ref 22–32)
Calcium: 9.7 mg/dL (ref 8.9–10.3)
Chloride: 101 mmol/L (ref 98–111)
Creatinine, Ser: 0.79 mg/dL (ref 0.44–1.00)
GFR, Estimated: >60 mL/min (ref >60)
Glucose, Bld: 137 mg/dL — ABNORMAL HIGH (ref 70–99)
Potassium: 4 mmol/L (ref 3.5–5.1)
Sodium: 140 mmol/L (ref 135–145)

## 2023-12-13 LAB — TROPONIN I (HIGH SENSITIVITY)
Troponin I (High Sensitivity): 8 ng/L (ref <18)
Troponin I (High Sensitivity): 9 ng/L (ref <18)

## 2023-12-13 LAB — POTASSIUM: Potassium: 3.4 mmol/L — ABNORMAL LOW (ref 3.5–5.1)

## 2023-12-13 MED ORDER — POTASSIUM CHLORIDE CRYS ER 20 MEQ PO TBCR
40.0000 meq | EXTENDED_RELEASE_TABLET | Freq: Once | ORAL | Status: AC
  Administered 2023-12-13: 40 meq via ORAL
  Filled 2023-12-13: qty 2

## 2023-12-13 MED ORDER — LACTATED RINGERS IV BOLUS
500.0000 mL | Freq: Once | INTRAVENOUS | Status: AC
  Administered 2023-12-13: 500 mL via INTRAVENOUS

## 2023-12-13 NOTE — ED Triage Notes (Signed)
 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 Pt states in the past few weeks she has had intermittent chest pain that is now worsening to the point she is unable to sleep. Pt states she feels lightheaded and weak. Pt sent to ED by cardiologist. Pt has had nausea and shortness of breath. Pt states she has fallen d/t feeling lightheaded.

## 2023-12-13 NOTE — Discharge Instructions (Signed)
 Follow-up with your cardiologist and primary care physician in regards to your labile blood pressures.  Make sure you eat and drink appropriately.  If you develop recurrent, continued, or worsening chest pain, shortness of breath, fever, vomiting, abdominal or back pain, or any other new/concerning symptoms then return to the ER for evaluation.

## 2023-12-13 NOTE — Telephone Encounter (Signed)
 Pt c/o of Chest Pain: STAT if active (IN THIS MOMENT) CP, including tightness, pressure, jaw pain, shoulder/upper arm/back pain, SOB, nausea, and vomiting.  1. Are you having CP right now (tightness, pressure, or discomfort)?  No  2. Are you experiencing any other symptoms (ex. SOB, nausea, vomiting, sweating)?  Recent spells of being off-balanced/dizzy   3. How long have you been experiencing CP?  Has been occurring the past few nights   4. Is your CP continuous or coming and going?  Coming and coming  5. Have you taken Nitroglycerin ?  Patient says she hasn't been prescribed nitroglycerin 

## 2023-12-13 NOTE — ED Provider Notes (Signed)
 [place] EMERGENCY DEPARTMENT AT [facility] Provider Note   CSN: [id number] Arrival date & time: 12/13/23  1403     Patient presents with: Chest Pain   [name] is a 68 y.o. female.   HPI 68 year old female presents with chest pain and lightheadedness.  She has been feeling off for several weeks.  When she first wakes up she will feel lightheaded and then throughout the day she will feel like she is going to pass out intermittently.  Primarily this occurs when she is standing.  Over the past weeks she has also had some intermittent chest pain, typically when she first wakes up she will have sharp left-sided chest pain that worsens with certain positions.  No current pain.  Last pain episode was around 3 AM.  No abdominal pain or vomiting but she thinks she does not drink enough water.  She has had her medications adjusted as she has been running some low blood pressures at home.  Sometimes she has had blood pressures in the 80s and stated her heart rate has dropped into the 60s.  She had her amlodipine  stopped about a week ago.  She presents here for assessment due to the on and off chest pain and lightheadedness.  Sometimes gets a headache but no headache right now.  No focal weakness or numbness.  Prior to Admission medications   Medication Sig Start Date End Date Taking? Authorizing Provider  Accu-Chek Softclix Lancets lancets Use to check blood sugar once daily. 12/03/23   Newlin, Enobong, MD  aspirin  EC 81 MG tablet Take 1 tablet (81 mg total) by mouth daily. 07/10/23   Barrett, Erin R, PA-C  Blood Glucose Monitoring Suppl (ACCU-CHEK GUIDE) w/Device KIT Use to check blood sugar once daily. 12/03/23   Newlin, Enobong, MD  clopidogrel  (PLAVIX ) 75 MG tablet Take 1 tablet (75 mg total) by mouth daily. 11/01/23 10/31/24  Gerald Kitty., NP  dapagliflozin  propanediol (FARXIGA ) 10 MG TABS tablet Take 1 tablet (10 mg total) by mouth daily before breakfast. 11/08/23   Marius Siemens, NP  dicyclomine  (BENTYL ) 10 MG capsule Take 1 capsule (10 mg total) by mouth 3 (three) times daily before meals. 02/04/23   Asencion Blacksmith, MD  doxycycline  (VIBRAMYCIN ) 100 MG capsule Take 1 capsule (100 mg total) by mouth 2 (two) times daily. 08/19/23   Lind Repine, MD  DULoxetine  (CYMBALTA ) 30 MG capsule Take 1 capsule (30 mg total) by mouth daily. 05/02/23   Marius Siemens, NP  Evolocumab  (REPATHA  SURECLICK) 140 MG/ML SOAJ Inject 140 mg into the skin every 14 (fourteen) days. 09/13/23   Patel, Vaishali K, RPH  ezetimibe  (ZETIA ) 10 MG tablet Take 1 tablet (10 mg total) by mouth daily. 11/01/23   Gerald Kitty., NP  fluconazole  (DIFLUCAN ) 150 MG tablet Take 1 tablet (150 mg total) by mouth daily. 08/09/23   Marius Siemens, NP  furosemide  (LASIX ) 20 MG tablet Take 1 tablet (20 mg total) by mouth daily as needed. 11/01/23   Gerald Kitty., NP  glucose blood (ACCU-CHEK GUIDE TEST) test strip Use to check blood sugar once daily. 12/03/23   Newlin, Enobong, MD  hydrOXYzine  (ATARAX ) 25 MG tablet Take 1 tablet (25 mg total) by mouth every 6 (six) hours. 08/19/23   Lind Repine, MD  JANUVIA  25 MG tablet Take 25 mg by mouth daily. 09/02/23   [provider]  losartan -hydrochlorothiazide  (HYZAAR ) 100-25 MG tablet Take 1 tablet by mouth  daily. 11/01/23   Gerald Kitty., NP  methocarbamol  (ROBAXIN ) 500 MG tablet Take 1 tablet (500 mg total) by mouth every 8 (eight) hours as needed for muscle spasms. 06/14/22   Wedderburn, Ngozi N, NP  methylPREDNISolone  (MEDROL ) 4 MG tablet To finish hospital course: 07/10/23 Take 1 tablet at supper, and 2 tabs at bedtime. on 07/11/23, take 1 tablet at breakfast, lunch, supper and bedtime (4 doses). On 1/17 take 1 tab at breakfast, lunch and bedtime. (3 doses) On 1/18, take 1 tab at breakfast and bedtime (2 doses), then on 1/19 take 1 tab at breakfast. 07/10/23   Barrett, Erin R, PA-C  metoprolol  tartrate (LOPRESSOR ) 100 MG tablet TAKE 1 TABLET IN  THE AM AND 1.5 TABLETS IN THE PM 12/03/23   Dick, Ernest H Jr., NP  pantoprazole  (PROTONIX ) 40 MG tablet TAKE ONE TABLET (40mg  total) BY MOUTH DAILY AT 9AM 06/20/22   Asencion Blacksmith, MD  potassium chloride  SA (KLOR-CON  M) 20 MEQ tablet Take 1 tablet (20 mEq total) by mouth daily as needed (when taking furosemide  (lasix )). 11/01/23   Gerald Kitty., NP  pregabalin (LYRICA) 50 MG capsule Take 50 mg by mouth 3 (three) times daily. 04/30/22   [provider]  rosuvastatin  (CRESTOR ) 40 MG tablet Take 1 tablet (40 mg total) by mouth daily. 11/01/23   Gerald Kitty., NP    Allergies: Patient has no known allergies.    Review of Systems  Constitutional:  Negative for fever.  Eyes:  Negative for visual disturbance.  Respiratory:  Negative for shortness of breath.   Cardiovascular:  Positive for chest pain.  Gastrointestinal:  Negative for vomiting.  Neurological:  Positive for light-headedness. Negative for syncope, weakness and numbness.    Updated Vital Signs BP 123/81   Pulse 71   Temp 97.7 F (36.5 C)   Resp 17   Ht 5' 1 (1.549 m)   Wt 49.9 kg   SpO2 100%   BMI 20.78 kg/m   Physical Exam Vitals and nursing note reviewed.  Constitutional:      Appearance: She is well-developed.  HENT:     Head: Normocephalic and atraumatic.   Eyes:     Extraocular Movements: Extraocular movements intact.     Pupils: Pupils are equal, round, and reactive to light.    Cardiovascular:     Rate and Rhythm: Normal rate and regular rhythm.     Heart sounds: Normal heart sounds.  Pulmonary:     Effort: Pulmonary effort is normal.     Breath sounds: Normal breath sounds.  Abdominal:     Palpations: Abdomen is soft.     Tenderness: There is no abdominal tenderness.   Musculoskeletal:     Cervical back: No rigidity.   Skin:    General: Skin is warm and dry.   Neurological:     Mental Status: She is alert.     Comments: CN 3-12 grossly intact. 5/5 strength in all 4  extremities. Grossly normal sensation. Normal finger to nose. Normal gait.    (all labs ordered are listed, but only abnormal results are displayed) Labs Reviewed  BASIC METABOLIC PANEL WITH GFR - Abnormal; Notable for the following components:      Result Value   Glucose, Bld 137 (*)    All other components within normal limits  POTASSIUM - Abnormal; Notable for the following components:   Potassium 3.4 (*)    All other components within normal limits  CBC  TROPONIN I (HIGH SENSITIVITY)  TROPONIN I (HIGH SENSITIVITY)    EKG: EKG Interpretation Date/Time:  Friday December 13 2023 14:11:59 EDT Ventricular Rate:  80 PR Interval:  166 QRS Duration:  86 QT Interval:  406 QTC Calculation: 468 R Axis:   -16  Text Interpretation: Normal sinus rhythm  nonspecific ST/T changes similar to April 2025 Confirmed by Jerilynn Montenegro [phone]) on 12/13/2023 4:07:38 PM  Radiology: DG Chest 2 View Result Date: 12/13/2023 CLINICAL DATA:  Chest pain. EXAM: CHEST - 2 VIEW COMPARISON:  10/08/2023. Chest, abdomen and pelvis CTA dated 05/17/2018. Abdomen and pelvis CT dated 12/03/2022. FINDINGS: Borderline enlarged cardiac silhouette. Post CABG changes. No significant change in a large posterior left diaphragmatic eventration and adjacent left basilar atelectasis. The remainder of the lungs remain clear. Thoracic spine degenerative changes and cervical spine fixation hardware. IMPRESSION: 1. No acute abnormality. 2. Borderline cardiomegaly. 3. Unchanged large posterior left diaphragmatic eventration and adjacent left basilar atelectasis. Electronically Signed   By: Catherin Closs M.D.   On: 12/13/2023 17:34     Procedures   Medications Ordered in the ED  lactated ringers  bolus 500 mL (0 mLs Intravenous Stopped 12/13/23 1855)  potassium chloride  SA (KLOR-CON  M) CR tablet 40 mEq (40 mEq Oral Given 12/13/23 1855)                                    Medical Decision Making Amount and/or Complexity of Data  Reviewed Labs: ordered.    Details: Normal troponin x 2.  Mild hypokalemia Radiology: ordered and independent interpretation performed.    Details: No CHF ECG/medicine tests: ordered and independent interpretation performed.    Details: No ischemia  Risk Prescription drug management.   Patient presents with chest pain that does not sound like ACS.  Low suspicion for PE or dissection as well.  Patient is well-appearing, currently asymptomatic.  Has had on and off lightheadedness that might be more blood pressure and blood pressure medicine related.  Here her vitals are reassuring.  She was given a small bolus of fluids.  She feels well at this point and has no chest pain and has 2 negative troponins.  At this point, I think she is stable for outpatient follow-up with her cardiology and PCP team.  Will discharge home with return precautions.     Final diagnoses:  Nonspecific chest pain    ED Discharge Orders          Ordered    Ambulatory referral to Cardiology       Comments: If you have not heard from the Cardiology office within the next 72 hours please call [phone].   12/13/23 1906               Jerilynn Montenegro, MD 12/13/23 1932

## 2023-12-13 NOTE — ED Provider Triage Note (Cosign Needed)
 Emergency Medicine Provider Triage Evaluation Note  [name] , a 68 y.o. female  was evaluated in triage.  Pt complains of chest discomfort.  Patients symptoms are intermittent, she reports stabbing anterior chest pain woke her up out of her sleep this morning, she experienced an additional episode of pain this afternoon which has since resolved.  She went to her exercise class as normal today and was becoming extremely diaphoretic despite not exercising as hard as she normally does.  She did have an episode of nausea earlier today which has since resolved, no vomiting.  Based on the symptoms she presents to the emergency department at the direction of her cardiologist.  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  Review of Systems  Positive: As above Negative: As above  Physical Exam  BP 123/78   Pulse 76   Temp 97.7 F (36.5 C)   Resp 17   Ht 5' 1 (1.549 m)   Wt 49.9 kg   SpO2 100%   BMI 20.78 kg/m  Gen:   Awake, no distress   Resp:  Normal effort  MSK:   Moves extremities without difficulty  Other:  Normal S1/S2, lungs clear to auscultation bilaterally  Medical Decision Making  Medically screening exam initiated at 2:19 PM.  Appropriate orders placed.  [name] was informed that the remainder of the evaluation will be completed by another provider, this initial triage assessment does not replace that evaluation, and the importance of remaining in the ED until their evaluation is complete.     Kendrick Pax, [place] 12/13/23 1421

## 2023-12-13 NOTE — Telephone Encounter (Signed)
 Called patient back about message. Patient complaining of chest pain the comes and goes that feels like a sharp, like shocking feeling and SOB with activity and dizziness/off balance issues. Patient stated this happens when she is sleeping or awake. Patient stated her BP is 106/60 and HR 60's. This has been going on for a couple of weeks. Encouraged patient to go to ED due to her cardiac history. Patient is going to the ED because she is really concerned. Will forward to Dr. Alroy Aspen, so he is aware.

## 2023-12-16 ENCOUNTER — Encounter (HOSPITAL_COMMUNITY)
Admission: RE | Admit: 2023-12-16 | Discharge: 2023-12-16 | Disposition: A | Discharge status: Home / Self Care | Source: Ambulatory Visit | Attending: Cardiovascular Disease | Admitting: Cardiovascular Disease

## 2023-12-16 DIAGNOSIS — Z951 Presence of aortocoronary bypass graft: Secondary | ICD-10-CM | POA: Diagnosis not present

## 2023-12-16 NOTE — Progress Notes (Signed)
 Patient here for cardiac rehab orientation. It was noted that Marlei went to the ED with complains of feeling lightheaded and . Troponin were negative. Patient was given an IVF bolus. Denies symptoms today. Telemetry rhythm Sinus rate 83. Sitting blood pressure 112/68 sitting. standing bp 98/70. Asymptomatic. Will discussed with onsite provider Josefa Beauvais NP before proceeding with exercise. Patient said she drank 8 ounces of water this morning and was given additonal water to drink. Patient encouraged to make sure that she hydrates adequately before proceeding with exercise. Discussed with onsite provider Josefa Beauvais NP okay to proceed with exercise. Will continue to monitor the patient. Hadassah Elpidio Quan RN BSN

## 2023-12-17 ENCOUNTER — Ambulatory Visit: Admitting: Cardiovascular Disease

## 2023-12-18 ENCOUNTER — Encounter (HOSPITAL_COMMUNITY)
Admission: RE | Admit: 2023-12-18 | Discharge: 2023-12-18 | Disposition: A | Discharge status: Home / Self Care | Source: Ambulatory Visit | Attending: Cardiovascular Disease | Admitting: Cardiovascular Disease

## 2023-12-18 DIAGNOSIS — Z951 Presence of aortocoronary bypass graft: Secondary | ICD-10-CM

## 2023-12-18 LAB — GLUCOSE, CAPILLARY: Glucose-Capillary: 181 mg/dL — ABNORMAL HIGH (ref 70–99)

## 2023-12-20 ENCOUNTER — Encounter (HOSPITAL_COMMUNITY)
Admission: RE | Admit: 2023-12-20 | Discharge: 2023-12-20 | Disposition: A | Discharge status: Home / Self Care | Source: Ambulatory Visit | Attending: Cardiovascular Disease | Admitting: Cardiovascular Disease

## 2023-12-20 DIAGNOSIS — Z951 Presence of aortocoronary bypass graft: Secondary | ICD-10-CM | POA: Diagnosis not present

## 2023-12-23 ENCOUNTER — Encounter (HOSPITAL_COMMUNITY)
Admission: RE | Admit: 2023-12-23 | Discharge: 2023-12-23 | Disposition: A | Discharge status: Home / Self Care | Source: Ambulatory Visit | Attending: Cardiovascular Disease | Admitting: Cardiovascular Disease

## 2023-12-23 DIAGNOSIS — Z951 Presence of aortocoronary bypass graft: Secondary | ICD-10-CM | POA: Diagnosis not present

## 2023-12-23 NOTE — Progress Notes (Signed)
 Reviewed home exercise guidelines with [name] including endpoints, temperature precautions, target heart rate and rate of perceived exertion. She is currently walking and has access to a small gym at her residence that she uses as her mode of home exercise. She is also looking into joining the [facility] [place] voices understanding of instructions given.  Arnoldo CHRISTELLA Gal, MS, ACSM CEP

## 2023-12-24 ENCOUNTER — Telehealth (INDEPENDENT_AMBULATORY_CARE_PROVIDER_SITE_OTHER): Payer: Self-pay | Admitting: Primary Care

## 2023-12-24 NOTE — Telephone Encounter (Signed)
 Copied from CRM [id number]. Topic: Clinical - Medication Question >> Dec 24, 2023  3:18 PM Elle L wrote: Reason for CRM: The patient is requesting an order for a diabetic meter prescription as she states she spoke to her insurance company, [employer], and they will cover it fully. Their fax number is [phone] to send the prescription. The patient's call back number is [phone].

## 2023-12-24 NOTE — Telephone Encounter (Signed)
Luke would you be able to assist with this  °

## 2023-12-24 NOTE — Telephone Encounter (Signed)
 Returned pt call and made aware that rx for dm supplies was sent last month. Pt states she wasn't informed by pharmacy, but she will reach out to the pharmacy to see if rx is still there. Nothing further needs to be done

## 2023-12-25 ENCOUNTER — Encounter (HOSPITAL_COMMUNITY): Payer: Self-pay

## 2023-12-25 ENCOUNTER — Emergency Department (HOSPITAL_COMMUNITY)

## 2023-12-25 ENCOUNTER — Other Ambulatory Visit: Payer: Self-pay

## 2023-12-25 ENCOUNTER — Encounter (HOSPITAL_COMMUNITY): Admission: RE | Admit: 2023-12-25 | Source: Ambulatory Visit

## 2023-12-25 ENCOUNTER — Emergency Department (HOSPITAL_COMMUNITY)
Admission: EM | Admit: 2023-12-25 | Discharge: 2023-12-25 | Disposition: A | Discharge status: Home / Self Care | Attending: Emergency Medicine | Admitting: Emergency Medicine

## 2023-12-25 DIAGNOSIS — R9389 Abnormal findings on diagnostic imaging of other specified body structures: Secondary | ICD-10-CM | POA: Diagnosis not present

## 2023-12-25 DIAGNOSIS — R059 Cough, unspecified: Secondary | ICD-10-CM | POA: Insufficient documentation

## 2023-12-25 DIAGNOSIS — R0602 Shortness of breath: Secondary | ICD-10-CM | POA: Insufficient documentation

## 2023-12-25 DIAGNOSIS — Z981 Arthrodesis status: Secondary | ICD-10-CM | POA: Diagnosis not present

## 2023-12-25 DIAGNOSIS — Z7982 Long term (current) use of aspirin: Secondary | ICD-10-CM | POA: Insufficient documentation

## 2023-12-25 DIAGNOSIS — Z7902 Long term (current) use of antithrombotics/antiplatelets: Secondary | ICD-10-CM | POA: Insufficient documentation

## 2023-12-25 LAB — CBC WITH DIFFERENTIAL/PLATELET
Abs Immature Granulocytes: 0.05 10*3/uL (ref 0.00–0.07)
Basophils Absolute: 0.1 10*3/uL (ref 0.0–0.1)
Basophils Relative: 1 %
Eosinophils Absolute: 0.1 10*3/uL (ref 0.0–0.5)
Eosinophils Relative: 1 %
HCT: 36.3 % (ref 36.0–46.0)
Hemoglobin: 11.9 g/dL — ABNORMAL LOW (ref 12.0–15.0)
Immature Granulocytes: 1 %
Lymphocytes Relative: 31 %
Lymphs Abs: 2.4 10*3/uL (ref 0.7–4.0)
MCH: 26.9 pg (ref 26.0–34.0)
MCHC: 32.8 g/dL (ref 30.0–36.0)
MCV: 82.1 fL (ref 80.0–100.0)
Monocytes Absolute: 0.5 10*3/uL (ref 0.1–1.0)
Monocytes Relative: 6 %
Neutro Abs: 4.7 10*3/uL (ref 1.7–7.7)
Neutrophils Relative %: 60 %
Platelets: 292 10*3/uL (ref 150–400)
RBC: 4.42 MIL/uL (ref 3.87–5.11)
RDW: 14.6 % (ref 11.5–15.5)
WBC: 7.7 10*3/uL (ref 4.0–10.5)
nRBC: 0 % (ref 0.0–0.2)

## 2023-12-25 LAB — COMPREHENSIVE METABOLIC PANEL WITH GFR
ALT: 33 U/L (ref 0–44)
AST: 33 U/L (ref 15–41)
Albumin: 3.7 g/dL (ref 3.5–5.0)
Alkaline Phosphatase: 77 U/L (ref 38–126)
Anion gap: 12 (ref 5–15)
BUN: 19 mg/dL (ref 8–23)
CO2: 26 mmol/L (ref 22–32)
Calcium: 9.8 mg/dL (ref 8.9–10.3)
Chloride: 103 mmol/L (ref 98–111)
Creatinine, Ser: 0.83 mg/dL (ref 0.44–1.00)
GFR, Estimated: >60 mL/min (ref >60)
Glucose, Bld: 157 mg/dL — ABNORMAL HIGH (ref 70–99)
Potassium: 3.4 mmol/L — ABNORMAL LOW (ref 3.5–5.1)
Sodium: 141 mmol/L (ref 135–145)
Total Bilirubin: 0.9 mg/dL (ref 0.0–1.2)
Total Protein: 7.3 g/dL (ref 6.5–8.1)

## 2023-12-25 LAB — D-DIMER, QUANTITATIVE: D-Dimer, Quant: 0.56 ug{FEU}/mL — ABNORMAL HIGH (ref 0.00–0.50)

## 2023-12-25 LAB — TROPONIN I (HIGH SENSITIVITY)
Troponin I (High Sensitivity): 8 ng/L (ref <18)
Troponin I (High Sensitivity): 7 ng/L (ref <18)

## 2023-12-25 LAB — LIPASE, BLOOD: Lipase: 34 U/L (ref 11–51)

## 2023-12-25 LAB — BRAIN NATRIURETIC PEPTIDE: B Natriuretic Peptide: 45.7 pg/mL (ref 0.0–100.0)

## 2023-12-25 NOTE — ED Triage Notes (Addendum)
 Pt states she woke up this morning short of breath, hx of CHF and recent bypass surgery. Pt denies chest pain. No swelling.  Pt also c.o tingling in her left hand for a few days and states she felt off balance when she woke up this morning, hx of TIA

## 2023-12-25 NOTE — ED Provider Notes (Signed)
 [place] EMERGENCY DEPARTMENT AT [facility] Provider Note   CSN: [id number] Arrival date & time: 12/25/23  9063     Patient presents with: Shortness of Breath   [name] is a 68 y.o. female.   68 yo F with a chief complaint of difficulty breathing.  This was noticed this morning.  It is now quite a bit better.  She thinks sitting up made it better.  She had bypass surgery done about 5 months ago and has been doing cardiac rehab.  No significant symptoms with exercise.  She denies any chest pain or pressure.  Has had a little bit of a cough.  This been going on for some time now.  She said she had a family member that ended up having a chronic cough that later seem to be due to a problem that ended up killing them.  But is left her a bit worried.     Shortness of Breath      Prior to Admission medications   Medication Sig Start Date End Date Taking? Authorizing Provider  Accu-Chek Softclix Lancets lancets Use to check blood sugar once daily. 12/03/23   Newlin, Enobong, MD  aspirin  EC 81 MG tablet Take 1 tablet (81 mg total) by mouth daily. 07/10/23   Barrett, Erin R, PA-C  Blood Glucose Monitoring Suppl (ACCU-CHEK GUIDE) w/Device KIT Use to check blood sugar once daily. 12/03/23   Newlin, Enobong, MD  clopidogrel  (PLAVIX ) 75 MG tablet Take 1 tablet (75 mg total) by mouth daily. 11/01/23 10/31/24  Wyn Jackee VEAR Mickey., NP  dapagliflozin  propanediol (FARXIGA ) 10 MG TABS tablet Take 1 tablet (10 mg total) by mouth daily before breakfast. 11/08/23   Celestia Rosaline SQUIBB, NP  dicyclomine  (BENTYL ) 10 MG capsule Take 1 capsule (10 mg total) by mouth 3 (three) times daily before meals. 02/04/23   Aneita Gwendlyn DASEN, MD  doxycycline  (VIBRAMYCIN ) 100 MG capsule Take 1 capsule (100 mg total) by mouth 2 (two) times daily. 08/19/23   Dasie Faden, MD  DULoxetine  (CYMBALTA ) 30 MG capsule Take 1 capsule (30 mg total) by mouth daily. 05/02/23   Celestia Rosaline SQUIBB, NP  Evolocumab  (REPATHA   SURECLICK) 140 MG/ML SOAJ Inject 140 mg into the skin every 14 (fourteen) days. 09/13/23   Patel, Vaishali K, RPH  ezetimibe  (ZETIA ) 10 MG tablet Take 1 tablet (10 mg total) by mouth daily. 11/01/23   Wyn Jackee VEAR Mickey., NP  fluconazole  (DIFLUCAN ) 150 MG tablet Take 1 tablet (150 mg total) by mouth daily. 08/09/23   Celestia Rosaline SQUIBB, NP  furosemide  (LASIX ) 20 MG tablet Take 1 tablet (20 mg total) by mouth daily as needed. 11/01/23   Wyn Jackee VEAR Mickey., NP  glucose blood (ACCU-CHEK GUIDE TEST) test strip Use to check blood sugar once daily. 12/03/23   Newlin, Enobong, MD  hydrOXYzine  (ATARAX ) 25 MG tablet Take 1 tablet (25 mg total) by mouth every 6 (six) hours. 08/19/23   Dasie Faden, MD  JANUVIA  25 MG tablet Take 25 mg by mouth daily. 09/02/23   [provider]  losartan -hydrochlorothiazide  (HYZAAR ) 100-25 MG tablet Take 1 tablet by mouth daily. 11/01/23   Wyn Jackee VEAR Mickey., NP  methocarbamol  (ROBAXIN ) 500 MG tablet Take 1 tablet (500 mg total) by mouth every 8 (eight) hours as needed for muscle spasms. 06/14/22   Wedderburn, Ngozi N, NP  methylPREDNISolone  (MEDROL ) 4 MG tablet To finish hospital course: 07/10/23 Take 1 tablet at supper, and 2 tabs at bedtime. on  07/11/23, take 1 tablet at breakfast, lunch, supper and bedtime (4 doses). On 1/17 take 1 tab at breakfast, lunch and bedtime. (3 doses) On 1/18, take 1 tab at breakfast and bedtime (2 doses), then on 1/19 take 1 tab at breakfast. 07/10/23   Barrett, Erin R, PA-C  metoprolol  tartrate (LOPRESSOR ) 100 MG tablet TAKE 1 TABLET IN THE AM AND 1.5 TABLETS IN THE PM 12/03/23   Dick, Ernest H Jr., NP  pantoprazole  (PROTONIX ) 40 MG tablet TAKE ONE TABLET (40mg  total) BY MOUTH DAILY AT 9AM 06/20/22   Aneita Gwendlyn DASEN, MD  potassium chloride  SA (KLOR-CON  M) 20 MEQ tablet Take 1 tablet (20 mEq total) by mouth daily as needed (when taking furosemide  (lasix )). 11/01/23   Wyn Jackee VEAR Mickey., NP  pregabalin (LYRICA) 50 MG capsule Take 50 mg by mouth 3 (three)  times daily. 04/30/22   [provider]  rosuvastatin  (CRESTOR ) 40 MG tablet Take 1 tablet (40 mg total) by mouth daily. 11/01/23   Wyn Jackee VEAR Mickey., NP    Allergies: Patient has no known allergies.    Review of Systems  Respiratory:  Positive for shortness of breath.     Updated Vital Signs BP 115/77   Pulse 79   Temp 97.6 F (36.4 C)   Resp 13   SpO2 100%   Physical Exam Vitals and nursing note reviewed.  Constitutional:      General: She is not in acute distress.    Appearance: She is well-developed. She is not diaphoretic.  HENT:     Head: Normocephalic and atraumatic.  Eyes:     Pupils: Pupils are equal, round, and reactive to light.  Cardiovascular:     Rate and Rhythm: Normal rate and regular rhythm.     Heart sounds: No murmur heard.    No friction rub. No gallop.  Pulmonary:     Effort: Pulmonary effort is normal.     Breath sounds: No wheezing or rales.  Abdominal:     General: There is no distension.     Palpations: Abdomen is soft.     Tenderness: There is no abdominal tenderness.  Musculoskeletal:        General: No tenderness.     Cervical back: Normal range of motion and neck supple.  Skin:    General: Skin is warm and dry.  Neurological:     Mental Status: She is alert and oriented to person, place, and time.  Psychiatric:        Behavior: Behavior normal.     (all labs ordered are listed, but only abnormal results are displayed) Labs Reviewed  CBC WITH DIFFERENTIAL/PLATELET - Abnormal; Notable for the following components:      Result Value   Hemoglobin 11.9 (*)    All other components within normal limits  COMPREHENSIVE METABOLIC PANEL WITH GFR - Abnormal; Notable for the following components:   Potassium 3.4 (*)    Glucose, Bld 157 (*)    All other components within normal limits  D-DIMER, QUANTITATIVE - Abnormal; Notable for the following components:   D-Dimer, Quant 0.56 (*)    All other components within normal limits   LIPASE, BLOOD  BRAIN NATRIURETIC PEPTIDE  TROPONIN I (HIGH SENSITIVITY)  TROPONIN I (HIGH SENSITIVITY)    EKG: EKG Interpretation Date/Time:  Wednesday December 25 2023 09:50:52 EDT Ventricular Rate:  96 PR Interval:  143 QRS Duration:  92 QT Interval:  374 QTC Calculation: 473 R Axis:   -34  Text  Interpretation: Sinus rhythm Ventricular premature complex Left axis deviation Borderline repolarization abnormality No significant change since last tracing Confirmed by Emil Share [phone]) on 12/25/2023 10:36:27 AM  Radiology: [facility] Chest Port 1 View Result Date: 12/25/2023 CLINICAL DATA:  sob EXAM: PORTABLE CHEST - 1 VIEW COMPARISON:  December 13, 2023 FINDINGS: Elevation of the left hemidiaphragm, unchanged. Streaky airspace opacities in the left lung base, unchanged, likely atelectasis. No focal airspace consolidation, pleural effusion, or pneumothorax. No cardiomegaly. Sternotomy wires and CABG markers. No acute fracture or destructive lesion. Cervical fusion hardware. IMPRESSION: No acute cardiopulmonary abnormality. Electronically Signed   By: Rogelia Myers M.D.   On: 12/25/2023 10:23     Procedures   Medications Ordered in the ED - No data to display                                  Medical Decision Making Amount and/or Complexity of Data Reviewed Labs: ordered. Radiology: ordered.   68 yo F with a chief complaints of difficulty breathing.  This started this morning when she woke up.  Lasted for few hours and has resolved.  She has had a bit of a cough but has been going on for a while.  On record review patient had bypass done about 5 months ago.  Has been undergoing cardiac rehab without issue.  Will obtain a laboratory evaluation.  Chest x-ray independently interpreted by me without focal infiltrate or pneumothorax.  2 troponins are negative.  No acute anemia.  No significant electrolyte abnormalities.  Will discharge home.  PCP follow-up.  3:27 PM:  I have discussed the  diagnosis/risks/treatment options with the patient.  Evaluation and diagnostic testing in the emergency department does not suggest an emergent condition requiring admission or immediate intervention beyond what has been performed at this time.  They will follow up with PCP, Cards. We also discussed returning to the ED immediately if new or worsening sx occur. We discussed the sx which are most concerning (e.g., sudden worsening pain, fever, inability to tolerate by mouth) that necessitate immediate return. Medications administered to the patient during their visit and any new prescriptions provided to the patient are listed below.  Medications given during this visit Medications - No data to display   The patient appears reasonably screen and/or stabilized for discharge and I doubt any other medical condition or other [facility] requiring further screening, evaluation, or treatment in the ED at this time prior to discharge.        Final diagnoses:  Shortness of breath    ED Discharge Orders     None          Emil Share, DO 12/25/23 1528

## 2023-12-25 NOTE — ED Notes (Signed)
Patient placed onto cardiac monitor

## 2023-12-25 NOTE — Discharge Instructions (Signed)
 Follow up with your family doc and cardiologist in the office. Return for worsening difficulty breathing

## 2023-12-25 NOTE — ED Notes (Signed)
 Patient provided with discharge paperwork. Pt demonstrated understanding of material. All questions, comments, and concerns addressed. Pt ambulated in hallway towards exit with no complications

## 2023-12-25 NOTE — ED Notes (Signed)
 Provided patient sandwhich bag

## 2023-12-25 NOTE — ED Notes (Signed)
 Called lab about troponin. Lab stating they have received it

## 2023-12-30 ENCOUNTER — Encounter (HOSPITAL_COMMUNITY)
Admission: RE | Admit: 2023-12-30 | Discharge: 2023-12-30 | Disposition: A | Discharge status: Home / Self Care | Source: Ambulatory Visit | Attending: Cardiovascular Disease | Admitting: Cardiovascular Disease

## 2023-12-30 DIAGNOSIS — Z48812 Encounter for surgical aftercare following surgery on the circulatory system: Secondary | ICD-10-CM | POA: Insufficient documentation

## 2023-12-30 DIAGNOSIS — Z7902 Long term (current) use of antithrombotics/antiplatelets: Secondary | ICD-10-CM | POA: Diagnosis not present

## 2023-12-30 DIAGNOSIS — Z951 Presence of aortocoronary bypass graft: Secondary | ICD-10-CM | POA: Diagnosis not present

## 2024-01-01 ENCOUNTER — Telehealth (HOSPITAL_COMMUNITY): Payer: Self-pay

## 2024-01-01 ENCOUNTER — Encounter (HOSPITAL_COMMUNITY): Admission: RE | Admit: 2024-01-01 | Source: Ambulatory Visit

## 2024-01-01 NOTE — Telephone Encounter (Signed)
 Patient informed us  she changed insurance and only has [facility] Medicare as of 12/24/23. Confirmed benefits with insurance, she is covered at 100% for cardiac rehab.

## 2024-01-01 NOTE — Telephone Encounter (Signed)
 Patient c/o for 10:15 CR class due to transportation issues. Will be in Friday.

## 2024-01-03 ENCOUNTER — Encounter (HOSPITAL_COMMUNITY): Admission: RE | Admit: 2024-01-03 | Source: Ambulatory Visit

## 2024-01-06 ENCOUNTER — Encounter (HOSPITAL_COMMUNITY)
Admission: RE | Admit: 2024-01-06 | Discharge: 2024-01-06 | Disposition: A | Discharge status: Home / Self Care | Source: Ambulatory Visit | Attending: Cardiovascular Disease | Admitting: Cardiovascular Disease

## 2024-01-06 DIAGNOSIS — Z48812 Encounter for surgical aftercare following surgery on the circulatory system: Secondary | ICD-10-CM | POA: Diagnosis not present

## 2024-01-06 DIAGNOSIS — Z951 Presence of aortocoronary bypass graft: Secondary | ICD-10-CM | POA: Diagnosis not present

## 2024-01-06 DIAGNOSIS — Z7902 Long term (current) use of antithrombotics/antiplatelets: Secondary | ICD-10-CM | POA: Diagnosis not present

## 2024-01-06 NOTE — Progress Notes (Signed)
 Cardiac Individual Treatment Plan  Patient Details  Name: [name] MRN: [id number] Date of Birth: 19-Feb-1956 Referring Provider:   Flowsheet Row INTENSIVE CARDIAC REHAB ORIENT from 11/28/2023 in [facility]  Referring Provider Dr. 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  Patient's Home Medications on Admission:  Current Outpatient Medications:    Accu-Chek Softclix Lancets lancets, Use to check blood sugar once daily., Disp: 100 each, Rfl: 2   aspirin  EC 81 MG tablet, Take 1 tablet (81 mg total) by mouth daily., Disp: , Rfl:    Blood Glucose Monitoring Suppl (ACCU-CHEK GUIDE) w/Device KIT, Use to check blood sugar once daily., Disp: 1 kit, Rfl: 0   clopidogrel  (PLAVIX ) 75 MG tablet, Take 1 tablet (75 mg total) by mouth daily., Disp: 90 tablet, Rfl: 3   dapagliflozin  propanediol (FARXIGA ) 10 MG TABS tablet, Take 1 tablet (10 mg total) by mouth daily before breakfast., Disp: 90 tablet, Rfl: 1   dicyclomine  (BENTYL ) 10 MG capsule, Take 1 capsule (10 mg total) by mouth 3 (three) times daily before meals., Disp: 90 capsule, Rfl: 11   doxycycline  (VIBRAMYCIN ) 100 MG capsule, Take 1 capsule (100 mg total) by mouth 2 (two) times daily., Disp: 20 capsule, Rfl: 0   DULoxetine  (CYMBALTA ) 30 MG capsule, Take 1 capsule (30 mg total) by mouth daily., Disp: 180 capsule, Rfl: 1   Evolocumab  (REPATHA  SURECLICK) 140 MG/ML SOAJ, Inject 140 mg into the skin every 14 (fourteen) days., Disp: 6 mL, Rfl: 3   ezetimibe  (ZETIA ) 10 MG tablet, Take 1 tablet (10 mg total) by mouth daily., Disp: 90 tablet, Rfl: 3   fluconazole  (DIFLUCAN ) 150 MG tablet, Take 1 tablet (150 mg total) by mouth daily., Disp: 1 tablet, Rfl: 1   furosemide  (LASIX ) 20 MG tablet, Take 1  tablet (20 mg total) by mouth daily as needed., Disp: 90 tablet, Rfl: 3   glucose blood (ACCU-CHEK GUIDE TEST) test strip, Use to check blood sugar once daily., Disp: 100 each, Rfl: 2   hydrOXYzine  (ATARAX ) 25 MG tablet, Take 1 tablet (25 mg total) by mouth every 6 (six) hours., Disp: 12 tablet, Rfl: 0   JANUVIA  25 MG tablet, Take 25 mg by mouth daily., Disp: , Rfl:    losartan -hydrochlorothiazide  (HYZAAR ) 100-25 MG tablet, Take 1 tablet by mouth daily., Disp: 90 tablet, Rfl: 3   methocarbamol  (ROBAXIN ) 500 MG tablet, Take 1 tablet (500 mg total) by mouth every 8 (eight) hours as needed for muscle spasms., Disp: 20 tablet, Rfl: 0   methylPREDNISolone  (MEDROL ) 4 MG tablet, To finish hospital course: 07/10/23 Take 1 tablet at supper, and 2 tabs at bedtime. on 07/11/23, take 1 tablet at breakfast, lunch, supper and bedtime (4 doses). On 1/17 take 1 tab at breakfast, lunch and bedtime. (3 doses) On 1/18, take 1 tab at breakfast and bedtime (2 doses), then on 1/19 take 1 tab at breakfast., Disp: 13 tablet, Rfl: 0   metoprolol  tartrate (LOPRESSOR ) 100 MG tablet, TAKE 1 TABLET IN THE AM AND 1.5 TABLETS IN THE PM, Disp: 225 tablet, Rfl: 3   pantoprazole  (PROTONIX ) 40 MG tablet, TAKE ONE TABLET (40mg  total) BY MOUTH DAILY AT 9AM, Disp: 30 tablet, Rfl: 0   potassium chloride  SA (KLOR-CON  M) 20  MEQ tablet, Take 1 tablet (20 mEq total) by mouth daily as needed (when taking furosemide  (lasix ))., Disp: 90 tablet, Rfl: 3   pregabalin (LYRICA) 50 MG capsule, Take 50 mg by mouth 3 (three) times daily., Disp: , Rfl:    rosuvastatin  (CRESTOR ) 40 MG tablet, Take 1 tablet (40 mg total) by mouth daily., Disp: 90 tablet, Rfl: 3  Past Medical History: Past Medical History:  Diagnosis Date   Anemia    hx   Aneurysm (HCC)    small, on left side of brain    Anxiety    hx of   Arthritis    generalized   CAD (coronary artery disease)    Diabetes mellitus without complication (HCC)    diet control, pt denies- on  medications   GERD (gastroesophageal reflux disease)    iwht certain foods/on meds/OTC PRN meds   Heart failure with mildly reduced ejection fraction (HFmrEF) (HCC)    Hyperlipidemia    on meds   Hypertension    on meds   TIA (transient ischemic attack)    pt unaware of this hx on 06/19/2017    Tobacco Use: Social History   Tobacco Use  Smoking Status Former   Types: Cigarettes  Smokeless Tobacco Never    Labs: Review Flowsheet  More data exists      Latest Ref Rng & Units 05/03/2023 07/02/2023 07/03/2023 07/25/2023 11/08/2023  Labs for ITP Cardiac and Pulmonary Rehab  Cholestrol 100 - 199 mg/dL 731  - - 748  -  LDL (calc) 0 - 99 mg/dL 821  - - 842  -  HDL-C >39 mg/dL 64  - - 59  -  Trlycerides 0 - 149 mg/dL 857  - - 806  -  Hemoglobin A1c 0.0 - 7.0 % - - 6.2  - 7.3   PH, Arterial 7.35 - 7.45 - 7.47  7.321  7.318  7.429  7.453  7.414  7.390  7.458  7.420  - -  PCO2 arterial 32 - 48 mmHg - 32  46.7  46.4  38.1  36.7  40.8  33.2  30.2  27.1  - -  Bicarbonate 20.0 - 28.0 mmol/L - 23.3  24.1  23.8  25.6  25.7  26.1  20.1  22.9  21.4  17.6  - -  TCO2 22 - 32 mmol/L - - 26  25  27  27  28  27   37  21  24  22  24  20  18   - -  Acid-base deficit 0.0 - 2.0 mmol/L - - 2.0  2.0  5.0  2.0  2.0  6.0  - -  O2 Saturation % - 99.2  99  99  99  100  100  100  86  100  100  - -    Details       Multiple values from one day are sorted in reverse-chronological order         Capillary Blood Glucose: Lab Results  Component Value Date   GLUCAP 181 (H) 12/18/2023   GLUCAP 147 (H) 12/04/2023   GLUCAP 193 (H) 12/04/2023   GLUCAP 143 (H) 12/02/2023   GLUCAP 199 (H) 12/02/2023     Exercise Target Goals: Exercise Program Goal: Individual exercise prescription set using results from initial 6 min walk test and THRR while considering  patient's activity barriers and safety.   Exercise Prescription Goal: Initial exercise prescription builds to 30-45 minutes a day of aerobic activity, 2-3  days per week.  Home exercise guidelines will be given to patient during program as part of exercise prescription that the participant will acknowledge.  Activity Barriers & Risk Stratification:  Activity Barriers & Cardiac Risk Stratification - 11/28/23 1209       Activity Barriers & Cardiac Risk Stratification   Activity Barriers Deconditioning;Shortness of Breath;Decreased Ventricular Function;Balance Concerns;Neck/Spine Problems    Cardiac Risk Stratification High          6 Minute Walk:  6 Minute Walk     Row Name 11/28/23 1202         6 Minute Walk   Phase Initial     Distance 890 feet     Walk Time 6 minutes     # of Rest Breaks 1     MPH 1.69     METS 2.31     RPE 9     Perceived Dyspnea  1     VO2 Peak 8.08     Symptoms Yes (comment)     Comments standing rest break about 1/2 way about 30 seconds due to SOB and mild dizziness. Both resolved with the break and did not return     Resting HR 72 bpm     Resting BP 110/66     Resting Oxygen Saturation  98 %     Exercise Oxygen Saturation  during 6 min walk 96 %     Max Ex. HR 90 bpm     Max Ex. BP 120/80     2 Minute Post BP 110/78        Oxygen Initial Assessment:   Oxygen Re-Evaluation:   Oxygen Discharge (Final Oxygen Re-Evaluation):   Initial Exercise Prescription:  Initial Exercise Prescription - 11/28/23 1200       Date of Initial Exercise RX and Referring Provider   Date 11/28/23    Referring Provider Dr. Aleene Passe MD    Expected Discharge Date 02/17/24      NuStep   Level 1    SPM 70    Minutes 30    METs 1.8      Prescription Details   Frequency (times per week) 3    Duration Progress to 30 minutes of continuous aerobic without signs/symptoms of physical distress      Intensity   THRR 40-80% of Max Heartrate 60-121    Ratings of Perceived Exertion 11-13    Perceived Dyspnea 0-4      Progression   Progression Continue progressive overload as per policy without signs/symptoms  or physical distress.      Resistance Training   Training Prescription Yes    Weight 2    Reps 10-15          Perform Capillary Blood Glucose checks as needed.  Exercise Prescription Changes:   Exercise Prescription Changes     Row Name 12/02/23 1037 12/20/23 1028 01/06/24 1034         Response to Exercise   Blood Pressure (Admit) 96/60 100/66 102/62     Blood Pressure (Exercise) 102/60 130/74 --     Blood Pressure (Exit) 113/77 104/64 114/64     Heart Rate (Admit) 84 bpm 86 bpm 97 bpm     Heart Rate (Exercise) 106 bpm 106 bpm 134 bpm     Heart Rate (Exit) 79 bpm 82 bpm 105 bpm     Rating of Perceived Exertion (Exercise) 13 11 11      Symptoms None None None     Comments  Off to a good start with exercise. Reviewed home exercise guidelines and goals with Roseanne. --     Duration Continue with 30 min of aerobic exercise without signs/symptoms of physical distress. Continue with 30 min of aerobic exercise without signs/symptoms of physical distress. Continue with 30 min of aerobic exercise without signs/symptoms of physical distress.     Intensity THRR unchanged THRR unchanged THRR unchanged       Progression   Progression Continue to progress workloads to maintain intensity without signs/symptoms of physical distress. Continue to progress workloads to maintain intensity without signs/symptoms of physical distress. Continue to progress workloads to maintain intensity without signs/symptoms of physical distress.     Average METs 2.3 3.2 2.5       Resistance Training   Training Prescription Yes Yes Yes     Weight 2 lbs 2 lbs 2 lbs     Reps 10-15 10-15 10-15     Time 10 Minutes 10 Minutes 10 Minutes       Interval Training   Interval Training No No No       NuStep   Level 1 2 2      SPM 81 94 88     Minutes 30 30 30      METs 2.3 3.2 2.5       Home Exercise Plan   Plans to continue exercise at -- [employer] (comment)  Walking, gym at her residence [employer] (comment)  Walking, gym at her residence     Frequency -- Add 2 additional days to program exercise sessions. Add 2 additional days to program exercise sessions.     Initial Home Exercises Provided -- 12/20/23 12/20/23        Exercise Comments:   Exercise Comments     Row Name 12/02/23 1136 12/06/23 1105 12/20/23 1112 12/30/23 1117     Exercise Comments Mirielle tolerated low intensity exercise well without symptoms. Oriented her to the exercise equipment and stretching routine. Reviewed METs with Noralyn. Reviewed home exercise guidelines and goals with Roschelle. Reviewed METs with Daritza.       Exercise Goals and Review:   Exercise Goals     Row Name 11/28/23 1211             Exercise Goals   Increase Physical Activity Yes       Intervention Provide advice, education, support and counseling about physical activity/exercise needs.;Develop an individualized exercise prescription for aerobic and resistive training based on initial evaluation findings, risk stratification, comorbidities and participant's personal goals.       Expected Outcomes Short Term: Attend rehab on a regular basis to increase amount of physical activity.;Long Term: Exercising regularly at least 3-5 days a week.;Long Term: Add in home exercise to make exercise part of routine and to increase amount of physical activity.       Increase Strength and Stamina Yes       Intervention Provide advice, education, support and counseling about physical activity/exercise needs.;Develop an individualized exercise prescription for aerobic and resistive training based on initial evaluation findings, risk stratification, comorbidities and participant's personal goals.       Expected Outcomes Short Term: Increase workloads from initial exercise prescription for resistance, speed, and METs.;Long Term: Improve cardiorespiratory fitness, muscular endurance and strength as measured by increased METs and functional capacity  ( );Short Term: Perform resistance training exercises routinely during rehab and add in resistance training at home       Able to understand and use rate of  perceived exertion (RPE) scale Yes       Intervention Provide education and explanation on how to use RPE scale       Expected Outcomes Short Term: Able to use RPE daily in rehab to express subjective intensity level;Long Term:  Able to use RPE to guide intensity level when exercising independently       Knowledge and understanding of Target Heart Rate Range (THRR) Yes       Intervention Provide education and explanation of THRR including how the numbers were predicted and where they are located for reference       Expected Outcomes Short Term: Able to state/look up THRR;Long Term: Able to use THRR to govern intensity when exercising independently;Short Term: Able to use daily as guideline for intensity in rehab       Understanding of Exercise Prescription Yes       Intervention Provide education, explanation, and written materials on patient's individual exercise prescription       Expected Outcomes Short Term: Able to explain program exercise prescription;Long Term: Able to explain home exercise prescription to exercise independently          Exercise Goals Re-Evaluation :  Exercise Goals Re-Evaluation     Row Name 12/02/23 1136 12/20/23 1112           Exercise Goal Re-Evaluation   Exercise Goals Review Increase Physical Activity;Increase Strength and Stamina;Able to understand and use rate of perceived exertion (RPE) scale Increase Physical Activity;Increase Strength and Stamina;Able to understand and use rate of perceived exertion (RPE) scale;Understanding of Exercise Prescription;Knowledge and understanding of Target Heart Rate Range (THRR)      Comments Nirali was able to understand and use RPE scale appropriately. Reviewed exercise prescription with Xaria. She's walking and using the small gym at her residence. She is looking  into joining the [facility].      Expected Outcomes Progress workloads as tolerated to help improve cardiorespiratory fitness. Hetty will continue exercise 30 minutes at least 2 days/week in addition to exercise at cardiac rehab to achieve 150 minutes of aerobic exercise per week.         Discharge Exercise Prescription (Final Exercise Prescription Changes):  Exercise Prescription Changes - 01/06/24 1034       Response to Exercise   Blood Pressure (Admit) 102/62    Blood Pressure (Exit) 114/64    Heart Rate (Admit) 97 bpm    Heart Rate (Exercise) 134 bpm    Heart Rate (Exit) 105 bpm    Rating of Perceived Exertion (Exercise) 11    Symptoms None    Duration Continue with 30 min of aerobic exercise without signs/symptoms of physical distress.    Intensity THRR unchanged      Progression   Progression Continue to progress workloads to maintain intensity without signs/symptoms of physical distress.    Average METs 2.5      Resistance Training   Training Prescription Yes    Weight 2 lbs    Reps 10-15    Time 10 Minutes      Interval Training   Interval Training No      NuStep   Level 2    SPM 88    Minutes 30    METs 2.5      Home Exercise Plan   Plans to continue exercise at [employer] (comment)   Walking, gym at her residence   Frequency Add 2 additional days to program exercise sessions.    Initial Home Exercises Provided  12/20/23          Nutrition:  Target Goals: Understanding of nutrition guidelines, daily intake of sodium 1500mg , cholesterol 200mg , calories 30% from fat and 7% or less from saturated fats, daily to have 5 or more servings of fruits and vegetables.  Biometrics:  Pre Biometrics - 11/28/23 1200       Pre Biometrics   Height 5' 1 (1.549 m)    Waist Circumference 29.8 inches    Hip Circumference 32.5 inches    Waist to Hip Ratio 0.92 %    Triceps Skinfold 9 mm    % Body Fat 27.3 %    Grip Strength 12 kg    Flexibility 13.5 in    Single  Leg Stand 3 seconds           Nutrition Therapy Plan and Nutrition Goals:  Nutrition Therapy & Goals - 12/20/23 1058       Nutrition Therapy   Diet Heart healthy/Carbohydrat consistent diet    Drug/Food Interactions Statins/Certain Fruits      Personal Nutrition Goals   Nutrition Goal Patient to identify strategies for reducing cardiovascular risk by attending the [employer] education and nutrition series weekly.   goal in progress.   Personal Goal #2 Patient to improve diet quality by using the plate method as a guide for meal planning to include lean protein/plant protein, fruits, vegetables, whole grains, nonfat dairy as part of a well-balanced diet.   goal in progress.   Personal Goal #3 Patient to identify strategies for improving blood sugar with goal <7% A1c.   goal in progress.   Comments Goals in progress. Rayetta has medical history of CABGx2, HFrEF, HTN, Hyperlipidemia, TIA, DM2. A1c is not at goal. LDL is not at goal (treated with repatha , zetia , crestor ). She continues to work on weight gain; we have discussed multiple strategies for weight gain including increasing eating frequency, calorie density, nutrition supplements. She is up 5.1# since starting with our program. She does report her typical adult weight is ~126#. Patient will benefit from participation in intensive cardiac rehab for nutrition education, exercise, and lifestyle modification.      Intervention Plan   Intervention Prescribe, educate and counsel regarding individualized specific dietary modifications aiming towards targeted core components such as weight, hypertension, lipid management, diabetes, heart failure and other comorbidities.;Nutrition handout(s) given to patient.    Expected Outcomes Short Term Goal: Understand basic principles of dietary content, such as calories, fat, sodium, cholesterol and nutrients.;Long Term Goal: Adherence to prescribed nutrition plan.          Nutrition  Assessments:  MEDIFICTS Score Key: >=70 Need to make dietary changes  40-70 Heart Healthy Diet <= 40 Therapeutic Level Cholesterol Diet    Picture Your Plate Scores: <59 Unhealthy dietary pattern with much room for improvement. 41-50 Dietary pattern unlikely to meet recommendations for good health and room for improvement. 51-60 More healthful dietary pattern, with some room for improvement.  >60 Healthy dietary pattern, although there may be some specific behaviors that could be improved.    Nutrition Goals Re-Evaluation:  Nutrition Goals Re-Evaluation     Row Name 12/02/23 1102 12/20/23 1058           Goals   Current Weight 106 lb 0.7 oz (48.1 kg) 111 lb 1.8 oz (50.4 kg)      Comment A1c 7.3, cholesterol 251, triglycerides 193, LDL 157. BMI is low/normal for age. A1c 7.3, cholesterol 251, triglycerides 193, LDL 157. BMI is low/normal for  age.      Expected Outcome Lively has medical history of CABGx2, HFrEF, HTN, Hyperlipidemia, TIA, DM2. A1c is not at goal. LDL is not at goal (treated with repatha , zetia , crestor ). Patient will benefit from participation in intensive cardiac rehab for nutrition education, exercise, and lifestyle modification. Goals in progress. Kaisyn has medical history of CABGx2, HFrEF, HTN, Hyperlipidemia, TIA, DM2. A1c is not at goal. LDL is not at goal (treated with repatha , zetia , crestor ). She continues to work on weight gain; we have discussed multiple strategies for weight gain including increasing eating frequency, calorie density, nutrition supplements. She is up 5.1# since starting with our program. She does report her typical adult weight is ~126#. Patient will benefit from participation in intensive cardiac rehab for nutrition education, exercise, and lifestyle modification.         Nutrition Goals Re-Evaluation:  Nutrition Goals Re-Evaluation     Row Name 12/02/23 1102 12/20/23 1058           Goals   Current Weight 106 lb 0.7 oz (48.1 kg)  111 lb 1.8 oz (50.4 kg)      Comment A1c 7.3, cholesterol 251, triglycerides 193, LDL 157. BMI is low/normal for age. A1c 7.3, cholesterol 251, triglycerides 193, LDL 157. BMI is low/normal for age.      Expected Outcome Brodi has medical history of CABGx2, HFrEF, HTN, Hyperlipidemia, TIA, DM2. A1c is not at goal. LDL is not at goal (treated with repatha , zetia , crestor ). Patient will benefit from participation in intensive cardiac rehab for nutrition education, exercise, and lifestyle modification. Goals in progress. Maila has medical history of CABGx2, HFrEF, HTN, Hyperlipidemia, TIA, DM2. A1c is not at goal. LDL is not at goal (treated with repatha , zetia , crestor ). She continues to work on weight gain; we have discussed multiple strategies for weight gain including increasing eating frequency, calorie density, nutrition supplements. She is up 5.1# since starting with our program. She does report her typical adult weight is ~126#. Patient will benefit from participation in intensive cardiac rehab for nutrition education, exercise, and lifestyle modification.         Nutrition Goals Discharge (Final Nutrition Goals Re-Evaluation):  Nutrition Goals Re-Evaluation - 12/20/23 1058       Goals   Current Weight 111 lb 1.8 oz (50.4 kg)    Comment A1c 7.3, cholesterol 251, triglycerides 193, LDL 157. BMI is low/normal for age.    Expected Outcome Goals in progress. Lanier has medical history of CABGx2, HFrEF, HTN, Hyperlipidemia, TIA, DM2. A1c is not at goal. LDL is not at goal (treated with repatha , zetia , crestor ). She continues to work on weight gain; we have discussed multiple strategies for weight gain including increasing eating frequency, calorie density, nutrition supplements. She is up 5.1# since starting with our program. She does report her typical adult weight is ~126#. Patient will benefit from participation in intensive cardiac rehab for nutrition education, exercise, and lifestyle  modification.          Psychosocial: Target Goals: Acknowledge presence or absence of significant depression and/or stress, maximize coping skills, provide positive support system. Participant is able to verbalize types and ability to use techniques and skills needed for reducing stress and depression.  Initial Review & Psychosocial Screening:  Initial Psych Review & Screening - 11/28/23 1212       Initial Review   Current issues with Current Anxiety/Panic;Current Stress Concerns    Source of Stress Concerns Family;Financial    Comments Pt does have some mild  stress concerns, but has good family support with her niece, sister, and daughter. She feels her stress is controlled and has no additional needs at this time      Family Dynamics   Good Support System? Yes    Concerns Recent loss of significant other    Comments Mother and brother recently      Barriers   Psychosocial barriers to participate in program The patient should benefit from training in stress management and relaxation.      Screening Interventions   Interventions Encouraged to exercise;To provide support and resources with identified psychosocial needs;Provide feedback about the scores to participant    Expected Outcomes Short Term goal: Utilizing psychosocial counselor, staff and physician to assist with identification of specific Stressors or current issues interfering with healing process. Setting desired goal for each stressor or current issue identified.;Long Term Goal: Stressors or current issues are controlled or eliminated.;Short Term goal: Identification and review with participant of any Quality of Life or Depression concerns found by scoring the questionnaire.;Long Term goal: The participant improves quality of Life and PHQ9 Scores as seen by post scores and/or verbalization of changes          Quality of Life Scores:  Quality of Life - 11/28/23 1216       Quality of Life   Select Quality of Life       Quality of Life Scores   Health/Function Pre 24.92 %    Socioeconomic Pre 25.71 %    Psych/Spiritual Pre 30 %    Family Pre 30 %    GLOBAL Pre 26.9 %         Scores of 19 and below usually indicate a poorer quality of life in these areas.  A difference of  2-3 points is a clinically meaningful difference.  A difference of 2-3 points in the total score of the Quality of Life Index has been associated with significant improvement in overall quality of life, self-image, physical symptoms, and general health in studies assessing change in quality of life.  PHQ-9: Review Flowsheet  More data exists      12/11/2023 11/28/2023 11/08/2023 05/02/2023 12/10/2022  Depression screen PHQ 2/9  Decreased Interest 0 0 0 0 0 0  Down, Depressed, Hopeless 0 0 0 1 0 0  PHQ - 2 Score 0 0 0 1 0 0  Altered sleeping - 1 - 0 0 -  Tired, decreased energy - 1 - 1 0 -  Change in appetite - 1 - 0 1 -  Feeling bad or failure about yourself  - 0 - 0 0 -  Trouble concentrating - 0 - 0 0 -  Moving slowly or fidgety/restless - 0 - 0 0 -  Suicidal thoughts - 0 - 0 0 -  PHQ-9 Score - 3 - 2 1 -  Difficult doing work/chores Not difficult at all Not difficult at all - - -    Details       Multiple values from one day are sorted in reverse-chronological order        Interpretation of Total Score  Total Score Depression Severity:  1-4 = Minimal depression, 5-9 = Mild depression, 10-14 = Moderate depression, 15-19 = Moderately severe depression, 20-27 = Severe depression   Psychosocial Evaluation and Intervention:   Psychosocial Re-Evaluation:  Psychosocial Re-Evaluation     Row Name 12/02/23 1725 12/06/23 0930 12/31/23 1440         Psychosocial Re-Evaluation   Current issues with Current  Anxiety/Panic;Current Stress Concerns Current Anxiety/Panic;Current Stress Concerns Current Anxiety/Panic;Current Stress Concerns     Comments Rowynn did not voice any increased concerns or stressors on her first day of  exercise. 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 Discussed PHQ9. Discussed sleep hygiene. Lynix has had low energy is enjoying participating in cardiac rehab. Melba says her appetite has improved. Shalina wants to gain weight.     Expected Outcomes Tanayah will have controlled or decreased stressors upon completion of cardiac rehab Luisa will have controlled or decreased stressors upon completion of cardiac rehab Elliott will have controlled or decreased stressors upon completion of cardiac rehab     Interventions Stress management education;Relaxation education;Encouraged to attend Cardiac Rehabilitation for the exercise Stress management education;Relaxation education;Encouraged to attend Cardiac Rehabilitation for the exercise Stress management education;Relaxation education;Encouraged to attend Cardiac Rehabilitation for the exercise     Continue Psychosocial Services  Follow up required by staff Follow up required by staff Follow up required by staff       Initial Review   Source of Stress Concerns Chronic Illness;Financial;Family Chronic Illness;Financial;Family Chronic Illness;Financial;Family     Comments Will continue to monitor and offer support as needed. Will continue to monitor and offer support as needed. Will continue to monitor and offer support as needed.        Psychosocial Discharge (Final Psychosocial Re-Evaluation):  Psychosocial Re-Evaluation - 12/31/23 1440       Psychosocial Re-Evaluation   Current issues with Current Anxiety/Panic;Current Stress Concerns    Comments Discussed PHQ9. Discussed sleep hygiene. Marybel has had low energy is enjoying participating in cardiac rehab. Nasia says her appetite has improved. Alorah wants to gain weight.    Expected Outcomes Francheska will have controlled or decreased stressors upon completion of cardiac rehab    Interventions  Stress management education;Relaxation education;Encouraged to attend Cardiac Rehabilitation for the exercise    Continue Psychosocial Services  Follow up required by staff      Initial Review   Source of Stress Concerns Chronic Illness;Financial;Family    Comments Will continue to monitor and offer support as needed.          Vocational Rehabilitation: Provide vocational rehab assistance to qualifying candidates.   Vocational Rehab Evaluation & Intervention:  Vocational Rehab - 11/28/23 1222       Initial Vocational Rehab Evaluation & Intervention   Assessment shows need for Vocational Rehabilitation No   She has her return to work papers and has no additional needs at this time         Education: Education Goals: Education classes will be provided on a weekly basis, covering required topics. Participant will state understanding/return demonstration of topics presented.    Education     Row Name 12/02/23 1300     Education   Cardiac Education Topics Pritikin   Glass blower/designer Nutrition   Nutrition Workshop Targeting Your Nutrition Priorities   Instruction Review Code 1- Verbalizes Understanding   Class Start Time 1150   Class Stop Time 1230   Class Time Calculation (min) 40 min    Row Name 12/06/23 1200     Education   Cardiac Education Topics Pritikin   Hospital doctor Education   General Education Hypertension and Heart  Disease   Instruction Review Code 1- Verbalizes Understanding   Class Start Time 1155   Class Stop Time 1232   Class Time Calculation (min) 37 min    Row Name 12/09/23 1200     Education   Cardiac Education Topics Pritikin   Immunologist Exercise Physiologist   [facility] Psychosocial   Psychosocial Workshop Focused Goals, Sustainable Changes   Instruction Review Code 1- Verbalizes  Understanding   Class Start Time 1155   Class Stop Time 1235   Class Time Calculation (min) 40 min    Row Name 12/16/23 1100     Education   Cardiac Education Topics Pritikin   Psychologist, forensic Exercise Education   Exercise Education Biomechanial Limitations   Instruction Review Code 1- Verbalizes Understanding   Class Start Time 1155   Class Stop Time 1235   Class Time Calculation (min) 40 min    Row Name 12/18/23 1300     Education   Cardiac Education Topics Pritikin   Customer service manager   Weekly Topic Fast Evening Meals   Instruction Review Code 1- Verbalizes Understanding   Class Start Time 1145   Class Stop Time 1225   Class Time Calculation (min) 40 min    Row Name 12/20/23 1100     Education   Cardiac Education Topics Pritikin   Licensed conveyancer Nutrition   Nutrition Vitamins and Minerals   Instruction Review Code 1- Verbalizes Understanding   Class Start Time 1145   Class Stop Time 1225   Class Time Calculation (min) 40 min    Row Name 12/23/23 1600     Education   Cardiac Education Topics Pritikin   Musician   Nutrition Workshop Fueling a Forensic psychologist   Instruction Review Code 1- Tax inspector   Class Start Time 1145   Class Stop Time 1230   Class Time Calculation (min) 45 min    Row Name 01/06/24 1100     Education   Cardiac Education Topics Pritikin   Hospital doctor Education   General Education Hypertension and Heart Disease   Instruction Review Code 1- Verbalizes Understanding   Class Start Time 1150   Class Stop Time 1225   Class Time Calculation (min) 35 min      Core Videos: Exercise    Move It!  Clinical staff conducted group or individual video  education with verbal and written material and guidebook.  Patient learns the recommended Pritikin exercise program. Exercise with the goal of living a long, healthy life. Some of the health benefits of exercise include controlled diabetes, healthier blood pressure levels, improved cholesterol levels, improved heart and lung capacity, improved sleep, and better body composition. Everyone should speak with their doctor before starting or changing an exercise routine.  Biomechanical Limitations Clinical staff conducted group or individual video education with verbal and written material and guidebook.  Patient learns how biomechanical limitations can impact exercise and how we can mitigate and possibly overcome limitations to have an impactful and balanced exercise routine.  Body Composition Clinical staff conducted group or individual video education  with verbal and written material and guidebook.  Patient learns that body composition (ratio of muscle mass to fat mass) is a key component to assessing overall fitness, rather than body weight alone. Increased fat mass, especially visceral belly fat, can put us  at increased risk for metabolic syndrome, type 2 diabetes, heart disease, and even death. It is recommended to combine diet and exercise (cardiovascular and resistance training) to improve your body composition. Seek guidance from your physician and exercise physiologist before implementing an exercise routine.  Exercise Action Plan Clinical staff conducted group or individual video education with verbal and written material and guidebook.  Patient learns the recommended strategies to achieve and enjoy long-term exercise adherence, including variety, self-motivation, self-efficacy, and positive decision making. Benefits of exercise include fitness, good health, weight management, more energy, better sleep, less stress, and overall well-being.  Medical   Heart Disease Risk Reduction Clinical staff  conducted group or individual video education with verbal and written material and guidebook.  Patient learns our heart is our most vital organ as it circulates oxygen, nutrients, white blood cells, and hormones throughout the entire body, and carries waste away. Data supports a plant-based eating plan like the Pritikin Program for its effectiveness in slowing progression of and reversing heart disease. The video provides a number of recommendations to address heart disease.   Metabolic Syndrome and Belly Fat  Clinical staff conducted group or individual video education with verbal and written material and guidebook.  Patient learns what metabolic syndrome is, how it leads to heart disease, and how one can reverse it and keep it from coming back. You have metabolic syndrome if you have 3 of the following 5 criteria: abdominal obesity, high blood pressure, high triglycerides, low HDL cholesterol, and high blood sugar.  Hypertension and Heart Disease Clinical staff conducted group or individual video education with verbal and written material and guidebook.  Patient learns that high blood pressure, or hypertension, is very common in the [place] . Hypertension is largely due to excessive salt intake, but other important risk factors include being overweight, physical inactivity, drinking too much alcohol, smoking, and not eating enough potassium from fruits and vegetables. High blood pressure is a leading risk factor for heart attack, stroke, congestive heart failure, dementia, kidney failure, and premature death. Long-term effects of excessive salt intake include stiffening of the arteries and thickening of heart muscle and organ damage. Recommendations include ways to reduce hypertension and the risk of heart disease.  Diseases of Our Time - Focusing on Diabetes Clinical staff conducted group or individual video education with verbal and written material and guidebook.  Patient learns why the best  way to stop diseases of our time is prevention, through food and other lifestyle changes. Medicine (such as prescription pills and surgeries) is often only a Band-Aid on the problem, not a long-term solution. Most common diseases of our time include obesity, type 2 diabetes, hypertension, heart disease, and cancer. The Pritikin Program is recommended and has been proven to help reduce, reverse, and/or prevent the damaging effects of metabolic syndrome.  Nutrition   Overview of the Pritikin Eating Plan  Clinical staff conducted group or individual video education with verbal and written material and guidebook.  Patient learns about the Pritikin Eating Plan for disease risk reduction. The Pritikin Eating Plan emphasizes a wide variety of unrefined, minimally-processed carbohydrates, like fruits, vegetables, whole grains, and legumes. Go, Caution, and Stop food choices are explained. Plant-based and lean animal proteins are emphasized. Rationale provided  for low sodium intake for blood pressure control, low added sugars for blood sugar stabilization, and low added fats and oils for coronary artery disease risk reduction and weight management.  Calorie Density  Clinical staff conducted group or individual video education with verbal and written material and guidebook.  Patient learns about calorie density and how it impacts the Pritikin Eating Plan. Knowing the characteristics of the food you choose will help you decide whether those foods will lead to weight gain or weight loss, and whether you want to consume more or less of them. Weight loss is usually a side effect of the Pritikin Eating Plan because of its focus on low calorie-dense foods.  Label Reading  Clinical staff conducted group or individual video education with verbal and written material and guidebook.  Patient learns about the Pritikin recommended label reading guidelines and corresponding recommendations regarding calorie density, added  sugars, sodium content, and whole grains.  Dining Out - Part 1  Clinical staff conducted group or individual video education with verbal and written material and guidebook.  Patient learns that restaurant meals can be sabotaging because they can be so high in calories, fat, sodium, and/or sugar. Patient learns recommended strategies on how to positively address this and avoid unhealthy pitfalls.  Facts on Fats  Clinical staff conducted group or individual video education with verbal and written material and guidebook.  Patient learns that lifestyle modifications can be just as effective, if not more so, as many medications for lowering your risk of heart disease. A Pritikin lifestyle can help to reduce your risk of inflammation and atherosclerosis (cholesterol build-up, or plaque, in the artery walls). Lifestyle interventions such as dietary choices and physical activity address the cause of atherosclerosis. A review of the types of fats and their impact on blood cholesterol levels, along with dietary recommendations to reduce fat intake is also included.  Nutrition Action Plan  Clinical staff conducted group or individual video education with verbal and written material and guidebook.  Patient learns how to incorporate Pritikin recommendations into their lifestyle. Recommendations include planning and keeping personal health goals in mind as an important part of their success.  Healthy Mind-Set    Healthy Minds, Bodies, Hearts  Clinical staff conducted group or individual video education with verbal and written material and guidebook.  Patient learns how to identify when they are stressed. Video will discuss the impact of that stress, as well as the many benefits of stress management. Patient will also be introduced to stress management techniques. The way we think, act, and feel has an impact on our hearts.  How Our Thoughts Can Heal Our Hearts  Clinical staff conducted group or individual  video education with verbal and written material and guidebook.  Patient learns that negative thoughts can cause depression and anxiety. This can result in negative lifestyle behavior and serious health problems. Cognitive behavioral therapy is an effective method to help control our thoughts in order to change and improve our emotional outlook.  Additional Videos:  Exercise    Improving Performance  Clinical staff conducted group or individual video education with verbal and written material and guidebook.  Patient learns to use a non-linear approach by alternating intensity levels and lengths of time spent exercising to help burn more calories and lose more body fat. Cardiovascular exercise helps improve heart health, metabolism, hormonal balance, blood sugar control, and recovery from fatigue. Resistance training improves strength, endurance, balance, coordination, reaction time, metabolism, and muscle mass. Flexibility exercise improves circulation,  posture, and balance. Seek guidance from your physician and exercise physiologist before implementing an exercise routine and learn your capabilities and proper form for all exercise.  Introduction to Yoga  Clinical staff conducted group or individual video education with verbal and written material and guidebook.  Patient learns about yoga, a discipline of the coming together of mind, breath, and body. The benefits of yoga include improved flexibility, improved range of motion, better posture and core strength, increased lung function, weight loss, and positive self-image. Yoga's heart health benefits include lowered blood pressure, healthier heart rate, decreased cholesterol and triglyceride levels, improved immune function, and reduced stress. Seek guidance from your physician and exercise physiologist before implementing an exercise routine and learn your capabilities and proper form for all exercise.  Medical   Aging: Enhancing Your Quality of  Life  Clinical staff conducted group or individual video education with verbal and written material and guidebook.  Patient learns key strategies and recommendations to stay in good physical health and enhance quality of life, such as prevention strategies, having an advocate, securing a Health Care Proxy and Power of Attorney, and keeping a list of medications and system for tracking them. It also discusses how to avoid risk for bone loss.  Biology of Weight Control  Clinical staff conducted group or individual video education with verbal and written material and guidebook.  Patient learns that weight gain occurs because we consume more calories than we burn (eating more, moving less). Even if your body weight is normal, you may have higher ratios of fat compared to muscle mass. Too much body fat puts you at increased risk for cardiovascular disease, heart attack, stroke, type 2 diabetes, and obesity-related cancers. In addition to exercise, following the Pritikin Eating Plan can help reduce your risk.  Decoding Lab Results  Clinical staff conducted group or individual video education with verbal and written material and guidebook.  Patient learns that lab test reflects one measurement whose values change over time and are influenced by many factors, including medication, stress, sleep, exercise, food, hydration, pre-existing medical conditions, and more. It is recommended to use the knowledge from this video to become more involved with your lab results and evaluate your numbers to speak with your doctor.   Diseases of Our Time - Overview  Clinical staff conducted group or individual video education with verbal and written material and guidebook.  Patient learns that according to the CDC, 50% to 70% of chronic diseases (such as obesity, type 2 diabetes, elevated lipids, hypertension, and heart disease) are avoidable through lifestyle improvements including healthier food choices, listening to  satiety cues, and increased physical activity.  Sleep Disorders Clinical staff conducted group or individual video education with verbal and written material and guidebook.  Patient learns how good quality and duration of sleep are important to overall health and well-being. Patient also learns about sleep disorders and how they impact health along with recommendations to address them, including discussing with a physician.  Nutrition  Dining Out - Part 2 Clinical staff conducted group or individual video education with verbal and written material and guidebook.  Patient learns how to plan ahead and communicate in order to maximize their dining experience in a healthy and nutritious manner. Included are recommended food choices based on the type of restaurant the patient is visiting.   Fueling a Banker conducted group or individual video education with verbal and written material and guidebook.  There is a strong connection between our  food choices and our health. Diseases like obesity and type 2 diabetes are very prevalent and are in large-part due to lifestyle choices. The Pritikin Eating Plan provides plenty of food and hunger-curbing satisfaction. It is easy to follow, affordable, and helps reduce health risks.  Menu Workshop  Clinical staff conducted group or individual video education with verbal and written material and guidebook.  Patient learns that restaurant meals can sabotage health goals because they are often packed with calories, fat, sodium, and sugar. Recommendations include strategies to plan ahead and to communicate with the manager, chef, or server to help order a healthier meal.  Planning Your Eating Strategy  Clinical staff conducted group or individual video education with verbal and written material and guidebook.  Patient learns about the Pritikin Eating Plan and its benefit of reducing the risk of disease. The Pritikin Eating Plan does not focus on  calories. Instead, it emphasizes high-quality, nutrient-rich foods. By knowing the characteristics of the foods, we choose, we can determine their calorie density and make informed decisions.  Targeting Your Nutrition Priorities  Clinical staff conducted group or individual video education with verbal and written material and guidebook.  Patient learns that lifestyle habits have a tremendous impact on disease risk and progression. This video provides eating and physical activity recommendations based on your personal health goals, such as reducing LDL cholesterol, losing weight, preventing or controlling type 2 diabetes, and reducing high blood pressure.  Vitamins and Minerals  Clinical staff conducted group or individual video education with verbal and written material and guidebook.  Patient learns different ways to obtain key vitamins and minerals, including through a recommended healthy diet. It is important to discuss all supplements you take with your doctor.   Healthy Mind-Set    Smoking Cessation  Clinical staff conducted group or individual video education with verbal and written material and guidebook.  Patient learns that cigarette smoking and tobacco addiction pose a serious health risk which affects millions of people. Stopping smoking will significantly reduce the risk of heart disease, lung disease, and many forms of cancer. Recommended strategies for quitting are covered, including working with your doctor to develop a successful plan.  Culinary   Becoming a Set designer conducted group or individual video education with verbal and written material and guidebook.  Patient learns that cooking at home can be healthy, cost-effective, quick, and puts them in control. Keys to cooking healthy recipes will include looking at your recipe, assessing your equipment needs, planning ahead, making it simple, choosing cost-effective seasonal ingredients, and limiting the use of  added fats, salts, and sugars.  Cooking - Breakfast and Snacks  Clinical staff conducted group or individual video education with verbal and written material and guidebook.  Patient learns how important breakfast is to satiety and nutrition through the entire day. Recommendations include key foods to eat during breakfast to help stabilize blood sugar levels and to prevent overeating at meals later in the day. Planning ahead is also a key component.  Cooking - Educational psychologist conducted group or individual video education with verbal and written material and guidebook.  Patient learns eating strategies to improve overall health, including an approach to cook more at home. Recommendations include thinking of animal protein as a side on your plate rather than center stage and focusing instead on lower calorie dense options like vegetables, fruits, whole grains, and plant-based proteins, such as beans. Making sauces in large quantities to freeze for later  and leaving the skin on your vegetables are also recommended to maximize your experience.  Cooking - Healthy Salads and Dressing Clinical staff conducted group or individual video education with verbal and written material and guidebook.  Patient learns that vegetables, fruits, whole grains, and legumes are the foundations of the Pritikin Eating Plan. Recommendations include how to incorporate each of these in flavorful and healthy salads, and how to create homemade salad dressings. Proper handling of ingredients is also covered. Cooking - Soups and [employer] - Soups and Desserts Clinical staff conducted group or individual video education with verbal and written material and guidebook.  Patient learns that Pritikin soups and desserts make for easy, nutritious, and delicious snacks and meal components that are low in sodium, fat, sugar, and calorie density, while high in vitamins, minerals, and filling fiber. Recommendations  include simple and healthy ideas for soups and desserts.   Overview     The Pritikin Solution Program Overview Clinical staff conducted group or individual video education with verbal and written material and guidebook.  Patient learns that the results of the Pritikin Program have been documented in more than 100 articles published in peer-reviewed journals, and the benefits include reducing risk factors for (and, in some cases, even reversing) high cholesterol, high blood pressure, type 2 diabetes, obesity, and more! An overview of the three key pillars of the Pritikin Program will be covered: eating well, doing regular exercise, and having a healthy mind-set.  WORKSHOPS  Exercise: Exercise Basics: Building Your Action Plan Clinical staff led group instruction and group discussion with PowerPoint presentation and patient guidebook. To enhance the learning environment the use of posters, models and videos may be added. At the conclusion of this workshop, patients will comprehend the difference between physical activity and exercise, as well as the benefits of incorporating both, into their routine. Patients will understand the FITT (Frequency, Intensity, Time, and Type) principle and how to use it to build an exercise action plan. In addition, safety concerns and other considerations for exercise and cardiac rehab will be addressed by the presenter. The purpose of this lesson is to promote a comprehensive and effective weekly exercise routine in order to improve patients' overall level of fitness.   Managing Heart Disease: Your Path to a Healthier Heart Clinical staff led group instruction and group discussion with PowerPoint presentation and patient guidebook. To enhance the learning environment the use of posters, models and videos may be added.At the conclusion of this workshop, patients will understand the anatomy and physiology of the heart. Additionally, they will understand how  Pritikin's three pillars impact the risk factors, the progression, and the management of heart disease.  The purpose of this lesson is to provide a high-level overview of the heart, heart disease, and how the Pritikin lifestyle positively impacts risk factors.  Exercise Biomechanics Clinical staff led group instruction and group discussion with PowerPoint presentation and patient guidebook. To enhance the learning environment the use of posters, models and videos may be added. Patients will learn how the structural parts of their bodies function and how these functions impact their daily activities, movement, and exercise. Patients will learn how to promote a neutral spine, learn how to manage pain, and identify ways to improve their physical movement in order to promote healthy living. The purpose of this lesson is to expose patients to common physical limitations that impact physical activity. Participants will learn practical ways to adapt and manage aches and pains, and to minimize their effect  on regular exercise. Patients will learn how to maintain good posture while sitting, walking, and lifting.  Balance Training and Fall Prevention  Clinical staff led group instruction and group discussion with PowerPoint presentation and patient guidebook. To enhance the learning environment the use of posters, models and videos may be added. At the conclusion of this workshop, patients will understand the importance of their sensorimotor skills (vision, proprioception, and the vestibular system) in maintaining their ability to balance as they age. Patients will apply a variety of balancing exercises that are appropriate for their current level of function. Patients will understand the common causes for poor balance, possible solutions to these problems, and ways to modify their physical environment in order to minimize their fall risk. The purpose of this lesson is to teach patients about the  importance of maintaining balance as they age and ways to minimize their risk of falling.  WORKSHOPS   Nutrition:  Fueling a Ship broker led group instruction and group discussion with PowerPoint presentation and patient guidebook. To enhance the learning environment the use of posters, models and videos may be added. Patients will review the foundational principles of the Pritikin Eating Plan and understand what constitutes a serving size in each of the food groups. Patients will also learn Pritikin-friendly foods that are better choices when away from home and review make-ahead meal and snack options. Calorie density will be reviewed and applied to three nutrition priorities: weight maintenance, weight loss, and weight gain. The purpose of this lesson is to reinforce (in a group setting) the key concepts around what patients are recommended to eat and how to apply these guidelines when away from home by planning and selecting Pritikin-friendly options. Patients will understand how calorie density may be adjusted for different weight management goals.  Mindful Eating  Clinical staff led group instruction and group discussion with PowerPoint presentation and patient guidebook. To enhance the learning environment the use of posters, models and videos may be added. Patients will briefly review the concepts of the Pritikin Eating Plan and the importance of low-calorie dense foods. The concept of mindful eating will be introduced as well as the importance of paying attention to internal hunger signals. Triggers for non-hunger eating and techniques for dealing with triggers will be explored. The purpose of this lesson is to provide patients with the opportunity to review the basic principles of the Pritikin Eating Plan, discuss the value of eating mindfully and how to measure internal cues of hunger and fullness using the Hunger Scale. Patients will also discuss reasons for non-hunger eating and  learn strategies to use for controlling emotional eating.  Targeting Your Nutrition Priorities Clinical staff led group instruction and group discussion with PowerPoint presentation and patient guidebook. To enhance the learning environment the use of posters, models and videos may be added. Patients will learn how to determine their genetic susceptibility to disease by reviewing their family history. Patients will gain insight into the importance of diet as part of an overall healthy lifestyle in mitigating the impact of genetics and other environmental insults. The purpose of this lesson is to provide patients with the opportunity to assess their personal nutrition priorities by looking at their family history, their own health history and current risk factors. Patients will also be able to discuss ways of prioritizing and modifying the Pritikin Eating Plan for their highest risk areas  Menu  Clinical staff led group instruction and group discussion with PowerPoint presentation and patient guidebook. To enhance  the learning environment the use of posters, models and videos may be added. Using menus brought in from [employer], or printed from [employer], patients will apply the Pritikin dining out guidelines that were presented in the [employer] video. Patients will also be able to practice these guidelines in a variety of provided scenarios. The purpose of this lesson is to provide patients with the opportunity to practice hands-on learning of the Pritikin Dining Out guidelines with actual menus and practice scenarios.  Label Reading Clinical staff led group instruction and group discussion with PowerPoint presentation and patient guidebook. To enhance the learning environment the use of posters, models and videos may be added. Patients will review and discuss the Pritikin label reading guidelines presented in Pritikin's Label Reading Educational series video. Using fool  labels brought in from local grocery stores and markets, patients will apply the label reading guidelines and determine if the packaged food meet the Pritikin guidelines. The purpose of this lesson is to provide patients with the opportunity to review, discuss, and practice hands-on learning of the Pritikin Label Reading guidelines with actual packaged food labels. Cooking School  Pritikin's [employer] are designed to teach patients ways to prepare quick, simple, and affordable recipes at home. The importance of nutrition's role in chronic disease risk reduction is reflected in its emphasis in the overall Pritikin program. By learning how to prepare essential core Pritikin Eating Plan recipes, patients will increase control over what they eat; be able to customize the flavor of foods without the use of added salt, sugar, or fat; and improve the quality of the food they consume. By learning a set of core recipes which are easily assembled, quickly prepared, and affordable, patients are more likely to prepare more healthy foods at home. These workshops focus on convenient breakfasts, simple entres, side dishes, and desserts which can be prepared with minimal effort and are consistent with nutrition recommendations for cardiovascular risk reduction. Cooking [employer] are taught by a Armed forces logistics/support/administrative officer (RD) who has been trained by the [employer]. The chef or RD has a clear understanding of the importance of minimizing - if not completely eliminating - added fat, sugar, and sodium in recipes. Throughout the series of Cooking School Workshop sessions, patients will learn about healthy ingredients and efficient methods of cooking to build confidence in their capability to prepare    Cooking School weekly topics:  Adding Flavor- Sodium-Free  Fast and Healthy Breakfasts  Powerhouse Plant-Based Proteins  Satisfying Salads and Dressings  Simple Sides and  Sauces  International Cuisine-Spotlight on the [employer] Zones  Delicious Desserts  Savory Soups  [employer] - Meals in a Astronomer Appetizers and Snacks  Comforting Weekend Breakfasts  One-Pot Wonders   Fast Evening Meals  Landscape architect Your Pritikin Plate  WORKSHOPS   Healthy Mindset (Psychosocial):  Focused Goals, Sustainable Changes Clinical staff led group instruction and group discussion with PowerPoint presentation and patient guidebook. To enhance the learning environment the use of posters, models and videos may be added. Patients will be able to apply effective goal setting strategies to establish at least one personal goal, and then take consistent, meaningful action toward that goal. They will learn to identify common barriers to achieving personal goals and develop strategies to overcome them. Patients will also gain an understanding of how our mind-set can impact our ability to achieve goals and the importance of cultivating a positive and growth-oriented mind-set.  The purpose of this lesson is to provide patients with a deeper understanding of how to set and achieve personal goals, as well as the tools and strategies needed to overcome common obstacles which may arise along the way.  From Head to Heart: The Power of a Healthy Outlook  Clinical staff led group instruction and group discussion with PowerPoint presentation and patient guidebook. To enhance the learning environment the use of posters, models and videos may be added. Patients will be able to recognize and describe the impact of emotions and mood on physical health. They will discover the importance of self-care and explore self-care practices which may work for them. Patients will also learn how to utilize the 4 C's to cultivate a healthier outlook and better manage stress and challenges. The purpose of this lesson is to demonstrate to patients how a healthy outlook is an essential part of  maintaining good health, especially as they continue their cardiac rehab journey.  Healthy Sleep for a Healthy Heart Clinical staff led group instruction and group discussion with PowerPoint presentation and patient guidebook. To enhance the learning environment the use of posters, models and videos may be added. At the conclusion of this workshop, patients will be able to demonstrate knowledge of the importance of sleep to overall health, well-being, and quality of life. They will understand the symptoms of, and treatments for, common sleep disorders. Patients will also be able to identify daytime and nighttime behaviors which impact sleep, and they will be able to apply these tools to help manage sleep-related challenges. The purpose of this lesson is to provide patients with a general overview of sleep and outline the importance of quality sleep. Patients will learn about a few of the most common sleep disorders. Patients will also be introduced to the concept of "sleep hygiene," and discover ways to self-manage certain sleeping problems through simple daily behavior changes. Finally, the workshop will motivate patients by clarifying the links between quality sleep and their goals of heart-healthy living.   Recognizing and Reducing Stress Clinical staff led group instruction and group discussion with PowerPoint presentation and patient guidebook. To enhance the learning environment the use of posters, models and videos may be added. At the conclusion of this workshop, patients will be able to understand the types of stress reactions, differentiate between acute and chronic stress, and recognize the impact that chronic stress has on their health. They will also be able to apply different coping mechanisms, such as reframing negative self-talk. Patients will have the opportunity to practice a variety of stress management techniques, such as deep abdominal breathing, progressive muscle relaxation, and/or  guided imagery.  The purpose of this lesson is to educate patients on the role of stress in their lives and to provide healthy techniques for coping with it.  Learning Barriers/Preferences:  Learning Barriers/Preferences - 11/28/23 1219       Learning Barriers/Preferences   Learning Barriers Sight;Exercise Concerns   Pt has some issues with dizziness and unsteady feeling   Learning Preferences Audio;Computer/Internet;Group Instruction;Individual Instruction;Pictoral;Skilled Demonstration;Verbal Instruction;Video;Written Material          Education Topics:  Knowledge Questionnaire Score:  Knowledge Questionnaire Score - 11/28/23 1220       Knowledge Questionnaire Score   Pre Score 19/24          Core Components/Risk Factors/Patient Goals at Admission:  Personal Goals and Risk Factors at Admission - 11/28/23 1222       Core Components/Risk Factors/Patient Goals on Admission  Diabetes Yes    Intervention Provide education about signs/symptoms and action to take for hypo/hyperglycemia.;Provide education about proper nutrition, including hydration, and aerobic/resistive exercise prescription along with prescribed medications to achieve blood glucose in normal ranges: Fasting glucose 65-99 mg/dL    Expected Outcomes Short Term: Participant verbalizes understanding of the signs/symptoms and immediate care of hyper/hypoglycemia, proper foot care and importance of medication, aerobic/resistive exercise and nutrition plan for blood glucose control.;Long Term: Attainment of HbA1C < 7%.    Heart Failure Yes    Intervention Provide a combined exercise and nutrition program that is supplemented with education, support and counseling about heart failure. Directed toward relieving symptoms such as shortness of breath, decreased exercise tolerance, and extremity edema.    Expected Outcomes Improve functional capacity of life;Short term: Attendance in program 2-3 days a week with increased  exercise capacity. Reported lower sodium intake. Reported increased fruit and vegetable intake. Reports medication compliance.;Short term: Daily weights obtained and reported for increase. Utilizing diuretic protocols set by physician.;Long term: Adoption of self-care skills and reduction of barriers for early signs and symptoms recognition and intervention leading to self-care maintenance.    Hypertension Yes    Intervention Provide education on lifestyle modifcations including regular physical activity/exercise, weight management, moderate sodium restriction and increased consumption of fresh fruit, vegetables, and low fat dairy, alcohol moderation, and smoking cessation.;Monitor prescription use compliance.    Expected Outcomes Short Term: Continued assessment and intervention until BP is < 140/11mm HG in hypertensive participants. < 130/76mm HG in hypertensive participants with diabetes, heart failure or chronic kidney disease.;Long Term: Maintenance of blood pressure at goal levels.    Lipids Yes    Intervention Provide education and support for participant on nutrition & aerobic/resistive exercise along with prescribed medications to achieve LDL 70mg , HDL >40mg .    Expected Outcomes Short Term: Participant states understanding of desired cholesterol values and is compliant with medications prescribed. Participant is following exercise prescription and nutrition guidelines.;Long Term: Cholesterol controlled with medications as prescribed, with individualized exercise RX and with personalized nutrition plan. Value goals: LDL < 70mg , HDL > 40 mg.    Stress Yes    Intervention Offer individual and/or small group education and counseling on adjustment to heart disease, stress management and health-related lifestyle change. Teach and support self-help strategies.;Refer participants experiencing significant psychosocial distress to appropriate mental health specialists for further evaluation and treatment.  When possible, include family members and significant others in education/counseling sessions.    Expected Outcomes Short Term: Participant demonstrates changes in health-related behavior, relaxation and other stress management skills, ability to obtain effective social support, and compliance with psychotropic medications if prescribed.;Long Term: Emotional wellbeing is indicated by absence of clinically significant psychosocial distress or social isolation.    Personal Goal Other Yes    Personal Goal short term: know plan for home exercise. Long term: increase strength and energy    Intervention Will continue to monitor pt and progress WL as tolerated W/O sign or symptom    Expected Outcomes Pt will achieve her goals and increase strength          Core Components/Risk Factors/Patient Goals Review:   Goals and Risk Factor Review     Row Name 12/02/23 1728 12/06/23 0932 12/31/23 1444         Core Components/Risk Factors/Patient Goals Review   Personal Goals Review Weight Management/Obesity;Lipids;Hypertension;Stress;Diabetes;Heart Failure Weight Management/Obesity;Lipids;Hypertension;Stress;Diabetes;Heart Failure Weight Management/Obesity;Lipids;Hypertension;Stress;Diabetes;Heart Failure     Review Bettymae started cardiac rehab on 12/02/23. Tykiera did well with  exercise. Entry systolic BP in the 90's asymptomatic. Exertional and exit BP WNL. Will continue to monitor BP Clotilde started cardiac rehab on 12/02/23. Damica is off to a fair start with exercise. Will continue to monitor BP. It is noted the [facility] NP discontinued Tannya's amlodipine  due to low BP. Andee has been doing well with exercise at  cardiac rehab. Vital sings have been stable. Noelle has increased her met level. Tamrah has gained 2.8 kg as weight gain has been a goal for her.     Expected Outcomes Cynai will continue to particpate in cardiac rehab for exercise, nutrition and lifestyle modificaiotns Aneli  will continue to particpate in cardiac rehab for exercise, nutrition and lifestyle modificaiotns Keoni will continue to particpate in cardiac rehab for exercise, nutrition and lifestyle modificaiotns        Core Components/Risk Factors/Patient Goals at Discharge (Final Review):   Goals and Risk Factor Review - 12/31/23 1444       Core Components/Risk Factors/Patient Goals Review   Personal Goals Review Weight Management/Obesity;Lipids;Hypertension;Stress;Diabetes;Heart Failure    Review Davette has been doing well with exercise at  cardiac rehab. Vital sings have been stable. Larita has increased her met level. Iyania has gained 2.8 kg as weight gain has been a goal for her.    Expected Outcomes Artelia will continue to particpate in cardiac rehab for exercise, nutrition and lifestyle modificaiotns          ITP Comments:  ITP Comments     Row Name 11/28/23 9062 12/02/23 1723 12/06/23 0928 12/31/23 1435     ITP Comments Wilbert Bihari, MD: Medical Director. Introduction to the Pritikin Education Program /Intensive Cardiac Rehab.  Initial orientation packet reviewed with the patient. 30 Day ITP Review. Leilanie started cardiac rehab on 12/02/23. Robbi did well with exercise for her fitness level 30 Day ITP Review. Malkia started cardiac rehab on 12/02/23. Siomara is off to a fair start with exercise for her fitness level 30 Day ITP Review. Preston has good participation in cardiac rehab when in attendance.       Comments: See ITP Comments

## 2024-01-08 ENCOUNTER — Encounter (HOSPITAL_COMMUNITY)
Admission: RE | Admit: 2024-01-08 | Discharge: 2024-01-08 | Disposition: A | Discharge status: Home / Self Care | Source: Ambulatory Visit | Attending: Cardiovascular Disease | Admitting: Cardiovascular Disease

## 2024-01-08 DIAGNOSIS — Z48812 Encounter for surgical aftercare following surgery on the circulatory system: Secondary | ICD-10-CM | POA: Diagnosis not present

## 2024-01-08 DIAGNOSIS — Z7902 Long term (current) use of antithrombotics/antiplatelets: Secondary | ICD-10-CM | POA: Diagnosis not present

## 2024-01-08 DIAGNOSIS — Z951 Presence of aortocoronary bypass graft: Secondary | ICD-10-CM | POA: Diagnosis not present

## 2024-01-10 ENCOUNTER — Encounter (HOSPITAL_COMMUNITY)
Admission: RE | Admit: 2024-01-10 | Discharge: 2024-01-10 | Disposition: A | Discharge status: Home / Self Care | Source: Ambulatory Visit | Attending: Cardiovascular Disease | Admitting: Cardiovascular Disease

## 2024-01-10 DIAGNOSIS — Z951 Presence of aortocoronary bypass graft: Secondary | ICD-10-CM | POA: Diagnosis not present

## 2024-01-10 DIAGNOSIS — Z7902 Long term (current) use of antithrombotics/antiplatelets: Secondary | ICD-10-CM | POA: Diagnosis not present

## 2024-01-10 DIAGNOSIS — Z48812 Encounter for surgical aftercare following surgery on the circulatory system: Secondary | ICD-10-CM | POA: Diagnosis not present

## 2024-01-13 ENCOUNTER — Encounter (HOSPITAL_COMMUNITY)
Admission: RE | Admit: 2024-01-13 | Discharge: 2024-01-13 | Disposition: A | Discharge status: Home / Self Care | Source: Ambulatory Visit | Attending: Cardiovascular Disease | Admitting: Cardiovascular Disease

## 2024-01-13 DIAGNOSIS — Z951 Presence of aortocoronary bypass graft: Secondary | ICD-10-CM

## 2024-01-13 DIAGNOSIS — Z7902 Long term (current) use of antithrombotics/antiplatelets: Secondary | ICD-10-CM | POA: Diagnosis not present

## 2024-01-13 DIAGNOSIS — Z48812 Encounter for surgical aftercare following surgery on the circulatory system: Secondary | ICD-10-CM | POA: Diagnosis not present

## 2024-01-15 ENCOUNTER — Encounter (HOSPITAL_COMMUNITY)
Admission: RE | Admit: 2024-01-15 | Discharge: 2024-01-15 | Disposition: A | Discharge status: Home / Self Care | Source: Ambulatory Visit | Attending: Cardiovascular Disease | Admitting: Cardiovascular Disease

## 2024-01-15 DIAGNOSIS — Z48812 Encounter for surgical aftercare following surgery on the circulatory system: Secondary | ICD-10-CM | POA: Diagnosis not present

## 2024-01-15 DIAGNOSIS — Z7902 Long term (current) use of antithrombotics/antiplatelets: Secondary | ICD-10-CM | POA: Diagnosis not present

## 2024-01-15 DIAGNOSIS — Z951 Presence of aortocoronary bypass graft: Secondary | ICD-10-CM | POA: Diagnosis not present

## 2024-01-17 ENCOUNTER — Encounter (HOSPITAL_COMMUNITY)
Admission: RE | Admit: 2024-01-17 | Discharge: 2024-01-17 | Disposition: A | Discharge status: Home / Self Care | Source: Ambulatory Visit | Attending: Cardiovascular Disease | Admitting: Cardiovascular Disease

## 2024-01-17 DIAGNOSIS — Z951 Presence of aortocoronary bypass graft: Secondary | ICD-10-CM | POA: Diagnosis not present

## 2024-01-17 DIAGNOSIS — Z7902 Long term (current) use of antithrombotics/antiplatelets: Secondary | ICD-10-CM | POA: Diagnosis not present

## 2024-01-17 DIAGNOSIS — Z48812 Encounter for surgical aftercare following surgery on the circulatory system: Secondary | ICD-10-CM | POA: Diagnosis not present

## 2024-01-20 ENCOUNTER — Encounter (HOSPITAL_COMMUNITY)
Admission: RE | Admit: 2024-01-20 | Discharge: 2024-01-20 | Disposition: A | Discharge status: Home / Self Care | Source: Ambulatory Visit | Attending: Cardiovascular Disease | Admitting: Cardiovascular Disease

## 2024-01-20 DIAGNOSIS — Z951 Presence of aortocoronary bypass graft: Secondary | ICD-10-CM | POA: Diagnosis not present

## 2024-01-20 DIAGNOSIS — Z48812 Encounter for surgical aftercare following surgery on the circulatory system: Secondary | ICD-10-CM | POA: Diagnosis not present

## 2024-01-20 DIAGNOSIS — Z7902 Long term (current) use of antithrombotics/antiplatelets: Secondary | ICD-10-CM | POA: Diagnosis not present

## 2024-01-22 ENCOUNTER — Encounter (HOSPITAL_COMMUNITY)
Admission: RE | Admit: 2024-01-22 | Discharge: 2024-01-22 | Disposition: A | Discharge status: Home / Self Care | Source: Ambulatory Visit | Attending: Cardiovascular Disease | Admitting: Cardiovascular Disease

## 2024-01-22 DIAGNOSIS — Z951 Presence of aortocoronary bypass graft: Secondary | ICD-10-CM | POA: Diagnosis not present

## 2024-01-22 DIAGNOSIS — Z48812 Encounter for surgical aftercare following surgery on the circulatory system: Secondary | ICD-10-CM | POA: Diagnosis not present

## 2024-01-22 DIAGNOSIS — Z7902 Long term (current) use of antithrombotics/antiplatelets: Secondary | ICD-10-CM | POA: Diagnosis not present

## 2024-01-23 ENCOUNTER — Ambulatory Visit: Attending: Emergency Medicine | Admitting: Emergency Medicine

## 2024-01-23 ENCOUNTER — Encounter: Payer: Self-pay | Admitting: Emergency Medicine

## 2024-01-23 VITALS — BP 100/80 | HR 98 | Ht 61.0 in | Wt 112.0 lb

## 2024-01-23 DIAGNOSIS — R000 Tachycardia, unspecified: Secondary | ICD-10-CM | POA: Diagnosis not present

## 2024-01-23 DIAGNOSIS — E782 Mixed hyperlipidemia: Secondary | ICD-10-CM

## 2024-01-23 DIAGNOSIS — I10 Essential (primary) hypertension: Secondary | ICD-10-CM

## 2024-01-23 DIAGNOSIS — I5022 Chronic systolic (congestive) heart failure: Secondary | ICD-10-CM

## 2024-01-23 DIAGNOSIS — I2510 Atherosclerotic heart disease of native coronary artery without angina pectoris: Secondary | ICD-10-CM

## 2024-01-23 NOTE — Progress Notes (Addendum)
 Cardiology Office Note:    Date:  01/23/2024  ID:  [name], DOB 09-15-1955, MRN [id number] PCP: Marcia Rosaline SQUIBB, NP  [facility] Providers Cardiologist:  Madonna Large, DO Cardiology APP:  Rana Lum CROME, NP       Patient Profile:       Chief Complaint: 6-week follow-up History of Present Illness:  [name] is a 68 y.o. female with visit-pertinent history of coronary artery disease s/p CABG x 4 on 1/25, HFrEF, hypertension, hyperlipidemia, type 2 diabetes, TIA, cerebral aneurysm, anemia, GERD   She remotely saw Dr. Okey in 2016.  She was then previously seen by PCV for diagnosis of resistant hypertension.  Renal artery duplex 10/2021 showed no renal artery stenosis and simple right kidney cyst.  More recently she was admitted with chest pain, mildly elevated troponin, and hypertensive urgency in the setting of being without blood pressure meds for 3 months.  She was found to have an EF of 40 to 45% and multivessel coronary artery disease underwent CABG x 4 on 07/03/2023.  IntraOp TEE showed LVEF 45-50%.  Postop course was notable for anemia and left pleural effusion requiring thoracentesis attempted not enough fluid to tap.  She was recommended for DAPT given elevated enzymes.   She was in the ED on 2/24, 4/12, and 4/15 that was unremarkable.   She seen in office on 11/01/2023 by [place], [name].  She noted to be experiencing fatigue particularly in her mornings following her CABG. She was without any anginal symptoms.  Her blood pressure and heart rate was noted to be elevated.  She was started on amlodipine  5 mg daily.  Her metoprolol  tartrate 100 mg twice daily was also increased to 100 mg in the a.m. and 150 mg in the p.m.   She called into the nurse triage line on 12/04/2023 noting hypotension.  She was told to hold her medications at that time.  She was last seen in office on 12/05/2023.  She notes she has been experiencing low blood pressure readings over the past  2 days.  Most recent reading of 80/48 at local [facility].  She had stopped her amlodipine , losartan -HCTZ, and metoprolol .  She reported that today she does feel better and her lightheadedness/dizziness have improved slightly.  BP in office was 106/68.  Her amlodipine  5 mg daily was discontinued and she was to restart her losartan -hydrochlorothiazide  100-25 mg daily and metoprolol  tartrate 100 mg in the a.m. 150 mg in the p.m.  She was seen in the ED on 12/13/2023 for chest pains.  Troponins were unremarkable.  She was given IV fluids with improvement in symptoms.  She was discharged home in stable condition.  She was seen once more in the ED on 12/25/2023 for shortness of breath.  Troponins were negative with no acute electrolyte abnormalities.  She was discharged home in stable condition   Discussed the use of AI scribe software for clinical note transcription with the patient, who gave verbal consent to proceed.  Today patient is doing well overall she is without acute cardiovascular concerns or complaints today.  She has been without any chest pains or any exertional symptoms.  She has been going to her cardiac rehab and is able to exercise without limitation.  She reports her lightheadedness and dizziness have improved ever since she stopped amlodipine .  She does get her blood pressure taken at cardiac rehab and has had no further hypotensive episodes.  She denies any orthopnea, PND, leg swelling, syncope,  presyncope.  Review of systems:  Please see the history of present illness. All other systems are reviewed and otherwise negative.      Studies Reviewed:    EKG Interpretation Date/Time:  Thursday January 23 2024 15:41:53 EDT Ventricular Rate:  98 PR Interval:  146 QRS Duration:  80 QT Interval:  348 QTC Calculation: 444 R Axis:   -9  Text Interpretation: Normal sinus rhythm with sinus arrhythmia Inferior infarct , age undetermined Anterior infarct , age undetermined T wave abnormality, consider  lateral ischemia When compared with ECG of 25-Dec-2023 09:50, PREVIOUS ECG IS PRESENT Confirmed by Rana Dixon [phone]) on 01/23/2024 5:18:38 PM    Echocardiogram 06/24/2023 1. Left ventricular ejection fraction, by estimation, is 40 to 45%. The  left ventricle has mildly decreased function. The left ventricle  demonstrates regional wall motion abnormalities (see scoring  diagram/findings for description). There is mild  concentric left ventricular hypertrophy. Left ventricular diastolic  parameters are consistent with Grade I diastolic dysfunction (impaired  relaxation). Elevated left ventricular end-diastolic pressure.   2. Right ventricular systolic function is normal. The right ventricular  size is normal.   3. Left atrial size was severely dilated.   4. The mitral valve is normal in structure. Trivial mitral valve  regurgitation. No evidence of mitral stenosis.   5. The aortic valve is normal in structure. Aortic valve regurgitation is  not visualized. No aortic stenosis is present.   6. The inferior vena cava is normal in size with greater than 50%  respiratory variability, suggesting right atrial pressure of 3 mmHg.    Cardiac catheterization 06/27/2023 RECOMMENDATION: Transfer patient to [facility] under the Cardiology service  Patient will require CVTS consultation for CABG => Anticipated discharge date to be determined. Will require additional GDMT management of Hypertension, CAD and CHf   Will restart treatment /ACS dose of enoxaparin   With ACS presentation would not be unreasonable to load clopidogrel  300 mg and start 75 mg daily post CABG Diagnostic Dominance: Right   Risk Assessment/Calculations:              Physical Exam:   VS:  BP 100/80 (BP Location: Left Arm, Patient Position: Sitting, Cuff Size: Normal)   Pulse 98   Ht 5' 1 (1.549 m)   Wt 112 lb (50.8 kg)   BMI 21.16 kg/m    Wt Readings from Last 3 Encounters:  01/23/24 112 lb (50.8 kg)   12/13/23 110 lb (49.9 kg)  12/10/23 106 lb (48.1 kg)    GEN: Well nourished, well developed in no acute distress NECK: No JVD; No carotid bruits CARDIAC: RRR, no murmurs, rubs, gallops RESPIRATORY:  Clear to auscultation without rales, wheezing or rhonchi  ABDOMEN: Soft, non-tender, non-distended EXTREMITIES:  No edema; No acute deformity      Assessment and Plan:  Hypertension Blood pressure today is well-controlled at 100/80 Has BP taken regularly at cardiac rehab without further hypotension.  No syncope, lightheadedness, dizziness  - Continue losartan -hydrochlorothiazide  100-25 mg daily and metoprolol  tartrate 100 mg in the a.m. 150 mg in the p.m.   Coronary artery disease S/p CABG x 4 on 06/2023 - Today patient is without any anginal symptoms, no indication for further ischemic evaluation at this time - Doing well at cardiac rehab without exertional symptoms or limitations - Continue DAPT with aspirin  80 mg daily and Plavix  75 mg daily (seems DAPT was recommended at that time of CABG due to elevated troponins) - Continue Repatha , ezetimibe , metoprolol , rosuvastatin   Chronic HFmrEF Echocardiogram 05/2023 with LVEF 40 to 45%, anterior/lateral wall hypokinesis, mild LVH, grade 1 DD - Today patient appears euvolemic on exam.  No fluid volume overload and not having to use as needed loop diuretic.  No CP, dyspnea, orthopnea, PND, leg swelling.  Remains active at cardiac rehab without limitation - Will plan for repeat echocardiogram in 3 months to reevaluate LV function - Continue Farxiga  10 mg daily, losartan -hydrochlorothiazide  100-25 mg daily and metoprolol  100 mg in the a.m. and 150 mg in the p.m. (Lopressor  chosen due to her history of tachycardia)   Sinus tachycardia Heart rate today is 98 bpm and controlled - Continue metoprolol  as prescribed   Hyperlipidemia, LDL goal <55 LDL 127 on 06/2023 and not well-controlled Started Repatha  09/2023 - Repeat fasting lipid panel/LFTs  today - Continue Repatha , ezetimibe , and rosuvastatin       Dispo:  Return in about 4 months (around 05/24/2024).  Former Dr. Alveta patient.  Patient was seen by Dr. Michele while in hospital.  Will have patient establish with him.  Signed, Lum LITTIE Louis, NP

## 2024-01-23 NOTE — Patient Instructions (Signed)
 Medication Instructions:  NO CHANGES  Lab Work: FASTING LIPID PANEL AND CMET TO BE DONE.   Testing/Procedures: TO BE DONE IN 3 MONTHS.  Your physician has requested that you have an echocardiogram. Echocardiography is a painless test that uses sound waves to create images of your heart. It provides your doctor with information about the size and shape of your heart and how well your heart's chambers and valves are working. This procedure takes approximately one hour. There are no restrictions for this procedure. Please do NOT wear cologne, perfume, aftershave, or lotions (deodorant is allowed). Please arrive 15 minutes prior to your appointment time.  Please note: We ask at that you not bring children with you during ultrasound (echo/ vascular) testing. Due to room size and safety concerns, children are not allowed in the ultrasound rooms during exams. Our front office staff cannot provide observation of children in our lobby area while testing is being conducted. An adult accompanying a patient to their appointment will only be allowed in the ultrasound room at the discretion of the ultrasound technician under special circumstances. We apologize for any inconvenience.   Follow-Up: At [facility], you and your health needs are our priority.  As part of our continuing mission to provide you with exceptional heart care, our providers are all part of one team.  This team includes your primary Cardiologist (physician) and Advanced Practice Providers or APPs (Physician Assistants and Nurse Practitioners) who all work together to provide you with the care you need, when you need it.  Your next appointment:   4 MONTHS  Provider:   DR. MICHELE AS NEW PATIENT (FORMER NAHSER)

## 2024-01-24 ENCOUNTER — Encounter (HOSPITAL_COMMUNITY)
Admission: RE | Admit: 2024-01-24 | Discharge: 2024-01-24 | Disposition: A | Discharge status: Home / Self Care | Source: Ambulatory Visit | Attending: Cardiovascular Disease | Admitting: Cardiovascular Disease

## 2024-01-24 DIAGNOSIS — Z48812 Encounter for surgical aftercare following surgery on the circulatory system: Secondary | ICD-10-CM | POA: Insufficient documentation

## 2024-01-24 DIAGNOSIS — Z79899 Other long term (current) drug therapy: Secondary | ICD-10-CM | POA: Diagnosis not present

## 2024-01-24 DIAGNOSIS — Z951 Presence of aortocoronary bypass graft: Secondary | ICD-10-CM | POA: Insufficient documentation

## 2024-01-24 DIAGNOSIS — I10 Essential (primary) hypertension: Secondary | ICD-10-CM | POA: Diagnosis not present

## 2024-01-25 ENCOUNTER — Ambulatory Visit: Payer: Self-pay | Admitting: Emergency Medicine

## 2024-01-25 DIAGNOSIS — E782 Mixed hyperlipidemia: Secondary | ICD-10-CM

## 2024-01-25 LAB — LIPID PANEL
Chol/HDL Ratio: 3.3 ratio (ref 0.0–4.4)
Cholesterol, Total: 252 mg/dL — ABNORMAL HIGH (ref 100–199)
HDL: 76 mg/dL (ref >39)
LDL Chol Calc (NIH): 146 mg/dL — ABNORMAL HIGH (ref 0–99)
Triglycerides: 168 mg/dL — ABNORMAL HIGH (ref 0–149)
VLDL Cholesterol Cal: 30 mg/dL (ref 5–40)

## 2024-01-25 LAB — COMPREHENSIVE METABOLIC PANEL WITH GFR
ALT: 20 IU/L (ref 0–32)
AST: 23 IU/L (ref 0–40)
Albumin: 4.6 g/dL (ref 3.9–4.9)
Alkaline Phosphatase: 117 IU/L (ref 44–121)
BUN/Creatinine Ratio: 27 (ref 12–28)
BUN: 23 mg/dL (ref 8–27)
Bilirubin Total: 0.5 mg/dL (ref 0.0–1.2)
CO2: 20 mmol/L (ref 20–29)
Calcium: 10.3 mg/dL (ref 8.7–10.3)
Chloride: 100 mmol/L (ref 96–106)
Creatinine, Ser: 0.84 mg/dL (ref 0.57–1.00)
Globulin, Total: 3.1 g/dL (ref 1.5–4.5)
Glucose: 124 mg/dL — ABNORMAL HIGH (ref 70–99)
Potassium: 4.5 mmol/L (ref 3.5–5.2)
Sodium: 139 mmol/L (ref 134–144)
Total Protein: 7.7 g/dL (ref 6.0–8.5)
eGFR: 75 mL/min/1.73 (ref >59)

## 2024-01-27 ENCOUNTER — Other Ambulatory Visit: Payer: Self-pay | Admitting: Thoracic Surgery (Cardiothoracic Vascular Surgery)

## 2024-01-27 ENCOUNTER — Encounter (HOSPITAL_COMMUNITY)
Admission: RE | Admit: 2024-01-27 | Discharge: 2024-01-27 | Disposition: A | Discharge status: Home / Self Care | Source: Ambulatory Visit | Attending: Cardiovascular Disease | Admitting: Cardiovascular Disease

## 2024-01-27 DIAGNOSIS — Z951 Presence of aortocoronary bypass graft: Secondary | ICD-10-CM | POA: Diagnosis not present

## 2024-01-27 DIAGNOSIS — H93293 Other abnormal auditory perceptions, bilateral: Secondary | ICD-10-CM | POA: Diagnosis not present

## 2024-01-27 DIAGNOSIS — Z48812 Encounter for surgical aftercare following surgery on the circulatory system: Secondary | ICD-10-CM | POA: Diagnosis not present

## 2024-01-27 NOTE — Progress Notes (Signed)
 Otolaryngology Clinic Note  HPI:    Cc: hearing check.   [name] is a pleasant 68 y.o. female who presents as a new patient for hearing evaluation. She was last in 2020 for cerumen management. She feels her hearing is generally good but would like to obtain a baseline today. Denies otalgia, otorrhea, ear pruritus, tinnitus or dizziness.   No history of recurrent ear infections, ear surgeries or significant noise exposures.  Former smoker.   PMH/Meds/All/SocHx/FamHx/ROS:   Medical History[1]  Surgical History[2]  No family history of bleeding disorders, wound healing problems or difficulty with anesthesia.      Current Medications[3]  A complete ROS was performed with pertinent positives/negatives noted in the HPI. The remainder of the ROS are negative.    Physical Exam:    BP 125/85 (BP Location: Left arm, Patient Position: Sitting)   Pulse 90   Temp 97.5 F (36.4 C) (Temporal)   Resp 16   Ht 1.549 m (5' 1)   Wt 52.3 kg (115 lb 6.4 oz)   BMI 21.80 kg/m    General Awake, at baseline alertness during examination.  Eyes No scleral icterus or conjunctival hemorrhage. Globe position appears normal. EOMI.   Right Ear EAC patent, TM intact w/o inflammation. Middle ear well aerated.   Left Ear EAC patent, TM intact w/o inflammation. Middle ear well aerated.   Nose Patent, no polyps or masses seen on anterior rhinoscopy.  Oral cavity Missing teeth. No mucosal lesions or tumors seen. Tongue midline.   Oropharynx Symmetric tonsils.   Neck No abnormal cervical lymphadenopathy. No thyromegaly. No thyroid  masses palpated.  Cardio-vascular No cyanosis.  Pulmonary No audible stridor. Breathing easily with no labor.  Neuro Symmetric facial movement.   Psychiatry Appropriate affect and mood for clinic visit.   Independent Review of Additional Tests or Records:   Medical records.   Procedures:   Audiometry: pure tone audiogram demonstrates normal, symmetric hearing  across all frequencies.   Word understanding: 100% right, 92% left.   Tympanometry: Type A bilaterally.  Impression & Plans:  [name] is a 68 y.o. female with normal head and neck exam. Ears are normal to inspection. Results of the audiogram were discussed with patient. Hearing is normal and symmetric across all frequencies tested with excellent word understanding and normal tympanometry. Reassured patient. Monitor for any new or progressive changes. Follow up as needed with ENT.   Patient agrees with the plan.  Velia Pry, PA-C [facility]       [1] Past Medical History: Diagnosis Date  . Hypertension   [2] Past Surgical History: Procedure Laterality Date  . CESAREAN SECTION, UNSPECIFIED     Procedure: CESAREAN SECTION  . CHOLECYSTECTOMY     Procedure: CHOLECYSTECTOMY  [3]  Current Outpatient Medications:  .  Accu-Chek Guide Me Glucose Mtr misc, 1 each daily. TO CHECK BLOOD SUGAR, Disp: , Rfl:  .  Accu-Chek Softclix Lancets misc, 1 strip daily. TO CHECK BLOOD SUGAR, Disp: , Rfl:  .  clopidogreL  (PLAVIX ) 75 mg tablet, , Disp: , Rfl:  .  cyclobenzaprine  (FLEXERIL ) 5 mg tablet, Take 5 mg by mouth., Disp: , Rfl:  .  DULoxetine  (CYMBALTA ) 30 mg capsule, TAKE ONE CAPSULE BY MOUTH TWICE DAILY @ 9AM & 5PM, Disp: , Rfl:  .  furosemide  (LASIX ) 20 mg tablet, , Disp: , Rfl:  .  Januvia  25 mg tablet, TAKE ONE TABLET (25 MG TOTAL) BY MOUTH DAILY AT 9AM, Disp: , Rfl:  .  loperamide  (IMODIUM   A-D) 2 mg tablet, Take 2 mg by mouth., Disp: , Rfl:  .  losartan -hydroCHLOROthiazide  (HYZAAR ) 100-25 mg per tablet, Take 1 tablet by mouth daily., Disp: , Rfl:  .  metoprolol  tartrate (LOPRESSOR ) 100 mg tablet, TAKE 1 TABLET IN THE AM AND 1.5 TABLETS IN THE PM, Disp: , Rfl:  .  ondansetron  (ZOFRAN -ODT) 4 mg disintegrating tablet, 4mg  ODT q4 hours prn nausea/vomit, Disp: , Rfl:  .  potassium chloride  (KLOR-CON ) 20 mEq ER tablet, Take  by mouth., Disp: , Rfl:  .  promethazine  (PHENERGAN ) 25  mg tablet, Take 25 mg by mouth., Disp: , Rfl:  .  rosuvastatin  (CRESTOR ) 40 mg tablet, TAKE ONE TABLET BY MOUTH DAILY AT 5PM, Disp: , Rfl:

## 2024-01-27 NOTE — Progress Notes (Signed)
 [facility] [place] [facility] Note  Patient Name: [name]   Patient DOB: Dec 27, 1955                Patient Age: 68 y.o.    Reason for Visit: [name] is here unaccompanied in conjunction with seeing Velia Pry, PA-C for concerns of possible hearing loss because sometimes asks people to repeat. Her mother had difficulty hearing.    Given these concerns, a comprehensive hearing test was completed to assess [name]'s hearing ability and speech recognition abilities.    Results: Refer to results scanned into Media tab.  Otoscopy: Otoscopic examination showed canals free of obstructing cerumen for both ears. Tympanic membranes appear healthy and a cone of light reflex is present for both ears.   Tympanometry: Tympanometry was completed due to reported history. Results were: Right Ear: Type A - Normal compliance, middle ear pressure, and ear canal volume. Left Ear: Type A - Normal compliance, middle ear pressure, and ear canal volume.  Audiometric Results: Results were obtained using headphones then inserts. Test reliability was good.  Test was completed in [facility] 4 and written by hand before being saved into [facility] Module. The Sound Karon was last calibrated on March 18, 2023.  Puretone Thresholds: Normal hearing, bilaterally.  Results are symmetrical.  Speech Reception Thresholds: Completed using monitored live voice. Right Ear: 5 dBHL; which is in agreement with PTA.  Left Ear: 5 dBHL; which is in agreement with PTA.   Word Recognition Testing: Completed using a recording of CID-W22. Right Ear: 100 % at 55 dBHL with 35 dBem, which is a normal listening level. Left Ear: 92 % at 55 dBHL with 35 dBem, which is a normal listening level.  Impression: Normal hearing and normal eardrum mobility, bilaterally.   Recommendations: Recommend repeat hearing test in conjunction with otologic care, sooner with concerns  of sudden hearing loss, deteriorating hearing, or new or worsening tinnitus. Recommend proper use of adequate hearing protection in hazardous noise.

## 2024-01-28 ENCOUNTER — Ambulatory Visit
Payer: PPO | Attending: Thoracic Surgery (Cardiothoracic Vascular Surgery) | Admitting: Thoracic Surgery (Cardiothoracic Vascular Surgery)

## 2024-01-28 ENCOUNTER — Ambulatory Visit (HOSPITAL_COMMUNITY)
Admission: RE | Admit: 2024-01-28 | Discharge: 2024-01-28 | Disposition: A | Discharge status: Home / Self Care | Source: Ambulatory Visit | Attending: Thoracic Surgery (Cardiothoracic Vascular Surgery) | Admitting: Thoracic Surgery (Cardiothoracic Vascular Surgery)

## 2024-01-28 VITALS — BP 156/105 | HR 95 | Resp 18 | Ht 61.0 in | Wt 116.0 lb

## 2024-01-28 DIAGNOSIS — Z48812 Encounter for surgical aftercare following surgery on the circulatory system: Secondary | ICD-10-CM | POA: Insufficient documentation

## 2024-01-28 DIAGNOSIS — J90 Pleural effusion, not elsewhere classified: Secondary | ICD-10-CM | POA: Diagnosis not present

## 2024-01-28 DIAGNOSIS — J9811 Atelectasis: Secondary | ICD-10-CM | POA: Diagnosis not present

## 2024-01-28 DIAGNOSIS — Z951 Presence of aortocoronary bypass graft: Secondary | ICD-10-CM | POA: Diagnosis not present

## 2024-01-28 DIAGNOSIS — R918 Other nonspecific abnormal finding of lung field: Secondary | ICD-10-CM | POA: Diagnosis not present

## 2024-01-28 NOTE — Progress Notes (Signed)
 [address]       [name] [address]             [phone]     HPI: [name] returns for scheduled follow-up visit  [name] is a 68 year old woman with a history of hypertension, hyperlipidemia, type 2 diabetes, brain aneurysm, reflux, anxiety, coronary artery disease, who is status post coronary bypass grafting x 4 in January 2025.  She had CABG x 4 in January.  I saw her back in the office in February.  Chest x-ray showed an elevated left hemidiaphragm.   Feeling well overall.  She does note that sometimes she gets some shortness of breath with exertion.  Especially when she is carrying something heavy.  She has been doing cardiac rehab, and that has been going well.  No anginal type chest pain.  Has an occasional sharp chest pain along the left edge of her sternum.  Past Medical History:  Diagnosis Date   Anemia    hx   Aneurysm (HCC)    small, on left side of brain    Anxiety    hx of   Arthritis    generalized   CAD (coronary artery disease)    Diabetes mellitus without complication (HCC)    diet control, pt denies- on medications   GERD (gastroesophageal reflux disease)    iwht certain foods/on meds/OTC PRN meds   Heart failure with mildly reduced ejection fraction (HFmrEF) (HCC)    Hyperlipidemia    on meds   Hypertension    on meds   TIA (transient ischemic attack)    pt unaware of this hx on 06/19/2017     Current Outpatient Medications  Medication Sig Dispense Refill   Accu-Chek Softclix Lancets lancets Use to check blood sugar once daily. 100 each 2   aspirin  EC 81 MG tablet Take 1 tablet (81 mg total) by mouth daily.     Blood Glucose Monitoring Suppl (ACCU-CHEK GUIDE) w/Device KIT Use to check blood sugar once daily. 1 kit 0   clopidogrel  (PLAVIX ) 75 MG tablet Take 1 tablet (75 mg total) by mouth daily. 90 tablet 3   dapagliflozin  propanediol (FARXIGA ) 10 MG TABS tablet Take 1 tablet (10 mg total) by mouth daily before  breakfast. 90 tablet 1   dicyclomine  (BENTYL ) 10 MG capsule Take 1 capsule (10 mg total) by mouth 3 (three) times daily before meals. 90 capsule 11   doxycycline  (VIBRAMYCIN ) 100 MG capsule Take 1 capsule (100 mg total) by mouth 2 (two) times daily. 20 capsule 0   DULoxetine  (CYMBALTA ) 30 MG capsule Take 1 capsule (30 mg total) by mouth daily. 180 capsule 1   Evolocumab  (REPATHA  SURECLICK) 140 MG/ML SOAJ Inject 140 mg into the skin every 14 (fourteen) days. 6 mL 3   ezetimibe  (ZETIA ) 10 MG tablet Take 1 tablet (10 mg total) by mouth daily. 90 tablet 3   fluconazole  (DIFLUCAN ) 150 MG tablet Take 1 tablet (150 mg total) by mouth daily. 1 tablet 1   furosemide  (LASIX ) 20 MG tablet Take 1 tablet (20 mg total) by mouth daily as needed. 90 tablet 3   glucose blood (ACCU-CHEK GUIDE TEST) test strip Use to check blood sugar once daily. 100 each 2   hydrOXYzine  (ATARAX ) 25 MG tablet Take 1 tablet (25 mg total) by mouth every 6 (six) hours. 12 tablet 0   JANUVIA  25 MG tablet Take 25 mg by mouth daily.     losartan -hydrochlorothiazide  (  HYZAAR ) 100-25 MG tablet Take 1 tablet by mouth daily. 90 tablet 3   methocarbamol  (ROBAXIN ) 500 MG tablet Take 1 tablet (500 mg total) by mouth every 8 (eight) hours as needed for muscle spasms. 20 tablet 0   methylPREDNISolone  (MEDROL ) 4 MG tablet To finish hospital course: 07/10/23 Take 1 tablet at supper, and 2 tabs at bedtime. on 07/11/23, take 1 tablet at breakfast, lunch, supper and bedtime (4 doses). On 1/17 take 1 tab at breakfast, lunch and bedtime. (3 doses) On 1/18, take 1 tab at breakfast and bedtime (2 doses), then on 1/19 take 1 tab at breakfast. 13 tablet 0   metoprolol  tartrate (LOPRESSOR ) 100 MG tablet TAKE 1 TABLET IN THE AM AND 1.5 TABLETS IN THE PM 225 tablet 3   pantoprazole  (PROTONIX ) 40 MG tablet TAKE ONE TABLET (40mg  total) BY MOUTH DAILY AT 9AM 30 tablet 0   potassium chloride  SA (KLOR-CON  M) 20 MEQ tablet Take 1 tablet (20 mEq total) by mouth daily as  needed (when taking furosemide  (lasix )). 90 tablet 3   pregabalin (LYRICA) 50 MG capsule Take 50 mg by mouth 3 (three) times daily.     rosuvastatin  (CRESTOR ) 40 MG tablet Take 1 tablet (40 mg total) by mouth daily. 90 tablet 3   No current facility-administered medications for this visit.    Physical Exam BP (!) 156/105 (BP Location: Left Arm)   Pulse 95   Resp 18   Ht 5' 1 (1.549 m)   Wt 116 lb (52.6 kg)   SpO2 95%   BMI 21.66 kg/m  68 year old woman in no acute distress Alert and oriented x 3 with no focal deficits Lungs very slightly diminished at left base Cardiac regular rate and rhythm Sternum stable No peripheral edema  Diagnostic Tests: I personally reviewed the chest x-ray images.  There is still some elevation of the left hemidiaphragm although improved compared to her films from April  Impression: [name] is a 68 year old woman with a history of hypertension, hyperlipidemia, type 2 diabetes, brain aneurysm, reflux, anxiety, coronary artery disease, who is status post coronary bypass grafting x 4 in January 2025.  Status post CABG-no recurrent angina.  Hypertension-blood pressure elevated 156/105.  She says that she has been compliant with medications.  She does check herself at home and has been running in the 100-120 range systolic recently.  Advised her to continue to monitor and if she sees values above 140 she may need medication adjustments.  Elevated left hemidiaphragm-improved from April.  Hopefully will continue to improve and return to normal over time.  Some symptoms but doubt she would ever require intervention.  Plan: Return in 6 months with PA lateral chest x-ray  Elspeth JAYSON Millers, MD [facility] [phone]

## 2024-01-29 ENCOUNTER — Encounter (HOSPITAL_COMMUNITY)
Admission: RE | Admit: 2024-01-29 | Discharge: 2024-01-29 | Disposition: A | Discharge status: Home / Self Care | Source: Ambulatory Visit | Attending: Cardiovascular Disease | Admitting: Cardiovascular Disease

## 2024-01-29 DIAGNOSIS — Z48812 Encounter for surgical aftercare following surgery on the circulatory system: Secondary | ICD-10-CM | POA: Diagnosis not present

## 2024-01-29 DIAGNOSIS — Z951 Presence of aortocoronary bypass graft: Secondary | ICD-10-CM

## 2024-01-31 ENCOUNTER — Encounter (HOSPITAL_COMMUNITY)
Admission: RE | Admit: 2024-01-31 | Discharge: 2024-01-31 | Disposition: A | Discharge status: Home / Self Care | Source: Ambulatory Visit | Attending: Cardiovascular Disease | Admitting: Cardiovascular Disease

## 2024-01-31 ENCOUNTER — Ambulatory Visit
Admission: RE | Admit: 2024-01-31 | Discharge: 2024-01-31 | Disposition: A | Discharge status: Home / Self Care | Source: Ambulatory Visit | Attending: Primary Care | Admitting: Primary Care

## 2024-01-31 DIAGNOSIS — Z1231 Encounter for screening mammogram for malignant neoplasm of breast: Secondary | ICD-10-CM

## 2024-01-31 DIAGNOSIS — Z0000 Encounter for general adult medical examination without abnormal findings: Secondary | ICD-10-CM

## 2024-02-03 ENCOUNTER — Other Ambulatory Visit: Payer: Self-pay | Admitting: Family Medicine

## 2024-02-03 ENCOUNTER — Telehealth (HOSPITAL_COMMUNITY): Payer: Self-pay

## 2024-02-03 ENCOUNTER — Encounter (HOSPITAL_COMMUNITY): Admission: RE | Admit: 2024-02-03 | Source: Ambulatory Visit

## 2024-02-03 NOTE — Telephone Encounter (Signed)
 Called patient regarding no call, no show for 10:15 CR class. Patient states she had some personal stuff going on today but will be in on Wednesday.

## 2024-02-04 ENCOUNTER — Telehealth: Payer: Self-pay | Admitting: Emergency Medicine

## 2024-02-04 NOTE — Telephone Encounter (Signed)
 Pt c/o of Chest Pain: STAT if active (IN THIS MOMENT) CP, including tightness, pressure, jaw pain, shoulder/upper arm/back pain, SOB, nausea, and vomiting.  1. Are you having CP right now (tightness, pressure, or discomfort)? No   2. Are you experiencing any other symptoms (ex. SOB, nausea, vomiting, sweating)? A little nausea when cp occurs   3. How long have you been experiencing CP?  Started this morning   4. Is your CP continuous or coming and going? Coming and going   5. Have you taken Nitroglycerin ? No but she took an aspirin   6. If CP returns before callback, please consider calling 911. ?

## 2024-02-04 NOTE — Progress Notes (Signed)
 Cardiac Individual Treatment Plan  Patient Details  Name: [name] MRN: [id number] Date of Birth: October 17, 1955 Referring Provider:   Flowsheet Row INTENSIVE CARDIAC REHAB ORIENT from 11/28/2023 in [facility]  Referring Provider Dr. 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  Patient's Home Medications on Admission:  Current Outpatient Medications:    Accu-Chek Softclix Lancets lancets, Use to check blood sugar once daily., Disp: 100 each, Rfl: 2   aspirin  EC 81 MG tablet, Take 1 tablet (81 mg total) by mouth daily., Disp: , Rfl:    Blood Glucose Monitoring Suppl (ACCU-CHEK GUIDE) w/Device KIT, Use to check blood sugar once daily., Disp: 1 kit, Rfl: 0   clopidogrel  (PLAVIX ) 75 MG tablet, Take 1 tablet (75 mg total) by mouth daily., Disp: 90 tablet, Rfl: 3   dapagliflozin  propanediol (FARXIGA ) 10 MG TABS tablet, Take 1 tablet (10 mg total) by mouth daily before breakfast., Disp: 90 tablet, Rfl: 1   dicyclomine  (BENTYL ) 10 MG capsule, Take 1 capsule (10 mg total) by mouth 3 (three) times daily before meals., Disp: 90 capsule, Rfl: 11   doxycycline  (VIBRAMYCIN ) 100 MG capsule, Take 1 capsule (100 mg total) by mouth 2 (two) times daily., Disp: 20 capsule, Rfl: 0   DULoxetine  (CYMBALTA ) 30 MG capsule, Take 1 capsule (30 mg total) by mouth daily., Disp: 180 capsule, Rfl: 1   Evolocumab  (REPATHA  SURECLICK) 140 MG/ML SOAJ, Inject 140 mg into the skin every 14 (fourteen) days., Disp: 6 mL, Rfl: 3   ezetimibe  (ZETIA ) 10 MG tablet, Take 1 tablet (10 mg total) by mouth daily., Disp: 90 tablet, Rfl: 3   fluconazole  (DIFLUCAN ) 150 MG tablet, Take 1 tablet (150 mg total) by mouth daily., Disp: 1 tablet, Rfl: 1   furosemide  (LASIX ) 20 MG tablet, Take 1  tablet (20 mg total) by mouth daily as needed., Disp: 90 tablet, Rfl: 3   glucose blood (ACCU-CHEK GUIDE TEST) test strip, Use to check blood sugar once daily., Disp: 100 each, Rfl: 2   hydrOXYzine  (ATARAX ) 25 MG tablet, Take 1 tablet (25 mg total) by mouth every 6 (six) hours., Disp: 12 tablet, Rfl: 0   JANUVIA  25 MG tablet, Take 25 mg by mouth daily., Disp: , Rfl:    losartan -hydrochlorothiazide  (HYZAAR ) 100-25 MG tablet, Take 1 tablet by mouth daily., Disp: 90 tablet, Rfl: 3   methocarbamol  (ROBAXIN ) 500 MG tablet, Take 1 tablet (500 mg total) by mouth every 8 (eight) hours as needed for muscle spasms., Disp: 20 tablet, Rfl: 0   methylPREDNISolone  (MEDROL ) 4 MG tablet, To finish hospital course: 07/10/23 Take 1 tablet at supper, and 2 tabs at bedtime. on 07/11/23, take 1 tablet at breakfast, lunch, supper and bedtime (4 doses). On 1/17 take 1 tab at breakfast, lunch and bedtime. (3 doses) On 1/18, take 1 tab at breakfast and bedtime (2 doses), then on 1/19 take 1 tab at breakfast., Disp: 13 tablet, Rfl: 0   metoprolol  tartrate (LOPRESSOR ) 100 MG tablet, TAKE 1 TABLET IN THE AM AND 1.5 TABLETS IN THE PM, Disp: 225 tablet, Rfl: 3   pantoprazole  (PROTONIX ) 40 MG tablet, TAKE ONE TABLET (40mg  total) BY MOUTH DAILY AT 9AM, Disp: 30 tablet, Rfl: 0   potassium chloride  SA (KLOR-CON  M) 20  MEQ tablet, Take 1 tablet (20 mEq total) by mouth daily as needed (when taking furosemide  (lasix ))., Disp: 90 tablet, Rfl: 3   pregabalin (LYRICA) 50 MG capsule, Take 50 mg by mouth 3 (three) times daily., Disp: , Rfl:    rosuvastatin  (CRESTOR ) 40 MG tablet, Take 1 tablet (40 mg total) by mouth daily., Disp: 90 tablet, Rfl: 3  Past Medical History: Past Medical History:  Diagnosis Date   Anemia    hx   Aneurysm (HCC)    small, on left side of brain    Anxiety    hx of   Arthritis    generalized   CAD (coronary artery disease)    Diabetes mellitus without complication (HCC)    diet control, pt denies- on  medications   GERD (gastroesophageal reflux disease)    iwht certain foods/on meds/OTC PRN meds   Heart failure with mildly reduced ejection fraction (HFmrEF) (HCC)    Hyperlipidemia    on meds   Hypertension    on meds   TIA (transient ischemic attack)    pt unaware of this hx on 06/19/2017    Tobacco Use: Social History   Tobacco Use  Smoking Status Former   Types: Cigarettes  Smokeless Tobacco Never    Labs: Review Flowsheet  More data exists      Latest Ref Rng & Units 07/02/2023 07/03/2023 07/25/2023 11/08/2023 01/24/2024  Labs for ITP Cardiac and Pulmonary Rehab  Cholestrol 100 - 199 mg/dL - - 748  - 747   LDL (calc) 0 - 99 mg/dL - - 842  - 853   HDL-C >39 mg/dL - - 59  - 76   Trlycerides 0 - 149 mg/dL - - 806  - 831   Hemoglobin A1c 0.0 - 7.0 % - 6.2  - 7.3  -  PH, Arterial 7.35 - 7.45 7.47  7.321  7.318  7.429  7.453  7.414  7.390  7.458  7.420  - - -  PCO2 arterial 32 - 48 mmHg 32  46.7  46.4  38.1  36.7  40.8  33.2  30.2  27.1  - - -  Bicarbonate 20.0 - 28.0 mmol/L 23.3  24.1  23.8  25.6  25.7  26.1  20.1  22.9  21.4  17.6  - - -  TCO2 22 - 32 mmol/L - 26  25  27  27  28  27   37  21  24  22  24  20  18   - - -  Acid-base deficit 0.0 - 2.0 mmol/L - 2.0  2.0  5.0  2.0  2.0  6.0  - - -  O2 Saturation % 99.2  99  99  99  100  100  100  86  100  100  - - -    Details       Multiple values from one day are sorted in reverse-chronological order         Capillary Blood Glucose: Lab Results  Component Value Date   GLUCAP 181 (H) 12/18/2023   GLUCAP 147 (H) 12/04/2023   GLUCAP 193 (H) 12/04/2023   GLUCAP 143 (H) 12/02/2023   GLUCAP 199 (H) 12/02/2023     Exercise Target Goals: Exercise Program Goal: Individual exercise prescription set using results from initial 6 min walk test and THRR while considering  patient's activity barriers and safety.   Exercise Prescription Goal: Initial exercise prescription builds to 30-45 minutes a day of aerobic activity, 2-3  days  per week.  Home exercise guidelines will be given to patient during program as part of exercise prescription that the participant will acknowledge.  Activity Barriers & Risk Stratification:  Activity Barriers & Cardiac Risk Stratification - 11/28/23 1209       Activity Barriers & Cardiac Risk Stratification   Activity Barriers Deconditioning;Shortness of Breath;Decreased Ventricular Function;Balance Concerns;Neck/Spine Problems    Cardiac Risk Stratification High          6 Minute Walk:  6 Minute Walk     Row Name 11/28/23 1202         6 Minute Walk   Phase Initial     Distance 890 feet     Walk Time 6 minutes     # of Rest Breaks 1     MPH 1.69     METS 2.31     RPE 9     Perceived Dyspnea  1     VO2 Peak 8.08     Symptoms Yes (comment)     Comments standing rest break about 1/2 way about 30 seconds due to SOB and mild dizziness. Both resolved with the break and did not return     Resting HR 72 bpm     Resting BP 110/66     Resting Oxygen Saturation  98 %     Exercise Oxygen Saturation  during 6 min walk 96 %     Max Ex. HR 90 bpm     Max Ex. BP 120/80     2 Minute Post BP 110/78        Oxygen Initial Assessment:   Oxygen Re-Evaluation:   Oxygen Discharge (Final Oxygen Re-Evaluation):   Initial Exercise Prescription:  Initial Exercise Prescription - 11/28/23 1200       Date of Initial Exercise RX and Referring Provider   Date 11/28/23    Referring Provider Dr. Aleene Passe MD    Expected Discharge Date 02/17/24      NuStep   Level 1    SPM 70    Minutes 30    METs 1.8      Prescription Details   Frequency (times per week) 3    Duration Progress to 30 minutes of continuous aerobic without signs/symptoms of physical distress      Intensity   THRR 40-80% of Max Heartrate 60-121    Ratings of Perceived Exertion 11-13    Perceived Dyspnea 0-4      Progression   Progression Continue progressive overload as per policy without signs/symptoms or  physical distress.      Resistance Training   Training Prescription Yes    Weight 2    Reps 10-15          Perform Capillary Blood Glucose checks as needed.  Exercise Prescription Changes:   Exercise Prescription Changes     Row Name 12/02/23 1037 12/20/23 1028 01/06/24 1034 01/27/24 1037       Response to Exercise   Blood Pressure (Admit) 96/60 100/66 102/62 132/82    Blood Pressure (Exercise) 102/60 130/74 -- --    Blood Pressure (Exit) 113/77 104/64 114/64 114/78    Heart Rate (Admit) 84 bpm 86 bpm 97 bpm 90 bpm    Heart Rate (Exercise) 106 bpm 106 bpm 134 bpm 132 bpm    Heart Rate (Exit) 79 bpm 82 bpm 105 bpm 99 bpm    Rating of Perceived Exertion (Exercise) 13 11 11 11     Symptoms None None None None  Comments Off to a good start with exercise. Reviewed home exercise guidelines and goals with Darlen. -- --    Duration Continue with 30 min of aerobic exercise without signs/symptoms of physical distress. Continue with 30 min of aerobic exercise without signs/symptoms of physical distress. Continue with 30 min of aerobic exercise without signs/symptoms of physical distress. Continue with 30 min of aerobic exercise without signs/symptoms of physical distress.    Intensity THRR unchanged THRR unchanged THRR unchanged THRR unchanged      Progression   Progression Continue to progress workloads to maintain intensity without signs/symptoms of physical distress. Continue to progress workloads to maintain intensity without signs/symptoms of physical distress. Continue to progress workloads to maintain intensity without signs/symptoms of physical distress. Continue to progress workloads to maintain intensity without signs/symptoms of physical distress.    Average METs 2.3 3.2 2.5 2.6      Resistance Training   Training Prescription Yes Yes Yes Yes    Weight 2 lbs 2 lbs 2 lbs 2 lbs    Reps 10-15 10-15 10-15 10-15    Time 10 Minutes 10 Minutes 10 Minutes 10 Minutes      Interval  Training   Interval Training No No No No      NuStep   Level 1 2 2 2     SPM 81 94 88 85    Minutes 30 30 30 30     METs 2.3 3.2 2.5 2.6      Home Exercise Plan   Plans to continue exercise at -- [employer] (comment)  Walking, gym at her residence [employer] (comment)  Walking, gym at her residence [employer] (comment)  Walking, gym at her residence    Frequency -- Add 2 additional days to program exercise sessions. Add 2 additional days to program exercise sessions. Add 2 additional days to program exercise sessions.    Initial Home Exercises Provided -- 12/20/23 12/20/23 12/20/23       Exercise Comments:   Exercise Comments     Row Name 12/02/23 1136 12/06/23 1105 12/20/23 1112 12/30/23 1117 01/17/24 1051   Exercise Comments Perpetua tolerated low intensity exercise well without symptoms. Oriented her to the exercise equipment and stretching routine. Reviewed METs with Zuleyma. Reviewed home exercise guidelines and goals with Blondina. Reviewed METs with Kaye. Reviewed goals with Careli.    Row Name 01/29/24 1100           Exercise Comments Reviewed METs and goals with Jovanni.          Exercise Goals and Review:   Exercise Goals     Row Name 11/28/23 1211             Exercise Goals   Increase Physical Activity Yes       Intervention Provide advice, education, support and counseling about physical activity/exercise needs.;Develop an individualized exercise prescription for aerobic and resistive training based on initial evaluation findings, risk stratification, comorbidities and participant's personal goals.       Expected Outcomes Short Term: Attend rehab on a regular basis to increase amount of physical activity.;Long Term: Exercising regularly at least 3-5 days a week.;Long Term: Add in home exercise to make exercise part of routine and to increase amount of physical activity.       Increase Strength and Stamina Yes       Intervention  Provide advice, education, support and counseling about physical activity/exercise needs.;Develop an individualized exercise prescription for aerobic and resistive training based on initial evaluation findings,  risk stratification, comorbidities and participant's personal goals.       Expected Outcomes Short Term: Increase workloads from initial exercise prescription for resistance, speed, and METs.;Long Term: Improve cardiorespiratory fitness, muscular endurance and strength as measured by increased METs and functional capacity ( );Short Term: Perform resistance training exercises routinely during rehab and add in resistance training at home       Able to understand and use rate of perceived exertion (RPE) scale Yes       Intervention Provide education and explanation on how to use RPE scale       Expected Outcomes Short Term: Able to use RPE daily in rehab to express subjective intensity level;Long Term:  Able to use RPE to guide intensity level when exercising independently       Knowledge and understanding of Target Heart Rate Range (THRR) Yes       Intervention Provide education and explanation of THRR including how the numbers were predicted and where they are located for reference       Expected Outcomes Short Term: Able to state/look up THRR;Long Term: Able to use THRR to govern intensity when exercising independently;Short Term: Able to use daily as guideline for intensity in rehab       Understanding of Exercise Prescription Yes       Intervention Provide education, explanation, and written materials on patient's individual exercise prescription       Expected Outcomes Short Term: Able to explain program exercise prescription;Long Term: Able to explain home exercise prescription to exercise independently          Exercise Goals Re-Evaluation :  Exercise Goals Re-Evaluation     Row Name 12/02/23 1136 12/20/23 1112 01/17/24 1051 01/29/24 1100       Exercise Goal Re-Evaluation    Exercise Goals Review Increase Physical Activity;Increase Strength and Stamina;Able to understand and use rate of perceived exertion (RPE) scale Increase Physical Activity;Increase Strength and Stamina;Able to understand and use rate of perceived exertion (RPE) scale;Understanding of Exercise Prescription;Knowledge and understanding of Target Heart Rate Range (THRR) Increase Physical Activity;Increase Strength and Stamina;Able to understand and use rate of perceived exertion (RPE) scale;Understanding of Exercise Prescription;Knowledge and understanding of Target Heart Rate Range (THRR) Increase Physical Activity;Increase Strength and Stamina;Able to understand and use rate of perceived exertion (RPE) scale;Understanding of Exercise Prescription;Knowledge and understanding of Target Heart Rate Range (THRR)    Comments Arnika was able to understand and use RPE scale appropriately. Reviewed exercise prescription with Nicle. She's walking and using the small gym at her residence. She is looking into joining the Y. Lynnda is making gradual progress with exercise. She is walking 10 minutes in the morning and 20 minutes in the afternoon. She is also doing the warm-up cool-down stretches from [facility]. She feels she's made a lot of progress in the program. Her energy level has gotten a lot better. She has neuropathy and is still a little off balance because of this. I[facility] has helped her a lot. Quandra continues to make gradual progress with exercise. Discussed activity progression and how to increase MET level. She will increase her steps/min on the NuStep. She is interested in trying a new piece of equipment. Will try the recumbent bike next session.    Expected Outcomes Progress workloads as tolerated to help improve cardiorespiratory fitness. Caoimhe will continue exercise 30 minutes at least 2 days/week in addition to exercise at cardiac rehab to achieve 150 minutes of aerobic exercise per week. Continue to progress  workloads as  tolerated to help with energy and improve balance. Will add recumbent bike to Tausha's exercise prescription.       Discharge Exercise Prescription (Final Exercise Prescription Changes):  Exercise Prescription Changes - 01/27/24 1037       Response to Exercise   Blood Pressure (Admit) 132/82    Blood Pressure (Exit) 114/78    Heart Rate (Admit) 90 bpm    Heart Rate (Exercise) 132 bpm    Heart Rate (Exit) 99 bpm    Rating of Perceived Exertion (Exercise) 11    Symptoms None    Duration Continue with 30 min of aerobic exercise without signs/symptoms of physical distress.    Intensity THRR unchanged      Progression   Progression Continue to progress workloads to maintain intensity without signs/symptoms of physical distress.    Average METs 2.6      Resistance Training   Training Prescription Yes    Weight 2 lbs    Reps 10-15    Time 10 Minutes      Interval Training   Interval Training No      NuStep   Level 2    SPM 85    Minutes 30    METs 2.6      Home Exercise Plan   Plans to continue exercise at [employer] (comment)   Walking, gym at her residence   Frequency Add 2 additional days to program exercise sessions.    Initial Home Exercises Provided 12/20/23          Nutrition:  Target Goals: Understanding of nutrition guidelines, daily intake of sodium 1500mg , cholesterol 200mg , calories 30% from fat and 7% or less from saturated fats, daily to have 5 or more servings of fruits and vegetables.  Biometrics:  Pre Biometrics - 11/28/23 1200       Pre Biometrics   Height 5' 1 (1.549 m)    Waist Circumference 29.8 inches    Hip Circumference 32.5 inches    Waist to Hip Ratio 0.92 %    Triceps Skinfold 9 mm    % Body Fat 27.3 %    Grip Strength 12 kg    Flexibility 13.5 in    Single Leg Stand 3 seconds           Nutrition Therapy Plan and Nutrition Goals:  Nutrition Therapy & Goals - 01/20/24 1110       Nutrition Therapy    Diet Heart healthy/Carbohydrat consistent diet    Drug/Food Interactions Statins/Certain Fruits      Personal Nutrition Goals   Nutrition Goal Patient to identify strategies for reducing cardiovascular risk by attending the [facility] education and nutrition series weekly.   goal in progress.   Personal Goal #2 Patient to improve diet quality by using the plate method as a guide for meal planning to include lean protein/plant protein, fruits, vegetables, whole grains, nonfat dairy as part of a well-balanced diet.   goal in progress.   Personal Goal #3 Patient to identify strategies for improving blood sugar with goal <7% A1c.   goal in progress.   Comments Goals in progress. Maxwell has medical history of CABGx2, HFrEF, HTN, Hyperlipidemia, TIA, DM2. A1c is not at goal. LDL is not at goal (treated with repatha , zetia , crestor ). She continues to attend the [facility] education/nutrition series regularly. She continues to work on weight gain; we have discussed multiple strategies for weight gain including increasing eating frequency, calorie density, nutrition supplements. She is up 9.7# since starting with  our program. She does report her typical adult weight is ~126#. Patient will benefit from participation in intensive cardiac rehab for nutrition education, exercise, and lifestyle modification.      Intervention Plan   Intervention Prescribe, educate and counsel regarding individualized specific dietary modifications aiming towards targeted core components such as weight, hypertension, lipid management, diabetes, heart failure and other comorbidities.;Nutrition handout(s) given to patient.    Expected Outcomes Short Term Goal: Understand basic principles of dietary content, such as calories, fat, sodium, cholesterol and nutrients.;Long Term Goal: Adherence to prescribed nutrition plan.          Nutrition Assessments:  MEDIFICTS Score Key: >=70 Need to make dietary changes  40-70 Heart Healthy  Diet <= 40 Therapeutic Level Cholesterol Diet    Picture Your Plate Scores: <59 Unhealthy dietary pattern with much room for improvement. 41-50 Dietary pattern unlikely to meet recommendations for good health and room for improvement. 51-60 More healthful dietary pattern, with some room for improvement.  >60 Healthy dietary pattern, although there may be some specific behaviors that could be improved.    Nutrition Goals Re-Evaluation:  Nutrition Goals Re-Evaluation     Row Name 12/02/23 1102 12/20/23 1058 01/20/24 1110         Goals   Current Weight 106 lb 0.7 oz (48.1 kg) 111 lb 1.8 oz (50.4 kg) 115 lb 11.9 oz (52.5 kg)     Comment A1c 7.3, cholesterol 251, triglycerides 193, LDL 157. BMI is low/normal for age. A1c 7.3, cholesterol 251, triglycerides 193, LDL 157. BMI is low/normal for age. A1c 7.3, cholesterol 251, triglycerides 193, LDL 157. BMI is low/normal for age.     Expected Outcome Keishana has medical history of CABGx2, HFrEF, HTN, Hyperlipidemia, TIA, DM2. A1c is not at goal. LDL is not at goal (treated with repatha , zetia , crestor ). Patient will benefit from participation in intensive cardiac rehab for nutrition education, exercise, and lifestyle modification. Goals in progress. Aasha has medical history of CABGx2, HFrEF, HTN, Hyperlipidemia, TIA, DM2. A1c is not at goal. LDL is not at goal (treated with repatha , zetia , crestor ). She continues to work on weight gain; we have discussed multiple strategies for weight gain including increasing eating frequency, calorie density, nutrition supplements. She is up 5.1# since starting with our program. She does report her typical adult weight is ~126#. Patient will benefit from participation in intensive cardiac rehab for nutrition education, exercise, and lifestyle modification. Goals in progress. Jissell has medical history of CABGx2, HFrEF, HTN, Hyperlipidemia, TIA, DM2. A1c is not at goal. LDL is not at goal (treated with repatha ,  zetia , crestor ). She continues to attend the [facility] education/nutrition series regularly. She continues to work on weight gain; we have discussed multiple strategies for weight gain including increasing eating frequency, calorie density, nutrition supplements. She is up 9.7# since starting with our program. She does report her typical adult weight is ~126#. Patient will benefit from participation in intensive cardiac rehab for nutrition education, exercise, and lifestyle modification.        Nutrition Goals Re-Evaluation:  Nutrition Goals Re-Evaluation     Row Name 12/02/23 1102 12/20/23 1058 01/20/24 1110         Goals   Current Weight 106 lb 0.7 oz (48.1 kg) 111 lb 1.8 oz (50.4 kg) 115 lb 11.9 oz (52.5 kg)     Comment A1c 7.3, cholesterol 251, triglycerides 193, LDL 157. BMI is low/normal for age. A1c 7.3, cholesterol 251, triglycerides 193, LDL 157. BMI is low/normal for age.  A1c 7.3, cholesterol 251, triglycerides 193, LDL 157. BMI is low/normal for age.     Expected Outcome Jeree has medical history of CABGx2, HFrEF, HTN, Hyperlipidemia, TIA, DM2. A1c is not at goal. LDL is not at goal (treated with repatha , zetia , crestor ). Patient will benefit from participation in intensive cardiac rehab for nutrition education, exercise, and lifestyle modification. Goals in progress. Alexzandra has medical history of CABGx2, HFrEF, HTN, Hyperlipidemia, TIA, DM2. A1c is not at goal. LDL is not at goal (treated with repatha , zetia , crestor ). She continues to work on weight gain; we have discussed multiple strategies for weight gain including increasing eating frequency, calorie density, nutrition supplements. She is up 5.1# since starting with our program. She does report her typical adult weight is ~126#. Patient will benefit from participation in intensive cardiac rehab for nutrition education, exercise, and lifestyle modification. Goals in progress. Dene has medical history of CABGx2, HFrEF, HTN,  Hyperlipidemia, TIA, DM2. A1c is not at goal. LDL is not at goal (treated with repatha , zetia , crestor ). She continues to attend the [facility] education/nutrition series regularly. She continues to work on weight gain; we have discussed multiple strategies for weight gain including increasing eating frequency, calorie density, nutrition supplements. She is up 9.7# since starting with our program. She does report her typical adult weight is ~126#. Patient will benefit from participation in intensive cardiac rehab for nutrition education, exercise, and lifestyle modification.        Nutrition Goals Discharge (Final Nutrition Goals Re-Evaluation):  Nutrition Goals Re-Evaluation - 01/20/24 1110       Goals   Current Weight 115 lb 11.9 oz (52.5 kg)    Comment A1c 7.3, cholesterol 251, triglycerides 193, LDL 157. BMI is low/normal for age.    Expected Outcome Goals in progress. Lexani has medical history of CABGx2, HFrEF, HTN, Hyperlipidemia, TIA, DM2. A1c is not at goal. LDL is not at goal (treated with repatha , zetia , crestor ). She continues to attend the P[employer]in education/nutrition series regularly. She continues to work on weight gain; we have discussed multiple strategies for weight gain including increasing eating frequency, calorie density, nutrition supplements. She is up 9.7# since starting with our program. She does report her typical adult weight is ~126#. Patient will benefit from participation in intensive cardiac rehab for nutrition education, exercise, and lifestyle modification.          Psychosocial: Target Goals: Acknowledge presence or absence of significant depression and/or stress, maximize coping skills, provide positive support system. Participant is able to verbalize types and ability to use techniques and skills needed for reducing stress and depression.  Initial Review & Psychosocial Screening:  Initial Psych Review & Screening - 11/28/23 1212       Initial Review    Current issues with Current Anxiety/Panic;Current Stress Concerns    Source of Stress Concerns Family;Financial    Comments Pt does have some mild stress concerns, but has good family support with her niece, sister, and daughter. She feels her stress is controlled and has no additional needs at this time      Family Dynamics   Good Support System? Yes    Concerns Recent loss of significant other    Comments Mother and brother recently      Barriers   Psychosocial barriers to participate in program The patient should benefit from training in stress management and relaxation.      Screening Interventions   Interventions Encouraged to exercise;To provide support and resources with identified psychosocial needs;Provide feedback about  the scores to participant    Expected Outcomes Short Term goal: Utilizing psychosocial counselor, staff and physician to assist with identification of specific Stressors or current issues interfering with healing process. Setting desired goal for each stressor or current issue identified.;Long Term Goal: Stressors or current issues are controlled or eliminated.;Short Term goal: Identification and review with participant of any Quality of Life or Depression concerns found by scoring the questionnaire.;Long Term goal: The participant improves quality of Life and PHQ9 Scores as seen by post scores and/or verbalization of changes          Quality of Life Scores:  Quality of Life - 11/28/23 1216       Quality of Life   Select Quality of Life      Quality of Life Scores   Health/Function Pre 24.92 %    Socioeconomic Pre 25.71 %    Psych/Spiritual Pre 30 %    Family Pre 30 %    GLOBAL Pre 26.9 %         Scores of 19 and below usually indicate a poorer quality of life in these areas.  A difference of  2-3 points is a clinically meaningful difference.  A difference of 2-3 points in the total score of the Quality of Life Index has been associated with significant  improvement in overall quality of life, self-image, physical symptoms, and general health in studies assessing change in quality of life.  PHQ-9: Review Flowsheet  More data exists      12/11/2023 11/28/2023 11/08/2023 05/02/2023 12/10/2022  Depression screen PHQ 2/9  Decreased Interest 0 0 0 0 0 0  Down, Depressed, Hopeless 0 0 0 1 0 0  PHQ - 2 Score 0 0 0 1 0 0  Altered sleeping - 1 - 0 0 -  Tired, decreased energy - 1 - 1 0 -  Change in appetite - 1 - 0 1 -  Feeling bad or failure about yourself  - 0 - 0 0 -  Trouble concentrating - 0 - 0 0 -  Moving slowly or fidgety/restless - 0 - 0 0 -  Suicidal thoughts - 0 - 0 0 -  PHQ-9 Score - 3 - 2 1 -  Difficult doing work/chores Not difficult at all Not difficult at all - - -    Details       Multiple values from one day are sorted in reverse-chronological order        Interpretation of Total Score  Total Score Depression Severity:  1-4 = Minimal depression, 5-9 = Mild depression, 10-14 = Moderate depression, 15-19 = Moderately severe depression, 20-27 = Severe depression   Psychosocial Evaluation and Intervention:   Psychosocial Re-Evaluation:  Psychosocial Re-Evaluation     Row Name 12/02/23 1725 12/06/23 0930 12/31/23 1440 02/04/24 1431       Psychosocial Re-Evaluation   Current issues with Current Anxiety/Panic;Current Stress Concerns Current Anxiety/Panic;Current Stress Concerns Current Anxiety/Panic;Current Stress Concerns Current Anxiety/Panic;Current Stress Concerns    Comments Tjuana did not voice any increased concerns or stressors on her first day of exercise. 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 Discussed PHQ9. Discussed sleep hygiene. Guadalupe has had low energy is enjoying participating in cardiac rehab. Niketa says her appetite has improved. Kalayna wants to gain weight. Anicka has not voiced any increased  concerns or stressors during exercise at cardiac rehab. Rakel will complete cardiac  rehab on 02/19/24.    Expected Outcomes Idabelle will have controlled or decreased stressors upon completion of cardiac rehab Cherell will have controlled or decreased stressors upon completion of cardiac rehab Kathia will have controlled or decreased stressors upon completion of cardiac rehab Bristyn will have controlled or decreased stressors upon completion of cardiac rehab    Interventions Stress management education;Relaxation education;Encouraged to attend Cardiac Rehabilitation for the exercise Stress management education;Relaxation education;Encouraged to attend Cardiac Rehabilitation for the exercise Stress management education;Relaxation education;Encouraged to attend Cardiac Rehabilitation for the exercise Stress management education;Relaxation education;Encouraged to attend Cardiac Rehabilitation for the exercise    Continue Psychosocial Services  Follow up required by staff Follow up required by staff Follow up required by staff Follow up required by staff      Initial Review   Source of Stress Concerns Chronic Illness;Financial;Family Chronic Illness;Financial;Family Chronic Illness;Financial;Family Chronic Illness;Financial;Family    Comments Will continue to monitor and offer support as needed. Will continue to monitor and offer support as needed. Will continue to monitor and offer support as needed. Will continue to monitor and offer support as needed.       Psychosocial Discharge (Final Psychosocial Re-Evaluation):  Psychosocial Re-Evaluation - 02/04/24 1431       Psychosocial Re-Evaluation   Current issues with Current Anxiety/Panic;Current Stress Concerns    Comments Shaquitta has not voiced any increased concerns or stressors during exercise at cardiac rehab. Keondra will complete cardiac rehab on 02/19/24.    Expected Outcomes Camylle will have controlled or decreased stressors upon completion  of cardiac rehab    Interventions Stress management education;Relaxation education;Encouraged to attend Cardiac Rehabilitation for the exercise    Continue Psychosocial Services  Follow up required by staff      Initial Review   Source of Stress Concerns Chronic Illness;Financial;Family    Comments Will continue to monitor and offer support as needed.          Vocational Rehabilitation: Provide vocational rehab assistance to qualifying candidates.   Vocational Rehab Evaluation & Intervention:  Vocational Rehab - 11/28/23 1222       Initial Vocational Rehab Evaluation & Intervention   Assessment shows need for Vocational Rehabilitation No   She has her return to work papers and has no additional needs at this time         Education: Education Goals: Education classes will be provided on a weekly basis, covering required topics. Participant will state understanding/return demonstration of topics presented.    Education     Row Name 12/02/23 1300     Education   Cardiac Education Topics P[facility]ikin   Glass blower/designer Nutrition   Nutrition Workshop Targeting Your Nutrition Priorities   Instruction Review Code 1- Verbalizes Understanding   Class Start Time 1150   Class Stop Time 1230   Class Time Calculation (min) 40 min    Row Name 12/06/23 1200     Education   Cardiac Education Topics [facility]in   Psychologist, forensic General Education   General Education Hypertension and Heart Disease   Instruction Review Code 1- Verbalizes Understanding   Class Start Time 1155   Class Stop Time 1232   Class Time Calculation (min) 37 min    Row Name 12/09/23 1200     Education   Cardiac Education Topics [facility]in   [employer]  Workshops   Biomedical scientist Psychosocial   Psychosocial Workshop Focused Goals, Sustainable Changes   Instruction  Review Code 1- Verbalizes Understanding   Class Start Time 1155   Class Stop Time 1235   Class Time Calculation (min) 40 min    Row Name 12/16/23 1100     Education   Cardiac Education Topics Pritikin   Psychologist, forensic Exercise Education   Exercise Education Biomechanial Limitations   Instruction Review Code 1- Verbalizes Understanding   Class Start Time 1155   Class Stop Time 1235   Class Time Calculation (min) 40 min    Row Name 12/18/23 1300     Education   Cardiac Education Topics Pritikin   Customer service manager   Weekly Topic Fast Evening Meals   Instruction Review Code 1- Verbalizes Understanding   Class Start Time 1145   Class Stop Time 1225   Class Time Calculation (min) 40 min    Row Name 12/20/23 1100     Education   Cardiac Education Topics Pritikin   Licensed conveyancer Nutrition   Nutrition Vitamins and Minerals   Instruction Review Code 1- Verbalizes Understanding   Class Start Time 1145   Class Stop Time 1225   Class Time Calculation (min) 40 min    Row Name 12/23/23 1600     Education   Cardiac Education Topics Pritikin   Musician   Nutrition Workshop Fueling a Forensic psychologist   Instruction Review Code 1- Tax inspector   Class Start Time 1145   Class Stop Time 1230   Class Time Calculation (min) 45 min    Row Name 01/06/24 1100     Education   Cardiac Education Topics Pritikin   Hospital doctor Education   General Education Hypertension and Heart Disease   Instruction Review Code 1- Verbalizes Understanding   Class Start Time 1150   Class Stop Time 1225   Class Time Calculation (min) 35 min    Row Name 01/10/24 1300     Education   Cardiac Education Topics  [facility]in   [employer] Workshops     Workshops   Educator Exercise Physiologist   Select Exercise   Exercise Workshop Managing Heart Disease: Your Path to a Healthier Heart   Instruction Review Code 1- Verbalizes Understanding   Class Start Time 1148   Class Stop Time 1239   Class Time Calculation (min) 51 min    Row Name 01/13/24 1300     Education   Cardiac Education Topics Pritikin   Geographical information systems officer Psychosocial   Psychosocial Workshop From Head to Heart: The Power of a Healthy Outlook   Instruction Review Code 1- Verbalizes Understanding   Class Start Time 1152   Class Stop Time 1234   Class Time Calculation (min) 42 min    Row Name 01/15/24 1100     Education   Cardiac Education Topics Pritikin   Customer service manager   Weekly Topic Tasty Appetizers and Snacks  Instruction Review Code 1- Verbalizes Understanding   Class Start Time 1145   Class Stop Time 1225   Class Time Calculation (min) 40 min    Row Name 01/17/24 1200     Education   Cardiac Education Topics Pritikin   Hospital doctor Education   General Education Heart Disease Risk Reduction   Instruction Review Code 1- Verbalizes Understanding   Class Start Time 1150   Class Stop Time 1240   Class Time Calculation (min) 50 min    Row Name 01/20/24 1300     Education   Cardiac Education Topics [facility]ikin   Nurse, children's Exercise Physiologist   Select Psychosocial   Psychosocial Healthy Minds, Bodies, Hearts   Instruction Review Code 1- Verbalizes Understanding   Class Start Time 1157   Class Stop Time 1233   Class Time Calculation (min) 36 min    Row Name 01/22/24 1600     Education   Cardiac Education Topics Pritikin   Customer service manager   Weekly  Topic Adding Flavor - Sodium-Free   Instruction Review Code 1- Verbalizes Understanding   Class Start Time 1140   Class Stop Time 1215   Class Time Calculation (min) 35 min    Row Name 01/27/24 1500     Education   Cardiac Education Topics Pritikin   Glass blower/designer Nutrition   Nutrition Workshop Label Reading   Instruction Review Code 1- Verbalizes Understanding   Class Start Time 1145   Class Stop Time 1230   Class Time Calculation (min) 45 min    Row Name 01/29/24 1000     Education   Cardiac Education Topics Pritikin   Customer service manager   Weekly Topic Fast and Healthy Breakfasts   Instruction Review Code 1- Verbalizes Understanding      Core Videos: Exercise    Move It!  Clinical staff conducted group or individual video education with verbal and written material and guidebook.  Patient learns the recommended Pritikin exercise program. Exercise with the goal of living a long, healthy life. Some of the health benefits of exercise include controlled diabetes, healthier blood pressure levels, improved cholesterol levels, improved heart and lung capacity, improved sleep, and better body composition. Everyone should speak with their doctor before starting or changing an exercise routine.  Biomechanical Limitations Clinical staff conducted group or individual video education with verbal and written material and guidebook.  Patient learns how biomechanical limitations can impact exercise and how we can mitigate and possibly overcome limitations to have an impactful and balanced exercise routine.  Body Composition Clinical staff conducted group or individual video education with verbal and written material and guidebook.  Patient learns that body composition (ratio of muscle mass to fat mass) is a key component to assessing overall fitness, rather than body weight alone. Increased fat  mass, especially visceral belly fat, can put us  at increased risk for metabolic syndrome, type 2 diabetes, heart disease, and even death. It is recommended to combine diet and exercise (cardiovascular and resistance training) to improve your body composition. Seek guidance from your physician and exercise physiologist before implementing an exercise routine.  Exercise Action Plan Clinical staff conducted  group or individual video education with verbal and written material and guidebook.  Patient learns the recommended strategies to achieve and enjoy long-term exercise adherence, including variety, self-motivation, self-efficacy, and positive decision making. Benefits of exercise include fitness, good health, weight management, more energy, better sleep, less stress, and overall well-being.  Medical   Heart Disease Risk Reduction Clinical staff conducted group or individual video education with verbal and written material and guidebook.  Patient learns our heart is our most vital organ as it circulates oxygen, nutrients, white blood cells, and hormones throughout the entire body, and carries waste away. Data supports a plant-based eating plan like the Pritikin Program for its effectiveness in slowing progression of and reversing heart disease. The video provides a number of recommendations to address heart disease.   Metabolic Syndrome and Belly Fat  Clinical staff conducted group or individual video education with verbal and written material and guidebook.  Patient learns what metabolic syndrome is, how it leads to heart disease, and how one can reverse it and keep it from coming back. You have metabolic syndrome if you have 3 of the following 5 criteria: abdominal obesity, high blood pressure, high triglycerides, low HDL cholesterol, and high blood sugar.  Hypertension and Heart Disease Clinical staff conducted group or individual video education with verbal and written material and guidebook.   Patient learns that high blood pressure, or hypertension, is very common in the [place] . Hypertension is largely due to excessive salt intake, but other important risk factors include being overweight, physical inactivity, drinking too much alcohol, smoking, and not eating enough potassium from fruits and vegetables. High blood pressure is a leading risk factor for heart attack, stroke, congestive heart failure, dementia, kidney failure, and premature death. Long-term effects of excessive salt intake include stiffening of the arteries and thickening of heart muscle and organ damage. Recommendations include ways to reduce hypertension and the risk of heart disease.  Diseases of Our Time - Focusing on Diabetes Clinical staff conducted group or individual video education with verbal and written material and guidebook.  Patient learns why the best way to stop diseases of our time is prevention, through food and other lifestyle changes. Medicine (such as prescription pills and surgeries) is often only a Band-Aid on the problem, not a long-term solution. Most common diseases of our time include obesity, type 2 diabetes, hypertension, heart disease, and cancer. The Pritikin Program is recommended and has been proven to help reduce, reverse, and/or prevent the damaging effects of metabolic syndrome.  Nutrition   Overview of the Pritikin Eating Plan  Clinical staff conducted group or individual video education with verbal and written material and guidebook.  Patient learns about the Pritikin Eating Plan for disease risk reduction. The Pritikin Eating Plan emphasizes a wide variety of unrefined, minimally-processed carbohydrates, like fruits, vegetables, whole grains, and legumes. Go, Caution, and Stop food choices are explained. Plant-based and lean animal proteins are emphasized. Rationale provided for low sodium intake for blood pressure control, low added sugars for blood sugar stabilization, and low  added fats and oils for coronary artery disease risk reduction and weight management.  Calorie Density  Clinical staff conducted group or individual video education with verbal and written material and guidebook.  Patient learns about calorie density and how it impacts the Pritikin Eating Plan. Knowing the characteristics of the food you choose will help you decide whether those foods will lead to weight gain or weight loss, and whether you want to consume more or  less of them. Weight loss is usually a side effect of the Pritikin Eating Plan because of its focus on low calorie-dense foods.  Label Reading  Clinical staff conducted group or individual video education with verbal and written material and guidebook.  Patient learns about the Pritikin recommended label reading guidelines and corresponding recommendations regarding calorie density, added sugars, sodium content, and whole grains.  Dining Out - Part 1  Clinical staff conducted group or individual video education with verbal and written material and guidebook.  Patient learns that restaurant meals can be sabotaging because they can be so high in calories, fat, sodium, and/or sugar. Patient learns recommended strategies on how to positively address this and avoid unhealthy pitfalls.  Facts on Fats  Clinical staff conducted group or individual video education with verbal and written material and guidebook.  Patient learns that lifestyle modifications can be just as effective, if not more so, as many medications for lowering your risk of heart disease. A Pritikin lifestyle can help to reduce your risk of inflammation and atherosclerosis (cholesterol build-up, or plaque, in the artery walls). Lifestyle interventions such as dietary choices and physical activity address the cause of atherosclerosis. A review of the types of fats and their impact on blood cholesterol levels, along with dietary recommendations to reduce fat intake is also  included.  Nutrition Action Plan  Clinical staff conducted group or individual video education with verbal and written material and guidebook.  Patient learns how to incorporate Pritikin recommendations into their lifestyle. Recommendations include planning and keeping personal health goals in mind as an important part of their success.  Healthy Mind-Set    Healthy Minds, Bodies, Hearts  Clinical staff conducted group or individual video education with verbal and written material and guidebook.  Patient learns how to identify when they are stressed. Video will discuss the impact of that stress, as well as the many benefits of stress management. Patient will also be introduced to stress management techniques. The way we think, act, and feel has an impact on our hearts.  How Our Thoughts Can Heal Our Hearts  Clinical staff conducted group or individual video education with verbal and written material and guidebook.  Patient learns that negative thoughts can cause depression and anxiety. This can result in negative lifestyle behavior and serious health problems. Cognitive behavioral therapy is an effective method to help control our thoughts in order to change and improve our emotional outlook.  Additional Videos:  Exercise    Improving Performance  Clinical staff conducted group or individual video education with verbal and written material and guidebook.  Patient learns to use a non-linear approach by alternating intensity levels and lengths of time spent exercising to help burn more calories and lose more body fat. Cardiovascular exercise helps improve heart health, metabolism, hormonal balance, blood sugar control, and recovery from fatigue. Resistance training improves strength, endurance, balance, coordination, reaction time, metabolism, and muscle mass. Flexibility exercise improves circulation, posture, and balance. Seek guidance from your physician and exercise physiologist before  implementing an exercise routine and learn your capabilities and proper form for all exercise.  Introduction to Yoga  Clinical staff conducted group or individual video education with verbal and written material and guidebook.  Patient learns about yoga, a discipline of the coming together of mind, breath, and body. The benefits of yoga include improved flexibility, improved range of motion, better posture and core strength, increased lung function, weight loss, and positive self-image. Yoga's heart health benefits include lowered blood pressure,  healthier heart rate, decreased cholesterol and triglyceride levels, improved immune function, and reduced stress. Seek guidance from your physician and exercise physiologist before implementing an exercise routine and learn your capabilities and proper form for all exercise.  Medical   Aging: Enhancing Your Quality of Life  Clinical staff conducted group or individual video education with verbal and written material and guidebook.  Patient learns key strategies and recommendations to stay in good physical health and enhance quality of life, such as prevention strategies, having an advocate, securing a Health Care Proxy and Power of Attorney, and keeping a list of medications and system for tracking them. It also discusses how to avoid risk for bone loss.  Biology of Weight Control  Clinical staff conducted group or individual video education with verbal and written material and guidebook.  Patient learns that weight gain occurs because we consume more calories than we burn (eating more, moving less). Even if your body weight is normal, you may have higher ratios of fat compared to muscle mass. Too much body fat puts you at increased risk for cardiovascular disease, heart attack, stroke, type 2 diabetes, and obesity-related cancers. In addition to exercise, following the Pritikin Eating Plan can help reduce your risk.  Decoding Lab Results  Clinical staff  conducted group or individual video education with verbal and written material and guidebook.  Patient learns that lab test reflects one measurement whose values change over time and are influenced by many factors, including medication, stress, sleep, exercise, food, hydration, pre-existing medical conditions, and more. It is recommended to use the knowledge from this video to become more involved with your lab results and evaluate your numbers to speak with your doctor.   Diseases of Our Time - Overview  Clinical staff conducted group or individual video education with verbal and written material and guidebook.  Patient learns that according to the CDC, 50% to 70% of chronic diseases (such as obesity, type 2 diabetes, elevated lipids, hypertension, and heart disease) are avoidable through lifestyle improvements including healthier food choices, listening to satiety cues, and increased physical activity.  Sleep Disorders Clinical staff conducted group or individual video education with verbal and written material and guidebook.  Patient learns how good quality and duration of sleep are important to overall health and well-being. Patient also learns about sleep disorders and how they impact health along with recommendations to address them, including discussing with a physician.  Nutrition  Dining Out - Part 2 Clinical staff conducted group or individual video education with verbal and written material and guidebook.  Patient learns how to plan ahead and communicate in order to maximize their dining experience in a healthy and nutritious manner. Included are recommended food choices based on the type of restaurant the patient is visiting.   Fueling a Banker conducted group or individual video education with verbal and written material and guidebook.  There is a strong connection between our food choices and our health. Diseases like obesity and type 2 diabetes are very  prevalent and are in large-part due to lifestyle choices. The Pritikin Eating Plan provides plenty of food and hunger-curbing satisfaction. It is easy to follow, affordable, and helps reduce health risks.  Menu Workshop  Clinical staff conducted group or individual video education with verbal and written material and guidebook.  Patient learns that restaurant meals can sabotage health goals because they are often packed with calories, fat, sodium, and sugar. Recommendations include strategies to plan ahead and to communicate  with the manager, chef, or server to help order a healthier meal.  Planning Your Eating Strategy  Clinical staff conducted group or individual video education with verbal and written material and guidebook.  Patient learns about the Pritikin Eating Plan and its benefit of reducing the risk of disease. The Pritikin Eating Plan does not focus on calories. Instead, it emphasizes high-quality, nutrient-rich foods. By knowing the characteristics of the foods, we choose, we can determine their calorie density and make informed decisions.  Targeting Your Nutrition Priorities  Clinical staff conducted group or individual video education with verbal and written material and guidebook.  Patient learns that lifestyle habits have a tremendous impact on disease risk and progression. This video provides eating and physical activity recommendations based on your personal health goals, such as reducing LDL cholesterol, losing weight, preventing or controlling type 2 diabetes, and reducing high blood pressure.  Vitamins and Minerals  Clinical staff conducted group or individual video education with verbal and written material and guidebook.  Patient learns different ways to obtain key vitamins and minerals, including through a recommended healthy diet. It is important to discuss all supplements you take with your doctor.   Healthy Mind-Set    Smoking Cessation  Clinical staff conducted group  or individual video education with verbal and written material and guidebook.  Patient learns that cigarette smoking and tobacco addiction pose a serious health risk which affects millions of people. Stopping smoking will significantly reduce the risk of heart disease, lung disease, and many forms of cancer. Recommended strategies for quitting are covered, including working with your doctor to develop a successful plan.  Culinary   Becoming a Set designer conducted group or individual video education with verbal and written material and guidebook.  Patient learns that cooking at home can be healthy, cost-effective, quick, and puts them in control. Keys to cooking healthy recipes will include looking at your recipe, assessing your equipment needs, planning ahead, making it simple, choosing cost-effective seasonal ingredients, and limiting the use of added fats, salts, and sugars.  Cooking - Breakfast and Snacks  Clinical staff conducted group or individual video education with verbal and written material and guidebook.  Patient learns how important breakfast is to satiety and nutrition through the entire day. Recommendations include key foods to eat during breakfast to help stabilize blood sugar levels and to prevent overeating at meals later in the day. Planning ahead is also a key component.  Cooking - Educational psychologist conducted group or individual video education with verbal and written material and guidebook.  Patient learns eating strategies to improve overall health, including an approach to cook more at home. Recommendations include thinking of animal protein as a side on your plate rather than center stage and focusing instead on lower calorie dense options like vegetables, fruits, whole grains, and plant-based proteins, such as beans. Making sauces in large quantities to freeze for later and leaving the skin on your vegetables are also recommended to maximize  your experience.  Cooking - Healthy Salads and Dressing Clinical staff conducted group or individual video education with verbal and written material and guidebook.  Patient learns that vegetables, fruits, whole grains, and legumes are the foundations of the Pritikin Eating Plan. Recommendations include how to incorporate each of these in flavorful and healthy salads, and how to create homemade salad dressings. Proper handling of ingredients is also covered. Cooking - Soups and Restaurant manager, fast food  conducted group or individual video education with verbal and written material and guidebook.  Patient learns that Pritikin soups and desserts make for easy, nutritious, and delicious snacks and meal components that are low in sodium, fat, sugar, and calorie density, while high in vitamins, minerals, and filling fiber. Recommendations include simple and healthy ideas for soups and desserts.   Overview     The Pritikin Solution Program Overview Clinical staff conducted group or individual video education with verbal and written material and guidebook.  Patient learns that the results of the Pritikin Program have been documented in more than 100 articles published in peer-reviewed journals, and the benefits include reducing risk factors for (and, in some cases, even reversing) high cholesterol, high blood pressure, type 2 diabetes, obesity, and more! An overview of the three key pillars of the Pritikin Program will be covered: eating well, doing regular exercise, and having a healthy mind-set.  WORKSHOPS  Exercise: Exercise Basics: Building Your Action Plan Clinical staff led group instruction and group discussion with PowerPoint presentation and patient guidebook. To enhance the learning environment the use of posters, models and videos may be added. At the conclusion of this workshop, patients will comprehend the difference between physical activity and exercise, as well  as the benefits of incorporating both, into their routine. Patients will understand the FITT (Frequency, Intensity, Time, and Type) principle and how to use it to build an exercise action plan. In addition, safety concerns and other considerations for exercise and cardiac rehab will be addressed by the presenter. The purpose of this lesson is to promote a comprehensive and effective weekly exercise routine in order to improve patients' overall level of fitness.   Managing Heart Disease: Your Path to a Healthier Heart Clinical staff led group instruction and group discussion with PowerPoint presentation and patient guidebook. To enhance the learning environment the use of posters, models and videos may be added.At the conclusion of this workshop, patients will understand the anatomy and physiology of the heart. Additionally, they will understand how Pritikin's three pillars impact the risk factors, the progression, and the management of heart disease.  The purpose of this lesson is to provide a high-level overview of the heart, heart disease, and how the Pritikin lifestyle positively impacts risk factors.  Exercise Biomechanics Clinical staff led group instruction and group discussion with PowerPoint presentation and patient guidebook. To enhance the learning environment the use of posters, models and videos may be added. Patients will learn how the structural parts of their bodies function and how these functions impact their daily activities, movement, and exercise. Patients will learn how to promote a neutral spine, learn how to manage pain, and identify ways to improve their physical movement in order to promote healthy living. The purpose of this lesson is to expose patients to common physical limitations that impact physical activity. Participants will learn practical ways to adapt and manage aches and pains, and to minimize their effect on regular exercise. Patients will learn how to  maintain good posture while sitting, walking, and lifting.  Balance Training and Fall Prevention  Clinical staff led group instruction and group discussion with PowerPoint presentation and patient guidebook. To enhance the learning environment the use of posters, models and videos may be added. At the conclusion of this workshop, patients will understand the importance of their sensorimotor skills (vision, proprioception, and the vestibular system) in maintaining their ability to balance as they age. Patients will apply a variety of balancing exercises that are appropriate for  their current level of function. Patients will understand the common causes for poor balance, possible solutions to these problems, and ways to modify their physical environment in order to minimize their fall risk. The purpose of this lesson is to teach patients about the importance of maintaining balance as they age and ways to minimize their risk of falling.  WORKSHOPS   Nutrition:  Fueling a Ship broker led group instruction and group discussion with PowerPoint presentation and patient guidebook. To enhance the learning environment the use of posters, models and videos may be added. Patients will review the foundational principles of the Pritikin Eating Plan and understand what constitutes a serving size in each of the food groups. Patients will also learn Pritikin-friendly foods that are better choices when away from home and review make-ahead meal and snack options. Calorie density will be reviewed and applied to three nutrition priorities: weight maintenance, weight loss, and weight gain. The purpose of this lesson is to reinforce (in a group setting) the key concepts around what patients are recommended to eat and how to apply these guidelines when away from home by planning and selecting Pritikin-friendly options. Patients will understand how calorie density may be adjusted for different weight management  goals.  Mindful Eating  Clinical staff led group instruction and group discussion with PowerPoint presentation and patient guidebook. To enhance the learning environment the use of posters, models and videos may be added. Patients will briefly review the concepts of the Pritikin Eating Plan and the importance of low-calorie dense foods. The concept of mindful eating will be introduced as well as the importance of paying attention to internal hunger signals. Triggers for non-hunger eating and techniques for dealing with triggers will be explored. The purpose of this lesson is to provide patients with the opportunity to review the basic principles of the Pritikin Eating Plan, discuss the value of eating mindfully and how to measure internal cues of hunger and fullness using the Hunger Scale. Patients will also discuss reasons for non-hunger eating and learn strategies to use for controlling emotional eating.  Targeting Your Nutrition Priorities Clinical staff led group instruction and group discussion with PowerPoint presentation and patient guidebook. To enhance the learning environment the use of posters, models and videos may be added. Patients will learn how to determine their genetic susceptibility to disease by reviewing their family history. Patients will gain insight into the importance of diet as part of an overall healthy lifestyle in mitigating the impact of genetics and other environmental insults. The purpose of this lesson is to provide patients with the opportunity to assess their personal nutrition priorities by looking at their family history, their own health history and current risk factors. Patients will also be able to discuss ways of prioritizing and modifying the Pritikin Eating Plan for their highest risk areas  Menu  Clinical staff led group instruction and group discussion with PowerPoint presentation and patient guidebook. To enhance the learning environment the use of posters,  models and videos may be added. Using menus brought in from [employer], or printed from [employer], patients will apply the Pritikin dining out guidelines that were presented in the [employer] video. Patients will also be able to practice these guidelines in a variety of provided scenarios. The purpose of this lesson is to provide patients with the opportunity to practice hands-on learning of the Pritikin Dining Out guidelines with actual menus and practice scenarios.  Label Reading Clinical staff led group instruction  and group discussion with PowerPoint presentation and patient guidebook. To enhance the learning environment the use of posters, models and videos may be added. Patients will review and discuss the Pritikin label reading guidelines presented in Pritikin's Label Reading Educational series video. Using fool labels brought in from local grocery stores and markets, patients will apply the label reading guidelines and determine if the packaged food meet the Pritikin guidelines. The purpose of this lesson is to provide patients with the opportunity to review, discuss, and practice hands-on learning of the Pritikin Label Reading guidelines with actual packaged food labels. Cooking School  Pritikin's [employer] are designed to teach patients ways to prepare quick, simple, and affordable recipes at home. The importance of nutrition's role in chronic disease risk reduction is reflected in its emphasis in the overall Pritikin program. By learning how to prepare essential core Pritikin Eating Plan recipes, patients will increase control over what they eat; be able to customize the flavor of foods without the use of added salt, sugar, or fat; and improve the quality of the food they consume. By learning a set of core recipes which are easily assembled, quickly prepared, and affordable, patients are more likely to prepare more healthy foods at home. These workshops  focus on convenient breakfasts, simple entres, side dishes, and desserts which can be prepared with minimal effort and are consistent with nutrition recommendations for cardiovascular risk reduction. Cooking [employer] are taught by a Armed forces logistics/support/administrative officer (RD) who has been trained by the [employer]. The chef or RD has a clear understanding of the importance of minimizing - if not completely eliminating - added fat, sugar, and sodium in recipes. Throughout the series of Cooking School Workshop sessions, patients will learn about healthy ingredients and efficient methods of cooking to build confidence in their capability to prepare    Cooking School weekly topics:  Adding Flavor- Sodium-Free  Fast and Healthy Breakfasts  Powerhouse Plant-Based Proteins  Satisfying Salads and Dressings  Simple Sides and Sauces  International Cuisine-Spotlight on the [employer] Zones  Delicious Desserts  Savory Soups  [employer] - Meals in a Astronomer Appetizers and Snacks  Comforting Weekend Breakfasts  One-Pot Wonders   Fast Evening Meals  Landscape architect Your Pritikin Plate  WORKSHOPS   Healthy Mindset (Psychosocial):  Focused Goals, Sustainable Changes Clinical staff led group instruction and group discussion with PowerPoint presentation and patient guidebook. To enhance the learning environment the use of posters, models and videos may be added. Patients will be able to apply effective goal setting strategies to establish at least one personal goal, and then take consistent, meaningful action toward that goal. They will learn to identify common barriers to achieving personal goals and develop strategies to overcome them. Patients will also gain an understanding of how our mind-set can impact our ability to achieve goals and the importance of cultivating a positive and growth-oriented mind-set. The purpose of this lesson is to provide patients with a deeper  understanding of how to set and achieve personal goals, as well as the tools and strategies needed to overcome common obstacles which may arise along the way.  From Head to Heart: The Power of a Healthy Outlook  Clinical staff led group instruction and group discussion with PowerPoint presentation and patient guidebook. To enhance the learning environment the use of posters, models and videos may be added. Patients will be able to recognize and describe the impact of emotions and mood  on physical health. They will discover the importance of self-care and explore self-care practices which may work for them. Patients will also learn how to utilize the 4 C's to cultivate a healthier outlook and better manage stress and challenges. The purpose of this lesson is to demonstrate to patients how a healthy outlook is an essential part of maintaining good health, especially as they continue their cardiac rehab journey.  Healthy Sleep for a Healthy Heart Clinical staff led group instruction and group discussion with PowerPoint presentation and patient guidebook. To enhance the learning environment the use of posters, models and videos may be added. At the conclusion of this workshop, patients will be able to demonstrate knowledge of the importance of sleep to overall health, well-being, and quality of life. They will understand the symptoms of, and treatments for, common sleep disorders. Patients will also be able to identify daytime and nighttime behaviors which impact sleep, and they will be able to apply these tools to help manage sleep-related challenges. The purpose of this lesson is to provide patients with a general overview of sleep and outline the importance of quality sleep. Patients will learn about a few of the most common sleep disorders. Patients will also be introduced to the concept of "sleep hygiene," and discover ways to self-manage certain sleeping problems through simple daily behavior changes.  Finally, the workshop will motivate patients by clarifying the links between quality sleep and their goals of heart-healthy living.   Recognizing and Reducing Stress Clinical staff led group instruction and group discussion with PowerPoint presentation and patient guidebook. To enhance the learning environment the use of posters, models and videos may be added. At the conclusion of this workshop, patients will be able to understand the types of stress reactions, differentiate between acute and chronic stress, and recognize the impact that chronic stress has on their health. They will also be able to apply different coping mechanisms, such as reframing negative self-talk. Patients will have the opportunity to practice a variety of stress management techniques, such as deep abdominal breathing, progressive muscle relaxation, and/or guided imagery.  The purpose of this lesson is to educate patients on the role of stress in their lives and to provide healthy techniques for coping with it.  Learning Barriers/Preferences:  Learning Barriers/Preferences - 11/28/23 1219       Learning Barriers/Preferences   Learning Barriers Sight;Exercise Concerns   Pt has some issues with dizziness and unsteady feeling   Learning Preferences Audio;Computer/Internet;Group Instruction;Individual Instruction;Pictoral;Skilled Demonstration;Verbal Instruction;Video;Written Material          Education Topics:  Knowledge Questionnaire Score:  Knowledge Questionnaire Score - 11/28/23 1220       Knowledge Questionnaire Score   Pre Score 19/24          Core Components/Risk Factors/Patient Goals at Admission:  Personal Goals and Risk Factors at Admission - 11/28/23 1222       Core Components/Risk Factors/Patient Goals on Admission   Diabetes Yes    Intervention Provide education about signs/symptoms and action to take for hypo/hyperglycemia.;Provide education about proper nutrition, including hydration, and  aerobic/resistive exercise prescription along with prescribed medications to achieve blood glucose in normal ranges: Fasting glucose 65-99 mg/dL    Expected Outcomes Short Term: Participant verbalizes understanding of the signs/symptoms and immediate care of hyper/hypoglycemia, proper foot care and importance of medication, aerobic/resistive exercise and nutrition plan for blood glucose control.;Long Term: Attainment of HbA1C < 7%.    Heart Failure Yes    Intervention Provide a combined  exercise and nutrition program that is supplemented with education, support and counseling about heart failure. Directed toward relieving symptoms such as shortness of breath, decreased exercise tolerance, and extremity edema.    Expected Outcomes Improve functional capacity of life;Short term: Attendance in program 2-3 days a week with increased exercise capacity. Reported lower sodium intake. Reported increased fruit and vegetable intake. Reports medication compliance.;Short term: Daily weights obtained and reported for increase. Utilizing diuretic protocols set by physician.;Long term: Adoption of self-care skills and reduction of barriers for early signs and symptoms recognition and intervention leading to self-care maintenance.    Hypertension Yes    Intervention Provide education on lifestyle modifcations including regular physical activity/exercise, weight management, moderate sodium restriction and increased consumption of fresh fruit, vegetables, and low fat dairy, alcohol moderation, and smoking cessation.;Monitor prescription use compliance.    Expected Outcomes Short Term: Continued assessment and intervention until BP is < 140/59mm HG in hypertensive participants. < 130/13mm HG in hypertensive participants with diabetes, heart failure or chronic kidney disease.;Long Term: Maintenance of blood pressure at goal levels.    Lipids Yes    Intervention Provide education and support for participant on nutrition &  aerobic/resistive exercise along with prescribed medications to achieve LDL 70mg , HDL >40mg .    Expected Outcomes Short Term: Participant states understanding of desired cholesterol values and is compliant with medications prescribed. Participant is following exercise prescription and nutrition guidelines.;Long Term: Cholesterol controlled with medications as prescribed, with individualized exercise RX and with personalized nutrition plan. Value goals: LDL < 70mg , HDL > 40 mg.    Stress Yes    Intervention Offer individual and/or small group education and counseling on adjustment to heart disease, stress management and health-related lifestyle change. Teach and support self-help strategies.;Refer participants experiencing significant psychosocial distress to appropriate mental health specialists for further evaluation and treatment. When possible, include family members and significant others in education/counseling sessions.    Expected Outcomes Short Term: Participant demonstrates changes in health-related behavior, relaxation and other stress management skills, ability to obtain effective social support, and compliance with psychotropic medications if prescribed.;Long Term: Emotional wellbeing is indicated by absence of clinically significant psychosocial distress or social isolation.    Personal Goal Other Yes    Personal Goal short term: know plan for home exercise. Long term: increase strength and energy    Intervention Will continue to monitor pt and progress WL as tolerated W/O sign or symptom    Expected Outcomes Pt will achieve her goals and increase strength          Core Components/Risk Factors/Patient Goals Review:   Goals and Risk Factor Review     Row Name 12/02/23 1728 12/06/23 0932 12/31/23 1444 02/04/24 1434       Core Components/Risk Factors/Patient Goals Review   Personal Goals Review Weight Management/Obesity;Lipids;Hypertension;Stress;Diabetes;Heart Failure Weight  Management/Obesity;Lipids;Hypertension;Stress;Diabetes;Heart Failure Weight Management/Obesity;Lipids;Hypertension;Stress;Diabetes;Heart Failure Weight Management/Obesity;Lipids;Hypertension;Stress;Diabetes;Heart Failure    Review Acelynn started cardiac rehab on 12/02/23. Kathreen did well with exercise. Entry systolic BP in the 90's asymptomatic. Exertional and exit BP WNL. Will continue to monitor BP Janisa started cardiac rehab on 12/02/23. Shallon is off to a fair start with exercise. Will continue to monitor BP. It is noted the Lum Louis NP discontinued Jatara's amlodipine  due to low BP. Vear has been doing well with exercise at  cardiac rehab. Vital sings have been stable. Undrea has increased her met level. Sonam has gained 2.8 kg as weight gain has been a goal for her. Azriella continues to  do well with exercise at  cardiac rehab. Vital sings remain stable. Nga has increased her met level. Zyana has gained 4.0  kg as weight gain has been a goal for her. Marylene will complete cardiac rehab on 02/19/24    Expected Outcomes Malcolm will continue to particpate in cardiac rehab for exercise, nutrition and lifestyle modificaiotns Telly will continue to particpate in cardiac rehab for exercise, nutrition and lifestyle modificaiotns Aleiah will continue to particpate in cardiac rehab for exercise, nutrition and lifestyle modificaiotns Porcia will continue to particpate in cardiac rehab for exercise, nutrition and lifestyle modificaiotns       Core Components/Risk Factors/Patient Goals at Discharge (Final Review):   Goals and Risk Factor Review - 02/04/24 1434       Core Components/Risk Factors/Patient Goals Review   Personal Goals Review Weight Management/Obesity;Lipids;Hypertension;Stress;Diabetes;Heart Failure    Review Renatha continues to  do well with exercise at  cardiac rehab. Vital sings remain stable. Anye has increased her met level. Luna has gained 4.0  kg as  weight gain has been a goal for her. Marchele will complete cardiac rehab on 02/19/24    Expected Outcomes Porcha will continue to particpate in cardiac rehab for exercise, nutrition and lifestyle modificaiotns          ITP Comments:  ITP Comments     Row Name 11/28/23 9062 12/02/23 1723 12/06/23 0928 12/31/23 1435 02/04/24 1428   ITP Comments Wilbert Bihari, MD: Medical Director. Introduction to the [facility] /Intensive Cardiac Rehab.  Initial orientation packet reviewed with the patient. 30 Day ITP Review. Hedi started cardiac rehab on 12/02/23. Sanaiya did well with exercise for her fitness level 30 Day ITP Review. Chakia started cardiac rehab on 12/02/23. Ninette is off to a fair start with exercise for her fitness level 30 Day ITP Review. Bradi has good participation in cardiac rehab when in attendance. 30 Day ITP Review. Amyrah continues to have good participation in cardiac rehab when in attendance. Rexine will complete cardiac rehab on 02/19/24.      Comments: See ITP Comments

## 2024-02-04 NOTE — Telephone Encounter (Signed)
 Spoke with who reports she has been under a lot of stress related to not being able to work.  Today she was at the unemployment office and started having twinges in her chest.  No SOB or radiation, maybe a little queasy.  She went home took an ASA, laid down and now is feeling better.  No current s/s.  Pt does not take any type of medication for anxiety nor does she have any SL Ntg but again, no current s/s.  She reports she does have cardiac rehab in the morning.  ED precautions reviewed and she states understanding.

## 2024-02-05 ENCOUNTER — Encounter (HOSPITAL_COMMUNITY)
Admission: RE | Admit: 2024-02-05 | Discharge: 2024-02-05 | Disposition: A | Discharge status: Home / Self Care | Source: Ambulatory Visit | Attending: Cardiovascular Disease | Admitting: Cardiovascular Disease

## 2024-02-05 ENCOUNTER — Ambulatory Visit: Payer: Self-pay | Admitting: *Deleted

## 2024-02-05 DIAGNOSIS — Z48812 Encounter for surgical aftercare following surgery on the circulatory system: Secondary | ICD-10-CM | POA: Diagnosis not present

## 2024-02-05 DIAGNOSIS — Z951 Presence of aortocoronary bypass graft: Secondary | ICD-10-CM

## 2024-02-05 NOTE — Telephone Encounter (Signed)
 FYI Only or Action Required?: FYI only for provider.  Patient was last seen in primary care on 12/10/2023 by Celestia Rosaline SQUIBB, NP.  Called Nurse Triage reporting Anxiety.  Symptoms began several days ago.  Interventions attempted: Rest, hydration, or home remedies.  Symptoms are: gradually worsening.  Triage Disposition: See Physician Within 24 Hours  Patient/caregiver understands and will follow disposition?: Yes           Copied from CRM #[id number]. Topic: Clinical - Red Word Triage >> Feb 05, 2024 12:05 PM Nathanel BROCKS wrote: Red Word that prompted transfer to Nurse Triage: pt has been having stressful moments in her personal life and its making her anxiety levels high. She was at cardiac rehab this morning and they suggested that she she call her dr and possibly get some anxiety medication.    ----------------------------------------------------------------------- From previous Reason for Contact - Medication Question: Reason for CRM: pt has been experiencing stressful moments in Reason for Disposition  Patient sounds very upset or troubled to the triager  Answer Assessment - Initial Assessment Questions Appt scheduled for tomorrow.    1. CONCERN: Did anything happen that prompted you to call today?      Cardiac rehab suggested to get medication for anxiety  2. ANXIETY SYMPTOMS: Can you describe how you (your loved one; patient) have been feeling? (e.g., tense, restless, panicky, anxious, keyed up, overwhelmed, sense of impending doom).      Anxious  3. ONSET: How long have you been feeling this way? (e.g., hours, days, weeks)     Few days  4. SEVERITY: How would you rate the level of anxiety? (e.g., 0 - 10; or mild, moderate, severe).     Moderate  5. FUNCTIONAL IMPAIRMENT: How have these feelings affected your ability to do daily activities? Have you had more difficulty than usual doing your normal daily activities? (e.g., getting better, same, worse;  self-care, school, work, interactions)     Difficulty sleeping  6. HISTORY: Have you felt this way before? Have you ever been diagnosed with an anxiety problem in the past? (e.g., generalized anxiety disorder, panic attacks, PTSD). If Yes, ask: How was this problem treated? (e.g., medicines, counseling, etc.)     Yes  7. RISK OF HARM - SUICIDAL IDEATION: Do you ever have thoughts of hurting or killing yourself? If Yes, ask:  Do you have these feelings now? Do you have a plan on how you would do this?     No  8. TREATMENT:  What has been done so far to treat this anxiety? (e.g., medicines, relaxation strategies). What has helped?     Friend has been with patient  9. THERAPIST: Do you have a counselor or therapist? If Yes, ask: What is their name?     Na  10. POTENTIAL TRIGGERS: Do you drink caffeinated beverages (e.g., coffee, colas, teas), and how much daily? Do you drink alcohol or use any drugs? Have you started any new medicines recently?       Na  11. PATIENT SUPPORT: Who is with you now? Who do you live with? Do you have family or friends who you can talk to?        Good friend and roommate 46. OTHER SYMPTOMS: Do you have any other symptoms? (e.g., feeling depressed, trouble concentrating, trouble sleeping, trouble breathing, palpitations or fast heartbeat, chest pain, sweating, nausea, or diarrhea)       Not sleeping due to situation, achy , nausea  13. PREGNANCY: Is there any  chance you are pregnant? When was your last menstrual period?       na  Protocols used: Anxiety and Panic Attack-A-AH

## 2024-02-06 ENCOUNTER — Ambulatory Visit (INDEPENDENT_AMBULATORY_CARE_PROVIDER_SITE_OTHER): Admitting: Primary Care

## 2024-02-06 ENCOUNTER — Encounter (INDEPENDENT_AMBULATORY_CARE_PROVIDER_SITE_OTHER): Payer: Self-pay | Admitting: Primary Care

## 2024-02-06 ENCOUNTER — Telehealth: Payer: Self-pay

## 2024-02-06 VITALS — BP 135/89 | Resp 19 | Ht 61.0 in | Wt 114.3 lb

## 2024-02-06 DIAGNOSIS — F411 Generalized anxiety disorder: Secondary | ICD-10-CM

## 2024-02-06 DIAGNOSIS — F430 Acute stress reaction: Secondary | ICD-10-CM | POA: Diagnosis not present

## 2024-02-06 NOTE — Telephone Encounter (Signed)
 I spoke to the patient when she was at her appointment with Rosaline Bohr, NP today.  She explained that she has been trying to cope with the loss of her mother who passed away a year ago next month as well as her oldest brother who passed away recently.  She has moved to a new apartment and is currently behind with her rent.  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   I asked her what happened when I referred her to Legal Aid last 04/2023 when she was facing a possible eviction for owing $3000 rent.  She said she never had to speak to Legal Aid, she was able to work it out with her landlord on her own.  She is now living in another apartment which is not managed by the [employer]   She said she currently pays  $1212/month for rent and her current income is $2030/month. She also owes $1300 for back rent and fees.  She said she will be fine paying the rent going forward but is not able to pay the entire $1300.  She said she  has a money order for $500 but still needs $800.  She stated that she has checked with local agencies and churches including DSS, and [employer] and no one has been able to provide any assistance.I offered to refer her to [facility] [place] again to see if they are able to help her work with her landlord to negotiate a payment plan for the overdue rent so she is not in danger of eviction.  She was in agreement and I placed the referral to Legal Aid and instructed her to call me with any questions/concerns and if she does not hear from Legal Aid.  She was very appreciative of the assistance

## 2024-02-06 NOTE — Progress Notes (Signed)
 [facility]  [name], is a 68 y.o. female  RDW:[id number]  FMW:[id number]  DOB - December 21, 1955  Chief Complaint  Patient presents with   Anxiety    Chest discomfort       Subjective:   [name] is a 68 y.o. female here today for an acute visit. Chest pain is from stress and anxiety. Called churches, [employer] walked to [employer]  seems as there are no resources available. Stomach growing hungry -offered peanut butter cracker very grateful. Apr 17, 2023 mother died and oldest brother [name] [birth year]23 the day before her birth day.    No problems updated.  Comprehensive ROS Pertinent positive and negative noted in HPI   No Known Allergies  Past Medical History:  Diagnosis Date   Anemia    hx   Aneurysm (HCC)    small, on left side of brain    Anxiety    hx of   Arthritis    generalized   CAD (coronary artery disease)    Diabetes mellitus without complication (HCC)    diet control, pt denies- on medications   GERD (gastroesophageal reflux disease)    iwht certain foods/on meds/OTC PRN meds   Heart failure with mildly reduced ejection fraction (HFmrEF) (HCC)    Hyperlipidemia    on meds   Hypertension    on meds   TIA (transient ischemic attack)    pt unaware of this hx on 06/19/2017    Current Outpatient Medications on File Prior to Visit  Medication Sig Dispense Refill   Accu-Chek Softclix Lancets lancets Use to check blood sugar once daily. 100 each 2   aspirin  EC 81 MG tablet Take 1 tablet (81 mg total) by mouth daily.     Blood Glucose Monitoring Suppl (ACCU-CHEK GUIDE ME) w/Device KIT USE TO CHECK BLOOD SUGAR ONCE DAILY 1 kit 0   clopidogrel  (PLAVIX ) 75 MG tablet Take 1 tablet (75 mg total) by mouth daily. 90 tablet 3   dapagliflozin  propanediol (FARXIGA ) 10 MG TABS tablet Take 1 tablet (10 mg total) by mouth daily before breakfast. 90 tablet 1   dicyclomine  (BENTYL ) 10 MG capsule Take 1 capsule (10 mg total) by  mouth 3 (three) times daily before meals. 90 capsule 11   DULoxetine  (CYMBALTA ) 30 MG capsule Take 1 capsule (30 mg total) by mouth daily. 180 capsule 1   Evolocumab  (REPATHA  SURECLICK) 140 MG/ML SOAJ Inject 140 mg into the skin every 14 (fourteen) days. 6 mL 3   ezetimibe  (ZETIA ) 10 MG tablet Take 1 tablet (10 mg total) by mouth daily. 90 tablet 3   fluconazole  (DIFLUCAN ) 150 MG tablet Take 1 tablet (150 mg total) by mouth daily. 1 tablet 1   furosemide  (LASIX ) 20 MG tablet Take 1 tablet (20 mg total) by mouth daily as needed. 90 tablet 3   glucose blood (ACCU-CHEK GUIDE TEST) test strip Use to check blood sugar once daily. 100 each 2   hydrOXYzine  (ATARAX ) 25 MG tablet Take 1 tablet (25 mg total) by mouth every 6 (six) hours. 12 tablet 0   JANUVIA  25 MG tablet Take 25 mg by mouth daily.     losartan -hydrochlorothiazide  (HYZAAR ) 100-25 MG tablet Take 1 tablet by mouth daily. 90 tablet 3   metoprolol  tartrate (LOPRESSOR ) 100 MG tablet TAKE 1 TABLET IN THE AM AND 1.5 TABLETS IN THE PM 225 tablet 3   potassium chloride  SA (KLOR-CON  M) 20 MEQ tablet Take 1 tablet (20 mEq total) by  mouth daily as needed (when taking furosemide  (lasix )). 90 tablet 3   pregabalin (LYRICA) 50 MG capsule Take 50 mg by mouth 3 (three) times daily.     rosuvastatin  (CRESTOR ) 40 MG tablet Take 1 tablet (40 mg total) by mouth daily. 90 tablet 3   No current facility-administered medications on file prior to visit.   Health Maintenance  Topic Date Due   Eye exam for diabetics  07/25/2018   COVID-19 Vaccine (3 - Pfizer risk series) 03/04/2020   Flu Shot  01/24/2024   Zoster (Shingles) Vaccine (2 of 2) 03/11/2024*   Yearly kidney health urinalysis for diabetes  05/01/2024   Hemoglobin A1C  05/10/2024   Complete foot exam   06/11/2024   DEXA scan (bone density measurement)  11/12/2024   Yearly kidney function blood test for diabetes  01/23/2025   Mammogram  01/30/2025   Medicare Annual Wellness Visit  01/30/2025    DTaP/Tdap/Td vaccine (2 - Td or Tdap) 04/26/2027   Colon Cancer Screening  02/13/2028   Pneumococcal Vaccine for age over 69  Completed   Hepatitis C Screening  Completed   HPV Vaccine  Aged Out   Meningitis B Vaccine  Aged Out  *Topic was postponed. The date shown is not the original due date.    Objective:   Vitals:   02/06/24 1349  BP: 135/89  Resp: 19  SpO2: 100%  Weight: 114 lb 4.8 oz (51.8 kg)  Height: 5' 1 (1.549 m)    Physical Exam Constitutional:      Comments: Crying distress  Cardiovascular:     Rate and Rhythm: Normal rate and regular rhythm.  Pulmonary:     Breath sounds: Normal breath sounds.  Abdominal:     General: Abdomen is flat.     Palpations: Abdomen is soft.  Musculoskeletal:        General: Normal range of motion.  Skin:    General: Skin is warm and dry.  Neurological:     Mental Status: She is alert and oriented to person, place, and time.  Psychiatric:        Judgment: Judgment normal.      Assessment & Plan  [name] was seen today for anxiety.  Diagnoses and all orders for this visit:  Anxiety as acute reaction to exceptional stress     Patient have been counseled extensively about nutrition and exercise. Other issues discussed during this visit include: low cholesterol diet, weight control and daily exercise, foot care, annual eye examinations at Ophthalmology, importance of adherence with medications and regular follow-up. We also discussed long term complications of uncontrolled diabetes and hypertension.     The patient was given clear instructions to go to ER or return to medical center if symptoms don't improve, worsen or new problems develop. The patient verbalized understanding. The patient was told to call to get lab results if they haven't heard anything in the next week.   This note has been created with Education officer, environmental. Any transcriptional errors are unintentional.    Marcia SHAUNNA Bohr, NP 02/09/2024, 1:30 AM

## 2024-02-07 ENCOUNTER — Encounter (HOSPITAL_COMMUNITY)
Admission: RE | Admit: 2024-02-07 | Discharge: 2024-02-07 | Disposition: A | Discharge status: Home / Self Care | Source: Ambulatory Visit | Attending: Cardiovascular Disease | Admitting: Cardiovascular Disease

## 2024-02-07 DIAGNOSIS — Z48812 Encounter for surgical aftercare following surgery on the circulatory system: Secondary | ICD-10-CM | POA: Diagnosis not present

## 2024-02-07 DIAGNOSIS — Z951 Presence of aortocoronary bypass graft: Secondary | ICD-10-CM | POA: Diagnosis not present

## 2024-02-10 ENCOUNTER — Encounter (HOSPITAL_COMMUNITY)
Admission: RE | Admit: 2024-02-10 | Discharge: 2024-02-10 | Disposition: A | Discharge status: Home / Self Care | Source: Ambulatory Visit | Attending: Cardiovascular Disease | Admitting: Cardiovascular Disease

## 2024-02-10 ENCOUNTER — Ambulatory Visit (INDEPENDENT_AMBULATORY_CARE_PROVIDER_SITE_OTHER): Admitting: Primary Care

## 2024-02-10 DIAGNOSIS — Z951 Presence of aortocoronary bypass graft: Secondary | ICD-10-CM

## 2024-02-10 DIAGNOSIS — Z48812 Encounter for surgical aftercare following surgery on the circulatory system: Secondary | ICD-10-CM | POA: Diagnosis not present

## 2024-02-12 ENCOUNTER — Other Ambulatory Visit (HOSPITAL_BASED_OUTPATIENT_CLINIC_OR_DEPARTMENT_OTHER): Payer: Self-pay | Admitting: Pharmacist

## 2024-02-12 ENCOUNTER — Encounter (HOSPITAL_COMMUNITY)
Admission: RE | Admit: 2024-02-12 | Discharge: 2024-02-12 | Disposition: A | Discharge status: Home / Self Care | Source: Ambulatory Visit | Attending: Cardiology | Admitting: Cardiology

## 2024-02-12 ENCOUNTER — Ambulatory Visit: Admitting: Nurse Practitioner

## 2024-02-12 DIAGNOSIS — Z48812 Encounter for surgical aftercare following surgery on the circulatory system: Secondary | ICD-10-CM | POA: Diagnosis not present

## 2024-02-12 DIAGNOSIS — I10 Essential (primary) hypertension: Secondary | ICD-10-CM

## 2024-02-12 DIAGNOSIS — Z951 Presence of aortocoronary bypass graft: Secondary | ICD-10-CM | POA: Diagnosis not present

## 2024-02-12 NOTE — Progress Notes (Signed)
 Pharmacy Quality Measure Review  This patient is appearing on a report for being at risk of failing the adherence measure for hypertension (ACEi/ARB) medications this calendar year.   Medication: losartan -hydrochlorothiazide   Last fill date: 02/02/2024 for 90 day supply  Insurance report was not up to date. No action needed at this time.   Herlene Fleeta Morris, PharmD, JAQUELINE, CPP Clinical Pharmacist [facility] [phone]

## 2024-02-14 ENCOUNTER — Encounter (HOSPITAL_COMMUNITY): Admission: RE | Admit: 2024-02-14 | Source: Ambulatory Visit

## 2024-02-14 ENCOUNTER — Telehealth (HOSPITAL_COMMUNITY): Payer: Self-pay

## 2024-02-14 NOTE — Telephone Encounter (Signed)
 Patient c/o for 10:15 CR class, states she has some personal issues going on and is very tired.

## 2024-02-16 ENCOUNTER — Emergency Department (HOSPITAL_COMMUNITY)
Admission: EM | Admit: 2024-02-16 | Discharge: 2024-02-16 | Disposition: A | Discharge status: Home / Self Care | Attending: Emergency Medicine | Admitting: Emergency Medicine

## 2024-02-16 ENCOUNTER — Other Ambulatory Visit: Payer: Self-pay

## 2024-02-16 ENCOUNTER — Emergency Department (HOSPITAL_COMMUNITY)

## 2024-02-16 ENCOUNTER — Encounter (HOSPITAL_COMMUNITY): Payer: Self-pay | Admitting: Emergency Medicine

## 2024-02-16 DIAGNOSIS — R918 Other nonspecific abnormal finding of lung field: Secondary | ICD-10-CM | POA: Diagnosis not present

## 2024-02-16 DIAGNOSIS — R9389 Abnormal findings on diagnostic imaging of other specified body structures: Secondary | ICD-10-CM | POA: Diagnosis not present

## 2024-02-16 DIAGNOSIS — R072 Precordial pain: Secondary | ICD-10-CM | POA: Diagnosis not present

## 2024-02-16 DIAGNOSIS — Z7901 Long term (current) use of anticoagulants: Secondary | ICD-10-CM | POA: Diagnosis not present

## 2024-02-16 DIAGNOSIS — Z7982 Long term (current) use of aspirin: Secondary | ICD-10-CM | POA: Insufficient documentation

## 2024-02-16 DIAGNOSIS — R079 Chest pain, unspecified: Secondary | ICD-10-CM | POA: Diagnosis not present

## 2024-02-16 DIAGNOSIS — Z981 Arthrodesis status: Secondary | ICD-10-CM | POA: Diagnosis not present

## 2024-02-16 LAB — BASIC METABOLIC PANEL WITH GFR
Anion gap: 9 (ref 5–15)
BUN: 16 mg/dL (ref 8–23)
CO2: 25 mmol/L (ref 22–32)
Calcium: 9.5 mg/dL (ref 8.9–10.3)
Chloride: 106 mmol/L (ref 98–111)
Creatinine, Ser: 0.91 mg/dL (ref 0.44–1.00)
GFR, Estimated: >60 mL/min (ref >60)
Glucose, Bld: 143 mg/dL — ABNORMAL HIGH (ref 70–99)
Potassium: 4.4 mmol/L (ref 3.5–5.1)
Sodium: 140 mmol/L (ref 135–145)

## 2024-02-16 LAB — CBC
HCT: 36.9 % (ref 36.0–46.0)
Hemoglobin: 11.6 g/dL — ABNORMAL LOW (ref 12.0–15.0)
MCH: 27.7 pg (ref 26.0–34.0)
MCHC: 31.4 g/dL (ref 30.0–36.0)
MCV: 88.1 fL (ref 80.0–100.0)
Platelets: 344 K/uL (ref 150–400)
RBC: 4.19 MIL/uL (ref 3.87–5.11)
RDW: 14.9 % (ref 11.5–15.5)
WBC: 9.3 K/uL (ref 4.0–10.5)
nRBC: 0 % (ref 0.0–0.2)

## 2024-02-16 LAB — TROPONIN I (HIGH SENSITIVITY)
Troponin I (High Sensitivity): 9 ng/L (ref <18)
Troponin I (High Sensitivity): 8 ng/L (ref <18)

## 2024-02-16 MED ORDER — LABETALOL HCL 5 MG/ML IV SOLN
10.0000 mg | Freq: Once | INTRAVENOUS | Status: AC
  Administered 2024-02-16: 10 mg via INTRAVENOUS
  Filled 2024-02-16: qty 4

## 2024-02-16 MED ORDER — ONDANSETRON 4 MG PO TBDP
4.0000 mg | ORAL_TABLET | Freq: Three times a day (TID) | ORAL | 0 refills | Status: AC | PRN
Start: 2024-02-16 — End: ?

## 2024-02-16 MED ORDER — ONDANSETRON HCL 4 MG/2ML IJ SOLN
4.0000 mg | Freq: Once | INTRAMUSCULAR | Status: AC
  Administered 2024-02-16: 4 mg via INTRAVENOUS
  Filled 2024-02-16: qty 2

## 2024-02-16 MED ORDER — FENTANYL CITRATE PF 50 MCG/ML IJ SOSY
50.0000 ug | PREFILLED_SYRINGE | Freq: Once | INTRAMUSCULAR | Status: AC
  Administered 2024-02-16: 50 ug via INTRAVENOUS
  Filled 2024-02-16: qty 1

## 2024-02-16 MED ORDER — ALUM & MAG HYDROXIDE-SIMETH 200-200-20 MG/5ML PO SUSP
30.0000 mL | Freq: Once | ORAL | Status: AC
  Administered 2024-02-16: 30 mL via ORAL
  Filled 2024-02-16: qty 30

## 2024-02-16 MED ORDER — PANTOPRAZOLE SODIUM 20 MG PO TBEC: 20.0000 mg | DELAYED_RELEASE_TABLET | Freq: Every day | ORAL | 0 refills | Status: DC

## 2024-02-16 MED ORDER — PANTOPRAZOLE SODIUM 40 MG IV SOLR
40.0000 mg | Freq: Once | INTRAVENOUS | Status: DC
  Filled 2024-02-16: qty 10

## 2024-02-16 NOTE — ED Provider Notes (Signed)
 [name]   CSN: [id number] Arrival date & time: 02/16/24  9672     Patient presents with: Chest Pain   [name] is a 68 y.o. female.   The history is provided by the patient.  Chest Pain [name] is a 68 y.o. female who presents to the Emergency Department complaining of chest pain. She presents the emergency department for evaluation of central chest pain described as a constant ache that is nonradiating that started around 1230 this morning. She had associated elevated blood pressure and had one episode of emesis with mild headache. No abdominal pain, difficulty breathing, dysuria, leg swelling or pain. She has had similar milder symptoms in the past but she is unsure what caused them. She is status post cabbage in January. No recent medication changes but her amlodipine  was stopped a few months ago. She did take her nighttime medications.  No tobacco, alcohol, drugs.      Prior to Admission medications   Medication Sig Start Date End Date Taking? Authorizing Provider  ondansetron  (ZOFRAN -ODT) 4 MG disintegrating tablet Take 1 tablet (4 mg total) by mouth every 8 (eight) hours as needed. 02/16/24  Yes Griselda Norris, MD  pantoprazole  (PROTONIX ) 20 MG tablet Take 1 tablet (20 mg total) by mouth daily. 02/16/24  Yes Griselda Norris, MD  Accu-Chek Softclix Lancets lancets Use to check blood sugar once daily. 12/03/23   Newlin, Enobong, MD  aspirin  EC 81 MG tablet Take 1 tablet (81 mg total) by mouth daily. 07/10/23   Barrett, Erin R, PA-C  Blood Glucose Monitoring Suppl (ACCU-CHEK GUIDE ME) w/Device KIT USE TO CHECK BLOOD SUGAR ONCE DAILY 02/04/24   Newlin, Enobong, MD  clopidogrel  (PLAVIX ) 75 MG tablet Take 1 tablet (75 mg total) by mouth daily. 11/01/23 10/31/24  Wyn Jackee VEAR Mickey., NP  dapagliflozin  propanediol (FARXIGA ) 10 MG TABS tablet Take 1 tablet (10 mg total) by mouth daily before breakfast. 11/08/23    Celestia Rosaline SQUIBB, NP  dicyclomine  (BENTYL ) 10 MG capsule Take 1 capsule (10 mg total) by mouth 3 (three) times daily before meals. 02/04/23   Aneita Gwendlyn DASEN, MD  DULoxetine  (CYMBALTA ) 30 MG capsule Take 1 capsule (30 mg total) by mouth daily. 05/02/23   Celestia Rosaline SQUIBB, NP  Evolocumab  (REPATHA  SURECLICK) 140 MG/ML SOAJ Inject 140 mg into the skin every 14 (fourteen) days. 09/13/23   Patel, Vaishali K, RPH  ezetimibe  (ZETIA ) 10 MG tablet Take 1 tablet (10 mg total) by mouth daily. 11/01/23   Wyn Jackee VEAR Mickey., NP  fluconazole  (DIFLUCAN ) 150 MG tablet Take 1 tablet (150 mg total) by mouth daily. 08/09/23   Celestia Rosaline SQUIBB, NP  furosemide  (LASIX ) 20 MG tablet Take 1 tablet (20 mg total) by mouth daily as needed. 11/01/23   Wyn Jackee VEAR Mickey., NP  glucose blood (ACCU-CHEK GUIDE TEST) test strip Use to check blood sugar once daily. 12/03/23   Newlin, Enobong, MD  hydrOXYzine  (ATARAX ) 25 MG tablet Take 1 tablet (25 mg total) by mouth every 6 (six) hours. 08/19/23   Dasie Faden, MD  JANUVIA  25 MG tablet Take 25 mg by mouth daily. 09/02/23   [provider]  losartan -hydrochlorothiazide  (HYZAAR ) 100-25 MG tablet Take 1 tablet by mouth daily. 11/01/23   Wyn Jackee VEAR Mickey., NP  metoprolol  tartrate (LOPRESSOR ) 100 MG tablet TAKE 1 TABLET IN THE AM AND 1.5 TABLETS IN THE PM 12/03/23   Wyn Jackee VEAR Mickey., NP  potassium chloride  SA (KLOR-CON  M) 20 MEQ tablet Take 1 tablet (20 mEq total) by mouth daily as needed (when taking furosemide  (lasix )). 11/01/23   Wyn Jackee VEAR Mickey., NP  pregabalin (LYRICA) 50 MG capsule Take 50 mg by mouth 3 (three) times daily. 04/30/22   [provider]  rosuvastatin  (CRESTOR ) 40 MG tablet Take 1 tablet (40 mg total) by mouth daily. 11/01/23   Wyn Jackee VEAR Mickey., NP    Allergies: Patient has no known allergies.    Review of Systems  Cardiovascular:  Positive for chest pain.  All other systems reviewed and are negative.   Updated Vital Signs BP (!) 180/105 (BP  Location: Right Arm)   Pulse 84   Temp 97.6 F (36.4 C)   Resp 13   Ht 5' 1 (1.549 m)   Wt 51 kg   SpO2 100%   BMI 21.24 kg/m   Physical Exam Vitals and nursing Hale reviewed.  Constitutional:      Appearance: She is well-developed.  HENT:     Head: Normocephalic and atraumatic.  Cardiovascular:     Rate and Rhythm: Normal rate and regular rhythm.     Heart sounds: No murmur heard. Pulmonary:     Effort: Pulmonary effort is normal. No respiratory distress.     Breath sounds: Normal breath sounds.  Abdominal:     Palpations: Abdomen is soft.     Tenderness: There is no abdominal tenderness. There is no guarding or rebound.  Musculoskeletal:        General: No tenderness.  Skin:    General: Skin is warm and dry.  Neurological:     Mental Status: She is alert and oriented to person, place, and time.  Psychiatric:        Behavior: Behavior normal.     (all labs ordered are listed, but only abnormal results are displayed) Labs Reviewed  BASIC METABOLIC PANEL WITH GFR - Abnormal; Notable for the following components:      Result Value   Glucose, Bld 143 (*)    All other components within normal limits  CBC - Abnormal; Notable for the following components:   Hemoglobin 11.6 (*)    All other components within normal limits  TROPONIN I (HIGH SENSITIVITY)  TROPONIN I (HIGH SENSITIVITY)    EKG: EKG Interpretation Date/Time:  Sunday February 16 2024 04:55:59 EDT Ventricular Rate:  85 PR Interval:  154 QRS Duration:  94 QT Interval:  381 QTC Calculation: 453 R Axis:   -39  Text Interpretation: Sinus rhythm Left axis deviation Probable anterior infarct, old Confirmed by Griselda Norris [phone]) on 02/16/2024 5:30:26 AM  Radiology: DG Chest 2 View Result Date: 02/16/2024 CLINICAL DATA:  68 year old female who woke with chest pain. EXAM: CHEST - 2 VIEW COMPARISON:  Chest radiographs 01/28/2024 and earlier. FINDINGS: PA and lateral views sero 357 hours. Chronic sternotomy  and CABG. Chronic hypo ventilation at the left lung base with elevated left hemidiaphragm. No pneumothorax or pulmonary edema. No convincing acute pleural fluid. Right lung appears stable and negative. Stable cardiac size and mediastinal contours. Stable visualized osseous structures. Prior cervical ACDF. Negative visible bowel gas. IMPRESSION: Stable. 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 No acute cardiopulmonary abnormality. Electronically Signed   By: VEAR Hurst M.D.   On: 02/16/2024 06:15     Procedures   Medications Ordered in the ED  pantoprazole  (PROTONIX ) injection 40 mg (has no administration in  time range)  labetalol  (NORMODYNE ) injection 10 mg (10 mg Intravenous Given 02/16/24 0558)  fentaNYL  (SUBLIMAZE ) injection 50 mcg (50 mcg Intravenous Given 02/16/24 0558)  ondansetron  (ZOFRAN ) injection 4 mg (4 mg Intravenous Given 02/16/24 0558)  alum & mag hydroxide-simeth (MAALOX/MYLANTA) 200-200-20 MG/5ML suspension 30 mL (30 mLs Oral Given 02/16/24 9441)                                    Medical Decision Making Amount and/or Complexity of Data Reviewed Labs: ordered. Radiology: ordered.  Risk OTC drugs. Prescription drug management.   Patient status post cabbage in January here for evaluation of central chest pain since early in the night with associated nausea and one episode of emesis. EKG is abnormal but similar when compared to prior. Troponins are negative times two. She was treated with PPI, Maalox, pain medications and antiemetic. On repeat evaluation she is significantly improved. Blood pressure improved to 120s over 80s after one dose of labetalol . No recurrent vomiting. Current picture is not consistent with ACS, PE, dissection, hypertensive urgency. Question possible reflux contributing to her symptoms. Feels she is stable for discharge home with outpatient follow-up and return  precautions.     Final diagnoses:  Precordial pain    ED Discharge Orders          Ordered    pantoprazole  (PROTONIX ) 20 MG tablet  Daily        02/16/24 0647    ondansetron  (ZOFRAN -ODT) 4 MG disintegrating tablet  Every 8 hours PRN        02/16/24 0647               Griselda Norris, MD 02/16/24 [phone]

## 2024-02-16 NOTE — ED Triage Notes (Signed)
 Patient reports waking up with chest pain at 1 am today while laying down. Endorses nausea, headache. Reports 9/10 chest pain, sharp, constant in the middle of the chest. Hx of CABG in January. Compliant w/ medications.

## 2024-02-16 NOTE — ED Notes (Signed)
 Pt discharged. Pt given discharge papers and papers explained. Pt in NAD at this time

## 2024-02-17 ENCOUNTER — Encounter (HOSPITAL_COMMUNITY): Admission: RE | Admit: 2024-02-17 | Source: Ambulatory Visit

## 2024-02-17 ENCOUNTER — Telehealth: Payer: Self-pay | Admitting: Cardiology

## 2024-02-17 ENCOUNTER — Telehealth (HOSPITAL_COMMUNITY): Payer: Self-pay

## 2024-02-17 ENCOUNTER — Ambulatory Visit: Payer: Self-pay

## 2024-02-17 NOTE — Telephone Encounter (Signed)
 Patient c/o for 10:15 CR class, states she was in ED over the weekend due to chest pain and high BP. She states her BP is still high today, 170-107. Patient is trying to schedule appt with cardiologist.

## 2024-02-17 NOTE — Telephone Encounter (Signed)
Left message and call back number.

## 2024-02-17 NOTE — Telephone Encounter (Signed)
 Patient want to know if she should go to cardiac rehab this morning. This states her BP was 170/109 this morning.   She states she was at the ER over the weekend for High BP & chest discomfort , they told her is was because of acid reflux.

## 2024-02-17 NOTE — Telephone Encounter (Signed)
 FYI Only or Action Required?: FYI only for provider.  Patient was last seen in primary care on 02/06/2024 by Celestia Rosaline SQUIBB, NP.  Called Nurse Triage reporting Hypertension.  Symptoms began several days ago.  Interventions attempted: Prescription medications: lopressor , hyzaar , lasix .  Symptoms are: unchanged.  Triage Disposition: No disposition on file.  Patient/caregiver understands and will follow disposition?:     Copied from CRM [id number]. Topic: Clinical - Red Word Triage >> Feb 17, 2024  8:52 AM Precious C wrote: [facility] that prompted transfer to Nurse Triage: HIGH BLOOD PRESSURE   This morningit read 170/107 and Saturday at ED 180/112 and chest uncomfortableness , ED gave RX for this Reason for Disposition  Systolic BP >= 160 OR Diastolic >= 100  Answer Assessment - Initial Assessment Questions 1. BLOOD PRESSURE: What is your blood pressure? Did you take at least two measurements 5 minutes apart?     Was seen in ER on Saturday and has hospital f/u scheduled for Wednesday 2. ONSET: When did you take your blood pressure?     170/107 today 3. HOW: How did you take your blood pressure? (e.g., automatic home BP monitor, visiting nurse)     Battery operated, this am  4. HISTORY: Do you have a history of high blood pressure?     Hx HTN 5. MEDICINES: Are you taking any medicines for blood pressure? Have you missed any doses recently?     denies 6. OTHER SYMPTOMS: Do you have any symptoms? (e.g., blurred vision, chest pain, difficulty breathing, headache, weakness)     On Sat before going to ER had chest pain and headache 7. PREGNANCY: Is there any chance you are pregnant? When was your last menstrual period?     na  Protocols used: Blood Pressure - High-A-AH

## 2024-02-18 ENCOUNTER — Ambulatory Visit (INDEPENDENT_AMBULATORY_CARE_PROVIDER_SITE_OTHER): Admitting: Primary Care

## 2024-02-19 ENCOUNTER — Ambulatory Visit (HOSPITAL_COMMUNITY)

## 2024-02-19 ENCOUNTER — Encounter (INDEPENDENT_AMBULATORY_CARE_PROVIDER_SITE_OTHER): Payer: Self-pay | Admitting: Primary Care

## 2024-02-19 ENCOUNTER — Ambulatory Visit (INDEPENDENT_AMBULATORY_CARE_PROVIDER_SITE_OTHER): Admitting: Primary Care

## 2024-02-19 VITALS — BP 147/91 | HR 98 | Temp 97.9°F | Resp 14 | Ht 61.0 in | Wt 116.0 lb

## 2024-02-19 DIAGNOSIS — E119 Type 2 diabetes mellitus without complications: Secondary | ICD-10-CM

## 2024-02-19 DIAGNOSIS — K219 Gastro-esophageal reflux disease without esophagitis: Secondary | ICD-10-CM | POA: Diagnosis not present

## 2024-02-19 DIAGNOSIS — I10 Essential (primary) hypertension: Secondary | ICD-10-CM | POA: Diagnosis not present

## 2024-02-19 LAB — POCT GLYCOSYLATED HEMOGLOBIN (HGB A1C): HbA1c, POC (controlled diabetic range): 6.7 % (ref 0.0–7.0)

## 2024-02-19 MED ORDER — PANTOPRAZOLE SODIUM 20 MG PO TBEC: 20.0000 mg | DELAYED_RELEASE_TABLET | Freq: Every day | ORAL | 1 refills | Status: DC

## 2024-02-19 NOTE — Patient Instructions (Signed)
 GERD in Adults: Diet Changes When you have gastroesophageal reflux disease (GERD), you may need to make changes to your diet. Choosing the right foods can help with your symptoms. Think about working with an expert in healthy eating called a dietitian. They can help you make healthy food choices. What are tips for following this plan? Reading food labels Look for foods that are low in saturated fat. Foods that may help with your symptoms include: Foods with less than 5% of daily value (DV) of fat. Foods with 0 grams of trans fat. Cooking [employer] in ways that don't use a lot of fat. These ways include: Baking. Steaming. Grilling. Broiling. To add flavor, try to use herbs that are low in spice and acidity. Avoid frying your food. Meal planning  Eat small meals often rather than eating 3 large meals each day. Eat your meals slowly in a place where you feel relaxed. If told by your health care provider, avoid: Foods that cause symptoms. Keep a food diary to keep track of foods that cause symptoms. Alcohol. Drinking a lot of liquid with meals. General instructions For 2-3 hours after you eat, avoid: Bending over. Exercise. Lying down. Chew sugar-free gum after meals. What foods should I eat? Eat a healthy diet. Try to include: Foods with high amounts of fiber. These include: Fruits and vegetables. Whole grains and beans. Low-fat dairy products. Lean meats, fish, and poultry. Egg whites. Foods that cause symptoms in someone else may not cause symptoms for you. Work with your provider to find foods that are safe for you. The items listed above may not be all the foods and drinks you can have. Talk with a dietitian to learn more. The items listed above may not be a complete list of foods and beverages you can eat and drink. Contact a dietitian for more information. What foods should I avoid? Limiting some of these foods may help with your symptoms. Each person is different.  Talk with a dietitian or your provider to help you find the exact foods to avoid. Some of the foods to avoid may include: Fruits Fruits with a lot of acid in them. These may include citrus fruits, such as oranges, grapefruit, pineapple, and lemons. Vegetables Deep-fried vegetables, such as [place] fries. Vegetables, sauces, or toppings made with added fat and vegetables with acid in them. These may include tomatoes and tomato products, chili peppers, onions, garlic, and horseradish. Grains Pastries or quick breads with added fat. Meats and other proteins High-fat meats, such as fatty beef or pork, hot dogs, ribs, ham, sausage, salami, and bacon. Fried meat or protein, such as fried fish and fried chicken. Egg yolks. Fats and oils Butter. Margarine. Shortening. Ghee. Drinks Coffee and other drinks with caffeine in them. Fizzy and sugary drinks, such as soda and energy drinks. Fruit juice made with acidic fruits, such as orange or grapefruit. Tomato juice. Sweets and desserts Chocolate and cocoa. Donuts. Seasonings and condiments Mint, such as peppermint and spearmint. Condiments, herbs, or seasonings that cause symptoms. These may include curry, hot sauce, or vinegar-based salad dressings. The items listed above may not be all the foods and drinks you should avoid. Talk with a dietitian to learn more. Questions to ask your health care provider Changes to your diet and everyday life are often the first steps taken to manage symptoms of GERD. If these changes don't help, talk with your provider about taking medicines. Where to find more information [employer] G[employer]intestinal [employer]:  aboutgerd.org This information is not intended to replace advice given to you by your health care provider. Make sure you discuss any questions you have with your health care provider. Document Revised: 04/23/2023 Document Reviewed: 11/07/2022 Elsevier Patient Education  2024 [employer].

## 2024-02-19 NOTE — Progress Notes (Signed)
 [facility]  [name], is a 68 y.o. female  RDW:[id number]  FMW:[id number]  DOB - [birth year]  Chief Complaint  Patient presents with   Follow-up       Subjective:   [name] is a 68 y.o. female here today for a follow up visit. Patient has No headache, No chest pain, No abdominal pain - No Nausea, No new weakness tingling or numbness, No Cough - shortness of breath HPI  No problems updated.  Comprehensive ROS Pertinent positive and negative noted in HPI   No Known Allergies  Past Medical History:  Diagnosis Date   Anemia    hx   Aneurysm (HCC)    small, on left side of brain    Anxiety    hx of   Arthritis    generalized   CAD (coronary artery disease)    Diabetes mellitus without complication (HCC)    diet control, pt denies- on medications   GERD (gastroesophageal reflux disease)    iwht certain foods/on meds/OTC PRN meds   Heart failure with mildly reduced ejection fraction (HFmrEF) (HCC)    Hyperlipidemia    on meds   Hypertension    on meds   TIA (transient ischemic attack)    pt unaware of this hx on 06/19/2017    Current Outpatient Medications on File Prior to Visit  Medication Sig Dispense Refill   Accu-Chek Softclix Lancets lancets Use to check blood sugar once daily. 100 each 2   aspirin  EC 81 MG tablet Take 1 tablet (81 mg total) by mouth daily.     Blood Glucose Monitoring Suppl (ACCU-CHEK GUIDE ME) w/Device KIT USE TO CHECK BLOOD SUGAR ONCE DAILY 1 kit 0   clopidogrel  (PLAVIX ) 75 MG tablet Take 1 tablet (75 mg total) by mouth daily. 90 tablet 3   dapagliflozin  propanediol (FARXIGA ) 10 MG TABS tablet Take 1 tablet (10 mg total) by mouth daily before breakfast. 90 tablet 1   dicyclomine  (BENTYL ) 10 MG capsule Take 1 capsule (10 mg total) by mouth 3 (three) times daily before meals. 90 capsule 11   DULoxetine  (CYMBALTA ) 30 MG capsule Take 1 capsule (30 mg total) by mouth daily. 180 capsule 1   Evolocumab  (REPATHA   SURECLICK) 140 MG/ML SOAJ Inject 140 mg into the skin every 14 (fourteen) days. 6 mL 3   ezetimibe  (ZETIA ) 10 MG tablet Take 1 tablet (10 mg total) by mouth daily. 90 tablet 3   fluconazole  (DIFLUCAN ) 150 MG tablet Take 1 tablet (150 mg total) by mouth daily. 1 tablet 1   furosemide  (LASIX ) 20 MG tablet Take 1 tablet (20 mg total) by mouth daily as needed. 90 tablet 3   glucose blood (ACCU-CHEK GUIDE TEST) test strip Use to check blood sugar once daily. 100 each 2   hydrOXYzine  (ATARAX ) 25 MG tablet Take 1 tablet (25 mg total) by mouth every 6 (six) hours. 12 tablet 0   JANUVIA  25 MG tablet Take 25 mg by mouth daily.     losartan -hydrochlorothiazide  (HYZAAR ) 100-25 MG tablet Take 1 tablet by mouth daily. 90 tablet 3   metoprolol  tartrate (LOPRESSOR ) 100 MG tablet TAKE 1 TABLET IN THE AM AND 1.5 TABLETS IN THE PM 225 tablet 3   ondansetron  (ZOFRAN -ODT) 4 MG disintegrating tablet Take 1 tablet (4 mg total) by mouth every 8 (eight) hours as needed. 12 tablet 0   potassium chloride  SA (KLOR-CON  M) 20 MEQ tablet Take 1 tablet (20 mEq total) by mouth daily as needed (when  taking furosemide  (lasix )). 90 tablet 3   pregabalin (LYRICA) 50 MG capsule Take 50 mg by mouth 3 (three) times daily.     rosuvastatin  (CRESTOR ) 40 MG tablet Take 1 tablet (40 mg total) by mouth daily. 90 tablet 3   No current facility-administered medications on file prior to visit.   Health Maintenance  Topic Date Due   Eye exam for diabetics  07/25/2018   COVID-19 Vaccine (3 - Pfizer risk series) 03/04/2020   Flu Shot  01/24/2024   Zoster (Shingles) Vaccine (2 of 2) 03/11/2024*   Yearly kidney health urinalysis for diabetes  05/01/2024   Complete foot exam   06/11/2024   Hemoglobin A1C  08/21/2024   DEXA scan (bone density measurement)  11/12/2024   Mammogram  01/30/2025   Medicare Annual Wellness Visit  01/30/2025   Yearly kidney function blood test for diabetes  02/15/2025   DTaP/Tdap/Td vaccine (2 - Td or Tdap)  04/26/2027   Colon Cancer Screening  02/13/2028   Pneumococcal Vaccine for age over 64  Completed   Hepatitis C Screening  Completed   HPV Vaccine  Aged Out   Meningitis B Vaccine  Aged Out  *Topic was postponed. The date shown is not the original due date.    Objective:   Vitals:   02/19/24 0907 02/19/24 0926  BP: (!) 165/104 (!) 147/91  Pulse: 98   Resp: 14   Temp: 97.9 F (36.6 C)   TempSrc: Oral   SpO2: 98%   Weight: 116 lb (52.6 kg)   Height: 5' 1 (1.549 m)    BP Readings from Last 3 Encounters:  02/19/24 (!) 147/91  02/16/24 (!) 180/105  02/06/24 135/89      Physical Exam Vitals reviewed.  Constitutional:      Appearance: Normal appearance.     Comments: Underweight   HENT:     Head: Normocephalic.     Right Ear: Tympanic membrane, ear canal and external ear normal.     Left Ear: Tympanic membrane, ear canal and external ear normal.     Nose: Nose normal.     Mouth/Throat:     Mouth: Mucous membranes are moist.  Eyes:     Extraocular Movements: Extraocular movements intact.     Pupils: Pupils are equal, round, and reactive to light.  Cardiovascular:     Rate and Rhythm: Normal rate.  Pulmonary:     Effort: Pulmonary effort is normal.     Breath sounds: Normal breath sounds.  Abdominal:     General: Bowel sounds are normal.     Palpations: Abdomen is soft.  Musculoskeletal:        General: Normal range of motion.     Cervical back: Normal range of motion.  Skin:    General: Skin is warm and dry.  Neurological:     Mental Status: She is alert and oriented to person, place, and time.  Psychiatric:        Mood and Affect: Mood normal.        Behavior: Behavior normal.        Thought Content: Thought content normal.       Assessment & Plan  Jillian was seen today for follow-up.  Diagnoses and all orders for this visit:  Diabetes mellitus type 2 in nonobese (HCC) -     POCT glycosylated hemoglobin (Hb A1C) -     Ambulatory referral to  Ophthalmology  Essential hypertension Sent message to cardiologist elevated    Gastroesophageal  reflux disease without esophagitis Discussed eating small frequent meal, reduction in acidic foods, fried foods ,spicy foods, alcohol caffeine and tobacco and certain medications. Avoid laying down after eating 17mins-1hour, elevated head of the bed.   Other orders -     pantoprazole  (PROTONIX ) 20 MG tablet; Take 1 tablet (20 mg total) by mouth daily.       Patient have been counseled extensively about nutrition and exercise. Other issues discussed during this visit include: low cholesterol diet, weight control and daily exercise, foot care, annual eye examinations at Ophthalmology, importance of adherence with medications and regular follow-up. We also discussed long term complications of uncontrolled diabetes and hypertension.   Return in about 3 months (around 05/21/2024).  The patient was given clear instructions to go to ER or return to medical center if symptoms don't improve, worsen or new problems develop. The patient verbalized understanding. The patient was told to call to get lab results if they haven't heard anything in the next week.   This note has been created with Education officer, environmental. Any transcriptional errors are unintentional.   [name], NP 02/19/2024, 10:06 AM

## 2024-02-25 ENCOUNTER — Encounter (HOSPITAL_BASED_OUTPATIENT_CLINIC_OR_DEPARTMENT_OTHER): Payer: Self-pay

## 2024-02-27 ENCOUNTER — Ambulatory Visit: Attending: Cardiology | Admitting: Cardiology

## 2024-02-27 ENCOUNTER — Ambulatory Visit: Admitting: Physician Assistant

## 2024-02-27 ENCOUNTER — Encounter: Payer: Self-pay | Admitting: Cardiology

## 2024-02-27 ENCOUNTER — Encounter: Payer: Self-pay | Admitting: Physician Assistant

## 2024-02-27 VITALS — BP 156/104 | HR 101 | Resp 16 | Ht 61.0 in | Wt 116.2 lb

## 2024-02-27 VITALS — BP 140/90 | HR 78 | Ht 60.0 in | Wt 115.4 lb

## 2024-02-27 DIAGNOSIS — K219 Gastro-esophageal reflux disease without esophagitis: Secondary | ICD-10-CM | POA: Diagnosis not present

## 2024-02-27 DIAGNOSIS — I2510 Atherosclerotic heart disease of native coronary artery without angina pectoris: Secondary | ICD-10-CM

## 2024-02-27 DIAGNOSIS — R140 Abdominal distension (gaseous): Secondary | ICD-10-CM

## 2024-02-27 DIAGNOSIS — I5022 Chronic systolic (congestive) heart failure: Secondary | ICD-10-CM | POA: Diagnosis not present

## 2024-02-27 DIAGNOSIS — R1013 Epigastric pain: Secondary | ICD-10-CM | POA: Diagnosis not present

## 2024-02-27 DIAGNOSIS — I10 Essential (primary) hypertension: Secondary | ICD-10-CM | POA: Diagnosis not present

## 2024-02-27 DIAGNOSIS — R079 Chest pain, unspecified: Secondary | ICD-10-CM | POA: Diagnosis not present

## 2024-02-27 DIAGNOSIS — Z951 Presence of aortocoronary bypass graft: Secondary | ICD-10-CM | POA: Diagnosis not present

## 2024-02-27 DIAGNOSIS — R112 Nausea with vomiting, unspecified: Secondary | ICD-10-CM

## 2024-02-27 DIAGNOSIS — Z860101 Personal history of adenomatous and serrated colon polyps: Secondary | ICD-10-CM | POA: Diagnosis not present

## 2024-02-27 DIAGNOSIS — E782 Mixed hyperlipidemia: Secondary | ICD-10-CM

## 2024-02-27 MED ORDER — PANTOPRAZOLE SODIUM 40 MG PO TBEC: 40.0000 mg | DELAYED_RELEASE_TABLET | Freq: Every day | ORAL | 3 refills | Status: AC

## 2024-02-27 NOTE — Progress Notes (Signed)
 Cardiology Office Note:  .   Date:  02/27/2024  ID:  [name], DOB 1956-06-25, MRN [id number] PCP:  Celestia Rosaline SQUIBB, NP  Former Cardiology Providers: DR. ALEENE PASSE [facility] Providers Cardiologist:  Madonna Large, DO, [facility] (established care 02/27/24)  Cardiology APP:  Rana Lum CROME, NP  Electrophysiologist:  None  Click to update primary MD,subspecialty MD or APP then REFRESH:1}    Chief Complaint  Patient presents with   Chest Pain   Hospitalization Follow-up    History of Present Illness: .   [name] is a 68 y.o. African-American female whose past medical history and cardiovascular risk factors includes: Coronary artery disease status post CABG, heart failure with mildly reduced LVEF, hypertension, hyperlipidemia, diabetes mellitus type 2.  She recently went to the ED on 02/16/2024 for chest pain evaluation.  High sensitive troponins were negative x 2.  Her hypertension was addressed, GI cocktail administered, blood pressures have improved, patient felt stable to be discharged home with outpatient follow-up.  Patient presents to the office accompanied by her friend Demetrius, patient provides verbal consent with having her present for today's encounter.  Patient states that she has been asked to hold off cardiac rehab until completed by cardiology.  She currently walks in the park with her friend 1.5 miles without exertional chest pain or shortness of breath.  The patient's lipids based on most recent blood work as well as blood pressures are not well-controlled.  Upon further questioning patient states that she has been off of her majority of her nose for the last 2 to 3 weeks due to financial constraints.  She is currently just taking aspirin , Plavix , and losartan .  Patient states that she has prescriptions at [facility] costing up $180 that she needs to pick up later this week.  Review of Systems: .   Review of Systems  Cardiovascular:  Negative  for chest pain, claudication, irregular heartbeat, leg swelling, near-syncope, orthopnea, palpitations, paroxysmal nocturnal dyspnea and syncope.  Respiratory:  Negative for shortness of breath.   Hematologic/Lymphatic: Negative for bleeding problem.    Studies Reviewed:   EKG: EKG Interpretation Date/Time:  Thursday February 27 2024 13:19:08 EDT Ventricular Rate:  93 PR Interval:  150 QRS Duration:  84 QT Interval:  344 QTC Calculation: 427 R Axis:   -17  Text Interpretation: Normal sinus rhythm Consider Inferior infarct , age undetermined Cannot rule out Anterior infarct , age undetermined When compared with ECG of 16-Feb-2024 04:55, No significant change since last tracing Confirmed by Large Madonna [phone]) on 02/27/2024 1:33:23 PM  Echocardiogram: 06/24/2023:  1. Left ventricular ejection fraction, by estimation, is 40 to 45%. The  left ventricle has mildly decreased function. The left ventricle  demonstrates regional wall motion abnormalities (see scoring  diagram/findings for description). There is mild  concentric left ventricular hypertrophy. Left ventricular diastolic  parameters are consistent with Grade I diastolic dysfunction (impaired  relaxation). Elevated left ventricular end-diastolic pressure.   2. Right ventricular systolic function is normal. The right ventricular  size is normal.   3. Left atrial size was severely dilated.   4. The mitral valve is normal in structure. Trivial mitral valve  regurgitation. No evidence of mitral stenosis.   5. The aortic valve is normal in structure. Aortic valve regurgitation is  not visualized. No aortic stenosis is present.   6. The inferior vena cava is normal in size with greater than 50%  respiratory variability, suggesting right atrial pressure of 3 mmHg.  Follow-up echo ordered for 03/05/2024.  Left heart catheterization: 06/27/2023 Severe multivessel CAD: Dominance: Right Ostial and proximal LAD 80% stenosis with mild  diffuse disease elsewhere Ostial and proximal RI 80% stenosis Proximal LCx 70% stenosis with mid to distal 95% stenosis after small OM 2 Mid RCA 80% Moderate reduced EF by echo 40 to 45% with anterior hypokinesis.   Normal LVEDP  RADIOLOGY: None  Risk Assessment/Calculations:   The 10-year ASCVD risk score (Arnett DK, et al., 2019) is: 35.3%   Values used to calculate the score:     Age: 76 years     Clincally relevant sex: Female     Is Non-Hispanic African American: Yes     Diabetic: Yes     Tobacco smoker: No     Systolic Blood Pressure: 140 mmHg     Is BP treated: Yes     HDL Cholesterol: 76 mg/dL     Total Cholesterol: 252 mg/dL  Labs:       Latest Ref Rng & Units 02/16/2024    3:53 AM 12/25/2023   10:29 AM 12/13/2023    2:19 PM  CBC  WBC 4.0 - 10.5 K/uL 9.3  7.7  8.8   Hemoglobin 12.0 - 15.0 g/dL 88.3  88.0  87.4   Hematocrit 36.0 - 46.0 % 36.9  36.3  40.3   Platelets 150 - 400 K/uL 344  292  273        Latest Ref Rng & Units 02/16/2024    3:53 AM 01/24/2024   11:16 AM 12/25/2023   10:29 AM  BMP  Glucose 70 - 99 mg/dL 856  875  842   BUN 8 - 23 mg/dL 16  23  19    Creatinine 0.44 - 1.00 mg/dL 9.08  9.15  9.16   BUN/Creat Ratio 12 - 28  27    Sodium 135 - 145 mmol/L 140  139  141   Potassium 3.5 - 5.1 mmol/L 4.4  4.5  3.4   Chloride 98 - 111 mmol/L 106  100  103   CO2 22 - 32 mmol/L 25  20  26    Calcium  8.9 - 10.3 mg/dL 9.5  89.6  9.8       Latest Ref Rng & Units 02/16/2024    3:53 AM 01/24/2024   11:16 AM 12/25/2023   10:29 AM  CMP  Glucose 70 - 99 mg/dL 856  875  842   BUN 8 - 23 mg/dL 16  23  19    Creatinine 0.44 - 1.00 mg/dL 9.08  9.15  9.16   Sodium 135 - 145 mmol/L 140  139  141   Potassium 3.5 - 5.1 mmol/L 4.4  4.5  3.4   Chloride 98 - 111 mmol/L 106  100  103   CO2 22 - 32 mmol/L 25  20  26    Calcium  8.9 - 10.3 mg/dL 9.5  89.6  9.8   Total Protein 6.0 - 8.5 g/dL  7.7  7.3   Total Bilirubin 0.0 - 1.2 mg/dL  0.5  0.9   Alkaline Phos 44 - 121 IU/L  117   77   AST 0 - 40 IU/L  23  33   ALT 0 - 32 IU/L  20  33     Lab Results  Component Value Date   CHOL 252 (H) 01/24/2024   HDL 76 01/24/2024   LDLCALC 146 (H) 01/24/2024   TRIG 168 (H) 01/24/2024   CHOLHDL 3.3 01/24/2024  Recent Labs    06/25/23 1202  LIPOA <8.4   No components found for: NTPROBNP Recent Labs    11/01/23 1715  PROBNP 869*   Recent Labs    07/25/23 0749  TSH 2.280    Physical Exam:    Today's Vitals   02/27/24 1315  BP: (!) 156/104  Pulse: (!) 101  Resp: 16  SpO2: 97%  Weight: 116 lb 3.2 oz (52.7 kg)  Height: 5' 1 (1.549 m)   Body mass index is 21.96 kg/m. Wt Readings from Last 3 Encounters:  02/27/24 115 lb 6 oz (52.3 kg)  02/27/24 116 lb 3.2 oz (52.7 kg)  02/19/24 116 lb (52.6 kg)    Physical Exam  Constitutional: No distress.  hemodynamically stable  Neck: No JVD present.  Cardiovascular: Normal rate, regular rhythm, S1 normal and S2 normal. Exam reveals no gallop, no S3 and no S4.  No murmur heard. Pulmonary/Chest: Effort normal and breath sounds normal. No stridor. She has no wheezes. She has no rales.  Musculoskeletal:        General: No edema.     Cervical back: Neck supple.  Skin: Skin is warm.     Impression & Recommendation(s):  Impression:   ICD-10-CM   1. Coronary artery disease involving native coronary artery of native heart without angina pectoris  I25.10 EKG 12-Lead    2. Hx of CABG  Z95.1     3. Chronic heart failure with mildly reduced ejection fraction (HFmrEF, 41-49%) (HCC)  I50.22     4. Essential hypertension  I10     5. Mixed hyperlipidemia  E78.2        Recommendation(s):  Coronary artery disease involving native coronary artery of native heart without angina pectoris Hx of CABG Currently denies anginal chest pain or heart failure symptoms. Recent ER documentation reviewed,hs troponins negative x 2 EKG today is nonischemic. Echocardiogram scheduled for 03/05/2024 Restart cardiac rehabilitation  next week once medications resumed and blood pressure controlled. Resume medications including Farxiga , Crestor , Repatha , and Lopressor . Evaluate medication costs and explore affordable alternatives. Follow up with APP in six months and with doctor in one year.  Chronic heart failure with mildly reduced ejection fraction (HFmrEF, 41-49%) (HCC) Not in congestive heart failure. Hopeful that LVEF has recovered. Will hold off on uptitration of GDMT as she is currently not on her baseline medications. Patient is asked to call our office if SBP is still greater than 130 mmHg after restarting medication so that GDMT can be further uptitrated. Has an appointment with APP in the next 6 months as well  Essential hypertension Office blood pressures are not well-controlled. Currently not on her baseline blood pressure medication/GDMT as it has been cost prohibitive I suspect that the most expensive medications are likely Repatha  and Farxiga . Patient is asked to call us  back to explore affordable alternatives if available. Reemphasized importance of low-salt diet.  Mixed hyperlipidemia Restart Repatha  and Zetia . Recommend rechecking fasting lipids at the next visit  Orders Placed:  Orders Placed This Encounter  Procedures   EKG 12-Lead     Final Medication List:   No orders of the defined types were placed in this encounter.   There are no discontinued medications.   Current Outpatient Medications:    Accu-Chek Softclix Lancets lancets, Use to check blood sugar once daily., Disp: 100 each, Rfl: 2   aspirin  EC 81 MG tablet, Take 1 tablet (81 mg total) by mouth daily., Disp: , Rfl:    Blood Glucose  Monitoring Suppl (ACCU-CHEK GUIDE ME) w/Device KIT, USE TO CHECK BLOOD SUGAR ONCE DAILY, Disp: 1 kit, Rfl: 0   clopidogrel  (PLAVIX ) 75 MG tablet, Take 1 tablet (75 mg total) by mouth daily., Disp: 90 tablet, Rfl: 3   dapagliflozin  propanediol (FARXIGA ) 10 MG TABS tablet, Take 1 tablet (10 mg  total) by mouth daily before breakfast., Disp: 90 tablet, Rfl: 1   dicyclomine  (BENTYL ) 10 MG capsule, Take 1 capsule (10 mg total) by mouth 3 (three) times daily before meals., Disp: 90 capsule, Rfl: 11   DULoxetine  (CYMBALTA ) 30 MG capsule, Take 1 capsule (30 mg total) by mouth daily., Disp: 180 capsule, Rfl: 1   Evolocumab  (REPATHA  SURECLICK) 140 MG/ML SOAJ, Inject 140 mg into the skin every 14 (fourteen) days., Disp: 6 mL, Rfl: 3   ezetimibe  (ZETIA ) 10 MG tablet, Take 1 tablet (10 mg total) by mouth daily., Disp: 90 tablet, Rfl: 3   fluconazole  (DIFLUCAN ) 150 MG tablet, Take 1 tablet (150 mg total) by mouth daily., Disp: 1 tablet, Rfl: 1   furosemide  (LASIX ) 20 MG tablet, Take 1 tablet (20 mg total) by mouth daily as needed., Disp: 90 tablet, Rfl: 3   glucose blood (ACCU-CHEK GUIDE TEST) test strip, Use to check blood sugar once daily., Disp: 100 each, Rfl: 2   hydrOXYzine  (ATARAX ) 25 MG tablet, Take 1 tablet (25 mg total) by mouth every 6 (six) hours., Disp: 12 tablet, Rfl: 0   JANUVIA  25 MG tablet, Take 25 mg by mouth daily., Disp: , Rfl:    losartan -hydrochlorothiazide  (HYZAAR ) 100-25 MG tablet, Take 1 tablet by mouth daily., Disp: 90 tablet, Rfl: 3   metoprolol  tartrate (LOPRESSOR ) 100 MG tablet, TAKE 1 TABLET IN THE AM AND 1.5 TABLETS IN THE PM, Disp: 225 tablet, Rfl: 3   ondansetron  (ZOFRAN -ODT) 4 MG disintegrating tablet, Take 1 tablet (4 mg total) by mouth every 8 (eight) hours as needed., Disp: 12 tablet, Rfl: 0   potassium chloride  SA (KLOR-CON  M) 20 MEQ tablet, Take 1 tablet (20 mEq total) by mouth daily as needed (when taking furosemide  (lasix ))., Disp: 90 tablet, Rfl: 3   pregabalin (LYRICA) 50 MG capsule, Take 50 mg by mouth 3 (three) times daily., Disp: , Rfl:    rosuvastatin  (CRESTOR ) 40 MG tablet, Take 1 tablet (40 mg total) by mouth daily., Disp: 90 tablet, Rfl: 3   amLODipine  (NORVASC ) 5 MG tablet, Take 5 mg by mouth daily., Disp: , Rfl:    pantoprazole  (PROTONIX ) 40 MG  tablet, Take 1 tablet (40 mg total) by mouth daily., Disp: 90 tablet, Rfl: 3  Consent:   NA  Disposition:   56-month follow-up with APP. 1 year follow-up with myself, sooner if needed  Her questions and concerns were addressed to her satisfaction. She voices understanding of the recommendations provided during this encounter.    Signed, Madonna Michele HAS, [facility]  A Division of [facility] [address], [place], [place] [address]  02/27/2024 7:42 PM

## 2024-02-27 NOTE — Telephone Encounter (Signed)
 Message received from [name], LANC stating that the city of [place] has rental assistance for September, so they  opened a file and are providing assistance to her.  I called the patient to see how she is doing and inquire if she is in need of additional resources and had to leave a message requesting a call back.

## 2024-02-27 NOTE — Patient Instructions (Signed)
 Medication Instructions:  Your physician recommends that you continue on your current medications as directed. Please refer to the Current Medication list given to you today.  *If you need a refill on your cardiac medications before your next appointment, please call your pharmacy*   Follow-Up: At [facility], you and your health needs are our priority.  As part of our continuing mission to provide you with exceptional heart care, our providers are all part of one team.  This team includes your primary Cardiologist (physician) and Advanced Practice Providers or APPs (Physician Assistants and Nurse Practitioners) who all work together to provide you with the care you need, when you need it.  Your next appointment:   6 month(s)  Provider:   Lum Louis, NP         Then, [place], DO will plan to see you again in 12 month(s).     We recommend signing up for the patient portal called MyChart.  Sign up information is provided on this After Visit Summary.  MyChart is used to connect with patients for Virtual Visits (Telemedicine).  Patients are able to view lab/test results, encounter notes, upcoming appointments, etc.  Non-urgent messages can be sent to your provider as well.   To learn more about what you can do with MyChart, go to [email or web address].

## 2024-02-27 NOTE — Progress Notes (Addendum)
 02/27/2024 [name] [id number] 1956/02/03  Referring provider: Celestia Rosaline SQUIBB, NP Primary GI doctor: Dr. San (Dr.Stark)  ASSESSMENT AND PLAN:  GERD with intermittent nausea and vomiting 10/27/2020 EGD for nausea and vomiting normal esophagus, erythematous mucosa in the stomach, small hiatal hernia, normal duodenum.  Non-H. pylori mild chronic gastritis otherwise unremarkable 12/03/2022 CTAP W no acute pathology mild hepatic steatosis diverticulosis without inflammation GES discussed but never done No melena, dysphagia, alarm symptoms has been off pantoprazole  likely gastritis possible musculoskeletal component -Restart pantoprazole  40 mg daily, prescription sent to [facility] with GoodRx card to make it cheaper, given 5-day Voquenza 10 mg sample to take in the meantime -Lifestyle changes discussed, avoid NSAIDS, ETOH, hand out given to the patient -GES, gastroparesis diet given - Patient is 8 months out from bypass, no alarm symptoms no plans to repeat EGD at this time.  Chest pain 02/16/24 went to ER with chest pain, normal troponin, waking up at night, worse with laying on left side, worse with touching as well EKG normal, troponin negative Possible MSK from CABG versus GERD -Restart pantoprazole  40 mg daily, prescription sent to [facility] with GoodRx card to make it cheaper, given 5-day Voquenza10 mg sample to take in the meantime -Salonpas patches  Gas Given FODMAP information/diet Given restora for 1 month consider SIBO treatment if not better  Personal history of adenomatous polyps 02/13/2023 colonoscopy Dr. Aneita 2-day prep diverticulosis right colon moderate diverticulosis left colon 6 mm polyp mid rectum recall 5 years  Diverticulosis Will call if any symptoms. Add on fiber supplement, avoid NSAIDS, information given  Fatty liver seen on CT 12/03/2022 Normal LFTs, remote isolated elevation of alkaline phosphatase following CABG January  2025    Latest Ref Rng & Units 01/24/2024   11:16 AM 12/25/2023   10:29 AM 07/25/2023    7:49 AM  Hepatic Function  Total Protein 6.0 - 8.5 g/dL 7.7  7.3  6.9   Albumin  3.9 - 4.9 g/dL 4.6  3.7  4.3   AST 0 - 40 IU/L 23  33  15   ALT 0 - 32 IU/L 20  33  14   Alk Phosphatase 44 - 121 IU/L 117  77  168   Total Bilirubin 0.0 - 1.2 mg/dL 0.5  0.9  0.2    Platelets 344  - need LFTs and CBC monitored every 6 months, - evaluation with imaging every 2-3 years.  - Encouraged diet/exercise for modest 10% body weight loss as treatment for hepatic steatosis -Continue to work on risk factor modification including diet exercise and control of risk factors including blood sugars. 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 She is on plavix  daily  HFrHF 07/03/2023 intraoperative TEE ejection fraction 45-50% no aortic stenosis  Patient Care Team: Celestia Rosaline SQUIBB, NP as PCP - General (Internal Medicine) Michele Richardson, DO as PCP - Cardiology (Cardiology) Danis, Victory LITTIE MOULD, MD as Consulting Physician (Gastroenterology) Waylan Cain, MD as Consulting Physician (Ophthalmology) Thornell Carliss DASEN, MD as Referring Physician (Orthopedic Surgery) Dubicki, Corean DELENA RIGGERS (Physician Assistant) Camella Fallow, MD as Consulting Physician (Orthopedic Surgery) Joya Stabs, DPM as Consulting Physician (Podiatry) Celestia Rosaline SQUIBB, NP as Nurse Practitioner (Internal Medicine) Celestia Rosaline SQUIBB, NP as Nurse Practitioner (Internal Medicine) Gladis Sin, RN (Inactive) as [facility] Management Rana Lum LITTIE, NP as Nurse Practitioner (Cardiology)  HISTORY OF  PRESENT ILLNESS: 68 y.o. female with a past medical history listed below presents for evaluation of upper AB pain and gas.   Last seen in the office 02/04/2023 by Dr. Aneita for intermittent nausea and vomiting, personal  history of adenomatous polyps GERD and diverticulosis.  Discussed the use of AI scribe software for clinical note transcription with the patient, who gave verbal consent to proceed.  History of Present Illness   [name] is a 68 year old female with coronary artery disease and hiatal hernia who presents with gastrointestinal symptoms and medication management issues.  She experiences significant gastrointestinal symptoms, including nausea, vomiting, and heartburn. An endoscopy in 2022 showed mild gastritis without H. pylori infection or abnormal cells. A CT scan in June 2024 revealed fatty liver but no acute findings. A gastric emptying study was discussed but not performed.  She underwent a quadruple bypass surgery on July 03, 2023, after experiencing severe chest pain and elevated blood pressure. Post-surgery, she reports a 'tingy sensation' in her chest when stressed and had a recent ER visit for chest discomfort, which was negative for acute issues. She has been off her medications due to financial constraints but plans to resume them soon. She has been taking clopidogrel  and aspirin  regularly.  She describes a severe, sharp pain in the upper abdomen occurring at night, which did not radiate but was intense enough to require walking around for relief. This episode was accompanied by increased gas production, a new symptom for her, persisting for several weeks. No dark stools, blood in stools, or recent heartburn.  Lying on her left side at night exacerbates a 'stingy' pain between her surgical scar and breast, which she tries to alleviate by rubbing. She avoids this position to prevent discomfort. No hoarseness, difficulty swallowing, or food getting caught, although she previously experienced difficulty swallowing large pills.  She eats smaller, more frequent meals due to feelings of fullness when eating larger meals. No constipation or diarrhea but reports significant gas without  associated burping. She has no history of alcohol or tobacco use.        She  reports that she has quit smoking. Her smoking use included cigarettes. She has never used smokeless tobacco. She reports that she does not currently use alcohol. She reports that she does not currently use drugs after having used the following drugs: Marijuana.  RELEVANT GI HISTORY, IMAGING AND LABS: Results   RADIOLOGY CT scan: Fatty liver, no acute findings (11/2022)  DIAGNOSTIC Endoscopy: Mild gastritis, no H. pylori, no abnormal cells (2022) ECG: Normal      CBC    Component Value Date/Time   WBC 9.3 02/16/2024 0353   RBC 4.19 02/16/2024 0353   HGB 11.6 (L) 02/16/2024 0353   HGB 13.3 11/01/2023 1715   HCT 36.9 02/16/2024 0353   HCT 40.3 11/01/2023 1715   PLT 344 02/16/2024 0353   PLT 406 11/01/2023 1715   MCV 88.1 02/16/2024 0353   MCV 83 11/01/2023 1715   MCH 27.7 02/16/2024 0353   MCHC 31.4 02/16/2024 0353   RDW 14.9 02/16/2024 0353   RDW 13.1 11/01/2023 1715   LYMPHSABS 2.4 12/25/2023 1029   LYMPHSABS 2.2 05/03/2023 0848   MONOABS 0.5 12/25/2023 1029   EOSABS 0.1 12/25/2023 1029   EOSABS 0.1 05/03/2023 0848   BASOSABS 0.1 12/25/2023 1029   BASOSABS 0.1 05/03/2023 0848   Recent Labs    07/06/23 0258 07/07/23 0312 07/25/23 0749 08/19/23 0942 10/05/23 2228 10/08/23 1617 11/01/23 1715 12/13/23  1419 12/25/23 1029 02/16/24 0353  HGB 9.4* 9.5* 11.6 11.6* 13.4 13.9 13.3 12.5 11.9* 11.6*    CMP     Component Value Date/Time   NA 140 02/16/2024 0353   NA 139 01/24/2024 1116   K 4.4 02/16/2024 0353   CL 106 02/16/2024 0353   CO2 25 02/16/2024 0353   GLUCOSE 143 (H) 02/16/2024 0353   BUN 16 02/16/2024 0353   BUN 23 01/24/2024 1116   CREATININE 0.91 02/16/2024 0353   CREATININE 0.72 02/24/2013 1721   CALCIUM  9.5 02/16/2024 0353   PROT 7.7 01/24/2024 1116   ALBUMIN  4.6 01/24/2024 1116   AST 23 01/24/2024 1116   ALT 20 01/24/2024 1116   ALKPHOS 117 01/24/2024 1116    BILITOT 0.5 01/24/2024 1116   GFRNONAA >60 02/16/2024 0353   GFRAA 109 05/27/2020 1018      Latest Ref Rng & Units 01/24/2024   11:16 AM 12/25/2023   10:29 AM 07/25/2023    7:49 AM  Hepatic Function  Total Protein 6.0 - 8.5 g/dL 7.7  7.3  6.9   Albumin  3.9 - 4.9 g/dL 4.6  3.7  4.3   AST 0 - 40 IU/L 23  33  15   ALT 0 - 32 IU/L 20  33  14   Alk Phosphatase 44 - 121 IU/L 117  77  168   Total Bilirubin 0.0 - 1.2 mg/dL 0.5  0.9  0.2       Current Medications:   Current Outpatient Medications (Endocrine & Metabolic):    dapagliflozin  propanediol (FARXIGA ) 10 MG TABS tablet, Take 1 tablet (10 mg total) by mouth daily before breakfast.   JANUVIA  25 MG tablet, Take 25 mg by mouth daily.  Current Outpatient Medications (Cardiovascular):    amLODipine  (NORVASC ) 5 MG tablet, Take 5 mg by mouth daily.   Evolocumab  (REPATHA  SURECLICK) 140 MG/ML SOAJ, Inject 140 mg into the skin every 14 (fourteen) days.   ezetimibe  (ZETIA ) 10 MG tablet, Take 1 tablet (10 mg total) by mouth daily.   furosemide  (LASIX ) 20 MG tablet, Take 1 tablet (20 mg total) by mouth daily as needed.   losartan -hydrochlorothiazide  (HYZAAR ) 100-25 MG tablet, Take 1 tablet by mouth daily.   metoprolol  tartrate (LOPRESSOR ) 100 MG tablet, TAKE 1 TABLET IN THE AM AND 1.5 TABLETS IN THE PM   rosuvastatin  (CRESTOR ) 40 MG tablet, Take 1 tablet (40 mg total) by mouth daily.   Current Outpatient Medications (Analgesics):    aspirin  EC 81 MG tablet, Take 1 tablet (81 mg total) by mouth daily.  Current Outpatient Medications (Hematological):    clopidogrel  (PLAVIX ) 75 MG tablet, Take 1 tablet (75 mg total) by mouth daily.  Current Outpatient Medications (Other):    Accu-Chek Softclix Lancets lancets, Use to check blood sugar once daily.   Blood Glucose Monitoring Suppl (ACCU-CHEK GUIDE ME) w/Device KIT, USE TO CHECK BLOOD SUGAR ONCE DAILY   dicyclomine  (BENTYL ) 10 MG capsule, Take 1 capsule (10 mg total) by mouth 3 (three) times daily  before meals.   DULoxetine  (CYMBALTA ) 30 MG capsule, Take 1 capsule (30 mg total) by mouth daily.   fluconazole  (DIFLUCAN ) 150 MG tablet, Take 1 tablet (150 mg total) by mouth daily.   glucose blood (ACCU-CHEK GUIDE TEST) test strip, Use to check blood sugar once daily.   hydrOXYzine  (ATARAX ) 25 MG tablet, Take 1 tablet (25 mg total) by mouth every 6 (six) hours.   ondansetron  (ZOFRAN -ODT) 4 MG disintegrating tablet, Take 1 tablet (4 mg  total) by mouth every 8 (eight) hours as needed.   pantoprazole  (PROTONIX ) 40 MG tablet, Take 1 tablet (40 mg total) by mouth daily.   potassium chloride  SA (KLOR-CON  M) 20 MEQ tablet, Take 1 tablet (20 mEq total) by mouth daily as needed (when taking furosemide  (lasix )).   pregabalin (LYRICA) 50 MG capsule, Take 50 mg by mouth 3 (three) times daily.  Medical History:  Past Medical History:  Diagnosis Date   Anemia    hx   Aneurysm (HCC)    small, on left side of brain    Anxiety    hx of   Arthritis    generalized   CAD (coronary artery disease)    CHF (congestive heart failure) (HCC) 2025   Diabetes mellitus without complication (HCC)    diet control, pt denies- on medications   GERD (gastroesophageal reflux disease)    iwht certain foods/on meds/OTC PRN meds   Heart failure with mildly reduced ejection fraction (HFmrEF) (HCC)    Hyperlipidemia    on meds   Hypertension    on meds   TIA (transient ischemic attack)    pt unaware of this hx on 06/19/2017   Allergies: No Known Allergies   Surgical History:  She  has a past surgical history that includes Cesarean section (1986); ir generic historical (09/21/2014); ir generic historical (09/21/2014); ir generic historical (09/21/2014); ir generic historical (09/21/2014); Laparoscopic cholecystectomy; Back surgery; Anterior cervical decomp/discectomy fusion; Tubal ligation; Foot surgery (Left, 11/2019); Carpal tunnel release (Left, 2024); Tubal ligation; LEFT HEART CATH AND CORONARY ANGIOGRAPHY (N/A,  06/27/2023); Coronary artery bypass graft (N/A, 07/03/2023); and TEE without cardioversion (N/A, 07/03/2023). Family History:  Her family history includes Arthritis in her mother; Dementia in her mother; Diabetes in her brother, mother, sister, and sister; Heart attack in her father; Heart disease in her brother, father, and sister; Hypertension in her brother, brother, brother, brother, mother, sister, and sister.  REVIEW OF SYSTEMS  : All other systems reviewed and negative except where noted in the History of Present Illness.  PHYSICAL EXAM: BP (!) 140/90   Pulse 78   Ht 5' (1.524 m)   Wt 115 lb 6 oz (52.3 kg)   BMI 22.53 kg/m  Physical Exam   GENERAL APPEARANCE: Well nourished, in no apparent distress. HEENT: No cervical lymphadenopathy, unremarkable thyroid , sclerae anicteric, conjunctiva pink. RESPIRATORY: Respiratory effort normal, breath sounds clear to auscultation bilaterally without rales, rhonchi, or wheezing. CARDIO: Regular rate and rhythm with no murmurs, rubs, or gallops, peripheral pulses intact. ABDOMEN: Soft, non-distended, active bowel sounds in all four quadrants, tenderness in the upper abdomen, no rebound, no mass appreciated. RECTAL: Declines. MUSCULOSKELETAL: Full range of motion, normal gait, without edema. SKIN: Dry, intact without rashes or lesions. No jaundice. NEURO: Alert, oriented, no focal deficits. PSYCH: Cooperative, normal mood and affect.      Alan JONELLE Coombs, PA-C 3:25 PM

## 2024-02-27 NOTE — Patient Instructions (Addendum)
 _______________________________________________________  If your blood pressure at your visit was 140/90 or greater, please contact your primary care physician to follow up on this.  _______________________________________________________  If you are age 69 or older, your body mass index should be between 23-30. Your Body mass index is 22.53 kg/m. If this is out of the aforementioned range listed, please consider follow up with your Primary Care Provider.  If you are age 58 or younger, your body mass index should be between 19-25. Your Body mass index is 22.53 kg/m. If this is out of the aformentioned range listed, please consider follow up with your Primary Care Provider.   ________________________________________________________  The [facility] providers would like to encourage you to use MYCHART to communicate with providers for non-urgent requests or questions.  Due to long hold times on the telephone, sending your provider a message by [facility] may be a faster and more efficient way to get a response.  Please allow 48 business hours for a response.  Please remember that this is for non-urgent requests.  _______________________________________________________  [facility] is using a team-based approach to care.  Your team is made up of your doctor and two to three APPS. Our APPS (Nurse Practitioners and Physician Assistants) work with your physician to ensure care continuity for you. They are fully qualified to address your health concerns and develop a treatment plan. They communicate directly with your gastroenterologist to care for you. Seeing the Advanced Practice Practitioners on your physician's team can help you by facilitating care more promptly, often allowing for earlier appointments, access to diagnostic testing, procedures, and other specialty referrals.   Your provider has requested that you go to the basement level for lab work before leaving today. Press B on the  elevator. The lab is located at the first door on the left as you exit the elevator.  Please take your proton pump inhibitor medication, pantoprazole  40 mg daily  Please take this medication 30 minutes to 1 hour before meals- this makes it more effective.  Avoid spicy and acidic foods Avoid fatty foods Limit your intake of coffee, tea, alcohol, and carbonated drinks Work to maintain a healthy weight Keep the head of the bed elevated at least 3 inches with blocks or a wedge pillow if you are having any nighttime symptoms Stay upright for 2 hours after eating Avoid meals and snacks three to four hours before bedtime  You have been scheduled for a gastric emptying scan at [facility] (Entrance A) on 03-04-24 at 10am. Please arrive at least 30 minutes prior to your appointment for registration. Please make certain not to have anything to eat or drink after midnight the night before your test. Hold all stomach medications (ex: Zofran , phenergan , Reglan ) 24 hours prior to your test. If you need to reschedule your appointment, please contact radiology scheduling at [phone]. _____________________________________________________________________ A gastric-emptying study measures how long it takes for food to move through your stomach. There are several ways to measure stomach emptying. In the most common test, you eat food that contains a small amount of radioactive material. A scanner that detects the movement of the radioactive material is placed over your abdomen to monitor the rate at which food leaves your stomach. This test normally takes about 4 hours to complete. _____________________________________________________________________  Due to recent changes in healthcare laws, you may see the results of your imaging and laboratory studies on MyChart before your provider has had a chance to review them.  We understand that in  some cases there may be results that are confusing or concerning to you. Not  all laboratory results come back in the same time frame and the provider may be waiting for multiple results in order to interpret others.  Please give us  48 hours in order for your provider to thoroughly review all the results before contacting the office for clarification of your results.   CHECK ON REPATHA .COM ABOUT THEIR COPAY CARD  Gastroparesis Gastroparesis is a condition in which food takes longer than normal to empty from the stomach.  This condition is also known as delayed gastric emptying. It is usually a long-term (chronic) condition.  What are the signs or symptoms? Symptoms of this condition include: Feeling full after eating very little or a loss of appetite. Nausea, vomiting, or heartburn. Bloating of your abdomen. Inconsistent blood sugar (glucose) levels on blood tests. Unexplained weight loss. Acid from the stomach coming up into the esophagus (gastroesophageal reflux). Sudden tightening (spasm) of the stomach, which can be painful. Symptoms may come and go. Some people may not notice any symptoms.  What increases the risk? You are more likely to develop this condition if: You have certain disorders or diseases. These may include: An endocrine disorder. An eating disorder. Amyloidosis. Scleroderma. Parkinson's disease. Multiple sclerosis. Cancer or infection of the stomach or the vagus nerve. You have had surgery on your stomach or vagus nerve. You take certain medicines. You are female.  Things you can do: Please do small frequent meals like 4-6 meals a day.  Eat and drink liquids at separate times.  Avoid high fiber foods, cook your vegetables, avoid high fat food.  Suggest spreading protein throughout the day (greek yogurt, glucerna, soft meat, milk, eggs) Choose soft foods that you can mash with a fork When you are more symptomatic, change to pureed foods foods and liquids.  Consider reading Living well with Gastroparesis by [facility] Check  out this link to a diet online [email or web address].GroupJournal.fr    Abdominal bloating and discomfort may be due to intestinal sensitivity or symptoms of irritable bowel syndrome. To relieve symptoms, avoid:  Broccoli  Baked beans  Cabbage  Carbonated drinks  Cauliflower  Chewing gum  Hard candy Abdominal distention resulting from weak abdominal muscles:  Is better in the morning  Gets worse as the day progresses  Is relieved by lying down Flatulence is gas created through bacterial action in the bowel and passed rectally. Keep in mind that:  10-18 passages per day are normal  Primary gases are harmless and odorless  Noticeable smells are trace gases related to food intake Foods to AVOID that are likely to form gas include:  Milk, dairy products, and medications that contain lactose--If your body doesn't produce the enzyme (lactase) to break it down.  Certain vegetables--baked beans, cauliflower, broccoli, cabbage  Certain starches--wheat, oats, corn, potatoes. Rice is a good substitute. Identify offending foods. Reduce or eliminate these gas-forming foods from your diet. Can look at the FODMAP diet.     FODMAP stands for fermentable oligo-, di-, mono-saccharides and polyols (1). These are the scientific terms used to classify groups of carbs that are difficult for our body to digest and that are notorious for triggering digestive symptoms like bloating, gas, loose stools and stomach pain.   You can try low FODMAP diet  - start with eliminating just one column at a time that you feel may be a trigger for you. - the table at the very bottom contains foods that are low  in FODMAPs   Sometimes trying to eliminate the FODMAP's from your diet is difficult or tricky, if you are stuggling with trying to do the elimination diet you can try an enzyme.  There is a food enzymes that you sprinkle in or on your  food that helps break down the FODMAP. You can read more about the enzyme by going to this site: https://fodzyme.com/   Thank you for entrusting me with your care and choosing [facility].  Alan Coombs, PA-C

## 2024-02-28 ENCOUNTER — Inpatient Hospital Stay (HOSPITAL_COMMUNITY): Admission: RE | Admit: 2024-02-28 | Source: Ambulatory Visit

## 2024-02-28 ENCOUNTER — Other Ambulatory Visit (INDEPENDENT_AMBULATORY_CARE_PROVIDER_SITE_OTHER)

## 2024-02-28 ENCOUNTER — Telehealth (HOSPITAL_COMMUNITY): Payer: Self-pay | Admitting: *Deleted

## 2024-02-28 DIAGNOSIS — R112 Nausea with vomiting, unspecified: Secondary | ICD-10-CM | POA: Diagnosis not present

## 2024-02-28 LAB — COMPREHENSIVE METABOLIC PANEL WITH GFR
ALT: 8 U/L (ref 0–35)
AST: 13 U/L (ref 0–37)
Albumin: 4.3 g/dL (ref 3.5–5.2)
Alkaline Phosphatase: 91 U/L (ref 39–117)
BUN: 17 mg/dL (ref 6–23)
CO2: 27 meq/L (ref 19–32)
Calcium: 9.5 mg/dL (ref 8.4–10.5)
Chloride: 102 meq/L (ref 96–112)
Creatinine, Ser: 0.71 mg/dL (ref 0.40–1.20)
GFR: 86.74 mL/min (ref >60.00)
Glucose, Bld: 139 mg/dL — ABNORMAL HIGH (ref 70–99)
Potassium: 3.6 meq/L (ref 3.5–5.1)
Sodium: 142 meq/L (ref 135–145)
Total Bilirubin: 0.3 mg/dL (ref 0.2–1.2)
Total Protein: 7.7 g/dL (ref 6.0–8.3)

## 2024-02-28 LAB — CBC WITH DIFFERENTIAL/PLATELET
Basophils Absolute: 0.1 K/uL (ref 0.0–0.1)
Basophils Relative: 0.9 % (ref 0.0–3.0)
Eosinophils Absolute: 0.2 K/uL (ref 0.0–0.7)
Eosinophils Relative: 1.6 % (ref 0.0–5.0)
HCT: 37.5 % (ref 36.0–46.0)
Hemoglobin: 12 g/dL (ref 12.0–15.0)
Lymphocytes Relative: 29.7 % (ref 12.0–46.0)
Lymphs Abs: 2.7 K/uL (ref 0.7–4.0)
MCHC: 31.9 g/dL (ref 30.0–36.0)
MCV: 86 fl (ref 78.0–100.0)
Monocytes Absolute: 0.5 K/uL (ref 0.1–1.0)
Monocytes Relative: 5.5 % (ref 3.0–12.0)
Neutro Abs: 5.7 K/uL (ref 1.4–7.7)
Neutrophils Relative %: 62.3 % (ref 43.0–77.0)
Platelets: 371 K/uL (ref 150.0–400.0)
RBC: 4.36 Mil/uL (ref 3.87–5.11)
RDW: 15.1 % (ref 11.5–15.5)
WBC: 9.2 K/uL (ref 4.0–10.5)

## 2024-02-28 LAB — IBC + FERRITIN
Ferritin: 19.1 ng/mL (ref 10.0–291.0)
Iron: 42 ug/dL (ref 42–145)
Saturation Ratios: 10.3 % — ABNORMAL LOW (ref 20.0–50.0)
TIBC: 408.8 ug/dL (ref 250.0–450.0)
Transferrin: 292 mg/dL (ref 212.0–360.0)

## 2024-02-28 LAB — LIPASE: Lipase: 39 U/L (ref 11.0–59.0)

## 2024-02-28 NOTE — Telephone Encounter (Signed)
 Spoke with [name] per Dr Michele patient may return to exercise next week when she has back on her medications and her blood pressures are better controlled. Patient states understanding and says she is going to pick up her medications today.Hadassah Elpidio Quan RN BSN

## 2024-03-02 ENCOUNTER — Ambulatory Visit: Payer: Self-pay | Admitting: Physician Assistant

## 2024-03-02 ENCOUNTER — Other Ambulatory Visit (HOSPITAL_BASED_OUTPATIENT_CLINIC_OR_DEPARTMENT_OTHER): Payer: Self-pay | Admitting: Pharmacist

## 2024-03-02 DIAGNOSIS — Z79899 Other long term (current) drug therapy: Secondary | ICD-10-CM

## 2024-03-02 DIAGNOSIS — D509 Iron deficiency anemia, unspecified: Secondary | ICD-10-CM

## 2024-03-02 NOTE — Telephone Encounter (Signed)
 PT returning call. Please advise.

## 2024-03-02 NOTE — Progress Notes (Signed)
 Pharmacy Quality Measure Review  This patient is appearing on a report for being at risk of failing the adherence measure for hypertension (ACEi/ARB) medications this calendar year.   Medication: losartan -hydrochlorothiazide  Last fill date: 02/02/2024 for 90 day supply per pharmacy.  Herlene Fleeta Morris, PharmD, JAQUELINE, CPP Clinical Pharmacist [facility] [phone]

## 2024-03-03 ENCOUNTER — Encounter (HOSPITAL_COMMUNITY): Payer: Self-pay | Admitting: *Deleted

## 2024-03-03 DIAGNOSIS — Z951 Presence of aortocoronary bypass graft: Secondary | ICD-10-CM

## 2024-03-03 NOTE — Progress Notes (Signed)
 Cardiac Individual Treatment Plan  Patient Details  Name: [name] MRN: [id number] Date of Birth: 20-Jan-1956 Referring Provider:   Flowsheet Row INTENSIVE CARDIAC REHAB ORIENT from 11/28/2023 in [facility]  Referring Provider Dr. 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  Patient's Home Medications on Admission:  Current Outpatient Medications:    Accu-Chek Softclix Lancets lancets, Use to check blood sugar once daily., Disp: 100 each, Rfl: 2   amLODipine  (NORVASC ) 5 MG tablet, Take 5 mg by mouth daily., Disp: , Rfl:    aspirin  EC 81 MG tablet, Take 1 tablet (81 mg total) by mouth daily., Disp: , Rfl:    Blood Glucose Monitoring Suppl (ACCU-CHEK GUIDE ME) w/Device KIT, USE TO CHECK BLOOD SUGAR ONCE DAILY, Disp: 1 kit, Rfl: 0   clopidogrel  (PLAVIX ) 75 MG tablet, Take 1 tablet (75 mg total) by mouth daily., Disp: 90 tablet, Rfl: 3   dapagliflozin  propanediol (FARXIGA ) 10 MG TABS tablet, Take 1 tablet (10 mg total) by mouth daily before breakfast., Disp: 90 tablet, Rfl: 1   dicyclomine  (BENTYL ) 10 MG capsule, Take 1 capsule (10 mg total) by mouth 3 (three) times daily before meals., Disp: 90 capsule, Rfl: 11   DULoxetine  (CYMBALTA ) 30 MG capsule, Take 1 capsule (30 mg total) by mouth daily., Disp: 180 capsule, Rfl: 1   Evolocumab  (REPATHA  SURECLICK) 140 MG/ML SOAJ, Inject 140 mg into the skin every 14 (fourteen) days., Disp: 6 mL, Rfl: 3   ezetimibe  (ZETIA ) 10 MG tablet, Take 1 tablet (10 mg total) by mouth daily., Disp: 90 tablet, Rfl: 3   fluconazole  (DIFLUCAN ) 150 MG tablet, Take 1 tablet (150 mg total) by mouth daily., Disp: 1 tablet, Rfl: 1   furosemide  (LASIX ) 20 MG tablet, Take 1 tablet (20 mg total) by mouth daily as needed.,  Disp: 90 tablet, Rfl: 3   glucose blood (ACCU-CHEK GUIDE TEST) test strip, Use to check blood sugar once daily., Disp: 100 each, Rfl: 2   hydrOXYzine  (ATARAX ) 25 MG tablet, Take 1 tablet (25 mg total) by mouth every 6 (six) hours., Disp: 12 tablet, Rfl: 0   JANUVIA  25 MG tablet, Take 25 mg by mouth daily., Disp: , Rfl:    losartan -hydrochlorothiazide  (HYZAAR ) 100-25 MG tablet, Take 1 tablet by mouth daily., Disp: 90 tablet, Rfl: 3   metoprolol  tartrate (LOPRESSOR ) 100 MG tablet, TAKE 1 TABLET IN THE AM AND 1.5 TABLETS IN THE PM, Disp: 225 tablet, Rfl: 3   ondansetron  (ZOFRAN -ODT) 4 MG disintegrating tablet, Take 1 tablet (4 mg total) by mouth every 8 (eight) hours as needed., Disp: 12 tablet, Rfl: 0   pantoprazole  (PROTONIX ) 40 MG tablet, Take 1 tablet (40 mg total) by mouth daily., Disp: 90 tablet, Rfl: 3   potassium chloride  SA (KLOR-CON  M) 20 MEQ tablet, Take 1 tablet (20 mEq total) by mouth daily as needed (when taking furosemide  (lasix ))., Disp: 90 tablet, Rfl: 3   pregabalin (LYRICA) 50 MG capsule, Take 50 mg by mouth 3 (three) times daily., Disp: , Rfl:    rosuvastatin  (CRESTOR ) 40 MG tablet, Take 1 tablet (40 mg total) by mouth daily., Disp: 90 tablet, Rfl: 3  Past Medical History: Past Medical History:  Diagnosis Date   Anemia    hx  Aneurysm (HCC)    small, on left side of brain    Anxiety    hx of   Arthritis    generalized   CAD (coronary artery disease)    CHF (congestive heart failure) (HCC) 2025   Diabetes mellitus without complication (HCC)    diet control, pt denies- on medications   GERD (gastroesophageal reflux disease)    iwht certain foods/on meds/OTC PRN meds   Heart failure with mildly reduced ejection fraction (HFmrEF) (HCC)    Hyperlipidemia    on meds   Hypertension    on meds   TIA (transient ischemic attack)    pt unaware of this hx on 06/19/2017    Tobacco Use: Social History   Tobacco Use  Smoking Status Former   Types: Cigarettes  Smokeless  Tobacco Never    Labs: Review Flowsheet  More data exists      Latest Ref Rng & Units 07/03/2023 07/25/2023 11/08/2023 01/24/2024 02/19/2024  Labs for ITP Cardiac and Pulmonary Rehab  Cholestrol 100 - 199 mg/dL - 748  - 747  -  LDL (calc) 0 - 99 mg/dL - 842  - 853  -  HDL-C >39 mg/dL - 59  - 76  -  Trlycerides 0 - 149 mg/dL - 806  - 831  -  Hemoglobin A1c 0.0 - 7.0 % 6.2  - 7.3  - 6.7   PH, Arterial 7.35 - 7.45 7.321  7.318  7.429  7.453  7.414  7.390  7.458  7.420  - - - -  PCO2 arterial 32 - 48 mmHg 46.7  46.4  38.1  36.7  40.8  33.2  30.2  27.1  - - - -  Bicarbonate 20.0 - 28.0 mmol/L 24.1  23.8  25.6  25.7  26.1  20.1  22.9  21.4  17.6  - - - -  TCO2 22 - 32 mmol/L 26  25  27  27  28  27   37  21  24  22  24  20  18   - - - -  Acid-base deficit 0.0 - 2.0 mmol/L 2.0  2.0  5.0  2.0  2.0  6.0  - - - -  O2 Saturation % 99  99  99  100  100  100  86  100  100  - - - -    Details       Multiple values from one day are sorted in reverse-chronological order         Capillary Blood Glucose: Lab Results  Component Value Date   GLUCAP 181 (H) 12/18/2023   GLUCAP 147 (H) 12/04/2023   GLUCAP 193 (H) 12/04/2023   GLUCAP 143 (H) 12/02/2023   GLUCAP 199 (H) 12/02/2023     Exercise Target Goals: Exercise Program Goal: Individual exercise prescription set using results from initial 6 min walk test and THRR while considering  patient's activity barriers and safety.   Exercise Prescription Goal: Initial exercise prescription builds to 30-45 minutes a day of aerobic activity, 2-3 days per week.  Home exercise guidelines will be given to patient during program as part of exercise prescription that the participant will acknowledge.  Activity Barriers & Risk Stratification:   6 Minute Walk:   Oxygen Initial Assessment:   Oxygen Re-Evaluation:   Oxygen Discharge (Final Oxygen Re-Evaluation):   Initial Exercise Prescription:   Perform Capillary Blood Glucose checks as  needed.  Exercise Prescription Changes:   Exercise Prescription Changes  Row Name 01/06/24 1034 01/27/24 1037 02/10/24 1035 02/12/24 1032       Response to Exercise   Blood Pressure (Admit) 102/62 132/82 132/74 132/80    Blood Pressure (Exit) 114/64 114/78 108/72 122/70    Heart Rate (Admit) 97 bpm 90 bpm 86 bpm 91 bpm    Heart Rate (Exercise) 134 bpm 132 bpm 133 bpm 128 bpm    Heart Rate (Exit) 105 bpm 99 bpm 95 bpm 95 bpm    Rating of Perceived Exertion (Exercise) 11 11 11 11     Symptoms None None None None    Duration Continue with 30 min of aerobic exercise without signs/symptoms of physical distress. Continue with 30 min of aerobic exercise without signs/symptoms of physical distress. Continue with 30 min of aerobic exercise without signs/symptoms of physical distress. Continue with 30 min of aerobic exercise without signs/symptoms of physical distress.    Intensity THRR unchanged THRR unchanged THRR unchanged THRR unchanged      Progression   Progression Continue to progress workloads to maintain intensity without signs/symptoms of physical distress. Continue to progress workloads to maintain intensity without signs/symptoms of physical distress. Continue to progress workloads to maintain intensity without signs/symptoms of physical distress. Continue to progress workloads to maintain intensity without signs/symptoms of physical distress.    Average METs 2.5 2.6 2.6 2.7      Resistance Training   Training Prescription Yes Yes Yes No    Weight 2 lbs 2 lbs 2 lbs Relaxation day, no weights.    Reps 10-15 10-15 10-15 --    Time 10 Minutes 10 Minutes 10 Minutes --      Interval Training   Interval Training No No No No      Recumbant Bike   Level -- -- 2 2    RPM -- -- 72 72    Watts -- -- 21 22    Minutes -- -- 15 15    METs -- -- 2.2 2.3      NuStep   Level 2 2 3 3     SPM 88 85 92 95    Minutes 30 30 15 15     METs 2.5 2.6 3.1 3.2      Home Exercise Plan   Plans to  continue exercise at [employer] (comment)  Walking, gym at her residence [employer] (comment)  Walking, gym at her residence [employer] (comment)  Walking, gym at her residence [employer] (comment)  Walking, gym at her residence    Frequency Add 2 additional days to program exercise sessions. Add 2 additional days to program exercise sessions. Add 2 additional days to program exercise sessions. Add 2 additional days to program exercise sessions.    Initial Home Exercises Provided 12/20/23 12/20/23 12/20/23 12/20/23       Exercise Comments:   Exercise Comments     Row Name 01/17/24 1051 01/29/24 1100         Exercise Comments Reviewed goals with Rayven. Reviewed METs and goals with Faven.         Exercise Goals and Review:   Exercise Goals Re-Evaluation :  Exercise Goals Re-Evaluation     Row Name 01/17/24 1051 01/29/24 1100 02/28/24 1711         Exercise Goal Re-Evaluation   Exercise Goals Review Increase Physical Activity;Increase Strength and Stamina;Able to understand and use rate of perceived exertion (RPE) scale;Understanding of Exercise Prescription;Knowledge and understanding of Target Heart Rate Range (THRR) Increase Physical Activity;Increase Strength and Stamina;Able  to understand and use rate of perceived exertion (RPE) scale;Understanding of Exercise Prescription;Knowledge and understanding of Target Heart Rate Range (THRR) Increase Physical Activity;Increase Strength and Stamina;Able to understand and use rate of perceived exertion (RPE) scale;Understanding of Exercise Prescription;Knowledge and understanding of Target Heart Rate Range (THRR)     Comments Meghan is making gradual progress with exercise. She is walking 10 minutes in the morning and 20 minutes in the afternoon. She is also doing the warm-up cool-down stretches from [facility]. She feels she's made a lot of progress in the program. Her energy level has gotten a lot better. She has  neuropathy and is still a little off balance because of this. ICR has helped her a lot. Treacy continues to make gradual progress with exercise. Discussed activity progression and how to increase MET level. She will increase her steps/min on the NuStep. She is interested in trying a new piece of equipment. Will try the recumbent bike next session. Damiya has been absent since 02/12/24. She was in the ED on 02/16/24 c/o chest pain and hypertension. She's been seen in follow-up and cleared to resume cardiac rehab next week.     Expected Outcomes Continue to progress workloads as tolerated to help with energy and improve balance. Will add recumbent bike to Casaundra's exercise prescription. Will review goals upon return to cardiac rehab.        Discharge Exercise Prescription (Final Exercise Prescription Changes):  Exercise Prescription Changes - 02/12/24 1032       Response to Exercise   Blood Pressure (Admit) 132/80    Blood Pressure (Exit) 122/70    Heart Rate (Admit) 91 bpm    Heart Rate (Exercise) 128 bpm    Heart Rate (Exit) 95 bpm    Rating of Perceived Exertion (Exercise) 11    Symptoms None    Duration Continue with 30 min of aerobic exercise without signs/symptoms of physical distress.    Intensity THRR unchanged      Progression   Progression Continue to progress workloads to maintain intensity without signs/symptoms of physical distress.    Average METs 2.7      Resistance Training   Training Prescription No    Weight Relaxation day, no weights.      Interval Training   Interval Training No      Recumbant Bike   Level 2    RPM 72    Watts 22    Minutes 15    METs 2.3      NuStep   Level 3    SPM 95    Minutes 15    METs 3.2      Home Exercise Plan   Plans to continue exercise at [employer] (comment)   Walking, gym at her residence   Frequency Add 2 additional days to program exercise sessions.    Initial Home Exercises Provided 12/20/23           Nutrition:  Target Goals: Understanding of nutrition guidelines, daily intake of sodium 1500mg , cholesterol 200mg , calories 30% from fat and 7% or less from saturated fats, daily to have 5 or more servings of fruits and vegetables.  Biometrics:    Nutrition Therapy Plan and Nutrition Goals:  Nutrition Therapy & Goals - 01/20/24 1110       Nutrition Therapy   Diet Heart healthy/Carbohydrat consistent diet    Drug/Food Interactions Statins/Certain Fruits      Personal Nutrition Goals   Nutrition Goal Patient to identify strategies for reducing cardiovascular  risk by attending the [facility] education and nutrition series weekly.   goal in progress.   Personal Goal #2 Patient to improve diet quality by using the plate method as a guide for meal planning to include lean protein/plant protein, fruits, vegetables, whole grains, nonfat dairy as part of a well-balanced diet.   goal in progress.   Personal Goal #3 Patient to identify strategies for improving blood sugar with goal <7% A1c.   goal in progress.   Comments Goals in progress. Jacci has medical history of CABGx2, HFrEF, HTN, Hyperlipidemia, TIA, DM2. A1c is not at goal. LDL is not at goal (treated with repatha , zetia , crestor ). She continues to attend the [facility] education/nutrition series regularly. She continues to work on weight gain; we have discussed multiple strategies for weight gain including increasing eating frequency, calorie density, nutrition supplements. She is up 9.7# since starting with our program. She does report her typical adult weight is ~126#. Patient will benefit from participation in intensive cardiac rehab for nutrition education, exercise, and lifestyle modification.      Intervention Plan   Intervention Prescribe, educate and counsel regarding individualized specific dietary modifications aiming towards targeted core components such as weight, hypertension, lipid management, diabetes, heart failure and  other comorbidities.;Nutrition handout(s) given to patient.    Expected Outcomes Short Term Goal: Understand basic principles of dietary content, such as calories, fat, sodium, cholesterol and nutrients.;Long Term Goal: Adherence to prescribed nutrition plan.          Nutrition Assessments:  MEDIFICTS Score Key: >=70 Need to make dietary changes  40-70 Heart Healthy Diet <= 40 Therapeutic Level Cholesterol Diet    Picture Your Plate Scores: <59 Unhealthy dietary pattern with much room for improvement. 41-50 Dietary pattern unlikely to meet recommendations for good health and room for improvement. 51-60 More healthful dietary pattern, with some room for improvement.  >60 Healthy dietary pattern, although there may be some specific behaviors that could be improved.    Nutrition Goals Re-Evaluation:  Nutrition Goals Re-Evaluation     Row Name 01/20/24 1110             Goals   Current Weight 115 lb 11.9 oz (52.5 kg)       Comment A1c 7.3, cholesterol 251, triglycerides 193, LDL 157. BMI is low/normal for age.       Expected Outcome Goals in progress. Nykeria has medical history of CABGx2, HFrEF, HTN, Hyperlipidemia, TIA, DM2. A1c is not at goal. LDL is not at goal (treated with repatha , zetia , crestor ). She continues to attend the [facility] education/nutrition series regularly. She continues to work on weight gain; we have discussed multiple strategies for weight gain including increasing eating frequency, calorie density, nutrition supplements. She is up 9.7# since starting with our program. She does report her typical adult weight is ~126#. Patient will benefit from participation in intensive cardiac rehab for nutrition education, exercise, and lifestyle modification.          Nutrition Goals Re-Evaluation:  Nutrition Goals Re-Evaluation     Row Name 01/20/24 1110             Goals   Current Weight 115 lb 11.9 oz (52.5 kg)       Comment A1c 7.3, cholesterol 251,  triglycerides 193, LDL 157. BMI is low/normal for age.       Expected Outcome Goals in progress. Debbe has medical history of CABGx2, HFrEF, HTN, Hyperlipidemia, TIA, DM2. A1c is not at goal. LDL is not at goal (  treated with repatha , zetia , crestor ). She continues to attend the [facility] education/nutrition series regularly. She continues to work on weight gain; we have discussed multiple strategies for weight gain including increasing eating frequency, calorie density, nutrition supplements. She is up 9.7# since starting with our program. She does report her typical adult weight is ~126#. Patient will benefit from participation in intensive cardiac rehab for nutrition education, exercise, and lifestyle modification.          Nutrition Goals Discharge (Final Nutrition Goals Re-Evaluation):  Nutrition Goals Re-Evaluation - 01/20/24 1110       Goals   Current Weight 115 lb 11.9 oz (52.5 kg)    Comment A1c 7.3, cholesterol 251, triglycerides 193, LDL 157. BMI is low/normal for age.    Expected Outcome Goals in progress. Allianna has medical history of CABGx2, HFrEF, HTN, Hyperlipidemia, TIA, DM2. A1c is not at goal. LDL is not at goal (treated with repatha , zetia , crestor ). She continues to attend the [facility] education/nutrition series regularly. She continues to work on weight gain; we have discussed multiple strategies for weight gain including increasing eating frequency, calorie density, nutrition supplements. She is up 9.7# since starting with our program. She does report her typical adult weight is ~126#. Patient will benefit from participation in intensive cardiac rehab for nutrition education, exercise, and lifestyle modification.          Psychosocial: Target Goals: Acknowledge presence or absence of significant depression and/or stress, maximize coping skills, provide positive support system. Participant is able to verbalize types and ability to use techniques and skills needed for  reducing stress and depression.  Initial Review & Psychosocial Screening:   Quality of Life Scores:  Scores of 19 and below usually indicate a poorer quality of life in these areas.  A difference of  2-3 points is a clinically meaningful difference.  A difference of 2-3 points in the total score of the Quality of Life Index has been associated with significant improvement in overall quality of life, self-image, physical symptoms, and general health in studies assessing change in quality of life.  PHQ-9: Review Flowsheet  More data exists      02/19/2024 02/06/2024 12/11/2023 11/28/2023 11/08/2023  Depression screen PHQ 2/9  Decreased Interest 0 0 0 0 0  Down, Depressed, Hopeless 0 0 0 0 0  PHQ - 2 Score 0 0 0 0 0  Altered sleeping 0 0 - 1 -  Tired, decreased energy 0 0 - 1 -  Change in appetite 0 0 - 1 -  Feeling bad or failure about yourself  0 0 - 0 -  Trouble concentrating 0 0 - 0 -  Moving slowly or fidgety/restless 0 0 - 0 -  Suicidal thoughts 0 0 - 0 -  PHQ-9 Score 0 0 - 3 -  Difficult doing work/chores - Not difficult at all Not difficult at all Not difficult at all -   Interpretation of Total Score  Total Score Depression Severity:  1-4 = Minimal depression, 5-9 = Mild depression, 10-14 = Moderate depression, 15-19 = Moderately severe depression, 20-27 = Severe depression   Psychosocial Evaluation and Intervention:   Psychosocial Re-Evaluation:  Psychosocial Re-Evaluation     Row Name 02/04/24 1431 03/03/24 1347           Psychosocial Re-Evaluation   Current issues with Current Anxiety/Panic;Current Stress Concerns Current Anxiety/Panic;Current Stress Concerns      Comments Kaleia has not voiced any increased concerns or stressors during exercise at cardiac rehab. Torianne  will complete cardiac rehab on 02/19/24. Last day of exercise was on 02/12/24. Unable to reasess. Tenatively scheduled to return to exercise the week of 03/02/24.      Expected Outcomes Suzzane will  have controlled or decreased stressors upon completion of cardiac rehab Maxyne will have controlled or decreased stressors upon completion of cardiac rehab      Interventions Stress management education;Relaxation education;Encouraged to attend Cardiac Rehabilitation for the exercise Stress management education;Relaxation education;Encouraged to attend Cardiac Rehabilitation for the exercise      Continue Psychosocial Services  Follow up required by staff Follow up required by staff        Initial Review   Source of Stress Concerns Chronic Illness;Financial;Family Chronic Illness;Financial;Family      Comments Will continue to monitor and offer support as needed. Will continue to monitor and offer support as needed.         Psychosocial Discharge (Final Psychosocial Re-Evaluation):  Psychosocial Re-Evaluation - 03/03/24 1347       Psychosocial Re-Evaluation   Current issues with Current Anxiety/Panic;Current Stress Concerns    Comments Last day of exercise was on 02/12/24. Unable to reasess. Tenatively scheduled to return to exercise the week of 03/02/24.    Expected Outcomes Nakaiya will have controlled or decreased stressors upon completion of cardiac rehab    Interventions Stress management education;Relaxation education;Encouraged to attend Cardiac Rehabilitation for the exercise    Continue Psychosocial Services  Follow up required by staff      Initial Review   Source of Stress Concerns Chronic Illness;Financial;Family    Comments Will continue to monitor and offer support as needed.          Vocational Rehabilitation: Provide vocational rehab assistance to qualifying candidates.   Vocational Rehab Evaluation & Intervention:   Education: Education Goals: Education classes will be provided on a weekly basis, covering required topics. Participant will state understanding/return demonstration of topics presented.    Education     Row Name 01/06/24 1100     Education    Cardiac Education Topics [facility]   Hospital doctor Education   General Education Hypertension and Heart Disease   Instruction Review Code 1- Verbalizes Understanding   Class Start Time 1150   Class Stop Time 1225   Class Time Calculation (min) 35 min    Row Name 01/10/24 1300     Education   Cardiac Education Topics [facility]   [employer] Workshops     Workshops   Educator Exercise Physiologist   [employer] Exercise   Exercise Workshop Managing Heart Disease: Your Path to a Healthier Heart   Instruction Review Code 1- Verbalizes Understanding   Class Start Time 1148   Class Stop Time 1239   Class Time Calculation (min) 51 min    Row Name 01/13/24 1300     Education   Cardiac Education Topics [facility]   Geographical information systems officer Psychosocial   Psychosocial Workshop From Head to Heart: The Power of a Healthy Outlook   Instruction Review Code 1- Verbalizes Understanding   Class Start Time 1152   Class Stop Time 1234   Class Time Calculation (min) 42 min    Row Name 01/15/24 1100     Education   Cardiac Education Topics [facility]   Customer service manager  Weekly Topic Tasty Appetizers and Snacks   Instruction Review Code 1- Verbalizes Understanding   Class Start Time 1145   Class Stop Time 1225   Class Time Calculation (min) 40 min    Row Name 01/17/24 1200     Education   Cardiac Education Topics [facility]   Hospital doctor Education   General Education Heart Disease Risk Reduction   Instruction Review Code 1- Verbalizes Understanding   Class Start Time 1150   Class Stop Time 1240   Class Time Calculation (min) 50 min    Row Name 01/20/24 1300     Education   Cardiac Education Topics [facility]   Architect Exercise Physiologist   [facility] Psychosocial   Psychosocial Healthy Minds, Bodies, Hearts   Instruction Review Code 1- Verbalizes Understanding   Class Start Time 1157   Class Stop Time 1233   Class Time Calculation (min) 36 min    Row Name 01/22/24 1600     Education   Cardiac Education Topics [facility]   Customer service manager   Weekly Topic Adding Flavor - Sodium-Free   Instruction Review Code 1- Verbalizes Understanding   Class Start Time 1140   Class Stop Time 1215   Class Time Calculation (min) 35 min    Row Name 01/27/24 1500     Education   Cardiac Education Topics [facility]   Glass blower/designer Nutrition   Nutrition Workshop Label Reading   Instruction Review Code 1- Verbalizes Understanding   Class Start Time 1145   Class Stop Time 1230   Class Time Calculation (min) 45 min    Row Name 01/29/24 1000     Education   Cardiac Education Topics [facility]   Customer service manager   Weekly Topic Fast and Healthy Breakfasts   Instruction Review Code [id number] Understanding    Row Name 02/07/24 1100     Education   Cardiac Education Topics [facility]   Licensed conveyancer Nutrition   Nutrition Overview of the [facility] Eating Plan   Instruction Review Code [place] Understanding    Row Name 02/10/24 1100     Education   Cardiac Education Topics [employer]   [employer]     Workshops   Educator Exercise Physiologist   [facility]   Psychosocial Workshop Recognizing and Reducing Stress   Instruction Review Code 1- Verbalizes Understanding   Class Start Time 1150   Class Stop Time 1232   Class Time Calculation (min) 42 min    Row Name 02/12/24 1300     Education   Cardiac Education Topics [employer]   Cabin crew   Weekly Topic [employer] Desserts   Instruction Review Code 1- Verbalizes Understanding   Class Start Time 1145   Class Stop Time 1225   Class Time Calculation (min) 40 min      Core Videos: Exercise    Move It!  Clinical staff conducted group or individual video education with verbal and written material and guidebook.  Patient learns the recommended [facility]  exercise program. Exercise with the goal of living a long, healthy life. Some of the health benefits of exercise include controlled diabetes, healthier blood pressure levels, improved cholesterol levels, improved heart and lung capacity, improved sleep, and better body composition. Everyone should speak with their doctor before starting or changing an exercise routine.  Biomechanical Limitations Clinical staff conducted group or individual video education with verbal and written material and guidebook.  Patient learns how biomechanical limitations can impact exercise and how we can mitigate and possibly overcome limitations to have an impactful and balanced exercise routine.  Body Composition Clinical staff conducted group or individual video education with verbal and written material and guidebook.  Patient learns that body composition (ratio of muscle mass to fat mass) is a key component to assessing overall fitness, rather than body weight alone. Increased fat mass, especially visceral belly fat, can put us  at increased risk for metabolic syndrome, type 2 diabetes, heart disease, and even death. It is recommended to combine diet and exercise (cardiovascular and resistance training) to improve your body composition. Seek guidance from your physician and exercise physiologist before implementing an exercise routine.  Exercise Action Plan Clinical staff conducted group or individual video education with verbal and written material and guidebook.  Patient learns the recommended strategies to achieve and enjoy  long-term exercise adherence, including variety, self-motivation, self-efficacy, and positive decision making. Benefits of exercise include fitness, good health, weight management, more energy, better sleep, less stress, and overall well-being.  Medical   Heart Disease Risk Reduction Clinical staff conducted group or individual video education with verbal and written material and guidebook.  Patient learns our heart is our most vital organ as it circulates oxygen, nutrients, white blood cells, and hormones throughout the entire body, and carries waste away. Data supports a plant-based eating plan like the [facility] Program for its effectiveness in slowing progression of and reversing heart disease. The video provides a number of recommendations to address heart disease.   Metabolic Syndrome and Belly Fat  Clinical staff conducted group or individual video education with verbal and written material and guidebook.  Patient learns what metabolic syndrome is, how it leads to heart disease, and how one can reverse it and keep it from coming back. You have metabolic syndrome if you have 3 of the following 5 criteria: abdominal obesity, high blood pressure, high triglycerides, low HDL cholesterol, and high blood sugar.  Hypertension and Heart Disease Clinical staff conducted group or individual video education with verbal and written material and guidebook.  Patient learns that high blood pressure, or hypertension, is very common in the [place] . Hypertension is largely due to excessive salt intake, but other important risk factors include being overweight, physical inactivity, drinking too much alcohol, smoking, and not eating enough potassium from fruits and vegetables. High blood pressure is a leading risk factor for heart attack, stroke, congestive heart failure, dementia, kidney failure, and premature death. Long-term effects of excessive salt intake include stiffening of the arteries and thickening  of heart muscle and organ damage. Recommendations include ways to reduce hypertension and the risk of heart disease.  Diseases of Our Time - Focusing on Diabetes Clinical staff conducted group or individual video education with verbal and written material and guidebook.  Patient learns why the best way to stop diseases of our time is prevention, through food and other lifestyle changes. Medicine (such as prescription pills and surgeries) is often only a Band-Aid on the problem, not a long-term solution. Most common diseases  of our time include obesity, type 2 diabetes, hypertension, heart disease, and cancer. The [employer] Program is recommended and has been proven to help reduce, reverse, and/or prevent the damaging effects of metabolic syndrome.  Nutrition   Overview of the [employer] Eating Plan  Clinical staff conducted group or individual video education with verbal and written material and guidebook.  Patient learns about the [employer] Eating Plan for disease risk reduction. The [employer] Eating Plan emphasizes a wide variety of unrefined, minimally-processed carbohydrates, like fruits, vegetables, whole grains, and legumes. Go, Caution, and Stop food choices are explained. Plant-based and lean animal proteins are emphasized. Rationale provided for low sodium intake for blood pressure control, low added sugars for blood sugar stabilization, and low added fats and oils for coronary artery disease risk reduction and weight management.  Calorie Density  Clinical staff conducted group or individual video education with verbal and written material and guidebook.  Patient learns about calorie density and how it impacts the [facility]in Eating Plan. Knowing the characteristics of the food you choose will help you decide whether those foods will lead to weight gain or weight loss, and whether you want to consume more or less of them. Weight loss is usually a side effect of the [employer]in Eating Plan because of its  focus on low calorie-dense foods.  Label Reading  Clinical staff conducted group or individual video education with verbal and written material and guidebook.  Patient learns about the [employer] recommended label reading guidelines and corresponding recommendations regarding calorie density, added sugars, sodium content, and whole grains.  Dining Out - Part 1  Clinical staff conducted group or individual video education with verbal and written material and guidebook.  Patient learns that restaurant meals can be sabotaging because they can be so high in calories, fat, sodium, and/or sugar. Patient learns recommended strategies on how to positively address this and avoid unhealthy pitfalls.  Facts on Fats  Clinical staff conducted group or individual video education with verbal and written material and guidebook.  Patient learns that lifestyle modifications can be just as effective, if not more so, as many medications for lowering your risk of heart disease. A [employer] lifestyle can help to reduce your risk of inflammation and atherosclerosis (cholesterol build-up, or plaque, in the artery walls). Lifestyle interventions such as dietary choices and physical activity address the cause of atherosclerosis. A review of the types of fats and their impact on blood cholesterol levels, along with dietary recommendations to reduce fat intake is also included.  Nutrition Action Plan  Clinical staff conducted group or individual video education with verbal and written material and guidebook.  Patient learns how to incorporate [facility] recommendations into their lifestyle. Recommendations include planning and keeping personal health goals in mind as an important part of their success.  Healthy Mind-Set    Healthy Minds, Bodies, Hearts  Clinical staff conducted group or individual video education with verbal and written material and guidebook.  Patient learns how to identify when they are stressed. Video will  discuss the impact of that stress, as well as the many benefits of stress management. Patient will also be introduced to stress management techniques. The way we think, act, and feel has an impact on our hearts.  How Our Thoughts Can Heal Our Hearts  Clinical staff conducted group or individual video education with verbal and written material and guidebook.  Patient learns that negative thoughts can cause depression and anxiety. This can result in negative lifestyle behavior and serious health problems.  Cognitive behavioral therapy is an effective method to help control our thoughts in order to change and improve our emotional outlook.  Additional Videos:  Exercise    Improving Performance  Clinical staff conducted group or individual video education with verbal and written material and guidebook.  Patient learns to use a non-linear approach by alternating intensity levels and lengths of time spent exercising to help burn more calories and lose more body fat. Cardiovascular exercise helps improve heart health, metabolism, hormonal balance, blood sugar control, and recovery from fatigue. Resistance training improves strength, endurance, balance, coordination, reaction time, metabolism, and muscle mass. Flexibility exercise improves circulation, posture, and balance. Seek guidance from your physician and exercise physiologist before implementing an exercise routine and learn your capabilities and proper form for all exercise.  Introduction to Yoga  Clinical staff conducted group or individual video education with verbal and written material and guidebook.  Patient learns about yoga, a discipline of the coming together of mind, breath, and body. The benefits of yoga include improved flexibility, improved range of motion, better posture and core strength, increased lung function, weight loss, and positive self-image. Yoga's heart health benefits include lowered blood pressure, healthier heart rate,  decreased cholesterol and triglyceride levels, improved immune function, and reduced stress. Seek guidance from your physician and exercise physiologist before implementing an exercise routine and learn your capabilities and proper form for all exercise.  Medical   Aging: Enhancing Your Quality of Life  Clinical staff conducted group or individual video education with verbal and written material and guidebook.  Patient learns key strategies and recommendations to stay in good physical health and enhance quality of life, such as prevention strategies, having an advocate, securing a Health Care Proxy and Power of Attorney, and keeping a list of medications and system for tracking them. It also discusses how to avoid risk for bone loss.  Biology of Weight Control  Clinical staff conducted group or individual video education with verbal and written material and guidebook.  Patient learns that weight gain occurs because we consume more calories than we burn (eating more, moving less). Even if your body weight is normal, you may have higher ratios of fat compared to muscle mass. Too much body fat puts you at increased risk for cardiovascular disease, heart attack, stroke, type 2 diabetes, and obesity-related cancers. In addition to exercise, following the [employer] Eating Plan can help reduce your risk.  Decoding Lab Results  Clinical staff conducted group or individual video education with verbal and written material and guidebook.  Patient learns that lab test reflects one measurement whose values change over time and are influenced by many factors, including medication, stress, sleep, exercise, food, hydration, pre-existing medical conditions, and more. It is recommended to use the knowledge from this video to become more involved with your lab results and evaluate your numbers to speak with your doctor.   Diseases of Our Time - Overview  Clinical staff conducted group or individual video education with  verbal and written material and guidebook.  Patient learns that according to the CDC, 50% to 70% of chronic diseases (such as obesity, type 2 diabetes, elevated lipids, hypertension, and heart disease) are avoidable through lifestyle improvements including healthier food choices, listening to satiety cues, and increased physical activity.  Sleep Disorders Clinical staff conducted group or individual video education with verbal and written material and guidebook.  Patient learns how good quality and duration of sleep are important to overall health and well-being. Patient also learns about  sleep disorders and how they impact health along with recommendations to address them, including discussing with a physician.  Nutrition  Dining Out - Part 2 Clinical staff conducted group or individual video education with verbal and written material and guidebook.  Patient learns how to plan ahead and communicate in order to maximize their dining experience in a healthy and nutritious manner. Included are recommended food choices based on the type of restaurant the patient is visiting.   Fueling a Banker conducted group or individual video education with verbal and written material and guidebook.  There is a strong connection between our food choices and our health. Diseases like obesity and type 2 diabetes are very prevalent and are in large-part due to lifestyle choices. The [employer] Eating Plan provides plenty of food and hunger-curbing satisfaction. It is easy to follow, affordable, and helps reduce health risks.  Menu Workshop  Clinical staff conducted group or individual video education with verbal and written material and guidebook.  Patient learns that restaurant meals can sabotage health goals because they are often packed with calories, fat, sodium, and sugar. Recommendations include strategies to plan ahead and to communicate with the manager, chef, or server to help order a  healthier meal.  Planning Your Eating Strategy  Clinical staff conducted group or individual video education with verbal and written material and guidebook.  Patient learns about the [employer] Eating Plan and its benefit of reducing the risk of disease. The [employer] Eating Plan does not focus on calories. Instead, it emphasizes high-quality, nutrient-rich foods. By knowing the characteristics of the foods, we choose, we can determine their calorie density and make informed decisions.  Targeting Your Nutrition Priorities  Clinical staff conducted group or individual video education with verbal and written material and guidebook.  Patient learns that lifestyle habits have a tremendous impact on disease risk and progression. This video provides eating and physical activity recommendations based on your personal health goals, such as reducing LDL cholesterol, losing weight, preventing or controlling type 2 diabetes, and reducing high blood pressure.  Vitamins and Minerals  Clinical staff conducted group or individual video education with verbal and written material and guidebook.  Patient learns different ways to obtain key vitamins and minerals, including through a recommended healthy diet. It is important to discuss all supplements you take with your doctor.   Healthy Mind-Set    Smoking Cessation  Clinical staff conducted group or individual video education with verbal and written material and guidebook.  Patient learns that cigarette smoking and tobacco addiction pose a serious health risk which affects millions of people. Stopping smoking will significantly reduce the risk of heart disease, lung disease, and many forms of cancer. Recommended strategies for quitting are covered, including working with your doctor to develop a successful plan.  Culinary   Becoming a Set designer conducted group or individual video education with verbal and written material and guidebook.   Patient learns that cooking at home can be healthy, cost-effective, quick, and puts them in control. Keys to cooking healthy recipes will include looking at your recipe, assessing your equipment needs, planning ahead, making it simple, choosing cost-effective seasonal ingredients, and limiting the use of added fats, salts, and sugars.  Cooking - Breakfast and Snacks  Clinical staff conducted group or individual video education with verbal and written material and guidebook.  Patient learns how important breakfast is to satiety and nutrition through the entire day. Recommendations include key foods to eat  during breakfast to help stabilize blood sugar levels and to prevent overeating at meals later in the day. Planning ahead is also a key component.  Cooking - Educational psychologist conducted group or individual video education with verbal and written material and guidebook.  Patient learns eating strategies to improve overall health, including an approach to cook more at home. Recommendations include thinking of animal protein as a side on your plate rather than center stage and focusing instead on lower calorie dense options like vegetables, fruits, whole grains, and plant-based proteins, such as beans. Making sauces in large quantities to freeze for later and leaving the skin on your vegetables are also recommended to maximize your experience.  Cooking - Healthy Salads and Dressing Clinical staff conducted group or individual video education with verbal and written material and guidebook.  Patient learns that vegetables, fruits, whole grains, and legumes are the foundations of the [facility]in Eating Plan. Recommendations include how to incorporate each of these in flavorful and healthy salads, and how to create homemade salad dressings. Proper handling of ingredients is also covered. Cooking - Soups and [employer] - Soups and Desserts Clinical staff conducted group or individual video  education with verbal and written material and guidebook.  Patient learns that Pritikin soups and desserts make for easy, nutritious, and delicious snacks and meal components that are low in sodium, fat, sugar, and calorie density, while high in vitamins, minerals, and filling fiber. Recommendations include simple and healthy ideas for soups and desserts.   Overview     The Pritikin Solution Program Overview Clinical staff conducted group or individual video education with verbal and written material and guidebook.  Patient learns that the results of the Pritikin Program have been documented in more than 100 articles published in peer-reviewed journals, and the benefits include reducing risk factors for (and, in some cases, even reversing) high cholesterol, high blood pressure, type 2 diabetes, obesity, and more! An overview of the three key pillars of the Pritikin Program will be covered: eating well, doing regular exercise, and having a healthy mind-set.  WORKSHOPS  Exercise: Exercise Basics: Building Your Action Plan Clinical staff led group instruction and group discussion with PowerPoint presentation and patient guidebook. To enhance the learning environment the use of posters, models and videos may be added. At the conclusion of this workshop, patients will comprehend the difference between physical activity and exercise, as well as the benefits of incorporating both, into their routine. Patients will understand the FITT (Frequency, Intensity, Time, and Type) principle and how to use it to build an exercise action plan. In addition, safety concerns and other considerations for exercise and cardiac rehab will be addressed by the presenter. The purpose of this lesson is to promote a comprehensive and effective weekly exercise routine in order to improve patients' overall level of fitness.   Managing Heart Disease: Your Path to a Healthier Heart Clinical staff led group instruction and group  discussion with PowerPoint presentation and patient guidebook. To enhance the learning environment the use of posters, models and videos may be added.At the conclusion of this workshop, patients will understand the anatomy and physiology of the heart. Additionally, they will understand how Pritikin's three pillars impact the risk factors, the progression, and the management of heart disease.  The purpose of this lesson is to provide a high-level overview of the heart, heart disease, and how the Pritikin lifestyle positively impacts risk factors.  Exercise Biomechanics Clinical staff led group instruction  and group discussion with PowerPoint presentation and patient guidebook. To enhance the learning environment the use of posters, models and videos may be added. Patients will learn how the structural parts of their bodies function and how these functions impact their daily activities, movement, and exercise. Patients will learn how to promote a neutral spine, learn how to manage pain, and identify ways to improve their physical movement in order to promote healthy living. The purpose of this lesson is to expose patients to common physical limitations that impact physical activity. Participants will learn practical ways to adapt and manage aches and pains, and to minimize their effect on regular exercise. Patients will learn how to maintain good posture while sitting, walking, and lifting.  Balance Training and Fall Prevention  Clinical staff led group instruction and group discussion with PowerPoint presentation and patient guidebook. To enhance the learning environment the use of posters, models and videos may be added. At the conclusion of this workshop, patients will understand the importance of their sensorimotor skills (vision, proprioception, and the vestibular system) in maintaining their ability to balance as they age. Patients will apply a variety of balancing exercises that are  appropriate for their current level of function. Patients will understand the common causes for poor balance, possible solutions to these problems, and ways to modify their physical environment in order to minimize their fall risk. The purpose of this lesson is to teach patients about the importance of maintaining balance as they age and ways to minimize their risk of falling.  WORKSHOPS   Nutrition:  Fueling a Ship broker led group instruction and group discussion with PowerPoint presentation and patient guidebook. To enhance the learning environment the use of posters, models and videos may be added. Patients will review the foundational principles of the Pritikin Eating Plan and understand what constitutes a serving size in each of the food groups. Patients will also learn Pritikin-friendly foods that are better choices when away from home and review make-ahead meal and snack options. Calorie density will be reviewed and applied to three nutrition priorities: weight maintenance, weight loss, and weight gain. The purpose of this lesson is to reinforce (in a group setting) the key concepts around what patients are recommended to eat and how to apply these guidelines when away from home by planning and selecting Pritikin-friendly options. Patients will understand how calorie density may be adjusted for different weight management goals.  Mindful Eating  Clinical staff led group instruction and group discussion with PowerPoint presentation and patient guidebook. To enhance the learning environment the use of posters, models and videos may be added. Patients will briefly review the concepts of the Pritikin Eating Plan and the importance of low-calorie dense foods. The concept of mindful eating will be introduced as well as the importance of paying attention to internal hunger signals. Triggers for non-hunger eating and techniques for dealing with triggers will be explored. The purpose of  this lesson is to provide patients with the opportunity to review the basic principles of the P[employer]ikin Eating Plan, discuss the value of eating mindfully and how to measure internal cues of hunger and fullness using the Hunger Scale. Patients will also discuss reasons for non-hunger eating and learn strategies to use for controlling emotional eating.  Targeting Your Nutrition Priorities Clinical staff led group instruction and group discussion with PowerPoint presentation and patient guidebook. To enhance the learning environment the use of posters, models and videos may be added. Patients will learn how to determine  their genetic susceptibility to disease by reviewing their family history. Patients will gain insight into the importance of diet as part of an overall healthy lifestyle in mitigating the impact of genetics and other environmental insults. The purpose of this lesson is to provide patients with the opportunity to assess their personal nutrition priorities by looking at their family history, their own health history and current risk factors. Patients will also be able to discuss ways of prioritizing and modifying the Pritikin Eating Plan for their highest risk areas  Menu  Clinical staff led group instruction and group discussion with PowerPoint presentation and patient guidebook. To enhance the learning environment the use of posters, models and videos may be added. Using menus brought in from [employer], or printed from [employer], patients will apply the Pritikin dining out guidelines that were presented in the [employer] video. Patients will also be able to practice these guidelines in a variety of provided scenarios. The purpose of this lesson is to provide patients with the opportunity to practice hands-on learning of the Pritikin Dining Out guidelines with actual menus and practice scenarios.  Label Reading Clinical staff led group instruction and group  discussion with PowerPoint presentation and patient guidebook. To enhance the learning environment the use of posters, models and videos may be added. Patients will review and discuss the Pritikin label reading guidelines presented in Pritikin's Label Reading Educational series video. Using fool labels brought in from local grocery stores and markets, patients will apply the label reading guidelines and determine if the packaged food meet the Pritikin guidelines. The purpose of this lesson is to provide patients with the opportunity to review, discuss, and practice hands-on learning of the Pritikin Label Reading guidelines with actual packaged food labels. Cooking School  Pritikin's [employer] are designed to teach patients ways to prepare quick, simple, and affordable recipes at home. The importance of nutrition's role in chronic disease risk reduction is reflected in its emphasis in the overall Pritikin program. By learning how to prepare essential core Pritikin Eating Plan recipes, patients will increase control over what they eat; be able to customize the flavor of foods without the use of added salt, sugar, or fat; and improve the quality of the food they consume. By learning a set of core recipes which are easily assembled, quickly prepared, and affordable, patients are more likely to prepare more healthy foods at home. These workshops focus on convenient breakfasts, simple entres, side dishes, and desserts which can be prepared with minimal effort and are consistent with nutrition recommendations for cardiovascular risk reduction. Cooking [employer] are taught by a Armed forces logistics/support/administrative officer (RD) who has been trained by the [employer]. The chef or RD has a clear understanding of the importance of minimizing - if not completely eliminating - added fat, sugar, and sodium in recipes. Throughout the series of Cooking School Workshop sessions, patients will learn about  healthy ingredients and efficient methods of cooking to build confidence in their capability to prepare    Cooking School weekly topics:  Adding Flavor- Sodium-Free  Fast and Healthy Breakfasts  Powerhouse Plant-Based Proteins  Satisfying Salads and Dressings  Simple Sides and Sauces  International Cuisine-Spotlight on the [employer] Zones  Delicious Desserts  Savory Soups  [employer] - Meals in a Astronomer Appetizers and Snacks  Comforting Weekend Breakfasts  One-Pot Wonders   Fast Evening Meals  Landscape architect Your Pritikin Plate  WORKSHOPS  Healthy Mindset (Psychosocial):  Focused Goals, Sustainable Changes Clinical staff led group instruction and group discussion with PowerPoint presentation and patient guidebook. To enhance the learning environment the use of posters, models and videos may be added. Patients will be able to apply effective goal setting strategies to establish at least one personal goal, and then take consistent, meaningful action toward that goal. They will learn to identify common barriers to achieving personal goals and develop strategies to overcome them. Patients will also gain an understanding of how our mind-set can impact our ability to achieve goals and the importance of cultivating a positive and growth-oriented mind-set. The purpose of this lesson is to provide patients with a deeper understanding of how to set and achieve personal goals, as well as the tools and strategies needed to overcome common obstacles which may arise along the way.  From Head to Heart: The Power of a Healthy Outlook  Clinical staff led group instruction and group discussion with PowerPoint presentation and patient guidebook. To enhance the learning environment the use of posters, models and videos may be added. Patients will be able to recognize and describe the impact of emotions and mood on physical health. They will discover the importance of self-care and  explore self-care practices which may work for them. Patients will also learn how to utilize the 4 C's to cultivate a healthier outlook and better manage stress and challenges. The purpose of this lesson is to demonstrate to patients how a healthy outlook is an essential part of maintaining good health, especially as they continue their cardiac rehab journey.  Healthy Sleep for a Healthy Heart Clinical staff led group instruction and group discussion with PowerPoint presentation and patient guidebook. To enhance the learning environment the use of posters, models and videos may be added. At the conclusion of this workshop, patients will be able to demonstrate knowledge of the importance of sleep to overall health, well-being, and quality of life. They will understand the symptoms of, and treatments for, common sleep disorders. Patients will also be able to identify daytime and nighttime behaviors which impact sleep, and they will be able to apply these tools to help manage sleep-related challenges. The purpose of this lesson is to provide patients with a general overview of sleep and outline the importance of quality sleep. Patients will learn about a few of the most common sleep disorders. Patients will also be introduced to the concept of "sleep hygiene," and discover ways to self-manage certain sleeping problems through simple daily behavior changes. Finally, the workshop will motivate patients by clarifying the links between quality sleep and their goals of heart-healthy living.   Recognizing and Reducing Stress Clinical staff led group instruction and group discussion with PowerPoint presentation and patient guidebook. To enhance the learning environment the use of posters, models and videos may be added. At the conclusion of this workshop, patients will be able to understand the types of stress reactions, differentiate between acute and chronic stress, and recognize the impact that chronic stress has on  their health. They will also be able to apply different coping mechanisms, such as reframing negative self-talk. Patients will have the opportunity to practice a variety of stress management techniques, such as deep abdominal breathing, progressive muscle relaxation, and/or guided imagery.  The purpose of this lesson is to educate patients on the role of stress in their lives and to provide healthy techniques for coping with it.  Learning Barriers/Preferences:   Education Topics:  Knowledge Questionnaire Score:  Core Components/Risk Factors/Patient Goals at Admission:   Core Components/Risk Factors/Patient Goals Review:   Goals and Risk Factor Review     Row Name 02/04/24 1434 03/03/24 1358           Core Components/Risk Factors/Patient Goals Review   Personal Goals Review Weight Management/Obesity;Lipids;Hypertension;Stress;Diabetes;Heart Failure Weight Management/Obesity;Lipids;Hypertension;Stress;Diabetes;Heart Failure      Review Chana continues to  do well with exercise at  cardiac rehab. Vital sings remain stable. Marrisa has increased her met level. Destine has gained 4.0  kg as weight gain has been a goal for her. Ora will complete cardiac rehab on 02/19/24 Shamicka continues to  do well with exercise at  cardiac rehab. Vital sings remain stable. Tully has increased her met level. Shamanda has gained 4.0  kg as weight gain has been a goal for her. Earlena will complete cardiac rehab on 02/19/24      Expected Outcomes Miquel will continue to particpate in cardiac rehab for exercise, nutrition and lifestyle modificaiotns Asha will continue to particpate in cardiac rehab for exercise, nutrition and lifestyle modificaiotns         Core Components/Risk Factors/Patient Goals at Discharge (Final Review):   Goals and Risk Factor Review - 03/03/24 1358       Core Components/Risk Factors/Patient Goals Review   Personal Goals Review Weight  Management/Obesity;Lipids;Hypertension;Stress;Diabetes;Heart Failure    Review Rio continues to  do well with exercise at  cardiac rehab. Vital sings remain stable. Courtney has increased her met level. Shaquna has gained 4.0  kg as weight gain has been a goal for her. Jenelle will complete cardiac rehab on 02/19/24    Expected Outcomes Keali will continue to particpate in cardiac rehab for exercise, nutrition and lifestyle modificaiotns          ITP Comments:  ITP Comments     Row Name 02/04/24 1428 03/03/24 1343         ITP Comments 30 Day ITP Review. Biance continues to have good participation in cardiac rehab when in attendance. Cadience will complete cardiac rehab on 02/19/24. 30 Day ITP Review. Lashandra is scheduled to return to cardiac rehab  the week of September 8th.         Comments: See ITP comments.Hadassah Elpidio Quan RN BSN

## 2024-03-04 ENCOUNTER — Telehealth (HOSPITAL_COMMUNITY): Payer: Self-pay

## 2024-03-04 ENCOUNTER — Ambulatory Visit (HOSPITAL_COMMUNITY)
Admission: RE | Admit: 2024-03-04 | Discharge: 2024-03-04 | Disposition: A | Discharge status: Home / Self Care | Source: Ambulatory Visit | Attending: Physician Assistant | Admitting: Physician Assistant

## 2024-03-04 ENCOUNTER — Encounter (HOSPITAL_COMMUNITY): Payer: Self-pay

## 2024-03-04 DIAGNOSIS — R112 Nausea with vomiting, unspecified: Secondary | ICD-10-CM | POA: Diagnosis not present

## 2024-03-04 DIAGNOSIS — R109 Unspecified abdominal pain: Secondary | ICD-10-CM | POA: Diagnosis not present

## 2024-03-04 DIAGNOSIS — R1110 Vomiting, unspecified: Secondary | ICD-10-CM | POA: Diagnosis not present

## 2024-03-04 DIAGNOSIS — R1013 Epigastric pain: Secondary | ICD-10-CM | POA: Diagnosis not present

## 2024-03-04 MED ORDER — TECHNETIUM TC 99M SULFUR COLLOID
2.0000 | Freq: Once | INTRAVENOUS | Status: AC | PRN
  Administered 2024-03-04: 2 via ORAL

## 2024-03-04 MED ORDER — TECHNETIUM TC 99M SULFUR COLLOID: 1.9500 | Freq: Once | INTRAVENOUS | Status: DC

## 2024-03-04 NOTE — Telephone Encounter (Signed)
 Patient c/o for 10:15 CR class, states she has a test this morning and will not be in.

## 2024-03-05 ENCOUNTER — Ambulatory Visit (HOSPITAL_COMMUNITY)
Admission: RE | Admit: 2024-03-05 | Discharge: 2024-03-05 | Disposition: A | Discharge status: Home / Self Care | Source: Ambulatory Visit | Attending: Cardiology | Admitting: Cardiology

## 2024-03-05 DIAGNOSIS — I5022 Chronic systolic (congestive) heart failure: Secondary | ICD-10-CM | POA: Diagnosis not present

## 2024-03-05 DIAGNOSIS — I2510 Atherosclerotic heart disease of native coronary artery without angina pectoris: Secondary | ICD-10-CM | POA: Diagnosis not present

## 2024-03-05 LAB — ECHOCARDIOGRAM COMPLETE
Area-P 1/2: 8.34 cm2
S' Lateral: 3.2 cm

## 2024-03-05 NOTE — Progress Notes (Signed)
 Agree with the assessment and plan as outlined by Quentin Mulling, PA-C. ? ?Keron Neenan, DO, FACG ? ?

## 2024-03-06 ENCOUNTER — Encounter (HOSPITAL_COMMUNITY)
Admission: RE | Admit: 2024-03-06 | Source: Ambulatory Visit | Attending: Cardiovascular Disease | Admitting: Cardiovascular Disease

## 2024-03-09 ENCOUNTER — Encounter: Payer: Self-pay | Admitting: Pharmacist Clinician (PhC)/ Clinical Pharmacy Specialist

## 2024-03-09 ENCOUNTER — Ambulatory Visit: Attending: Cardiology | Admitting: Pharmacist Clinician (PhC)/ Clinical Pharmacy Specialist

## 2024-03-09 ENCOUNTER — Inpatient Hospital Stay (INDEPENDENT_AMBULATORY_CARE_PROVIDER_SITE_OTHER): Admitting: Primary Care

## 2024-03-09 DIAGNOSIS — E7801 Familial hypercholesterolemia: Secondary | ICD-10-CM | POA: Diagnosis not present

## 2024-03-09 DIAGNOSIS — I5022 Chronic systolic (congestive) heart failure: Secondary | ICD-10-CM | POA: Diagnosis not present

## 2024-03-09 NOTE — Progress Notes (Signed)
 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 female patient of Dr Large, in the office today to discuss options for cholesterol management.  She was prescribed Repatha  earlier this year, but has never picked up because of high out of pocket costs.  She also takes Jardiance and [employer] Carrier:  [facility] Medicare [phone]  [phone] deductible for brand products, then $47/month  Pharmacy:  [facility]   [facility]:   not currently, signed up in office today  LDL Cholesterol goal:  LDL < 55  Current Medications:   evolocumab  140 mg - never filled ezetimibe  10 mg - compliant with fills rosuvastatin  40 mg - compliant with fills   Previously tried:    Family Hx: father died MI before age 41, mother had HTN, DM, dementia, siblings (6) all with hypertension, 3 with DM   Diet:  has had financial difficulties, limits what she is buying at the store.  Trying to eat 4 smaller meals as opposed to 3 standard.      Exercise: walking with friend most days  Adherence Assessment  Do you ever forget to take your medication? [] Yes [x] No (has stopped 2/2 costs, but takes when she has them)  Do you ever skip doses due to side effects? [] Yes [x] No  Do you have trouble affording your medicines? [x] Yes [] No  Are you ever unable to pick up your medication due to transportation difficulties? [] Yes [x] No    Accessory Clinical Findings   Lab Results  Component  Value Date   CHOL 252 (H) 01/24/2024   HDL 76 01/24/2024   LDLCALC 146 (H) 01/24/2024   TRIG 168 (H) 01/24/2024   CHOLHDL 3.3 01/24/2024    Lipoprotein (a)  Date/Time Value Ref Range Status  06/25/2023 12:02 PM <8.4 <75.0 nmol/L Final    Comment:    (NOTE) **Results verified by repeat testing** Note:  Values greater than or equal to 75.0 nmol/L may       indicate an independent risk factor for CHD,       but must be evaluated with caution when applied       to non-Caucasian populations due to the       influence of genetic factors on Lp(a) across       ethnicities. Performed At: [facility] [address] [place], [place] [id number] Jennette Shorter MD Ey:[id number]     Lab Results  Component Value Date   ALT 8 02/28/2024   AST 13 02/28/2024   ALKPHOS 91 02/28/2024   BILITOT 0.3 02/28/2024   Lab Results  Component Value Date   CREATININE 0.71 02/28/2024   BUN 17 02/28/2024   NA 142 02/28/2024   K 3.6 02/28/2024   CL 102 02/28/2024   CO2 27 02/28/2024   Lab Results  Component Value Date   HGBA1C 6.7 02/19/2024    Home Medications    Current Outpatient Medications  Medication Sig  Dispense Refill   Accu-Chek Softclix Lancets lancets Use to check blood sugar once daily. 100 each 2   amLODipine  (NORVASC ) 5 MG tablet Take 5 mg by mouth daily.     aspirin  EC 81 MG tablet Take 1 tablet (81 mg total) by mouth daily.     Blood Glucose Monitoring Suppl (ACCU-CHEK GUIDE ME) w/Device KIT USE TO CHECK BLOOD SUGAR ONCE DAILY 1 kit 0   clopidogrel  (PLAVIX ) 75 MG tablet Take 1 tablet (75 mg total) by mouth daily. 90 tablet 3   dapagliflozin  propanediol (FARXIGA ) 10 MG TABS tablet Take 1 tablet (10 mg total) by mouth daily before breakfast. 90 tablet 1   dicyclomine  (BENTYL ) 10 MG capsule Take 1 capsule (10 mg total) by mouth 3 (three) times daily before meals. 90 capsule 11   DULoxetine  (CYMBALTA ) 30 MG capsule Take 1 capsule (30 mg total) by mouth daily. 180 capsule 1    Evolocumab  (REPATHA  SURECLICK) 140 MG/ML SOAJ Inject 140 mg into the skin every 14 (fourteen) days. 6 mL 3   ezetimibe  (ZETIA ) 10 MG tablet Take 1 tablet (10 mg total) by mouth daily. 90 tablet 3   fluconazole  (DIFLUCAN ) 150 MG tablet Take 1 tablet (150 mg total) by mouth daily. 1 tablet 1   furosemide  (LASIX ) 20 MG tablet Take 1 tablet (20 mg total) by mouth daily as needed. 90 tablet 3   glucose blood (ACCU-CHEK GUIDE TEST) test strip Use to check blood sugar once daily. 100 each 2   hydrOXYzine  (ATARAX ) 25 MG tablet Take 1 tablet (25 mg total) by mouth every 6 (six) hours. 12 tablet 0   JANUVIA  25 MG tablet Take 25 mg by mouth daily.     losartan -hydrochlorothiazide  (HYZAAR ) 100-25 MG tablet Take 1 tablet by mouth daily. 90 tablet 3   metoprolol  tartrate (LOPRESSOR ) 100 MG tablet TAKE 1 TABLET IN THE AM AND 1.5 TABLETS IN THE PM 225 tablet 3   ondansetron  (ZOFRAN -ODT) 4 MG disintegrating tablet Take 1 tablet (4 mg total) by mouth every 8 (eight) hours as needed. 12 tablet 0   pantoprazole  (PROTONIX ) 40 MG tablet Take 1 tablet (40 mg total) by mouth daily. 90 tablet 3   potassium chloride  SA (KLOR-CON  M) 20 MEQ tablet Take 1 tablet (20 mEq total) by mouth daily as needed (when taking furosemide  (lasix )). 90 tablet 3   pregabalin (LYRICA) 50 MG capsule Take 50 mg by mouth 3 (three) times daily.     rosuvastatin  (CRESTOR ) 40 MG tablet Take 1 tablet (40 mg total) by mouth daily. 90 tablet 3   No current facility-administered medications for this visit.   Facility-Administered Medications Ordered in Other Visits  Medication Dose Route Frequency Provider Last Rate Last Admin   technetium sulfur  colloid ([place]COMED[place]) injection solution 1.95 millicurie  1.95 millicurie Oral Once Joan Birmingham, MD         Assessment & Plan    Heart failure with mildly reduced ejection fraction [facility]) Patient having difficulty with cost of Jardiance.  Applied for [employer] for her and was accepted.    ID         [id number] BIN      [id number] PCN    [id number] GRP    [id number]  Expires 02/06/25  Hyperlipidemia Assessment: Patient with ASCVD and familial hyperlipidemia not at LDL goal of < 55 (high was 189) Most recent LDL 146 on 01/24/24 Has been non-compliant secondary to cost constraints with high intensity statin/ezetimibe  : rosuvastatin  40 mg, ezetimibe  10  mg Reviewed mechanisms of action, dosing, side effects, potential decreases in LDL cholesterol and costs - related to Repatha .  Also reviewed potential options for patient assistance.  Plan: Patient agreeable to starting Repatha  140 mg q14d Continue with rosuvastatin  40, ezetimibe  10 Repeat labs after:  2 months Lipid Liver function Patient approved for [facility] for hypercholesterolemia  ID        [id number] BIN      [id number] PCN    [id number] GRP    [id number]   Exp 02/06/2025   Allean Mink, PharmD CPP [facility] [address]   [place], [place] [address] [phone]  03/09/2024, 11:21 AM

## 2024-03-09 NOTE — Assessment & Plan Note (Signed)
 Patient having difficulty with cost of Jardiance.  Applied for [employer] for her and was accepted.    ID        [id number] BIN      [id number] PCN    [id number] GRP    [id number]  Expires 02/06/25

## 2024-03-09 NOTE — Patient Instructions (Addendum)
 Your Results:             Your most recent labs Goal  Total Cholesterol 252 < 200  Triglycerides 168 < 150  HDL (happy/good cholesterol) 76 > 40  LDL (lousy/bad cholesterol 146 < 55   Medication changes:  Start Repatha  140 mg every 14 days.    Continue with rosuvastatin  and ezetimibe   Lab orders:  We want to repeat labs after 2-3 months.  We will send you a lab order to remind you once we get closer to that time.    Patient Assistance:    Bethene Ferretti for cholesterol medications:  Repatha , rosuvastatin , ezetimibe   ID [id number]  BIN [id number]  PCN [id number]  GRP [id number]     [facility] Grant for Heart failure medications:  Jardiance    ID [id number]  BIN [id number]  PCN [id number]  GRP [id number]   Thank you for choosing [facility]

## 2024-03-09 NOTE — Assessment & Plan Note (Signed)
 Assessment: Patient with ASCVD and familial hyperlipidemia not at LDL goal of < 55 (high was 189) Most recent LDL 146 on 01/24/24 Has been non-compliant secondary to cost constraints with high intensity statin/ezetimibe  : rosuvastatin  40 mg, ezetimibe  10 mg Reviewed mechanisms of action, dosing, side effects, potential decreases in LDL cholesterol and costs - related to Repatha .  Also reviewed potential options for patient assistance.  Plan: Patient agreeable to starting Repatha  140 mg q14d Continue with rosuvastatin  40, ezetimibe  10 Repeat labs after:  2 months Lipid Liver function Patient approved for [facility] for hypercholesterolemia  ID        [id number] BIN      [id number] PCN    PXXPDMI GRP    [id number]   Exp 02/06/2025

## 2024-03-11 ENCOUNTER — Other Ambulatory Visit (HOSPITAL_BASED_OUTPATIENT_CLINIC_OR_DEPARTMENT_OTHER): Admitting: Pharmacist

## 2024-03-11 ENCOUNTER — Telehealth (HOSPITAL_COMMUNITY): Payer: Self-pay | Admitting: *Deleted

## 2024-03-11 ENCOUNTER — Inpatient Hospital Stay (INDEPENDENT_AMBULATORY_CARE_PROVIDER_SITE_OTHER): Admitting: Primary Care

## 2024-03-11 ENCOUNTER — Encounter (HOSPITAL_COMMUNITY)

## 2024-03-11 DIAGNOSIS — Z79899 Other long term (current) drug therapy: Secondary | ICD-10-CM

## 2024-03-11 NOTE — Telephone Encounter (Addendum)
 Pt phoned in to advise cardiac rehab staff of her resting bp and should she return to exercise today.  [name] reports her BP is 137/87. Reports she resumed her bp medications that she was taking before.  Started a couple of days ago.  Bp are improving not yet at goal.  Advised to hold exercise today and continue to take her bp medications as prescribed.  Continue to limit salt intake.  Return on Friday if Diastolic BP(rest) is lower than 85. In agreement of this and verbalized understanding. Saturnino Bernett PEAK, BSN Cardiac and Emergency planning/management officer

## 2024-03-11 NOTE — Progress Notes (Signed)
 Pharmacy Quality Measure Review  This patient is appearing on a report for being at risk of failing the adherence measure for hypertension (ACEi/ARB) medications this calendar year.   Medication: losartan -hydrochlorothiazide  Last fill date: 11/08/23 for 90 day supply per pharmacy.  I called and got a refill ready for this, furosemide , and rosuvastatin  a couple of weeks ago but she was unable to pick this up.   Call placed to her pharmacy today. They are refilling these for her. I then called Ms. Marnie. She admits to struggling with the cost of her medications. She thinks she will be able to pick-up these refills and says she will go by the pharmacy later today.   Reminder set next week to check on her.   [name], PharmD, JAQUELINE, CPP Clinical Pharmacist [facility] [phone]

## 2024-03-12 ENCOUNTER — Telehealth: Payer: Self-pay | Admitting: Emergency Medicine

## 2024-03-12 ENCOUNTER — Telehealth: Payer: Self-pay | Admitting: Pharmacist Clinician (PhC)/ Clinical Pharmacy Specialist

## 2024-03-12 ENCOUNTER — Telehealth: Payer: Self-pay | Admitting: Cardiology

## 2024-03-12 NOTE — Telephone Encounter (Signed)
 Pt states she was approved for a grant and would like the prescription to be sent to the pharmacy however the pt didn't know the name of the medication and didn't provide me with any information.

## 2024-03-12 NOTE — Telephone Encounter (Signed)
 Left message to call office.

## 2024-03-12 NOTE — Telephone Encounter (Signed)
 Pt c/o medication issue:  1. Name of Medication: Jardiance  2. How are you currently taking this medication (dosage and times per day)?    3. Are you having a reaction (difficulty breathing--STAT)? No  4. What is your medication issue? Patient was calling in to get a prescription for the above medication. Please advise

## 2024-03-12 NOTE — Telephone Encounter (Signed)
 Patient wanting to know if pharmacist was going to switch her from farxiga  to jardiance.

## 2024-03-12 NOTE — Telephone Encounter (Signed)
 Pt called in asking to speak with nurse, she has a couple questions about her most recent echo results.

## 2024-03-12 NOTE — Telephone Encounter (Signed)
 [facility] grant for Repatha   I will forward to PharmD

## 2024-03-12 NOTE — Telephone Encounter (Signed)
*  STAT* If patient is at the pharmacy, call can be transferred to refill team.   1. Which medications need to be refilled? (please list name of each medication and dose if known) Evolocumab  (REPATHA  SURECLICK) 140 MG/ML SOAJ    2. Would you like to learn more about the convenience, safety, & potential cost savings by using the [facility]?    3. Are you open to using the [facility] (Type [facility]. ).   4. Which pharmacy/location (including street and city if local pharmacy) is medication to be sent to? [id number] [place] [place] - [address]    5. Do they need a 30 day or 90 day supply? 90 day

## 2024-03-13 ENCOUNTER — Encounter (HOSPITAL_COMMUNITY)

## 2024-03-13 MED ORDER — REPATHA SURECLICK 140 MG/ML ~~LOC~~ SOAJ: 140.0000 mg | SUBCUTANEOUS | 3 refills | Status: DC

## 2024-03-13 MED ORDER — DAPAGLIFLOZIN PROPANEDIOL 10 MG PO TABS: 10.0000 mg | ORAL_TABLET | Freq: Every day | ORAL | 3 refills | Status: DC

## 2024-03-13 NOTE — Telephone Encounter (Signed)
 Per [name]'s notes Mis-spoke at her appointment, stating she was on Jardiance, but she is actually on Farxiga . No need to change from Farxiga  at this time. She is currently out of refills, so will send updated rx to pharmacy. She notes they have the [employer] information, just need new rx.  Called patient , N/A LVM per DPR continue taking Farxiga , prescription sent to preferred pharmacy

## 2024-03-13 NOTE — Telephone Encounter (Signed)
 Spoke with patient.  Mis-spoke at her appointment, stating she was on Jardiance, but she is actually on Farxiga .  No need to change from Farxiga  at this time.  She is currently out of refills, so will send updated rx to pharmacy.  She notes they have the [employer] information, just need new rx.

## 2024-03-13 NOTE — Telephone Encounter (Signed)
 Patient is following up requesting feedback. She says she is completely out of medication.

## 2024-03-13 NOTE — Telephone Encounter (Signed)
 Called patient left message on personal voice mail I will send message to Pharm D for advice.

## 2024-03-16 ENCOUNTER — Encounter (HOSPITAL_COMMUNITY)

## 2024-03-16 ENCOUNTER — Telehealth (HOSPITAL_COMMUNITY): Payer: Self-pay

## 2024-03-16 ENCOUNTER — Other Ambulatory Visit (INDEPENDENT_AMBULATORY_CARE_PROVIDER_SITE_OTHER)

## 2024-03-16 ENCOUNTER — Other Ambulatory Visit (HOSPITAL_COMMUNITY): Payer: Self-pay

## 2024-03-16 DIAGNOSIS — D509 Iron deficiency anemia, unspecified: Secondary | ICD-10-CM

## 2024-03-16 LAB — HEMOCCULT SLIDES (X 3 CARDS)
Fecal Occult Blood: NEGATIVE
OCCULT 1: NEGATIVE
OCCULT 2: NEGATIVE
OCCULT 3: NEGATIVE
OCCULT 4: NEGATIVE
OCCULT 5: NEGATIVE

## 2024-03-16 MED ORDER — DAPAGLIFLOZIN PROPANEDIOL 10 MG PO TABS
10.0000 mg | ORAL_TABLET | Freq: Every day | ORAL | 3 refills | Status: DC
  Filled 2024-03-16 (×2): qty 90, 90d supply, fill #0

## 2024-03-16 NOTE — Telephone Encounter (Signed)
 Patient c/o for 10:15 CR class, states her blood pressure is 146/101 and is still struggling to lower it. She states she is calling her doctor today.

## 2024-03-16 NOTE — Telephone Encounter (Signed)
 Called patient back about her message. Patient stated [facility] was trying to fill her farxiga  at cost, and she has a grant that can be used. Informed patient that we could fill at [facility] instead and they should be able to work with her grant. Will forward to Kristin [facility] to let her know that another prescription was sent to pharmacy at [facility], just in case she need to do anything else with changing pharmacy.

## 2024-03-16 NOTE — Telephone Encounter (Signed)
Pt requesting a c/b.

## 2024-03-16 NOTE — Addendum Note (Signed)
 Addended by: DRENA MARTINIS, Mandalyn Pasqua L on: 03/16/2024 10:54 AM   Modules accepted: Orders

## 2024-03-17 ENCOUNTER — Ambulatory Visit: Payer: Self-pay | Admitting: Physician Assistant

## 2024-03-18 ENCOUNTER — Encounter (HOSPITAL_COMMUNITY)
Admission: RE | Admit: 2024-03-18 | Source: Ambulatory Visit | Attending: Cardiovascular Disease | Admitting: Cardiovascular Disease

## 2024-03-18 ENCOUNTER — Other Ambulatory Visit (HOSPITAL_BASED_OUTPATIENT_CLINIC_OR_DEPARTMENT_OTHER): Payer: Self-pay | Admitting: Pharmacist

## 2024-03-18 DIAGNOSIS — I10 Essential (primary) hypertension: Secondary | ICD-10-CM

## 2024-03-18 NOTE — Telephone Encounter (Signed)
Spoke with pt, questions regarding echocardiogram answered.

## 2024-03-18 NOTE — Progress Notes (Signed)
 Pharmacy Quality Measure Review  This patient is appearing on a report for being at risk of failing the adherence measure for hypertension (ACEi/ARB) medications this calendar year.   Medication: losartan -hydrochlorothiazide  Last fill date: 03/12/2024 for 90 day supply per pharmacy.  Herlene Fleeta Morris, PharmD, JAQUELINE, CPP Clinical Pharmacist [facility] [phone]

## 2024-03-20 ENCOUNTER — Encounter (HOSPITAL_COMMUNITY)
Admission: RE | Admit: 2024-03-20 | Source: Ambulatory Visit | Attending: Cardiovascular Disease | Admitting: Cardiovascular Disease

## 2024-03-20 DIAGNOSIS — Z951 Presence of aortocoronary bypass graft: Secondary | ICD-10-CM | POA: Insufficient documentation

## 2024-03-23 ENCOUNTER — Telehealth (INDEPENDENT_AMBULATORY_CARE_PROVIDER_SITE_OTHER): Payer: Self-pay | Admitting: Primary Care

## 2024-03-23 ENCOUNTER — Encounter (HOSPITAL_COMMUNITY)
Admission: RE | Admit: 2024-03-23 | Discharge: 2024-03-23 | Disposition: A | Discharge status: Home / Self Care | Source: Ambulatory Visit | Attending: Cardiology | Admitting: Cardiology

## 2024-03-23 DIAGNOSIS — Z951 Presence of aortocoronary bypass graft: Secondary | ICD-10-CM

## 2024-03-23 NOTE — Telephone Encounter (Signed)
 Called pt to reschedule appt. Pt did not answer and LVM for pt to call back so that we could rsh.

## 2024-03-23 NOTE — Telephone Encounter (Unsigned)
 Copied from CRM #[id number]. Topic: General - Other >> Mar 23, 2024  2:08 PM Sophia H wrote: Reason for CRM: Returning [facility] phone call, no answer at [facility]. Pls reach out. Ty # [phone]

## 2024-03-24 ENCOUNTER — Other Ambulatory Visit (INDEPENDENT_AMBULATORY_CARE_PROVIDER_SITE_OTHER): Payer: Self-pay | Admitting: Primary Care

## 2024-03-24 NOTE — Telephone Encounter (Signed)
 Patient returned call  Appointment rescheduled

## 2024-03-25 ENCOUNTER — Encounter (HOSPITAL_COMMUNITY)
Admission: RE | Admit: 2024-03-25 | Discharge: 2024-03-25 | Disposition: A | Discharge status: Home / Self Care | Source: Ambulatory Visit | Attending: Cardiovascular Disease | Admitting: Cardiovascular Disease

## 2024-03-25 DIAGNOSIS — Z951 Presence of aortocoronary bypass graft: Secondary | ICD-10-CM | POA: Diagnosis present

## 2024-03-26 ENCOUNTER — Telehealth: Payer: Self-pay | Admitting: Gastroenterology

## 2024-03-26 ENCOUNTER — Inpatient Hospital Stay (INDEPENDENT_AMBULATORY_CARE_PROVIDER_SITE_OTHER): Admitting: Primary Care

## 2024-03-26 MED ORDER — DICYCLOMINE HCL 10 MG PO CAPS: 10.0000 mg | ORAL_CAPSULE | Freq: Three times a day (TID) | ORAL | 1 refills | Status: DC

## 2024-03-26 NOTE — Addendum Note (Signed)
 Addended by: MADAN, Nancie Bocanegra L on: 03/26/2024 01:16 PM   Modules accepted: Orders

## 2024-03-26 NOTE — Telephone Encounter (Signed)
 Discontinued on 02/27/24, dose change.  Requested Prescriptions  Pending Prescriptions Disp Refills   pantoprazole  (PROTONIX ) 20 MG tablet [Pharmacy Med Name: PANTOPRAZOLE  SOD DR 20 MG TAB] 90 tablet 1    Sig: TAKE 1 TABLET BY MOUTH EVERY DAY     Gastroenterology: Proton Pump Inhibitors Passed - 03/26/2024  8:23 AM      Passed - Valid encounter within last 12 months    Recent Outpatient Visits           1 month ago Diabetes mellitus type 2 in nonobese [facility])   [place] [facility] Celestia Rosaline SQUIBB, NP   1 month ago Anxiety as acute reaction to exceptional stress   [place] [facility] Celestia Rosaline SQUIBB, NP   3 months ago Chronic pain of both shoulders   [place] [facility] Celestia Rosaline SQUIBB, NP   4 months ago Diabetes mellitus type 2 in nonobese [facility])   [employer] [facility] Celestia Rosaline SQUIBB, NP   7 months ago Vaginal itching   [place] [facility] Celestia Rosaline SQUIBB, NP       Future Appointments             In 1 month [place], [place], DO [facility] [employer] [facility] [employer]. [employer], [employer]

## 2024-03-26 NOTE — Telephone Encounter (Signed)
 [name],  This patient was last seen by you on 03/18/24. She is requesting a refill of dicyclomine . Can I refill?

## 2024-03-26 NOTE — Telephone Encounter (Signed)
 [facility] refill request for this patient regards the medication Dicyclomine  10 MG capsules. Please advise

## 2024-03-26 NOTE — Telephone Encounter (Signed)
 Prescription sent to patient's pharmacy.

## 2024-03-27 ENCOUNTER — Encounter (HOSPITAL_COMMUNITY)
Admission: RE | Admit: 2024-03-27 | Discharge: 2024-03-27 | Disposition: A | Discharge status: Home / Self Care | Source: Ambulatory Visit | Attending: Cardiology | Admitting: Cardiology

## 2024-03-27 VITALS — BP 124/64 | HR 97 | Ht 61.0 in | Wt 116.2 lb

## 2024-03-27 DIAGNOSIS — Z951 Presence of aortocoronary bypass graft: Secondary | ICD-10-CM

## 2024-03-27 NOTE — Progress Notes (Signed)
 Discharge Progress Report  Patient Details  Name: [name] MRN: [id number] Date of Birth: December 25, 1955 Referring Provider:   Flowsheet Row INTENSIVE CARDIAC REHAB ORIENT from 11/28/2023 in [facility]  Referring Provider Dr. Aleene Passe MD     Number of Visits: 41  Reason for Discharge:  Patient reached a stable level of exercise. Patient independent in their exercise. Patient has met program and personal goals.  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 Aleene Passe MD    Expected Discharge Date 02/17/24      NuStep   Level 1    SPM 70    Minutes 30    METs 1.8      Prescription Details   Frequency (times per week) 3    Duration Progress to 30 minutes of continuous aerobic without signs/symptoms of physical distress      Intensity   THRR 40-80% of Max Heartrate 60-121    Ratings of Perceived Exertion 11-13    Perceived Dyspnea 0-4      Progression   Progression Continue progressive overload as per policy without signs/symptoms or physical distress.      Resistance Training   Training Prescription Yes    Weight 2    Reps 10-15          Discharge Exercise Prescription (Final Exercise Prescription Changes):  Exercise Prescription Changes - 03/27/24 1036       Response to Exercise   Blood Pressure (Admit) 124/64    Blood Pressure (Exercise) 140/82    Blood Pressure (Exit) 110/76    Heart Rate (Admit) 97 bpm    Heart Rate (Exercise) 136 bpm    Heart Rate (Exit) 104 bpm    Rating of Perceived Exertion (Exercise) 11    Symptoms None    Comments Sherried completed the cardiac rehab  program today.    Duration Continue with 30 min of aerobic exercise without signs/symptoms of physical distress.    Intensity THRR unchanged      Progression   Progression Continue to progress workloads to maintain intensity without signs/symptoms of physical distress.    Average METs 2.9      Resistance Training   Training Prescription Yes    Weight 2 lbs    Reps 10-15    Time 10 Minutes      Interval Training   Interval Training No      NuStep   Level 3    SPM 86    Minutes 15    METs 2.5      Track   Laps 13   1660 feet   Minutes 6   6-minute walk test   METs 3.41      Home Exercise Plan   Plans to continue exercise at [employer] (comment)   Walking, gym at her residence   Frequency Add 2 additional days to program exercise sessions.    Initial Home Exercises Provided 12/20/23          Functional Capacity:  6 Minute Walk     Row Name 11/28/23 1202 03/27/24 1053  6 Minute Walk   Phase Initial Discharge    Distance 890 feet 1660 feet    Distance % Change -- 86.52 %    Distance Feet Change -- 770 ft    Walk Time 6 minutes 6 minutes    # of Rest Breaks 1 0    MPH 1.69 3.14    METS 2.31 4.15    RPE 9 9    Perceived Dyspnea  1 0    VO2 Peak 8.08 14.52    Symptoms Yes (comment) No    Comments standing rest break about 1/2 way about 30 seconds due to SOB and mild dizziness. Both resolved with the break and did not return --    Resting HR 72 bpm 97 bpm    Resting BP 110/66 124/64    Resting Oxygen Saturation  98 % --    Exercise Oxygen Saturation  during 6 min walk 96 % 99 %    Max Ex. HR 90 bpm 134 bpm    Max Ex. BP 120/80 140/82    2 Minute Post BP 110/78 --       Psychological, QOL, Others - Outcomes: PHQ 2/9:    03/27/2024   10:20 AM 03/25/2024    5:18 PM 02/19/2024    9:28 AM 02/06/2024    2:17 PM 12/11/2023    9:08 AM  Depression screen PHQ 2/9  Decreased Interest 0 0 0 0 0  Down, Depressed, Hopeless 0 0 0 0 0  PHQ - 2 Score 0 0 0  0 0  Altered sleeping 0 0 0 0   Tired, decreased energy 0  0 0   Change in appetite 0 0 0 0   Feeling bad or failure about yourself  0 0 0 0   Trouble concentrating 0 0 0 0   Moving slowly or fidgety/restless 0 0 0 0   Suicidal thoughts 0 0 0 0   PHQ-9 Score 0 0 0 0   Difficult doing work/chores    Not difficult at all Not difficult at all    Quality of Life:  Quality of Life - 03/25/24 1719       Quality of Life   Select Quality of Life      Quality of Life Scores   Health/Function Pre 24.92 %    Health/Function Post 24.92 %    Health/Function % Change 0 %    Socioeconomic Pre 25.71 %    Socioeconomic Post 26 %    Socioeconomic % Change  1.13 %    Psych/Spiritual Pre 30 %    Psych/Spiritual Post 23.14 %    Psych/Spiritual % Change -22.87 %    Family Pre 30 %    Family Post 30 %    Family % Change 0 %    GLOBAL Pre 26.9 %    GLOBAL Post 25.4 %    GLOBAL % Change -5.58 %          Personal Goals: Goals established at orientation with interventions provided to work toward goal.  Personal Goals and Risk Factors at Admission - 11/28/23 1222       Core Components/Risk Factors/Patient Goals on Admission   Diabetes Yes    Intervention Provide education about signs/symptoms and action to take for hypo/hyperglycemia.;Provide education about proper nutrition, including hydration, and aerobic/resistive exercise prescription along with prescribed medications to achieve blood glucose in normal ranges: Fasting glucose 65-99 mg/dL    Expected Outcomes Short Term: Participant verbalizes understanding  of the signs/symptoms and immediate care of hyper/hypoglycemia, proper foot care and importance of medication, aerobic/resistive exercise and nutrition plan for blood glucose control.;Long Term: Attainment of HbA1C < 7%.    Heart Failure Yes    Intervention Provide a combined exercise and nutrition program that is supplemented with education, support and counseling about heart failure.  Directed toward relieving symptoms such as shortness of breath, decreased exercise tolerance, and extremity edema.    Expected Outcomes Improve functional capacity of life;Short term: Attendance in program 2-3 days a week with increased exercise capacity. Reported lower sodium intake. Reported increased fruit and vegetable intake. Reports medication compliance.;Short term: Daily weights obtained and reported for increase. Utilizing diuretic protocols set by physician.;Long term: Adoption of self-care skills and reduction of barriers for early signs and symptoms recognition and intervention leading to self-care maintenance.    Hypertension Yes    Intervention Provide education on lifestyle modifcations including regular physical activity/exercise, weight management, moderate sodium restriction and increased consumption of fresh fruit, vegetables, and low fat dairy, alcohol moderation, and smoking cessation.;Monitor prescription use compliance.    Expected Outcomes Short Term: Continued assessment and intervention until BP is < 140/21mm HG in hypertensive participants. < 130/51mm HG in hypertensive participants with diabetes, heart failure or chronic kidney disease.;Long Term: Maintenance of blood pressure at goal levels.    Lipids Yes    Intervention Provide education and support for participant on nutrition & aerobic/resistive exercise along with prescribed medications to achieve LDL 70mg , HDL >40mg .    Expected Outcomes Short Term: Participant states understanding of desired cholesterol values and is compliant with medications prescribed. Participant is following exercise prescription and nutrition guidelines.;Long Term: Cholesterol controlled with medications as prescribed, with individualized exercise RX and with personalized nutrition plan. Value goals: LDL < 70mg , HDL > 40 mg.    Stress Yes    Intervention Offer individual and/or small group education and counseling on adjustment to heart disease,  stress management and health-related lifestyle change. Teach and support self-help strategies.;Refer participants experiencing significant psychosocial distress to appropriate mental health specialists for further evaluation and treatment. When possible, include family members and significant others in education/counseling sessions.    Expected Outcomes Short Term: Participant demonstrates changes in health-related behavior, relaxation and other stress management skills, ability to obtain effective social support, and compliance with psychotropic medications if prescribed.;Long Term: Emotional wellbeing is indicated by absence of clinically significant psychosocial distress or social isolation.    Personal Goal Other Yes    Personal Goal short term: know plan for home exercise. Long term: increase strength and energy    Intervention Will continue to monitor pt and progress WL as tolerated W/O sign or symptom    Expected Outcomes Pt will achieve her goals and increase strength           Personal Goals Discharge:  Goals and Risk Factor Review     Row Name 12/02/23 1728 12/06/23 0932 12/31/23 1444 02/04/24 1434 03/03/24 1358     Core Components/Risk Factors/Patient Goals Review   Personal Goals Review Weight Management/Obesity;Lipids;Hypertension;Stress;Diabetes;Heart Failure Weight Management/Obesity;Lipids;Hypertension;Stress;Diabetes;Heart Failure Weight Management/Obesity;Lipids;Hypertension;Stress;Diabetes;Heart Failure Weight Management/Obesity;Lipids;Hypertension;Stress;Diabetes;Heart Failure Weight Management/Obesity;Lipids;Hypertension;Stress;Diabetes;Heart Failure   Review Madoline started cardiac rehab on 12/02/23. Naketa did well with exercise. Entry systolic BP in the 90's asymptomatic. Exertional and exit BP WNL. Will continue to monitor BP Laruen started cardiac rehab on 12/02/23. Haiven is off to a fair start with exercise. Will continue to monitor BP. It is noted the [facility] Louis NP discontinued Lesleyanne's amlodipine   due to low BP. Tenee has been doing well with exercise at  cardiac rehab. Vital sings have been stable. Stephonie has increased her met level. Apoorva has gained 2.8 kg as weight gain has been a goal for her. Tiffaney continues to  do well with exercise at  cardiac rehab. Vital sings remain stable. Tamecia has increased her met level. Aurie has gained 4.0  kg as weight gain has been a goal for her. Abbygael will complete cardiac rehab on 02/19/24 Fatimata has gained 4.4  kg as weight gain has been a goal for her. Amia's last day of exercise at cardiac rehab was on 02/12/24   Expected Outcomes Meyer will continue to particpate in cardiac rehab for exercise, nutrition and lifestyle modificaiotns Ryonna will continue to particpate in cardiac rehab for exercise, nutrition and lifestyle modificaiotns Myndi will continue to particpate in cardiac rehab for exercise, nutrition and lifestyle modificaiotns Phylis will continue to particpate in cardiac rehab for exercise, nutrition and lifestyle modificaiotns Ashyia will continue to particpate in cardiac rehab for exercise, nutrition and lifestyle modificaiotns    Row Name 03/25/24 1134             Core Components/Risk Factors/Patient Goals Review   Personal Goals Review Weight Management/Obesity;Lipids;Hypertension;Stress;Diabetes;Heart Failure       Review Jozi has gained 4 lbs, since orientation. Weight is a goal for her. She feels her energy has improved since starting the program. She was out recently for a few weeks due to elevated blood pressure. BP is now back within normal limits. She is walking and has access to a gym at her residence. [facility] her [facility] 30 minutes 3 days/ week and resistance exercise there as well.       Expected Outcomes Kelise will continue [facility] at home and at the [facility] upon completion of the cardiac rehab progam.          Exercise  Goals and Review:  Exercise Goals     Row Name 11/28/23 1211             Exercise Goals   Increase Physical Activity Yes       Intervention Provide advice, education, support and counseling about physical activity/exercise needs.;Develop an individualized exercise prescription for aerobic and resistive training based on initial evaluation findings, risk stratification, comorbidities and participant's personal goals.       Expected Outcomes Short Term: Attend rehab on a regular basis to increase amount of physical activity.;Long Term: Exercising regularly at least 3-5 days a week.;Long Term: Add in home exercise to make exercise part of routine and to increase amount of physical activity.       Increase Strength and Stamina Yes       Intervention Provide advice, education, support and counseling about physical activity/exercise needs.;Develop an individualized exercise prescription for aerobic and resistive training based on initial evaluation findings, risk stratification, comorbidities and participant's personal goals.       Expected Outcomes Short Term: Increase workloads from initial exercise prescription for resistance, speed, and METs.;Long Term: Improve cardiorespiratory fitness, muscular endurance and strength as measured by increased METs and functional capacity ( );Short Term: Perform resistance training exercises routinely during rehab and add in resistance training at home       Able to understand and use rate of perceived exertion (RPE) scale Yes       Intervention Provide education and explanation on how to use RPE scale  Expected Outcomes Short Term: Able to use RPE daily in rehab to express subjective intensity level;Long Term:  Able to use RPE to guide intensity level when exercising independently       Knowledge and understanding of Target Heart Rate Range (THRR) Yes       Intervention Provide education and explanation of THRR including how the numbers were predicted and  where they are located for reference       Expected Outcomes Short Term: Able to state/look up THRR;Long Term: Able to use THRR to govern intensity when exercising independently;Short Term: Able to use daily as guideline for intensity in rehab       Understanding of Exercise Prescription Yes       Intervention Provide education, explanation, and written materials on patient's individual exercise prescription       Expected Outcomes Short Term: Able to explain program exercise prescription;Long Term: Able to explain home exercise prescription to exercise independently          Exercise Goals Re-Evaluation:  Exercise Goals Re-Evaluation     Row Name 12/02/23 1136 12/20/23 1112 01/17/24 1051 01/29/24 1100 02/28/24 1711     Exercise Goal Re-Evaluation   Exercise Goals Review Increase Physical Activity;Increase Strength and Stamina;Able to understand and use rate of perceived exertion (RPE) scale Increase Physical Activity;Increase Strength and Stamina;Able to understand and use rate of perceived exertion (RPE) scale;Understanding of Exercise Prescription;Knowledge and understanding of Target Heart Rate Range (THRR) Increase Physical Activity;Increase Strength and Stamina;Able to understand and use rate of perceived exertion (RPE) scale;Understanding of Exercise Prescription;Knowledge and understanding of Target Heart Rate Range (THRR) Increase Physical Activity;Increase Strength and Stamina;Able to understand and use rate of perceived exertion (RPE) scale;Understanding of Exercise Prescription;Knowledge and understanding of Target Heart Rate Range (THRR) Increase Physical Activity;Increase Strength and Stamina;Able to understand and use rate of perceived exertion (RPE) scale;Understanding of Exercise Prescription;Knowledge and understanding of Target Heart Rate Range (THRR)   Comments Nova was able to understand and use RPE scale appropriately. Reviewed exercise prescription with Tammi. She's walking  and using the small gym at her residence. She is looking into joining the [facility]. Darline is making gradual progress with exercise. She is walking 10 minutes in the morning and 20 minutes in the afternoon. She is also doing the warm-up cool-down stretches from CR. She feels she's made a lot of progress in the program. Her energy level has gotten a lot better. She has neuropathy and is still a little off balance because of this. ICR has helped her a lot. Inanna continues to make gradual progress with exercise. Discussed activity progression and how to increase MET level. She will increase her steps/min on the NuStep. She is interested in trying a new piece of equipment. Will try the recumbent bike next session. Remi has been absent since 02/12/24. She was in the ED on 02/16/24 c/o chest pain and hypertension. She's been seen in follow-up and cleared to resume cardiac rehab next week.   Expected Outcomes Progress workloads as tolerated to help improve cardiorespiratory fitness. Ayzia will continue exercise 30 minutes at least 2 days/week in addition to exercise at cardiac rehab to achieve 150 minutes of aerobic exercise per week. Continue to progress workloads as tolerated to help with energy and improve balance. Will add recumbent bike to Emilygrace's exercise prescription. Will review goals upon return to cardiac rehab.    Row Name 03/23/24 1111 03/27/24 1141           Exercise Goal Re-Evaluation  Exercise Goals Review Increase Physical Activity;Increase Strength and Stamina;Able to understand and use rate of perceived exertion (RPE) scale;Understanding of Exercise Prescription;Knowledge and understanding of Target Heart Rate Range (THRR) Increase Physical Activity;Increase Strength and Stamina;Able to understand and use rate of perceived exertion (RPE) scale;Understanding of Exercise Prescription;Knowledge and understanding of Target Heart Rate Range (THRR)      Comments Genevie returned to exercise today  and tolerated well. Blood pressure within normal limits at rest and with exercise. She will complete the cardiac rehab program this week and plans [facility]. She plans to follow her routine here and exercise on the aerobic machines 30 minutes 3 days/week and use the hand weights for her resistance training. Adelayde completed the cardiac rehab program and will continue exercise [facility] the [facility] 30 minutes 3-4 days/week and use the hand weights for her resistance training.      Expected Outcomes Milanna will join the [facility] and continue exercise there upon completion of the cardiac rehab program. [name] will continue exercise at least 30 minutes 3-4 days/week to maintain health and fitness gains.         Nutrition & Weight - Outcomes:  Pre Biometrics - 11/28/23 1200       Pre Biometrics   Height 5' 1 (1.549 m)    Waist Circumference 29.8 inches    Hip Circumference 32.5 inches    Waist to Hip Ratio 0.92 %    Triceps Skinfold 9 mm    % Body Fat 27.3 %    Grip Strength 12 kg    Flexibility 13.5 in    Single Leg Stand 3 seconds          Post Biometrics - 03/27/24 1016        Post  Biometrics   Waist Circumference 34 inches    Hip Circumference 36.5 inches    Waist to Hip Ratio 0.93 %    Triceps Skinfold 9 mm    % Body Fat 32.6 %    Grip Strength 12 kg    Flexibility 12.13 in    Single Leg Stand 1.62 seconds          Nutrition:  Nutrition Therapy & Goals - 01/20/24 1110       Nutrition Therapy   Diet Heart healthy/Carbohydrat consistent diet    Drug/Food Interactions Statins/Certain Fruits      Personal Nutrition Goals   Nutrition Goal Patient to identify strategies for reducing cardiovascular risk by attending the [facility] education and nutrition series weekly.   goal in progress.   Personal Goal #2 Patient to improve diet quality by using the plate method as a guide for meal planning to include lean protein/plant protein, fruits, vegetables,  whole grains, nonfat dairy as part of a well-balanced diet.   goal in progress.   Personal Goal #3 Patient to identify strategies for improving blood sugar with goal <7% A1c.   goal in progress.   Comments Goals in progress. Marijo has medical history of CABGx2, HFrEF, HTN, Hyperlipidemia, TIA, DM2. A1c is not at goal. LDL is not at goal (treated with repatha , zetia , crestor ). She continues to attend the [facility] education/nutrition series regularly. She continues to work on weight gain; we have discussed multiple strategies for weight gain including increasing eating frequency, calorie density, nutrition supplements. She is up 9.7# since starting with our program. She does report her typical adult weight is ~126#. Patient will benefit from participation in intensive cardiac rehab for  nutrition education, exercise, and lifestyle modification.      Intervention Plan   Intervention Prescribe, educate and counsel regarding individualized specific dietary modifications aiming towards targeted core components such as weight, hypertension, lipid management, diabetes, heart failure and other comorbidities.;Nutrition handout(s) given to patient.    Expected Outcomes Short Term Goal: Understand basic principles of dietary content, such as calories, fat, sodium, cholesterol and nutrients.;Long Term Goal: Adherence to prescribed nutrition plan.          Nutrition Discharge:   Education Questionnaire Score:  Knowledge Questionnaire Score - 03/25/24 1717       Knowledge Questionnaire Score   Pre Score 19/24    Post Score 22/24         Pt graduates from  Intensive/Traditional cardiac rehab program today with completion of 48 exercise and education sessions. Pt maintained good attendance and progressed nicely during their participation in rehab as evidenced by increased MET level.   Medication list reconciled. Repeat  PHQ score-0.  Pt has made significant lifestyle changes and should be commended for her  success.  Joshalyn achieved her goals during cardiac rehab.   Pt plans to continue exercise at her residence's gym and by walking.  Goals reviewed with patient; copy given to patient.

## 2024-04-01 ENCOUNTER — Telehealth: Payer: Self-pay | Admitting: Cardiology

## 2024-04-01 NOTE — Telephone Encounter (Signed)
 Left a message to call back.

## 2024-04-01 NOTE — Telephone Encounter (Signed)
  The patient said she received her last cholesterol injection last Sunday. She wants to know when she needs to get her labs done to check her cholesterol. Does she need to wait 7 days after the injection, or can she get them done anytime this week?

## 2024-04-02 NOTE — Telephone Encounter (Signed)
 Spoke with pt and let her know that per her last PharmD appt OV note, labs to be completed in 2 months which would be November. Pt verbalized understanding and had no further questions at this time.

## 2024-04-02 NOTE — Telephone Encounter (Signed)
 Pt returning nurse call

## 2024-04-09 ENCOUNTER — Other Ambulatory Visit (HOSPITAL_COMMUNITY): Payer: Self-pay

## 2024-04-09 ENCOUNTER — Telehealth: Payer: Self-pay | Admitting: Pharmacy Technician

## 2024-04-09 NOTE — Telephone Encounter (Signed)
 Pharmacy Patient Advocate Encounter  Received notification from [facility] that Prior Authorization for repatha  has been APPROVED from 04/09/24 to 10/08/24. Ran test claim, Copay is $47.00- one month. This test claim was processed through [facility]- copay amounts may vary at other pharmacies due to pharmacy/plan contracts, or as the patient moves through the different stages of their insurance plan.   PA #/Case ID/Reference #: [id number]

## 2024-04-09 NOTE — Telephone Encounter (Signed)
 Pharmacy Patient Advocate Encounter   Received notification from CoverMyMeds that prior authorization for Repatha  is required/requested.   Insurance verification completed.   The patient is insured through [employer].   Per test claim: PA required; PA submitted to above mentioned insurance via Latent Key/confirmation #/EOC [id number] Status is pending

## 2024-04-17 ENCOUNTER — Other Ambulatory Visit (HOSPITAL_COMMUNITY): Payer: Self-pay

## 2024-04-17 MED ORDER — REPATHA SURECLICK 140 MG/ML ~~LOC~~ SOAJ
140.0000 mg | SUBCUTANEOUS | 3 refills | Status: AC
  Filled 2024-04-17: qty 6, 84d supply, fill #0
  Filled 2024-07-27: qty 6, 84d supply, fill #1

## 2024-04-17 MED ORDER — POTASSIUM CHLORIDE ER 20 MEQ PO TBCR
20.0000 meq | EXTENDED_RELEASE_TABLET | Freq: Every day | ORAL | 3 refills | Status: AC
  Filled 2024-07-06 – 2024-07-27 (×2): qty 90, 90d supply, fill #0

## 2024-04-17 MED ORDER — PANTOPRAZOLE SODIUM 20 MG PO TBEC: 20.0000 mg | DELAYED_RELEASE_TABLET | Freq: Every day | ORAL | 0 refills | Status: DC

## 2024-04-17 MED ORDER — DAPAGLIFLOZIN PROPANEDIOL 10 MG PO TABS: 10.0000 mg | ORAL_TABLET | Freq: Every day | ORAL | 3 refills | Status: DC

## 2024-04-17 MED ORDER — POTASSIUM CHLORIDE ER 20 MEQ PO TBCR: 20.0000 meq | EXTENDED_RELEASE_TABLET | Freq: Every day | ORAL | 3 refills | Status: DC

## 2024-04-17 MED ORDER — AMLODIPINE BESYLATE 10 MG PO TABS: 10.0000 mg | ORAL_TABLET | Freq: Every day | ORAL | 1 refills | Status: DC

## 2024-04-17 MED ORDER — DAPAGLIFLOZIN PROPANEDIOL 5 MG PO TABS: 5.0000 mg | ORAL_TABLET | Freq: Every day | ORAL | 0 refills | Status: DC

## 2024-04-17 MED ORDER — METOPROLOL TARTRATE 100 MG PO TABS: 100.0000 mg | ORAL_TABLET | Freq: Two times a day (BID) | ORAL | 3 refills | Status: DC

## 2024-04-17 MED ORDER — DICYCLOMINE HCL 10 MG PO CAPS
10.0000 mg | ORAL_CAPSULE | Freq: Three times a day (TID) | ORAL | 1 refills | Status: AC
  Filled 2024-04-17: qty 90, 30d supply, fill #0

## 2024-04-17 MED ORDER — AMLODIPINE BESYLATE 5 MG PO TABS: 5.0000 mg | ORAL_TABLET | Freq: Every day | ORAL | 1 refills | Status: DC

## 2024-04-17 MED ORDER — DAPAGLIFLOZIN PROPANEDIOL 10 MG PO TABS: 10.0000 mg | ORAL_TABLET | Freq: Every day | ORAL | 1 refills | Status: DC

## 2024-04-17 NOTE — Addendum Note (Signed)
 Addended by: Makenley Shimp D on: 04/17/2024 04:34 PM   Modules accepted: Orders

## 2024-04-20 ENCOUNTER — Telehealth (INDEPENDENT_AMBULATORY_CARE_PROVIDER_SITE_OTHER): Payer: Self-pay | Admitting: Primary Care

## 2024-04-20 NOTE — Telephone Encounter (Signed)
Pt will be present.

## 2024-04-21 ENCOUNTER — Ambulatory Visit (INDEPENDENT_AMBULATORY_CARE_PROVIDER_SITE_OTHER): Admitting: Primary Care

## 2024-04-21 ENCOUNTER — Encounter (INDEPENDENT_AMBULATORY_CARE_PROVIDER_SITE_OTHER): Payer: Self-pay | Admitting: Primary Care

## 2024-04-21 VITALS — BP 124/68 | HR 85 | Resp 16 | Wt 117.4 lb

## 2024-04-21 DIAGNOSIS — I10 Essential (primary) hypertension: Secondary | ICD-10-CM | POA: Diagnosis not present

## 2024-04-21 NOTE — Progress Notes (Signed)
 [facility]   [name] is a 68 y.o. female presents for hypertension evaluation, Denies shortness of breath, headaches, chest pain or lower extremity edema, sudden onset, vision changes, unilateral weakness, dizziness, paresthesias   Patient reports adherence with medications.  Dietary habits include: NASh  Exercise habits include:yes Family / Social history: Hear disease    Past Medical History:  Diagnosis Date   Anemia    hx   Aneurysm    small, on left side of brain    Anxiety    hx of   Arthritis    generalized   CAD (coronary artery disease)    CHF (congestive heart failure) (HCC) 2025   Diabetes mellitus without complication (HCC)    diet control, pt denies- on medications   GERD (gastroesophageal reflux disease)    iwht certain foods/on meds/OTC PRN meds   Heart failure with mildly reduced ejection fraction (HFmrEF) (HCC)    Hyperlipidemia    on meds   Hypertension    on meds   TIA (transient ischemic attack)    pt unaware of this hx on 06/19/2017   Past Surgical History:  Procedure Laterality Date   ANTERIOR CERVICAL DECOMP/DISCECTOMY FUSION     BACK SURGERY     CARPAL TUNNEL RELEASE Left 2024   CESAREAN SECTION  1986   CORONARY ARTERY BYPASS GRAFT N/A 07/03/2023   Procedure: CORONARY ARTERY BYPASS GRAFTING X 4, USING LEFT INTERNAL MAMMARY ARTERY AND ENDOSCOPICALLY HARVESTED RIGHT SAPHENOUS VEIN GRAFT;  Surgeon: Kerrin Elspeth BROCKS, MD;  Location: [facility] OR;  Service: Open Heart Surgery;  Laterality: N/A;   FOOT SURGERY Left 11/2019   IR GENERIC HISTORICAL  09/21/2014   IR ANGIO INTRA EXTRACRAN SEL INTERNAL CAROTID UNI L MOD SED 09/21/2014 Gerldine Maizes, MD [facility]   IR GENERIC HISTORICAL  09/21/2014   IR ANGIO INTRA EXTRACRAN SEL COM CAROTID INNOMINATE UNI R MOD SED 09/21/2014 Gerldine Maizes, MD [facility]   IR GENERIC HISTORICAL  09/21/2014   IR ANGIO VERTEBRAL SEL VERTEBRAL UNI L MOD SED 09/21/2014 Gerldine Maizes, MD  [facility]   IR GENERIC HISTORICAL  09/21/2014   IR 3D INDEPENDENT WKST 09/21/2014 Gerldine Maizes, MD [facility]   LAPAROSCOPIC CHOLECYSTECTOMY     LEFT HEART CATH AND CORONARY ANGIOGRAPHY N/A 06/27/2023   Procedure: LEFT HEART CATH AND CORONARY ANGIOGRAPHY;  Surgeon: Anner Alm ORN, MD;  Location: [facility] INVASIVE CV LAB;  Service: Cardiovascular;  Laterality: N/A;   TEE WITHOUT CARDIOVERSION N/A 07/03/2023   Procedure: TRANSESOPHAGEAL ECHOCARDIOGRAM (TEE);  Surgeon: Kerrin Elspeth BROCKS, MD;  Location: [facility] OR;  Service: Open Heart Surgery;  Laterality: N/A;   TUBAL LIGATION     TUBAL LIGATION     No Known Allergies Current Outpatient Medications on File Prior to Visit  Medication Sig Dispense Refill   Accu-Chek Softclix Lancets lancets Use to check blood sugar once daily. 100 each 2   amLODipine  (NORVASC ) 10 MG tablet Take 1 tablet (10 mg total) by mouth daily. 90 tablet 1   amLODipine  (NORVASC ) 5 MG tablet Take 5 mg by mouth daily.     amLODipine  (NORVASC ) 5 MG tablet Take 1 tablet (5 mg total) by mouth daily. 90 tablet 1   aspirin  EC 81 MG tablet Take 1 tablet (81 mg total) by mouth daily.     Blood Glucose Monitoring Suppl (ACCU-CHEK GUIDE ME) w/Device KIT USE TO CHECK BLOOD SUGAR ONCE DAILY 1 kit 0   clopidogrel  (PLAVIX ) 75 MG tablet Take 1 tablet (  75 mg total) by mouth daily. 90 tablet 3   dapagliflozin  propanediol (FARXIGA ) 10 MG TABS tablet Take 1 tablet (10 mg total) by mouth daily before breakfast. 90 tablet 3   dapagliflozin  propanediol (FARXIGA ) 10 MG TABS tablet Take 1 tablet (10 mg total) by mouth daily. 90 tablet 3   dapagliflozin  propanediol (FARXIGA ) 10 MG TABS tablet Take 1 tablet (10 mg total) by mouth daily brefore breakfast. 90 tablet 1   dapagliflozin  propanediol (FARXIGA ) 5 MG TABS tablet Take 1 tablet (5 mg total) by mouth daily. 90 tablet 0   dicyclomine  (BENTYL ) 10 MG capsule Take 1 capsule (10 mg total) by mouth 3 (three) times daily before meals. 90 capsule 1    dicyclomine  (BENTYL ) 10 MG capsule Take 1 capsule (10 mg total) by mouth 3 (three) times daily before meals. 90 capsule 1   DULoxetine  (CYMBALTA ) 30 MG capsule Take 1 capsule (30 mg total) by mouth daily. 180 capsule 1   Evolocumab  (REPATHA  SURECLICK) 140 MG/ML SOAJ Inject 140 mg into the skin every 14 (fourteen) days. 6 mL 3   ezetimibe  (ZETIA ) 10 MG tablet Take 1 tablet (10 mg total) by mouth daily. 90 tablet 3   fluconazole  (DIFLUCAN ) 150 MG tablet Take 1 tablet (150 mg total) by mouth daily. 1 tablet 1   furosemide  (LASIX ) 20 MG tablet Take 1 tablet (20 mg total) by mouth daily as needed. 90 tablet 3   glucose blood (ACCU-CHEK GUIDE TEST) test strip Use to check blood sugar once daily. 100 each 2   hydrOXYzine  (ATARAX ) 25 MG tablet Take 1 tablet (25 mg total) by mouth every 6 (six) hours. 12 tablet 0   JANUVIA  25 MG tablet Take 25 mg by mouth daily.     losartan -hydrochlorothiazide  (HYZAAR ) 100-25 MG tablet Take 1 tablet by mouth daily. 90 tablet 3   metoprolol  tartrate (LOPRESSOR ) 100 MG tablet Take 1 tablet (100 mg total) by mouth in the morning AND 1.5 tablets (150 mg total) every evening. 225 tablet 3   metoprolol  tartrate (LOPRESSOR ) 100 MG tablet Take 1 tablet (100 mg total) by mouth 2 (two) times daily. 180 tablet 3   ondansetron  (ZOFRAN -ODT) 4 MG disintegrating tablet Take 1 tablet (4 mg total) by mouth every 8 (eight) hours as needed. 12 tablet 0   pantoprazole  (PROTONIX ) 20 MG tablet Take 1 tablet (20 mg total) by mouth daily. 60 tablet 0   pantoprazole  (PROTONIX ) 40 MG tablet Take 1 tablet (40 mg total) by mouth daily. 90 tablet 3   Potassium Chloride  ER 20 MEQ TBCR Take 1 tablet (20 mEq total) by mouth daily as needed when taking furosemide  (lasix ). 30 tablet 3   Potassium Chloride  ER 20 MEQ TBCR Take 1 tablet (20 mEq total) by mouth daily as needed when taking furosemide  (lasix ). 90 tablet 3   potassium chloride  SA (KLOR-CON  M) 20 MEQ tablet Take 1 tablet (20 mEq total) by mouth  daily as needed (when taking furosemide  (lasix )). 90 tablet 3   pregabalin (LYRICA) 50 MG capsule Take 50 mg by mouth 3 (three) times daily.     rosuvastatin  (CRESTOR ) 40 MG tablet Take 1 tablet (40 mg total) by mouth daily. 90 tablet 3   No current facility-administered medications on file prior to visit.   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      May 30, 2023 mother passed away a month ago   Social Drivers of Corporate Investment Banker Strain: Low Risk  (May 30, 2023)   Overall Financial Resource Strain (CARDIA)    Difficulty of Paying Living Expenses: Not very hard  Food Insecurity: No Food Insecurity (07/11/2023)   Hunger Vital Sign    Worried About Running Out of Food in the Last Year: Never true    Ran Out of Food in the Last Year: Never true  Transportation Needs: No Transportation Needs (07/11/2023)   PRAPARE - Administrator, Civil Service (Medical): No    Lack of Transportation (Non-Medical): No  Physical Activity: Inactive (05/30/2023)   Exercise Vital Sign    Days of Exercise per Week: 0 days    Minutes of Exercise per Session: 0 min  Stress: Stress Concern Present (2023/05/30)   [employer] of Occupational Health -  Occupational Stress Questionnaire    Feeling of Stress : Very much  Social Connections: Moderately Integrated (06/24/2023)   Social Connection and Isolation Panel    Frequency of Communication with Friends and Family: Once a week    Frequency of Social Gatherings with Friends and Family: More than three times a week    Attends Religious Services: 1 to 4 times per year    Active Member of [employer] or Organizations: Yes    Attends Banker Meetings: Never    Marital Status: Widowed  Recent Concern: Social Connections - Moderately Isolated (2023-05-30)   Social Connection and Isolation Panel    Frequency of Communication with Friends and Family: More than three times a week    Frequency of Social Gatherings with Friends and Family: Once a week    Attends Religious Services: 1 to 4 times per year    Active Member of [employer] or Organizations: No    Attends Banker Meetings: Never    Marital Status: Widowed  Intimate Partner Violence: Not At Risk (07/11/2023)   Humiliation, Afraid, Rape, and Kick questionnaire    Fear of Current or Ex-Partner: No    Emotionally Abused: No    Physically Abused: No    Sexually Abused: No   Family History  Problem Relation Age of Onset   Hypertension Mother    Arthritis Mother    Diabetes Mother    Dementia Mother    Heart attack Father    Heart disease Father    Hypertension Sister    Diabetes Sister    Hypertension Sister    Heart disease Sister    Diabetes Sister    Hypertension Brother    Heart disease Brother    Diabetes Brother    Hypertension Brother    Hypertension Brother    Hypertension Brother  Cancer Neg Hx    Colon cancer Neg Hx    Colon polyps Neg Hx    Esophageal cancer Neg Hx    Rectal cancer Neg Hx    Stomach cancer Neg Hx    Breast cancer Neg Hx    Health Maintenance  Topic Date Due   OPHTHALMOLOGY EXAM  07/25/2018   COVID-19 Vaccine (3 - Pfizer risk series) 03/04/2020   Zoster Vaccines- Shingrix (2  of 2) 06/27/2023   Influenza Vaccine  01/24/2024   Diabetic kidney evaluation - Urine ACR  05/01/2024   Medicare Annual Wellness (AWV)  05/01/2024   FOOT EXAM  06/11/2024   HEMOGLOBIN A1C  08/21/2024   DEXA SCAN  11/12/2024   Mammogram  01/30/2025   Diabetic kidney evaluation - eGFR measurement  02/27/2025   DTaP/Tdap/Td (2 - Td or Tdap) 04/26/2027   Colonoscopy  02/13/2028   Pneumococcal Vaccine: 50+ Years  Completed   Hepatitis C Screening  Completed   Meningococcal B Vaccine  Aged Out     OBJECTIVE:  Vitals:   04/21/24 1001  BP: (!) 146/93  Pulse: 85  Resp: 16  SpO2: 100%  Weight: 117 lb 6.4 oz (53.3 kg)    Physical Exam Vitals reviewed.  Constitutional:      Appearance: Normal appearance.     Comments: Thin frame   HENT:     Head: Normocephalic.     Right Ear: Tympanic membrane, ear canal and external ear normal.     Left Ear: Tympanic membrane, ear canal and external ear normal.     Nose: Nose normal.     Mouth/Throat:     Mouth: Mucous membranes are moist.  Eyes:     Extraocular Movements: Extraocular movements intact.     Pupils: Pupils are equal, round, and reactive to light.  Cardiovascular:     Rate and Rhythm: Normal rate.  Pulmonary:     Effort: Pulmonary effort is normal.     Breath sounds: Normal breath sounds.  Abdominal:     General: Bowel sounds are normal.     Palpations: Abdomen is soft.  Musculoskeletal:        General: Normal range of motion.     Cervical back: Normal range of motion.  Skin:    General: Skin is warm and dry.  Neurological:     Mental Status: She is alert and oriented to person, place, and time.  Psychiatric:        Mood and Affect: Mood normal.        Behavior: Behavior normal.        Thought Content: Thought content normal.      ROS  Last 3 Office BP readings: BP Readings from Last 3 Encounters:  04/21/24 (!) 146/93  03/27/24 124/64  02/27/24 (!) 140/90    BMET    Component Value Date/Time   NA 142  02/28/2024 1447   NA 139 01/24/2024 1116   K 3.6 02/28/2024 1447   CL 102 02/28/2024 1447   CO2 27 02/28/2024 1447   GLUCOSE 139 (H) 02/28/2024 1447   BUN 17 02/28/2024 1447   BUN 23 01/24/2024 1116   CREATININE 0.71 02/28/2024 1447   CREATININE 0.72 02/24/2013 1721   CALCIUM  9.5 02/28/2024 1447   GFRNONAA >60 02/16/2024 0353   GFRAA 109 05/27/2020 1018    Renal function: CrCl cannot be calculated (Patient's most recent lab result is older than the maximum 21 days allowed.).  Clinical ASCVD: Yes  The 10-year ASCVD  risk score (Arnett DK, et al., 2019) is: 37.8%   Values used to calculate the score:     Age: 47 years     Clincally relevant sex: Female     Is Non-Hispanic African American: Yes     Diabetic: Yes     Tobacco smoker: No     Systolic Blood Pressure: 146 mmHg     Is BP treated: Yes     HDL Cholesterol: 76 mg/dL     Total Cholesterol: 252 mg/dL  ASCVD risk factors include- CHAD   ASSESSMENT & PLAN: Diagnoses and all orders for this visit:  Essential hypertension -Counseled on lifestyle modifications for blood pressure control including reduced dietary sodium, increased exercise, weight reduction and adequate sleep. Also, educated patient about the risk for cardiovascular events, stroke and heart attack. Also counseled patient about the importance of medication adherence. If you participate in smoking, it is important to stop using tobacco as this will increase the risks associated with uncontrolled blood pressure.  Goal BP:  For patients younger than 60: Goal BP < 130/80. For patients 60 and older: Goal BP < 140/90. For patients with diabetes: Goal BP < 130/80. Your most recent BP: 146/93  Minimize salt intake. Minimize alcohol intake    This note has been created with Education officer, environmental. Any transcriptional errors are unintentional.   [name], NP 04/21/2024, 10:34 AM

## 2024-04-23 ENCOUNTER — Other Ambulatory Visit (HOSPITAL_COMMUNITY): Payer: Self-pay

## 2024-04-25 ENCOUNTER — Encounter (HOSPITAL_COMMUNITY): Payer: Self-pay

## 2024-04-25 ENCOUNTER — Emergency Department (HOSPITAL_COMMUNITY)
Admission: EM | Admit: 2024-04-25 | Discharge: 2024-04-26 | Disposition: A | Attending: Emergency Medicine | Admitting: Emergency Medicine

## 2024-04-25 DIAGNOSIS — X110XXA Contact with hot water in bath or tub, initial encounter: Secondary | ICD-10-CM | POA: Insufficient documentation

## 2024-04-25 DIAGNOSIS — I509 Heart failure, unspecified: Secondary | ICD-10-CM | POA: Diagnosis not present

## 2024-04-25 DIAGNOSIS — T23001A Burn of unspecified degree of right hand, unspecified site, initial encounter: Secondary | ICD-10-CM | POA: Diagnosis present

## 2024-04-25 DIAGNOSIS — I2510 Atherosclerotic heart disease of native coronary artery without angina pectoris: Secondary | ICD-10-CM | POA: Diagnosis not present

## 2024-04-25 DIAGNOSIS — Z79899 Other long term (current) drug therapy: Secondary | ICD-10-CM | POA: Insufficient documentation

## 2024-04-25 DIAGNOSIS — Z23 Encounter for immunization: Secondary | ICD-10-CM | POA: Insufficient documentation

## 2024-04-25 DIAGNOSIS — Z7982 Long term (current) use of aspirin: Secondary | ICD-10-CM | POA: Diagnosis not present

## 2024-04-25 DIAGNOSIS — E119 Type 2 diabetes mellitus without complications: Secondary | ICD-10-CM | POA: Insufficient documentation

## 2024-04-25 DIAGNOSIS — T23102A Burn of first degree of left hand, unspecified site, initial encounter: Secondary | ICD-10-CM | POA: Insufficient documentation

## 2024-04-25 DIAGNOSIS — I110 Hypertensive heart disease with heart failure: Secondary | ICD-10-CM | POA: Insufficient documentation

## 2024-04-25 MED ORDER — OXYCODONE-ACETAMINOPHEN 5-325 MG PO TABS
1.0000 | ORAL_TABLET | ORAL | Status: DC | PRN
  Administered 2024-04-25: 1 via ORAL
  Filled 2024-04-25 (×2): qty 1

## 2024-04-25 NOTE — ED Triage Notes (Signed)
 Patient states that she fell in the bathtub and burnt her left hand in scalding hot water. Patient reports this just happened and she put ointment on it to see if it helped.

## 2024-04-26 MED ORDER — BACITRACIN ZINC 500 UNIT/GM EX OINT
TOPICAL_OINTMENT | Freq: Two times a day (BID) | CUTANEOUS | Status: DC
  Administered 2024-04-26: 1 via TOPICAL
  Filled 2024-04-26: qty 1.8

## 2024-04-26 MED ORDER — TETANUS-DIPHTH-ACELL PERTUSSIS 5-2-15.5 LF-MCG/0.5 IM SUSP
0.5000 mL | Freq: Once | INTRAMUSCULAR | Status: AC
  Administered 2024-04-26: 0.5 mL via INTRAMUSCULAR
  Filled 2024-04-26: qty 0.5

## 2024-04-26 NOTE — Discharge Instructions (Addendum)
 Please follow-up with your PCP, Rosaline Bohr, for further care of your burn.  Please continue to wrap your wound with supplies provided to you.  Please follow-up with PCP.  Return to ED with new symptoms.

## 2024-04-26 NOTE — ED Provider Notes (Signed)
 [facility] Provider Note   CSN: [id number] Arrival date & time: 04/25/24  2108     Patient presents with: Burn (Right hand/)   [name] is a 68 y.o. female with history of CHF, anemia, aneurysm, anxiety, CAD, diabetes, GERD, heart failure, hypertension. Patient presents to ED for evaluation of burn to right hand. States that she was in the shower when she was attempting to get out and she tripped. States that she reached out and grabbed the water faucet and accidentally turned the water to extremely hot temperature and she burned herself. She is unsure of her last tetanus shot. She has a burn to her right hand. She denies any burns elsewhere. She denies falling or striking her head during this event. Reports pain has decreased after being given oxycodone  in triage.    Burn      Prior to Admission medications   Medication Sig Start Date End Date Taking? Authorizing Provider  Accu-Chek Softclix Lancets lancets Use to check blood sugar once daily. 12/03/23   Newlin, Enobong, MD  amLODipine  (NORVASC ) 10 MG tablet Take 1 tablet (10 mg total) by mouth daily. 05/02/23   Celestia Rosaline SQUIBB, NP  amLODipine  (NORVASC ) 5 MG tablet Take 5 mg by mouth daily. 02/19/24   [provider]  amLODipine  (NORVASC ) 5 MG tablet Take 1 tablet (5 mg total) by mouth daily. 11/01/23   Wyn Jackee VEAR Mickey., NP  aspirin  EC 81 MG tablet Take 1 tablet (81 mg total) by mouth daily. 07/10/23   Barrett, Erin R, PA-C  Blood Glucose Monitoring Suppl (ACCU-CHEK GUIDE ME) w/Device KIT USE TO CHECK BLOOD SUGAR ONCE DAILY 02/04/24   Newlin, Enobong, MD  clopidogrel  (PLAVIX ) 75 MG tablet Take 1 tablet (75 mg total) by mouth daily. 11/01/23 10/31/24  Wyn Jackee VEAR Mickey., NP  dapagliflozin  propanediol (FARXIGA ) 10 MG TABS tablet Take 1 tablet (10 mg total) by mouth daily before breakfast. 03/16/24   Alvstad, Kristin L, RPH-CPP  dapagliflozin  propanediol (FARXIGA ) 10 MG TABS tablet  Take 1 tablet (10 mg total) by mouth daily. 03/13/24   Tolia, Sunit, DO  dapagliflozin  propanediol (FARXIGA ) 10 MG TABS tablet Take 1 tablet (10 mg total) by mouth daily brefore breakfast. 11/08/23   Celestia Rosaline SQUIBB, NP  dapagliflozin  propanediol (FARXIGA ) 5 MG TABS tablet Take 1 tablet (5 mg total) by mouth daily. 07/11/23   Kerrin Elspeth BROCKS, MD  dicyclomine  (BENTYL ) 10 MG capsule Take 1 capsule (10 mg total) by mouth 3 (three) times daily before meals. 03/26/24   Craig Alan SAUNDERS, PA-C  dicyclomine  (BENTYL ) 10 MG capsule Take 1 capsule (10 mg total) by mouth 3 (three) times daily before meals. 03/26/24   Craig Alan SAUNDERS, PA-C  DULoxetine  (CYMBALTA ) 30 MG capsule Take 1 capsule (30 mg total) by mouth daily. 05/02/23   Celestia Rosaline SQUIBB, NP  Evolocumab  (REPATHA  SURECLICK) 140 MG/ML SOAJ Inject 140 mg into the skin every 14 (fourteen) days. 04/17/24   Tolia, Sunit, DO  ezetimibe  (ZETIA ) 10 MG tablet Take 1 tablet (10 mg total) by mouth daily. 11/01/23   Wyn Jackee VEAR Mickey., NP  fluconazole  (DIFLUCAN ) 150 MG tablet Take 1 tablet (150 mg total) by mouth daily. 08/09/23   Celestia Rosaline SQUIBB, NP  furosemide  (LASIX ) 20 MG tablet Take 1 tablet (20 mg total) by mouth daily as needed. 11/01/23   Wyn Jackee VEAR Mickey., NP  glucose blood (ACCU-CHEK GUIDE TEST) test strip Use to check blood  sugar once daily. 12/03/23   Newlin, Enobong, MD  hydrOXYzine  (ATARAX ) 25 MG tablet Take 1 tablet (25 mg total) by mouth every 6 (six) hours. 08/19/23   Dasie Faden, MD  JANUVIA  25 MG tablet Take 25 mg by mouth daily. 09/02/23   [provider]  losartan -hydrochlorothiazide  (HYZAAR ) 100-25 MG tablet Take 1 tablet by mouth daily. 11/01/23   Wyn Jackee VEAR Mickey., NP  metoprolol  tartrate (LOPRESSOR ) 100 MG tablet Take 1 tablet (100 mg total) by mouth in the morning AND 1.5 tablets (150 mg total) every evening. 12/03/23   Wyn Jackee VEAR Mickey., NP  metoprolol  tartrate (LOPRESSOR ) 100 MG tablet Take 1 tablet (100 mg total) by  mouth 2 (two) times daily. 07/24/23   Dunn, Dayna N, PA-C  ondansetron  (ZOFRAN -ODT) 4 MG disintegrating tablet Take 1 tablet (4 mg total) by mouth every 8 (eight) hours as needed. 02/16/24   Griselda Norris, MD  pantoprazole  (PROTONIX ) 20 MG tablet Take 1 tablet (20 mg total) by mouth daily. 12/20/23   Celestia Rosaline SQUIBB, NP  pantoprazole  (PROTONIX ) 40 MG tablet Take 1 tablet (40 mg total) by mouth daily. 02/27/24   Craig Alan SAUNDERS, PA-C  Potassium Chloride  ER 20 MEQ TBCR Take 1 tablet (20 mEq total) by mouth daily as needed when taking furosemide  (lasix ). 07/24/23   Dunn, Dayna N, PA-C  Potassium Chloride  ER 20 MEQ TBCR Take 1 tablet (20 mEq total) by mouth daily as needed when taking furosemide  (lasix ). 11/01/23   Wyn Jackee VEAR Mickey., NP  potassium chloride  SA (KLOR-CON  M) 20 MEQ tablet Take 1 tablet (20 mEq total) by mouth daily as needed (when taking furosemide  (lasix )). 11/01/23   Wyn Jackee VEAR Mickey., NP  pregabalin (LYRICA) 50 MG capsule Take 50 mg by mouth 3 (three) times daily. 04/30/22   [provider]  rosuvastatin  (CRESTOR ) 40 MG tablet Take 1 tablet (40 mg total) by mouth daily. 11/01/23   Wyn Jackee VEAR Mickey., NP    Allergies: Patient has no known allergies.    Review of Systems  Skin:        Burn  All other systems reviewed and are negative.   Updated Vital Signs BP (!) 144/91 (BP Location: Right Arm)   Pulse 87   Temp 98.6 F (37 C)   Resp 16   SpO2 100%   Physical Exam Vitals and nursing note reviewed.  Constitutional:      General: She is not in acute distress.    Appearance: She is well-developed.  HENT:     Head: Normocephalic and atraumatic.  Eyes:     Conjunctiva/sclera: Conjunctivae normal.  Cardiovascular:     Rate and Rhythm: Normal rate and regular rhythm.     Heart sounds: No murmur heard. Pulmonary:     Effort: Pulmonary effort is normal. No respiratory distress.     Breath sounds: Normal breath sounds.  Abdominal:     Palpations: Abdomen is soft.      Tenderness: There is no abdominal tenderness.  Musculoskeletal:        General: No swelling.     Cervical back: Neck supple.  Skin:    General: Skin is warm and dry.     Capillary Refill: Capillary refill takes less than 2 seconds.     Comments: Less than 1% body surface area burn to dorsal surface of left hand.  See photo.  Neurological:     Mental Status: She is alert.  Psychiatric:  Mood and Affect: Mood normal.     (all labs ordered are listed, but only abnormal results are displayed) Labs Reviewed - No data to display  EKG: None  Radiology: No results found.  Procedures   Medications Ordered in the ED  oxyCODONE -acetaminophen  (PERCOCET/ROXICET) 5-325 MG per tablet 1 tablet (1 tablet Oral Given 04/25/24 2139)  bacitracin ointment (1 Application Topical Given 04/26/24 0312)  Tdap (ADACEL) injection 0.5 mL (0.5 mLs Intramuscular Given 04/26/24 0158)    Medical Decision Making Risk OTC drugs. Prescription drug management.   Extremely pleasant 68 year old female presents to the ED due to burn to left hand.  On exam, patient does have a less than 1% body surface area burn to her left dorsal surface of her hand.  Slight sloughing of skin.  Appears to be superficial.  Tetanus updated.  Patient had hand placed under cold water for 20 minutes.  She then had her wound covered with bacitracin ointment and wrapped with gauze.  She was advised to follow-up with her outpatient PCP.  I provided her with supplies to care for her wound at home.  She was advised to follow-up in the next 7 days.  She was given return precautions and she voiced understanding.  Stable to discharge.    Final diagnoses:  Superficial burn of left hand, unspecified site of hand, initial encounter    ED Discharge Orders     None          [name] 04/26/24 0315    Palumbo, April, MD 04/26/24 [phone]

## 2024-05-04 ENCOUNTER — Ambulatory Visit (INDEPENDENT_AMBULATORY_CARE_PROVIDER_SITE_OTHER): Admitting: Primary Care

## 2024-05-04 ENCOUNTER — Encounter (INDEPENDENT_AMBULATORY_CARE_PROVIDER_SITE_OTHER): Payer: Self-pay | Admitting: Primary Care

## 2024-05-04 ENCOUNTER — Encounter (INDEPENDENT_AMBULATORY_CARE_PROVIDER_SITE_OTHER): Payer: Medicare Other

## 2024-05-04 VITALS — BP 140/92 | HR 97 | Resp 16 | Wt 116.2 lb

## 2024-05-04 DIAGNOSIS — R300 Dysuria: Secondary | ICD-10-CM | POA: Diagnosis not present

## 2024-05-04 DIAGNOSIS — F5102 Adjustment insomnia: Secondary | ICD-10-CM | POA: Diagnosis not present

## 2024-05-04 DIAGNOSIS — T300 Burn of unspecified body region, unspecified degree: Secondary | ICD-10-CM

## 2024-05-04 DIAGNOSIS — W19XXXA Unspecified fall, initial encounter: Secondary | ICD-10-CM

## 2024-05-04 DIAGNOSIS — W010XXA Fall on same level from slipping, tripping and stumbling without subsequent striking against object, initial encounter: Secondary | ICD-10-CM | POA: Diagnosis not present

## 2024-05-04 MED ORDER — TRIAMCINOLONE ACETONIDE 0.1 % EX CREA: TOPICAL_CREAM | Freq: Every day | CUTANEOUS | 0 refills | Status: AC

## 2024-05-04 MED ORDER — TRAZODONE HCL 50 MG PO TABS: 25.0000 mg | ORAL_TABLET | Freq: Every evening | ORAL | 1 refills | Status: DC | PRN
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
## 2024-05-04 NOTE — Patient Instructions (Signed)
 Trazodone  Tablets What is this medication? TRAZODONE  (TRAZ oh done) treats depression. It increases the amount of serotonin in the brain, a substance that helps regulate mood. This medicine may be used for other purposes; ask your health care provider or pharmacist if you have questions. COMMON BRAND NAME(S): Desyrel  What should I tell my care team before I take this medication? They need to know if you have any of these conditions: Bipolar disorder Bleeding disorder Glaucoma Heart disease, or previous heart attack Irregular heartbeat or rhythm Kidney disease Liver disease Low levels of sodium in the blood Suicidal thoughts, plans, or attempt by you or a family member An unusual or allergic reaction to trazodone , other medications, foods, dyes, or preservatives Pregnant or trying to get pregnant Breastfeeding How should I use this medication? Take this medication by mouth with a glass of water. Take it as directed on the prescription label at the same time every day. Take this medication shortly after a meal or a light snack. Keep taking this medication unless your care team tells you to stop. Stopping it too quickly can cause serious side effects. It can also make your condition worse. A special MedGuide will be given to you by the pharmacist with each prescription and refill. Be sure to read this information carefully each time. Talk to your care team about the use of this medication in children. Special care may be needed. Overdosage: If you think you have taken too much of this medicine contact a poison control center or emergency room at once. NOTE: This medicine is only for you. Do not share this medicine with others. What if I miss a dose? If you miss a dose, take it as soon as you can. If it is almost time for your next dose, take only that dose. Do not take double or extra doses. What may interact with this medication? Do not take this medication with any of the  following: Certain medications for fungal infections, such as fluconazole, itraconazole, ketoconazole, posaconazole, voriconazole Cisapride Dronedarone Linezolid MAOIs, such as Carbex, Eldepryl, Marplan, Nardil, and Parnate Mesoridazine Methylene blue (injected into a vein) Pimozide Saquinavir Thioridazine This medication may also interact with the following: Alcohol Antiviral medications for HIV or AIDS Aspirin and aspirin-like medications Barbiturates, such as phenobarbital Certain medications for blood pressure, heart disease, irregular heart beat Certain medications for mental health conditions Certain medications for migraine headache, such as almotriptan, eletriptan, frovatriptan, naratriptan, rizatriptan , sumatriptan, zolmitriptan Certain medications for seizures, such as carbamazepine and phenytoin Certain medications for sleep Certain medications that treat or prevent blood clots, such as dalteparin, enoxaparin, warfarin Digoxin Fentanyl  Lithium NSAIDS, medications for pain and inflammation, such as ibuprofen  or naproxen Other medications that cause heart rhythm changes Rasagiline Supplements, such as St. John's wort, kava kava, valerian Tramadol Tryptophan This list may not describe all possible interactions. Give your health care provider a list of all the medicines, herbs, non-prescription drugs, or dietary supplements you use. Also tell them if you smoke, drink alcohol, or use illegal drugs. Some items may interact with your medicine. What should I watch for while using this medication? Visit your care team for regular checks on your progress. Tell your care team if your symptoms do not start to get better or if they get worse. Because it may take several weeks to see the full effects of this medication, it is important to continue your treatment as prescribed by your care team. Watch for new or worsening thoughts of suicide or depression. This  includes sudden changes  in mood, behaviors, or thoughts. These changes can happen at any time but are more common in the beginning of treatment or after a change in dose. Call your care team right away if you experience these thoughts or worsening depression. This medication may cause mood and behavior changes, such as anxiety, nervousness, irritability, hostility, restlessness, excitability, hyperactivity, or trouble sleeping. These changes can happen at any time but are more common in the beginning of treatment or after a change in dose. Call your care team right away if you notice any of these symptoms. This medication may affect your coordination, reaction time, or judgment. Do not drive or operate machinery until you know how this medication affects you. Sit up or stand slowly to reduce the risk of dizzy or fainting spells. Drinking alcohol with this medication can increase the risk of these side effects. This medication may cause dry eyes and blurred vision. If you wear contact lenses you may feel some discomfort. Lubricating drops may help. See your care team if the problem does not go away or is severe. Your mouth may get dry. Chewing sugarless gum or sucking hard candy and drinking plenty of water may help. Contact your care team if the problem does not go away or is severe. What side effects may I notice from receiving this medication? Side effects that you should report to your care team as soon as possible: Allergic reactions--skin rash, itching, hives, swelling of the face, lips, tongue, or throat Bleeding--bloody or black, tar-like stools, red or dark brown urine, vomiting blood or brown material that looks like coffee grounds, small, red or purple spots on skin, unusual bleeding or bruising Heart rhythm changes--fast or irregular heartbeat, dizziness, feeling faint or lightheaded, chest pain, trouble breathing Low blood pressure--dizziness, feeling faint or lightheaded, blurry vision Low sodium level--muscle  weakness, fatigue, dizziness, headache, confusion Prolonged or painful erection Serotonin syndrome--irritability, confusion, fast or irregular heartbeat, muscle stiffness, twitching muscles, sweating, high fever, seizures, chills, vomiting, diarrhea Sudden eye pain or change in vision such as blurry vision, seeing halos around lights, vision loss Thoughts of suicide or self-harm, worsening mood, feelings of depression Side effects that usually do not require medical attention (report to your care team if they continue or are bothersome): Change in sex drive or performance Constipation Dizziness Drowsiness Dry mouth This list may not describe all possible side effects. Call your doctor for medical advice about side effects. You may report side effects to FDA at 1-800-FDA-1088. Where should I keep my medication? Keep out of the reach of children and pets. Store at room temperature between 15 and 30 degrees C (59 to 86 degrees F). Protect from light. Keep container tightly closed. Throw away any unused medication after the expiration date. NOTE: This sheet is a summary. It may not cover all possible information. If you have questions about this medicine, talk to your doctor, pharmacist, or health care provider.  2025 Elsevier/Gold Standard (2023-08-28 00:00:00)

## 2024-05-05 ENCOUNTER — Encounter (INDEPENDENT_AMBULATORY_CARE_PROVIDER_SITE_OTHER): Payer: Self-pay

## 2024-05-05 ENCOUNTER — Ambulatory Visit (INDEPENDENT_AMBULATORY_CARE_PROVIDER_SITE_OTHER)

## 2024-05-05 VITALS — Ht 61.0 in | Wt 116.0 lb

## 2024-05-05 DIAGNOSIS — Z0000 Encounter for general adult medical examination without abnormal findings: Secondary | ICD-10-CM

## 2024-05-05 LAB — MICROSCOPIC EXAMINATION
Bacteria, UA: NONE SEEN
Casts: NONE SEEN /LPF
Epithelial Cells (non renal): NONE SEEN /HPF (ref 0–10)
RBC, Urine: NONE SEEN /HPF (ref 0–2)

## 2024-05-05 LAB — URINALYSIS, COMPLETE
Bilirubin, UA: NEGATIVE
Ketones, UA: NEGATIVE
Nitrite, UA: NEGATIVE
Protein,UA: NEGATIVE
RBC, UA: NEGATIVE
Specific Gravity, UA: >=1.03 — AB (ref 1.005–1.030)
Urobilinogen, Ur: 0.2 mg/dL (ref 0.2–1.0)
pH, UA: 6.5 (ref 5.0–7.5)

## 2024-05-05 NOTE — Progress Notes (Addendum)
 Chief Complaint  Patient presents with   Medicare Wellness     Subjective:   [name] is a 68 y.o. female who presents for a Medicare Annual Wellness Visit.  Allergies (verified) Patient has no known allergies.   History: Past Medical History:  Diagnosis Date   Anemia    hx   Aneurysm    small, on left side of brain    Anxiety    hx of   Arthritis    generalized   CAD (coronary artery disease)    CHF (congestive heart failure) (HCC) 2025   Diabetes mellitus without complication (HCC)    diet control, pt denies- on medications   GERD (gastroesophageal reflux disease)    iwht certain foods/on meds/OTC PRN meds   Heart failure with mildly reduced ejection fraction (HFmrEF) (HCC)    Hyperlipidemia    on meds   Hypertension    on meds   TIA (transient ischemic attack)    pt unaware of this hx on 06/19/2017   Past Surgical History:  Procedure Laterality Date   ANTERIOR CERVICAL DECOMP/DISCECTOMY FUSION     BACK SURGERY     CARPAL TUNNEL RELEASE Left 2024   CESAREAN SECTION  1986   CORONARY ARTERY BYPASS GRAFT N/A 07/03/2023   Procedure: CORONARY ARTERY BYPASS GRAFTING X 4, USING LEFT INTERNAL MAMMARY ARTERY AND ENDOSCOPICALLY HARVESTED RIGHT SAPHENOUS VEIN GRAFT;  Surgeon: Kerrin Elspeth BROCKS, MD;  Location: [facility] OR;  Service: Open Heart Surgery;  Laterality: N/A;   FOOT SURGERY Left 11/2019   IR GENERIC HISTORICAL  09/21/2014   IR ANGIO INTRA EXTRACRAN SEL INTERNAL CAROTID UNI L MOD SED 09/21/2014 Gerldine Maizes, MD [facility]   IR GENERIC HISTORICAL  09/21/2014   IR ANGIO INTRA EXTRACRAN SEL COM CAROTID INNOMINATE UNI R MOD SED 09/21/2014 Gerldine Maizes, MD [facility]   IR GENERIC HISTORICAL  09/21/2014   IR ANGIO VERTEBRAL SEL VERTEBRAL UNI L MOD SED 09/21/2014 Gerldine Maizes, MD [facility]   IR GENERIC HISTORICAL  09/21/2014   IR 3D INDEPENDENT WKST 09/21/2014 Gerldine Maizes, MD [facility]   LAPAROSCOPIC CHOLECYSTECTOMY     LEFT HEART  CATH AND CORONARY ANGIOGRAPHY N/A 06/27/2023   Procedure: LEFT HEART CATH AND CORONARY ANGIOGRAPHY;  Surgeon: Anner Alm ORN, MD;  Location: [facility] INVASIVE CV LAB;  Service: Cardiovascular;  Laterality: N/A;   TEE WITHOUT CARDIOVERSION N/A 07/03/2023   Procedure: TRANSESOPHAGEAL ECHOCARDIOGRAM (TEE);  Surgeon: Kerrin Elspeth BROCKS, MD;  Location: [facility] OR;  Service: Open Heart Surgery;  Laterality: N/A;   TUBAL LIGATION     TUBAL LIGATION     Family History  Problem Relation Age of Onset   Hypertension Mother    Arthritis Mother    Diabetes Mother    Dementia Mother    Heart attack Father    Heart disease Father    Hypertension Sister    Diabetes Sister    Hypertension Sister    Heart disease Sister    Diabetes Sister    Hypertension Brother    Heart disease Brother    Diabetes Brother    Hypertension Brother    Hypertension Brother    Hypertension Brother    Cancer Neg Hx    Colon cancer Neg Hx    Colon polyps Neg Hx    Esophageal cancer Neg Hx    Rectal cancer Neg Hx    Stomach cancer Neg Hx    Breast cancer Neg Hx    Social History  Occupational History   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  Tobacco Use   Smoking status: Former    Types: Cigarettes   Smokeless tobacco: Never  Vaping Use   Vaping status: Never Used  Substance and Sexual Activity   Alcohol use: Not Currently    Comment: socailly   Drug use: Not Currently    Types: Marijuana   Sexual activity: Not on file   Tobacco Counseling Counseling given: Not Answered  SDOH Screenings   Food Insecurity: No Food Insecurity (05/05/2024)  Housing: Low Risk  (05/05/2024)  Transportation Needs: No Transportation Needs (05/05/2024)  Utilities: Not At Risk (05/05/2024)  Alcohol Screen: Low Risk  (05/02/2023)  Depression (PHQ2-9): Low Risk  (05/05/2024)  Financial Resource Strain: Low Risk  (05/02/2023)  Physical Activity: Inactive (05/05/2024)  Social Connections: Moderately  Isolated (05/05/2024)  Stress: Stress Concern Present (05/05/2024)  Tobacco Use: Medium Risk (05/05/2024)  Health Literacy: Adequate Health Literacy (05/05/2024)   Depression Screen    05/05/2024    3:40 PM 05/04/2024   11:21 AM 04/21/2024    9:58 AM 03/27/2024   10:20 AM 03/25/2024    5:18 PM 02/19/2024    9:28 AM 02/06/2024    2:17 PM  PHQ 2/9 Scores  PHQ - 2 Score 1 1 0 0 0 0 0  PHQ- 9 Score 4   0  0  0  0      Data saved with a previous flowsheet row definition     Goals Addressed             This Visit's Progress    Patient Stated   On track    To work on bettering my health       Visit info / Clinical Intake: Medicare Wellness Visit Type:: Subsequent Annual Wellness Visit Persons participating in visit:: patient Medicare Wellness Visit Mode:: Video Because this visit was a virtual/telehealth visit:: vitals recorded from last visit If Telephone or Video please confirm:: I connected with the patient using audio enabled telemedicine application and verified that I am speaking with the correct person using two identifiers; I discussed the limitations of evaluation and management by telemedicine; The patient expressed understanding and agreed to proceed Patient Location:: home Provider Location:: home office Information given by:: patient Interpreter Needed?: No Pre-visit prep was completed: yes AWV questionnaire completed by patient prior to visit?: no Living arrangements:: (!) lives alone Patient's Overall Health Status Rating: good Typical amount of pain: some Does pain affect daily life?: no Are you currently prescribed opioids?: no  Dietary Habits and Nutritional Risks How many meals a day?: 3 Eats fruit and vegetables daily?: yes Most meals are obtained by: preparing own meals Diabetic:: (!) yes Any non-healing wounds?: no How often do you check your BS?: as needed Would you like to be referred to a Nutritionist or for Diabetic Management? :  no  Functional Status Activities of Daily Living (to include ambulation/medication): Independent Ambulation: Independent Medication Administration: Independent Home Management: Independent Manage your own finances?: yes Primary transportation is: driving Concerns about vision?: no *vision screening is required for WTM* Concerns about hearing?: no  Fall Screening Falls in the past year?: 1 Number of falls in past year: 0 Was there an injury with Fall?: 1 Fall Risk Category Calculator: 2 Patient Fall Risk Level: Moderate Fall Risk  Fall Risk Patient at Risk for Falls Due to: History of fall(s) Fall risk Follow up: Falls prevention discussed; Education provided; Falls evaluation  completed  Home and Transportation Safety: All rugs have non-skid backing?: yes All stairs or steps have railings?: yes Grab bars in the bathtub or shower?: yes Have non-skid surface in bathtub or shower?: yes Good home lighting?: yes Regular seat belt use?: yes Hospital stays in the last year:: no  Cognitive Assessment Difficulty concentrating, remembering, or making decisions? : no Will 6CIT or Mini Cog be Completed: yes What year is it?: 0 points What month is it?: 0 points Give patient an address phrase to remember (5 components): [address] [place] About what time is it?: 0 points Count backwards from 20 to 1: 0 points Say the months of the year in reverse: 0 points Repeat the address phrase from earlier: 2 points 6 CIT Score: 2 points  Advance Directives (For Healthcare) Does Patient Have a Medical Advance Directive?: No Would patient like information on creating a medical advance directive?: Yes (MAU/Ambulatory/Procedural Areas - Information given)  Reviewed/Updated  Reviewed/Updated: Reviewed All (Medical, Surgical, Family, Medications, Allergies, Care Teams, Patient Goals)        Objective:    Today's Vitals   05/05/24 1533  Weight: 116 lb (52.6 kg)  Height: 5' 1  (1.549 m)   Body mass index is 21.92 kg/m.  Current Medications (verified) Outpatient Encounter Medications as of 05/05/2024  Medication Sig   Accu-Chek Softclix Lancets lancets Use to check blood sugar once daily.   amLODipine  (NORVASC ) 10 MG tablet Take 1 tablet (10 mg total) by mouth daily.   amLODipine  (NORVASC ) 5 MG tablet Take 5 mg by mouth daily. (Patient not taking: Reported on 05/05/2024)   amLODipine  (NORVASC ) 5 MG tablet Take 1 tablet (5 mg total) by mouth daily.   aspirin  EC 81 MG tablet Take 1 tablet (81 mg total) by mouth daily.   Blood Glucose Monitoring Suppl (ACCU-CHEK GUIDE ME) w/Device KIT USE TO CHECK BLOOD SUGAR ONCE DAILY   clopidogrel  (PLAVIX ) 75 MG tablet Take 1 tablet (75 mg total) by mouth daily.   dapagliflozin  propanediol (FARXIGA ) 10 MG TABS tablet Take 1 tablet (10 mg total) by mouth daily before breakfast.   dapagliflozin  propanediol (FARXIGA ) 10 MG TABS tablet Take 1 tablet (10 mg total) by mouth daily. (Patient not taking: Reported on 05/05/2024)   dapagliflozin  propanediol (FARXIGA ) 10 MG TABS tablet Take 1 tablet (10 mg total) by mouth daily brefore breakfast. (Patient not taking: Reported on 05/05/2024)   dapagliflozin  propanediol (FARXIGA ) 5 MG TABS tablet Take 1 tablet (5 mg total) by mouth daily.   dicyclomine  (BENTYL ) 10 MG capsule Take 1 capsule (10 mg total) by mouth 3 (three) times daily before meals.   dicyclomine  (BENTYL ) 10 MG capsule Take 1 capsule (10 mg total) by mouth 3 (three) times daily before meals.   Evolocumab  (REPATHA  SURECLICK) 140 MG/ML SOAJ Inject 140 mg into the skin every 14 (fourteen) days.   ezetimibe  (ZETIA ) 10 MG tablet Take 1 tablet (10 mg total) by mouth daily.   fluconazole  (DIFLUCAN ) 150 MG tablet Take 1 tablet (150 mg total) by mouth daily.   furosemide  (LASIX ) 20 MG tablet Take 1 tablet (20 mg total) by mouth daily as needed.   glucose blood (ACCU-CHEK GUIDE TEST) test strip Use to check blood sugar once daily.    JANUVIA  25 MG tablet Take 25 mg by mouth daily.   losartan -hydrochlorothiazide  (HYZAAR ) 100-25 MG tablet Take 1 tablet by mouth daily.   metoprolol  tartrate (LOPRESSOR ) 100 MG tablet Take 1 tablet (100 mg total) by mouth in  the morning AND 1.5 tablets (150 mg total) every evening.   metoprolol  tartrate (LOPRESSOR ) 100 MG tablet Take 1 tablet (100 mg total) by mouth 2 (two) times daily.   ondansetron  (ZOFRAN -ODT) 4 MG disintegrating tablet Take 1 tablet (4 mg total) by mouth every 8 (eight) hours as needed.   pantoprazole  (PROTONIX ) 20 MG tablet Take 1 tablet (20 mg total) by mouth daily.   pantoprazole  (PROTONIX ) 40 MG tablet Take 1 tablet (40 mg total) by mouth daily.   Potassium Chloride  ER 20 MEQ TBCR Take 1 tablet (20 mEq total) by mouth daily as needed when taking furosemide  (lasix ).   Potassium Chloride  ER 20 MEQ TBCR Take 1 tablet (20 mEq total) by mouth daily as needed when taking furosemide  (lasix ).   potassium chloride  SA (KLOR-CON  M) 20 MEQ tablet Take 1 tablet (20 mEq total) by mouth daily as needed (when taking furosemide  (lasix )).   pregabalin (LYRICA) 50 MG capsule Take 50 mg by mouth 3 (three) times daily.   rosuvastatin  (CRESTOR ) 40 MG tablet Take 1 tablet (40 mg total) by mouth daily.   traZODone (DESYREL) 50 MG tablet Take 0.5-1 tablets (25-50 mg total) by mouth at bedtime as needed for sleep.   triamcinolone  0.1%-silver sulfadiazine 1:1 cream mixture Apply topically daily.   No facility-administered encounter medications on file as of 05/05/2024.   Hearing/Vision screen Hearing Screening - Comments:: Patient is able to hear conversational tones without difficulty. No issues reported.   Vision Screening - Comments:: No vision problems; will schedule routine eye exam soon   Immunizations and Health Maintenance Health Maintenance  Topic Date Due   OPHTHALMOLOGY EXAM  07/25/2018   COVID-19 Vaccine (3 - Pfizer risk series) 03/04/2020   Zoster Vaccines- Shingrix (2 of 2)  06/27/2023   Influenza Vaccine  01/24/2024   Diabetic kidney evaluation - Urine ACR  05/01/2024   FOOT EXAM  06/11/2024   HEMOGLOBIN A1C  08/21/2024   DEXA SCAN  11/12/2024   Mammogram  01/30/2025   Diabetic kidney evaluation - eGFR measurement  02/27/2025   Medicare Annual Wellness (AWV)  05/05/2025   Colonoscopy  02/13/2028   DTaP/Tdap/Td (3 - Td or Tdap) 04/26/2034   Pneumococcal Vaccine: 50+ Years  Completed   Hepatitis C Screening  Completed   Meningococcal B Vaccine  Aged Out        Assessment/Plan:  This is a routine wellness examination for [name].  Patient Care Team: Celestia Rosaline SQUIBB, NP as PCP - General (Internal Medicine) Michele Richardson, DO as PCP - Cardiology (Cardiology) Legrand Victory LITTIE MOULD, MD as Consulting Physician (Gastroenterology) Waylan Cain, MD as Consulting Physician (Ophthalmology) Thornell Carliss DASEN, MD as Referring Physician (Orthopedic Surgery) Newt Corean DELENA DEVONNA (Physician Assistant) Camella Fallow, MD as Consulting Physician (Orthopedic Surgery) Joya Stabs, DPM as Consulting Physician (Podiatry) Celestia Rosaline SQUIBB, NP as Nurse Practitioner (Internal Medicine) Celestia Rosaline SQUIBB, NP as Nurse Practitioner (Internal Medicine) Gladis Sin, RN (Inactive) as [facility] Management Rana Lum LITTIE, NP as Nurse Practitioner (Cardiology)  I have personally reviewed and noted the following in the patient's chart:   Medical and social history Use of alcohol, tobacco or illicit drugs  Current medications and supplements including opioid prescriptions. Functional ability and status Nutritional status Physical activity Advanced directives List of other physicians Hospitalizations, surgeries, and ER visits in previous 12 months Vitals Screenings to include cognitive, depression, and falls Referrals and appointments  No orders of the defined types were placed in this encounter.  In addition, I  have reviewed and discussed with  patient certain preventive protocols, quality metrics, and best practice recommendations. A written personalized care plan for preventive services as well as general preventive health recommendations were provided to patient.   Lavelle Charmaine Browner, LPN   [id number]/[id number]   Return in 1 year (on 05/05/2025).  After Visit Summary: (MyChart) Due to this being a telephonic visit, the after visit summary with patients personalized plan was offered to patient via MyChart   Nurse Notes: Patient is asking about results of recent urine test done in office.

## 2024-05-05 NOTE — Patient Instructions (Signed)
 [name],  Thank you for taking the time for your Medicare Wellness Visit. I appreciate your continued commitment to your health goals. Please review the care plan we discussed, and feel free to reach out if I can assist you further.  Please note that Annual Wellness Visits do not include a physical exam. Some assessments may be limited, especially if the visit was conducted virtually. If needed, we may recommend an in-person follow-up with your provider.  Ongoing Care Seeing your primary care provider every 3 to 6 months helps us  monitor your health and provide consistent, personalized care.   Referrals If a referral was made during today's visit and you haven't received any updates within two weeks, please contact the referred provider directly to check on the status.  Recommended Screenings:  Health Maintenance  Topic Date Due   Eye exam for diabetics  07/25/2018   COVID-19 Vaccine (3 - Pfizer risk series) 03/04/2020   Zoster (Shingles) Vaccine (2 of 2) 06/27/2023   Flu Shot  01/24/2024   Yearly kidney health urinalysis for diabetes  05/01/2024   Complete foot exam   06/11/2024   Hemoglobin A1C  08/21/2024   DEXA scan (bone density measurement)  11/12/2024   Breast Cancer Screening  01/30/2025   Yearly kidney function blood test for diabetes  02/27/2025   Medicare Annual Wellness Visit  05/05/2025   Colon Cancer Screening  02/13/2028   DTaP/Tdap/Td vaccine (3 - Td or Tdap) 04/26/2034   Pneumococcal Vaccine for age over 32  Completed   Hepatitis C Screening  Completed   Meningitis B Vaccine  Aged Out       05/05/2024    3:35 PM  Advanced Directives  Does Patient Have a Medical Advance Directive? No  Would patient like information on creating a medical advance directive? Yes (MAU/Ambulatory/Procedural Areas - Information given)   Information on Advanced Care Planning can be found at   Secretary of [facility] Advance Health Care Directives Advance Health Care  Directives ([email or web address]    Vision: Annual vision screenings are recommended for early detection of glaucoma, cataracts, and diabetic retinopathy. These exams can also reveal signs of chronic conditions such as diabetes and high blood pressure.  Dental: Annual dental screenings help detect early signs of oral cancer, gum disease, and other conditions linked to overall health, including heart disease and diabetes.  Please see the attached documents for additional preventive care recommendations.

## 2024-05-07 ENCOUNTER — Ambulatory Visit (INDEPENDENT_AMBULATORY_CARE_PROVIDER_SITE_OTHER): Payer: Self-pay | Admitting: Primary Care

## 2024-05-11 ENCOUNTER — Other Ambulatory Visit (INDEPENDENT_AMBULATORY_CARE_PROVIDER_SITE_OTHER)

## 2024-05-11 ENCOUNTER — Encounter: Payer: Self-pay | Admitting: Physician Assistant

## 2024-05-11 ENCOUNTER — Ambulatory Visit: Admitting: Physician Assistant

## 2024-05-11 VITALS — BP 118/74 | HR 96 | Ht 61.0 in | Wt 117.5 lb

## 2024-05-11 DIAGNOSIS — R1013 Epigastric pain: Secondary | ICD-10-CM

## 2024-05-11 DIAGNOSIS — D509 Iron deficiency anemia, unspecified: Secondary | ICD-10-CM

## 2024-05-11 DIAGNOSIS — Z860101 Personal history of adenomatous and serrated colon polyps: Secondary | ICD-10-CM

## 2024-05-11 DIAGNOSIS — R112 Nausea with vomiting, unspecified: Secondary | ICD-10-CM

## 2024-05-11 DIAGNOSIS — I25810 Atherosclerosis of coronary artery bypass graft(s) without angina pectoris: Secondary | ICD-10-CM

## 2024-05-11 DIAGNOSIS — K760 Fatty (change of) liver, not elsewhere classified: Secondary | ICD-10-CM

## 2024-05-11 DIAGNOSIS — K5730 Diverticulosis of large intestine without perforation or abscess without bleeding: Secondary | ICD-10-CM

## 2024-05-11 DIAGNOSIS — I5020 Unspecified systolic (congestive) heart failure: Secondary | ICD-10-CM

## 2024-05-11 LAB — CBC WITH DIFFERENTIAL/PLATELET
Basophils Absolute: 0.1 K/uL (ref 0.0–0.1)
Basophils Relative: 1.2 % (ref 0.0–3.0)
Eosinophils Absolute: 0.1 K/uL (ref 0.0–0.7)
Eosinophils Relative: 1 % (ref 0.0–5.0)
HCT: 35.6 % — ABNORMAL LOW (ref 36.0–46.0)
Hemoglobin: 11.8 g/dL — ABNORMAL LOW (ref 12.0–15.0)
Lymphocytes Relative: 22.9 % (ref 12.0–46.0)
Lymphs Abs: 2.4 K/uL (ref 0.7–4.0)
MCHC: 33.1 g/dL (ref 30.0–36.0)
MCV: 81.9 fl (ref 78.0–100.0)
Monocytes Absolute: 0.6 K/uL (ref 0.1–1.0)
Monocytes Relative: 5.5 % (ref 3.0–12.0)
Neutro Abs: 7.2 K/uL (ref 1.4–7.7)
Neutrophils Relative %: 69.4 % (ref 43.0–77.0)
Platelets: 358 K/uL (ref 150.0–400.0)
RBC: 4.34 Mil/uL (ref 3.87–5.11)
RDW: 13.5 % (ref 11.5–15.5)
WBC: 10.4 K/uL (ref 4.0–10.5)

## 2024-05-11 NOTE — Progress Notes (Unsigned)
 05/12/2024 [name] [id number] Oct 24, 1955  Referring provider: Celestia Rosaline SQUIBB, NP Primary GI doctor: Dr. San (Dr.Stark)  ASSESSMENT AND PLAN:  GERD with intermittent nausea and vomiting improved on pantoprazole  10/27/2020 EGD for nausea and vomiting normal esophagus, erythematous mucosa in the stomach, small hiatal hernia, normal duodenum.  Non-H. pylori mild chronic gastritis otherwise unremarkable 12/03/2022 CTAP W no acute pathology mild hepatic steatosis diverticulosis without inflammation 03/04/2024 GES unremarkable No melena, dysphagia, alarm symptoms Improved on the pantoprazole  40 mg daily -  IDA 02/28/2024  HGB 12.0 MCV 86.0 Platelets 371.0 02/28/2024 Iron 42 Ferritin 19.1  03/16/2024 Hemoccult cards negative Recent Labs    07/07/23 0312 07/25/23 0749 08/19/23 0942 10/05/23 2228 10/08/23 1617 11/01/23 1715 12/13/23 1419 12/25/23 1029 02/16/24 0353 02/28/24 1447  HGB 9.5* 11.6 11.6* 13.4 13.9 13.3 12.5 11.9* 11.6* 12.0  She is not on an iron at this time Denies overt GI bleeding Negative hemoccult 03/16/2024 ? From previous bypass in Jan, reassuring negative Hemoccult in the office - check CBC, CMET, iron ferritin -If patient continues to have iron deficiency will plan for EGD 1 year post bypass after January 8 with cardiac clearance to hold Plavix  5 days. - Continue on pantoprazole  40 mg daily  Personal history of adenomatous polyps 02/13/2023 colonoscopy Dr. Aneita 2-day prep diverticulosis right colon moderate diverticulosis left colon 6 mm polyp mid rectum recall 5 years  Diverticulosis Will call if any symptoms. Add on fiber supplement, avoid NSAIDS, information given  Fatty liver seen on CT 12/03/2022 Normal LFTs, remote isolated elevation of alkaline phosphatase following CABG January 2025    Latest Ref Rng & Units 02/28/2024    2:47 PM 01/24/2024   11:16 AM 12/25/2023   10:29 AM  Hepatic Function  Total Protein 6.0 - 8.3 g/dL 7.7  7.7   7.3   Albumin  3.5 - 5.2 g/dL 4.3  4.6  3.7   AST 0 - 37 U/L 13  23  33   ALT 0 - 35 U/L 8  20  33   Alk Phosphatase 39 - 117 U/L 91  117  77   Total Bilirubin 0.2 - 1.2 mg/dL 0.3  0.5  0.9    Platelets 344  Likely secondary to CABG  CAD Multivessel proximal LAD, proximal RI, proximal left circumflex and distal small OM 2 mid RCA 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 She is on plavix  daily  HFrHF 07/03/2023 intraoperative TEE ejection fraction 45-50% no aortic stenosis  Patient Care Team: Celestia Rosaline SQUIBB, NP as PCP - General (Internal Medicine) Michele Richardson, DO as PCP - Cardiology (Cardiology) Legrand Victory LITTIE MOULD, MD as Consulting Physician (Gastroenterology) Waylan Cain, MD as Consulting Physician (Ophthalmology) Thornell Carliss DASEN, MD as Referring Physician (Orthopedic Surgery) Newt Corean DELENA DEVONNA (Physician Assistant) Camella Fallow, MD as Consulting Physician (Orthopedic Surgery) Joya Stabs, DPM as Consulting Physician (Podiatry) Celestia Rosaline SQUIBB, NP as Nurse Practitioner (Internal Medicine) Celestia Rosaline SQUIBB, NP as Nurse Practitioner (Internal Medicine) Gladis Sin, RN (Inactive) as VBCI Care Management Rana Lum LITTIE, NP as Nurse Practitioner (Cardiology)  HISTORY OF PRESENT ILLNESS: 68 y.o. female with a past medical history listed below presents for evaluation of upper AB pain and gas.   I last saw the patient in the office 02/27/2024 for intermittent nausea vomiting and GERD.  Patient restarted on pantoprazole  40 mg daily given 5 days Foquest nausea.  Patient with 8 months out from bypass on Plavix  (July 03, 2023)  Patient returns to the  office after being on the pantoprazole  40 mg daily with significant improvement of her symptoms she is no longer having upper abdominal pain no reflux and nausea vomiting have improved.  Continues to have minor nausea prior to eating but significantly improved on pantoprazole  daily. Patient was also having  some intermittent upper chest epigastric discomfort which is resolved with pantoprazole  but thought it could have also been musculoskeletal from bypass. Patient denies melena, weight loss, dysphagia, hematochezia, change in bowels. She is still doing cardiac rehab and is transferred this to the [facility] working out 3 days a week denies shortness of breath chest pain or dizziness. She has not started on iron supplementation.  She  reports that she has quit smoking. Her smoking use included cigarettes. She has never used smokeless tobacco. She reports that she does not currently use alcohol. She reports that she does not currently use drugs after having used the following drugs: Marijuana.  RELEVANT GI HISTORY, IMAGING AND LABS: Results          CBC    Component Value Date/Time   WBC 9.2 02/28/2024 1447   RBC 4.36 02/28/2024 1447   HGB 12.0 02/28/2024 1447   HGB 13.3 11/01/2023 1715   HCT 37.5 02/28/2024 1447   HCT 40.3 11/01/2023 1715   PLT 371.0 02/28/2024 1447   PLT 406 11/01/2023 1715   MCV 86.0 02/28/2024 1447   MCV 83 11/01/2023 1715   MCH 27.7 02/16/2024 0353   MCHC 31.9 02/28/2024 1447   RDW 15.1 02/28/2024 1447   RDW 13.1 11/01/2023 1715   LYMPHSABS 2.7 02/28/2024 1447   LYMPHSABS 2.2 05/03/2023 0848   MONOABS 0.5 02/28/2024 1447   EOSABS 0.2 02/28/2024 1447   EOSABS 0.1 05/03/2023 0848   BASOSABS 0.1 02/28/2024 1447   BASOSABS 0.1 05/03/2023 0848   Recent Labs    07/07/23 0312 07/25/23 0749 08/19/23 0942 10/05/23 2228 10/08/23 1617 11/01/23 1715 12/13/23 1419 12/25/23 1029 02/16/24 0353 02/28/24 1447  HGB 9.5* 11.6 11.6* 13.4 13.9 13.3 12.5 11.9* 11.6* 12.0    CMP     Component Value Date/Time   NA 142 02/28/2024 1447   NA 139 01/24/2024 1116   K 3.6 02/28/2024 1447   CL 102 02/28/2024 1447   CO2 27 02/28/2024 1447   GLUCOSE 139 (H) 02/28/2024 1447   BUN 17 02/28/2024 1447   BUN 23 01/24/2024 1116   CREATININE 0.71 02/28/2024 1447   CREATININE 0.72  02/24/2013 1721   CALCIUM  9.5 02/28/2024 1447   PROT 7.7 02/28/2024 1447   PROT 7.7 01/24/2024 1116   ALBUMIN  4.3 02/28/2024 1447   ALBUMIN  4.6 01/24/2024 1116   AST 13 02/28/2024 1447   ALT 8 02/28/2024 1447   ALKPHOS 91 02/28/2024 1447   BILITOT 0.3 02/28/2024 1447   BILITOT 0.5 01/24/2024 1116   GFRNONAA >60 02/16/2024 0353   GFRAA 109 05/27/2020 1018      Latest Ref Rng & Units 02/28/2024    2:47 PM 01/24/2024   11:16 AM 12/25/2023   10:29 AM  Hepatic Function  Total Protein 6.0 - 8.3 g/dL 7.7  7.7  7.3   Albumin  3.5 - 5.2 g/dL 4.3  4.6  3.7   AST 0 - 37 U/L 13  23  33   ALT 0 - 35 U/L 8  20  33   Alk Phosphatase 39 - 117 U/L 91  117  77   Total Bilirubin 0.2 - 1.2 mg/dL 0.3  0.5  0.9  Current Medications:   Current Outpatient Medications (Endocrine & Metabolic):    dapagliflozin  propanediol (FARXIGA ) 10 MG TABS tablet, Take 1 tablet (10 mg total) by mouth daily before breakfast.   JANUVIA  25 MG tablet, Take 25 mg by mouth daily.  Current Outpatient Medications (Cardiovascular):    amLODipine  (NORVASC ) 5 MG tablet, Take 1 tablet (5 mg total) by mouth daily.   Evolocumab  (REPATHA  SURECLICK) 140 MG/ML SOAJ, Inject 140 mg into the skin every 14 (fourteen) days.   ezetimibe  (ZETIA ) 10 MG tablet, Take 1 tablet (10 mg total) by mouth daily.   furosemide  (LASIX ) 20 MG tablet, Take 1 tablet (20 mg total) by mouth daily as needed.   losartan -hydrochlorothiazide  (HYZAAR ) 100-25 MG tablet, Take 1 tablet by mouth daily.   metoprolol  tartrate (LOPRESSOR ) 100 MG tablet, Take 1 tablet (100 mg total) by mouth in the morning AND 1.5 tablets (150 mg total) every evening.   rosuvastatin  (CRESTOR ) 40 MG tablet, Take 1 tablet (40 mg total) by mouth daily.   Current Outpatient Medications (Analgesics):    aspirin  EC 81 MG tablet, Take 1 tablet (81 mg total) by mouth daily.  Current Outpatient Medications (Hematological):    clopidogrel  (PLAVIX ) 75 MG tablet, Take 1 tablet (75 mg total)  by mouth daily.  Current Outpatient Medications (Other):    Accu-Chek Softclix Lancets lancets, Use to check blood sugar once daily.   Blood Glucose Monitoring Suppl (ACCU-CHEK GUIDE ME) w/Device KIT, USE TO CHECK BLOOD SUGAR ONCE DAILY   dicyclomine  (BENTYL ) 10 MG capsule, Take 1 capsule (10 mg total) by mouth 3 (three) times daily before meals.   fluconazole  (DIFLUCAN ) 150 MG tablet, Take 1 tablet (150 mg total) by mouth daily.   glucose blood (ACCU-CHEK GUIDE TEST) test strip, Use to check blood sugar once daily.   ondansetron  (ZOFRAN -ODT) 4 MG disintegrating tablet, Take 1 tablet (4 mg total) by mouth every 8 (eight) hours as needed.   pantoprazole  (PROTONIX ) 40 MG tablet, Take 1 tablet (40 mg total) by mouth daily. (Patient taking differently: Take 40 mg by mouth daily as needed.)   Potassium Chloride  ER 20 MEQ TBCR, Take 1 tablet (20 mEq total) by mouth daily as needed when taking furosemide  (lasix ).   potassium chloride  SA (KLOR-CON  M) 20 MEQ tablet, Take 1 tablet (20 mEq total) by mouth daily as needed (when taking furosemide  (lasix )).   pregabalin (LYRICA) 50 MG capsule, Take 50 mg by mouth 3 (three) times daily.   traZODone (DESYREL) 50 MG tablet, Take 0.5-1 tablets (25-50 mg total) by mouth at bedtime as needed for sleep.   triamcinolone  0.1%-silver sulfadiazine 1:1 cream mixture, Apply topically daily.  Medical History:  Past Medical History:  Diagnosis Date   Anemia    hx   Aneurysm    small, on left side of brain    Anxiety    hx of   Arthritis    generalized   CAD (coronary artery disease)    CHF (congestive heart failure) (HCC) 2025   Diabetes mellitus without complication (HCC)    diet control, pt denies- on medications   GERD (gastroesophageal reflux disease)    iwht certain foods/on meds/OTC PRN meds   Heart failure with mildly reduced ejection fraction (HFmrEF) (HCC)    Hyperlipidemia    on meds   Hypertension    on meds   TIA (transient ischemic attack)     pt unaware of this hx on 06/19/2017   Allergies: No Known Allergies   Surgical History:  She  has a past surgical history that includes Cesarean section (1986); ir generic historical (09/21/2014); ir generic historical (09/21/2014); ir generic historical (09/21/2014); ir generic historical (09/21/2014); Laparoscopic cholecystectomy; Back surgery; Anterior cervical decomp/discectomy fusion; Tubal ligation; Foot surgery (Left, 11/2019); Carpal tunnel release (Left, 2024); Tubal ligation; LEFT HEART CATH AND CORONARY ANGIOGRAPHY (N/A, 06/27/2023); Coronary artery bypass graft (N/A, 07/03/2023); and TEE without cardioversion (N/A, 07/03/2023). Family History:  Her family history includes Arthritis in her mother; Dementia in her mother; Diabetes in her brother, mother, sister, and sister; Heart attack in her father; Heart disease in her brother, father, and sister; Hypertension in her brother, brother, brother, brother, mother, sister, and sister.  REVIEW OF SYSTEMS  : All other systems reviewed and negative except where noted in the History of Present Illness.  PHYSICAL EXAM: BP 118/74   Pulse 96   Ht 5' 1 (1.549 m)   Wt 117 lb 8 oz (53.3 kg)   BMI 22.20 kg/m  General Appearance: Well nourished, in no apparent distress. Respiratory: Respiratory effort normal, BS equal bilaterally without rales, rhonchi, wheezing. Cardio: RRR with no MRGs. Abdomen: Soft,  Non-distended ,active bowel sounds. No tenderness . SABRA No masses. Rectal: Normal external rectal exam, normal rectal tone, no internal hemorrhoids appreciated, no masses, non tender, brown stool, hemoccult Negative Musculoskeletal: Full ROM, Normal gait Neuro: Alert and  oriented x4;  No focal deficits. Psych:  Cooperative. Normal mood and affect.    Alan JONELLE Coombs, PA-C 7:44 AM

## 2024-05-11 NOTE — Patient Instructions (Addendum)
 Your provider has requested that you go to the basement level for lab work before leaving today. Press B on the elevator. The lab is located at the first door on the left as you exit the elevator.  Advised to go to the ER if there is any severe weakness, severe abdominal pain, vomit blood, dark red blood in your bowel movement, shortness of breath or chest pain.   Avoid spicy and acidic foods Avoid fatty foods Limit your intake of coffee, tea, alcohol, and carbonated drinks Work to maintain a healthy weight Keep the head of the bed elevated at least 3 inches with blocks or a wedge pillow if you are having any nighttime symptoms Stay upright for 2 hours after eating Avoid meals and snacks three to four hours before bedtime  Metabolic dysfunction associated seatohepatitis  Now the leading cause of liver failure in the [place] .  It is normally from such risk factors as obesity, diabetes, insulin  resistance, high cholesterol, or metabolic syndrome.  The only definitive therapy is weight loss and exercise.   Suggest walking 20-30 mins daily.  Decreasing carbohydrates, increasing veggies.    Fatty Liver Fatty liver is the accumulation of fat in liver cells. It is also called hepatosteatosis or steatohepatitis. It is normal for your liver to contain some fat. If fat is more than 5 to 10% of your liver's weight, you have fatty liver.  There are often no symptoms (problems) for years while damage is still occurring. People often learn about their fatty liver when they have medical tests for other reasons. Fat can damage your liver for years or even decades without causing problems. When it becomes severe, it can cause fatigue, weight loss, weakness, and confusion. This makes you more likely to develop more serious liver problems. The liver is the largest organ in the body. It does a lot of work and often gives no warning signs when it is sick until late in a disease. The liver has many  important jobs including: Breaking down foods. Storing vitamins, iron, and other minerals. Making proteins. Making bile for food digestion. Breaking down many products including medications, alcohol and some poisons.  PROGNOSIS  Fatty liver may cause no damage or it can lead to an inflammation of the liver. This is, called steatohepatitis.  Over time the liver may become scarred and hardened. This condition is called cirrhosis. Cirrhosis is serious and may lead to liver failure or cancer. NASH is one of the leading causes of cirrhosis. About 10-20% of Americans have fatty liver and a smaller 2-5% has NASH.  TREATMENT  Weight loss, fat restriction, and exercise in overweight patients produces inconsistent results but is worth trying. Good control of diabetes may reduce fatty liver. Eat a balanced, healthy diet. Increase your physical activity. There are no medical or surgical treatments for a fatty liver or NASH, but improving your diet and increasing your exercise may help prevent or reverse some of the damage.  Thank you for choosing me and [facility].  Alan Coombs, PA-C

## 2024-05-12 ENCOUNTER — Other Ambulatory Visit (INDEPENDENT_AMBULATORY_CARE_PROVIDER_SITE_OTHER): Payer: Self-pay | Admitting: Primary Care

## 2024-05-12 ENCOUNTER — Ambulatory Visit: Payer: Self-pay | Admitting: Physician Assistant

## 2024-05-12 LAB — COMPREHENSIVE METABOLIC PANEL WITH GFR
ALT: 10 U/L (ref 0–35)
AST: 12 U/L (ref 0–37)
Albumin: 4.1 g/dL (ref 3.5–5.2)
Alkaline Phosphatase: 97 U/L (ref 39–117)
BUN: 15 mg/dL (ref 6–23)
CO2: 26 meq/L (ref 19–32)
Calcium: 9.3 mg/dL (ref 8.4–10.5)
Chloride: 101 meq/L (ref 96–112)
Creatinine, Ser: 0.81 mg/dL (ref 0.40–1.20)
GFR: 73.95 mL/min (ref >60.00)
Glucose, Bld: 143 mg/dL — ABNORMAL HIGH (ref 70–99)
Potassium: 2.9 meq/L — ABNORMAL LOW (ref 3.5–5.1)
Sodium: 141 meq/L (ref 135–145)
Total Bilirubin: 0.3 mg/dL (ref 0.2–1.2)
Total Protein: 7.2 g/dL (ref 6.0–8.3)

## 2024-05-12 LAB — IBC + FERRITIN
Ferritin: 9.3 ng/mL — ABNORMAL LOW (ref 10.0–291.0)
Iron: 48 ug/dL (ref 42–145)
Saturation Ratios: 11.5 % — ABNORMAL LOW (ref 20.0–50.0)
TIBC: 418.6 ug/dL (ref 250.0–450.0)
Transferrin: 299 mg/dL (ref 212.0–360.0)

## 2024-05-13 ENCOUNTER — Encounter: Payer: Self-pay | Admitting: Cardiology

## 2024-05-13 ENCOUNTER — Other Ambulatory Visit: Payer: Self-pay | Admitting: Pharmacist

## 2024-05-13 ENCOUNTER — Ambulatory Visit: Attending: Cardiology | Admitting: Cardiology

## 2024-05-13 ENCOUNTER — Other Ambulatory Visit (HOSPITAL_COMMUNITY): Payer: Self-pay

## 2024-05-13 VITALS — BP 112/86 | HR 90 | Ht 61.0 in | Wt 113.4 lb

## 2024-05-13 DIAGNOSIS — Z951 Presence of aortocoronary bypass graft: Secondary | ICD-10-CM | POA: Diagnosis not present

## 2024-05-13 DIAGNOSIS — I2510 Atherosclerotic heart disease of native coronary artery without angina pectoris: Secondary | ICD-10-CM

## 2024-05-13 DIAGNOSIS — I5022 Chronic systolic (congestive) heart failure: Secondary | ICD-10-CM

## 2024-05-13 DIAGNOSIS — Z760 Encounter for issue of repeat prescription: Secondary | ICD-10-CM

## 2024-05-13 DIAGNOSIS — I10 Essential (primary) hypertension: Secondary | ICD-10-CM

## 2024-05-13 DIAGNOSIS — E782 Mixed hyperlipidemia: Secondary | ICD-10-CM

## 2024-05-13 MED ORDER — EZETIMIBE 10 MG PO TABS
10.0000 mg | ORAL_TABLET | Freq: Every day | ORAL | 3 refills | Status: AC
  Filled 2024-05-13: qty 90, 90d supply, fill #0

## 2024-05-13 MED ORDER — CLOPIDOGREL BISULFATE 75 MG PO TABS
75.0000 mg | ORAL_TABLET | Freq: Every day | ORAL | 3 refills | Status: AC
  Filled 2024-05-13: qty 90, 90d supply, fill #0
  Filled 2024-07-27: qty 90, 90d supply, fill #1

## 2024-05-13 MED ORDER — DAPAGLIFLOZIN PROPANEDIOL 10 MG PO TABS
10.0000 mg | ORAL_TABLET | Freq: Every day | ORAL | 3 refills | Status: AC
  Filled 2024-05-13 – 2024-07-06 (×2): qty 90, 90d supply, fill #0

## 2024-05-13 MED ORDER — METOPROLOL TARTRATE 100 MG PO TABS
ORAL_TABLET | ORAL | 3 refills | Status: AC
  Filled 2024-05-13: qty 225, 90d supply, fill #0

## 2024-05-13 MED ORDER — LOSARTAN POTASSIUM-HCTZ 100-25 MG PO TABS
1.0000 | ORAL_TABLET | Freq: Every day | ORAL | 3 refills | Status: DC
  Filled 2024-05-13 – 2024-05-18 (×3): qty 90, 90d supply, fill #0

## 2024-05-13 MED ORDER — AMLODIPINE BESYLATE 5 MG PO TABS
5.0000 mg | ORAL_TABLET | Freq: Every day | ORAL | 3 refills | Status: AC
  Filled 2024-05-13: qty 90, 90d supply, fill #0

## 2024-05-13 NOTE — Patient Instructions (Signed)
 Medication Instructions:  Your physician recommends that you continue on your current medications as directed. Please refer to the Current Medication list given to you today.  *If you need a refill on your cardiac medications before your next appointment, please call your pharmacy*  Lab Work: None ordered If you have labs (blood work) drawn today and your tests are completely normal, you will receive your results only by: MyChart Message (if you have MyChart) OR A paper copy in the mail If you have any lab test that is abnormal or we need to change your treatment, we will call you to review the results.  Testing/Procedures: None ordered  Follow-Up: At [facility], you and your health needs are our priority.  As part of our continuing mission to provide you with exceptional heart care, our providers are all part of one team.  This team includes your primary Cardiologist (physician) and Advanced Practice Providers or APPs (Physician Assistants and Nurse Practitioners) who all work together to provide you with the care you need, when you need it.  Your next appointment:   6 month(s)  Provider:   Madonna Large, DO    We recommend signing up for the patient portal called MyChart.  Sign up information is provided on this After Visit Summary.  MyChart is used to connect with patients for Virtual Visits (Telemedicine).  Patients are able to view lab/test results, encounter notes, upcoming appointments, etc.  Non-urgent messages can be sent to your provider as well.   To learn more about what you can do with MyChart, go to [email or web address].   Other Instructions Contact your Primary Car Provider to refill your Januvia 

## 2024-05-13 NOTE — Progress Notes (Signed)
 Cardiology Office Note:  .   Date:  05/13/2024  ID:  [name], DOB 1955-07-28, MRN [id number] PCP:  Celestia Rosaline SQUIBB, NP  Former Cardiology Providers: DR. ALEENE PASSE [facility] Providers Cardiologist:  Madonna Large, DO, [facility] (established care 02/27/24)  Cardiology APP:  Rana Lum CROME, NP  Electrophysiologist:  None  Click to update primary MD,subspecialty MD or APP then REFRESH:1}    No chief complaint on file.   History of Present Illness: .   [name] is a 68 y.o. African-American female whose past medical history and cardiovascular risk factors includes: Coronary artery disease status post CABG, heart failure with mildly reduced LVEF, hypertension, hyperlipidemia, diabetes mellitus type 2.  She had an ED visit on 02/16/2024 for chest pain evaluation.  High sensitive troponins were negative x 2.  Her hypertension was addressed, GI cocktail administered, blood pressures have improved, patient felt stable to be discharged home with outpatient follow-up.  She was seen in the office on 02/27/2024 at that time she did not have any anginal discomfort but overall euvolemic.  She was restarted on her medications which she had ran out.  She was also recommended to restart cardiac rehab.  Reemphasized importance of improving her modifiable cardiovascular risk factors.  The past patient has had difficulty filling prescriptions.  And therefore  She was recommended to follow-up with APP in 6 months and myself in a year.  However, she presents today for follow-up sooner than initial recommendations.  She denies anginal chest pain or heart failure symptoms. Recent echocardiogram notes mild improvement in LV EF. She has completed cardiac rehab and exercises at the [facility] at least 3 times per week and also walking 1 mile.  Overall functional capacity remains relatively stable and no exertional symptoms.   Unfortunately, she ran out of several of her medications for the  last several weeks.  When asked why she did not call sooner for refills patient did not have examination.  Review of Systems: .   Review of Systems  Cardiovascular:  Negative for chest pain, claudication, irregular heartbeat, leg swelling, near-syncope, orthopnea, palpitations, paroxysmal nocturnal dyspnea and syncope.  Respiratory:  Negative for shortness of breath.   Hematologic/Lymphatic: Negative for bleeding problem.    Studies Reviewed:    Echocardiogram: 06/24/2023: 40-45%, regional wall motion abnormalities, grade 1 diastolic dysfunction, elevated LVEDP, severe LAE, trivial MR, estimated RAP 3 mmHg  03/05/2024 LVEF 45-50%, basal inferior segment akinetic, indeterminate diastolic filling pattern, mild MR, estimated RAP 3 mmHg  Left heart catheterization: 06/27/2023 Severe multivessel CAD: Dominance: Right Ostial and proximal LAD 80% stenosis with mild diffuse disease elsewhere Ostial and proximal RI 80% stenosis Proximal LCx 70% stenosis with mid to distal 95% stenosis after small OM 2 Mid RCA 80% Moderate reduced EF by echo 40 to 45% with anterior hypokinesis.   Normal LVEDP  RADIOLOGY: None  Risk Assessment/Calculations:   The 10-year ASCVD risk score (Arnett DK, et al., 2019) is: 24.1%   Values used to calculate the score:     Age: 68 years     Clincally relevant sex: Female     Is Non-Hispanic African American: Yes     Diabetic: Yes     Tobacco smoker: No     Systolic Blood Pressure: 112 mmHg     Is BP treated: Yes     HDL Cholesterol: 76 mg/dL     Total Cholesterol: 252 mg/dL  Labs:       Latest Ref Rng &  Units 05/11/2024    3:40 PM 02/28/2024    2:47 PM 02/16/2024    3:53 AM  CBC  WBC 4.0 - 10.5 K/uL 10.4  9.2  9.3   Hemoglobin 12.0 - 15.0 g/dL 88.1  87.9  88.3   Hematocrit 36.0 - 46.0 % 35.6  37.5  36.9   Platelets 150.0 - 400.0 K/uL 358.0  371.0  344        Latest Ref Rng & Units 05/11/2024    3:40 PM 02/28/2024    2:47 PM 02/16/2024    3:53 AM   BMP  Glucose 70 - 99 mg/dL 856  860  856   BUN 6 - 23 mg/dL 15  17  16    Creatinine 0.40 - 1.20 mg/dL 9.18  9.28  9.08   Sodium 135 - 145 mEq/L 141  142  140   Potassium 3.5 - 5.1 mEq/L 2.9  3.6  4.4   Chloride 96 - 112 mEq/L 101  102  106   CO2 19 - 32 mEq/L 26  27  25    Calcium  8.4 - 10.5 mg/dL 9.3  9.5  9.5       Latest Ref Rng & Units 05/11/2024    3:40 PM 02/28/2024    2:47 PM 02/16/2024    3:53 AM  CMP  Glucose 70 - 99 mg/dL 856  860  856   BUN 6 - 23 mg/dL 15  17  16    Creatinine 0.40 - 1.20 mg/dL 9.18  9.28  9.08   Sodium 135 - 145 mEq/L 141  142  140   Potassium 3.5 - 5.1 mEq/L 2.9  3.6  4.4   Chloride 96 - 112 mEq/L 101  102  106   CO2 19 - 32 mEq/L 26  27  25    Calcium  8.4 - 10.5 mg/dL 9.3  9.5  9.5   Total Protein 6.0 - 8.3 g/dL 7.2  7.7    Total Bilirubin 0.2 - 1.2 mg/dL 0.3  0.3    Alkaline Phos 39 - 117 U/L 97  91    AST 0 - 37 U/L 12  13    ALT 0 - 35 U/L 10  8      Lab Results  Component Value Date   CHOL 252 (H) 01/24/2024   HDL 76 01/24/2024   LDLCALC 146 (H) 01/24/2024   TRIG 168 (H) 01/24/2024   CHOLHDL 3.3 01/24/2024   Recent Labs    06/25/23 1202  LIPOA <8.4   No components found for: NTPROBNP Recent Labs    11/01/23 1715  PROBNP 869*   Recent Labs    07/25/23 0749  TSH 2.280    Physical Exam:    Today's Vitals   05/13/24 0909  BP: 112/86  Pulse: 90  SpO2: 97%  Weight: 113 lb 6.4 oz (51.4 kg)  Height: 5' 1 (1.549 m)    Body mass index is 21.43 kg/m. Wt Readings from Last 3 Encounters:  05/13/24 113 lb 6.4 oz (51.4 kg)  05/11/24 117 lb 8 oz (53.3 kg)  05/05/24 116 lb (52.6 kg)    Physical Exam  Constitutional: No distress.  hemodynamically stable  Neck: No JVD present.  Cardiovascular: Normal rate, regular rhythm, S1 normal and S2 normal. Exam reveals no gallop, no S3 and no S4.  No murmur heard. Pulmonary/Chest: Effort normal and breath sounds normal. No stridor. She has no wheezes. She has no rales.   Musculoskeletal:  General: No edema.     Cervical back: Neck supple.  Skin: Skin is warm.     Impression & Recommendation(s):  Impression:   ICD-10-CM   1. Coronary artery disease involving native coronary artery of native heart without angina pectoris  I25.10 clopidogrel  (PLAVIX ) 75 MG tablet    2. Hx of CABG  Z95.1 clopidogrel  (PLAVIX ) 75 MG tablet    3. Chronic heart failure with mildly reduced ejection fraction (HFmrEF, 41-49%) (HCC)  I50.22 dapagliflozin  propanediol (FARXIGA ) 10 MG TABS tablet    metoprolol  tartrate (LOPRESSOR ) 100 MG tablet    4. Essential hypertension  I10 amLODipine  (NORVASC ) 5 MG tablet    5. Mixed hyperlipidemia  E78.2 ezetimibe  (ZETIA ) 10 MG tablet        Recommendation(s):  Coronary artery disease involving native coronary artery of native heart without angina pectoris Hx of CABG Denies anginal chest pain or heart failure symptoms. Echocardiogram 02/2024 notes mild improvement in LVEF. Refill Plavix  75 mg p.o. daily. Refill Zetia  10 mg p.o. daily. Remains on Crestor  and Repatha .  Chronic heart failure with mildly reduced ejection fraction (HFmrEF, 41-49%) (HCC) NYHA class II. Ran out of medications for the last several weeks. Refill amlodipine  5 mg p.o. daily. Refill Farxiga  10 mg p.o. daily. Refill metoprolol . Echo results reviewed with the patient. Emphasized importance of improving her modifiable cardiovascular risk factors.  Essential hypertension Office blood pressures are well-controlled. Medications as discussed above  Mixed hyperlipidemia Currently on Zetia  and Crestor . Remains on Repatha  with plans to repeat fasting lipids in December 2025-given her history of CAD/CABG would recommend a goal LDL at least less than 70 mg/dL and if able to tolerate close to 55 mg/dL for secondary prevention.  Orders Placed:  No orders of the defined types were placed in this encounter.    Final Medication List:    Meds ordered this  encounter  Medications   amLODipine  (NORVASC ) 5 MG tablet    Sig: Take 1 tablet (5 mg total) by mouth daily.    Dispense:  90 tablet    Refill:  3   clopidogrel  (PLAVIX ) 75 MG tablet    Sig: Take 1 tablet (75 mg total) by mouth daily.    Dispense:  90 tablet    Refill:  3   dapagliflozin  propanediol (FARXIGA ) 10 MG TABS tablet    Sig: Take 1 tablet (10 mg total) by mouth daily before breakfast.    Dispense:  90 tablet    Refill:  3    Please use [employer] grant to cover patient cost   ezetimibe  (ZETIA ) 10 MG tablet    Sig: Take 1 tablet (10 mg total) by mouth daily.    Dispense:  90 tablet    Refill:  3   metoprolol  tartrate (LOPRESSOR ) 100 MG tablet    Sig: Take 1 tablet (100 mg total) by mouth in the morning AND 1.5 tablets (150 mg total) every evening.    Dispense:  225 tablet    Refill:  3    Medications Discontinued During This Encounter  Medication Reason   clopidogrel  (PLAVIX ) 75 MG tablet Reorder   ezetimibe  (ZETIA ) 10 MG tablet Reorder   metoprolol  tartrate (LOPRESSOR ) 100 MG tablet Reorder   dapagliflozin  propanediol (FARXIGA ) 10 MG TABS tablet Reorder   amLODipine  (NORVASC ) 5 MG tablet Reorder     Current Outpatient Medications:    Accu-Chek Softclix Lancets lancets, Use to check blood sugar once daily., Disp: 100 each, Rfl: 2   aspirin  EC 81  MG tablet, Take 1 tablet (81 mg total) by mouth daily., Disp: , Rfl:    Blood Glucose Monitoring Suppl (ACCU-CHEK GUIDE ME) w/Device KIT, USE TO CHECK BLOOD SUGAR ONCE DAILY, Disp: 1 kit, Rfl: 0   dicyclomine  (BENTYL ) 10 MG capsule, Take 1 capsule (10 mg total) by mouth 3 (three) times daily before meals., Disp: 90 capsule, Rfl: 1   Evolocumab  (REPATHA  SURECLICK) 140 MG/ML SOAJ, Inject 140 mg into the skin every 14 (fourteen) days., Disp: 6 mL, Rfl: 3   fluconazole  (DIFLUCAN ) 150 MG tablet, Take 1 tablet (150 mg total) by mouth daily., Disp: 1 tablet, Rfl: 1   furosemide  (LASIX ) 20 MG tablet, Take 1 tablet (20 mg total) by  mouth daily as needed., Disp: 90 tablet, Rfl: 3   glucose blood (ACCU-CHEK GUIDE TEST) test strip, Use to check blood sugar once daily., Disp: 100 each, Rfl: 2   JANUVIA  25 MG tablet, Take 25 mg by mouth daily., Disp: , Rfl:    ondansetron  (ZOFRAN -ODT) 4 MG disintegrating tablet, Take 1 tablet (4 mg total) by mouth every 8 (eight) hours as needed., Disp: 12 tablet, Rfl: 0   pantoprazole  (PROTONIX ) 40 MG tablet, Take 1 tablet (40 mg total) by mouth daily. (Patient taking differently: Take 40 mg by mouth daily as needed.), Disp: 90 tablet, Rfl: 3   Potassium Chloride  ER 20 MEQ TBCR, Take 1 tablet (20 mEq total) by mouth daily as needed when taking furosemide  (lasix )., Disp: 90 tablet, Rfl: 3   potassium chloride  SA (KLOR-CON  M) 20 MEQ tablet, Take 1 tablet (20 mEq total) by mouth daily as needed (when taking furosemide  (lasix ))., Disp: 90 tablet, Rfl: 3   pregabalin (LYRICA) 50 MG capsule, Take 50 mg by mouth 3 (three) times daily., Disp: , Rfl:    rosuvastatin  (CRESTOR ) 40 MG tablet, Take 1 tablet (40 mg total) by mouth daily., Disp: 90 tablet, Rfl: 3   traZODone (DESYREL) 50 MG tablet, Take 0.5-1 tablets (25-50 mg total) by mouth at bedtime as needed for sleep., Disp: 30 tablet, Rfl: 1   triamcinolone  0.1%-silver sulfadiazine 1:1 cream mixture, Apply topically daily., Disp: 16 g, Rfl: 0   amLODipine  (NORVASC ) 5 MG tablet, Take 1 tablet (5 mg total) by mouth daily., Disp: 90 tablet, Rfl: 3   clopidogrel  (PLAVIX ) 75 MG tablet, Take 1 tablet (75 mg total) by mouth daily., Disp: 90 tablet, Rfl: 3   dapagliflozin  propanediol (FARXIGA ) 10 MG TABS tablet, Take 1 tablet (10 mg total) by mouth daily before breakfast., Disp: 90 tablet, Rfl: 3   ezetimibe  (ZETIA ) 10 MG tablet, Take 1 tablet (10 mg total) by mouth daily., Disp: 90 tablet, Rfl: 3   losartan -hydrochlorothiazide  (HYZAAR ) 100-25 MG tablet, Take 1 tablet by mouth daily., Disp: 90 tablet, Rfl: 3   metoprolol  tartrate (LOPRESSOR ) 100 MG tablet, Take 1  tablet (100 mg total) by mouth in the morning AND 1.5 tablets (150 mg total) every evening., Disp: 225 tablet, Rfl: 3  Consent:   NA  Disposition:   57-month follow-up sooner if needed  Her questions and concerns were addressed to her satisfaction. She voices understanding of the recommendations provided during this encounter.    Signed, Madonna Michele HAS, [facility]  A Division of [facility] [address] [place], [place] [address]  05/13/2024 8:18 PM

## 2024-05-14 ENCOUNTER — Encounter: Payer: Self-pay | Admitting: Physician Assistant

## 2024-05-14 ENCOUNTER — Ambulatory Visit: Payer: Self-pay

## 2024-05-14 ENCOUNTER — Ambulatory Visit: Admitting: Physician Assistant

## 2024-05-14 VITALS — BP 119/95 | HR 117 | Temp 98.0°F | Wt 114.0 lb

## 2024-05-14 DIAGNOSIS — R197 Diarrhea, unspecified: Secondary | ICD-10-CM

## 2024-05-14 DIAGNOSIS — E876 Hypokalemia: Secondary | ICD-10-CM | POA: Diagnosis not present

## 2024-05-14 NOTE — Telephone Encounter (Signed)
 Requested Prescriptions  Refused Prescriptions Disp Refills   FARXIGA  5 MG TABS tablet [Pharmacy Med Name: FARXIGA  5 MG TABLET] 90 tablet 1    Sig: TAKE 1 TABLET (5 MG TOTAL) BY MOUTH DAILY.     Endocrinology:  Diabetes - SGLT2 Inhibitors Passed - 05/14/2024  5:05 PM      Passed - Cr in normal range and within 360 days    Creat  Date Value Ref Range Status  02/24/2013 0.72 0.50 - 1.10 mg/dL Final   Creatinine, Ser  Date Value Ref Range Status  05/11/2024 0.81 0.40 - 1.20 mg/dL Final         Passed - HBA1C is between 0 and 7.9 and within 180 days    HbA1c, POC (controlled diabetic range)  Date Value Ref Range Status  02/19/2024 6.7 0.0 - 7.0 % Final         Passed - eGFR in normal range and within 360 days    GFR calc Af Amer  Date Value Ref Range Status  05/27/2020 109 >59 mL/min/1.73 Final    Comment:    **In accordance with recommendations from the NKF-ASN Task force,**   [facility] is in the process of updating its eGFR calculation to the   2021 CKD-EPI creatinine equation that estimates kidney function   without a race variable.    GFR, Estimated  Date Value Ref Range Status  02/16/2024 >60 >60 mL/min Final    Comment:    (NOTE) Calculated using the CKD-EPI Creatinine Equation (2021)    GFR  Date Value Ref Range Status  05/11/2024 73.95 >60.00 mL/min Final    Comment:    Calculated using the CKD-EPI Creatinine Equation (2021)   eGFR  Date Value Ref Range Status  01/24/2024 75 >59 mL/min/1.73 Final         Passed - Valid encounter within last 6 months    Recent Outpatient Visits           1 week ago Dysuria   [place] [facility] Celestia Rosaline SQUIBB, NP   3 weeks ago Essential hypertension   [place] [facility] Celestia Rosaline SQUIBB, NP   2 months ago Diabetes mellitus type 2 in nonobese [facility])   [place] [facility] Celestia Rosaline SQUIBB, NP   3 months ago Anxiety as acute reaction to exceptional  stress   [place] [facility] Celestia Rosaline SQUIBB, NP   5 months ago Chronic pain of both shoulders   [place] [facility] Celestia Rosaline SQUIBB, NP

## 2024-05-14 NOTE — Progress Notes (Signed)
 Established Patient Office Visit  Subjective   Patient ID: [name], female    DOB: 17-Jun-1956  Age: 68 y.o. MRN: [id number]  Chief Complaint  Patient presents with   Diarrhea    She has had diarrhea since this past Tuesday, no abdominal pain, denies fever, denies N/V    Discussed the use of AI scribe software for clinical note transcription with the patient, who gave verbal consent to proceed.  History of Present Illness  [name] is a 68 years female who presents to the mobile health medicine unit due to 2 days of diarrhea. Patient reports she ate a chicken salad and symptoms began after. Patient states she was able to keep broth down today. She has not taken anything for relief. Patient denies fevers, chills, stomach cramps, or bleeding in the stool. Patient reports her stools being green/yellow colored and has increased her fluid intake due to fear of becoming dehydrated.   Of note, patient states her urine output has increased within the last 3 weeks. Patient states her she has checked her BP at home and have been well controlled, and her blood sugars have ran in the 90's.   Past Medical History:  Diagnosis Date   Anemia    hx   Aneurysm    small, on left side of brain    Anxiety    hx of   Arthritis    generalized   CAD (coronary artery disease)    CHF (congestive heart failure) (HCC) 2025   Diabetes mellitus without complication (HCC)    diet control, pt denies- on medications   GERD (gastroesophageal reflux disease)    iwht certain foods/on meds/OTC PRN meds   Heart failure with mildly reduced ejection fraction (HFmrEF) (HCC)    Hyperlipidemia    on meds   Hypertension    on meds   TIA (transient ischemic attack)    pt unaware of this hx on 06/19/2017   Social History   Socioeconomic History   Marital status: Widowed    Spouse name: Not on file   Number of children: 1   Years of education: Not on file   Highest education level: Some  college, no degree  Occupational History   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      2023/05/17 mother passed away a month ago   Social Drivers of Corporate Investment Banker Strain: Low Risk  (17-May-2023)   Overall Financial Resource Strain (CARDIA)    Difficulty of Paying Living Expenses: Not very hard  Food Insecurity: No Food Insecurity (05/05/2024)   Hunger Vital Sign    Worried About Running Out of Food in the Last Year: Never true    Ran Out of Food in the Last Year: Never true  Transportation Needs: No Transportation Needs (05/05/2024)   PRAPARE - Transportation    Lack of  Transportation (Medical): No    Lack of Transportation (Non-Medical): No  Physical Activity: Inactive (05/05/2024)   Exercise Vital Sign    Days of Exercise per Week: 0 days    Minutes of Exercise per Session: 0 min  Stress: Stress Concern Present (05/05/2024)   [employer] of Occupational Health - Occupational Stress Questionnaire    Feeling of Stress: Rather much  Social Connections: Moderately Isolated (05/05/2024)   Social Connection and Isolation Panel    Frequency of Communication with Friends and Family: Once a week    Frequency of Social Gatherings with Friends and Family: More than three times a week    Attends Religious Services: 1 to 4 times per year    Active Member of [employer] or Organizations: No    Attends Banker Meetings: Never    Marital Status: Widowed   Intimate Partner Violence: Not At Risk (05/05/2024)   Humiliation, Afraid, Rape, and Kick questionnaire    Fear of Current or Ex-Partner: No    Emotionally Abused: No    Physically Abused: No    Sexually Abused: No   Family History  Problem Relation Age of Onset   Hypertension Mother    Arthritis Mother    Diabetes Mother    Dementia Mother    Heart attack Father    Heart disease Father    Hypertension Sister    Diabetes Sister    Hypertension Sister    Heart disease Sister    Diabetes Sister    Hypertension Brother    Heart disease Brother    Diabetes Brother    Hypertension Brother    Hypertension Brother    Hypertension Brother    Cancer Neg Hx    Colon cancer Neg Hx    Colon polyps Neg Hx    Esophageal cancer Neg Hx    Rectal cancer Neg Hx    Stomach cancer Neg Hx    Breast cancer Neg Hx    No Known Allergies  Review of Systems  Constitutional: Negative.   HENT: Negative.    Eyes: Negative.   Respiratory: Negative.    Cardiovascular: Negative.   Gastrointestinal: Negative.   Genitourinary: Negative.   Musculoskeletal: Negative.   Skin: Negative.   Neurological: Negative.   Endo/Heme/Allergies: Negative.   Psychiatric/Behavioral: Negative.        Objective:     BP (!) 119/95 (BP Location: Left Arm, Patient Position: Standing)   Pulse (!) 117   Temp 98 F (36.7 C)   Wt 114 lb (51.7 kg)   SpO2 99%   BMI 21.54 kg/m  BP Readings from Last 3 Encounters:  05/14/24 (!) 119/95  05/13/24 112/86  05/11/24 118/74   Wt Readings from Last 3 Encounters:  05/14/24 114 lb (51.7 kg)  05/13/24 113 lb 6.4 oz (51.4 kg)  05/11/24 117 lb 8 oz (53.3 kg)    Physical Exam Vitals and nursing note reviewed.  Constitutional:      Appearance: Normal appearance.  HENT:     Head: Normocephalic and atraumatic.     Right Ear: Tympanic membrane and external ear normal.     Left Ear: Tympanic membrane and external ear normal.     Nose: Nose normal.      Mouth/Throat:     Mouth: Mucous membranes are moist.     Pharynx: Oropharynx is clear.  Eyes:     Extraocular Movements: Extraocular movements intact.     Conjunctiva/sclera: Conjunctivae normal.     Pupils: Pupils  are equal, round, and reactive to light.  Cardiovascular:     Rate and Rhythm: Regular rhythm. Tachycardia present.     Pulses: Normal pulses.     Heart sounds: Normal heart sounds.  Pulmonary:     Effort: Pulmonary effort is normal.     Breath sounds: Normal breath sounds.  Abdominal:     General: Abdomen is flat. Bowel sounds are normal.     Palpations: Abdomen is soft.  Musculoskeletal:        General: Normal range of motion.     Cervical back: Normal range of motion and neck supple.  Skin:    General: Skin is warm and dry.     Capillary Refill: Capillary refill takes less than 2 seconds.  Neurological:     General: No focal deficit present.     Mental Status: She is alert and oriented to person, place, and time.  Psychiatric:        Mood and Affect: Mood normal.        Behavior: Behavior normal.        Assessment & Plan:   Problem List Items Addressed This Visit       Other   Hypokalemia   Relevant Orders   Basic Metabolic Panel   Other Visit Diagnoses       Acute diarrhea    -  Primary   Relevant Orders   Basic Metabolic Panel      Diarrhea: - OTC Imodium   - BRAT (Banana, Rice, Apple Sauce, Toast) diet.  - Increase fluid intake aiming for 5 -6 glasses of water daily.   Hypokalemia: - Restart Potassium supplement Unable to obtain BMP, patient to follow up next week for lab draw.   The patient was given clear instructions to go to ER or return to medical center if symptoms don't improve, worsen or new problems develop. The patient verbalized understanding.   Return in about 1 week (around 05/21/2024), or if symptoms worsen or fail to improve.   I have reviewed the patient's medical history (PMH, PSH, Social History, Family History,  Medications, and allergies) , and have been updated if relevant. I spent 30 minutes reviewing chart and  face to face time with patient.    Kirk RAMAN Mayers, PA-C

## 2024-05-14 NOTE — Patient Instructions (Addendum)
 I do encourage you to restart your potassium, make sure that you are staying well-hydrated, you can use Imodium  as directed over-the-counter.  And follow a BRAT diet.  Your today's lab results  Diarrhea, Adult Diarrhea is frequent loose and sometimes watery bowel movements. Diarrhea can make you feel weak and cause you to become dehydrated. Dehydration is a condition in which there is not enough water or other fluids in the body. Dehydration can make you tired and thirsty, cause you to have a dry mouth, and decrease how often you urinate. Diarrhea typically lasts 2-3 days. However, it can last longer if it is a sign of something more serious. It is important to treat your diarrhea as told by your health care provider. Follow these instructions at home: Eating and drinking     Follow these recommendations as told by your health care provider: Take an oral rehydration solution (ORS). This is an over-the-counter medicine that helps return your body to its normal balance of nutrients and water. It is found at pharmacies and retail stores. Drink enough fluid to keep your urine pale yellow. Drink fluids such as water, diluted fruit juice, and low-calorie sports drinks. You can drink milk also, if desired. Sucking on ice chips is another way to get fluids. Avoid drinking fluids that contain a lot of sugar or caffeine, such as soda, energy drinks, and regular sports drinks. Avoid alcohol. Eat bland, easy-to-digest foods in small amounts as you are able. These foods include bananas, applesauce, rice, lean meats, toast, and crackers. Avoid spicy or fatty foods.  Medicines Take over-the-counter and prescription medicines only as told by your health care provider. If you were prescribed antibiotics, take them as told by your health care provider. Do not stop using the antibiotic even if you start to feel better. General instructions  Wash your hands often using soap and water for at least 20 seconds. If  soap and water are not available, use hand sanitizer. Others in the household should wash their hands as well. Hands should be washed: After using the toilet or changing a diaper. Before preparing, cooking, or serving food. While caring for a sick person or while visiting someone in a hospital. Rest at home while you recover. Take a warm bath to relieve any burning or pain from frequent diarrhea episodes. Watch your condition for any changes. Contact a health care provider if: You have a fever. Your diarrhea gets worse. You have new symptoms. You vomit every time you eat or drink. You feel light-headed, dizzy, or have a headache. You have muscle cramps. You have signs of dehydration, such as: Dark urine, very little urine, or no urine. Cracked lips. Dry mouth. Sunken eyes. Sleepiness. Weakness. You have bloody or black stools or stools that look like tar. You have severe pain, cramping, or bloating in your abdomen. Your skin feels cold and clammy. You feel confused. Get help right away if: You have chest pain or your heart is beating very quickly. You have trouble breathing or you are breathing very quickly. You feel extremely weak or you faint. These symptoms may be an emergency. Get help right away. Call 911. Do not wait to see if the symptoms will go away. Do not drive yourself to the hospital. This information is not intended to replace advice given to you by your health care provider. Make sure you discuss any questions you have with your health care provider. Document Revised: 11/28/2021 Document Reviewed: 11/28/2021 Elsevier Patient Education  2024 [employer]

## 2024-05-14 NOTE — Telephone Encounter (Signed)
 FYI Only or Action Required?: FYI only for provider: No appts in timeframe, advised mobile unit or UC today.  Patient was last seen in primary care on 05/04/2024 by Celestia Rosaline SQUIBB, NP.  Called Nurse Triage reporting Diarrhea.  Symptoms began several days ago.  Interventions attempted: Rest, hydration, or home remedies.  Symptoms are: gradually worsening.  Triage Disposition: See Physician Within 24 Hours  Patient/caregiver understands and will follow disposition?:   Copied from CRM #[id number]. Topic: Clinical - Red Word Triage >> May 14, 2024  2:29 PM Olam RAMAN wrote: Red Word that prompted transfer to Nurse Triage: Pt has been having diarrhea since Tuesday. Drinking broths to help being dehydrated, no pain Reason for Disposition  [1] SEVERE diarrhea (e.g., 7 or more times / day more than normal) AND [2] present > 24 hours (1 day)  Answer Assessment - Initial Assessment Questions Pt reports onset on diarrhea on Tuesday. Has gone 10-13 times in past 24 hours with green mucus stool. Has lost control of bowels a few times. Pt is staying hydrated and drinking plenty of fluids. Reports mild weakness. Denies other symptoms. No appts in timeframe, advised mobile unit or UC in next 24 hours.   1. DIARRHEA SEVERITY: How bad is the diarrhea? How many more stools have you had in the past 24 hours than normal?      Between 10-13 times in past 24 hours  2. ONSET: When did the diarrhea begin?      Tuesday  3. STOOL DESCRIPTION:  How loose or watery is the diarrhea? What is the stool color? Is there any blood or mucous in the stool?     Green with mucus   4. VOMITING: Are you also vomiting? If Yes, ask: How many times in the past 24 hours?      Denies  5. ABDOMEN PAIN: Are you having any abdomen pain? If Yes, ask: What does it feel like? (e.g., crampy, dull, intermittent, constant)      Denies  6. ABDOMEN PAIN SEVERITY: If present, ask: How bad is the pain?  (e.g.,  Scale 1-10; mild, moderate, or severe)     Denies  7. ORAL INTAKE: If vomiting, Have you been able to drink liquids? How much liquids have you had in the past 24 hours?     Pt drinking a lot to stay hydrated  8. HYDRATION: Any signs of dehydration? (e.g., dry mouth [not just dry lips], too weak to stand, dizziness, new weight loss) When did you last urinate?     Some weakness, some dry mouth for the past few days, urinated this morning a normal amount  9. EXPOSURE: Have you traveled to a foreign country recently? Have you been exposed to anyone with diarrhea? Could you have eaten any food that was spoiled?     Denies  10. ANTIBIOTIC USE: Are you taking antibiotics now or have you taken antibiotics in the past 2 months?       Denies  11. OTHER SYMPTOMS: Do you have any other symptoms? (e.g., fever, blood in stool)       Denies fever or blood in stool  Protocols used: Diarrhea-A-AH

## 2024-05-16 ENCOUNTER — Encounter (HOSPITAL_COMMUNITY): Payer: Self-pay

## 2024-05-16 ENCOUNTER — Emergency Department (HOSPITAL_COMMUNITY)
Admission: EM | Admit: 2024-05-16 | Discharge: 2024-05-16 | Disposition: A | Discharge status: Home / Self Care | Attending: Emergency Medicine | Admitting: Emergency Medicine

## 2024-05-16 ENCOUNTER — Other Ambulatory Visit: Payer: Self-pay

## 2024-05-16 DIAGNOSIS — Z7901 Long term (current) use of anticoagulants: Secondary | ICD-10-CM | POA: Diagnosis not present

## 2024-05-16 DIAGNOSIS — E876 Hypokalemia: Secondary | ICD-10-CM | POA: Insufficient documentation

## 2024-05-16 DIAGNOSIS — R197 Diarrhea, unspecified: Secondary | ICD-10-CM | POA: Insufficient documentation

## 2024-05-16 DIAGNOSIS — R112 Nausea with vomiting, unspecified: Secondary | ICD-10-CM | POA: Diagnosis not present

## 2024-05-16 DIAGNOSIS — Z7982 Long term (current) use of aspirin: Secondary | ICD-10-CM | POA: Diagnosis not present

## 2024-05-16 LAB — COMPREHENSIVE METABOLIC PANEL WITH GFR
ALT: 15 U/L (ref 0–44)
AST: 19 U/L (ref 15–41)
Albumin: 3.4 g/dL — ABNORMAL LOW (ref 3.5–5.0)
Alkaline Phosphatase: 96 U/L (ref 38–126)
Anion gap: 17 — ABNORMAL HIGH (ref 5–15)
BUN: 20 mg/dL (ref 8–23)
CO2: 20 mmol/L — ABNORMAL LOW (ref 22–32)
Calcium: 8.8 mg/dL — ABNORMAL LOW (ref 8.9–10.3)
Chloride: 102 mmol/L (ref 98–111)
Creatinine, Ser: 1.14 mg/dL — ABNORMAL HIGH (ref 0.44–1.00)
GFR, Estimated: 52 mL/min — ABNORMAL LOW (ref >60)
Glucose, Bld: 118 mg/dL — ABNORMAL HIGH (ref 70–99)
Potassium: 2.9 mmol/L — ABNORMAL LOW (ref 3.5–5.1)
Sodium: 139 mmol/L (ref 135–145)
Total Bilirubin: 0.7 mg/dL (ref 0.0–1.2)
Total Protein: 7.2 g/dL (ref 6.5–8.1)

## 2024-05-16 LAB — CBC WITH DIFFERENTIAL/PLATELET
Abs Immature Granulocytes: 0.04 K/uL (ref 0.00–0.07)
Basophils Absolute: 0.1 K/uL (ref 0.0–0.1)
Basophils Relative: 1 %
Eosinophils Absolute: 0.2 K/uL (ref 0.0–0.5)
Eosinophils Relative: 2 %
HCT: 40.1 % (ref 36.0–46.0)
Hemoglobin: 12.8 g/dL (ref 12.0–15.0)
Immature Granulocytes: 1 %
Lymphocytes Relative: 17 %
Lymphs Abs: 1.4 K/uL (ref 0.7–4.0)
MCH: 26.3 pg (ref 26.0–34.0)
MCHC: 31.9 g/dL (ref 30.0–36.0)
MCV: 82.5 fL (ref 80.0–100.0)
Monocytes Absolute: 0.7 K/uL (ref 0.1–1.0)
Monocytes Relative: 9 %
Neutro Abs: 6.1 K/uL (ref 1.7–7.7)
Neutrophils Relative %: 70 %
Platelets: 312 K/uL (ref 150–400)
RBC: 4.86 MIL/uL (ref 3.87–5.11)
RDW: 13.4 % (ref 11.5–15.5)
WBC: 8.4 K/uL (ref 4.0–10.5)
nRBC: 0 % (ref 0.0–0.2)

## 2024-05-16 LAB — LIPASE, BLOOD: Lipase: 23 U/L (ref 11–51)

## 2024-05-16 LAB — MAGNESIUM: Magnesium: 1.6 mg/dL — ABNORMAL LOW (ref 1.7–2.4)

## 2024-05-16 MED ORDER — SODIUM CHLORIDE 0.9 % IV BOLUS
500.0000 mL | Freq: Once | INTRAVENOUS | Status: AC
  Administered 2024-05-16: 500 mL via INTRAVENOUS

## 2024-05-16 MED ORDER — POTASSIUM CHLORIDE CRYS ER 20 MEQ PO TBCR
40.0000 meq | EXTENDED_RELEASE_TABLET | Freq: Once | ORAL | Status: AC
  Administered 2024-05-16: 40 meq via ORAL
  Filled 2024-05-16: qty 2

## 2024-05-16 MED ORDER — ONDANSETRON HCL 4 MG/2ML IJ SOLN
4.0000 mg | Freq: Once | INTRAMUSCULAR | Status: AC
  Administered 2024-05-16: 4 mg via INTRAVENOUS
  Filled 2024-05-16: qty 2

## 2024-05-16 MED ORDER — MAGNESIUM SULFATE 2 GM/50ML IV SOLN
2.0000 g | Freq: Once | INTRAVENOUS | Status: AC
  Administered 2024-05-16: 2 g via INTRAVENOUS
  Filled 2024-05-16: qty 50

## 2024-05-16 NOTE — ED Provider Notes (Signed)
 [place] EMERGENCY DEPARTMENT AT [facility] Provider Note   CSN: [id number] Arrival date & time: 05/16/24  [id number]     Patient presents with: Diarrhea and Nausea   [name] is a 68 y.o. female.   68 year old female with prior medical history as detailed below presents for evaluation.  Patient complains of profuse loose diarrheal stool since Tuesday.  Symptoms have been persistent for the last 4 days.  She reports intermittent nausea and some mild vomiting.  She denies any vomiting the last 24 hours.  No fever reported.  No abdominal pain.  No chest pain.  No shortness of breath.  She did not have any medications at home for her nausea.  She denies bloody stools or bloody emesis.  The history is provided by the patient and medical records.       Prior to Admission medications   Medication Sig Start Date End Date Taking? Authorizing Provider  Accu-Chek Softclix Lancets lancets Use to check blood sugar once daily. 12/03/23   Newlin, Enobong, MD  amLODipine  (NORVASC ) 5 MG tablet Take 1 tablet (5 mg total) by mouth daily. 05/13/24   Tolia, Sunit, DO  aspirin  EC 81 MG tablet Take 1 tablet (81 mg total) by mouth daily. 07/10/23   Barrett, Erin R, PA-C  Blood Glucose Monitoring Suppl (ACCU-CHEK GUIDE ME) w/Device KIT USE TO CHECK BLOOD SUGAR ONCE DAILY 02/04/24   Newlin, Enobong, MD  clopidogrel  (PLAVIX ) 75 MG tablet Take 1 tablet (75 mg total) by mouth daily. 05/13/24 05/13/25  Tolia, Sunit, DO  dapagliflozin  propanediol (FARXIGA ) 10 MG TABS tablet Take 1 tablet (10 mg total) by mouth daily before breakfast. 05/13/24   Tolia, Sunit, DO  dicyclomine  (BENTYL ) 10 MG capsule Take 1 capsule (10 mg total) by mouth 3 (three) times daily before meals. 03/26/24   Craig Alan SAUNDERS, PA-C  Evolocumab  (REPATHA  SURECLICK) 140 MG/ML SOAJ Inject 140 mg into the skin every 14 (fourteen) days. 04/17/24   Tolia, Sunit, DO  ezetimibe  (ZETIA ) 10 MG tablet Take 1 tablet (10 mg total) by mouth  daily. 05/13/24   Tolia, Sunit, DO  fluconazole  (DIFLUCAN ) 150 MG tablet Take 1 tablet (150 mg total) by mouth daily. Patient not taking: Reported on 05/14/2024 08/09/23   Celestia Rosaline SQUIBB, NP  furosemide  (LASIX ) 20 MG tablet Take 1 tablet (20 mg total) by mouth daily as needed. 11/01/23   Wyn Jackee VEAR Mickey., NP  glucose blood (ACCU-CHEK GUIDE TEST) test strip Use to check blood sugar once daily. 12/03/23   Newlin, Enobong, MD  JANUVIA  25 MG tablet Take 25 mg by mouth daily. 09/02/23   [provider]  losartan -hydrochlorothiazide  (HYZAAR ) 100-25 MG tablet Take 1 tablet by mouth daily. 05/13/24   Newlin, Enobong, MD  metoprolol  tartrate (LOPRESSOR ) 100 MG tablet Take 1 tablet (100 mg total) by mouth in the morning AND 1.5 tablets (150 mg total) every evening. 05/13/24   Tolia, Sunit, DO  ondansetron  (ZOFRAN -ODT) 4 MG disintegrating tablet Take 1 tablet (4 mg total) by mouth every 8 (eight) hours as needed. 02/16/24   Griselda Norris, MD  pantoprazole  (PROTONIX ) 40 MG tablet Take 1 tablet (40 mg total) by mouth daily. 02/27/24   Craig Alan SAUNDERS, PA-C  Potassium Chloride  ER 20 MEQ TBCR Take 1 tablet (20 mEq total) by mouth daily as needed when taking furosemide  (lasix ). 11/01/23   Wyn Jackee VEAR Mickey., NP  potassium chloride  SA (KLOR-CON  M) 20 MEQ tablet Take 1 tablet (20 mEq total) by  mouth daily as needed (when taking furosemide  (lasix )). 11/01/23   Wyn Jackee VEAR Mickey., NP  pregabalin (LYRICA) 50 MG capsule Take 50 mg by mouth 3 (three) times daily. 04/30/22   [provider]  rosuvastatin  (CRESTOR ) 40 MG tablet Take 1 tablet (40 mg total) by mouth daily. 11/01/23   Wyn Jackee VEAR Mickey., NP  traZODone  (DESYREL ) 50 MG tablet Take 0.5-1 tablets (25-50 mg total) by mouth at bedtime as needed for sleep. 05/04/24   Celestia Rosaline SQUIBB, NP  triamcinolone  0.1%-silver sulfadiazine 1:1 cream mixture Apply topically daily. 05/04/24   Celestia Rosaline SQUIBB, NP    Allergies: Patient has no known allergies.     Review of Systems  All other systems reviewed and are negative.   Updated Vital Signs BP 105/78 (BP Location: Right Arm)   Pulse 93   Temp 97.6 F (36.4 C) (Oral)   Resp 16   Ht 5' 1 (1.549 m)   Wt 52.6 kg   SpO2 100%   BMI 21.92 kg/m   Physical Exam Vitals and nursing note reviewed.  Constitutional:      General: She is not in acute distress.    Appearance: Normal appearance. She is well-developed.  HENT:     Head: Normocephalic and atraumatic.  Eyes:     Conjunctiva/sclera: Conjunctivae normal.     Pupils: Pupils are equal, round, and reactive to light.  Cardiovascular:     Rate and Rhythm: Normal rate and regular rhythm.     Heart sounds: Normal heart sounds.  Pulmonary:     Effort: Pulmonary effort is normal. No respiratory distress.     Breath sounds: Normal breath sounds.  Abdominal:     General: There is no distension.     Palpations: Abdomen is soft.     Tenderness: There is no abdominal tenderness.  Musculoskeletal:        General: No deformity. Normal range of motion.     Cervical back: Normal range of motion and neck supple.  Skin:    General: Skin is warm and dry.  Neurological:     General: No focal deficit present.     Mental Status: She is alert and oriented to person, place, and time.     (all labs ordered are listed, but only abnormal results are displayed) Labs Reviewed  COMPREHENSIVE METABOLIC PANEL WITH GFR  CBC WITH DIFFERENTIAL/PLATELET  MAGNESIUM   LIPASE, BLOOD    EKG: None  Radiology: No results found.   Procedures   Medications Ordered in the ED  sodium chloride  0.9 % bolus 500 mL (has no administration in time range)  ondansetron  (ZOFRAN ) injection 4 mg (has no administration in time range)                                    Medical Decision Making Patient reports several days of diarrhea.  Patient is nontoxic in appearance.  Labs reveal mild hypokalemia and hypomagnesemia.    With IV fluids, and replacement  of potassium, and replacement of magnesium  the patient feels much improved.  She is now appropriate for discharge.  Importance of close follow-up was stressed.  Strict return precautions given and understood.  Amount and/or Complexity of Data Reviewed Labs: ordered.  Risk Prescription drug management.        Final diagnoses:  Diarrhea, unspecified type  Hypokalemia  Hypomagnesemia    ED Discharge Orders     None  Laurice Maude BROCKS, MD 05/16/24 [phone]

## 2024-05-16 NOTE — ED Triage Notes (Signed)
 Pt c.o diarrhea since Tuesday with vomiting. None this morning, just co nausea. Pt denies abd pain.

## 2024-05-16 NOTE — Discharge Instructions (Signed)
 Return for any problem.  ?

## 2024-05-18 ENCOUNTER — Other Ambulatory Visit: Payer: Self-pay

## 2024-05-18 ENCOUNTER — Encounter: Payer: Self-pay | Admitting: Physician Assistant

## 2024-05-18 ENCOUNTER — Other Ambulatory Visit (HOSPITAL_COMMUNITY): Payer: Self-pay

## 2024-05-20 NOTE — Progress Notes (Signed)
 Visit 11/11 was done virtually; updated and sent for cosign

## 2024-05-25 ENCOUNTER — Ambulatory Visit (INDEPENDENT_AMBULATORY_CARE_PROVIDER_SITE_OTHER): Admitting: Primary Care

## 2024-05-25 ENCOUNTER — Encounter (INDEPENDENT_AMBULATORY_CARE_PROVIDER_SITE_OTHER): Payer: Self-pay | Admitting: Primary Care

## 2024-05-25 VITALS — BP 119/81 | HR 85 | Resp 16 | Wt 115.8 lb

## 2024-05-25 DIAGNOSIS — F5102 Adjustment insomnia: Secondary | ICD-10-CM | POA: Diagnosis not present

## 2024-05-25 DIAGNOSIS — Z7984 Long term (current) use of oral hypoglycemic drugs: Secondary | ICD-10-CM | POA: Diagnosis not present

## 2024-05-25 DIAGNOSIS — E119 Type 2 diabetes mellitus without complications: Secondary | ICD-10-CM | POA: Diagnosis not present

## 2024-05-25 DIAGNOSIS — Z09 Encounter for follow-up examination after completed treatment for conditions other than malignant neoplasm: Secondary | ICD-10-CM

## 2024-05-25 DIAGNOSIS — Z23 Encounter for immunization: Secondary | ICD-10-CM | POA: Diagnosis not present

## 2024-05-25 LAB — GLUCOSE, POCT (MANUAL RESULT ENTRY): POC Glucose: 172 mg/dL — AB (ref 70–99)

## 2024-05-25 MED ORDER — JANUVIA 25 MG PO TABS: 25.0000 mg | ORAL_TABLET | Freq: Every day | ORAL | 1 refills | Status: AC

## 2024-05-25 MED ORDER — TRAZODONE HCL 50 MG PO TABS: 25.0000 mg | ORAL_TABLET | Freq: Every evening | ORAL | 1 refills | Status: AC | PRN

## 2024-05-25 NOTE — Progress Notes (Signed)
 Subjective:   [name] is a 68 y.o. female presents for ED up . On  05/16/24, Diarrhea, unspecified type, Hypokalemia , Hypomagnesemia Patient complains of profuse loose diarrheal stool since Tuesday. Symptoms have been persistent for the last 4 days. She reports intermittent nausea and some mild vomiting.  Past Medical History:  Diagnosis Date   Anemia    hx   Aneurysm    small, on left side of brain    Anxiety    hx of   Arthritis    generalized   CAD (coronary artery disease)    CHF (congestive heart failure) (HCC) 2025   Diabetes mellitus without complication (HCC)    diet control, pt denies- on medications   GERD (gastroesophageal reflux disease)    iwht certain foods/on meds/OTC PRN meds   Heart failure with mildly reduced ejection fraction (HFmrEF) (HCC)    Hyperlipidemia    on meds   Hypertension    on meds   TIA (transient ischemic attack)    pt unaware of this hx on 06/19/2017     No Known Allergies  Current Outpatient Medications on File Prior to Visit  Medication Sig Dispense Refill   Accu-Chek Softclix Lancets lancets Use to check blood sugar once daily. 100 each 2   amLODipine  (NORVASC ) 5 MG tablet Take 1 tablet (5 mg total) by mouth daily. 90 tablet 3   aspirin  EC 81 MG tablet Take 1 tablet (81 mg total) by mouth daily.     Blood Glucose Monitoring Suppl (ACCU-CHEK GUIDE ME) w/Device KIT USE TO CHECK BLOOD SUGAR ONCE DAILY 1 kit 0   clopidogrel  (PLAVIX ) 75 MG tablet Take 1 tablet (75 mg total) by mouth daily. 90 tablet 3   dapagliflozin  propanediol (FARXIGA ) 10 MG TABS tablet Take 1 tablet (10 mg total) by mouth daily before breakfast. 90 tablet 3   dicyclomine  (BENTYL ) 10 MG capsule Take 1 capsule (10 mg total) by mouth 3 (three) times daily before meals. 90 capsule 1   Evolocumab  (REPATHA  SURECLICK) 140 MG/ML SOAJ Inject 140 mg into the skin every 14 (fourteen) days. 6 mL 3   ezetimibe  (ZETIA ) 10 MG tablet Take 1 tablet (10 mg total) by mouth  daily. 90 tablet 3   furosemide  (LASIX ) 20 MG tablet Take 1 tablet (20 mg total) by mouth daily as needed. 90 tablet 3   glucose blood (ACCU-CHEK GUIDE TEST) test strip Use to check blood sugar once daily. 100 each 2   losartan -hydrochlorothiazide  (HYZAAR ) 100-25 MG tablet Take 1 tablet by mouth daily. 90 tablet 3   metoprolol  tartrate (LOPRESSOR ) 100 MG tablet Take 1 tablet (100 mg total) by mouth in the morning AND 1.5 tablets (150 mg total) every evening. 225 tablet 3   ondansetron  (ZOFRAN -ODT) 4 MG disintegrating tablet Take 1 tablet (4 mg total) by mouth every 8 (eight) hours as needed. 12 tablet 0   pantoprazole  (PROTONIX ) 40 MG tablet Take 1 tablet (40 mg total) by mouth daily. 90 tablet 3   Potassium Chloride  ER 20 MEQ TBCR Take 1 tablet (20 mEq total) by mouth daily as needed when taking furosemide  (lasix ). 90 tablet 3   potassium chloride  SA (KLOR-CON  M) 20 MEQ tablet Take 1 tablet (20 mEq total) by mouth daily as needed (when taking furosemide  (lasix )). 90 tablet 3   pregabalin (LYRICA) 50 MG capsule Take 50 mg by mouth 3 (three) times daily.     rosuvastatin  (CRESTOR ) 40 MG tablet Take 1 tablet (40 mg  total) by mouth daily. 90 tablet 3   triamcinolone  0.1%-silver sulfadiazine 1:1 cream mixture Apply topically daily. 16 g 0   No current facility-administered medications on file prior to visit.    Review of System: ROS Comprehensive ROS Pertinent positive and negative noted in HPI   Objective:  BP 119/81   Pulse 85   Resp 16   Wt 115 lb 12.8 oz (52.5 kg)   SpO2 98%   BMI 21.88 kg/m   Physical Exam Vitals reviewed.  Constitutional:      Appearance: Normal appearance.  HENT:     Head: Normocephalic.     Right Ear: Tympanic membrane, ear canal and external ear normal.     Left Ear: Tympanic membrane, ear canal and external ear normal.     Nose: Nose normal.     Mouth/Throat:     Mouth: Mucous membranes are moist.  Eyes:     Extraocular Movements: Extraocular movements  intact.     Pupils: Pupils are equal, round, and reactive to light.  Cardiovascular:     Rate and Rhythm: Normal rate and regular rhythm.  Pulmonary:     Effort: Pulmonary effort is normal.     Breath sounds: Normal breath sounds.  Abdominal:     General: Bowel sounds are normal.     Palpations: Abdomen is soft.  Musculoskeletal:        General: Normal range of motion.     Cervical back: Normal range of motion.  Skin:    General: Skin is warm and dry.  Neurological:     Mental Status: She is alert and oriented to person, place, and time.  Psychiatric:        Mood and Affect: Mood normal.        Behavior: Behavior normal.        Thought Content: Thought content normal.      Assessment:  [name] was seen today for hospitalization follow-up.  Diagnoses and all orders for this visit:  Diabetes mellitus type 2 in nonobese (HCC) -     Microalbumin / creatinine urine ratio -     POCT glucose (manual entry) -     JANUVIA  25 MG tablet; Take 1 tablet (25 mg total) by mouth daily.  Adjustment insomnia -     traZODone  (DESYREL ) 50 MG tablet; Take 0.5-1 tablets (25-50 mg total) by mouth at bedtime as needed for sleep.  Hospital discharge follow-up See HPI  Encounter for immunization -     Flu vaccine HIGH DOSE PF(Fluzone Trivalent)     This note has been created with Education officer, environmental. Any transcriptional errors are unintentional.   Return for 6-8 weeks labs.  Marcia SHAUNNA Bohr, NP 05/25/2024, 4:51 PM

## 2024-05-25 NOTE — Patient Instructions (Signed)

## 2024-05-25 NOTE — Progress Notes (Deleted)
 [facility]  [name], is a 68 y.o. female  RDW:[id number]  FMW:[id number]  DOB - 12-03-1955  No chief complaint on file.      Subjective:   [name] is a 68 y.o. female here today for a follow up visit. Patient has No headache, No chest pain, No abdominal pain - No Nausea, No new weakness tingling or numbness, No Cough - shortness of breath HPI  No problems updated.  Comprehensive ROS Pertinent positive and negative noted in HPI   No Known Allergies  Past Medical History:  Diagnosis Date   Anemia    hx   Aneurysm    small, on left side of brain    Anxiety    hx of   Arthritis    generalized   CAD (coronary artery disease)    CHF (congestive heart failure) (HCC) 2025   Diabetes mellitus without complication (HCC)    diet control, pt denies- on medications   GERD (gastroesophageal reflux disease)    iwht certain foods/on meds/OTC PRN meds   Heart failure with mildly reduced ejection fraction (HFmrEF) (HCC)    Hyperlipidemia    on meds   Hypertension    on meds   TIA (transient ischemic attack)    pt unaware of this hx on 06/19/2017    Current Outpatient Medications on File Prior to Visit  Medication Sig Dispense Refill   Accu-Chek Softclix Lancets lancets Use to check blood sugar once daily. 100 each 2   amLODipine  (NORVASC ) 5 MG tablet Take 1 tablet (5 mg total) by mouth daily. 90 tablet 3   aspirin  EC 81 MG tablet Take 1 tablet (81 mg total) by mouth daily.     Blood Glucose Monitoring Suppl (ACCU-CHEK GUIDE ME) w/Device KIT USE TO CHECK BLOOD SUGAR ONCE DAILY 1 kit 0   clopidogrel  (PLAVIX ) 75 MG tablet Take 1 tablet (75 mg total) by mouth daily. 90 tablet 3   dapagliflozin  propanediol (FARXIGA ) 10 MG TABS tablet Take 1 tablet (10 mg total) by mouth daily before breakfast. 90 tablet 3   dicyclomine  (BENTYL ) 10 MG capsule Take 1 capsule (10 mg total) by mouth 3 (three) times daily before meals. 90 capsule 1   Evolocumab  (REPATHA   SURECLICK) 140 MG/ML SOAJ Inject 140 mg into the skin every 14 (fourteen) days. 6 mL 3   ezetimibe  (ZETIA ) 10 MG tablet Take 1 tablet (10 mg total) by mouth daily. 90 tablet 3   fluconazole  (DIFLUCAN ) 150 MG tablet Take 1 tablet (150 mg total) by mouth daily. (Patient not taking: Reported on 05/14/2024) 1 tablet 1   furosemide  (LASIX ) 20 MG tablet Take 1 tablet (20 mg total) by mouth daily as needed. 90 tablet 3   glucose blood (ACCU-CHEK GUIDE TEST) test strip Use to check blood sugar once daily. 100 each 2   JANUVIA  25 MG tablet Take 25 mg by mouth daily.     losartan -hydrochlorothiazide  (HYZAAR ) 100-25 MG tablet Take 1 tablet by mouth daily. 90 tablet 3   metoprolol  tartrate (LOPRESSOR ) 100 MG tablet Take 1 tablet (100 mg total) by mouth in the morning AND 1.5 tablets (150 mg total) every evening. 225 tablet 3   ondansetron  (ZOFRAN -ODT) 4 MG disintegrating tablet Take 1 tablet (4 mg total) by mouth every 8 (eight) hours as needed. 12 tablet 0   pantoprazole  (PROTONIX ) 40 MG tablet Take 1 tablet (40 mg total) by mouth daily. 90 tablet 3   Potassium Chloride  ER 20 MEQ TBCR Take 1  tablet (20 mEq total) by mouth daily as needed when taking furosemide  (lasix ). 90 tablet 3   potassium chloride  SA (KLOR-CON  M) 20 MEQ tablet Take 1 tablet (20 mEq total) by mouth daily as needed (when taking furosemide  (lasix )). 90 tablet 3   pregabalin (LYRICA) 50 MG capsule Take 50 mg by mouth 3 (three) times daily.     rosuvastatin  (CRESTOR ) 40 MG tablet Take 1 tablet (40 mg total) by mouth daily. 90 tablet 3   traZODone  (DESYREL ) 50 MG tablet Take 0.5-1 tablets (25-50 mg total) by mouth at bedtime as needed for sleep. 30 tablet 1   triamcinolone  0.1%-silver sulfadiazine 1:1 cream mixture Apply topically daily. 16 g 0   No current facility-administered medications on file prior to visit.   Health Maintenance  Topic Date Due   Eye exam for diabetics  07/25/2018   COVID-19 Vaccine (3 - Pfizer risk series) 03/04/2020    Zoster (Shingles) Vaccine (2 of 2) 06/27/2023   Flu Shot  01/24/2024   Yearly kidney health urinalysis for diabetes  05/01/2024   Complete foot exam   06/11/2024   Hemoglobin A1C  08/21/2024   Osteoporosis screening with Bone Density Scan  11/12/2024   Breast Cancer Screening  01/30/2025   Medicare Annual Wellness Visit  05/05/2025   Yearly kidney function blood test for diabetes  05/16/2025   Colon Cancer Screening  02/13/2028   DTaP/Tdap/Td vaccine (3 - Td or Tdap) 04/26/2034   Pneumococcal Vaccine for age over 40  Completed   Hepatitis C Screening  Completed   Meningitis B Vaccine  Aged Out    Objective:  There were no vitals filed for this visit. {Vitals History (Optional):23777}  Physical Exam Vitals reviewed.  Constitutional:      Appearance: Normal appearance.  HENT:     Head: Normocephalic.     Right Ear: Tympanic membrane, ear canal and external ear normal.     Left Ear: Tympanic membrane, ear canal and external ear normal.     Nose: Nose normal.     Mouth/Throat:     Mouth: Mucous membranes are moist.  Eyes:     Extraocular Movements: Extraocular movements intact.     Pupils: Pupils are equal, round, and reactive to light.  Cardiovascular:     Rate and Rhythm: Normal rate.  Pulmonary:     Effort: Pulmonary effort is normal.     Breath sounds: Normal breath sounds.  Abdominal:     General: Bowel sounds are normal.     Palpations: Abdomen is soft.  Musculoskeletal:        General: Normal range of motion.     Cervical back: Normal range of motion.  Skin:    General: Skin is warm and dry.  Neurological:     Mental Status: She is alert and oriented to person, place, and time.  Psychiatric:        Mood and Affect: Mood normal.        Behavior: Behavior normal.        Thought Content: Thought content normal.     {Labs (Optional):23779}  Assessment & Plan  There are no diagnoses linked to this encounter.   Patient have been counseled extensively about  nutrition and exercise. Other issues discussed during this visit include: low cholesterol diet, weight control and daily exercise, foot care, annual eye examinations at Ophthalmology, importance of adherence with medications and regular follow-up. We also discussed long term complications of uncontrolled diabetes and hypertension.   No  follow-ups on file.  The patient was given clear instructions to go to ER or return to medical center if symptoms don't improve, worsen or new problems develop. The patient verbalized understanding. The patient was told to call to get lab results if they haven't heard anything in the next week.   This note has been created with Education officer, environmental. Any transcriptional errors are unintentional.   [name], NP 05/25/2024, 8:44 AM

## 2024-05-27 LAB — MICROALBUMIN / CREATININE URINE RATIO
Creatinine, Urine: 75.3 mg/dL
Microalb/Creat Ratio: 7 mg/g{creat} (ref 0–29)
Microalbumin, Urine: 5.6 ug/mL

## 2024-05-28 ENCOUNTER — Ambulatory Visit (INDEPENDENT_AMBULATORY_CARE_PROVIDER_SITE_OTHER): Payer: Self-pay | Admitting: Primary Care

## 2024-05-28 ENCOUNTER — Other Ambulatory Visit (HOSPITAL_COMMUNITY): Payer: Self-pay

## 2024-06-10 ENCOUNTER — Other Ambulatory Visit: Payer: Self-pay | Admitting: Pharmacist

## 2024-06-10 ENCOUNTER — Other Ambulatory Visit (HOSPITAL_COMMUNITY): Payer: Self-pay

## 2024-06-10 DIAGNOSIS — I10 Essential (primary) hypertension: Secondary | ICD-10-CM

## 2024-06-10 DIAGNOSIS — Z760 Encounter for issue of repeat prescription: Secondary | ICD-10-CM

## 2024-06-10 MED ORDER — LOSARTAN POTASSIUM-HCTZ 100-25 MG PO TABS
1.0000 | ORAL_TABLET | Freq: Every day | ORAL | 3 refills | Status: AC
  Filled 2024-06-10: qty 90, 90d supply, fill #0

## 2024-06-10 NOTE — Progress Notes (Signed)
 Pharmacy Quality Measure Review  This patient is appearing on a report for being at risk of failing the adherence measure for hypertension (ACEi/ARB) medications this calendar year.   Medication: losartan -hydrochlorothiazide  Last fill date: 03/12/2024 for 90 day supply per pharmacy.   I contacted patient and she prefers to fill at [facility]. I sent her rxn there and she will pick-up tomorrow.   Herlene Fleeta Morris, PharmD, JAQUELINE, CPP Clinical Pharmacist [facility] [phone]

## 2024-06-29 ENCOUNTER — Telehealth: Payer: Self-pay | Admitting: Cardiology

## 2024-06-29 NOTE — Telephone Encounter (Signed)
 Patient wants to get a letter for her landlord so she can move to a lower apartment due to her SOB.

## 2024-06-29 NOTE — Telephone Encounter (Signed)
 Spoke with pt, she reports there are 10 steps to get to her home and she noticed this weekend that she got SOB while climbing the stairs. This is the first time she has noticed the SOB and only has it when climbing the stairs. She denies any edema and does not weigh as she was instructed. She reports taking her medications as prescribed. It is time for her to redo her lease and wanted to know if she could get a letter to see if she can move to a lower level. Aware dr michele is not in the office but will forward for his review.

## 2024-06-29 NOTE — Telephone Encounter (Signed)
 Its a reasonable to consider lower level.  I would recommend that she first reaches out to her PCP - for the letter.  PCP can provide her a more comprehensive letter keep all her cardiac and non-cardiac co morbidities in mind.  IF she has any issues have her reach out to us .   Dr. Michele

## 2024-06-29 NOTE — Telephone Encounter (Signed)
 Spoke with pt regarding Dr. Tyree suggestions. Pt verbalized understanding. All questions if any were answered.

## 2024-07-01 ENCOUNTER — Telehealth: Payer: Self-pay | Admitting: Cardiology

## 2024-07-01 NOTE — Telephone Encounter (Signed)
 Pt would like to know how soon after heart surgery she will be able to have her cataract surgery. Please advise.

## 2024-07-01 NOTE — Telephone Encounter (Signed)
 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 Needed catarct surgery prior but had to put off due to heart surgery. Pt asking how long before she can have the cataract surgery, since it has been a year. Will send to Dr Michele for review. Pt verbalizes understanding of plan.

## 2024-07-03 ENCOUNTER — Telehealth (INDEPENDENT_AMBULATORY_CARE_PROVIDER_SITE_OTHER): Payer: Self-pay | Admitting: Primary Care

## 2024-07-03 NOTE — Telephone Encounter (Signed)
 Spoke to pt about upcoming appt.. Will be present. Pt also explaining that she would need for me to set her up some transportation.

## 2024-07-06 ENCOUNTER — Other Ambulatory Visit (HOSPITAL_COMMUNITY): Payer: Self-pay

## 2024-07-06 ENCOUNTER — Ambulatory Visit (INDEPENDENT_AMBULATORY_CARE_PROVIDER_SITE_OTHER): Admitting: Primary Care

## 2024-07-07 NOTE — Telephone Encounter (Signed)
 Spoke with pt. Recommendations given. Pt stated understanding.

## 2024-07-07 NOTE — Telephone Encounter (Signed)
 She can proceed forward with her cataract surgery.   Dr. Xareni Kelch

## 2024-07-08 NOTE — Telephone Encounter (Signed)
 Copied from CRM #[id number]. Topic: General - Other >> Jul 08, 2024  9:51 AM Myrick T wrote: Reason for CRM: patient said she was returning [facility] call. Please f/u with patient

## 2024-07-17 ENCOUNTER — Other Ambulatory Visit (HOSPITAL_COMMUNITY): Payer: Self-pay

## 2024-07-27 ENCOUNTER — Other Ambulatory Visit (HOSPITAL_COMMUNITY): Payer: Self-pay

## 2024-08-04 ENCOUNTER — Ambulatory Visit: Admitting: Thoracic Surgery (Cardiothoracic Vascular Surgery)
